# Patient Record
Sex: Male | Born: 1945 | Race: White | Hispanic: No | Smoking: Former smoker
Health system: Southern US, Community
[De-identification: ages and names within clinical notes are randomized; demographics above are authoritative.]

## PROBLEM LIST (undated history)

## (undated) DIAGNOSIS — I4891 Unspecified atrial fibrillation: Secondary | ICD-10-CM

## (undated) DIAGNOSIS — E785 Hyperlipidemia, unspecified: Secondary | ICD-10-CM

## (undated) DIAGNOSIS — H539 Unspecified visual disturbance: Secondary | ICD-10-CM

## (undated) DIAGNOSIS — Z951 Presence of aortocoronary bypass graft: Secondary | ICD-10-CM

## (undated) DIAGNOSIS — H349 Unspecified retinal vascular occlusion: Secondary | ICD-10-CM

## (undated) DIAGNOSIS — I69398 Other sequelae of cerebral infarction: Secondary | ICD-10-CM

## (undated) DIAGNOSIS — I5032 Chronic diastolic (congestive) heart failure: Secondary | ICD-10-CM

## (undated) DIAGNOSIS — I480 Paroxysmal atrial fibrillation: Secondary | ICD-10-CM

## (undated) DIAGNOSIS — I10 Essential (primary) hypertension: Secondary | ICD-10-CM

## (undated) DIAGNOSIS — E119 Type 2 diabetes mellitus without complications: Secondary | ICD-10-CM

## (undated) DIAGNOSIS — G4730 Sleep apnea, unspecified: Secondary | ICD-10-CM

## (undated) DIAGNOSIS — R42 Dizziness and giddiness: Secondary | ICD-10-CM

## (undated) DIAGNOSIS — Z87442 Personal history of urinary calculi: Secondary | ICD-10-CM

## (undated) DIAGNOSIS — Z972 Presence of dental prosthetic device (complete) (partial): Secondary | ICD-10-CM

## (undated) DIAGNOSIS — Z952 Presence of prosthetic heart valve: Secondary | ICD-10-CM

## (undated) DIAGNOSIS — I2510 Atherosclerotic heart disease of native coronary artery without angina pectoris: Secondary | ICD-10-CM

## (undated) HISTORY — PX: CARDIAC VALVE REPLACEMENT: SHX585

## (undated) HISTORY — DX: Paroxysmal atrial fibrillation: I48.0

## (undated) HISTORY — PX: APPENDECTOMY: SHX54

## (undated) HISTORY — PX: TONSILLECTOMY: SUR1361

## (undated) HISTORY — DX: Hyperlipidemia, unspecified: E78.5

## (undated) HISTORY — DX: Unspecified atrial fibrillation: I48.91

## (undated) HISTORY — DX: Chronic diastolic (congestive) heart failure: I50.32

## (undated) HISTORY — PX: BONE MARROW BIOPSY: SHX1253

---

## 2009-05-16 DIAGNOSIS — I10 Essential (primary) hypertension: Secondary | ICD-10-CM | POA: Insufficient documentation

## 2014-01-17 ENCOUNTER — Ambulatory Visit: Payer: Self-pay | Admitting: Family Medicine

## 2014-04-07 DIAGNOSIS — G4733 Obstructive sleep apnea (adult) (pediatric): Secondary | ICD-10-CM | POA: Insufficient documentation

## 2014-04-07 DIAGNOSIS — N400 Enlarged prostate without lower urinary tract symptoms: Secondary | ICD-10-CM | POA: Insufficient documentation

## 2015-03-28 DIAGNOSIS — E119 Type 2 diabetes mellitus without complications: Secondary | ICD-10-CM | POA: Insufficient documentation

## 2015-07-15 HISTORY — PX: CORONARY ARTERY BYPASS GRAFT: SHX141

## 2015-07-15 HISTORY — PX: AORTIC VALVE REPLACEMENT: SHX41

## 2015-07-31 DIAGNOSIS — I471 Supraventricular tachycardia: Secondary | ICD-10-CM | POA: Insufficient documentation

## 2016-02-11 DIAGNOSIS — I25118 Atherosclerotic heart disease of native coronary artery with other forms of angina pectoris: Secondary | ICD-10-CM | POA: Insufficient documentation

## 2016-08-07 DIAGNOSIS — Z952 Presence of prosthetic heart valve: Secondary | ICD-10-CM | POA: Insufficient documentation

## 2016-08-07 DIAGNOSIS — Z953 Presence of xenogenic heart valve: Secondary | ICD-10-CM | POA: Insufficient documentation

## 2016-08-07 DIAGNOSIS — Z951 Presence of aortocoronary bypass graft: Secondary | ICD-10-CM | POA: Insufficient documentation

## 2018-03-24 DIAGNOSIS — Z6838 Body mass index (BMI) 38.0-38.9, adult: Secondary | ICD-10-CM | POA: Insufficient documentation

## 2019-04-17 ENCOUNTER — Other Ambulatory Visit: Payer: Self-pay

## 2019-04-17 ENCOUNTER — Encounter: Payer: Self-pay | Admitting: Emergency Medicine

## 2019-04-17 ENCOUNTER — Emergency Department: Payer: Medicare HMO

## 2019-04-17 ENCOUNTER — Emergency Department
Admission: EM | Admit: 2019-04-17 | Discharge: 2019-04-17 | Disposition: A | Discharge status: Home / Self Care | Payer: Medicare HMO | Source: Home / Self Care | Attending: Emergency Medicine | Admitting: Emergency Medicine

## 2019-04-17 DIAGNOSIS — I2510 Atherosclerotic heart disease of native coronary artery without angina pectoris: Secondary | ICD-10-CM | POA: Insufficient documentation

## 2019-04-17 DIAGNOSIS — A4181 Sepsis due to Enterococcus: Secondary | ICD-10-CM | POA: Diagnosis not present

## 2019-04-17 DIAGNOSIS — Z79899 Other long term (current) drug therapy: Secondary | ICD-10-CM | POA: Insufficient documentation

## 2019-04-17 DIAGNOSIS — Z794 Long term (current) use of insulin: Secondary | ICD-10-CM | POA: Insufficient documentation

## 2019-04-17 DIAGNOSIS — R7881 Bacteremia: Secondary | ICD-10-CM | POA: Diagnosis not present

## 2019-04-17 DIAGNOSIS — Z7982 Long term (current) use of aspirin: Secondary | ICD-10-CM | POA: Insufficient documentation

## 2019-04-17 DIAGNOSIS — J111 Influenza due to unidentified influenza virus with other respiratory manifestations: Secondary | ICD-10-CM | POA: Insufficient documentation

## 2019-04-17 DIAGNOSIS — I10 Essential (primary) hypertension: Secondary | ICD-10-CM | POA: Insufficient documentation

## 2019-04-17 DIAGNOSIS — E119 Type 2 diabetes mellitus without complications: Secondary | ICD-10-CM | POA: Insufficient documentation

## 2019-04-17 DIAGNOSIS — Z20828 Contact with and (suspected) exposure to other viral communicable diseases: Secondary | ICD-10-CM | POA: Insufficient documentation

## 2019-04-17 HISTORY — DX: Essential (primary) hypertension: I10

## 2019-04-17 HISTORY — DX: Atherosclerotic heart disease of native coronary artery without angina pectoris: I25.10

## 2019-04-17 LAB — URINALYSIS, ROUTINE W REFLEX MICROSCOPIC
Bilirubin Urine: NEGATIVE
Glucose, UA: NEGATIVE mg/dL
Ketones, ur: NEGATIVE mg/dL
Nitrite: NEGATIVE
Protein, ur: 30 mg/dL — AB
Specific Gravity, Urine: 1.025 (ref 1.005–1.030)
WBC, UA: >50 WBC/hpf — ABNORMAL HIGH (ref 0–5)
pH: 5 (ref 5.0–8.0)

## 2019-04-17 LAB — CBC WITH DIFFERENTIAL/PLATELET
Abs Immature Granulocytes: 0.05 10*3/uL (ref 0.00–0.07)
Basophils Absolute: 0 10*3/uL (ref 0.0–0.1)
Basophils Relative: 1 %
Eosinophils Absolute: 0 10*3/uL (ref 0.0–0.5)
Eosinophils Relative: 0 %
HCT: 36 % — ABNORMAL LOW (ref 39.0–52.0)
Hemoglobin: 12 g/dL — ABNORMAL LOW (ref 13.0–17.0)
Immature Granulocytes: 1 %
Lymphocytes Relative: 26 %
Lymphs Abs: 1.7 10*3/uL (ref 0.7–4.0)
MCH: 34.2 pg — ABNORMAL HIGH (ref 26.0–34.0)
MCHC: 33.3 g/dL (ref 30.0–36.0)
MCV: 102.6 fL — ABNORMAL HIGH (ref 80.0–100.0)
Monocytes Absolute: 0.9 10*3/uL (ref 0.1–1.0)
Monocytes Relative: 15 %
Neutro Abs: 3.7 10*3/uL (ref 1.7–7.7)
Neutrophils Relative %: 57 %
Platelets: 152 10*3/uL (ref 150–400)
RBC: 3.51 MIL/uL — ABNORMAL LOW (ref 4.22–5.81)
RDW: 14.3 % (ref 11.5–15.5)
WBC: 6.4 10*3/uL (ref 4.0–10.5)
nRBC: 0 % (ref 0.0–0.2)

## 2019-04-17 LAB — PROTIME-INR
INR: 1.2 (ref 0.8–1.2)
Prothrombin Time: 14.6 seconds (ref 11.4–15.2)

## 2019-04-17 LAB — APTT: aPTT: 25 seconds (ref 24–36)

## 2019-04-17 LAB — COMPREHENSIVE METABOLIC PANEL
ALT: 33 U/L (ref 0–44)
AST: 40 U/L (ref 15–41)
Albumin: 3.6 g/dL (ref 3.5–5.0)
Alkaline Phosphatase: 49 U/L (ref 38–126)
Anion gap: 12 (ref 5–15)
BUN: 14 mg/dL (ref 8–23)
CO2: 22 mmol/L (ref 22–32)
Calcium: 8.6 mg/dL — ABNORMAL LOW (ref 8.9–10.3)
Chloride: 100 mmol/L (ref 98–111)
Creatinine, Ser: 1.02 mg/dL (ref 0.61–1.24)
GFR calc Af Amer: >60 mL/min (ref >60)
GFR calc non Af Amer: >60 mL/min (ref >60)
Glucose, Bld: 157 mg/dL — ABNORMAL HIGH (ref 70–99)
Potassium: 3.6 mmol/L (ref 3.5–5.1)
Sodium: 134 mmol/L — ABNORMAL LOW (ref 135–145)
Total Bilirubin: 0.9 mg/dL (ref 0.3–1.2)
Total Protein: 7.5 g/dL (ref 6.5–8.1)

## 2019-04-17 LAB — SARS CORONAVIRUS 2 BY RT PCR (HOSPITAL ORDER, PERFORMED IN ~~LOC~~ HOSPITAL LAB): SARS Coronavirus 2: NEGATIVE

## 2019-04-17 LAB — LACTIC ACID, PLASMA: Lactic Acid, Venous: 1.7 mmol/L (ref 0.5–1.9)

## 2019-04-17 MED ORDER — SODIUM CHLORIDE 0.9 % IV SOLN
2.0000 g | INTRAVENOUS | Status: DC
  Administered 2019-04-17: 2 g via INTRAVENOUS
  Filled 2019-04-17: qty 20

## 2019-04-17 MED ORDER — SODIUM CHLORIDE 0.9 % IV SOLN
500.0000 mg | INTRAVENOUS | Status: DC
  Administered 2019-04-17: 17:00:00 500 mg via INTRAVENOUS
  Filled 2019-04-17: qty 500

## 2019-04-17 MED ORDER — IOHEXOL 300 MG/ML  SOLN
125.0000 mL | Freq: Once | INTRAMUSCULAR | Status: AC | PRN
  Administered 2019-04-17: 20:00:00 125 mL via INTRAVENOUS

## 2019-04-17 MED ORDER — AZITHROMYCIN 250 MG PO TABS: ORAL_TABLET | ORAL | 0 refills | Status: DC

## 2019-04-17 NOTE — ED Triage Notes (Signed)
Pt presents from home via [facility] with c/o generalized weakness and fever for 3 days. Pt has non-productive cough. Pt sinus tach on monitor for ems at 125. temp 101.5 for ems. Pt alert and oriented x4 at this time.

## 2019-04-17 NOTE — Progress Notes (Signed)
CODE SEPSIS - PHARMACY COMMUNICATION  **Broad Spectrum Antibiotics should be administered within 1 hour of Sepsis diagnosis**  Time Code Sepsis Called/Page Received: 15:28  Antibiotics Ordered: Ceftriaxone and Azithromycin  Time of 1st antibiotic administration: Ceftriaxone given at 15:45  Additional action taken by pharmacy: n/a  If necessary, Name of Provider/Nurse Contacted: n/a    Vira Blanco ,PharmD Clinical Pharmacist  04/17/2019  4:20 PM

## 2019-04-17 NOTE — Progress Notes (Signed)
Notified bedside nurse of need to draw lactic acid and blood cultures.  

## 2019-04-17 NOTE — ED Provider Notes (Addendum)
[facility] Emergency Department Provider Note  ____________________________________________  Time seen: Approximately 4:48 PM  I have reviewed the triage vital signs and the nursing notes.   HISTORY  Chief Complaint Fever and Weakness    HPI Jonathan Hailey. is a 73 y.o. male with a history of hypertension diabetes and CAD who comes to the ED today complaining of generalized weakness, fever, shortness of breath, nonproductive cough that is gradual onset constant and worsening for the past 4 days.  Associated body aches and malaise and decreased appetite.  No known sick contacts, denies possible exposure to [place] but does go to church twice a week without wearing a mask.   EMS report a temperature of 101.5.   aspirin (ADULT LOW DOSE ASPIRIN) 81 MG tablet  Take 1 tablet (81 mg total) by mouth daily. 60 tablet  6 05/30/2014  Active  insulin syringe-needle U-100 (BD INSULIN SYRINGE ULTRA-FINE) 1/2 mL 30 gauge x 1/2" Syrg  Use to inject insulin twice daily. Please dispense 50 unit syringes. ICD-10 E11.9 100 each  7 04/02/2016  Active  ACCU-CHEK AVIVA PLUS METER Misc  CHECK BLOOD SUGAR THREE TIMES DAILY  1 each  0 01/08/2017  Active  ACCU-CHEK SOFTCLIX LANCETS lancets  Indications: Type 2 diabetes mellitus without complication, without long-term current use of insulin (CMS-HCC) TEST BLOOD SUGAR THREE TIMES DAILY 300 each  11 08/11/2017  Active  omeprazole (PRILOSEC) 20 MG capsule  Take 1 capsule (20 mg total) by mouth daily. 90 capsule  3 04/22/2018 04/22/2019 Active  econazole nitrate (SPECTAZOLE) 1 % cream  Indications: Tinea corporis Apply topically daily. Apply a thin layer twice a day to affected area in groin. Rub in totally. 85 g  6 05/03/2018  Active  nitroglycerin (NITROSTAT) 0.4 MG SL tablet  Place 1 tablet (0.4 mg total) under the tongue every five (5) minutes as needed for chest pain. 30 tablet  0 06/17/2018 06/17/2019 Active    atorvastatin (LIPITOR) 80 MG tablet  Take 1 tablet (80 mg total) by mouth daily. 90 tablet  1 08/13/2018  Active  loratadine (CLARITIN) 10 mg tablet  Indications: Tinea corporis Take 1 tablet (10 mg total) by mouth daily. 90 tablet  3 09/13/2018 09/13/2019 Active  isosorbide mononitrate (IMDUR) 30 MG 24 hr tablet  Take 1 tablet (30 mg total) by mouth daily. 90 tablet  3 09/13/2018 09/13/2019 Active  nystatin (MYCOSTATIN) 100,000 unit/gram powder  Apply 1 application topically.  0   Active  blood sugar diagnostic (ACCU-CHEK AVIVA PLUS TEST STRP) Strp  Test 3 times a day 300 strip  3 10/25/2018  Active  amLODIPine (NORVASC) 5 MG tablet  Take 1 tablet (5 mg total) by mouth daily. 30 tablet  8 12/08/2018  Active  metoprolol succinate (TOPROL-XL) 25 MG 24 hr tablet  Take 1 tablet (25 mg total) by mouth daily. 90 tablet  2 12/08/2018 09/04/2019 Active  lisinopriL (PRINIVIL,ZESTRIL) 40 MG tablet  Take 1 tablet (40 mg total) by mouth daily. 90 tablet  2 12/08/2018 09/04/2019 Active  clotrimazole-betamethasone (LOTRISONE) 1-0.05 % cream  Indications: Skin lesion of foot Apply to affected area 2 times daily for 14 days. 15 g  1 12/13/2018 12/13/2019 Active  insulin NPH-insulin regular, 70/30, (HUMULIN/NOVOLIN) 100 unit/mL (70-30) injection  Inject 85 units qam, 80u qpm 70 mL   12/20/2018  Active  empagliflozin (JARDIANCE) 10 mg tablet  Take 1 tablet (10 mg total) by mouth daily. 30 tablet  2 04/14/2019  Active  Hospital, Clinic, or Other Facility Administered Medication Ordered Dose Route Frequency Start Date End Date Status  nitroglycerin (NITROSTAT) SL tablet 0.4 mg  Indications: Chest pain, unspecified type 0.4 mg SL Once 03/24/2018  Active   Active Problems Reconcile with Patient's Chart Problem Noted Date  Class 2 severe obesity due to excess calories with serious comorbidity and body mass index (BMI) of 38.0 to 38.9 in adult 03/24/2018  Other chest pain 09/23/2017   Status post coronary artery bypass graft 08/07/2016  Overview:   From op note, 02/11/2016:  OPERATION PERFORMED:  1. Aortic valve replacement with a 23 mm bovine pericardial valve. 2. Coronary Artery Bypass Grafting x 2 (LIMA to LAD, RSVG to OM). 3. Endoscopic vein harvest of the left leg.  SURGEON: Shon Baton, MD.  ASSISSTANT SURGEON: Concepcion Living, MD (No qualified resident available).  PA ASSISTANT (Endovein) : Tatjana Misunina, PA-C (No qualified resident available).    Status post aortic valve replacement 08/07/2016  Overview:   Summer, 2017 (from op note)  OPERATION PERFORMED:  1. Aortic valve replacement with a 23 mm bovine pericardial valve. 2. Coronary Artery Bypass Grafting x 2 (LIMA to LAD, RSVG to OM). 3. Endoscopic vein harvest of the left leg.  SURGEON: Shon Baton, MD.  ASSISSTANT SURGEON: Concepcion Living, MD (No qualified resident available).  PA ASSISTANT (Endovein) : Tatjana Misunina, PA-C (No qualified resident available).    CAD (coronary artery disease) 02/11/2016  Overview:   Left heart catheterization 09/23/17 1. Significant 3-vesselcoronary artery disease. There is 80% stenosis of the ostial LAD and 70% stenosis of OM1. There is 50% stenosis of the proximal RPDA and 90% stenosis of a small caliber RPL branch.  2. Patent LIMA to LAD and SVG to OM   Tinea cruris 05/08/2015  Overview:   Continue as needed terbinafine    Type 2 diabetes mellitus without complication, with long-term current use of insulin 03/28/2015  Overview:   NPH-insulin 50 units BID   Parapelvic renal cyst 04/09/2014  Overview:   Continue regular medical appointments   BPH (benign prostatic hypertrophy) 04/07/2014  OSA (obstructive sleep apnea) 04/07/2014  Overview:   Continue CPAP and Continue regular medical appointments   Hypertension associated with diabetes 05/16/2009  Overview:   Lisinopril 10 mg daily Metoprolol 25 mg BID Amlodipine 5  mg daily     Past Medical History:  Diagnosis Date   Coronary artery disease    Hypertension   Diabetes   There are no active problems to display for this patient.    History reviewed. No pertinent surgical history.   Prior to Admission medications   Medication Sig Start Date End Date Taking? Authorizing Provider  amLODipine (NORVASC) 5 MG tablet Take 5 mg by mouth daily. 03/08/19  Yes [provider]  aspirin EC 81 MG tablet Take 81 mg by mouth daily. 05/30/14  Yes [provider]  atorvastatin (LIPITOR) 80 MG tablet Take 80 mg by mouth daily. 08/13/18  Yes [provider]  isosorbide mononitrate (IMDUR) 30 MG 24 hr tablet Take 30 mg by mouth daily. 09/13/18 09/13/19 Yes [provider]  lisinopril (ZESTRIL) 40 MG tablet Take 40 mg by mouth daily. 12/08/18 09/04/19 Yes [provider]  loratadine (CLARITIN) 10 MG tablet Take 10 mg by mouth daily. 09/13/18 09/13/19 Yes [provider]  metoprolol succinate (TOPROL-XL) 25 MG 24 hr tablet Take 25 mg by mouth daily. 12/08/18 09/04/19 Yes [provider]  nitroGLYCERIN (NITROSTAT) 0.4 MG SL tablet Place 0.4 mg under  the tongue as needed. 03/24/18 06/17/19 Yes [provider]  NOVOLIN 70/30 RELION (70-30) 100 UNIT/ML injection Inject 80-85 Units into the skin 2 (two) times daily. Inject 85 units subcutaneously every morning and 80 units every evening 03/26/19  Yes [provider]  omeprazole (PRILOSEC) 20 MG capsule Take 20 mg by mouth daily. 04/22/18 04/22/19 Yes [provider]  azithromycin (ZITHROMAX Z-PAK) 250 MG tablet Take 2 tablets (500 mg) on  Day 1,  followed by 1 tablet (250 mg) once daily on Days 2 through 5. 04/17/19   Carrie Mew, MD  empagliflozin (JARDIANCE) 10 MG TABS tablet Take 10 mg by mouth daily. 04/14/19   [provider]     Allergies Metformin   History reviewed. No pertinent family history.  Social History Social  History   Tobacco Use   Smoking status: Not on file  Substance Use Topics   Alcohol use: Not on file   Drug use: Not on file  No tobacco alcohol or drug use  Review of Systems  Constitutional: Positive fever and chills.  ENT:   No sore throat. No rhinorrhea. Cardiovascular:   No chest pain or syncope. Respiratory:   Positive shortness of breath and cough. Gastrointestinal:   Negative for abdominal pain, vomiting and diarrhea.  Musculoskeletal:   Negative for focal pain or swelling All other systems reviewed and are negative except as documented above in ROS and HPI.  ____________________________________________   PHYSICAL EXAM:  VITAL SIGNS: ED Triage Vitals  Enc Vitals Group     BP 04/17/19 1528 117/73     Pulse Rate 04/17/19 1528 (!) 117     Resp 04/17/19 1528 (!) 23     Temp 04/17/19 1531 98.6 F (37 C)     Temp Source 04/17/19 1531 Oral     SpO2 04/17/19 1528 94 %     Weight 04/17/19 1525 247 lb (112 kg)     Height 04/17/19 1525 5\' 9"  (1.753 m)     Head Circumference --      Peak Flow --      Pain Score 04/17/19 1525 5     Pain Loc --      Pain Edu? --      Excl. in [place]? --     Vital signs reviewed, nursing assessments reviewed.   Constitutional:   Alert and oriented. Non-toxic appearance. Eyes:   Conjunctivae are normal. EOMI. PERRL. ENT      Head:   Normocephalic and atraumatic.      Nose:   Wearing a mask.      Mouth/Throat:   Dry mucous membranes.      Neck:   No meningismus. Full ROM. Hematological/Lymphatic/Immunilogical:   No cervical lymphadenopathy. Cardiovascular:   Tachycardia heart rate 120. Symmetric bilateral radial and DP pulses.  No murmurs. Cap refill less than 2 seconds. Respiratory:   Tachypnea, normal work of breathing.  No crackles or wheezes, but there is inducible wheezing with FEV1 maneuver/cough. Gastrointestinal:   Soft and nontender. Non distended. There is no CVA tenderness.  No rebound, rigidity, or  guarding.  Musculoskeletal:   Normal range of motion in all extremities. No joint effusions.  No lower extremity tenderness.  No edema. Neurologic:   Normal speech and language.  Motor grossly intact. No acute focal neurologic deficits are appreciated.  Skin:    Skin is warm, dry and intact. No rash noted.  No petechiae, purpura, or bullae.  ____________________________________________    LABS (pertinent positives/negatives) (all  labs ordered are listed, but only abnormal results are displayed) Labs Reviewed  COMPREHENSIVE METABOLIC PANEL - Abnormal; Notable for the following components:      Result Value   Sodium 134 (*)    Glucose, Bld 157 (*)    Calcium 8.6 (*)    All other components within normal limits  CBC WITH DIFFERENTIAL/PLATELET - Abnormal; Notable for the following components:   RBC 3.51 (*)    Hemoglobin 12.0 (*)    HCT 36.0 (*)    MCV 102.6 (*)    MCH 34.2 (*)    All other components within normal limits  SARS CORONAVIRUS 2 (HOSPITAL ORDER, [place] [facility])  CULTURE, BLOOD (ROUTINE X 2)  CULTURE, BLOOD (ROUTINE X 2)  URINE CULTURE  LACTIC ACID, PLASMA  APTT  PROTIME-INR  URINALYSIS, ROUTINE W REFLEX MICROSCOPIC   ____________________________________________   EKG    ____________________________________________    RADIOLOGY  Ct Chest W Contrast  Result Date: 04/17/2019 CLINICAL DATA:  73 year old male with history of acute respiratory illness. Shortness of breath. Generalized weakness and fever. EXAM: CT CHEST, ABDOMEN, AND PELVIS WITH CONTRAST TECHNIQUE: Multidetector CT imaging of the chest, abdomen and pelvis was performed following the standard protocol during bolus administration of intravenous contrast. CONTRAST:  172mL OMNIPAQUE IOHEXOL 300 MG/ML  SOLN COMPARISON:  None. FINDINGS: CT CHEST FINDINGS Cardiovascular: Heart size is normal. There is no significant pericardial fluid, thickening or pericardial calcification. There  is aortic atherosclerosis, as well as atherosclerosis of the great vessels of the mediastinum and the coronary arteries, including calcified atherosclerotic plaque in the left main, left anterior descending, left circumflex and right coronary arteries. Status post median sternotomy for CABG including LIMA to the LAD, as well as aortic root replacement with a stented bioprosthesis. Mediastinum/Nodes: No pathologically enlarged mediastinal or hilar lymph nodes. Esophagus is unremarkable in appearance. No axillary lymphadenopathy. Lungs/Pleura: A few scattered tiny pulmonary nodules are noted in the lungs bilaterally, largest of which measures only 4 mm in the right lower lobe (axial image 97 of series 4), nonspecific. No larger more suspicious appearing pulmonary nodules or masses are noted. No acute consolidative airspace disease. No pleural effusions. Musculoskeletal: Median sternotomy wires. There are no aggressive appearing lytic or blastic lesions noted in the visualized portions of the skeleton. CT ABDOMEN PELVIS FINDINGS Hepatobiliary: 1.2 cm low-attenuation lesion in segment 4A of the liver, compatible with a simple cyst. Subcentimeter low-attenuation lesion in segment 2 of the liver, incompletely characterized on today's non-contrast CT examination, but statistically likely to represent a cyst. No other larger more suspicious appearing hepatic lesions. No intra or extrahepatic biliary ductal dilatation. Gallbladder is normal in appearance. Pancreas: No pancreatic mass. No pancreatic ductal dilatation. No pancreatic or peripancreatic fluid collections or inflammatory changes. Spleen: Unremarkable. Adrenals/Urinary Tract: Multiple right-sided renal calculi, largest of which is a staghorn calculus which extends into the renal pelvis measuring 4.0 x 2.1 x 3.9 cm (axial image 72 of series 2 and sagittal image 66 of series 6). This is associated with mild right hydronephrosis. No additional calculi are noted in the  collecting system of left kidney or along the course of either ureter. No hydroureter. 5.9 cm low-attenuation lesion in the interpolar region of the left kidney, compatible with a large peripelvic cyst. Urinary bladder is normal in appearance. Bilateral adrenal glands are normal in appearance. Stomach/Bowel: Normal appearance of the stomach. No pathologic dilatation of small bowel or colon. The appendix is not confidently identified and may be  surgically absent. Regardless, there are no inflammatory changes noted adjacent to the cecum to suggest the presence of an acute appendicitis at this time. Vascular/Lymphatic: Aortic atherosclerosis, without evidence of aneurysm or dissection in the abdominal or pelvic vasculature. No lymphadenopathy noted in the abdomen or pelvis. Reproductive: Prostate gland and seminal vesicles are unremarkable in appearance. Other: No significant volume of ascites.  No pneumoperitoneum. Musculoskeletal: There are no aggressive appearing lytic or blastic lesions noted in the visualized portions of the skeleton. IMPRESSION: 1. No acute findings are noted in the thorax to account for the patient's symptoms. 2. Large calculi in the right renal collecting system, largest of which is in the staghorn calculus which extends into the renal pelvis measuring 4.0 x 2.1 x 3.9 cm which is associated with mild right proximal hydronephrosis. 3. Small pulmonary nodules in the lungs bilaterally measuring 4 mm or less in size, nonspecific, but statistically likely benign. No follow-up needed if patient is low-risk (and has no known or suspected primary neoplasm). Non-contrast chest CT can be considered in 12 months if patient is high-risk. This recommendation follows the consensus statement: Guidelines for Management of Incidental Pulmonary Nodules Detected on CT Images: From the Fleischner Society 2017; Radiology 2017; 284:228-243. 4. Aortic atherosclerosis, in addition to left main and 3 vessel coronary  artery disease. Status post median sternotomy for CABG including LIMA to the LAD. 5. Additional incidental findings, as above. Electronically Signed   By: Vinnie Langton M.D.   On: 04/17/2019 20:21   Ct Abdomen Pelvis W Contrast  Result Date: 04/17/2019 CLINICAL DATA:  73 year old male with history of acute respiratory illness. Shortness of breath. Generalized weakness and fever. EXAM: CT CHEST, ABDOMEN, AND PELVIS WITH CONTRAST TECHNIQUE: Multidetector CT imaging of the chest, abdomen and pelvis was performed following the standard protocol during bolus administration of intravenous contrast. CONTRAST:  187mL OMNIPAQUE IOHEXOL 300 MG/ML  SOLN COMPARISON:  None. FINDINGS: CT CHEST FINDINGS Cardiovascular: Heart size is normal. There is no significant pericardial fluid, thickening or pericardial calcification. There is aortic atherosclerosis, as well as atherosclerosis of the great vessels of the mediastinum and the coronary arteries, including calcified atherosclerotic plaque in the left main, left anterior descending, left circumflex and right coronary arteries. Status post median sternotomy for CABG including LIMA to the LAD, as well as aortic root replacement with a stented bioprosthesis. Mediastinum/Nodes: No pathologically enlarged mediastinal or hilar lymph nodes. Esophagus is unremarkable in appearance. No axillary lymphadenopathy. Lungs/Pleura: A few scattered tiny pulmonary nodules are noted in the lungs bilaterally, largest of which measures only 4 mm in the right lower lobe (axial image 97 of series 4), nonspecific. No larger more suspicious appearing pulmonary nodules or masses are noted. No acute consolidative airspace disease. No pleural effusions. Musculoskeletal: Median sternotomy wires. There are no aggressive appearing lytic or blastic lesions noted in the visualized portions of the skeleton. CT ABDOMEN PELVIS FINDINGS Hepatobiliary: 1.2 cm low-attenuation lesion in segment 4A of the liver,  compatible with a simple cyst. Subcentimeter low-attenuation lesion in segment 2 of the liver, incompletely characterized on today's non-contrast CT examination, but statistically likely to represent a cyst. No other larger more suspicious appearing hepatic lesions. No intra or extrahepatic biliary ductal dilatation. Gallbladder is normal in appearance. Pancreas: No pancreatic mass. No pancreatic ductal dilatation. No pancreatic or peripancreatic fluid collections or inflammatory changes. Spleen: Unremarkable. Adrenals/Urinary Tract: Multiple right-sided renal calculi, largest of which is a staghorn calculus which extends into the renal pelvis measuring 4.0 x 2.1 x  3.9 cm (axial image 72 of series 2 and sagittal image 66 of series 6). This is associated with mild right hydronephrosis. No additional calculi are noted in the collecting system of left kidney or along the course of either ureter. No hydroureter. 5.9 cm low-attenuation lesion in the interpolar region of the left kidney, compatible with a large peripelvic cyst. Urinary bladder is normal in appearance. Bilateral adrenal glands are normal in appearance. Stomach/Bowel: Normal appearance of the stomach. No pathologic dilatation of small bowel or colon. The appendix is not confidently identified and may be surgically absent. Regardless, there are no inflammatory changes noted adjacent to the cecum to suggest the presence of an acute appendicitis at this time. Vascular/Lymphatic: Aortic atherosclerosis, without evidence of aneurysm or dissection in the abdominal or pelvic vasculature. No lymphadenopathy noted in the abdomen or pelvis. Reproductive: Prostate gland and seminal vesicles are unremarkable in appearance. Other: No significant volume of ascites.  No pneumoperitoneum. Musculoskeletal: There are no aggressive appearing lytic or blastic lesions noted in the visualized portions of the skeleton. IMPRESSION: 1. No acute findings are noted in the thorax to  account for the patient's symptoms. 2. Large calculi in the right renal collecting system, largest of which is in the staghorn calculus which extends into the renal pelvis measuring 4.0 x 2.1 x 3.9 cm which is associated with mild right proximal hydronephrosis. 3. Small pulmonary nodules in the lungs bilaterally measuring 4 mm or less in size, nonspecific, but statistically likely benign. No follow-up needed if patient is low-risk (and has no known or suspected primary neoplasm). Non-contrast chest CT can be considered in 12 months if patient is high-risk. This recommendation follows the consensus statement: Guidelines for Management of Incidental Pulmonary Nodules Detected on CT Images: From the Fleischner Society 2017; Radiology 2017; 284:228-243. 4. Aortic atherosclerosis, in addition to left main and 3 vessel coronary artery disease. Status post median sternotomy for CABG including LIMA to the LAD. 5. Additional incidental findings, as above. Electronically Signed   By: Vinnie Langton M.D.   On: 04/17/2019 20:21   Dg Chest Port 1 View  Result Date: 04/17/2019 CLINICAL DATA:  Fever, cough EXAM: PORTABLE CHEST 1 VIEW COMPARISON:  None. FINDINGS: Cardiomegaly status post median sternotomy and CABG. Both lungs are clear. The visualized skeletal structures are unremarkable. IMPRESSION: Cardiomegaly without acute abnormality of the lungs in AP portable projection. Electronically Signed   By: Eddie Candle M.D.   On: 04/17/2019 16:20    ____________________________________________   PROCEDURES Procedures  ____________________________________________  DIFFERENTIAL DIAGNOSIS   Pneumonia, COVID-19, other viral syndrome, UTI.  Doubt occult intra-abdominal pathology.  No evidence of skin or soft tissue infection  CLINICAL IMPRESSION / ASSESSMENT AND PLAN / ED COURSE  Medications ordered in the ED: Medications  cefTRIAXone (ROCEPHIN) 2 g in sodium chloride 0.9 % 100 mL IVPB (0 g Intravenous Stopped  04/17/19 1633)  azithromycin (ZITHROMAX) 500 mg in sodium chloride 0.9 % 250 mL IVPB (0 mg Intravenous Stopped 04/17/19 1758)  iohexol (OMNIPAQUE) 300 MG/ML solution 125 mL (125 mLs Intravenous Contrast Given 04/17/19 1947)    Pertinent labs & imaging results that were available during my care of the patient were reviewed by me and considered in my medical decision making (see chart for details).  Jonathan Biagini. was evaluated in Emergency Department on 04/17/2019 for the symptoms described in the history of present illness. He was evaluated in the context of the global COVID-19 pandemic, which necessitated consideration that the patient might  be at risk for infection with the SARS-CoV-2 virus that causes COVID-19. Institutional protocols and algorithms that pertain to the evaluation of patients at risk for COVID-19 are in a state of rapid change based on information released by regulatory bodies including the CDC and federal and state organizations. These policies and algorithms were followed during the patient's care in the ED.     Clinical Course as of Apr 17 2203  Nancy Fetter Apr 17, 2019  1616 Patient presents with fever tachycardia tachypnea.  Blood pressure is normal.  Presentation concerning for pneumonia versus COVID-19, possible sepsis.  Code sepsis initiated on this initial assessment, empiric antibiotics with ceftriaxone and azithromycin.   [PS]  [id number] COVID negative. Will obtain CT chest, abdomen, pelvis to eval for occult disease given SIRS and negative CXR with diabetes, obesity, diabetic neuropathy.   [PS]  2159 Work-up negative and reassuring.  Patient is feeling better.  Vital signs are normalized after IV fluids for hydration.  No fever in the ED.  I just checked and is 98.5 despite the lack of antipyretics over the last 6 hours in the ED.  Patient is eager to go home.  I will continue him on azithromycin and have him follow-up with his doctor tomorrow to continue evaluating.  Return  precautions discussed for any worsening symptoms.   [PS]    Clinical Course User Index [PS] Carrie Mew, MD     ----------------------------------------- 10:04 PM on 04/17/2019 -----------------------------------------  Patient urinated approximately 300 cc.  Will send to lab for UA culture  ____________________________________________   FINAL CLINICAL IMPRESSION(S) / ED DIAGNOSES    Final diagnoses:  Influenza-like illness     ED Discharge Orders         Ordered    azithromycin (ZITHROMAX Z-PAK) 250 MG tablet     04/17/19 2202          Portions of this note were generated with dragon dictation software. Dictation errors may occur despite best attempts at proofreading.   Carrie Mew, MD 04/17/19 2204    Carrie Mew, MD 04/17/19 2204

## 2019-04-17 NOTE — Discharge Instructions (Signed)
Your lab tests and CT scans were all okay today.  Your COVID test was negative.  Is possible that you are in the early stages of developing a pneumonia, so continue taking azithromycin to protect against this.  Call your doctor tomorrow for a follow-up appointment to continue monitoring your symptoms.  If your condition worsens please return to the emergency room for repeat evaluation.

## 2019-04-18 ENCOUNTER — Emergency Department: Payer: Medicare HMO

## 2019-04-18 ENCOUNTER — Telehealth: Payer: Self-pay | Admitting: Emergency Medicine

## 2019-04-18 ENCOUNTER — Inpatient Hospital Stay
Admission: EM | Admit: 2019-04-18 | Discharge: 2019-04-23 | DRG: 872 | Disposition: A | Discharge status: Home Health | Payer: Medicare HMO | Attending: Internal Medicine | Admitting: Internal Medicine

## 2019-04-18 DIAGNOSIS — Z8249 Family history of ischemic heart disease and other diseases of the circulatory system: Secondary | ICD-10-CM

## 2019-04-18 DIAGNOSIS — I471 Supraventricular tachycardia: Secondary | ICD-10-CM | POA: Diagnosis present

## 2019-04-18 DIAGNOSIS — Z7982 Long term (current) use of aspirin: Secondary | ICD-10-CM

## 2019-04-18 DIAGNOSIS — Z888 Allergy status to other drugs, medicaments and biological substances status: Secondary | ICD-10-CM | POA: Diagnosis not present

## 2019-04-18 DIAGNOSIS — I2510 Atherosclerotic heart disease of native coronary artery without angina pectoris: Secondary | ICD-10-CM

## 2019-04-18 DIAGNOSIS — Z6836 Body mass index (BMI) 36.0-36.9, adult: Secondary | ICD-10-CM | POA: Diagnosis not present

## 2019-04-18 DIAGNOSIS — Z87442 Personal history of urinary calculi: Secondary | ICD-10-CM

## 2019-04-18 DIAGNOSIS — B952 Enterococcus as the cause of diseases classified elsewhere: Secondary | ICD-10-CM

## 2019-04-18 DIAGNOSIS — E669 Obesity, unspecified: Secondary | ICD-10-CM | POA: Diagnosis present

## 2019-04-18 DIAGNOSIS — R197 Diarrhea, unspecified: Secondary | ICD-10-CM | POA: Diagnosis present

## 2019-04-18 DIAGNOSIS — R0902 Hypoxemia: Secondary | ICD-10-CM | POA: Diagnosis not present

## 2019-04-18 DIAGNOSIS — Z823 Family history of stroke: Secondary | ICD-10-CM

## 2019-04-18 DIAGNOSIS — Z794 Long term (current) use of insulin: Secondary | ICD-10-CM | POA: Diagnosis not present

## 2019-04-18 DIAGNOSIS — E119 Type 2 diabetes mellitus without complications: Secondary | ICD-10-CM

## 2019-04-18 DIAGNOSIS — N132 Hydronephrosis with renal and ureteral calculous obstruction: Secondary | ICD-10-CM | POA: Diagnosis not present

## 2019-04-18 DIAGNOSIS — N200 Calculus of kidney: Secondary | ICD-10-CM

## 2019-04-18 DIAGNOSIS — B354 Tinea corporis: Secondary | ICD-10-CM | POA: Diagnosis present

## 2019-04-18 DIAGNOSIS — Z79899 Other long term (current) drug therapy: Secondary | ICD-10-CM | POA: Diagnosis not present

## 2019-04-18 DIAGNOSIS — Z20828 Contact with and (suspected) exposure to other viral communicable diseases: Secondary | ICD-10-CM | POA: Diagnosis present

## 2019-04-18 DIAGNOSIS — Z951 Presence of aortocoronary bypass graft: Secondary | ICD-10-CM | POA: Diagnosis not present

## 2019-04-18 DIAGNOSIS — Z952 Presence of prosthetic heart valve: Secondary | ICD-10-CM | POA: Diagnosis not present

## 2019-04-18 DIAGNOSIS — N390 Urinary tract infection, site not specified: Secondary | ICD-10-CM | POA: Diagnosis not present

## 2019-04-18 DIAGNOSIS — N136 Pyonephrosis: Secondary | ICD-10-CM | POA: Diagnosis present

## 2019-04-18 DIAGNOSIS — E782 Mixed hyperlipidemia: Secondary | ICD-10-CM | POA: Diagnosis present

## 2019-04-18 DIAGNOSIS — A4181 Sepsis due to Enterococcus: Principal | ICD-10-CM | POA: Diagnosis present

## 2019-04-18 DIAGNOSIS — I152 Hypertension secondary to endocrine disorders: Secondary | ICD-10-CM | POA: Diagnosis present

## 2019-04-18 DIAGNOSIS — I340 Nonrheumatic mitral (valve) insufficiency: Secondary | ICD-10-CM | POA: Diagnosis not present

## 2019-04-18 DIAGNOSIS — I350 Nonrheumatic aortic (valve) stenosis: Secondary | ICD-10-CM | POA: Diagnosis present

## 2019-04-18 DIAGNOSIS — R7881 Bacteremia: Secondary | ICD-10-CM | POA: Diagnosis present

## 2019-04-18 DIAGNOSIS — G4733 Obstructive sleep apnea (adult) (pediatric): Secondary | ICD-10-CM | POA: Diagnosis present

## 2019-04-18 DIAGNOSIS — Z833 Family history of diabetes mellitus: Secondary | ICD-10-CM

## 2019-04-18 DIAGNOSIS — E1169 Type 2 diabetes mellitus with other specified complication: Secondary | ICD-10-CM | POA: Diagnosis present

## 2019-04-18 DIAGNOSIS — N3000 Acute cystitis without hematuria: Secondary | ICD-10-CM

## 2019-04-18 DIAGNOSIS — K219 Gastro-esophageal reflux disease without esophagitis: Secondary | ICD-10-CM | POA: Diagnosis present

## 2019-04-18 DIAGNOSIS — Z953 Presence of xenogenic heart valve: Secondary | ICD-10-CM

## 2019-04-18 HISTORY — DX: Presence of prosthetic heart valve: Z95.2

## 2019-04-18 HISTORY — DX: Type 2 diabetes mellitus without complications: E11.9

## 2019-04-18 HISTORY — DX: Presence of aortocoronary bypass graft: Z95.1

## 2019-04-18 LAB — CBC WITH DIFFERENTIAL/PLATELET
Abs Immature Granulocytes: 0.02 10*3/uL (ref 0.00–0.07)
Basophils Absolute: 0 10*3/uL (ref 0.0–0.1)
Basophils Relative: 0 %
Eosinophils Absolute: 0 10*3/uL (ref 0.0–0.5)
Eosinophils Relative: 1 %
HCT: 32.8 % — ABNORMAL LOW (ref 39.0–52.0)
Hemoglobin: 10.8 g/dL — ABNORMAL LOW (ref 13.0–17.0)
Immature Granulocytes: 0 %
Lymphocytes Relative: 29 %
Lymphs Abs: 1.5 10*3/uL (ref 0.7–4.0)
MCH: 33.8 pg (ref 26.0–34.0)
MCHC: 32.9 g/dL (ref 30.0–36.0)
MCV: 102.5 fL — ABNORMAL HIGH (ref 80.0–100.0)
Monocytes Absolute: 0.7 10*3/uL (ref 0.1–1.0)
Monocytes Relative: 14 %
Neutro Abs: 2.8 10*3/uL (ref 1.7–7.7)
Neutrophils Relative %: 56 %
Platelets: 127 10*3/uL — ABNORMAL LOW (ref 150–400)
RBC: 3.2 MIL/uL — ABNORMAL LOW (ref 4.22–5.81)
RDW: 14.3 % (ref 11.5–15.5)
WBC: 5.1 10*3/uL (ref 4.0–10.5)
nRBC: 0 % (ref 0.0–0.2)

## 2019-04-18 LAB — BLOOD CULTURE ID PANEL (REFLEXED)

## 2019-04-18 LAB — URINALYSIS, COMPLETE (UACMP) WITH MICROSCOPIC
Bacteria, UA: NONE SEEN
Bilirubin Urine: NEGATIVE
Glucose, UA: NEGATIVE mg/dL
Ketones, ur: NEGATIVE mg/dL
Nitrite: NEGATIVE
Protein, ur: 30 mg/dL — AB
Specific Gravity, Urine: 1.017 (ref 1.005–1.030)
pH: 5 (ref 5.0–8.0)

## 2019-04-18 LAB — COMPREHENSIVE METABOLIC PANEL
ALT: 39 U/L (ref 0–44)
AST: 50 U/L — ABNORMAL HIGH (ref 15–41)
Albumin: 3.1 g/dL — ABNORMAL LOW (ref 3.5–5.0)
Alkaline Phosphatase: 45 U/L (ref 38–126)
Anion gap: 11 (ref 5–15)
BUN: 14 mg/dL (ref 8–23)
CO2: 22 mmol/L (ref 22–32)
Calcium: 8.2 mg/dL — ABNORMAL LOW (ref 8.9–10.3)
Chloride: 97 mmol/L — ABNORMAL LOW (ref 98–111)
Creatinine, Ser: 0.83 mg/dL (ref 0.61–1.24)
GFR calc Af Amer: >60 mL/min (ref >60)
GFR calc non Af Amer: >60 mL/min (ref >60)
Glucose, Bld: 183 mg/dL — ABNORMAL HIGH (ref 70–99)
Potassium: 3.4 mmol/L — ABNORMAL LOW (ref 3.5–5.1)
Sodium: 130 mmol/L — ABNORMAL LOW (ref 135–145)
Total Bilirubin: 0.9 mg/dL (ref 0.3–1.2)
Total Protein: 6.9 g/dL (ref 6.5–8.1)

## 2019-04-18 LAB — GLUCOSE, CAPILLARY
Glucose-Capillary: 94 mg/dL (ref 70–99)
Glucose-Capillary: 147 mg/dL — ABNORMAL HIGH (ref 70–99)

## 2019-04-18 LAB — HEMOGLOBIN A1C
Hgb A1c MFr Bld: 9.6 % — ABNORMAL HIGH (ref 4.8–5.6)
Mean Plasma Glucose: 228.82 mg/dL

## 2019-04-18 LAB — INFLUENZA PANEL BY PCR (TYPE A & B)
Influenza A By PCR: NEGATIVE
Influenza B By PCR: NEGATIVE

## 2019-04-18 LAB — LACTIC ACID, PLASMA: Lactic Acid, Venous: 1.5 mmol/L (ref 0.5–1.9)

## 2019-04-18 LAB — SARS CORONAVIRUS 2 BY RT PCR (HOSPITAL ORDER, PERFORMED IN ~~LOC~~ HOSPITAL LAB): SARS Coronavirus 2: NEGATIVE

## 2019-04-18 MED ORDER — ALBUTEROL SULFATE (2.5 MG/3ML) 0.083% IN NEBU: 2.5000 mg | INHALATION_SOLUTION | RESPIRATORY_TRACT | Status: DC | PRN

## 2019-04-18 MED ORDER — NITROGLYCERIN 0.4 MG SL SUBL
0.4000 mg | SUBLINGUAL_TABLET | SUBLINGUAL | Status: DC | PRN
  Administered 2019-04-18 – 2019-04-19 (×2): 0.4 mg via SUBLINGUAL
  Filled 2019-04-18 (×2): qty 1

## 2019-04-18 MED ORDER — ONDANSETRON HCL 4 MG/2ML IJ SOLN: 4.0000 mg | Freq: Four times a day (QID) | INTRAMUSCULAR | Status: DC | PRN

## 2019-04-18 MED ORDER — LORATADINE 10 MG PO TABS
10.0000 mg | ORAL_TABLET | Freq: Every day | ORAL | Status: DC
  Administered 2019-04-19 – 2019-04-23 (×5): 10 mg via ORAL
  Filled 2019-04-18 (×5): qty 1

## 2019-04-18 MED ORDER — ONDANSETRON HCL 4 MG PO TABS: 4.0000 mg | ORAL_TABLET | Freq: Four times a day (QID) | ORAL | Status: DC | PRN

## 2019-04-18 MED ORDER — ATORVASTATIN CALCIUM 20 MG PO TABS
80.0000 mg | ORAL_TABLET | Freq: Every day | ORAL | Status: DC
  Administered 2019-04-18 – 2019-04-22 (×5): 80 mg via ORAL
  Filled 2019-04-18 (×5): qty 4

## 2019-04-18 MED ORDER — METOPROLOL SUCCINATE ER 25 MG PO TB24
25.0000 mg | ORAL_TABLET | Freq: Every day | ORAL | Status: DC
  Administered 2019-04-18 – 2019-04-23 (×6): 25 mg via ORAL
  Filled 2019-04-18 (×7): qty 1

## 2019-04-18 MED ORDER — INSULIN ASPART 100 UNIT/ML ~~LOC~~ SOLN
0.0000 [IU] | Freq: Every day | SUBCUTANEOUS | Status: DC
  Administered 2019-04-19 – 2019-04-20 (×2): 2 [IU] via SUBCUTANEOUS
  Filled 2019-04-18 (×2): qty 1

## 2019-04-18 MED ORDER — ISOSORBIDE MONONITRATE ER 30 MG PO TB24
30.0000 mg | ORAL_TABLET | Freq: Every day | ORAL | Status: DC
  Administered 2019-04-19 – 2019-04-23 (×5): 30 mg via ORAL
  Filled 2019-04-18 (×6): qty 1

## 2019-04-18 MED ORDER — SODIUM CHLORIDE 0.9 % IV SOLN
2.0000 g | INTRAVENOUS | Status: DC
  Administered 2019-04-18 – 2019-04-23 (×28): 2 g via INTRAVENOUS
  Filled 2019-04-18: qty 2000
  Filled 2019-04-18 (×2): qty 2
  Filled 2019-04-18 (×2): qty 2000
  Filled 2019-04-18: qty 2
  Filled 2019-04-18: qty 2000
  Filled 2019-04-18 (×4): qty 2
  Filled 2019-04-18: qty 2000
  Filled 2019-04-18 (×2): qty 2
  Filled 2019-04-18 (×2): qty 2000
  Filled 2019-04-18: qty 2
  Filled 2019-04-18: qty 2000
  Filled 2019-04-18 (×2): qty 2
  Filled 2019-04-18: qty 2000
  Filled 2019-04-18: qty 2
  Filled 2019-04-18 (×2): qty 2000
  Filled 2019-04-18 (×8): qty 2
  Filled 2019-04-18 (×3): qty 2000

## 2019-04-18 MED ORDER — POTASSIUM CHLORIDE IN NACL 20-0.9 MEQ/L-% IV SOLN
INTRAVENOUS | Status: AC
  Administered 2019-04-18: 15:00:00 via INTRAVENOUS
  Filled 2019-04-18: qty 1000

## 2019-04-18 MED ORDER — VANCOMYCIN HCL IN DEXTROSE 1-5 GM/200ML-% IV SOLN: 1000.0000 mg | Freq: Two times a day (BID) | INTRAVENOUS | Status: DC

## 2019-04-18 MED ORDER — INSULIN ASPART 100 UNIT/ML ~~LOC~~ SOLN
0.0000 [IU] | Freq: Three times a day (TID) | SUBCUTANEOUS | Status: DC
  Administered 2019-04-19: 2 [IU] via SUBCUTANEOUS
  Administered 2019-04-19 (×2): 3 [IU] via SUBCUTANEOUS
  Administered 2019-04-20: 5 [IU] via SUBCUTANEOUS
  Administered 2019-04-20 (×2): 3 [IU] via SUBCUTANEOUS
  Administered 2019-04-21: 12:00:00 5 [IU] via SUBCUTANEOUS
  Administered 2019-04-21: 3 [IU] via SUBCUTANEOUS
  Administered 2019-04-21: 17:00:00 2 [IU] via SUBCUTANEOUS
  Administered 2019-04-22: 12:00:00 5 [IU] via SUBCUTANEOUS
  Administered 2019-04-22: 2 [IU] via SUBCUTANEOUS
  Administered 2019-04-22: 3 [IU] via SUBCUTANEOUS
  Administered 2019-04-23: 2 [IU] via SUBCUTANEOUS
  Filled 2019-04-18 (×13): qty 1

## 2019-04-18 MED ORDER — SODIUM CHLORIDE 0.9 % IV SOLN: 2.0000 g | INTRAVENOUS | Status: DC

## 2019-04-18 MED ORDER — VANCOMYCIN HCL 10 G IV SOLR: 1000.0000 mg | Freq: Once | INTRAVENOUS | Status: DC

## 2019-04-18 MED ORDER — SODIUM CHLORIDE 0.9 % IV SOLN
2.0000 g | Freq: Once | INTRAVENOUS | Status: AC
  Administered 2019-04-18: 2 g via INTRAVENOUS
  Filled 2019-04-18: qty 2000

## 2019-04-18 MED ORDER — INSULIN ASPART PROT & ASPART (70-30 MIX) 100 UNIT/ML ~~LOC~~ SUSP
60.0000 [IU] | Freq: Two times a day (BID) | SUBCUTANEOUS | Status: DC
  Filled 2019-04-18: qty 10

## 2019-04-18 MED ORDER — VANCOMYCIN HCL 1.5 G IV SOLR
1500.0000 mg | Freq: Once | INTRAVENOUS | Status: DC
  Filled 2019-04-18: qty 1500

## 2019-04-18 MED ORDER — ACETAMINOPHEN 650 MG RE SUPP: 650.0000 mg | Freq: Four times a day (QID) | RECTAL | Status: DC | PRN

## 2019-04-18 MED ORDER — LACTATED RINGERS IV BOLUS
1000.0000 mL | Freq: Once | INTRAVENOUS | Status: AC
  Administered 2019-04-18: 1000 mL via INTRAVENOUS

## 2019-04-18 MED ORDER — PANTOPRAZOLE SODIUM 40 MG PO TBEC
40.0000 mg | DELAYED_RELEASE_TABLET | Freq: Every day | ORAL | Status: DC
  Administered 2019-04-19 – 2019-04-23 (×5): 40 mg via ORAL
  Filled 2019-04-18 (×5): qty 1

## 2019-04-18 MED ORDER — ENOXAPARIN SODIUM 40 MG/0.4ML ~~LOC~~ SOLN
40.0000 mg | SUBCUTANEOUS | Status: DC
  Administered 2019-04-18 – 2019-04-22 (×5): 40 mg via SUBCUTANEOUS
  Filled 2019-04-18 (×5): qty 0.4

## 2019-04-18 MED ORDER — ASPIRIN EC 81 MG PO TBEC
81.0000 mg | DELAYED_RELEASE_TABLET | Freq: Every day | ORAL | Status: DC
  Administered 2019-04-19 – 2019-04-23 (×5): 81 mg via ORAL
  Filled 2019-04-18 (×5): qty 1

## 2019-04-18 MED ORDER — ACETAMINOPHEN 325 MG PO TABS: 650.0000 mg | ORAL_TABLET | Freq: Four times a day (QID) | ORAL | Status: DC | PRN

## 2019-04-18 MED ORDER — VANCOMYCIN HCL IN DEXTROSE 1-5 GM/200ML-% IV SOLN
1000.0000 mg | Freq: Once | INTRAVENOUS | Status: AC
  Administered 2019-04-18: 1000 mg via INTRAVENOUS
  Filled 2019-04-18: qty 200

## 2019-04-18 MED ORDER — VANCOMYCIN HCL 10 G IV SOLR
1500.0000 mg | Freq: Once | INTRAVENOUS | Status: DC
  Filled 2019-04-18: qty 1500

## 2019-04-18 NOTE — Progress Notes (Signed)
Advance care planning  Purpose of Encounter Bacteremia, UTI  Parties in Attendance Patient, son at bedside  Patients Decisional capacity Alert and oriented.  Able to make medical decisions.  Patient has no designated medical power of attorney.  But he tells me wife would make decisions if he is unable to.  No living will in place  Discussed in detail regarding bacteremia, UTI.  Treatment plan , prognosis discussed.  All questions answered.  CODE STATUS discussed.  Patient requests aggressive medical care with CPR/defibrillation/intubation if needed.  Orders entered and CODE STATUS changed.  FULL CODE  Time spent - 17 minutes

## 2019-04-18 NOTE — ED Notes (Signed)
Report called to cierra rn floor nurse 

## 2019-04-18 NOTE — ED Provider Notes (Signed)
[facility] Emergency Department Provider Note   ____________________________________________   First MD Initiated Contact with Patient 04/18/19 [phone]     (approximate)  I have reviewed the triage vital signs and the nursing notes.   HISTORY  Chief Complaint Abnormal Labs and Diarrhea    HPI Jonathan Cross. is a 73 y.o. male with past medical history of CAD and hypertension who presents to the ED complaining of malaise.  Patient reports he has had approximately 5 days of generalized weakness, intermittent fever, cough, and shortness of breath.  He was initially evaluated for this problem in the ED yesterday, COVID testing was negative, however there was concern for pneumonia and he was started on azithromycin after receiving a dose of Rocephin.  He states he has continued to feel badly since then, with nonbloody diarrhea and subjective fevers.  He denies any chest pain and shortness of breath is about the same as yesterday.  He does report some dysuria, but denies any hematuria or flank pain.        Past Medical History:  Diagnosis Date  . Coronary artery disease   . Diabetes ([place])   . H/O aortic valve replacement   . Hx of CABG   . Hypertension     Patient Active Problem List   Diagnosis Date Noted  . Bacteremia 04/18/2019    History reviewed. No pertinent surgical history.  Prior to Admission medications   Medication Sig Start Date End Date Taking? Authorizing Provider  amLODipine (NORVASC) 5 MG tablet Take 5 mg by mouth daily. 03/08/19  Yes [provider]  aspirin EC 81 MG tablet Take 81 mg by mouth daily. 05/30/14  Yes [provider]  atorvastatin (LIPITOR) 80 MG tablet Take 80 mg by mouth daily. 08/13/18  Yes [provider]  azithromycin (ZITHROMAX Z-PAK) 250 MG tablet Take 2 tablets (500 mg) on  Day 1,  followed by 1 tablet (250 mg) once daily on Days 2 through 5. 04/17/19  Yes Carrie Mew, MD   isosorbide mononitrate (IMDUR) 30 MG 24 hr tablet Take 30 mg by mouth daily. 09/13/18 09/13/19 Yes [provider]  lisinopril (ZESTRIL) 40 MG tablet Take 40 mg by mouth daily. 12/08/18 09/04/19 Yes [provider]  loratadine (CLARITIN) 10 MG tablet Take 10 mg by mouth daily. 09/13/18 09/13/19 Yes [provider]  metoprolol succinate (TOPROL-XL) 25 MG 24 hr tablet Take 25 mg by mouth daily. 12/08/18 09/04/19 Yes [provider]  nitroGLYCERIN (NITROSTAT) 0.4 MG SL tablet Place 0.4 mg under the tongue as needed. 03/24/18 06/17/19 Yes [provider]  NOVOLIN 70/30 RELION (70-30) 100 UNIT/ML injection Inject 80-85 Units into the skin 2 (two) times daily. Inject 85 units subcutaneously every morning and 80 units every evening 03/26/19  Yes [provider]  omeprazole (PRILOSEC) 20 MG capsule Take 20 mg by mouth daily. 04/22/18 04/22/19 Yes [provider]    Allergies Metformin  Family History  Problem Relation Age of Onset  . CAD Father     Social History Social History   Tobacco Use  . Smoking status: Not on file  Substance Use Topics  . Alcohol use: Not on file  . Drug use: Not on file    Review of Systems  Constitutional: Positive for subjective fevers, chills, malaise Eyes: No visual changes. ENT: No sore throat. Cardiovascular: Denies chest pain. Respiratory: Positive for cough and shortness of breath. Gastrointestinal: No abdominal pain.  No nausea, no vomiting.  No diarrhea.  No constipation. Genitourinary: Positive for dysuria. Musculoskeletal: Negative for back pain. Skin: Negative for rash. Neurological: Negative for headaches, focal weakness or numbness.  ____________________________________________   PHYSICAL EXAM:  VITAL SIGNS: ED Triage Vitals [04/18/19 0940]  Enc Vitals Group     BP      Pulse      Resp      Temp      Temp src      SpO2      Weight 247 lb (112 kg)     Height 5\' 9"  (1.753 m)     Head  Circumference      Peak Flow      Pain Score      Pain Loc      Pain Edu?      Excl. in [place]?     Constitutional: Alert and oriented. Eyes: Conjunctivae are normal. Head: Atraumatic. Nose: No congestion/rhinnorhea. Mouth/Throat: Mucous membranes are moist. Neck: Normal ROM Cardiovascular: Normal rate, regular rhythm. Grossly normal heart sounds. Respiratory: Normal respiratory effort.  No retractions. Lungs CTAB. Gastrointestinal: Soft and nontender. No distention.  No CVA tenderness bilaterally. Genitourinary: deferred Musculoskeletal: No lower extremity tenderness nor edema. Neurologic:  Normal speech and language. No gross focal neurologic deficits are appreciated. Skin:  Skin is warm, dry and intact. No rash noted. Psychiatric: Mood and affect are normal. Speech and behavior are normal.  ____________________________________________   LABS (all labs ordered are listed, but only abnormal results are displayed)  Labs Reviewed  URINALYSIS, COMPLETE (UACMP) WITH MICROSCOPIC - Abnormal; Notable for the following components:      Result Value   Color, Urine YELLOW (*)    APPearance HAZY (*)    Hgb urine dipstick SMALL (*)    Protein, ur 30 (*)    Leukocytes,Ua SMALL (*)    All other components within normal limits  COMPREHENSIVE METABOLIC PANEL - Abnormal; Notable for the following components:   Sodium 130 (*)    Potassium 3.4 (*)    Chloride 97 (*)    Glucose, Bld 183 (*)    Calcium 8.2 (*)    Albumin 3.1 (*)    AST 50 (*)    All other components within normal limits  CBC WITH DIFFERENTIAL/PLATELET - Abnormal; Notable for the following components:   RBC 3.20 (*)    Hemoglobin 10.8 (*)    HCT 32.8 (*)    MCV 102.5 (*)    Platelets 127 (*)    All other components within normal limits  SARS CORONAVIRUS 2 (HOSPITAL ORDER, [facility])  CULTURE, BLOOD (ROUTINE X 2)  CULTURE, BLOOD (ROUTINE X 2)  URINE CULTURE  C DIFFICILE QUICK SCREEN W PCR  REFLEX  LACTIC ACID, PLASMA  INFLUENZA PANEL BY PCR (TYPE A & B)  HEMOGLOBIN A1C     PROCEDURES  Procedure(s) performed (including Critical Care):  Procedures   ____________________________________________   INITIAL IMPRESSION / ASSESSMENT AND PLAN / ED COURSE       73 year old male presenting with ongoing malaise and diarrhea after having blood cultures drawn yesterday that have subsequently grown enterococcus.  Presentation concerning for sepsis, will draw repeat blood cultures, lactate, start IV fluid hydration.  Given cultures growing enterococcus, will treat with vancomycin.  This appears likely related to UTI with staghorn calculi seen on yesterday's CT contributing.  Case discussed with hospitalist, who accepts patient for admission and will consult urology.      ____________________________________________   FINAL CLINICAL IMPRESSION(S) /  ED DIAGNOSES  Final diagnoses:  Acute cystitis without hematuria  Bacteremia  Staghorn calculus     ED Discharge Orders    None       Note:  This document was prepared using Dragon voice recognition software and may include unintentional dictation errors.   Blake Divine, MD 04/18/19 [phone]

## 2019-04-18 NOTE — ED Triage Notes (Signed)
Pt was called back to the ED with positive blood cultures, pt has continued diarrhea with generalized weakness and fever since seen here 10/4.Jonathan Cross

## 2019-04-18 NOTE — Consult Note (Signed)
Pharmacy Antibiotic Note  Jonathan Cross. is a 73 y.o. male admitted on 04/18/2019 with bacteremia.  Pharmacy has been consulted for ampicillin dosing. Per MD note: enterococcus bacteremia secondary to UTI.    Plan: Vancomycin d/c'ed. Dr. Darvin Neighbours spoke with ID and recommended to start ampicillin. F/u with cultures and sensitivities.   Will start ampicillin 2 g q4H.   Height: 5\' 9"  (175.3 cm) Weight: 247 lb (112 kg) IBW/kg (Calculated) : 70.7  Temp (24hrs), Avg:98.7 F (37.1 C), Min:98.6 F (37 C), Max:98.7 F (37.1 C)  Recent Labs  Lab 04/17/19 1527 04/17/19 1528 04/18/19 1008  WBC 6.4  --  5.1  CREATININE 1.02  --  0.83  LATICACIDVEN  --  1.7 1.5    Estimated Creatinine Clearance: 94.8 mL/min (by C-G formula based on SCr of 0.83 mg/dL).    Allergies  Allergen Reactions  . Metformin     Loose stools even with XR    Antimicrobials this admission: 10/5 vancomhycin >> 10/5 10/5 ampicillin >>   Dose adjustments this admission: None   Microbiology results: 10/4 BCx: GPC w/ BCID positive for enterococcus  10/4 UCx: Pending  10/5 BCx pending  10/5 UCx" pending  Thank you for allowing pharmacy to be a part of this patient's care.  Oswald Hillock, PharmD, BCPS.  04/18/2019 2:10 PM

## 2019-04-18 NOTE — Telephone Encounter (Signed)
Blood culture results received from Lab, Kris Mouton Nurse Navigator contacted patient and will be returning for further evaluation

## 2019-04-18 NOTE — H&P (Signed)
[facility] NAME: Jonathan Cross    MR#:  [id number]  DATE OF BIRTH:  1945-10-24  DATE OF ADMISSION:  04/18/2019  PRIMARY CARE PHYSICIAN: Patient, No Pcp Per   REQUESTING/REFERRING PHYSICIAN: Dr. Charna Archer  CHIEF COMPLAINT:   Chief Complaint  Patient presents with  . Abnormal Labs  . Diarrhea    HISTORY OF PRESENT ILLNESS:  Jonathan Cross  is a 73 y.o. male with a known history of diabetes, hypertension, aortic valve replacement, CABG presents to the hospital after being called to return with positive blood cultures from yesterday. Patient presented to the emergency room yesterday evening with fever, cough and not feeling well with decreased appetite and nausea.  He was diagnosed with UTI, pneumonia and discharged home on oral antibiotics.  Patient did not feel well after returning.  Today morning blood cultures returned with enterococcus and he was called to return.  He continues to have fevers.  COVID-19 negative x2.  Chest x-ray and CT chest clear.  CT scan of the abdomen showed right-sided staghorn calculus with mild hydronephrosis.  Urinalysis shows UTI. He is also had watery diarrhea with multiple episodes every day for 4 days now.  No abdominal pain. He complains of dysuria but no hematuria.  Has frequency.  PAST MEDICAL HISTORY:   Past Medical History:  Diagnosis Date  . Coronary artery disease   . Diabetes ([place])   . H/O aortic valve replacement   . Hx of CABG   . Hypertension     PAST SURGICAL HISTORY:  History reviewed. No pertinent surgical history. CABG SOCIAL HISTORY:   Social History   Tobacco Use  . Smoking status: Not on file  Substance Use Topics  . Alcohol use: Not on file    FAMILY HISTORY:   Family History  Problem Relation Age of Onset  . CAD Father     DRUG ALLERGIES:   Allergies  Allergen Reactions  . Metformin     Loose stools even with XR    REVIEW OF SYSTEMS:   Review of Systems   Constitutional: Positive for malaise/fatigue. Negative for chills and fever.  HENT: Negative for sore throat.   Eyes: Negative for blurred vision, double vision and pain.  Respiratory: Negative for cough, hemoptysis, shortness of breath and wheezing.   Cardiovascular: Negative for chest pain, palpitations, orthopnea and leg swelling.  Gastrointestinal: Positive for diarrhea and nausea. Negative for abdominal pain, constipation, heartburn and vomiting.  Genitourinary: Positive for dysuria. Negative for hematuria.  Musculoskeletal: Negative for back pain and joint pain.  Skin: Negative for rash.  Neurological: Negative for sensory change, speech change, focal weakness and headaches.  Endo/Heme/Allergies: Does not bruise/bleed easily.  Psychiatric/Behavioral: Negative for depression. The patient is not nervous/anxious.     MEDICATIONS AT HOME:   Prior to Admission medications   Medication Sig Start Date End Date Taking? Authorizing Provider  amLODipine (NORVASC) 5 MG tablet Take 5 mg by mouth daily. 03/08/19  Yes [provider]  aspirin EC 81 MG tablet Take 81 mg by mouth daily. 05/30/14  Yes [provider]  atorvastatin (LIPITOR) 80 MG tablet Take 80 mg by mouth daily. 08/13/18  Yes [provider]  azithromycin (ZITHROMAX Z-PAK) 250 MG tablet Take 2 tablets (500 mg) on  Day 1,  followed by 1 tablet (250 mg) once daily on Days 2 through 5. 04/17/19  Yes Carrie Mew, MD  isosorbide mononitrate (IMDUR) 30 MG 24 hr tablet Take  30 mg by mouth daily. 09/13/18 09/13/19 Yes [provider]  lisinopril (ZESTRIL) 40 MG tablet Take 40 mg by mouth daily. 12/08/18 09/04/19 Yes [provider]  loratadine (CLARITIN) 10 MG tablet Take 10 mg by mouth daily. 09/13/18 09/13/19 Yes [provider]  metoprolol succinate (TOPROL-XL) 25 MG 24 hr tablet Take 25 mg by mouth daily. 12/08/18 09/04/19 Yes [provider]  nitroGLYCERIN (NITROSTAT) 0.4 MG SL  tablet Place 0.4 mg under the tongue as needed. 03/24/18 06/17/19 Yes [provider]  NOVOLIN 70/30 RELION (70-30) 100 UNIT/ML injection Inject 80-85 Units into the skin 2 (two) times daily. Inject 85 units subcutaneously every morning and 80 units every evening 03/26/19  Yes [provider]  omeprazole (PRILOSEC) 20 MG capsule Take 20 mg by mouth daily. 04/22/18 04/22/19 Yes [provider]     VITAL SIGNS:  Blood pressure 126/71, pulse 87, resp. rate 18, height 5\' 9"  (1.753 m), weight 112 kg, SpO2 96 %.  PHYSICAL EXAMINATION:  Physical Exam  GENERAL:  73 y.o.-year-old patient lying in the bed with no acute distress.  Obese EYES: Pupils equal, round, reactive to light and accommodation. No scleral icterus. Extraocular muscles intact.  HEENT: Head atraumatic, normocephalic. Oropharynx and nasopharynx clear. No oropharyngeal erythema, moist oral mucosa  NECK:  Supple, no jugular venous distention. No thyroid enlargement, no tenderness.  LUNGS: Normal breath sounds bilaterally, no wheezing, rales, rhonchi. No use of accessory muscles of respiration.  CARDIOVASCULAR: S1, S2 normal. No murmurs, rubs, or gallops.  ABDOMEN: Soft, nontender, nondistended. Bowel sounds present. No organomegaly or mass.  EXTREMITIES: No pedal edema, cyanosis, or clubbing. + 2 pedal & radial pulses b/l.   NEUROLOGIC: Cranial nerves II through XII are intact. No focal Motor or sensory deficits appreciated b/l PSYCHIATRIC: The patient is alert and oriented x 3. Good affect.  SKIN: No obvious rash, lesion, or ulcer.   LABORATORY PANEL:   CBC Recent Labs  Lab 04/18/19 1008  WBC 5.1  HGB 10.8*  HCT 32.8*  PLT 127*   ------------------------------------------------------------------------------------------------------------------  Chemistries  Recent Labs  Lab 04/18/19 1008  NA 130*  K 3.4*  CL 97*  CO2 22  GLUCOSE 183*  BUN 14  CREATININE 0.83  CALCIUM 8.2*  AST 50*  ALT 39   ALKPHOS 45  BILITOT 0.9   ------------------------------------------------------------------------------------------------------------------  Cardiac Enzymes No results for input(s): TROPONINI in the last 168 hours. ------------------------------------------------------------------------------------------------------------------  RADIOLOGY:  Ct Chest W Contrast  Result Date: 04/17/2019 CLINICAL DATA:  73 year old male with history of acute respiratory illness. Shortness of breath. Generalized weakness and fever. EXAM: CT CHEST, ABDOMEN, AND PELVIS WITH CONTRAST TECHNIQUE: Multidetector CT imaging of the chest, abdomen and pelvis was performed following the standard protocol during bolus administration of intravenous contrast. CONTRAST:  13mL OMNIPAQUE IOHEXOL 300 MG/ML  SOLN COMPARISON:  None. FINDINGS: CT CHEST FINDINGS Cardiovascular: Heart size is normal. There is no significant pericardial fluid, thickening or pericardial calcification. There is aortic atherosclerosis, as well as atherosclerosis of the great vessels of the mediastinum and the coronary arteries, including calcified atherosclerotic plaque in the left main, left anterior descending, left circumflex and right coronary arteries. Status post median sternotomy for CABG including LIMA to the LAD, as well as aortic root replacement with a stented bioprosthesis. Mediastinum/Nodes: No pathologically enlarged mediastinal or hilar lymph nodes. Esophagus is unremarkable in appearance. No axillary lymphadenopathy. Lungs/Pleura: A few scattered tiny pulmonary nodules are noted in the lungs bilaterally, largest of which measures only 4 mm  in the right lower lobe (axial image 97 of series 4), nonspecific. No larger more suspicious appearing pulmonary nodules or masses are noted. No acute consolidative airspace disease. No pleural effusions. Musculoskeletal: Median sternotomy wires. There are no aggressive appearing lytic or blastic lesions noted in  the visualized portions of the skeleton. CT ABDOMEN PELVIS FINDINGS Hepatobiliary: 1.2 cm low-attenuation lesion in segment 4A of the liver, compatible with a simple cyst. Subcentimeter low-attenuation lesion in segment 2 of the liver, incompletely characterized on today's non-contrast CT examination, but statistically likely to represent a cyst. No other larger more suspicious appearing hepatic lesions. No intra or extrahepatic biliary ductal dilatation. Gallbladder is normal in appearance. Pancreas: No pancreatic mass. No pancreatic ductal dilatation. No pancreatic or peripancreatic fluid collections or inflammatory changes. Spleen: Unremarkable. Adrenals/Urinary Tract: Multiple right-sided renal calculi, largest of which is a staghorn calculus which extends into the renal pelvis measuring 4.0 x 2.1 x 3.9 cm (axial image 72 of series 2 and sagittal image 66 of series 6). This is associated with mild right hydronephrosis. No additional calculi are noted in the collecting system of left kidney or along the course of either ureter. No hydroureter. 5.9 cm low-attenuation lesion in the interpolar region of the left kidney, compatible with a large peripelvic cyst. Urinary bladder is normal in appearance. Bilateral adrenal glands are normal in appearance. Stomach/Bowel: Normal appearance of the stomach. No pathologic dilatation of small bowel or colon. The appendix is not confidently identified and may be surgically absent. Regardless, there are no inflammatory changes noted adjacent to the cecum to suggest the presence of an acute appendicitis at this time. Vascular/Lymphatic: Aortic atherosclerosis, without evidence of aneurysm or dissection in the abdominal or pelvic vasculature. No lymphadenopathy noted in the abdomen or pelvis. Reproductive: Prostate gland and seminal vesicles are unremarkable in appearance. Other: No significant volume of ascites.  No pneumoperitoneum. Musculoskeletal: There are no aggressive  appearing lytic or blastic lesions noted in the visualized portions of the skeleton. IMPRESSION: 1. No acute findings are noted in the thorax to account for the patient's symptoms. 2. Large calculi in the right renal collecting system, largest of which is in the staghorn calculus which extends into the renal pelvis measuring 4.0 x 2.1 x 3.9 cm which is associated with mild right proximal hydronephrosis. 3. Small pulmonary nodules in the lungs bilaterally measuring 4 mm or less in size, nonspecific, but statistically likely benign. No follow-up needed if patient is low-risk (and has no known or suspected primary neoplasm). Non-contrast chest CT can be considered in 12 months if patient is high-risk. This recommendation follows the consensus statement: Guidelines for Management of Incidental Pulmonary Nodules Detected on CT Images: From the Fleischner Society 2017; Radiology 2017; 284:228-243. 4. Aortic atherosclerosis, in addition to left main and 3 vessel coronary artery disease. Status post median sternotomy for CABG including LIMA to the LAD. 5. Additional incidental findings, as above. Electronically Signed   By: Vinnie Langton M.D.   On: 04/17/2019 20:21   Ct Abdomen Pelvis W Contrast  Result Date: 04/17/2019 CLINICAL DATA:  73 year old male with history of acute respiratory illness. Shortness of breath. Generalized weakness and fever. EXAM: CT CHEST, ABDOMEN, AND PELVIS WITH CONTRAST TECHNIQUE: Multidetector CT imaging of the chest, abdomen and pelvis was performed following the standard protocol during bolus administration of intravenous contrast. CONTRAST:  116mL OMNIPAQUE IOHEXOL 300 MG/ML  SOLN COMPARISON:  None. FINDINGS: CT CHEST FINDINGS Cardiovascular: Heart size is normal. There is no significant pericardial fluid, thickening  or pericardial calcification. There is aortic atherosclerosis, as well as atherosclerosis of the great vessels of the mediastinum and the coronary arteries, including  calcified atherosclerotic plaque in the left main, left anterior descending, left circumflex and right coronary arteries. Status post median sternotomy for CABG including LIMA to the LAD, as well as aortic root replacement with a stented bioprosthesis. Mediastinum/Nodes: No pathologically enlarged mediastinal or hilar lymph nodes. Esophagus is unremarkable in appearance. No axillary lymphadenopathy. Lungs/Pleura: A few scattered tiny pulmonary nodules are noted in the lungs bilaterally, largest of which measures only 4 mm in the right lower lobe (axial image 97 of series 4), nonspecific. No larger more suspicious appearing pulmonary nodules or masses are noted. No acute consolidative airspace disease. No pleural effusions. Musculoskeletal: Median sternotomy wires. There are no aggressive appearing lytic or blastic lesions noted in the visualized portions of the skeleton. CT ABDOMEN PELVIS FINDINGS Hepatobiliary: 1.2 cm low-attenuation lesion in segment 4A of the liver, compatible with a simple cyst. Subcentimeter low-attenuation lesion in segment 2 of the liver, incompletely characterized on today's non-contrast CT examination, but statistically likely to represent a cyst. No other larger more suspicious appearing hepatic lesions. No intra or extrahepatic biliary ductal dilatation. Gallbladder is normal in appearance. Pancreas: No pancreatic mass. No pancreatic ductal dilatation. No pancreatic or peripancreatic fluid collections or inflammatory changes. Spleen: Unremarkable. Adrenals/Urinary Tract: Multiple right-sided renal calculi, largest of which is a staghorn calculus which extends into the renal pelvis measuring 4.0 x 2.1 x 3.9 cm (axial image 72 of series 2 and sagittal image 66 of series 6). This is associated with mild right hydronephrosis. No additional calculi are noted in the collecting system of left kidney or along the course of either ureter. No hydroureter. 5.9 cm low-attenuation lesion in the  interpolar region of the left kidney, compatible with a large peripelvic cyst. Urinary bladder is normal in appearance. Bilateral adrenal glands are normal in appearance. Stomach/Bowel: Normal appearance of the stomach. No pathologic dilatation of small bowel or colon. The appendix is not confidently identified and may be surgically absent. Regardless, there are no inflammatory changes noted adjacent to the cecum to suggest the presence of an acute appendicitis at this time. Vascular/Lymphatic: Aortic atherosclerosis, without evidence of aneurysm or dissection in the abdominal or pelvic vasculature. No lymphadenopathy noted in the abdomen or pelvis. Reproductive: Prostate gland and seminal vesicles are unremarkable in appearance. Other: No significant volume of ascites.  No pneumoperitoneum. Musculoskeletal: There are no aggressive appearing lytic or blastic lesions noted in the visualized portions of the skeleton. IMPRESSION: 1. No acute findings are noted in the thorax to account for the patient's symptoms. 2. Large calculi in the right renal collecting system, largest of which is in the staghorn calculus which extends into the renal pelvis measuring 4.0 x 2.1 x 3.9 cm which is associated with mild right proximal hydronephrosis. 3. Small pulmonary nodules in the lungs bilaterally measuring 4 mm or less in size, nonspecific, but statistically likely benign. No follow-up needed if patient is low-risk (and has no known or suspected primary neoplasm). Non-contrast chest CT can be considered in 12 months if patient is high-risk. This recommendation follows the consensus statement: Guidelines for Management of Incidental Pulmonary Nodules Detected on CT Images: From the Fleischner Society 2017; Radiology 2017; 284:228-243. 4. Aortic atherosclerosis, in addition to left main and 3 vessel coronary artery disease. Status post median sternotomy for CABG including LIMA to the LAD. 5. Additional incidental findings, as above.  Electronically Signed  By: Vinnie Langton M.D.   On: 04/17/2019 20:21   Dg Chest Portable 1 View  Result Date: 04/18/2019 CLINICAL DATA:  Pt was called back to the ED with positive blood cultures, pt has continued diarrhea with generalized weakness and fever since seen here 10/4.hx of hypertension, CAD. Non smoker EXAM: PORTABLE CHEST 1 VIEW COMPARISON:  Chest radiograph 04/17/2019 FINDINGS: Stable cardiomediastinal contours status post median sternotomy. The lungs are clear. No pneumothorax or pleural effusion. No acute finding in the visualized skeleton. IMPRESSION: No evidence of active disease. Electronically Signed   By: Audie Pinto M.D.   On: 04/18/2019 11:58   Dg Chest Port 1 View  Result Date: 04/17/2019 CLINICAL DATA:  Fever, cough EXAM: PORTABLE CHEST 1 VIEW COMPARISON:  None. FINDINGS: Cardiomegaly status post median sternotomy and CABG. Both lungs are clear. The visualized skeletal structures are unremarkable. IMPRESSION: Cardiomegaly without acute abnormality of the lungs in AP portable projection. Electronically Signed   By: Eddie Candle M.D.   On: 04/17/2019 16:20     IMPRESSION AND PLAN:   *Enterococcus bacteremia likely secondary to UTI.  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  We will start him on IV vancomycin.  Consulted urology and infectious disease.  Blood cultures repeated.  Echocardiogram ordered.  Sensitivities pending.  *Sepsis secondary to bacteremia.  IV fluids ordered.  Lactic acid normal.  Monitor.  *Diabetes mellitus.  Reduced his 70/30 insulin to 50 units twice daily from 90 units twice daily. Sliding scale insulin diabetic diet  *Hypertension.  We will continue his Imdur and metoprolol at this time.  Held lisinopril and amlodipine.  Need to reorder blood pressure trends up.  *Diarrhea.  Significant and watery.  Will order C. difficile.  Isolation  *DVT prophylaxis with Lovenox  All the records are reviewed and  case discussed with ED provider. Management plans discussed with the patient, family and they are in agreement.  CODE STATUS: FULL CODE  TOTAL TIME TAKING CARE OF THIS PATIENT: 40 minutes.   Leia Alf Mairlyn Tegtmeyer M.D on 04/18/2019 at 1:06 PM  Between 7am to 6pm - Pager - [phone]  After 6pm go to [email or web address] - password [email or web address] [facility]  Office  [phone]  CC: Primary care physician; Patient, No Pcp Per  Note: This dictation was prepared with Dragon dictation along with smaller phrase technology. Any transcriptional errors that result from this process are unintentional.

## 2019-04-18 NOTE — ED Notes (Signed)
Resumed care from [facility].  Pt alert.  Iv meds infusing.

## 2019-04-18 NOTE — Consult Note (Signed)
Pharmacy Antibiotic Note  Jonathan Elting Brondon Braz. is a 73 y.o. male admitted on 04/18/2019 with bacteremia.  Pharmacy has been consulted for vancomycin dosing. Per MD note: enterococcus bacteremia secondary to UTI.    Plan: Pt received vancomycin 1000 mg x 1 on ED. Will give another vancomycin 1500 mg x 1 for a total loading dose of 2500 mg x 1. Will start a maintenance dose of 1000 mg q12H. Predicted AUC 523.5. Css min 15.1. Goal AUC 400-550. Will obtain vancomycin levels once steady state is reached. F/u with cultures and sensitivities.   Height: 5\' 9"  (175.3 cm) Weight: 247 lb (112 kg) IBW/kg (Calculated) : 70.7  Temp (24hrs), Avg:98.7 F (37.1 C), Min:98.6 F (37 C), Max:98.7 F (37.1 C)  Recent Labs  Lab 04/17/19 1527 04/17/19 1528 04/18/19 1008  WBC 6.4  --  5.1  CREATININE 1.02  --  0.83  LATICACIDVEN  --  1.7 1.5    Estimated Creatinine Clearance: 94.8 mL/min (by C-G formula based on SCr of 0.83 mg/dL).    Allergies  Allergen Reactions  . Metformin     Loose stools even with XR    Antimicrobials this admission: 10/5 vancomhycin >>   Dose adjustments this admission: None   Microbiology results: 10/4 BCx: GPC w/ BCID positive for enterococcus  10/4 UCx: Pending  10/5 BCx pending  10/5 UCx" pending  Thank you for allowing pharmacy to be a part of this patient's care.  Oswald Hillock, PharmD, BCPS.  04/18/2019 1:36 PM

## 2019-04-18 NOTE — Telephone Encounter (Signed)
Spoke with patient at [phone] asking him to return due to positive blood culture.  Son who states he is poa has called me since and I explained positive blood culture and need for return.  He will have him return.

## 2019-04-18 NOTE — ED Notes (Signed)
Admitting provider at bedside.

## 2019-04-18 NOTE — Consult Note (Signed)
Urology Consult  I have been asked to see the patient by Dr. Darvin Neighbours, for evaluation and management of right staghorn calculus with mild hydronephrosis.  Chief Complaint: Dysuria  History of Present Illness: Jonathan Kraai. is a 73 y.o. year old male who presented to the emergency department yesterday with complaints of weakness and fever x3 days.  He was discharged home with symptom improvement following IV fluids but requested to return this morning when his blood cultures returned with enterococcus.  CT AP with contrast revealed multiple right-sided renal stones, including a staghorn calculus measuring 4.0 x 2.1 x 3.9 cm with associated mild right hydronephrosis.  Additionally, there is a 5.9 cm lesion of the left kidney, likely representing a peripelvic cyst.  UA on initial presentation with >50 WBCs/HPF and 6-10 RBCs/HPF. Antibiotics as below.  Creatinine 0.83, down from 1.02 yesterday.  WBC count 5.1, down from 6.4 yesterday.  He is afebrile and normotensive.  He reports a 3-week history of dysuria and denies flank pain, gross hematuria, urgency, and frequency. He denies other UTIs.  Of note, he does have a PMH nephrolithiasis, s/p URS for a 67mm proximal right ureteral stone in 2015 with Dr. Bernardo Heater.  Postop imaging with no evidence of nephrolithiasis; he was lost to follow-up shortly thereafter. He does report additional stones prior to this that he was able to pass on his own.  Anti-infectives (From admission, onward)   Start     Dose/Rate Route Frequency Ordered Stop   04/19/19 0000  vancomycin (VANCOCIN) IVPB 1000 mg/200 mL premix  Status:  Discontinued     1,000 mg 200 mL/hr over 60 Minutes Intravenous Every 12 hours 04/18/19 1350 04/18/19 1405   04/18/19 2000  ampicillin (OMNIPEN) 2 g in sodium chloride 0.9 % 100 mL IVPB     2 g 300 mL/hr over 20 Minutes Intravenous Every 4 hours 04/18/19 1413     04/18/19 1600  ampicillin (OMNIPEN) 2 g in sodium chloride 0.9 % 100  mL IVPB  Status:  Discontinued     2 g 300 mL/hr over 20 Minutes Intravenous Every 4 hours 04/18/19 1408 04/18/19 1413   04/18/19 1500  ampicillin (OMNIPEN) 2 g in sodium chloride 0.9 % 100 mL IVPB     2 g 300 mL/hr over 20 Minutes Intravenous  Once 04/18/19 1409 04/18/19 1439   04/18/19 1430  vancomycin (VANCOCIN) 1,500 mg in sodium chloride 0.9 % 500 mL IVPB  Status:  Discontinued     1,500 mg 250 mL/hr over 120 Minutes Intravenous  Once 04/18/19 1350 04/18/19 1352   04/18/19 1430  vancomycin (VANCOCIN) 1,500 mg in sodium chloride 0.9 % 500 mL IVPB  Status:  Discontinued     1,500 mg 250 mL/hr over 120 Minutes Intravenous  Once 04/18/19 1351 04/18/19 1412   04/18/19 1230  vancomycin (VANCOCIN) injection 1,000 mg  Status:  Discontinued     1,000 mg Intravenous  Once 04/18/19 1219 04/18/19 1221   04/18/19 1230  vancomycin (VANCOCIN) IVPB 1000 mg/200 mL premix     1,000 mg 200 mL/hr over 60 Minutes Intravenous  Once 04/18/19 1222 04/18/19 1414      Past Medical History:  Diagnosis Date  . Coronary artery disease   . Diabetes ([place])   . H/O aortic valve replacement   . Hx of CABG   . Hypertension     History reviewed. No pertinent surgical history.  Home Medications:  Current Meds  Medication Sig  . amLODipine (  NORVASC) 5 MG tablet Take 5 mg by mouth daily.  Marland Kitchen aspirin EC 81 MG tablet Take 81 mg by mouth daily.  Marland Kitchen atorvastatin (LIPITOR) 80 MG tablet Take 80 mg by mouth daily.  Marland Kitchen azithromycin (ZITHROMAX Z-PAK) 250 MG tablet Take 2 tablets (500 mg) on  Day 1,  followed by 1 tablet (250 mg) once daily on Days 2 through 5.  . isosorbide mononitrate (IMDUR) 30 MG 24 hr tablet Take 30 mg by mouth daily.  Marland Kitchen lisinopril (ZESTRIL) 40 MG tablet Take 40 mg by mouth daily.  Marland Kitchen loratadine (CLARITIN) 10 MG tablet Take 10 mg by mouth daily.  . metoprolol succinate (TOPROL-XL) 25 MG 24 hr tablet Take 25 mg by mouth daily.  . nitroGLYCERIN (NITROSTAT) 0.4 MG SL tablet Place 0.4 mg under the tongue  as needed.  Marland Kitchen NOVOLIN 70/30 RELION (70-30) 100 UNIT/ML injection Inject 80-85 Units into the skin 2 (two) times daily. Inject 85 units subcutaneously every morning and 80 units every evening  . omeprazole (PRILOSEC) 20 MG capsule Take 20 mg by mouth daily.    Allergies:  Allergies  Allergen Reactions  . Metformin     Loose stools even with XR    Family History  Problem Relation Age of Onset  . CAD Father     Social History:  has no history on file for tobacco, alcohol, and drug.  ROS: A complete review of systems was performed.  All systems are negative except for pertinent findings as noted.  Physical Exam:  Vital signs in last 24 hours: Temp:  [98.7 F (37.1 C)] 98.7 F (37.1 C) (10/04 2145) Pulse Rate:  [81-103] 86 (10/05 1500) Resp:  [15-26] 15 (10/05 1330) BP: (101-138)/(56-86) 138/86 (10/05 1500) SpO2:  [91 %-98 %] 96 % (10/05 1500) Weight:  AL:538233 kg] 112 kg (10/05 0940) Constitutional:  Awake and alert, ill appearing HEENT: [place] AT, moist mucus membranes Cardiovascular: No clubbing, cyanosis, or edema Respiratory: Normal respiratory effort, nagging cough with occasional clear phlegm Skin: No rashes, bruises or suspicious lesions; hyperpigmented BUEs. Neurologic: Grossly intact, no focal deficits, moving all 4 extremities Psychiatric: Normal mood and affect, delayed speech  Laboratory Data:  Recent Labs    04/17/19 1527 04/18/19 1008  WBC 6.4 5.1  HGB 12.0* 10.8*  HCT 36.0* 32.8*   Recent Labs    04/17/19 1527 04/18/19 1008  NA 134* 130*  K 3.6 3.4*  CL 100 97*  CO2 22 22  GLUCOSE 157* 183*  BUN 14 14  CREATININE 1.02 0.83  CALCIUM 8.6* 8.2*   Recent Labs    04/17/19 1527  INR 1.2   Urinalysis    Component Value Date/Time   COLORURINE YELLOW (A) 04/18/2019 1020   APPEARANCEUR HAZY (A) 04/18/2019 1020   LABSPEC 1.017 04/18/2019 1020   PHURINE 5.0 04/18/2019 1020   GLUCOSEU NEGATIVE 04/18/2019 1020   HGBUR SMALL (A) 04/18/2019 1020    BILIRUBINUR NEGATIVE 04/18/2019 1020   KETONESUR NEGATIVE 04/18/2019 1020   PROTEINUR 30 (A) 04/18/2019 1020   NITRITE NEGATIVE 04/18/2019 1020   LEUKOCYTESUR SMALL (A) 04/18/2019 1020   Results for orders placed or performed during the hospital encounter of 04/18/19  SARS Coronavirus 2 [facility] order, Performed in [facility] hospital lab) Nasopharyngeal Nasopharyngeal Swab     Status: None   Collection Time: 04/18/19 10:08 AM   Specimen: Nasopharyngeal Swab  Result Value Ref Range Status   SARS Coronavirus 2 NEGATIVE NEGATIVE Final    Comment: (NOTE) If result is  NEGATIVE SARS-CoV-2 target nucleic acids are NOT DETECTED. The SARS-CoV-2 RNA is generally detectable in upper and lower  respiratory specimens during the acute phase of infection. The lowest  concentration of SARS-CoV-2 viral copies this assay can detect is 250  copies / mL. A negative result does not preclude SARS-CoV-2 infection  and should not be used as the sole basis for treatment or other  patient management decisions.  A negative result may occur with  improper specimen collection / handling, submission of specimen other  than nasopharyngeal swab, presence of viral mutation(s) within the  areas targeted by this assay, and inadequate number of viral copies  (<250 copies / mL). A negative result must be combined with clinical  observations, patient history, and epidemiological information. If result is POSITIVE SARS-CoV-2 target nucleic acids are DETECTED. The SARS-CoV-2 RNA is generally detectable in upper and lower  respiratory specimens dur ing the acute phase of infection.  Positive  results are indicative of active infection with SARS-CoV-2.  Clinical  correlation with patient history and other diagnostic information is  necessary to determine patient infection status.  Positive results do  not rule out bacterial infection or co-infection with other viruses. If result is PRESUMPTIVE POSTIVE SARS-CoV-2  nucleic acids MAY BE PRESENT.   A presumptive positive result was obtained on the submitted specimen  and confirmed on repeat testing.  While 2019 novel coronavirus  (SARS-CoV-2) nucleic acids may be present in the submitted sample  additional confirmatory testing may be necessary for epidemiological  and / or clinical management purposes  to differentiate between  SARS-CoV-2 and other Sarbecovirus currently known to infect humans.  If clinically indicated additional testing with an alternate test  methodology [phone]) is advised. The SARS-CoV-2 RNA is generally  detectable in upper and lower respiratory sp ecimens during the acute  phase of infection. The expected result is Negative. Fact Sheet for Patients:  StrictlyIdeas.no Fact Sheet for Healthcare Providers: BankingDealers.co.za This test is not yet approved or cleared by the [place] FDA and has been authorized for detection and/or diagnosis of SARS-CoV-2 by FDA under an Emergency Use Authorization (EUA).  This EUA will remain in effect (meaning this test can be used) for the duration of the COVID-19 declaration under Section 564(b)(1) of the Act, 21 U.S.C. section 360bbb-3(b)(1), unless the authorization is terminated or revoked sooner. Performed at [facility], [address] [place], [place] [address]     Radiologic Imaging: Ct Abdomen Pelvis W Contrast  Result Date: 04/17/2019 CLINICAL DATA:  73 year old male with history of acute respiratory illness. Shortness of breath. Generalized weakness and fever. EXAM: CT CHEST, ABDOMEN, AND PELVIS WITH CONTRAST TECHNIQUE: Multidetector CT imaging of the chest, abdomen and pelvis was performed following the standard protocol during bolus administration of intravenous contrast. CONTRAST:  149mL OMNIPAQUE IOHEXOL 300 MG/ML  SOLN COMPARISON:  None. FINDINGS: CT CHEST FINDINGS Cardiovascular: Heart size is normal. There is no  significant pericardial fluid, thickening or pericardial calcification. There is aortic atherosclerosis, as well as atherosclerosis of the great vessels of the mediastinum and the coronary arteries, including calcified atherosclerotic plaque in the left main, left anterior descending, left circumflex and right coronary arteries. Status post median sternotomy for CABG including LIMA to the LAD, as well as aortic root replacement with a stented bioprosthesis. Mediastinum/Nodes: No pathologically enlarged mediastinal or hilar lymph nodes. Esophagus is unremarkable in appearance. No axillary lymphadenopathy. Lungs/Pleura: A few scattered tiny pulmonary nodules are noted in the lungs bilaterally, largest of which  measures only 4 mm in the right lower lobe (axial image 97 of series 4), nonspecific. No larger more suspicious appearing pulmonary nodules or masses are noted. No acute consolidative airspace disease. No pleural effusions. Musculoskeletal: Median sternotomy wires. There are no aggressive appearing lytic or blastic lesions noted in the visualized portions of the skeleton. CT ABDOMEN PELVIS FINDINGS Hepatobiliary: 1.2 cm low-attenuation lesion in segment 4A of the liver, compatible with a simple cyst. Subcentimeter low-attenuation lesion in segment 2 of the liver, incompletely characterized on today's non-contrast CT examination, but statistically likely to represent a cyst. No other larger more suspicious appearing hepatic lesions. No intra or extrahepatic biliary ductal dilatation. Gallbladder is normal in appearance. Pancreas: No pancreatic mass. No pancreatic ductal dilatation. No pancreatic or peripancreatic fluid collections or inflammatory changes. Spleen: Unremarkable. Adrenals/Urinary Tract: Multiple right-sided renal calculi, largest of which is a staghorn calculus which extends into the renal pelvis measuring 4.0 x 2.1 x 3.9 cm (axial image 72 of series 2 and sagittal image 66 of series 6). This is  associated with mild right hydronephrosis. No additional calculi are noted in the collecting system of left kidney or along the course of either ureter. No hydroureter. 5.9 cm low-attenuation lesion in the interpolar region of the left kidney, compatible with a large peripelvic cyst. Urinary bladder is normal in appearance. Bilateral adrenal glands are normal in appearance. Stomach/Bowel: Normal appearance of the stomach. No pathologic dilatation of small bowel or colon. The appendix is not confidently identified and may be surgically absent. Regardless, there are no inflammatory changes noted adjacent to the cecum to suggest the presence of an acute appendicitis at this time. Vascular/Lymphatic: Aortic atherosclerosis, without evidence of aneurysm or dissection in the abdominal or pelvic vasculature. No lymphadenopathy noted in the abdomen or pelvis. Reproductive: Prostate gland and seminal vesicles are unremarkable in appearance. Other: No significant volume of ascites.  No pneumoperitoneum. Musculoskeletal: There are no aggressive appearing lytic or blastic lesions noted in the visualized portions of the skeleton. IMPRESSION: 1. No acute findings are noted in the thorax to account for the patient's symptoms. 2. Large calculi in the right renal collecting system, largest of which is in the staghorn calculus which extends into the renal pelvis measuring 4.0 x 2.1 x 3.9 cm which is associated with mild right proximal hydronephrosis. 3. Small pulmonary nodules in the lungs bilaterally measuring 4 mm or less in size, nonspecific, but statistically likely benign. No follow-up needed if patient is low-risk (and has no known or suspected primary neoplasm). Non-contrast chest CT can be considered in 12 months if patient is high-risk. This recommendation follows the consensus statement: Guidelines for Management of Incidental Pulmonary Nodules Detected on CT Images: From the Fleischner Society 2017; Radiology 2017;  [id number]. 4. Aortic atherosclerosis, in addition to left main and 3 vessel coronary artery disease. Status post median sternotomy for CABG including LIMA to the LAD. 5. Additional incidental findings, as above. Electronically Signed   By: Vinnie Langton M.D.   On: 04/17/2019 20:21   I personally reviewed the imaging above and note the presence of multiple right renal stones including one staghorn stone as well as the presence of a large lesion within the left kidney most consistent with a cyst.  Assessment & Plan:  Patient with enterococcus bacteremia in the setting of urinary tract infection with an incidental finding of a right staghorn calculus.  On antibiotics per blood cultures, urine cultures pending.  No flank pain or gross hematuria.  Creatinine normal.  Recommend continued antibiotic management of his known urinary tract infection.  Following treatment, recommend outpatient follow-up in clinic to discuss definitive stone management.  I counseled patient that it is critically important for [place] to manage his infection before addressing his stone, as treating his stone in the setting of a urinary infection could lead to worsened bacteremia and sepsis.  He expressed understanding.  Thank you for involving me in this patient's care, I will continue to follow along.  Debroah Loop, PA-C 04/18/2019 3:45 PM

## 2019-04-18 NOTE — Consult Note (Signed)
NAME: Treshon Groesbeck.  DOB: Nov 13, 1945  MRN: [id number]  Date/Time: 04/18/2019 2:46 PM  REQUESTING PROVIDER: Dr. Darvin Neighbours Subjective:  REASON FOR CONSULT: Enterococcus bacteremia ? Lakin Ramsour. is a 73 y.o. male with a history of diabetes mellitus, renal stones, coronary artery disease status post CABG and aortic valve replacement presents to the hospital with fever cough and not feeling well with decreased appetite for the past few days. As per patient he has been feeling unwell for the last 4 weeks.  He has noticed recently increasing urination especially at nighttime with minimal dysuria.  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  He last went to work  Friday and Saturday and then Saturday and then felt very weak and tired and slept the whole Sunday.  This happened the previous weekends as well.  He has been having chills.  He came to the ED yesterday when his temperature was 101.5.  He was ruled out for COVID, Labs were sent and he was given a dose of ceftriaxone and azithromycin.  UA had shown more than 50 WBC and  UTI was questioned.  He was discharged home but had to be called back as the blood culture came back positive for enterococcus. Patient had an aortic valve replacement in 2017.  He has been doing fine since then and has not had any infection since then.  Prior to that many years ago he says he had an infection in his mouth and jaw and had to undergo surgery..   Past medical history Renal stones Obstructive sleep apnea GERD Coronary artery disease Diabetes mellitus Hypertension Aortic valve stenosis SVT Mixed hyperlipidemia  Past surgical history CABG Aortic valve replacement with bovine valve Appendectomy Tonsillectomy Throat surgery for infection Lithotripsy cystoscopy  Family history Coronary artery disease father Diabetes mother Stroke mother Diabetes Sister Coronary artery disease sister  Social history Lives with his  wife Non-smoker   Allergies  Allergen Reactions  . Metformin     Loose stools even with XR    ? Current Facility-Administered Medications  Medication Dose Route Frequency Provider Last Rate Last Dose  . 0.9 % NaCl with KCl 20 mEq/ L  infusion   Intravenous Continuous Hillary Bow, MD 100 mL/hr at 04/18/19 1434    . acetaminophen (TYLENOL) tablet 650 mg  650 mg Oral Q6H PRN Hillary Bow, MD       Or  . acetaminophen (TYLENOL) suppository 650 mg  650 mg Rectal Q6H PRN Sudini, Srikar, MD      . albuterol (PROVENTIL) (2.5 MG/3ML) 0.083% nebulizer solution 2.5 mg  2.5 mg Nebulization Q2H PRN Sudini, Srikar, MD      . ampicillin (OMNIPEN) 2 g in sodium chloride 0.9 % 100 mL IVPB  2 g Intravenous Once Hillary Bow, MD 300 mL/hr at 04/18/19 1436 2 g at 04/18/19 1436  . ampicillin (OMNIPEN) 2 g in sodium chloride 0.9 % 100 mL IVPB  2 g Intravenous Q4H Sudini, Srikar, MD      . enoxaparin (LOVENOX) injection 40 mg  40 mg Subcutaneous Q24H Sudini, Srikar, MD      . insulin aspart (novoLOG) injection 0-15 Units  0-15 Units Subcutaneous TID WC Sudini, Srikar, MD      . insulin aspart (novoLOG) injection 0-5 Units  0-5 Units Subcutaneous QHS Sudini, Srikar, MD      . ondansetron (ZOFRAN) tablet 4 mg  4 mg Oral Q6H PRN Hillary Bow, MD  Or  . ondansetron (ZOFRAN) injection 4 mg  4 mg Intravenous Q6H PRN Hillary Bow, MD       Current Outpatient Medications  Medication Sig Dispense Refill  . amLODipine (NORVASC) 5 MG tablet Take 5 mg by mouth daily.    Marland Kitchen aspirin EC 81 MG tablet Take 81 mg by mouth daily.    Marland Kitchen atorvastatin (LIPITOR) 80 MG tablet Take 80 mg by mouth daily.    Marland Kitchen azithromycin (ZITHROMAX Z-PAK) 250 MG tablet Take 2 tablets (500 mg) on  Day 1,  followed by 1 tablet (250 mg) once daily on Days 2 through 5. 6 each 0  . isosorbide mononitrate (IMDUR) 30 MG 24 hr tablet Take 30 mg by mouth daily.    Marland Kitchen lisinopril (ZESTRIL) 40 MG tablet Take 40 mg by mouth daily.    Marland Kitchen  loratadine (CLARITIN) 10 MG tablet Take 10 mg by mouth daily.    . metoprolol succinate (TOPROL-XL) 25 MG 24 hr tablet Take 25 mg by mouth daily.    . nitroGLYCERIN (NITROSTAT) 0.4 MG SL tablet Place 0.4 mg under the tongue as needed.    Marland Kitchen NOVOLIN 70/30 RELION (70-30) 100 UNIT/ML injection Inject 80-85 Units into the skin 2 (two) times daily. Inject 85 units subcutaneously every morning and 80 units every evening    . omeprazole (PRILOSEC) 20 MG capsule Take 20 mg by mouth daily.       Abtx:  Anti-infectives (From admission, onward)   Start     Dose/Rate Route Frequency Ordered Stop   04/19/19 0000  vancomycin (VANCOCIN) IVPB 1000 mg/200 mL premix  Status:  Discontinued     1,000 mg 200 mL/hr over 60 Minutes Intravenous Every 12 hours 04/18/19 1350 04/18/19 1405   04/18/19 2000  ampicillin (OMNIPEN) 2 g in sodium chloride 0.9 % 100 mL IVPB     2 g 300 mL/hr over 20 Minutes Intravenous Every 4 hours 04/18/19 1413     04/18/19 1600  ampicillin (OMNIPEN) 2 g in sodium chloride 0.9 % 100 mL IVPB  Status:  Discontinued     2 g 300 mL/hr over 20 Minutes Intravenous Every 4 hours 04/18/19 1408 04/18/19 1413   04/18/19 1500  ampicillin (OMNIPEN) 2 g in sodium chloride 0.9 % 100 mL IVPB     2 g 300 mL/hr over 20 Minutes Intravenous  Once 04/18/19 1409     04/18/19 1430  vancomycin (VANCOCIN) 1,500 mg in sodium chloride 0.9 % 500 mL IVPB  Status:  Discontinued     1,500 mg 250 mL/hr over 120 Minutes Intravenous  Once 04/18/19 1350 04/18/19 1352   04/18/19 1430  vancomycin (VANCOCIN) 1,500 mg in sodium chloride 0.9 % 500 mL IVPB  Status:  Discontinued     1,500 mg 250 mL/hr over 120 Minutes Intravenous  Once 04/18/19 1351 04/18/19 1412   04/18/19 1230  vancomycin (VANCOCIN) injection 1,000 mg  Status:  Discontinued     1,000 mg Intravenous  Once 04/18/19 1219 04/18/19 1221   04/18/19 1230  vancomycin (VANCOCIN) IVPB 1000 mg/200 mL premix     1,000 mg 200 mL/hr over 60 Minutes Intravenous  Once  04/18/19 1222 04/18/19 1414      REVIEW OF SYSTEMS:  Const:  fever,  chills, negative weight loss Eyes: negative diplopia or visual changes, negative eye pain ENT: negative coryza, negative sore throat Resp:  cough, no hemoptysis, dyspnea Cards: Has chest pain, no palpitations, lower extremity edema GU: Positive frequency, dysuria, no hematuria GI:  Negative for abdominal pain, has diarrhea, and nausea and poor appetite skin: negative for rash and pruritus Heme: negative for easy bruising and gum/nose bleeding MS: Generalized weakness and fatigue Neurolo: Positive headaches, no dizziness, vertigo, memory problems  Psych: negative for feelings of anxiety, depression  Endocrine: Has diabetes Allergy/Immunology-Metformin Objective:  VITALS:  BP (!) 114/59   Pulse 81   Resp 15   Ht 5\' 9"  (1.753 m)   Wt 112 kg   SpO2 93%   BMI 36.48 kg/m  PHYSICAL EXAM:  General: Alert, cooperative, no distress, appears stated age.  Head: Normocephalic, without obvious abnormality, atraumatic. Eyes: Conjunctivae clear, anicteric sclerae. Pupils are equal ENT Nares normal. No drainage or sinus tenderness. Lips, mucosa, and tongue normal. No Thrush Top dentures Lower no teeth Neck: Supple, symmetrical, no adenopathy, thyroid: non tender no carotid bruit and no JVD. Back: No CVA tenderness. Lungs: Clear to auscultation bilaterally. No Wheezing or Rhonchi. No rales. Heart: Regular rate and rhythm, no murmur, rub or gallop. Sternal scar Abdomen: Soft, non-tender,not distended. Bowel sounds normal. No masses Extremities: atraumatic, no cyanosis. No edema. No clubbing Skin: No rashes or lesions. Or bruising Lymph: Cervical, supraclavicular normal. Neurologic: Grossly non-focal Pertinent Labs Lab Results CBC    Component Value Date/Time   WBC 5.1 04/18/2019 1008   RBC 3.20 (L) 04/18/2019 1008   HGB 10.8 (L) 04/18/2019 1008   HCT 32.8 (L) 04/18/2019 1008   PLT 127 (L) 04/18/2019 1008   MCV  102.5 (H) 04/18/2019 1008   MCH 33.8 04/18/2019 1008   MCHC 32.9 04/18/2019 1008   RDW 14.3 04/18/2019 1008   LYMPHSABS 1.5 04/18/2019 1008   MONOABS 0.7 04/18/2019 1008   EOSABS 0.0 04/18/2019 1008   BASOSABS 0.0 04/18/2019 1008    CMP Latest Ref Rng & Units 04/18/2019 04/17/2019  Glucose 70 - 99 mg/dL 183(H) 157(H)  BUN 8 - 23 mg/dL 14 14  Creatinine 0.61 - 1.24 mg/dL 0.83 1.02  Sodium 135 - 145 mmol/L 130(L) 134(L)  Potassium 3.5 - 5.1 mmol/L 3.4(L) 3.6  Chloride 98 - 111 mmol/L 97(L) 100  CO2 22 - 32 mmol/L 22 22  Calcium 8.9 - 10.3 mg/dL 8.2(L) 8.6(L)  Total Protein 6.5 - 8.1 g/dL 6.9 7.5  Total Bilirubin 0.3 - 1.2 mg/dL 0.9 0.9  Alkaline Phos 38 - 126 U/L 45 49  AST 15 - 41 U/L 50(H) 40  ALT 0 - 44 U/L 39 33      Microbiology: Recent Results (from the past 240 hour(s))  Blood Culture (routine x 2)     Status: None (Preliminary result)   Collection Time: 04/17/19  3:27 PM   Specimen: BLOOD  Result Value Ref Range Status   Specimen Description   Final    BLOOD RIGHT ANTECUBITAL Performed at [facility], [address], [place], [place] [address]    Special Requests   Final    BOTTLES DRAWN AEROBIC AND ANAEROBIC Blood Culture adequate volume Performed at [facility], [address] [place], [place] [address]    Culture  Setup Time   Final    GRAM POSITIVE COCCI IN BOTH AEROBIC AND ANAEROBIC BOTTLES CRITICAL RESULT CALLED TO, READ BACK BY AND VERIFIED WITH: GREG MOYER AT [place] ON 04/18/2019 [place]. Performed at [facility], [place] [address], [place], [place] [address]    Culture [facility] POSITIVE COCCI  Final   Report Status PENDING  Incomplete  Blood Culture ID Panel (Reflexed)     Status:  Abnormal   Collection Time: 04/17/19  3:27 PM  Result Value Ref Range Status   Enterococcus species DETECTED (A) NOT DETECTED Final    Comment: CRITICAL RESULT CALLED TO, READ BACK BY AND VERIFIED WITH: GREG MOYER AT 0723 ON 04/18/2019 [place].    Vancomycin  resistance NOT DETECTED NOT DETECTED Final   Listeria monocytogenes NOT DETECTED NOT DETECTED Final   Staphylococcus species NOT DETECTED NOT DETECTED Final   Staphylococcus aureus (BCID) NOT DETECTED NOT DETECTED Final   Streptococcus species NOT DETECTED NOT DETECTED Final   Streptococcus agalactiae NOT DETECTED NOT DETECTED Final   Streptococcus pneumoniae NOT DETECTED NOT DETECTED Final   Streptococcus pyogenes NOT DETECTED NOT DETECTED Final   Acinetobacter baumannii NOT DETECTED NOT DETECTED Final   Enterobacteriaceae species NOT DETECTED NOT DETECTED Final   Enterobacter cloacae complex NOT DETECTED NOT DETECTED Final   Escherichia coli NOT DETECTED NOT DETECTED Final   Klebsiella oxytoca NOT DETECTED NOT DETECTED Final   Klebsiella pneumoniae NOT DETECTED NOT DETECTED Final   Proteus species NOT DETECTED NOT DETECTED Final   Serratia marcescens NOT DETECTED NOT DETECTED Final   Haemophilus influenzae NOT DETECTED NOT DETECTED Final   Neisseria meningitidis NOT DETECTED NOT DETECTED Final   Pseudomonas aeruginosa NOT DETECTED NOT DETECTED Final   Candida albicans NOT DETECTED NOT DETECTED Final   Candida glabrata NOT DETECTED NOT DETECTED Final   Candida krusei NOT DETECTED NOT DETECTED Final   Candida parapsilosis NOT DETECTED NOT DETECTED Final   Candida tropicalis NOT DETECTED NOT DETECTED Final    Comment: Performed at [facility], [address] [place], [place] [address]  Blood Culture (routine x 2)     Status: None (Preliminary result)   Collection Time: 04/17/19  3:32 PM   Specimen: BLOOD  Result Value Ref Range Status   Specimen Description BLOOD LEFT ANTECUBITAL  Final   Special Requests   Final    BOTTLES DRAWN AEROBIC AND ANAEROBIC Blood Culture results may not be optimal due to an excessive volume of blood received in culture bottles   Culture  Setup Time   Final    GRAM POSITIVE COCCI IN BOTH AEROBIC AND ANAEROBIC BOTTLES CRITICAL RESULT CALLED TO,  READ BACK BY AND VERIFIED WITH: GREG MOYER AT 0723 ON 04/18/2019 [place]. Performed at [facility], [address] [place], [place] [address]    Culture [facility] POSITIVE COCCI  Final   Report Status PENDING  Incomplete  SARS Coronavirus 2 [facility] order, Performed in [facility] hospital lab) Nasopharyngeal Nasopharyngeal Swab     Status: None   Collection Time: 04/17/19  3:46 PM   Specimen: Nasopharyngeal Swab  Result Value Ref Range Status   SARS Coronavirus 2 NEGATIVE NEGATIVE Final    Comment: (NOTE) If result is NEGATIVE SARS-CoV-2 target nucleic acids are NOT DETECTED. The SARS-CoV-2 RNA is generally detectable in upper and lower  respiratory specimens during the acute phase of infection. The lowest  concentration of SARS-CoV-2 viral copies this assay can detect is 250  copies / mL. A negative result does not preclude SARS-CoV-2 infection  and should not be used as the sole basis for treatment or other  patient management decisions.  A negative result may occur with  improper specimen collection / handling, submission of specimen other  than nasopharyngeal swab, presence of viral mutation(s) within the  areas targeted by this assay, and inadequate number of viral copies  (<250 copies / mL). A negative result must be combined with  clinical  observations, patient history, and epidemiological information. If result is POSITIVE SARS-CoV-2 target nucleic acids are DETECTED. The SARS-CoV-2 RNA is generally detectable in upper and lower  respiratory specimens dur ing the acute phase of infection.  Positive  results are indicative of active infection with SARS-CoV-2.  Clinical  correlation with patient history and other diagnostic information is  necessary to determine patient infection status.  Positive results do  not rule out bacterial infection or co-infection with other viruses. If result is PRESUMPTIVE POSTIVE SARS-CoV-2 nucleic acids MAY BE PRESENT.   A presumptive  positive result was obtained on the submitted specimen  and confirmed on repeat testing.  While 2019 novel coronavirus  (SARS-CoV-2) nucleic acids may be present in the submitted sample  additional confirmatory testing may be necessary for epidemiological  and / or clinical management purposes  to differentiate between  SARS-CoV-2 and other Sarbecovirus currently known to infect humans.  If clinically indicated additional testing with an alternate test  methodology [phone]) is advised. The SARS-CoV-2 RNA is generally  detectable in upper and lower respiratory sp ecimens during the acute  phase of infection. The expected result is Negative. Fact Sheet for Patients:  StrictlyIdeas.no Fact Sheet for Healthcare Providers: BankingDealers.co.za This test is not yet approved or cleared by the [place] FDA and has been authorized for detection and/or diagnosis of SARS-CoV-2 by FDA under an Emergency Use Authorization (EUA).  This EUA will remain in effect (meaning this test can be used) for the duration of the COVID-19 declaration under Section 564(b)(1) of the Act, 21 U.S.C. section 360bbb-3(b)(1), unless the authorization is terminated or revoked sooner. Performed at [facility], [address] [place], [place] [address]   SARS Coronavirus 2 [facility] order, Performed in [facility] hospital lab) Nasopharyngeal Nasopharyngeal Swab     Status: None   Collection Time: 04/18/19 10:08 AM   Specimen: Nasopharyngeal Swab  Result Value Ref Range Status   SARS Coronavirus 2 NEGATIVE NEGATIVE Final    Comment: (NOTE) If result is NEGATIVE SARS-CoV-2 target nucleic acids are NOT DETECTED. The SARS-CoV-2 RNA is generally detectable in upper and lower  respiratory specimens during the acute phase of infection. The lowest  concentration of SARS-CoV-2 viral copies this assay can detect is 250  copies / mL. A negative result does not  preclude SARS-CoV-2 infection  and should not be used as the sole basis for treatment or other  patient management decisions.  A negative result may occur with  improper specimen collection / handling, submission of specimen other  than nasopharyngeal swab, presence of viral mutation(s) within the  areas targeted by this assay, and inadequate number of viral copies  (<250 copies / mL). A negative result must be combined with clinical  observations, patient history, and epidemiological information. If result is POSITIVE SARS-CoV-2 target nucleic acids are DETECTED. The SARS-CoV-2 RNA is generally detectable in upper and lower  respiratory specimens dur ing the acute phase of infection.  Positive  results are indicative of active infection with SARS-CoV-2.  Clinical  correlation with patient history and other diagnostic information is  necessary to determine patient infection status.  Positive results do  not rule out bacterial infection or co-infection with other viruses. If result is PRESUMPTIVE POSTIVE SARS-CoV-2 nucleic acids MAY BE PRESENT.   A presumptive positive result was obtained on the submitted specimen  and confirmed on repeat testing.  While 2019 novel coronavirus  (SARS-CoV-2) nucleic acids may be present in the submitted sample  additional confirmatory testing may be necessary for epidemiological  and / or clinical management purposes  to differentiate between  SARS-CoV-2 and other Sarbecovirus currently known to infect humans.  If clinically indicated additional testing with an alternate test  methodology [phone]) is advised. The SARS-CoV-2 RNA is generally  detectable in upper and lower respiratory sp ecimens during the acute  phase of infection. The expected result is Negative. Fact Sheet for Patients:  StrictlyIdeas.no Fact Sheet for Healthcare Providers: BankingDealers.co.za This test is not yet approved or cleared by  the [place] FDA and has been authorized for detection and/or diagnosis of SARS-CoV-2 by FDA under an Emergency Use Authorization (EUA).  This EUA will remain in effect (meaning this test can be used) for the duration of the COVID-19 declaration under Section 564(b)(1) of the Act, 21 U.S.C. section 360bbb-3(b)(1), unless the authorization is terminated or revoked sooner. Performed at [facility], [address] [place], [place] [address]     IMAGING RESULTS: CT abdomen and pelvis Multiple right-sided renal calculi, largest of which is a staghorn calculus which extends into the renal pelvis measuring 4 into 3.9 cm into 2.1 cm.  This is associated with mild right hydronephrosis.  No hydroureter.  Large peripelvic cyst on the left side. I have personally reviewed the films ? Impression/Recommendation ?73 y.o. male with a history of coronary artery disease status post CABG and aortic valve replacement presents to the hospital with fever cough and not feeling well with decreased appetite for the past few days.  He also has a history of renal stones. ? ?Enterococcus bacteremia.  Likely source urinary tract.  He has right renal calculi with mild hydronephrosis.  Await urine culture. As he has prosthetic aortic valve need to rule out endocarditis of the aorta. Start ampicillin.  May need ceftriaxone as well.  We will get a 2D echo.  Will need TEE if the 2D echo was normal  Right renal calculus with mild hydronephrosis.  Await urine culture.  Urology consult  Aortic valve stenosis status post valve replacement in 2017 Coronary artery disease status post CABG On metoprolol, aspirin,Isosorbide mononitrate and Lipitor.  Diabetes mellitus on insulin  ___________________________________________________ Discussed with patient and requesting provider Note:  This document was prepared using Dragon voice recognition software and may include unintentional dictation errors.

## 2019-04-19 ENCOUNTER — Inpatient Hospital Stay (HOSPITAL_COMMUNITY)
Admit: 2019-04-19 | Discharge: 2019-04-19 | Disposition: A | Discharge status: Home / Self Care | Payer: Medicare HMO | Attending: Internal Medicine | Admitting: Internal Medicine

## 2019-04-19 DIAGNOSIS — N401 Enlarged prostate with lower urinary tract symptoms: Secondary | ICD-10-CM

## 2019-04-19 DIAGNOSIS — R7881 Bacteremia: Secondary | ICD-10-CM

## 2019-04-19 DIAGNOSIS — R3914 Feeling of incomplete bladder emptying: Secondary | ICD-10-CM

## 2019-04-19 LAB — BLOOD CULTURE ID PANEL (REFLEXED)

## 2019-04-19 LAB — CBC
HCT: 33.1 % — ABNORMAL LOW (ref 39.0–52.0)
Hemoglobin: 10.9 g/dL — ABNORMAL LOW (ref 13.0–17.0)
MCH: 33.9 pg (ref 26.0–34.0)
MCHC: 32.9 g/dL (ref 30.0–36.0)
MCV: 102.8 fL — ABNORMAL HIGH (ref 80.0–100.0)
Platelets: 133 10*3/uL — ABNORMAL LOW (ref 150–400)
RBC: 3.22 MIL/uL — ABNORMAL LOW (ref 4.22–5.81)
RDW: 14.3 % (ref 11.5–15.5)
WBC: 5.4 10*3/uL (ref 4.0–10.5)
nRBC: 0 % (ref 0.0–0.2)

## 2019-04-19 LAB — BASIC METABOLIC PANEL
Anion gap: 8 (ref 5–15)
BUN: 11 mg/dL (ref 8–23)
CO2: 23 mmol/L (ref 22–32)
Calcium: 8.2 mg/dL — ABNORMAL LOW (ref 8.9–10.3)
Chloride: 102 mmol/L (ref 98–111)
Creatinine, Ser: 0.79 mg/dL (ref 0.61–1.24)
GFR calc Af Amer: >60 mL/min (ref >60)
GFR calc non Af Amer: >60 mL/min (ref >60)
Glucose, Bld: 150 mg/dL — ABNORMAL HIGH (ref 70–99)
Potassium: 3.6 mmol/L (ref 3.5–5.1)
Sodium: 133 mmol/L — ABNORMAL LOW (ref 135–145)

## 2019-04-19 LAB — URINE CULTURE: Culture: >=100000 — AB

## 2019-04-19 LAB — GLUCOSE, CAPILLARY
Glucose-Capillary: 147 mg/dL — ABNORMAL HIGH (ref 70–99)
Glucose-Capillary: 194 mg/dL — ABNORMAL HIGH (ref 70–99)
Glucose-Capillary: 192 mg/dL — ABNORMAL HIGH (ref 70–99)
Glucose-Capillary: 216 mg/dL — ABNORMAL HIGH (ref 70–99)

## 2019-04-19 LAB — ECHOCARDIOGRAM COMPLETE
Height: 69 in
Weight: 3867.2 oz

## 2019-04-19 MED ORDER — SODIUM CHLORIDE 0.9 % IV SOLN
INTRAVENOUS | Status: DC | PRN
  Administered 2019-04-19 – 2019-04-20 (×5): 30 mL via INTRAVENOUS
  Administered 2019-04-21 – 2019-04-22 (×4): 250 mL via INTRAVENOUS

## 2019-04-19 NOTE — Progress Notes (Signed)
[facility] [place] NAME: Jonathan Cross    MR#:  [id number]  DATE OF BIRTH:  Aug 04, 1945  SUBJECTIVE:  CHIEF COMPLAINT:   Chief Complaint  Patient presents with  . Abnormal Labs  . Diarrhea  Patient seen and evaluated today No fever Decreased abdominal pain and flank pain No vomiting  REVIEW OF SYSTEMS:    ROS  CONSTITUTIONAL: No documented fever. Has fatigue, weakness. No weight gain, no weight loss.  EYES: No blurry or double vision.  ENT: No tinnitus. No postnasal drip. No redness of the oropharynx.  RESPIRATORY: No cough, no wheeze, no hemoptysis. No dyspnea.  CARDIOVASCULAR: No chest pain. No orthopnea. No palpitations. No syncope.  GASTROINTESTINAL: No nausea, no vomiting or diarrhea. No abdominal pain. No melena or hematochezia.  GENITOURINARY: No dysuria or hematuria.  ENDOCRINE: No polyuria or nocturia. No heat or cold intolerance.  HEMATOLOGY: No anemia. No bruising. No bleeding.  INTEGUMENTARY: No rashes. No lesions.  MUSCULOSKELETAL: No arthritis. No swelling. No gout.  NEUROLOGIC: No numbness, tingling, or ataxia. No seizure-type activity.  PSYCHIATRIC: No anxiety. No insomnia. No ADD.   DRUG ALLERGIES:   Allergies  Allergen Reactions  . Metformin     Loose stools even with XR    VITALS:  Blood pressure 127/60, pulse 82, temperature 99 F (37.2 C), temperature source Oral, resp. rate 20, height 5\' 9"  (1.753 m), weight 109.6 kg, SpO2 97 %.  PHYSICAL EXAMINATION:   Physical Exam  GENERAL:  73 y.o.-year-old patient lying in the bed with no acute distress.  EYES: Pupils equal, round, reactive to light and accommodation. No scleral icterus. Extraocular muscles intact.  HEENT: Head atraumatic, normocephalic. Oropharynx and nasopharynx clear.  NECK:  Supple, no jugular venous distention. No thyroid enlargement, no tenderness.  LUNGS: Normal breath sounds bilaterally, no wheezing, rales, rhonchi. No use of  accessory muscles of respiration.  CARDIOVASCULAR: S1, S2 normal. No murmurs, rubs, or gallops.  ABDOMEN: Soft, nontender, nondistended. Bowel sounds present. No organomegaly or mass.  EXTREMITIES: No cyanosis, clubbing or edema b/l.    NEUROLOGIC: Cranial nerves II through XII are intact. No focal Motor or sensory deficits b/l.   PSYCHIATRIC: The patient is alert and oriented x 3.  SKIN: No obvious rash, lesion, or ulcer.   LABORATORY PANEL:   CBC Recent Labs  Lab 04/19/19 0337  WBC 5.4  HGB 10.9*  HCT 33.1*  PLT 133*   ------------------------------------------------------------------------------------------------------------------ Chemistries  Recent Labs  Lab 04/18/19 1008 04/19/19 0337  NA 130* 133*  K 3.4* 3.6  CL 97* 102  CO2 22 23  GLUCOSE 183* 150*  BUN 14 11  CREATININE 0.83 0.79  CALCIUM 8.2* 8.2*  AST 50*  --   ALT 39  --   ALKPHOS 45  --   BILITOT 0.9  --    ------------------------------------------------------------------------------------------------------------------  Cardiac Enzymes No results for input(s): TROPONINI in the last 168 hours. ------------------------------------------------------------------------------------------------------------------  RADIOLOGY:  Ct Chest W Contrast  Result Date: 04/17/2019 CLINICAL DATA:  73 year old male with history of acute respiratory illness. Shortness of breath. Generalized weakness and fever. EXAM: CT CHEST, ABDOMEN, AND PELVIS WITH CONTRAST TECHNIQUE: Multidetector CT imaging of the chest, abdomen and pelvis was performed following the standard protocol during bolus administration of intravenous contrast. CONTRAST:  138mL OMNIPAQUE IOHEXOL 300 MG/ML  SOLN COMPARISON:  None. FINDINGS: CT CHEST FINDINGS Cardiovascular: Heart size is normal. There is no significant pericardial fluid, thickening or pericardial calcification. There is aortic atherosclerosis, as  well as atherosclerosis of the great vessels of the  mediastinum and the coronary arteries, including calcified atherosclerotic plaque in the left main, left anterior descending, left circumflex and right coronary arteries. Status post median sternotomy for CABG including LIMA to the LAD, as well as aortic root replacement with a stented bioprosthesis. Mediastinum/Nodes: No pathologically enlarged mediastinal or hilar lymph nodes. Esophagus is unremarkable in appearance. No axillary lymphadenopathy. Lungs/Pleura: A few scattered tiny pulmonary nodules are noted in the lungs bilaterally, largest of which measures only 4 mm in the right lower lobe (axial image 97 of series 4), nonspecific. No larger more suspicious appearing pulmonary nodules or masses are noted. No acute consolidative airspace disease. No pleural effusions. Musculoskeletal: Median sternotomy wires. There are no aggressive appearing lytic or blastic lesions noted in the visualized portions of the skeleton. CT ABDOMEN PELVIS FINDINGS Hepatobiliary: 1.2 cm low-attenuation lesion in segment 4A of the liver, compatible with a simple cyst. Subcentimeter low-attenuation lesion in segment 2 of the liver, incompletely characterized on today's non-contrast CT examination, but statistically likely to represent a cyst. No other larger more suspicious appearing hepatic lesions. No intra or extrahepatic biliary ductal dilatation. Gallbladder is normal in appearance. Pancreas: No pancreatic mass. No pancreatic ductal dilatation. No pancreatic or peripancreatic fluid collections or inflammatory changes. Spleen: Unremarkable. Adrenals/Urinary Tract: Multiple right-sided renal calculi, largest of which is a staghorn calculus which extends into the renal pelvis measuring 4.0 x 2.1 x 3.9 cm (axial image 72 of series 2 and sagittal image 66 of series 6). This is associated with mild right hydronephrosis. No additional calculi are noted in the collecting system of left kidney or along the course of either ureter. No  hydroureter. 5.9 cm low-attenuation lesion in the interpolar region of the left kidney, compatible with a large peripelvic cyst. Urinary bladder is normal in appearance. Bilateral adrenal glands are normal in appearance. Stomach/Bowel: Normal appearance of the stomach. No pathologic dilatation of small bowel or colon. The appendix is not confidently identified and may be surgically absent. Regardless, there are no inflammatory changes noted adjacent to the cecum to suggest the presence of an acute appendicitis at this time. Vascular/Lymphatic: Aortic atherosclerosis, without evidence of aneurysm or dissection in the abdominal or pelvic vasculature. No lymphadenopathy noted in the abdomen or pelvis. Reproductive: Prostate gland and seminal vesicles are unremarkable in appearance. Other: No significant volume of ascites.  No pneumoperitoneum. Musculoskeletal: There are no aggressive appearing lytic or blastic lesions noted in the visualized portions of the skeleton. IMPRESSION: 1. No acute findings are noted in the thorax to account for the patient's symptoms. 2. Large calculi in the right renal collecting system, largest of which is in the staghorn calculus which extends into the renal pelvis measuring 4.0 x 2.1 x 3.9 cm which is associated with mild right proximal hydronephrosis. 3. Small pulmonary nodules in the lungs bilaterally measuring 4 mm or less in size, nonspecific, but statistically likely benign. No follow-up needed if patient is low-risk (and has no known or suspected primary neoplasm). Non-contrast chest CT can be considered in 12 months if patient is high-risk. This recommendation follows the consensus statement: Guidelines for Management of Incidental Pulmonary Nodules Detected on CT Images: From the Fleischner Society 2017; Radiology 2017; 284:228-243. 4. Aortic atherosclerosis, in addition to left main and 3 vessel coronary artery disease. Status post median sternotomy for CABG including LIMA to the  LAD. 5. Additional incidental findings, as above. Electronically Signed   By: Mauri Brooklyn.D.  On: 04/17/2019 20:21   Ct Abdomen Pelvis W Contrast  Result Date: 04/17/2019 CLINICAL DATA:  73 year old male with history of acute respiratory illness. Shortness of breath. Generalized weakness and fever. EXAM: CT CHEST, ABDOMEN, AND PELVIS WITH CONTRAST TECHNIQUE: Multidetector CT imaging of the chest, abdomen and pelvis was performed following the standard protocol during bolus administration of intravenous contrast. CONTRAST:  187mL OMNIPAQUE IOHEXOL 300 MG/ML  SOLN COMPARISON:  None. FINDINGS: CT CHEST FINDINGS Cardiovascular: Heart size is normal. There is no significant pericardial fluid, thickening or pericardial calcification. There is aortic atherosclerosis, as well as atherosclerosis of the great vessels of the mediastinum and the coronary arteries, including calcified atherosclerotic plaque in the left main, left anterior descending, left circumflex and right coronary arteries. Status post median sternotomy for CABG including LIMA to the LAD, as well as aortic root replacement with a stented bioprosthesis. Mediastinum/Nodes: No pathologically enlarged mediastinal or hilar lymph nodes. Esophagus is unremarkable in appearance. No axillary lymphadenopathy. Lungs/Pleura: A few scattered tiny pulmonary nodules are noted in the lungs bilaterally, largest of which measures only 4 mm in the right lower lobe (axial image 97 of series 4), nonspecific. No larger more suspicious appearing pulmonary nodules or masses are noted. No acute consolidative airspace disease. No pleural effusions. Musculoskeletal: Median sternotomy wires. There are no aggressive appearing lytic or blastic lesions noted in the visualized portions of the skeleton. CT ABDOMEN PELVIS FINDINGS Hepatobiliary: 1.2 cm low-attenuation lesion in segment 4A of the liver, compatible with a simple cyst. Subcentimeter low-attenuation lesion in segment  2 of the liver, incompletely characterized on today's non-contrast CT examination, but statistically likely to represent a cyst. No other larger more suspicious appearing hepatic lesions. No intra or extrahepatic biliary ductal dilatation. Gallbladder is normal in appearance. Pancreas: No pancreatic mass. No pancreatic ductal dilatation. No pancreatic or peripancreatic fluid collections or inflammatory changes. Spleen: Unremarkable. Adrenals/Urinary Tract: Multiple right-sided renal calculi, largest of which is a staghorn calculus which extends into the renal pelvis measuring 4.0 x 2.1 x 3.9 cm (axial image 72 of series 2 and sagittal image 66 of series 6). This is associated with mild right hydronephrosis. No additional calculi are noted in the collecting system of left kidney or along the course of either ureter. No hydroureter. 5.9 cm low-attenuation lesion in the interpolar region of the left kidney, compatible with a large peripelvic cyst. Urinary bladder is normal in appearance. Bilateral adrenal glands are normal in appearance. Stomach/Bowel: Normal appearance of the stomach. No pathologic dilatation of small bowel or colon. The appendix is not confidently identified and may be surgically absent. Regardless, there are no inflammatory changes noted adjacent to the cecum to suggest the presence of an acute appendicitis at this time. Vascular/Lymphatic: Aortic atherosclerosis, without evidence of aneurysm or dissection in the abdominal or pelvic vasculature. No lymphadenopathy noted in the abdomen or pelvis. Reproductive: Prostate gland and seminal vesicles are unremarkable in appearance. Other: No significant volume of ascites.  No pneumoperitoneum. Musculoskeletal: There are no aggressive appearing lytic or blastic lesions noted in the visualized portions of the skeleton. IMPRESSION: 1. No acute findings are noted in the thorax to account for the patient's symptoms. 2. Large calculi in the right renal  collecting system, largest of which is in the staghorn calculus which extends into the renal pelvis measuring 4.0 x 2.1 x 3.9 cm which is associated with mild right proximal hydronephrosis. 3. Small pulmonary nodules in the lungs bilaterally measuring 4 mm or less in size, nonspecific, but statistically  likely benign. No follow-up needed if patient is low-risk (and has no known or suspected primary neoplasm). Non-contrast chest CT can be considered in 12 months if patient is high-risk. This recommendation follows the consensus statement: Guidelines for Management of Incidental Pulmonary Nodules Detected on CT Images: From the Fleischner Society 2017; Radiology 2017; 284:228-243. 4. Aortic atherosclerosis, in addition to left main and 3 vessel coronary artery disease. Status post median sternotomy for CABG including LIMA to the LAD. 5. Additional incidental findings, as above. Electronically Signed   By: Vinnie Langton M.D.   On: 04/17/2019 20:21   Dg Chest Portable 1 View  Result Date: 04/18/2019 CLINICAL DATA:  Pt was called back to the ED with positive blood cultures, pt has continued diarrhea with generalized weakness and fever since seen here 10/4.hx of hypertension, CAD. Non smoker EXAM: PORTABLE CHEST 1 VIEW COMPARISON:  Chest radiograph 04/17/2019 FINDINGS: Stable cardiomediastinal contours status post median sternotomy. The lungs are clear. No pneumothorax or pleural effusion. No acute finding in the visualized skeleton. IMPRESSION: No evidence of active disease. Electronically Signed   By: Audie Pinto M.D.   On: 04/18/2019 11:58   Dg Chest Port 1 View  Result Date: 04/17/2019 CLINICAL DATA:  Fever, cough EXAM: PORTABLE CHEST 1 VIEW COMPARISON:  None. FINDINGS: Cardiomegaly status post median sternotomy and CABG. Both lungs are clear. The visualized skeletal structures are unremarkable. IMPRESSION: Cardiomegaly without acute abnormality of the lungs in AP portable projection. Electronically  Signed   By: Eddie Candle M.D.   On: 04/17/2019 16:20     ASSESSMENT AND PLAN:  73 year old male patient with history of type 2 diabetes mellitus, hypertension, aortic valve replacement, CABG currently under hospitalist service.  He was recently discharged home on oral antibiotics for UTI and pneumonia but blood cultures came back positive with enterococcus.  -Enterococcal bacteremia Follow-up echocardiogram to assess for any vegetations Continue ampicillin antibiotic Infectious disease follow-up COVID-19 test negative  -Sepsis secondary to bacteremia IV fluids and antibiotics  -Staghorn calculus with mild hydronephrosis Urology consult  -Diarrhea  C. difficile test pending  -DVT prophylaxis subcu Lovenox daily   All the records are reviewed and case discussed with Care Management/Social Worker. Management plans discussed with the patient, family and they are in agreement.  CODE STATUS: Full code  DVT Prophylaxis: SCDs  TOTAL TIME TAKING CARE OF THIS PATIENT: 37 minutes.   POSSIBLE D/C IN 2 to 3 DAYS, DEPENDING ON CLINICAL CONDITION.  Saundra Shelling M.D on 04/19/2019 at 9:46 AM  Between 7am to 6pm - Pager - [phone]  After 6pm go to [email or web address] - password [email or web address]  Office  [phone]  CC: Primary care physician; Patient, No Pcp Per  Note: This dictation was prepared with Dragon dictation along with smaller phrase technology. Any transcriptional errors that result from this process are unintentional.

## 2019-04-19 NOTE — Progress Notes (Signed)
   Date of Admission:  04/18/2019     Antibiotic : Ampicillin -Started 04/18/19   Subjective: Having trouble passing urine No fever  Medications:  . aspirin EC  81 mg Oral Daily  . atorvastatin  80 mg Oral Daily  . enoxaparin (LOVENOX) injection  40 mg Subcutaneous Q24H  . insulin aspart  0-15 Units Subcutaneous TID WC  . insulin aspart  0-5 Units Subcutaneous QHS  . insulin aspart protamine- aspart  60 Units Subcutaneous BID WC  . isosorbide mononitrate  30 mg Oral Daily  . loratadine  10 mg Oral Daily  . metoprolol succinate  25 mg Oral Daily  . pantoprazole  40 mg Oral Daily    Objective: Vital signs in last 24 hours: Temp:  [98 F (36.7 C)-99 F (37.2 C)] 98 F (36.7 C) (10/06 1320) Pulse Rate:  [82-94] 94 (10/06 1320) Resp:  [20] 20 (10/06 1320) BP: (127-150)/(60-83) 150/83 (10/06 1320) SpO2:  [93 %-97 %] 93 % (10/06 1320) Weight:  [109.6 kg-111.3 kg] 109.6 kg (10/06 0401)  PHYSICAL EXAM:  General: Alert, cooperative, no distress, appears stated age.   Lab Results Recent Labs    04/18/19 1008 04/19/19 0337  WBC 5.1 5.4  HGB 10.8* 10.9*  HCT 32.8* 33.1*  NA 130* 133*  K 3.4* 3.6  CL 97* 102  CO2 22 23  BUN 14 11  CREATININE 0.83 0.79   Liver Panel Recent Labs    04/17/19 1527 04/18/19 1008  PROT 7.5 6.9  ALBUMIN 3.6 3.1*  AST 40 50*  ALT 33 39  ALKPHOS 49 45  BILITOT 0.9 0.9   Sedimentation Rate No results for input(s): ESRSEDRATE in the last 72 hours. C-Reactive Protein No results for input(s): CRP in the last 72 hours.  Microbiology:  Studies/Results: Dg Chest Portable 1 View  Result Date: 04/18/2019 CLINICAL DATA:  Pt was called back to the ED with positive blood cultures, pt has continued diarrhea with generalized weakness and fever since seen here 10/4.hx of hypertension, CAD. Non smoker EXAM: PORTABLE CHEST 1 VIEW COMPARISON:  Chest radiograph 04/17/2019 FINDINGS: Stable cardiomediastinal contours status post median sternotomy. The  lungs are clear. No pneumothorax or pleural effusion. No acute finding in the visualized skeleton. IMPRESSION: No evidence of active disease. Electronically Signed   By: Audie Pinto M.D.   On: 04/18/2019 11:58     Assessment/Plan:  Enterococcus bacteremia- continue ampicillin, TEE tomorrow- Will repeat BC Enterococcus in urine R/o urinary incomplete emptying due to BPH  AVR

## 2019-04-19 NOTE — Progress Notes (Signed)
PHARMACY - PHYSICIAN COMMUNICATION CRITICAL VALUE ALERT - BLOOD CULTURE IDENTIFICATION (BCID)  Jonathan Cross. is an 73 y.o. male who presented to [facility] on 04/18/2019 with a chief complaint of diarrhea/abnormal labs  Assessment:  Patient is growing GPC in 5th anaerobic bottle (out of 8), BCID Enterococcus vanc resistance NOT detected.  Name of physician (or Provider) Contacted: ID is following patient and is currently on appropriate abx per culture results.  Current antibiotics: ampicillin 2g IV q4h  Changes to prescribed antibiotics recommended:  Patient is on recommended antibiotics - No changes needed  Results for orders placed or performed during the hospital encounter of 04/18/19  Blood Culture ID Panel (Reflexed) (Collected: 04/18/2019 10:09 AM)  Result Value Ref Range   Enterococcus species DETECTED (A) NOT DETECTED   Vancomycin resistance NOT DETECTED NOT DETECTED   Listeria monocytogenes NOT DETECTED NOT DETECTED   Staphylococcus species NOT DETECTED NOT DETECTED   Staphylococcus aureus (BCID) NOT DETECTED NOT DETECTED   Streptococcus species NOT DETECTED NOT DETECTED   Streptococcus agalactiae NOT DETECTED NOT DETECTED   Streptococcus pneumoniae NOT DETECTED NOT DETECTED   Streptococcus pyogenes NOT DETECTED NOT DETECTED   Acinetobacter baumannii NOT DETECTED NOT DETECTED   Enterobacteriaceae species NOT DETECTED NOT DETECTED   Enterobacter cloacae complex NOT DETECTED NOT DETECTED   Escherichia coli NOT DETECTED NOT DETECTED   Klebsiella oxytoca NOT DETECTED NOT DETECTED   Klebsiella pneumoniae NOT DETECTED NOT DETECTED   Proteus species NOT DETECTED NOT DETECTED   Serratia marcescens NOT DETECTED NOT DETECTED   Haemophilus influenzae NOT DETECTED NOT DETECTED   Neisseria meningitidis NOT DETECTED NOT DETECTED   Pseudomonas aeruginosa NOT DETECTED NOT DETECTED   Candida albicans NOT DETECTED NOT DETECTED   Candida glabrata NOT DETECTED NOT DETECTED   Candida krusei NOT DETECTED NOT DETECTED   Candida parapsilosis NOT DETECTED NOT DETECTED   Candida tropicalis NOT DETECTED NOT DETECTED   Tobie Lords, PharmD, BCPS Clinical Pharmacist 04/19/2019  6:44 AM

## 2019-04-19 NOTE — Progress Notes (Signed)
*  PRELIMINARY RESULTS* Echocardiogram 2D Echocardiogram has been performed.  Jonathan Cross Jonathan Cross 04/19/2019, 9:30 AM

## 2019-04-20 ENCOUNTER — Telehealth: Payer: Self-pay | Admitting: Physician Assistant

## 2019-04-20 DIAGNOSIS — N200 Calculus of kidney: Secondary | ICD-10-CM

## 2019-04-20 DIAGNOSIS — N390 Urinary tract infection, site not specified: Secondary | ICD-10-CM

## 2019-04-20 LAB — BASIC METABOLIC PANEL
Anion gap: 7 (ref 5–15)
BUN: 12 mg/dL (ref 8–23)
CO2: 26 mmol/L (ref 22–32)
Calcium: 9 mg/dL (ref 8.9–10.3)
Chloride: 106 mmol/L (ref 98–111)
Creatinine, Ser: 0.79 mg/dL (ref 0.61–1.24)
GFR calc Af Amer: >60 mL/min (ref >60)
GFR calc non Af Amer: >60 mL/min (ref >60)
Glucose, Bld: 163 mg/dL — ABNORMAL HIGH (ref 70–99)
Potassium: 4.1 mmol/L (ref 3.5–5.1)
Sodium: 139 mmol/L (ref 135–145)

## 2019-04-20 LAB — GLUCOSE, CAPILLARY
Glucose-Capillary: 156 mg/dL — ABNORMAL HIGH (ref 70–99)
Glucose-Capillary: 227 mg/dL — ABNORMAL HIGH (ref 70–99)
Glucose-Capillary: 185 mg/dL — ABNORMAL HIGH (ref 70–99)
Glucose-Capillary: 204 mg/dL — ABNORMAL HIGH (ref 70–99)

## 2019-04-20 LAB — CULTURE, BLOOD (ROUTINE X 2)
Special Requests: ADEQUATE
Special Requests: ADEQUATE
Special Requests: ADEQUATE

## 2019-04-20 LAB — CBC
HCT: 34.5 % — ABNORMAL LOW (ref 39.0–52.0)
Hemoglobin: 11.4 g/dL — ABNORMAL LOW (ref 13.0–17.0)
MCH: 34 pg (ref 26.0–34.0)
MCHC: 33 g/dL (ref 30.0–36.0)
MCV: 103 fL — ABNORMAL HIGH (ref 80.0–100.0)
Platelets: 142 10*3/uL — ABNORMAL LOW (ref 150–400)
RBC: 3.35 MIL/uL — ABNORMAL LOW (ref 4.22–5.81)
RDW: 14.2 % (ref 11.5–15.5)
WBC: 4.6 10*3/uL (ref 4.0–10.5)
nRBC: 0 % (ref 0.0–0.2)

## 2019-04-20 LAB — URINE CULTURE: Culture: >=100000 — AB

## 2019-04-20 MED ORDER — INSULIN ASPART PROT & ASPART (70-30 MIX) 100 UNIT/ML ~~LOC~~ SUSP
30.0000 [IU] | Freq: Two times a day (BID) | SUBCUTANEOUS | Status: DC
  Administered 2019-04-20 – 2019-04-23 (×6): 30 [IU] via SUBCUTANEOUS
  Filled 2019-04-20 (×6): qty 10

## 2019-04-20 NOTE — Telephone Encounter (Signed)
Please schedule this patient for outpatient f/u with an MD to discuss management of his staghorn stone. We should see him in 1-2 weeks.

## 2019-04-20 NOTE — Progress Notes (Signed)
[facility] [place] NAME: Jonathan Cross    MR#:  [id number]  DATE OF BIRTH:  02/21/1946  SUBJECTIVE:  CHIEF COMPLAINT:   Chief Complaint  Patient presents with  . Abnormal Labs  . Diarrhea  Patient seen and evaluated today No fever Decreased abdominal pain and flank pain No vomiting Tolerating diet ok  REVIEW OF SYSTEMS:    ROS  CONSTITUTIONAL: No documented fever. Has fatigue, weakness. No weight gain, no weight loss.  EYES: No blurry or double vision.  ENT: No tinnitus. No postnasal drip. No redness of the oropharynx.  RESPIRATORY: No cough, no wheeze, no hemoptysis. No dyspnea.  CARDIOVASCULAR: No chest pain. No orthopnea. No palpitations. No syncope.  GASTROINTESTINAL: No nausea, no vomiting or diarrhea. No abdominal pain. No melena or hematochezia.  GENITOURINARY: No dysuria or hematuria.  ENDOCRINE: No polyuria or nocturia. No heat or cold intolerance.  HEMATOLOGY: No anemia. No bruising. No bleeding.  INTEGUMENTARY: No rashes. No lesions.  MUSCULOSKELETAL: No arthritis. No swelling. No gout.  NEUROLOGIC: No numbness, tingling, or ataxia. No seizure-type activity.  PSYCHIATRIC: No anxiety. No insomnia. No ADD.   DRUG ALLERGIES:   Allergies  Allergen Reactions  . Metformin     Loose stools even with XR    VITALS:  Blood pressure (!) 145/76, pulse 79, temperature 97.7 F (36.5 C), temperature source Oral, resp. rate 20, height 5\' 9"  (1.753 m), weight 107.4 kg, SpO2 96 %.  PHYSICAL EXAMINATION:   Physical Exam  GENERAL:  73 y.o.-year-old patient lying in the bed with no acute distress.  EYES: Pupils equal, round, reactive to light and accommodation. No scleral icterus. Extraocular muscles intact.  HEENT: Head atraumatic, normocephalic. Oropharynx and nasopharynx clear.  NECK:  Supple, no jugular venous distention. No thyroid enlargement, no tenderness.  LUNGS: Normal breath sounds bilaterally, no wheezing, rales,  rhonchi. No use of accessory muscles of respiration.  CARDIOVASCULAR: S1, S2 normal. No murmurs, rubs, or gallops.  ABDOMEN: Soft, nontender, nondistended. Bowel sounds present. No organomegaly or mass.  EXTREMITIES: No cyanosis, clubbing or edema b/l.    NEUROLOGIC: Cranial nerves II through XII are intact. No focal Motor or sensory deficits b/l.   PSYCHIATRIC: The patient is alert and oriented x 3.  SKIN: No obvious rash, lesion, or ulcer.   LABORATORY PANEL:   CBC Recent Labs  Lab 04/20/19 0339  WBC 4.6  HGB 11.4*  HCT 34.5*  PLT 142*   ------------------------------------------------------------------------------------------------------------------ Chemistries  Recent Labs  Lab 04/18/19 1008  04/20/19 0339  NA 130*   < > 139  K 3.4*   < > 4.1  CL 97*   < > 106  CO2 22   < > 26  GLUCOSE 183*   < > 163*  BUN 14   < > 12  CREATININE 0.83   < > 0.79  CALCIUM 8.2*   < > 9.0  AST 50*  --   --   ALT 39  --   --   ALKPHOS 45  --   --   BILITOT 0.9  --   --    < > = values in this interval not displayed.   ------------------------------------------------------------------------------------------------------------------  Cardiac Enzymes No results for input(s): TROPONINI in the last 168 hours. ------------------------------------------------------------------------------------------------------------------  RADIOLOGY:  No results found.   ASSESSMENT AND PLAN:  73 year old male patient with history of type 2 diabetes mellitus, hypertension, aortic valve replacement, CABG currently under hospitalist service.  He was recently  discharged home on oral antibiotics for UTI and pneumonia but blood cultures came back positive with enterococcus.  -Enterococcal bacteremia Continue ampicillin antibiotic Infectious disease follow-up appreciated COVID-19 test negative Trans thoracic echo shows no vegetations, EF 60 to 65% -Sepsis secondary to bacteremia IV fluids and  antibiotics  -Staghorn calculus with mild hydronephrosis Urology consult appreciated Out patient f/u  -Diarrhea resolved  -DVT prophylaxis subcu Lovenox daily   All the records are reviewed and case discussed with Care Management/Social Worker. Management plans discussed with the patient, family and they are in agreement.  CODE STATUS: Full code  DVT Prophylaxis: SCDs  TOTAL TIME TAKING CARE OF THIS PATIENT: 36 minutes.   POSSIBLE D/C IN 2 to 3 DAYS, DEPENDING ON CLINICAL CONDITION.  Saundra Shelling M.D on 04/20/2019 at 10:44 AM  Between 7am to 6pm - Pager - [phone]  After 6pm go to [email or web address] - password [email or web address] [facility]  Office  [phone]  CC: Primary care physician; Patient, No Pcp Per  Note: This dictation was prepared with Dragon dictation along with smaller phrase technology. Any transcriptional errors that result from this process are unintentional.

## 2019-04-20 NOTE — Progress Notes (Addendum)
Inpatient Diabetes Program Recommendations  AACE/ADA: New Consensus Statement on Inpatient Glycemic Control (2015)  Target Ranges:  Prepandial:   less than 140 mg/dL      Peak postprandial:   less than 180 mg/dL (1-2 hours)      Critically ill patients:  140 - 180 mg/dL   Results for Jonathan Cross, Jonathan Cross (MRN [id number]) as of 04/20/2019 09:20  Ref. Range 04/19/2019 07:39 04/19/2019 11:49 04/19/2019 16:39 04/19/2019 21:35  Glucose-Capillary Latest Ref Range: 70 - 99 mg/dL 147 (H) 194 (H) 192 (H) 216 (H)   Results for Jonathan Cross, Jonathan Cross (MRN [id number]) as of 04/20/2019 09:20  Ref. Range 04/20/2019 07:44  Glucose-Capillary Latest Ref Range: 70 - 99 mg/dL 156 (H)    Home DM Meds: 70/30 Insulin 85 AM/ 80PM    Current Orders: 70/30 Insulin 60 units BID      Novolog Moderate Correction Scale/ SSI (0-15 units) TID AC + HS     PCP: Dr. Coralee North with [facility]--Seen 12/20/2018 and 70/30 Insulin doses increased--Seen 04/14/2019 and Jardiance 10 mg QD added to regimen for home since A1c was elevated     MD- Please note that 70/30 Insulin NOT given yesterday (Pt refused AM dose and then PM dose held due to patient not eating)  AM dose today Held by RN due to poor PO intake.  CBG only 156 mg/dl this AM.  May consider reducing 70/30 Insulin to 30 units BID with meals until PO intake improves     --Will follow patient during hospitalization--  Wyn Quaker RN, MSN, CDE Diabetes Coordinator Inpatient Glycemic Control Team Team Pager: [phone] (8a-5p)

## 2019-04-20 NOTE — Progress Notes (Signed)
   Date of Admission:  04/18/2019   Total days of antibiotics 3     Subjective: Says he is okay Had some sob this morning Some difficulty in passing urine  Medications:  . aspirin EC  81 mg Oral Daily  . atorvastatin  80 mg Oral Daily  . enoxaparin (LOVENOX) injection  40 mg Subcutaneous Q24H  . insulin aspart  0-15 Units Subcutaneous TID WC  . insulin aspart  0-5 Units Subcutaneous QHS  . insulin aspart protamine- aspart  30 Units Subcutaneous BID WC  . isosorbide mononitrate  30 mg Oral Daily  . loratadine  10 mg Oral Daily  . metoprolol succinate  25 mg Oral Daily  . pantoprazole  40 mg Oral Daily    Objective: Vital signs in last 24 hours: Temp:  [97.7 F (36.5 C)-98.1 F (36.7 C)] 98.1 F (36.7 C) (10/07 1241) Pulse Rate:  [79-89] 86 (10/07 1241) Resp:  [20] 20 (10/07 1241) BP: (141-152)/(76-81) 141/81 (10/07 1241) SpO2:  [94 %-96 %] 94 % (10/07 1241) Weight:  [107.4 kg] 107.4 kg (10/07 0455)  PHYSICAL EXAM:  General: Alert, cooperative, no distress, appears stated age.  Head: Normocephalic, without obvious abnormality, atraumatic. Eyes: Conjunctivae clear, anicteric sclerae. Pupils are equal ENT Nares normal. No drainage or sinus tenderness. Lips, mucosa, and tongue normal. No Thrush Neck: Supple, symmetrical, no adenopathy, thyroid: non tender no carotid bruit and no JVD. Back: No CVA tenderness. Lungs: b/la ir entry- crepts bases Sternal scar. Heart: Regular rate and rhythm, no murmur, rub or gallop. Abdomen: Soft, non-tender,not distended. Bowel sounds normal. No masses Extremities: atraumatic, no cyanosis. No edema. No clubbing Skin: No rashes or lesions. Or bruising Lymph: Cervical, supraclavicular normal. Neurologic: Grossly non-focal  Lab Results Recent Labs    04/19/19 0337 04/20/19 0339  WBC 5.4 4.6  HGB 10.9* 11.4*  HCT 33.1* 34.5*  NA 133* 139  K 3.6 4.1  CL 102 106  CO2 23 26  BUN 11 12  CREATININE 0.79 0.79   Liver Panel Recent  Labs    04/18/19 1008  PROT 6.9  ALBUMIN 3.1*  AST 50*  ALT 39  ALKPHOS 45  BILITOT 0.9    Microbiology: Enterococcus fecalis BC positive 10/4 and 10/5 UC positive for the same Studies/Results: No results found.   Assessment/Plan: Enterococcus Faecalis  bacteremia due to urinary tract infection. On iV ampicillin As he has prosthetic arotic valve needs TEE  Enterococcus UTI with stag horn calculus R/o BPH Bladder scan was reported as zero- will repeat another one  Discussed the management with the patient and hospitalist

## 2019-04-20 NOTE — Telephone Encounter (Signed)
Appt made and mailed to pt. I tried to call him but,no answer.

## 2019-04-21 ENCOUNTER — Encounter
Admission: EM | Disposition: A | Discharge status: Home / Self Care | Payer: Self-pay | Source: Home / Self Care | Attending: Internal Medicine

## 2019-04-21 ENCOUNTER — Inpatient Hospital Stay (HOSPITAL_COMMUNITY)
Admit: 2019-04-21 | Discharge: 2019-04-21 | Disposition: A | Discharge status: Home / Self Care | Payer: Medicare HMO | Attending: Cardiovascular Disease | Admitting: Cardiovascular Disease

## 2019-04-21 DIAGNOSIS — I340 Nonrheumatic mitral (valve) insufficiency: Secondary | ICD-10-CM

## 2019-04-21 DIAGNOSIS — R7881 Bacteremia: Secondary | ICD-10-CM

## 2019-04-21 HISTORY — PX: TEE WITHOUT CARDIOVERSION: SHX5443

## 2019-04-21 LAB — GLUCOSE, CAPILLARY
Glucose-Capillary: 195 mg/dL — ABNORMAL HIGH (ref 70–99)
Glucose-Capillary: 217 mg/dL — ABNORMAL HIGH (ref 70–99)
Glucose-Capillary: 174 mg/dL — ABNORMAL HIGH (ref 70–99)
Glucose-Capillary: 128 mg/dL — ABNORMAL HIGH (ref 70–99)
Glucose-Capillary: 177 mg/dL — ABNORMAL HIGH (ref 70–99)

## 2019-04-21 LAB — C-REACTIVE PROTEIN: CRP: 2.5 mg/dL — ABNORMAL HIGH (ref <1.0)

## 2019-04-21 LAB — SEDIMENTATION RATE: Sed Rate: 85 mm/hr — ABNORMAL HIGH (ref 0–20)

## 2019-04-21 SURGERY — ECHOCARDIOGRAM, TRANSESOPHAGEAL
Anesthesia: Moderate Sedation

## 2019-04-21 MED ORDER — BUTAMBEN-TETRACAINE-BENZOCAINE 2-2-14 % EX AERO
INHALATION_SPRAY | CUTANEOUS | Status: AC
  Filled 2019-04-21: qty 5

## 2019-04-21 MED ORDER — SODIUM CHLORIDE 0.9 % IV SOLN: INTRAVENOUS | Status: DC

## 2019-04-21 MED ORDER — MIDAZOLAM HCL 5 MG/5ML IJ SOLN
INTRAMUSCULAR | Status: AC | PRN
  Administered 2019-04-21 (×4): 1 mg via INTRAVENOUS

## 2019-04-21 MED ORDER — LIDOCAINE VISCOUS HCL 2 % MT SOLN
OROMUCOSAL | Status: AC
  Filled 2019-04-21: qty 15

## 2019-04-21 MED ORDER — LIDOCAINE VISCOUS HCL 2 % MT SOLN
OROMUCOSAL | Status: AC | PRN
  Administered 2019-04-21: 30 mL via OROMUCOSAL

## 2019-04-21 MED ORDER — FENTANYL CITRATE (PF) 100 MCG/2ML IJ SOLN
INTRAMUSCULAR | Status: AC
  Filled 2019-04-21: qty 4

## 2019-04-21 MED ORDER — MIDAZOLAM HCL 2 MG/2ML IJ SOLN
INTRAMUSCULAR | Status: AC | PRN
  Administered 2019-04-21: 1 mg via INTRAVENOUS

## 2019-04-21 MED ORDER — BUTAMBEN-TETRACAINE-BENZOCAINE 2-2-14 % EX AERO
INHALATION_SPRAY | CUTANEOUS | Status: AC | PRN
  Administered 2019-04-21: 4 via TOPICAL

## 2019-04-21 MED ORDER — MIDAZOLAM HCL 5 MG/5ML IJ SOLN
INTRAMUSCULAR | Status: AC
  Filled 2019-04-21: qty 10

## 2019-04-21 MED ORDER — FENTANYL CITRATE (PF) 100 MCG/2ML IJ SOLN
INTRAMUSCULAR | Status: AC | PRN
  Administered 2019-04-21 (×5): 25 ug via INTRAVENOUS

## 2019-04-21 NOTE — Progress Notes (Signed)
[place] [facility] [place] NAME: Jonathan Cross    MR#:  [id number]  DATE OF BIRTH:  11/08/1945  SUBJECTIVE:  CHIEF COMPLAINT:   Chief Complaint  Patient presents with  . Abnormal Labs  . Diarrhea  Patient seen and evaluated today No fever Decreased abdominal pain and flank pain No vomiting Awake and responds to all verbal commands Not lethargic this morning  REVIEW OF SYSTEMS:    ROS  CONSTITUTIONAL: No documented fever. Has fatigue, weakness. No weight gain, no weight loss.  EYES: No blurry or double vision.  ENT: No tinnitus. No postnasal drip. No redness of the oropharynx.  RESPIRATORY: No cough, no wheeze, no hemoptysis. No dyspnea.  CARDIOVASCULAR: No chest pain. No orthopnea. No palpitations. No syncope.  GASTROINTESTINAL: No nausea, no vomiting or diarrhea. No abdominal pain. No melena or hematochezia.  GENITOURINARY: No dysuria or hematuria.  ENDOCRINE: No polyuria or nocturia. No heat or cold intolerance.  HEMATOLOGY: No anemia. No bruising. No bleeding.  INTEGUMENTARY: No rashes. No lesions.  MUSCULOSKELETAL: No arthritis. No swelling. No gout.  NEUROLOGIC: No numbness, tingling, or ataxia. No seizure-type activity.  PSYCHIATRIC: No anxiety. No insomnia. No ADD.   DRUG ALLERGIES:   Allergies  Allergen Reactions  . Metformin     Loose stools even with XR    VITALS:  Blood pressure 139/80, pulse 80, temperature (!) 97.5 F (36.4 C), temperature source Oral, resp. rate 20, height 5\' 9"  (1.753 m), weight 108.1 kg, SpO2 94 %.  PHYSICAL EXAMINATION:   Physical Exam  GENERAL:  73 y.o.-year-old patient lying in the bed with no acute distress.  EYES: Pupils equal, round, reactive to light and accommodation. No scleral icterus. Extraocular muscles intact.  HEENT: Head atraumatic, normocephalic. Oropharynx and nasopharynx clear.  NECK:  Supple, no jugular venous distention. No thyroid enlargement, no tenderness.  LUNGS:  Normal breath sounds bilaterally, no wheezing, rales, rhonchi. No use of accessory muscles of respiration.  CARDIOVASCULAR: S1, S2 normal. No murmurs, rubs, or gallops.  ABDOMEN: Soft, nontender, nondistended. Bowel sounds present. No organomegaly or mass.  EXTREMITIES: No cyanosis, clubbing or edema b/l.    NEUROLOGIC: Cranial nerves II through XII are intact. No focal Motor or sensory deficits b/l.   PSYCHIATRIC: The patient is alert and oriented x 3.  SKIN: No obvious rash, lesion, or ulcer.   LABORATORY PANEL:   CBC Recent Labs  Lab 04/20/19 0339  WBC 4.6  HGB 11.4*  HCT 34.5*  PLT 142*   ------------------------------------------------------------------------------------------------------------------ Chemistries  Recent Labs  Lab 04/18/19 1008  04/20/19 0339  NA 130*   < > 139  K 3.4*   < > 4.1  CL 97*   < > 106  CO2 22   < > 26  GLUCOSE 183*   < > 163*  BUN 14   < > 12  CREATININE 0.83   < > 0.79  CALCIUM 8.2*   < > 9.0  AST 50*  --   --   ALT 39  --   --   ALKPHOS 45  --   --   BILITOT 0.9  --   --    < > = values in this interval not displayed.   ------------------------------------------------------------------------------------------------------------------  Cardiac Enzymes No results for input(s): TROPONINI in the last 168 hours. ------------------------------------------------------------------------------------------------------------------  RADIOLOGY:  No results found.   ASSESSMENT AND PLAN:  73 year old male patient with history of type 2 diabetes mellitus, hypertension, aortic valve replacement, CABG  currently under hospitalist service.  He was recently discharged home on oral antibiotics for UTI and pneumonia but blood cultures came back positive with enterococcus.  -Enterococcal bacteremia Continue ampicillin antibiotic Infectious disease follow-up appreciated COVID-19 test negative Trans thoracic echo shows no vegetations, EF 60 to  65%  -Sepsis secondary to bacteremia improving IV fluids and antibiotics continue Cardiology consult for transesophageal echo as recommended by infectious disease attending  -Staghorn calculus with mild hydronephrosis Urology consult appreciated Out patient f/u, no intervention recommended currently  -Diarrhea resolved  -DVT prophylaxis subcu Lovenox daily   All the records are reviewed and case discussed with Care Management/Social Worker. Management plans discussed with the patient, family and they are in agreement.  CODE STATUS: Full code  DVT Prophylaxis: SCDs  TOTAL TIME TAKING CARE OF THIS PATIENT: 37 minutes.   POSSIBLE D/C IN 2 to 3 DAYS, DEPENDING ON CLINICAL CONDITION.  Saundra Shelling M.D on 04/21/2019 at 9:52 AM  Between 7am to 6pm - Pager - [phone]  After 6pm go to [email or web address] - password [email or web address] [facility]  Office  [phone]  CC: Primary care physician; Patient, No Pcp Per  Note: This dictation was prepared with Dragon dictation along with smaller phrase technology. Any transcriptional errors that result from this process are unintentional.

## 2019-04-21 NOTE — Progress Notes (Signed)
Patient returned back to his room post TEE

## 2019-04-21 NOTE — Care Management Important Message (Signed)
Important Message  Patient Details  Name: Jonathan Cross. MRN: [id number] Date of Birth: Dec 08, 1945   Medicare Important Message Given:  Yes     Dannette Barbara 04/21/2019, 1:34 PM

## 2019-04-21 NOTE — Progress Notes (Addendum)
Pt with progressive dry cough. Lethargic but responsive to commands and denies pain.  02 sat 94-96% on 4 LNC weaning down. Pt no SOB, lung sounds clear. Dr. Rockey Situ paged to bedside to assess. No orders at this time. Per MD floor RN to give PRN Albuterol NEB if coughing persist. Ok to have Ice chips at 1630 and progress to diet at [employer] per MD.Jodie RN updated at bedside report 2 C. Care assumed per floor RN.

## 2019-04-21 NOTE — Progress Notes (Signed)
Patient stated he was feeling like his heart was racing and his face felt numb. When I got in the room he said it was subsiding. Vital signs were stable.Talked to Dr. Estanislado Pandy who said to continue to monitor him.

## 2019-04-21 NOTE — Progress Notes (Signed)
   Date of Admission:  04/18/2019        Subjective: Pt says he is feeling better Less sob Better urination Had TEE and it was negative for vegetation  Medications:  . aspirin EC  81 mg Oral Daily  . atorvastatin  80 mg Oral Daily  . butamben-tetracaine-benzocaine      . enoxaparin (LOVENOX) injection  40 mg Subcutaneous Q24H  . fentaNYL      . insulin aspart  0-15 Units Subcutaneous TID WC  . insulin aspart  0-5 Units Subcutaneous QHS  . insulin aspart protamine- aspart  30 Units Subcutaneous BID WC  . isosorbide mononitrate  30 mg Oral Daily  . lidocaine      . loratadine  10 mg Oral Daily  . metoprolol succinate  25 mg Oral Daily  . midazolam      . pantoprazole  40 mg Oral Daily    Objective: Vital signs in last 24 hours: Temp:  [97.5 F (36.4 C)-98.4 F (36.9 C)] 97.9 F (36.6 C) (10/08 1621) Pulse Rate:  [77-98] 77 (10/08 1621) Resp:  [12-22] 17 (10/08 1621) BP: (91-197)/(53-111) 104/57 (10/08 1621) SpO2:  [93 %-100 %] 96 % (10/08 1621) Weight:  [108.1 kg] 108.1 kg (10/08 1409)  PHYSICAL EXAM:  General: Alert, cooperative, no distress, appears stated age. Nasal oxygen Head: Normocephalic, without obvious abnormality, atraumatic. Eyes: Conjunctivae clear, anicteric sclerae. Pupils are equal ENT Nares normal. No drainage or sinus tenderness. Lips, mucosa, and tongue normal. No Thrush Neck: Supple, symmetrical, no adenopathy, thyroid: non tender no carotid bruit and no JVD. Back: No CVA tenderness. Lungs: Cb/l air entry - crepts bases Heart: Regular rate and rhythm, no murmur, rub or gallop. Abdomen: Soft, non-tender,not distended. Bowel sounds normal. No masses Extremities: atraumatic, no cyanosis. No edema. No clubbing Skin: No rashes or lesions. Or bruising Lymph: Cervical, supraclavicular normal. Neurologic: Grossly non-focal  Lab Results Recent Labs    04/19/19 0337 04/20/19 0339  WBC 5.4 4.6  HGB 10.9* 11.4*  HCT 33.1* 34.5*  NA 133* 139  K  3.6 4.1  CL 102 106  CO2 23 26  BUN 11 12  CREATININE 0.79 0.79   Liver Panel No results for input(s): PROT, ALBUMIN, AST, ALT, ALKPHOS, BILITOT, BILIDIR, IBILI in the last 72 hours. Sedimentation Rate Recent Labs    04/21/19 0343  ESRSEDRATE 85*   C-Reactive Protein Recent Labs    04/21/19 0343  CRP 2.5*    Microbiology:  Studies/Results: No results found.   Assessment/Plan: Enterococcus Faecalis  bacteremia due to urinary tract infection. On iV ampicillin day 4- will need IV for another week TEE negative for vegetation!  Enterococcus UTI with stag horn calculus  Bladder scan was reported as zero- will repeat another one  Discussed the management with the patient and hospitalist

## 2019-04-21 NOTE — Progress Notes (Signed)
Transesophageal Echocardiogram :  Indication: bacteremia, r/o endocarditis Requesting/ordering  physician:   Procedure: Benzocaine spray x2 and 2 mls x 2 of viscous lidocaine were given orally to provide local anesthesia to the oropharynx. The patient was positioned supine on the left side, bite block provided. The patient was moderately sedated with the doses of versed and fentanyl as detailed below.  Using digital technique an omniplane probe was advanced into the distal esophagus without incident.   Moderate sedation: 1. Sedation used:  Versed: 5 mg, Fentanyl: 125 ug 2. Time administered: 2 pm    Time when patient started recovery:2:45, total sedation time 45 min 3. I was face to face during this time [phone]  See report in EPIC  for complete details: In brief, No valve vegetation noted. Bioprosthetic aortic valve (TAVR) functioning normally,, no vegetation noted.  transgastric imaging revealed normal LV function with no RWMAs and no mural apical thrombus.  .  Estimated ejection fraction was 55%.  Right sided cardiac chambers were normal with no evidence of pulmonary hypertension.  Imaging of the septum showed no ASD or VSD Bubble study was negative for shunt 2D and color flow confirmed no PFO  The LA was well visualized in orthogonal views.  There was no spontaneous contrast and no thrombus in the LA and LA appendage   The descending thoracic aorta had mild diffuse aortic atherosclerosis no evidence of aneurysmal dilation or disection  Concern for OSA during the case.   Ida Rogue 04/21/2019 3:08 PM   jk

## 2019-04-21 NOTE — Plan of Care (Signed)
Patient has been very lethargic. He says that he gets short of breath sometimes when he sleeps. I put patient on 2L nasal cannula for comfort.  Patient is usually 94% on room air.  He also states that he did a sleep study and did not qualify for cpap.  Will continue to monitor.  2:53 AM  Christene Slates

## 2019-04-21 NOTE — Progress Notes (Signed)
Diagnosis: Enterococcus bacteremia with stag horn calculus and complicated UTI Baseline Creatinine < 1   Allergies  Allergen Reactions  . Metformin     Loose stools even with XR    OPAT Orders Discharge antibiotics: Ampicillin 2 grams IV every 6 hours  For 1 week End Date: 04/29/19  PIC/Mid line Care Per Protocol:  Labs weekly while on IV antibiotics: _X_ CBC with differential  _X_ CMP _X_ Please pull PIC at completion of IV antibiotics _XFax weekly labs to [phone]  Call [phone] with any questions

## 2019-04-21 NOTE — Progress Notes (Signed)
*  PRELIMINARY RESULTS* Echocardiogram Echocardiogram Transesophageal has been performed.  Sherrie Sport 04/21/2019, 3:04 PM

## 2019-04-22 ENCOUNTER — Encounter: Payer: Self-pay | Admitting: Cardiovascular Disease

## 2019-04-22 LAB — CBC
HCT: 33 % — ABNORMAL LOW (ref 39.0–52.0)
Hemoglobin: 10.9 g/dL — ABNORMAL LOW (ref 13.0–17.0)
MCH: 34 pg (ref 26.0–34.0)
MCHC: 33 g/dL (ref 30.0–36.0)
MCV: 102.8 fL — ABNORMAL HIGH (ref 80.0–100.0)
Platelets: 190 10*3/uL (ref 150–400)
RBC: 3.21 MIL/uL — ABNORMAL LOW (ref 4.22–5.81)
RDW: 14.1 % (ref 11.5–15.5)
WBC: 10.8 10*3/uL — ABNORMAL HIGH (ref 4.0–10.5)
nRBC: 0 % (ref 0.0–0.2)

## 2019-04-22 LAB — BASIC METABOLIC PANEL
Anion gap: 5 (ref 5–15)
BUN: 12 mg/dL (ref 8–23)
CO2: 26 mmol/L (ref 22–32)
Calcium: 9 mg/dL (ref 8.9–10.3)
Chloride: 105 mmol/L (ref 98–111)
Creatinine, Ser: 0.92 mg/dL (ref 0.61–1.24)
GFR calc Af Amer: >60 mL/min (ref >60)
GFR calc non Af Amer: >60 mL/min (ref >60)
Glucose, Bld: 162 mg/dL — ABNORMAL HIGH (ref 70–99)
Potassium: 3.6 mmol/L (ref 3.5–5.1)
Sodium: 136 mmol/L (ref 135–145)

## 2019-04-22 LAB — GLUCOSE, CAPILLARY
Glucose-Capillary: 150 mg/dL — ABNORMAL HIGH (ref 70–99)
Glucose-Capillary: 210 mg/dL — ABNORMAL HIGH (ref 70–99)
Glucose-Capillary: 179 mg/dL — ABNORMAL HIGH (ref 70–99)
Glucose-Capillary: 183 mg/dL — ABNORMAL HIGH (ref 70–99)

## 2019-04-22 MED ORDER — SODIUM CHLORIDE 0.9% FLUSH
10.0000 mL | Freq: Two times a day (BID) | INTRAVENOUS | Status: DC
  Administered 2019-04-22 – 2019-04-23 (×3): 10 mL

## 2019-04-22 MED ORDER — SODIUM CHLORIDE 0.9% FLUSH: 10.0000 mL | INTRAVENOUS | Status: DC | PRN

## 2019-04-22 NOTE — Progress Notes (Signed)
Went to place midline, at this time the patient is eating and request return in about 30-40 mins. RN Jodie made aware

## 2019-04-22 NOTE — Progress Notes (Signed)
[place] [facility] [place] NAME: Thelton Moffitt    MR#:  [id number]  DATE OF BIRTH:  07-01-1946  SUBJECTIVE:  CHIEF COMPLAINT:   Chief Complaint  Patient presents with  . Abnormal Labs  . Diarrhea  Patient seen and evaluated today No fever Decreased abdominal pain and flank pain No vomiting Awake and responds to all verbal commands On oxygen via nasal canula at 2liter which is new requirement  REVIEW OF SYSTEMS:    ROS  CONSTITUTIONAL: No documented fever. Has fatigue, weakness. No weight gain, no weight loss.  EYES: No blurry or double vision.  ENT: No tinnitus. No postnasal drip. No redness of the oropharynx.  RESPIRATORY: No cough, no wheeze, no hemoptysis. No dyspnea.  CARDIOVASCULAR: No chest pain. No orthopnea. No palpitations. No syncope.  GASTROINTESTINAL: No nausea, no vomiting or diarrhea. No abdominal pain. No melena or hematochezia.  GENITOURINARY: No dysuria or hematuria.  ENDOCRINE: No polyuria or nocturia. No heat or cold intolerance.  HEMATOLOGY: No anemia. No bruising. No bleeding.  INTEGUMENTARY: No rashes. No lesions.  MUSCULOSKELETAL: No arthritis. No swelling. No gout.  NEUROLOGIC: No numbness, tingling, or ataxia. No seizure-type activity.  PSYCHIATRIC: No anxiety. No insomnia. No ADD.   DRUG ALLERGIES:   Allergies  Allergen Reactions  . Metformin     Loose stools even with XR    VITALS:  Blood pressure 137/74, pulse 88, temperature 98.6 F (37 C), temperature source Axillary, resp. rate (!) 22, height 5\' 9"  (1.753 m), weight 108.3 kg, SpO2 93 %.  PHYSICAL EXAMINATION:   Physical Exam  GENERAL:  73 y.o.-year-old patient lying in the bed with no acute distress.  EYES: Pupils equal, round, reactive to light and accommodation. No scleral icterus. Extraocular muscles intact.  HEENT: Head atraumatic, normocephalic. Oropharynx and nasopharynx clear.  NECK:  Supple, no jugular venous distention. No thyroid  enlargement, no tenderness.  LUNGS: Normal breath sounds bilaterally, no wheezing, rales, rhonchi. No use of accessory muscles of respiration.  CARDIOVASCULAR: S1, S2 normal. No murmurs, rubs, or gallops.  ABDOMEN: Soft, nontender, nondistended. Bowel sounds present. No organomegaly or mass.  EXTREMITIES: No cyanosis, clubbing or edema b/l.    NEUROLOGIC: Cranial nerves II through XII are intact. No focal Motor or sensory deficits b/l.   PSYCHIATRIC: The patient is alert and oriented x 3.  SKIN: No obvious rash, lesion, or ulcer.   LABORATORY PANEL:   CBC Recent Labs  Lab 04/22/19 0347  WBC 10.8*  HGB 10.9*  HCT 33.0*  PLT 190   ------------------------------------------------------------------------------------------------------------------ Chemistries  Recent Labs  Lab 04/18/19 1008  04/22/19 0347  NA 130*   < > 136  K 3.4*   < > 3.6  CL 97*   < > 105  CO2 22   < > 26  GLUCOSE 183*   < > 162*  BUN 14   < > 12  CREATININE 0.83   < > 0.92  CALCIUM 8.2*   < > 9.0  AST 50*  --   --   ALT 39  --   --   ALKPHOS 45  --   --   BILITOT 0.9  --   --    < > = values in this interval not displayed.   ------------------------------------------------------------------------------------------------------------------  Cardiac Enzymes No results for input(s): TROPONINI in the last 168 hours. ------------------------------------------------------------------------------------------------------------------  RADIOLOGY:  No results found.   ASSESSMENT AND PLAN:  73 year old male patient with history of type 2  diabetes mellitus, hypertension, aortic valve replacement, CABG currently under hospitalist service.  He was recently discharged home on oral antibiotics for UTI and pneumonia but blood cultures came back positive with enterococcus.  -Enterococcal bacteremia Continue ampicillin antibiotic ( total duration 14 days) Midline placement today Infectious disease follow-up  appreciated COVID-19 test negative Trans thoracic echo shows no vegetations, EF 60 to 65% TEE no vegetations  -Sepsis secondary to bacteremia improved IV ABX on board  -Hypoxia On oxygen via [facility] oxygen  -Staghorn calculus with mild hydronephrosis Urology consult appreciated Out patient f/u, no intervention recommended currently  -Diarrhea resolved  -DVT prophylaxis subcu Lovenox daily   All the records are reviewed and case discussed with Care Management/Social Worker. Management plans discussed with the patient, family and they are in agreement.  CODE STATUS: Full code  DVT Prophylaxis: SCDs  TOTAL TIME TAKING CARE OF THIS PATIENT: 37 minutes.   POSSIBLE D/C IN 2 to 3 DAYS, DEPENDING ON CLINICAL CONDITION.  Saundra Shelling M.D on 04/22/2019 at 11:40 AM  Between 7am to 6pm - Pager - [phone]  After 6pm go to [email or web address] - password [email or web address] [facility]  Office  [phone]  CC: Primary care physician; Patient, No Pcp Per  Note: This dictation was prepared with Dragon dictation along with smaller phrase technology. Any transcriptional errors that result from this process are unintentional.

## 2019-04-22 NOTE — Progress Notes (Signed)
Patient got up with nurse tech and walked. He done well but got very fatigued with the walk. O2 sats were 94-98% on room air right after the walk.

## 2019-04-22 NOTE — TOC Initial Note (Signed)
Transition of Care (TOC) - Initial/Assessment Note    Patient Details  Name: Jonathan Cross. MRN: [id number] Date of Birth: [birth year]  Transition of Care [facility]) CM/SW Contact:    Annamaria Boots, Latanya Presser Phone Number: 04/22/2019, 3:33 PM  Clinical Narrative:    Patient lives alone. He will need home IV antibiotics and home health at discharge. CSW set up home health through [place] [facility] for RN, PT. CSW also set up home IV antibiotics with [facility]. TOC team will follow for discharge planning.                Expected Discharge Plan: [place] Barriers to Discharge: Continued Medical Work up   Patient Goals and CMS Choice   CMS Medicare.gov Compare Post Acute Care list provided to:: Patient Choice offered to / list presented to : Patient  Expected Discharge Plan and Services Expected Discharge Plan: [place] Choice: [place] arrangements for the past 2 months: Single Family Home                           HH Arranged: RN, PT, IV Antibiotics   Date HH Agency Contacted: 04/22/19 Time [place]: 1532 Representative spoke with at [place]: [place] Arrangements/Services Living arrangements for the past 2 months: [place] with:: Self Patient language and need for interpreter reviewed:: Yes Do you feel safe going back to the place where you live?: Yes      Need for Family Participation in Patient Care: Yes (Comment) Care giver support system in place?: Yes (comment)   Criminal Activity/Legal Involvement Pertinent to Current Situation/Hospitalization: No - Comment as needed  Activities of Daily Living Home Assistive Devices/Equipment: None ADL Screening (condition at time of admission) Patient's cognitive ability adequate to safely complete daily activities?: Yes Is the patient deaf or have difficulty hearing?: Yes Does the patient have difficulty  seeing, even when wearing glasses/contacts?: No Does the patient have difficulty concentrating, remembering, or making decisions?: No Patient able to express need for assistance with ADLs?: No Does the patient have difficulty dressing or bathing?: No Independently performs ADLs?: Yes (appropriate for developmental age) Does the patient have difficulty walking or climbing stairs?: Yes Weakness of Legs: Both Weakness of Arms/Hands: None  Permission Sought/Granted Permission sought to share information with : Case Manager, Customer service manager, Family Supports Permission granted to share information with : Yes, Verbal Permission Granted              Emotional Assessment Appearance:: Appears stated age   Affect (typically observed): Accepting, Pleasant Orientation: : Oriented to Self, Oriented to Place, Oriented to  Time Alcohol / Substance Use: Not Applicable Psych Involvement: No (comment)  Admission diagnosis:  Bacteremia [R78.81] Staghorn calculus [N20.0] Acute cystitis without hematuria [N30.00] Patient Active Problem List   Diagnosis Date Noted  . Bacteremia 04/18/2019   PCP:  Patient, No Pcp Per Pharmacy:   [facility] [address]), [place] [facility] [place]) [place] [address] Phone: [phone] Fax: [phone]     Social Determinants of Health (SDOH) Interventions    Readmission Risk Interventions No flowsheet data found.

## 2019-04-23 LAB — GLUCOSE, CAPILLARY
Glucose-Capillary: 149 mg/dL — ABNORMAL HIGH (ref 70–99)
Glucose-Capillary: 208 mg/dL — ABNORMAL HIGH (ref 70–99)

## 2019-04-23 MED ORDER — INSULIN ASPART 100 UNIT/ML ~~LOC~~ SOLN: 0.0000 [IU] | Freq: Three times a day (TID) | SUBCUTANEOUS | 11 refills | Status: DC

## 2019-04-23 MED ORDER — OXYCODONE-ACETAMINOPHEN 7.5-325 MG PO TABS: 1.0000 | ORAL_TABLET | ORAL | 0 refills | Status: DC | PRN

## 2019-04-23 MED ORDER — INSULIN ASPART 100 UNIT/ML ~~LOC~~ SOLN: 0.0000 [IU] | Freq: Every day | SUBCUTANEOUS | 11 refills | Status: DC

## 2019-04-23 MED ORDER — INSULIN ASPART PROT & ASPART (70-30 MIX) 100 UNIT/ML ~~LOC~~ SUSP: 30.0000 [IU] | Freq: Two times a day (BID) | SUBCUTANEOUS | 11 refills | Status: DC

## 2019-04-23 MED ORDER — AMPICILLIN IV (FOR PTA / DISCHARGE USE ONLY): 2.0000 g | Freq: Four times a day (QID) | INTRAVENOUS | 0 refills | Status: AC

## 2019-04-23 NOTE — TOC Transition Note (Signed)
Transition of Care [facility]) - CM/SW Discharge Note   Patient Details  Name: Jonathan Cross. MRN: [id number] Date of Birth: [birth year]  Transition [facility]) CM/SW Contact:  Marshell Garfinkel, RN Phone Number: 04/23/2019, 10:20 AM   Clinical Narrative:    Spoke with patient and he is ready for discharge. He feels he and his daughter can manage PICC and IV infusion in the home. Hospital to administer dose today and [facility] RN will start care tomorrow. Pam with [facility] aware of patient discharge and ready. Patient states his daughter will provide transportation to home today. He denies ambulatory assistance needs. Patient states his PCP is with [facility] and last seen on Thursday. RNCM spoke with hospital RN updated of infusion need prior to discharge.    Final next level of care: Home w Home Health Services Barriers to Discharge: No Barriers Identified(IV dose to admin prior to discharge)   Patient Goals and CMS Choice   CMS Medicare.gov Compare Post Acute Care list provided to:: Patient Choice offered to / list presented to : Patient  Discharge Placement                       Discharge Plan and Services     Post Acute Care Choice: Home Health          DME Arranged: IV pump/equipment([facility]) DME Agency: Other - Comment([facility]) Date DME Agency Contacted: 04/23/19 Time DME Agency Contacted: [phone] Representative spoke with at DME Agency: [place]: RN [place] Agency: [place] 04/24/19) Date [place]: 04/23/19 Time [place]: 81 Representative spoke with at [place]: [place] ([place]) Interventions     Readmission Risk Interventions No flowsheet data found.

## 2019-04-23 NOTE — Discharge Summary (Signed)
Jonathan Cross., is a 73 y.o. male  DOB 08-10-1945  MRN [id number].  Admission date:  04/18/2019  Admitting Physician  Hillary Bow, MD  Discharge Date:  04/23/2019   Primary MD  Patient, No Pcp Per  Recommendations for primary care physician for things to follow:    Follow-up with Dr. Bernardo Heater in 1 to 2 weeks Follow-up with Dr. Evalee Mutton in 1 week.  Admission Diagnosis  Bacteremia [R78.81] Staghorn calculus [N20.0] Acute cystitis without hematuria [N30.00]   Discharge Diagnosis  Bacteremia [R78.81] Staghorn calculus [N20.0] Acute cystitis without hematuria [N30.00]   Active Problems:   Bacteremia      Past Medical History:  Diagnosis Date  . Coronary artery disease   . Diabetes ([place])   . H/O aortic valve replacement   . Hx of CABG   . Hypertension     Past Surgical History:  Procedure Laterality Date  . TEE WITHOUT CARDIOVERSION N/A 04/21/2019   Procedure: TRANSESOPHAGEAL ECHOCARDIOGRAM (TEE);  Surgeon: Minna Merritts, MD;  Location: [facility] ORS;  Service: Cardiovascular;  Laterality: N/A;       History of present illness and  Hospital Course:     Kindly see H&P for history of present illness and admission details, please review complete Labs, Consult reports and Test reports for all details in brief  HPI  from the history and physical done on the day of admission 73 year old male patient with history of diabetes mellitus type 2, hypertension, aortic valve replacement, CABG came to hospital because of positive blood cultures.  Patient was seen the day before in the ER for UTI discharged home with antibiotics but was called back for positive blood cultures.   Hospital Course  Enterococcal bacteremia: Source is UTI.  Patient has staghorn calculus with mild hydronephrosis on the CT of abdomen.   Initially admitted to hospitalist service and started on IV vancomycin, seen by Dr. Bernardo Heater from urology regarding staghorn calculus.  For enterococcal bacteremia patient is seen by Dr. Evalee Mutton from ID, recommended IV antibiotics with ampicillin 2 g every 6 hours till October 16.  Patient does have a midline.  Had a TEE which she did not show any vegetations.  Transthoracic echo showed EF more than 60 to 65%.  COVID-19 test is negative.  Patient lives alone, needs home health, for IV antibiotics through [facility], home health nurse and physical therapy through [facility]/ 2.  Sepsis present on admission due to bacteremia, resolved now. 3.  Staghorn calculus with mild hydronephrosis, seen by urology, patient can follow with urology as an outpatient regarding stone management.  Can see Dr. Bernardo Heater as an outpatient.  Told the patient about that. 4.  Diabetes mellitus type 2: Patient's home dose insulin has been adjusted, added mealtime coverage, nighttime coverage as well, patient will have a home health nurse regarding diabetes management as well.  Staghorn calculus with mild hydronephrosis Seen by urology Out patient f/u, no intervention recommended currently    Discharge Condition: Stable   Follow UP  Follow-up Information    Tsosie Billing, MD. Schedule an appointment as soon as possible for a visit in 1 week(s).   Specialty: Infectious Diseases Why: Please call the office on Monday to schedule a followup visit in one week.  [phone] Contact information: [place] [address] [phone]        Abbie Sons, MD. Schedule an appointment as soon as possible for a visit in 1 week(s).   Specialty: Urology  Why: Please call the office on Monday to schedule a followup visit in one week.  [phone] Contact information: [place] [address] [phone]             Discharge Instructions  and   Discharge Medications  Please finish IV antibiotics for today, patient to have home health from tomorrow. Discharge antibiotics: Ampicillin 2 grams IV every 6 hours  For 1 week End Date: 04/29/19  PIC/Mid line Care Per Protocol:  Labs weekly while on IV antibiotics: _X_ CBC with differential  _X_ CMP _X_ Please pull PIC at completion of IV antibiotics _XFax weekly labs to [phone]  Call [phone] with any questions            Discharge Instructions    Home infusion instructions [facility] May follow [place] Dosing Protocol; May administer Cathflo as needed to maintain patency of vascular access device.; Flushing of vascular access device: per [facility] Protocol: 0.9% NaCl pre/post medica...   Complete by: As directed    Instructions: May follow [place] Dosing Protocol   Instructions: May administer Cathflo as needed to maintain patency of vascular access device.   Instructions: Flushing of vascular access device: per [facility] Protocol: 0.9% NaCl pre/post medication administration and prn patency; Heparin 100 u/ml, 44ml for implanted ports and Heparin 10u/ml, 70ml for all other central venous catheters.   Instructions: May follow [facility] Anaphylaxis Protocol for First Dose Administration in the home: 0.9% NaCl at 25-50 ml/hr to maintain IV access for protocol meds. Epinephrine 0.3 ml IV/IM PRN and Benadryl 25-50 IV/IM PRN s/s of anaphylaxis.   Instructions: [facility] Infusion Coordinator (RN) to assist per patient IV care needs in the home PRN.     Allergies as of 04/23/2019      Reactions   Metformin    Loose stools even with XR      Medication List    STOP taking these medications   azithromycin 250 MG tablet Commonly known as: Zithromax Z-Pak   NovoLIN 70/30 ReliOn (70-30) 100 UNIT/ML injection Generic drug: insulin NPH-regular Human Replaced by: insulin aspart protamine- aspart (70-30) 100 UNIT/ML injection     TAKE these medications    amLODipine 5 MG tablet Commonly known as: NORVASC Take 5 mg by mouth daily.   ampicillin  IVPB Inject 2 g into the vein every 6 (six) hours for 7 days. Indication: Enterococcal bacteremia with stag horn calculus and uncomplicated UTI Last Day of Therapy:  04/29/19 Labs - Once weekly:  CBC/D and CMP, please fax to [phone] Please pull PIC at completion of IV antibiotics Call with any questions: [phone]   aspirin EC 81 MG tablet Take 81 mg by mouth daily.   atorvastatin 80 MG tablet Commonly known as: LIPITOR Take 80 mg by mouth daily.   insulin aspart 100 UNIT/ML injection Commonly known as: novoLOG Inject 0-15 Units into the skin 3 (three) times daily with meals.   insulin aspart 100 UNIT/ML injection Commonly known as: novoLOG Inject 0-5 Units into the skin at bedtime.   insulin aspart protamine- aspart (70-30) 100 UNIT/ML injection Commonly known as: NOVOLOG MIX 70/30 Inject 0.3 mLs (30 Units total) into the skin 2 (two) times daily with a meal. Replaces: NovoLIN 70/30 ReliOn (70-30) 100 UNIT/ML injection   isosorbide mononitrate 30 MG 24 hr tablet Commonly known as: IMDUR Take 30 mg by mouth daily.   lisinopril 40 MG tablet Commonly known as: ZESTRIL Take 40 mg  by mouth daily.   loratadine 10 MG tablet Commonly known as: CLARITIN Take 10 mg by mouth daily.   metoprolol succinate 25 MG 24 hr tablet Commonly known as: TOPROL-XL Take 25 mg by mouth daily.   nitroGLYCERIN 0.4 MG SL tablet Commonly known as: NITROSTAT Place 0.4 mg under the tongue as needed.   omeprazole 20 MG capsule Commonly known as: PRILOSEC Take 20 mg by mouth daily.            Home Infusion Instuctions  (From admission, onward)         Start     Ordered   04/23/19 0000  Home infusion instructions [facility] May follow [place] Dosing Protocol; May administer Cathflo as needed to maintain patency of vascular access device.; Flushing of vascular access device:  per [facility] Protocol: 0.9% NaCl pre/post medica...    Question Answer Comment  Instructions May follow [place] Dosing Protocol   Instructions May administer Cathflo as needed to maintain patency of vascular access device.   Instructions Flushing of vascular access device: per [facility] Protocol: 0.9% NaCl pre/post medication administration and prn patency; Heparin 100 u/ml, 30ml for implanted ports and Heparin 10u/ml, 82ml for all other central venous catheters.   Instructions May follow [facility] Anaphylaxis Protocol for First Dose Administration in the home: 0.9% NaCl at 25-50 ml/hr to maintain IV access for protocol meds. Epinephrine 0.3 ml IV/IM PRN and Benadryl 25-50 IV/IM PRN s/s of anaphylaxis.   Instructions [facility] Infusion Coordinator (RN) to assist per patient IV care needs in the home PRN.      04/23/19 0946            Diet and Activity recommendation: See Discharge Instructions above   Consults obtained -ID consult, urology   Major procedures and Radiology Reports - PLEASE review detailed and final reports for all details, in brief -     Ct Chest W Contrast  Result Date: 04/17/2019 CLINICAL DATA:  73 year old male with history of acute respiratory illness. Shortness of breath. Generalized weakness and fever. EXAM: CT CHEST, ABDOMEN, AND PELVIS WITH CONTRAST TECHNIQUE: Multidetector CT imaging of the chest, abdomen and pelvis was performed following the standard protocol during bolus administration of intravenous contrast. CONTRAST:  135mL OMNIPAQUE IOHEXOL 300 MG/ML  SOLN COMPARISON:  None. FINDINGS: CT CHEST FINDINGS Cardiovascular: Heart size is normal. There is no significant pericardial fluid, thickening or pericardial calcification. There is aortic atherosclerosis, as well as atherosclerosis of the great vessels of the mediastinum and the coronary arteries, including calcified atherosclerotic plaque in the left main, left anterior descending, left circumflex and right  coronary arteries. Status post median sternotomy for CABG including LIMA to the LAD, as well as aortic root replacement with a stented bioprosthesis. Mediastinum/Nodes: No pathologically enlarged mediastinal or hilar lymph nodes. Esophagus is unremarkable in appearance. No axillary lymphadenopathy. Lungs/Pleura: A few scattered tiny pulmonary nodules are noted in the lungs bilaterally, largest of which measures only 4 mm in the right lower lobe (axial image 97 of series 4), nonspecific. No larger more suspicious appearing pulmonary nodules or masses are noted. No acute consolidative airspace disease. No pleural effusions. Musculoskeletal: Median sternotomy wires. There are no aggressive appearing lytic or blastic lesions noted in the visualized portions of the skeleton. CT ABDOMEN PELVIS FINDINGS Hepatobiliary: 1.2 cm low-attenuation lesion in segment 4A of the liver, compatible with a simple cyst. Subcentimeter low-attenuation lesion in segment 2 of the liver, incompletely characterized on today's non-contrast CT examination, but  statistically likely to represent a cyst. No other larger more suspicious appearing hepatic lesions. No intra or extrahepatic biliary ductal dilatation. Gallbladder is normal in appearance. Pancreas: No pancreatic mass. No pancreatic ductal dilatation. No pancreatic or peripancreatic fluid collections or inflammatory changes. Spleen: Unremarkable. Adrenals/Urinary Tract: Multiple right-sided renal calculi, largest of which is a staghorn calculus which extends into the renal pelvis measuring 4.0 x 2.1 x 3.9 cm (axial image 72 of series 2 and sagittal image 66 of series 6). This is associated with mild right hydronephrosis. No additional calculi are noted in the collecting system of left kidney or along the course of either ureter. No hydroureter. 5.9 cm low-attenuation lesion in the interpolar region of the left kidney, compatible with a large peripelvic cyst. Urinary bladder is normal in  appearance. Bilateral adrenal glands are normal in appearance. Stomach/Bowel: Normal appearance of the stomach. No pathologic dilatation of small bowel or colon. The appendix is not confidently identified and may be surgically absent. Regardless, there are no inflammatory changes noted adjacent to the cecum to suggest the presence of an acute appendicitis at this time. Vascular/Lymphatic: Aortic atherosclerosis, without evidence of aneurysm or dissection in the abdominal or pelvic vasculature. No lymphadenopathy noted in the abdomen or pelvis. Reproductive: Prostate gland and seminal vesicles are unremarkable in appearance. Other: No significant volume of ascites.  No pneumoperitoneum. Musculoskeletal: There are no aggressive appearing lytic or blastic lesions noted in the visualized portions of the skeleton. IMPRESSION: 1. No acute findings are noted in the thorax to account for the patient's symptoms. 2. Large calculi in the right renal collecting system, largest of which is in the staghorn calculus which extends into the renal pelvis measuring 4.0 x 2.1 x 3.9 cm which is associated with mild right proximal hydronephrosis. 3. Small pulmonary nodules in the lungs bilaterally measuring 4 mm or less in size, nonspecific, but statistically likely benign. No follow-up needed if patient is low-risk (and has no known or suspected primary neoplasm). Non-contrast chest CT can be considered in 12 months if patient is high-risk. This recommendation follows the consensus statement: Guidelines for Management of Incidental Pulmonary Nodules Detected on CT Images: From the Fleischner Society 2017; Radiology 2017; 284:228-243. 4. Aortic atherosclerosis, in addition to left main and 3 vessel coronary artery disease. Status post median sternotomy for CABG including LIMA to the LAD. 5. Additional incidental findings, as above. Electronically Signed   By: Vinnie Langton M.D.   On: 04/17/2019 20:21   Ct Abdomen Pelvis W  Contrast  Result Date: 04/17/2019 CLINICAL DATA:  73 year old male with history of acute respiratory illness. Shortness of breath. Generalized weakness and fever. EXAM: CT CHEST, ABDOMEN, AND PELVIS WITH CONTRAST TECHNIQUE: Multidetector CT imaging of the chest, abdomen and pelvis was performed following the standard protocol during bolus administration of intravenous contrast. CONTRAST:  141mL OMNIPAQUE IOHEXOL 300 MG/ML  SOLN COMPARISON:  None. FINDINGS: CT CHEST FINDINGS Cardiovascular: Heart size is normal. There is no significant pericardial fluid, thickening or pericardial calcification. There is aortic atherosclerosis, as well as atherosclerosis of the great vessels of the mediastinum and the coronary arteries, including calcified atherosclerotic plaque in the left main, left anterior descending, left circumflex and right coronary arteries. Status post median sternotomy for CABG including LIMA to the LAD, as well as aortic root replacement with a stented bioprosthesis. Mediastinum/Nodes: No pathologically enlarged mediastinal or hilar lymph nodes. Esophagus is unremarkable in appearance. No axillary lymphadenopathy. Lungs/Pleura: A few scattered tiny pulmonary nodules are noted in the lungs  bilaterally, largest of which measures only 4 mm in the right lower lobe (axial image 97 of series 4), nonspecific. No larger more suspicious appearing pulmonary nodules or masses are noted. No acute consolidative airspace disease. No pleural effusions. Musculoskeletal: Median sternotomy wires. There are no aggressive appearing lytic or blastic lesions noted in the visualized portions of the skeleton. CT ABDOMEN PELVIS FINDINGS Hepatobiliary: 1.2 cm low-attenuation lesion in segment 4A of the liver, compatible with a simple cyst. Subcentimeter low-attenuation lesion in segment 2 of the liver, incompletely characterized on today's non-contrast CT examination, but statistically likely to represent a cyst. No other larger  more suspicious appearing hepatic lesions. No intra or extrahepatic biliary ductal dilatation. Gallbladder is normal in appearance. Pancreas: No pancreatic mass. No pancreatic ductal dilatation. No pancreatic or peripancreatic fluid collections or inflammatory changes. Spleen: Unremarkable. Adrenals/Urinary Tract: Multiple right-sided renal calculi, largest of which is a staghorn calculus which extends into the renal pelvis measuring 4.0 x 2.1 x 3.9 cm (axial image 72 of series 2 and sagittal image 66 of series 6). This is associated with mild right hydronephrosis. No additional calculi are noted in the collecting system of left kidney or along the course of either ureter. No hydroureter. 5.9 cm low-attenuation lesion in the interpolar region of the left kidney, compatible with a large peripelvic cyst. Urinary bladder is normal in appearance. Bilateral adrenal glands are normal in appearance. Stomach/Bowel: Normal appearance of the stomach. No pathologic dilatation of small bowel or colon. The appendix is not confidently identified and may be surgically absent. Regardless, there are no inflammatory changes noted adjacent to the cecum to suggest the presence of an acute appendicitis at this time. Vascular/Lymphatic: Aortic atherosclerosis, without evidence of aneurysm or dissection in the abdominal or pelvic vasculature. No lymphadenopathy noted in the abdomen or pelvis. Reproductive: Prostate gland and seminal vesicles are unremarkable in appearance. Other: No significant volume of ascites.  No pneumoperitoneum. Musculoskeletal: There are no aggressive appearing lytic or blastic lesions noted in the visualized portions of the skeleton. IMPRESSION: 1. No acute findings are noted in the thorax to account for the patient's symptoms. 2. Large calculi in the right renal collecting system, largest of which is in the staghorn calculus which extends into the renal pelvis measuring 4.0 x 2.1 x 3.9 cm which is associated with  mild right proximal hydronephrosis. 3. Small pulmonary nodules in the lungs bilaterally measuring 4 mm or less in size, nonspecific, but statistically likely benign. No follow-up needed if patient is low-risk (and has no known or suspected primary neoplasm). Non-contrast chest CT can be considered in 12 months if patient is high-risk. This recommendation follows the consensus statement: Guidelines for Management of Incidental Pulmonary Nodules Detected on CT Images: From the Fleischner Society 2017; Radiology 2017; 284:228-243. 4. Aortic atherosclerosis, in addition to left main and 3 vessel coronary artery disease. Status post median sternotomy for CABG including LIMA to the LAD. 5. Additional incidental findings, as above. Electronically Signed   By: Vinnie Langton M.D.   On: 04/17/2019 20:21   Dg Chest Portable 1 View  Result Date: 04/18/2019 CLINICAL DATA:  Pt was called back to the ED with positive blood cultures, pt has continued diarrhea with generalized weakness and fever since seen here 10/4.hx of hypertension, CAD. Non smoker EXAM: PORTABLE CHEST 1 VIEW COMPARISON:  Chest radiograph 04/17/2019 FINDINGS: Stable cardiomediastinal contours status post median sternotomy. The lungs are clear. No pneumothorax or pleural effusion. No acute finding in the visualized skeleton. IMPRESSION: No  evidence of active disease. Electronically Signed   By: Audie Pinto M.D.   On: 04/18/2019 11:58   Dg Chest Port 1 View  Result Date: 04/17/2019 CLINICAL DATA:  Fever, cough EXAM: PORTABLE CHEST 1 VIEW COMPARISON:  None. FINDINGS: Cardiomegaly status post median sternotomy and CABG. Both lungs are clear. The visualized skeletal structures are unremarkable. IMPRESSION: Cardiomegaly without acute abnormality of the lungs in AP portable projection. Electronically Signed   By: Eddie Candle M.D.   On: 04/17/2019 16:20    Micro Results    Recent Results (from the past 240 hour(s))  Blood Culture (routine x 2)      Status: Abnormal   Collection Time: 04/17/19  3:27 PM   Specimen: BLOOD  Result Value Ref Range Status   Specimen Description   Final    BLOOD RIGHT ANTECUBITAL Performed at [facility], [address] [place], [place] [address]    Special Requests   Final    BOTTLES DRAWN AEROBIC AND ANAEROBIC Blood Culture adequate volume Performed at [facility], [address] [place], [place] [address]    Culture  Setup Time   Final    GRAM POSITIVE COCCI IN BOTH AEROBIC AND ANAEROBIC BOTTLES CRITICAL RESULT CALLED TO, READ BACK BY AND VERIFIED WITH: GREG MOYER AT [facility] [place] ON 04/18/2019 [place]. Performed at [facility], [place] [address] [place], [place] [address]    Culture ENTEROCOCCUS FAECALIS (A)  Final   Report Status 04/20/2019 FINAL  Final   Organism ID, Bacteria ENTEROCOCCUS FAECALIS  Final      Susceptibility   Enterococcus faecalis - MIC*    AMPICILLIN <=2 SENSITIVE Sensitive     VANCOMYCIN 1 SENSITIVE Sensitive     GENTAMICIN SYNERGY SENSITIVE Sensitive     * ENTEROCOCCUS FAECALIS  Blood Culture ID Panel (Reflexed)     Status: Abnormal   Collection Time: 04/17/19  3:27 PM  Result Value Ref Range Status   Enterococcus species DETECTED (A) NOT DETECTED Final    Comment: CRITICAL RESULT CALLED TO, READ BACK BY AND VERIFIED WITH: GREG MOYER AT [id number] ON 04/18/2019 [place].    Vancomycin resistance NOT DETECTED NOT DETECTED Final   Listeria monocytogenes NOT DETECTED NOT DETECTED Final   Staphylococcus species NOT DETECTED NOT DETECTED Final   Staphylococcus aureus (BCID) NOT DETECTED NOT DETECTED Final   Streptococcus species NOT DETECTED NOT DETECTED Final   Streptococcus agalactiae NOT DETECTED NOT DETECTED Final   Streptococcus pneumoniae NOT DETECTED NOT DETECTED Final   Streptococcus pyogenes NOT DETECTED NOT DETECTED Final   Acinetobacter baumannii NOT DETECTED NOT DETECTED Final   Enterobacteriaceae species NOT DETECTED NOT DETECTED Final   Enterobacter  cloacae complex NOT DETECTED NOT DETECTED Final   Escherichia coli NOT DETECTED NOT DETECTED Final   Klebsiella oxytoca NOT DETECTED NOT DETECTED Final   Klebsiella pneumoniae NOT DETECTED NOT DETECTED Final   Proteus species NOT DETECTED NOT DETECTED Final   Serratia marcescens NOT DETECTED NOT DETECTED Final   Haemophilus influenzae NOT DETECTED NOT DETECTED Final   Neisseria meningitidis NOT DETECTED NOT DETECTED Final   Pseudomonas aeruginosa NOT DETECTED NOT DETECTED Final   Candida albicans NOT DETECTED NOT DETECTED Final   Candida glabrata NOT DETECTED NOT DETECTED Final   Candida krusei NOT DETECTED NOT DETECTED Final   Candida parapsilosis NOT DETECTED NOT DETECTED Final   Candida tropicalis NOT DETECTED NOT DETECTED Final    Comment: Performed at [facility], [place], [place]  [address]  Blood Culture (routine x 2)     Status: Abnormal   Collection Time: 04/17/19  3:32 PM   Specimen: BLOOD  Result Value Ref Range Status   Specimen Description   Final    BLOOD LEFT ANTECUBITAL Performed at [facility], [address] [place], [place] [address]    Special Requests   Final    BOTTLES DRAWN AEROBIC AND ANAEROBIC Blood Culture results may not be optimal due to an excessive volume of blood received in culture bottles Performed at [facility], [address] [place], [place] [address]    Culture  Setup Time   Final    GRAM POSITIVE COCCI IN BOTH AEROBIC AND ANAEROBIC BOTTLES CRITICAL RESULT CALLED TO, READ BACK BY AND VERIFIED WITH: GREG MOYER AT [id number] ON 04/18/2019 [place]. Performed at [facility], [address], [place], [place] [address]    Culture (A)  Final    ENTEROCOCCUS FAECALIS SUSCEPTIBILITIES PERFORMED ON PREVIOUS CULTURE WITHIN THE LAST 5 DAYS. Performed at [facility], [place] [address] [place], [place] [address]    Report Status 04/20/2019 FINAL  Final  SARS Coronavirus 2 [facility] order, Performed  in [facility] hospital lab) Nasopharyngeal Nasopharyngeal Swab     Status: None   Collection Time: 04/17/19  3:46 PM   Specimen: Nasopharyngeal Swab  Result Value Ref Range Status   SARS Coronavirus 2 NEGATIVE NEGATIVE Final    Comment: (NOTE) If result is NEGATIVE SARS-CoV-2 target nucleic acids are NOT DETECTED. The SARS-CoV-2 RNA is generally detectable in upper and lower  respiratory specimens during the acute phase of infection. The lowest  concentration of SARS-CoV-2 viral copies this assay can detect is 250  copies / mL. A negative result does not preclude SARS-CoV-2 infection  and should not be used as the sole basis for treatment or other  patient management decisions.  A negative result may occur with  improper specimen collection / handling, submission of specimen other  than nasopharyngeal swab, presence of viral mutation(s) within the  areas targeted by this assay, and inadequate number of viral copies  (<250 copies / mL). A negative result must be combined with clinical  observations, patient history, and epidemiological information. If result is POSITIVE SARS-CoV-2 target nucleic acids are DETECTED. The SARS-CoV-2 RNA is generally detectable in upper and lower  respiratory specimens dur ing the acute phase of infection.  Positive  results are indicative of active infection with SARS-CoV-2.  Clinical  correlation with patient history and other diagnostic information is  necessary to determine patient infection status.  Positive results do  not rule out bacterial infection or co-infection with other viruses. If result is PRESUMPTIVE POSTIVE SARS-CoV-2 nucleic acids MAY BE PRESENT.   A presumptive positive result was obtained on the submitted specimen  and confirmed on repeat testing.  While 2019 novel coronavirus  (SARS-CoV-2) nucleic acids may be present in the submitted sample  additional confirmatory testing may be necessary for epidemiological  and / or clinical  management purposes  to differentiate between  SARS-CoV-2 and other Sarbecovirus currently known to infect humans.  If clinically indicated additional testing with an alternate test  methodology [phone]) is advised. The SARS-CoV-2 RNA is generally  detectable in upper and lower respiratory sp ecimens during the acute  phase of infection. The expected result is Negative. Fact Sheet for Patients:  StrictlyIdeas.no Fact Sheet for Healthcare Providers: BankingDealers.co.za This test is not yet approved or cleared by the [place]  FDA and has been authorized for detection and/or diagnosis of SARS-CoV-2 by FDA under an Emergency Use Authorization (EUA).  This EUA will remain in effect (meaning this test can be used) for the duration of the COVID-19 declaration under Section 564(b)(1) of the Act, 21 U.S.C. section 360bbb-3(b)(1), unless the authorization is terminated or revoked sooner. Performed at [facility], [address], [place], [place] [address]   Urine culture     Status: Abnormal   Collection Time: 04/17/19 10:08 PM   Specimen: Urine, Random  Result Value Ref Range Status   Specimen Description   Final    URINE, RANDOM Performed at [facility], [address] [place], [place] [address]    Special Requests   Final    NONE Performed at [facility], [address] [place], [place] [address]    Culture >=100,000 COLONIES/mL ENTEROCOCCUS FAECALIS (A)  Final   Report Status 04/19/2019 FINAL  Final   Organism ID, Bacteria ENTEROCOCCUS FAECALIS (A)  Final      Susceptibility   Enterococcus faecalis - MIC*    AMPICILLIN <=2 SENSITIVE Sensitive     LEVOFLOXACIN 1 SENSITIVE Sensitive     NITROFURANTOIN <=16 SENSITIVE Sensitive     VANCOMYCIN 1 SENSITIVE Sensitive     * >=100,000 COLONIES/mL ENTEROCOCCUS FAECALIS  Culture, blood (routine x 2)     Status: Abnormal   Collection Time: 04/18/19 10:08  AM   Specimen: BLOOD RIGHT HAND  Result Value Ref Range Status   Specimen Description   Final    BLOOD RIGHT HAND Performed at [facility], [place] [address], [place], [place] [address]    Special Requests   Final    BOTTLES DRAWN AEROBIC AND ANAEROBIC Blood Culture adequate volume Performed at [facility], [place] [address] [place], [place] [address]    Culture  Setup Time   Final    GRAM POSITIVE COCCI IN BOTH AEROBIC AND ANAEROBIC BOTTLES CRITICAL VALUE NOTED.  VALUE IS CONSISTENT WITH PREVIOUSLY REPORTED AND CALLED VALUE. Performed at [facility], [address], [place], [place] [address]    Culture (A)  Final    ENTEROCOCCUS FAECALIS SUSCEPTIBILITIES PERFORMED ON PREVIOUS CULTURE WITHIN THE LAST 5 DAYS. Performed at [facility], [place] [address], [place], [place] [address]    Report Status 04/20/2019 FINAL  Final  SARS Coronavirus 2 [facility] order, Performed in [facility] hospital lab) Nasopharyngeal Nasopharyngeal Swab     Status: None   Collection Time: 04/18/19 10:08 AM   Specimen: Nasopharyngeal Swab  Result Value Ref Range Status   SARS Coronavirus 2 NEGATIVE NEGATIVE Final    Comment: (NOTE) If result is NEGATIVE SARS-CoV-2 target nucleic acids are NOT DETECTED. The SARS-CoV-2 RNA is generally detectable in upper and lower  respiratory specimens during the acute phase of infection. The lowest  concentration of SARS-CoV-2 viral copies this assay can detect is 250  copies / mL. A negative result does not preclude SARS-CoV-2 infection  and should not be used as the sole basis for treatment or other  patient management decisions.  A negative result may occur with  improper specimen collection / handling, submission of specimen other  than nasopharyngeal swab, presence of viral mutation(s) within the  areas targeted by this assay, and inadequate number of viral copies  (<250 copies / mL). A negative result must be combined with clinical   observations, patient history, and epidemiological information. If result is POSITIVE SARS-CoV-2 target  nucleic acids are DETECTED. The SARS-CoV-2 RNA is generally detectable in upper and lower  respiratory specimens dur ing the acute phase of infection.  Positive  results are indicative of active infection with SARS-CoV-2.  Clinical  correlation with patient history and other diagnostic information is  necessary to determine patient infection status.  Positive results do  not rule out bacterial infection or co-infection with other viruses. If result is PRESUMPTIVE POSTIVE SARS-CoV-2 nucleic acids MAY BE PRESENT.   A presumptive positive result was obtained on the submitted specimen  and confirmed on repeat testing.  While 2019 novel coronavirus  (SARS-CoV-2) nucleic acids may be present in the submitted sample  additional confirmatory testing may be necessary for epidemiological  and / or clinical management purposes  to differentiate between  SARS-CoV-2 and other Sarbecovirus currently known to infect humans.  If clinically indicated additional testing with an alternate test  methodology [phone]) is advised. The SARS-CoV-2 RNA is generally  detectable in upper and lower respiratory sp ecimens during the acute  phase of infection. The expected result is Negative. Fact Sheet for Patients:  StrictlyIdeas.no Fact Sheet for Healthcare Providers: BankingDealers.co.za This test is not yet approved or cleared by the [place] FDA and has been authorized for detection and/or diagnosis of SARS-CoV-2 by FDA under an Emergency Use Authorization (EUA).  This EUA will remain in effect (meaning this test can be used) for the duration of the COVID-19 declaration under Section 564(b)(1) of the Act, 21 U.S.C. section 360bbb-3(b)(1), unless the authorization is terminated or revoked sooner. Performed at [facility], [address] [place], [place] [address]   Culture, blood (routine x 2)     Status: Abnormal   Collection Time: 04/18/19 10:09 AM   Specimen: BLOOD LEFT HAND  Result Value Ref Range Status   Specimen Description   Final    BLOOD LEFT HAND Performed at [facility], [place] [address] [place], [place] [address]    Special Requests   Final    BOTTLES DRAWN AEROBIC AND ANAEROBIC Blood Culture adequate volume Performed at [facility], [address] [place], [place] [address]    Culture  Setup Time   Final    GRAM POSITIVE COCCI ANAEROBIC BOTTLE ONLY CRITICAL RESULT CALLED TO, READ BACK BY AND VERIFIED WITH: C/DAVID Melvern Sample 04/19/2019 0620 SJL    Culture (A)  Final    ENTEROCOCCUS FAECALIS SUSCEPTIBILITIES PERFORMED ON PREVIOUS CULTURE WITHIN THE LAST 5 DAYS. Performed at [facility], [place] [address], [place],  [address]    Report Status 04/20/2019 FINAL  Final  Blood Culture ID Panel (Reflexed)     Status: Abnormal   Collection Time: 04/18/19 10:09 AM  Result Value Ref Range Status   Enterococcus species DETECTED (A) NOT DETECTED Final    Comment: CRITICAL RESULT CALLED TO, READ BACK BY AND VERIFIED WITH: DAVID BASANTI 04/19/2019 0620 SJL    Vancomycin resistance NOT DETECTED NOT DETECTED Final   Listeria monocytogenes NOT DETECTED NOT DETECTED Final   Staphylococcus species NOT DETECTED NOT DETECTED Final   Staphylococcus aureus (BCID) NOT DETECTED NOT DETECTED Final   Streptococcus species NOT DETECTED NOT DETECTED Final   Streptococcus agalactiae NOT DETECTED NOT DETECTED Final   Streptococcus pneumoniae NOT DETECTED NOT DETECTED Final   Streptococcus pyogenes NOT DETECTED NOT DETECTED Final   Acinetobacter baumannii NOT DETECTED NOT DETECTED Final   Enterobacteriaceae species NOT DETECTED NOT DETECTED Final   Enterobacter cloacae complex NOT DETECTED NOT  DETECTED Final   Escherichia coli NOT DETECTED NOT DETECTED Final   Klebsiella oxytoca NOT DETECTED NOT  DETECTED Final   Klebsiella pneumoniae NOT DETECTED NOT DETECTED Final   Proteus species NOT DETECTED NOT DETECTED Final   Serratia marcescens NOT DETECTED NOT DETECTED Final   Haemophilus influenzae NOT DETECTED NOT DETECTED Final   Neisseria meningitidis NOT DETECTED NOT DETECTED Final   Pseudomonas aeruginosa NOT DETECTED NOT DETECTED Final   Candida albicans NOT DETECTED NOT DETECTED Final   Candida glabrata NOT DETECTED NOT DETECTED Final   Candida krusei NOT DETECTED NOT DETECTED Final   Candida parapsilosis NOT DETECTED NOT DETECTED Final   Candida tropicalis NOT DETECTED NOT DETECTED Final    Comment: Performed at [facility], [address] [place], [place] [address]  Urine culture     Status: Abnormal   Collection Time: 04/18/19 10:20 AM   Specimen: Urine, Clean Catch  Result Value Ref Range Status   Specimen Description   Final    URINE, CLEAN CATCH Performed at [facility], [address], [place], [place] [address]    Special Requests   Final    NONE Performed at [facility], [address] [place], [place] [address]    Culture >=100,000 COLONIES/mL ENTEROCOCCUS FAECALIS (A)  Final   Report Status 04/20/2019 FINAL  Final   Organism ID, Bacteria ENTEROCOCCUS FAECALIS (A)  Final      Susceptibility   Enterococcus faecalis - MIC*    AMPICILLIN <=2 SENSITIVE Sensitive     LEVOFLOXACIN 1 SENSITIVE Sensitive     NITROFURANTOIN <=16 SENSITIVE Sensitive     VANCOMYCIN 1 SENSITIVE Sensitive     * >=100,000 COLONIES/mL ENTEROCOCCUS FAECALIS  CULTURE, BLOOD (ROUTINE X 2) w Reflex to ID Panel     Status: None (Preliminary result)   Collection Time: 04/20/19  2:07 PM   Specimen: BLOOD  Result Value Ref Range Status   Specimen Description BLOOD RIGHT ANTECUBITAL  Final   Special Requests   Final    BOTTLES DRAWN AEROBIC AND ANAEROBIC Blood Culture adequate volume   Culture   Final    NO GROWTH 3 DAYS Performed at [facility],  [address] [place], [place] [address]    Report Status PENDING  Incomplete  CULTURE, BLOOD (ROUTINE X 2) w Reflex to ID Panel     Status: None (Preliminary result)   Collection Time: 04/20/19  2:16 PM   Specimen: BLOOD  Result Value Ref Range Status   Specimen Description BLOOD LEFT ANTECUBITAL  Final   Special Requests   Final    BOTTLES DRAWN AEROBIC AND ANAEROBIC Blood Culture adequate volume   Culture   Final    NO GROWTH 3 DAYS Performed at [facility], [address] [place],  [address]    Report Status PENDING  Incomplete       Today   Subjective:   Beaux Rahlf today has no abdominal pain, nausea, vomiting, eager to go home.  We will discharge the patient after today dose of IV antibiotics.  He has a home health nurse starting from tomorrow.  Objective:   Blood pressure (!) 161/79, pulse 89, temperature 97.6 F (36.4 C), temperature source Oral, resp. rate 20, height 5\' 9"  (1.753 m), weight 108.1 kg, SpO2 97 %.   Intake/Output Summary (Last 24 hours) at 04/23/2019 1117 Last data filed at 04/23/2019 0931 Gross per 24 hour  Intake 1120.76 ml  Output 1925 ml  Net -  804.24 ml    Exam Awake Alert, Oriented x 3, No new F.N deficits, Normal affect [place].AT,PERRAL Supple Neck,No JVD, No cervical lymphadenopathy appriciated.  Symmetrical Chest wall movement, Good air movement bilaterally, CTAB RRR,No Gallops,Rubs or new Murmurs, No Parasternal Heave +ve B.Sounds, Abd Soft, Non tender, No organomegaly appriciated, No rebound -guarding or rigidity. No Cyanosis, Clubbing or edema, No new Rash or bruise  Data Review   CBC w Diff:  Lab Results  Component Value Date   WBC 10.8 (H) 04/22/2019   HGB 10.9 (L) 04/22/2019   HCT 33.0 (L) 04/22/2019   PLT 190 04/22/2019   LYMPHOPCT 29 04/18/2019   MONOPCT 14 04/18/2019   EOSPCT 1 04/18/2019   BASOPCT 0 04/18/2019    CMP:  Lab Results  Component Value Date   NA 136 04/22/2019   K 3.6  04/22/2019   CL 105 04/22/2019   CO2 26 04/22/2019   BUN 12 04/22/2019   CREATININE 0.92 04/22/2019   PROT 6.9 04/18/2019   ALBUMIN 3.1 (L) 04/18/2019   BILITOT 0.9 04/18/2019   ALKPHOS 45 04/18/2019   AST 50 (H) 04/18/2019   ALT 39 04/18/2019  .   Total Time in preparing paper work, data evaluation and todays exam - 35 minutes  Epifanio Lesches M.D on 04/23/2019 at 11:17 AM    Note: This dictation was prepared with Dragon dictation along with smaller phrase technology. Any transcriptional errors that result from this process are unintentional.

## 2019-04-23 NOTE — Consult Note (Signed)
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 To discharging provider:  please sign these orders via discharge navigator,  Select New Orders & click on the button choice - Manage This Unsigned Work.     Thank you for allowing pharmacy to be a part of this patient's care.  Jonathan Cross 04/23/2019, 9:59 AM

## 2019-04-23 NOTE — Progress Notes (Signed)
Jonathan Cross. to be D/C'd home per MD order.  Discussed prescriptions and follow up appointments with the patient. Prescriptions given to patient, medication list explained in detail. Pt verbalized understanding.  Allergies as of 04/23/2019      Reactions   Metformin    Loose stools even with XR      Medication List    STOP taking these medications   azithromycin 250 MG tablet Commonly known as: Zithromax Z-Pak   NovoLIN 70/30 ReliOn (70-30) 100 UNIT/ML injection Generic drug: insulin NPH-regular Human Replaced by: insulin aspart protamine- aspart (70-30) 100 UNIT/ML injection     TAKE these medications   amLODipine 5 MG tablet Commonly known as: NORVASC Take 5 mg by mouth daily.   ampicillin  IVPB Inject 2 g into the vein every 6 (six) hours for 7 days. Indication: Enterococcal bacteremia with stag horn calculus and uncomplicated UTI Last Day of Therapy:  04/29/19 Labs - Once weekly:  CBC/D and CMP, please fax to [phone] Please pull PIC at completion of IV antibiotics Call with any questions: [phone]   aspirin EC 81 MG tablet Take 81 mg by mouth daily.   atorvastatin 80 MG tablet Commonly known as: LIPITOR Take 80 mg by mouth daily.   insulin aspart 100 UNIT/ML injection Commonly known as: novoLOG Inject 0-15 Units into the skin 3 (three) times daily with meals.   insulin aspart 100 UNIT/ML injection Commonly known as: novoLOG Inject 0-5 Units into the skin at bedtime.   insulin aspart protamine- aspart (70-30) 100 UNIT/ML injection Commonly known as: NOVOLOG MIX 70/30 Inject 0.3 mLs (30 Units total) into the skin 2 (two) times daily with a meal. Replaces: NovoLIN 70/30 ReliOn (70-30) 100 UNIT/ML injection   isosorbide mononitrate 30 MG 24 hr tablet Commonly known as: IMDUR Take 30 mg by mouth daily.   lisinopril 40 MG tablet Commonly known as: ZESTRIL Take 40 mg by mouth daily.   loratadine 10 MG tablet Commonly known as: CLARITIN Take  10 mg by mouth daily.   metoprolol succinate 25 MG 24 hr tablet Commonly known as: TOPROL-XL Take 25 mg by mouth daily.   nitroGLYCERIN 0.4 MG SL tablet Commonly known as: NITROSTAT Place 0.4 mg under the tongue as needed.   omeprazole 20 MG capsule Commonly known as: PRILOSEC Take 20 mg by mouth daily.            Home Infusion Instuctions  (From admission, onward)         Start     Ordered   04/23/19 0000  Home infusion instructions [facility] May follow [facility] Dosing Protocol; May administer Cathflo as needed to maintain patency of vascular access device.; Flushing of vascular access device: per [facility] Protocol: 0.9% NaCl pre/post medica...    Question Answer Comment  Instructions May follow [facility] Dosing Protocol   Instructions May administer Cathflo as needed to maintain patency of vascular access device.   Instructions Flushing of vascular access device: per [facility] Protocol: 0.9% NaCl pre/post medication administration and prn patency; Heparin 100 u/ml, 84ml for implanted ports and Heparin 10u/ml, 57ml for all other central venous catheters.   Instructions May follow [facility] Anaphylaxis Protocol for First Dose Administration in the home: 0.9% NaCl at 25-50 ml/hr to maintain IV access for protocol meds. Epinephrine 0.3 ml IV/IM PRN and Benadryl 25-50 IV/IM PRN s/s of anaphylaxis.   Instructions [facility] Infusion Coordinator (RN) to assist per patient IV care needs in the home  PRN.      04/23/19 [id number]          Vitals:   04/23/19 0546 04/23/19 0933  BP: (!) 160/81 (!) 161/79  Pulse: 82 89  Resp: 20   Temp: 98.6 F (37 C) 97.6 F (36.4 C)  SpO2: 97% 97%    Skin clean, dry and intact without evidence of skin break down, no evidence of skin tears noted. IV catheter discontinued intact. Site without signs and symptoms of complications. Dressing and pressure applied. Pt denies pain at this time. No complaints noted.  An After Visit Summary was printed  and given to the patient. Patient escorted via [place], and D/C home via private auto.  Fuller Mandril, RN

## 2019-04-23 NOTE — Progress Notes (Signed)
Dr. Vianne Bulls printed prescription for pain medicine for patient. Son has been called. Will meet Son at main entrance to hand off prescription.   Fuller Mandril, RN

## 2019-04-23 NOTE — Progress Notes (Signed)
Patient's Garnette Gunner [phone] called in regards to pain medication PRN for calculus. During discharge patient did not request pain medication. Stated to patient's Son I would need to contact the physician that discharged the patient and get back to him.   Fuller Mandril, RN

## 2019-04-23 NOTE — Discharge Instructions (Signed)
Dose: 0-15 Units Freq: 3 times daily with meals Route: [place] Start: 04/18/19 1700   Admin Instructions:  Do NOT hold if patient is NPO. Moderate Scale.   Order specific questions:  Correction coverage: Moderate (average weight, post-op) CBG < 70: Implement Hypoglycemia Standing Orders and refer to Hypoglycemia Standing Orders sidebar report CBG 70 - 120: 0 units CBG 121 - 150: 2 units CBG 151 - 200: 3 units CBG 201 - 250: 5 units CBG 251 - 300: 8 units CBG 301 - 350: 11 units CBG 351 - 400: 15 units CBG > 400 call MD and obtain STAT lab verification

## 2019-04-25 ENCOUNTER — Emergency Department
Admission: EM | Admit: 2019-04-25 | Discharge: 2019-04-25 | Disposition: A | Discharge status: Home / Self Care | Payer: Medicare HMO | Attending: Student in an Organized Health Care Education/Training Program | Admitting: Student in an Organized Health Care Education/Training Program

## 2019-04-25 ENCOUNTER — Other Ambulatory Visit: Payer: Self-pay

## 2019-04-25 DIAGNOSIS — Z7982 Long term (current) use of aspirin: Secondary | ICD-10-CM | POA: Insufficient documentation

## 2019-04-25 DIAGNOSIS — E119 Type 2 diabetes mellitus without complications: Secondary | ICD-10-CM | POA: Diagnosis not present

## 2019-04-25 DIAGNOSIS — T829XXA Unspecified complication of cardiac and vascular prosthetic device, implant and graft, initial encounter: Secondary | ICD-10-CM

## 2019-04-25 DIAGNOSIS — Z79899 Other long term (current) drug therapy: Secondary | ICD-10-CM | POA: Diagnosis not present

## 2019-04-25 DIAGNOSIS — I259 Chronic ischemic heart disease, unspecified: Secondary | ICD-10-CM | POA: Insufficient documentation

## 2019-04-25 DIAGNOSIS — Y812 Prosthetic and other implants, materials and accessory general- and plastic-surgery devices associated with adverse incidents: Secondary | ICD-10-CM | POA: Insufficient documentation

## 2019-04-25 DIAGNOSIS — T82898A Other specified complication of vascular prosthetic devices, implants and grafts, initial encounter: Secondary | ICD-10-CM | POA: Insufficient documentation

## 2019-04-25 DIAGNOSIS — Z794 Long term (current) use of insulin: Secondary | ICD-10-CM | POA: Insufficient documentation

## 2019-04-25 DIAGNOSIS — I10 Essential (primary) hypertension: Secondary | ICD-10-CM | POA: Diagnosis not present

## 2019-04-25 LAB — CULTURE, BLOOD (ROUTINE X 2)
Culture: NO GROWTH
Culture: NO GROWTH
Special Requests: ADEQUATE
Special Requests: ADEQUATE

## 2019-04-25 NOTE — ED Triage Notes (Signed)
First Nurse Note:  Arrives with son.  Patient has left upper arm PICC / Mid line in place.  Per son, line started coming out today.  Occlusive dressing in place, approximately 1 inch of iv catheter seen out of skin and kinked.  Referred to ED by home health to have IV team replace line.  Patient is AAOx3.  Skin warm and dry. NAD

## 2019-04-25 NOTE — Progress Notes (Signed)
L cephalic midline assessed, found to be out 2cm and kinked. Pt reports he rolled over on it and felt it pull. Midline removed and new midline placed L basilic vein. Teaching reviewed with pt and son. Questions answered. Written information given as well.

## 2019-04-25 NOTE — ED Notes (Signed)
IV team in with pt 

## 2019-04-25 NOTE — ED Triage Notes (Signed)
Pt comes via POV from home with c/o PICC line issue. Pt has left upper arm PICC/mid line in place. Pt states he noticed this am when he work up that it looked messed up. Pt states at noon they were not able to give him his medication.  PICC line looks to have some tubing out of place. No discharge os bleeding noted.  Pt denies any CP, SOB, fever, chills or headache.  Pt just needs to be evaluated by IV team and PICC line fixed.

## 2019-04-25 NOTE — ED Provider Notes (Signed)
[facility] Emergency Department Provider Note  ____________________________________________  Time seen: Approximately 7:09 PM  I have reviewed the triage vital signs and the nursing notes.   HISTORY  Chief Complaint PICC Line issue   HPI Jonathan Cross. is a 73 y.o. male presents to the emergency department due to to PICC line issue.  When he woke this morning he thought it looked "messed up."  At noon home health came to give medication and it did not function.  Catheter appears to be dislodged. No active bleeding from insertion site.  Past Medical History:  Diagnosis Date  . Coronary artery disease   . Diabetes ([place])   . H/O aortic valve replacement   . Hx of CABG   . Hypertension     Patient Active Problem List   Diagnosis Date Noted  . Bacteremia 04/18/2019    Past Surgical History:  Procedure Laterality Date  . TEE WITHOUT CARDIOVERSION N/A 04/21/2019   Procedure: TRANSESOPHAGEAL ECHOCARDIOGRAM (TEE);  Surgeon: Minna Merritts, MD;  Location: [facility] ORS;  Service: Cardiovascular;  Laterality: N/A;    Prior to Admission medications   Medication Sig Start Date End Date Taking? Authorizing Provider  amLODipine (NORVASC) 5 MG tablet Take 5 mg by mouth daily. 03/08/19   [provider]  ampicillin IVPB Inject 2 g into the vein every 6 (six) hours for 7 days. Indication: Enterococcal bacteremia with stag horn calculus and uncomplicated UTI Last Day of Therapy:  04/29/19 Labs - Once weekly:  CBC/D and CMP, please fax to [phone] Please pull PIC at completion of IV antibiotics Call with any questions: [phone] 04/23/19 04/30/19  Epifanio Lesches, MD  aspirin EC 81 MG tablet Take 81 mg by mouth daily. 05/30/14   [provider]  atorvastatin (LIPITOR) 80 MG tablet Take 80 mg by mouth daily. 08/13/18   [provider]  insulin aspart (NOVOLOG) 100 UNIT/ML injection Inject 0-15 Units into the skin 3 (three)  times daily with meals. 04/23/19   Epifanio Lesches, MD  insulin aspart (NOVOLOG) 100 UNIT/ML injection Inject 0-5 Units into the skin at bedtime. 04/23/19   Epifanio Lesches, MD  insulin aspart protamine- aspart (NOVOLOG MIX 70/30) (70-30) 100 UNIT/ML injection Inject 0.3 mLs (30 Units total) into the skin 2 (two) times daily with a meal. 04/23/19   Epifanio Lesches, MD  isosorbide mononitrate (IMDUR) 30 MG 24 hr tablet Take 30 mg by mouth daily. 09/13/18 09/13/19  [provider]  lisinopril (ZESTRIL) 40 MG tablet Take 40 mg by mouth daily. 12/08/18 09/04/19  [provider]  loratadine (CLARITIN) 10 MG tablet Take 10 mg by mouth daily. 09/13/18 09/13/19  [provider]  metoprolol succinate (TOPROL-XL) 25 MG 24 hr tablet Take 25 mg by mouth daily. 12/08/18 09/04/19  [provider]  nitroGLYCERIN (NITROSTAT) 0.4 MG SL tablet Place 0.4 mg under the tongue as needed. 03/24/18 06/17/19  [provider]  omeprazole (PRILOSEC) 20 MG capsule Take 20 mg by mouth daily. 04/22/18 04/22/19  [provider]  oxyCODONE-acetaminophen (PERCOCET) 7.5-325 MG tablet Take 1 tablet by mouth every 4 (four) hours as needed for severe pain. 04/23/19   Epifanio Lesches, MD    Allergies Metformin  Family History  Problem Relation Age of Onset  . CAD Father     Social History Social History   Tobacco Use  . Smoking status: Never Smoker  . Smokeless tobacco: Never Used  Substance Use Topics  . Alcohol use: Never  Frequency: Never  . Drug use: Never    Review of Systems Constitutional: Negative for fever. ENT: Negative for sore throat. Respiratory: Negative for cough or shortness of breath Gastrointestinal: No abdominal pain.  No nausea, no vomiting.  No diarrhea.  Musculoskeletal: Negative for generalized body aches. Skin: Negative for rash/lesion/wound.  Positive for PICC line in left upper arm Neurological: Negative for headaches, focal  weakness or numbness.  ____________________________________________   PHYSICAL EXAM:  VITAL SIGNS: ED Triage Vitals  Enc Vitals Group     BP 04/25/19 1718 134/73     Pulse Rate 04/25/19 1718 90     Resp 04/25/19 1718 18     Temp 04/25/19 1718 99.2 F (37.3 C)     Temp src --      SpO2 04/25/19 1718 93 %     Weight 04/25/19 1719 247 lb (112 kg)     Height 04/25/19 1719 5\' 9"  (1.753 m)     Head Circumference --      Peak Flow --      Pain Score 04/25/19 1719 0     Pain Loc --      Pain Edu? --      Excl. in [place]? --     Constitutional: Alert and oriented. Well appearing and in no acute distress. Eyes: Conjunctivae are normal. PERRL. EOMI. Head: Atraumatic. Nose: No congestion/rhinnorhea. Mouth/Throat: Mucous membranes are moist. Neck: No stridor.  Cardiovascular: Normal rate, regular rhythm. Good peripheral circulation. Respiratory: Normal respiratory effort. Musculoskeletal: Full ROM throughout.  Neurologic:  Normal speech and language. No gross focal neurologic deficits are appreciated. Speech is normal. No gait instability. Skin:  Skin is warm, dry and intact. No rash noted. Psychiatric: Mood and affect are normal. Speech and behavior are normal.  ____________________________________________   LABS (all labs ordered are listed, but only abnormal results are displayed)  Labs Reviewed - No data to display ____________________________________________  EKG  Not indicated ____________________________________________  RADIOLOGY  Not indicated ____________________________________________   PROCEDURES  PICC line insertion by IV team ____________________________________________   INITIAL IMPRESSION / ASSESSMENT AND PLAN / ED COURSE     Pertinent labs & imaging results that were available during my care of the patient were reviewed by me and considered in my medical decision making (see chart for details).  Patient was advised to follow-up with his primary  care provider or return to the emergency department for symptoms of concern. ____________________________________________   FINAL CLINICAL IMPRESSION(S) / ED DIAGNOSES  Final diagnoses:  Complication of intravascular catheter, initial encounter       Victorino Dike, FNP 04/25/19 1913    Merlyn Lot, MD 04/25/19 2044

## 2019-04-27 ENCOUNTER — Ambulatory Visit: Payer: Self-pay | Admitting: Urology

## 2019-05-02 ENCOUNTER — Ambulatory Visit: Payer: Self-pay | Admitting: Urology

## 2019-05-02 ENCOUNTER — Other Ambulatory Visit: Payer: Self-pay | Admitting: Internal Medicine

## 2019-05-03 ENCOUNTER — Encounter: Payer: Self-pay | Admitting: Infectious Diseases

## 2019-05-03 ENCOUNTER — Ambulatory Visit: Payer: Medicare HMO | Attending: Infectious Diseases | Admitting: Infectious Diseases

## 2019-05-03 ENCOUNTER — Other Ambulatory Visit: Payer: Self-pay

## 2019-05-03 VITALS — BP 125/75 | HR 92 | Temp 98.3°F | Ht 69.0 in

## 2019-05-03 DIAGNOSIS — B952 Enterococcus as the cause of diseases classified elsewhere: Secondary | ICD-10-CM | POA: Diagnosis not present

## 2019-05-03 DIAGNOSIS — R7881 Bacteremia: Secondary | ICD-10-CM | POA: Diagnosis not present

## 2019-05-03 DIAGNOSIS — Z888 Allergy status to other drugs, medicaments and biological substances status: Secondary | ICD-10-CM

## 2019-05-03 DIAGNOSIS — I10 Essential (primary) hypertension: Secondary | ICD-10-CM

## 2019-05-03 DIAGNOSIS — N390 Urinary tract infection, site not specified: Secondary | ICD-10-CM | POA: Diagnosis not present

## 2019-05-03 DIAGNOSIS — E118 Type 2 diabetes mellitus with unspecified complications: Secondary | ICD-10-CM | POA: Diagnosis not present

## 2019-05-03 DIAGNOSIS — Z952 Presence of prosthetic heart valve: Secondary | ICD-10-CM

## 2019-05-03 DIAGNOSIS — Z87442 Personal history of urinary calculi: Secondary | ICD-10-CM

## 2019-05-03 DIAGNOSIS — I2510 Atherosclerotic heart disease of native coronary artery without angina pectoris: Secondary | ICD-10-CM

## 2019-05-03 DIAGNOSIS — Z794 Long term (current) use of insulin: Secondary | ICD-10-CM

## 2019-05-03 DIAGNOSIS — Z951 Presence of aortocoronary bypass graft: Secondary | ICD-10-CM

## 2019-05-03 NOTE — Progress Notes (Signed)
NAME: Jonathan Cross.  DOB: [birth year]  MRN: [id number]  Date/Time: 05/03/2019 11:37 AM   Subjective:  Follow-up after recent hospital discharge ? Jonathan Cross. is a 73 y.o. male with a history of coronary artery disease status post bypass, aortic valve replacement, hypertension, diabetes mellitus was admitted to [facility] between 04/18/2019 until 04/23/2019 for fever cough and decreased appetite.  Patient has not been feeling well for the past 4 weeks.  He had noted recently increased urination especially at nighttime with some dysuria.  He was found to have a temperature of 101.5.  Cultures grew Enterococcus in the blood in the urine.  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  There was mild right hydronephrosis.  There was no hydroureter.  TEE was done to rule out endocarditis.  Repeat blood culture from 04/20/2019 was negative for Enterococcus. Patient was seen by urology and was asked to follow-up as outpatient.  Patient was sent home to complete 10 days of IV ampicillin which he finished on 04/29/2019 and the PICC line has been removed.  He had a urology appointment on 04/28/2019 but it has been postponed to 05/12/2019 as per patient's wish. Patient is here today with his son.  He wants to know whether he can go to work. He does not have any fever He is very tired. No cough Minimal shortness of breath No edema legs No diarrhea  Past Medical History:  Diagnosis Date  . Coronary artery disease   . Diabetes ([place])   . H/O aortic valve replacement   . Hx of CABG   . Hypertension     Past Surgical History:  Procedure Laterality Date  . TEE WITHOUT CARDIOVERSION N/A 04/21/2019   Procedure: TRANSESOPHAGEAL ECHOCARDIOGRAM (TEE);  Surgeon: Minna Merritts, MD;  Location: [facility] ORS;  Service: Cardiovascular;  Laterality: N/A;    Social History   Socioeconomic History  . Marital status: Married    Spouse name: Not  on file  . Number of children: Not on file  . Years of education: Not on file  . Highest education level: Not on file  Occupational History  . Not on file  Social Needs  . Financial resource strain: Not on file  . Food insecurity    Worry: Not on file    Inability: Not on file  . Transportation needs    Medical: Not on file    Non-medical: Not on file  Tobacco Use  . Smoking status: Never Smoker  . Smokeless tobacco: Never Used  Substance and Sexual Activity  . Alcohol use: Never    Frequency: Never  . Drug use: Never  . Sexual activity: Not on file  Lifestyle  . Physical activity    Days per week: Not on file    Minutes per session: Not on file  . Stress: Not on file  Relationships  . Social Herbalist on phone: Not on file    Gets together: Not on file    Attends religious service: Not on file    Active member of club or organization: Not on file    Attends meetings of clubs or organizations: Not on file    Relationship status: Not on file  . Intimate partner violence    Fear of current or ex partner: Not on file    Emotionally abused: Not on file    Physically abused: Not  on file    Forced sexual activity: Not on file  Other Topics Concern  . Not on file  Social History Narrative  . Not on file    Family History  Problem Relation Age of Onset  . CAD Father    Allergies  Allergen Reactions  . Metformin     Loose stools even with XR    ? Current Outpatient Medications  Medication Sig Dispense Refill  . amLODipine (NORVASC) 5 MG tablet Take 5 mg by mouth daily.    Marland Kitchen aspirin EC 81 MG tablet Take 81 mg by mouth daily.    Marland Kitchen atorvastatin (LIPITOR) 80 MG tablet Take 80 mg by mouth daily.    . insulin aspart protamine- aspart (NOVOLOG MIX 70/30) (70-30) 100 UNIT/ML injection Inject 0.3 mLs (30 Units total) into the skin 2 (two) times daily with a meal. 10 mL 11  . isosorbide mononitrate (IMDUR) 30 MG 24 hr tablet Take 30 mg by mouth daily.    Marland Kitchen  lisinopril (ZESTRIL) 40 MG tablet Take 40 mg by mouth daily.    Marland Kitchen loratadine (CLARITIN) 10 MG tablet Take 10 mg by mouth daily.    . metoprolol succinate (TOPROL-XL) 25 MG 24 hr tablet Take 25 mg by mouth daily.    . nitroGLYCERIN (NITROSTAT) 0.4 MG SL tablet Place 0.4 mg under the tongue as needed.    Marland Kitchen oxyCODONE-acetaminophen (PERCOCET) 7.5-325 MG tablet Take 1 tablet by mouth every 4 (four) hours as needed for severe pain. 20 tablet 0  . insulin aspart (NOVOLOG) 100 UNIT/ML injection Inject 0-15 Units into the skin 3 (three) times daily with meals. 10 mL 11  . insulin aspart (NOVOLOG) 100 UNIT/ML injection Inject 0-5 Units into the skin at bedtime. 10 mL 11  . omeprazole (PRILOSEC) 20 MG capsule Take 20 mg by mouth daily.     No current facility-administered medications for this visit.      Abtx:  Anti-infectives (From admission, onward)   None      REVIEW OF SYSTEMS:  Const: negative fever, negative chills, negative weight loss, generalized fatigue and weakness Eyes: negative diplopia or visual changes, negative eye pain ENT: negative coryza, negative sore throat Resp: negative cough, hemoptysis, dyspnea Cards: negative for chest pain, palpitations, lower extremity edema GU: negative for frequency, dysuria and hematuria GI: Negative for abdominal pain, diarrhea, bleeding, constipation Skin: negative for rash and pruritus Heme: negative for easy bruising and gum/nose bleeding MS:  muscle weakness, chronic back pain and leg pain Neurolo:negative for headaches, dizziness, vertigo, memory problems  Psych: negative for feelings of anxiety, depression  Endocrine has diabetes which is not well controlled Allergy/Immunology-Metformin Objective:  VITALS:  BP 125/75 (BP Location: Right Arm, Patient Position: Sitting, Cuff Size: Large)   Pulse 92   Temp 98.3 F (36.8 C) (Oral)   Ht 5\' 9"  (1.753 m)   BMI 36.48 kg/m  PHYSICAL EXAM:  General: Alert, cooperative, no distress, appears  stated age.  Head: Normocephalic, without obvious abnormality, atraumatic. Eyes: Conjunctivae clear, anicteric sclerae. Pupils are equal ENT Nares normal. No drainage or sinus tenderness. Lips, mucosa, and tongue normal. No Thrush Neck: Supple, symmetrical, no adenopathy, thyroid: non tender no carotid bruit and no JVD. Back: No CVA tenderness. Lungs: Clear to auscultation bilaterally. No Wheezing or Rhonchi. No rales. Heart: s1s2 Abdomen: Soft, obese Extremities: atraumatic, no cyanosis. No edema. No clubbing Skin: No rashes or lesions. Or bruising Lymph: Cervical, supraclavicular normal. Neurologic: Patient in wheelchair.  Did not examine the  CNS in detail.  But nonfocal Pertinent Labs Lab Results CBC 04/26/2019 Hemoglobin 10.2 WBC 5 Platelet 219 Creatinine 1.04  IMAGING RESULTS: None  ? Impression/Recommendation ? ?Enterococcus faecalis bacteremia treated with IV ampicillin. Secondary to urinary tract infection with the same organism. ?The urinary tract infection was secondary to right staghorn calculus with mild hydronephrosis without acute hydroureter.  He has completed 10 days of IV antibiotics. The initial plan was to follow-up with urology while on IV antibiotics to see whether he could have the stone taken care of.  But patient and his son decided to postpone the urology visit .  The next appointment is on 29 October.  Patient had many questions regarding the treatment of the staghorn calculus and whether he would have a stent placed.  Asked him to check with the urologist.  Patient son wanted to know whether he could go back to work.  Patient is a TEFL teacher. I told the patient that from an infectious perspective he is cleared to go to work but if he is tired and has no energy then he should postpone going back to work. Also asked him to follow-up with his primary care physician.  Diabetes mellitus on insulin not well controlled.  Coronary artery disease status  post CABG  Aortic valve replacement.  Recent TEE did not show any valve vegetation. Discussed the management with the patient and his son. We will see him as needed   note:  This document was prepared using Dragon voice recognition software and may include unintentional dictation errors.

## 2019-05-03 NOTE — Patient Instructions (Addendum)
You are here for follow up after recent hospitalization during which you were treated fro enterococcus bacteremia and UTI- you have a stag horn calculus and you have an appt with urologist. You took 10 days of IV ampicillin.You currently do not have an infection but before any procedue like cystoscopy the urologist will have to check your urine to make sure there is no bacteria and if so they will treat it.

## 2019-05-12 ENCOUNTER — Encounter: Payer: Self-pay | Admitting: Urology

## 2019-05-12 ENCOUNTER — Telehealth: Payer: Self-pay | Admitting: Internal Medicine

## 2019-05-12 ENCOUNTER — Other Ambulatory Visit: Payer: Self-pay | Admitting: Radiology

## 2019-05-12 ENCOUNTER — Ambulatory Visit (INDEPENDENT_AMBULATORY_CARE_PROVIDER_SITE_OTHER): Payer: Medicare HMO | Admitting: Urology

## 2019-05-12 ENCOUNTER — Ambulatory Visit (INDEPENDENT_AMBULATORY_CARE_PROVIDER_SITE_OTHER): Payer: Medicare HMO | Admitting: Internal Medicine

## 2019-05-12 ENCOUNTER — Other Ambulatory Visit: Payer: Self-pay

## 2019-05-12 ENCOUNTER — Encounter: Payer: Self-pay | Admitting: Internal Medicine

## 2019-05-12 VITALS — BP 112/62 | HR 102 | Ht 69.0 in | Wt 243.8 lb

## 2019-05-12 VITALS — BP 121/69 | HR 108 | Ht 69.0 in | Wt 246.0 lb

## 2019-05-12 DIAGNOSIS — E785 Hyperlipidemia, unspecified: Secondary | ICD-10-CM

## 2019-05-12 DIAGNOSIS — I350 Nonrheumatic aortic (valve) stenosis: Secondary | ICD-10-CM | POA: Diagnosis not present

## 2019-05-12 DIAGNOSIS — Z953 Presence of xenogenic heart valve: Secondary | ICD-10-CM | POA: Diagnosis not present

## 2019-05-12 DIAGNOSIS — N200 Calculus of kidney: Secondary | ICD-10-CM

## 2019-05-12 DIAGNOSIS — I2510 Atherosclerotic heart disease of native coronary artery without angina pectoris: Secondary | ICD-10-CM

## 2019-05-12 DIAGNOSIS — I25118 Atherosclerotic heart disease of native coronary artery with other forms of angina pectoris: Secondary | ICD-10-CM | POA: Diagnosis not present

## 2019-05-12 DIAGNOSIS — Z951 Presence of aortocoronary bypass graft: Secondary | ICD-10-CM

## 2019-05-12 DIAGNOSIS — I10 Essential (primary) hypertension: Secondary | ICD-10-CM | POA: Diagnosis not present

## 2019-05-12 DIAGNOSIS — Z01810 Encounter for preprocedural cardiovascular examination: Secondary | ICD-10-CM

## 2019-05-12 DIAGNOSIS — E1159 Type 2 diabetes mellitus with other circulatory complications: Secondary | ICD-10-CM

## 2019-05-12 DIAGNOSIS — Z952 Presence of prosthetic heart valve: Secondary | ICD-10-CM

## 2019-05-12 DIAGNOSIS — I471 Supraventricular tachycardia: Secondary | ICD-10-CM

## 2019-05-12 MED ORDER — ISOSORBIDE MONONITRATE ER 60 MG PO TB24: 60.0000 mg | ORAL_TABLET | Freq: Every day | ORAL | 3 refills | Status: DC

## 2019-05-12 MED ORDER — LISINOPRIL 20 MG PO TABS: 20.0000 mg | ORAL_TABLET | Freq: Every day | ORAL | 4 refills | Status: DC

## 2019-05-12 NOTE — Progress Notes (Signed)
New Outpatient Visit Date: 05/12/2019  Referring Provider: Billey Co, [place],  [place] [address]  Chief Complaint: Chest pain and preoperative cardiovascular evaluation  HPI:  Mr. Furrh is a 73 y.o. male who is being seen today for the evaluation of chest pain in the setting of ureteral stone requiring lithotripsy in 2 weeks at the request of Billey Co, MD. He has a history of coronary artery disease and aortic stenosis status post CABG and AVR at [facility] (2017), hypertension, hyperlipidemia, and type 2 diabetes mellitus.  Mr. Olm was admitted at [facility] earlier this month for sepsis with Enterococcus faecalis bacteremia.  TEE was negative for endocarditis.  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  Mr. Buchholtz has completed 2 weeks of IV antibiotics and is now scheduled for lithotripsy on 05/27/2019.  Mr. Arritt reports chronic chest pain ever since his CABG/AVR in 2017.  He was told that small vessel disease was contributing to his discomfort.  He describes the discomfort as a left-sided pressure that often happens when he sits around.  It usually lasts a minute or less.  It is different than what he experienced prior to his bypass surgery.  However, it has become more frequent over the last several months.  He has used sublingual nitroglycerin twice since his hospitalization earlier this month with prompt resolution of the pain.  Mr. Hartl denies shortness of breath, palpitations, and edema.  He reports intermittent orthostatic lightheadedness without syncope.  The lightheadedness was particularly severe yesterday when he bent over to pick up an object.  --------------------------------------------------------------------------------------------------  Cardiovascular History & Procedures: Cardiovascular Problems:  Coronary artery disease status post CABG  Aortic stenosis status post bioprosthetic aortic valve replacement  Risk  Factors:  No coronary artery disease, hypertension, diabetes mellitus, male gender, age greater than 42  Cath/PCI:  LHC (10/05/2017, [facility]): Significant three-vessel coronary disease with 80% ostial LAD and 70% OM1 stenoses.  50% stenosis of the proximal RPDA 90% lesion of small RPL branch.  Patent LIMA to LAD and SVG to OM.  LHC (02/08/2016): Significant two-vessel CAD with 80% ostial LAD and 90% RPL stenoses (small-caliber).  FFR of ostial LAD 0.80.  LVEDP 12 mmHg.  CV Surgery:  CABG/AVR (02/11/2016, [facility]): LIMA-LAD, SVG-OM, 23 mm bioprosthetic AVR.  EP Procedures and Devices:  None  Non-Invasive Evaluation(s):  TTE (10/12/2017): Technically difficult study.  LVEF 65-70%.  Mitral annular calcification.  Normal aortic prosthetic valve function.  Normal RV function.  TTE (01/14/2017): Normal LV size with LVEF 55-60%.  Grade 1 diastolic dysfunction.  Mitral annular calcification.  Aortic valve replacement with mean gradient of 13 mmHg.  Mildly dilated ascending aorta.  Low normal RV systolic function.  Pharmacologic myocardial perfusion stress test (08/01/2015): Normal study without ischemia or scar.  LVEF greater than 65%.  TTE (07/31/2015): Normal LV size with basal septal hypertrophy.  LVEF 55-60% with grade 1 diastolic dysfunction.  Normal RV size and function.  Mild to moderate aortic stenosis (mean gradient 20 mmHg).  TTE (07/31/2015): Normal LV size with mild LVH.  LVEF 60-65% with grade 1 diastolic dysfunction.  Moderate aortic stenosis (mean gradient 21 mmHg).  Normal RV size and function.  Recent CV Pertinent Labs: Lab Results  Component Value Date   INR 1.2 04/17/2019   K 3.6 04/22/2019   BUN 12 04/22/2019   CREATININE 0.92 04/22/2019    --------------------------------------------------------------------------------------------------  Past Medical History:  Diagnosis Date  . Coronary artery disease   .  Diabetes ([place])   . H/O aortic valve replacement   . Hx of CABG   .  Hyperlipidemia   . Hypertension     Past Surgical History:  Procedure Laterality Date  . AORTIC VALVE REPLACEMENT  2017   [facility], bioprosthetic  . CORONARY ARTERY BYPASS GRAFT  2017   [facility] - LIMA-LAD and SVG-OM  . TEE WITHOUT CARDIOVERSION N/A 04/21/2019   Procedure: TRANSESOPHAGEAL ECHOCARDIOGRAM (TEE);  Surgeon: Minna Merritts, MD;  Location: [facility] ORS;  Service: Cardiovascular;  Laterality: N/A;    Current Meds  Medication Sig  . amLODipine (NORVASC) 5 MG tablet Take 5 mg by mouth daily.  Marland Kitchen aspirin EC 81 MG tablet Take 81 mg by mouth daily.  Marland Kitchen atorvastatin (LIPITOR) 80 MG tablet Take 80 mg by mouth daily.  . empagliflozin (JARDIANCE) 10 MG TABS tablet Take by mouth.  Marland Kitchen glipiZIDE (GLUCOTROL XL) 2.5 MG 24 hr tablet Take 2.5 mg by mouth daily with breakfast.   . insulin aspart (NOVOLOG) 100 UNIT/ML injection Inject 0-15 Units into the skin 3 (three) times daily with meals.  . insulin aspart (NOVOLOG) 100 UNIT/ML injection Inject 0-5 Units into the skin at bedtime.  . insulin aspart protamine- aspart (NOVOLOG MIX 70/30) (70-30) 100 UNIT/ML injection Inject 0.3 mLs (30 Units total) into the skin 2 (two) times daily with a meal.  . lisinopril (ZESTRIL) 20 MG tablet Take 1 tablet (20 mg total) by mouth daily.  Marland Kitchen loratadine (CLARITIN) 10 MG tablet Take 10 mg by mouth daily.  . metoprolol succinate (TOPROL-XL) 25 MG 24 hr tablet Take 25 mg by mouth daily.  . nitroGLYCERIN (NITROSTAT) 0.4 MG SL tablet Place 0.4 mg under the tongue as needed.  Marland Kitchen omeprazole (PRILOSEC) 20 MG capsule Take 20 mg by mouth daily.  . [DISCONTINUED] isosorbide mononitrate (IMDUR) 30 MG 24 hr tablet Take 30 mg by mouth daily.  . [DISCONTINUED] lisinopril (ZESTRIL) 40 MG tablet Take 40 mg by mouth daily.    Allergies: Metformin  Social History   Tobacco Use  . Smoking status: Former Research scientist (life sciences)  . Smokeless tobacco: Never Used  Substance Use Topics  . Alcohol use: Never    Frequency: Never  . Drug use: Never     Family History  Problem Relation Age of Onset  . Heart attack Father 45    Review of Systems: A 12-system review of systems was performed and was negative except as noted in the HPI.  --------------------------------------------------------------------------------------------------  Physical Exam: BP 112/62 (BP Location: Right Arm, Patient Position: Sitting, Cuff Size: Large)   Pulse (!) 102   Ht 5\' 9"  (1.753 m)   Wt 243 lb 12 oz (110.6 kg)   SpO2 96%   BMI 36.00 kg/m   General:  NAD HEENT: No conjunctival pallor or scleral icterus.  Neck: Supple without lymphadenopathy, thyromegaly, JVD, or HJR. No carotid bruit. Lungs: Normal work of breathing. Clear to auscultation bilaterally without wheezes or crackles. Heart: Regular rate and rhythm with 1/6 systolic murmur.  No rubs or gallops. Non-displaced PMI. Abd: Bowel sounds present. Soft, NT/ND without hepatosplenomegaly Ext: No lower extremity edema. Radial, PT, and DP pulses are 2+ bilaterally Skin: Warm and dry without rash. Neuro: CNIII-XII intact. Strength and fine-touch sensation intact in upper and lower extremities bilaterally. Psych: Normal mood and affect.  EKG:  NSR with nonspecific T wave abnormality.  Lab Results  Component Value Date   WBC 10.8 (H) 04/22/2019   HGB 10.9 (L) 04/22/2019   HCT 33.0 (L) 04/22/2019  MCV 102.8 (H) 04/22/2019   PLT 190 04/22/2019    Lab Results  Component Value Date   NA 136 04/22/2019   K 3.6 04/22/2019   CL 105 04/22/2019   CO2 26 04/22/2019   BUN 12 04/22/2019   CREATININE 0.92 04/22/2019   GLUCOSE 162 (H) 04/22/2019   ALT 39 04/18/2019    No results found for: CHOL, HDL, LDLCALC, LDLDIRECT, TRIG, CHOLHDL   --------------------------------------------------------------------------------------------------  ASSESSMENT AND PLAN: Coronary artery disease with stable angina: Mr. Luana Shu reports a long history of chest pain, dating back at least 3 to 4 years.  Most recent  catheterization in 2019 at [facility] was notable for three-vessel coronary artery disease with patent LIMA to LAD and SVG to OM.  His pain is somewhat atypical and that it is not exertional and lasts less than a minute.  However, it seems to be responsive to nitroglycerin.  EKG today shows nonspecific T wave changes.  In light of subtle progression of his chest pain, I think it would be prudent to proceed with a pharmacologic myocardial perfusion stress test to evaluate for high risk ischemia, particularly with upcoming surgical intervention on the staghorn calculus.  In the meantime, I have recommended increasing isosorbide mononitrate to 60 mg daily and decreasing lisinopril to 20 mg daily in order to minimize the risk for hypotension.  Aspirin should be continued.  Aortic stenosis status post bioprosthetic aVR: Recent echocardiogram showed normal LVEF and bioprosthetic valve function.  There was no evidence of endocarditis in the setting of enterococcal bacteremia.  Continue aspirin and SBE prophylaxis.  Hypertension: Blood pressure normal today.  Given escalation of isosorbide mononitrate, we will decrease lisinopril to 20 mg daily.  Current doses of metoprolol and amlodipine should be continued.  Hyperlipidemia: Continue high intensity statin therapy for secondary prevention.  Preoperative cardiovascular risk assessment: Given intermittent chest pain, I recommend stress test, as outlined above.  If there is no high risk ischemia, I think it is reasonable for Mr. Odaniel to proceed with urologic surgery without further intervention.  If possible, aspirin 81 mg daily should be continued in the perioperative period.  Given recent enterococcal bacteremia and plan for intervention on staghorn calculus, prophylaxis for bacterial endocarditis immediately before urologic intervention is recommended.  Antimicrobial choice should include activity against Enterococcus.  Follow-up: Return to clinic in 6 weeks.   Nelva Bush, MD 05/13/2019 4:03 PM

## 2019-05-12 NOTE — Telephone Encounter (Signed)
° °  [facility] Pre-operative Risk Assessment    Request for surgical clearance:  1. What type of surgery is being performed? Right ureteroscopy, laser lithotripsy, ureteral stent placement    2. When is this surgery scheduled? 05/27/19   3. What type of clearance is required (medical clearance vs. Pharmacy clearance to hold med vs. Both)? Both, for CAD s/p CABG, h/o SVT, HTN, S/p aortic valve replacement   4. Are there any medications that need to be held prior to surgery and how long? May continue ASA 81 MG per Dr Diamantina Providence    5. Practice name and name of physician performing surgery? [place], Dr Nickolas Madrid   6. What is your office phone number [phone]   7.   What is your office fax number [phone]  8.   Anesthesia type (None, local, MAC, general) ? None listed   Ace Gins 05/12/2019, 4:39 PM  _________________________________________________________________   (provider comments below)

## 2019-05-12 NOTE — Telephone Encounter (Signed)
Patient was seen by Dr. Saunders Revel today, pending myoview prior to cardiac clearance

## 2019-05-12 NOTE — Patient Instructions (Signed)
Medication Instructions:  Your physician has recommended you make the following change in your medication:  1- INCREASE Imdur to 60 mg by mouth once a day. 2- DECREASE Lisinopril to 20 mg by mouth once a day.   *If you need a refill on your cardiac medications before your next appointment, please call your pharmacy*  Lab Work: NONE If you have labs (blood work) drawn today and your tests are completely normal, you will receive your results only by: Marland Kitchen MyChart Message (if you have MyChart) OR . A paper copy in the mail If you have any lab test that is abnormal or we need to change your treatment, we will call you to review the results.  Testing/Procedures: Your physician has requested that you have a lexiscan myoview. For further information please visit [email or web address]. Please follow instruction sheet, as given.  [place]  Your caregiver has ordered a Stress Test with nuclear imaging. The purpose of this test is to evaluate the blood supply to your heart muscle. This procedure is referred to as a "Non-Invasive Stress Test." This is because other than having an IV started in your vein, nothing is inserted or "invades" your body. Cardiac stress tests are done to find areas of poor blood flow to the heart by determining the extent of coronary artery disease (CAD). Some patients exercise on a treadmill, which naturally increases the blood flow to your heart, while others who are  unable to walk on a treadmill due to physical limitations have a pharmacologic/chemical stress agent called Lexiscan . This medicine will mimic walking on a treadmill by temporarily increasing your coronary blood flow.   Please note: these test may take anywhere between 2-4 hours to complete  PLEASE REPORT TO [facility] AT THE FIRST DESK WILL DIRECT YOU WHERE TO GO  Date of Procedure:_____________________________________  Arrival Time for Procedure:______________________________   Instructions regarding medication:   _yes_ : Hold diabetes medication morning of procedure (Jardiance, Glipizide and Insulin)   PLEASE NOTIFY THE OFFICE AT LEAST 24 HOURS IN ADVANCE IF YOU ARE UNABLE TO KEEP YOUR APPOINTMENT.  [phone] AND  PLEASE NOTIFY NUCLEAR MEDICINE AT [facility] AT LEAST 24 HOURS IN ADVANCE IF YOU ARE UNABLE TO KEEP YOUR APPOINTMENT. [phone]  How to prepare for your Myoview test:  1. Do not eat or drink after midnight 2. No caffeine for 24 hours prior to test 3. No smoking 24 hours prior to test. 4. Your medication may be taken with water.  If your doctor stopped a medication because of this test, do not take that medication. 5. Ladies, please do not wear dresses.  Skirts or pants are appropriate. Please wear a short sleeve shirt. 6. No perfume, cologne or lotion. 7. Wear comfortable walking shoes.  Follow-Up: At [facility], you and your health needs are our priority.  As part of our continuing mission to provide you with exceptional heart care, we have created designated Provider Care Teams.  These Care Teams include your primary Cardiologist (physician) and Advanced Practice Providers (APPs -  Physician Assistants and Nurse Practitioners) who all work together to provide you with the care you need, when you need it.  Your next appointment:   6 weeks with Gerald Stabs or Thurmond Butts.  The format for your next appointment:   In Person  Provider:    You may see one of the following Advanced Practice Providers on your designated Care Team:    Murray Hodgkins, NP  Christell Faith, PA-C  Marrianne Mood, PA-C    Cardiac Nuclear Scan A cardiac nuclear scan is a test that measures blood flow to the heart when a person is resting and when he or she is exercising. The test looks for problems such as:  Not enough blood reaching a portion of the heart.  The heart muscle not working normally. You may need this test if:  You have heart disease.  You have had  abnormal lab results.  You have had heart surgery or a balloon procedure to open up blocked arteries (angioplasty).  You have chest pain.  You have shortness of breath. In this test, a radioactive dye (tracer) is injected into your bloodstream. After the tracer has traveled to your heart, an imaging device is used to measure how much of the tracer is absorbed by or distributed to various areas of your heart. This procedure is usually done at a hospital and takes 2-4 hours. Tell a health care provider about:  Any allergies you have.  All medicines you are taking, including vitamins, herbs, eye drops, creams, and over-the-counter medicines.  Any problems you or family members have had with anesthetic medicines.  Any blood disorders you have.  Any surgeries you have had.  Any medical conditions you have.  Whether you are pregnant or may be pregnant. What are the risks? Generally, this is a safe procedure. However, problems may occur, including:  Serious chest pain and heart attack. This is only a risk if the stress portion of the test is done.  Rapid heartbeat.  Sensation of warmth in your chest. This usually passes quickly.  Allergic reaction to the tracer. What happens before the procedure?  Ask your health care provider about changing or stopping your regular medicines. This is especially important if you are taking diabetes medicines or blood thinners.  Follow instructions from your health care provider about eating or drinking restrictions.  Remove your jewelry on the day of the procedure. What happens during the procedure?  An IV will be inserted into one of your veins.  Your health care provider will inject a small amount of radioactive tracer through the IV.  You will wait for 20-40 minutes while the tracer travels through your bloodstream.  Your heart activity will be monitored with an electrocardiogram (ECG).  You will lie down on an exam table.  Images of  your heart will be taken for about 15-20 minutes.  You may also have a stress test. For this test, one of the following may be done: ? You will exercise on a treadmill or stationary bike. While you exercise, your heart's activity will be monitored with an ECG, and your blood pressure will be checked. ? You will be given medicines that will increase blood flow to parts of your heart. This is done if you are unable to exercise.  When blood flow to your heart has peaked, a tracer will again be injected through the IV.  After 20-40 minutes, you will get back on the exam table and have more images taken of your heart.  Depending on the type of tracer used, scans may need to be repeated 3-4 hours later.  Your IV line will be removed when the procedure is over. The procedure may vary among health care providers and hospitals. What happens after the procedure?  Unless your health care provider tells you otherwise, you may return to your normal schedule, including diet, activities, and medicines.  Unless your health care provider tells you otherwise, you may  increase your fluid intake. This will help to flush the contrast dye from your body. Drink enough fluid to keep your urine pale yellow.  Ask your health care provider, or the department that is doing the test: ? When will my results be ready? ? How will I get my results? Summary  A cardiac nuclear scan measures the blood flow to the heart when a person is resting and when he or she is exercising.  Tell your health care provider if you are pregnant.  Before the procedure, ask your health care provider about changing or stopping your regular medicines. This is especially important if you are taking diabetes medicines or blood thinners.  After the procedure, unless your health care provider tells you otherwise, increase your fluid intake. This will help flush the contrast dye from your body.  After the procedure, unless your health care  provider tells you otherwise, you may return to your normal schedule, including diet, activities, and medicines. This information is not intended to replace advice given to you by your health care provider. Make sure you discuss any questions you have with your health care provider. Document Released: 07/25/2004 Document Revised: 12/14/2017 Document Reviewed: 12/14/2017 Elsevier Patient Education  2020 [employer].

## 2019-05-12 NOTE — Progress Notes (Signed)
   05/12/2019 4:04 PM   Jonathan Cross 1946/02/15 [id number]  Reason for visit: Right staghorn stone  HPI: I saw [name] in urology clinic today to discuss treatment options for his right staghorn stone.  He was previously a patient of Dr. Bernardo Heater.  He is a very comorbid 73 year old male with past medical history notable for diabetes, CAD status post CABG, and history of aortic valve replacement.  He has some baseline shortness of breath and takes nitrates for chest pain.  He was admitted in early October to [facility] with Enterococcus bacteremia and was found to have a 3 cm right staghorn stone with some mild hydronephrosis.  He was seen by Dr. Bernardo Heater at that time and was treated with antibiotics alone.  He has improved with a 2-week course of antibiotics.  He denies any fevers or dysuria.  He has some dull intermittent right-sided flank pain.  He has previously undergone ureteroscopy by Dr. Bernardo Heater at [facility] for a right-sided 6 mm stone a few years ago.  I personally reviewed his CT scan, which is notable for a large 3 to 4 cm right staghorn stone with some mild hydronephrosis and stranding.  There is a left parapelvic cyst, but no left hydronephrosis or left nephrolithiasis. 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  We discussed treatment options including percutaneous nephrolithotomy versus staged ureteroscopy, laser lithotripsy, and stent placement.  We discussed the risks and benefits at length of both of these procedures.  We discussed the advantage of not having to stop any anticoagulation or aspirin for ureteroscopy compared to PCNL.  His stone is not particularly hard on CT, and is appears consistent with uric acid.  We discussed that although a  PCNL would be ideal with his stone size, his comorbidities put him at high risk for complications, and with a soft stone I think staged ureteroscopy is feasible.  They are not willing to accept the risks of bleeding, infection, or pleural injury with PCNL, and elected for staged ureteroscopy.  -Schedule right ureteroscopy, laser lithotripsy, stent placement, likely will need follow-up staged procedure secondary to stone burden -Will need cardiology clearance prior -Okay to continue aspirin  A total of 25 minutes were spent face-to-face with the patient, greater than 50% was spent in patient education, counseling, and coordination of care regarding right staghorn calculus in the setting of numerous comorbidities.  Billey Co, [facility] [address], [place], [place] [address] [phone]

## 2019-05-13 ENCOUNTER — Other Ambulatory Visit: Payer: Self-pay | Admitting: Radiology

## 2019-05-13 ENCOUNTER — Telehealth: Payer: Self-pay | Admitting: Internal Medicine

## 2019-05-13 ENCOUNTER — Encounter: Payer: Self-pay | Admitting: Internal Medicine

## 2019-05-13 DIAGNOSIS — Z01810 Encounter for preprocedural cardiovascular examination: Secondary | ICD-10-CM | POA: Insufficient documentation

## 2019-05-13 DIAGNOSIS — E785 Hyperlipidemia, unspecified: Secondary | ICD-10-CM | POA: Insufficient documentation

## 2019-05-13 DIAGNOSIS — I350 Nonrheumatic aortic (valve) stenosis: Secondary | ICD-10-CM | POA: Insufficient documentation

## 2019-05-13 NOTE — Telephone Encounter (Signed)
lmov to schedule lexi and 6 week fu per checkout 05/12/19 [facility]

## 2019-05-16 ENCOUNTER — Other Ambulatory Visit: Payer: Self-pay | Admitting: Urology

## 2019-05-16 ENCOUNTER — Telehealth: Payer: Self-pay | Admitting: Radiology

## 2019-05-16 MED ORDER — OXYCODONE-ACETAMINOPHEN 7.5-325 MG PO TABS: 1.0000 | ORAL_TABLET | ORAL | 0 refills | Status: DC | PRN

## 2019-05-16 MED ORDER — OXYCODONE-ACETAMINOPHEN 7.5-325 MG PO TABS: 1.0000 | ORAL_TABLET | ORAL | 0 refills | Status: AC | PRN

## 2019-05-16 NOTE — Addendum Note (Signed)
Addended by: Billey Co on: 05/16/2019 12:52 PM   Modules accepted: Orders

## 2019-05-16 NOTE — Telephone Encounter (Signed)
Patient's son called to request a refill of percocet to be sent to [facility] on [address]. Patient is out of medication.

## 2019-05-16 NOTE — Telephone Encounter (Signed)
Thanks, percocet refill sent to [facility]  Nickolas Madrid, MD 05/16/2019

## 2019-05-17 ENCOUNTER — Other Ambulatory Visit: Payer: Medicare HMO

## 2019-05-18 ENCOUNTER — Other Ambulatory Visit: Payer: Self-pay

## 2019-05-18 ENCOUNTER — Encounter
Admission: RE | Admit: 2019-05-18 | Discharge: 2019-05-18 | Disposition: A | Discharge status: Home / Self Care | Payer: Medicare HMO | Source: Ambulatory Visit | Attending: Internal Medicine | Admitting: Internal Medicine

## 2019-05-18 DIAGNOSIS — I25118 Atherosclerotic heart disease of native coronary artery with other forms of angina pectoris: Secondary | ICD-10-CM | POA: Diagnosis not present

## 2019-05-18 DIAGNOSIS — I350 Nonrheumatic aortic (valve) stenosis: Secondary | ICD-10-CM | POA: Diagnosis not present

## 2019-05-18 LAB — NM MYOCAR MULTI W/SPECT W/WALL MOTION / EF
Estimated workload: 1 METS
Exercise duration (min): 0 min
Exercise duration (sec): 0 s
LV dias vol: 74 mL (ref 62–150)
LV sys vol: 26 mL
MPHR: 145 {beats}/min
Peak HR: 113 {beats}/min
Percent HR: 77 %
Rest HR: 83 {beats}/min
SDS: 1
SRS: 0
SSS: 2
TID: 1.33

## 2019-05-18 MED ORDER — TECHNETIUM TC 99M TETROFOSMIN IV KIT
32.1800 | PACK | Freq: Once | INTRAVENOUS | Status: AC | PRN
  Administered 2019-05-18: 32.18 via INTRAVENOUS

## 2019-05-18 MED ORDER — REGADENOSON 0.4 MG/5ML IV SOLN
0.4000 mg | Freq: Once | INTRAVENOUS | Status: AC
  Administered 2019-05-18: 0.4 mg via INTRAVENOUS

## 2019-05-18 MED ORDER — TECHNETIUM TC 99M TETROFOSMIN IV KIT
10.2960 | PACK | Freq: Once | INTRAVENOUS | Status: AC | PRN
  Administered 2019-05-18: 10.296 via INTRAVENOUS

## 2019-05-19 ENCOUNTER — Other Ambulatory Visit: Payer: Medicare HMO

## 2019-05-19 ENCOUNTER — Encounter
Admission: RE | Admit: 2019-05-19 | Discharge: 2019-05-19 | Disposition: A | Discharge status: Home / Self Care | Payer: Medicare HMO | Source: Ambulatory Visit | Attending: Urology | Admitting: Urology

## 2019-05-19 DIAGNOSIS — Z01812 Encounter for preprocedural laboratory examination: Secondary | ICD-10-CM | POA: Insufficient documentation

## 2019-05-19 HISTORY — DX: Personal history of urinary calculi: Z87.442

## 2019-05-19 HISTORY — DX: Sleep apnea, unspecified: G47.30

## 2019-05-19 LAB — URINALYSIS, ROUTINE W REFLEX MICROSCOPIC
Bilirubin Urine: NEGATIVE
Glucose, UA: 50 mg/dL — AB
Ketones, ur: NEGATIVE mg/dL
Nitrite: NEGATIVE
Protein, ur: 100 mg/dL — AB
RBC / HPF: >50 RBC/hpf — ABNORMAL HIGH (ref 0–5)
Specific Gravity, Urine: 1.014 (ref 1.005–1.030)
Tyrosine Crys, UA: POSITIVE
WBC, UA: >50 WBC/hpf — ABNORMAL HIGH (ref 0–5)
pH: 5 (ref 5.0–8.0)

## 2019-05-19 LAB — CBC
HCT: 36.8 % — ABNORMAL LOW (ref 39.0–52.0)
Hemoglobin: 12.1 g/dL — ABNORMAL LOW (ref 13.0–17.0)
MCH: 34.8 pg — ABNORMAL HIGH (ref 26.0–34.0)
MCHC: 32.9 g/dL (ref 30.0–36.0)
MCV: 105.7 fL — ABNORMAL HIGH (ref 80.0–100.0)
Platelets: 165 10*3/uL (ref 150–400)
RBC: 3.48 MIL/uL — ABNORMAL LOW (ref 4.22–5.81)
RDW: 15.5 % (ref 11.5–15.5)
WBC: 4.3 10*3/uL (ref 4.0–10.5)
nRBC: 0 % (ref 0.0–0.2)

## 2019-05-19 NOTE — Patient Instructions (Signed)
Your procedure is scheduled on: Friday 05/27/19.  Report to DAY SURGERY DEPARTMENT LOCATED ON 2ND FLOOR MEDICAL MALL ENTRANCE. To find out your arrival time please call [phone] between 1PM - 3PM on Thursday 05/26/19.    Remember: Instructions that are not followed completely may result in serious medical risk, up to and including death, or upon the discretion of your surgeon and anesthesiologist your surgery may need to be rescheduled.      _X__ 1. Do not eat food after midnight the night before your procedure.                 No gum chewing or hard candies. You may drink SUGAR FREE clear liquids up to 2 hours                 before you are scheduled to arrive for your surgery- DO NOT drink clear                 liquids within 2 hours of the start of your surgery.                    __X__2.  On the morning of surgery brush your teeth with toothpaste and water, you may rinse your mouth with mouthwash if you wish.  Do not swallow any toothpaste or mouthwash.      __X__3.  Notify your doctor if there is any change in your medical condition      (cold, fever, infections).       Do not wear jewelry, make-up, hairpins, clips or nail polish. Do not wear lotions, powders, or perfumes.  Do not shave 48 hours prior to surgery. Men may shave face and neck. Do not bring valuables to the hospital.      [facility] is not responsible for any belongings or valuables.    Contacts, dentures/partials or body piercings may not be worn into surgery. Bring a case for your contacts, glasses or hearing aids, a denture cup will be supplied.    Patients discharged the day of surgery will not be allowed to drive home.     __X__ Take these medicines the morning of surgery with A SIP OF WATER:     1. amLODipine (NORVASC) 5 MG tablet  2. isosorbide mononitrate (IMDUR) 60 MG 24 hr tablet  3. omeprazole (PRILOSEC) 20 MG capsule  4. oxyCODONE-acetaminophen (PERCOCET) 7.5-325 MG tablet if  needed  5. loratadine (CLARITIN) 10 MG tablet if needed     __X__ Take 1/2 of usual insulin dose the night before surgery. No insulin the morning          of surgery.    __X__ Continue taking Aspirin daily according to Dr. Darnelle Bos instructions.    __X__ Stop Anti-inflammatories 7 days before surgery such as Advil, Ibuprofen, Motrin, BC or Goodies Powder, Naprosyn, Naproxen, Aleve, Aspirin, Meloxicam. May take Tylenol or Percocet if needed for pain or discomfort.      __X__ Don't start taking any new herbal supplements before your surgery.

## 2019-05-20 ENCOUNTER — Other Ambulatory Visit: Payer: Medicare HMO

## 2019-05-21 ENCOUNTER — Other Ambulatory Visit: Payer: Self-pay | Admitting: Urology

## 2019-05-21 LAB — URINE CULTURE: Culture: >=100000 — AB

## 2019-05-21 MED ORDER — LEVOFLOXACIN 500 MG PO TABS: 500.0000 mg | ORAL_TABLET | Freq: Every day | ORAL | 0 refills | Status: DC

## 2019-05-21 NOTE — Progress Notes (Signed)
Urology Telephone Note  Mr. Jonathan Cross is a co-morbid 73 year old male with a 3 cm right staghorn stone.  After long discussion of the risks and benefits, he elected for staged ureteroscopy instead of PCNL with his co-morbidities.  He denies any fevers, chills, dysuria, or malaise.  Urine culture shows > 100K Enterococcus.  I sent in culture appropriate Levaquin 500 mg daily to start today to sterilize the urine in anticipation of surgery next week.  We discussed urgent return precautions that would potentially require temporary drainage with stent or nephrostomy tube including fevers, chills, severe flank pain.  Nickolas Madrid, MD 05/21/2019

## 2019-05-23 NOTE — Telephone Encounter (Signed)
   Primary Cardiologist: Nelva Bush, MD  Chart reviewed as part of pre-operative protocol coverage. Given past medical history and time since last visit, based on ACC/AHA guidelines, Jonathan Cross. would be at acceptable risk for the planned procedure without further cardiovascular testing.   Pt seen by Dr. Saunders Revel 05/12/2019. "Given intermittent chest pain, I recommend stress test, as outlined above.  If there is no high risk ischemia, I think it is reasonable for [name] to proceed with urologic surgery without further intervention.  If possible, aspirin 81 mg daily should be continued in the perioperative period.  Given recent enterococcal bacteremia and plan for intervention on staghorn calculus, prophylaxis for bacterial endocarditis immediately before urologic intervention is recommended.  Antimicrobial choice should include activity against Enterococcus."  Pt underwent stress testing 05/18/19 which was normal with no evidence of ischemia or infarct. LVEF was >65%.   I will route this recommendation to the requesting party via Epic fax function and remove from pre-op pool.  Please call with questions.  Kathyrn Drown, NP 05/23/2019, 10:50 AM

## 2019-05-24 ENCOUNTER — Other Ambulatory Visit
Admission: RE | Admit: 2019-05-24 | Discharge: 2019-05-24 | Disposition: A | Discharge status: Home / Self Care | Payer: Medicare HMO | Source: Ambulatory Visit | Attending: Urology | Admitting: Urology

## 2019-05-24 DIAGNOSIS — Z20828 Contact with and (suspected) exposure to other viral communicable diseases: Secondary | ICD-10-CM | POA: Insufficient documentation

## 2019-05-24 DIAGNOSIS — Z01812 Encounter for preprocedural laboratory examination: Secondary | ICD-10-CM | POA: Insufficient documentation

## 2019-05-24 LAB — SARS CORONAVIRUS 2 (TAT 6-24 HRS): SARS Coronavirus 2: NEGATIVE

## 2019-05-26 MED ORDER — LEVOFLOXACIN IN D5W 500 MG/100ML IV SOLN
500.0000 mg | INTRAVENOUS | Status: AC
  Administered 2019-05-27: 500 mg via INTRAVENOUS

## 2019-05-27 ENCOUNTER — Ambulatory Visit: Payer: Medicare HMO | Admitting: Certified Registered Nurse Anesthetist

## 2019-05-27 ENCOUNTER — Encounter
Admission: RE | Disposition: A | Discharge status: Home / Self Care | Payer: Self-pay | Source: Ambulatory Visit | Attending: Urology

## 2019-05-27 ENCOUNTER — Ambulatory Visit: Payer: Medicare HMO

## 2019-05-27 ENCOUNTER — Other Ambulatory Visit: Payer: Self-pay

## 2019-05-27 ENCOUNTER — Encounter: Payer: Self-pay | Admitting: Certified Registered Nurse Anesthetist

## 2019-05-27 ENCOUNTER — Ambulatory Visit
Admission: RE | Admit: 2019-05-27 | Discharge: 2019-05-27 | Disposition: A | Discharge status: Home / Self Care | Payer: Medicare HMO | Source: Ambulatory Visit | Attending: Urology | Admitting: Urology

## 2019-05-27 DIAGNOSIS — Z87442 Personal history of urinary calculi: Secondary | ICD-10-CM | POA: Diagnosis not present

## 2019-05-27 DIAGNOSIS — N200 Calculus of kidney: Secondary | ICD-10-CM

## 2019-05-27 DIAGNOSIS — E119 Type 2 diabetes mellitus without complications: Secondary | ICD-10-CM | POA: Insufficient documentation

## 2019-05-27 DIAGNOSIS — G4730 Sleep apnea, unspecified: Secondary | ICD-10-CM | POA: Diagnosis not present

## 2019-05-27 DIAGNOSIS — I10 Essential (primary) hypertension: Secondary | ICD-10-CM | POA: Insufficient documentation

## 2019-05-27 DIAGNOSIS — I2510 Atherosclerotic heart disease of native coronary artery without angina pectoris: Secondary | ICD-10-CM | POA: Insufficient documentation

## 2019-05-27 DIAGNOSIS — Z951 Presence of aortocoronary bypass graft: Secondary | ICD-10-CM | POA: Insufficient documentation

## 2019-05-27 HISTORY — PX: CYSTOSCOPY/URETEROSCOPY/HOLMIUM LASER/STENT PLACEMENT: SHX6546

## 2019-05-27 HISTORY — PX: CYSTOSCOPY W/ RETROGRADES: SHX1426

## 2019-05-27 LAB — GLUCOSE, CAPILLARY
Glucose-Capillary: 163 mg/dL — ABNORMAL HIGH (ref 70–99)
Glucose-Capillary: 163 mg/dL — ABNORMAL HIGH (ref 70–99)

## 2019-05-27 SURGERY — CYSTOSCOPY/URETEROSCOPY/HOLMIUM LASER/STENT PLACEMENT
Anesthesia: General | Site: Ureter | Laterality: Right

## 2019-05-27 MED ORDER — IOHEXOL 180 MG/ML  SOLN
INTRAMUSCULAR | Status: DC | PRN
  Administered 2019-05-27: 20 mL

## 2019-05-27 MED ORDER — NITROFURANTOIN MACROCRYSTAL 100 MG PO CAPS: 100.0000 mg | ORAL_CAPSULE | Freq: Every day | ORAL | 0 refills | Status: DC

## 2019-05-27 MED ORDER — ROCURONIUM BROMIDE 50 MG/5ML IV SOLN
INTRAVENOUS | Status: AC
  Filled 2019-05-27: qty 1

## 2019-05-27 MED ORDER — TAMSULOSIN HCL 0.4 MG PO CAPS: 0.4000 mg | ORAL_CAPSULE | Freq: Every day | ORAL | 0 refills | Status: DC

## 2019-05-27 MED ORDER — FENTANYL CITRATE (PF) 250 MCG/5ML IJ SOLN
INTRAMUSCULAR | Status: DC | PRN
  Administered 2019-05-27 (×4): 50 ug via INTRAVENOUS

## 2019-05-27 MED ORDER — EPHEDRINE SULFATE 50 MG/ML IJ SOLN
INTRAMUSCULAR | Status: DC | PRN
  Administered 2019-05-27: 10 mg via INTRAVENOUS
  Administered 2019-05-27: 5 mg via INTRAVENOUS
  Administered 2019-05-27 (×2): 10 mg via INTRAVENOUS

## 2019-05-27 MED ORDER — PHENYLEPHRINE HCL (PRESSORS) 10 MG/ML IV SOLN
INTRAVENOUS | Status: DC | PRN
  Administered 2019-05-27: 100 ug via INTRAVENOUS
  Administered 2019-05-27: 50 ug via INTRAVENOUS
  Administered 2019-05-27: 150 ug via INTRAVENOUS
  Administered 2019-05-27 (×2): 100 ug via INTRAVENOUS

## 2019-05-27 MED ORDER — OXYCODONE-ACETAMINOPHEN 5-325 MG PO TABS: 1.0000 | ORAL_TABLET | Freq: Four times a day (QID) | ORAL | 0 refills | Status: AC | PRN

## 2019-05-27 MED ORDER — POTASSIUM CITRATE ER 15 MEQ (1620 MG) PO TBCR: 2.0000 | EXTENDED_RELEASE_TABLET | Freq: Two times a day (BID) | ORAL | 6 refills | Status: DC

## 2019-05-27 MED ORDER — LIDOCAINE HCL (CARDIAC) PF 100 MG/5ML IV SOSY
PREFILLED_SYRINGE | INTRAVENOUS | Status: DC | PRN
  Administered 2019-05-27: 100 mg via INTRATRACHEAL

## 2019-05-27 MED ORDER — SUCCINYLCHOLINE CHLORIDE 20 MG/ML IJ SOLN
INTRAMUSCULAR | Status: AC
  Filled 2019-05-27: qty 1

## 2019-05-27 MED ORDER — SODIUM CHLORIDE 0.9 % IV SOLN
INTRAVENOUS | Status: DC
  Administered 2019-05-27 (×2): via INTRAVENOUS

## 2019-05-27 MED ORDER — LIDOCAINE HCL 4 % MT SOLN
OROMUCOSAL | Status: DC | PRN
  Administered 2019-05-27: 4 mL via TOPICAL

## 2019-05-27 MED ORDER — ONDANSETRON HCL 4 MG/2ML IJ SOLN
INTRAMUSCULAR | Status: AC
  Filled 2019-05-27: qty 2

## 2019-05-27 MED ORDER — PROPOFOL 10 MG/ML IV BOLUS
INTRAVENOUS | Status: AC
  Filled 2019-05-27: qty 20

## 2019-05-27 MED ORDER — SUGAMMADEX SODIUM 200 MG/2ML IV SOLN
INTRAVENOUS | Status: AC
  Filled 2019-05-27: qty 2

## 2019-05-27 MED ORDER — FAMOTIDINE 20 MG PO TABS
ORAL_TABLET | ORAL | Status: AC
  Filled 2019-05-27: qty 1

## 2019-05-27 MED ORDER — SUGAMMADEX SODIUM 500 MG/5ML IV SOLN
INTRAVENOUS | Status: AC
  Filled 2019-05-27: qty 5

## 2019-05-27 MED ORDER — FENTANYL CITRATE (PF) 100 MCG/2ML IJ SOLN: 25.0000 ug | INTRAMUSCULAR | Status: DC | PRN

## 2019-05-27 MED ORDER — PROMETHAZINE HCL 25 MG/ML IJ SOLN: 6.2500 mg | INTRAMUSCULAR | Status: DC | PRN

## 2019-05-27 MED ORDER — SUGAMMADEX SODIUM 500 MG/5ML IV SOLN
INTRAVENOUS | Status: DC | PRN
  Administered 2019-05-27: 450 mg via INTRAVENOUS

## 2019-05-27 MED ORDER — LEVOFLOXACIN IN D5W 500 MG/100ML IV SOLN
INTRAVENOUS | Status: AC
  Filled 2019-05-27: qty 100

## 2019-05-27 MED ORDER — FENTANYL CITRATE (PF) 250 MCG/5ML IJ SOLN
INTRAMUSCULAR | Status: AC
  Filled 2019-05-27: qty 5

## 2019-05-27 MED ORDER — PROPOFOL 10 MG/ML IV BOLUS
INTRAVENOUS | Status: DC | PRN
  Administered 2019-05-27: 150 mg via INTRAVENOUS

## 2019-05-27 MED ORDER — ROCURONIUM BROMIDE 100 MG/10ML IV SOLN
INTRAVENOUS | Status: DC | PRN
  Administered 2019-05-27: 50 mg via INTRAVENOUS
  Administered 2019-05-27 (×2): 10 mg via INTRAVENOUS

## 2019-05-27 MED ORDER — LIDOCAINE HCL (PF) 2 % IJ SOLN
INTRAMUSCULAR | Status: AC
  Filled 2019-05-27: qty 10

## 2019-05-27 MED ORDER — ONDANSETRON HCL 4 MG/2ML IJ SOLN
INTRAMUSCULAR | Status: DC | PRN
  Administered 2019-05-27: 4 mg via INTRAVENOUS

## 2019-05-27 MED ORDER — DEXAMETHASONE SODIUM PHOSPHATE 10 MG/ML IJ SOLN
INTRAMUSCULAR | Status: AC
  Filled 2019-05-27: qty 1

## 2019-05-27 MED ORDER — FAMOTIDINE 20 MG PO TABS
20.0000 mg | ORAL_TABLET | Freq: Once | ORAL | Status: AC
  Administered 2019-05-27: 09:00:00 20 mg via ORAL

## 2019-05-27 MED ORDER — DEXAMETHASONE SODIUM PHOSPHATE 10 MG/ML IJ SOLN
INTRAMUSCULAR | Status: DC | PRN
  Administered 2019-05-27: 10 mg via INTRAVENOUS

## 2019-05-27 SURGICAL SUPPLY — 31 items
BAG DRAIN CYSTO-URO LG1000N (MISCELLANEOUS) ×3 IMPLANT
BRUSH SCRUB EZ 1% IODOPHOR (MISCELLANEOUS) ×3 IMPLANT
CATH FOL 2WAY LX 18X30 (CATHETERS) ×3 IMPLANT
CATH URETL 5X70 OPEN END (CATHETERS) ×3 IMPLANT
CNTNR SPEC 2.5X3XGRAD LEK (MISCELLANEOUS)
CONT SPEC 4OZ STER OR WHT (MISCELLANEOUS)
CONTAINER SPEC 2.5X3XGRAD LEK (MISCELLANEOUS) IMPLANT
DRAPE UTILITY 15X26 TOWEL STRL (DRAPES) ×3 IMPLANT
FIBER LASER TRAC TIP (UROLOGICAL SUPPLIES) ×3 IMPLANT
GLOVE BIOGEL PI IND STRL 7.5 (GLOVE) ×1 IMPLANT
GLOVE BIOGEL PI INDICATOR 7.5 (GLOVE) ×2
GOWN STRL REUS W/ TWL LRG LVL3 (GOWN DISPOSABLE) ×1 IMPLANT
GOWN STRL REUS W/ TWL XL LVL3 (GOWN DISPOSABLE) ×1 IMPLANT
GOWN STRL REUS W/TWL LRG LVL3 (GOWN DISPOSABLE) ×2
GOWN STRL REUS W/TWL XL LVL3 (GOWN DISPOSABLE) ×2
GUIDEWIRE STR DUAL SENSOR (WIRE) ×6 IMPLANT
HOLDER FOLEY CATH W/STRAP (MISCELLANEOUS) ×3 IMPLANT
INFUSOR MANOMETER BAG 3000ML (MISCELLANEOUS) ×3 IMPLANT
INTRODUCER DILATOR DOUBLE (INTRODUCER) ×3 IMPLANT
KIT TURNOVER CYSTO (KITS) ×3 IMPLANT
PACK CYSTO AR (MISCELLANEOUS) ×3 IMPLANT
SET CYSTO W/LG BORE CLAMP LF (SET/KITS/TRAYS/PACK) ×3 IMPLANT
SHEATH URETERAL 12FRX35CM (MISCELLANEOUS) ×3 IMPLANT
SOL .9 NS 3000ML IRR  AL (IV SOLUTION) ×2
SOL .9 NS 3000ML IRR UROMATIC (IV SOLUTION) ×1 IMPLANT
STENT URET 6FRX24 CONTOUR (STENTS) IMPLANT
STENT URET 6FRX26 CONTOUR (STENTS) ×3 IMPLANT
SURGILUBE 2OZ TUBE FLIPTOP (MISCELLANEOUS) ×3 IMPLANT
SYR 10ML LL (SYRINGE) ×3 IMPLANT
VALVE UROSEAL ADJ ENDO (VALVE) ×3 IMPLANT
WATER STERILE IRR 1000ML POUR (IV SOLUTION) ×3 IMPLANT

## 2019-05-27 NOTE — Anesthesia Post-op Follow-up Note (Signed)
Anesthesia QCDR form completed.        

## 2019-05-27 NOTE — H&P (Addendum)
UROLOGY H&P UPDATE  Agree with prior H&P dated 10/29.  Right partial staghorn stone here for staged ureteroscopy.  We discussed the risks and benefits of PCNL, and he refused.  He denies fevers or chills, dysuria.  Cardiac: RRR Lungs: CTA bilaterally  Laterality: Right Procedure: Ureteroscopy, laser lithotripsy, stent placement  Urine: Culture 11/5 with Enterococcus.  Has been on culture appropriate Levaquin.  We specifically discussed the risks ureteroscopy including bleeding, infection/sepsis, stent related symptoms including flank pain/urgency/frequency/incontinence/dysuria, ureteral injury, inability to access stone, and need for staged or additional procedures.  We discussed at length the need for staged procedure, and possible temporary stent placement today if there is any signs of purulent urine or infection.  We discussed the high risk of UTI and sepsis with his prior Enterococcus infections despite adequate antibiotic treatment.   Billey Co, MD 05/27/2019

## 2019-05-27 NOTE — Op Note (Signed)
Date of procedure: 05/27/19  Preoperative diagnosis:  1. Right partial staghorn stone  Postoperative diagnosis:  1. Same  Procedure: 1. Cystoscopy, right ureteroscopy, right retrograde pyelogram with intraoperative interpretation, laser lithotripsy, right ureteral stent placement  Surgeon: Nickolas Madrid, MD  Anesthesia: General  Complications: None  Intraoperative findings:  1.  Normal cystoscopy 2.  Approximately 50 to 60% of staghorn stone dusted 3.  Uncomplicated ureteral stent placement  EBL: Minimal  Specimens: None  Drains: Right 6 French by 26 cm ureteral stent, 18 French Foley  Indication: Jonathan Cross. is a 73 y.o. patient with a partial right staghorn stone who elected for staged ureteroscopy for management secondary to his comorbidities.  After reviewing the management options for treatment, they elected to proceed with the above surgical procedure(s). We have discussed the potential benefits and risks of the procedure, side effects of the proposed treatment, the likelihood of the patient achieving the goals of the procedure, and any potential problems that might occur during the procedure or recuperation. Informed consent has been obtained.  Description of procedure:  The patient was taken to the operating room and general anesthesia was induced. SCDs were placed for DVT prophylaxis. The patient was placed in the dorsal lithotomy position, prepped and draped in the usual sterile fashion, and preoperative antibiotics(Levaquin) were administered. A preoperative time-out was performed.   A 21 French rigid cystoscope was used to intubate the urethra and a normal-appearing urethra was followed proximally to the bladder.  The prostate was moderate in size.  Thorough cystoscopy demonstrated no abnormal findings, and the ureteral orifices were orthotopic bilaterally.  Both ureters were effluxing clear yellow urine.  The stone was faintly visible on fluoroscopy.  A  retrograde pyelogram was performed and showed no hydronephrosis but the filling defect consistent with the known large renal stone.  A sensor wire was advanced up into the kidney under fluoroscopic vision, and a dual-lumen access catheter was used to gently dilate the distal and mid ureter and add a second safety wire.  12/14 French access sheath was then advanced over the wire under fluoroscopic vision up to the proximal ureter.  A single-channel flexible ureteroscope was advanced up into the kidney and thorough pyeloscopy revealed a very large yellow renal pelvis stone that appeared to be uric acid.  Using a 200 m laser fiber on settings of 0.5 J and 20 Hz the stone was methodically dusted.  After 60 minutes of fragmentation, vision was impaired, and with his history of infections I felt it was safest to perform a staged procedure to treat the remainder of his stone.  Based on direct examination and fluoroscopy, I felt over 50% of the stone had been treated.  A 6 French by 26 cm stent was uneventfully placed under direct vision with an excellent curl in the upper pole, as well as under direct vision the bladder.   A Foley was placed to maximize drainage in the postop period.  Disposition: Stable to PACU  Plan: Nitrofurantoin prophylaxis while stent in place Remove Foley prior to discharge Start potassium citrate Follow-up for second stage ureteroscopy in 2 to 3 weeks  Nickolas Madrid, MD

## 2019-05-27 NOTE — Anesthesia Procedure Notes (Signed)
Procedure Name: Intubation Date/Time: 05/27/2019 11:47 AM Performed by: Eben Burow, CRNA Pre-anesthesia Checklist: Patient identified, Emergency Drugs available, Suction available and Patient being monitored Patient Re-evaluated:Patient Re-evaluated prior to induction Oxygen Delivery Method: Circle system utilized Preoxygenation: Pre-oxygenation with 100% oxygen Induction Type: IV induction Ventilation: Mask ventilation without difficulty and Oral airway inserted - appropriate to patient size Laryngoscope Size: McGraph and 4 Grade View: Grade I Tube type: Oral Tube size: 8.0 mm Number of attempts: 1 Airway Equipment and Method: Stylet,  Oral airway,  Video-laryngoscopy and LTA kit utilized Placement Confirmation: positive ETCO2 and breath sounds checked- equal and bilateral Secured at: 23 cm Tube secured with: Tape Dental Injury: Teeth and Oropharynx as per pre-operative assessment

## 2019-05-27 NOTE — Anesthesia Postprocedure Evaluation (Signed)
Anesthesia Post Note  Patient: Waqas Largent.  Procedure(s) Performed: CYSTOSCOPY/URETEROSCOPY/HOLMIUM LASER/STENT PLACEMENT (Right Ureter) CYSTOSCOPY WITH RETROGRADE PYELOGRAM (Right Ureter)  Patient location during evaluation: PACU Anesthesia Type: General Level of consciousness: awake and alert Pain management: pain level controlled Vital Signs Assessment: post-procedure vital signs reviewed and stable Respiratory status: spontaneous breathing, nonlabored ventilation, respiratory function stable and patient connected to nasal cannula oxygen Cardiovascular status: blood pressure returned to baseline and stable Postop Assessment: no apparent nausea or vomiting Anesthetic complications: no     Last Vitals:  Vitals:   05/27/19 1438 05/27/19 1453  BP: (!) 141/75 132/70  Pulse: 94 91  Resp: 18 15  Temp:  (!) 36.3 C  SpO2: 94% 94%    Last Pain:  Vitals:   05/27/19 1453  TempSrc:   PainSc: 0-No pain                 Martha Clan

## 2019-05-27 NOTE — Anesthesia Preprocedure Evaluation (Signed)
Anesthesia Evaluation  Patient identified by MRN, date of birth, ID band Patient awake    Reviewed: Allergy & Precautions, H&P , NPO status , Patient's Chart, lab work & pertinent test results, reviewed documented beta blocker date and time   History of Anesthesia Complications Negative for: history of anesthetic complications  Airway Mallampati: II  TM Distance: >3 FB Neck ROM: full    Dental  (+) Dental Advidsory Given, Upper Dentures, Lower Dentures, Edentulous Upper, Edentulous Lower   Pulmonary neg shortness of breath, sleep apnea , neg COPD, neg recent URI, former smoker,    Pulmonary exam normal        Cardiovascular Exercise Tolerance: Good hypertension, (-) angina+ CAD and + CABG  (-) Past MI and (-) Cardiac Stents Normal cardiovascular exam(-) dysrhythmias + Valvular Problems/Murmurs (sp AVR) AS      Neuro/Psych negative neurological ROS  negative psych ROS   GI/Hepatic negative GI ROS, Neg liver ROS,   Endo/Other  diabetes  Renal/GU negative Renal ROS  negative genitourinary   Musculoskeletal   Abdominal   Peds  Hematology negative hematology ROS (+)   Anesthesia Other Findings Past Medical History: No date: Coronary artery disease No date: Diabetes ([place]) No date: H/O aortic valve replacement No date: History of kidney stones 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: Hx of CABG No date: Hyperlipidemia No date: Hypertension No date: Sleep apnea   Reproductive/Obstetrics negative OB ROS                             Anesthesia Physical Anesthesia Plan  ASA: III  Anesthesia Plan: General   Post-op Pain Management:    Induction: Intravenous  PONV Risk Score and Plan: 2 and Ondansetron, Dexamethasone and Treatment may vary due to age or medical condition  Airway Management Planned: Oral ETT  Additional Equipment:   Intra-op Plan:   Post-operative Plan: Extubation in OR  Informed Consent: I have  reviewed the patients History and Physical, chart, labs and discussed the procedure including the risks, benefits and alternatives for the proposed anesthesia with the patient or authorized representative who has indicated his/her understanding and acceptance.     Dental Advisory Given  Plan Discussed with: Anesthesiologist, CRNA and Surgeon  Anesthesia Plan Comments:         Anesthesia Quick Evaluation

## 2019-05-27 NOTE — Transfer of Care (Signed)
Immediate Anesthesia Transfer of Care Note  Patient: Jonathan Cross.  Procedure(s) Performed: CYSTOSCOPY/URETEROSCOPY/HOLMIUM LASER/STENT PLACEMENT (Right Ureter) CYSTOSCOPY WITH RETROGRADE PYELOGRAM (Right Ureter)  Patient Location: PACU  Anesthesia Type:General  Level of Consciousness: drowsy  Airway & Oxygen Therapy: Patient Spontanous Breathing and Patient connected to face mask oxygen  Post-op Assessment: Report given to RN and Post -op Vital signs reviewed and stable  Post vital signs: Reviewed and stable  Last Vitals:  Vitals Value Taken Time  BP 143/75 05/27/19 1324  Temp 36.4 C 05/27/19 1323  Pulse 95 05/27/19 1326  Resp 23 05/27/19 1326  SpO2 99 % 05/27/19 1326  Vitals shown include unvalidated device data.  Last Pain:  Vitals:   05/27/19 1323  TempSrc:   PainSc: Asleep         Complications: No apparent anesthesia complications

## 2019-05-27 NOTE — Discharge Instructions (Signed)

## 2019-05-28 ENCOUNTER — Encounter: Payer: Self-pay | Admitting: Urology

## 2019-05-30 ENCOUNTER — Other Ambulatory Visit: Payer: Self-pay | Admitting: Radiology

## 2019-05-30 DIAGNOSIS — N200 Calculus of kidney: Secondary | ICD-10-CM

## 2019-06-02 ENCOUNTER — Encounter
Admission: RE | Admit: 2019-06-02 | Discharge: 2019-06-02 | Disposition: A | Discharge status: Home / Self Care | Payer: Medicare HMO | Source: Ambulatory Visit | Attending: Urology | Admitting: Urology

## 2019-06-02 ENCOUNTER — Other Ambulatory Visit: Payer: Self-pay | Admitting: Radiology

## 2019-06-02 NOTE — Patient Instructions (Addendum)
Your procedure is scheduled on: Friday 12/4 Report to Day Surgery. To find out your arrival time please call [phone] between 1PM - 3PM on Thurs 12/3.  Remember: Instructions that are not followed completely may result in serious medical risk,  up to and including death, or upon the discretion of your surgeon and anesthesiologist your  surgery may need to be rescheduled.     _X__ 1. Do not eat food after midnight the night before your procedure.                 No gum chewing or hard candies. You may drink clear liquids up to 2 hours                 before you are scheduled to arrive for your surgery- DO not drink clear                 liquids within 2 hours of the start of your surgery.                 Clear Liquids include:  water,Black Coffee or Tea (Do not add                 anything to coffee or tea).  __X__2.  On the morning of surgery brush your teeth with toothpaste and water, you                may rinse your mouth with mouthwash if you wish.  Do not swallow any toothpaste of mouthwash.     _X__ 3.  No Alcohol for 24 hours before or after surgery.   _X__ 4.  Do Not Smoke or use e-cigarettes For 24 Hours Prior to Your Surgery.                 Do not use any chewable tobacco products for at least 6 hours prior to                 surgery.  ____  5.  Bring all medications with you on the day of surgery if instructed.   _x  6.  Notify your doctor if there is any change in your medical condition      (cold, fever, infections).     Do not wear jewelry, make-up, hairpins, clips or nail polish. Do not wear lotions, powders, or perfumes. You may wear deodorant. Do not shave 48 hours prior to surgery. Men may shave face and neck. Do not bring valuables to the hospital.    [facility] is not responsible for any belongings or valuables.  Contacts, dentures or bridgework may not be worn into surgery. Leave your suitcase in the car. After surgery it may be  brought to your room. For patients admitted to the hospital, discharge time is determined by your treatment team.   Patients discharged the day of surgery will not be allowed to drive home.   Please read over the following fact sheets that you were given:     __x__ Take these medicines the morning of surgery with A SIP OF WATER:    1.amLODipine (NORVASC) 5 MG tablet  2. isosorbide mononitrate (IMDUR) 60 MG 24 hr tablet  3. loratadine (CLARITIN) 10 MG tablet  4.metoprolol succinate (TOPROL-XL) 25 MG 24 hr tablet  5.omeprazole (PRILOSEC) 20 MG capsule  Take a dose the night before and one the moring of surgery  6. Pain medication if you needed it  ____ Fleet Enema (as directed)  ____ Use CHG Soap as directed  ____ Use inhalers on the day of surgery  ____ Stop metformin 2 days prior to surgery    _x___ Take 1/2 of usual insulin dose the night before surgery. No insulin the morning          of surgery.   _x___ Stop aspirin as instructed per MD  _x___ Stop Anti-inflammatories    No more ibuprofen aleve until after surgery.    May take tylenol or pain medication as needed.   ____ Stop supplements until after surgery.    ____ Bring C-Pap to the hospital.

## 2019-06-07 ENCOUNTER — Other Ambulatory Visit: Payer: Medicare HMO

## 2019-06-07 ENCOUNTER — Other Ambulatory Visit: Payer: Self-pay

## 2019-06-07 DIAGNOSIS — Z01812 Encounter for preprocedural laboratory examination: Secondary | ICD-10-CM

## 2019-06-08 LAB — MICROSCOPIC EXAMINATION: Bacteria, UA: NONE SEEN

## 2019-06-08 LAB — URINALYSIS, COMPLETE
Bilirubin, UA: NEGATIVE
Ketones, UA: NEGATIVE
Nitrite, UA: NEGATIVE
Specific Gravity, UA: 1.02 (ref 1.005–1.030)
Urobilinogen, Ur: 0.2 mg/dL (ref 0.2–1.0)
pH, UA: 5.5 (ref 5.0–7.5)

## 2019-06-11 LAB — CULTURE, URINE COMPREHENSIVE

## 2019-06-14 ENCOUNTER — Other Ambulatory Visit
Admission: RE | Admit: 2019-06-14 | Discharge: 2019-06-14 | Disposition: A | Discharge status: Home / Self Care | Payer: Medicare HMO | Source: Ambulatory Visit | Attending: Urology | Admitting: Urology

## 2019-06-14 DIAGNOSIS — Z01812 Encounter for preprocedural laboratory examination: Secondary | ICD-10-CM | POA: Insufficient documentation

## 2019-06-14 DIAGNOSIS — Z20828 Contact with and (suspected) exposure to other viral communicable diseases: Secondary | ICD-10-CM | POA: Diagnosis not present

## 2019-06-14 LAB — SARS CORONAVIRUS 2 (TAT 6-24 HRS): SARS Coronavirus 2: NEGATIVE

## 2019-06-17 ENCOUNTER — Other Ambulatory Visit: Payer: Self-pay

## 2019-06-17 ENCOUNTER — Other Ambulatory Visit: Payer: Self-pay | Admitting: Urology

## 2019-06-17 ENCOUNTER — Ambulatory Visit: Payer: Medicare HMO | Admitting: Anesthesiology

## 2019-06-17 ENCOUNTER — Encounter
Admission: RE | Disposition: A | Discharge status: Home / Self Care | Payer: Self-pay | Source: Home / Self Care | Attending: Urology

## 2019-06-17 ENCOUNTER — Ambulatory Visit
Admission: RE | Admit: 2019-06-17 | Discharge: 2019-06-17 | Disposition: A | Discharge status: Home / Self Care | Payer: Medicare HMO | Attending: Urology | Admitting: Urology

## 2019-06-17 ENCOUNTER — Ambulatory Visit: Payer: Medicare HMO

## 2019-06-17 DIAGNOSIS — E785 Hyperlipidemia, unspecified: Secondary | ICD-10-CM | POA: Insufficient documentation

## 2019-06-17 DIAGNOSIS — I10 Essential (primary) hypertension: Secondary | ICD-10-CM | POA: Diagnosis not present

## 2019-06-17 DIAGNOSIS — G4733 Obstructive sleep apnea (adult) (pediatric): Secondary | ICD-10-CM | POA: Insufficient documentation

## 2019-06-17 DIAGNOSIS — I2510 Atherosclerotic heart disease of native coronary artery without angina pectoris: Secondary | ICD-10-CM | POA: Insufficient documentation

## 2019-06-17 DIAGNOSIS — Z953 Presence of xenogenic heart valve: Secondary | ICD-10-CM | POA: Insufficient documentation

## 2019-06-17 DIAGNOSIS — Z87891 Personal history of nicotine dependence: Secondary | ICD-10-CM | POA: Insufficient documentation

## 2019-06-17 DIAGNOSIS — Z951 Presence of aortocoronary bypass graft: Secondary | ICD-10-CM | POA: Insufficient documentation

## 2019-06-17 DIAGNOSIS — Z6835 Body mass index (BMI) 35.0-35.9, adult: Secondary | ICD-10-CM | POA: Diagnosis not present

## 2019-06-17 DIAGNOSIS — Z794 Long term (current) use of insulin: Secondary | ICD-10-CM | POA: Insufficient documentation

## 2019-06-17 DIAGNOSIS — E119 Type 2 diabetes mellitus without complications: Secondary | ICD-10-CM | POA: Insufficient documentation

## 2019-06-17 DIAGNOSIS — N400 Enlarged prostate without lower urinary tract symptoms: Secondary | ICD-10-CM | POA: Diagnosis not present

## 2019-06-17 DIAGNOSIS — N200 Calculus of kidney: Secondary | ICD-10-CM | POA: Diagnosis present

## 2019-06-17 DIAGNOSIS — Z888 Allergy status to other drugs, medicaments and biological substances status: Secondary | ICD-10-CM | POA: Diagnosis not present

## 2019-06-17 DIAGNOSIS — I4891 Unspecified atrial fibrillation: Secondary | ICD-10-CM | POA: Insufficient documentation

## 2019-06-17 DIAGNOSIS — R109 Unspecified abdominal pain: Secondary | ICD-10-CM

## 2019-06-17 HISTORY — PX: CYSTOSCOPY/URETEROSCOPY/HOLMIUM LASER/STENT PLACEMENT: SHX6546

## 2019-06-17 LAB — GLUCOSE, CAPILLARY
Glucose-Capillary: 202 mg/dL — ABNORMAL HIGH (ref 70–99)
Glucose-Capillary: 180 mg/dL — ABNORMAL HIGH (ref 70–99)

## 2019-06-17 SURGERY — CYSTOSCOPY/URETEROSCOPY/HOLMIUM LASER/STENT PLACEMENT
Anesthesia: General | Site: Ureter | Laterality: Right

## 2019-06-17 MED ORDER — LIDOCAINE HCL 4 % MT SOLN
OROMUCOSAL | Status: DC | PRN
  Administered 2019-06-17: 4 mL via TOPICAL

## 2019-06-17 MED ORDER — LEVOFLOXACIN IN D5W 500 MG/100ML IV SOLN
INTRAVENOUS | Status: AC
  Filled 2019-06-17: qty 100

## 2019-06-17 MED ORDER — LIDOCAINE HCL (PF) 2 % IJ SOLN
INTRAMUSCULAR | Status: AC
  Filled 2019-06-17: qty 10

## 2019-06-17 MED ORDER — FENTANYL CITRATE (PF) 100 MCG/2ML IJ SOLN
INTRAMUSCULAR | Status: AC
  Administered 2019-06-17: 25 ug via INTRAVENOUS
  Filled 2019-06-17: qty 2

## 2019-06-17 MED ORDER — LIDOCAINE HCL (CARDIAC) PF 100 MG/5ML IV SOSY
PREFILLED_SYRINGE | INTRAVENOUS | Status: DC | PRN
  Administered 2019-06-17: 100 mg via INTRATRACHEAL

## 2019-06-17 MED ORDER — PROPOFOL 10 MG/ML IV BOLUS
INTRAVENOUS | Status: AC
  Filled 2019-06-17: qty 20

## 2019-06-17 MED ORDER — FENTANYL CITRATE (PF) 100 MCG/2ML IJ SOLN
INTRAMUSCULAR | Status: AC
  Filled 2019-06-17: qty 2

## 2019-06-17 MED ORDER — OXYCODONE-ACETAMINOPHEN 7.5-325 MG PO TABS: 1.0000 | ORAL_TABLET | ORAL | 0 refills | Status: AC | PRN

## 2019-06-17 MED ORDER — ONDANSETRON HCL 4 MG/2ML IJ SOLN
INTRAMUSCULAR | Status: DC | PRN
  Administered 2019-06-17: 4 mg via INTRAVENOUS

## 2019-06-17 MED ORDER — IOHEXOL 180 MG/ML  SOLN
INTRAMUSCULAR | Status: DC | PRN
  Administered 2019-06-17: 20 mL

## 2019-06-17 MED ORDER — PROPOFOL 10 MG/ML IV BOLUS
INTRAVENOUS | Status: DC | PRN
  Administered 2019-06-17: 120 mg via INTRAVENOUS

## 2019-06-17 MED ORDER — NITROFURANTOIN MACROCRYSTAL 100 MG PO CAPS: 100.0000 mg | ORAL_CAPSULE | Freq: Every day | ORAL | 0 refills | Status: DC

## 2019-06-17 MED ORDER — SUCCINYLCHOLINE CHLORIDE 20 MG/ML IJ SOLN
INTRAMUSCULAR | Status: DC | PRN
  Administered 2019-06-17: 120 mg via INTRAVENOUS

## 2019-06-17 MED ORDER — ONDANSETRON HCL 4 MG/2ML IJ SOLN: 4.0000 mg | Freq: Once | INTRAMUSCULAR | Status: DC | PRN

## 2019-06-17 MED ORDER — ROCURONIUM BROMIDE 50 MG/5ML IV SOLN
INTRAVENOUS | Status: AC
  Filled 2019-06-17: qty 1

## 2019-06-17 MED ORDER — SUGAMMADEX SODIUM 500 MG/5ML IV SOLN
INTRAVENOUS | Status: AC
  Filled 2019-06-17: qty 5

## 2019-06-17 MED ORDER — BELLADONNA ALKALOIDS-OPIUM 16.2-60 MG RE SUPP
RECTAL | Status: DC | PRN
  Administered 2019-06-17: 1 via RECTAL

## 2019-06-17 MED ORDER — SUGAMMADEX SODIUM 500 MG/5ML IV SOLN
INTRAVENOUS | Status: DC | PRN
  Administered 2019-06-17: 300 mg via INTRAVENOUS

## 2019-06-17 MED ORDER — FENTANYL CITRATE (PF) 250 MCG/5ML IJ SOLN
INTRAMUSCULAR | Status: DC | PRN
  Administered 2019-06-17 (×2): 25 ug via INTRAVENOUS
  Administered 2019-06-17: 50 ug via INTRAVENOUS
  Administered 2019-06-17 (×2): 25 ug via INTRAVENOUS
  Administered 2019-06-17: 50 ug via INTRAVENOUS

## 2019-06-17 MED ORDER — PHENYLEPHRINE HCL (PRESSORS) 10 MG/ML IV SOLN
INTRAVENOUS | Status: DC | PRN
  Administered 2019-06-17 (×3): 100 ug via INTRAVENOUS

## 2019-06-17 MED ORDER — FENTANYL CITRATE (PF) 100 MCG/2ML IJ SOLN
25.0000 ug | INTRAMUSCULAR | Status: DC | PRN
  Administered 2019-06-17 (×2): 25 ug via INTRAVENOUS

## 2019-06-17 MED ORDER — SUCCINYLCHOLINE CHLORIDE 20 MG/ML IJ SOLN
INTRAMUSCULAR | Status: AC
  Filled 2019-06-17: qty 1

## 2019-06-17 MED ORDER — SODIUM CHLORIDE 0.9 % IV SOLN
INTRAVENOUS | Status: DC
  Administered 2019-06-17 (×2): via INTRAVENOUS

## 2019-06-17 MED ORDER — LEVOFLOXACIN IN D5W 500 MG/100ML IV SOLN
500.0000 mg | INTRAVENOUS | Status: AC
  Administered 2019-06-17: 500 mg via INTRAVENOUS

## 2019-06-17 MED ORDER — ONDANSETRON HCL 4 MG/2ML IJ SOLN
INTRAMUSCULAR | Status: AC
  Filled 2019-06-17: qty 2

## 2019-06-17 MED ORDER — BELLADONNA ALKALOIDS-OPIUM 16.2-60 MG RE SUPP
RECTAL | Status: AC
  Filled 2019-06-17: qty 1

## 2019-06-17 MED ORDER — ROCURONIUM BROMIDE 100 MG/10ML IV SOLN
INTRAVENOUS | Status: DC | PRN
  Administered 2019-06-17: 5 mg via INTRAVENOUS
  Administered 2019-06-17: 20 mg via INTRAVENOUS
  Administered 2019-06-17: 45 mg via INTRAVENOUS

## 2019-06-17 MED ORDER — DEXAMETHASONE SODIUM PHOSPHATE 10 MG/ML IJ SOLN
INTRAMUSCULAR | Status: DC | PRN
  Administered 2019-06-17: 10 mg via INTRAVENOUS

## 2019-06-17 MED ORDER — DEXAMETHASONE SODIUM PHOSPHATE 4 MG/ML IJ SOLN
INTRAMUSCULAR | Status: AC
  Filled 2019-06-17: qty 1

## 2019-06-17 SURGICAL SUPPLY — 30 items
BAG DRAIN CYSTO-URO LG1000N (MISCELLANEOUS) ×3 IMPLANT
BAG URINE DRAIN 2000ML AR STRL (UROLOGICAL SUPPLIES) ×3 IMPLANT
BRUSH SCRUB EZ 1% IODOPHOR (MISCELLANEOUS) ×3 IMPLANT
CATH FOL 2WAY LX 18X30 (CATHETERS) ×3 IMPLANT
CATH URETL 5X70 OPEN END (CATHETERS) IMPLANT
CNTNR SPEC 2.5X3XGRAD LEK (MISCELLANEOUS) ×1
CONT SPEC 4OZ STER OR WHT (MISCELLANEOUS) ×2
CONTAINER SPEC 2.5X3XGRAD LEK (MISCELLANEOUS) ×1 IMPLANT
DRAPE UTILITY 15X26 TOWEL STRL (DRAPES) ×3 IMPLANT
FIBER LASER TRACTIP 200 (UROLOGICAL SUPPLIES) ×6 IMPLANT
GLOVE BIOGEL PI IND STRL 7.5 (GLOVE) ×1 IMPLANT
GLOVE BIOGEL PI INDICATOR 7.5 (GLOVE) ×2
GOWN STRL REUS W/ TWL LRG LVL3 (GOWN DISPOSABLE) ×1 IMPLANT
GOWN STRL REUS W/ TWL XL LVL3 (GOWN DISPOSABLE) ×1 IMPLANT
GOWN STRL REUS W/TWL LRG LVL3 (GOWN DISPOSABLE) ×2
GOWN STRL REUS W/TWL XL LVL3 (GOWN DISPOSABLE) ×2
GUIDEWIRE STR DUAL SENSOR (WIRE) ×6 IMPLANT
INFUSOR MANOMETER BAG 3000ML (MISCELLANEOUS) ×3 IMPLANT
INTRODUCER DILATOR DOUBLE (INTRODUCER) IMPLANT
KIT TURNOVER CYSTO (KITS) ×3 IMPLANT
PACK CYSTO AR (MISCELLANEOUS) ×3 IMPLANT
SET CYSTO W/LG BORE CLAMP LF (SET/KITS/TRAYS/PACK) ×3 IMPLANT
SHEATH URETERAL 12FRX35CM (MISCELLANEOUS) IMPLANT
SOL .9 NS 3000ML IRR  AL (IV SOLUTION) ×2
SOL .9 NS 3000ML IRR UROMATIC (IV SOLUTION) ×1 IMPLANT
STENT URET 6FRX26 CONTOUR (STENTS) ×3 IMPLANT
SURGILUBE 2OZ TUBE FLIPTOP (MISCELLANEOUS) ×3 IMPLANT
SYR 10ML LL (SYRINGE) ×3 IMPLANT
VALVE UROSEAL ADJ ENDO (VALVE) IMPLANT
WATER STERILE IRR 1000ML POUR (IV SOLUTION) ×3 IMPLANT

## 2019-06-17 NOTE — H&P (Signed)
UROLOGY H&P UPDATE  Agree with prior H&P dated 05/27/2019.  73 year old male with partial right staghorn stone who elected for staged ureteroscopy.  He underwent for staged on 05/27/2019, and is here today for second stage ureteroscopy, laser lithotripsy, and stent exchange.  Cardiac: RRR Lungs: CTA bilaterally  Laterality: Right Procedure: Ureteroscopy, laser lithotripsy, stent exchange  Urine: 11/24 urine culture 3K mixed flora  We specifically discussed the risks ureteroscopy including bleeding, infection/sepsis, stent related symptoms including flank pain/urgency/frequency/incontinence/dysuria, ureteral injury, inability to access stone, or need for staged or additional procedures.   Billey Co, MD 06/17/2019

## 2019-06-17 NOTE — Anesthesia Procedure Notes (Signed)
Procedure Name: Intubation Date/Time: 06/17/2019 10:24 AM Performed by: Eben Burow, CRNA Pre-anesthesia Checklist: Patient identified, Emergency Drugs available, Suction available and Patient being monitored Patient Re-evaluated:Patient Re-evaluated prior to induction Oxygen Delivery Method: Circle system utilized Preoxygenation: Pre-oxygenation with 100% oxygen Induction Type: IV induction Ventilation: Mask ventilation without difficulty and Oral airway inserted - appropriate to patient size Laryngoscope Size: McGraph and 4 Grade View: Grade I Tube type: Oral Tube size: 8.0 mm Number of attempts: 1 Airway Equipment and Method: Stylet,  Oral airway,  Video-laryngoscopy and LTA kit utilized Placement Confirmation: positive ETCO2 and breath sounds checked- equal and bilateral Secured at: 23 cm Tube secured with: Tape Dental Injury: Teeth and Oropharynx as per pre-operative assessment

## 2019-06-17 NOTE — Anesthesia Post-op Follow-up Note (Signed)
Anesthesia QCDR form completed.        

## 2019-06-17 NOTE — Anesthesia Preprocedure Evaluation (Addendum)
Anesthesia Evaluation  Patient identified by MRN, date of birth, ID band Patient awake    Reviewed: Allergy & Precautions, NPO status , Patient's Chart, lab work & pertinent test results  History of Anesthesia Complications Negative for: history of anesthetic complications  Airway Mallampati: III       Dental  (+) Upper Dentures   Pulmonary sleep apnea (not using CPAP) , neg COPD, former smoker,           Cardiovascular hypertension, Pt. on medications + angina + CAD and + CABG  (-) CHF + dysrhythmias Atrial Fibrillation + Valvular Problems/Murmurs (aortic valve replacement)      Neuro/Psych neg Seizures    GI/Hepatic Neg liver ROS, neg GERD  ,  Endo/Other  diabetes, Type 2, Insulin Dependent, Oral Hypoglycemic Agents  Renal/GU      Musculoskeletal   Abdominal   Peds  Hematology   Anesthesia Other Findings   Reproductive/Obstetrics                            Anesthesia Physical Anesthesia Plan  ASA: III  Anesthesia Plan: General   Post-op Pain Management:    Induction: Intravenous  PONV Risk Score and Plan: 2 and Ondansetron and Midazolam  Airway Management Planned: Oral ETT  Additional Equipment:   Intra-op Plan:   Post-operative Plan:   Informed Consent: I have reviewed the patients History and Physical, chart, labs and discussed the procedure including the risks, benefits and alternatives for the proposed anesthesia with the patient or authorized representative who has indicated his/her understanding and acceptance.       Plan Discussed with:   Anesthesia Plan Comments:         Anesthesia Quick Evaluation

## 2019-06-17 NOTE — Transfer of Care (Signed)
Immediate Anesthesia Transfer of Care Note  Patient: Jonathan Cross.  Procedure(s) Performed: CYSTOSCOPY/URETEROSCOPY/HOLMIUM LASER/STENT Exchange (Right Ureter)  Patient Location: PACU  Anesthesia Type:General  Level of Consciousness: drowsy  Airway & Oxygen Therapy: Patient Spontanous Breathing and Patient connected to face mask oxygen  Post-op Assessment: Report given to RN and Post -op Vital signs reviewed and stable  Post vital signs: Reviewed and stable  Last Vitals:  Vitals Value Taken Time  BP 136/72 06/17/19 1239  Temp 36.3 C 06/17/19 1239  Pulse 90 06/17/19 1240  Resp 21 06/17/19 1240  SpO2 99 % 06/17/19 1240  Vitals shown include unvalidated device data.  Last Pain:  Vitals:   06/17/19 0907  TempSrc: Temporal  PainSc: 0-No pain         Complications: No apparent anesthesia complications

## 2019-06-17 NOTE — Anesthesia Postprocedure Evaluation (Signed)
Anesthesia Post Note  Patient: Jonathan Cross.  Procedure(s) Performed: CYSTOSCOPY/URETEROSCOPY/HOLMIUM LASER/STENT Exchange (Right Ureter)  Patient location during evaluation: PACU Anesthesia Type: General Level of consciousness: awake and alert Pain management: pain level controlled Vital Signs Assessment: post-procedure vital signs reviewed and stable Respiratory status: spontaneous breathing and respiratory function stable Cardiovascular status: stable Anesthetic complications: no     Last Vitals:  Vitals:   06/17/19 1247 06/17/19 1255  BP:  130/70  Pulse: 89 86  Resp: 19 13  Temp:    SpO2: 98% 93%    Last Pain:  Vitals:   06/17/19 1255  TempSrc:   PainSc: 3                  Saundra Gin K

## 2019-06-17 NOTE — Op Note (Signed)
Date of procedure: 06/17/19  Preoperative diagnosis:  1. Right staghorn kidney stone  Postoperative diagnosis:  1. Same  Procedure: 1. Cystoscopy, right ureteroscopy, laser lithotripsy, right retrograde pyelogram with intraoperative interpretation, right ureteral stent placement  Surgeon: Nickolas Madrid, MD  Anesthesia: General  Complications: None  Intraoperative findings:  1.  Uncomplicated dusting of residual right staghorn stone.  Stone lasered for 2 hours total.  Significant dust at the conclusion of the procedure limited vision, but no large residual stones identified 2.  No extravasation or abnormalities on retrograde at conclusion of the case 3.  Uncomplicated stent placement  EBL: Minimal  Specimens: None  Drains: Right 6 French by 26cm ureteral stent  Indication: Jonathan Cross. is a 73 y.o. patient with right staghorn kidney stone who elected for staged ureteroscopy for treatment.  He presents today for second stage ureteroscopy of his residual stone burden.  After reviewing the management options for treatment, they elected to proceed with the above surgical procedure(s). We have discussed the potential benefits and risks of the procedure, side effects of the proposed treatment, the likelihood of the patient achieving the goals of the procedure, and any potential problems that might occur during the procedure or recuperation. Informed consent has been obtained.  Description of procedure:  The patient was taken to the operating room and general anesthesia was induced. SCDs were placed for DVT prophylaxis. The patient was placed in the dorsal lithotomy position, prepped and draped in the usual sterile fashion, and preoperative antibiotics(Levaquin) were administered. A preoperative time-out was performed.   A 21 French rigid cystoscope was used to intubate the urethra.  Cystoscopy was normal.  A sensor wire was advanced alongside the right indwelling stent and passed  up to the renal pelvis under fluoroscopic vision.  The stent was then grasped and pulled to the meatus, and a second safety sensor wire was added.  A 12/14 French ureteral access sheath was advanced over the wire under fluoroscopic vision up to the renal pelvis.  The single-channel flexible ureteroscope with the 200 m laser fiber was then used on settings of 0.5 J and 50 hz to methodically dust the remaining staghorn stone.  Lithotripsy was performed for nearly 2 hours.  At the conclusion, no significant residual fragments were seen, however vision was very poor secondary to significant stone dust.  Careful pullback ureteroscopy demonstrated no ureteral fragments or ureteral injury.  Contrast was injected from the proximal ureter and showed no large filling defects or extravasation.    The rigid cystoscope was backloaded over the wire and a 6 [place] by 26 cm stent was uneventfully placed with an excellent curl in the upper pole, as well as in the bladder.  A 20 French Foley was placed to temporarily maximize drainage in the postop period.  Belladonna suppository was placed.  Disposition: Stable to PACU  Plan: Remove Foley prior to discharge CT scan in 2 weeks to evaluate any residual stone burden Alkalinize urine with potassium citrate/with a light If significant residual stone on CT plan for final stage ureteroscopy on 12/28  Nickolas Madrid, MD

## 2019-06-17 NOTE — Discharge Instructions (Signed)

## 2019-06-18 ENCOUNTER — Encounter: Payer: Self-pay | Admitting: Urology

## 2019-06-20 ENCOUNTER — Telehealth: Payer: Self-pay | Admitting: Radiology

## 2019-06-20 ENCOUNTER — Other Ambulatory Visit: Payer: Self-pay | Admitting: Urology

## 2019-06-20 MED ORDER — OXYCODONE-ACETAMINOPHEN 7.5-325 MG PO TABS: 1.0000 | ORAL_TABLET | Freq: Four times a day (QID) | ORAL | 0 refills | Status: AC | PRN

## 2019-06-20 NOTE — Telephone Encounter (Signed)
Patient's son called requesting refill of percocet. Patient reports continued pain despite alternating with ibuprofen.

## 2019-06-20 NOTE — Telephone Encounter (Signed)
Percocet refilled, sent to [facility]  Thanks Nickolas Madrid, MD 06/20/2019

## 2019-06-20 NOTE — Telephone Encounter (Signed)
Notified son, Raef Merkel, of script sent to pharmacy.

## 2019-06-24 ENCOUNTER — Other Ambulatory Visit: Payer: Medicare HMO | Admitting: Urology

## 2019-06-26 ENCOUNTER — Emergency Department
Admission: EM | Admit: 2019-06-26 | Discharge: 2019-06-27 | Disposition: A | Discharge status: Home / Self Care | Payer: Medicare HMO | Source: Home / Self Care | Attending: Emergency Medicine | Admitting: Emergency Medicine

## 2019-06-26 ENCOUNTER — Other Ambulatory Visit: Payer: Self-pay

## 2019-06-26 ENCOUNTER — Encounter: Payer: Self-pay | Admitting: Emergency Medicine

## 2019-06-26 ENCOUNTER — Emergency Department: Payer: Medicare HMO

## 2019-06-26 DIAGNOSIS — A4181 Sepsis due to Enterococcus: Secondary | ICD-10-CM | POA: Diagnosis not present

## 2019-06-26 DIAGNOSIS — Z79899 Other long term (current) drug therapy: Secondary | ICD-10-CM | POA: Insufficient documentation

## 2019-06-26 DIAGNOSIS — I2510 Atherosclerotic heart disease of native coronary artery without angina pectoris: Secondary | ICD-10-CM | POA: Insufficient documentation

## 2019-06-26 DIAGNOSIS — I10 Essential (primary) hypertension: Secondary | ICD-10-CM | POA: Insufficient documentation

## 2019-06-26 DIAGNOSIS — Z794 Long term (current) use of insulin: Secondary | ICD-10-CM | POA: Insufficient documentation

## 2019-06-26 DIAGNOSIS — Z7982 Long term (current) use of aspirin: Secondary | ICD-10-CM | POA: Insufficient documentation

## 2019-06-26 DIAGNOSIS — R509 Fever, unspecified: Secondary | ICD-10-CM | POA: Insufficient documentation

## 2019-06-26 DIAGNOSIS — Z951 Presence of aortocoronary bypass graft: Secondary | ICD-10-CM | POA: Insufficient documentation

## 2019-06-26 DIAGNOSIS — Z954 Presence of other heart-valve replacement: Secondary | ICD-10-CM | POA: Insufficient documentation

## 2019-06-26 DIAGNOSIS — E119 Type 2 diabetes mellitus without complications: Secondary | ICD-10-CM | POA: Insufficient documentation

## 2019-06-26 LAB — CBC WITH DIFFERENTIAL/PLATELET
Abs Immature Granulocytes: 0.07 10*3/uL (ref 0.00–0.07)
Basophils Absolute: 0 10*3/uL (ref 0.0–0.1)
Basophils Relative: 0 %
Eosinophils Absolute: 0 10*3/uL (ref 0.0–0.5)
Eosinophils Relative: 0 %
HCT: 31.3 % — ABNORMAL LOW (ref 39.0–52.0)
Hemoglobin: 10.7 g/dL — ABNORMAL LOW (ref 13.0–17.0)
Immature Granulocytes: 1 %
Lymphocytes Relative: 19 %
Lymphs Abs: 1.5 10*3/uL (ref 0.7–4.0)
MCH: 33.5 pg (ref 26.0–34.0)
MCHC: 34.2 g/dL (ref 30.0–36.0)
MCV: 98.1 fL (ref 80.0–100.0)
Monocytes Absolute: 0.7 10*3/uL (ref 0.1–1.0)
Monocytes Relative: 9 %
Neutro Abs: 5.8 10*3/uL (ref 1.7–7.7)
Neutrophils Relative %: 71 %
Platelets: 105 10*3/uL — ABNORMAL LOW (ref 150–400)
RBC: 3.19 MIL/uL — ABNORMAL LOW (ref 4.22–5.81)
RDW: 15.4 % (ref 11.5–15.5)
WBC: 8.1 10*3/uL (ref 4.0–10.5)
nRBC: 0 % (ref 0.0–0.2)

## 2019-06-26 LAB — COMPREHENSIVE METABOLIC PANEL
ALT: 45 U/L — ABNORMAL HIGH (ref 0–44)
AST: 43 U/L — ABNORMAL HIGH (ref 15–41)
Albumin: 3.6 g/dL (ref 3.5–5.0)
Alkaline Phosphatase: 50 U/L (ref 38–126)
Anion gap: 12 (ref 5–15)
BUN: 17 mg/dL (ref 8–23)
CO2: 19 mmol/L — ABNORMAL LOW (ref 22–32)
Calcium: 8.6 mg/dL — ABNORMAL LOW (ref 8.9–10.3)
Chloride: 99 mmol/L (ref 98–111)
Creatinine, Ser: 1.22 mg/dL (ref 0.61–1.24)
GFR calc Af Amer: >60 mL/min (ref >60)
GFR calc non Af Amer: 58 mL/min — ABNORMAL LOW (ref >60)
Glucose, Bld: 167 mg/dL — ABNORMAL HIGH (ref 70–99)
Potassium: 4.2 mmol/L (ref 3.5–5.1)
Sodium: 130 mmol/L — ABNORMAL LOW (ref 135–145)
Total Bilirubin: 1.1 mg/dL (ref 0.3–1.2)
Total Protein: 7.6 g/dL (ref 6.5–8.1)

## 2019-06-26 LAB — URINALYSIS, COMPLETE (UACMP) WITH MICROSCOPIC
Bilirubin Urine: NEGATIVE
Glucose, UA: NEGATIVE mg/dL
Ketones, ur: NEGATIVE mg/dL
Nitrite: NEGATIVE
Protein, ur: 30 mg/dL — AB
Specific Gravity, Urine: 1.012 (ref 1.005–1.030)
pH: 5 (ref 5.0–8.0)

## 2019-06-26 LAB — LACTIC ACID, PLASMA: Lactic Acid, Venous: 2.1 mmol/L (ref 0.5–1.9)

## 2019-06-26 MED ORDER — SODIUM CHLORIDE 0.9 % IV BOLUS
1000.0000 mL | Freq: Once | INTRAVENOUS | Status: AC
  Administered 2019-06-26: 21:00:00 1000 mL via INTRAVENOUS

## 2019-06-26 MED ORDER — AMOXICILLIN-POT CLAVULANATE 875-125 MG PO TABS
1.0000 | ORAL_TABLET | Freq: Once | ORAL | Status: AC
  Administered 2019-06-26: 1 via ORAL
  Filled 2019-06-26: qty 1

## 2019-06-26 MED ORDER — IBUPROFEN 400 MG PO TABS
400.0000 mg | ORAL_TABLET | Freq: Once | ORAL | Status: AC
  Administered 2019-06-26: 400 mg via ORAL
  Filled 2019-06-26: qty 1

## 2019-06-26 MED ORDER — SODIUM CHLORIDE 0.9% FLUSH: 3.0000 mL | Freq: Once | INTRAVENOUS | Status: DC

## 2019-06-26 MED ORDER — AMOXICILLIN-POT CLAVULANATE 875-125 MG PO TABS: 1.0000 | ORAL_TABLET | Freq: Two times a day (BID) | ORAL | 0 refills | Status: DC

## 2019-06-26 NOTE — ED Provider Notes (Signed)
[facility] Emergency Department Provider Note  ____________________________________________   I have reviewed the triage vital signs and the nursing notes.   HISTORY  Chief Complaint Fever   History limited by: Not Limited   HPI Jonathan Cross. is a 73 y.o. male who presents to the emergency department today because of concern for fever and feeling unwell. He has had low grade fevers off and on over the past week. This has been accompanied by weakness and decreased exertional effort. Today however the patient had higher fevers and felt worse. The patient did undergo lithotripsy with stent placement recently. States that he is still taking the antibiotics that were prescribed at that time. Has had a small amount of cough with phlegm. Denies any abdominal pain. Has not had any nausea or vomiting.     Records reviewed. Per medical record review patient has a history of recent lithotripsy and stent placement 9 days ago.   Past Medical History:  Diagnosis Date  . Coronary artery disease   . Diabetes ([place])   . H/O aortic valve replacement   . History of kidney stones   . Hx of CABG   . Hyperlipidemia   . Hypertension   . Sleep apnea     Patient Active Problem List   Diagnosis Date Noted  . Nonrheumatic aortic valve stenosis 05/13/2019  . Hyperlipidemia LDL goal <70 05/13/2019  . Preop cardiovascular exam 05/13/2019  . Bacteremia 04/18/2019  . Class 2 severe obesity due to excess calories with serious comorbidity and body mass index (BMI) of 38.0 to 38.9 in adult ([place]) 03/24/2018  . History of aortic valve replacement with bioprosthetic valve 08/07/2016  . Status post coronary artery bypass graft 08/07/2016  . Coronary artery disease of native artery of native heart with stable angina pectoris ([place]) 02/11/2016  . SVT (supraventricular tachycardia) ([place]) 07/31/2015  . Type 2 diabetes mellitus without complication, with long-term current use of  insulin ([place]) 03/28/2015  . Benign prostatic hyperplasia 04/07/2014  . OSA (obstructive sleep apnea) 04/07/2014  . Essential hypertension 05/16/2009    Past Surgical History:  Procedure Laterality Date  . AORTIC VALVE REPLACEMENT  2017   [facility], bioprosthetic  . APPENDECTOMY    . CARDIAC VALVE REPLACEMENT    . CORONARY ARTERY BYPASS GRAFT  2017   [facility] - [facility] and SVG-OM  . CYSTOSCOPY W/ RETROGRADES Right 05/27/2019   Procedure: CYSTOSCOPY WITH RETROGRADE PYELOGRAM;  Surgeon: Billey Co, MD;  Location: [facility] ORS;  Service: Urology;  Laterality: Right;  . CYSTOSCOPY/URETEROSCOPY/HOLMIUM LASER/STENT PLACEMENT Right 05/27/2019   Procedure: CYSTOSCOPY/URETEROSCOPY/HOLMIUM LASER/STENT PLACEMENT;  Surgeon: Billey Co, MD;  Location: [facility] ORS;  Service: Urology;  Laterality: Right;  . CYSTOSCOPY/URETEROSCOPY/HOLMIUM LASER/STENT PLACEMENT Right 06/17/2019   Procedure: CYSTOSCOPY/URETEROSCOPY/HOLMIUM LASER/STENT Exchange;  Surgeon: Billey Co, MD;  Location: [facility] ORS;  Service: Urology;  Laterality: Right;  . TEE WITHOUT CARDIOVERSION N/A 04/21/2019   Procedure: TRANSESOPHAGEAL ECHOCARDIOGRAM (TEE);  Surgeon: Minna Merritts, MD;  Location: [facility] ORS;  Service: Cardiovascular;  Laterality: N/A;  . TONSILLECTOMY      Prior to Admission medications   Medication Sig Start Date End Date Taking? Authorizing Provider  amLODipine (NORVASC) 5 MG tablet Take 5 mg by mouth daily. 03/08/19   [provider]  aspirin EC 81 MG tablet Take 81 mg by mouth 3 (three) times a week.  05/30/14   [provider]  atorvastatin (LIPITOR) 80 MG tablet Take 80 mg by mouth daily. 08/13/18  [provider]  diclofenac sodium (VOLTAREN) 1 % GEL Apply 2 g topically 4 (four) times daily as needed (pain).    [provider]  glipiZIDE (GLUCOTROL XL) 2.5 MG 24 hr tablet Take 2.5 mg by mouth daily with breakfast.  05/05/19   [provider]  insulin aspart (NOVOLOG)  100 UNIT/ML injection Inject 0-15 Units into the skin 3 (three) times daily with meals. Patient not taking: Reported on 05/16/2019 04/23/19   Epifanio Lesches, MD  insulin aspart (NOVOLOG) 100 UNIT/ML injection Inject 0-5 Units into the skin at bedtime. 04/23/19   Epifanio Lesches, MD  insulin aspart protamine- aspart (NOVOLOG MIX 70/30) (70-30) 100 UNIT/ML injection Inject 0.3 mLs (30 Units total) into the skin 2 (two) times daily with a meal. Patient not taking: Reported on 06/06/2019 04/23/19   Epifanio Lesches, MD  isosorbide mononitrate (IMDUR) 60 MG 24 hr tablet Take 1 tablet (60 mg total) by mouth daily. 05/12/19 08/10/19  End, Harrell Gave, MD  lisinopril (ZESTRIL) 20 MG tablet Take 1 tablet (20 mg total) by mouth daily. 05/12/19 02/06/20  End, Harrell Gave, MD  loratadine (CLARITIN) 10 MG tablet Take 10 mg by mouth daily. 09/13/18 09/13/19  [provider]  metoprolol succinate (TOPROL-XL) 25 MG 24 hr tablet Take 25 mg by mouth daily. 12/08/18 09/04/19  [provider]  nitrofurantoin (MACRODANTIN) 100 MG capsule Take 1 capsule (100 mg total) by mouth daily. Take daily to prevent infection while stent is in 06/17/19 07/17/19  Billey Co, MD  nitroGLYCERIN (NITROSTAT) 0.4 MG SL tablet Place 0.4 mg under the tongue every 5 (five) minutes as needed for chest pain.  03/24/18 06/17/19  [provider]  NOVOLIN 70/30 RELION (70-30) 100 UNIT/ML injection Inject 80-85 Units into the skin See admin instructions. Inject 85 units into the skin in the morning and 80 units in the evening 05/13/19   [provider]  omeprazole (PRILOSEC) 20 MG capsule Take 20 mg by mouth daily as needed (acid reflux).  04/22/18 06/06/19  [provider]  oxyCODONE-acetaminophen (PERCOCET) 7.5-325 MG tablet Take 1 tablet by mouth every 4 (four) hours as needed for severe pain. 04/23/19   Epifanio Lesches, MD  oxyCODONE-acetaminophen (PERCOCET) 7.5-325 MG tablet Take 1 tablet  by mouth every 6 (six) hours as needed for up to 7 days for severe pain. 06/20/19 06/27/19  Billey Co, MD  Potassium Citrate 15 MEQ (1620 MG) TBCR Take 2 tablets by mouth 2 (two) times daily. Patient taking differently: Take 1 tablet by mouth 2 (two) times daily.  05/27/19   Billey Co, MD  tamsulosin (FLOMAX) 0.4 MG CAPS capsule Take 1 capsule (0.4 mg total) by mouth daily. 05/27/19   Billey Co, MD    Allergies Metformin  Family History  Problem Relation Age of Onset  . Heart attack Father 38    Social History Social History   Tobacco Use  . Smoking status: Former Research scientist (life sciences)  . Smokeless tobacco: Never Used  Substance Use Topics  . Alcohol use: Never  . Drug use: Never    Review of Systems Constitutional: Positive for fever.  Eyes: No visual changes. ENT: No sore throat. Cardiovascular: Denies chest pain. Respiratory: Denies shortness of breath. Gastrointestinal: Positive for diarrhea.  Genitourinary: Negative for dysuria. Musculoskeletal: Negative for back pain. Skin: Negative for rash. Neurological: Negative for headaches, focal weakness or numbness.  ____________________________________________   PHYSICAL EXAM:  VITAL SIGNS: ED Triage Vitals  Enc Vitals Group     BP  06/26/19 1803 (!) 94/53     Pulse Rate 06/26/19 1803 (!) 113     Resp 06/26/19 1803 18     Temp 06/26/19 1803 99.2 F (37.3 C)     Temp Source 06/26/19 1803 Oral     SpO2 06/26/19 1803 96 %     Weight 06/26/19 1800 244 lb 14.9 oz (111.1 kg)     Height 06/26/19 1800 5\' 9"  (1.753 m)     Head Circumference --      Peak Flow --      Pain Score 06/26/19 1800 0   Constitutional: Alert and oriented.  Eyes: Conjunctivae are normal.  ENT      Head: Normocephalic and atraumatic.      Nose: No congestion/rhinnorhea.      Mouth/Throat: Mucous membranes are moist.      Neck: No stridor. Hematological/Lymphatic/Immunilogical: No cervical lymphadenopathy. Cardiovascular: Tachycardic,  regular rhythm.  No murmurs, rubs, or gallops. Respiratory: Normal respiratory effort without tachypnea nor retractions. Breath sounds are clear and equal bilaterally. No wheezes/rales/rhonchi. Gastrointestinal: Soft and non tender. No rebound. No guarding.  Genitourinary: Deferred Musculoskeletal: Normal range of motion in all extremities. No lower extremity edema. Neurologic:  Normal speech and language. No gross focal neurologic deficits are appreciated.  Skin:  Skin is warm, dry and intact. No rash noted. Psychiatric: Mood and affect are normal. Speech and behavior are normal. Patient exhibits appropriate insight and judgment.  ____________________________________________    LABS (pertinent positives/negatives)  Lactic 2.1 CBC wbc 8.1, hgb 10.7, plt 105 CMP na 130, k 4.2, glu 167, cr 1.22 UA cloudy, moderate hgb dipstick, trace leukocytes, 21-50 rbc, 6-10 wbc, few bacteria ____________________________________________   EKG  None  ____________________________________________    RADIOLOGY  CXR No acute abnormality  ____________________________________________   PROCEDURES  Procedures  ____________________________________________   INITIAL IMPRESSION / ASSESSMENT AND PLAN / ED COURSE  Pertinent labs & imaging results that were available during my care of the patient were reviewed by me and considered in my medical decision making (see chart for details).   Patient presented to the emergency department today because of concerns for fever.  Patient had a recent lithotripsy and stent placement.  He denies any significant abdominal pain.  On exam here patient was slightly tachycardic.  Blood work showed a very mild lactic acidosis.  Urine did show few bacteria some white blood cells and some blood in it.  I discussed with Dr. Glori Luis with urology who performed the lithotripsy and stent placement.  Apparently the patient had been placed on prophylactic dose of antibiotics  after the procedure.  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  Did recommend Augmentin for treatment dose.  Did not feel patient needed admission at this time.  I discussed findings and plan with patient family.  They were comfortable going home.  I think this is reasonable.  Will have close follow-up with urology.   ____________________________________________   FINAL CLINICAL IMPRESSION(S) / ED DIAGNOSES  Final diagnoses:  Fever, unspecified fever cause     Note: This dictation was prepared with Dragon dictation. Any transcriptional errors that result from this process are unintentional     Nance Pear, MD 06/27/19 1600

## 2019-06-26 NOTE — Discharge Instructions (Addendum)
Please seek medical attention for any high fevers, chest pain, shortness of breath, change in behavior, persistent vomiting, bloody stool or any other new or concerning symptoms.  

## 2019-06-26 NOTE — ED Triage Notes (Signed)
Lithotripsy done one week ago.  Has been running a fever all night, but son reports fevers intermittently all week.  Also c/o diarrhea all week.  Tylenol given about one hour PTA.  Patient is AAOx3.  Skin warm and dry.

## 2019-06-27 ENCOUNTER — Telehealth: Payer: Self-pay | Admitting: Emergency Medicine

## 2019-06-27 NOTE — Telephone Encounter (Signed)
Lab called with positive blood cultures , Aerobic and Anaerobic bottle. Spoke with with Dr.Goodman and reviewed chart , pt was placed on ABX on discharge.  Was instructed to call pt and see how they are feeling. Pt's wife responded with he is feeling better today.  Reviewed concerns with blood culture , and if the patient becomes worse to return to the ED.  Verbalized understanding

## 2019-06-28 ENCOUNTER — Emergency Department: Payer: Medicare HMO

## 2019-06-28 ENCOUNTER — Other Ambulatory Visit: Payer: Self-pay

## 2019-06-28 ENCOUNTER — Encounter: Payer: Self-pay | Admitting: Emergency Medicine

## 2019-06-28 ENCOUNTER — Inpatient Hospital Stay
Admission: EM | Admit: 2019-06-28 | Discharge: 2019-07-05 | DRG: 872 | Disposition: A | Discharge status: Home Health | Payer: Medicare HMO | Attending: Internal Medicine | Admitting: Internal Medicine

## 2019-06-28 DIAGNOSIS — E876 Hypokalemia: Secondary | ICD-10-CM | POA: Diagnosis present

## 2019-06-28 DIAGNOSIS — Z833 Family history of diabetes mellitus: Secondary | ICD-10-CM

## 2019-06-28 DIAGNOSIS — Z79891 Long term (current) use of opiate analgesic: Secondary | ICD-10-CM

## 2019-06-28 DIAGNOSIS — Z9049 Acquired absence of other specified parts of digestive tract: Secondary | ICD-10-CM | POA: Diagnosis not present

## 2019-06-28 DIAGNOSIS — N179 Acute kidney failure, unspecified: Secondary | ICD-10-CM | POA: Diagnosis present

## 2019-06-28 DIAGNOSIS — R7881 Bacteremia: Secondary | ICD-10-CM | POA: Diagnosis not present

## 2019-06-28 DIAGNOSIS — E1165 Type 2 diabetes mellitus with hyperglycemia: Secondary | ICD-10-CM | POA: Diagnosis present

## 2019-06-28 DIAGNOSIS — Z9089 Acquired absence of other organs: Secondary | ICD-10-CM | POA: Diagnosis not present

## 2019-06-28 DIAGNOSIS — Z952 Presence of prosthetic heart valve: Secondary | ICD-10-CM | POA: Diagnosis not present

## 2019-06-28 DIAGNOSIS — Z87891 Personal history of nicotine dependence: Secondary | ICD-10-CM | POA: Diagnosis not present

## 2019-06-28 DIAGNOSIS — Z960 Presence of urogenital implants: Secondary | ICD-10-CM | POA: Diagnosis present

## 2019-06-28 DIAGNOSIS — I2510 Atherosclerotic heart disease of native coronary artery without angina pectoris: Secondary | ICD-10-CM | POA: Diagnosis present

## 2019-06-28 DIAGNOSIS — I340 Nonrheumatic mitral (valve) insufficiency: Secondary | ICD-10-CM | POA: Diagnosis not present

## 2019-06-28 DIAGNOSIS — I361 Nonrheumatic tricuspid (valve) insufficiency: Secondary | ICD-10-CM | POA: Diagnosis not present

## 2019-06-28 DIAGNOSIS — N200 Calculus of kidney: Secondary | ICD-10-CM | POA: Diagnosis present

## 2019-06-28 DIAGNOSIS — Z823 Family history of stroke: Secondary | ICD-10-CM

## 2019-06-28 DIAGNOSIS — K219 Gastro-esophageal reflux disease without esophagitis: Secondary | ICD-10-CM | POA: Diagnosis present

## 2019-06-28 DIAGNOSIS — R739 Hyperglycemia, unspecified: Secondary | ICD-10-CM

## 2019-06-28 DIAGNOSIS — E119 Type 2 diabetes mellitus without complications: Secondary | ICD-10-CM

## 2019-06-28 DIAGNOSIS — Z87442 Personal history of urinary calculi: Secondary | ICD-10-CM

## 2019-06-28 DIAGNOSIS — E8809 Other disorders of plasma-protein metabolism, not elsewhere classified: Secondary | ICD-10-CM | POA: Diagnosis not present

## 2019-06-28 DIAGNOSIS — E440 Moderate protein-calorie malnutrition: Secondary | ICD-10-CM | POA: Diagnosis present

## 2019-06-28 DIAGNOSIS — E782 Mixed hyperlipidemia: Secondary | ICD-10-CM | POA: Diagnosis present

## 2019-06-28 DIAGNOSIS — Z7982 Long term (current) use of aspirin: Secondary | ICD-10-CM

## 2019-06-28 DIAGNOSIS — Z8679 Personal history of other diseases of the circulatory system: Secondary | ICD-10-CM

## 2019-06-28 DIAGNOSIS — Z79899 Other long term (current) drug therapy: Secondary | ICD-10-CM

## 2019-06-28 DIAGNOSIS — E785 Hyperlipidemia, unspecified: Secondary | ICD-10-CM | POA: Diagnosis not present

## 2019-06-28 DIAGNOSIS — Z8619 Personal history of other infectious and parasitic diseases: Secondary | ICD-10-CM | POA: Diagnosis not present

## 2019-06-28 DIAGNOSIS — E872 Acidosis, unspecified: Secondary | ICD-10-CM

## 2019-06-28 DIAGNOSIS — R509 Fever, unspecified: Secondary | ICD-10-CM | POA: Diagnosis present

## 2019-06-28 DIAGNOSIS — G4733 Obstructive sleep apnea (adult) (pediatric): Secondary | ICD-10-CM | POA: Diagnosis present

## 2019-06-28 DIAGNOSIS — Z8673 Personal history of transient ischemic attack (TIA), and cerebral infarction without residual deficits: Secondary | ICD-10-CM | POA: Diagnosis not present

## 2019-06-28 DIAGNOSIS — A4181 Sepsis due to Enterococcus: Secondary | ICD-10-CM | POA: Diagnosis present

## 2019-06-28 DIAGNOSIS — N400 Enlarged prostate without lower urinary tract symptoms: Secondary | ICD-10-CM | POA: Diagnosis present

## 2019-06-28 DIAGNOSIS — K2100 Gastro-esophageal reflux disease with esophagitis, without bleeding: Secondary | ICD-10-CM | POA: Diagnosis not present

## 2019-06-28 DIAGNOSIS — Z794 Long term (current) use of insulin: Secondary | ICD-10-CM

## 2019-06-28 DIAGNOSIS — Z6836 Body mass index (BMI) 36.0-36.9, adult: Secondary | ICD-10-CM | POA: Diagnosis not present

## 2019-06-28 DIAGNOSIS — I10 Essential (primary) hypertension: Secondary | ICD-10-CM | POA: Diagnosis present

## 2019-06-28 DIAGNOSIS — Z951 Presence of aortocoronary bypass graft: Secondary | ICD-10-CM

## 2019-06-28 DIAGNOSIS — Z888 Allergy status to other drugs, medicaments and biological substances status: Secondary | ICD-10-CM

## 2019-06-28 DIAGNOSIS — R0989 Other specified symptoms and signs involving the circulatory and respiratory systems: Secondary | ICD-10-CM

## 2019-06-28 DIAGNOSIS — Z20828 Contact with and (suspected) exposure to other viral communicable diseases: Secondary | ICD-10-CM | POA: Diagnosis present

## 2019-06-28 DIAGNOSIS — Z8249 Family history of ischemic heart disease and other diseases of the circulatory system: Secondary | ICD-10-CM

## 2019-06-28 DIAGNOSIS — Z87440 Personal history of urinary (tract) infections: Secondary | ICD-10-CM | POA: Diagnosis not present

## 2019-06-28 DIAGNOSIS — Z791 Long term (current) use of non-steroidal anti-inflammatories (NSAID): Secondary | ICD-10-CM

## 2019-06-28 DIAGNOSIS — Z936 Other artificial openings of urinary tract status: Secondary | ICD-10-CM | POA: Diagnosis not present

## 2019-06-28 DIAGNOSIS — B952 Enterococcus as the cause of diseases classified elsewhere: Secondary | ICD-10-CM | POA: Diagnosis not present

## 2019-06-28 LAB — COMPREHENSIVE METABOLIC PANEL
ALT: 38 U/L (ref 0–44)
AST: 40 U/L (ref 15–41)
Albumin: 3.2 g/dL — ABNORMAL LOW (ref 3.5–5.0)
Alkaline Phosphatase: 65 U/L (ref 38–126)
Anion gap: 8 (ref 5–15)
BUN: 19 mg/dL (ref 8–23)
CO2: 22 mmol/L (ref 22–32)
Calcium: 8.4 mg/dL — ABNORMAL LOW (ref 8.9–10.3)
Chloride: 102 mmol/L (ref 98–111)
Creatinine, Ser: 0.99 mg/dL (ref 0.61–1.24)
GFR calc Af Amer: >60 mL/min (ref >60)
GFR calc non Af Amer: >60 mL/min (ref >60)
Glucose, Bld: 222 mg/dL — ABNORMAL HIGH (ref 70–99)
Potassium: 4 mmol/L (ref 3.5–5.1)
Sodium: 132 mmol/L — ABNORMAL LOW (ref 135–145)
Total Bilirubin: 0.8 mg/dL (ref 0.3–1.2)
Total Protein: 6.8 g/dL (ref 6.5–8.1)

## 2019-06-28 LAB — CBC WITH DIFFERENTIAL/PLATELET
Abs Immature Granulocytes: 0.09 10*3/uL — ABNORMAL HIGH (ref 0.00–0.07)
Basophils Absolute: 0 10*3/uL (ref 0.0–0.1)
Basophils Relative: 0 %
Eosinophils Absolute: 0.1 10*3/uL (ref 0.0–0.5)
Eosinophils Relative: 2 %
HCT: 29.7 % — ABNORMAL LOW (ref 39.0–52.0)
Hemoglobin: 10.2 g/dL — ABNORMAL LOW (ref 13.0–17.0)
Immature Granulocytes: 2 %
Lymphocytes Relative: 38 %
Lymphs Abs: 1.8 10*3/uL (ref 0.7–4.0)
MCH: 33.9 pg (ref 26.0–34.0)
MCHC: 34.3 g/dL (ref 30.0–36.0)
MCV: 98.7 fL (ref 80.0–100.0)
Monocytes Absolute: 0.6 10*3/uL (ref 0.1–1.0)
Monocytes Relative: 12 %
Neutro Abs: 2.2 10*3/uL (ref 1.7–7.7)
Neutrophils Relative %: 46 %
Platelets: 106 10*3/uL — ABNORMAL LOW (ref 150–400)
RBC: 3.01 MIL/uL — ABNORMAL LOW (ref 4.22–5.81)
RDW: 15.6 % — ABNORMAL HIGH (ref 11.5–15.5)
WBC: 4.7 10*3/uL (ref 4.0–10.5)
nRBC: 0 % (ref 0.0–0.2)

## 2019-06-28 LAB — LACTIC ACID, PLASMA
Lactic Acid, Venous: 2.3 mmol/L (ref 0.5–1.9)
Lactic Acid, Venous: 1.6 mmol/L (ref 0.5–1.9)

## 2019-06-28 LAB — URINE CULTURE: Culture: NO GROWTH

## 2019-06-28 MED ORDER — GENTAMICIN SULFATE 40 MG/ML IJ SOLN
1.5000 mg/kg | Freq: Two times a day (BID) | INTRAVENOUS | Status: DC
  Filled 2019-06-28 (×2): qty 3.25

## 2019-06-28 MED ORDER — VANCOMYCIN HCL 10 G IV SOLR
1500.0000 mg | Freq: Once | INTRAVENOUS | Status: AC
  Administered 2019-06-28: 1500 mg via INTRAVENOUS
  Filled 2019-06-28: qty 1500

## 2019-06-28 MED ORDER — IOHEXOL 350 MG/ML SOLN
100.0000 mL | Freq: Once | INTRAVENOUS | Status: AC | PRN
  Administered 2019-06-28: 100 mL via INTRAVENOUS

## 2019-06-28 MED ORDER — VANCOMYCIN HCL 10 G IV SOLR
1500.0000 mg | Freq: Once | INTRAVENOUS | Status: DC
  Filled 2019-06-28: qty 1500

## 2019-06-28 MED ORDER — VANCOMYCIN HCL IN DEXTROSE 1-5 GM/200ML-% IV SOLN
1000.0000 mg | Freq: Once | INTRAVENOUS | Status: AC
  Administered 2019-06-28: 1000 mg via INTRAVENOUS
  Filled 2019-06-28: qty 200

## 2019-06-28 MED ORDER — ENOXAPARIN SODIUM 40 MG/0.4ML ~~LOC~~ SOLN
40.0000 mg | SUBCUTANEOUS | Status: DC
  Administered 2019-06-29 – 2019-07-04 (×7): 40 mg via SUBCUTANEOUS
  Filled 2019-06-28 (×7): qty 0.4

## 2019-06-28 MED ORDER — SODIUM CHLORIDE 0.9 % IV SOLN
2.0000 g | Freq: Once | INTRAVENOUS | Status: AC
  Administered 2019-06-28: 2 g via INTRAVENOUS
  Filled 2019-06-28: qty 2

## 2019-06-28 MED ORDER — SODIUM CHLORIDE 0.9 % IV BOLUS
1000.0000 mL | Freq: Once | INTRAVENOUS | Status: AC
  Administered 2019-06-28: 1000 mL via INTRAVENOUS

## 2019-06-28 MED ORDER — SODIUM CHLORIDE 0.9 % IV BOLUS: 500.0000 mL | Freq: Once | INTRAVENOUS | Status: DC

## 2019-06-28 NOTE — ED Triage Notes (Signed)
Pt presents to ED via POV with c/o + blood culture. Per results blood culture + for Enterocaucus Faecalis. Pt c/o feeling run down at this time. VSS upon arrival.

## 2019-06-28 NOTE — ED Notes (Signed)
Pt provided blanket per request

## 2019-06-28 NOTE — ED Notes (Signed)
Vancomycin 1.5 grams scanned but delayed for 37 minutes to let first vancomycin infusion complete per orders.

## 2019-06-28 NOTE — ED Provider Notes (Signed)
[facility] Emergency Department Provider Note  ____________________________________________  Time seen: Approximately 9:54 PM  I have reviewed the triage vital signs and the nursing notes.   HISTORY  Chief Complaint Abnormal Lab    HPI Jonathan Cross. is a 73 y.o. male with a history of CAD diabetes hyperlipidemia hypertension status post CABG with a history of aortic valve replacement, recent treatment for staghorn calculus and Enterococcus bacteremia back and October 2020.  Approximately 10 days ago he had stage ureteroscopy by urology for removal of the staghorn calculus and stent placement.  This was completed uneventfully.  However patient returned to the emergency department  yesterday due to fevers chills and malaise at home ongoing for the past week.  He also has a loss of energy.  He was started on Augmentin and a blood culture was sent yesterday, but is continued to feel bad.  Today the blood culture resulted positive for Enterococcus so patient return to the emergency department..  Review of electronic medical record shows the prior cultures revealing Enterococcus infection have been vancomycin sensitive.     Past Medical History:  Diagnosis Date  . Coronary artery disease   . Diabetes ([place])   . H/O aortic valve replacement   . History of kidney stones   . Hx of CABG   . Hyperlipidemia   . Hypertension   . Sleep apnea      Patient Active Problem List   Diagnosis Date Noted  . Nonrheumatic aortic valve stenosis 05/13/2019  . Hyperlipidemia LDL goal <70 05/13/2019  . Preop cardiovascular exam 05/13/2019  . Bacteremia 04/18/2019  . Class 2 severe obesity due to excess calories with serious comorbidity and body mass index (BMI) of 38.0 to 38.9 in adult ([place]) 03/24/2018  . History of aortic valve replacement with bioprosthetic valve 08/07/2016  . Status post coronary artery bypass graft 08/07/2016  . Coronary artery disease of native  artery of native heart with stable angina pectoris ([place]) 02/11/2016  . SVT (supraventricular tachycardia) ([place]) 07/31/2015  . Type 2 diabetes mellitus without complication, with long-term current use of insulin ([place]) 03/28/2015  . Benign prostatic hyperplasia 04/07/2014  . OSA (obstructive sleep apnea) 04/07/2014  . Essential hypertension 05/16/2009     Past Surgical History:  Procedure Laterality Date  . AORTIC VALVE REPLACEMENT  2017   [facility], bioprosthetic  . APPENDECTOMY    . CARDIAC VALVE REPLACEMENT    . CORONARY ARTERY BYPASS GRAFT  2017   [facility] - [facility] and SVG-OM  . CYSTOSCOPY W/ RETROGRADES Right 05/27/2019   Procedure: CYSTOSCOPY WITH RETROGRADE PYELOGRAM;  Surgeon: Billey Co, MD;  Location: [facility] ORS;  Service: Urology;  Laterality: Right;  . CYSTOSCOPY/URETEROSCOPY/HOLMIUM LASER/STENT PLACEMENT Right 05/27/2019   Procedure: CYSTOSCOPY/URETEROSCOPY/HOLMIUM LASER/STENT PLACEMENT;  Surgeon: Billey Co, MD;  Location: [facility] ORS;  Service: Urology;  Laterality: Right;  . CYSTOSCOPY/URETEROSCOPY/HOLMIUM LASER/STENT PLACEMENT Right 06/17/2019   Procedure: CYSTOSCOPY/URETEROSCOPY/HOLMIUM LASER/STENT Exchange;  Surgeon: Billey Co, MD;  Location: [facility] ORS;  Service: Urology;  Laterality: Right;  . TEE WITHOUT CARDIOVERSION N/A 04/21/2019   Procedure: TRANSESOPHAGEAL ECHOCARDIOGRAM (TEE);  Surgeon: Minna Merritts, MD;  Location: [facility] ORS;  Service: Cardiovascular;  Laterality: N/A;  . TONSILLECTOMY       Prior to Admission medications   Medication Sig Start Date End Date Taking? Authorizing Provider  amLODipine (NORVASC) 5 MG tablet Take 5 mg by mouth daily. 03/08/19   [provider]  amoxicillin-clavulanate (AUGMENTIN) 875-125 MG tablet Take 1 tablet  by mouth 2 (two) times daily for 10 days. 06/26/19 07/06/19  Nance Pear, MD  aspirin EC 81 MG tablet Take 81 mg by mouth 3 (three) times a week.  05/30/14   [provider]  atorvastatin  (LIPITOR) 80 MG tablet Take 80 mg by mouth daily. 08/13/18   [provider]  diclofenac sodium (VOLTAREN) 1 % GEL Apply 2 g topically 4 (four) times daily as needed (pain).    [provider]  glipiZIDE (GLUCOTROL XL) 2.5 MG 24 hr tablet Take 2.5 mg by mouth daily with breakfast.  05/05/19   [provider]  insulin aspart (NOVOLOG) 100 UNIT/ML injection Inject 0-15 Units into the skin 3 (three) times daily with meals. Patient not taking: Reported on 05/16/2019 04/23/19   Epifanio Lesches, MD  insulin aspart (NOVOLOG) 100 UNIT/ML injection Inject 0-5 Units into the skin at bedtime. 04/23/19   Epifanio Lesches, MD  insulin aspart protamine- aspart (NOVOLOG MIX 70/30) (70-30) 100 UNIT/ML injection Inject 0.3 mLs (30 Units total) into the skin 2 (two) times daily with a meal. Patient not taking: Reported on 06/06/2019 04/23/19   Epifanio Lesches, MD  isosorbide mononitrate (IMDUR) 60 MG 24 hr tablet Take 1 tablet (60 mg total) by mouth daily. 05/12/19 08/10/19  End, Harrell Gave, MD  lisinopril (ZESTRIL) 20 MG tablet Take 1 tablet (20 mg total) by mouth daily. 05/12/19 02/06/20  End, Harrell Gave, MD  loratadine (CLARITIN) 10 MG tablet Take 10 mg by mouth daily. 09/13/18 09/13/19  [provider]  metoprolol succinate (TOPROL-XL) 25 MG 24 hr tablet Take 25 mg by mouth daily. 12/08/18 09/04/19  [provider]  nitrofurantoin (MACRODANTIN) 100 MG capsule Take 1 capsule (100 mg total) by mouth daily. Take daily to prevent infection while stent is in 06/17/19 07/17/19  Billey Co, MD  nitroGLYCERIN (NITROSTAT) 0.4 MG SL tablet Place 0.4 mg under the tongue every 5 (five) minutes as needed for chest pain.  03/24/18 06/17/19  [provider]  NOVOLIN 70/30 RELION (70-30) 100 UNIT/ML injection Inject 80-85 Units into the skin See admin instructions. Inject 85 units into the skin in the morning and 80 units in the evening 05/13/19   [provider]   omeprazole (PRILOSEC) 20 MG capsule Take 20 mg by mouth daily as needed (acid reflux).  04/22/18 06/06/19  [provider]  oxyCODONE-acetaminophen (PERCOCET) 7.5-325 MG tablet Take 1 tablet by mouth every 4 (four) hours as needed for severe pain. 04/23/19   Epifanio Lesches, MD  Potassium Citrate 15 MEQ (1620 MG) TBCR Take 2 tablets by mouth 2 (two) times daily. Patient taking differently: Take 1 tablet by mouth 2 (two) times daily.  05/27/19   Billey Co, MD  tamsulosin (FLOMAX) 0.4 MG CAPS capsule Take 1 capsule (0.4 mg total) by mouth daily. 05/27/19   Billey Co, MD     Allergies Metformin   Family History  Problem Relation Age of Onset  . Heart attack Father 105    Social History Social History   Tobacco Use  . Smoking status: Former Research scientist (life sciences)  . Smokeless tobacco: Never Used  Substance Use Topics  . Alcohol use: Never  . Drug use: Never    Review of Systems  Constitutional: Positive fever and chills.  ENT:   No sore throat. No rhinorrhea. Cardiovascular:   No chest pain or syncope. Respiratory:   No dyspnea or cough. Gastrointestinal:   Negative for abdominal pain, vomiting and diarrhea.  Musculoskeletal:   Negative for  focal pain or swelling All other systems reviewed and are negative except as documented above in ROS and HPI.  ____________________________________________   PHYSICAL EXAM:  VITAL SIGNS: ED Triage Vitals  Enc Vitals Group     BP 06/28/19 1824 (!) 108/56     Pulse Rate 06/28/19 1824 96     Resp 06/28/19 1824 16     Temp 06/28/19 1824 98.5 F (36.9 C)     Temp Source 06/28/19 1824 Oral     SpO2 06/28/19 1824 97 %     Weight --      Height --      Head Circumference --      Peak Flow --      Pain Score 06/28/19 2042 0     Pain Loc --      Pain Edu? --      Excl. in [place]? --     Vital signs reviewed, nursing assessments reviewed.   Constitutional:   Alert and oriented.  Ill-appearing. Eyes:   Conjunctivae are  normal. EOMI. PERRL. ENT      Head:   Normocephalic and atraumatic.      Nose:   Wearing a mask.      Mouth/Throat:   Wearing a mask.      Neck:   No meningismus. Full ROM. Hematological/Lymphatic/Immunilogical:   No cervical lymphadenopathy. Cardiovascular:   RRR. Symmetric bilateral radial and DP pulses.  Systolic murmur at aortic and tricuspid areas.  Cap refill less than 2 seconds. Respiratory:   Normal respiratory effort without tachypnea/retractions. Breath sounds are clear and equal bilaterally. No wheezes/rales/rhonchi. Gastrointestinal:   Soft and nontender. Non distended. There is no CVA tenderness.  No rebound, rigidity, or guarding.  Musculoskeletal:   Normal range of motion in all extremities. No joint effusions.  No lower extremity tenderness.  No edema. Neurologic:   Normal speech and language.  Motor grossly intact. No acute focal neurologic deficits are appreciated.  Skin:    Skin is warm, dry and intact. No rash noted.  No petechiae, purpura, or bullae.  ____________________________________________    LABS (pertinent positives/negatives) (all labs ordered are listed, but only abnormal results are displayed) Labs Reviewed  CBC WITH DIFFERENTIAL/PLATELET - Abnormal; Notable for the following components:      Result Value   RBC 3.01 (*)    Hemoglobin 10.2 (*)    HCT 29.7 (*)    RDW 15.6 (*)    Platelets 106 (*)    Abs Immature Granulocytes 0.09 (*)    All other components within normal limits  COMPREHENSIVE METABOLIC PANEL - Abnormal; Notable for the following components:   Sodium 132 (*)    Glucose, Bld 222 (*)    Calcium 8.4 (*)    Albumin 3.2 (*)    All other components within normal limits  LACTIC ACID, PLASMA - Abnormal; Notable for the following components:   Lactic Acid, Venous 2.3 (*)    All other components within normal limits  CULTURE, BLOOD (ROUTINE X 2)  CULTURE, BLOOD (ROUTINE X 2)  SARS CORONAVIRUS 2 (TAT 6-24 HRS)  URINE CULTURE  LACTIC  ACID, PLASMA  URINALYSIS, COMPLETE (UACMP) WITH MICROSCOPIC   ____________________________________________   EKG    ____________________________________________    RADIOLOGY  CT abd  Result Date: 06/28/2019 CLINICAL DATA:  Sepsis, ureteral stent displacement, lower abdominal pain +positive blood cultures EXAM: CT ABDOMEN AND PELVIS WITH CONTRAST TECHNIQUE: Multidetector CT imaging of the abdomen and pelvis was performed using the standard protocol  following bolus administration of intravenous contrast. CONTRAST:  147mL OMNIPAQUE IOHEXOL 350 MG/ML SOLN COMPARISON:  CT abdomen pelvis 04/17/2011 FINDINGS: Lower chest: Normal heart size. No pericardial effusion. Aortic valve replacement is seen. Extensive calcifications of the coronary arteries. Lung bases are clear aside from some atelectatic change. Hepatobiliary: Stable 1.2 cm low-attenuation lesion in segment 4A is unchanged from priors and probably reflects a benign hepatic cysts. Additional subcentimeter hypoattenuating focus in segment 2 is too small to fully characterize but remains statistically likely benign. No worrisome up attic lesions. Smooth hepatic surface contour. There is some mild gallbladder wall thickening without visible calcified gallstones or biliary ductal dilatation. No significant pericholecystic fluid is seen. Pancreas: Unremarkable. No pancreatic ductal dilatation or surrounding inflammatory changes. Spleen: Normal in size without focal abnormality. Adrenals/Urinary Tract: Normal adrenal glands. Kidneys enhance and excrete symmetrically. For multiple residual right-sided calculi, largest in the lower pole measuring up to 1.6 cm in size. A larger staghorn calculus previously seen in the right renal pelvis is no longer evident with a double-J nephroureteral stent in place position proximally in the renal pelvis and distally within the bladder. Some residual right parapelvic and proximal periureteral hazy stranding is present  similar to comparison study. Stable 5.9 cm fluid attenuation cyst in the interpolar left kidney. No bladder calculi or debris. No significant bladder wall abnormality. Stomach/Bowel: Small hiatal hernia. Distal stomach and duodenum are unremarkable. No small bowel dilatation or wall thickening. The appendix is surgically absent. No colonic dilatation or wall thickening. Vascular/Lymphatic: Atherosclerotic plaque within the normal caliber aorta. Additional calcifications in the branch vessels. Some reactive adenopathy in the right retroperitoneum likely related to recent stenting procedure and presumed lithotripsy. Reproductive: The prostate and seminal vesicles are unremarkable. Other: No abdominopelvic free fluid or free gas. No bowel containing hernias. Musculoskeletal: Multilevel degenerative changes are present in the imaged portions of the spine. No acute osseous abnormality or suspicious osseous lesion. Post sternotomy changes in the chest partially visualized. IMPRESSION: 1. Interval placement of a right nephroureteral stent terminating in appropriate positioning within the right renal pelvis and urinary bladder. Resolution of the previously seen large staghorn calculus in the right renal pelvis compatible with lithotripsy. Residual right parapelvic and proximal periureteral hazy stranding is nonspecific but similar to comparison study. 2. Few residual nonobstructing calculi present in the right kidney. 3. Mild gallbladder wall thickening without visible calcified gallstones or biliary ductal dilatation. If there is concern for acute cholecystitis, recommend further evaluation with right upper quadrant ultrasound. 4.  Aortic Atherosclerosis (ICD10-I70.0). 5. Prior sternotomy and aortic valve replacement. Electronically Signed   By: Lovena Le M.D.   On: 06/28/2019 21:57     ____________________________________________   PROCEDURES Procedures  ____________________________________________  DIFFERENTIAL DIAGNOSIS   Bacteremia due to retained kidney stone, endocarditis, pyelonephritis  CLINICAL IMPRESSION / ASSESSMENT AND PLAN / ED COURSE  Medications ordered in the ED: Medications  vancomycin (VANCOCIN) IVPB 1000 mg/200 mL premix (1,000 mg Intravenous New Bag/Given 06/28/19 2208)  gentamicin (GARAMYCIN) 170 mg in dextrose 5 % 50 mL IVPB (has no administration in time range)  vancomycin (VANCOCIN) 1,500 mg in sodium chloride 0.9 % 500 mL IVPB (1,500 mg Intravenous New Bag/Given 06/28/19 2231)  sodium chloride 0.9 % bolus 1,000 mL (0 mLs Intravenous Stopped 06/28/19 2232)  iohexol (OMNIPAQUE) 350 MG/ML injection 100 mL (100 mLs Intravenous Contrast Given 06/28/19 2130)  ceFEPIme (MAXIPIME) 2 g in sodium chloride 0.9 % 100 mL IVPB (0 g Intravenous Stopped 06/28/19 2229)    Pertinent  labs & imaging results that were available during my care of the patient were reviewed by me and considered in my medical decision making (see chart for details).  Jonathan Cross. was evaluated in Emergency Department on 06/28/2019 for the symptoms described in the history of present illness. He was evaluated in the context of the global COVID-19 pandemic, which necessitated consideration that the patient might be at risk for infection with the SARS-CoV-2 virus that causes COVID-19. Institutional protocols and algorithms that pertain to the evaluation of patients at risk for COVID-19 are in a state of rapid change based on information released by regulatory bodies including the CDC and federal and state organizations. These policies and algorithms were followed during the patient's care in the ED.   Patient presents with malaise, subjective fevers and chills for the past week with a blood culture from yesterday that is positive for Enterococcus.  Vital signs are normal, he  is not septic on initial assessment.  No embolic phenomena on exam, but he does have murmur and a known prosthetic heart valve. with the positive blood culture again he will need vancomycin, cefepime, gentamicin, repeat cultures and labs, CT scan abdomen pelvis, admit for further urology and infectious disease consultation.  He may require repeat endocarditis rule out.      ____________________________________________   FINAL CLINICAL IMPRESSION(S) / ED DIAGNOSES    Final diagnoses:  Bacteremia  Kidney stone  Suspected endocarditis     ED Discharge Orders    None      Portions of this note were generated with dragon dictation software. Dictation errors may occur despite best attempts at proofreading.   Carrie Mew, MD 06/28/19 [phone]

## 2019-06-28 NOTE — ED Notes (Signed)
Admitting MD at the bedside for pt evaluation. Pt resting on stretcher with lights out to enhance rest. Iv antibiotics infusing without difficulty.

## 2019-06-28 NOTE — H&P (Signed)
History and Physical  Jonathan Cross. [id number] DOB: 14-Dec-1945 DOA: 06/28/2019  Referring physician: Carrie Mew MD PCP: Georges Mouse, MD  Patient coming from: Home  Chief Complaint: Abnormal labs  HPI: Jonathan Cross. is a 73 y.o. male with medical history significant for type 2 diabetes mellitus, CAD, hyperlipidemia, hypertension, CABG and history of aortic valve replacement who was recently treated for staghorn calculus and Enterococcus bacteremia.  Patient recently had staged ureteroscopy on 11/13 and had a second stage ureteroscopy, laser lithotripsy and stent exchange on 12/14 which was completed uneventfully.  Patient returned yesterday (12/14) due to fever and malaise at home that has been ongoing for about a week, he also complained of decreased energy and was started on antibiotics (Augmentin) with blood culture which resulted today to be positive for Enterococcus, patient returned to the ED today because he continues to feel weak with no improvement.  ED Course:  In the emergency department, vital signs were stable.  Work-up in the ED showed mild hypokalemia, hyperglycemia, hypoalbuminemia analysis was negative.  Blood culture was positive for Enterococcus faecalis and CT abdomen and pelvis showed interval placement of right nephroureteral stent with resolution of the previously seen large staghorn calculus in the right renal pelvis compatible with lithotripsy.  He was treated with IV vancomycin and IV cefepime.  Hospitalist was asked to admit patient for further evaluation and management.  Review of Systems: Constitutional: Positive for generalized weakness and fever.  HENT: Negative for ear pain and sore throat.   Eyes: Negative for pain and visual disturbance.  Respiratory: Negative for cough, chest tightness and shortness of breath.   Cardiovascular: Negative for chest pain and palpitations.  Gastrointestinal: Negative for abdominal pain and  vomiting.  Endocrine: Negative for polyphagia and polyuria.  Genitourinary: Negative for decreased urine volume, dysuria, enuresis Musculoskeletal: Negative for arthralgias and back pain.  Skin: Negative for color change and rash.  Allergic/Immunologic: Negative for immunocompromised state.  Neurological: Negative for tremors, syncope, speech difficulty, weakness, light-headedness and headaches.  Hematological: Does not bruise/bleed easily.  All other systems reviewed and are negative   Past Medical History:  Diagnosis Date  . Coronary artery disease   . Diabetes ([place])   . H/O aortic valve replacement   . History of kidney stones   . Hx of CABG   . Hyperlipidemia   . Hypertension   . Sleep apnea    Past Surgical History:  Procedure Laterality Date  . AORTIC VALVE REPLACEMENT  2017   [facility], bioprosthetic  . APPENDECTOMY    . CARDIAC VALVE REPLACEMENT    . CORONARY ARTERY BYPASS GRAFT  2017   [facility] and SVG-OM  . CYSTOSCOPY W/ RETROGRADES Right 05/27/2019   Procedure: CYSTOSCOPY WITH RETROGRADE PYELOGRAM;  Surgeon: Billey Co, MD;  Location: [facility];  Service: Urology;  Laterality: Right;  . CYSTOSCOPY/URETEROSCOPY/HOLMIUM LASER/STENT PLACEMENT Right 05/27/2019   Procedure: CYSTOSCOPY/URETEROSCOPY/HOLMIUM LASER/STENT PLACEMENT;  Surgeon: Billey Co, MD;  Location: [facility];  Service: Urology;  Laterality: Right;  . CYSTOSCOPY/URETEROSCOPY/HOLMIUM LASER/STENT PLACEMENT Right 06/17/2019   Procedure: CYSTOSCOPY/URETEROSCOPY/HOLMIUM LASER/STENT Exchange;  Surgeon: Billey Co, MD;  Location: [facility];  Service: Urology;  Laterality: Right;  . TEE WITHOUT CARDIOVERSION N/A 04/21/2019   Procedure: TRANSESOPHAGEAL ECHOCARDIOGRAM (TEE);  Surgeon: Minna Merritts, MD;  Location: [facility];  Service: Cardiovascular;  Laterality: N/A;  . TONSILLECTOMY      Social History:  reports that he has quit smoking. He has never used smokeless  tobacco. He reports that he does  not drink alcohol or use drugs.   Allergies  Allergen Reactions  . Metformin Diarrhea    Loose stools even with XR    Family History  Problem Relation Age of Onset  . Heart attack Father 26      Prior to Admission medications   Medication Sig Start Date End Date Taking? Authorizing Provider  amLODipine (NORVASC) 5 MG tablet Take 5 mg by mouth daily. 03/08/19   [provider]  amoxicillin-clavulanate (AUGMENTIN) 875-125 MG tablet Take 1 tablet by mouth 2 (two) times daily for 10 days. 06/26/19 07/06/19  Nance Pear, MD  aspirin EC 81 MG tablet Take 81 mg by mouth 3 (three) times a week.  05/30/14   [provider]  atorvastatin (LIPITOR) 80 MG tablet Take 80 mg by mouth daily. 08/13/18   [provider]  diclofenac sodium (VOLTAREN) 1 % GEL Apply 2 g topically 4 (four) times daily as needed (pain).    [provider]  glipiZIDE (GLUCOTROL XL) 2.5 MG 24 hr tablet Take 2.5 mg by mouth daily with breakfast.  05/05/19   [provider]  insulin aspart (NOVOLOG) 100 UNIT/ML injection Inject 0-15 Units into the skin 3 (three) times daily with meals. Patient not taking: Reported on 05/16/2019 04/23/19   Epifanio Lesches, MD  insulin aspart (NOVOLOG) 100 UNIT/ML injection Inject 0-5 Units into the skin at bedtime. 04/23/19   Epifanio Lesches, MD  insulin aspart protamine- aspart (NOVOLOG MIX 70/30) (70-30) 100 UNIT/ML injection Inject 0.3 mLs (30 Units total) into the skin 2 (two) times daily with a meal. Patient not taking: Reported on 06/06/2019 04/23/19   Epifanio Lesches, MD  isosorbide mononitrate (IMDUR) 60 MG 24 hr tablet Take 1 tablet (60 mg total) by mouth daily. 05/12/19 08/10/19  End, Harrell Gave, MD  lisinopril (ZESTRIL) 20 MG tablet Take 1 tablet (20 mg total) by mouth daily. 05/12/19 02/06/20  End, Harrell Gave, MD  loratadine (CLARITIN) 10 MG tablet Take 10 mg by mouth daily. 09/13/18 09/13/19  [provider]    metoprolol succinate (TOPROL-XL) 25 MG 24 hr tablet Take 25 mg by mouth daily. 12/08/18 09/04/19  [provider]  nitrofurantoin (MACRODANTIN) 100 MG capsule Take 1 capsule (100 mg total) by mouth daily. Take daily to prevent infection while stent is in 06/17/19 07/17/19  Billey Co, MD  nitroGLYCERIN (NITROSTAT) 0.4 MG SL tablet Place 0.4 mg under the tongue every 5 (five) minutes as needed for chest pain.  03/24/18 06/17/19  [provider]  NOVOLIN 70/30 RELION (70-30) 100 UNIT/ML injection Inject 80-85 Units into the skin See admin instructions. Inject 85 units into the skin in the morning and 80 units in the evening 05/13/19   [provider]  omeprazole (PRILOSEC) 20 MG capsule Take 20 mg by mouth daily as needed (acid reflux).  04/22/18 06/06/19  [provider]  oxyCODONE-acetaminophen (PERCOCET) 7.5-325 MG tablet Take 1 tablet by mouth every 4 (four) hours as needed for severe pain. 04/23/19   Epifanio Lesches, MD  Potassium Citrate 15 MEQ (1620 MG) TBCR Take 2 tablets by mouth 2 (two) times daily. Patient taking differently: Take 1 tablet by mouth 2 (two) times daily.  05/27/19   Billey Co, MD  tamsulosin (FLOMAX) 0.4 MG CAPS capsule Take 1 capsule (0.4 mg total) by mouth daily. 05/27/19   Billey Co, MD    Physical Exam: BP 123/60 (BP Location: Left Arm)   Pulse (!) 105  Temp 98.4 F (36.9 C)   Resp 20   SpO2 95%   . General: 73 y.o. year-old male well developed well nourished in no acute distress.  Alert and oriented x3. Marland Kitchen HEENT: Normocephalic and atraumatic . NECK: Supple, trachea medial . Cardiovascular: Regular rate and rhythm with no rubs or gallops.  No thyromegaly or JVD noted.  No lower extremity edema. 2/4 pulses in all 4 extremities. Marland Kitchen Respiratory: Clear to auscultation with no wheezes or rales. Good inspiratory effort. . Abdomen: Soft nontender nondistended with normal bowel sounds x4 quadrants. . Muskuloskeletal:  No cyanosis, clubbing or edema noted bilaterally . Neuro: CN II-XII intact, strength, sensation, reflexes . Skin: No ulcerative lesions noted or rashes . Psychiatry: Judgement and insight appear normal. Mood is appropriate for condition and setting          Labs on Admission:  Basic Metabolic Panel: Recent Labs  Lab 06/26/19 1814 06/28/19 1829 06/29/19 0402  NA 130* 132* 135  K 4.2 4.0 4.0  CL 99 102 107  CO2 19* 22 20*  GLUCOSE 167* 222* 147*  BUN 17 19 15   CREATININE 1.22 0.99 0.88  CALCIUM 8.6* 8.4* 8.0*  MG  --   --  1.9  PHOS  --   --  3.0   Liver Function Tests: Recent Labs  Lab 06/26/19 1814 06/28/19 1829 06/29/19 0402  AST 43* 40 33  ALT 45* 38 36  ALKPHOS 50 65 58  BILITOT 1.1 0.8 0.8  PROT 7.6 6.8 6.5  ALBUMIN 3.6 3.2* 2.9*   No results for input(s): LIPASE, AMYLASE in the last 168 hours. No results for input(s): AMMONIA in the last 168 hours. CBC: Recent Labs  Lab 06/26/19 1814 06/28/19 1829 06/29/19 0402  WBC 8.1 4.7 6.2  NEUTROABS 5.8 2.2  --   HGB 10.7* 10.2* 9.3*  HCT 31.3* 29.7* 28.2*  MCV 98.1 98.7 102.5*  PLT 105* 106* 118*   Cardiac Enzymes: No results for input(s): CKTOTAL, CKMB, CKMBINDEX, TROPONINI in the last 168 hours.  BNP (last 3 results) No results for input(s): BNP in the last 8760 hours.  ProBNP (last 3 results) No results for input(s): PROBNP in the last 8760 hours.  CBG: No results for input(s): GLUCAP in the last 168 hours.  Radiological Exams on Admission: CT abd  Result Date: 06/28/2019 CLINICAL DATA:  Sepsis, ureteral stent displacement, lower abdominal pain +positive blood cultures EXAM: CT ABDOMEN AND PELVIS WITH CONTRAST TECHNIQUE: Multidetector CT imaging of the abdomen and pelvis was performed using the standard protocol following bolus administration of intravenous contrast. CONTRAST:  159mL OMNIPAQUE IOHEXOL 350 MG/ML SOLN COMPARISON:  CT abdomen pelvis 04/17/2011 FINDINGS: Lower chest: Normal heart size.  No pericardial effusion. Aortic valve replacement is seen. Extensive calcifications of the coronary arteries. Lung bases are clear aside from some atelectatic change. Hepatobiliary: Stable 1.2 cm low-attenuation lesion in segment 4A is unchanged from priors and probably reflects a benign hepatic cysts. Additional subcentimeter hypoattenuating focus in segment 2 is too small to fully characterize but remains statistically likely benign. No worrisome up attic lesions. Smooth hepatic surface contour. There is some mild gallbladder wall thickening without visible calcified gallstones or biliary ductal dilatation. No significant pericholecystic fluid is seen. Pancreas: Unremarkable. No pancreatic ductal dilatation or surrounding inflammatory changes. Spleen: Normal in size without focal abnormality. Adrenals/Urinary Tract: Normal adrenal glands. Kidneys enhance and excrete symmetrically. For multiple residual right-sided calculi, largest in the lower pole measuring up to 1.6 cm in size. A larger  staghorn calculus previously seen in the right renal pelvis is no longer evident with a double-J nephroureteral stent in place position proximally in the renal pelvis and distally within the bladder. Some residual right parapelvic and proximal periureteral hazy stranding is present similar to comparison study. Stable 5.9 cm fluid attenuation cyst in the interpolar left kidney. No bladder calculi or debris. No significant bladder wall abnormality. Stomach/Bowel: Small hiatal hernia. Distal stomach and duodenum are unremarkable. No small bowel dilatation or wall thickening. The appendix is surgically absent. No colonic dilatation or wall thickening. Vascular/Lymphatic: Atherosclerotic plaque within the normal caliber aorta. Additional calcifications in the branch vessels. Some reactive adenopathy in the right retroperitoneum likely related to recent stenting procedure and presumed lithotripsy. Reproductive: The prostate and seminal  vesicles are unremarkable. Other: No abdominopelvic free fluid or free gas. No bowel containing hernias. Musculoskeletal: Multilevel degenerative changes are present in the imaged portions of the spine. No acute osseous abnormality or suspicious osseous lesion. Post sternotomy changes in the chest partially visualized. IMPRESSION: 1. Interval placement of a right nephroureteral stent terminating in appropriate positioning within the right renal pelvis and urinary bladder. Resolution of the previously seen large staghorn calculus in the right renal pelvis compatible with lithotripsy. Residual right parapelvic and proximal periureteral hazy stranding is nonspecific but similar to comparison study. 2. Few residual nonobstructing calculi present in the right kidney. 3. Mild gallbladder wall thickening without visible calcified gallstones or biliary ductal dilatation. If there is concern for acute cholecystitis, recommend further evaluation with right upper quadrant ultrasound. 4.  Aortic Atherosclerosis (ICD10-I70.0). 5. Prior sternotomy and aortic valve replacement. Electronically Signed   By: Lovena Le M.D.   On: 06/28/2019 21:57    EKG: I independently viewed the EKG done and my findings are as followed: EKG not done in the ED  Assessment/Plan Present on Admission: . Bacteremia . Essential hypertension . OSA (obstructive sleep apnea) . Hyperlipidemia LDL goal <70  Active Problems:   Bacteremia   Essential hypertension   OSA (obstructive sleep apnea)   Type 2 diabetes mellitus without complication, with long-term current use of insulin (HCC)   Hyperlipidemia LDL goal <70   Hypoalbuminemia   Hyperglycemia   Lactic acidosis   GERD (gastroesophageal reflux disease)   Acute bacteremia Patient presented to the emergency department yesterday due to fever and malaise, he was discharged with antibiotics, blood culture done at that time resulted with positive Enterococcus.  Patient was treated with  IV vancomycin and cefepime  Infectious disease specialist (Dr. Ramon Dredge) consulted and recommended ampicillin and ceftriaxone.  Recommendation appreciated Continue Tylenol as needed for fever  Recent ureteroscopy due to staghorn calculi CT abdomen and pelvis showed interval placement of right nephroureteral stent with resolution of the previously seen large staghorn calculus in the right renal pelvis compatible with lithotripsy. Dr. Bernardo Heater consulted for recommendation regarding acute bacteremia s/p ureteroscopy and lithotripsy.  We shall await ihis recommendation  Hyperglycemia secondary to type 2 diabetes mellitus Continue insulin sliding scale and hypoglycemia protocol Glipizide will be held at this time  Hypoalbuminemia possible secondary to moderate protein calorie malnutrition Albumin 2.9; protein supplements will be provided  Lactic acidosis resolved 2.3> 1.6  Essential hypertension/hyperlipidemia Continue home meds when med rec is updated  GERD Continue PPI  Obstructive sleep apnea Continue CPAP   DVT prophylaxis: Lovenox  Code Status: Full code  Family Communication: None at bedside  Disposition Plan: Home once clinically stable   Consults called: Dr. Delaine Lame (infectious disease), Dr Bernardo Heater  Admission status: Inpatient due to need for antibiotic treatment for enterococcal bacteremia   Bernadette Hoit MD [facility]  If 7PM-7AM, please contact night-coverage [email or web address]  06/29/2019, 5:30 AM

## 2019-06-28 NOTE — Progress Notes (Signed)
Brief Pharmacy Note  Patient is a 73 y/o M with a medical hx of T2DM, CAD s/p CABG, obesity, aortic valve stenosis s/p AVR with bioprosthetic valve, HTN, HLD who presented to [facility] ED 12/13 c/o fever and weakness. Patient also endorsing diarrhea. Patient was discharged on amoxicillin-clavulanate x 10 days. Ucx from ED visit with no growth. Bcx from ED visit with 2/2 bottles Enterococcus faecalis (only 1 set drawn).  He was admitted 10/5-10/10 for Enterococcus bacteremia from urinary source and received ampicillin while in-patient. 10/4 CT significant for Staghorn calculus and mild hydronephrosis. Repeat 10/7 blood cultures with no growth. 10/8 TEE did not show any vegetations. He was sent out on IV ampicillin which was completed on 10/16.  Patient followed up with urology outpatient for Staghorn calculus. 11/5 Ucx with >100k Enterococcus faecalis and he was started on levofloxacin 500 mg daily in anticipation of urological procedure. He elected for staged ureteroscopy, laser lithotripsy, and stenting. Underwent first stage ureteroscopy, laser lithotripsy, and stent placement on 11/13. He was started on nitrofurantoin after the initial procedure as prophylaxis. He underwent the second stage on 12/4 with stent exchange. Nitrofurantoin prophylaxis continued after procedure. Per chart, there was plan for repeat CT in 2 weeks and final stage ureteroscopy on 12/28 if residual stone.  Telephoned # on file and reached patient's son, who was present at recent ED visit, who informed caller patient was not available and was at work. Patient still with intermittent fevers and malaise. Informed son that patient would need to return to the ED for evaluation due to positive blood cultures and would likely require admission. Son acknowledged and said he will plan to bring patient back later today or tomorrow morning.  [place] Resident 28 June 2019

## 2019-06-28 NOTE — ED Notes (Signed)
Pt back from CT at this time. Son in room.

## 2019-06-28 NOTE — ED Notes (Signed)
Urinal provided to pt- sample at bedside if ordered.

## 2019-06-28 NOTE — ED Notes (Signed)
Report to [place], Therapist, sports. Pt flipped to c-pod in ED.

## 2019-06-28 NOTE — Progress Notes (Signed)
PHARMACY -  BRIEF ANTIBIOTIC NOTE   Pharmacy has received consult(s) for Vancomycin ,Cefepime  from an ED provider.  The patient's profile has been reviewed for ht/wt/allergies/indication/available labs.    One time order(s) placed for Vancomycin 2500 mg IV X 1 and Cefepime 2 gm IV X 1.   Further antibiotics/pharmacy consults should be ordered by admitting physician if indicated.                       Thank you, Brynleigh Sequeira D 06/28/2019  9:37 PM

## 2019-06-29 DIAGNOSIS — Z7982 Long term (current) use of aspirin: Secondary | ICD-10-CM

## 2019-06-29 DIAGNOSIS — Z79899 Other long term (current) drug therapy: Secondary | ICD-10-CM

## 2019-06-29 DIAGNOSIS — N200 Calculus of kidney: Secondary | ICD-10-CM

## 2019-06-29 DIAGNOSIS — Z87440 Personal history of urinary (tract) infections: Secondary | ICD-10-CM

## 2019-06-29 DIAGNOSIS — R7881 Bacteremia: Secondary | ICD-10-CM

## 2019-06-29 DIAGNOSIS — Z888 Allergy status to other drugs, medicaments and biological substances status: Secondary | ICD-10-CM

## 2019-06-29 DIAGNOSIS — I2510 Atherosclerotic heart disease of native coronary artery without angina pectoris: Secondary | ICD-10-CM

## 2019-06-29 DIAGNOSIS — R739 Hyperglycemia, unspecified: Secondary | ICD-10-CM

## 2019-06-29 DIAGNOSIS — Z8619 Personal history of other infectious and parasitic diseases: Secondary | ICD-10-CM

## 2019-06-29 DIAGNOSIS — Z87442 Personal history of urinary calculi: Secondary | ICD-10-CM

## 2019-06-29 DIAGNOSIS — N179 Acute kidney failure, unspecified: Secondary | ICD-10-CM

## 2019-06-29 DIAGNOSIS — B952 Enterococcus as the cause of diseases classified elsewhere: Secondary | ICD-10-CM

## 2019-06-29 DIAGNOSIS — Z8673 Personal history of transient ischemic attack (TIA), and cerebral infarction without residual deficits: Secondary | ICD-10-CM

## 2019-06-29 DIAGNOSIS — Z951 Presence of aortocoronary bypass graft: Secondary | ICD-10-CM

## 2019-06-29 DIAGNOSIS — E872 Acidosis, unspecified: Secondary | ICD-10-CM

## 2019-06-29 DIAGNOSIS — K219 Gastro-esophageal reflux disease without esophagitis: Secondary | ICD-10-CM

## 2019-06-29 DIAGNOSIS — E119 Type 2 diabetes mellitus without complications: Secondary | ICD-10-CM

## 2019-06-29 DIAGNOSIS — Z952 Presence of prosthetic heart valve: Secondary | ICD-10-CM

## 2019-06-29 DIAGNOSIS — I10 Essential (primary) hypertension: Secondary | ICD-10-CM

## 2019-06-29 DIAGNOSIS — Z794 Long term (current) use of insulin: Secondary | ICD-10-CM

## 2019-06-29 DIAGNOSIS — E8809 Other disorders of plasma-protein metabolism, not elsewhere classified: Secondary | ICD-10-CM

## 2019-06-29 LAB — PROTIME-INR
INR: 1.1 (ref 0.8–1.2)
Prothrombin Time: 13.9 seconds (ref 11.4–15.2)

## 2019-06-29 LAB — COMPREHENSIVE METABOLIC PANEL
ALT: 36 U/L (ref 0–44)
AST: 33 U/L (ref 15–41)
Albumin: 2.9 g/dL — ABNORMAL LOW (ref 3.5–5.0)
Alkaline Phosphatase: 58 U/L (ref 38–126)
Anion gap: 8 (ref 5–15)
BUN: 15 mg/dL (ref 8–23)
CO2: 20 mmol/L — ABNORMAL LOW (ref 22–32)
Calcium: 8 mg/dL — ABNORMAL LOW (ref 8.9–10.3)
Chloride: 107 mmol/L (ref 98–111)
Creatinine, Ser: 0.88 mg/dL (ref 0.61–1.24)
GFR calc Af Amer: >60 mL/min (ref >60)
GFR calc non Af Amer: >60 mL/min (ref >60)
Glucose, Bld: 147 mg/dL — ABNORMAL HIGH (ref 70–99)
Potassium: 4 mmol/L (ref 3.5–5.1)
Sodium: 135 mmol/L (ref 135–145)
Total Bilirubin: 0.8 mg/dL (ref 0.3–1.2)
Total Protein: 6.5 g/dL (ref 6.5–8.1)

## 2019-06-29 LAB — URINALYSIS, COMPLETE (UACMP) WITH MICROSCOPIC
Bacteria, UA: NONE SEEN
Bilirubin Urine: NEGATIVE
Glucose, UA: NEGATIVE mg/dL
Ketones, ur: NEGATIVE mg/dL
Nitrite: NEGATIVE
Protein, ur: NEGATIVE mg/dL
Specific Gravity, Urine: 1.033 — ABNORMAL HIGH (ref 1.005–1.030)
pH: 6 (ref 5.0–8.0)

## 2019-06-29 LAB — GLUCOSE, CAPILLARY
Glucose-Capillary: 170 mg/dL — ABNORMAL HIGH (ref 70–99)
Glucose-Capillary: 252 mg/dL — ABNORMAL HIGH (ref 70–99)
Glucose-Capillary: 190 mg/dL — ABNORMAL HIGH (ref 70–99)
Glucose-Capillary: 225 mg/dL — ABNORMAL HIGH (ref 70–99)

## 2019-06-29 LAB — URINE CULTURE: Culture: NO GROWTH

## 2019-06-29 LAB — CBC
HCT: 28.2 % — ABNORMAL LOW (ref 39.0–52.0)
Hemoglobin: 9.3 g/dL — ABNORMAL LOW (ref 13.0–17.0)
MCH: 33.8 pg (ref 26.0–34.0)
MCHC: 33 g/dL (ref 30.0–36.0)
MCV: 102.5 fL — ABNORMAL HIGH (ref 80.0–100.0)
Platelets: 118 10*3/uL — ABNORMAL LOW (ref 150–400)
RBC: 2.75 MIL/uL — ABNORMAL LOW (ref 4.22–5.81)
RDW: 15.1 % (ref 11.5–15.5)
WBC: 6.2 10*3/uL (ref 4.0–10.5)
nRBC: 0 % (ref 0.0–0.2)

## 2019-06-29 LAB — SARS CORONAVIRUS 2 (TAT 6-24 HRS): SARS Coronavirus 2: NEGATIVE

## 2019-06-29 LAB — MAGNESIUM: Magnesium: 1.9 mg/dL (ref 1.7–2.4)

## 2019-06-29 LAB — APTT: aPTT: 44 seconds — ABNORMAL HIGH (ref 24–36)

## 2019-06-29 LAB — HEMOGLOBIN A1C
Hgb A1c MFr Bld: 8 % — ABNORMAL HIGH (ref 4.8–5.6)
Mean Plasma Glucose: 182.9 mg/dL

## 2019-06-29 LAB — PHOSPHORUS: Phosphorus: 3 mg/dL (ref 2.5–4.6)

## 2019-06-29 MED ORDER — SODIUM CHLORIDE 0.9 % IV SOLN
INTRAVENOUS | Status: DC | PRN
  Administered 2019-06-29: 500 mL via INTRAVENOUS
  Administered 2019-07-04: 1000 mL via INTRAVENOUS

## 2019-06-29 MED ORDER — SODIUM CHLORIDE 0.9 % IV SOLN: INTRAVENOUS | Status: DC

## 2019-06-29 MED ORDER — GLUCERNA SHAKE PO LIQD
237.0000 mL | Freq: Three times a day (TID) | ORAL | Status: DC
  Administered 2019-06-29 – 2019-07-05 (×16): 237 mL via ORAL

## 2019-06-29 MED ORDER — INSULIN ASPART 100 UNIT/ML ~~LOC~~ SOLN
0.0000 [IU] | Freq: Three times a day (TID) | SUBCUTANEOUS | Status: DC
  Administered 2019-06-29 (×2): 2 [IU] via SUBCUTANEOUS
  Administered 2019-06-29: 5 [IU] via SUBCUTANEOUS
  Administered 2019-06-30: 3 [IU] via SUBCUTANEOUS
  Administered 2019-06-30: 1 [IU] via SUBCUTANEOUS
  Administered 2019-06-30 – 2019-07-01 (×3): 3 [IU] via SUBCUTANEOUS
  Administered 2019-07-01: 2 [IU] via SUBCUTANEOUS
  Administered 2019-07-02 (×2): 3 [IU] via SUBCUTANEOUS
  Administered 2019-07-02: 2 [IU] via SUBCUTANEOUS
  Administered 2019-07-03: 5 [IU] via SUBCUTANEOUS
  Administered 2019-07-03: 2 [IU] via SUBCUTANEOUS
  Administered 2019-07-03: 3 [IU] via SUBCUTANEOUS
  Administered 2019-07-04: 5 [IU] via SUBCUTANEOUS
  Administered 2019-07-04: 2 [IU] via SUBCUTANEOUS
  Administered 2019-07-04: 3 [IU] via SUBCUTANEOUS
  Administered 2019-07-05: 5 [IU] via SUBCUTANEOUS
  Filled 2019-06-29 (×19): qty 1

## 2019-06-29 MED ORDER — PANTOPRAZOLE SODIUM 40 MG PO TBEC
40.0000 mg | DELAYED_RELEASE_TABLET | Freq: Every day | ORAL | Status: DC
  Administered 2019-06-29 – 2019-07-05 (×7): 40 mg via ORAL
  Filled 2019-06-29 (×7): qty 1

## 2019-06-29 MED ORDER — INSULIN ASPART 100 UNIT/ML ~~LOC~~ SOLN
0.0000 [IU] | Freq: Every day | SUBCUTANEOUS | Status: DC
  Administered 2019-06-29 – 2019-07-01 (×3): 2 [IU] via SUBCUTANEOUS
  Administered 2019-07-02: 4 [IU] via SUBCUTANEOUS
  Administered 2019-07-03 – 2019-07-04 (×2): 3 [IU] via SUBCUTANEOUS
  Filled 2019-06-29 (×6): qty 1

## 2019-06-29 MED ORDER — SODIUM CHLORIDE 0.9 % IV SOLN
2.0000 g | Freq: Two times a day (BID) | INTRAVENOUS | Status: DC
  Administered 2019-06-29 – 2019-07-03 (×10): 2 g via INTRAVENOUS
  Filled 2019-06-29 (×4): qty 20
  Filled 2019-06-29 (×5): qty 2
  Filled 2019-06-29 (×2): qty 20
  Filled 2019-06-29 (×4): qty 2

## 2019-06-29 MED ORDER — SODIUM CHLORIDE 0.9 % IV SOLN
2.0000 g | INTRAVENOUS | Status: DC
  Administered 2019-06-29 – 2019-07-05 (×38): 2 g via INTRAVENOUS
  Filled 2019-06-29 (×5): qty 2
  Filled 2019-06-29: qty 2000
  Filled 2019-06-29: qty 2
  Filled 2019-06-29 (×2): qty 2000
  Filled 2019-06-29: qty 2
  Filled 2019-06-29: qty 2000
  Filled 2019-06-29 (×3): qty 2
  Filled 2019-06-29: qty 2000
  Filled 2019-06-29 (×4): qty 2
  Filled 2019-06-29: qty 2000
  Filled 2019-06-29: qty 2
  Filled 2019-06-29 (×2): qty 2000
  Filled 2019-06-29 (×3): qty 2
  Filled 2019-06-29: qty 2000
  Filled 2019-06-29: qty 2
  Filled 2019-06-29 (×2): qty 2000
  Filled 2019-06-29: qty 2
  Filled 2019-06-29 (×2): qty 2000
  Filled 2019-06-29 (×2): qty 2
  Filled 2019-06-29 (×2): qty 2000
  Filled 2019-06-29 (×2): qty 2
  Filled 2019-06-29: qty 2000
  Filled 2019-06-29: qty 2
  Filled 2019-06-29 (×3): qty 2000
  Filled 2019-06-29 (×2): qty 2
  Filled 2019-06-29: qty 2000

## 2019-06-29 MED ORDER — ACETAMINOPHEN 325 MG PO TABS: 650.0000 mg | ORAL_TABLET | Freq: Four times a day (QID) | ORAL | Status: DC | PRN

## 2019-06-29 NOTE — Consult Note (Signed)
NAME: Jonathan Cross.  DOB: 1946/04/07  MRN: [id number]  Date/Time: 06/29/2019 5:31 PM  REQUESTING PROVIDER:Ouma Subjective:  REASON FOR CONSULT: Enterococcus bacteremia ? Julien Skarda. is a 73 y.o. male known to me from prior admission with a history of diabetes mellitus, renal stones, coronary artery disease status post CABG and aortic valve replacement, recent Enterococcus bacteremia with urosepsis, status post lithotripsy and stent is admitted to the hospital with fever.  Patient presented to the ED on 06/26/2019 with fever of 1 week duration. Patient has a complicated urological history.  He was recently in the hospital between 04/18/2019 until 04/23/2019 for Enterococcus bacteremia secondary to urinary tract infection with the same bacteria.  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  There was mild right hydronephrosis.  There was no hydroureter..  During that hospitalization he was on IV antibiotics and a repeat blood culture on 04/20/2019 was negative for Enterococcus.  Had a TEE to rule out prosthetic valve endocarditis which was negative.  He was seen by urologist and was asked to see them as outpatient.  He was sent home on IV ampicillin to complete 14 days of treatment and also to see the urologist prior to the completion of IV antibiotic.  Patient had a urology appointment on 04/28/2019 but he postponed it to 05/12/2019.Marland Kitchen  He saw Dr. Jeb Levering on 05/12/2019 and was scheduled to have right ureteroscopy, laser lithotripsy, stent placement as a staged procedure . Repeat urine culture was sent on 05/19/2019 and it showed Enterococcus faecalis and he was prescribed Levaquin by the urologist.  On 05/27/2019 he underwent cystoscopy and laser lithotripsy and about 50 to 60% staghorn stone was dusted.  He had an uncomplicated ureteral stent placement. He was prescribed nitrofurantoin prophylaxis after the procedure as long as the stent  was in place. On 06/17/2019 he went for the second stage of the procedure and underwent further lasering of the stone for 2 hours total.  Significant test at the conclusion of the procedure limited vision but no large residual stone identified as per urologist note.  The stent was exchanged. He was on nitrofurantoin after that. He presented on 12/13 with high fever of 1 day duration and intermittent low-grade fever for the past few days. HE was sent home home on PO augmentin and was called back yesterday because of enterococcus in blood. No dysuria or hematuria- had some flank pain early on.   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 Metformin Diarrhea    Loose stools even with XR   ? Current Facility-Administered Medications  Medication Dose Route Frequency Provider Last Rate Last Admin  . 0.9 %  sodium chloride infusion   Intravenous PRN Adefeso, Oladapo, DO 10 mL/hr at 06/29/19 0138 500 mL at 06/29/19 0138  . 0.9 %  sodium chloride infusion   Intravenous Continuous Ralene Muskrat B, MD 75 mL/hr at 06/29/19 1055 New Bag at 06/29/19 1055  . acetaminophen (TYLENOL) tablet 650 mg  650 mg Oral Q6H PRN  Bernadette Hoit, DO      . ampicillin (OMNIPEN) 2 g in sodium chloride 0.9 % 100 mL IVPB  2 g Intravenous Q4H Kaiulani Sitton, Joellyn Quails, MD 300 mL/hr at 06/29/19 1719 2 g at 06/29/19 1719  . cefTRIAXone (ROCEPHIN) 2 g in sodium chloride 0.9 % 100 mL IVPB  2 g  Intravenous Q12H Krysia Zahradnik, Joellyn Quails, MD 200 mL/hr at 06/29/19 1019 2 g at 06/29/19 1019  . enoxaparin (LOVENOX) injection 40 mg  40 mg Subcutaneous Q24H Adefeso, Oladapo, DO   40 mg at 06/29/19 0130  . feeding supplement (GLUCERNA SHAKE) (GLUCERNA SHAKE) liquid 237 mL  237 mL Oral TID BM Adefeso, Oladapo, DO   237 mL at 06/29/19 1500  . insulin aspart (novoLOG) injection 0-5 Units  0-5 Units Subcutaneous QHS Adefeso, Oladapo, DO      . insulin aspart (novoLOG) injection 0-9 Units  0-9 Units Subcutaneous TID WC Adefeso, Oladapo, DO   2 Units at 06/29/19 1716  . pantoprazole (PROTONIX) EC tablet 40 mg  40 mg Oral Daily Adefeso, Oladapo, DO   40 mg at 06/29/19 0859     Abtx:  Anti-infectives (From admission, onward)   Start     Dose/Rate Route Frequency Ordered Stop   06/29/19 2330  gentamicin (GARAMYCIN) 130 mg in dextrose 5 % 50 mL IVPB  Status:  Discontinued     1.5 mg/kg  86.9 kg (Adjusted) 106.5 mL/hr over 30 Minutes Intravenous Every 12 hours 06/28/19 2316 06/29/19 0050   06/29/19 1000  cefTRIAXone (ROCEPHIN) 2 g in sodium chloride 0.9 % 100 mL IVPB     2 g 200 mL/hr over 30 Minutes Intravenous Every 12 hours 06/29/19 0050     06/29/19 0100  ampicillin (OMNIPEN) 2 g in sodium chloride 0.9 % 100 mL IVPB     2 g 300 mL/hr over 20 Minutes Intravenous Every 4 hours 06/29/19 0050     06/28/19 2315  gentamicin (GARAMYCIN) 130 mg in dextrose 5 % 50 mL IVPB  Status:  Discontinued     1.5 mg/kg  86.9 kg (Adjusted) 106.5 mL/hr over 30 Minutes Intravenous Every 12 hours 06/28/19 2133 06/28/19 2316   06/28/19 2145  ceFEPIme (MAXIPIME) 2 g in sodium chloride 0.9 % 100 mL IVPB     2 g 200 mL/hr over 30 Minutes Intravenous  Once 06/28/19 2133 06/28/19 2229   06/28/19 2145  vancomycin (VANCOCIN) IVPB 1000 mg/200 mL premix     1,000 mg 200 mL/hr over 60 Minutes Intravenous  Once 06/28/19 2133 06/28/19 2326   06/28/19 2145  vancomycin (VANCOCIN) 1,500 mg in sodium chloride 0.9 % 500 mL IVPB      1,500 mg 250 mL/hr over 120 Minutes Intravenous  Once 06/28/19 2135 06/29/19 0031   06/28/19 2130  vancomycin (VANCOCIN) 1,500 mg in sodium chloride 0.9 % 500 mL IVPB  Status:  Discontinued     1,500 mg 250 mL/hr over 120 Minutes Intravenous  Once 06/28/19 2118 06/28/19 2124      REVIEW OF SYSTEMS:  Const: fever,  chills, negative weight loss Eyes: negative diplopia or visual changes, negative eye pain ENT: negative coryza, negative sore throat Resp: negative cough, hemoptysis, dyspnea Cards: negative for chest pain, palpitations, lower extremity edema GU: negative for frequency, dysuria and hematuria, had flank pain rt before  GI: Negative for abdominal pain, diarrhea, bleeding, constipation Skin: negative for rash and pruritus Heme: negative for easy bruising and gum/nose bleeding MS: fatigue and weakness Neurolo:negative for headaches, dizziness, vertigo, memory problems  Psych: negative for feelings of anxiety, depression  Endocrine: has diabetes Allergy/Immunology- negative for any medication or food allergies ? Pertinent Positives include : Objective:  VITALS:  BP (!) 141/74 (BP Location: Left Arm)   Pulse (!) 102   Temp 97.7 F (36.5 C) (Oral)   Resp 20   SpO2 97%  PHYSICAL EXAM:  General: Alert, cooperative, no distress, appears stated age.  Head: Normocephalic, without obvious abnormality, atraumatic. Eyes: Conjunctivae clear, anicteric sclerae. Pupils are equal ENT Nares normal. No drainage or sinus tenderness. Lips, mucosa, and tongue normal. No Thrush Neck: Supple, symmetrical, no adenopathy, thyroid: non tender no carotid bruit and no JVD. Back: No CVA tenderness. Lungs: b/la ir entry. Heart: s1s2 sternal Surgical car Abdomen: Soft, non-tender,not distended. Bowel sounds normal. No masses Extremities: atraumatic, no cyanosis. No edema. No clubbing Skin: No rashes or lesions. Or bruising Lymph: Cervical, supraclavicular normal. Neurologic: Grossly  non-focal Pertinent Labs Lab Results CBC    Component Value Date/Time   WBC 6.2 06/29/2019 0402   RBC 2.75 (L) 06/29/2019 0402   HGB 9.3 (L) 06/29/2019 0402   HCT 28.2 (L) 06/29/2019 0402   PLT 118 (L) 06/29/2019 0402   MCV 102.5 (H) 06/29/2019 0402   MCH 33.8 06/29/2019 0402   MCHC 33.0 06/29/2019 0402   RDW 15.1 06/29/2019 0402   LYMPHSABS 1.8 06/28/2019 1829   MONOABS 0.6 06/28/2019 1829   EOSABS 0.1 06/28/2019 1829   BASOSABS 0.0 06/28/2019 1829    CMP Latest Ref Rng & Units 06/29/2019 06/28/2019 06/26/2019  Glucose 70 - 99 mg/dL 147(H) 222(H) 167(H)  BUN 8 - 23 mg/dL 15 19 17   Creatinine 0.61 - 1.24 mg/dL 0.88 0.99 1.22  Sodium 135 - 145 mmol/L 135 132(L) 130(L)  Potassium 3.5 - 5.1 mmol/L 4.0 4.0 4.2  Chloride 98 - 111 mmol/L 107 102 99  CO2 22 - 32 mmol/L 20(L) 22 19(L)  Calcium 8.9 - 10.3 mg/dL 8.0(L) 8.4(L) 8.6(L)  Total Protein 6.5 - 8.1 g/dL 6.5 6.8 7.6  Total Bilirubin 0.3 - 1.2 mg/dL 0.8 0.8 1.1  Alkaline Phos 38 - 126 U/L 58 65 50  AST 15 - 41 U/L 33 40 43(H)  ALT 0 - 44 U/L 36 38 45(H)      Microbiology: Recent Results (from the past 240 hour(s))  Blood culture (single)     Status: Abnormal (Preliminary result)   Collection Time: 06/26/19  6:14 PM   Specimen: BLOOD  Result Value Ref Range Status   Specimen Description   Final    BLOOD RIGHT ANTECUBITAL Performed at [facility], [place] [address], [place], [place] [address]    Special Requests   Final    BOTTLES DRAWN AEROBIC AND ANAEROBIC Blood Culture results may not be optimal due to an excessive volume of blood received in culture bottles Performed at [facility], [address] [place], [place] [address]    Culture  Setup Time   Final    GRAM POSITIVE COCCI IN BOTH AEROBIC AND ANAEROBIC BOTTLES CRITICAL RESULT CALLED TO, READ BACK BY AND VERIFIED WITH: Romie Minus AT 1805 06/27/2019  TFK Performed at [facility], [address] [place], [place] [address]     Culture (A)  Final    ENTEROCOCCUS FAECALIS SUSCEPTIBILITIES TO FOLLOW Performed at [facility], [place] [address], [place], [place] [address]    Report Status PENDING  Incomplete  Urine culture     Status: None   Collection Time: 06/26/19  6:14 PM   Specimen: Urine, Random  Result Value Ref Range Status   Specimen Description   Final    URINE, RANDOM Performed at [facility] [address] [place], [place] [address]    Special Requests   Final    NONE Performed at [facility], [address], [place], [place] [address]    Culture   Final    NO GROWTH Performed at [facility], [place] [address], [place], [place] [address]    Report Status 06/28/2019 FINAL  Final  Culture, blood (routine x 2)     Status: None (Preliminary result)   Collection Time: 06/28/19  9:50 PM   Specimen: BLOOD  Result Value Ref Range Status   Specimen Description BLOOD RIGHT ASSIST CONTROL  Final   Special Requests   Final    BOTTLES DRAWN AEROBIC AND ANAEROBIC Blood Culture adequate volume   Culture   Final    NO GROWTH < 12 HOURS Performed at [facility], [address] [place], [place] [address]    Report Status PENDING  Incomplete  Culture, blood (routine x 2)     Status: None (Preliminary result)   Collection Time: 06/28/19  9:50 PM   Specimen: BLOOD  Result Value Ref Range Status   Specimen Description BLOOD LEFT ASSIST CONTROL  Final   Special Requests   Final    BOTTLES DRAWN AEROBIC AND ANAEROBIC Blood Culture adequate volume   Culture   Final    NO GROWTH < 12 HOURS Performed at [facility], [address] [place], [place] [address]    Report Status PENDING  Incomplete  SARS CORONAVIRUS 2 (TAT 6-24 HRS) Nasopharyngeal Nasopharyngeal Swab     Status: None   Collection Time: 06/28/19  9:50 PM   Specimen: Nasopharyngeal Swab  Result Value Ref Range Status   SARS Coronavirus 2 NEGATIVE NEGATIVE Final    Comment: (NOTE) SARS-CoV-2  target nucleic acids are NOT DETECTED. The SARS-CoV-2 RNA is generally detectable in upper and lower respiratory specimens during the acute phase of infection. Negative results do not preclude SARS-CoV-2 infection, do not rule out co-infections with other pathogens, and should not be used as the sole basis for treatment or other patient management decisions. Negative results must be combined with clinical observations, patient history, and epidemiological information. The expected result is Negative. Fact Sheet for Patients: SugarRoll.be Fact Sheet for Healthcare Providers: https://www.woods-mathews.com/ This test is not yet approved or cleared by the [place] FDA and  has been authorized for detection and/or diagnosis of SARS-CoV-2 by FDA under an Emergency Use Authorization (EUA). This EUA will remain  in effect (meaning this test can be used) for the duration of the COVID-19 declaration under Section 56 4(b)(1) of the Act, 21 U.S.C. section 360bbb-3(b)(1), unless the authorization is terminated or revoked sooner. Performed at [facility], [place] [address], [place], [place] [address]     IMAGING RESULTS: I have personally reviewed the films multiple residual right-sided calculi, largest in the lower pole measuring up to 1.6 cm in size. A larger staghorn calculus previously seen in the right renal pelvis is no longer evident with a double-J nephroureteral stent in place position proximally in the renal pelvis and distally within the bladder. Some residual right parapelvic and proximal periureteral hazy stranding is present similar to comparison study. Stable 5.9 cm fluid attenuation cyst in the interpolar left kidney. No bladder calculi or debris. No significant bladder wall abnormality. ?  Impression/Recommendation ? ?73 year old male with history of coronary artery disease, status post CABG and aortic valve replacement, history of  right renal calculus with stent in place is admitted with fever.  Enterococcus bacteremia. Secondary to the right renal stone and stent. Patient was treated for Enterococcus bacteremia in October as well for 2 weeks. He then had lithotripsy and stent placement December 4 and December 13. There is no hydronephrosis. Urine culture from 06/26/2019 is negative As he has a prosthetic aortic valve we need to exclude endocarditis again as well. Patient is currently on ampicillin and ceftriaxone. Recommend urology see the patient and while on IV antibiotics this time the stent to be removed and IV  antibiotics continued beyond that so as to ensure  complete eradication of the bacteria.  Diabetes mellitus on insulin  AKI has resolved __ Stroke coronary artery disease status post CABG Was on metoprolol, aspirin, isosorbide mono nitrate and atorvastatin at home.Marland Kitchen History of aortic valve replacement _________________________________________________ Discussed with patient, requesting provider Note:  This document was prepared using Dragon voice recognition software and may include unintentional dictation errors.

## 2019-06-29 NOTE — Progress Notes (Signed)
PROGRESS NOTE    Jonathan Cross.  MC:[id number] DOB: 1946/06/25 DOA: 06/28/2019 PCP: Georges Mouse, MD    Brief Narrative:  73 y.o. male with medical history significant for type 2 diabetes mellitus, CAD, hyperlipidemia, hypertension, CABG and history of aortic valve replacement who was recently treated for staghorn calculus and Enterococcus bacteremia.  Patient recently had staged ureteroscopy on 11/13 and had a second stage ureteroscopy, laser lithotripsy and stent exchange on 12/14 which was completed uneventfully.  Patient returned yesterday (12/14) due to fever and malaise at home that has been ongoing for about a week, he also complained of decreased energy and was started on antibiotics (Augmentin) with blood culture which resulted today to be positive for Enterococcus, patient returned to the ED today because he continues to feel weak with no improvement.  12/16: Patient continues to endorse severe fatigue and weakness   Assessment & Plan:   Active Problems:   Bacteremia   Essential hypertension   OSA (obstructive sleep apnea)   Type 2 diabetes mellitus without complication, with long-term current use of insulin (HCC)   Hyperlipidemia LDL goal <70   Hypoalbuminemia   Hyperglycemia   Lactic acidosis   GERD (gastroesophageal reflux disease)   Staghorn renal calculus  Enterococcus faecalis bacteremia Sepsis secondary to above Patient presented to the emergency department due to fever and malaise, he was discharged with antibiotics, blood culture done at that time resulted with positive Enterococcus.  Patient was treated with IV vancomycin and cefepime  Infectious disease specialist (Dr. Ramon Dredge) consulted and recommended ampicillin and ceftriaxone.   Plan: Continue empiric ampicillin and ceftriaxone repeat blood cultures tomorrow 06/30/2019 to document clearance of bacteremia Follow up ID recs  Recent ureteroscopy due to staghorn calculi CT abdomen and  pelvis showed interval placement of right nephroureteral stent with resolution of the previously seen large staghorn calculus in the right renal pelvis compatible with lithotripsy. Dr. Bernardo Heater consulted for recommendation regarding acute bacteremia s/p ureteroscopy and lithotripsy.  -No surgical intervention recommended at this time per urology  Hyperglycemia secondary to type 2 diabetes mellitus Continue insulin sliding scale and hypoglycemia protocol Glipizide will be held at this time  Hypoalbuminemia possible secondary to moderate protein calorie malnutrition Albumin 2.9; protein supplements will be provided  Lactic acidosis resolved 2.3> 1.6  Essential hypertension/hyperlipidemia Continue home meds when med rec is updated  GERD Continue PPI  Obstructive sleep apnea Continue CPAP   DVT prophylaxis: Lovenox Code Status: Full Family Communication: Friend at bedside  disposition Plan: Anticipate home pending clinical improvement   Consultants:   Infectious disease  Urology  Procedures: None Antimicrobials:   Ampicillin (12/15 - )  Rocephin (12/15- )   Subjective: Seen and examined No acute overnight events No new complaints Continues to endorse fatigue  Objective: Vitals:   06/28/19 2353 06/29/19 0053 06/29/19 0508 06/29/19 1410  BP: 125/64 (!) 141/70 123/60 (!) 141/74  Pulse: 85 85 (!) 105 (!) 102  Resp: 16 20 20 20   Temp: 98 F (36.7 C) 98.3 F (36.8 C) 98.4 F (36.9 C) 97.7 F (36.5 C)  TempSrc: Oral   Oral  SpO2: 98% 98% 95% 97%    Intake/Output Summary (Last 24 hours) at 06/29/2019 1540 Last data filed at 06/29/2019 1312 Gross per 24 hour  Intake 1730.79 ml  Output 2100 ml  Net -369.21 ml   There were no vitals filed for this visit.  Examination:  General exam: Appears calm and comfortable  Respiratory system: Clear to auscultation. Respiratory  effort normal. Cardiovascular system: S1 & S2 heard, RRR. No JVD, murmurs, rubs,  gallops or clicks. No pedal edema. Gastrointestinal system: Abdomen is nondistended, soft and nontender. No organomegaly or masses felt. Normal bowel sounds heard. Central nervous system: Alert and oriented. No focal neurological deficits. Extremities: Symmetric 5 x 5 power. Skin: No rashes, lesions or ulcers Psychiatry: Judgement and insight appear normal. Mood & affect appropriate.     Data Reviewed: I have personally reviewed following labs and imaging studies  CBC: Recent Labs  Lab 06/26/19 1814 06/28/19 1829 06/29/19 0402  WBC 8.1 4.7 6.2  NEUTROABS 5.8 2.2  --   HGB 10.7* 10.2* 9.3*  HCT 31.3* 29.7* 28.2*  MCV 98.1 98.7 102.5*  PLT 105* 106* [id number]*   Basic Metabolic Panel: Recent Labs  Lab 06/26/19 1814 06/28/19 1829 06/29/19 0402  NA 130* 132* 135  K 4.2 4.0 4.0  CL 99 102 107  CO2 19* 22 20*  GLUCOSE 167* 222* 147*  BUN 17 19 15   CREATININE 1.22 0.99 0.88  CALCIUM 8.6* 8.4* 8.0*  MG  --   --  1.9  PHOS  --   --  3.0   GFR: Estimated Creatinine Clearance: 89.1 mL/min (by C-G formula based on SCr of 0.88 mg/dL). Liver Function Tests: Recent Labs  Lab 06/26/19 1814 06/28/19 1829 06/29/19 0402  AST 43* 40 33  ALT 45* 38 36  ALKPHOS 50 65 58  BILITOT 1.1 0.8 0.8  PROT 7.6 6.8 6.5  ALBUMIN 3.6 3.2* 2.9*   No results for input(s): LIPASE, AMYLASE in the last 168 hours. No results for input(s): AMMONIA in the last 168 hours. Coagulation Profile: Recent Labs  Lab 06/29/19 0402  INR 1.1   Cardiac Enzymes: No results for input(s): CKTOTAL, CKMB, CKMBINDEX, TROPONINI in the last 168 hours. BNP (last 3 results) No results for input(s): PROBNP in the last 8760 hours. HbA1C: Recent Labs    06/29/19 0402  HGBA1C 8.0*   CBG: Recent Labs  Lab 06/29/19 0752 06/29/19 1141  GLUCAP 170* 252*   Lipid Profile: No results for input(s): CHOL, HDL, LDLCALC, TRIG, CHOLHDL, LDLDIRECT in the last 72 hours. Thyroid Function Tests: No results for input(s):  TSH, T4TOTAL, FREET4, T3FREE, THYROIDAB in the last 72 hours. Anemia Panel: No results for input(s): VITAMINB12, FOLATE, FERRITIN, TIBC, IRON, RETICCTPCT in the last 72 hours. Sepsis Labs: Recent Labs  Lab 06/26/19 1814 06/28/19 1831 06/28/19 2029  LATICACIDVEN 2.1* 2.3* 1.6    Recent Results (from the past 240 hour(s))  Blood culture (single)     Status: Abnormal (Preliminary result)   Collection Time: 06/26/19  6:14 PM   Specimen: BLOOD  Result Value Ref Range Status   Specimen Description   Final    BLOOD RIGHT ANTECUBITAL Performed at [facility], [place] [address], [place], [place] [address]    Special Requests   Final    BOTTLES DRAWN AEROBIC AND ANAEROBIC Blood Culture results may not be optimal due to an excessive volume of blood received in culture bottles Performed at [facility], [address] [place], [place] [address]    Culture  Setup Time   Final    GRAM POSITIVE COCCI IN BOTH AEROBIC AND ANAEROBIC BOTTLES CRITICAL RESULT CALLED TO, READ BACK BY AND VERIFIED WITH: Romie Minus AT 1805 06/27/2019  TFK Performed at [facility], [address] [place], [place] [address]    Culture (A)  Final    ENTEROCOCCUS FAECALIS SUSCEPTIBILITIES TO  FOLLOW Performed at [facility], [place] [address], [place], [place] [address]    Report Status PENDING  Incomplete  Urine culture     Status: None   Collection Time: 06/26/19  6:14 PM   Specimen: Urine, Random  Result Value Ref Range Status   Specimen Description   Final    URINE, RANDOM Performed at [facility], [address] [place], [place] [address]    Special Requests   Final    NONE Performed at [facility], [address], [place], [place] [address]    Culture   Final    NO GROWTH Performed at [facility], [place] [address], [place], [place] [address]    Report Status 06/28/2019 FINAL  Final  Culture, blood (routine x 2)     Status: None (Preliminary  result)   Collection Time: 06/28/19  9:50 PM   Specimen: BLOOD  Result Value Ref Range Status   Specimen Description BLOOD RIGHT ASSIST CONTROL  Final   Special Requests   Final    BOTTLES DRAWN AEROBIC AND ANAEROBIC Blood Culture adequate volume   Culture   Final    NO GROWTH < 12 HOURS Performed at [facility], [address] [place], [place] [address]    Report Status PENDING  Incomplete  Culture, blood (routine x 2)     Status: None (Preliminary result)   Collection Time: 06/28/19  9:50 PM   Specimen: BLOOD  Result Value Ref Range Status   Specimen Description BLOOD LEFT ASSIST CONTROL  Final   Special Requests   Final    BOTTLES DRAWN AEROBIC AND ANAEROBIC Blood Culture adequate volume   Culture   Final    NO GROWTH < 12 HOURS Performed at [facility], [address], [place], [place] [address]    Report Status PENDING  Incomplete  SARS CORONAVIRUS 2 (TAT 6-24 HRS) Nasopharyngeal Nasopharyngeal Swab     Status: None   Collection Time: 06/28/19  9:50 PM   Specimen: Nasopharyngeal Swab  Result Value Ref Range Status   SARS Coronavirus 2 NEGATIVE NEGATIVE Final    Comment: (NOTE) SARS-CoV-2 target nucleic acids are NOT DETECTED. The SARS-CoV-2 RNA is generally detectable in upper and lower respiratory specimens during the acute phase of infection. Negative results do not preclude SARS-CoV-2 infection, do not rule out co-infections with other pathogens, and should not be used as the sole basis for treatment or other patient management decisions. Negative results must be combined with clinical observations, patient history, and epidemiological information. The expected result is Negative. Fact Sheet for Patients: SugarRoll.be Fact Sheet for Healthcare Providers: https://www.woods-mathews.com/ This test is not yet approved or cleared by the [place] FDA and  has been authorized for detection and/or  diagnosis of SARS-CoV-2 by FDA under an Emergency Use Authorization (EUA). This EUA will remain  in effect (meaning this test can be used) for the duration of the COVID-19 declaration under Section 56 4(b)(1) of the Act, 21 U.S.C. section 360bbb-3(b)(1), unless the authorization is terminated or revoked sooner. Performed at [facility], [place] [address], [place],  [address]          Radiology Studies: CT abd  Result Date: 06/28/2019 CLINICAL DATA:  Sepsis, ureteral stent displacement, lower abdominal pain +positive blood cultures EXAM: CT ABDOMEN AND PELVIS WITH CONTRAST TECHNIQUE: Multidetector CT imaging of the abdomen and pelvis was performed using the standard protocol following bolus administration of intravenous contrast. CONTRAST:  136mL OMNIPAQUE IOHEXOL 350 MG/ML SOLN COMPARISON:  CT abdomen pelvis 04/17/2011 FINDINGS: Lower chest: Normal heart size. No pericardial effusion. Aortic valve replacement is seen. Extensive calcifications of the coronary arteries. Lung bases are clear aside from some atelectatic change. Hepatobiliary: Stable 1.2 cm low-attenuation lesion in segment 4A is unchanged from priors and probably reflects a benign hepatic cysts. Additional subcentimeter hypoattenuating focus in segment 2 is too small to fully characterize but remains statistically likely benign. No worrisome up attic lesions. Smooth hepatic surface contour. There is some mild gallbladder wall thickening without visible calcified gallstones or biliary ductal dilatation. No significant pericholecystic fluid is seen. Pancreas: Unremarkable. No pancreatic ductal dilatation or surrounding inflammatory changes. Spleen: Normal in size without focal abnormality. Adrenals/Urinary Tract: Normal adrenal glands. Kidneys enhance and excrete symmetrically. For multiple residual right-sided calculi, largest in the lower pole measuring up to 1.6 cm in size. A larger staghorn calculus previously seen in the  right renal pelvis is no longer evident with a double-J nephroureteral stent in place position proximally in the renal pelvis and distally within the bladder. Some residual right parapelvic and proximal periureteral hazy stranding is present similar to comparison study. Stable 5.9 cm fluid attenuation cyst in the interpolar left kidney. No bladder calculi or debris. No significant bladder wall abnormality. Stomach/Bowel: Small hiatal hernia. Distal stomach and duodenum are unremarkable. No small bowel dilatation or wall thickening. The appendix is surgically absent. No colonic dilatation or wall thickening. Vascular/Lymphatic: Atherosclerotic plaque within the normal caliber aorta. Additional calcifications in the branch vessels. Some reactive adenopathy in the right retroperitoneum likely related to recent stenting procedure and presumed lithotripsy. Reproductive: The prostate and seminal vesicles are unremarkable. Other: No abdominopelvic free fluid or free gas. No bowel containing hernias. Musculoskeletal: Multilevel degenerative changes are present in the imaged portions of the spine. No acute osseous abnormality or suspicious osseous lesion. Post sternotomy changes in the chest partially visualized. IMPRESSION: 1. Interval placement of a right nephroureteral stent terminating in appropriate positioning within the right renal pelvis and urinary bladder. Resolution of the previously seen large staghorn calculus in the right renal pelvis compatible with lithotripsy. Residual right parapelvic and proximal periureteral hazy stranding is nonspecific but similar to comparison study. 2. Few residual nonobstructing calculi present in the right kidney. 3. Mild gallbladder wall thickening without visible calcified gallstones or biliary ductal dilatation. If there is concern for acute cholecystitis, recommend further evaluation with right upper quadrant ultrasound. 4.  Aortic Atherosclerosis (ICD10-I70.0). 5. Prior  sternotomy and aortic valve replacement. Electronically Signed   By: Lovena Le M.D.   On: 06/28/2019 21:57        Scheduled Meds: . enoxaparin (LOVENOX) injection  40 mg Subcutaneous Q24H  . feeding supplement (GLUCERNA SHAKE)  237 mL Oral TID BM  . insulin aspart  0-5 Units Subcutaneous QHS  . insulin aspart  0-9 Units Subcutaneous TID WC  . pantoprazole  40 mg Oral Daily   Continuous Infusions: . sodium chloride 500 mL (06/29/19 0138)  . sodium chloride 75 mL/hr at 06/29/19 1055  . ampicillin (OMNIPEN) IV 2 g (06/29/19 1242)  . cefTRIAXone (ROCEPHIN)  IV 2 g (06/29/19 1019)     LOS: 1 day    Time spent: 35 minutes    Sidney Ace, MD [facility] Pager [phone]  If 7PM-7AM, please contact night-coverage [email or web address] Password [email or web address] 06/29/2019, 3:40 PM

## 2019-06-29 NOTE — Consult Note (Signed)
Urology Consult   Requesting provider: Rufina Falco, NP  Reason for consultation: Sepsis status post ureteroscopy  History of Present Illness: Jonathan Burkhard. is a 73 y.o. male with a history of a right staghorn calculus status post staged ureteroscopy by Dr. Diamantina [place].  Second stage was performed on 06/17/2019 which was uneventful.  Significant dust due to stone burden was noted at the conclusion procedure however no large residual stones were identified.  He was scheduled for a follow-up CT this week to evaluate for residual stone burden.  He presented to the ED on 12/13 complaining of intermittent low-grade fever and malaise.  He was on a low-dose of nitrofurantoin prophylactically.  After discussing with urology antibiotics were changed to Augmentin and urine culture was ordered.  The urine culture was subsequently negative.  However blood cultures were drawn were positive for Enterococcus.  He was contacted on 12/14 and was feeling better with the antibiotics and was given precautions to return if he felt worse.  He returned to the ED last night for persistent symptoms.  He denies flank or abdominal pain.  He has been afebrile since admission with stable vital signs.  He has a history of aortic valve replacement and infectious disease has been consulted.   Past Medical History:  Diagnosis Date  . Coronary artery disease   . Diabetes ([place])   . H/O aortic valve replacement   . History of kidney stones   . Hx of CABG   . Hyperlipidemia   . Hypertension   . Sleep apnea     Past Surgical History:  Procedure Laterality Date  . AORTIC VALVE REPLACEMENT  2017   [facility], bioprosthetic  . APPENDECTOMY    . CARDIAC VALVE REPLACEMENT    . CORONARY ARTERY BYPASS GRAFT  2017   [facility] and SVG-OM  . CYSTOSCOPY W/ RETROGRADES Right 05/27/2019   Procedure: CYSTOSCOPY WITH RETROGRADE PYELOGRAM;  Surgeon: Billey Co, MD;  Location: [facility] ORS;  Service: Urology;   Laterality: Right;  . CYSTOSCOPY/URETEROSCOPY/HOLMIUM LASER/STENT PLACEMENT Right 05/27/2019   Procedure: CYSTOSCOPY/URETEROSCOPY/HOLMIUM LASER/STENT PLACEMENT;  Surgeon: Billey Co, MD;  Location: [facility] OR[facility];  Service: Urology;  Laterality: Right;  . CYSTOSCOPY/URETEROSCOPY/HOLMIUM LASER/STENT PLACEMENT Right 06/17/2019   Procedure: CYSTOSCOPY/URETEROSCOPY/HOLMIUM LASER/STENT Exchange;  Surgeon: Billey Co, MD;  Location: [facility] ORS;  Service: Urology;  Laterality: Right;  . TEE WITHOUT CARDIOVERSION N/A 04/21/2019   Procedure: TRANSESOPHAGEAL ECHOCARDIOGRAM (TEE);  Surgeon: Minna Merritts, MD;  Location: [facility] ORS;  Service: Cardiovascular;  Laterality: N/A;  . TONSILLECTOMY      Home Medications:  No outpatient medications have been marked as taking for the 06/28/19 encounter [facility] Encounter).    Allergies:  Allergies  Allergen Reactions  . Metformin Diarrhea    Loose stools even with XR    Family History  Problem Relation Age of Onset  . Heart attack Father 19    Social History:  reports that he has quit smoking. He has never used smokeless tobacco. He reports that he does not drink alcohol or use drugs.  ROS: A complete review of systems was performed.  All systems are negative except for pertinent findings as noted.  Physical Exam:  Vital signs in last 24 hours: Temp:  [98 F (36.7 C)-98.5 F (36.9 C)] 98.4 F (36.9 C) (12/16 0508) Pulse Rate:  [85-105] 105 (12/16 0508) Resp:  [16-20] 20 (12/16 0508) BP: (108-141)/(56-70) 123/60 (12/16 0508) SpO2:  [95 %-98 %] 95 % (12/16 0508)  Constitutional:  Alert, No acute distress HEENT: [place] AT, moist mucus membranes.  Trachea midline, no masses Respiratory: Normal respiratory effort GI: Abdomen is soft, nontender, nondistended, no abdominal masses   Laboratory Data:  Recent Labs    06/26/19 1814 06/28/19 1829 06/29/19 0402  WBC 8.1 4.7 6.2  HGB 10.7* 10.2* 9.3*  HCT 31.3* 29.7* 28.2*   Recent Labs     06/26/19 1814 06/28/19 1829 06/29/19 0402  NA 130* 132* 135  K 4.2 4.0 4.0  CL 99 102 107  CO2 19* 22 20*  GLUCOSE 167* 222* 147*  BUN 17 19 15   CREATININE 1.22 0.99 0.88  CALCIUM 8.6* 8.4* 8.0*   Recent Labs    06/29/19 0402  INR 1.1   No results for input(s): LABURIN in the last 72 hours. Results for orders placed or performed during the hospital encounter of 06/28/19  Culture, blood (routine x 2)     Status: None (Preliminary result)   Collection Time: 06/28/19  9:50 PM   Specimen: BLOOD  Result Value Ref Range Status   Specimen Description BLOOD RIGHT ASSIST CONTROL  Final   Special Requests   Final    BOTTLES DRAWN AEROBIC AND ANAEROBIC Blood Culture adequate volume   Culture   Final    NO GROWTH < 12 HOURS Performed at [facility], [address] [place], [place] [address]    Report Status PENDING  Incomplete  Culture, blood (routine x 2)     Status: None (Preliminary result)   Collection Time: 06/28/19  9:50 PM   Specimen: BLOOD  Result Value Ref Range Status   Specimen Description BLOOD LEFT ASSIST CONTROL  Final   Special Requests   Final    BOTTLES DRAWN AEROBIC AND ANAEROBIC Blood Culture adequate volume   Culture   Final    NO GROWTH < 12 HOURS Performed at [facility], [address], [place], [place] [address]    Report Status PENDING  Incomplete     Radiologic Imaging: CT personally reviewed CT abd  Result Date: 06/28/2019 CLINICAL DATA:  Sepsis, ureteral stent displacement, lower abdominal pain +positive blood cultures EXAM: CT ABDOMEN AND PELVIS WITH CONTRAST TECHNIQUE: Multidetector CT imaging of the abdomen and pelvis was performed using the standard protocol following bolus administration of intravenous contrast. CONTRAST:  150mL OMNIPAQUE IOHEXOL 350 MG/ML SOLN COMPARISON:  CT abdomen pelvis 04/17/2011 FINDINGS: Lower chest: Normal heart size. No pericardial effusion. Aortic valve replacement is seen. Extensive  calcifications of the coronary arteries. Lung bases are clear aside from some atelectatic change. Hepatobiliary: Stable 1.2 cm low-attenuation lesion in segment 4A is unchanged from priors and probably reflects a benign hepatic cysts. Additional subcentimeter hypoattenuating focus in segment 2 is too small to fully characterize but remains statistically likely benign. No worrisome up attic lesions. Smooth hepatic surface contour. There is some mild gallbladder wall thickening without visible calcified gallstones or biliary ductal dilatation. No significant pericholecystic fluid is seen. Pancreas: Unremarkable. No pancreatic ductal dilatation or surrounding inflammatory changes. Spleen: Normal in size without focal abnormality. Adrenals/Urinary Tract: Normal adrenal glands. Kidneys enhance and excrete symmetrically. For multiple residual right-sided calculi, largest in the lower pole measuring up to 1.6 cm in size. A larger staghorn calculus previously seen in the right renal pelvis is no longer evident with a double-J nephroureteral stent in place position proximally in the renal pelvis and distally within the bladder. Some residual right parapelvic and proximal periureteral hazy stranding is present similar to comparison  study. Stable 5.9 cm fluid attenuation cyst in the interpolar left kidney. No bladder calculi or debris. No significant bladder wall abnormality. Stomach/Bowel: Small hiatal hernia. Distal stomach and duodenum are unremarkable. No small bowel dilatation or wall thickening. The appendix is surgically absent. No colonic dilatation or wall thickening. Vascular/Lymphatic: Atherosclerotic plaque within the normal caliber aorta. Additional calcifications in the branch vessels. Some reactive adenopathy in the right retroperitoneum likely related to recent stenting procedure and presumed lithotripsy. Reproductive: The prostate and seminal vesicles are unremarkable. Other: No abdominopelvic free fluid or  free gas. No bowel containing hernias. Musculoskeletal: Multilevel degenerative changes are present in the imaged portions of the spine. No acute osseous abnormality or suspicious osseous lesion. Post sternotomy changes in the chest partially visualized. IMPRESSION: 1. Interval placement of a right nephroureteral stent terminating in appropriate positioning within the right renal pelvis and urinary bladder. Resolution of the previously seen large staghorn calculus in the right renal pelvis compatible with lithotripsy. Residual right parapelvic and proximal periureteral hazy stranding is nonspecific but similar to comparison study. 2. Few residual nonobstructing calculi present in the right kidney. 3. Mild gallbladder wall thickening without visible calcified gallstones or biliary ductal dilatation. If there is concern for acute cholecystitis, recommend further evaluation with right upper quadrant ultrasound. 4.  Aortic Atherosclerosis (ICD10-I70.0). 5. Prior sternotomy and aortic valve replacement. Electronically Signed   By: Lovena Le M.D.   On: 06/28/2019 21:57    Impression/Assessment:  73 y.o. male status post second stage right ureteroscopy 12/4 which was uneventful.  Admitted with malaise and Enterococcus bacteremia.  CT shows the stent in good position.  There are residual calcifications in the lower pole however unable to tell if this is settled dust from the prior procedure.  Recommendation:  No urgent or additional intervention required at this time.  Continue IV antibiotics per ID.  Will have him follow-up with Dr. Diamantina [place] to discuss stent removal versus additional ureteroscopy.  06/29/2019, 8:13 AM  John Giovanni,  MD

## 2019-06-29 NOTE — Plan of Care (Signed)
Patient on IV antibiotics and IVF, no fever or issues throughout shift. Good appetite.  A&O x4. Independent with mobility. Son at bedside today and was updated on POC.Will continue to monitor. [place], [id number]

## 2019-06-29 NOTE — Progress Notes (Signed)
Pt stated he hasn't worn a CPAP in years. He stated he was told he didn't need it anymore and does not have one at home . He declined using one during this visit. CPAP no longer in the room. RN aware.

## 2019-06-29 NOTE — Progress Notes (Signed)
Initial Nutrition Assessment  DOCUMENTATION CODES:   Obesity unspecified  INTERVENTION:   Glucerna Shake po TID, each supplement provides 220 kcal and 10 grams of protein  NUTRITION DIAGNOSIS:   Inadequate oral intake related to acute illness as evidenced by per patient/family report.  GOAL:   Patient will meet greater than or equal to 90% of their needs  MONITOR:   PO intake, Supplement acceptance, Labs, Weight trends, Skin, I & O's  REASON FOR ASSESSMENT:   Malnutrition Screening Tool    ASSESSMENT:   73 y.o. male with medical history significant for type 2 diabetes mellitus, CAD, hyperlipidemia, hypertension, CABG and history of aortic valve replacement who was recently treated for staghorn calculus and Enterococcus bacteremia.  Patient recently had staged ureteroscopy on 11/13 and had a second stage ureteroscopy, laser lithotripsy and stent exchange on 12/14 which was completed uneventfully.  Patient returned (12/14) due to fever and malaise and was found to have bacteremia  RD working remotely.  Pt with decreased appetite and oral intake for several days pta. Pt continues to have decreased appetite and oral intake in hospital. Pt is drinking Glucerna supplements. Would consider liberalizing pt's diet if oral intake does not improve. Per chart, pt is weight stable pta.    Medications reviewed and include: lovenox, insulin, protonix, NaCl @75ml /hr, ampicillin, ceftriaxone   Labs reviewed: K 4.0 wnl, P 3.0 wnl, Mg 1.9 wnl Hgb 9.3(L), Hct 28.2(L) cbgs- 170, 252 x 24 hrs AIC 8.0(H)- 12/16  Unable to complete Nutrition-Focused physical exam at this time.   Diet Order:   Diet Order            Diet Heart Room service appropriate? Yes; Fluid consistency: Thin  Diet effective now             EDUCATION NEEDS:   No education needs have been identified at this time  Skin:  Skin Assessment: Reviewed RN Assessment  Last BM:  12/15  Height:   Ht Readings from Last  1 Encounters:  06/26/19 5\' 9"  (1.753 m)    Weight:   Wt Readings from Last 1 Encounters:  06/26/19 111.1 kg    Ideal Body Weight:  72.7 kg  BMI:  There is no height or weight on file to calculate BMI.  Estimated Nutritional Needs:   Kcal:  2100-2400kcal/day  Protein:  105-120g/day  Fluid:  >2.2L/day  Koleen Distance MS, RD, LDN Pager #- [phone] Office#- [phone] After Hours Pager: [phone]

## 2019-06-30 ENCOUNTER — Ambulatory Visit: Payer: Medicare HMO

## 2019-06-30 ENCOUNTER — Inpatient Hospital Stay (HOSPITAL_COMMUNITY)
Admit: 2019-06-30 | Discharge: 2019-06-30 | Disposition: A | Discharge status: Home / Self Care | Payer: Medicare HMO | Attending: Infectious Diseases | Admitting: Infectious Diseases

## 2019-06-30 ENCOUNTER — Other Ambulatory Visit: Payer: Self-pay | Admitting: Radiology

## 2019-06-30 DIAGNOSIS — I340 Nonrheumatic mitral (valve) insufficiency: Secondary | ICD-10-CM

## 2019-06-30 DIAGNOSIS — Z936 Other artificial openings of urinary tract status: Secondary | ICD-10-CM

## 2019-06-30 DIAGNOSIS — N200 Calculus of kidney: Secondary | ICD-10-CM

## 2019-06-30 DIAGNOSIS — I361 Nonrheumatic tricuspid (valve) insufficiency: Secondary | ICD-10-CM

## 2019-06-30 LAB — GLUCOSE, CAPILLARY
Glucose-Capillary: 149 mg/dL — ABNORMAL HIGH (ref 70–99)
Glucose-Capillary: 210 mg/dL — ABNORMAL HIGH (ref 70–99)
Glucose-Capillary: 220 mg/dL — ABNORMAL HIGH (ref 70–99)
Glucose-Capillary: 236 mg/dL — ABNORMAL HIGH (ref 70–99)

## 2019-06-30 LAB — CULTURE, BLOOD (SINGLE)

## 2019-06-30 LAB — ECHOCARDIOGRAM COMPLETE
Height: 69 in
Weight: 3915.37 oz

## 2019-06-30 NOTE — Progress Notes (Signed)
Ch reached out to pt son who is requesting to be assigned as the HPOA for the pt. Ch spoke with the son via telephone at [phone] to discuss the possible options and that a daytime ch will f/u with him.    06/30/19 1900  Clinical Encounter Type  Visited With Family  Visit Type Other (Comment) (AD education )  Referral From Nurse  Consult/Referral To Chaplain  Spiritual Encounters  Spiritual Needs Brochure  Stress Factors  Patient Stress Factors Health changes  Family Stress Factors None identified  Advance Directives (For Healthcare)  Does Patient Have a Medical Advance Directive? No  Would patient like information on creating a medical advance directive? Yes (Inpatient - patient requests chaplain consult to create a medical advance directive)

## 2019-06-30 NOTE — Progress Notes (Signed)
Patient reported "feeling like he was forgetting to breathe" when he was sleeping and waking up trying to catch his breathe. Patient feels generalized fatigue and weakness.  Oxygenation 97% on RA. Patient states has worn a CPAP in the past but was told recently he did not need it.  Active order on chart for CPAP.  Respiratory called and will place.  Support given and patient verbalized understanding to call with other concerns or issues.

## 2019-06-30 NOTE — Progress Notes (Signed)
ID Pt says he is feeling a little better Son at bed side BP 123/62 (BP Location: Left Arm)   Pulse 88   Temp 98.1 F (36.7 C) (Oral)   Resp 19   Ht 5\' 9"  (1.753 m)   Wt 111 kg   SpO2 97%   BMI 36.14 kg/m    Chest b/l air entry [id number] Abd soft  CBC Latest Ref Rng & Units 06/29/2019 06/28/2019 06/26/2019  WBC 4.0 - 10.5 K/uL 6.2 4.7 8.1  Hemoglobin 13.0 - 17.0 g/dL 9.3(L) 10.2(L) 10.7(L)  Hematocrit 39.0 - 52.0 % 28.2(L) 29.7(L) 31.3(L)  Platelets 150 - 400 K/uL 118(L) 106(L) 105(L)    CMP Latest Ref Rng & Units 06/29/2019 06/28/2019 06/26/2019  Glucose 70 - 99 mg/dL 147(H) 222(H) 167(H)  BUN 8 - 23 mg/dL 15 19 17   Creatinine 0.61 - 1.24 mg/dL 0.88 0.99 1.22  Sodium 135 - 145 mmol/L 135 132(L) 130(L)  Potassium 3.5 - 5.1 mmol/L 4.0 4.0 4.2  Chloride 98 - 111 mmol/L 107 102 99  CO2 22 - 32 mmol/L 20(L) 22 19(L)  Calcium 8.9 - 10.3 mg/dL 8.0(L) 8.4(L) 8.6(L)  Total Protein 6.5 - 8.1 g/dL 6.5 6.8 7.6  Total Bilirubin 0.3 - 1.2 mg/dL 0.8 0.8 1.1  Alkaline Phos 38 - 126 U/L 58 65 50  AST 15 - 41 U/L 33 40 43(H)  ALT 0 - 44 U/L 36 38 45(H)    Micro 12/13 Enterococcus 12/15 NG   Impression /recommendation  Enterococcus bacteremia likely from urinary system with a rt renal stone and stent in place He is on amp+ ceftriaxone because of prosthetic valve and concern for endocarditis 2 d echo Ok Will discuss with Dr.Gollan regarding TEE Duration of antibiotic will depend on that. Also will discuss with urology regarding stent removal as OP while on IV antibiotics.   Discussed the management with patient and son

## 2019-06-30 NOTE — Progress Notes (Signed)
*  PRELIMINARY RESULTS* Echocardiogram 2D Echocardiogram has been performed.  Sherrie Sport 06/30/2019, 11:58 AM

## 2019-06-30 NOTE — TOC Initial Note (Signed)
Transition of Care (TOC) - Initial/Assessment Note    Patient Details  Name: Jonathan Cross. MRN: [id number] Date of Birth: May 30, 1946  Transition of [facility]) CM/SW Contact:    Shelbie Ammons, RN Phone Number: 06/30/2019, 11:07 AM  Clinical Narrative:     RN CM assessment completed. Patient lying in bed in no acute distress, appears stated age and reports to feeling "ok" this morning. Patient reports that he has been dealing with this stone for the last couple of months and in fact did not realize he had a stone until he developed an infection in his blood stream. Reports being sent to hospital by urologist. Patient reports at this time he is unsure what plan is as he has not seen urology since he got here.   Lives in single family home with wife and adult daughter. Normally drives and works for himself every day. Denies any needs in the home and reports that if he does have needs his family is there to assist him.   Encouraged patient that should anything come up he can ask nurse for [place] to come see him.             Expected Discharge Plan: Home/Self Care Barriers to Discharge: Continued Medical Work up   Patient Goals and CMS Choice Patient states their goals for this hospitalization and ongoing recovery are:: to get back home and get back to work      Expected Discharge Plan and Services Expected Discharge Plan: Home/Self Care   Discharge Planning Services: CM Consult   Living arrangements for the past 2 months: Single Family Home                                      Prior Living Arrangements/Services Living arrangements for the past 2 months: Single Family Home Lives with:: Spouse, Adult Children Patient language and need for interpreter reviewed:: Yes Do you feel safe going back to the place where you live?: Yes      Need for Family Participation in Patient Care: Yes (Comment) Care giver support system in place?: Yes (comment)   Criminal  Activity/Legal Involvement Pertinent to Current Situation/Hospitalization: No - Comment as needed  Activities of Daily Living Home Assistive Devices/Equipment: Walker (specify type), Shower chair without back, Grab bars in shower, Grab bars around toilet ADL Screening (condition at time of admission) Patient's cognitive ability adequate to safely complete daily activities?: Yes Is the patient deaf or have difficulty hearing?: No Does the patient have difficulty seeing, even when wearing glasses/contacts?: No Does the patient have difficulty concentrating, remembering, or making decisions?: No Patient able to express need for assistance with ADLs?: Yes Does the patient have difficulty dressing or bathing?: No Independently performs ADLs?: Yes (appropriate for developmental age) Does the patient have difficulty walking or climbing stairs?: Yes Weakness of Legs: Both Weakness of Arms/Hands: None  Permission Sought/Granted                  Emotional Assessment Appearance:: Appears stated age Attitude/Demeanor/Rapport: Engaged Affect (typically observed): Appropriate Orientation: : Oriented to Self, Oriented to Place, Oriented to  Time, Oriented to Situation   Psych Involvement: No (comment)  Admission diagnosis:  Bacteremia [R78.81] Kidney stone [N20.0] Suspected endocarditis [R09.89] Patient Active Problem List   Diagnosis Date Noted  . Hypoalbuminemia 06/29/2019  . Hyperglycemia 06/29/2019  . Lactic acidosis 06/29/2019  . GERD (gastroesophageal reflux  disease) 06/29/2019  . 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 Nonrheumatic aortic valve stenosis 05/13/2019  . Hyperlipidemia LDL goal <70 05/13/2019  . Preop cardiovascular exam 05/13/2019  . Bacteremia 04/18/2019  . Class 2 severe obesity due to excess calories with serious comorbidity and body mass index (BMI) of 38.0 to 38.9 in adult ([place]) 03/24/2018  . History of aortic valve replacement with bioprosthetic valve  08/07/2016  . Status post coronary artery bypass graft 08/07/2016  . Coronary artery disease of native artery of native heart with stable angina pectoris ([place]) 02/11/2016  . SVT (supraventricular tachycardia) ([place]) 07/31/2015  . Type 2 diabetes mellitus without complication, with long-term current use of insulin ([place]) 03/28/2015  . Benign prostatic hyperplasia 04/07/2014  . OSA (obstructive sleep apnea) 04/07/2014  . Essential hypertension 05/16/2009   PCP:  Georges Mouse, MD Pharmacy:   [facility] [address] ([address]), [place]) [place] [address] Phone: [phone] Fax: [place], [place] - [place] [facility] [address] [place] [address] Phone: [phone] Fax: [phone]     Social Determinants of Health ([place]) Interventions    Readmission Risk Interventions Readmission Risk Prevention Plan 06/30/2019  Transportation Screening Complete  HRI or Home Care Consult Patient refused  Social Work Consult for [place] Planning/Counseling Complete  Palliative Care Screening Not Applicable  Medication Review Press photographer) Complete  Some recent data might be hidden

## 2019-06-30 NOTE — Progress Notes (Signed)
Urology Inpatient Progress Note  Subjective: Jonathan Cross. is a 73 y.o. male admitted on 06/28/2019 with Enterococcus bacteremia with recent history of staged ureteroscopy for management of a right staghorn calculus, ureteral stent in place.   Urine culture negative, blood culture pending with no growth at 2 days.  On antibiotics as below.  Today, patient reports feeling well with some intermittent right flank pain.  Anti-infectives: Anti-infectives (From admission, onward)   Start     Dose/Rate Route Frequency Ordered Stop   06/29/19 2330  gentamicin (GARAMYCIN) 130 mg in dextrose 5 % 50 mL IVPB  Status:  Discontinued     1.5 mg/kg  86.9 kg (Adjusted) 106.5 mL/hr over 30 Minutes Intravenous Every 12 hours 06/28/19 2316 06/29/19 0050   06/29/19 1000  cefTRIAXone (ROCEPHIN) 2 g in sodium chloride 0.9 % 100 mL IVPB     2 g 200 mL/hr over 30 Minutes Intravenous Every 12 hours 06/29/19 0050     06/29/19 0100  ampicillin (OMNIPEN) 2 g in sodium chloride 0.9 % 100 mL IVPB     2 g 300 mL/hr over 20 Minutes Intravenous Every 4 hours 06/29/19 0050     06/28/19 2315  gentamicin (GARAMYCIN) 130 mg in dextrose 5 % 50 mL IVPB  Status:  Discontinued     1.5 mg/kg  86.9 kg (Adjusted) 106.5 mL/hr over 30 Minutes Intravenous Every 12 hours 06/28/19 2133 06/28/19 2316   06/28/19 2145  ceFEPIme (MAXIPIME) 2 g in sodium chloride 0.9 % 100 mL IVPB     2 g 200 mL/hr over 30 Minutes Intravenous  Once 06/28/19 2133 06/28/19 2229   06/28/19 2145  vancomycin (VANCOCIN) IVPB 1000 mg/200 mL premix     1,000 mg 200 mL/hr over 60 Minutes Intravenous  Once 06/28/19 2133 06/28/19 2326   06/28/19 2145  vancomycin (VANCOCIN) 1,500 mg in sodium chloride 0.9 % 500 mL IVPB     1,500 mg 250 mL/hr over 120 Minutes Intravenous  Once 06/28/19 2135 06/29/19 0031   06/28/19 2130  vancomycin (VANCOCIN) 1,500 mg in sodium chloride 0.9 % 500 mL IVPB  Status:  Discontinued     1,500 mg 250 mL/hr over 120 Minutes  Intravenous  Once 06/28/19 2118 06/28/19 2124      Current Facility-Administered Medications  Medication Dose Route Frequency Provider Last Rate Last Admin  . 0.9 %  sodium chloride infusion   Intravenous PRN Adefeso, Oladapo, DO 10 mL/hr at 06/29/19 1049 Restarted at 06/29/19 1049  . 0.9 %  sodium chloride infusion   Intravenous Continuous Ralene Muskrat B, MD 75 mL/hr at 06/30/19 1508 Rate Verify at 06/30/19 1508  . acetaminophen (TYLENOL) tablet 650 mg  650 mg Oral Q6H PRN Adefeso, Oladapo, DO      . ampicillin (OMNIPEN) 2 g in sodium chloride 0.9 % 100 mL IVPB  2 g Intravenous Q4H Ravishankar, Joellyn Quails, MD 300 mL/hr at 06/30/19 1743 2 g at 06/30/19 1743  . cefTRIAXone (ROCEPHIN) 2 g in sodium chloride 0.9 % 100 mL IVPB  2 g Intravenous Q12H Tsosie Billing, MD   Stopped at 06/30/19 1108  . enoxaparin (LOVENOX) injection 40 mg  40 mg Subcutaneous Q24H Adefeso, Oladapo, DO   40 mg at 06/30/19 0049  . feeding supplement (GLUCERNA SHAKE) (GLUCERNA SHAKE) liquid 237 mL  237 mL Oral TID BM Adefeso, Oladapo, DO   237 mL at 06/30/19 1438  . insulin aspart (novoLOG) injection 0-5 Units  0-5 Units Subcutaneous QHS Adefeso, Oladapo, DO  2 Units at 06/29/19 2146  . insulin aspart (novoLOG) injection 0-9 Units  0-9 Units Subcutaneous TID WC Adefeso, Oladapo, DO   3 Units at 06/30/19 1740  . pantoprazole (PROTONIX) EC tablet 40 mg  40 mg Oral Daily Adefeso, Oladapo, DO   40 mg at 06/30/19 1010     Objective: Vital signs in last 24 hours: Temp:  [97.6 F (36.4 C)-98.2 F (36.8 C)] 98.2 F (36.8 C) (12/17 1511) Pulse Rate:  [91-94] 94 (12/17 1511) Resp:  [18-20] 18 (12/17 1511) BP: (117-180)/(58-79) 180/77 (12/17 1511) SpO2:  [96 %-99 %] 99 % (12/17 1511)  Intake/Output from previous day: 12/16 0701 - 12/17 0700 In: 3374.9 [P.O.:1520; I.V.:854.9; IV Piggyback:1000] Out: 3250 [Urine:3250] Intake/Output this shift: Total I/O In: 2395.4 [P.O.:880; I.V.:1015.4; IV Piggyback:500] Out:  -   Physical Exam Vitals and nursing note reviewed.  Constitutional:      General: He is not in acute distress.    Appearance: He is not ill-appearing, toxic-appearing or diaphoretic.  HENT:     Head: Normocephalic and atraumatic.  Pulmonary:     Effort: Pulmonary effort is normal. No respiratory distress.  Skin:    General: Skin is warm and dry.  Neurological:     Mental Status: He is alert and oriented to person, place, and time.  Psychiatric:        Mood and Affect: Mood normal.        Behavior: Behavior normal.    Lab Results:  Recent Labs    06/28/19 1829 06/29/19 0402  WBC 4.7 6.2  HGB 10.2* 9.3*  HCT 29.7* 28.2*  PLT 106* 118*   BMET Recent Labs    06/28/19 1829 06/29/19 0402  NA 132* 135  K 4.0 4.0  CL 102 107  CO2 22 20*  GLUCOSE 222* 147*  BUN 19 15  CREATININE 0.99 0.88  CALCIUM 8.4* 8.0*   PT/INR Recent Labs    06/29/19 0402  LABPROT 13.9  INR 1.1   Assessment & Plan: 73 year old male admitted with Enterococcus bacteremia with recent history of stage ureteroscopy for management of a right staghorn calculus.  On broad-spectrum antibiotics with repeat cultures preliminary negative.  Patient complaining of right flank pain, likely stent discomfort.  Agree with antibiotics.  Planning for ureteroscopy with Dr. Diamantina Providence on 1228.  Debroah Loop, PA-C 06/30/2019

## 2019-06-30 NOTE — Progress Notes (Signed)
PROGRESS NOTE    Jonathan Cross.  [id number] DOB: Aug 04, 1945 DOA: 06/28/2019 PCP: Georges Mouse, MD    Brief Narrative:  73 y.o. male with medical history significant for type 2 diabetes mellitus, CAD, hyperlipidemia, hypertension, CABG and history of aortic valve replacement who was recently treated for staghorn calculus and Enterococcus bacteremia.  Patient recently had staged ureteroscopy on 11/13 and had a second stage ureteroscopy, laser lithotripsy and stent exchange on 12/14 which was completed uneventfully.  Patient returned yesterday (12/14) due to fever and malaise at home that has been ongoing for about a week, he also complained of decreased energy and was started on antibiotics (Augmentin) with blood culture which resulted today to be positive for Enterococcus, patient returned to the ED today because he continues to feel weak with no improvement.  12/16: Patient continues to endorse severe fatigue and weakness  12/17: Patient endorses some improvement in his fatigue.   Assessment & Plan:   Active Problems:   Bacteremia   Essential hypertension   OSA (obstructive sleep apnea)   Type 2 diabetes mellitus without complication, with long-term current use of insulin (HCC)   Hyperlipidemia LDL goal <70   Hypoalbuminemia   Hyperglycemia   Lactic acidosis   GERD (gastroesophageal reflux disease)   Staghorn renal calculus  Enterococcus faecalis bacteremia Sepsis secondary to above Patient presented to the emergency department due to fever and malaise, he was discharged with antibiotics, blood culture done at that time resulted with positive Enterococcus.  Patient was treated with IV vancomycin and cefepime  Infectious disease specialist (Dr. Ramon Dredge) consulted and recommended ampicillin and ceftriaxone.   Repeat cultures from 12/15 no growth to date Plan: Continue empiric ampicillin and ceftriaxone Follow ID recommendations Per ID patient may need stent  removal prior to discontinuation of IV antibiotics.  He will follow up with urology  Recent ureteroscopy due to staghorn calculi CT abdomen and pelvis showed interval placement of right nephroureteral stent with resolution of the previously seen large staghorn calculus in the right renal pelvis compatible with lithotripsy. Dr. Bernardo Heater consulted for recommendation regarding acute bacteremia s/p ureteroscopy and lithotripsy.  -No surgical intervention recommended at this time per urology -Outpatient stone removal ideally prior to IV antibiotic discontinuation  Hyperglycemia secondary to type 2 diabetes mellitus Continue insulin sliding scale and hypoglycemia protocol Glipizide will be held at this time  Hypoalbuminemia possible secondary to moderate protein calorie malnutrition Albumin 2.9; protein supplements will be provided  Lactic acidosis resolved 2.3> 1.6  Essential hypertension/hyperlipidemia Continue home meds when med rec is updated  GERD Continue PPI  Obstructive sleep apnea Continue CPAP   DVT prophylaxis: Lovenox Code Status: Full Family Communication: Friend at bedside  disposition Plan: Anticipate home pending clinical improvement   Consultants:   Infectious disease  Urology  Procedures: None Antimicrobials:   Ampicillin (12/15 - )  Rocephin (12/15- )   Subjective: Seen and examined No acute overnight events No new complaints Continues to endorse fatigue  Objective: Vitals:   06/29/19 2034 06/30/19 0526 06/30/19 0950 06/30/19 1017  BP: (!) 117/58 (!) 145/79    Pulse: 91 94    Resp: 20 20  18   Temp: 97.9 F (36.6 C) 97.6 F (36.4 C)    TempSrc:      SpO2: 96% 99% 97% 98%  Weight:      Height:        Intake/Output Summary (Last 24 hours) at 06/30/2019 1413 Last data filed at 06/30/2019 1042 Gross per 24  hour  Intake 3223.08 ml  Output 2650 ml  Net 573.08 ml   Filed Weights   06/29/19 1410  Weight: 111 kg     Examination:  General exam: Appears calm and comfortable  Respiratory system: Clear to auscultation. Respiratory effort normal. Cardiovascular system: S1 & S2 heard, RRR. No JVD, murmurs, rubs, gallops or clicks. No pedal edema. Gastrointestinal system: Abdomen is nondistended, soft and nontender. No organomegaly or masses felt. Normal bowel sounds heard. Central nervous system: Alert and oriented. No focal neurological deficits. Extremities: Symmetric 5 x 5 power. Skin: No rashes, lesions or ulcers Psychiatry: Judgement and insight appear normal. Mood & affect appropriate.     Data Reviewed: I have personally reviewed following labs and imaging studies  CBC: Recent Labs  Lab 06/26/19 1814 06/28/19 1829 06/29/19 0402  WBC 8.1 4.7 6.2  NEUTROABS 5.8 2.2  --   HGB 10.7* 10.2* 9.3*  HCT 31.3* 29.7* 28.2*  MCV 98.1 98.7 102.5*  PLT 105* 106* 123456*   Basic Metabolic Panel: Recent Labs  Lab 06/26/19 1814 06/28/19 1829 06/29/19 0402  NA 130* 132* 135  K 4.2 4.0 4.0  CL 99 102 107  CO2 19* 22 20*  GLUCOSE 167* 222* 147*  BUN 17 19 15   CREATININE 1.22 0.99 0.88  CALCIUM 8.6* 8.4* 8.0*  MG  --   --  1.9  PHOS  --   --  3.0   GFR: Estimated Creatinine Clearance: 89 mL/min (by C-G formula based on SCr of 0.88 mg/dL). Liver Function Tests: Recent Labs  Lab 06/26/19 1814 06/28/19 1829 06/29/19 0402  AST 43* 40 33  ALT 45* 38 36  ALKPHOS 50 65 58  BILITOT 1.1 0.8 0.8  PROT 7.6 6.8 6.5  ALBUMIN 3.6 3.2* 2.9*   No results for input(s): LIPASE, AMYLASE in the last 168 hours. No results for input(s): AMMONIA in the last 168 hours. Coagulation Profile: Recent Labs  Lab 06/29/19 0402  INR 1.1   Cardiac Enzymes: No results for input(s): CKTOTAL, CKMB, CKMBINDEX, TROPONINI in the last 168 hours. BNP (last 3 results) No results for input(s): PROBNP in the last 8760 hours. HbA1C: Recent Labs    06/29/19 0402  HGBA1C 8.0*   CBG: Recent Labs  Lab  06/29/19 1141 06/29/19 1658 06/29/19 2141 06/30/19 0736 06/30/19 1130  GLUCAP 252* 190* 225* 149* 210*   Lipid Profile: No results for input(s): CHOL, HDL, LDLCALC, TRIG, CHOLHDL, LDLDIRECT in the last 72 hours. Thyroid Function Tests: No results for input(s): TSH, T4TOTAL, FREET4, T3FREE, THYROIDAB in the last 72 hours. Anemia Panel: No results for input(s): VITAMINB12, FOLATE, FERRITIN, TIBC, IRON, RETICCTPCT in the last 72 hours. Sepsis Labs: Recent Labs  Lab 06/26/19 1814 06/28/19 1831 06/28/19 2029  LATICACIDVEN 2.1* 2.3* 1.6    Recent Results (from the past 240 hour(s))  Blood culture (single)     Status: Abnormal   Collection Time: 06/26/19  6:14 PM   Specimen: BLOOD  Result Value Ref Range Status   Specimen Description   Final    BLOOD RIGHT ANTECUBITAL Performed at [facility], [place] [address] [place], [place] [address]    Special Requests   Final    BOTTLES DRAWN AEROBIC AND ANAEROBIC Blood Culture results may not be optimal due to an excessive volume of blood received in culture bottles Performed at [facility], [address], [place], [place] [address]    Culture  Setup Time   Final    GRAM POSITIVE  COCCI IN BOTH AEROBIC AND ANAEROBIC BOTTLES CRITICAL RESULT CALLED TO, READ BACK BY AND VERIFIED WITH: Romie Minus AT 1805 06/27/2019  TFK Performed at [facility], [address] [place] [place], [place] [address]    Culture ENTEROCOCCUS FAECALIS (A)  Final   Report Status 06/30/2019 FINAL  Final   Organism ID, Bacteria ENTEROCOCCUS FAECALIS  Final      Susceptibility   Enterococcus faecalis - MIC*    AMPICILLIN <=2 SENSITIVE Sensitive     VANCOMYCIN 1 SENSITIVE Sensitive     GENTAMICIN SYNERGY SENSITIVE Sensitive     * ENTEROCOCCUS FAECALIS  Urine culture     Status: None   Collection Time: 06/26/19  6:14 PM   Specimen: Urine, Random  Result Value Ref Range Status   Specimen Description   Final    URINE, RANDOM Performed at  [facility], [address] [place], [place] [address]    Special Requests   Final    NONE Performed at [facility], [address] [place], [place] [address]    Culture   Final    NO GROWTH Performed at [facility], [place] [address] [place], [place] [address]    Report Status 06/28/2019 FINAL  Final  Culture, blood (routine x 2)     Status: None (Preliminary result)   Collection Time: 06/28/19  9:50 PM   Specimen: BLOOD  Result Value Ref Range Status   Specimen Description BLOOD RIGHT ASSIST CONTROL  Final   Special Requests   Final    BOTTLES DRAWN AEROBIC AND ANAEROBIC Blood Culture adequate volume   Culture   Final    NO GROWTH 2 DAYS Performed at [facility], [address] [place], [place] [address]    Report Status PENDING  Incomplete  Culture, blood (routine x 2)     Status: None (Preliminary result)   Collection Time: 06/28/19  9:50 PM   Specimen: BLOOD  Result Value Ref Range Status   Specimen Description BLOOD LEFT ASSIST CONTROL  Final   Special Requests   Final    BOTTLES DRAWN AEROBIC AND ANAEROBIC Blood Culture adequate volume   Culture   Final    NO GROWTH 2 DAYS Performed at [facility], [address] [place], [place] [address]    Report Status PENDING  Incomplete  SARS CORONAVIRUS 2 (TAT 6-24 HRS) Nasopharyngeal Nasopharyngeal Swab     Status: None   Collection Time: 06/28/19  9:50 PM   Specimen: Nasopharyngeal Swab  Result Value Ref Range Status   SARS Coronavirus 2 NEGATIVE NEGATIVE Final    Comment: (NOTE) SARS-CoV-2 target nucleic acids are NOT DETECTED. The SARS-CoV-2 RNA is generally detectable in upper and lower respiratory specimens during the acute phase of infection. Negative results do not preclude SARS-CoV-2 infection, do not rule out co-infections with other pathogens, and should not be used as the sole basis for treatment or other patient management decisions. Negative results must be  combined with clinical observations, patient history, and epidemiological information. The expected result is Negative. Fact Sheet for Patients: SugarRoll.be Fact Sheet for Healthcare Providers: https://www.woods-mathews.com/ This test is not yet approved or cleared by the [place] FDA and  has been authorized for detection and/or diagnosis of SARS-CoV-2 by FDA under an Emergency Use Authorization (EUA). This EUA will remain  in effect (meaning this test can be used) for the duration of the COVID-19 declaration under Section 56 4(b)(1) of the Act, 21 U.S.C. section 360bbb-3(b)(1),  unless the authorization is terminated or revoked sooner. Performed at [facility], [place] [address], [place], [place] [address]   Urine culture     Status: None   Collection Time: 06/28/19 11:27 PM   Specimen: Urine, Random  Result Value Ref Range Status   Specimen Description   Final    URINE, RANDOM Performed at [facility], [address] [place], [place] [address]    Special Requests   Final    NONE Performed at [facility], [address] [place] [address] [place], [place] [address]    Culture   Final    NO GROWTH Performed at [facility], [place] [address] [place], [place] [address]    Report Status 06/29/2019 FINAL  Final         Radiology Studies: CT abd  Result Date: 06/28/2019 CLINICAL DATA:  Sepsis, ureteral stent displacement, lower abdominal pain +positive blood cultures EXAM: CT ABDOMEN AND PELVIS WITH CONTRAST TECHNIQUE: Multidetector CT imaging of the abdomen and pelvis was performed using the standard protocol following bolus administration of intravenous contrast. CONTRAST:  112mL OMNIPAQUE IOHEXOL 350 MG/ML SOLN COMPARISON:  CT abdomen pelvis 04/17/2011 FINDINGS: Lower chest: Normal heart size. No pericardial effusion. Aortic valve replacement is seen. Extensive calcifications of the coronary arteries. Lung bases are  clear aside from some atelectatic change. Hepatobiliary: Stable 1.2 cm low-attenuation lesion in segment 4A is unchanged from priors and probably reflects a benign hepatic cysts. Additional subcentimeter hypoattenuating focus in segment 2 is too small to fully characterize but remains statistically likely benign. No worrisome up attic lesions. Smooth hepatic surface contour. There is some mild gallbladder wall thickening without visible calcified gallstones or biliary ductal dilatation. No significant pericholecystic fluid is seen. Pancreas: Unremarkable. No pancreatic ductal dilatation or surrounding inflammatory changes. Spleen: Normal in size without focal abnormality. Adrenals/Urinary Tract: Normal adrenal glands. Kidneys enhance and excrete symmetrically. For multiple residual right-sided calculi, largest in the lower pole measuring up to 1.6 cm in size. A larger staghorn calculus previously seen in the right renal pelvis is no longer evident with a double-J nephroureteral stent in place position proximally in the renal pelvis and distally within the bladder. Some residual right parapelvic and proximal periureteral hazy stranding is present similar to comparison study. Stable 5.9 cm fluid attenuation cyst in the interpolar left kidney. No bladder calculi or debris. No significant bladder wall abnormality. Stomach/Bowel: Small hiatal hernia. Distal stomach and duodenum are unremarkable. No small bowel dilatation or wall thickening. The appendix is surgically absent. No colonic dilatation or wall thickening. Vascular/Lymphatic: Atherosclerotic plaque within the normal caliber aorta. Additional calcifications in the branch vessels. Some reactive adenopathy in the right retroperitoneum likely related to recent stenting procedure and presumed lithotripsy. Reproductive: The prostate and seminal vesicles are unremarkable. Other: No abdominopelvic free fluid or free gas. No bowel containing hernias. Musculoskeletal:  Multilevel degenerative changes are present in the imaged portions of the spine. No acute osseous abnormality or suspicious osseous lesion. Post sternotomy changes in the chest partially visualized. IMPRESSION: 1. Interval placement of a right nephroureteral stent terminating in appropriate positioning within the right renal pelvis and urinary bladder. Resolution of the previously seen large staghorn calculus in the right renal pelvis compatible with lithotripsy. Residual right parapelvic and proximal periureteral hazy stranding is nonspecific but similar to comparison study. 2. Few residual nonobstructing calculi present in the right kidney. 3. Mild gallbladder wall thickening without visible calcified gallstones or biliary ductal dilatation. If there is concern  for acute cholecystitis, recommend further evaluation with right upper quadrant ultrasound. 4.  Aortic Atherosclerosis (ICD10-I70.0). 5. Prior sternotomy and aortic valve replacement. Electronically Signed   By: Lovena Le M.D.   On: 06/28/2019 21:57        Scheduled Meds: . enoxaparin (LOVENOX) injection  40 mg Subcutaneous Q24H  . feeding supplement (GLUCERNA SHAKE)  237 mL Oral TID BM  . insulin aspart  0-5 Units Subcutaneous QHS  . insulin aspart  0-9 Units Subcutaneous TID WC  . pantoprazole  40 mg Oral Daily   Continuous Infusions: . sodium chloride 10 mL/hr at 06/29/19 1049  . sodium chloride 75 mL/hr at 06/30/19 0048  . ampicillin (OMNIPEN) IV 2 g (06/30/19 1214)  . cefTRIAXone (ROCEPHIN)  IV 2 g (06/30/19 1038)     LOS: 2 days    Time spent: 35 minutes    Sidney Ace, MD [facility] Pager [phone]  If 7PM-7AM, please contact night-coverage [email or web address] Password [email or web address] 06/30/2019, 2:13 PM

## 2019-06-30 NOTE — Progress Notes (Signed)
Patient decline CPAP QHS last night, however, patient is now requesting CPAP for daytime nap. Patient states he feels tired and would like to try wearing CPAP. RN and MD at bedside as well. Patient placed on Auto CPAP and instructed on use.

## 2019-07-01 LAB — GLUCOSE, CAPILLARY
Glucose-Capillary: 166 mg/dL — ABNORMAL HIGH (ref 70–99)
Glucose-Capillary: 239 mg/dL — ABNORMAL HIGH (ref 70–99)
Glucose-Capillary: 201 mg/dL — ABNORMAL HIGH (ref 70–99)
Glucose-Capillary: 223 mg/dL — ABNORMAL HIGH (ref 70–99)

## 2019-07-01 NOTE — Care Management Important Message (Signed)
Important Message  Patient Details  Name: Jonathan Cross. MRN: [id number] Date of Birth: 06-29-1946   Medicare Important Message Given:  Yes     Dannette Barbara 07/01/2019, 3:13 PM

## 2019-07-01 NOTE — Progress Notes (Signed)
   07/01/19 1600  Clinical Encounter Type  Visited With Family;Health care provider  Visit Type Follow-up  Referral From Chaplain   This chaplain followed up with the patient's son re:AD completion. Upon arrival, the patient was receiving treatment, but was awake and alert. The patient's son was at the bedside and he confirmed interest in helping his father to complete AD documents. This chaplain answered questions about next steps for completion and shared that we are unable to complete here at [facility] due to visitor restrictions that will impact out ability to secure witnesses. The patient's son verbalized understanding and expressed appreciation for the information and support. Their plan is to complete following discharge.

## 2019-07-01 NOTE — Progress Notes (Signed)
PROGRESS NOTE    Jonathan Cross.  [id number] DOB: 12-04-1945 DOA: 06/28/2019 PCP: Georges Mouse, MD    Brief Narrative:  73 y.o. male with medical history significant for type 2 diabetes mellitus, CAD, hyperlipidemia, hypertension, CABG and history of aortic valve replacement who was recently treated for staghorn calculus and Enterococcus bacteremia.  Patient recently had staged ureteroscopy on 11/13 and had a second stage ureteroscopy, laser lithotripsy and stent exchange on 12/14 which was completed uneventfully.  Patient returned yesterday (12/14) due to fever and malaise at home that has been ongoing for about a week, he also complained of decreased energy and was started on antibiotics (Augmentin) with blood culture which resulted today to be positive for Enterococcus, patient returned to the ED today because he continues to feel weak with no improvement.  12/16: Patient continues to endorse severe fatigue and weakness  12/17: Patient endorses some improvement in his fatigue.  12/18: Patient reports continued fatigue.  TTE no vegetation noted   Assessment & Plan:   Active Problems:   Bacteremia   Essential hypertension   OSA (obstructive sleep apnea)   Type 2 diabetes mellitus without complication, with long-term current use of insulin (HCC)   Hyperlipidemia LDL goal <70   Hypoalbuminemia   Hyperglycemia   Lactic acidosis   GERD (gastroesophageal reflux disease)   Staghorn renal calculus  Enterococcus faecalis bacteremia Sepsis secondary to above Patient presented to the emergency department due to fever and malaise, he was discharged with antibiotics, blood culture done at that time resulted with positive Enterococcus.  Patient was treated with IV vancomycin and cefepime  Infectious disease specialist (Dr. Ramon Dredge) consulted and recommended ampicillin and ceftriaxone.   Repeat cultures from 12/15 no growth to date TTE unrevealing, cardiology input  requested for need for TEE in setting of prosthetic aortic valve Urology input requested regarding timing of stent removal.  Per ID stent removal should ideally occur while patient is on IV antibiotics Plan: Continue empiric ampicillin and ceftriaxone Follow ID recommendations Cardiology input requested Urology input requested  Recent ureteroscopy due to staghorn calculi CT abdomen and pelvis showed interval placement of right nephroureteral stent with resolution of the previously seen large staghorn calculus in the right renal pelvis compatible with lithotripsy. Dr. Bernardo Heater consulted for recommendation regarding acute bacteremia s/p ureteroscopy and lithotripsy.  -No surgical intervention recommended at this time per urology -Outpatient stone removal ideally prior to IV antibiotic discontinuation  Hyperglycemia secondary to type 2 diabetes mellitus Continue insulin sliding scale and hypoglycemia protocol Glipizide will be held at this time  Hypoalbuminemia possible secondary to moderate protein calorie malnutrition Albumin 2.9; protein supplements will be provided  Lactic acidosis resolved 2.3> 1.6  Essential hypertension/hyperlipidemia Continue home meds when med rec is updated  GERD Continue PPI  Obstructive sleep apnea Continue CPAP   DVT prophylaxis: Lovenox Code Status: Full Family Communication: Son at bedside 12/17 Disposition Plan: Anticipate home pending clinical improvement   Consultants:   Infectious disease  Urology  Procedures: None Antimicrobials:   Ampicillin (12/15 - )  Rocephin (12/15- )   Subjective: Seen and examined No acute overnight events No new complaints Continues to endorse fatigue  Objective: Vitals:   06/30/19 1511 06/30/19 2041 07/01/19 0616 07/01/19 1341  BP: (!) 180/77 123/62 139/63 140/67  Pulse: 94 88 84 84  Resp: 18 19 20 12   Temp: 98.2 F (36.8 C) 98.1 F (36.7 C) (!) 97.4 F (36.3 C) 97.7 F (36.5 C)    TempSrc:  Oral Oral Oral Oral  SpO2: 99% 97% 97% 95%  Weight:      Height:        Intake/Output Summary (Last 24 hours) at 07/01/2019 1439 Last data filed at 07/01/2019 1319 Gross per 24 hour  Intake 2381.75 ml  Output 2625 ml  Net -243.25 ml   Filed Weights   06/29/19 1410  Weight: 111 kg    Examination:  General exam: Appears calm and comfortable  Respiratory system: Clear to auscultation. Respiratory effort normal. Cardiovascular system: S1 & S2 heard, RRR. No JVD, murmurs, rubs, gallops or clicks. No pedal edema. Gastrointestinal system: Abdomen is nondistended, soft and nontender. No organomegaly or masses felt. Normal bowel sounds heard. Central nervous system: Alert and oriented. No focal neurological deficits. Extremities: Symmetric 5 x 5 power. Skin: No rashes, lesions or ulcers Psychiatry: Judgement and insight appear normal. Mood & affect appropriate.     Data Reviewed: I have personally reviewed following labs and imaging studies  CBC: Recent Labs  Lab 06/26/19 1814 06/28/19 1829 06/29/19 0402  WBC 8.1 4.7 6.2  NEUTROABS 5.8 2.2  --   HGB 10.7* 10.2* 9.3*  HCT 31.3* 29.7* 28.2*  MCV 98.1 98.7 102.5*  PLT 105* 106* 123456*   Basic Metabolic Panel: Recent Labs  Lab 06/26/19 1814 06/28/19 1829 06/29/19 0402  NA 130* 132* 135  K 4.2 4.0 4.0  CL 99 102 107  CO2 19* 22 20*  GLUCOSE 167* 222* 147*  BUN 17 19 15   CREATININE 1.22 0.99 0.88  CALCIUM 8.6* 8.4* 8.0*  MG  --   --  1.9  PHOS  --   --  3.0   GFR: Estimated Creatinine Clearance: 89 mL/min (by C-G formula based on SCr of 0.88 mg/dL). Liver Function Tests: Recent Labs  Lab 06/26/19 1814 06/28/19 1829 06/29/19 0402  AST 43* 40 33  ALT 45* 38 36  ALKPHOS 50 65 58  BILITOT 1.1 0.8 0.8  PROT 7.6 6.8 6.5  ALBUMIN 3.6 3.2* 2.9*   No results for input(s): LIPASE, AMYLASE in the last 168 hours. No results for input(s): AMMONIA in the last 168 hours. Coagulation Profile: Recent Labs   Lab 06/29/19 0402  INR 1.1   Cardiac Enzymes: No results for input(s): CKTOTAL, CKMB, CKMBINDEX, TROPONINI in the last 168 hours. BNP (last 3 results) No results for input(s): PROBNP in the last 8760 hours. HbA1C: Recent Labs    06/29/19 0402  HGBA1C 8.0*   CBG: Recent Labs  Lab 06/30/19 1130 06/30/19 1647 06/30/19 2031 07/01/19 0736 07/01/19 1159  GLUCAP 210* 220* 236* 166* 239*   Lipid Profile: No results for input(s): CHOL, HDL, LDLCALC, TRIG, CHOLHDL, LDLDIRECT in the last 72 hours. Thyroid Function Tests: No results for input(s): TSH, T4TOTAL, FREET4, T3FREE, THYROIDAB in the last 72 hours. Anemia Panel: No results for input(s): VITAMINB12, FOLATE, FERRITIN, TIBC, IRON, RETICCTPCT in the last 72 hours. Sepsis Labs: Recent Labs  Lab 06/26/19 1814 06/28/19 1831 06/28/19 2029  LATICACIDVEN 2.1* 2.3* 1.6    Recent Results (from the past 240 hour(s))  Blood culture (single)     Status: Abnormal   Collection Time: 06/26/19  6:14 PM   Specimen: BLOOD  Result Value Ref Range Status   Specimen Description   Final    BLOOD RIGHT ANTECUBITAL Performed at [facility], [place] [address], [place], [place] [address]    Special Requests   Final    BOTTLES DRAWN AEROBIC AND ANAEROBIC Blood Culture results may  not be optimal due to an excessive volume of blood received in culture bottles Performed at [facility], [address] [place], [place] [address]    Culture  Setup Time   Final    GRAM POSITIVE COCCI IN BOTH AEROBIC AND ANAEROBIC BOTTLES CRITICAL RESULT CALLED TO, READ BACK BY AND VERIFIED WITH: Romie Minus AT [place] 06/27/2019  TFK Performed at [facility], [address] [place], [place] [address]    Culture ENTEROCOCCUS FAECALIS (A)  Final   Report Status 06/30/2019 FINAL  Final   Organism ID, Bacteria ENTEROCOCCUS FAECALIS  Final      Susceptibility   Enterococcus faecalis - MIC*    AMPICILLIN <=2 SENSITIVE Sensitive      VANCOMYCIN 1 SENSITIVE Sensitive     GENTAMICIN SYNERGY SENSITIVE Sensitive     * ENTEROCOCCUS FAECALIS  Urine culture     Status: None   Collection Time: 06/26/19  6:14 PM   Specimen: Urine, Random  Result Value Ref Range Status   Specimen Description   Final    URINE, RANDOM Performed at [facility], [address], [place], [place] [address]    Special Requests   Final    NONE Performed at [facility], [address] [place], [place] [address]    Culture   Final    NO GROWTH Performed at [facility], [place] [address], [place], [place] [address]    Report Status 06/28/2019 FINAL  Final  Culture, blood (routine x 2)     Status: None (Preliminary result)   Collection Time: 06/28/19  9:50 PM   Specimen: BLOOD  Result Value Ref Range Status   Specimen Description BLOOD RIGHT ASSIST CONTROL  Final   Special Requests   Final    BOTTLES DRAWN AEROBIC AND ANAEROBIC Blood Culture adequate volume   Culture   Final    NO GROWTH 3 DAYS Performed at [facility], [address], [place], [place] [address]    Report Status PENDING  Incomplete  Culture, blood (routine x 2)     Status: None (Preliminary result)   Collection Time: 06/28/19  9:50 PM   Specimen: BLOOD  Result Value Ref Range Status   Specimen Description BLOOD LEFT ASSIST CONTROL  Final   Special Requests   Final    BOTTLES DRAWN AEROBIC AND ANAEROBIC Blood Culture adequate volume   Culture   Final    NO GROWTH 3 DAYS Performed at [facility], [address] [place], [place] [address]    Report Status PENDING  Incomplete  SARS CORONAVIRUS 2 (TAT 6-24 HRS) Nasopharyngeal Nasopharyngeal Swab     Status: None   Collection Time: 06/28/19  9:50 PM   Specimen: Nasopharyngeal Swab  Result Value Ref Range Status   SARS Coronavirus 2 NEGATIVE NEGATIVE Final    Comment: (NOTE) SARS-CoV-2 target nucleic acids are NOT DETECTED. The SARS-CoV-2 RNA is generally detectable in upper  and lower respiratory specimens during the acute phase of infection. Negative results do not preclude SARS-CoV-2 infection, do not rule out co-infections with other pathogens, and should not be used as the sole basis for treatment or other patient management decisions. Negative results must be combined with clinical observations, patient history, and epidemiological information. The expected result is Negative. Fact Sheet for Patients: SugarRoll.be Fact Sheet for Healthcare Providers: https://www.woods-mathews.com/ This test is not yet approved or cleared by the [place] FDA and  has been authorized for detection and/or diagnosis  of SARS-CoV-2 by FDA under an Emergency Use Authorization (EUA). This EUA will remain  in effect (meaning this test can be used) for the duration of the COVID-19 declaration under Section 56 4(b)(1) of the Act, 21 U.S.C. section 360bbb-3(b)(1), unless the authorization is terminated or revoked sooner. Performed at [facility], [place] [address], [place], [place] [address]   Urine culture     Status: None   Collection Time: 06/28/19 11:27 PM   Specimen: Urine, Random  Result Value Ref Range Status   Specimen Description   Final    URINE, RANDOM Performed at [facility], [address], [place], [place] [address]    Special Requests   Final    NONE Performed at [facility], [address], [place], [place] [address]    Culture   Final    NO GROWTH Performed at [facility], [place] [address], [place], [place] [address]    Report Status 06/29/2019 FINAL  Final         Radiology Studies: ECHOCARDIOGRAM COMPLETE  Result Date: 06/30/2019   ECHOCARDIOGRAM REPORT   Patient Name:   Hondo Vandenheuvel [facility]. Date of Exam: 06/30/2019 Medical Rec #:  [id number]              Height:       69.0 in Accession #:    [id number]             Weight:       244.7 lb Date of Birth:  [birth year]               BSA:          2.25 m Patient Age:    63 years               BP:           N/A/N/A mmHg Patient Gender: M                      HR:           N/A bpm. Exam Location:  [facility] Procedure: Cardiac Doppler and Color Doppler Indications:     Bacteremia 790.7  History:         Patient has prior history of Echocardiogram examinations, most                  recent 04/21/2019. Prior CABG; Risk Factors:Hypertension and                  Diabetes. History of bioprosthetic Aortic valve replacement.  Sonographer:     Sherrie Sport RDCS (AE) Referring Phys:  [id number] Tsosie Billing Diagnosing Phys: Ida Rogue MD IMPRESSIONS  1. Left ventricular ejection fraction, by visual estimation, is 60 to 65%. The left ventricle has normal function. There is no left ventricular hypertrophy.  2. Left ventricular diastolic parameters are consistent with Grade I diastolic dysfunction (impaired relaxation).  3. The left ventricle has no regional wall motion abnormalities.  4. Global right ventricle has normal systolic function.The right ventricular size is normal. No increase in right ventricular wall thickness.  5. Left atrial size was normal.  6. Mild mitral valve regurgitation.  7. Tricuspid valve regurgitation is mild.  8. Mild to moderate aortic valve sclerosis/calcification without any evidence of aortic stenosis.  9. Aortic valve area, by VTI measures 1.86 cm. 10. Normal pulmonary artery systolic pressure. 11. No valve vegetation noted. FINDINGS  Left Ventricle: Left ventricular  ejection fraction, by visual estimation, is 60 to 65%. The left ventricle has normal function. The left ventricle has no regional wall motion abnormalities. There is no left ventricular hypertrophy. Left ventricular diastolic parameters are consistent with Grade I diastolic dysfunction (impaired relaxation). Normal left atrial pressure. Right Ventricle: The right ventricular size is normal. No increase in right ventricular wall thickness. Global RV  systolic function is has normal systolic function. The tricuspid regurgitant velocity is 1.97 m/s, and with an assumed right atrial pressure  of 10 mmHg, the estimated right ventricular systolic pressure is normal at 25.5 mmHg. Left Atrium: Left atrial size was normal in size. Right Atrium: Right atrial size was normal in size Pericardium: There is no evidence of pericardial effusion. Mitral Valve: The mitral valve is normal in structure. Mild mitral valve regurgitation. No evidence of mitral valve stenosis by observation. Tricuspid Valve: The tricuspid valve is normal in structure. Tricuspid valve regurgitation is mild. Aortic Valve: The aortic valve is normal in structure. Aortic valve regurgitation is not visualized. Mild to moderate aortic valve sclerosis/calcification is present, without any evidence of aortic stenosis. Aortic valve mean gradient measures 11.0 mmHg.  Aortic valve peak gradient measures 21.5 mmHg. Aortic valve area, by VTI measures 1.86 cm. Pulmonic Valve: The pulmonic valve was normal in structure. Pulmonic valve regurgitation is not visualized. Pulmonic regurgitation is not visualized. Aorta: The aortic root, ascending aorta and aortic arch are all structurally normal, with no evidence of dilitation or obstruction. Venous: The inferior vena cava is normal in size with greater than 50% respiratory variability, suggesting right atrial pressure of 3 mmHg. IAS/Shunts: No atrial level shunt detected by color flow Doppler. There is no evidence of a patent foramen ovale. No ventricular septal defect is seen or detected. There is no evidence of an atrial septal defect.  LEFT VENTRICLE PLAX 2D LVIDd:         4.06 cm  Diastology LVIDs:         2.56 cm  LV e' lateral:   8.27 cm/s LV PW:         1.29 cm  LV E/e' lateral: 9.8 LV IVS:        1.24 cm  LV e' medial:    5.00 cm/s LVOT diam:     2.00 cm  LV E/e' medial:  16.2 LV SV:         49 ml LV SV Index:   20.62 LVOT Area:     3.14 cm  RIGHT VENTRICLE RV  Basal diam:  3.65 cm RV S prime:     12.70 cm/s TAPSE (M-mode): 3.8 cm LEFT ATRIUM             Index       RIGHT ATRIUM           Index LA diam:        4.20 cm 1.87 cm/m  RA Area:     20.30 cm LA Vol (A2C):   41.5 ml 18.44 ml/m RA Volume:   54.70 ml  24.30 ml/m LA Vol (A4C):   52.4 ml 23.28 ml/m LA Biplane Vol: 48.6 ml 21.59 ml/m  AORTIC VALVE                    PULMONIC VALVE AV Area (Vmax):    1.56 cm     PV Vmax:        1.05 m/s AV Area (Vmean):   1.78 cm     PV Peak grad:  4.4 mmHg AV Area (VTI):     1.86 cm     RVOT Peak grad: 4 mmHg AV Vmax:           232.00 cm/s AV Vmean:          147.333 cm/s AV VTI:            0.434 m AV Peak Grad:      21.5 mmHg AV Mean Grad:      11.0 mmHg LVOT Vmax:         115.00 cm/s LVOT Vmean:        83.300 cm/s LVOT VTI:          0.257 m LVOT/AV VTI ratio: 0.59  AORTA Ao Root diam: 2.70 cm MITRAL VALVE                         TRICUSPID VALVE MV Area (PHT): 3.08 cm              TR Peak grad:   15.5 mmHg MV PHT:        71.34 msec            TR Vmax:        197.00 cm/s MV Decel Time: 246 msec MV E velocity: 81.00 cm/s  103 cm/s  SHUNTS MV A velocity: 105.00 cm/s 70.3 cm/s Systemic VTI:  0.26 m MV E/A ratio:  0.77        1.5       Systemic Diam: 2.00 cm  Ida Rogue MD Electronically signed by Ida Rogue MD Signature Date/Time: 06/30/2019/2:58:20 PM    Final         Scheduled Meds: . enoxaparin (LOVENOX) injection  40 mg Subcutaneous Q24H  . feeding supplement (GLUCERNA SHAKE)  237 mL Oral TID BM  . insulin aspart  0-5 Units Subcutaneous QHS  . insulin aspart  0-9 Units Subcutaneous TID WC  . pantoprazole  40 mg Oral Daily   Continuous Infusions: . sodium chloride 10 mL/hr at 06/29/19 1049  . sodium chloride 75 mL/hr at 07/01/19 0928  . ampicillin (OMNIPEN) IV 2 g (07/01/19 1420)  . cefTRIAXone (ROCEPHIN)  IV 2 g (07/01/19 1217)     LOS: 3 days    Time spent: 35 minutes    Sidney Ace, MD [facility] Pager [phone]  If  7PM-7AM, please contact night-coverage [email or web address] Password [facility] 07/01/2019, 2:39 PM

## 2019-07-01 NOTE — Progress Notes (Signed)
   07/01/19 1100  Clinical Encounter Type  Visited With Patient;Health care provider  Visit Type Follow-up  Referral From Chaplain  Stress Factors  Patient Stress Factors Health changes   Chaplain received a referral to assist the patient with creating an AD. Upon arrival, the patient was sitting up in bed watching television. He reported that he was feeling better than when he arrived, but not yet back to normal. The patient expressed that he is a person of [place] and he went through great care to share many of the instances when he had experienced encounters with the Divine and answers to his prayers for the needs of others. The patient expressed that he knows that God loves him and that he is held and deeply cared for, particularly during this time of uncertainty in his health. The patient reported that in some ways he "wants to complete the document, but in others, he does not." The patient had some uncertainty around the function of the HPOA document vs. General Power of [employer]. This chaplain explained that the HPOA document only addresses his wishes as far as his biological property, which seemed to give the patient some relief. Chaplain will follow up with the patient's son via phone or in person today.

## 2019-07-01 NOTE — Progress Notes (Signed)
ID Pt feeling tired Still passing fragments of stone No flank pain No fever appetite improving  Patient Vitals for the past 24 hrs:  BP Temp Temp src Pulse Resp SpO2  07/01/19 1341 140/67 97.7 F (36.5 C) Oral 84 12 95 %  07/01/19 0616 139/63 (!) 97.4 F (36.3 C) Oral 84 20 97 %  06/30/19 2041 123/62 98.1 F (36.7 C) Oral 88 19 97 %    Awake and alert Chest b/lair entry Hss1s2  ABd soft   Labs CBC Latest Ref Rng & Units 06/29/2019 06/28/2019 06/26/2019  WBC 4.0 - 10.5 K/uL 6.2 4.7 8.1  Hemoglobin 13.0 - 17.0 g/dL 9.3(L) 10.2(L) 10.7(L)  Hematocrit 39.0 - 52.0 % 28.2(L) 29.7(L) 31.3(L)  Platelets 150 - 400 K/uL 118(L) 106(L) 105(L)    CMP Latest Ref Rng & Units 06/29/2019 06/28/2019 06/26/2019  Glucose 70 - 99 mg/dL 147(H) 222(H) 167(H)  BUN 8 - 23 mg/dL 15 19 17   Creatinine 0.61 - 1.24 mg/dL 0.88 0.99 1.22  Sodium 135 - 145 mmol/L 135 132(L) 130(L)  Potassium 3.5 - 5.1 mmol/L 4.0 4.0 4.2  Chloride 98 - 111 mmol/L 107 102 99  CO2 22 - 32 mmol/L 20(L) 22 19(L)  Calcium 8.9 - 10.3 mg/dL 8.0(L) 8.4(L) 8.6(L)  Total Protein 6.5 - 8.1 g/dL 6.5 6.8 7.6  Total Bilirubin 0.3 - 1.2 mg/dL 0.8 0.8 1.1  Alkaline Phos 38 - 126 U/L 58 65 50  AST 15 - 41 U/L 33 40 43(H)  ALT 0 - 44 U/L 36 38 45(H)    Micro 12/13 BC- I set both bottles enterococcus faecalis 12/15 culture neg   Impression/recommendation  Enterococcus bacteremia - recurrent due to the renal stone and stent Because of prosthetic valve and unclear duration of bacteremia will need TEE to r/o endocarditis. Currently on ampicillin and ceftriaxone. IF no endocarditis then ceftriaxone can be discontinued.  Discussed with urologist regarding further intervention while on antibiotic- Pt will have stone removed on 07/11/19 and stent will be removed a week after that.  Discussed the management with him, his son and Dr.Gollan TEE on Monday- NPO after midnight Sunday.

## 2019-07-02 LAB — GLUCOSE, CAPILLARY
Glucose-Capillary: 167 mg/dL — ABNORMAL HIGH (ref 70–99)
Glucose-Capillary: 218 mg/dL — ABNORMAL HIGH (ref 70–99)
Glucose-Capillary: 246 mg/dL — ABNORMAL HIGH (ref 70–99)
Glucose-Capillary: 309 mg/dL — ABNORMAL HIGH (ref 70–99)

## 2019-07-02 NOTE — Progress Notes (Signed)
PROGRESS NOTE    Jonathan Cross.  MC:[id number] DOB: 17-Aug-1945 DOA: 06/28/2019 PCP: Georges Mouse, MD    Brief Narrative:  73 y.o. male with medical history significant for type 2 diabetes mellitus, CAD, hyperlipidemia, hypertension, CABG and history of aortic valve replacement who was recently treated for staghorn calculus and Enterococcus bacteremia.  Patient recently had staged ureteroscopy on 11/13 and had a second stage ureteroscopy, laser lithotripsy and stent exchange on 12/14 which was completed uneventfully.  Patient returned yesterday (12/14) due to fever and malaise at home that has been ongoing for about a week, he also complained of decreased energy and was started on antibiotics (Augmentin) with blood culture which resulted today to be positive for Enterococcus, patient returned to the ED today because he continues to feel weak with no improvement.  12/16: Patient continues to endorse severe fatigue and weakness  12/17: Patient endorses some improvement in his fatigue.  12/18: Patient reports continued fatigue.  TTE no vegetation noted  12/19: Patient continues to endorse fatigue.  Infectious disease communicated with cardiology as well as urology.  Current plan of care includes maintenance of IV antibiotics.  Plan for transesophageal echo on Monday 1221.  Plan for urologic stone removal on 12/28 and stent retrieval a week after that   Assessment & Plan:   Active Problems:   Bacteremia   Essential hypertension   OSA (obstructive sleep apnea)   Type 2 diabetes mellitus without complication, with long-term current use of insulin (HCC)   Hyperlipidemia LDL goal <70   Hypoalbuminemia   Hyperglycemia   Lactic acidosis   GERD (gastroesophageal reflux disease)   Staghorn renal calculus  Enterococcus faecalis bacteremia Sepsis secondary to above Patient presented to the emergency department due to fever and malaise, he was discharged with antibiotics, blood  culture done at that time resulted with positive Enterococcus.  Patient was treated with IV vancomycin and cefepime  Infectious disease specialist (Dr. Ramon Dredge) consulted and recommended ampicillin and ceftriaxone.   Repeat cultures from 12/15 no growth to date TTE unrevealing, cardiology input requested for need for TEE in setting of prosthetic aortic valve Urology input requested regarding timing of stent removal.  Per ID stent removal should ideally occur while patient is on IV antibiotics Plan: Continue empiric ampicillin and ceftriaxone Follow ID recommendations Cardiology input requested Urology input requested Plan for TEE Monday, n.p.o. after midnight Sunday, if TEE negative for vegetation can discontinue ceftriaxone Plan to maintain IV antibiotics until removal of stent  Recent ureteroscopy due to staghorn calculi CT abdomen and pelvis showed interval placement of right nephroureteral stent with resolution of the previously seen large staghorn calculus in the right renal pelvis compatible with lithotripsy. Dr. Bernardo Heater consulted for recommendation regarding acute bacteremia s/p ureteroscopy and lithotripsy.  - See above  Hyperglycemia secondary to type 2 diabetes mellitus Continue insulin sliding scale and hypoglycemia protocol Glipizide will be held at this time  Hypoalbuminemia possible secondary to moderate protein calorie malnutrition Albumin 2.9; protein supplements will be provided  Lactic acidosis resolved 2.3> 1.6  Essential hypertension/hyperlipidemia Continue home meds when med rec is updated  GERD Continue PPI  Obstructive sleep apnea Continue CPAP   DVT prophylaxis: Lovenox Code Status: Full Family Communication: Son at bedside 12/17 Disposition Plan: Anticipate home pending clinical improvement   Consultants:   Infectious disease  Urology  Procedures: None Antimicrobials:   Ampicillin (12/15 - )  Rocephin (12/15-  )   Subjective: Seen and examined No acute overnight events No  new complaints Continues to endorse fatigue  Objective: Vitals:   07/01/19 0616 07/01/19 1341 07/01/19 2025 07/02/19 0608  BP: 139/63 140/67 135/70 116/78  Pulse: 84 84 83 88  Resp: 20 12 19 18   Temp: (!) 97.4 F (36.3 C) 97.7 F (36.5 C) 97.7 F (36.5 C) 98.1 F (36.7 C)  TempSrc: Oral Oral Oral   SpO2: 97% 95% 96% 96%  Weight:      Height:        Intake/Output Summary (Last 24 hours) at 07/02/2019 1430 Last data filed at 07/02/2019 1300 Gross per 24 hour  Intake 4404.5 ml  Output 1400 ml  Net 3004.5 ml   Filed Weights   06/29/19 1410  Weight: 111 kg    Examination:  General exam: Appears calm and comfortable  Respiratory system: Clear to auscultation. Respiratory effort normal. Cardiovascular system: S1 & S2 heard, RRR. No JVD, murmurs, rubs, gallops or clicks. No pedal edema. Gastrointestinal system: Abdomen is nondistended, soft and nontender. No organomegaly or masses felt. Normal bowel sounds heard. Central nervous system: Alert and oriented. No focal neurological deficits. Extremities: Symmetric 5 x 5 power. Skin: No rashes, lesions or ulcers Psychiatry: Judgement and insight appear normal. Mood & affect appropriate.     Data Reviewed: I have personally reviewed following labs and imaging studies  CBC: Recent Labs  Lab 06/26/19 1814 06/28/19 1829 06/29/19 0402  WBC 8.1 4.7 6.2  NEUTROABS 5.8 2.2  --   HGB 10.7* 10.2* 9.3*  HCT 31.3* 29.7* 28.2*  MCV 98.1 98.7 102.5*  PLT 105* 106* [id number]*   Basic Metabolic Panel: Recent Labs  Lab 06/26/19 1814 06/28/19 1829 06/29/19 0402  NA 130* 132* 135  K 4.2 4.0 4.0  CL 99 102 107  CO2 19* 22 20*  GLUCOSE 167* 222* 147*  BUN 17 19 15   CREATININE 1.22 0.99 0.88  CALCIUM 8.6* 8.4* 8.0*  MG  --   --  1.9  PHOS  --   --  3.0   GFR: Estimated Creatinine Clearance: 89 mL/min (by C-G formula based on SCr of 0.88 mg/dL). Liver Function  Tests: Recent Labs  Lab 06/26/19 1814 06/28/19 1829 06/29/19 0402  AST 43* 40 33  ALT 45* 38 36  ALKPHOS 50 65 58  BILITOT 1.1 0.8 0.8  PROT 7.6 6.8 6.5  ALBUMIN 3.6 3.2* 2.9*   No results for input(s): LIPASE, AMYLASE in the last 168 hours. No results for input(s): AMMONIA in the last 168 hours. Coagulation Profile: Recent Labs  Lab 06/29/19 0402  INR 1.1   Cardiac Enzymes: No results for input(s): CKTOTAL, CKMB, CKMBINDEX, TROPONINI in the last 168 hours. BNP (last 3 results) No results for input(s): PROBNP in the last 8760 hours. HbA1C: No results for input(s): HGBA1C in the last 72 hours. CBG: Recent Labs  Lab 07/01/19 1159 07/01/19 1647 07/01/19 2144 07/02/19 0737 07/02/19 1200  GLUCAP 239* 201* 223* 167* 218*   Lipid Profile: No results for input(s): CHOL, HDL, LDLCALC, TRIG, CHOLHDL, LDLDIRECT in the last 72 hours. Thyroid Function Tests: No results for input(s): TSH, T4TOTAL, FREET4, T3FREE, THYROIDAB in the last 72 hours. Anemia Panel: No results for input(s): VITAMINB12, FOLATE, FERRITIN, TIBC, IRON, RETICCTPCT in the last 72 hours. Sepsis Labs: Recent Labs  Lab 06/26/19 1814 06/28/19 1831 06/28/19 2029  LATICACIDVEN 2.1* 2.3* 1.6    Recent Results (from the past 240 hour(s))  Blood culture (single)     Status: Abnormal   Collection Time: 06/26/19  6:14 PM  Specimen: BLOOD  Result Value Ref Range Status   Specimen Description   Final    BLOOD RIGHT ANTECUBITAL Performed at [facility], [place] [address], [place], [place] [address]    Special Requests   Final    BOTTLES DRAWN AEROBIC AND ANAEROBIC Blood Culture results may not be optimal due to an excessive volume of blood received in culture bottles Performed at [facility], [address], [place], [place] [address]    Culture  Setup Time   Final    GRAM POSITIVE COCCI IN BOTH AEROBIC AND ANAEROBIC BOTTLES CRITICAL RESULT CALLED TO, READ BACK BY AND VERIFIED WITH: Romie Minus AT 1805 06/27/2019  TFK Performed at [facility], [place] [place], [place] [address]    Culture ENTEROCOCCUS FAECALIS (A)  Final   Report Status 06/30/2019 FINAL  Final   Organism ID, Bacteria ENTEROCOCCUS FAECALIS  Final      Susceptibility   Enterococcus faecalis - MIC*    AMPICILLIN <=2 SENSITIVE Sensitive     VANCOMYCIN 1 SENSITIVE Sensitive     GENTAMICIN SYNERGY SENSITIVE Sensitive     * ENTEROCOCCUS FAECALIS  Urine culture     Status: None   Collection Time: 06/26/19  6:14 PM   Specimen: Urine, Random  Result Value Ref Range Status   Specimen Description   Final    URINE, RANDOM Performed at [facility], [address] [place], [place] [address]    Special Requests   Final    NONE Performed at [facility], [address], [place], [place] [address]    Culture   Final    NO GROWTH Performed at [facility], [place] [address] [place], [place] [address]    Report Status 06/28/2019 FINAL  Final  Culture, blood (routine x 2)     Status: None (Preliminary result)   Collection Time: 06/28/19  9:50 PM   Specimen: BLOOD  Result Value Ref Range Status   Specimen Description BLOOD RIGHT ASSIST CONTROL  Final   Special Requests   Final    BOTTLES DRAWN AEROBIC AND ANAEROBIC Blood Culture adequate volume   Culture   Final    NO GROWTH 4 DAYS Performed at [facility], [address], [place], [place] [address]    Report Status PENDING  Incomplete  Culture, blood (routine x 2)     Status: None (Preliminary result)   Collection Time: 06/28/19  9:50 PM   Specimen: BLOOD  Result Value Ref Range Status   Specimen Description BLOOD LEFT ASSIST CONTROL  Final   Special Requests   Final    BOTTLES DRAWN AEROBIC AND ANAEROBIC Blood Culture adequate volume   Culture   Final    NO GROWTH 4 DAYS Performed at [facility], [address] [place], [place] [address]    Report Status PENDING  Incomplete  SARS  CORONAVIRUS 2 (TAT 6-24 HRS) Nasopharyngeal Nasopharyngeal Swab     Status: None   Collection Time: 06/28/19  9:50 PM   Specimen: Nasopharyngeal Swab  Result Value Ref Range Status   SARS Coronavirus 2 NEGATIVE NEGATIVE Final    Comment: (NOTE) SARS-CoV-2 target nucleic acids are NOT DETECTED. The SARS-CoV-2 RNA is generally detectable in upper and lower respiratory specimens during the acute phase of infection. Negative results do not preclude SARS-CoV-2 infection, do not rule out co-infections with other pathogens, and should not be used as the sole basis for treatment or other  patient management decisions. Negative results must be combined with clinical observations, patient history, and epidemiological information. The expected result is Negative. Fact Sheet for Patients: SugarRoll.be Fact Sheet for Healthcare Providers: https://www.woods-mathews.com/ This test is not yet approved or cleared by the [place] FDA and  has been authorized for detection and/or diagnosis of SARS-CoV-2 by FDA under an Emergency Use Authorization (EUA). This EUA will remain  in effect (meaning this test can be used) for the duration of the COVID-19 declaration under Section 56 4(b)(1) of the Act, 21 U.S.C. section 360bbb-3(b)(1), unless the authorization is terminated or revoked sooner. Performed at [facility], [place] [address] [place], [place] [address]   Urine culture     Status: None   Collection Time: 06/28/19 11:27 PM   Specimen: Urine, Random  Result Value Ref Range Status   Specimen Description   Final    URINE, RANDOM Performed at [facility], [address], [place], [place] [address]    Special Requests   Final    NONE Performed at [facility], [address], [place], [place] [address]    Culture   Final    NO GROWTH Performed at [facility], [place] [address], [place], [place] [address]    Report Status  06/29/2019 FINAL  Final         Radiology Studies: No results found.      Scheduled Meds: . enoxaparin (LOVENOX) injection  40 mg Subcutaneous Q24H  . feeding supplement (GLUCERNA SHAKE)  237 mL Oral TID BM  . insulin aspart  0-5 Units Subcutaneous QHS  . insulin aspart  0-9 Units Subcutaneous TID WC  . pantoprazole  40 mg Oral Daily   Continuous Infusions: . sodium chloride 10 mL/hr at 06/29/19 1049  . sodium chloride Stopped (07/02/19 1205)  . ampicillin (OMNIPEN) IV Stopped (07/02/19 1015)  . cefTRIAXone (ROCEPHIN)  IV 200 mL/hr at 07/02/19 1207     LOS: 4 days    Time spent: 35 minutes    Sidney Ace, MD [facility] Pager [phone]  If 7PM-7AM, please contact night-coverage [email or web address] Password [email or web address] 07/02/2019, 2:30 PM

## 2019-07-03 LAB — CULTURE, BLOOD (ROUTINE X 2)
Culture: NO GROWTH
Culture: NO GROWTH
Special Requests: ADEQUATE
Special Requests: ADEQUATE

## 2019-07-03 LAB — GLUCOSE, CAPILLARY
Glucose-Capillary: 190 mg/dL — ABNORMAL HIGH (ref 70–99)
Glucose-Capillary: 229 mg/dL — ABNORMAL HIGH (ref 70–99)
Glucose-Capillary: 277 mg/dL — ABNORMAL HIGH (ref 70–99)
Glucose-Capillary: 266 mg/dL — ABNORMAL HIGH (ref 70–99)

## 2019-07-03 NOTE — Progress Notes (Signed)
PROGRESS NOTE    Jonathan Cross.  [id number] DOB: Oct 26, 1945 DOA: 06/28/2019 PCP: Georges Mouse, MD    Brief Narrative:  73 y.o. male with medical history significant for type 2 diabetes mellitus, CAD, hyperlipidemia, hypertension, CABG and history of aortic valve replacement who was recently treated for staghorn calculus and Enterococcus bacteremia.  Patient recently had staged ureteroscopy on 11/13 and had a second stage ureteroscopy, laser lithotripsy and stent exchange on 12/14 which was completed uneventfully.  Patient returned yesterday (12/14) due to fever and malaise at home that has been ongoing for about a week, he also complained of decreased energy and was started on antibiotics (Augmentin) with blood culture which resulted today to be positive for Enterococcus, patient returned to the ED today because he continues to feel weak with no improvement.  12/16: Patient continues to endorse severe fatigue and weakness  12/17: Patient endorses some improvement in his fatigue.  12/18: Patient reports continued fatigue.  TTE no vegetation noted  12/19: Patient continues to endorse fatigue.  Infectious disease communicated with cardiology as well as urology.  Current plan of care includes maintenance of IV antibiotics.  Plan for transesophageal echo on Monday 1221.  Plan for urologic stone removal on 12/28 and stent retrieval a week after that  12/20: Patient seen and examined.  Edema improved.  Plan for TEE in-house tomorrow   Assessment & Plan:   Active Problems:   Bacteremia   Essential hypertension   OSA (obstructive sleep apnea)   Type 2 diabetes mellitus without complication, with long-term current use of insulin (HCC)   Hyperlipidemia LDL goal <70   Hypoalbuminemia   Hyperglycemia   Lactic acidosis   GERD (gastroesophageal reflux disease)   Staghorn renal calculus  Enterococcus faecalis bacteremia Sepsis secondary to above Patient presented to the  emergency department due to fever and malaise, he was discharged with antibiotics, blood culture done at that time resulted with positive Enterococcus.  Patient was treated with IV vancomycin and cefepime  Infectious disease specialist (Dr. Ramon Dredge) consulted and recommended ampicillin and ceftriaxone.   Repeat cultures from 12/15 no growth to date TTE unrevealing, cardiology input requested for need for TEE in setting of prosthetic aortic valve Urology input requested regarding timing of stent removal.  Per ID stent removal should ideally occur while patient is on IV antibiotics Plan: Continue empiric ampicillin and ceftriaxone Follow ID recommendations Cardiology input requested Urology input requested Plan for TEE Monday, n.p.o. after midnight tonight if TEE negative for vegetation can discontinue ceftriaxone Plan to maintain IV antibiotics until removal of stent  Recent ureteroscopy due to staghorn calculi CT abdomen and pelvis showed interval placement of right nephroureteral stent with resolution of the previously seen large staghorn calculus in the right renal pelvis compatible with lithotripsy. Dr. Bernardo Heater consulted for recommendation regarding acute bacteremia s/p ureteroscopy and lithotripsy.  - See above  Hyperglycemia secondary to type 2 diabetes mellitus Continue insulin sliding scale and hypoglycemia protocol Glipizide will be held at this time  Hypoalbuminemia possible secondary to moderate protein calorie malnutrition Albumin 2.9; protein supplements will be provided  Lactic acidosis resolved 2.3> 1.6  Essential hypertension/hyperlipidemia Continue home meds when med rec is updated  GERD Continue PPI  Obstructive sleep apnea Continue CPAP   DVT prophylaxis: Lovenox Code Status: Full Family Communication: Son at bedside 12/17 Disposition Plan: Anticipate home pending clinical improvement   Consultants:   Infectious  disease  Urology  Procedures: None Antimicrobials:   Ampicillin (12/15 - )  Rocephin (12/15- )   Subjective: Seen and examined No acute overnight events No new complaints Fatigue improved  Objective: Vitals:   07/02/19 2008 07/02/19 2024 07/03/19 0455 07/03/19 1159  BP:  (!) 145/71 (!) 156/68 (!) 117/52  Pulse: 98 76 73 86  Resp: 18 18 20    Temp:  (!) 97.5 F (36.4 C) 97.6 F (36.4 C) 98.6 F (37 C)  TempSrc:  Axillary Oral Oral  SpO2: 99% 99% 99% 97%  Weight:      Height:        Intake/Output Summary (Last 24 hours) at 07/03/2019 1329 Last data filed at 07/03/2019 0840 Gross per 24 hour  Intake 2176.6 ml  Output 1300 ml  Net 876.6 ml   Filed Weights   06/29/19 1410  Weight: 111 kg    Examination:  General exam: Appears calm and comfortable  Respiratory system: Clear to auscultation. Respiratory effort normal. Cardiovascular system: S1 & S2 heard, RRR. No JVD, murmurs, rubs, gallops or clicks. No pedal edema. Gastrointestinal system: Abdomen is nondistended, soft and nontender. No organomegaly or masses felt. Normal bowel sounds heard. Central nervous system: Alert and oriented. No focal neurological deficits. Extremities: Symmetric 5 x 5 power. Skin: No rashes, lesions or ulcers Psychiatry: Judgement and insight appear normal. Mood & affect appropriate.     Data Reviewed: I have personally reviewed following labs and imaging studies  CBC: Recent Labs  Lab 06/26/19 1814 06/28/19 1829 06/29/19 0402  WBC 8.1 4.7 6.2  NEUTROABS 5.8 2.2  --   HGB 10.7* 10.2* 9.3*  HCT 31.3* 29.7* 28.2*  MCV 98.1 98.7 102.5*  PLT 105* 106* 123456*   Basic Metabolic Panel: Recent Labs  Lab 06/26/19 1814 06/28/19 1829 06/29/19 0402  NA 130* 132* 135  K 4.2 4.0 4.0  CL 99 102 107  CO2 19* 22 20*  GLUCOSE 167* 222* 147*  BUN 17 19 15   CREATININE 1.22 0.99 0.88  CALCIUM 8.6* 8.4* 8.0*  MG  --   --  1.9  PHOS  --   --  3.0   GFR: Estimated Creatinine  Clearance: 89 mL/min (by C-G formula based on SCr of 0.88 mg/dL). Liver Function Tests: Recent Labs  Lab 06/26/19 1814 06/28/19 1829 06/29/19 0402  AST 43* 40 33  ALT 45* 38 36  ALKPHOS 50 65 58  BILITOT 1.1 0.8 0.8  PROT 7.6 6.8 6.5  ALBUMIN 3.6 3.2* 2.9*   No results for input(s): LIPASE, AMYLASE in the last 168 hours. No results for input(s): AMMONIA in the last 168 hours. Coagulation Profile: Recent Labs  Lab 06/29/19 0402  INR 1.1   Cardiac Enzymes: No results for input(s): CKTOTAL, CKMB, CKMBINDEX, TROPONINI in the last 168 hours. BNP (last 3 results) No results for input(s): PROBNP in the last 8760 hours. HbA1C: No results for input(s): HGBA1C in the last 72 hours. CBG: Recent Labs  Lab 07/02/19 1200 07/02/19 1645 07/02/19 2040 07/03/19 0812 07/03/19 1157  GLUCAP 218* 246* 309* 190* 229*   Lipid Profile: No results for input(s): CHOL, HDL, LDLCALC, TRIG, CHOLHDL, LDLDIRECT in the last 72 hours. Thyroid Function Tests: No results for input(s): TSH, T4TOTAL, FREET4, T3FREE, THYROIDAB in the last 72 hours. Anemia Panel: No results for input(s): VITAMINB12, FOLATE, FERRITIN, TIBC, IRON, RETICCTPCT in the last 72 hours. Sepsis Labs: Recent Labs  Lab 06/26/19 1814 06/28/19 1831 06/28/19 2029  LATICACIDVEN 2.1* 2.3* 1.6    Recent Results (from the past 240 hour(s))  Blood culture (single)  Status: Abnormal   Collection Time: 06/26/19  6:14 PM   Specimen: BLOOD  Result Value Ref Range Status   Specimen Description   Final    BLOOD RIGHT ANTECUBITAL Performed at [facility], [place] [address] [place], [place] [address]    Special Requests   Final    BOTTLES DRAWN AEROBIC AND ANAEROBIC Blood Culture results may not be optimal due to an excessive volume of blood received in culture bottles Performed at [facility], [address], [place], [place] [address]    Culture  Setup Time   Final    GRAM POSITIVE COCCI IN BOTH AEROBIC AND  ANAEROBIC BOTTLES CRITICAL RESULT CALLED TO, READ BACK BY AND VERIFIED WITH: Romie Minus AT 1805 06/27/2019  TFK Performed at [facility], [address] [place], [place] [address]    Culture ENTEROCOCCUS FAECALIS (A)  Final   Report Status 06/30/2019 FINAL  Final   Organism ID, Bacteria ENTEROCOCCUS FAECALIS  Final      Susceptibility   Enterococcus faecalis - MIC*    AMPICILLIN <=2 SENSITIVE Sensitive     VANCOMYCIN 1 SENSITIVE Sensitive     GENTAMICIN SYNERGY SENSITIVE Sensitive     * ENTEROCOCCUS FAECALIS  Urine culture     Status: None   Collection Time: 06/26/19  6:14 PM   Specimen: Urine, Random  Result Value Ref Range Status   Specimen Description   Final    URINE, RANDOM Performed at [facility], [address] [place], [place] [address]    Special Requests   Final    NONE Performed at [facility], [address] [place], [place] [address]    Culture   Final    NO GROWTH Performed at [facility], [place] [address], [place], [place] [address]    Report Status 06/28/2019 FINAL  Final  Culture, blood (routine x 2)     Status: None   Collection Time: 06/28/19  9:50 PM   Specimen: BLOOD  Result Value Ref Range Status   Specimen Description BLOOD RIGHT ASSIST CONTROL  Final   Special Requests   Final    BOTTLES DRAWN AEROBIC AND ANAEROBIC Blood Culture adequate volume   Culture   Final    NO GROWTH 5 DAYS Performed at [facility], [address], [place], [place] [address]    Report Status 07/03/2019 FINAL  Final  Culture, blood (routine x 2)     Status: None   Collection Time: 06/28/19  9:50 PM   Specimen: BLOOD  Result Value Ref Range Status   Specimen Description BLOOD LEFT ASSIST CONTROL  Final   Special Requests   Final    BOTTLES DRAWN AEROBIC AND ANAEROBIC Blood Culture adequate volume   Culture   Final    NO GROWTH 5 DAYS Performed at [facility], [address] [place], [place] [address]     Report Status 07/03/2019 FINAL  Final  SARS CORONAVIRUS 2 (TAT 6-24 HRS) Nasopharyngeal Nasopharyngeal Swab     Status: None   Collection Time: 06/28/19  9:50 PM   Specimen: Nasopharyngeal Swab  Result Value Ref Range Status   SARS Coronavirus 2 NEGATIVE NEGATIVE Final    Comment: (NOTE) SARS-CoV-2 target nucleic acids are NOT DETECTED. The SARS-CoV-2 RNA is generally detectable in upper and lower respiratory specimens during the acute phase of infection. Negative results do not preclude SARS-CoV-2 infection, do not rule out co-infections with other pathogens, and should not  be used as the sole basis for treatment or other patient management decisions. Negative results must be combined with clinical observations, patient history, and epidemiological information. The expected result is Negative. Fact Sheet for Patients: SugarRoll.be Fact Sheet for Healthcare Providers: https://www.woods-mathews.com/ This test is not yet approved or cleared by the [place] FDA and  has been authorized for detection and/or diagnosis of SARS-CoV-2 by FDA under an Emergency Use Authorization (EUA). This EUA will remain  in effect (meaning this test can be used) for the duration of the COVID-19 declaration under Section 56 4(b)(1) of the Act, 21 U.S.C. section 360bbb-3(b)(1), unless the authorization is terminated or revoked sooner. Performed at [facility], [place] [address] [place], [place] [address]   Urine culture     Status: None   Collection Time: 06/28/19 11:27 PM   Specimen: Urine, Random  Result Value Ref Range Status   Specimen Description   Final    URINE, RANDOM Performed at [facility], [address] [place], [place] [address]    Special Requests   Final    NONE Performed at [facility], [address], [place], [place] [address]    Culture   Final    NO GROWTH Performed at [facility], [place] [address], [place], [place] [address]    Report Status 06/29/2019 FINAL  Final         Radiology Studies: No results found.      Scheduled Meds: . enoxaparin (LOVENOX) injection  40 mg Subcutaneous Q24H  . feeding supplement (GLUCERNA SHAKE)  237 mL Oral TID BM  . insulin aspart  0-5 Units Subcutaneous QHS  . insulin aspart  0-9 Units Subcutaneous TID WC  . pantoprazole  40 mg Oral Daily   Continuous Infusions: . sodium chloride 10 mL/hr at 06/29/19 1049  . ampicillin (OMNIPEN) IV 2 g (07/03/19 1004)  . cefTRIAXone (ROCEPHIN)  IV 2 g (07/03/19 0847)     LOS: 5 days    Time spent: 35 minutes    Sidney Ace, MD [facility] Pager [phone]  If 7PM-7AM, please contact night-coverage [email or web address] Password [email or web address] 07/03/2019, 1:29 PM

## 2019-07-04 ENCOUNTER — Inpatient Hospital Stay (HOSPITAL_COMMUNITY)
Admit: 2019-07-04 | Discharge: 2019-07-04 | Disposition: A | Discharge status: Home / Self Care | Payer: Medicare HMO | Attending: Physician Assistant | Admitting: Physician Assistant

## 2019-07-04 ENCOUNTER — Other Ambulatory Visit: Payer: Self-pay | Admitting: Urology

## 2019-07-04 ENCOUNTER — Inpatient Hospital Stay
Admit: 2019-07-04 | Discharge: 2019-07-04 | Disposition: A | Discharge status: Home / Self Care | Payer: Medicare HMO | Attending: Physician Assistant | Admitting: Physician Assistant

## 2019-07-04 ENCOUNTER — Inpatient Hospital Stay: Payer: Self-pay

## 2019-07-04 ENCOUNTER — Encounter
Admission: EM | Disposition: A | Discharge status: Home / Self Care | Payer: Self-pay | Source: Home / Self Care | Attending: Internal Medicine

## 2019-07-04 DIAGNOSIS — N200 Calculus of kidney: Secondary | ICD-10-CM

## 2019-07-04 DIAGNOSIS — R7881 Bacteremia: Secondary | ICD-10-CM

## 2019-07-04 HISTORY — PX: TEE WITHOUT CARDIOVERSION: SHX5443

## 2019-07-04 LAB — GLUCOSE, CAPILLARY
Glucose-Capillary: 174 mg/dL — ABNORMAL HIGH (ref 70–99)
Glucose-Capillary: 178 mg/dL — ABNORMAL HIGH (ref 70–99)
Glucose-Capillary: 295 mg/dL — ABNORMAL HIGH (ref 70–99)
Glucose-Capillary: 294 mg/dL — ABNORMAL HIGH (ref 70–99)
Glucose-Capillary: 297 mg/dL — ABNORMAL HIGH (ref 70–99)

## 2019-07-04 SURGERY — ECHOCARDIOGRAM, TRANSESOPHAGEAL
Anesthesia: Moderate Sedation

## 2019-07-04 MED ORDER — LIDOCAINE VISCOUS HCL 2 % MT SOLN
OROMUCOSAL | Status: AC
  Filled 2019-07-04: qty 15

## 2019-07-04 MED ORDER — SODIUM CHLORIDE 0.9 % IV SOLN: INTRAVENOUS | Status: DC

## 2019-07-04 MED ORDER — MIDAZOLAM HCL 5 MG/5ML IJ SOLN
INTRAMUSCULAR | Status: AC
  Filled 2019-07-04: qty 5

## 2019-07-04 MED ORDER — MIDAZOLAM HCL 5 MG/5ML IJ SOLN
INTRAMUSCULAR | Status: AC | PRN
  Administered 2019-07-04: 2 mg via INTRAVENOUS
  Administered 2019-07-04: 1 mg via INTRAVENOUS

## 2019-07-04 MED ORDER — FENTANYL CITRATE (PF) 100 MCG/2ML IJ SOLN
INTRAMUSCULAR | Status: AC | PRN
  Administered 2019-07-04: 50 ug via INTRAVENOUS

## 2019-07-04 MED ORDER — SODIUM CHLORIDE FLUSH 0.9 % IV SOLN
INTRAVENOUS | Status: AC
  Filled 2019-07-04: qty 10

## 2019-07-04 MED ORDER — BUTAMBEN-TETRACAINE-BENZOCAINE 2-2-14 % EX AERO
INHALATION_SPRAY | CUTANEOUS | Status: AC | PRN
  Administered 2019-07-04: 1 via TOPICAL

## 2019-07-04 MED ORDER — BUTAMBEN-TETRACAINE-BENZOCAINE 2-2-14 % EX AERO
INHALATION_SPRAY | CUTANEOUS | Status: AC
  Filled 2019-07-04: qty 5

## 2019-07-04 MED ORDER — FENTANYL CITRATE (PF) 100 MCG/2ML IJ SOLN
INTRAMUSCULAR | Status: AC
  Filled 2019-07-04: qty 2

## 2019-07-04 MED ORDER — LIDOCAINE VISCOUS HCL 2 % MT SOLN
OROMUCOSAL | Status: AC | PRN
  Administered 2019-07-04: 15 mL via OROMUCOSAL

## 2019-07-04 NOTE — Care Management Important Message (Signed)
Important Message  Patient Details  Name: Jonathan Cross. MRN: [id number] Date of Birth: 04-17-1946   Medicare Important Message Given:  Yes     Dannette Barbara 07/04/2019, 11:41 AM

## 2019-07-04 NOTE — CV Procedure (Signed)
TEE was performed without complications.  It showed normal LV systolic function and normal functioning aortic valve prosthesis.  No evidence of endocarditis.  Full report to follow.  During this procedure the patient is administered a total of Versed 3 mg and Fentanyl 50 mcg to achieve and maintain moderate conscious sedation.  The patient's heart rate, blood pressure, and oxygen saturation are monitored continuously during the procedure. The period of conscious sedation is 22 minutes, of which I was present face-to-face 100% of this time.

## 2019-07-04 NOTE — Plan of Care (Signed)
Patient completed TEE. Awaiting PICC placement. No needs of issues verbalized.

## 2019-07-04 NOTE — Treatment Plan (Signed)
Diagnosis: Enterococcus bacteremia Baseline Creatinine < 1    Allergies  Allergen Reactions  . Metformin Diarrhea    Loose stools even with XR    OPAT Orders Discharge antibiotics: Ampicillin Iv 2 grams every 6 hours but can also be given as a 8grams/24 hours infusion Until 07/13/19 ( may need more depending on the urological procedure he will have on 07/11/19  [facility] Care Per Protocol:  Labs weekly while on IV antibiotics: _X_ CBC with differential  _X_ CMP  X__ Please leave PIC in place until doctor has seen patient or been notified  Fax weekly labs to [phone]  Clinic Follow Up Appt:on 07/14/19   Call [phone] to make appt

## 2019-07-04 NOTE — Progress Notes (Signed)
*  PRELIMINARY RESULTS* Echocardiogram Echocardiogram Transesophageal has been performed.  Jonathan Cross 07/04/2019, 9:06 AM

## 2019-07-04 NOTE — Progress Notes (Signed)
PROGRESS NOTE    [name]  MC:[id number] DOB: [birth year] DOA: 06/28/2019 PCP: Georges Mouse, MD    Brief Narrative:  73 y.o. male with medical history significant for type 2 diabetes mellitus, CAD, hyperlipidemia, hypertension, CABG and history of aortic valve replacement who was recently treated for staghorn calculus and Enterococcus bacteremia.  Patient recently had staged ureteroscopy on 11/13 and had a second stage ureteroscopy, laser lithotripsy and stent exchange on 12/14 which was completed uneventfully.  Patient returned yesterday (12/14) due to fever and malaise at home that has been ongoing for about a week, he also complained of decreased energy and was started on antibiotics (Augmentin) with blood culture which resulted today to be positive for Enterococcus, patient returned to the ED today because he continues to feel weak with no improvement.  12/16: Patient continues to endorse severe fatigue and weakness  12/17: Patient endorses some improvement in his fatigue.  12/18: Patient reports continued fatigue.  TTE no vegetation noted  12/19: Patient continues to endorse fatigue.  Infectious disease communicated with cardiology as well as urology.  Current plan of care includes maintenance of IV antibiotics.  Plan for transesophageal echo on Monday 1221.  Plan for urologic stone removal on 12/28 and stent retrieval a week after that  12/20: Patient seen and examined.  Edema improved.  Plan for TEE in-house tomorrow  12/21: TEE completed.  No evidence of endocarditis.  Patient sitting in bed eating.  Appears comfortable.   Assessment & Plan:   Active Problems:   Bacteremia   Essential hypertension   OSA (obstructive sleep apnea)   Type 2 diabetes mellitus without complication, with long-term current use of insulin (HCC)   Hyperlipidemia LDL goal <70   Hypoalbuminemia   Hyperglycemia   Lactic acidosis   GERD (gastroesophageal reflux disease)   Staghorn  renal calculus  Enterococcus faecalis bacteremia Sepsis secondary to above Patient presented to the emergency department due to fever and malaise, he was discharged with antibiotics, blood culture done at that time resulted with positive Enterococcus.  Patient was treated with IV vancomycin and cefepime  Infectious disease specialist (Dr. Ramon Dredge) consulted and recommended ampicillin and ceftriaxone.   Repeat cultures from 12/15 no growth to date TTE unrevealing, cardiology input requested for need for TEE in setting of prosthetic aortic valve Urology input requested regarding timing of stent removal.  Per ID stent removal should ideally occur while patient is on IV antibiotics TEE completed, no evidence of endocarditis Plan: Continue ampicillin DC Rocephin as TEE negative for vegetation Follow ID recommendations Plan to maintain IV antibiotics until removal of stent Anticipate PICC vs Midline.  Will await ID followup  Recent ureteroscopy due to staghorn calculi CT abdomen and pelvis showed interval placement of right nephroureteral stent with resolution of the previously seen large staghorn calculus in the right renal pelvis compatible with lithotripsy. Dr. Bernardo Heater consulted for recommendation regarding acute bacteremia s/p ureteroscopy and lithotripsy.  - See above  Hyperglycemia secondary to type 2 diabetes mellitus Continue insulin sliding scale and hypoglycemia protocol Glipizide will be held at this time  Hypoalbuminemia possible secondary to moderate protein calorie malnutrition Albumin 2.9; protein supplements will be provided  Lactic acidosis resolved 2.3> 1.6  Essential hypertension/hyperlipidemia Continue home meds when med rec is updated  GERD Continue PPI  Obstructive sleep apnea Continue CPAP   DVT prophylaxis: Lovenox Code Status: Full Family Communication: Son at bedside 12/17 Disposition Plan: Anticipate home pending clinical  improvement   Consultants:  Infectious disease  Urology  Procedures: None Antimicrobials:   Ampicillin (12/15 - )  Rocephin (12/15- )   Subjective: Seen and examined TEE completed, no endocarditis No new complaints Fatigue improved  Objective: Vitals:   07/04/19 0904 07/04/19 0915 07/04/19 0930 07/04/19 1240  BP: 116/63 108/63 118/75 (!) 141/66  Pulse: 97 99 91 91  Resp: 13 12 16    Temp:      TempSrc:      SpO2: 98% 98% 96% 98%  Weight:      Height:        Intake/Output Summary (Last 24 hours) at 07/04/2019 1310 Last data filed at 07/04/2019 1100 Gross per 24 hour  Intake 2610.31 ml  Output 1450 ml  Net 1160.31 ml   Filed Weights   06/29/19 1410  Weight: 111 kg    Examination:  General exam: Appears calm and comfortable  Respiratory system: Clear to auscultation. Respiratory effort normal. Cardiovascular system: S1 & S2 heard, RRR. No JVD, murmurs, rubs, gallops or clicks. No pedal edema. Gastrointestinal system: Abdomen is nondistended, soft and nontender. No organomegaly or masses felt. Normal bowel sounds heard. Central nervous system: Alert and oriented. No focal neurological deficits. Extremities: Symmetric 5 x 5 power. Skin: No rashes, lesions or ulcers Psychiatry: Judgement and insight appear normal. Mood & affect appropriate.     Data Reviewed: I have personally reviewed following labs and imaging studies  CBC: Recent Labs  Lab 06/28/19 1829 06/29/19 0402  WBC 4.7 6.2  NEUTROABS 2.2  --   HGB 10.2* 9.3*  HCT 29.7* 28.2*  MCV 98.7 102.5*  PLT 106* 123456*   Basic Metabolic Panel: Recent Labs  Lab 06/28/19 1829 06/29/19 0402  NA 132* 135  K 4.0 4.0  CL 102 107  CO2 22 20*  GLUCOSE 222* 147*  BUN 19 15  CREATININE 0.99 0.88  CALCIUM 8.4* 8.0*  MG  --  1.9  PHOS  --  3.0   GFR: Estimated Creatinine Clearance: 89 mL/min (by C-G formula based on SCr of 0.88 mg/dL). Liver Function Tests: Recent Labs  Lab 06/28/19 1829  06/29/19 0402  AST 40 33  ALT 38 36  ALKPHOS 65 58  BILITOT 0.8 0.8  PROT 6.8 6.5  ALBUMIN 3.2* 2.9*   No results for input(s): LIPASE, AMYLASE in the last 168 hours. No results for input(s): AMMONIA in the last 168 hours. Coagulation Profile: Recent Labs  Lab 06/29/19 0402  INR 1.1   Cardiac Enzymes: No results for input(s): CKTOTAL, CKMB, CKMBINDEX, TROPONINI in the last 168 hours. BNP (last 3 results) No results for input(s): PROBNP in the last 8760 hours. HbA1C: No results for input(s): HGBA1C in the last 72 hours. CBG: Recent Labs  Lab 07/03/19 1657 07/03/19 2132 07/04/19 0729 07/04/19 0955 07/04/19 1239  GLUCAP 277* 266* 174* 178* 295*   Lipid Profile: No results for input(s): CHOL, HDL, LDLCALC, TRIG, CHOLHDL, LDLDIRECT in the last 72 hours. Thyroid Function Tests: No results for input(s): TSH, T4TOTAL, FREET4, T3FREE, THYROIDAB in the last 72 hours. Anemia Panel: No results for input(s): VITAMINB12, FOLATE, FERRITIN, TIBC, IRON, RETICCTPCT in the last 72 hours. Sepsis Labs: Recent Labs  Lab 06/28/19 1831 06/28/19 2029  LATICACIDVEN 2.3* 1.6    Recent Results (from the past 240 hour(s))  Blood culture (single)     Status: Abnormal   Collection Time: 06/26/19  6:14 PM   Specimen: BLOOD  Result Value Ref Range Status   Specimen Description   Final  BLOOD RIGHT ANTECUBITAL Performed at [facility], [place] [address] [place], [place] [address]    Special Requests   Final    BOTTLES DRAWN AEROBIC AND ANAEROBIC Blood Culture results may not be optimal due to an excessive volume of blood received in culture bottles Performed at [facility], [address], [place], [place] [address]    Culture  Setup Time   Final    GRAM POSITIVE COCCI IN BOTH AEROBIC AND ANAEROBIC BOTTLES CRITICAL RESULT CALLED TO, READ BACK BY AND VERIFIED WITH: Romie Minus AT 1805 06/27/2019  TFK Performed at [facility], [address] [place],  [place] [address]    Culture ENTEROCOCCUS FAECALIS (A)  Final   Report Status 06/30/2019 FINAL  Final   Organism ID, Bacteria ENTEROCOCCUS FAECALIS  Final      Susceptibility   Enterococcus faecalis - MIC*    AMPICILLIN <=2 SENSITIVE Sensitive     VANCOMYCIN 1 SENSITIVE Sensitive     GENTAMICIN SYNERGY SENSITIVE Sensitive     * ENTEROCOCCUS FAECALIS  Urine culture     Status: None   Collection Time: 06/26/19  6:14 PM   Specimen: Urine, Random  Result Value Ref Range Status   Specimen Description   Final    URINE, RANDOM Performed at [facility], [address] [place], [place] [address]    Special Requests   Final    NONE Performed at [facility], [address] [place], [place] [address]    Culture   Final    NO GROWTH Performed at [facility], [place] [address] [place], [place] [address]    Report Status 06/28/2019 FINAL  Final  Culture, blood (routine x 2)     Status: None   Collection Time: 06/28/19  9:50 PM   Specimen: BLOOD  Result Value Ref Range Status   Specimen Description BLOOD RIGHT ASSIST CONTROL  Final   Special Requests   Final    BOTTLES DRAWN AEROBIC AND ANAEROBIC Blood Culture adequate volume   Culture   Final    NO GROWTH 5 DAYS Performed at [facility], [address], [place], [place] [address]    Report Status 07/03/2019 FINAL  Final  Culture, blood (routine x 2)     Status: None   Collection Time: 06/28/19  9:50 PM   Specimen: BLOOD  Result Value Ref Range Status   Specimen Description BLOOD LEFT ASSIST CONTROL  Final   Special Requests   Final    BOTTLES DRAWN AEROBIC AND ANAEROBIC Blood Culture adequate volume   Culture   Final    NO GROWTH 5 DAYS Performed at [facility], [address] [place], [place] [address]    Report Status 07/03/2019 FINAL  Final  SARS CORONAVIRUS 2 (TAT 6-24 HRS) Nasopharyngeal Nasopharyngeal Swab     Status: None   Collection Time: 06/28/19  9:50 PM   Specimen:  Nasopharyngeal Swab  Result Value Ref Range Status   SARS Coronavirus 2 NEGATIVE NEGATIVE Final    Comment: (NOTE) SARS-CoV-2 target nucleic acids are NOT DETECTED. The SARS-CoV-2 RNA is generally detectable in upper and lower respiratory specimens during the acute phase of infection. Negative results do not preclude SARS-CoV-2 infection, do not rule out co-infections with other pathogens, and should not be used as the sole basis for treatment or other patient management decisions. Negative results must be combined with clinical observations, patient history, and epidemiological information. The expected result is  Negative. Fact Sheet for Patients: SugarRoll.be Fact Sheet for Healthcare Providers: https://www.woods-mathews.com/ This test is not yet approved or cleared by the [place] FDA and  has been authorized for detection and/or diagnosis of SARS-CoV-2 by FDA under an Emergency Use Authorization (EUA). This EUA will remain  in effect (meaning this test can be used) for the duration of the COVID-19 declaration under Section 56 4(b)(1) of the Act, 21 U.S.C. section 360bbb-3(b)(1), unless the authorization is terminated or revoked sooner. Performed at [facility], [place] [address], [place], [place] [address]   Urine culture     Status: None   Collection Time: 06/28/19 11:27 PM   Specimen: Urine, Random  Result Value Ref Range Status   Specimen Description   Final    URINE, RANDOM Performed at [facility], [address], [place], [place] [address]    Special Requests   Final    NONE Performed at [facility], [address], [place], [place] [address]    Culture   Final    NO GROWTH Performed at [facility], [place] [address] [place], [place] [address]    Report Status 06/29/2019 FINAL  Final         Radiology Studies: No results found.      Scheduled Meds: .  butamben-tetracaine-benzocaine      . enoxaparin (LOVENOX) injection  40 mg Subcutaneous Q24H  . feeding supplement (GLUCERNA SHAKE)  237 mL Oral TID BM  . fentaNYL      . insulin aspart  0-5 Units Subcutaneous QHS  . insulin aspart  0-9 Units Subcutaneous TID WC  . lidocaine      . midazolam      . pantoprazole  40 mg Oral Daily  . sodium chloride flush       Continuous Infusions: . sodium chloride 1,000 mL (07/04/19 1021)  . ampicillin (OMNIPEN) IV Stopped (07/04/19 1048)     LOS: 6 days    Time spent: 35 minutes    Sidney Ace, MD [facility] Pager [phone]  If 7PM-7AM, please contact night-coverage [email or web address] Password [email or web address] 07/04/2019, 1:10 PM

## 2019-07-05 ENCOUNTER — Inpatient Hospital Stay: Admit: 2019-07-05 | Payer: Medicare HMO

## 2019-07-05 DIAGNOSIS — K2100 Gastro-esophageal reflux disease with esophagitis, without bleeding: Secondary | ICD-10-CM

## 2019-07-05 DIAGNOSIS — G4733 Obstructive sleep apnea (adult) (pediatric): Secondary | ICD-10-CM

## 2019-07-05 LAB — CREATININE, SERUM
Creatinine, Ser: 0.73 mg/dL (ref 0.61–1.24)
GFR calc Af Amer: >60 mL/min (ref >60)
GFR calc non Af Amer: >60 mL/min (ref >60)

## 2019-07-05 LAB — GLUCOSE, CAPILLARY: Glucose-Capillary: 265 mg/dL — ABNORMAL HIGH (ref 70–99)

## 2019-07-05 MED ORDER — CHLORHEXIDINE GLUCONATE CLOTH 2 % EX PADS
6.0000 | MEDICATED_PAD | Freq: Every day | CUTANEOUS | Status: DC
  Administered 2019-07-05: 6 via TOPICAL

## 2019-07-05 MED ORDER — AMPICILLIN IV (FOR PTA / DISCHARGE USE ONLY): 2.0000 g | Freq: Four times a day (QID) | INTRAVENOUS | 0 refills | Status: AC

## 2019-07-05 MED ORDER — SODIUM CHLORIDE 0.9% FLUSH: 10.0000 mL | INTRAVENOUS | Status: DC | PRN

## 2019-07-05 NOTE — Progress Notes (Signed)
Clifton James. to be D/C'd Home per MD order.  Discussed prescriptions and follow up appointments with the patient. Prescriptions given to patient, medication list explained in detail. Pt verbalized understanding.  Allergies as of 07/05/2019      Reactions   Metformin Diarrhea   Loose stools even with XR      Medication List    STOP taking these medications   amoxicillin-clavulanate 875-125 MG tablet Commonly known as: Augmentin   insulin aspart protamine- aspart (70-30) 100 UNIT/ML injection Commonly known as: NOVOLOG MIX 70/30   nitrofurantoin 100 MG capsule Commonly known as: MACRODANTIN     TAKE these medications   amLODipine 5 MG tablet Commonly known as: NORVASC Take 5 mg by mouth daily.   ampicillin  IVPB Inject 2 g into the vein every 6 (six) hours for 9 days. May also be administered as 8 grams/24 hour infusion Indication:  Enterococcus bacteremia Last Day of Therapy:  07/13/19 (may require more depending on the urological procedure he will have on 07/11/19 Labs - Once weekly:  CBC/D and CMP Please leave PIC in place until doctor has seen patient or been notified Fax weekly labs to [phone] Clinic follow-up appointment on 07/14/19   aspirin EC 81 MG tablet Take 81 mg by mouth 3 (three) times a week.   atorvastatin 80 MG tablet Commonly known as: LIPITOR Take 80 mg by mouth daily.   diclofenac sodium 1 % Gel Commonly known as: VOLTAREN Apply 2 g topically 4 (four) times daily as needed (pain).   glipiZIDE 2.5 MG 24 hr tablet Commonly known as: GLUCOTROL XL Take 2.5 mg by mouth daily with breakfast.   insulin aspart 100 UNIT/ML injection Commonly known as: novoLOG Inject 0-5 Units into the skin at bedtime. What changed: Another medication with the same name was removed. Continue taking this medication, and follow the directions you see here.   isosorbide mononitrate 60 MG 24 hr tablet Commonly known as: IMDUR Take 1 tablet (60 mg total) by  mouth daily.   lisinopril 20 MG tablet Commonly known as: ZESTRIL Take 1 tablet (20 mg total) by mouth daily.   loratadine 10 MG tablet Commonly known as: CLARITIN Take 10 mg by mouth daily.   metoprolol succinate 25 MG 24 hr tablet Commonly known as: TOPROL-XL Take 25 mg by mouth daily.   nitroGLYCERIN 0.4 MG SL tablet Commonly known as: NITROSTAT Place 0.4 mg under the tongue every 5 (five) minutes as needed for chest pain.   NovoLIN 70/30 ReliOn (70-30) 100 UNIT/ML injection Generic drug: insulin NPH-regular Human Inject 80-85 Units into the skin See admin instructions. Inject 85 units into the skin in the morning and 80 units in the evening   omeprazole 20 MG capsule Commonly known as: PRILOSEC Take 20 mg by mouth daily as needed (acid reflux).   oxyCODONE-acetaminophen 7.5-325 MG tablet Commonly known as: Percocet Take 1 tablet by mouth every 4 (four) hours as needed for severe pain.   Potassium Citrate 15 MEQ (1620 MG) Tbcr Take 2 tablets by mouth 2 (two) times daily. What changed: how much to take   tamsulosin 0.4 MG Caps capsule Commonly known as: Flomax Take 1 capsule (0.4 mg total) by mouth daily.            Home Infusion Instuctions  (From admission, onward)         Start     Ordered   07/05/19 0000  Home infusion instructions    Question:  Instructions  Answer:  Flushing of vascular access device: 0.9% NaCl pre/post medication administration and prn patency; Heparin 100 u/ml, 34ml for implanted ports and Heparin 10u/ml, 68ml for all other central venous catheters.   07/05/19 1019          Vitals:   07/05/19 0421 07/05/19 1155  BP: 126/70 (!) 129/116  Pulse: 87 77  Resp: 20 18  Temp: 97.7 F (36.5 C) (!) 97.3 F (36.3 C)  SpO2: 98% 97%    Skin clean, dry and intact without evidence of skin break down, no evidence of skin tears noted. IV catheter discontinued intact. Site without signs and symptoms of complications. Dressing and pressure  applied. Pt denies pain at this time. No complaints noted.  An After Visit Summary was printed and given to the patient. Patient escorted via [place], and D/C home via private auto.  Fuller Mandril, RN

## 2019-07-05 NOTE — Progress Notes (Signed)
PHARMACY CONSULT NOTE FOR:  OUTPATIENT  PARENTERAL ANTIBIOTIC THERAPY (OPAT)  Indication: Enterococcus bactermia Regimen: ampicillin 2gm IV q6h End date: Until at least 12/30   IV antibiotic discharge orders are pended. To discharging provider:  please sign these orders via discharge navigator,  Select New Orders & click on the button choice - Manage This Unsigned Work.     Thank you for allowing pharmacy to be a part of this patient's care.  Doreene Eland, PharmD, BCPS.   Work Cell: [phone] 07/05/2019 10:23 AM

## 2019-07-05 NOTE — Discharge Summary (Signed)
Physician Discharge Summary  [name] OE:[id number] DOB: Jul 30, 1945 DOA: 06/28/2019  PCP: Georges Mouse, MD  Admit date: 06/28/2019 Discharge date: 07/05/2019  Admitted From: Home Disposition: Home  Recommendations for Outpatient Follow-up:  1. Follow up with PCP in 1-2 weeks 2. Follow up with urology as directed 3. Follow up with infectious disease as directed  Home Health: Yes Equipment/Devices: PICC line (placed 07/04/2019)  Discharge Condition: Stable CODE STATUS: Full Diet recommendation: Heart healthy/carb modified  Brief/Interim Summary: 73 y.o.malewith medical history significant fortype 2 diabetes mellitus, CAD, hyperlipidemia, hypertension,CABGand history of aortic valve replacement who was recently treated for staghorncalculus and Enterococcus bacteremia. Patient recently had staged ureteroscopy on 11/13 and had a second stage ureteroscopy, laser lithotripsy and stent exchange on 12/14 which was completed uneventfully. Patient returned yesterday (12/14) due to fever and malaise at home that has been ongoing for about a week, he also complained of decreased energy and was started on antibiotics (Augmentin) with blood culture which resulted today to be positive for Enterococcus, patient returned to the ED today because hecontinues to feel weak with no improvement.  12/16: Patient continues to endorse severe fatigue and weakness  12/17: Patient endorses some improvement in his fatigue.  12/18: Patient reports continued fatigue.  TTE no vegetation noted  12/19: Patient continues to endorse fatigue.  Infectious disease communicated with cardiology as well as urology.  Current plan of care includes maintenance of IV antibiotics.  Plan for transesophageal echo on Monday 1221.  Plan for urologic stone removal on 12/28 and stent retrieval a week after that  12/20: Patient seen and examined.  Edema improved.  Plan for TEE in-house tomorrow  12/21:  TEE completed.  No evidence of endocarditis.  Patient sitting in bed eating.  Appears comfortable.  Discharge Diagnoses:  Active Problems:   Bacteremia   Essential hypertension   OSA (obstructive sleep apnea)   Type 2 diabetes mellitus without complication, with long-term current use of insulin (HCC)   Hyperlipidemia LDL goal <70   Hypoalbuminemia   Hyperglycemia   Lactic acidosis   GERD (gastroesophageal reflux disease)   Staghorn renal calculus  Enterococcus faecalis bacteremia Sepsis secondary to above Patient presented to the emergency department due to fever and malaise, he was discharged with antibiotics, blood culture done at that time resulted with positive Enterococcus.  Patient was treated with IV vancomycin and cefepime Infectious disease specialist (Dr. Ramon Dredge) consulted and recommended ampicillin and ceftriaxone. Repeat cultures from 12/15 no growth to date TTE unrevealing, cardiology input requested for need for TEE in setting of prosthetic aortic valve Urology input requested regarding timing of stent removal.  Per ID stent removal should ideally occur while patient is on IV antibiotics TEE completed, no evidence of endocarditis Continue ampicillin DC Rocephin as TEE negative for vegetation Follow ID recommendations Plan to maintain IV antibiotics until removal of stent PICC line in place PICC to remain in place until removal of stent, potentially longer at the discretion of ID  Recent ureteroscopy due to staghorn calculi CT abdomen and pelvis showed interval placement of right nephroureteral stent with resolution of the previously seen large staghorn calculus in the right renal pelvis compatible with lithotripsy. Dr.Stoioffconsulted for recommendation regarding acute bacteremias/pureteroscopy and lithotripsy.  - See above  Hyperglycemia secondary to type 2 diabetes mellitus Continue insulin sliding scale and hypoglycemia protocol Glipizide will be  held at this time Can resume home regimen on dc  Hypoalbuminemia possible secondary to moderate protein calorie malnutrition Albumin 2.9;protein supplements  will be provided Outpatient PCP/dietitian followup   Discharge Instructions  Discharge Instructions    Diet - low sodium heart healthy   Complete by: As directed    Home infusion instructions   Complete by: As directed    Instructions: Flushing of vascular access device: 0.9% NaCl pre/post medication administration and prn patency; Heparin 100 u/ml, 80ml for implanted ports and Heparin 10u/ml, 31ml for all other central venous catheters.   Increase activity slowly   Complete by: As directed      Allergies as of 07/05/2019      Reactions   Metformin Diarrhea   Loose stools even with XR      Medication List    STOP taking these medications   amoxicillin-clavulanate 875-125 MG tablet Commonly known as: Augmentin   insulin aspart protamine- aspart (70-30) 100 UNIT/ML injection Commonly known as: NOVOLOG MIX 70/30   nitrofurantoin 100 MG capsule Commonly known as: MACRODANTIN     TAKE these medications   amLODipine 5 MG tablet Commonly known as: NORVASC Take 5 mg by mouth daily.   ampicillin  IVPB Inject 2 g into the vein every 6 (six) hours for 9 days. May also be administered as 8 grams/24 hour infusion Indication:  Enterococcus bacteremia Last Day of Therapy:  07/13/19 (may require more depending on the urological procedure he will have on 07/11/19 Labs - Once weekly:  CBC/D and CMP Please leave PIC in place until doctor has seen patient or been notified Fax weekly labs to [phone] Clinic follow-up appointment on 07/14/19   aspirin EC 81 MG tablet Take 81 mg by mouth 3 (three) times a week.   atorvastatin 80 MG tablet Commonly known as: LIPITOR Take 80 mg by mouth daily.   diclofenac sodium 1 % Gel Commonly known as: VOLTAREN Apply 2 g topically 4 (four) times daily as needed (pain).   glipiZIDE  2.5 MG 24 hr tablet Commonly known as: GLUCOTROL XL Take 2.5 mg by mouth daily with breakfast.   insulin aspart 100 UNIT/ML injection Commonly known as: novoLOG Inject 0-5 Units into the skin at bedtime. What changed: Another medication with the same name was removed. Continue taking this medication, and follow the directions you see here.   isosorbide mononitrate 60 MG 24 hr tablet Commonly known as: IMDUR Take 1 tablet (60 mg total) by mouth daily.   lisinopril 20 MG tablet Commonly known as: ZESTRIL Take 1 tablet (20 mg total) by mouth daily.   loratadine 10 MG tablet Commonly known as: CLARITIN Take 10 mg by mouth daily.   metoprolol succinate 25 MG 24 hr tablet Commonly known as: TOPROL-XL Take 25 mg by mouth daily.   nitroGLYCERIN 0.4 MG SL tablet Commonly known as: NITROSTAT Place 0.4 mg under the tongue every 5 (five) minutes as needed for chest pain.   NovoLIN 70/30 ReliOn (70-30) 100 UNIT/ML injection Generic drug: insulin NPH-regular Human Inject 80-85 Units into the skin See admin instructions. Inject 85 units into the skin in the morning and 80 units in the evening   omeprazole 20 MG capsule Commonly known as: PRILOSEC Take 20 mg by mouth daily as needed (acid reflux).   oxyCODONE-acetaminophen 7.5-325 MG tablet Commonly known as: Percocet Take 1 tablet by mouth every 4 (four) hours as needed for severe pain.   Potassium Citrate 15 MEQ (1620 MG) Tbcr Take 2 tablets by mouth 2 (two) times daily. What changed: how much to take   tamsulosin 0.4 MG Caps capsule Commonly known as:  Flomax Take 1 capsule (0.4 mg total) by mouth daily.            Home Infusion Instuctions  (From admission, onward)         Start     Ordered   07/05/19 0000  Home infusion instructions    Question:  Instructions  Answer:  Flushing of vascular access device: 0.9% NaCl pre/post medication administration and prn patency; Heparin 100 u/ml, 61ml for implanted ports and Heparin  10u/ml, 13ml for all other central venous catheters.   07/05/19 1019         Follow-up Information    Croglio, Guadalupe Dawn, MD. Go on 07/19/2019.   Specialty: Internal Medicine Why: 2:30pm appointment Contact information: [address] [place] [address] [phone]        Nelva Bush, [place] on 06/18/2019.   Specialty: Cardiology Why: 11am appointment Contact information: [address] [place] [address] [phone]          Allergies  Allergen Reactions  . Metformin Diarrhea    Loose stools even with XR    Consultations:  Urology- Bernardo Heater  ID- Delaine Lame   Procedures/Studies: DG Chest 1 View  Result Date: 06/26/2019 CLINICAL DATA:  Fever EXAM: CHEST  1 VIEW COMPARISON:  04/18/2019 FINDINGS: The heart size is enlarged. The patient is status post prior median sternotomy. Aortic calcifications are noted. Surgical clips project over the left lower lung zone. There is no focal infiltrate. No pneumothorax. No significant pleural effusion. No acute osseous abnormality. IMPRESSION: No acute cardiopulmonary process. Electronically Signed   By: Constance Holster M.D.   On: 06/26/2019 19:50   CT abd  Result Date: 06/28/2019 CLINICAL DATA:  Sepsis, ureteral stent displacement, lower abdominal pain +positive blood cultures EXAM: CT ABDOMEN AND PELVIS WITH CONTRAST TECHNIQUE: Multidetector CT imaging of the abdomen and pelvis was performed using the standard protocol following bolus administration of intravenous contrast. CONTRAST:  111mL OMNIPAQUE IOHEXOL 350 MG/ML SOLN COMPARISON:  CT abdomen pelvis 04/17/2011 FINDINGS: Lower chest: Normal heart size. No pericardial effusion. Aortic valve replacement is seen. Extensive calcifications of the coronary arteries. Lung bases are clear aside from some atelectatic change. Hepatobiliary: Stable 1.2 cm low-attenuation lesion in segment 4A is unchanged from priors and probably reflects a benign hepatic  cysts. Additional subcentimeter hypoattenuating focus in segment 2 is too small to fully characterize but remains statistically likely benign. No worrisome up attic lesions. Smooth hepatic surface contour. There is some mild gallbladder wall thickening without visible calcified gallstones or biliary ductal dilatation. No significant pericholecystic fluid is seen. Pancreas: Unremarkable. No pancreatic ductal dilatation or surrounding inflammatory changes. Spleen: Normal in size without focal abnormality. Adrenals/Urinary Tract: Normal adrenal glands. Kidneys enhance and excrete symmetrically. For multiple residual right-sided calculi, largest in the lower pole measuring up to 1.6 cm in size. A larger staghorn calculus previously seen in the right renal pelvis is no longer evident with a double-J nephroureteral stent in place position proximally in the renal pelvis and distally within the bladder. Some residual right parapelvic and proximal periureteral hazy stranding is present similar to comparison study. Stable 5.9 cm fluid attenuation cyst in the interpolar left kidney. No bladder calculi or debris. No significant bladder wall abnormality. Stomach/Bowel: Small hiatal hernia. Distal stomach and duodenum are unremarkable. No small bowel dilatation or wall thickening. The appendix is surgically absent. No colonic dilatation or wall thickening. Vascular/Lymphatic: Atherosclerotic plaque within the normal caliber aorta. Additional calcifications in the branch vessels. Some  reactive adenopathy in the right retroperitoneum likely related to recent stenting procedure and presumed lithotripsy. Reproductive: The prostate and seminal vesicles are unremarkable. Other: No abdominopelvic free fluid or free gas. No bowel containing hernias. Musculoskeletal: Multilevel degenerative changes are present in the imaged portions of the spine. No acute osseous abnormality or suspicious osseous lesion. Post sternotomy changes in the  chest partially visualized. IMPRESSION: 1. Interval placement of a right nephroureteral stent terminating in appropriate positioning within the right renal pelvis and urinary bladder. Resolution of the previously seen large staghorn calculus in the right renal pelvis compatible with lithotripsy. Residual right parapelvic and proximal periureteral hazy stranding is nonspecific but similar to comparison study. 2. Few residual nonobstructing calculi present in the right kidney. 3. Mild gallbladder wall thickening without visible calcified gallstones or biliary ductal dilatation. If there is concern for acute cholecystitis, recommend further evaluation with right upper quadrant ultrasound. 4.  Aortic Atherosclerosis (ICD10-I70.0). 5. Prior sternotomy and aortic valve replacement. Electronically Signed   By: Lovena Le M.D.   On: 06/28/2019 21:57   DG OR UROLOGY CYSTO IMAGE ([place])  Result Date: 06/17/2019 There is no interpretation for this exam.  This order is for images obtained during a surgical procedure.  Please See "Surgeries" Tab for more information regarding the procedure.   ECHOCARDIOGRAM COMPLETE  Result Date: 06/30/2019   ECHOCARDIOGRAM REPORT   Patient Name:   Milliard Bores [facility]. Date of Exam: 06/30/2019 Medical Rec #:  [id number]              Height:       69.0 in Accession #:    [id number]             Weight:       244.7 lb Date of Birth:  18-Aug-1945              BSA:          2.25 m Patient Age:    73 years               BP:           N/A/N/A mmHg Patient Gender: M                      HR:           N/A bpm. Exam Location:  [facility] Procedure: Cardiac Doppler and Color Doppler Indications:     Bacteremia 790.7  History:         Patient has prior history of Echocardiogram examinations, most                  recent 04/21/2019. Prior CABG; Risk Factors:Hypertension and                  Diabetes. History of bioprosthetic Aortic valve replacement.  Sonographer:     Sherrie Sport RDCS (AE) Referring  Phys:  [id number] Tsosie Billing Diagnosing Phys: Ida Rogue MD IMPRESSIONS  1. Left ventricular ejection fraction, by visual estimation, is 60 to 65%. The left ventricle has normal function. There is no left ventricular hypertrophy.  2. Left ventricular diastolic parameters are consistent with Grade I diastolic dysfunction (impaired relaxation).  3. The left ventricle has no regional wall motion abnormalities.  4. Global right ventricle has normal systolic function.The right ventricular size is normal. No increase in right ventricular wall thickness.  5. Left atrial size was normal.  6. Mild mitral valve regurgitation.  7. Tricuspid valve regurgitation  is mild.  8. Mild to moderate aortic valve sclerosis/calcification without any evidence of aortic stenosis.  9. Aortic valve area, by VTI measures 1.86 cm. 10. Normal pulmonary artery systolic pressure. 11. No valve vegetation noted. FINDINGS  Left Ventricle: Left ventricular ejection fraction, by visual estimation, is 60 to 65%. The left ventricle has normal function. The left ventricle has no regional wall motion abnormalities. There is no left ventricular hypertrophy. Left ventricular diastolic parameters are consistent with Grade I diastolic dysfunction (impaired relaxation). Normal left atrial pressure. Right Ventricle: The right ventricular size is normal. No increase in right ventricular wall thickness. Global RV systolic function is has normal systolic function. The tricuspid regurgitant velocity is 1.97 m/s, and with an assumed right atrial pressure  of 10 mmHg, the estimated right ventricular systolic pressure is normal at 25.5 mmHg. Left Atrium: Left atrial size was normal in size. Right Atrium: Right atrial size was normal in size Pericardium: There is no evidence of pericardial effusion. Mitral Valve: The mitral valve is normal in structure. Mild mitral valve regurgitation. No evidence of mitral valve stenosis by observation. Tricuspid Valve:  The tricuspid valve is normal in structure. Tricuspid valve regurgitation is mild. Aortic Valve: The aortic valve is normal in structure. Aortic valve regurgitation is not visualized. Mild to moderate aortic valve sclerosis/calcification is present, without any evidence of aortic stenosis. Aortic valve mean gradient measures 11.0 mmHg.  Aortic valve peak gradient measures 21.5 mmHg. Aortic valve area, by VTI measures 1.86 cm. Pulmonic Valve: The pulmonic valve was normal in structure. Pulmonic valve regurgitation is not visualized. Pulmonic regurgitation is not visualized. Aorta: The aortic root, ascending aorta and aortic arch are all structurally normal, with no evidence of dilitation or obstruction. Venous: The inferior vena cava is normal in size with greater than 50% respiratory variability, suggesting right atrial pressure of 3 mmHg. IAS/Shunts: No atrial level shunt detected by color flow Doppler. There is no evidence of a patent foramen ovale. No ventricular septal defect is seen or detected. There is no evidence of an atrial septal defect.  LEFT VENTRICLE PLAX 2D LVIDd:         4.06 cm  Diastology LVIDs:         2.56 cm  LV e' lateral:   8.27 cm/s LV PW:         1.29 cm  LV E/e' lateral: 9.8 LV IVS:        1.24 cm  LV e' medial:    5.00 cm/s LVOT diam:     2.00 cm  LV E/e' medial:  16.2 LV SV:         49 ml LV SV Index:   20.62 LVOT Area:     3.14 cm  RIGHT VENTRICLE RV Basal diam:  3.65 cm RV S prime:     12.70 cm/s TAPSE (M-mode): 3.8 cm LEFT ATRIUM             Index       RIGHT ATRIUM           Index LA diam:        4.20 cm 1.87 cm/m  RA Area:     20.30 cm LA Vol (A2C):   41.5 ml 18.44 ml/m RA Volume:   54.70 ml  24.30 ml/m LA Vol (A4C):   52.4 ml 23.28 ml/m LA Biplane Vol: 48.6 ml 21.59 ml/m  AORTIC VALVE  PULMONIC VALVE AV Area (Vmax):    1.56 cm     PV Vmax:        1.05 m/s AV Area (Vmean):   1.78 cm     PV Peak grad:   4.4 mmHg AV Area (VTI):     1.86 cm     RVOT Peak  grad: 4 mmHg AV Vmax:           232.00 cm/s AV Vmean:          147.333 cm/s AV VTI:            0.434 m AV Peak Grad:      21.5 mmHg AV Mean Grad:      11.0 mmHg LVOT Vmax:         115.00 cm/s LVOT Vmean:        83.300 cm/s LVOT VTI:          0.257 m LVOT/AV VTI ratio: 0.59  AORTA Ao Root diam: 2.70 cm MITRAL VALVE                         TRICUSPID VALVE MV Area (PHT): 3.08 cm              TR Peak grad:   15.5 mmHg MV PHT:        71.34 msec            TR Vmax:        197.00 cm/s MV Decel Time: 246 msec MV E velocity: 81.00 cm/s  103 cm/s  SHUNTS MV A velocity: 105.00 cm/s 70.3 cm/s Systemic VTI:  0.26 m MV E/A ratio:  0.77        1.5       Systemic Diam: 2.00 cm  Ida Rogue MD Electronically signed by Ida Rogue MD Signature Date/Time: 06/30/2019/2:58:20 PM    Final    ECHO TEE  Result Date: 07/04/2019   TRANSESOPHOGEAL ECHO REPORT   Patient Name:   Jacaleb Puhr [facility]. Date of Exam: 07/04/2019 Medical Rec #:  [id number]              Height:       69.0 in Accession #:    [id number]             Weight:       244.7 lb Date of Birth:  1945/12/26              BSA:          2.25 m Patient Age:    1 years               BP:           180/87 mmHg Patient Gender: M                      HR:           110 bpm. Exam Location:  [facility]  Procedure: Transesophageal Echo and Color Doppler Indications:     Bacteremia 790.7  History:         Patient has prior history of Echocardiogram examinations, most                  recent 06/30/2019. History of AVR                  History of CABG.  Sonographer:     Sherrie Sport RDCS (AE) Referring Phys:  [phone]  Rise Mu Diagnosing Phys: Kathlyn Sacramento MD  PROCEDURE: No evidence of vegetation. After discussion of the risks and benefits of a TEE, an informed consent was obtained from the patient. Patients was under conscious sedation during this procedure. Anesthetic was administered intravenously by performing Physician: 74mcg of Fentanyl, 3mg  of Versed. The transesophogeal probe  was passed through the esophogus of the patient. Imaged were obtained with the patient in a left lateral decubitus position. Image quality was good. The patient's vital signs; including heart rate, blood pressure, and oxygen saturation; remained stable throughout the procedure. The patient developed no complications during the procedure. IMPRESSIONS  1. Left ventricular ejection fraction, by visual estimation, is 55 to 60%. The left ventricle has normal function. There is no left ventricular hypertrophy.  2. Left ventricular diastolic function could not be evaluated.  3. Global right ventricle has normal systolic function.The right ventricular size is normal. No increase in right ventricular wall thickness.  4. The mitral valve is normal in structure. Trivial mitral valve regurgitation. No evidence of mitral stenosis.  5. The tricuspid valve is normal in structure. Tricuspid valve regurgitation is not demonstrated.  6. Aortic valve regurgitation is not visualized. No evidence of aortic valve sclerosis or stenosis.  7. The pulmonic valve was normal in structure. Pulmonic valve regurgitation is not visualized.  8. TR signal is inadequate for assessing pulmonary artery systolic pressure.  9. Normal functioning bioprosthetic aortic valve with no vegetations. 10. Difficult study due to difficulty sedating the patient and severe cough. 11. The left ventricle has no regional wall motion abnormalities. FINDINGS  Left Ventricle: Left ventricular ejection fraction, by visual estimation, is 55 to 60%. The left ventricle has normal function. The left ventricle has no regional wall motion abnormalities. There is no left ventricular hypertrophy. Left ventricular diastolic function could not be evaluated. Normal left atrial pressure. Right Ventricle: The right ventricular size is normal. No increase in right ventricular wall thickness. Global RV systolic function is has normal systolic function. Left Atrium: Left atrial size was  normal in size. Right Atrium: Right atrial size was normal in size Pericardium: There is no evidence of pericardial effusion. Mitral Valve: The mitral valve is normal in structure. No evidence of mitral valve stenosis by observation. Trivial mitral valve regurgitation. Tricuspid Valve: The tricuspid valve is normal in structure. Tricuspid valve regurgitation is not demonstrated. Aortic Valve: The aortic valve has been repaired/replaced. Aortic valve regurgitation is not visualized. The aortic valve is structurally normal, with no evidence of sclerosis or stenosis. Bioprosthetic aortic valve valve is present in the aortic position. Pulmonic Valve: The pulmonic valve was normal in structure. Pulmonic valve regurgitation is not visualized. Aorta: The aortic root, ascending aorta and aortic arch are all structurally normal, with no evidence of dilitation or obstruction. Venous: The inferior vena cava was not well visualized. Shunts: There is no evidence of a patent foramen ovale. No ventricular septal defect is seen or detected. There is no evidence of an atrial septal defect. No atrial level shunt detected by color flow Doppler.  Kathlyn Sacramento MD Electronically signed by Kathlyn Sacramento MD Signature Date/Time: 07/04/2019/1:40:22 PM    Final    [place] EKG SITE RITE  Result Date: 07/04/2019 If Site Rite image not attached, placement could not be confirmed due to current cardiac rhythm.    Subjective: Seen and examined on day of discharge Doing well, lethargy improving PICC line in place Medically stable for discharge home  Discharge Exam: Vitals:   07/05/19 0421  07/05/19 1155  BP: 126/70 (!) 129/116  Pulse: 87 77  Resp: 20 18  Temp: 97.7 F (36.5 C) (!) 97.3 F (36.3 C)  SpO2: 98% 97%   Vitals:   07/04/19 2008 07/04/19 2114 07/05/19 0421 07/05/19 1155  BP: 95/72  126/70 (!) 129/116  Pulse: 88 85 87 77  Resp: 20 20 20 18   Temp: 98.6 F (37 C)  97.7 F (36.5 C) (!) 97.3 F (36.3 C)  TempSrc:  Oral  Oral   SpO2: 98% 95% 98% 97%  Weight:      Height:        General: Pt is alert, awake, not in acute distress Cardiovascular: RRR, S1/S2 +, no rubs, no gallops Respiratory: CTA bilaterally, no wheezing, no rhonchi Abdominal: Soft, NT, ND, bowel sounds + Extremities: no edema, no cyanosis    The results of significant diagnostics from this hospitalization (including imaging, microbiology, ancillary and laboratory) are listed below for reference.     Microbiology: Recent Results (from the past 240 hour(s))  Blood culture (single)     Status: Abnormal   Collection Time: 06/26/19  6:14 PM   Specimen: BLOOD  Result Value Ref Range Status   Specimen Description   Final    BLOOD RIGHT ANTECUBITAL Performed at [facility], [place] [address] [place], [place] [address]    Special Requests   Final    BOTTLES DRAWN AEROBIC AND ANAEROBIC Blood Culture results may not be optimal due to an excessive volume of blood received in culture bottles Performed at [facility], [address], [place], [place] [address]    Culture  Setup Time   Final    GRAM POSITIVE COCCI IN BOTH AEROBIC AND ANAEROBIC BOTTLES CRITICAL RESULT CALLED TO, READ BACK BY AND VERIFIED WITH: Romie Minus AT 1805 06/27/2019  TFK Performed at [facility], [address] [place], [place] [address]    Culture ENTEROCOCCUS FAECALIS (A)  Final   Report Status 06/30/2019 FINAL  Final   Organism ID, Bacteria ENTEROCOCCUS FAECALIS  Final      Susceptibility   Enterococcus faecalis - MIC*    AMPICILLIN <=2 SENSITIVE Sensitive     VANCOMYCIN 1 SENSITIVE Sensitive     GENTAMICIN SYNERGY SENSITIVE Sensitive     * ENTEROCOCCUS FAECALIS  Urine culture     Status: None   Collection Time: 06/26/19  6:14 PM   Specimen: Urine, Random  Result Value Ref Range Status   Specimen Description   Final    URINE, RANDOM Performed at [facility], [address] [place], [place] [address]     Special Requests   Final    NONE Performed at [facility], [address], [place], [place] [address]    Culture   Final    NO GROWTH Performed at [facility], [place] [address], [place], [place] [address]    Report Status 06/28/2019 FINAL  Final  Culture, blood (routine x 2)     Status: None   Collection Time: 06/28/19  9:50 PM   Specimen: BLOOD  Result Value Ref Range Status   Specimen Description BLOOD RIGHT ASSIST CONTROL  Final   Special Requests   Final    BOTTLES DRAWN AEROBIC AND ANAEROBIC Blood Culture adequate volume   Culture   Final    NO GROWTH 5 DAYS Performed at [facility], [address] [place], [place] [address]    Report Status 07/03/2019 FINAL  Final  Culture, blood (routine x 2)     Status: None   Collection Time: 06/28/19  9:50 PM   Specimen: BLOOD  Result Value Ref Range Status   Specimen Description BLOOD LEFT ASSIST CONTROL  Final   Special Requests   Final    BOTTLES DRAWN AEROBIC AND ANAEROBIC Blood Culture adequate volume   Culture   Final    NO GROWTH 5 DAYS Performed at [facility], [address] [place], [place] [address]    Report Status 07/03/2019 FINAL  Final  SARS CORONAVIRUS 2 (TAT 6-24 HRS) Nasopharyngeal Nasopharyngeal Swab     Status: None   Collection Time: 06/28/19  9:50 PM   Specimen: Nasopharyngeal Swab  Result Value Ref Range Status   SARS Coronavirus 2 NEGATIVE NEGATIVE Final    Comment: (NOTE) SARS-CoV-2 target nucleic acids are NOT DETECTED. The SARS-CoV-2 RNA is generally detectable in upper and lower respiratory specimens during the acute phase of infection. Negative results do not preclude SARS-CoV-2 infection, do not rule out co-infections with other pathogens, and should not be used as the sole basis for treatment or other patient management decisions. Negative results must be combined with clinical observations, patient history, and epidemiological information. The  expected result is Negative. Fact Sheet for Patients: SugarRoll.be Fact Sheet for Healthcare Providers: https://www.woods-mathews.com/ This test is not yet approved or cleared by the [place] FDA and  has been authorized for detection and/or diagnosis of SARS-CoV-2 by FDA under an Emergency Use Authorization (EUA). This EUA will remain  in effect (meaning this test can be used) for the duration of the COVID-19 declaration under Section 56 4(b)(1) of the Act, 21 U.S.C. section 360bbb-3(b)(1), unless the authorization is terminated or revoked sooner. Performed at [facility], [place] [address], [place], [place] [address]   Urine culture     Status: None   Collection Time: 06/28/19 11:27 PM   Specimen: Urine, Random  Result Value Ref Range Status   Specimen Description   Final    URINE, RANDOM Performed at [facility], [address] [place], [place] [address]    Special Requests   Final    NONE Performed at [facility], [address] [place], [place] [address]    Culture   Final    NO GROWTH Performed at [facility], [place] [address] [place], [place] [address]    Report Status 06/29/2019 FINAL  Final     Labs: BNP (last 3 results) No results for input(s): BNP in the last 8760 hours. Basic Metabolic Panel: Recent Labs  Lab 06/28/19 1829 06/29/19 0402 07/05/19 0518  NA 132* 135  --   K 4.0 4.0  --   CL 102 107  --   CO2 22 20*  --   GLUCOSE 222* 147*  --   BUN 19 15  --   CREATININE 0.99 0.88 0.73  CALCIUM 8.4* 8.0*  --   MG  --  1.9  --   PHOS  --  3.0  --    Liver Function Tests: Recent Labs  Lab 06/28/19 1829 06/29/19 0402  AST 40 33  ALT 38 36  ALKPHOS 65 58  BILITOT 0.8 0.8  PROT 6.8 6.5  ALBUMIN 3.2* 2.9*   No results for input(s): LIPASE, AMYLASE in the last 168 hours. No results for input(s): AMMONIA in the last 168 hours. CBC: Recent Labs  Lab 06/28/19 1829  06/29/19 0402  WBC 4.7 6.2  NEUTROABS  2.2  --   HGB 10.2* 9.3*  HCT 29.7* 28.2*  MCV 98.7 102.5*  PLT 106* 118*   Cardiac Enzymes: No results for input(s): CKTOTAL, CKMB, CKMBINDEX, TROPONINI in the last 168 hours. BNP: Invalid input(s): POCBNP CBG: Recent Labs  Lab 07/04/19 0955 07/04/19 1239 07/04/19 1701 07/04/19 2153 07/05/19 1158  GLUCAP 178* 295* 294* 297* 265*   D-Dimer No results for input(s): DDIMER in the last 72 hours. Hgb A1c No results for input(s): HGBA1C in the last 72 hours. Lipid Profile No results for input(s): CHOL, HDL, LDLCALC, TRIG, CHOLHDL, LDLDIRECT in the last 72 hours. Thyroid function studies No results for input(s): TSH, T4TOTAL, T3FREE, THYROIDAB in the last 72 hours.  Invalid input(s): FREET3 Anemia work up No results for input(s): VITAMINB12, FOLATE, FERRITIN, TIBC, IRON, RETICCTPCT in the last 72 hours. Urinalysis    Component Value Date/Time   COLORURINE STRAW (A) 06/28/2019 2327   APPEARANCEUR CLEAR (A) 06/28/2019 2327   APPEARANCEUR Cloudy (A) 06/07/2019 1409   LABSPEC 1.033 (H) 06/28/2019 2327   PHURINE 6.0 06/28/2019 2327   GLUCOSEU NEGATIVE 06/28/2019 2327   HGBUR MODERATE (A) 06/28/2019 2327   BILIRUBINUR NEGATIVE 06/28/2019 2327   BILIRUBINUR Negative 06/07/2019 1409   KETONESUR NEGATIVE 06/28/2019 2327   PROTEINUR NEGATIVE 06/28/2019 2327   NITRITE NEGATIVE 06/28/2019 2327   LEUKOCYTESUR TRACE (A) 06/28/2019 2327   Sepsis Labs Invalid input(s): PROCALCITONIN,  WBC,  LACTICIDVEN Microbiology Recent Results (from the past 240 hour(s))  Blood culture (single)     Status: Abnormal   Collection Time: 06/26/19  6:14 PM   Specimen: BLOOD  Result Value Ref Range Status   Specimen Description   Final    BLOOD RIGHT ANTECUBITAL Performed at [facility], [place] [address] [place], [place] [address]    Special Requests   Final    BOTTLES DRAWN AEROBIC AND ANAEROBIC Blood Culture results may not be optimal due to an  excessive volume of blood received in culture bottles Performed at [facility], [address] [place], [place] [address]    Culture  Setup Time   Final    GRAM POSITIVE COCCI IN BOTH AEROBIC AND ANAEROBIC BOTTLES CRITICAL RESULT CALLED TO, READ BACK BY AND VERIFIED WITH: Romie Minus AT 1805 06/27/2019  TFK Performed at [facility], [address] [place], [place] [address]    Culture ENTEROCOCCUS FAECALIS (A)  Final   Report Status 06/30/2019 FINAL  Final   Organism ID, Bacteria ENTEROCOCCUS FAECALIS  Final      Susceptibility   Enterococcus faecalis - MIC*    AMPICILLIN <=2 SENSITIVE Sensitive     VANCOMYCIN 1 SENSITIVE Sensitive     GENTAMICIN SYNERGY SENSITIVE Sensitive     * ENTEROCOCCUS FAECALIS  Urine culture     Status: None   Collection Time: 06/26/19  6:14 PM   Specimen: Urine, Random  Result Value Ref Range Status   Specimen Description   Final    URINE, RANDOM Performed at [facility], [address] [place], [place] [address]    Special Requests   Final    NONE Performed at [facility], [address], [place], [place] [address]    Culture   Final    NO GROWTH Performed at [facility], [place] [address] [place],  [address]    Report Status 06/28/2019 FINAL  Final  Culture, blood (routine x 2)     Status: None   Collection Time: 06/28/19  9:50 PM   Specimen: BLOOD  Result Value Ref Range Status   Specimen Description BLOOD RIGHT ASSIST CONTROL  Final   Special Requests   Final    BOTTLES DRAWN AEROBIC AND ANAEROBIC Blood Culture adequate volume   Culture   Final    NO GROWTH 5 DAYS Performed at [facility], [address], [place], [place] [address]    Report Status 07/03/2019 FINAL  Final  Culture, blood (routine x 2)     Status: None   Collection Time: 06/28/19  9:50 PM   Specimen: BLOOD  Result Value Ref Range Status   Specimen Description BLOOD LEFT ASSIST CONTROL  Final   Special  Requests   Final    BOTTLES DRAWN AEROBIC AND ANAEROBIC Blood Culture adequate volume   Culture   Final    NO GROWTH 5 DAYS Performed at [facility], [address], [place], [place] [address]    Report Status 07/03/2019 FINAL  Final  SARS CORONAVIRUS 2 (TAT 6-24 HRS) Nasopharyngeal Nasopharyngeal Swab     Status: None   Collection Time: 06/28/19  9:50 PM   Specimen: Nasopharyngeal Swab  Result Value Ref Range Status   SARS Coronavirus 2 NEGATIVE NEGATIVE Final    Comment: (NOTE) SARS-CoV-2 target nucleic acids are NOT DETECTED. The SARS-CoV-2 RNA is generally detectable in upper and lower respiratory specimens during the acute phase of infection. Negative results do not preclude SARS-CoV-2 infection, do not rule out co-infections with other pathogens, and should not be used as the sole basis for treatment or other patient management decisions. Negative results must be combined with clinical observations, patient history, and epidemiological information. The expected result is Negative. Fact Sheet for Patients: SugarRoll.be Fact Sheet for Healthcare Providers: https://www.woods-mathews.com/ This test is not yet approved or cleared by the [place] FDA and  has been authorized for detection and/or diagnosis of SARS-CoV-2 by FDA under an Emergency Use Authorization (EUA). This EUA will remain  in effect (meaning this test can be used) for the duration of the COVID-19 declaration under Section 56 4(b)(1) of the Act, 21 U.S.C. section 360bbb-3(b)(1), unless the authorization is terminated or revoked sooner. Performed at [facility], [place] [address] [place], [place] [address]   Urine culture     Status: None   Collection Time: 06/28/19 11:27 PM   Specimen: Urine, Random  Result Value Ref Range Status   Specimen Description   Final    URINE, RANDOM Performed at [facility], [address] [place],  [place] [address]    Special Requests   Final    NONE Performed at [facility], [address] [place], [place] [address]    Culture   Final    NO GROWTH Performed at [facility], [place] [address], [place],  [address]    Report Status 06/29/2019 FINAL  Final     Time coordinating discharge: Over 30 minutes  SIGNED:   Sidney Ace, MD  [facility] 07/05/2019, 3:09 PM Pager [phone]  If 7PM-7AM, please contact night-coverage [email or web address] Password [email or web address]

## 2019-07-05 NOTE — TOC Transition Note (Signed)
Transition of Care [facility]) - CM/SW Discharge Note   Patient Details  Name: Jonathan Cross. MRN: [id number] Date of Birth: 15-Aug-1945  Transition of Care [facility] CM/SW Contact:  Beverly Sessions, RN Phone Number: 07/05/2019, 1:29 PM   Clinical Narrative:    Patient to discharge home today with IV antibiotics.   RNCM confirmed with [facility], and Cory with [facility] that this has already been arranged and start of care will be tomorrow      Barriers to Discharge: Continued Medical Work up   Patient Goals and CMS Choice Patient states their goals for this hospitalization and ongoing recovery are:: to get back home and get back to work      Discharge Placement                       Discharge Plan and Services   Discharge Planning Services: CM Consult                                 Social Determinants of Health (SDOH) Interventions     Readmission Risk Interventions Readmission Risk Prevention Plan 06/30/2019  Transportation Screening Complete  HRI or Home Care Consult Patient refused  Social Work Consult for [place] Planning/Counseling Complete  Palliative Care Screening Not Applicable  Medication Review Press photographer) Complete  Some recent data might be hidden

## 2019-07-05 NOTE — Progress Notes (Signed)
Peripherally Inserted Central Catheter/Midline Placement  The IV Nurse has discussed with the patient and/or persons authorized to consent for the patient, the purpose of this procedure and the potential benefits and risks involved with this procedure.  The benefits include less needle sticks, lab draws from the catheter, and the patient may be discharged home with the catheter. Risks include, but not limited to, infection, bleeding, blood clot (thrombus formation), and puncture of an artery; nerve damage and irregular heartbeat and possibility to perform a PICC exchange if needed/ordered by physician.  Alternatives to this procedure were also discussed.  Bard Power PICC patient education guide, fact sheet on infection prevention and patient information card has been provided to patient /or left at bedside.    PICC/Midline Placement Documentation  PICC Single Lumen [id number] PICC Right Basilic 41 cm 0 cm (Active)  Indication for Insertion or Continuance of Line Home intravenous therapies (PICC only) 07/05/19 0830  Exposed Catheter (cm) 0 cm 07/05/19 0830  Site Assessment Clean;Dry;Intact 07/05/19 0830  Line Status Flushed;Blood return noted;Saline locked 07/05/19 0830  Dressing Type Transparent 07/05/19 0830  Dressing Status Dry;Clean;Antimicrobial disc in place;Intact 07/05/19 0830  Dressing Change Due 07/12/19 07/05/19 0830       Scotty Court 07/05/2019, 8:40 AM

## 2019-07-06 ENCOUNTER — Other Ambulatory Visit: Admission: RE | Admit: 2019-07-06 | Payer: Medicare HMO | Source: Ambulatory Visit

## 2019-07-07 ENCOUNTER — Other Ambulatory Visit
Admit: 2019-07-07 | Discharge: 2019-07-07 | Disposition: A | Discharge status: Home / Self Care | Payer: Medicare HMO | Source: Ambulatory Visit | Attending: Urology | Admitting: Urology

## 2019-07-07 DIAGNOSIS — Z20828 Contact with and (suspected) exposure to other viral communicable diseases: Secondary | ICD-10-CM | POA: Diagnosis not present

## 2019-07-07 DIAGNOSIS — Z01812 Encounter for preprocedural laboratory examination: Secondary | ICD-10-CM | POA: Diagnosis present

## 2019-07-07 LAB — SARS CORONAVIRUS 2 (TAT 6-24 HRS): SARS Coronavirus 2: NEGATIVE

## 2019-07-10 MED ORDER — LEVOFLOXACIN IN D5W 500 MG/100ML IV SOLN
500.0000 mg | INTRAVENOUS | Status: AC
  Administered 2019-07-11: 500 mg via INTRAVENOUS

## 2019-07-11 ENCOUNTER — Ambulatory Visit: Payer: Medicare HMO | Admitting: Anesthesiology

## 2019-07-11 ENCOUNTER — Ambulatory Visit
Admission: RE | Admit: 2019-07-11 | Discharge: 2019-07-11 | Disposition: A | Discharge status: Home / Self Care | Payer: Medicare HMO | Attending: Urology | Admitting: Urology

## 2019-07-11 ENCOUNTER — Other Ambulatory Visit: Payer: Self-pay

## 2019-07-11 ENCOUNTER — Ambulatory Visit: Payer: Medicare HMO

## 2019-07-11 ENCOUNTER — Telehealth: Payer: Self-pay

## 2019-07-11 ENCOUNTER — Encounter: Payer: Self-pay | Admitting: Urology

## 2019-07-11 ENCOUNTER — Encounter
Admission: RE | Disposition: A | Discharge status: Home / Self Care | Payer: Self-pay | Source: Home / Self Care | Attending: Urology

## 2019-07-11 DIAGNOSIS — Z952 Presence of prosthetic heart valve: Secondary | ICD-10-CM | POA: Diagnosis not present

## 2019-07-11 DIAGNOSIS — Z87891 Personal history of nicotine dependence: Secondary | ICD-10-CM | POA: Insufficient documentation

## 2019-07-11 DIAGNOSIS — G4730 Sleep apnea, unspecified: Secondary | ICD-10-CM | POA: Insufficient documentation

## 2019-07-11 DIAGNOSIS — E785 Hyperlipidemia, unspecified: Secondary | ICD-10-CM | POA: Diagnosis not present

## 2019-07-11 DIAGNOSIS — I10 Essential (primary) hypertension: Secondary | ICD-10-CM | POA: Diagnosis not present

## 2019-07-11 DIAGNOSIS — N200 Calculus of kidney: Secondary | ICD-10-CM | POA: Insufficient documentation

## 2019-07-11 DIAGNOSIS — E119 Type 2 diabetes mellitus without complications: Secondary | ICD-10-CM | POA: Diagnosis not present

## 2019-07-11 DIAGNOSIS — I25118 Atherosclerotic heart disease of native coronary artery with other forms of angina pectoris: Secondary | ICD-10-CM | POA: Insufficient documentation

## 2019-07-11 HISTORY — PX: STONE EXTRACTION WITH BASKET: SHX5318

## 2019-07-11 HISTORY — PX: CYSTOSCOPY W/ RETROGRADES: SHX1426

## 2019-07-11 HISTORY — PX: CYSTOSCOPY/URETEROSCOPY/HOLMIUM LASER/STENT PLACEMENT: SHX6546

## 2019-07-11 LAB — GLUCOSE, CAPILLARY
Glucose-Capillary: 159 mg/dL — ABNORMAL HIGH (ref 70–99)
Glucose-Capillary: 131 mg/dL — ABNORMAL HIGH (ref 70–99)

## 2019-07-11 SURGERY — CYSTOSCOPY/URETEROSCOPY/HOLMIUM LASER/STENT PLACEMENT
Anesthesia: General | Site: Ureter | Laterality: Right

## 2019-07-11 MED ORDER — SUCCINYLCHOLINE CHLORIDE 20 MG/ML IJ SOLN
INTRAMUSCULAR | Status: DC | PRN
  Administered 2019-07-11: 120 mg via INTRAVENOUS

## 2019-07-11 MED ORDER — ONDANSETRON HCL 4 MG/2ML IJ SOLN
INTRAMUSCULAR | Status: DC | PRN
  Administered 2019-07-11: 4 mg via INTRAVENOUS

## 2019-07-11 MED ORDER — MIDAZOLAM HCL 2 MG/2ML IJ SOLN
INTRAMUSCULAR | Status: DC | PRN
  Administered 2019-07-11: 2 mg via INTRAVENOUS

## 2019-07-11 MED ORDER — LEVOFLOXACIN IN D5W 500 MG/100ML IV SOLN
INTRAVENOUS | Status: AC
  Filled 2019-07-11: qty 100

## 2019-07-11 MED ORDER — ROCURONIUM BROMIDE 100 MG/10ML IV SOLN
INTRAVENOUS | Status: DC | PRN
  Administered 2019-07-11 (×2): 20 mg via INTRAVENOUS
  Administered 2019-07-11: 10 mg via INTRAVENOUS

## 2019-07-11 MED ORDER — FENTANYL CITRATE (PF) 100 MCG/2ML IJ SOLN
INTRAMUSCULAR | Status: AC
  Filled 2019-07-11: qty 2

## 2019-07-11 MED ORDER — PROPOFOL 10 MG/ML IV BOLUS
INTRAVENOUS | Status: DC | PRN
  Administered 2019-07-11: 150 mg via INTRAVENOUS

## 2019-07-11 MED ORDER — OXYCODONE-ACETAMINOPHEN 7.5-325 MG PO TABS: 1.0000 | ORAL_TABLET | ORAL | 0 refills | Status: AC | PRN

## 2019-07-11 MED ORDER — SUGAMMADEX SODIUM 200 MG/2ML IV SOLN
INTRAVENOUS | Status: DC | PRN
  Administered 2019-07-11: 222 mg via INTRAVENOUS

## 2019-07-11 MED ORDER — LIDOCAINE HCL (CARDIAC) PF 100 MG/5ML IV SOSY
PREFILLED_SYRINGE | INTRAVENOUS | Status: DC | PRN
  Administered 2019-07-11: 100 mg via INTRAVENOUS

## 2019-07-11 MED ORDER — IOHEXOL 180 MG/ML  SOLN
INTRAMUSCULAR | Status: DC | PRN
  Administered 2019-07-11: 20 mL

## 2019-07-11 MED ORDER — SODIUM CHLORIDE 0.9 % IV SOLN: INTRAVENOUS | Status: DC

## 2019-07-11 MED ORDER — TAMSULOSIN HCL 0.4 MG PO CAPS: 0.4000 mg | ORAL_CAPSULE | Freq: Every day | ORAL | 0 refills | Status: DC

## 2019-07-11 MED ORDER — SEVOFLURANE IN SOLN
RESPIRATORY_TRACT | Status: AC
  Filled 2019-07-11: qty 250

## 2019-07-11 MED ORDER — DEXAMETHASONE SODIUM PHOSPHATE 10 MG/ML IJ SOLN
INTRAMUSCULAR | Status: DC | PRN
  Administered 2019-07-11: 10 mg via INTRAVENOUS

## 2019-07-11 MED ORDER — BELLADONNA ALKALOIDS-OPIUM 16.2-60 MG RE SUPP
RECTAL | Status: AC
  Filled 2019-07-11: qty 1

## 2019-07-11 MED ORDER — ONDANSETRON HCL 4 MG/2ML IJ SOLN: 4.0000 mg | Freq: Once | INTRAMUSCULAR | Status: DC | PRN

## 2019-07-11 MED ORDER — FENTANYL CITRATE (PF) 100 MCG/2ML IJ SOLN: 25.0000 ug | INTRAMUSCULAR | Status: DC | PRN

## 2019-07-11 MED ORDER — FENTANYL CITRATE (PF) 100 MCG/2ML IJ SOLN
INTRAMUSCULAR | Status: DC | PRN
  Administered 2019-07-11 (×2): 50 ug via INTRAVENOUS

## 2019-07-11 MED ORDER — PHENYLEPHRINE HCL (PRESSORS) 10 MG/ML IV SOLN
INTRAVENOUS | Status: DC | PRN
  Administered 2019-07-11 (×3): 100 ug via INTRAVENOUS

## 2019-07-11 MED ORDER — MIDAZOLAM HCL 2 MG/2ML IJ SOLN
INTRAMUSCULAR | Status: AC
  Filled 2019-07-11: qty 2

## 2019-07-11 SURGICAL SUPPLY — 30 items
BAG DRAIN CYSTO-URO LG1000N (MISCELLANEOUS) ×4 IMPLANT
BASKET ZERO TIP 1.9FR (BASKET) ×4 IMPLANT
BRUSH SCRUB EZ 1% IODOPHOR (MISCELLANEOUS) ×4 IMPLANT
CATH URETL 5X70 OPEN END (CATHETERS) IMPLANT
CNTNR SPEC 2.5X3XGRAD LEK (MISCELLANEOUS)
CONT SPEC 4OZ STER OR WHT (MISCELLANEOUS)
CONTAINER SPEC 2.5X3XGRAD LEK (MISCELLANEOUS) IMPLANT
DRAPE UTILITY 15X26 TOWEL STRL (DRAPES) ×4 IMPLANT
FIBER LASER TRAC TIP (UROLOGICAL SUPPLIES) ×4 IMPLANT
GLOVE BIOGEL PI IND STRL 7.5 (GLOVE) ×2 IMPLANT
GLOVE BIOGEL PI INDICATOR 7.5 (GLOVE) ×2
GOWN STRL REUS W/ TWL LRG LVL3 (GOWN DISPOSABLE) ×2 IMPLANT
GOWN STRL REUS W/ TWL XL LVL3 (GOWN DISPOSABLE) ×2 IMPLANT
GOWN STRL REUS W/TWL LRG LVL3 (GOWN DISPOSABLE) ×2
GOWN STRL REUS W/TWL XL LVL3 (GOWN DISPOSABLE) ×2
GUIDEWIRE STR DUAL SENSOR (WIRE) ×4 IMPLANT
INFUSOR MANOMETER BAG 3000ML (MISCELLANEOUS) ×4 IMPLANT
INTRODUCER DILATOR DOUBLE (INTRODUCER) IMPLANT
KIT TURNOVER CYSTO (KITS) ×4 IMPLANT
PACK CYSTO AR (MISCELLANEOUS) ×4 IMPLANT
SET CYSTO W/LG BORE CLAMP LF (SET/KITS/TRAYS/PACK) ×4 IMPLANT
SHEATH URETERAL 12FRX35CM (MISCELLANEOUS) ×4 IMPLANT
SOL .9 NS 3000ML IRR  AL (IV SOLUTION) ×2
SOL .9 NS 3000ML IRR UROMATIC (IV SOLUTION) ×2 IMPLANT
STENT URET 6FRX24 CONTOUR (STENTS) IMPLANT
STENT URET 6FRX26 CONTOUR (STENTS) ×4 IMPLANT
SURGILUBE 2OZ TUBE FLIPTOP (MISCELLANEOUS) ×4 IMPLANT
SYR 10ML LL (SYRINGE) ×4 IMPLANT
VALVE UROSEAL ADJ ENDO (VALVE) ×4 IMPLANT
WATER STERILE IRR 1000ML POUR (IV SOLUTION) ×4 IMPLANT

## 2019-07-11 NOTE — Discharge Instructions (Signed)

## 2019-07-11 NOTE — Anesthesia Preprocedure Evaluation (Signed)
Anesthesia Evaluation  Patient identified by MRN, date of birth, ID band Patient awake    Reviewed: Allergy & Precautions, H&P , NPO status , Patient's Chart, lab work & pertinent test results, reviewed documented beta blocker date and time   History of Anesthesia Complications Negative for: history of anesthetic complications  Airway Mallampati: II  TM Distance: >3 FB Neck ROM: full    Dental  (+) Dental Advidsory Given, Upper Dentures, Lower Dentures, Edentulous Upper, Edentulous Lower   Pulmonary neg shortness of breath, sleep apnea , neg COPD, neg recent URI, former smoker,    Pulmonary exam normal        Cardiovascular Exercise Tolerance: Good hypertension, + angina (stable angina, no EKG changes) + CAD and + CABG  (-) Past MI and (-) Cardiac Stents Normal cardiovascular exam(-) dysrhythmias + Valvular Problems/Murmurs (sp AVR) AS      Neuro/Psych negative neurological ROS  negative psych ROS   GI/Hepatic Neg liver ROS, GERD  ,  Endo/Other  diabetes  Renal/GU Renal disease (kidney stones)  negative genitourinary   Musculoskeletal   Abdominal   Peds  Hematology negative hematology ROS (+)   Anesthesia Other Findings Past Medical History: No date: Coronary artery disease No date: Diabetes (HCC) No date: H/O aortic valve replacement No date: History of kidney stones No date: Hx of CABG No date: Hyperlipidemia No date: Hypertension No date: Sleep apnea   Reproductive/Obstetrics negative OB ROS                             Anesthesia Physical  Anesthesia Plan  ASA: III  Anesthesia Plan: General   Post-op Pain Management:    Induction: Intravenous  PONV Risk Score and Plan: 2 and Ondansetron, Dexamethasone and Treatment may vary due to age or medical condition  Airway Management Planned: Oral ETT  Additional Equipment:   Intra-op Plan:   Post-operative Plan: Extubation  in OR  Informed Consent: I have reviewed the patients History and Physical, chart, labs and discussed the procedure including the risks, benefits and alternatives for the proposed anesthesia with the patient or authorized representative who has indicated his/her understanding and acceptance.     Dental Advisory Given  Plan Discussed with: Anesthesiologist, CRNA and Surgeon  Anesthesia Plan Comments:         Anesthesia Quick Evaluation

## 2019-07-11 NOTE — Transfer of Care (Signed)
Immediate Anesthesia Transfer of Care Note  Patient: Ramiel Perniciaro.  Procedure(s) Performed: CYSTOSCOPY/URETEROSCOPY/HOLMIUM LASER/STENT Exchange (Right ) STONE EXTRACTION WITH BASKET (Right Ureter) CYSTOSCOPY WITH RETROGRADE PYELOGRAM (Right Ureter)  Patient Location: PACU  Anesthesia Type:General  Level of Consciousness: awake and sedated  Airway & Oxygen Therapy: Patient Spontanous Breathing and Patient connected to face mask oxygen  Post-op Assessment: Report given to RN and Post -op Vital signs reviewed and stable  Post vital signs: Reviewed and stable  Last Vitals:  Vitals Value Taken Time  BP 151/80 07/11/19 1342  Temp 36.1 C 07/11/19 1257  Pulse 75 07/11/19 1348  Resp 16 07/11/19 1348  SpO2 94 % 07/11/19 1348  Vitals shown include unvalidated device data.  Last Pain:  Vitals:   07/11/19 1330  TempSrc:   PainSc: 0-No pain         Complications: No apparent anesthesia complications

## 2019-07-11 NOTE — Telephone Encounter (Signed)
HH called for verbal orders to do labs and dressings. Will fax orders to sign for [facility] but I did give verbal.

## 2019-07-11 NOTE — Progress Notes (Signed)
Pt states having some chest pressure. Dr. Rosey Bath notified. Acknowledged. Orders received.

## 2019-07-11 NOTE — Anesthesia Procedure Notes (Signed)
Procedure Name: Intubation Date/Time: 07/11/2019 11:56 AM Performed by: Hedda Slade, CRNA Pre-anesthesia Checklist: Patient identified, Patient being monitored, Timeout performed, Emergency Drugs available and Suction available Patient Re-evaluated:Patient Re-evaluated prior to induction Oxygen Delivery Method: Circle system utilized Preoxygenation: Pre-oxygenation with 100% oxygen Induction Type: IV induction Ventilation: Mask ventilation without difficulty Laryngoscope Size: 3 and McGraph Grade View: Grade I Tube type: Oral Tube size: 7.5 mm Number of attempts: 1 Airway Equipment and Method: Stylet Placement Confirmation: ETT inserted through vocal cords under direct vision,  positive ETCO2 and breath sounds checked- equal and bilateral Secured at: 23 cm Tube secured with: Tape Dental Injury: Teeth and Oropharynx as per pre-operative assessment

## 2019-07-11 NOTE — Op Note (Signed)
Date of procedure: 07/11/19  Preoperative diagnosis:  1. Residual right renal stones  Postoperative diagnosis:  1. Same  Procedure: 1. Cystoscopy, right ureteroscopy, right retrograde pyelogram with intraoperative interpretation, laser lithotripsy, basket extraction of stone, right ureteral stent placement  Surgeon: Nickolas Madrid, MD  Anesthesia: General  Complications: None  Intraoperative findings:  1.  Minimal residual lower pole stone dust and small stones.  Larger fragments basket extracted through sheath, and residual dust fragmented to <79mm pieces 2.  Uncomplicated stent placement  EBL: Minimal  Specimens: None  Drains: Right 6 French by 26 cm stent  Indication: Jonathan Cross. is a 73 y.o. patient with right staghorn stone who elected stage ureteroscopy for treatment.  He has a recent history of Enterococcus bacteremia and is on IV ampicillin.  He presents today for management of his small residual lower pole stone burden.  After reviewing the management options for treatment, they elected to proceed with the above surgical procedure(s). We have discussed the potential benefits and risks of the procedure, side effects of the proposed treatment, the likelihood of the patient achieving the goals of the procedure, and any potential problems that might occur during the procedure or recuperation. Informed consent has been obtained.  Description of procedure:  The patient was taken to the operating room and general anesthesia was induced. SCDs were placed for DVT prophylaxis.The patient was placed in the dorsal lithotomy position, prepped and draped in the usual sterile fashion, and preoperative antibiotics(Levaquin) were administered. A preoperative time-out was performed.   A 21 French rigid cystoscope was used to intubate the urethra and a normal appearing urethra was followed proximally into the bladder.  A sensor wire was advanced alongside the right ureteral stent up  to the kidney under fluoroscopic vision.  The stent was then grasped and pulled to the meatus, and a second safety sensor wire was added.  A 12/14 French sheath was advanced carefully over the wire under fluoroscopic vision up to the proximal ureter.  Thorough pyeloscopy with a digital flexible ureteroscope revealed a small amount of stone dust in the lower pole, and two 4 mm stone fragments.  The stone fragments were grasped with a basket and removed through the sheath.  Thorough pyeloscopy revealed only some small residual fragments and dust, the 200 m laser fiber on settings of 0.5 J and 40 Hz was used to dust the remaining small fragments to <44mm pieces.  Thorough pyeloscopy revealed no residual stones.  Contrast was injected through the scope from the proximal ureter and showed no filling defects or hydronephrosis or extravasation.  Careful pullback ureteroscopy revealed no ureteral injury or ureteral fragments.  The rigid cystoscope was backloaded over the wire, and a 6 [place] by 26 cm stent was uneventfully placed with a curl in the renal pelvis and under direct vision in the bladder.  The bladder was drained and a belladonna suppository was placed.  Disposition: Stable to PACU  Plan: Continue IV ampicillin per infectious disease until stent removal on 07/19/2018  Nickolas Madrid, MD

## 2019-07-11 NOTE — Anesthesia Postprocedure Evaluation (Signed)
Anesthesia Post Note  Patient: Jonathan Cross.  Procedure(s) Performed: CYSTOSCOPY/URETEROSCOPY/HOLMIUM LASER/STENT Exchange (Right ) STONE EXTRACTION WITH BASKET (Right Ureter) CYSTOSCOPY WITH RETROGRADE PYELOGRAM (Right Ureter)  Patient location during evaluation: PACU Anesthesia Type: General Level of consciousness: awake and alert Pain management: pain level controlled Vital Signs Assessment: post-procedure vital signs reviewed and stable Respiratory status: spontaneous breathing, nonlabored ventilation, respiratory function stable and patient connected to nasal cannula oxygen Cardiovascular status: blood pressure returned to baseline and stable Postop Assessment: no apparent nausea or vomiting Anesthetic complications: no     Last Vitals:  Vitals:   07/11/19 1357 07/11/19 1405  BP: (!) 142/73 (!) 147/73  Pulse: 71 70  Resp: 16 16  Temp:  (!) 36.1 C  SpO2: 96% 95%    Last Pain:  Vitals:   07/11/19 1405  TempSrc: Temporal  PainSc: 0-No pain                 Martha Clan

## 2019-07-11 NOTE — Anesthesia Post-op Follow-up Note (Signed)
Anesthesia QCDR form completed.        

## 2019-07-11 NOTE — H&P (Signed)
UROLOGY H&P UPDATE  Agree with prior H&P dated 12/16. Co-morbid male with right staghorn kidney stone, x URS/LL/stent x2 as not candidate for PCNL.  Developed enterococcus bacteremia mid December, has been on culture appropriate ampicillin.  Cardiac: RRR Lungs: CTA bilaterally  Laterality: Right Procedure: RIGHT URETEROSCOPY, LASER LITHOTRIPSY, STENT CHANGE  Urine: culture 12/15 no growth  We specifically discussed the risks ureteroscopy including bleeding, infection/sepsis, stent related symptoms including flank pain/urgency/frequency/incontinence/dysuria, ureteral injury, inability to access stone, or need for staged or additional procedures.   Billey Co, MD 07/11/2019

## 2019-07-14 ENCOUNTER — Other Ambulatory Visit: Payer: Self-pay | Admitting: Infectious Diseases

## 2019-07-14 ENCOUNTER — Telehealth: Payer: Self-pay | Admitting: Internal Medicine

## 2019-07-14 MED ORDER — AMPICILLIN 500 MG PO CAPS: 500.0000 mg | ORAL_CAPSULE | Freq: Four times a day (QID) | ORAL | 0 refills | Status: DC

## 2019-07-14 MED ORDER — AMOXICILLIN 500 MG PO CAPS: 500.0000 mg | ORAL_CAPSULE | Freq: Three times a day (TID) | ORAL | 1 refills | Status: DC

## 2019-07-14 NOTE — Telephone Encounter (Signed)
Spoke to patient's son. He was appreciative and will resume f/u with cardiology in future at [facility] or call us back. Appt cancelled.

## 2019-07-14 NOTE — Telephone Encounter (Signed)
It does not sound like urgent cardiology follow-up is needed.  However, she should continue to follow with a cardiologist given her history of stable angina with prior CABG.  She is welcome to follow with [place] or [facility] (where she was previously seen).  Nelva Bush, MD [facility]

## 2019-07-14 NOTE — Telephone Encounter (Signed)
Patient son calling States patient was prescribed ampicillin IVPB in hospital and was told to continue until stent was placed Patient is out of medication - it has been 9 days Please advise if patient should continue and call to discuss

## 2019-07-14 NOTE — Progress Notes (Signed)
Spoke to aptient and son- He is doing well with no fever, rt flank apin, hematuria or dysuria Had lithotripsy and stone removal on 07/11/19. He is currently on IV ampicillin- Day 17. Will discontinue IV ( remove PICC) and start him on oral ampicillin 500mg  Q 6 for 10 days . He will have the stent removed on 07/20/19 by Dr.Sninsky.

## 2019-07-14 NOTE — Telephone Encounter (Signed)
Spoke with patient's son, ok per DPR. Patient said since calling us he received call from infectious disease regarding the PICC line and antibiotics which will now be discontinued.  Son is also wondering if the f/u with cardiology is still needed because the initial visit was only for clearance for surgery and "everything checked out." Advised it is good to have follow up to reevaluate and determine plan of care from this point. Son would still like to know from Dr End if f/u is needed at this time. Routing to Dr End.

## 2019-07-19 ENCOUNTER — Ambulatory Visit: Payer: Medicare HMO | Admitting: Physician Assistant

## 2019-07-19 ENCOUNTER — Telehealth: Payer: Self-pay | Admitting: Infectious Diseases

## 2019-07-19 NOTE — Telephone Encounter (Signed)
Per Dr. Alfonso Patten patient needs a televisit schedule for Thursday. Called and left patient a message to call back to schedule appt   Thanks

## 2019-07-19 NOTE — Telephone Encounter (Signed)
Lm x

## 2019-07-20 ENCOUNTER — Encounter: Payer: Self-pay | Admitting: Urology

## 2019-07-20 ENCOUNTER — Other Ambulatory Visit: Payer: Self-pay

## 2019-07-20 ENCOUNTER — Ambulatory Visit (INDEPENDENT_AMBULATORY_CARE_PROVIDER_SITE_OTHER): Payer: Medicare HMO | Admitting: Urology

## 2019-07-20 ENCOUNTER — Ambulatory Visit: Payer: Medicare HMO | Admitting: Physician Assistant

## 2019-07-20 VITALS — BP 144/95 | HR 96 | Ht 69.0 in | Wt 244.0 lb

## 2019-07-20 DIAGNOSIS — N200 Calculus of kidney: Secondary | ICD-10-CM

## 2019-07-20 MED ORDER — POTASSIUM CITRATE ER 15 MEQ (1620 MG) PO TBCR: 2.0000 | EXTENDED_RELEASE_TABLET | Freq: Two times a day (BID) | ORAL | 6 refills | Status: DC

## 2019-07-20 NOTE — Addendum Note (Signed)
Addended by: Chrystie Nose on: 07/20/2019 04:25 PM   Modules accepted: Orders

## 2019-07-20 NOTE — Progress Notes (Signed)
Cystoscopy Procedure Note:  Indication: Stent removal s/p staged URS/LL for right staghorn uric acid stone  After informed consent and discussion of the procedure and its risks, Jonathan Cross. was positioned and prepped in the standard fashion. Cystoscopy was performed with a flexible cystoscope. The stent was grasped with flexible graspers and removed in its entirety. The patient tolerated the procedure well.  Findings: Uncomplicated stent removal  Assessment and Plan: Follow up in 4 weeks with renal ultrasound to evaluate for silent hydronephrosis Continue K Citrate alkalinization to prevent recurrent uric acid stones  Billey Co, MD 07/20/2019

## 2019-07-21 ENCOUNTER — Ambulatory Visit: Payer: Medicare HMO | Attending: Infectious Diseases | Admitting: Infectious Diseases

## 2019-07-21 ENCOUNTER — Encounter: Payer: Self-pay | Admitting: Infectious Diseases

## 2019-07-21 DIAGNOSIS — B952 Enterococcus as the cause of diseases classified elsewhere: Secondary | ICD-10-CM | POA: Diagnosis not present

## 2019-07-21 DIAGNOSIS — N390 Urinary tract infection, site not specified: Secondary | ICD-10-CM | POA: Diagnosis not present

## 2019-07-21 DIAGNOSIS — R7881 Bacteremia: Secondary | ICD-10-CM

## 2019-07-21 DIAGNOSIS — Z792 Long term (current) use of antibiotics: Secondary | ICD-10-CM

## 2019-07-21 DIAGNOSIS — R319 Hematuria, unspecified: Secondary | ICD-10-CM

## 2019-07-21 DIAGNOSIS — Z87442 Personal history of urinary calculi: Secondary | ICD-10-CM

## 2019-07-21 DIAGNOSIS — N201 Calculus of ureter: Secondary | ICD-10-CM

## 2019-07-21 NOTE — Progress Notes (Signed)
The purpose of this virtual visit is to provide medical care while limiting exposure to the novel coronavirus (COVID19) for both patient and office staff.   Consent was obtained for phone visit:  Yes.   Answered questions that patient had about telehealth interaction:  Yes.   I discussed the limitations, risks, security and privacy concerns of performing an evaluation and management service by telephone. I also discussed with the patient that there may be a patient responsible charge related to this service. The patient expressed understanding and agreed to proceed.   Patient Location: Home Provider Location:office  Called patient to find out about his UTI and enterococcus bacteremia and how he was doing- he had completed 2 weeks of Iv antibiotic and has ben taking ampicillin 500mg  every 6 hrs- he had stone and stent and the stent has been removed yesterday and the he had lithotripsy on 07/11/19. Pt has been doing well and has gone back to work-  He says he takes the ampicillin three times a day as he misses the night dose. He will complete the antibiotic in 3 days has some soft stool  but not frequent or loose, No fever, hematuria or flank pain-  Pt will call me if needed

## 2019-08-09 ENCOUNTER — Other Ambulatory Visit: Payer: Self-pay

## 2019-08-09 MED ORDER — LISINOPRIL 20 MG PO TABS: 20.0000 mg | ORAL_TABLET | Freq: Every day | ORAL | 0 refills | Status: DC

## 2019-08-11 ENCOUNTER — Telehealth: Payer: Self-pay

## 2019-08-11 ENCOUNTER — Other Ambulatory Visit: Payer: Self-pay | Admitting: Infectious Diseases

## 2019-08-11 ENCOUNTER — Other Ambulatory Visit
Admission: RE | Admit: 2019-08-11 | Discharge: 2019-08-11 | Disposition: A | Discharge status: Home / Self Care | Payer: Medicare HMO | Source: Home / Self Care | Attending: Infectious Diseases | Admitting: Infectious Diseases

## 2019-08-11 DIAGNOSIS — N390 Urinary tract infection, site not specified: Secondary | ICD-10-CM

## 2019-08-11 DIAGNOSIS — R7881 Bacteremia: Secondary | ICD-10-CM | POA: Diagnosis not present

## 2019-08-11 DIAGNOSIS — T826XXA Infection and inflammatory reaction due to cardiac valve prosthesis, initial encounter: Secondary | ICD-10-CM | POA: Diagnosis not present

## 2019-08-11 LAB — COMPREHENSIVE METABOLIC PANEL
ALT: 40 U/L (ref 0–44)
AST: 33 U/L (ref 15–41)
Albumin: 3.9 g/dL (ref 3.5–5.0)
Alkaline Phosphatase: 48 U/L (ref 38–126)
Anion gap: 8 (ref 5–15)
BUN: 17 mg/dL (ref 8–23)
CO2: 24 mmol/L (ref 22–32)
Calcium: 9.5 mg/dL (ref 8.9–10.3)
Chloride: 101 mmol/L (ref 98–111)
Creatinine, Ser: 1.08 mg/dL (ref 0.61–1.24)
GFR calc Af Amer: >60 mL/min (ref >60)
GFR calc non Af Amer: >60 mL/min (ref >60)
Glucose, Bld: 159 mg/dL — ABNORMAL HIGH (ref 70–99)
Potassium: 4.7 mmol/L (ref 3.5–5.1)
Sodium: 133 mmol/L — ABNORMAL LOW (ref 135–145)
Total Bilirubin: 0.8 mg/dL (ref 0.3–1.2)
Total Protein: 7.5 g/dL (ref 6.5–8.1)

## 2019-08-11 LAB — CBC WITH DIFFERENTIAL/PLATELET
Abs Immature Granulocytes: 0.04 10*3/uL (ref 0.00–0.07)
Basophils Absolute: 0 10*3/uL (ref 0.0–0.1)
Basophils Relative: 0 %
Eosinophils Absolute: 0.1 10*3/uL (ref 0.0–0.5)
Eosinophils Relative: 2 %
HCT: 34.9 % — ABNORMAL LOW (ref 39.0–52.0)
Hemoglobin: 11.6 g/dL — ABNORMAL LOW (ref 13.0–17.0)
Immature Granulocytes: 1 %
Lymphocytes Relative: 38 %
Lymphs Abs: 1.7 10*3/uL (ref 0.7–4.0)
MCH: 34.8 pg — ABNORMAL HIGH (ref 26.0–34.0)
MCHC: 33.2 g/dL (ref 30.0–36.0)
MCV: 104.8 fL — ABNORMAL HIGH (ref 80.0–100.0)
Monocytes Absolute: 0.6 10*3/uL (ref 0.1–1.0)
Monocytes Relative: 13 %
Neutro Abs: 2.1 10*3/uL (ref 1.7–7.7)
Neutrophils Relative %: 46 %
Platelets: 129 10*3/uL — ABNORMAL LOW (ref 150–400)
RBC: 3.33 MIL/uL — ABNORMAL LOW (ref 4.22–5.81)
RDW: 15.5 % (ref 11.5–15.5)
WBC: 4.6 10*3/uL (ref 4.0–10.5)
nRBC: 0 % (ref 0.0–0.2)

## 2019-08-11 MED ORDER — AMOXICILLIN 500 MG PO CAPS: 500.0000 mg | ORAL_CAPSULE | Freq: Three times a day (TID) | ORAL | 0 refills | Status: DC

## 2019-08-11 NOTE — Progress Notes (Signed)
Pt called c/o fever He has h/o enterococcus bacteremia and ureteral stones  Followed by Dr.sninsky On 1/7 he had stent removed He completed antibiotics on 07/24/19 Will get Uc, BC, CBC, CMP and after that will start on antibioitcs- ? Po ampicillin

## 2019-08-11 NOTE — Progress Notes (Signed)
Called patient and spoke to him and his son-- he was c/o fever- as per his son he had temp of 101 2 days ago and 101.5 yesterday. He has bene taking Ibuprofen and today it is 99.5 He has h/o enterococcus bacteremia and renal stone He did labs this morning- CBC/CMP okay Blood culture and urine culture pending- will send amoxicillin 500mg  TID for 7 days Will follow culture Asked patient to report if temp > 101 or other symptoms

## 2019-08-11 NOTE — Telephone Encounter (Signed)
Son calling to report fever controlled with Tylenol. 100-102 with recent hx of Bacteremia/UTI. Alerted Dr.Ravishankar and she has ordered labs. Instructed to come to [facility] and check in at Lab and we will call with results. Also went over sepsis signs and what to do if worsens given he was hospitalized twice recently.

## 2019-08-12 LAB — URINE CULTURE: Culture: NO GROWTH

## 2019-08-13 ENCOUNTER — Inpatient Hospital Stay
Admission: EM | Admit: 2019-08-13 | Discharge: 2019-08-18 | DRG: 314 | Disposition: A | Discharge status: Home Health | Payer: Medicare HMO | Attending: Student | Admitting: Student

## 2019-08-13 ENCOUNTER — Emergency Department: Payer: Medicare HMO

## 2019-08-13 ENCOUNTER — Other Ambulatory Visit: Payer: Self-pay

## 2019-08-13 ENCOUNTER — Encounter: Payer: Self-pay | Admitting: Emergency Medicine

## 2019-08-13 ENCOUNTER — Encounter: Payer: Self-pay | Admitting: Infectious Diseases

## 2019-08-13 DIAGNOSIS — I358 Other nonrheumatic aortic valve disorders: Secondary | ICD-10-CM

## 2019-08-13 DIAGNOSIS — Z888 Allergy status to other drugs, medicaments and biological substances status: Secondary | ICD-10-CM | POA: Diagnosis not present

## 2019-08-13 DIAGNOSIS — Z9049 Acquired absence of other specified parts of digestive tract: Secondary | ICD-10-CM

## 2019-08-13 DIAGNOSIS — Z7982 Long term (current) use of aspirin: Secondary | ICD-10-CM

## 2019-08-13 DIAGNOSIS — K2100 Gastro-esophageal reflux disease with esophagitis, without bleeding: Secondary | ICD-10-CM | POA: Diagnosis not present

## 2019-08-13 DIAGNOSIS — G8929 Other chronic pain: Secondary | ICD-10-CM | POA: Diagnosis present

## 2019-08-13 DIAGNOSIS — Z8619 Personal history of other infectious and parasitic diseases: Secondary | ICD-10-CM | POA: Diagnosis not present

## 2019-08-13 DIAGNOSIS — D649 Anemia, unspecified: Secondary | ICD-10-CM | POA: Diagnosis present

## 2019-08-13 DIAGNOSIS — R7881 Bacteremia: Secondary | ICD-10-CM | POA: Diagnosis present

## 2019-08-13 DIAGNOSIS — Y831 Surgical operation with implant of artificial internal device as the cause of abnormal reaction of the patient, or of later complication, without mention of misadventure at the time of the procedure: Secondary | ICD-10-CM | POA: Diagnosis present

## 2019-08-13 DIAGNOSIS — Z792 Long term (current) use of antibiotics: Secondary | ICD-10-CM

## 2019-08-13 DIAGNOSIS — N281 Cyst of kidney, acquired: Secondary | ICD-10-CM | POA: Diagnosis present

## 2019-08-13 DIAGNOSIS — I2510 Atherosclerotic heart disease of native coronary artery without angina pectoris: Secondary | ICD-10-CM | POA: Diagnosis present

## 2019-08-13 DIAGNOSIS — Z87442 Personal history of urinary calculi: Secondary | ICD-10-CM

## 2019-08-13 DIAGNOSIS — N202 Calculus of kidney with calculus of ureter: Secondary | ICD-10-CM | POA: Diagnosis not present

## 2019-08-13 DIAGNOSIS — Z794 Long term (current) use of insulin: Secondary | ICD-10-CM | POA: Diagnosis not present

## 2019-08-13 DIAGNOSIS — Z20822 Contact with and (suspected) exposure to covid-19: Secondary | ICD-10-CM | POA: Diagnosis present

## 2019-08-13 DIAGNOSIS — T826XXA Infection and inflammatory reaction due to cardiac valve prosthesis, initial encounter: Secondary | ICD-10-CM | POA: Diagnosis present

## 2019-08-13 DIAGNOSIS — A419 Sepsis, unspecified organism: Secondary | ICD-10-CM

## 2019-08-13 DIAGNOSIS — E785 Hyperlipidemia, unspecified: Secondary | ICD-10-CM | POA: Diagnosis present

## 2019-08-13 DIAGNOSIS — Z952 Presence of prosthetic heart valve: Secondary | ICD-10-CM | POA: Diagnosis not present

## 2019-08-13 DIAGNOSIS — I330 Acute and subacute infective endocarditis: Secondary | ICD-10-CM | POA: Diagnosis present

## 2019-08-13 DIAGNOSIS — B952 Enterococcus as the cause of diseases classified elsewhere: Secondary | ICD-10-CM | POA: Diagnosis present

## 2019-08-13 DIAGNOSIS — Y712 Prosthetic and other implants, materials and accessory cardiovascular devices associated with adverse incidents: Secondary | ICD-10-CM | POA: Diagnosis present

## 2019-08-13 DIAGNOSIS — K219 Gastro-esophageal reflux disease without esophagitis: Secondary | ICD-10-CM | POA: Diagnosis present

## 2019-08-13 DIAGNOSIS — D696 Thrombocytopenia, unspecified: Secondary | ICD-10-CM | POA: Diagnosis present

## 2019-08-13 DIAGNOSIS — Z8249 Family history of ischemic heart disease and other diseases of the circulatory system: Secondary | ICD-10-CM | POA: Diagnosis not present

## 2019-08-13 DIAGNOSIS — E871 Hypo-osmolality and hyponatremia: Secondary | ICD-10-CM | POA: Diagnosis present

## 2019-08-13 DIAGNOSIS — Z87891 Personal history of nicotine dependence: Secondary | ICD-10-CM

## 2019-08-13 DIAGNOSIS — I350 Nonrheumatic aortic (valve) stenosis: Secondary | ICD-10-CM | POA: Diagnosis not present

## 2019-08-13 DIAGNOSIS — E119 Type 2 diabetes mellitus without complications: Secondary | ICD-10-CM | POA: Diagnosis present

## 2019-08-13 DIAGNOSIS — Z9089 Acquired absence of other organs: Secondary | ICD-10-CM

## 2019-08-13 DIAGNOSIS — Z951 Presence of aortocoronary bypass graft: Secondary | ICD-10-CM

## 2019-08-13 DIAGNOSIS — I25118 Atherosclerotic heart disease of native coronary artery with other forms of angina pectoris: Secondary | ICD-10-CM | POA: Diagnosis not present

## 2019-08-13 DIAGNOSIS — I10 Essential (primary) hypertension: Secondary | ICD-10-CM | POA: Diagnosis present

## 2019-08-13 DIAGNOSIS — Z79899 Other long term (current) drug therapy: Secondary | ICD-10-CM | POA: Diagnosis not present

## 2019-08-13 DIAGNOSIS — N400 Enlarged prostate without lower urinary tract symptoms: Secondary | ICD-10-CM | POA: Diagnosis present

## 2019-08-13 DIAGNOSIS — G4733 Obstructive sleep apnea (adult) (pediatric): Secondary | ICD-10-CM | POA: Diagnosis present

## 2019-08-13 DIAGNOSIS — Z87440 Personal history of urinary (tract) infections: Secondary | ICD-10-CM | POA: Diagnosis not present

## 2019-08-13 DIAGNOSIS — Z95820 Peripheral vascular angioplasty status with implants and grafts: Secondary | ICD-10-CM | POA: Diagnosis not present

## 2019-08-13 DIAGNOSIS — Z79891 Long term (current) use of opiate analgesic: Secondary | ICD-10-CM

## 2019-08-13 LAB — URINALYSIS, COMPLETE (UACMP) WITH MICROSCOPIC
Bacteria, UA: NONE SEEN
Bilirubin Urine: NEGATIVE
Glucose, UA: 50 mg/dL — AB
Hgb urine dipstick: NEGATIVE
Ketones, ur: NEGATIVE mg/dL
Leukocytes,Ua: NEGATIVE
Nitrite: NEGATIVE
Protein, ur: NEGATIVE mg/dL
Specific Gravity, Urine: 1.011 (ref 1.005–1.030)
pH: 5 (ref 5.0–8.0)

## 2019-08-13 LAB — BLOOD CULTURE ID PANEL (REFLEXED)

## 2019-08-13 LAB — CBC WITH DIFFERENTIAL/PLATELET
Abs Immature Granulocytes: 0.05 10*3/uL (ref 0.00–0.07)
Basophils Absolute: 0 10*3/uL (ref 0.0–0.1)
Basophils Relative: 1 %
Eosinophils Absolute: 0.1 10*3/uL (ref 0.0–0.5)
Eosinophils Relative: 2 %
HCT: 33 % — ABNORMAL LOW (ref 39.0–52.0)
Hemoglobin: 11 g/dL — ABNORMAL LOW (ref 13.0–17.0)
Immature Granulocytes: 1 %
Lymphocytes Relative: 33 %
Lymphs Abs: 1.4 10*3/uL (ref 0.7–4.0)
MCH: 34.5 pg — ABNORMAL HIGH (ref 26.0–34.0)
MCHC: 33.3 g/dL (ref 30.0–36.0)
MCV: 103.4 fL — ABNORMAL HIGH (ref 80.0–100.0)
Monocytes Absolute: 0.6 10*3/uL (ref 0.1–1.0)
Monocytes Relative: 14 %
Neutro Abs: 2.1 10*3/uL (ref 1.7–7.7)
Neutrophils Relative %: 49 %
Platelets: 128 10*3/uL — ABNORMAL LOW (ref 150–400)
RBC: 3.19 MIL/uL — ABNORMAL LOW (ref 4.22–5.81)
RDW: 15.5 % (ref 11.5–15.5)
WBC: 4.3 10*3/uL (ref 4.0–10.5)
nRBC: 0 % (ref 0.0–0.2)

## 2019-08-13 LAB — COMPREHENSIVE METABOLIC PANEL
ALT: 33 U/L (ref 0–44)
AST: 28 U/L (ref 15–41)
Albumin: 3.6 g/dL (ref 3.5–5.0)
Alkaline Phosphatase: 58 U/L (ref 38–126)
Anion gap: 9 (ref 5–15)
BUN: 20 mg/dL (ref 8–23)
CO2: 22 mmol/L (ref 22–32)
Calcium: 9 mg/dL (ref 8.9–10.3)
Chloride: 101 mmol/L (ref 98–111)
Creatinine, Ser: 1.12 mg/dL (ref 0.61–1.24)
GFR calc Af Amer: >60 mL/min (ref >60)
GFR calc non Af Amer: >60 mL/min (ref >60)
Glucose, Bld: 274 mg/dL — ABNORMAL HIGH (ref 70–99)
Potassium: 4.4 mmol/L (ref 3.5–5.1)
Sodium: 132 mmol/L — ABNORMAL LOW (ref 135–145)
Total Bilirubin: 1 mg/dL (ref 0.3–1.2)
Total Protein: 7.4 g/dL (ref 6.5–8.1)

## 2019-08-13 LAB — PROTIME-INR
INR: 1 (ref 0.8–1.2)
Prothrombin Time: 13.4 seconds (ref 11.4–15.2)

## 2019-08-13 LAB — GLUCOSE, CAPILLARY
Glucose-Capillary: 155 mg/dL — ABNORMAL HIGH (ref 70–99)
Glucose-Capillary: 166 mg/dL — ABNORMAL HIGH (ref 70–99)
Glucose-Capillary: 173 mg/dL — ABNORMAL HIGH (ref 70–99)

## 2019-08-13 LAB — LACTIC ACID, PLASMA
Lactic Acid, Venous: 2.1 mmol/L (ref 0.5–1.9)
Lactic Acid, Venous: 1.7 mmol/L (ref 0.5–1.9)

## 2019-08-13 LAB — RESPIRATORY PANEL BY RT PCR (FLU A&B, COVID)
Influenza A by PCR: NEGATIVE
Influenza B by PCR: NEGATIVE
SARS Coronavirus 2 by RT PCR: NEGATIVE

## 2019-08-13 LAB — APTT: aPTT: 34 seconds (ref 24–36)

## 2019-08-13 MED ORDER — HEPARIN SODIUM (PORCINE) 5000 UNIT/ML IJ SOLN
5000.0000 [IU] | Freq: Three times a day (TID) | INTRAMUSCULAR | Status: DC
  Administered 2019-08-13 – 2019-08-18 (×16): 5000 [IU] via SUBCUTANEOUS
  Filled 2019-08-13 (×16): qty 1

## 2019-08-13 MED ORDER — HYDRALAZINE HCL 25 MG PO TABS: 25.0000 mg | ORAL_TABLET | Freq: Three times a day (TID) | ORAL | Status: DC | PRN

## 2019-08-13 MED ORDER — ONDANSETRON HCL 4 MG/2ML IJ SOLN: 4.0000 mg | Freq: Three times a day (TID) | INTRAMUSCULAR | Status: DC | PRN

## 2019-08-13 MED ORDER — SODIUM CHLORIDE 0.9 % IV SOLN: INTRAVENOUS | Status: DC

## 2019-08-13 MED ORDER — ISOSORBIDE MONONITRATE ER 60 MG PO TB24
60.0000 mg | ORAL_TABLET | Freq: Every day | ORAL | Status: DC
  Administered 2019-08-14 – 2019-08-18 (×5): 60 mg via ORAL
  Filled 2019-08-13 (×5): qty 1

## 2019-08-13 MED ORDER — METOPROLOL SUCCINATE ER 25 MG PO TB24
25.0000 mg | ORAL_TABLET | Freq: Every day | ORAL | Status: DC
  Administered 2019-08-14 – 2019-08-18 (×5): 25 mg via ORAL
  Filled 2019-08-13 (×5): qty 1

## 2019-08-13 MED ORDER — GENTAMICIN SULFATE 40 MG/ML IJ SOLN: 2.0000 mg/kg | Freq: Once | INTRAVENOUS | Status: DC

## 2019-08-13 MED ORDER — SENNOSIDES-DOCUSATE SODIUM 8.6-50 MG PO TABS: 1.0000 | ORAL_TABLET | Freq: Every evening | ORAL | Status: DC | PRN

## 2019-08-13 MED ORDER — SODIUM CHLORIDE 0.9 % IV SOLN: 1.0000 g | Freq: Once | INTRAVENOUS | Status: DC

## 2019-08-13 MED ORDER — ASPIRIN EC 81 MG PO TBEC
81.0000 mg | DELAYED_RELEASE_TABLET | ORAL | Status: DC
  Administered 2019-08-15 – 2019-08-17 (×2): 81 mg via ORAL
  Filled 2019-08-13 (×2): qty 1

## 2019-08-13 MED ORDER — AMLODIPINE BESYLATE 5 MG PO TABS
5.0000 mg | ORAL_TABLET | Freq: Every day | ORAL | Status: DC
  Administered 2019-08-14 – 2019-08-18 (×5): 5 mg via ORAL
  Filled 2019-08-13 (×5): qty 1

## 2019-08-13 MED ORDER — GENTAMICIN SULFATE 40 MG/ML IJ SOLN
1.0000 mg/kg | Freq: Three times a day (TID) | INTRAVENOUS | Status: DC
  Administered 2019-08-13 – 2019-08-14 (×3): 110 mg via INTRAVENOUS
  Filled 2019-08-13 (×4): qty 2.75

## 2019-08-13 MED ORDER — SODIUM CHLORIDE 0.9 % IV SOLN
2.0000 g | INTRAVENOUS | Status: DC
  Administered 2019-08-13 – 2019-08-18 (×30): 2 g via INTRAVENOUS
  Filled 2019-08-13 (×6): qty 2
  Filled 2019-08-13: qty 2000
  Filled 2019-08-13 (×5): qty 2
  Filled 2019-08-13: qty 2000
  Filled 2019-08-13: qty 2
  Filled 2019-08-13: qty 2000
  Filled 2019-08-13 (×3): qty 2
  Filled 2019-08-13 (×4): qty 2000
  Filled 2019-08-13 (×3): qty 2
  Filled 2019-08-13: qty 2000
  Filled 2019-08-13: qty 2
  Filled 2019-08-13: qty 2000
  Filled 2019-08-13: qty 2
  Filled 2019-08-13 (×2): qty 2000
  Filled 2019-08-13 (×6): qty 2

## 2019-08-13 MED ORDER — GENTAMICIN SULFATE 40 MG/ML IJ SOLN
1.0000 mg/kg | Freq: Once | INTRAVENOUS | Status: AC
  Administered 2019-08-13: 110 mg via INTRAVENOUS
  Filled 2019-08-13: qty 2.75

## 2019-08-13 MED ORDER — SODIUM CHLORIDE 0.9 % IV BOLUS
1000.0000 mL | Freq: Once | INTRAVENOUS | Status: AC
  Administered 2019-08-13: 1000 mL via INTRAVENOUS

## 2019-08-13 MED ORDER — ACETAMINOPHEN 325 MG PO TABS: 650.0000 mg | ORAL_TABLET | Freq: Four times a day (QID) | ORAL | Status: DC | PRN

## 2019-08-13 MED ORDER — PANTOPRAZOLE SODIUM 40 MG PO TBEC
40.0000 mg | DELAYED_RELEASE_TABLET | Freq: Every day | ORAL | Status: DC
  Administered 2019-08-13 – 2019-08-18 (×6): 40 mg via ORAL
  Filled 2019-08-13 (×6): qty 1

## 2019-08-13 MED ORDER — NITROGLYCERIN 0.4 MG SL SUBL: 0.4000 mg | SUBLINGUAL_TABLET | SUBLINGUAL | Status: DC | PRN

## 2019-08-13 MED ORDER — SODIUM CHLORIDE 0.9 % IV SOLN
2.0000 g | Freq: Once | INTRAVENOUS | Status: AC
  Administered 2019-08-13: 2 g via INTRAVENOUS
  Filled 2019-08-13: qty 2000

## 2019-08-13 MED ORDER — INSULIN ASPART PROT & ASPART (70-30 MIX) 100 UNIT/ML ~~LOC~~ SUSP
60.0000 [IU] | Freq: Two times a day (BID) | SUBCUTANEOUS | Status: DC
  Administered 2019-08-13 – 2019-08-18 (×10): 60 [IU] via SUBCUTANEOUS
  Filled 2019-08-13 (×11): qty 10

## 2019-08-13 MED ORDER — ATORVASTATIN CALCIUM 80 MG PO TABS
80.0000 mg | ORAL_TABLET | Freq: Every day | ORAL | Status: DC
  Administered 2019-08-13 – 2019-08-18 (×6): 80 mg via ORAL
  Filled 2019-08-13 (×2): qty 1
  Filled 2019-08-13: qty 4
  Filled 2019-08-13 (×3): qty 1
  Filled 2019-08-13: qty 4
  Filled 2019-08-13: qty 1
  Filled 2019-08-13 (×3): qty 4

## 2019-08-13 MED ORDER — GUAIFENESIN-DM 100-10 MG/5ML PO SYRP
5.0000 mL | ORAL_SOLUTION | ORAL | Status: DC | PRN
  Administered 2019-08-13 – 2019-08-15 (×3): 5 mL via ORAL
  Filled 2019-08-13 (×3): qty 5

## 2019-08-13 MED ORDER — LORATADINE 10 MG PO TABS
10.0000 mg | ORAL_TABLET | Freq: Every day | ORAL | Status: DC
  Administered 2019-08-14 – 2019-08-18 (×5): 10 mg via ORAL
  Filled 2019-08-13 (×5): qty 1

## 2019-08-13 MED ORDER — INSULIN ASPART 100 UNIT/ML ~~LOC~~ SOLN
0.0000 [IU] | SUBCUTANEOUS | Status: DC
  Administered 2019-08-13 – 2019-08-14 (×4): 2 [IU] via SUBCUTANEOUS
  Administered 2019-08-14: 1 [IU] via SUBCUTANEOUS
  Administered 2019-08-14 (×2): 2 [IU] via SUBCUTANEOUS
  Filled 2019-08-13 (×7): qty 1

## 2019-08-13 NOTE — Progress Notes (Addendum)
Lab called this morning regarding blood cultures sent on 08/11/19 as OP was positive for enterococcus (4/4) Pt has h/o enterococcus bacteremia X3  secondary to infected renal stone and has undergone multiple procedures and stents . Currently he has no stent. He has a prosthetic Aortic valve and TEE done twice before was negative On Thursday he called that he was having fever and , no hematuria, mild flank pain which resolved with ibuprofen- Blood culture, urine culture, cbc, cmp were sent. He was given amoxicillin 500mg  po Q 8 UC -Neg, CBC, okay, cr 1.08.  Blood culture repeated as enterococcus BCID today Called son and he told me that patient started amoxicillin only last night as he was still having fever. I asked him to bring his dad to the ED for admission.  Spoke to [place] in the ED Spoke to pharmacist Barbaraann Rondo Pt to be started on IV Amp+ gent combo ( rather than AMP+ ceftriaxone as previously discussed if creatinine okay) . Creatinine and levels to be monitored closely.He will need 6 weeks of Iv antibiotics (and gent will be switched to ceftriaxone later) Ultrasound kidney to look for any hydro  PICC line cannot be placed until he clears the bacteremia May need urology consult if hydronephrosis seen

## 2019-08-13 NOTE — ED Triage Notes (Addendum)
First RN Note: Pt presents to ED via POV with family member. Per family member pt was sent back to ED for admission due to positive blood cultures. Upon review of chart pt's blood cultures positive for enterococcus bacteria.

## 2019-08-13 NOTE — Consult Note (Signed)
Pharmacy Antibiotic Note  Jonathan Cross. is a 74 y.o. male admitted on 08/13/2019 with bacteremia.  Pharmacy has been consulted for ampicillin and gentamicin dosing. Blood cultures from 1/28 have been reported 4/4 Enterococcus with a h/o enterococcal bacteremia. Based on ID recommendations we are starting ampicillin + gentamicin for synergy  Plan: 1) start ampicillin 2 grams IV every 4 hours  2) start gentamicin 1 mg/kg every 8 hours   Follow renal function closely: SCr in am  Gentamicin peak and trough following the 3rd dose  Goal gentamicin levels:   Peak: 3 - 5 mg/L  Trough: <1 mg/L  Height: 5\' 9"  (175.3 cm) Weight: 245 lb (111.1 kg) IBW/kg (Calculated) : 70.7  Temp (24hrs), Avg:98.6 F (37 C), Min:98.6 F (37 C), Max:98.6 F (37 C)  Recent Labs  Lab 08/11/19 1414  WBC 4.6  CREATININE 1.08    Estimated Creatinine Clearance: 72.6 mL/min (by C-G formula based on SCr of 1.08 mg/dL).    Antimicrobials this admission: 1/30 ampicillin >>  1/30 gentamicin >>   Microbiology results: 1/28 BCx: Enterococcus sp 4/4 1/28 UCx: pending   Thank you for allowing pharmacy to be a part of this patient's care.  Dallie Piles 08/13/2019 10:48 AM

## 2019-08-13 NOTE — ED Notes (Signed)
Ultrasound and hospitalist at bedside at this time for assessment.

## 2019-08-13 NOTE — ED Provider Notes (Signed)
[facility] Emergency Department Provider Note   ____________________________________________    I have reviewed the triage vital signs and the nursing notes.   HISTORY  Chief Complaint Abnormal Lab     HPI Jonathan Cross. is a 74 y.o. male with a history of diabetes, CAD, hypertension who presents today because of positive outpatient blood cultures.  His infectious disease physician Dr. Steva Ready referred him to the emergency department because of reported positive Enterococcus on blood cultures.  Patient reports he had fever chills and fatigue developed several days ago.  Review of records demonstrate the patient has had Enterococcus sepsis in the past, related to kidney stones however he has no flank pain or typical kidney stone symptoms that he has had in the past.  Does report a cough which she has had for about a week.  Past Medical History:  Diagnosis Date  . Coronary artery disease   . Diabetes ([place])   . H/O aortic valve replacement   . History of kidney stones   . Hx of CABG   . Hyperlipidemia   . Hypertension   . Sleep apnea     Patient Active Problem List   Diagnosis Date Noted  . Hypoalbuminemia 06/29/2019  . Hyperglycemia 06/29/2019  . Lactic acidosis 06/29/2019  . GERD (gastroesophageal reflux disease) 06/29/2019  . 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 Nonrheumatic aortic valve stenosis 05/13/2019  . Hyperlipidemia LDL goal <70 05/13/2019  . Preop cardiovascular exam 05/13/2019  . Bacteremia 04/18/2019  . Class 2 severe obesity due to excess calories with serious comorbidity and body mass index (BMI) of 38.0 to 38.9 in adult ([place]) 03/24/2018  . History of aortic valve replacement with bioprosthetic valve 08/07/2016  . Status post coronary artery bypass graft 08/07/2016  . Coronary artery disease of native artery of native heart with stable angina pectoris ([place]) 02/11/2016  . SVT (supraventricular tachycardia) ([place])  07/31/2015  . Type 2 diabetes mellitus without complication, with long-term current use of insulin ([place]) 03/28/2015  . Benign prostatic hyperplasia 04/07/2014  . OSA (obstructive sleep apnea) 04/07/2014  . Essential hypertension 05/16/2009    Past Surgical History:  Procedure Laterality Date  . AORTIC VALVE REPLACEMENT  2017   [facility], bioprosthetic  . APPENDECTOMY    . CARDIAC VALVE REPLACEMENT    . CORONARY ARTERY BYPASS GRAFT  2017   [facility] - [facility] and SVG-OM  . CYSTOSCOPY W/ RETROGRADES Right 05/27/2019   Procedure: CYSTOSCOPY WITH RETROGRADE PYELOGRAM;  Surgeon: Billey Co, MD;  Location: [facility] ORS;  Service: Urology;  Laterality: Right;  . CYSTOSCOPY W/ RETROGRADES Right 07/11/2019   Procedure: CYSTOSCOPY WITH RETROGRADE PYELOGRAM;  Surgeon: Billey Co, MD;  Location: [facility] OR[facility];  Service: Urology;  Laterality: Right;  . CYSTOSCOPY/URETEROSCOPY/HOLMIUM LASER/STENT PLACEMENT Right 05/27/2019   Procedure: CYSTOSCOPY/URETEROSCOPY/HOLMIUM LASER/STENT PLACEMENT;  Surgeon: Billey Co, MD;  Location: [facility] ORS;  Service: Urology;  Laterality: Right;  . CYSTOSCOPY/URETEROSCOPY/HOLMIUM LASER/STENT PLACEMENT Right 06/17/2019   Procedure: CYSTOSCOPY/URETEROSCOPY/HOLMIUM LASER/STENT Exchange;  Surgeon: Billey Co, MD;  Location: [facility] OR[facility];  Service: Urology;  Laterality: Right;  . CYSTOSCOPY/URETEROSCOPY/HOLMIUM LASER/STENT PLACEMENT Right 07/11/2019   Procedure: CYSTOSCOPY/URETEROSCOPY/HOLMIUM LASER/STENT Exchange;  Surgeon: Billey Co, MD;  Location: [facility] OR[facility];  Service: Urology;  Laterality: Right;  . STONE EXTRACTION WITH BASKET Right 07/11/2019   Procedure: STONE EXTRACTION WITH BASKET;  Surgeon: Billey Co, MD;  Location: [facility] OR[facility];  Service: Urology;  Laterality: Right;  . TEE WITHOUT CARDIOVERSION N/A 04/21/2019  Procedure: TRANSESOPHAGEAL ECHOCARDIOGRAM (TEE);  Surgeon: Minna Merritts, MD;  Location: [facility] OR[facility];  Service: Cardiovascular;  Laterality: N/A;  .  TEE WITHOUT CARDIOVERSION N/A 07/04/2019   Procedure: TRANSESOPHAGEAL ECHOCARDIOGRAM (TEE);  Surgeon: Wellington Hampshire, MD;  Location: [facility] OR[facility];  Service: Cardiovascular;  Laterality: N/A;  . TONSILLECTOMY      Prior to Admission medications   Medication Sig Start Date End Date Taking? Authorizing Provider  amLODipine (NORVASC) 5 MG tablet Take 5 mg by mouth daily. 03/08/19   [provider]  amoxicillin (AMOXIL) 500 MG capsule Take 1 capsule (500 mg total) by mouth 3 (three) times daily. 08/11/19   Tsosie Billing, MD  ampicillin (PRINCIPEN) 500 MG capsule Take 1 capsule (500 mg total) by mouth 4 (four) times daily. 07/14/19   Tsosie Billing, MD  aspirin EC 81 MG tablet Take 81 mg by mouth 3 (three) times a week.  05/30/14   [provider]  atorvastatin (LIPITOR) 80 MG tablet Take 80 mg by mouth daily. 08/13/18   [provider]  glipiZIDE (GLUCOTROL XL) 2.5 MG 24 hr tablet Take 2.5 mg by mouth daily with breakfast.  05/05/19   [provider]  insulin aspart (NOVOLOG) 100 UNIT/ML injection Inject 0-5 Units into the skin at bedtime. 04/23/19   Epifanio Lesches, MD  isosorbide mononitrate (IMDUR) 60 MG 24 hr tablet Take 1 tablet (60 mg total) by mouth daily. 05/12/19 08/10/19  End, Harrell Gave, MD  lisinopril (ZESTRIL) 20 MG tablet Take 1 tablet (20 mg total) by mouth daily. Please call to schedule appointment for further refills. Thank you! 08/09/19 05/05/20  End, Harrell Gave, MD  loratadine (CLARITIN) 10 MG tablet Take 10 mg by mouth daily. 09/13/18 09/13/19  [provider]  metoprolol succinate (TOPROL-XL) 25 MG 24 hr tablet Take 25 mg by mouth daily. 12/08/18 09/04/19  [provider]  nitroGLYCERIN (NITROSTAT) 0.4 MG SL tablet Place 0.4 mg under the tongue every 5 (five) minutes as needed for chest pain.  03/24/18 06/17/19  [provider]  NOVOLIN 70/30 RELION (70-30) 100 UNIT/ML injection Inject 80-85 Units into the  skin See admin instructions. Inject 85 units into the skin in the morning and 80 units in the evening 05/13/19   [provider]  omeprazole (PRILOSEC) 20 MG capsule Take 20 mg by mouth daily as needed (acid reflux).  04/22/18 07/21/19  [provider]  oxyCODONE-acetaminophen (PERCOCET) 7.5-325 MG tablet Take 1 tablet by mouth every 4 (four) hours as needed for severe pain. 04/23/19   Epifanio Lesches, MD  Potassium Citrate 15 MEQ (1620 MG) TBCR Take 2 tablets by mouth 2 (two) times daily. 07/20/19   Billey Co, MD  tamsulosin (FLOMAX) 0.4 MG CAPS capsule Take 1 capsule (0.4 mg total) by mouth daily after supper. 07/11/19   Billey Co, MD     Allergies Metformin  Family History  Problem Relation Age of Onset  . Heart attack Father 67    Social History Social History   Tobacco Use  . Smoking status: Former Research scientist (life sciences)  . Smokeless tobacco: Never Used  Substance Use Topics  . Alcohol use: Never  . Drug use: Never    Review of Systems  Constitutional: As above Eyes: No visual changes.  ENT: No sore throat. Cardiovascular: Denies chest pain. Respiratory: Denies shortness of breath. Gastrointestinal: No abdominal pain.  No flank pain Genitourinary: Negative for dysuria.  No hematuria Musculoskeletal: Negative for back pain. Skin: Negative for rash. Neurological: Negative for headaches  ____________________________________________   PHYSICAL EXAM:  VITAL SIGNS: ED Triage Vitals  Enc Vitals Group     BP 08/13/19 1047 117/62     Pulse Rate 08/13/19 1047 (!) 103     Resp 08/13/19 1047 17     Temp 08/13/19 1047 98.6 F (37 C)     Temp Source 08/13/19 1047 Oral     SpO2 08/13/19 1047 94 %     Weight 08/13/19 1040 111.1 kg (245 lb)     Height 08/13/19 1040 1.753 m (5\' 9" )     Head Circumference --      Peak Flow --      Pain Score 08/13/19 1040 0     Pain Loc --      Pain Edu? --      Excl. in [place]? --     Constitutional: Alert and  oriented.   Nose: No congestion/rhinnorhea. Mouth/Throat: Mucous membranes are moist.    Cardiovascular: Normal rate, regular rhythm. Grossly normal heart sounds.  Good peripheral circulation. Respiratory: Normal respiratory effort.  No retractions. Lungs CTAB.  No wheezing Gastrointestinal: Soft and nontender. No distention.  No CVA tenderness  Musculoskeletal:  Warm and well perfused Neurologic:  Normal speech and language. No gross focal neurologic deficits are appreciated.  Skin:  Skin is warm, dry and intact. No rash noted. Psychiatric: Mood and affect are normal. Speech and behavior are normal.  ____________________________________________   LABS (all labs ordered are listed, but only abnormal results are displayed)  Labs Reviewed  LACTIC ACID, PLASMA - Abnormal; Notable for the following components:      Result Value   Lactic Acid, Venous 2.1 (*)    All other components within normal limits  COMPREHENSIVE METABOLIC PANEL - Abnormal; Notable for the following components:   Sodium 132 (*)    Glucose, Bld 274 (*)    All other components within normal limits  CBC WITH DIFFERENTIAL/PLATELET - Abnormal; Notable for the following components:   RBC 3.19 (*)    Hemoglobin 11.0 (*)    HCT 33.0 (*)    MCV 103.4 (*)    MCH 34.5 (*)    Platelets 128 (*)    All other components within normal limits  CULTURE, BLOOD (ROUTINE X 2)  CULTURE, BLOOD (ROUTINE X 2)  URINE CULTURE  RESPIRATORY PANEL BY RT PCR (FLU A&B, COVID)  APTT  PROTIME-INR  LACTIC ACID, PLASMA  URINALYSIS, COMPLETE (UACMP) WITH MICROSCOPIC   ____________________________________________  EKG   ____________________________________________  RADIOLOGY  Chest x-ray unremarkable ____________________________________________   PROCEDURES  Procedure(s) performed: No  Procedures   Critical Care performed: No ____________________________________________   INITIAL IMPRESSION / ASSESSMENT AND PLAN / ED  COURSE  Pertinent labs & imaging results that were available during my care of the patient were reviewed by me and considered in my medical decision making (see chart for details).  Patient sent in for positive blood cultures consistent with sepsis secondary to Enterococcus.  Infectious disease recommends ampicillin and gentamicin after labs/cultures as well as ultrasound kidneys.  Lab work significant for elevated lactic acid of 2.1, no hypotension, mild hyponatremia and thrombocytopenia  I discussed with the hospitalist for admission    ____________________________________________   FINAL CLINICAL IMPRESSION(S) / ED DIAGNOSES  Final diagnoses:  Sepsis without acute organ dysfunction, due to unspecified organism [facility])        Note:  This document was prepared using Dragon voice recognition software and may include unintentional dictation errors.   Lavonia Drafts, MD 08/13/19  1207  

## 2019-08-13 NOTE — Progress Notes (Signed)
CODE SEPSIS - PHARMACY COMMUNICATION  **Broad Spectrum Antibiotics should be administered within 1 hour of Sepsis diagnosis**  Time Code Sepsis Called/Page Received: 1046am  Antibiotics Ordered: ampicillin + gentamicin  Time of 1st antibiotic administration: 1138  Additional action taken by pharmacy: none required  If necessary, Name of Provider/Nurse Contacted: N/A    Dallie Piles ,PharmD Clinical Pharmacist  08/13/2019  11:40 AM

## 2019-08-13 NOTE — ED Notes (Addendum)
Pt's son states that he doesn't want "a whole work up because he already has a Engineer, technical sales". Pt's son states that he was told that that the patient would be started on IV antibiotics and admitted and maybe a CT to check for a kidney stone.

## 2019-08-13 NOTE — H&P (Signed)
History and Physical    Jonathan Cross. OE:[id number] DOB: 09-16-1945 DOA: 08/13/2019  Referring MD/NP/PA:   PCP: Georges Mouse, MD   Patient coming from:  The patient is coming from home.  At baseline, pt is independent for most of ADL.        Chief Complaint: Bacteremia  HPI: Jonathan Obara. is a 74 y.o. male with medical history significant of hypertension, hyperlipidemia, diabetes mellitus, GERD, OSA, CAD, CABG, kidney stone, aortic valve replacement, hx of Enterococcus sepsis, who presents with positive blood culture.  Pt has hx of Enterococcus sepsis in the past, which is related to kidney stones. He is being followed by ID, Dr. Delaine Lame. His outpatient blood culture 08/11/19 was positive for enterococcus (4/4), therefor he was sent to ED by Dr. Ramon Dredge for further evaluation and treatment.  Patient states that he had fever and chills in Wednesday, Thursday and Friday, but not today.  He had mild right flank pain which has resolved.  He had nausea and vomited twice yesterday, currently no nausea, vomiting, diarrhea or abdominal pain.  Patient has mild dry cough, no have chest pain. Denies symptoms of UTI or hematuria.  No unilateral weakness.  Patient has generalized weakness.  ED Course: pt was found to have WBC 4.3, pending urinalysis, pending RVP for Covid test, creatinine 1.012, BUN 20 (GFR> 60), temperature normal, blood pressure 117/62, tachycardia, oxygen saturation 94% on room air, RR 17.  Chest x-ray negative. Pending renal US. Patient is admitted to [facility] as inpatient.  Review of Systems:   General: had fevers, chills, no body weight gain, has poor appetite, has fatigue HEENT: no blurry vision, hearing changes or sore throat Respiratory: no dyspnea, has mild coughing, no wheezing CV: no chest pain, no palpitations GI: had nausea, vomiting, no abdominal pain, diarrhea, constipation GU: no dysuria, burning on urination, increased urinary  frequency, hematuria  Ext: no leg edema Neuro: no unilateral weakness, numbness, or tingling, no vision change or hearing loss Skin: no rash, no skin tear. MSK: No muscle spasm, no deformity, no limitation of range of movement in spin Heme: No easy bruising.  Travel history: No recent long distant travel.  Allergy:  Allergies  Allergen Reactions  . Metformin Diarrhea    Loose stools even with XR    Past Medical History:  Diagnosis Date  . Coronary artery disease   . Diabetes ([place])   . H/O aortic valve replacement   . History of kidney stones   . Hx of CABG   . Hyperlipidemia   . Hypertension   . Sleep apnea     Past Surgical History:  Procedure Laterality Date  . AORTIC VALVE REPLACEMENT  2017   [facility], bioprosthetic  . APPENDECTOMY    . CARDIAC VALVE REPLACEMENT    . CORONARY ARTERY BYPASS GRAFT  2017   [facility] and SVG-OM  . CYSTOSCOPY W/ RETROGRADES Right 05/27/2019   Procedure: CYSTOSCOPY WITH RETROGRADE PYELOGRAM;  Surgeon: Billey Co, MD;  Location: [facility];  Service: Urology;  Laterality: Right;  . CYSTOSCOPY W/ RETROGRADES Right 07/11/2019   Procedure: CYSTOSCOPY WITH RETROGRADE PYELOGRAM;  Surgeon: Billey Co, MD;  Location: [facility];  Service: Urology;  Laterality: Right;  . CYSTOSCOPY/URETEROSCOPY/HOLMIUM LASER/STENT PLACEMENT Right 05/27/2019   Procedure: CYSTOSCOPY/URETEROSCOPY/HOLMIUM LASER/STENT PLACEMENT;  Surgeon: Billey Co, MD;  Location: [facility];  Service: Urology;  Laterality: Right;  . CYSTOSCOPY/URETEROSCOPY/HOLMIUM LASER/STENT PLACEMENT Right 06/17/2019   Procedure: CYSTOSCOPY/URETEROSCOPY/HOLMIUM LASER/STENT Exchange;  Surgeon:  Billey Co, MD;  Location: [facility];  Service: Urology;  Laterality: Right;  . CYSTOSCOPY/URETEROSCOPY/HOLMIUM LASER/STENT PLACEMENT Right 07/11/2019   Procedure: CYSTOSCOPY/URETEROSCOPY/HOLMIUM LASER/STENT Exchange;  Surgeon: Billey Co, MD;  Location: [facility];  Service: Urology;   Laterality: Right;  . STONE EXTRACTION WITH BASKET Right 07/11/2019   Procedure: STONE EXTRACTION WITH BASKET;  Surgeon: Billey Co, MD;  Location: [facility];  Service: Urology;  Laterality: Right;  . TEE WITHOUT CARDIOVERSION N/A 04/21/2019   Procedure: TRANSESOPHAGEAL ECHOCARDIOGRAM (TEE);  Surgeon: Minna Merritts, MD;  Location: [facility];  Service: Cardiovascular;  Laterality: N/A;  . TEE WITHOUT CARDIOVERSION N/A 07/04/2019   Procedure: TRANSESOPHAGEAL ECHOCARDIOGRAM (TEE);  Surgeon: Wellington Hampshire, MD;  Location: [facility];  Service: Cardiovascular;  Laterality: N/A;  . TONSILLECTOMY      Social History:  reports that he has quit smoking. He has never used smokeless tobacco. He reports that he does not drink alcohol or use drugs.  Family History:  Family History  Problem Relation Age of Onset  . Heart attack Father 60     Prior to Admission medications   Medication Sig Start Date End Date Taking? Authorizing Provider  amLODipine (NORVASC) 5 MG tablet Take 5 mg by mouth daily. 03/08/19   [provider]  amoxicillin (AMOXIL) 500 MG capsule Take 1 capsule (500 mg total) by mouth 3 (three) times daily. 08/11/19   Tsosie Billing, MD  ampicillin (PRINCIPEN) 500 MG capsule Take 1 capsule (500 mg total) by mouth 4 (four) times daily. 07/14/19   Tsosie Billing, MD  aspirin EC 81 MG tablet Take 81 mg by mouth 3 (three) times a week.  05/30/14   [provider]  atorvastatin (LIPITOR) 80 MG tablet Take 80 mg by mouth daily. 08/13/18   [provider]  glipiZIDE (GLUCOTROL XL) 2.5 MG 24 hr tablet Take 2.5 mg by mouth daily with breakfast.  05/05/19   [provider]  insulin aspart (NOVOLOG) 100 UNIT/ML injection Inject 0-5 Units into the skin at bedtime. 04/23/19   Epifanio Lesches, MD  isosorbide mononitrate (IMDUR) 60 MG 24 hr tablet Take 1 tablet (60 mg total) by mouth daily. 05/12/19 08/10/19  End, Harrell Gave, MD  lisinopril  (ZESTRIL) 20 MG tablet Take 1 tablet (20 mg total) by mouth daily. Please call to schedule appointment for further refills. Thank you! 08/09/19 05/05/20  End, Harrell Gave, MD  loratadine (CLARITIN) 10 MG tablet Take 10 mg by mouth daily. 09/13/18 09/13/19  [provider]  metoprolol succinate (TOPROL-XL) 25 MG 24 hr tablet Take 25 mg by mouth daily. 12/08/18 09/04/19  [provider]  nitroGLYCERIN (NITROSTAT) 0.4 MG SL tablet Place 0.4 mg under the tongue every 5 (five) minutes as needed for chest pain.  03/24/18 06/17/19  [provider]  NOVOLIN 70/30 RELION (70-30) 100 UNIT/ML injection Inject 80-85 Units into the skin See admin instructions. Inject 85 units into the skin in the morning and 80 units in the evening 05/13/19   [provider]  omeprazole (PRILOSEC) 20 MG capsule Take 20 mg by mouth daily as needed (acid reflux).  04/22/18 07/21/19  [provider]  oxyCODONE-acetaminophen (PERCOCET) 7.5-325 MG tablet Take 1 tablet by mouth every 4 (four) hours as needed for severe pain. 04/23/19   Epifanio Lesches, MD  Potassium Citrate 15 MEQ (1620 MG) TBCR Take 2 tablets by mouth 2 (two) times daily. 07/20/19   Billey Co, MD  tamsulosin (FLOMAX) 0.4 MG CAPS capsule Take 1  capsule (0.4 mg total) by mouth daily after supper. 07/11/19   Billey Co, MD    Physical Exam: Vitals:   08/13/19 1040 08/13/19 1047 08/13/19 1135 08/13/19 1208  BP:  117/62 (!) 115/59   Pulse:  (!) 103 97 94  Resp:  17 (!) 25 (!) 21  Temp:  98.6 F (37 C)    TempSrc:  Oral    SpO2:  94% 94% 92%  Weight: 111.1 kg     Height: 5\' 9"  (1.753 m)      General: Not in acute distress HEENT:       Eyes: PERRL, EOMI, no scleral icterus.       ENT: No discharge from the ears and nose, no pharynx injection, no tonsillar enlargement.        Neck: No JVD, no bruit, no mass felt. Heme: No neck lymph node enlargement. Cardiac: S1/S2, RRR, No murmurs, No gallops or  rubs. Respiratory:  No rales, wheezing, rhonchi or rubs. GI: Soft, nondistended, nontender, no rebound pain, no organomegaly, BS present. GU: No hematuria Ext: No pitting leg edema bilaterally. 2+DP/PT pulse bilaterally. Musculoskeletal: No joint deformities, No joint redness or warmth, no limitation of ROM in spin. Skin: No rashes.  Neuro: Alert, oriented X3, cranial nerves II-XII grossly intact, moves all extremities normally. Psych: Patient is not psychotic, no suicidal or hemocidal ideation.  Labs on Admission: I have personally reviewed following labs and imaging studies  CBC: Recent Labs  Lab 08/11/19 1414 08/13/19 1100  WBC 4.6 4.3  NEUTROABS 2.1 2.1  HGB 11.6* 11.0*  HCT 34.9* 33.0*  MCV 104.8* 103.4*  PLT 129* 0000000*   Basic Metabolic Panel: Recent Labs  Lab 08/11/19 1414 08/13/19 1100  NA 133* 132*  K 4.7 4.4  CL 101 101  CO2 24 22  GLUCOSE 159* 274*  BUN 17 20  CREATININE 1.08 1.12  CALCIUM 9.5 9.0   GFR: Estimated Creatinine Clearance: 70 mL/min (by C-G formula based on SCr of 1.12 mg/dL). Liver Function Tests: Recent Labs  Lab 08/11/19 1414 08/13/19 1100  AST 33 28  ALT 40 33  ALKPHOS 48 58  BILITOT 0.8 1.0  PROT 7.5 7.4  ALBUMIN 3.9 3.6   No results for input(s): LIPASE, AMYLASE in the last 168 hours. No results for input(s): AMMONIA in the last 168 hours. Coagulation Profile: Recent Labs  Lab 08/13/19 1100  INR 1.0   Cardiac Enzymes: No results for input(s): CKTOTAL, CKMB, CKMBINDEX, TROPONINI in the last 168 hours. BNP (last 3 results) No results for input(s): PROBNP in the last 8760 hours. HbA1C: No results for input(s): HGBA1C in the last 72 hours. CBG: No results for input(s): GLUCAP in the last 168 hours. Lipid Profile: No results for input(s): CHOL, HDL, LDLCALC, TRIG, CHOLHDL, LDLDIRECT in the last 72 hours. Thyroid Function Tests: No results for input(s): TSH, T4TOTAL, FREET4, T3FREE, THYROIDAB in the last 72 hours. Anemia  Panel: No results for input(s): VITAMINB12, FOLATE, FERRITIN, TIBC, IRON, RETICCTPCT in the last 72 hours. Urine analysis:    Component Value Date/Time   COLORURINE STRAW (A) 06/28/2019 2327   APPEARANCEUR CLEAR (A) 06/28/2019 2327   APPEARANCEUR Cloudy (A) 06/07/2019 1409   LABSPEC 1.033 (H) 06/28/2019 2327   PHURINE 6.0 06/28/2019 2327   GLUCOSEU NEGATIVE 06/28/2019 2327   HGBUR MODERATE (A) 06/28/2019 2327   BILIRUBINUR NEGATIVE 06/28/2019 2327   BILIRUBINUR Negative 06/07/2019 1409   KETONESUR NEGATIVE 06/28/2019 2327   PROTEINUR NEGATIVE 06/28/2019 2327   NITRITE  NEGATIVE 06/28/2019 2327   LEUKOCYTESUR TRACE (A) 06/28/2019 2327   Sepsis Labs: @LABRCNTIP (procalcitonin:4,lacticidven:4) ) Recent Results (from the past 240 hour(s))  Urine Culture     Status: None   Collection Time: 08/11/19  2:14 PM   Specimen: Urine, Random  Result Value Ref Range Status   Specimen Description   Final    URINE, RANDOM Performed at [facility], [address], [place], [place] [address]    Special Requests   Final    NONE Performed at [facility], [address], [place], [place] [address]    Culture   Final    NO GROWTH Performed at [facility], [place] [address], [place], [place] [address]    Report Status 08/12/2019 FINAL  Final  Blood culture (routine single)     Status: None (Preliminary result)   Collection Time: 08/11/19  2:14 PM   Specimen: BLOOD  Result Value Ref Range Status   Specimen Description BLOOD LEFT HAND  Final   Special Requests   Final    BOTTLES DRAWN AEROBIC AND ANAEROBIC Blood Culture adequate volume   Culture  Setup Time   Final    GRAM POSITIVE COCCI IN BOTH AEROBIC AND ANAEROBIC BOTTLES CRITICAL RESULT CALLED TO, READ BACK BY AND VERIFIED WITH: DR. Tsosie Billing AT 0820 08/13/19.PMF Performed at [facility], [address] [place], [place] [address]    Culture [facility] POSITIVE COCCI  Final   Report Status  PENDING  Incomplete  Blood culture (routine single)     Status: None (Preliminary result)   Collection Time: 08/11/19  2:14 PM   Specimen: BLOOD  Result Value Ref Range Status   Specimen Description BLOOD RIGHT HAND  Final   Special Requests   Final    BOTTLES DRAWN AEROBIC AND ANAEROBIC Blood Culture adequate volume   Culture  Setup Time   Final    Organism ID to follow IN BOTH AEROBIC AND ANAEROBIC BOTTLES GRAM POSITIVE COCCI CRITICAL RESULT CALLED TO, READ BACK BY AND VERIFIED WITH: DR. Tsosie Billing AT 0820 08/13/19.PMF Performed at [facility], [address] [place],  [address]    Culture [facility] POSITIVE COCCI  Final   Report Status PENDING  Incomplete  Blood Culture ID Panel (Reflexed)     Status: Abnormal   Collection Time: 08/11/19  2:14 PM  Result Value Ref Range Status   Enterococcus species DETECTED (A) NOT DETECTED Final    Comment: CRITICAL RESULT CALLED TO, READ BACK BY AND VERIFIED WITH: DR. Tsosie Billing AT 0820 08/13/19.PMF    Vancomycin resistance NOT DETECTED NOT DETECTED Final   Listeria monocytogenes NOT DETECTED NOT DETECTED Final   Staphylococcus species NOT DETECTED NOT DETECTED Final   Staphylococcus aureus (BCID) NOT DETECTED NOT DETECTED Final   Streptococcus species NOT DETECTED NOT DETECTED Final   Streptococcus agalactiae NOT DETECTED NOT DETECTED Final   Streptococcus pneumoniae NOT DETECTED NOT DETECTED Final   Streptococcus pyogenes NOT DETECTED NOT DETECTED Final   Acinetobacter baumannii NOT DETECTED NOT DETECTED Final   Enterobacteriaceae species NOT DETECTED NOT DETECTED Final   Enterobacter cloacae complex NOT DETECTED NOT DETECTED Final   Escherichia coli NOT DETECTED NOT DETECTED Final   Klebsiella oxytoca NOT DETECTED NOT DETECTED Final   Klebsiella pneumoniae NOT DETECTED NOT DETECTED Final   Proteus species NOT DETECTED NOT DETECTED Final   Serratia marcescens NOT DETECTED NOT DETECTED Final    Haemophilus influenzae NOT DETECTED NOT DETECTED Final  Neisseria meningitidis NOT DETECTED NOT DETECTED Final   Pseudomonas aeruginosa NOT DETECTED NOT DETECTED Final   Candida albicans NOT DETECTED NOT DETECTED Final   Candida glabrata NOT DETECTED NOT DETECTED Final   Candida krusei NOT DETECTED NOT DETECTED Final   Candida parapsilosis NOT DETECTED NOT DETECTED Final   Candida tropicalis NOT DETECTED NOT DETECTED Final    Comment: Performed at [facility], [address] [place], [place] [address]  Respiratory Panel by RT PCR (Flu A&B, Covid) - Nasopharyngeal Swab     Status: None   Collection Time: 08/13/19 11:00 AM   Specimen: Nasopharyngeal Swab  Result Value Ref Range Status   SARS Coronavirus 2 by RT PCR NEGATIVE NEGATIVE Final    Comment: (NOTE) SARS-CoV-2 target nucleic acids are NOT DETECTED. The SARS-CoV-2 RNA is generally detectable in upper respiratoy specimens during the acute phase of infection. The lowest concentration of SARS-CoV-2 viral copies this assay can detect is 131 copies/mL. A negative result does not preclude SARS-Cov-2 infection and should not be used as the sole basis for treatment or other patient management decisions. A negative result may occur with  improper specimen collection/handling, submission of specimen other than nasopharyngeal swab, presence of viral mutation(s) within the areas targeted by this assay, and inadequate number of viral copies (<131 copies/mL). A negative result must be combined with clinical observations, patient history, and epidemiological information. The expected result is Negative. Fact Sheet for Patients:  PinkCheek.be Fact Sheet for Healthcare Providers:  GravelBags.it This test is not yet ap proved or cleared by the [place] FDA and  has been authorized for detection and/or diagnosis of SARS-CoV-2 by FDA under an Emergency Use Authorization  (EUA). This EUA will remain  in effect (meaning this test can be used) for the duration of the COVID-19 declaration under Section 564(b)(1) of the Act, 21 U.S.C. section 360bbb-3(b)(1), unless the authorization is terminated or revoked sooner.    Influenza A by PCR NEGATIVE NEGATIVE Final   Influenza B by PCR NEGATIVE NEGATIVE Final    Comment: (NOTE) The Xpert Xpress SARS-CoV-2/FLU/RSV assay is intended as an aid in  the diagnosis of influenza from Nasopharyngeal swab specimens and  should not be used as a sole basis for treatment. Nasal washings and  aspirates are unacceptable for Xpert Xpress SARS-CoV-2/FLU/RSV  testing. Fact Sheet for Patients: PinkCheek.be Fact Sheet for Healthcare Providers: GravelBags.it This test is not yet approved or cleared by the [place] FDA and  has been authorized for detection and/or diagnosis of SARS-CoV-2 by  FDA under an Emergency Use Authorization (EUA). This EUA will remain  in effect (meaning this test can be used) for the duration of the  Covid-19 declaration under Section 564(b)(1) of the Act, 21  U.S.C. section 360bbb-3(b)(1), unless the authorization is  terminated or revoked. Performed at [facility], [address], [place], [place] [address]      Radiological Exams on Admission: US Renal  Result Date: 08/13/2019 CLINICAL DATA:  Evaluate for hydronephrosis. History of right kidney stones. EXAM: RENAL / URINARY TRACT ULTRASOUND COMPLETE COMPARISON:  CT scan June 28, 2019 FINDINGS: Right Kidney: Renal measurements: 12.8 x 5.9 x 5.2 cm = volume: 206 mL. Contains a 1.8 cm cyst. Mild prominence of the right renal pelvis without caliectasis. Left Kidney: Renal measurements: 14.2 x 6.2 x 5.8 cm = volume: 269 mL. Contains a 5 cm cyst. No hydronephrosis. Bladder: Appears normal for degree of bladder distention. Other: None. IMPRESSION: 1. Mild prominence of  the right renal  pelvis without caliectasis. No gross hydronephrosis. 2. Bilateral renal cysts. Electronically Signed   By: Dorise Bullion III M.D   On: 08/13/2019 12:51   DG Chest Port 1 View  Result Date: 08/13/2019 CLINICAL DATA:  Sepsis.  Fever.  Cough. EXAM: PORTABLE CHEST 1 VIEW COMPARISON:  06/26/2019 FINDINGS: Prior median sternotomy. Midline trachea. Borderline cardiomegaly. Atherosclerosis in the transverse aorta. No pleural effusion or pneumothorax. Clear lungs. IMPRESSION: No acute cardiopulmonary disease. Aortic Atherosclerosis (ICD10-I70.0). Electronically Signed   By: Abigail Miyamoto M.D.   On: 08/13/2019 11:33     EKG on 08/12/2019: Independently reviewed.  Sinus rhythm, QTC 453, LAE, LAD.  Assessment/Plan Principal Problem:   Bacteremia due to Enterococcus Active Problems:   Essential hypertension   Type 2 diabetes mellitus without complication, with long-term current use of insulin (HCC)   Hyperlipidemia LDL goal <70   GERD (gastroesophageal reflux disease)   Coronary artery disease   Bacteremia due to Enterococcus: Per Dr. Gwenevere Ghazi note, "Pt has h/o enterococcus bacteremia X3  secondary to infected renal stone and has undergone multiple procedures and stents . Currently he has no stent. He has a prosthetic Aortic valve and TEE done twice before was negative". His blood cultures sent on 08/11/19 as OP was positive for enterococcus (4/4). Dr. Steva Ready recommended to put pt on " IV Amp+ gent combo ( rather than AMP+ ceftriaxone as previously discussed if creatinine okay) . Creatinine and levels to be monitored closely.He will need 6 weeks of Iv antibiotics (and gent will be switched to ceftriaxone later)". PICC line cannot be placed until he clears the bacteremia per Dr. Delaine Lame.  -will admit to med-surg bed as inpt -Highly appreciated Dr. Danielle Rankin recommendation -IV ampicillin and gentamicin started in ED -f/u Bx and Ux -Follow-up Ultrasound of kidney to look for any hydro. If  positive, will need to urology consult -IVF: 2L NS, then 75 cc/h  HTN:  -Continue home medications: Amlodipine, metoprolol -Hold lisinopril since patient is at risk of developing sepsis and hypotension -hydralazine prn  Type 2 diabetes mellitus without complication, with long-term current use of insulin ([place]): Most recent A1c 8.0, poorly controled. Patient is taking glipizide, NovoLog, 70/30 insulin at home -will decrease 70/30 insulin dose from 90 to 60 units bid -SSI  Hyperlipidemia LDL goal <70 -lipitor  GERD (gastroesophageal reflux disease): -protonix  Coronary artery disease: s/p of CABG. No CP -on ASA, lipitor, Imdur and prn NGT   Inpatient status:  # Patient requires inpatient status due to high intensity of service, high risk for further deterioration and high frequency of surveillance required.  I certify that at the point of admission it is my clinical judgment that the patient will require inpatient hospital care spanning beyond 2 midnights from the point of admission.  . This patient has multiple chronic comorbidities including hypertension, hyperlipidemia, diabetes mellitus, GERD, OSA, CAD, CABG, kidney stone, aortic valve replacement, hx of Enterococcus sepsis . Now patient has presenting with Enterococcus bacteremia . The initial radiographic and laboratory data are worrisome because of positive blood culture for Enterococcus . Current medical needs: please see my assessment and plan . Predictability of an adverse outcome (risk): Patient has multiple comorbidities as listed above. Now presents with Enterococcus bacteremia. Pt is at high risk for developing sepsis and endocarditis. Patient's presentation is highly complicated.  Patient is at high risk of deteriorating.  Will need to be treated in hospital for at least 2 days.  DVT ppx: SQ Heparin   Code Status: Full code Family Communication: None at bed side.    Disposition Plan:  Anticipate  discharge back to previous home environment Consults called:  none Admission status: Med-surg bed as inpt      Date of Service 08/13/2019    [facility]   If 7PM-7AM, please contact night-coverage [email or web address] Password [facility] 08/13/2019, 12:57 PM

## 2019-08-13 NOTE — Plan of Care (Signed)
Continue with plan of care.  

## 2019-08-14 LAB — BASIC METABOLIC PANEL
Anion gap: 9 (ref 5–15)
BUN: 13 mg/dL (ref 8–23)
CO2: 23 mmol/L (ref 22–32)
Calcium: 8.7 mg/dL — ABNORMAL LOW (ref 8.9–10.3)
Chloride: 106 mmol/L (ref 98–111)
Creatinine, Ser: 0.74 mg/dL (ref 0.61–1.24)
GFR calc Af Amer: >60 mL/min (ref >60)
GFR calc non Af Amer: >60 mL/min (ref >60)
Glucose, Bld: 162 mg/dL — ABNORMAL HIGH (ref 70–99)
Potassium: 4.6 mmol/L (ref 3.5–5.1)
Sodium: 138 mmol/L (ref 135–145)

## 2019-08-14 LAB — CBC
HCT: 30.4 % — ABNORMAL LOW (ref 39.0–52.0)
Hemoglobin: 10.3 g/dL — ABNORMAL LOW (ref 13.0–17.0)
MCH: 35.2 pg — ABNORMAL HIGH (ref 26.0–34.0)
MCHC: 33.9 g/dL (ref 30.0–36.0)
MCV: 103.8 fL — ABNORMAL HIGH (ref 80.0–100.0)
Platelets: 123 10*3/uL — ABNORMAL LOW (ref 150–400)
RBC: 2.93 MIL/uL — ABNORMAL LOW (ref 4.22–5.81)
RDW: 15.3 % (ref 11.5–15.5)
WBC: 4.3 10*3/uL (ref 4.0–10.5)
nRBC: 0 % (ref 0.0–0.2)

## 2019-08-14 LAB — GLUCOSE, CAPILLARY
Glucose-Capillary: 136 mg/dL — ABNORMAL HIGH (ref 70–99)
Glucose-Capillary: 172 mg/dL — ABNORMAL HIGH (ref 70–99)
Glucose-Capillary: 165 mg/dL — ABNORMAL HIGH (ref 70–99)
Glucose-Capillary: 168 mg/dL — ABNORMAL HIGH (ref 70–99)
Glucose-Capillary: 129 mg/dL — ABNORMAL HIGH (ref 70–99)

## 2019-08-14 LAB — GENTAMICIN LEVEL, PEAK: Gentamicin Pk: 8.5 ug/mL (ref 5.0–10.0)

## 2019-08-14 LAB — GENTAMICIN LEVEL, TROUGH: Gentamicin Trough: 1.4 ug/mL (ref 0.5–2.0)

## 2019-08-14 MED ORDER — GENTAMICIN SULFATE 40 MG/ML IJ SOLN
50.0000 mg | Freq: Three times a day (TID) | INTRAVENOUS | Status: DC
  Administered 2019-08-15 (×2): 50 mg via INTRAVENOUS
  Filled 2019-08-14 (×4): qty 1.25

## 2019-08-14 NOTE — Consult Note (Signed)
Pharmacy Antibiotic Note  Jonathan Cross. is a 74 y.o. male admitted on 08/13/2019 with bacteremia.  Pharmacy has been consulted for ampicillin and gentamicin dosing. Blood cultures from 1/28 have been reported 4/4 Enterococcus with a h/o enterococcal bacteremia. Based on ID recommendations we started ampicillin + gentamicin for synergy  gentamicin steady-state levels: 1/31 0536 8 mcg/mL 1/31 1128 1.4 mcg/mL  Calculated Pk Parameters  T1/2: 2.33h Ke: 0.297 h-1  Plan: 1) continue ampicillin 2 grams IV every 4 hours  2) change gentamicin dose to 50 mg IV every 8 hours  Next dose 12 hours after previous dose   Follow renal function closely: SCr in am  Gentamicin peak and trough following the 3rd dose  Predicted New Css: 3.8/0.4 mcg/mL  Goal gentamicin levels:   Peak: 3 - 5 mg/L  Trough: <1 mg/L  Height: 5\' 9"  (175.3 cm) Weight: 245 lb (111.1 kg) IBW/kg (Calculated) : 70.7  Temp (24hrs), Avg:98.2 F (36.8 C), Min:97.6 F (36.4 C), Max:98.6 F (37 C)  Recent Labs  Lab 08/11/19 1414 08/13/19 1100 08/14/19 0536  WBC 4.6 4.3 4.3  CREATININE 1.08 1.12 0.74  LATICACIDVEN  --  1.7  2.1*  --   GENTPEAK  --   --  8.5    Estimated Creatinine Clearance: 98.1 mL/min (by C-G formula based on SCr of 0.74 mg/dL).    Antimicrobials this admission: 1/30 ampicillin >>  1/30 gentamicin >>   Microbiology results: 1/28 BCx: Enterococcus sp 4/4 (sensitivities pending) 1/28 UCx: pending   Thank you for allowing pharmacy to be a part of this patient's care.  Dallie Piles 08/14/2019 10:16 AM

## 2019-08-14 NOTE — Progress Notes (Signed)
Subjective: No fever since admission.  Doing okay so far.  Objective: Vital signs in last 24 hours: Temp:  [97.6 F (36.4 C)-98.4 F (36.9 C)] 97.6 F (36.4 C) (01/31 0440) Pulse Rate:  [88-97] 97 (01/31 0440) Resp:  [18-20] 18 (01/31 0440) BP: (121-128)/(69-77) 128/69 (01/31 0834) SpO2:  [95 %-98 %] 97 % (01/31 0440)  Intake/Output from previous day: 01/30 0701 - 01/31 0700 In: 2523.9 [I.V.:872.4] Out: 1400 [Urine:1400] Intake/Output this shift: Total I/O In: 399.3 [I.V.:299.3; IV Piggyback:100] Out: 425 [Urine:425]  General: Not in acute distress HEENT:       Eyes: PERRL, EOMI, no scleral icterus.       ENT: No discharge from the ears and nose, no pharynx injection, no tonsillar enlargement.        Neck: No JVD, no bruit, no mass felt. Heme: No neck lymph node enlargement. Cardiac: S1/S2, RRR, No murmurs, No gallops or rubs. Respiratory:  No rales, wheezing, rhonchi or rubs. GI: Soft, nondistended, nontender, no rebound pain, no organomegaly, BS present. GU: No hematuria Ext: No pitting leg edema bilaterally. 2+DP/PT pulse bilaterally. Musculoskeletal: No joint deformities, No joint redness or warmth, no limitation of ROM in spin. Skin: No rashes.  Neuro: Alert, oriented X3, cranial nerves II-XII grossly intact, moves all extremities normally. Psych: Patient is not psychotic, no suicidal or hemocidal ideation.  Results for orders placed or performed during the hospital encounter of 08/13/19 (from the past 24 hour(s))  Glucose, capillary     Status: Abnormal   Collection Time: 08/13/19  3:45 PM  Result Value Ref Range   Glucose-Capillary 173 (H) 70 - 99 mg/dL  Glucose, capillary     Status: Abnormal   Collection Time: 08/13/19  9:36 PM  Result Value Ref Range   Glucose-Capillary 166 (H) 70 - 99 mg/dL  Glucose, capillary     Status: Abnormal   Collection Time: 08/13/19 11:55 PM  Result Value Ref Range   Glucose-Capillary 155 (H) 70 - 99 mg/dL  Glucose, capillary      Status: Abnormal   Collection Time: 08/14/19  4:41 AM  Result Value Ref Range   Glucose-Capillary 136 (H) 70 - 99 mg/dL  Basic metabolic panel     Status: Abnormal   Collection Time: 08/14/19  5:36 AM  Result Value Ref Range   Sodium 138 135 - 145 mmol/L   Potassium 4.6 3.5 - 5.1 mmol/L   Chloride 106 98 - 111 mmol/L   CO2 23 22 - 32 mmol/L   Glucose, Bld 162 (H) 70 - 99 mg/dL   BUN 13 8 - 23 mg/dL   Creatinine, Ser 0.74 0.61 - 1.24 mg/dL   Calcium 8.7 (L) 8.9 - 10.3 mg/dL   GFR calc non Af Amer >60 >60 mL/min   GFR calc Af Amer >60 >60 mL/min   Anion gap 9 5 - 15  CBC     Status: Abnormal   Collection Time: 08/14/19  5:36 AM  Result Value Ref Range   WBC 4.3 4.0 - 10.5 K/uL   RBC 2.93 (L) 4.22 - 5.81 MIL/uL   Hemoglobin 10.3 (L) 13.0 - 17.0 g/dL   HCT 30.4 (L) 39.0 - 52.0 %   MCV 103.8 (H) 80.0 - 100.0 fL   MCH 35.2 (H) 26.0 - 34.0 pg   MCHC 33.9 30.0 - 36.0 g/dL   RDW 15.3 11.5 - 15.5 %   Platelets 123 (L) 150 - 400 K/uL   nRBC 0.0 0.0 - 0.2 %  Gentamicin  level, peak     Status: None   Collection Time: 08/14/19  5:36 AM  Result Value Ref Range   Gentamicin Pk 8.5 5.0 - 10.0 ug/mL  Glucose, capillary     Status: Abnormal   Collection Time: 08/14/19  8:02 AM  Result Value Ref Range   Glucose-Capillary 172 (H) 70 - 99 mg/dL   Comment 1 Notify RN   Gentamicin level, trough     Status: None   Collection Time: 08/14/19 11:28 AM  Result Value Ref Range   Gentamicin Trough 1.4 0.5 - 2.0 ug/mL  Glucose, capillary     Status: Abnormal   Collection Time: 08/14/19 12:16 PM  Result Value Ref Range   Glucose-Capillary 165 (H) 70 - 99 mg/dL   Comment 1 Notify RN     Studies/Results: US Renal  Result Date: 08/13/2019 CLINICAL DATA:  Evaluate for hydronephrosis. History of right kidney stones. EXAM: RENAL / URINARY TRACT ULTRASOUND COMPLETE COMPARISON:  CT scan June 28, 2019 FINDINGS: Right Kidney: Renal measurements: 12.8 x 5.9 x 5.2 cm = volume: 206 mL. Contains a 1.8 cm  cyst. Mild prominence of the right renal pelvis without caliectasis. Left Kidney: Renal measurements: 14.2 x 6.2 x 5.8 cm = volume: 269 mL. Contains a 5 cm cyst. No hydronephrosis. Bladder: Appears normal for degree of bladder distention. Other: None. IMPRESSION: 1. Mild prominence of the right renal pelvis without caliectasis. No gross hydronephrosis. 2. Bilateral renal cysts. Electronically Signed   By: Dorise Bullion III M.D   On: 08/13/2019 12:51   DG Chest Port 1 View  Result Date: 08/13/2019 CLINICAL DATA:  Sepsis.  Fever.  Cough. EXAM: PORTABLE CHEST 1 VIEW COMPARISON:  06/26/2019 FINDINGS: Prior median sternotomy. Midline trachea. Borderline cardiomegaly. Atherosclerosis in the transverse aorta. No pleural effusion or pneumothorax. Clear lungs. IMPRESSION: No acute cardiopulmonary disease. Aortic Atherosclerosis (ICD10-I70.0). Electronically Signed   By: Abigail Miyamoto M.D.   On: 08/13/2019 11:33    Scheduled Meds: . amLODipine  5 mg Oral Daily  . [START ON 08/15/2019] aspirin EC  81 mg Oral Once per day on Mon Wed Fri  . atorvastatin  80 mg Oral q1800  . heparin  5,000 Units Subcutaneous Q8H  . insulin aspart  0-9 Units Subcutaneous Q4H  . insulin aspart protamine- aspart  60 Units Subcutaneous BID WC  . isosorbide mononitrate  60 mg Oral Daily  . loratadine  10 mg Oral Daily  . metoprolol succinate  25 mg Oral Daily  . pantoprazole  40 mg Oral Daily   Continuous Infusions: . sodium chloride 75 mL/hr at 08/14/19 1000  . ampicillin (OMNIPEN) IV 2 g (08/14/19 1236)  . [START ON 08/15/2019] gentamicin     PRN Meds:acetaminophen, guaiFENesin-dextromethorphan, hydrALAZINE, nitroGLYCERIN, ondansetron (ZOFRAN) IV, senna-docusate  Assessment/Plan:  Assessment/Plan Principal Problem:   Bacteremia due to Enterococcus Active Problems:   Essential hypertension   Type 2 diabetes mellitus without complication, with long-term current use of insulin (HCC)   Hyperlipidemia LDL goal <70   GERD  (gastroesophageal reflux disease)   Coronary artery disease   Bacteremia due to Enterococcus: Continue antibiotic treatment as per ID recommendation  - Per Dr. 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 Currently he has no stent. 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 His blood cultures sent on 08/11/19 as OP was positive for enterococcus (4/4). Dr. Steva Ready recommended to put pt on "  IV Amp+ gent combo ( rather than AMP+ ceftriaxone as previously discussedif creatinine okay) .  -Continue to monitor creatinine   - He will need 6 weeks of Iv antibiotics (and gent will be switched to ceftriaxone later)" - PICC line cannot be placed until he clears the bacteremia per Dr. Delaine Lame. -Blood culture ordered on admission on 08/13/2019 prelim shows no growth so far -IV ampicillin and gentamicin   -f/u Bx and Ux negative so far -Follow-up Ultrasound -- Negative for any hydro or acute issues   -IVF continued  HTN: Stable -Continue home medications: Amlodipine, metoprolol -Continue to hold lisinopril -hydralazine prn  Type 2 diabetes mellitus without complication, with long-term current use of insulin ([place]):  Stable  - most recent A1c 8.0, poorly controled. Patient is taking glipizide, NovoLog, 70/30 insulin at home -will decrease 70/30 insulin dose from 90 to 60 units bid -SSI  Hyperlipidemia LDL goal <70 -lipitor  GERD (gastroesophageal reflux disease): -protonix  Coronary artery disease: s/p of CABG. No CP -on ASA, lipitor, Imdur and prn NGT  DVT prophylaxis    LOS: 1 day   Jonathan Cross Izetta Dakin

## 2019-08-14 NOTE — Plan of Care (Signed)
Continuing with plan of care. 

## 2019-08-15 ENCOUNTER — Inpatient Hospital Stay
Admit: 2019-08-15 | Discharge: 2019-08-15 | Disposition: A | Discharge status: Home / Self Care | Payer: Medicare HMO | Attending: Infectious Diseases | Admitting: Infectious Diseases

## 2019-08-15 DIAGNOSIS — R7881 Bacteremia: Secondary | ICD-10-CM

## 2019-08-15 DIAGNOSIS — B952 Enterococcus as the cause of diseases classified elsewhere: Secondary | ICD-10-CM

## 2019-08-15 DIAGNOSIS — Z95820 Peripheral vascular angioplasty status with implants and grafts: Secondary | ICD-10-CM

## 2019-08-15 DIAGNOSIS — Z952 Presence of prosthetic heart valve: Secondary | ICD-10-CM

## 2019-08-15 DIAGNOSIS — Z888 Allergy status to other drugs, medicaments and biological substances status: Secondary | ICD-10-CM

## 2019-08-15 DIAGNOSIS — Z87442 Personal history of urinary calculi: Secondary | ICD-10-CM

## 2019-08-15 DIAGNOSIS — Z87440 Personal history of urinary (tract) infections: Secondary | ICD-10-CM

## 2019-08-15 DIAGNOSIS — Z951 Presence of aortocoronary bypass graft: Secondary | ICD-10-CM

## 2019-08-15 DIAGNOSIS — Z79899 Other long term (current) drug therapy: Secondary | ICD-10-CM

## 2019-08-15 DIAGNOSIS — I10 Essential (primary) hypertension: Secondary | ICD-10-CM

## 2019-08-15 LAB — GLUCOSE, CAPILLARY
Glucose-Capillary: 133 mg/dL — ABNORMAL HIGH (ref 70–99)
Glucose-Capillary: 187 mg/dL — ABNORMAL HIGH (ref 70–99)
Glucose-Capillary: 213 mg/dL — ABNORMAL HIGH (ref 70–99)
Glucose-Capillary: 238 mg/dL — ABNORMAL HIGH (ref 70–99)
Glucose-Capillary: 79 mg/dL (ref 70–99)

## 2019-08-15 LAB — CULTURE, BLOOD (SINGLE)
Special Requests: ADEQUATE
Special Requests: ADEQUATE

## 2019-08-15 LAB — URINE CULTURE: Culture: NO GROWTH

## 2019-08-15 LAB — CREATININE, SERUM
Creatinine, Ser: 0.78 mg/dL (ref 0.61–1.24)
GFR calc Af Amer: >60 mL/min (ref >60)
GFR calc non Af Amer: >60 mL/min (ref >60)

## 2019-08-15 MED ORDER — PSYLLIUM 95 % PO PACK
1.0000 | PACK | Freq: Every day | ORAL | Status: DC
  Administered 2019-08-16: 1 via ORAL
  Filled 2019-08-15 (×4): qty 1

## 2019-08-15 MED ORDER — INSULIN ASPART 100 UNIT/ML ~~LOC~~ SOLN
0.0000 [IU] | Freq: Three times a day (TID) | SUBCUTANEOUS | Status: DC
  Administered 2019-08-15: 1 [IU] via SUBCUTANEOUS
  Administered 2019-08-15: 3 [IU] via SUBCUTANEOUS
  Administered 2019-08-15: 2 [IU] via SUBCUTANEOUS
  Administered 2019-08-16 (×2): 3 [IU] via SUBCUTANEOUS
  Administered 2019-08-16: 2 [IU] via SUBCUTANEOUS
  Administered 2019-08-17: 1 [IU] via SUBCUTANEOUS
  Administered 2019-08-17 – 2019-08-18 (×3): 2 [IU] via SUBCUTANEOUS
  Administered 2019-08-18: 3 [IU] via SUBCUTANEOUS
  Administered 2019-08-18: 1 [IU] via SUBCUTANEOUS
  Filled 2019-08-15 (×12): qty 1

## 2019-08-15 MED ORDER — GENTAMICIN IN SALINE 1.6-0.9 MG/ML-% IV SOLN
80.0000 mg | Freq: Two times a day (BID) | INTRAVENOUS | Status: DC
  Administered 2019-08-15 – 2019-08-18 (×6): 80 mg via INTRAVENOUS
  Filled 2019-08-15 (×8): qty 50

## 2019-08-15 MED ORDER — MAGNESIUM HYDROXIDE 400 MG/5ML PO SUSP: 30.0000 mL | Freq: Every day | ORAL | Status: DC | PRN

## 2019-08-15 MED ORDER — SODIUM CHLORIDE 0.9 % IV SOLN
INTRAVENOUS | Status: DC | PRN
  Administered 2019-08-15: 250 mL via INTRAVENOUS

## 2019-08-15 MED ORDER — INSULIN ASPART 100 UNIT/ML ~~LOC~~ SOLN
0.0000 [IU] | Freq: Every day | SUBCUTANEOUS | Status: DC
  Administered 2019-08-15: 2 [IU] via SUBCUTANEOUS
  Filled 2019-08-15: qty 1

## 2019-08-15 NOTE — Consult Note (Signed)
NAME: Jonathan Cross.  DOB: July 22, 1945  MRN: [id number]  Date/Time: 08/15/2019 11:26 AM  REQUESTING PROVIDER: alam Subjective:  REASON FOR CONSULT: enterococcus bacteremia ? Jonathan Cross. is a 74 y.o. male with a history of Enterococcus bacteremia, renal stome, s/p mulitple procedures , stents, CABG, Aortic valve replacement admitted to the hospital for fever and enterococcus bacteremia   Patient has a complicated urological history.  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  There was mild right hydronephrosis.  There was no hydroureter..  During that hospitalization he was on IV ampicillin and a repeat blood culture on 04/20/2019 was negative for Enterococcus.  Had a TEE to rule out prosthetic valve endocarditis which was negative.  He was seen by urologist and was asked to see them as outpatient.  He was sent home on IV ampicillin to complete 14 days of treatment and also to see the urologist prior to the completion of IV antibiotic.  Patient had a urology appointment on 04/28/2019 but he postponed it to 05/12/2019.Marland Kitchen  He saw Dr. Gillermo Murdoch on 05/12/2019 and was scheduled to have right ureteroscopy, laser lithotripsy, stent placement as a staged procedure . Repeat urine culture was sent on 05/19/2019 and it showed Enterococcus faecalis and he was prescribed Levaquin by the urologist.  On 05/27/2019 he underwent cystoscopy and laser lithotripsy and about 50 to 60% staghorn stone was dusted.  He had an uncomplicated ureteral stent placement. He was prescribed nitrofurantoin prophylaxis after the procedure as long as the stent was in place. On 06/17/2019 he went for the second stage of the procedure and underwent further lasering of the stone for 2 hours total.  Significant dust at the conclusion of the procedure limited  vision but no large residual stone identified as per urologist note.  The stent was exchanged. He was on nitrofurantoin after that. He presented on 12/13 with high fever of 1 day.  HE was sent home home on PO augmentin and was called back on 12/15 as  enterococcus in blood.Blood culture from 12/15 was neg for bacteria. ( urine culture on 06/27/20 was neg) . CT scan on 12/15 showed rt ureteral stent, Few residual nonobstructing calculi present in the right kidney and no stag horn calculus. He was given IV ampicillin. TEE on 07/04/19 neg for endocarditis He was sent home on IV ampicillin. On 07/11/19 had cystoscopy and laser lithotripsy , basket extraction of stones and rt ureteral stent exchange as OP. He got 17 days of IV ampicillin and completed IV on 07/14/19. Started Po ampicillin 500mg  Q6 on 07/14/19. After that, underwent stent removal on 07/20/19. 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 Asked him to get labs ( BC, UC, CBC, CMP) and sent a prescription for amoxicillin. Pt did not start amoxicillin until 08/12/19 night.On 08/13/19 Blood culture reported as positive for Enterococcus and patient was asked to go to the ED and he was admitted- he has been on IV ampicillin + Iv gentamicin for possible prosthetic valve endocarditis- Urine culture is negative  Pt is doing betetr today No fveer Cough for a few weeks now No  hematuria or flank pain  Past Medical History:  Diagnosis Date  . Coronary artery disease   . Diabetes ([place])   . H/O aortic valve replacement   . History of kidney stones   . Hx of CABG   . Hyperlipidemia   . Hypertension   . Sleep apnea     Past Surgical History:  Procedure Laterality Date  . AORTIC VALVE REPLACEMENT  2017   [facility], bioprosthetic  . APPENDECTOMY    . CARDIAC VALVE REPLACEMENT    . CORONARY  ARTERY BYPASS GRAFT  2017   [facility] - [facility] and SVG-OM  . CYSTOSCOPY W/ RETROGRADES Right 05/27/2019   Procedure: CYSTOSCOPY WITH RETROGRADE PYELOGRAM;  Surgeon: Billey Co, MD;  Location: [facility];  Service: Urology;  Laterality: Right;  . CYSTOSCOPY W/ RETROGRADES Right 07/11/2019   Procedure: CYSTOSCOPY WITH RETROGRADE PYELOGRAM;  Surgeon: Billey Co, MD;  Location: [facility];  Service: Urology;  Laterality: Right;  . CYSTOSCOPY/URETEROSCOPY/HOLMIUM LASER/STENT PLACEMENT Right 05/27/2019   Procedure: CYSTOSCOPY/URETEROSCOPY/HOLMIUM LASER/STENT PLACEMENT;  Surgeon: Billey Co, MD;  Location: [facility];  Service: Urology;  Laterality: Right;  . CYSTOSCOPY/URETEROSCOPY/HOLMIUM LASER/STENT PLACEMENT Right 06/17/2019   Procedure: CYSTOSCOPY/URETEROSCOPY/HOLMIUM LASER/STENT Exchange;  Surgeon: Billey Co, MD;  Location: [facility];  Service: Urology;  Laterality: Right;  . CYSTOSCOPY/URETEROSCOPY/HOLMIUM LASER/STENT PLACEMENT Right 07/11/2019   Procedure: CYSTOSCOPY/URETEROSCOPY/HOLMIUM LASER/STENT Exchange;  Surgeon: Billey Co, MD;  Location: [facility];  Service: Urology;  Laterality: Right;  . STONE EXTRACTION WITH BASKET Right 07/11/2019   Procedure: STONE EXTRACTION WITH BASKET;  Surgeon: Billey Co, MD;  Location: [facility];  Service: Urology;  Laterality: Right;  . TEE WITHOUT CARDIOVERSION N/A 04/21/2019   Procedure: TRANSESOPHAGEAL ECHOCARDIOGRAM (TEE);  Surgeon: Minna Merritts, MD;  Location: [facility];  Service: Cardiovascular;  Laterality: N/A;  . TEE WITHOUT CARDIOVERSION N/A 07/04/2019   Procedure: TRANSESOPHAGEAL ECHOCARDIOGRAM (TEE);  Surgeon: Wellington Hampshire, MD;  Location: [facility];  Service: Cardiovascular;  Laterality: N/A;  . TONSILLECTOMY      Social History   Socioeconomic History  . Marital status: Married    Spouse name: Not on file  . Number of children: Not on file  . Years of education: Not on file  . Highest education level: Not on  file  Occupational History  . Not on file  Tobacco Use  . Smoking status: Former Research scientist (life sciences)  . Smokeless tobacco: Never Used  Substance and Sexual Activity  . Alcohol use: Never  . Drug use: Never  . Sexual activity: Not on file  Other Topics Concern  . Not on file  Social History Narrative  . Not on file   Social Determinants of Health   Financial Resource Strain:   . Difficulty of Paying Living Expenses: Not on file  Food Insecurity:   . Worried About Charity fundraiser in the Last Year: Not on file  . Ran Out of Food in the Last Year: Not on file  Transportation Needs:   . Lack of Transportation (Medical): Not on file  . Lack of Transportation (Non-Medical): Not on file  Physical Activity:   . Days of Exercise per Week: Not on file  . Minutes of Exercise per Session: Not on file  Stress:   . Feeling of Stress : Not on file  Social Connections:   . Frequency of Communication with Friends and Family: Not on file  . Frequency of Social Gatherings with Friends and Family: Not on file  . Attends Religious  Services: Not on file  . Active Member of Clubs or Organizations: Not on file  . Attends Archivist Meetings: Not on file  . Marital Status: Not on file  Intimate Partner Violence:   . Fear of Current or Ex-Partner: Not on file  . Emotionally Abused: Not on file  . Physically Abused: Not on file  . Sexually Abused: Not on file    Family History  Problem Relation Age of Onset  . Heart attack Father 3   Allergies  Allergen Reactions  . Metformin Diarrhea    Loose stools even with XR   ID  Recent  Procedure Surgery Injections Trauma Sick contacts Travel Antibiotic use Food- raw/exotic Steroid/immune suppressants/splenectomy/Hardware Animal bites Tick exposure Water sports Fishing/hunting/animal bird exposure ? Current Facility-Administered Medications  Medication Dose Route Frequency Provider Last Rate Last Admin  . 0.9 %  sodium chloride  infusion   Intravenous Continuous Ivor Costa, MD   Stopped at 08/15/19 [phone]  . 0.9 %  sodium chloride infusion   Intravenous PRN Thornell Mule, MD   Stopped at 08/15/19 0252  . acetaminophen (TYLENOL) tablet 650 mg  650 mg Oral Q6H PRN Ivor Costa, MD      . amLODipine (NORVASC) tablet 5 mg  5 mg Oral Daily Ivor Costa, MD   5 mg at 08/15/19 0936  . ampicillin (OMNIPEN) 2 g in sodium chloride 0.9 % 100 mL IVPB  2 g Intravenous Q4H Ivor Costa, MD 300 mL/hr at 08/15/19 0752 2 g at 08/15/19 0752  . aspirin EC tablet 81 mg  81 mg Oral Once per day on Mon Wed Fri Niu, Xilin, MD   81 mg at 08/15/19 1010  . atorvastatin (LIPITOR) tablet 80 mg  80 mg Oral q1800 Ivor Costa, MD   80 mg at 08/14/19 1658  . gentamicin (GARAMYCIN) IVPB 80 mg  80 mg Intravenous Q12H Berton Mount, RPH      . guaiFENesin-dextromethorphan (ROBITUSSIN DM) 100-10 MG/5ML syrup 5 mL  5 mL Oral Q4H PRN Ivor Costa, MD   5 mL at 08/14/19 2222  . heparin injection 5,000 Units  5,000 Units Subcutaneous Q8H Ivor Costa, MD   5,000 Units at 08/15/19 0506  . hydrALAZINE (APRESOLINE) tablet 25 mg  25 mg Oral TID PRN Ivor Costa, MD      . insulin aspart (novoLOG) injection 0-5 Units  0-5 Units Subcutaneous QHS Ouma, Bing Neighbors, NP      . insulin aspart (novoLOG) injection 0-9 Units  0-9 Units Subcutaneous TID WC Lang Snow, NP   1 Units at 08/15/19 0750  . insulin aspart protamine- aspart (NOVOLOG MIX 70/30) injection 60 Units  60 Units Subcutaneous BID WC Ivor Costa, MD   60 Units at 08/15/19 0749  . isosorbide mononitrate (IMDUR) 24 hr tablet 60 mg  60 mg Oral Daily Ivor Costa, MD   60 mg at 08/15/19 0935  . loratadine (CLARITIN) tablet 10 mg  10 mg Oral Daily Ivor Costa, MD   10 mg at 08/15/19 0936  . metoprolol succinate (TOPROL-XL) 24 hr tablet 25 mg  25 mg Oral Daily Ivor Costa, MD   25 mg at 08/15/19 0935  . nitroGLYCERIN (NITROSTAT) SL tablet 0.4 mg  0.4 mg Sublingual Q5 min PRN Ivor Costa, MD      . ondansetron  [facility]) injection 4 mg  4 mg Intravenous Q8H PRN Ivor Costa, MD      . pantoprazole (PROTONIX) EC tablet 40 mg  40 mg Oral  Daily Ivor Costa, MD   40 mg at 08/15/19 0935  . senna-docusate (Senokot-S) tablet 1 tablet  1 tablet Oral QHS PRN Ivor Costa, MD         Abtx:  Anti-infectives (From admission, onward)   Start     Dose/Rate Route Frequency Ordered Stop   08/15/19 2200  gentamicin (GARAMYCIN) IVPB 80 mg     80 mg 100 mL/hr over 30 Minutes Intravenous Every 12 hours 08/15/19 1123     08/15/19 0000  gentamicin (GARAMYCIN) 50 mg in dextrose 5 % 50 mL IVPB  Status:  Discontinued     50 mg 102.5 mL/hr over 30 Minutes Intravenous Every 8 hours 08/14/19 1314 08/15/19 1123   08/13/19 2000  gentamicin (GARAMYCIN) 110 mg in dextrose 5 % 50 mL IVPB  Status:  Discontinued     1 mg/kg  111.1 kg 105.5 mL/hr over 30 Minutes Intravenous Every 8 hours 08/13/19 1100 08/14/19 1314   08/13/19 1600  ampicillin (OMNIPEN) 2 g in sodium chloride 0.9 % 100 mL IVPB     2 g 300 mL/hr over 20 Minutes Intravenous Every 4 hours 08/13/19 1100     08/13/19 1200  ampicillin (OMNIPEN) 2 g in sodium chloride 0.9 % 100 mL IVPB     2 g 300 mL/hr over 20 Minutes Intravenous  Once 08/13/19 1100 08/13/19 1158   08/13/19 1200  gentamicin (GARAMYCIN) 110 mg in dextrose 5 % 50 mL IVPB     1 mg/kg  111.1 kg 105.5 mL/hr over 30 Minutes Intravenous  Once 08/13/19 1100 08/13/19 1304   08/13/19 1100  ampicillin (OMNIPEN) 1 g in sodium chloride 0.9 % 100 mL IVPB  Status:  Discontinued     1 g 300 mL/hr over 20 Minutes Intravenous  Once 08/13/19 1056 08/13/19 1100   08/13/19 1100  gentamicin (GARAMYCIN) 220 mg in dextrose 5 % 50 mL IVPB  Status:  Discontinued     2 mg/kg  111.1 kg 111 mL/hr over 30 Minutes Intravenous  Once 08/13/19 1056 08/13/19 1100      REVIEW OF SYSTEMS:  Const: negative fever, negative chills, negative weight loss Eyes: negative diplopia or visual changes, negative eye pain ENT: negative coryza,  negative sore throat Resp: negative cough, hemoptysis, dyspnea Cards: negative for chest pain, palpitations, lower extremity edema GU: negative for frequency, dysuria and hematuria GI: Negative for abdominal pain, diarrhea, bleeding, constipation Skin: negative for rash and pruritus Heme: negative for easy bruising and gum/nose bleeding MS: negative for myalgias, arthralgias, back pain and muscle weakness Neurolo:negative for headaches, dizziness, vertigo, memory problems  Psych: negative for feelings of anxiety, depression  Endocrine: negative for thyroid, diabetes Allergy/Immunology- negative for any medication or food allergies ? Pertinent Positives include : Objective:  VITALS:  BP 125/68 (BP Location: Right Arm)   Pulse 90   Temp 98.2 F (36.8 C) (Oral)   Resp 16   Ht 5\' 9"  (1.753 m)   Wt 111.1 kg   SpO2 97%   BMI 36.18 kg/m  PHYSICAL EXAM:  General: Alert, cooperative, no distress, appears stated age.  Head: Normocephalic, without obvious abnormality, atraumatic. Eyes: Conjunctivae clear, anicteric sclerae. Pupils are equal ENT Nares normal. No drainage or sinus tenderness. Lips, mucosa, and tongue normal. No Thrush Neck: Supple, symmetrical, no adenopathy, thyroid: non tender no carotid bruit and no JVD. Back: No CVA tenderness. Lungs: Clear to auscultation bilaterally. No Wheezing or Rhonchi. No rales. Heart: Regular rate and rhythm, no murmur, rub or gallop. Abdomen:  Soft, non-tender,not distended. Bowel sounds normal. No masses Extremities: atraumatic, no cyanosis. No edema. No clubbing Skin: No rashes or lesions. Or bruising Lymph: Cervical, supraclavicular normal. Neurologic: Grossly non-focal Pertinent Labs Lab Results CBC    Component Value Date/Time   WBC 4.3 08/14/2019 0536   RBC 2.93 (L) 08/14/2019 0536   HGB 10.3 (L) 08/14/2019 0536   HCT 30.4 (L) 08/14/2019 0536   PLT 123 (L) 08/14/2019 0536   MCV 103.8 (H) 08/14/2019 0536   MCH 35.2 (H)  08/14/2019 0536   MCHC 33.9 08/14/2019 0536   RDW 15.3 08/14/2019 0536   LYMPHSABS 1.4 08/13/2019 1100   MONOABS 0.6 08/13/2019 1100   EOSABS 0.1 08/13/2019 1100   BASOSABS 0.0 08/13/2019 1100    CMP Latest Ref Rng & Units 08/15/2019 08/14/2019 08/13/2019  Glucose 70 - 99 mg/dL - 162(H) 274(H)  BUN 8 - 23 mg/dL - 13 20  Creatinine 0.61 - 1.24 mg/dL 0.78 0.74 1.12  Sodium 135 - 145 mmol/L - 138 132(L)  Potassium 3.5 - 5.1 mmol/L - 4.6 4.4  Chloride 98 - 111 mmol/L - 106 101  CO2 22 - 32 mmol/L - 23 22  Calcium 8.9 - 10.3 mg/dL - 8.7(L) 9.0  Total Protein 6.5 - 8.1 g/dL - - 7.4  Total Bilirubin 0.3 - 1.2 mg/dL - - 1.0  Alkaline Phos 38 - 126 U/L - - 58  AST 15 - 41 U/L - - 28  ALT 0 - 44 U/L - - 33      Microbiology: Recent Results (from the past 240 hour(s))  Urine Culture     Status: None   Collection Time: 08/11/19  2:14 PM   Specimen: Urine, Random  Result Value Ref Range Status   Specimen Description   Final    URINE, RANDOM Performed at [facility], [address] [place], [place] [address]    Special Requests   Final    NONE Performed at [facility], [address] [place], [place] [address]    Culture   Final    NO GROWTH Performed at [facility], [place] [address], [place], [place] [address]    Report Status 08/12/2019 FINAL  Final  Blood culture (routine single)     Status: Abnormal   Collection Time: 08/11/19  2:14 PM   Specimen: BLOOD  Result Value Ref Range Status   Specimen Description   Final    BLOOD LEFT HAND Performed at [facility], [address] [place], [place] [address]    Special Requests   Final    BOTTLES DRAWN AEROBIC AND ANAEROBIC Blood Culture adequate volume Performed at [facility], [address] [place], [place] [address]    Culture  Setup Time   Final    GRAM POSITIVE COCCI IN BOTH AEROBIC AND ANAEROBIC BOTTLES CRITICAL RESULT CALLED TO, READ BACK BY AND VERIFIED WITH: DR.  Tsosie Billing AT 0820 08/13/19.PMF Performed at [facility], [address] [place], [place] [address]    Culture ENTEROCOCCUS FAECALIS (A)  Final   Report Status 08/15/2019 FINAL  Final   Organism ID, Bacteria ENTEROCOCCUS FAECALIS  Final      Susceptibility   Enterococcus faecalis - MIC*    AMPICILLIN <=2 SENSITIVE Sensitive     VANCOMYCIN <=0.5 SENSITIVE Sensitive     GENTAMICIN SYNERGY SENSITIVE Sensitive     * ENTEROCOCCUS FAECALIS  Blood culture (routine single)     Status: Abnormal   Collection  Time: 08/11/19  2:14 PM   Specimen: BLOOD  Result Value Ref Range Status   Specimen Description   Final    BLOOD RIGHT HAND Performed at [facility], [address], [place], [place] [address]    Special Requests   Final    BOTTLES DRAWN AEROBIC AND ANAEROBIC Blood Culture adequate volume Performed at [facility], [address] [place], [place] [address]    Culture  Setup Time   Final    IN BOTH AEROBIC AND ANAEROBIC BOTTLES GRAM POSITIVE COCCI CRITICAL RESULT CALLED TO, READ BACK BY AND VERIFIED WITH: DR. Tsosie Billing AT 0820 08/13/19.PMF    Culture (A)  Final    ENTEROCOCCUS FAECALIS SUSCEPTIBILITIES PERFORMED ON PREVIOUS CULTURE WITHIN THE LAST 5 DAYS. Performed at [facility], [place] [address], [place], [place] [address]    Report Status 08/15/2019 FINAL  Final  Blood Culture ID Panel (Reflexed)     Status: Abnormal   Collection Time: 08/11/19  2:14 PM  Result Value Ref Range Status   Enterococcus species DETECTED (A) NOT DETECTED Final    Comment: CRITICAL RESULT CALLED TO, READ BACK BY AND VERIFIED WITH: DR. Tsosie Billing AT 0820 08/13/19.PMF    Vancomycin resistance NOT DETECTED NOT DETECTED Final   Listeria monocytogenes NOT DETECTED NOT DETECTED Final   Staphylococcus species NOT DETECTED NOT DETECTED Final   Staphylococcus aureus (BCID) NOT DETECTED NOT DETECTED Final   Streptococcus species NOT  DETECTED NOT DETECTED Final   Streptococcus agalactiae NOT DETECTED NOT DETECTED Final   Streptococcus pneumoniae NOT DETECTED NOT DETECTED Final   Streptococcus pyogenes NOT DETECTED NOT DETECTED Final   Acinetobacter baumannii NOT DETECTED NOT DETECTED Final   Enterobacteriaceae species NOT DETECTED NOT DETECTED Final   Enterobacter cloacae complex NOT DETECTED NOT DETECTED Final   Escherichia coli NOT DETECTED NOT DETECTED Final   Klebsiella oxytoca NOT DETECTED NOT DETECTED Final   Klebsiella pneumoniae NOT DETECTED NOT DETECTED Final   Proteus species NOT DETECTED NOT DETECTED Final   Serratia marcescens NOT DETECTED NOT DETECTED Final   Haemophilus influenzae NOT DETECTED NOT DETECTED Final   Neisseria meningitidis NOT DETECTED NOT DETECTED Final   Pseudomonas aeruginosa NOT DETECTED NOT DETECTED Final   Candida albicans NOT DETECTED NOT DETECTED Final   Candida glabrata NOT DETECTED NOT DETECTED Final   Candida krusei NOT DETECTED NOT DETECTED Final   Candida parapsilosis NOT DETECTED NOT DETECTED Final   Candida tropicalis NOT DETECTED NOT DETECTED Final    Comment: Performed at [facility], [address] [place], [place] [address]  Blood Culture (routine x 2)     Status: None (Preliminary result)   Collection Time: 08/13/19 11:00 AM   Specimen: BLOOD  Result Value Ref Range Status   Specimen Description BLOOD R HAND  Final   Special Requests   Final    BOTTLES DRAWN AEROBIC AND ANAEROBIC Blood Culture adequate volume   Culture  Setup Time ANAEROBIC BOTTLE ONLY  Final   Culture   Final    NO GROWTH 2 DAYS Performed at [facility], [address] [place],  [address]    Report Status PENDING  Incomplete  Blood Culture (routine x 2)     Status: None (Preliminary result)   Collection Time: 08/13/19 11:00 AM   Specimen: BLOOD  Result Value Ref Range Status   Specimen Description BLOOD L AC  Final   Special Requests   Final    BOTTLES DRAWN  AEROBIC AND ANAEROBIC Blood Culture adequate volume   Culture   Final    NO GROWTH 2 DAYS Performed at [facility], [address] [place], [place] [address]    Report Status PENDING  Incomplete  Urine culture     Status: None   Collection Time: 08/13/19 11:00 AM   Specimen: In/Out Cath Urine  Result Value Ref Range Status   Specimen Description   Final    IN/OUT CATH URINE Performed at [facility], [address] [place], [place] [address]    Special Requests   Final    NONE Performed at [facility], [address] [place], [place] [address]    Culture   Final    NO GROWTH Performed at [facility], [place] [address] [place], [place] [address]    Report Status 08/15/2019 FINAL  Final  Respiratory Panel by RT PCR (Flu A&B, Covid) - Nasopharyngeal Swab     Status: None   Collection Time: 08/13/19 11:00 AM   Specimen: Nasopharyngeal Swab  Result Value Ref Range Status   SARS Coronavirus 2 by RT PCR NEGATIVE NEGATIVE Final    Comment: (NOTE) SARS-CoV-2 target nucleic acids are NOT DETECTED. The SARS-CoV-2 RNA is generally detectable in upper respiratoy specimens during the acute phase of infection. The lowest concentration of SARS-CoV-2 viral copies this assay can detect is 131 copies/mL. A negative result does not preclude SARS-Cov-2 infection and should not be used as the sole basis for treatment or other patient management decisions. A negative result may occur with  improper specimen collection/handling, submission of specimen other than nasopharyngeal swab, presence of viral mutation(s) within the areas targeted by this assay, and inadequate number of viral copies (<131 copies/mL). A negative result must be combined with clinical observations, patient history, and epidemiological information. The expected result is Negative. Fact Sheet for Patients:  PinkCheek.be Fact Sheet for Healthcare Providers:   GravelBags.it This test is not yet ap proved or cleared by the [place] FDA and  has been authorized for detection and/or diagnosis of SARS-CoV-2 by FDA under an Emergency Use Authorization (EUA). This EUA will remain  in effect (meaning this test can be used) for the duration of the COVID-19 declaration under Section 564(b)(1) of the Act, 21 U.S.C. section 360bbb-3(b)(1), unless the authorization is terminated or revoked sooner.    Influenza A by PCR NEGATIVE NEGATIVE Final   Influenza B by PCR NEGATIVE NEGATIVE Final    Comment: (NOTE) The Xpert Xpress SARS-CoV-2/FLU/RSV assay is intended as an aid in  the diagnosis of influenza from Nasopharyngeal swab specimens and  should not be used as a sole basis for treatment. Nasal washings and  aspirates are unacceptable for Xpert Xpress SARS-CoV-2/FLU/RSV  testing. Fact Sheet for Patients: PinkCheek.be Fact Sheet for Healthcare Providers: GravelBags.it This test is not yet approved or cleared by the [place] FDA and  has been authorized for detection and/or diagnosis of SARS-CoV-2 by  FDA under an Emergency Use Authorization (EUA). This EUA will remain  in effect (meaning this test can be used) for the duration of the  Covid-19 declaration under Section 564(b)(1) of the Act, 21  U.S.C. section 360bbb-3(b)(1), unless the authorization is  terminated or revoked. Performed at [facility], [address] [place],  [address]     IMAGING RESULTS:  I have personally reviewed the films ?No infiltrate Impression/Recommendation Enterococcus bacteremia: Recurrent , previously in October and Dec was due to renal stone/stent/UTI. 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 TEE done on 12/21 and  In Oct 2020 wasnegative. Will  have to treat like PVE. He is currently on Ampicillin + Gentamicin. HE will need either this combo or Amp+ ceftriaxone combo Discussed with his son. His concern is that  The patient continues to work and a Q4 dose nor  a 24 hr infusion of ampicillin is going to work. They prefer Once or twice a day regimen Vanco + gent combination is another option but that is not a renal friendly regimen especially at his age. Will do literature review to see whether Dapto+ another agent can be used  Son not keen on TEE. TEE will not change the management I,e duration will not change and he will be treated like endocarditis . It is only for baseline assessment to see any ring abscess ( remote) or Aortic valve leak. Will get cardiology input.    H/o rt stag horn calculus with lithotripsy, stents in October- Dec /Jan- followed by Dr.Sninsky. Ultrasound kidney shows mild fullness rt renal pelvis.Son is requesting CT and urology input Recommend urology consult  H/O CABG/AVR- on metoprolol and Isosorbide  HTN on on amlodipine ? ? ? ___________________________________________________ Discussed with patient, son and care team

## 2019-08-15 NOTE — Progress Notes (Signed)
*  PRELIMINARY RESULTS* Echocardiogram 2D Echocardiogram has been performed.  Jonette Mate Ahmiya Abee 08/15/2019, 7:19 PM

## 2019-08-15 NOTE — Progress Notes (Addendum)
Brief Narrative:  74 y.o. male with medical history significant of hypertension, hyperlipidemia, diabetes mellitus, GERD, OSA, CAD, CABG, kidney stone, aortic valve replacement, hx of Enterococcus sepsis, who presents with positive blood culture.  Pt has hx of Enterococcus sepsis in the past, which is related to kidney stones. He is being followed by ID, Dr. Delaine Lame. His outpatient blood culture 08/11/19 was positive for enterococcus (4/4), therefor he was sent to ED by Dr. Ramon Dredge for further evaluation and treatment.  Patient states that he had fever and chills in Wednesday, Thursday and Friday, but not today.  He had mild right flank pain which has resolved.  He had nausea and vomited twice yesterday, currently no nausea, vomiting, diarrhea or abdominal pain.  Patient has mild dry cough, no have chest pain. Denies symptoms of UTI or hematuria.  No unilateral weakness.  Patient has generalized weakness  Subjective: No fever since admission.  Doing okay so far.  Objective: Vital signs in last 24 hours: Temp:  [97.4 F (36.3 C)-98.5 F (36.9 C)] 97.6 F (36.4 C) (02/01 1152) Pulse Rate:  [85-90] 89 (02/01 1152) Resp:  [16-20] 20 (02/01 1152) BP: (117-138)/(67-78) 138/78 (02/01 1152) SpO2:  [94 %-98 %] 98 % (02/01 1152)  Intake/Output from previous day: 01/31 0701 - 02/01 0700 In: 2213.5 [I.V.:1604] Out: 3025 [Urine:3025] Intake/Output this shift: Total I/O In: -  Out: 450 [Urine:450]  General: Not in acute distress HEENT:       Eyes: PERRL, EOMI, no scleral icterus.       ENT: No discharge from the ears and nose, no pharynx injection, no tonsillar enlargement.        Neck: No JVD, no bruit, no mass felt. Heme: No neck lymph node enlargement. Cardiac: S1/S2, RRR, No murmurs, No gallops or rubs. Respiratory:  No rales, wheezing, rhonchi or rubs. GI: Soft, nondistended, nontender, no rebound pain, no organomegaly, BS present. GU: No hematuria Ext: No pitting leg edema  bilaterally. 2+DP/PT pulse bilaterally. Musculoskeletal: No joint deformities, No joint redness or warmth, no limitation of ROM in spin. Skin: No rashes.  Neuro: Alert, oriented X3, cranial nerves II-XII grossly intact, moves all extremities normally. Psych: Patient is not psychotic, no suicidal or hemocidal ideation.  Results for orders placed or performed during the hospital encounter of 08/13/19 (from the past 24 hour(s))  Glucose, capillary     Status: Abnormal   Collection Time: 08/14/19  4:40 PM  Result Value Ref Range   Glucose-Capillary 168 (H) 70 - 99 mg/dL   Comment 1 Notify RN   Glucose, capillary     Status: Abnormal   Collection Time: 08/14/19  9:43 PM  Result Value Ref Range   Glucose-Capillary 129 (H) 70 - 99 mg/dL  Glucose, capillary     Status: None   Collection Time: 08/15/19 12:18 AM  Result Value Ref Range   Glucose-Capillary 79 70 - 99 mg/dL  Creatinine, serum     Status: None   Collection Time: 08/15/19  5:36 AM  Result Value Ref Range   Creatinine, Ser 0.78 0.61 - 1.24 mg/dL   GFR calc non Af Amer >60 >60 mL/min   GFR calc Af Amer >60 >60 mL/min  Glucose, capillary     Status: Abnormal   Collection Time: 08/15/19  7:31 AM  Result Value Ref Range   Glucose-Capillary 133 (H) 70 - 99 mg/dL  Glucose, capillary     Status: Abnormal   Collection Time: 08/15/19 11:34 AM  Result Value Ref Range  Glucose-Capillary 187 (H) 70 - 99 mg/dL    Studies/Results: US Renal  Result Date: 08/13/2019 CLINICAL DATA:  Evaluate for hydronephrosis. History of right kidney stones. EXAM: RENAL / URINARY TRACT ULTRASOUND COMPLETE COMPARISON:  CT scan June 28, 2019 FINDINGS: Right Kidney: Renal measurements: 12.8 x 5.9 x 5.2 cm = volume: 206 mL. Contains a 1.8 cm cyst. Mild prominence of the right renal pelvis without caliectasis. Left Kidney: Renal measurements: 14.2 x 6.2 x 5.8 cm = volume: 269 mL. Contains a 5 cm cyst. No hydronephrosis. Bladder: Appears normal for degree of  bladder distention. Other: None. IMPRESSION: 1. Mild prominence of the right renal pelvis without caliectasis. No gross hydronephrosis. 2. Bilateral renal cysts. Electronically Signed   By: Dorise Bullion III M.D   On: 08/13/2019 12:51   DG Chest Port 1 View  Result Date: 08/13/2019 CLINICAL DATA:  Sepsis.  Fever.  Cough. EXAM: PORTABLE CHEST 1 VIEW COMPARISON:  06/26/2019 FINDINGS: Prior median sternotomy. Midline trachea. Borderline cardiomegaly. Atherosclerosis in the transverse aorta. No pleural effusion or pneumothorax. Clear lungs. IMPRESSION: No acute cardiopulmonary disease. Aortic Atherosclerosis (ICD10-I70.0). Electronically Signed   By: Abigail Miyamoto M.D.   On: 08/13/2019 11:33    Scheduled Meds: . amLODipine  5 mg Oral Daily  . aspirin EC  81 mg Oral Once per day on Mon Wed Fri  . atorvastatin  80 mg Oral q1800  . heparin  5,000 Units Subcutaneous Q8H  . insulin aspart  0-5 Units Subcutaneous QHS  . insulin aspart  0-9 Units Subcutaneous TID WC  . insulin aspart protamine- aspart  60 Units Subcutaneous BID WC  . isosorbide mononitrate  60 mg Oral Daily  . loratadine  10 mg Oral Daily  . metoprolol succinate  25 mg Oral Daily  . pantoprazole  40 mg Oral Daily   Continuous Infusions: . sodium chloride Stopped (08/15/19 0506)  . sodium chloride Stopped (08/15/19 0252)  . ampicillin (OMNIPEN) IV 2 g (08/15/19 0752)  . gentamicin     PRN Meds:sodium chloride, acetaminophen, guaiFENesin-dextromethorphan, hydrALAZINE, nitroGLYCERIN, ondansetron (ZOFRAN) IV, senna-docusate  Assessment/Plan:  Assessment/Plan Principal Problem:   Bacteremia due to Enterococcus Active Problems:   Essential hypertension   Type 2 diabetes mellitus without complication, with long-term current use of insulin (HCC)   Hyperlipidemia LDL goal <70   GERD (gastroesophageal reflux disease)   Coronary artery disease   Bacteremia due to Enterococcus: Continue antibiotic treatment as per ID  recommendation  - Per Dr. 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 Currently he has no stent. 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 His blood cultures sent on 08/11/19 as OP was positive for enterococcus (4/4). Dr. Steva Ready recommended to put pt on " IV Amp+ gent combo ( rather than AMP+ ceftriaxone as previously discussedif creatinine okay) .  -Creatinine remains within normal limit; continue to monitor creatinine   - He will need 6 weeks of Iv antibiotics (and gent will be switched to ceftriaxone later)" - PICC line cannot be placed until he clears the bacteremia per Dr. Delaine Lame -Blood culture ordered on admission on 08/13/2019 prelim shows 1/2 bottle is growing gram-positive cocci in aerobic bottle.   -Follow further ID recs -IV ampicillin and gentamicin   -Urine culture from 08/13/2019 is so far negative -f/u Bx and Ux negative so far -Follow-up Ultrasound -- Negative for any hydro or acute issues   -IVF continued  HTN: Stable -Continue home medications: Amlodipine, metoprolol -Continue to hold lisinopril -hydralazine prn  Type 2 diabetes mellitus without complication, with long-term current use of insulin ([place]):  Stable  - most recent A1c 8.0, poorly controled. Patient is taking glipizide, NovoLog, 70/30 insulin at home -will decrease 70/30 insulin dose from 90 to 60 units bid -SSI  Hyperlipidemia LDL goal <70 -lipitor  GERD (gastroesophageal reflux disease): -protonix  Coronary artery disease: s/p of CABG. No CP -on ASA, lipitor, Imdur and prn NGT  DVT prophylaxis    LOS: 2 days   Shantel Wesely Izetta Dakin

## 2019-08-15 NOTE — Consult Note (Signed)
Pharmacy Antibiotic Note  Jonathan Cross Jonathan Cross. is a 74 y.o. male admitted on 08/13/2019 with bacteremia.  Pharmacy has been consulted for ampicillin and gentamicin dosing. Blood cultures from 1/28 have been reported 4/4 Enterococcus with a h/o enterococcal bacteremia. Based on ID recommendations we started ampicillin + gentamicin for synergy  gentamicin steady-state levels: 1/31 0536 8 mcg/mL (dose - 110mg  at 04:57) 1/31 1128 1.4 mcg/mL  Calculated Pk Parameters  T1/2: 2.33h Ke: 0.297 h-1  Plan: 1) continue ampicillin 2 grams IV every 4 hours  2) change gentamicin dose to 80mg  IV q12h based on age and levels (note peak is difficult to interpret as it was drawn at end of infusion thus did not allow for distribution of drug)   Follow renal function closely: SCr in am  Gentamicin peak and trough following the 3rd dose of new regimen  Goal gentamicin levels:   Peak: 3 - 4 mg/L  Trough: <1 mg/L  Height: 5\' 9"  (175.3 cm) Weight: 245 lb (111.1 kg) IBW/kg (Calculated) : 70.7  Temp (24hrs), Avg:98 F (36.7 C), Min:97.4 F (36.3 C), Max:98.5 F (36.9 C)  Recent Labs  Lab 08/11/19 1414 08/13/19 1100 08/14/19 0536 08/14/19 1128 08/15/19 0536  WBC 4.6 4.3 4.3  --   --   CREATININE 1.08 1.12 0.74  --  0.78  LATICACIDVEN  --  1.7  2.1*  --   --   --   GENTTROUGH  --   --   --  1.4  --   GENTPEAK  --   --  8.5  --   --     Estimated Creatinine Clearance: 98.1 mL/min (by C-G formula based on SCr of 0.78 mg/dL).    Antimicrobials this admission: 1/30 ampicillin >>  1/30 gentamicin >>   Microbiology results: 1/28 BCx: Enterococcus sp 4/4 (amp susc) 1/28 UCx: pending   Thank you for allowing pharmacy to be a part of this patient's care.  Doreene Eland, PharmD, BCPS.   Work Cell: [phone] 08/15/2019 11:27 AM

## 2019-08-15 NOTE — Progress Notes (Signed)
PHARMACY - PHYSICIAN COMMUNICATION CRITICAL VALUE ALERT - BLOOD CULTURE IDENTIFICATION (BCID)  Jonathan Cross. is an 74 y.o. male who presented to [facility] on 08/13/2019 with a chief complaint of fever/chills  Assessment:  Pt here with Enterococcus bacteremia (1/28 BCx 4/4 bottles)   Name of physician (or Provider) Contacted: Dr Izetta Dakin and Dr Delaine Lame  Current antibiotics: ampicillin and gentamicin   Changes to prescribed antibiotics recommended:  No changes - continue amp and gent (confirmed per ID MD)   Results for orders placed or performed during the hospital encounter of 08/11/19  Blood Culture ID Panel (Reflexed) (Collected: 08/11/2019  2:14 PM)  Result Value Ref Range   Enterococcus species DETECTED (A) NOT DETECTED   Vancomycin resistance NOT DETECTED NOT DETECTED   Listeria monocytogenes NOT DETECTED NOT DETECTED   Staphylococcus species NOT DETECTED NOT DETECTED   Staphylococcus aureus (BCID) NOT DETECTED NOT DETECTED   Streptococcus species NOT DETECTED NOT DETECTED   Streptococcus agalactiae NOT DETECTED NOT DETECTED   Streptococcus pneumoniae NOT DETECTED NOT DETECTED   Streptococcus pyogenes NOT DETECTED NOT DETECTED   Acinetobacter baumannii NOT DETECTED NOT DETECTED   Enterobacteriaceae species NOT DETECTED NOT DETECTED   Enterobacter cloacae complex NOT DETECTED NOT DETECTED   Escherichia coli NOT DETECTED NOT DETECTED   Klebsiella oxytoca NOT DETECTED NOT DETECTED   Klebsiella pneumoniae NOT DETECTED NOT DETECTED   Proteus species NOT DETECTED NOT DETECTED   Serratia marcescens NOT DETECTED NOT DETECTED   Haemophilus influenzae NOT DETECTED NOT DETECTED   Neisseria meningitidis NOT DETECTED NOT DETECTED   Pseudomonas aeruginosa NOT DETECTED NOT DETECTED   Candida albicans NOT DETECTED NOT DETECTED   Candida glabrata NOT DETECTED NOT DETECTED   Candida krusei NOT DETECTED NOT DETECTED   Candida parapsilosis NOT DETECTED NOT DETECTED   Candida  tropicalis NOT DETECTED NOT DETECTED    [name] 08/15/2019  12:39 PM

## 2019-08-16 DIAGNOSIS — G4733 Obstructive sleep apnea (adult) (pediatric): Secondary | ICD-10-CM

## 2019-08-16 DIAGNOSIS — E119 Type 2 diabetes mellitus without complications: Secondary | ICD-10-CM

## 2019-08-16 DIAGNOSIS — I25118 Atherosclerotic heart disease of native coronary artery with other forms of angina pectoris: Secondary | ICD-10-CM

## 2019-08-16 DIAGNOSIS — I350 Nonrheumatic aortic (valve) stenosis: Secondary | ICD-10-CM

## 2019-08-16 DIAGNOSIS — E785 Hyperlipidemia, unspecified: Secondary | ICD-10-CM

## 2019-08-16 DIAGNOSIS — N202 Calculus of kidney with calculus of ureter: Secondary | ICD-10-CM

## 2019-08-16 LAB — CREATININE, SERUM
Creatinine, Ser: 0.69 mg/dL (ref 0.61–1.24)
GFR calc Af Amer: >60 mL/min (ref >60)
GFR calc non Af Amer: >60 mL/min (ref >60)

## 2019-08-16 LAB — BLOOD CULTURE ID PANEL (REFLEXED)

## 2019-08-16 LAB — GENTAMICIN LEVEL, PEAK: Gentamicin Pk: 4.2 ug/mL — ABNORMAL LOW (ref 5.0–10.0)

## 2019-08-16 LAB — GLUCOSE, CAPILLARY
Glucose-Capillary: 155 mg/dL — ABNORMAL HIGH (ref 70–99)
Glucose-Capillary: 174 mg/dL — ABNORMAL HIGH (ref 70–99)
Glucose-Capillary: 212 mg/dL — ABNORMAL HIGH (ref 70–99)
Glucose-Capillary: 185 mg/dL — ABNORMAL HIGH (ref 70–99)

## 2019-08-16 LAB — ECHOCARDIOGRAM COMPLETE
Height: 69 in
Weight: 3920 oz

## 2019-08-16 NOTE — Care Management Important Message (Signed)
Important Message  Patient Details  Name: Jonathan Cross. MRN: [id number] Date of Birth: [birth year]   Medicare Important Message Given:  Yes     Dannette Barbara 08/16/2019, 10:59 AM

## 2019-08-16 NOTE — Progress Notes (Signed)
ID Says he gets SOB while lying flat on the bed with some left sided tightness No fever Patient Vitals for the past 24 hrs:  BP Temp Temp src Pulse Resp SpO2  08/16/19 1203 119/67 98.1 F (36.7 C) -- 92 16 98 %  08/16/19 0543 138/83 98.6 F (37 C) Oral 88 20 97 %  08/15/19 2021 107/64 (!) 97.5 F (36.4 C) Oral 91 20 97 %   O/e awake and alert Chest few basal crepts HSs1s2 Abd soft  CBC Latest Ref Rng & Units 08/14/2019 08/13/2019 08/11/2019  WBC 4.0 - 10.5 K/uL 4.3 4.3 4.6  Hemoglobin 13.0 - 17.0 g/dL 10.3(L) 11.0(L) 11.6(L)  Hematocrit 39.0 - 52.0 % 30.4(L) 33.0(L) 34.9(L)  Platelets 150 - 400 K/uL 123(L) 128(L) 129(L)     CMP Latest Ref Rng & Units 08/16/2019 08/15/2019 08/14/2019  Glucose 70 - 99 mg/dL - - 162(H)  BUN 8 - 23 mg/dL - - 13  Creatinine 0.61 - 1.24 mg/dL 0.69 0.78 0.74  Sodium 135 - 145 mmol/L - - 138  Potassium 3.5 - 5.1 mmol/L - - 4.6  Chloride 98 - 111 mmol/L - - 106  CO2 22 - 32 mmol/L - - 23  Calcium 8.9 - 10.3 mg/dL - - 8.7(L)  Total Protein 6.5 - 8.1 g/dL - - -  Total Bilirubin 0.3 - 1.2 mg/dL - - -  Alkaline Phos 38 - 126 U/L - - -  AST 15 - 41 U/L - - -  ALT 0 - 44 U/L - - -   Impression Enterococcus bacteremia: Recurrent , previously in October and Dec was due to renal stone/stent/UTI and then urine culture was positive for enterococcus This admission  urine culture neg and 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 Will have to treat like PVE. He is currently on Ampicillin + Gentamicin. HE will need either this combo or Amp+ ceftriaxone combo Discussed with his son. His concern is that  The patient continues to work and a Q4 dose nor  a 24 hr infusion of ampicillin is going to work. They prefer Once or twice a day regimen Vanco + gent combination is another option but that is not a renal friendly regimen especially at his age.  Daptmycin+ ceftaroline is not an ideal combo and is  more expensive with a higher copay and needs PA  Appreciate cardiology input- pt is interested in getting TEE   H/o rt stag horn calculus with lithotripsy, stents in October- Dec /Jan- followed by Dr.Sninsky. Ultrasound kidney shows mild fullness rt renal pelvis.appreciate urology input  H/O CABG/AVR- on metoprolol and Isosorbide  HTN on on amlodipine ? Discussed with patient- ?

## 2019-08-16 NOTE — Consult Note (Signed)
Cardiology Consultation:   Patient ID: Jonathan Cross. MRN: [id number]; DOB: 1946/02/26  Admit date: 08/13/2019 Date of Consult: 08/16/2019  Primary Care Provider: Georges Mouse, MD Primary Cardiologist: Nelva Bush, MD  Primary Electrophysiologist:  None    Patient Profile:   Jonathan Zeldin. is a 74 y.o. male with a hx of CAD with significant three-vessel CAD as outlined below, CABG, AS s/p bioprosthetic aortic valve replacement [facility], 2017), hypertension, hyperlipidemia, previous history of smoking (quit 40 years ago), DM2, GERD, OSA not on CPAP, kidney stone, history of previous bacteremia / sepsis, and who is being seen today for the evaluation of bacteremia with need for TEE to rule out endocarditis at the request of Dr. Izetta Dakin.  History of Present Illness:   Jonathan Cross is a 74 year old male with PMH as above.  He reports today that he does not have a primary cardiologist; however, he has been seen by [facility] several times when in the hospital and outpatient setting.  He has a history of significant three-vessel CAD as outlined under CV studies below with most recent cath 10/05/2017 at [facility].  He underwent CABG and bioprosthetic aortic valve replacement in 2017.  He reports a previous history of smoking; however, he reportedly quit approximately 40 years ago.  He does not drink alcohol or use illegal drugs. He recalls a history of bacteremia / enterococcus faecalis and has needed a transesophageal echo in the past to rule out endocarditis.  TEE was negative for endocarditis.  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  He then completed 2 weeks of IV antibiotics and underwent lithotripsy.  He was seen in our office 05/12/2019 for chest pain and preoperative cardiovascular evaluation before his lithotripsy on 05/27/2019.  At that time, he reported chronic chest pain ever since CABG/AVR in 2017 and is outlined below.  He was told that small vessel disease was  contributing to his discomfort.  He felt relief with sublingual nitroglycerin though noted CP was occurring more frequent recently.  He reported intermittent orthostatic lightheadedness without syncope, which worsened when he leaned over to pick up an object.  He underwent Lexiscan Myoview 05/2019 that was ruled a low risk study with hyperdynamic EF and no evidence for ischemia.  Last week, the patient reports feeling fever, chills, and nausea.  He reports several episodes of emesis.  He reports that the emesis and nausea eventually dissipated; however, his fever persisted even after his other symptoms resolved.  Therefore, he decided to seek medical attention.  He had an outpatient blood culture 08/11/2019 that was positive for Enterococcus; therefore, he was sent to the [facility] ED by Dr. Delaine Lame for further evaluation and treatment.   Today, he reports a history of left-sided chest pain that has been ongoing for several years, consistent with his previous outpatient visit.  He states this chest pain is usually brief and lasts approximately 4 minutes.  It is further described as nonradiating and nonpleuritic.  No clear aggravating factors.  It occurs both at rest and with exertion.  He experiences complete relief with sublingual nitro.  He denies any current or previous racing heart rate or palpitations.  He does note intermittent dizziness, even at rest.  This is consistent with his previous orthostatic hypotension as noted above.  He does note occasional shortness of breath and dyspnea on exertion.  He states he works lifting heavy metal each day and sometimes notes SOB/DOE that quickly improves with only a few minutes rest.  No CP with those episodes. No syncope or loss of consciousness.  No reported lower extremity edema, abdominal distention, or orthopnea. He reports a history of sleep apnea; however, he has not been using his CPAP.  He states today that this is 2/2 a problem with the CPAP.  He frequently  eats out at restaurants.    On consultation today, he reports current mild chest pain as previously described (dating back to office visit in October) and that is not different from his previous episodes.  He also notes fatigue.   Heart Pathway Score:     Past Medical History:  Diagnosis Date  . Coronary artery disease   . Diabetes ([place])   . H/O aortic valve replacement   . History of kidney stones   . Hx of CABG   . Hyperlipidemia   . Hypertension   . Sleep apnea     Past Surgical History:  Procedure Laterality Date  . AORTIC VALVE REPLACEMENT  2017   [facility], bioprosthetic  . APPENDECTOMY    . CARDIAC VALVE REPLACEMENT    . CORONARY ARTERY BYPASS GRAFT  2017   [facility] - LIMA-LAD and SVG-OM  . CYSTOSCOPY W/ RETROGRADES Right 05/27/2019   Procedure: CYSTOSCOPY WITH RETROGRADE PYELOGRAM;  Surgeon: Billey Co, MD;  Location: [facility] ORS;  Service: Urology;  Laterality: Right;  . CYSTOSCOPY W/ RETROGRADES Right 07/11/2019   Procedure: CYSTOSCOPY WITH RETROGRADE PYELOGRAM;  Surgeon: Billey Co, MD;  Location: [facility] ORS;  Service: Urology;  Laterality: Right;  . CYSTOSCOPY/URETEROSCOPY/HOLMIUM LASER/STENT PLACEMENT Right 05/27/2019   Procedure: CYSTOSCOPY/URETEROSCOPY/HOLMIUM LASER/STENT PLACEMENT;  Surgeon: Billey Co, MD;  Location: [facility] ORS;  Service: Urology;  Laterality: Right;  . CYSTOSCOPY/URETEROSCOPY/HOLMIUM LASER/STENT PLACEMENT Right 06/17/2019   Procedure: CYSTOSCOPY/URETEROSCOPY/HOLMIUM LASER/STENT Exchange;  Surgeon: Billey Co, MD;  Location: [facility] ORS;  Service: Urology;  Laterality: Right;  . CYSTOSCOPY/URETEROSCOPY/HOLMIUM LASER/STENT PLACEMENT Right 07/11/2019   Procedure: CYSTOSCOPY/URETEROSCOPY/HOLMIUM LASER/STENT Exchange;  Surgeon: Billey Co, MD;  Location: [facility] ORS;  Service: Urology;  Laterality: Right;  . STONE EXTRACTION WITH BASKET Right 07/11/2019   Procedure: STONE EXTRACTION WITH BASKET;  Surgeon: Billey Co, MD;  Location: [facility]  ORS;  Service: Urology;  Laterality: Right;  . TEE WITHOUT CARDIOVERSION N/A 04/21/2019   Procedure: TRANSESOPHAGEAL ECHOCARDIOGRAM (TEE);  Surgeon: Minna Merritts, MD;  Location: [facility] ORS;  Service: Cardiovascular;  Laterality: N/A;  . TEE WITHOUT CARDIOVERSION N/A 07/04/2019   Procedure: TRANSESOPHAGEAL ECHOCARDIOGRAM (TEE);  Surgeon: Wellington Hampshire, MD;  Location: [facility] ORS;  Service: Cardiovascular;  Laterality: N/A;  . TONSILLECTOMY       Home Medications:  Prior to Admission medications   Medication Sig Start Date End Date Taking? Authorizing Provider  amLODipine (NORVASC) 5 MG tablet Take 5 mg by mouth daily. 03/08/19  Yes [provider]  amoxicillin (AMOXIL) 500 MG capsule Take 1 capsule (500 mg total) by mouth 3 (three) times daily. 08/11/19  Yes Tsosie Billing, MD  aspirin EC 81 MG tablet Take 81 mg by mouth 3 (three) times a week.  05/30/14  Yes [provider]  atorvastatin (LIPITOR) 80 MG tablet Take 80 mg by mouth daily. 08/13/18  Yes [provider]  glipiZIDE (GLUCOTROL XL) 2.5 MG 24 hr tablet Take 2.5 mg by mouth daily with breakfast.  05/05/19  Yes [provider]  insulin aspart (NOVOLOG) 100 UNIT/ML injection Inject 0-5 Units into the skin at bedtime. 04/23/19  Yes Epifanio Lesches, MD  isosorbide mononitrate (IMDUR) 60 MG 24 hr  tablet Take 1 tablet (60 mg total) by mouth daily. 05/12/19 08/13/19 Yes End, Harrell Gave, MD  lisinopril (ZESTRIL) 20 MG tablet Take 1 tablet (20 mg total) by mouth daily. Please call to schedule appointment for further refills. Thank you! 08/09/19 05/05/20 Yes End, Harrell Gave, MD  loratadine (CLARITIN) 10 MG tablet Take 10 mg by mouth daily. 09/13/18 09/13/19 Yes [provider]  metoprolol succinate (TOPROL-XL) 25 MG 24 hr tablet Take 25 mg by mouth daily. 12/08/18 09/04/19 Yes [provider]  nitroGLYCERIN (NITROSTAT) 0.4 MG SL tablet Place 0.4 mg under the tongue every 5 (five) minutes  as needed for chest pain.  03/24/18 08/13/19 Yes [provider]  NOVOLIN 70/30 RELION (70-30) 100 UNIT/ML injection Inject 90 Units into the skin 2 (two) times daily.  05/13/19  Yes [provider]  omeprazole (PRILOSEC) 20 MG capsule Take 20 mg by mouth daily as needed (acid reflux).  04/22/18 08/13/19 Yes [provider]  Potassium Citrate 15 MEQ (1620 MG) TBCR Take 2 tablets by mouth 2 (two) times daily. 07/20/19  Yes Billey Co, MD    Inpatient Medications: Scheduled Meds: . amLODipine  5 mg Oral Daily  . aspirin EC  81 mg Oral Once per day on Mon Wed Fri  . atorvastatin  80 mg Oral q1800  . heparin  5,000 Units Subcutaneous Q8H  . insulin aspart  0-5 Units Subcutaneous QHS  . insulin aspart  0-9 Units Subcutaneous TID WC  . insulin aspart protamine- aspart  60 Units Subcutaneous BID WC  . isosorbide mononitrate  60 mg Oral Daily  . loratadine  10 mg Oral Daily  . metoprolol succinate  25 mg Oral Daily  . pantoprazole  40 mg Oral Daily  . psyllium  1 packet Oral Daily   Continuous Infusions: . sodium chloride Stopped (08/16/19 0850)  . sodium chloride Stopped (08/15/19 0252)  . ampicillin (OMNIPEN) IV 2 g (08/16/19 1527)  . gentamicin 80 mg (08/16/19 1041)   PRN Meds: sodium chloride, acetaminophen, guaiFENesin-dextromethorphan, hydrALAZINE, magnesium hydroxide, nitroGLYCERIN, ondansetron (ZOFRAN) IV, senna-docusate  Allergies:    Allergies  Allergen Reactions  . Metformin Diarrhea    Loose stools even with XR    Social History:   Social History   Socioeconomic History  . Marital status: Married    Spouse name: Not on file  . Number of children: Not on file  . Years of education: Not on file  . Highest education level: Not on file  Occupational History  . Not on file  Tobacco Use  . Smoking status: Former Research scientist (life sciences)  . Smokeless tobacco: Never Used  Substance and Sexual Activity  . Alcohol use: Never  . Drug use: Never  . Sexual  activity: Not on file  Other Topics Concern  . Not on file  Social History Narrative  . Not on file   Social Determinants of Health   Financial Resource Strain:   . Difficulty of Paying Living Expenses: Not on file  Food Insecurity:   . Worried About Charity fundraiser in the Last Year: Not on file  . Ran Out of Food in the Last Year: Not on file  Transportation Needs:   . Lack of Transportation (Medical): Not on file  . Lack of Transportation (Non-Medical): Not on file  Physical Activity:   . Days of Exercise per Week: Not on file  . Minutes of Exercise per Session: Not on file  Stress:   . Feeling of Stress : Not  on file  Social Connections:   . Frequency of Communication with Friends and Family: Not on file  . Frequency of Social Gatherings with Friends and Family: Not on file  . Attends Religious Services: Not on file  . Active Member of Clubs or Organizations: Not on file  . Attends Archivist Meetings: Not on file  . Marital Status: Not on file  Intimate Partner Violence:   . Fear of Current or Ex-Partner: Not on file  . Emotionally Abused: Not on file  . Physically Abused: Not on file  . Sexually Abused: Not on file    Family History:    Family History  Problem Relation Age of Onset  . Heart attack Father 52     ROS:  Please see the history of present illness.  Review of Systems  Constitutional: Positive for chills, fever and malaise/fatigue.  Respiratory: Positive for cough and shortness of breath. Negative for hemoptysis.   Cardiovascular: Positive for chest pain. Negative for palpitations, orthopnea, leg swelling and PND.       Reports a history of chronic chest pain that is left-sided, nonradiating, nonpleuritic, and without aggravating or alleviating factors.  Usually lasts 3 to 4 minutes.  Gastrointestinal: Positive for abdominal pain, nausea and vomiting. Negative for blood in stool and melena.       No current nausea or emesis   Genitourinary: Negative for hematuria.  Musculoskeletal: Negative for falls.  Neurological: Positive for dizziness. Negative for loss of consciousness.       Dizziness is not current but reported as intermittent  Psychiatric/Behavioral: Negative for substance abuse.    All other ROS reviewed and negative.     Physical Exam/Data:   Vitals:   08/15/19 1152 08/15/19 2021 08/16/19 0543 08/16/19 1203  BP: 138/78 107/64 138/83 119/67  Pulse: 89 91 88 92  Resp: 20 20 20 16   Temp: 97.6 F (36.4 C) (!) 97.5 F (36.4 C) 98.6 F (37 C) 98.1 F (36.7 C)  TempSrc: Oral Oral Oral   SpO2: 98% 97% 97% 98%  Weight:      Height:        Intake/Output Summary (Last 24 hours) at 08/16/2019 1711 Last data filed at 08/16/2019 0854 Gross per 24 hour  Intake 2295.46 ml  Output 1080 ml  Net 1215.46 ml   Last 3 Weights 08/13/2019 07/21/2019 07/20/2019  Weight (lbs) 245 lb 244 lb 244 lb  Weight (kg) 111.131 kg 110.678 kg 110.678 kg     Body mass index is 36.18 kg/m.  General:  Well nourished, well developed, in no acute distress HEENT: normal Neck: JVD difficult to assess due to body habitus Vascular: No carotid bruits; radial pulses 2+ bilaterally Cardiac:  normal S1, S2; RRR; 3/6 systolic murmur Lungs: Bilateral rhonchi, wheezing, crackles Abd: soft, nontender, no hepatomegaly  Ext: no edema Musculoskeletal:  No deformities, BUE and BLE strength normal and equal Skin: warm and dry  Neuro:  No focal abnormalities noted Psych:  Normal affect   EKG:  The EKG was personally reviewed and demonstrates: EKG not available per review of EMR.  Not yet scanned in. Telemetry:  Telemetry was personally reviewed and demonstrates: NSR, 80s  Relevant CV Studies: Echo 08/16/19 1. Left ventricular ejection fraction, by visual estimation, is 60 to  65%. The left ventricle has normal function. Left ventricular septal wall  thickness was mildly increased. Mildly increased left ventricular  posterior wall  thickness. There is mildly  increased left ventricular hypertrophy.  2. The  left ventricle has no regional wall motion abnormalities.  3. Global right ventricle has normal systolic function.The right  ventricular size is mildly enlarged. No increase in right ventricular wall  thickness.  4. Left atrial size was normal.  5. Right atrial size was normal.  6. The mitral valve is grossly normal. Trivial mitral valve  regurgitation.  7. The tricuspid valve is grossly normal.  8. The tricuspid valve is grossly normal. Tricuspid valve regurgitation  is mild.  9. The aortic valve was not well visualized. Aortic valve regurgitation  is not visualized. Mild to moderate aortic valve sclerosis/calcification  without any evidence of aortic stenosis.  10. The pulmonic valve was not well visualized. Pulmonic valve  regurgitation is trivial.  11. The aortic root was not well visualized.  12. Mildly elevated pulmonary artery systolic pressure.  13. The interatrial septum was not assessed.   [place] 05/18/2019  The study is normal.  This is a low risk study.  The left ventricular ejection fraction is hyperdynamic (>65%). EF 71%  No evidence for ischemia   Cardiovascular History & Procedures: Cardiovascular Problems:  Coronary artery disease status post CABG  Aortic stenosis status post bioprosthetic aortic valve replacement  Risk Factors:  No coronary artery disease, hypertension, diabetes mellitus, male gender, age greater than 62  Cath/PCI:  LHC (10/05/2017, [facility]): Significant three-vessel coronary disease with 80% ostial LAD and 70% OM1 stenoses.  50% stenosis of the proximal RPDA 90% lesion of small RPL branch.  Patent LIMA to LAD and SVG to OM.  LHC (02/08/2016): Significant two-vessel CAD with 80% ostial LAD and 90% RPL stenoses (small-caliber).  FFR of ostial LAD 0.80.  LVEDP 12 mmHg.  CV Surgery:  CABG/AVR (02/11/2016, [facility]): LIMA-LAD, SVG-OM, 23 mm  bioprosthetic AVR.  EP Procedures and Devices:  None  Non-Invasive Evaluation(s):  TTE (10/12/2017): Technically difficult study.  LVEF 65-70%.  Mitral annular calcification.  Normal aortic prosthetic valve function.  Normal RV function.  TTE (01/14/2017): Normal LV size with LVEF 55-60%.  Grade 1 diastolic dysfunction.  Mitral annular calcification.  Aortic valve replacement with mean gradient of 13 mmHg.  Mildly dilated ascending aorta.  Low normal RV systolic function.  Pharmacologic myocardial perfusion stress test (08/01/2015): Normal study without ischemia or scar.  LVEF greater than 65%.  TTE (07/31/2015): Normal LV size with basal septal hypertrophy.  LVEF 55-60% with grade 1 diastolic dysfunction.  Normal RV size and function.  Mild to moderate aortic stenosis (mean gradient 20 mmHg).  TTE (07/31/2015): Normal LV size with mild LVH.  LVEF 60-65% with grade 1 diastolic dysfunction.  Moderate aortic stenosis (mean gradient 21 mmHg).  Normal RV size and function.  Laboratory Data:  High Sensitivity Troponin:  No results for input(s): TROPONINIHS in the last 720 hours.   Cardiac EnzymesNo results for input(s): TROPONINI in the last 168 hours. No results for input(s): TROPIPOC in the last 168 hours.  Chemistry Recent Labs  Lab 08/11/19 1414 08/11/19 1414 08/13/19 1100 08/13/19 1100 08/14/19 0536 08/15/19 0536 08/16/19 0533  NA 133*  --  132*  --  138  --   --   K 4.7  --  4.4  --  4.6  --   --   CL 101  --  101  --  106  --   --   CO2 24  --  22  --  23  --   --   GLUCOSE 159*  --  274*  --  162*  --   --  BUN 17  --  20  --  13  --   --   CREATININE 1.08   < > 1.12   < > 0.74 0.78 0.69  CALCIUM 9.5  --  9.0  --  8.7*  --   --   GFRNONAA >60   < > >60   < > >60 >60 >60  GFRAA >60   < > >60   < > >60 >60 >60  ANIONGAP 8  --  9  --  9  --   --    < > = values in this interval not displayed.    Recent Labs  Lab 08/11/19 1414 08/19/19 1100  PROT 7.5 7.4  ALBUMIN 3.9 3.6   AST 33 28  ALT 40 33  ALKPHOS 48 58  BILITOT 0.8 1.0   Hematology Recent Labs  Lab 08/11/19 1414 2019-08-19 1100 08/14/19 0536  WBC 4.6 4.3 4.3  RBC 3.33* 3.19* 2.93*  HGB 11.6* 11.0* 10.3*  HCT 34.9* 33.0* 30.4*  MCV 104.8* 103.4* 103.8*  MCH 34.8* 34.5* 35.2*  MCHC 33.2 33.3 33.9  RDW 15.5 15.5 15.3  PLT 129* 128* 123*   BNPNo results for input(s): BNP, PROBNP in the last 168 hours.  DDimer No results for input(s): DDIMER in the last 168 hours.   Radiology/Studies:  US Renal  Result Date: 2019-08-19 CLINICAL DATA:  Evaluate for hydronephrosis. History of right kidney stones. EXAM: RENAL / URINARY TRACT ULTRASOUND COMPLETE COMPARISON:  CT scan June 28, 2019 FINDINGS: Right Kidney: Renal measurements: 12.8 x 5.9 x 5.2 cm = volume: 206 mL. Contains a 1.8 cm cyst. Mild prominence of the right renal pelvis without caliectasis. Left Kidney: Renal measurements: 14.2 x 6.2 x 5.8 cm = volume: 269 mL. Contains a 5 cm cyst. No hydronephrosis. Bladder: Appears normal for degree of bladder distention. Other: None. IMPRESSION: 1. Mild prominence of the right renal pelvis without caliectasis. No gross hydronephrosis. 2. Bilateral renal cysts. Electronically Signed   By: Dorise Bullion III M.D   On: 08-19-2019 12:51   DG Chest Port 1 View  Result Date: 08-19-2019 CLINICAL DATA:  Sepsis.  Fever.  Cough. EXAM: PORTABLE CHEST 1 VIEW COMPARISON:  06/26/2019 FINDINGS: Prior median sternotomy. Midline trachea. Borderline cardiomegaly. Atherosclerosis in the transverse aorta. No pleural effusion or pneumothorax. Clear lungs. IMPRESSION: No acute cardiopulmonary disease. Aortic Atherosclerosis (ICD10-I70.0). Electronically Signed   By: Abigail Miyamoto M.D.   On: August 19, 2019 11:33   ECHOCARDIOGRAM COMPLETE  Result Date: 08/16/2019   ECHOCARDIOGRAM REPORT   Patient Name:   Jonathan Cross. Date of Exam: 08/15/2019 Medical Rec #:  [id number]              Height:       69.0 in Accession #:    [id number]              Weight:       245.0 lb Date of Birth:  [birth year]              BSA:          2.25 m Patient Age:    62 years               BP:           138/78 mmHg Patient Gender: M                      HR:  89 bpm. Exam Location:  [facility] Procedure: 2D Echo, Cardiac Doppler and Color Doppler Indications:     Aortic Valve Disorder 424.1  History:         Patient has no prior history of Echocardiogram examinations.                  CAD; Risk Factors:Hypertension.  Sonographer:     Alyse Low Roar Referring Phys:  [place] Diagnosing Phys: Bartholome Bill MD IMPRESSIONS  1. Left ventricular ejection fraction, by visual estimation, is 60 to 65%. The left ventricle has normal function. Left ventricular septal wall thickness was mildly increased. Mildly increased left ventricular posterior wall thickness. There is mildly increased left ventricular hypertrophy.  2. The left ventricle has no regional wall motion abnormalities.  3. Global right ventricle has normal systolic function.The right ventricular size is mildly enlarged. No increase in right ventricular wall thickness.  4. Left atrial size was normal.  5. Right atrial size was normal.  6. The mitral valve is grossly normal. Trivial mitral valve regurgitation.  7. The tricuspid valve is grossly normal.  8. The tricuspid valve is grossly normal. Tricuspid valve regurgitation is mild.  9. The aortic valve was not well visualized. Aortic valve regurgitation is not visualized. Mild to moderate aortic valve sclerosis/calcification without any evidence of aortic stenosis. 10. The pulmonic valve was not well visualized. Pulmonic valve regurgitation is trivial. 11. The aortic root was not well visualized. 12. Mildly elevated pulmonary artery systolic pressure. 13. The interatrial septum was not assessed. FINDINGS  Left Ventricle: Left ventricular ejection fraction, by visual estimation, is 60 to 65%. The left ventricle has normal function. The left  ventricle has no regional wall motion abnormalities. Mildly increased left ventricular posterior wall thickness. There is mildly increased left ventricular hypertrophy. Right Ventricle: The right ventricular size is mildly enlarged. No increase in right ventricular wall thickness. Global RV systolic function is has normal systolic function. The tricuspid regurgitant velocity is 2.39 m/s, and with an assumed right atrial  pressure of 10 mmHg, the estimated right ventricular systolic pressure is mildly elevated at 32.8 mmHg. Left Atrium: Left atrial size was normal in size. Right Atrium: Right atrial size was normal in size Pericardium: There is no evidence of pericardial effusion. Mitral Valve: The mitral valve is grossly normal. Trivial mitral valve regurgitation. Tricuspid Valve: The tricuspid valve is grossly normal. Tricuspid valve regurgitation is mild. Aortic Valve: The aortic valve was not well visualized. Aortic valve regurgitation is not visualized. Mild to moderate aortic valve sclerosis/calcification is present, without any evidence of aortic stenosis. Aortic valve mean gradient measures 15.3 mmHg. Aortic valve peak gradient measures 28.1 mmHg. Aortic valve area, by VTI measures 1.06 cm. Pulmonic Valve: The pulmonic valve was not well visualized. Pulmonic valve regurgitation is trivial. Pulmonic regurgitation is trivial. Aorta: The aortic root was not well visualized. IAS/Shunts: The interatrial septum was not assessed.  LEFT VENTRICLE PLAX 2D LVIDd:         3.99 cm  Diastology LVIDs:         2.71 cm  LV e' lateral:   9.57 cm/s LV PW:         1.25 cm  LV E/e' lateral: 7.9 LV IVS:        1.41 cm  LV e' medial:    5.77 cm/s LVOT diam:     1.70 cm  LV E/e' medial:  13.1 LV SV:         42 ml LV  SV Index:   17.86 LVOT Area:     2.27 cm  RIGHT VENTRICLE RV S prime:     12.90 cm/s LEFT ATRIUM             Index       RIGHT ATRIUM           Index LA diam:        4.00 cm 1.78 cm/m  RA Area:     10.30 cm LA Vol  (A2C):   42.7 ml 18.96 ml/m RA Volume:   18.40 ml  8.17 ml/m LA Vol (A4C):   52.9 ml 23.49 ml/m LA Biplane Vol: 47.6 ml 21.14 ml/m  AORTIC VALVE                    PULMONIC VALVE AV Area (Vmax):    0.99 cm     PV Vmax:        1.07 m/s AV Area (Vmean):   1.12 cm     PV Peak grad:   4.6 mmHg AV Area (VTI):     1.06 cm     RVOT Peak grad: 3 mmHg AV Vmax:           265.00 cm/s AV Vmean:          180.333 cm/s AV VTI:            0.462 m AV Peak Grad:      28.1 mmHg AV Mean Grad:      15.3 mmHg LVOT Vmax:         116.00 cm/s LVOT Vmean:        89.100 cm/s LVOT VTI:          0.216 m LVOT/AV VTI ratio: 0.47  AORTA Ao Root diam: 3.30 cm MITRAL VALVE                         TRICUSPID VALVE MV Area (PHT): 2.84 cm              TR Peak grad:   22.8 mmHg MV PHT:        77.43 msec            TR Vmax:        243.00 cm/s MV Decel Time: 267 msec MV E velocity: 75.70 cm/s  103 cm/s  SHUNTS MV A velocity: 119.00 cm/s 70.3 cm/s Systemic VTI:  0.22 m MV E/A ratio:  0.64        1.5       Systemic Diam: 1.70 cm  Bartholome Bill MD Electronically signed by Bartholome Bill MD Signature Date/Time: 08/16/2019/4:58:43 PM    Final     Assessment and Plan:   Bacteremia with bioprosthetic aortic valve --Given his bacteremia, scheduled for transesophageal echocardiogram tomorrow 08/17/2019 to r/o endocarditis. The risks and benefits of transesophageal echocardiogram have been explained including risks of esophageal damage, perforation (1:10,000 risk), bleeding, pharyngeal hematoma as well as other potential complications associated with conscious sedation including aspiration, arrhythmia, respiratory failure and death. Alternatives to treatment were discussed, questions were answered. Patient is willing to proceed.  Most recent labs reviewed and renal function and electrolytes stable. Repeat BMET tomorrow AM.  N.p.o. after midnight.  Aortic stenosis s/p bioprosthetic aortic valve --As above.  Recent echocardiogram copied and pasted above with  need for transesophageal echo to rule out endocarditis in the setting of bacteremia.  Continue ASA and antibiotics per IM.  CAD with stable  angina s/p CABG --Reports current mild left-sided CP as outlined above.  He has a long history of chest pain, dating back to at least 3 to 4 years before he was seen in the office 04/2019.  2019 [facility] catheterization showed three-vessel CAD as copied and pasted above.  His pain is somewhat atypical and occurs at exertion, lasting only minutes.  It is responsive to nitroglycerin.  He underwent Lexiscan Myoview 05/18/2019 that was ruled a low risk study.  Given his pain is identical to that previously described in office, no further work-up indicated at this time.  Continue current cardiac medications with ASA, Imdur, BB, Lipitor, as needed sublingual nitro.  Recommend close outpatient follow-up.  HTN -BP well controlled today.  Recommend daily BMET.  Continue current medical management.  Hyperlipidemia --Continue high intensity statin therapy for secondary prevention.  DM2 --Poorly controlled.  A1c 8.0.  Recommend strict glycemic control for risk factor modification.  Sliding scale insulin.  Per IM.  OSA not on a CPAP --Recommend CPAP compliance, discussed with patient today.  Consider care management consult to discuss obtaining CPAP mask replacement.  Anemia --Daily CBC. Per IM.    Arvil Chaco, PA-C 08/16/2019 5:40 PM    For questions or updates, please contact [place] Please consult [email or web address] for contact info under     Signed, Arvil Chaco, PA-C  08/16/2019 5:11 PM

## 2019-08-16 NOTE — TOC Initial Note (Signed)
Transition of Care (TOC) - Initial/Assessment Note    Patient Details  Name: Jonathan Cross. MRN: [id number] Date of Birth: [birth year]  Transition of Care [facility]) CM/SW Contact:    Candie Chroman, LCSW Phone Number: 08/16/2019, 1:39 PM  Clinical Narrative:   Readmission prevention screen complete. CSW met with patient. No supports at bedside. CSW introduced role and explained that discharge planning would be discussed. Patient did not have home health services prior to admission. He was active with [facility] until 12/31. Patient uses no equipment at home and states he still works as much as he can. Patient unsure who his PCP is but CSW explained per chart, it is Dr. Mikki Santee. Pharmacy is [facility]. No issues getting medications. Patient still drives. No further concerns. CSW encouraged patient to contact CSW as needed. CSW will continue to follow patient for support and facilitate return home when stable.               Expected Discharge Plan: Home/Self Care Barriers to Discharge: Continued Medical Work up   Patient Goals and CMS Choice        Expected Discharge Plan and Services Expected Discharge Plan: Home/Self Care       Living arrangements for the past 2 months: Single Family Home                                      Prior Living Arrangements/Services Living arrangements for the past 2 months: Single Family Home Lives with:: Spouse, Adult Children Patient language and need for interpreter reviewed:: Yes Do you feel safe going back to the place where you live?: Yes      Need for Family Participation in Patient Care: Yes (Comment) Care giver support system in place?: Yes (comment)   Criminal Activity/Legal Involvement Pertinent to Current Situation/Hospitalization: No - Comment as needed  Activities of Daily Living Home Assistive Devices/Equipment: None ADL Screening (condition at time of admission) Patient's cognitive ability  adequate to safely complete daily activities?: Yes Is the patient deaf or have difficulty hearing?: No Does the patient have difficulty seeing, even when wearing glasses/contacts?: No Does the patient have difficulty concentrating, remembering, or making decisions?: No Patient able to express need for assistance with ADLs?: Yes Does the patient have difficulty dressing or bathing?: No Independently performs ADLs?: Yes (appropriate for developmental age) Does the patient have difficulty walking or climbing stairs?: No Weakness of Legs: None Weakness of Arms/Hands: None  Permission Sought/Granted                  Emotional Assessment Appearance:: Appears stated age Attitude/Demeanor/Rapport: Engaged, Gracious Affect (typically observed): Accepting, Appropriate, Calm, Pleasant Orientation: : Oriented to Self, Oriented to Place, Oriented to  Time, Oriented to Situation Alcohol / Substance Use: Not Applicable Psych Involvement: No (comment)  Admission diagnosis:  Bacteremia [R78.81] Sepsis without acute organ dysfunction, due to unspecified organism [facility]) [A41.9] Patient Active Problem List   Diagnosis Date Noted  . Coronary artery disease   . Hypoalbuminemia 06/29/2019  . Hyperglycemia 06/29/2019  . Lactic acidosis 06/29/2019  . GERD (gastroesophageal reflux disease) 06/29/2019  . 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 Nonrheumatic aortic valve stenosis 05/13/2019  . Hyperlipidemia LDL goal <70 05/13/2019  . Preop cardiovascular exam 05/13/2019  . Bacteremia due to Enterococcus 04/18/2019  . Class 2 severe obesity due to excess calories with serious comorbidity and body mass  index (BMI) of 38.0 to 38.9 in adult [facility]) 03/24/2018  . History of aortic valve replacement with bioprosthetic valve 08/07/2016  . Status post coronary artery bypass graft 08/07/2016  . Coronary artery disease of native artery of native heart with stable angina pectoris ([place]) 02/11/2016  . SVT  (supraventricular tachycardia) ([place]) 07/31/2015  . Type 2 diabetes mellitus without complication, with long-term current use of insulin ([place]) 03/28/2015  . Benign prostatic hyperplasia 04/07/2014  . OSA (obstructive sleep apnea) 04/07/2014  . Essential hypertension 05/16/2009   PCP:  Georges Mouse, MD Pharmacy:   [facility] [address] ([address]), [place] [address] [place])  [address] Phone: [phone] Fax: [place], [place] - [address] [address] [facility] [place] [address] Phone: [phone] Fax: [phone]     Social Determinants of Health ([place]) Interventions    Readmission Risk Interventions Readmission Risk Prevention Plan 08/16/2019 06/30/2019  Transportation Screening Complete Complete  PCP or Specialist Appt within 3-5 Days Complete -  [place]RI or [place] - Patient refused  Social Work Consult for [place] Planning/Counseling - Complete  Palliative Care Screening Not Applicable Not Applicable  Medication Review (RN Care Manager) Complete Complete  Some recent data might be hidden

## 2019-08-16 NOTE — Consult Note (Addendum)
Urology Consult  I have been asked to see the patient by Dr. Izetta Dakin, for evaluation and management of Enterococcus bacteremia with recent history of right staghorn uric acid stone s/p staged URS with laser lithotripsy with recurrent Enterococcus bacteremia.  Chief Complaint: Fever, malaise  History of Present Illness: Jonathan Cross. is a 74 y.o. year old male admitted on 08/13/2019 with Enterococcus bacteremia with recent history of fever and malaise.   Recent medical history significant for right staghorn uric acid stone; he completed staged ureteroscopy with laser lithotripsy with Dr. Diamantina Providence for management of this on 07/11/2019 with no residual right-sided stones on direct visualization. Treatment course complicated by recurrent Enterococcus bacteremia, managed by Dr. Delaine Lame.  Renal US on admission with no bilateral hydronephrosis. Creatinine at baseline, 0.69. Urine cultures from 08/11/2019 and 08/13/2019 both negative.  Patient denies flank pain and denies difficulty urinating.  Past Medical History:  Diagnosis Date  . Coronary artery disease   . Diabetes ([place])   . H/O aortic valve replacement   . History of kidney stones   . Hx of CABG   . Hyperlipidemia   . Hypertension   . Sleep apnea     Past Surgical History:  Procedure Laterality Date  . AORTIC VALVE REPLACEMENT  2017   [facility], bioprosthetic  . APPENDECTOMY    . CARDIAC VALVE REPLACEMENT    . CORONARY ARTERY BYPASS GRAFT  2017   [facility] - [facility] and SVG-OM  . CYSTOSCOPY W/ RETROGRADES Right 05/27/2019   Procedure: CYSTOSCOPY WITH RETROGRADE PYELOGRAM;  Surgeon: Billey Co, MD;  Location: [facility] ORS;  Service: Urology;  Laterality: Right;  . CYSTOSCOPY W/ RETROGRADES Right 07/11/2019   Procedure: CYSTOSCOPY WITH RETROGRADE PYELOGRAM;  Surgeon: Billey Co, MD;  Location: [facility] ORS;  Service: Urology;  Laterality: Right;  . CYSTOSCOPY/URETEROSCOPY/HOLMIUM LASER/STENT PLACEMENT Right 05/27/2019   Procedure: CYSTOSCOPY/URETEROSCOPY/HOLMIUM LASER/STENT PLACEMENT;  Surgeon: Billey Co, MD;  Location: [facility] ORS;  Service: Urology;  Laterality: Right;  . CYSTOSCOPY/URETEROSCOPY/HOLMIUM LASER/STENT PLACEMENT Right 06/17/2019   Procedure: CYSTOSCOPY/URETEROSCOPY/HOLMIUM LASER/STENT Exchange;  Surgeon: Billey Co, MD;  Location: [facility] ORS;  Service: Urology;  Laterality: Right;  . CYSTOSCOPY/URETEROSCOPY/HOLMIUM LASER/STENT PLACEMENT Right 07/11/2019   Procedure: CYSTOSCOPY/URETEROSCOPY/HOLMIUM LASER/STENT Exchange;  Surgeon: Billey Co, MD;  Location: [facility] ORS;  Service: Urology;  Laterality: Right;  . STONE EXTRACTION WITH BASKET Right 07/11/2019   Procedure: STONE EXTRACTION WITH BASKET;  Surgeon: Billey Co, MD;  Location: [facility] ORS;  Service: Urology;  Laterality: Right;  . TEE WITHOUT CARDIOVERSION N/A 04/21/2019   Procedure: TRANSESOPHAGEAL ECHOCARDIOGRAM (TEE);  Surgeon: Minna Merritts, MD;  Location: [facility] ORS;  Service: Cardiovascular;  Laterality: N/A;  . TEE WITHOUT CARDIOVERSION N/A 07/04/2019   Procedure: TRANSESOPHAGEAL ECHOCARDIOGRAM (TEE);  Surgeon: Wellington Hampshire, MD;  Location: [facility] ORS;  Service: Cardiovascular;  Laterality: N/A;  . TONSILLECTOMY      Home Medications:  Current Meds  Medication Sig  . amLODipine (NORVASC) 5 MG tablet Take 5 mg by mouth daily.  Marland Kitchen amoxicillin (AMOXIL) 500 MG capsule Take 1 capsule (500 mg total) by mouth 3 (three) times daily.  Marland Kitchen aspirin EC 81 MG tablet Take 81 mg by mouth 3 (three) times a week.   Marland Kitchen atorvastatin (LIPITOR) 80 MG tablet Take 80 mg by mouth daily.  Marland Kitchen glipiZIDE (GLUCOTROL XL) 2.5 MG 24 hr tablet Take 2.5 mg by mouth daily with breakfast.   . insulin aspart (NOVOLOG) 100 UNIT/ML injection Inject 0-5 Units into the  skin at bedtime.  . isosorbide mononitrate (IMDUR) 60 MG 24 hr tablet Take 1 tablet (60 mg total) by mouth daily.  Marland Kitchen lisinopril (ZESTRIL) 20 MG tablet Take 1 tablet (20 mg total) by mouth daily.  Please call to schedule appointment for further refills. Thank you!  Marland Kitchen loratadine (CLARITIN) 10 MG tablet Take 10 mg by mouth daily.  . metoprolol succinate (TOPROL-XL) 25 MG 24 hr tablet Take 25 mg by mouth daily.  . nitroGLYCERIN (NITROSTAT) 0.4 MG SL tablet Place 0.4 mg under the tongue every 5 (five) minutes as needed for chest pain.   Marland Kitchen NOVOLIN 70/30 RELION (70-30) 100 UNIT/ML injection Inject 90 Units into the skin 2 (two) times daily.   Marland Kitchen omeprazole (PRILOSEC) 20 MG capsule Take 20 mg by mouth daily as needed (acid reflux).   . Potassium Citrate 15 MEQ (1620 MG) TBCR Take 2 tablets by mouth 2 (two) times daily.    Allergies:  Allergies  Allergen Reactions  . Metformin Diarrhea    Loose stools even with XR    Family History  Problem Relation Age of Onset  . Heart attack Father 31    Social History:  reports that he has quit smoking. He has never used smokeless tobacco. He reports that he does not drink alcohol or use drugs.  ROS: A complete review of systems was performed.  All systems are negative except for pertinent findings as noted.  Physical Exam:  Vital signs in last 24 hours: Temp:  [97.5 F (36.4 C)-98.6 F (37 C)] 98.1 F (36.7 C) (02/02 1203) Pulse Rate:  [88-92] 92 (02/02 1203) Resp:  [16-20] 16 (02/02 1203) BP: (107-138)/(64-83) 119/67 (02/02 1203) SpO2:  [97 %-98 %] 98 % (02/02 1203) Constitutional:  Alert and oriented, no acute distress HEENT: [place] AT, moist mucus membranes Cardiovascular: No clubbing, cyanosis, or edema Respiratory: Normal respiratory effort, lungs clear bilaterally Skin: No rashes, bruises or suspicious lesions Neurologic: Grossly intact, no focal deficits, moving all 4 extremities Psychiatric: Normal mood and affect  Laboratory Data:  Recent Labs    08/14/19 0536  WBC 4.3  HGB 10.3*  HCT 30.4*   Recent Labs    08/14/19 0536 08/15/19 0536 08/16/19 0533  NA 138  --   --   K 4.6  --   --   CL 106  --   --   CO2 23  --    --   GLUCOSE 162*  --   --   BUN 13  --   --   CREATININE 0.74 0.78 0.69  CALCIUM 8.7*  --   --    Urinalysis    Component Value Date/Time   COLORURINE YELLOW (A) 08/13/2019 1100   APPEARANCEUR CLEAR (A) 08/13/2019 1100   APPEARANCEUR Cloudy (A) 06/07/2019 1409   LABSPEC 1.011 08/13/2019 1100   PHURINE 5.0 08/13/2019 1100   GLUCOSEU 50 (A) 08/13/2019 1100   HGBUR NEGATIVE 08/13/2019 1100   BILIRUBINUR NEGATIVE 08/13/2019 1100   BILIRUBINUR Negative 06/07/2019 1409   KETONESUR NEGATIVE 08/13/2019 1100   PROTEINUR NEGATIVE 08/13/2019 1100   NITRITE NEGATIVE 08/13/2019 [place] 08/13/2019 1100   Results for orders placed or performed during the hospital encounter of 08/13/19  Blood Culture (routine x 2)     Status: None (Preliminary result)   Collection Time: 08/13/19 11:00 AM   Specimen: BLOOD RIGHT HAND  Result Value Ref Range Status   Specimen Description   Final    BLOOD RIGHT HAND Performed  at [facility], [place] [address] [place], [place] [address]    Special Requests   Final    BOTTLES DRAWN AEROBIC AND ANAEROBIC Blood Culture adequate volume   Culture  Setup Time   Final    ANAEROBIC BOTTLE ONLY GRAM POSITIVE COCCI AEROBIC BOTTLE ONLY CRITICAL RESULT CALLED TO, READ BACK BY AND VERIFIED WITH: [facility] HALLAJI AT [id number] 08/15/2019 [place] Performed at [facility], [address] [place], [place] [address]    Culture GRAM POSITIVE COCCI  Final   Report Status PENDING  Incomplete  Blood Culture (routine x 2)     Status: None (Preliminary result)   Collection Time: 08/13/19 11:00 AM   Specimen: BLOOD  Result Value Ref Range Status   Specimen Description   Final    BLOOD L AC Performed at [facility], [address], [place], [place] [address]    Special Requests   Final    BOTTLES DRAWN AEROBIC AND ANAEROBIC Blood Culture adequate volume Performed at [facility], [address], [place], [place] [address]     Culture  Setup Time   Final    GRAM POSITIVE COCCI IN BOTH AEROBIC AND ANAEROBIC BOTTLES CRITICAL RESULT CALLED TO, READ BACK BY AND VERIFIED WITH: SCOTT HALL 08/16/19 AT 0012 HS Performed at [facility], [place] [address], [place], [place] [address]    Culture GRAM POSITIVE COCCI  Final   Report Status PENDING  Incomplete  Urine culture     Status: None   Collection Time: 08/13/19 11:00 AM   Specimen: In/Out Cath Urine  Result Value Ref Range Status   Specimen Description   Final    IN/OUT CATH URINE Performed at [facility], [address] [place], [place] [address]    Special Requests   Final    NONE Performed at [facility], [address] [place], [place] [address]    Culture   Final    NO GROWTH Performed at [facility], [place] [address] [place],  [address]    Report Status 08/15/2019 FINAL  Final  Respiratory Panel by RT PCR (Flu A&B, Covid) - Nasopharyngeal Swab     Status: None   Collection Time: 08/13/19 11:00 AM   Specimen: Nasopharyngeal Swab  Result Value Ref Range Status   SARS Coronavirus 2 by RT PCR NEGATIVE NEGATIVE Final    Comment: (NOTE) SARS-CoV-2 target nucleic acids are NOT DETECTED. The SARS-CoV-2 RNA is generally detectable in upper respiratoy specimens during the acute phase of infection. The lowest concentration of SARS-CoV-2 viral copies this assay can detect is 131 copies/mL. A negative result does not preclude SARS-Cov-2 infection and should not be used as the sole basis for treatment or other patient management decisions. A negative result may occur with  improper specimen collection/handling, submission of specimen other than nasopharyngeal swab, presence of viral mutation(s) within the areas targeted by this assay, and inadequate number of viral copies (<131 copies/mL). A negative result must be combined with clinical observations, patient history, and epidemiological information. The expected result is  Negative. Fact Sheet for Patients:  PinkCheek.be Fact Sheet for Healthcare Providers:  GravelBags.it This test is not yet ap proved or cleared by the [place] FDA and  has been authorized for detection and/or diagnosis of SARS-CoV-2 by FDA under an Emergency Use Authorization (EUA). This EUA will remain  in effect (meaning this test can be used) for the duration of the COVID-19 declaration under  Section 564(b)(1) of the Act, 21 U.S.C. section 360bbb-3(b)(1), unless the authorization is terminated or revoked sooner.    Influenza A by PCR NEGATIVE NEGATIVE Final   Influenza B by PCR NEGATIVE NEGATIVE Final    Comment: (NOTE) The Xpert Xpress SARS-CoV-2/FLU/RSV assay is intended as an aid in  the diagnosis of influenza from Nasopharyngeal swab specimens and  should not be used as a sole basis for treatment. Nasal washings and  aspirates are unacceptable for Xpert Xpress SARS-CoV-2/FLU/RSV  testing. Fact Sheet for Patients: PinkCheek.be Fact Sheet for Healthcare Providers: GravelBags.it This test is not yet approved or cleared by the [place] FDA and  has been authorized for detection and/or diagnosis of SARS-CoV-2 by  FDA under an Emergency Use Authorization (EUA). This EUA will remain  in effect (meaning this test can be used) for the duration of the  Covid-19 declaration under Section 564(b)(1) of the Act, 21  U.S.C. section 360bbb-3(b)(1), unless the authorization is  terminated or revoked. Performed at [facility], [address] [place], [place] [address]   Blood Culture ID Panel (Reflexed)     Status: Abnormal   Collection Time: 08/13/19 11:00 AM  Result Value Ref Range Status   Enterococcus species DETECTED (A) NOT DETECTED Final    Comment: CRITICAL RESULT CALLED TO, READ BACK BY AND VERIFIED WITH: SCOTT HALL 08/16/19 AT 0012 HS     Vancomycin resistance NOT DETECTED NOT DETECTED Final   Listeria monocytogenes NOT DETECTED NOT DETECTED Final   Staphylococcus species NOT DETECTED NOT DETECTED Final   Staphylococcus aureus (BCID) NOT DETECTED NOT DETECTED Final   Streptococcus species NOT DETECTED NOT DETECTED Final   Streptococcus agalactiae NOT DETECTED NOT DETECTED Final   Streptococcus pneumoniae NOT DETECTED NOT DETECTED Final   Streptococcus pyogenes NOT DETECTED NOT DETECTED Final   Acinetobacter baumannii NOT DETECTED NOT DETECTED Final   Enterobacteriaceae species NOT DETECTED NOT DETECTED Final   Enterobacter cloacae complex NOT DETECTED NOT DETECTED Final   Escherichia coli NOT DETECTED NOT DETECTED Final   Klebsiella oxytoca NOT DETECTED NOT DETECTED Final   Klebsiella pneumoniae NOT DETECTED NOT DETECTED Final   Proteus species NOT DETECTED NOT DETECTED Final   Serratia marcescens NOT DETECTED NOT DETECTED Final   Haemophilus influenzae NOT DETECTED NOT DETECTED Final   Neisseria meningitidis NOT DETECTED NOT DETECTED Final   Pseudomonas aeruginosa NOT DETECTED NOT DETECTED Final   Candida albicans NOT DETECTED NOT DETECTED Final   Candida glabrata NOT DETECTED NOT DETECTED Final   Candida krusei NOT DETECTED NOT DETECTED Final   Candida parapsilosis NOT DETECTED NOT DETECTED Final   Candida tropicalis NOT DETECTED NOT DETECTED Final    Comment: Performed at [facility], [address] [place], [place] [address]   Radiologic Imaging: Renal US, 08/13/2019: CLINICAL DATA:  Evaluate for hydronephrosis. History of right kidney stones.  EXAM: RENAL / URINARY TRACT ULTRASOUND COMPLETE  COMPARISON:  CT scan June 28, 2019  FINDINGS: Right Kidney:  Renal measurements: 12.8 x 5.9 x 5.2 cm = volume: 206 mL. Contains a 1.8 cm cyst. Mild prominence of the right renal pelvis without caliectasis.  Left Kidney:  Renal measurements: 14.2 x 6.2 x 5.8 cm = volume: 269 mL. Contains a 5  cm cyst. No hydronephrosis.  Bladder:  Appears normal for degree of bladder distention.  Other:  None.  IMPRESSION: 1. Mild prominence of the right renal pelvis without caliectasis. No gross hydronephrosis. 2. Bilateral renal cysts.   Electronically Signed  By: Dorise Bullion III M.D   On: 08/13/2019 12:51  I personally reviewed the imaging above and note the absence of bilateral hydronephrosis.  Assessment & Plan:  74 year old male with recurrent Enterococcus bacteremia with a recent history of right uric acid staghorn stone, s/p staged URS with laser lithotripsy. Given two negative urine cultures, suspect non-urinary source of infection. Agree with ID and cardiology recommendations for TEE.  In the absence of pain, difficulty urinating, sonographic evidence of hydronephrosis, or elevated creatinine, no further urologic imaging is recommended at this time.  If he fails to improve, recommend bladder scan to confirm adequate emptying.  -Start daily cranberry supplements BID for UTI prevention -Start potassium citrate 54mEQ BID for prevention of recurrent uric acid stones  Thank you for involving me in this patient's care, please page with any further questions or concerns.  Debroah Loop, PA-C 08/16/2019 4:25 PM

## 2019-08-16 NOTE — Progress Notes (Signed)
Brief Narrative:  74 y.o. male with medical history significant of hypertension, hyperlipidemia, diabetes mellitus, GERD, OSA, CAD, CABG, kidney stone, aortic valve replacement, hx of Enterococcus sepsis, who presents with positive blood culture.  Pt has hx of Enterococcus sepsis in the past, which is related to kidney stones. He is being followed by ID, Dr. Delaine Lame. His outpatient blood culture 08/11/19 was positive for enterococcus (4/4), therefor he was sent to ED by Dr. Ramon Dredge for further evaluation and treatment.  Patient states that he had fever and chills in Wednesday, Thursday and Friday, but not today.  He had mild right flank pain which has resolved.  He had nausea and vomited twice yesterday, currently no nausea, vomiting, diarrhea or abdominal pain.  Patient has mild dry cough, no have chest pain. Denies symptoms of UTI or hematuria.  No unilateral weakness.  Patient has generalized weakness  Subjective: Patient states he feels some tiredness and fatigue this morning.  Lying on bed.  Objective: Vital signs in last 24 hours: Temp:  [97.5 F (36.4 C)-98.6 F (37 C)] 98.6 F (37 C) (02/02 0543) Pulse Rate:  [88-91] 88 (02/02 0543) Resp:  [20] 20 (02/02 0543) BP: (107-138)/(64-83) 138/83 (02/02 0543) SpO2:  [97 %] 97 % (02/02 0543)  Intake/Output from previous day: 02/01 0701 - 02/02 0700 In: -  Out: 1905 [Urine:1905] Intake/Output this shift: Total I/O In: 2295.5 [I.V.:1534.6; IV Piggyback:760.9] Out: -   General:  Appears tired HEENT:       Eyes: PERRL, EOMI, no scleral icterus.       ENT: No discharge from the ears and nose, no pharynx injection, no tonsillar enlargement.        Neck: No JVD, no bruit, no mass felt. Heme: No neck lymph node enlargement. Cardiac: S1/S2, RRR, No murmurs, No gallops or rubs. Respiratory:  No rales, wheezing, rhonchi or rubs. GI: Soft, nondistended, nontender, no rebound pain, no organomegaly, BS present. GU: No hematuria Ext: No  pitting leg edema bilaterally. 2+DP/PT pulse bilaterally. Musculoskeletal: No joint deformities, No joint redness or warmth, no limitation of ROM in spin. Skin: No rashes.  Neuro: Alert, oriented X3, cranial nerves II-XII grossly intact, moves all extremities normally. Psych: Patient is not psychotic, no suicidal or hemocidal ideation.  Results for orders placed or performed during the hospital encounter of 08/13/19 (from the past 24 hour(s))  Glucose, capillary     Status: Abnormal   Collection Time: 08/15/19  4:56 PM  Result Value Ref Range   Glucose-Capillary 238 (H) 70 - 99 mg/dL   Comment 1 Notify RN   Glucose, capillary     Status: Abnormal   Collection Time: 08/15/19  9:03 PM  Result Value Ref Range   Glucose-Capillary 213 (H) 70 - 99 mg/dL   Comment 1 Notify RN   Creatinine, serum     Status: None   Collection Time: 08/16/19  5:33 AM  Result Value Ref Range   Creatinine, Ser 0.69 0.61 - 1.24 mg/dL   GFR calc non Af Amer >60 >60 mL/min   GFR calc Af Amer >60 >60 mL/min  Glucose, capillary     Status: Abnormal   Collection Time: 08/16/19  7:37 AM  Result Value Ref Range   Glucose-Capillary 155 (H) 70 - 99 mg/dL    Studies/Results: US Renal  Result Date: 08/13/2019 CLINICAL DATA:  Evaluate for hydronephrosis. History of right kidney stones. EXAM: RENAL / URINARY TRACT ULTRASOUND COMPLETE COMPARISON:  CT scan June 28, 2019 FINDINGS: Right Kidney: Renal  measurements: 12.8 x 5.9 x 5.2 cm = volume: 206 mL. Contains a 1.8 cm cyst. Mild prominence of the right renal pelvis without caliectasis. Left Kidney: Renal measurements: 14.2 x 6.2 x 5.8 cm = volume: 269 mL. Contains a 5 cm cyst. No hydronephrosis. Bladder: Appears normal for degree of bladder distention. Other: None. IMPRESSION: 1. Mild prominence of the right renal pelvis without caliectasis. No gross hydronephrosis. 2. Bilateral renal cysts. Electronically Signed   By: Dorise Bullion III M.D   On: 08/13/2019 12:51   DG  Chest Port 1 View  Result Date: 08/13/2019 CLINICAL DATA:  Sepsis.  Fever.  Cough. EXAM: PORTABLE CHEST 1 VIEW COMPARISON:  06/26/2019 FINDINGS: Prior median sternotomy. Midline trachea. Borderline cardiomegaly. Atherosclerosis in the transverse aorta. No pleural effusion or pneumothorax. Clear lungs. IMPRESSION: No acute cardiopulmonary disease. Aortic Atherosclerosis (ICD10-I70.0). Electronically Signed   By: Abigail Miyamoto M.D.   On: 08/13/2019 11:33    Scheduled Meds: . amLODipine  5 mg Oral Daily  . aspirin EC  81 mg Oral Once per day on Mon Wed Fri  . atorvastatin  80 mg Oral q1800  . heparin  5,000 Units Subcutaneous Q8H  . insulin aspart  0-5 Units Subcutaneous QHS  . insulin aspart  0-9 Units Subcutaneous TID WC  . insulin aspart protamine- aspart  60 Units Subcutaneous BID WC  . isosorbide mononitrate  60 mg Oral Daily  . loratadine  10 mg Oral Daily  . metoprolol succinate  25 mg Oral Daily  . pantoprazole  40 mg Oral Daily  . psyllium  1 packet Oral Daily   Continuous Infusions: . sodium chloride Stopped (08/16/19 0850)  . sodium chloride Stopped (08/15/19 0252)  . ampicillin (OMNIPEN) IV 2 g (08/16/19 0850)  . gentamicin 80 mg (08/16/19 1041)   PRN Meds:sodium chloride, acetaminophen, guaiFENesin-dextromethorphan, hydrALAZINE, magnesium hydroxide, nitroGLYCERIN, ondansetron (ZOFRAN) IV, senna-docusate  Assessment/Plan:  Assessment/Plan Principal Problem:   Bacteremia due to Enterococcus Active Problems:   Essential hypertension   Type 2 diabetes mellitus without complication, with long-term current use of insulin (HCC)   Hyperlipidemia LDL goal <70   GERD (gastroesophageal reflux disease)   Coronary artery disease   Bacteremia due to Enterococcus: Continue antibiotic treatment as per ID recommendation  - Per Dr. 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  Currently he has no stent. 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 His blood cultures sent on 08/11/19 as OP was positive for enterococcus (4/4). Dr. Steva Ready recommended to put pt on " IV Amp+ gent combo ( rather than AMP+ ceftriaxone as previously discussedif creatinine okay) .  -Creatinine remains within normal limit; continue to monitor - He will need 6 weeks of Iv antibiotics (and gent will be switched to ceftriaxone later)" - PICC line cannot be placed until he clears the bacteremia per Dr. Delaine Lame -Blood culture ordered on admission on 08/13/2019 again shows enterococci -IV ampicillin and gentamicin   -Urine culture from 08/13/2019 is so far negative -Patient has a history of status post right staghorn calculus lithotripsy and stent in recent past.  -Urology consulted; follow further recs -f/u Bx and Ux negative so far -Follow-up Ultrasound -- Negative for any hydro or acute issues   -IVF continued  HTN: Stable -Continue home medications: Amlodipine, metoprolol -Continue to hold lisinopril -hydralazine prn  Type 2 diabetes mellitus without complication, with long-term current use of insulin ([place]):  Stable  -  most recent A1c 8.0, poorly controled. Patient is taking glipizide, NovoLog, 70/30 insulin at home -Continue 70/30 insulin dose from 90 to 60 units bid -SSI  Hyperlipidemia LDL goal <70 -lipitor  GERD (gastroesophageal reflux disease): -protonix  Coronary artery disease: s/p of CABG. No CP -on ASA, lipitor, Imdur and prn NGT  DVT prophylaxis    LOS: 3 days   Chevon Fomby Izetta Dakin

## 2019-08-17 ENCOUNTER — Encounter
Admission: EM | Disposition: A | Discharge status: Home Health | Payer: Self-pay | Source: Home / Self Care | Attending: Family Medicine

## 2019-08-17 ENCOUNTER — Inpatient Hospital Stay (HOSPITAL_COMMUNITY)
Admit: 2019-08-17 | Discharge: 2019-08-17 | Disposition: A | Discharge status: Home / Self Care | Payer: Medicare HMO | Attending: Physician Assistant | Admitting: Physician Assistant

## 2019-08-17 DIAGNOSIS — I330 Acute and subacute infective endocarditis: Secondary | ICD-10-CM

## 2019-08-17 DIAGNOSIS — I358 Other nonrheumatic aortic valve disorders: Secondary | ICD-10-CM

## 2019-08-17 DIAGNOSIS — R7881 Bacteremia: Secondary | ICD-10-CM

## 2019-08-17 HISTORY — PX: TEE WITHOUT CARDIOVERSION: SHX5443

## 2019-08-17 LAB — GENTAMICIN LEVEL, TROUGH: Gentamicin Trough: <0.5 ug/mL — ABNORMAL LOW (ref 0.5–2.0)

## 2019-08-17 LAB — GLUCOSE, CAPILLARY
Glucose-Capillary: 128 mg/dL — ABNORMAL HIGH (ref 70–99)
Glucose-Capillary: 159 mg/dL — ABNORMAL HIGH (ref 70–99)
Glucose-Capillary: 195 mg/dL — ABNORMAL HIGH (ref 70–99)
Glucose-Capillary: 235 mg/dL — ABNORMAL HIGH (ref 70–99)
Glucose-Capillary: 221 mg/dL — ABNORMAL HIGH (ref 70–99)

## 2019-08-17 SURGERY — ECHOCARDIOGRAM, TRANSESOPHAGEAL
Anesthesia: Moderate Sedation

## 2019-08-17 MED ORDER — FENTANYL CITRATE (PF) 100 MCG/2ML IJ SOLN
INTRAMUSCULAR | Status: AC | PRN
  Administered 2019-08-17: 25 ug via INTRAVENOUS

## 2019-08-17 MED ORDER — MIDAZOLAM HCL 2 MG/2ML IJ SOLN
INTRAMUSCULAR | Status: AC | PRN
  Administered 2019-08-17: 1 mg via INTRAVENOUS

## 2019-08-17 MED ORDER — LIDOCAINE VISCOUS HCL 2 % MT SOLN
OROMUCOSAL | Status: AC
  Filled 2019-08-17: qty 15

## 2019-08-17 MED ORDER — SODIUM CHLORIDE 0.9 % IV SOLN: INTRAVENOUS | Status: DC

## 2019-08-17 MED ORDER — MIDAZOLAM HCL 5 MG/5ML IJ SOLN
INTRAMUSCULAR | Status: AC
  Administered 2019-08-17: 1 mg
  Filled 2019-08-17: qty 5

## 2019-08-17 MED ORDER — SODIUM CHLORIDE 0.9 % IV BOLUS: 250.0000 mL | Freq: Once | INTRAVENOUS | Status: DC

## 2019-08-17 MED ORDER — FENTANYL CITRATE (PF) 100 MCG/2ML IJ SOLN
INTRAMUSCULAR | Status: AC
  Administered 2019-08-17: 25 ug
  Filled 2019-08-17: qty 2

## 2019-08-17 NOTE — Procedures (Signed)
Transesophageal Echocardiogram :  Indication: Bacteremia Requesting/ordering  physician: Marrianne Mood PA-C  Procedure: 44mls of viscous lidocaine were given orally to provide local anesthesia to the oropharynx. The patient was positioned supine on the left side, bite block provided. The patient was moderately sedated with the doses of versed and fentanyl as detailed below.  Using digital technique an omniplane probe was advanced into the esophagus without incident.   Moderate sedation: 1. Sedation used:  Versed: 2, Fentanyl: 75 2. Time administered:  9:27   Time when patient started recovery: 9:41 3. 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 Small mobile mass visualized.  normal LV function with no RWMAs and no mural apical thrombus.  .  Estimated ejection fraction was 60 %.  Right sided cardiac chambers were normal   2D and color flow confirmed no PFO  The LA was well visualized in orthogonal views.  There was no spontaneous contrast and no thrombus in the LA and LA appendage   The descending thoracic aorta had no  mural aortic debris with no evidence of aneurysmal dilation or disection   Aaron Edelman Agbor-Etang 08/17/2019 9:54 AM

## 2019-08-17 NOTE — Progress Notes (Signed)
Brief Narrative:  74 y.o. male with medical history significant of hypertension, hyperlipidemia, diabetes mellitus, GERD, OSA, CAD, CABG, kidney stone, aortic valve replacement, hx of Enterococcus sepsis, who presents with positive blood culture.  Pt has hx of Enterococcus sepsis in the past, which is related to kidney stones. He is being followed by ID, Dr. Delaine Lame. His outpatient blood culture 08/11/19 was positive for enterococcus (4/4), therefor he was sent to ED by Dr. Ramon Dredge for further evaluation and treatment.  Patient states that he had fever and chills in Wednesday, Thursday and Friday, but not today.  He had mild right flank pain which has resolved.  He had nausea and vomited twice yesterday, currently no nausea, vomiting, diarrhea or abdominal pain.  Patient has mild dry cough, no have chest pain. Denies symptoms of UTI or hematuria.  No unilateral weakness.  Patient has generalized weakness  Subjective: No overnight issues, patient was seen after TEE, tolerated procedure well. Pt denied any chest pain or shortness of breath, no abdominal pain, no nausea vomiting or diarrhea.   Objective: Vital signs in last 24 hours: Temp:  [97.7 F (36.5 C)-98.5 F (36.9 C)] 97.7 F (36.5 C) (02/03 1108) Pulse Rate:  [83-102] 83 (02/03 1108) Resp:  [13-21] 18 (02/03 1108) BP: (75-173)/(37-93) 138/71 (02/03 1108) SpO2:  [93 %-100 %] 96 % (02/03 1108)  Intake/Output from previous day: 02/02 0701 - 02/03 0700 In: 3059 [I.V.:2048.1] Out: 1700 [Urine:1700] Intake/Output this shift: Total I/O In: 1513.1 [I.V.:938.1; IV Piggyback:575] Out: -   General:  Appears tired HEENT:       Eyes: PERRL, EOMI, no scleral icterus.       ENT: No discharge from the ears and nose, no pharynx injection, no tonsillar enlargement.        Neck: No JVD, no bruit, no mass felt. Heme: No neck lymph node enlargement. Cardiac: S1/S2, RRR, No murmurs, No gallops or rubs. Respiratory:  No rales, wheezing,  rhonchi or rubs. GI: Soft, nondistended, nontender, no rebound pain, no organomegaly, BS present. GU: No hematuria Ext: No pitting leg edema bilaterally. 2+DP/PT pulse bilaterally. Musculoskeletal: No joint deformities, No joint redness or warmth, no limitation of ROM in spin. Skin: No rashes.  Neuro: Alert, oriented X3, cranial nerves II-XII grossly intact, moves all extremities normally. Psych: Patient is not psychotic, no suicidal or hemocidal ideation.  Results for orders placed or performed during the hospital encounter of 08/13/19 (from the past 24 hour(s))  Glucose, capillary     Status: Abnormal   Collection Time: 08/16/19  8:48 PM  Result Value Ref Range   Glucose-Capillary 185 (H) 70 - 99 mg/dL   Comment 1 Notify RN   Gentamicin level, peak     Status: Abnormal   Collection Time: 08/16/19 10:57 PM  Result Value Ref Range   Gentamicin Pk 4.2 (L) 5.0 - 10.0 ug/mL  Glucose, capillary     Status: Abnormal   Collection Time: 08/17/19  7:58 AM  Result Value Ref Range   Glucose-Capillary 128 (H) 70 - 99 mg/dL  Gentamicin level, trough     Status: Abnormal   Collection Time: 08/17/19 11:25 AM  Result Value Ref Range   Gentamicin Trough <0.5 (L) 0.5 - 2.0 ug/mL  Glucose, capillary     Status: Abnormal   Collection Time: 08/17/19 11:55 AM  Result Value Ref Range   Glucose-Capillary 159 (H) 70 - 99 mg/dL  Glucose, capillary     Status: Abnormal   Collection Time: 08/17/19  4:42 PM  Result Value Ref Range   Glucose-Capillary 195 (H) 70 - 99 mg/dL    Studies/Results: US Renal  Result Date: 08/13/2019 CLINICAL DATA:  Evaluate for hydronephrosis. History of right kidney stones. EXAM: RENAL / URINARY TRACT ULTRASOUND COMPLETE COMPARISON:  CT scan June 28, 2019 FINDINGS: Right Kidney: Renal measurements: 12.8 x 5.9 x 5.2 cm = volume: 206 mL. Contains a 1.8 cm cyst. Mild prominence of the right renal pelvis without caliectasis. Left Kidney: Renal measurements: 14.2 x 6.2 x 5.8 cm  = volume: 269 mL. Contains a 5 cm cyst. No hydronephrosis. Bladder: Appears normal for degree of bladder distention. Other: None. IMPRESSION: 1. Mild prominence of the right renal pelvis without caliectasis. No gross hydronephrosis. 2. Bilateral renal cysts. Electronically Signed   By: Dorise Bullion III M.D   On: 08/13/2019 12:51   DG Chest Port 1 View  Result Date: 08/13/2019 CLINICAL DATA:  Sepsis.  Fever.  Cough. EXAM: PORTABLE CHEST 1 VIEW COMPARISON:  06/26/2019 FINDINGS: Prior median sternotomy. Midline trachea. Borderline cardiomegaly. Atherosclerosis in the transverse aorta. No pleural effusion or pneumothorax. Clear lungs. IMPRESSION: No acute cardiopulmonary disease. Aortic Atherosclerosis (ICD10-I70.0). Electronically Signed   By: Abigail Miyamoto M.D.   On: 08/13/2019 11:33   ECHOCARDIOGRAM COMPLETE  Result Date: 08/16/2019   ECHOCARDIOGRAM REPORT   Patient Name:   Jonathan Cross [facility]. Date of Exam: 08/15/2019 Medical Rec #:  [id number]              Height:       69.0 in Accession #:    [id number]             Weight:       245.0 lb Date of Birth:  [birth year]              BSA:          2.25 m Patient Age:    87 years               BP:           138/78 mmHg Patient Gender: M                      HR:           89 bpm. Exam Location:  [facility] Procedure: 2D Echo, Cardiac Doppler and Color Doppler Indications:     Aortic Valve Disorder 424.1  History:         Patient has no prior history of Echocardiogram examinations.                  CAD; Risk Factors:Hypertension.  Sonographer:     Alyse Low Roar Referring Phys:  [place] Diagnosing Phys: Bartholome Bill MD IMPRESSIONS  1. Left ventricular ejection fraction, by visual estimation, is 60 to 65%. The left ventricle has normal function. Left ventricular septal wall thickness was mildly increased. Mildly increased left ventricular posterior wall thickness. There is mildly increased left ventricular hypertrophy.  2. The left ventricle has no  regional wall motion abnormalities.  3. Global right ventricle has normal systolic function.The right ventricular size is mildly enlarged. No increase in right ventricular wall thickness.  4. Left atrial size was normal.  5. Right atrial size was normal.  6. The mitral valve is grossly normal. Trivial mitral valve regurgitation.  7. The tricuspid valve is grossly normal.  8. The tricuspid valve is grossly normal. Tricuspid valve regurgitation is mild.  9. The aortic valve was not  well visualized. Aortic valve regurgitation is not visualized. Mild to moderate aortic valve sclerosis/calcification without any evidence of aortic stenosis. 10. The pulmonic valve was not well visualized. Pulmonic valve regurgitation is trivial. 11. The aortic root was not well visualized. 12. Mildly elevated pulmonary artery systolic pressure. 13. The interatrial septum was not assessed. FINDINGS  Left Ventricle: Left ventricular ejection fraction, by visual estimation, is 60 to 65%. The left ventricle has normal function. The left ventricle has no regional wall motion abnormalities. Mildly increased left ventricular posterior wall thickness. There is mildly increased left ventricular hypertrophy. Right Ventricle: The right ventricular size is mildly enlarged. No increase in right ventricular wall thickness. Global RV systolic function is has normal systolic function. The tricuspid regurgitant velocity is 2.39 m/s, and with an assumed right atrial  pressure of 10 mmHg, the estimated right ventricular systolic pressure is mildly elevated at 32.8 mmHg. Left Atrium: Left atrial size was normal in size. Right Atrium: Right atrial size was normal in size Pericardium: There is no evidence of pericardial effusion. Mitral Valve: The mitral valve is grossly normal. Trivial mitral valve regurgitation. Tricuspid Valve: The tricuspid valve is grossly normal. Tricuspid valve regurgitation is mild. Aortic Valve: The aortic valve was not well  visualized. Aortic valve regurgitation is not visualized. Mild to moderate aortic valve sclerosis/calcification is present, without any evidence of aortic stenosis. Aortic valve mean gradient measures 15.3 mmHg. Aortic valve peak gradient measures 28.1 mmHg. Aortic valve area, by VTI measures 1.06 cm. Pulmonic Valve: The pulmonic valve was not well visualized. Pulmonic valve regurgitation is trivial. Pulmonic regurgitation is trivial. Aorta: The aortic root was not well visualized. IAS/Shunts: The interatrial septum was not assessed.  LEFT VENTRICLE PLAX 2D LVIDd:         3.99 cm  Diastology LVIDs:         2.71 cm  LV e' lateral:   9.57 cm/s LV PW:         1.25 cm  LV E/e' lateral: 7.9 LV IVS:        1.41 cm  LV e' medial:    5.77 cm/s LVOT diam:     1.70 cm  LV E/e' medial:  13.1 LV SV:         42 ml LV SV Index:   17.86 LVOT Area:     2.27 cm  RIGHT VENTRICLE RV S prime:     12.90 cm/s LEFT ATRIUM             Index       RIGHT ATRIUM           Index LA diam:        4.00 cm 1.78 cm/m  RA Area:     10.30 cm LA Vol (A2C):   42.7 ml 18.96 ml/m RA Volume:   18.40 ml  8.17 ml/m LA Vol (A4C):   52.9 ml 23.49 ml/m LA Biplane Vol: 47.6 ml 21.14 ml/m  AORTIC VALVE                    PULMONIC VALVE AV Area (Vmax):    0.99 cm     PV Vmax:        1.07 m/s AV Area (Vmean):   1.12 cm     PV Peak grad:   4.6 mmHg AV Area (VTI):     1.06 cm     RVOT Peak grad: 3 mmHg AV Vmax:  265.00 cm/s AV Vmean:          180.333 cm/s AV VTI:            0.462 m AV Peak Grad:      28.1 mmHg AV Mean Grad:      15.3 mmHg LVOT Vmax:         116.00 cm/s LVOT Vmean:        89.100 cm/s LVOT VTI:          0.216 m LVOT/AV VTI ratio: 0.47  AORTA Ao Root diam: 3.30 cm MITRAL VALVE                         TRICUSPID VALVE MV Area (PHT): 2.84 cm              TR Peak grad:   22.8 mmHg MV PHT:        77.43 msec            TR Vmax:        243.00 cm/s MV Decel Time: 267 msec MV E velocity: 75.70 cm/s  103 cm/s  SHUNTS MV A velocity: 119.00  cm/s 70.3 cm/s Systemic VTI:  0.22 m MV E/A ratio:  0.64        1.5       Systemic Diam: 1.70 cm  Bartholome Bill MD Electronically signed by Bartholome Bill MD Signature Date/Time: 08/16/2019/4:58:43 PM    Final    ECHO TEE  Result Date: 08/17/2019   TRANSESOPHOGEAL ECHO REPORT   Patient Name:   Braedon Champion [facility]. Date of Exam: 08/17/2019 Medical Rec #:  [id number]              Height:       69.0 in Accession #:    [id number]             Weight:       245.0 lb Date of Birth:  [birth year]              BSA:          2.25 m Patient Age:    35 years               BP:           83/37 mmHg Patient Gender: M                      HR:           93 bpm. Exam Location:  [facility]  Procedure: Transesophageal Echo, Color Doppler and Cardiac Doppler Indications:     Bacteremia 790.7  History:         Patient has prior history of Echocardiogram examinations, most                  recent 08/15/2019. Aortic Valve: A H/O AVR.  Sonographer:     Sherrie Sport RDCS (AE) Referring Phys:  [id number] Arvil Chaco Diagnosing Phys: Kate Sable MD  PROCEDURE: The transesophogeal probe was passed through the esophogus of the patient. The patient developed no complications during the procedure. IMPRESSIONS  1. Left ventricular ejection fraction, by visual estimation, is 60 to 65%. The left ventricle has normal function. There is no left ventricular hypertrophy.  2. The left ventricle has no regional wall motion abnormalities.  3. Global right ventricle has normal systolic function.The right ventricular size is normal. No increase in right ventricular wall thickness.  4.  Left atrial size was not well visualized.  5. Right atrial size was not well visualized.  6. The mitral valve is normal in structure. Trivial mitral valve regurgitation.  7. The tricuspid valve is normal in structure.  8. The tricuspid valve is normal in structure. Tricuspid valve regurgitation is trivial.  9. Aortic valve regurgitation is not visualized. 10. A bioprothetic valve is  noted in the aortic position. There is a 10 x 5 mm mobile mass noted on the aortic valve prosthesis (clips 21-24)consistent with a vegetation in the current clinical context. 11. The pulmonic valve was grossly normal. Pulmonic valve regurgitation is not visualized. FINDINGS  Left Ventricle: Left ventricular ejection fraction, by visual estimation, is 60 to 65%. The left ventricle has normal function. The left ventricle has no regional wall motion abnormalities. The left ventricular internal cavity size was the left ventricle is normal in size. There is no left ventricular hypertrophy. Right Ventricle: The right ventricular size is normal. No increase in right ventricular wall thickness. Global RV systolic function is has normal systolic function. Left Atrium: Left atrial size was not well visualized. Right Atrium: Right atrial size was not well visualized Pericardium: There is no evidence of pericardial effusion. Mitral Valve: The mitral valve is normal in structure. Trivial mitral valve regurgitation. Tricuspid Valve: The tricuspid valve is normal in structure. Tricuspid valve regurgitation is trivial. Aortic Valve: The aortic valve has been repaired/replaced. Aortic valve regurgitation is not visualized. A bioprothetic valve is noted in the aortic position. There is a 10 x 5 mm mobile mass noted on the aortic valve prosthesis (clips 21-24)consistent with a vegetation in the current clinical context. Pulmonic Valve: The pulmonic valve was grossly normal. Pulmonic valve regurgitation is not visualized. Aorta: The aortic root is normal in size and structure. Venous: The inferior vena cava was not well visualized. Shunts: No atrial level shunt detected by color flow Doppler.  Kate Sable MD Electronically signed by Kate Sable MD Signature Date/Time: 08/17/2019/4:51:49 PM    Final     Scheduled Meds: . amLODipine  5 mg Oral Daily  . aspirin EC  81 mg Oral Once per day on Mon Wed Fri  . atorvastatin  80  mg Oral q1800  . heparin  5,000 Units Subcutaneous Q8H  . insulin aspart  0-5 Units Subcutaneous QHS  . insulin aspart  0-9 Units Subcutaneous TID WC  . insulin aspart protamine- aspart  60 Units Subcutaneous BID WC  . isosorbide mononitrate  60 mg Oral Daily  . loratadine  10 mg Oral Daily  . metoprolol succinate  25 mg Oral Daily  . pantoprazole  40 mg Oral Daily  . psyllium  1 packet Oral Daily   Continuous Infusions: . sodium chloride Stopped (08/17/19 0842)  . sodium chloride Stopped (08/15/19 0252)  . ampicillin (OMNIPEN) IV Stopped (08/17/19 1438)  . gentamicin 80 mg (08/17/19 1224)  . sodium chloride     PRN Meds:sodium chloride, acetaminophen, guaiFENesin-dextromethorphan, hydrALAZINE, magnesium hydroxide, nitroGLYCERIN, ondansetron (ZOFRAN) IV, senna-docusate  Assessment/Plan:  Assessment/Plan Principal Problem:   Bacteremia due to Enterococcus Active Problems:   Essential hypertension   Type 2 diabetes mellitus without complication, with long-term current use of insulin (HCC)   Hyperlipidemia LDL goal <70   GERD (gastroesophageal reflux disease)   Coronary artery disease  # Bacteremia, endocarditis, enterococcus bacteremia -TEE with mobile mass c/w vegetation, EF normal, no evidence for heart failure -cont abx as per ID team. - Per Dr. Gwenevere Ghazi note, "Pt has h/o enterococcus  bacteremiaX3secondary to infected renal stone and has undergone multiple procedures and stents . Currently he has no stent. 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 His blood cultures sent on 08/11/19 as OP was positive for enterococcus (4/4). Dr. Steva Ready recommended to put pt on " IV Amp+ gent combo ( rather than AMP+ ceftriaxone as previously discussedif creatinine okay) .  -Creatinine remains within normal limit; continue to monitor - He will need 6 weeks of Iv antibiotics (and gent will be switched to ceftriaxone later)" - PICC line cannot be placed  until he clears the bacteremia per Dr. Delaine Lame -Blood culture ordered on admission on 08/13/2019 again shows enterococci -IV ampicillin and gentamicin   -Urine culture from 08/13/2019 is so far negative -Patient has a history of status post right staghorn calculus lithotripsy and stent in recent past.  -Urology consulted; follow further recs -f/u Bx and Ux negative so far -Follow-up Ultrasound -- Negative for any hydro or acute issues   -IVF continued  HTN: Stable -Continue home medications: Amlodipine, metoprolol -Continue to hold lisinopril -hydralazine prn  Type 2 diabetes mellitus without complication, with long-term current use of insulin ([place]):  Stable  - most recent A1c 8.0, poorly controled. Patient is taking glipizide, NovoLog, 70/30 insulin at home -Continue 70/30 insulin dose from 90 to 60 units bid -SSI  Hyperlipidemia LDL goal <70 -lipitor  GERD (gastroesophageal reflux disease): -protonix  Coronary artery disease: s/p of CABG. No CP -on ASA, lipitor, Imdur and prn NGT  DVT prophylaxis    LOS: 4 days   Val Riles MD

## 2019-08-17 NOTE — Progress Notes (Signed)
ID TEE aortic valve endocarditis He needs Iv ampicillin + gent as long as creatinine holding good and then change to amp+ceftriaxone Blood culture from yesterday if it remains neg tomorrow then can get PICC Need to discuss with his son regardng antibiotics and no alternate regimen to the above is as good as above combo

## 2019-08-17 NOTE — Progress Notes (Signed)
Progress Note  Patient Name: Jonathan Cross. Date of Encounter: 08/17/2019  Primary Cardiologist: Nelva Bush, MD   Subjective   Doing ok, feels tired  Inpatient Medications    Scheduled Meds: . [MAR Hold] amLODipine  5 mg Oral Daily  . [MAR Hold] aspirin EC  81 mg Oral Once per day on Mon Wed Fri  . [MAR Hold] atorvastatin  80 mg Oral q1800  . [MAR Hold] heparin  5,000 Units Subcutaneous Q8H  . [MAR Hold] insulin aspart  0-5 Units Subcutaneous QHS  . [MAR Hold] insulin aspart  0-9 Units Subcutaneous TID WC  . [MAR Hold] insulin aspart protamine- aspart  60 Units Subcutaneous BID WC  . [MAR Hold] isosorbide mononitrate  60 mg Oral Daily  . [MAR Hold] loratadine  10 mg Oral Daily  . [MAR Hold] metoprolol succinate  25 mg Oral Daily  . [MAR Hold] pantoprazole  40 mg Oral Daily  . [MAR Hold] psyllium  1 packet Oral Daily   Continuous Infusions: . sodium chloride 75 mL/hr at 08/17/19 0044  . [MAR Hold] sodium chloride Stopped (08/15/19 0252)  . sodium chloride    . [MAR Hold] ampicillin (OMNIPEN) IV 2 g (08/17/19 0843)  . [MAR Hold] gentamicin 80 mg (08/16/19 2231)  . sodium chloride     PRN Meds: [MAR Hold] sodium chloride, [MAR Hold] acetaminophen, [MAR Hold] guaiFENesin-dextromethorphan, [MAR Hold] hydrALAZINE, [MAR Hold] magnesium hydroxide, [MAR Hold] nitroGLYCERIN, [MAR Hold] ondansetron (ZOFRAN) IV, [MAR Hold] senna-docusate   Vital Signs    Vitals:   08/17/19 0940 08/17/19 0940 08/17/19 0944 08/17/19 1000  BP: (!) 75/39 (!) 75/39 (!) 83/37 127/68  Pulse:  92 93 91  Resp:  18 15 18   Temp:      TempSrc:      SpO2:  93% 96% 98%  Weight:      Height:        Intake/Output Summary (Last 24 hours) at 08/17/2019 1000 Last data filed at 08/17/2019 0501 Gross per 24 hour  Intake 763.49 ml  Output 1700 ml  Net -936.51 ml   Last 3 Weights 08/13/2019 07/21/2019 07/20/2019  Weight (lbs) 245 lb 244 lb 244 lb  Weight (kg) 111.131 kg 110.678 kg 110.678 kg       Telemetry    Sinus rhythm  ECG    Not obtained today - Personally Reviewed  Physical Exam   GEN: No acute distress.   Neck: No JVD Cardiac: RRR, no murmurs, rubs, or gallops.  Respiratory: Clear to auscultation bilaterally. GI: Soft, nontender, non-distended  MS: No edema; No deformity. Neuro:  Nonfocal  Psych: Normal affect   Labs    High Sensitivity Troponin:  No results for input(s): TROPONINIHS in the last 720 hours.    Chemistry Recent Labs  Lab 08/11/19 1414 08/11/19 1414 08/13/19 1100 08/13/19 1100 08/14/19 0536 08/15/19 0536 08/16/19 0533  NA 133*  --  132*  --  138  --   --   K 4.7  --  4.4  --  4.6  --   --   CL 101  --  101  --  106  --   --   CO2 24  --  22  --  23  --   --   GLUCOSE 159*  --  274*  --  162*  --   --   BUN 17  --  20  --  13  --   --   CREATININE 1.08   < >  1.12   < > 0.74 0.78 0.69  CALCIUM 9.5  --  9.0  --  8.7*  --   --   PROT 7.5  --  7.4  --   --   --   --   ALBUMIN 3.9  --  3.6  --   --   --   --   AST 33  --  28  --   --   --   --   ALT 40  --  33  --   --   --   --   ALKPHOS 48  --  58  --   --   --   --   BILITOT 0.8  --  1.0  --   --   --   --   GFRNONAA >60   < > >60   < > >60 >60 >60  GFRAA >60   < > >60   < > >60 >60 >60  ANIONGAP 8  --  9  --  9  --   --    < > = values in this interval not displayed.     Hematology Recent Labs  Lab 08/11/19 1414 08/13/19 1100 08/14/19 0536  WBC 4.6 4.3 4.3  RBC 3.33* 3.19* 2.93*  HGB 11.6* 11.0* 10.3*  HCT 34.9* 33.0* 30.4*  MCV 104.8* 103.4* 103.8*  MCH 34.8* 34.5* 35.2*  MCHC 33.2 33.3 33.9  RDW 15.5 15.5 15.3  PLT 129* 128* 123*    BNPNo results for input(s): BNP, PROBNP in the last 168 hours.   DDimer No results for input(s): DDIMER in the last 168 hours.   Radiology    ECHOCARDIOGRAM COMPLETE  Result Date: 08/16/2019   ECHOCARDIOGRAM REPORT   Patient Name:   Jonathan Cross. Date of Exam: 08/15/2019 Medical Rec #:  [id number]              Height:        69.0 in Accession #:    [id number]             Weight:       245.0 lb Date of Birth:  1946-04-11              BSA:          2.25 m Patient Age:    74 years               BP:           138/78 mmHg Patient Gender: M                      HR:           89 bpm. Exam Location:  [facility] Procedure: 2D Echo, Cardiac Doppler and Color Doppler Indications:     Aortic Valve Disorder 424.1  History:         Patient has no prior history of Echocardiogram examinations.                  CAD; Risk Factors:Hypertension.  Sonographer:     Jonathan Cross Referring Phys:  [place] Diagnosing Phys: Jonathan Bill MD IMPRESSIONS  1. Left ventricular ejection fraction, by visual estimation, is 60 to 65%. The left ventricle has normal function. Left ventricular septal wall thickness was mildly increased. Mildly increased left ventricular posterior wall thickness. There is mildly increased left ventricular hypertrophy.  2. The left ventricle has no regional wall motion  abnormalities.  3. Global right ventricle has normal systolic function.The right ventricular size is mildly enlarged. No increase in right ventricular wall thickness.  4. Left atrial size was normal.  5. Right atrial size was normal.  6. The mitral valve is grossly normal. Trivial mitral valve regurgitation.  7. The tricuspid valve is grossly normal.  8. The tricuspid valve is grossly normal. Tricuspid valve regurgitation is mild.  9. The aortic valve was not well visualized. Aortic valve regurgitation is not visualized. Mild to moderate aortic valve sclerosis/calcification without any evidence of aortic stenosis. 10. The pulmonic valve was not well visualized. Pulmonic valve regurgitation is trivial. 11. The aortic root was not well visualized. 12. Mildly elevated pulmonary artery systolic pressure. 13. The interatrial septum was not assessed. FINDINGS  Left Ventricle: Left ventricular ejection fraction, by visual estimation, is 60 to 65%. The left ventricle has  normal function. The left ventricle has no regional wall motion abnormalities. Mildly increased left ventricular posterior wall thickness. There is mildly increased left ventricular hypertrophy. Right Ventricle: The right ventricular size is mildly enlarged. No increase in right ventricular wall thickness. Global RV systolic function is has normal systolic function. The tricuspid regurgitant velocity is 2.39 m/s, and with an assumed right atrial  pressure of 10 mmHg, the estimated right ventricular systolic pressure is mildly elevated at 32.8 mmHg. Left Atrium: Left atrial size was normal in size. Right Atrium: Right atrial size was normal in size Pericardium: There is no evidence of pericardial effusion. Mitral Valve: The mitral valve is grossly normal. Trivial mitral valve regurgitation. Tricuspid Valve: The tricuspid valve is grossly normal. Tricuspid valve regurgitation is mild. Aortic Valve: The aortic valve was not well visualized. Aortic valve regurgitation is not visualized. Mild to moderate aortic valve sclerosis/calcification is present, without any evidence of aortic stenosis. Aortic valve mean gradient measures 15.3 mmHg. Aortic valve peak gradient measures 28.1 mmHg. Aortic valve area, by VTI measures 1.06 cm. Pulmonic Valve: The pulmonic valve was not well visualized. Pulmonic valve regurgitation is trivial. Pulmonic regurgitation is trivial. Aorta: The aortic root was not well visualized. IAS/Shunts: The interatrial septum was not assessed.  LEFT VENTRICLE PLAX 2D LVIDd:         3.99 cm  Diastology LVIDs:         2.71 cm  LV e' lateral:   9.57 cm/s LV PW:         1.25 cm  LV E/e' lateral: 7.9 LV IVS:        1.41 cm  LV e' medial:    5.77 cm/s LVOT diam:     1.70 cm  LV E/e' medial:  13.1 LV SV:         42 ml LV SV Index:   17.86 LVOT Area:     2.27 cm  RIGHT VENTRICLE RV S prime:     12.90 cm/s LEFT ATRIUM             Index       RIGHT ATRIUM           Index LA diam:        4.00 cm 1.78 cm/m  RA  Area:     10.30 cm LA Vol (A2C):   42.7 ml 18.96 ml/m RA Volume:   18.40 ml  8.17 ml/m LA Vol (A4C):   52.9 ml 23.49 ml/m LA Biplane Vol: 47.6 ml 21.14 ml/m  AORTIC VALVE  PULMONIC VALVE AV Area (Vmax):    0.99 cm     PV Vmax:        1.07 m/s AV Area (Vmean):   1.12 cm     PV Peak grad:   4.6 mmHg AV Area (VTI):     1.06 cm     RVOT Peak grad: 3 mmHg AV Vmax:           265.00 cm/s AV Vmean:          180.333 cm/s AV VTI:            0.462 m AV Peak Grad:      28.1 mmHg AV Mean Grad:      15.3 mmHg LVOT Vmax:         116.00 cm/s LVOT Vmean:        89.100 cm/s LVOT VTI:          0.216 m LVOT/AV VTI ratio: 0.47  AORTA Ao Root diam: 3.30 cm MITRAL VALVE                         TRICUSPID VALVE MV Area (PHT): 2.84 cm              TR Peak grad:   22.8 mmHg MV PHT:        77.43 msec            TR Vmax:        243.00 cm/s MV Decel Time: 267 msec MV E velocity: 75.70 cm/s  103 cm/s  SHUNTS MV A velocity: 119.00 cm/s 70.3 cm/s Systemic VTI:  0.22 m MV E/A ratio:  0.64        1.5       Systemic Diam: 1.70 cm  Jonathan Bill MD Electronically signed by Jonathan Bill MD Signature Date/Time: 08/16/2019/4:58:43 PM    Final    ECHO TEE  Result Date: 08/17/2019 Kate Sable, MD     08/17/2019  9:58 AM Transesophageal Echocardiogram : Indication: Bacteremia Requesting/ordering  physician: Marrianne Mood PA-C Procedure: 38mls of viscous lidocaine were given orally to provide local anesthesia to the oropharynx. The patient was positioned supine on the left side, bite block provided. The patient was moderately sedated with the doses of versed and fentanyl as detailed below.  Using digital technique an omniplane probe was advanced into the esophagus without incident. Moderate sedation: 1. Sedation used:  Versed: 2, Fentanyl: 75 2. Time administered:  9:27   Time when patient started recovery: 9:41 3. 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 Small mobile mass visualized. normal LV function with no RWMAs and no mural apical thrombus.  .  Estimated ejection fraction was 60 %.  Right sided cardiac chambers were normal 2D and color flow confirmed no PFO The LA was well visualized in orthogonal views.  There was no spontaneous contrast and no thrombus in the LA and LA appendage The descending thoracic aorta had no  mural aortic debris with no evidence of aneurysmal dilation or disection Aaron Edelman Agbor-Etang 08/17/2019 9:54 AM    Cardiac Studies   TTE 08/2019 1. Left ventricular ejection fraction, by visual estimation, is 60 to  65%. The left ventricle has normal function. Left ventricular septal wall  thickness was mildly increased. Mildly increased left ventricular  posterior wall thickness. There is mildly  increased  left ventricular hypertrophy.  2. The left ventricle has no regional wall motion abnormalities.  3. Global right ventricle has normal systolic function.The right  ventricular size is mildly enlarged. No increase in right ventricular wall  thickness.  4. Left atrial size was normal.  5. Right atrial size was normal.  6. The mitral valve is grossly normal. Trivial mitral valve  regurgitation.  7. The tricuspid valve is grossly normal.  8. The tricuspid valve is grossly normal. Tricuspid valve regurgitation  is mild.  9. The aortic valve was not well visualized. Aortic valve regurgitation  is not visualized. Mild to moderate aortic valve sclerosis/calcification  without any evidence of aortic stenosis.  10. The pulmonic valve was not well visualized. Pulmonic valve  regurgitation is trivial.  11. The aortic root was not well visualized.  12. Mildly elevated pulmonary artery systolic pressure.  13. The interatrial septum was not assessed.   Patient Profile     74 y.o. male with hx of CABG, AS s/p bioprosthetic valve 2017 at [facility], htn,  dm, being seen due to bacteremia,  Assessment & Plan    1. Bacteremia, endocarditis -enterococcus bacteremia -TEE with mobile mass c/w vegetation in current clinical context -cont abx as per ID team. Will need a prolonged course to see if infection clears out. -EF normal, no evidence for heart failure  2. Hx of CAD/CABG -cont asa, imdur, bb. lipitor -no ischemic work up planned  3. AS s/p bioprosthetic valve -mobile mass noted on bioprosthesis c/w endocarditis -no rocking or regurgitation noted -cont abx  4. HLD -cont statin      Signed, Kate Sable, MD  08/17/2019, 10:00 AM

## 2019-08-17 NOTE — Consult Note (Signed)
Pharmacy Antibiotic Note  Jonathan Cross Jonathan Cross. is a 74 y.o. male admitted on 08/13/2019 with bacteremia.  Pharmacy has been consulted for ampicillin and gentamicin dosing. Blood cultures from 1/28 have been reported 4/4 Enterococcus with a h/o enterococcal bacteremia. Based on ID recommendations we started ampicillin + gentamicin for synergy  gentamicin steady-state levels: 2/2 2257 4.2 mcg/mL (dose - 80mg  at 22:31) 2/3 1125 <0.5 mcg/mL  Calculated Pk Parameters  T1/2: 3.87h Ke: 0.1792 h-1  Plan: 1) continue ampicillin 2 grams IV every 4 hours  2) Will continue current gentamicin dose of 80mg  IV q12h based on age and levels (note peak is difficult to interpret as it was drawn while the medication was still infusing thus did not allow for distribution of drug)   Follow renal function closely: SCr in am  Gentamicin peak and trough will be ordered in 2-3 days of current regimen  Goal gentamicin levels:   Peak: 3 - 4 mg/L  Trough: <1 mg/L  Height: 5\' 9"  (175.3 cm) Weight: 245 lb (111.1 kg) IBW/kg (Calculated) : 70.7  Temp (24hrs), Avg:98.2 F (36.8 C), Min:97.7 F (36.5 C), Max:98.5 F (36.9 C)  Recent Labs  Lab 08/11/19 1414 08/13/19 1100 08/14/19 0536 08/14/19 1128 08/15/19 0536 08/16/19 0533 08/16/19 2257 08/17/19 1125  WBC 4.6 4.3 4.3  --   --   --   --   --   CREATININE 1.08 1.12 0.74  --  0.78 0.69  --   --   LATICACIDVEN  --  1.7  2.1*  --   --   --   --   --   --   GENTTROUGH  --   --   --  1.4  --   --   --  <0.5*  GENTPEAK  --   --  8.5  --   --   --  4.2*  --     Estimated Creatinine Clearance: 98.1 mL/min (by C-G formula based on SCr of 0.69 mg/dL).    Antimicrobials this admission: 1/30 ampicillin >>  1/30 gentamicin >>   Microbiology results: 1/28 BCx: Enterococcus sp 4/4 (amp susc) 1/28 UCx: pending   Thank you for allowing pharmacy to be a part of this patient's care.  Pearla Dubonnet, PharmD Clinical Pharmacist 08/17/2019 2:32 PM

## 2019-08-17 NOTE — Progress Notes (Signed)
*  PRELIMINARY RESULTS* Echocardiogram Echocardiogram Transesophageal has been performed.  Sherrie Sport 08/17/2019, 10:00 AM

## 2019-08-18 ENCOUNTER — Inpatient Hospital Stay: Payer: Self-pay

## 2019-08-18 DIAGNOSIS — A419 Sepsis, unspecified organism: Secondary | ICD-10-CM

## 2019-08-18 LAB — BASIC METABOLIC PANEL
Anion gap: 9 (ref 5–15)
BUN: 10 mg/dL (ref 8–23)
CO2: 23 mmol/L (ref 22–32)
Calcium: 8.6 mg/dL — ABNORMAL LOW (ref 8.9–10.3)
Chloride: 107 mmol/L (ref 98–111)
Creatinine, Ser: 0.77 mg/dL (ref 0.61–1.24)
GFR calc Af Amer: >60 mL/min (ref >60)
GFR calc non Af Amer: >60 mL/min (ref >60)
Glucose, Bld: 170 mg/dL — ABNORMAL HIGH (ref 70–99)
Potassium: 3.7 mmol/L (ref 3.5–5.1)
Sodium: 139 mmol/L (ref 135–145)

## 2019-08-18 LAB — CBC
HCT: 31.2 % — ABNORMAL LOW (ref 39.0–52.0)
Hemoglobin: 10.3 g/dL — ABNORMAL LOW (ref 13.0–17.0)
MCH: 34.6 pg — ABNORMAL HIGH (ref 26.0–34.0)
MCHC: 33 g/dL (ref 30.0–36.0)
MCV: 104.7 fL — ABNORMAL HIGH (ref 80.0–100.0)
Platelets: 175 10*3/uL (ref 150–400)
RBC: 2.98 MIL/uL — ABNORMAL LOW (ref 4.22–5.81)
RDW: 15 % (ref 11.5–15.5)
WBC: 6.5 10*3/uL (ref 4.0–10.5)
nRBC: 0 % (ref 0.0–0.2)

## 2019-08-18 LAB — PHOSPHORUS: Phosphorus: 2.6 mg/dL (ref 2.5–4.6)

## 2019-08-18 LAB — GLUCOSE, CAPILLARY
Glucose-Capillary: 144 mg/dL — ABNORMAL HIGH (ref 70–99)
Glucose-Capillary: 233 mg/dL — ABNORMAL HIGH (ref 70–99)
Glucose-Capillary: 160 mg/dL — ABNORMAL HIGH (ref 70–99)

## 2019-08-18 LAB — MAGNESIUM: Magnesium: 1.7 mg/dL (ref 1.7–2.4)

## 2019-08-18 MED ORDER — CHLORHEXIDINE GLUCONATE CLOTH 2 % EX PADS: 6.0000 | MEDICATED_PAD | Freq: Every day | CUTANEOUS | Status: DC

## 2019-08-18 MED ORDER — SODIUM CHLORIDE 0.9% FLUSH: 10.0000 mL | Freq: Two times a day (BID) | INTRAVENOUS | Status: DC

## 2019-08-18 MED ORDER — CEFTRIAXONE IV (FOR PTA / DISCHARGE USE ONLY): 2.0000 g | Freq: Two times a day (BID) | INTRAVENOUS | 0 refills | Status: AC

## 2019-08-18 MED ORDER — SODIUM CHLORIDE 0.9% FLUSH: 10.0000 mL | INTRAVENOUS | Status: DC | PRN

## 2019-08-18 MED ORDER — SODIUM CHLORIDE 0.9 % IV SOLN
2.0000 g | Freq: Two times a day (BID) | INTRAVENOUS | Status: DC
  Administered 2019-08-18: 2 g via INTRAVENOUS
  Filled 2019-08-18: qty 2
  Filled 2019-08-18: qty 20

## 2019-08-18 MED ORDER — SODIUM CHLORIDE 0.9 % IV SOLN
2.0000 g | Freq: Two times a day (BID) | INTRAVENOUS | Status: DC
  Filled 2019-08-18 (×2): qty 20

## 2019-08-18 MED ORDER — AMPICILLIN IV (FOR PTA / DISCHARGE USE ONLY): 12.0000 g | INTRAVENOUS | 0 refills | Status: AC

## 2019-08-18 NOTE — Progress Notes (Signed)
Progress Note  Patient Name: Jonathan Cross. Date of Encounter: 08/18/2019  Primary Cardiologist: Nelva Bush, MD   Subjective   Reports feeling relatively well, plan for central line for long-term antibiotics Denies any fever, shortness of breath Results from TEE yesterday discussed with him showing mobile vegetation on prosthetic valve  Inpatient Medications    Scheduled Meds: . amLODipine  5 mg Oral Daily  . aspirin EC  81 mg Oral Once per day on Mon Wed Fri  . atorvastatin  80 mg Oral q1800  . heparin  5,000 Units Subcutaneous Q8H  . insulin aspart  0-5 Units Subcutaneous QHS  . insulin aspart  0-9 Units Subcutaneous TID WC  . insulin aspart protamine- aspart  60 Units Subcutaneous BID WC  . isosorbide mononitrate  60 mg Oral Daily  . loratadine  10 mg Oral Daily  . metoprolol succinate  25 mg Oral Daily  . pantoprazole  40 mg Oral Daily  . psyllium  1 packet Oral Daily   Continuous Infusions: . sodium chloride Stopped (08/17/19 0842)  . sodium chloride Stopped (08/15/19 0252)  . ampicillin (OMNIPEN) IV 2 g (08/18/19 1517)  . cefTRIAXone (ROCEPHIN)  IV    . sodium chloride     PRN Meds: sodium chloride, acetaminophen, guaiFENesin-dextromethorphan, hydrALAZINE, magnesium hydroxide, nitroGLYCERIN, ondansetron (ZOFRAN) IV, senna-docusate   Vital Signs    Vitals:   08/17/19 1108 08/17/19 2035 08/18/19 0435 08/18/19 1227  BP: 138/71 (!) 150/73 130/71 135/81  Pulse: 83 99 93 91  Resp: 18 20 20 20   Temp: 97.7 F (36.5 C) 97.9 F (36.6 C) 97.9 F (36.6 C) 97.9 F (36.6 C)  TempSrc: Oral Oral Oral Oral  SpO2: 96% 98% 96% 99%  Weight:      Height:        Intake/Output Summary (Last 24 hours) at 08/18/2019 1609 Last data filed at 08/18/2019 1230 Gross per 24 hour  Intake 1460.67 ml  Output 1825 ml  Net -364.33 ml   Last 3 Weights 08/13/2019 07/21/2019 07/20/2019  Weight (lbs) 245 lb 244 lb 244 lb  Weight (kg) 111.131 kg 110.678 kg 110.678 kg       Telemetry    Sinus rhythm  ECG    Not obtained today - Personally Reviewed  Physical Exam   Constitutional:  oriented to person, place, and time. No distress.  HENT:  Head: Grossly normal Eyes:  no discharge. No scleral icterus.  Neck: No JVD, no carotid bruits  Cardiovascular: Regular rate and rhythm, no murmurs appreciated Pulmonary/Chest: Clear to auscultation bilaterally, no wheezes or rails Abdominal: Soft.  no distension.  no tenderness.  Musculoskeletal: Normal range of motion Neurological:  normal muscle tone. Coordination normal. No atrophy Skin: Skin warm and dry Psychiatric: normal affect, pleasant   Labs    High Sensitivity Troponin:  No results for input(s): TROPONINIHS in the last 720 hours.    Chemistry Recent Labs  Lab 08/13/19 1100 08/13/19 1100 08/14/19 0536 08/14/19 0536 08/15/19 0536 08/16/19 0533 08/18/19 0448  NA 132*  --  138  --   --   --  139  K 4.4  --  4.6  --   --   --  3.7  CL 101  --  106  --   --   --  107  CO2 22  --  23  --   --   --  23  GLUCOSE 274*  --  162*  --   --   --  170*  BUN 20  --  13  --   --   --  10  CREATININE 1.12   < > 0.74   < > 0.78 0.69 0.77  CALCIUM 9.0  --  8.7*  --   --   --  8.6*  PROT 7.4  --   --   --   --   --   --   ALBUMIN 3.6  --   --   --   --   --   --   AST 28  --   --   --   --   --   --   ALT 33  --   --   --   --   --   --   ALKPHOS 58  --   --   --   --   --   --   BILITOT 1.0  --   --   --   --   --   --   GFRNONAA >60   < > >60   < > >60 >60 >60  GFRAA >60   < > >60   < > >60 >60 >60  ANIONGAP 9  --  9  --   --   --  9   < > = values in this interval not displayed.     Hematology Recent Labs  Lab 08/13/19 1100 08/14/19 0536 08/18/19 0448  WBC 4.3 4.3 6.5  RBC 3.19* 2.93* 2.98*  HGB 11.0* 10.3* 10.3*  HCT 33.0* 30.4* 31.2*  MCV 103.4* 103.8* 104.7*  MCH 34.5* 35.2* 34.6*  MCHC 33.3 33.9 33.0  RDW 15.5 15.3 15.0  PLT 128* 123* 175    BNPNo results for input(s): BNP,  PROBNP in the last 168 hours.   DDimer No results for input(s): DDIMER in the last 168 hours.   Radiology    ECHO TEE  Result Date: 08/17/2019   TRANSESOPHOGEAL ECHO REPORT   Patient Name:   Mykol Goytia [facility]. Date of Exam: 08/17/2019 Medical Rec #:  [id number]              Height:       69.0 in Accession #:    [id number]             Weight:       245.0 lb Date of Birth:  1946-04-26              BSA:          2.25 m Patient Age:    5 years               BP:           83/37 mmHg Patient Gender: M                      HR:           93 bpm. Exam Location:  [facility]  Procedure: Transesophageal Echo, Color Doppler and Cardiac Doppler Indications:     Bacteremia 790.7  History:         Patient has prior history of Echocardiogram examinations, most                  recent 08/15/2019. Aortic Valve: A H/O AVR.  Sonographer:     Sherrie Sport RDCS (AE) Referring Phys:  [id number] Arvil [place] Diagnosing Phys: Kate Sable MD  PROCEDURE: The transesophogeal probe was passed through  the esophogus of the patient. The patient developed no complications during the procedure. IMPRESSIONS  1. Left ventricular ejection fraction, by visual estimation, is 60 to 65%. The left ventricle has normal function. There is no left ventricular hypertrophy.  2. The left ventricle has no regional wall motion abnormalities.  3. Global right ventricle has normal systolic function.The right ventricular size is normal. No increase in right ventricular wall thickness.  4. Left atrial size was not well visualized.  5. Right atrial size was not well visualized.  6. The mitral valve is normal in structure. Trivial mitral valve regurgitation.  7. The tricuspid valve is normal in structure.  8. The tricuspid valve is normal in structure. Tricuspid valve regurgitation is trivial.  9. Aortic valve regurgitation is not visualized. 10. A bioprothetic valve is noted in the aortic position. There is a 10 x 5 mm mobile mass noted on the aortic valve  prosthesis (clips 21-24)consistent with a vegetation in the current clinical context. 11. The pulmonic valve was grossly normal. Pulmonic valve regurgitation is not visualized. FINDINGS  Left Ventricle: Left ventricular ejection fraction, by visual estimation, is 60 to 65%. The left ventricle has normal function. The left ventricle has no regional wall motion abnormalities. The left ventricular internal cavity size was the left ventricle is normal in size. There is no left ventricular hypertrophy. Right Ventricle: The right ventricular size is normal. No increase in right ventricular wall thickness. Global RV systolic function is has normal systolic function. Left Atrium: Left atrial size was not well visualized. Right Atrium: Right atrial size was not well visualized Pericardium: There is no evidence of pericardial effusion. Mitral Valve: The mitral valve is normal in structure. Trivial mitral valve regurgitation. Tricuspid Valve: The tricuspid valve is normal in structure. Tricuspid valve regurgitation is trivial. Aortic Valve: The aortic valve has been repaired/replaced. Aortic valve regurgitation is not visualized. A bioprothetic valve is noted in the aortic position. There is a 10 x 5 mm mobile mass noted on the aortic valve prosthesis (clips 21-24)consistent with a vegetation in the current clinical context. Pulmonic Valve: The pulmonic valve was grossly normal. Pulmonic valve regurgitation is not visualized. Aorta: The aortic root is normal in size and structure. Venous: The inferior vena cava was not well visualized. Shunts: No atrial level shunt detected by color flow Doppler.  Kate Sable MD Electronically signed by Kate Sable MD Signature Date/Time: 08/17/2019/4:51:49 PM    Final    [place] EKG SITE RITE  Result Date: 08/18/2019 If Site Rite image not attached, placement could not be confirmed due to current cardiac rhythm.   Cardiac Studies   TTE 08/2019 1. Left ventricular ejection  fraction, by visual estimation, is 60 to  65%. The left ventricle has normal function. Left ventricular septal wall  thickness was mildly increased. Mildly increased left ventricular  posterior wall thickness. There is mildly  increased left ventricular hypertrophy.  2. The left ventricle has no regional wall motion abnormalities.  3. Global right ventricle has normal systolic function.The right  ventricular size is mildly enlarged. No increase in right ventricular wall  thickness.  4. Left atrial size was normal.  5. Right atrial size was normal.  6. The mitral valve is grossly normal. Trivial mitral valve  regurgitation.  7. The tricuspid valve is grossly normal.  8. The tricuspid valve is grossly normal. Tricuspid valve regurgitation  is mild.  9. The aortic valve was not well visualized. Aortic valve regurgitation  is not visualized. Mild to  moderate aortic valve sclerosis/calcification  without any evidence of aortic stenosis.  10. The pulmonic valve was not well visualized. Pulmonic valve  regurgitation is trivial.  11. The aortic root was not well visualized.  12. Mildly elevated pulmonary artery systolic pressure.  13. The interatrial septum was not assessed.   Patient Profile     74 y.o. male with hx of CABG, AS s/p bioprosthetic valve 2017 at [facility], htn, dm, being seen due to bacteremia,  Assessment & Plan    1. Bacteremia, endocarditis -enterococcus bacteremia on cultures -TEE with mobile mass c/w vegetation, mobile mass on prosthetic aortic valve -EF normal, no evidence for heart failure -Reports feeling improved on antibiotics Plan for central line, long-term antibiotics per ID  2. Hx of CAD/CABG -no ischemic work up planned Would continue current medications On beta-blocker, statin, Imdur  3. AS s/p bioprosthetic valve Vegetation as above  4. HLD -cont statin Goal LDL less than 70   Total encounter time more than 25 minutes  Greater than 50% was  spent in counseling and coordination of care with the patient       Signed, Ida Rogue, MD  08/18/2019, 4:09 PM

## 2019-08-18 NOTE — Progress Notes (Signed)
Peripherally Inserted Central Catheter/Midline Placement  The IV Nurse has discussed with the patient and/or persons authorized to consent for the patient, the purpose of this procedure and the potential benefits and risks involved with this procedure.  The benefits include less needle sticks, lab draws from the catheter, and the patient may be discharged home with the catheter. Risks include, but not limited to, infection, bleeding, blood clot (thrombus formation), and puncture of an artery; nerve damage and irregular heartbeat and possibility to perform a PICC exchange if needed/ordered by physician.  Alternatives to this procedure were also discussed.  Bard Power PICC patient education guide, fact sheet on infection prevention and patient information card has been provided to patient /or left at bedside.    PICC/Midline Placement Documentation  PICC Single Lumen [id number] PICC Right Basilic 42 cm 0 cm (Active)  Indication for Insertion or Continuance of Line Home intravenous therapies (PICC only) 08/18/19 1625  Exposed Catheter (cm) 0 cm 08/18/19 1625  Site Assessment Clean;Dry;Intact 08/18/19 1625  Line Status Flushed;Saline locked;Blood return noted 08/18/19 1625  Dressing Type Transparent 08/18/19 1625  Dressing Status Clean;Dry;Intact 08/18/19 1625  Dressing Intervention New dressing 08/18/19 1625  Dressing Change Due 08/25/19 08/18/19 1625       Gisele Pack, Nicolette Bang 08/18/2019, 4:26 PM

## 2019-08-18 NOTE — Treatment Plan (Signed)
Diagnosis: Enterococcus bacteremia and prosthetic  valve endocarditis Baseline Creatinine  < 1   Allergies  Allergen Reactions  . Metformin Diarrhea    Loose stools even with XR    OPAT Orders Discharge antibiotics: Ampicillin 2 grams every 4 hours will be given thru an infusion pump as a continuous infusion 12 grams/24 hours  And ceftriaxone 2 grams IV every 12 hours  End Date: 09/27/19 Will need Po ampicillin after that   [facility] Per Protocol:  Labs weekly on Monday  while on IV antibiotics: __X CBC with differential _ __X CMP  ESR once in 2 weeks on  A monday  _X_ Please pull PIC at completion of IV antibiotics _ Fax weekly labs to [facility] promptly at [phone]  Clinic Follow Up Appt:in 3 weeks    Call [phone] with any questions.Marland Kitchen

## 2019-08-18 NOTE — TOC Transition Note (Signed)
Transition of Care [facility]) - CM/SW Discharge Note   Patient Details  Name: Jonathan Cross. MRN: [id number] Date of Birth: [birth year]  [facility]) CM/SW Contact:  Candie Chroman, LCSW Phone Number: 08/18/2019, 3:40 PM   Clinical Narrative: Patient has orders to discharge home today after PICC placed and abx teaching complete. Alvis Lemmings is aware. No further concerns. CSW signing off.    Final next level of care: [place] Barriers to Discharge: Barriers Resolved   Patient Goals and CMS Choice     Choice offered to / list presented to : Patient  Discharge Placement                    Patient and family notified of of transfer: 08/18/19  Discharge Plan and Services                          HH Arranged: RN, IV Antibiotics [place]: [place] [facility]) Date [place]: 08/18/19   Representative spoke with at [place]: Alvis Lemmings: Adela Lank, [facility]: Carolynn Sayers  Social Determinants of Health (SDOH) Interventions     Readmission Risk Interventions Readmission Risk Prevention Plan 08/16/2019 06/30/2019  Transportation Screening Complete Complete  PCP or Specialist Appt within 3-5 Days Complete -  HRI or [place] - Patient refused  Social Work Consult for [place] Planning/Counseling - Complete  Palliative Care Screening Not Applicable Not Applicable  Medication Review Press photographer) Complete Complete  Some recent data might be hidden

## 2019-08-18 NOTE — Discharge Summary (Signed)
[facility] Discharge Summary   Patient: Jonathan Cross. PPJ:[id number]  PCP: Georges Mouse, MD  Date of admission: 08/13/2019   Date of discharge:  08/18/2019     Discharge Diagnoses:  Principal Problem:   Bacteremia due to Enterococcus Active Problems:   Essential hypertension   Type 2 diabetes mellitus without complication, with long-term current use of insulin (HCC)   Hyperlipidemia LDL goal <70   GERD (gastroesophageal reflux disease)   Coronary artery disease   Acute bacterial endocarditis   Admitted From: Home Disposition:  Home with RN for help with IV infusion at home  Recommendations for Outpatient Follow-up:  1. PCP in 1 week, continue to monitor BP at home and titrate medications accordingly.  If blood pressure remains low then call PCP immediately 2. Follow up ID as an outpatient to complete antibiotics and blood work 3. Follow-up with cardiologist as per schedule to repeat echocardiogram after finishing antibiotics.  Follow-up Information    Croglio, Guadalupe Dawn, MD Follow up.   Specialty: Internal Medicine Why: Please schedule hospital follow up appt within 5-7 days o discharge. Contact information: [address] [place] [address] [phone]        Nelva Bush, MD .   Specialty: Cardiology Contact information: [place] [address] [phone]          Diet recommendation: Carb modified diet  Activity: The patient is advised to gradually reintroduce usual activities, as tolerated  Discharge Condition: stable  Code Status: Full code   History of present illness: As per the H and P dictated on admission,  74 y.o.malewith medical history significant ofhypertension, hyperlipidemia, diabetes mellitus, GERD, OSA, CAD, CABG, kidney stone, aortic valve replacement,hx ofEnterococcus sepsis,who presents withpositive blood culture.  Pt has hx ofEnterococcus sepsis in the past,which  isrelated to kidney stones. He is being followed by ID, Dr.Ravishankar. His outpatient blood culture 1/28/21waspositive for enterococcus (4/4), therefor he was sent toED by Dr. Ramon Dredge for further evaluation and treatment. Patient states that he had fever and chills in Wednesday, Thursday and Friday, but not today. He had mild right flank pain which has resolved. He had nausea and vomited twice yesterday, currently no nausea,vomiting, diarrhea or abdominal pain. Patient has mild dry cough, nohave chest pain.Denies symptoms of UTI or hematuria. No unilateral weakness. Patient has generalized weakness  Hospital Course:  Principal Problem: Bacteremia due to Enterococcus Active Problems: Essential hypertension Type 2 diabetes mellitus without complication, with long-term current use of insulin (HCC) Hyperlipidemia LDL goal <70 GERD (gastroesophageal reflux disease) Coronary artery disease  # Bacteremia, endocarditis, enterococcus bacteremia. -TEE with mobile mass c/w vegetation, EF normal, no evidence for heart failure - Per Dr. 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 Currently he has no stent. 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 His blood cultures sent on 08/11/19 as OP was positive for enterococcus (4/4). Dr.Ravisankar recommendedto put pt on "IV Amp+ gent combo, creatinine remained stable, ID recommended to continue ampicillin and ceftriaxone for total 6 weeks, repeat blood cultures negative at 48 hours.  PICC line will be inserted today before discharge.  Case manager help appreciated, infusion company will arrange antibiotics for home and will instruct patient daughter is well before discharge. -Blood culture ordered on admission on 08/13/2019 again shows enterococci, repeat blood culture from 08/16/2019 negative so far. -Urine  culture from 08/13/2019 is so far  negative as well. Patient has a history of status post right staghorn calculus lithotripsy and stent in recent past. -Urology consulted, recommended to start cranberry supplements twice daily for UTI prevention and potassium citrate 30 mg p.o. twice daily for prevention of recurrent uric acid stones.  Follow-up with urologist as an outpatient. Ultrasound -- Negative for any hydro or acute issues.    HTN: Stable, -Continue home medications, blood pressure was soft but is stable now.  Patient was advised to to BP at home and follow with PCP  Type 2 diabetes mellitus without complication, with long-term current use of insulin ([place]): Stable, - most recent A1c8.0, poorly controled. Patient is takingglipizide, NovoLog, 70/30 insulinat home, -Continue 70/30 insulindose from 90 to 60 units bid.  Monitor fingerstick blood glucose at home and follow-up with PCP  Hyperlipidemia LDL goal <70, on lipitor GERD (gastroesophageal reflux disease): on -protonix Coronary artery disease: s/p of CABG. No CP, -on ASA, lipitor, Imdur and prn NGT Body mass index is 36.18 kg/m.  Nutrition Interventions: none   No Pain control/controlled substance prescribed - [facility] Controlled Substance Reporting System database was not reviewed. - Patient was instructed, not to drive, operate heavy machinery, perform activities at heights, swimming or participation in water activities or provide baby sitting services while on Pain, Sleep and Anxiety Medications; until his outpatient Physician has advised to do so again.  - Also recommended to not to take more than prescribed Pain, Sleep and Anxiety Medications.  Patient was ambulatory without any assistance. Patient was not seen by physical therapy, patient was ambulatory.  On the day of the discharge the patient's vitals were stable, and no other acute medical condition were reported by patient. the patient was felt safe to be  discharge at Home with RN arrangement, face-to-face completed as patient will need assistance for antibiotic use at home.  Consultants: Cardiologist Geni Bers, urologist Va[place] ID Lyn Henri   Procedures: TEE positive for aortic valve endocarditis PICC line insertion before discharge on 08/18/2019  Discharge Exam: General: Appear in mild distress, no rash; Oral Mucosa Clear, moist. Cardiovascular: S1 and S2 Present, no murmur, Respiratory: normal respiratory effort, Bilateral Air entry present and no Crackles, no wheezes Abdomen: Bowel Sound present, Soft and no tenderness, no hernia Extremities: no Pedal edema, no calf tenderness Neurology: alert and oriented to time, place, and person affect appropriate.  Filed Weights   08/13/19 1040  Weight: 111.1 kg   Vitals:   08/18/19 0435 08/18/19 1227  BP: 130/71 135/81  Pulse: 93 91  Resp: 20 20  Temp: 97.9 F (36.6 C) 97.9 F (36.6 C)  SpO2: 96% 99%    DISCHARGE MEDICATION: Allergies as of 08/18/2019      Reactions   Metformin Diarrhea   Loose stools even with XR      Medication List    STOP taking these medications   amoxicillin 500 MG capsule Commonly known as: AMOXIL     TAKE these medications   amLODipine 5 MG tablet Commonly known as: NORVASC Take 5 mg by mouth daily.   ampicillin  IVPB Inject 12 g into the vein continuous. Indication:  Enterococcus bacteremia and prosthetic valve endocarditis Last Day of Therapy:  09/27/19 PICC Care Per Protocol: Labs weekly on Monday  while on IV antibiotics: __X CBC with differential __X CMP ESR once in 2 weeks on  A monday _X_ Please pull PICC at completion of IV antibiotics Fax weekly labs to Dr.Ravishankar promptly at [phone]   aspirin EC  81 MG tablet Take 81 mg by mouth 3 (three) times a week.   atorvastatin 80 MG tablet Commonly known as: LIPITOR Take 80 mg by mouth daily.   cefTRIAXone  IVPB Commonly known as: ROCEPHIN Inject 2 g into  the vein every 12 (twelve) hours. Indication:  Enterococcus bacteremia and prosthetic valve endocarditis Last Day of Therapy:  09/27/19 PICC Care Per Protocol: Labs weekly on Monday  while on IV antibiotics: __X CBC with differential __X CMP ESR once in 2 weeks on  A monday _X_ Please pull PICC at completion of IV antibiotics Fax weekly labs to Dr.Ravishankar promptly at [phone]   glipiZIDE 2.5 MG 24 hr tablet Commonly known as: GLUCOTROL XL Take 2.5 mg by mouth daily with breakfast.   insulin aspart 100 UNIT/ML injection Commonly known as: novoLOG Inject 0-5 Units into the skin at bedtime.   isosorbide mononitrate 60 MG 24 hr tablet Commonly known as: IMDUR Take 1 tablet (60 mg total) by mouth daily.   lisinopril 20 MG tablet Commonly known as: ZESTRIL Take 1 tablet (20 mg total) by mouth daily. Please call to schedule appointment for further refills. Thank you!   loratadine 10 MG tablet Commonly known as: CLARITIN Take 10 mg by mouth daily.   metoprolol succinate 25 MG 24 hr tablet Commonly known as: TOPROL-XL Take 25 mg by mouth daily.   nitroGLYCERIN 0.4 MG SL tablet Commonly known as: NITROSTAT Place 0.4 mg under the tongue every 5 (five) minutes as needed for chest pain.   NovoLIN 70/30 ReliOn (70-30) 100 UNIT/ML injection Generic drug: insulin NPH-regular Human Inject 90 Units into the skin 2 (two) times daily.   omeprazole 20 MG capsule Commonly known as: PRILOSEC Take 20 mg by mouth daily as needed (acid reflux).   Potassium Citrate 15 MEQ (1620 MG) Tbcr Take 2 tablets by mouth 2 (two) times daily.            Home Infusion Instuctions  (From admission, onward)         Start     Ordered   08/18/19 0000  Home infusion instructions    Question:  Instructions  Answer:  Flushing of vascular access device: 0.9% NaCl pre/post medication administration and prn patency; Heparin 100 u/ml, 527m for implanted ports and Heparin 10u/ml, 542mfor all other  central venous catheters.   08/18/19 1533         Allergies  Allergen Reactions  . Metformin Diarrhea    Loose stools even with XR   Discharge Instructions    Diet - low sodium heart healthy   Complete by: As directed    Discharge instructions   Complete by: As directed    Follow with PCP in 1 week, monitor BP and take antihypertensive medications accordingly if blood pressure remains low then contact PCP immediately. Follow-up with ID and continue to biotics and blood work has instructed. Follow-up with cardiologist as per schedule to repeat echocardiogram   Home infusion instructions   Complete by: As directed    Instructions: Flushing of vascular access device: 0.9% NaCl pre/post medication administration and prn patency; Heparin 100 u/ml, 27m47mor implanted ports and Heparin 10u/ml, 27ml67mr all other central venous catheters.   Increase activity slowly   Complete by: As directed       The results of significant diagnostics from this hospitalization (including imaging, microbiology, ancillary and laboratory) are listed below for reference.    Significant Diagnostic Studies: [place]  Result Date: 08/13/2019  CLINICAL DATA:  Evaluate for hydronephrosis. History of right kidney stones. EXAM: RENAL / URINARY TRACT ULTRASOUND COMPLETE COMPARISON:  CT scan June 28, 2019 FINDINGS: Right Kidney: Renal measurements: 12.8 x 5.9 x 5.2 cm = volume: 206 mL. Contains a 1.8 cm cyst. Mild prominence of the right renal pelvis without caliectasis. Left Kidney: Renal measurements: 14.2 x 6.2 x 5.8 cm = volume: 269 mL. Contains a 5 cm cyst. No hydronephrosis. Bladder: Appears normal for degree of bladder distention. Other: None. IMPRESSION: 1. Mild prominence of the right renal pelvis without caliectasis. No gross hydronephrosis. 2. Bilateral renal cysts. Electronically Signed   By: Dorise Bullion III M.D   On: 08/13/2019 12:51   DG Chest Port 1 View  Result Date: 08/13/2019 CLINICAL DATA:   Sepsis.  Fever.  Cough. EXAM: PORTABLE CHEST 1 VIEW COMPARISON:  06/26/2019 FINDINGS: Prior median sternotomy. Midline trachea. Borderline cardiomegaly. Atherosclerosis in the transverse aorta. No pleural effusion or pneumothorax. Clear lungs. IMPRESSION: No acute cardiopulmonary disease. Aortic Atherosclerosis (ICD10-I70.0). Electronically Signed   By: Abigail Miyamoto M.D.   On: 08/13/2019 11:33   ECHOCARDIOGRAM COMPLETE  Result Date: 08/16/2019   ECHOCARDIOGRAM REPORT   Patient Name:   Bastien Strawser [facility]. Date of Exam: 08/15/2019 Medical Rec #:  [id number]              Height:       69.0 in Accession #:    [id number]             Weight:       245.0 lb Date of Birth:  1946-05-29              BSA:          2.25 m Patient Age:    74 years               BP:           138/78 mmHg Patient Gender: M                      HR:           89 bpm. Exam Location:  [facility] Procedure: 2D Echo, Cardiac Doppler and Color Doppler Indications:     Aortic Valve Disorder 424.1  History:         Patient has no prior history of Echocardiogram examinations.                  CAD; Risk Factors:Hypertension.  Sonographer:     Alyse Low Roar Referring Phys:  [place] Diagnosing Phys: Bartholome Bill MD IMPRESSIONS  1. Left ventricular ejection fraction, by visual estimation, is 60 to 65%. The left ventricle has normal function. Left ventricular septal wall thickness was mildly increased. Mildly increased left ventricular posterior wall thickness. There is mildly increased left ventricular hypertrophy.  2. The left ventricle has no regional wall motion abnormalities.  3. Global right ventricle has normal systolic function.The right ventricular size is mildly enlarged. No increase in right ventricular wall thickness.  4. Left atrial size was normal.  5. Right atrial size was normal.  6. The mitral valve is grossly normal. Trivial mitral valve regurgitation.  7. The tricuspid valve is grossly normal.  8. The tricuspid valve is  grossly normal. Tricuspid valve regurgitation is mild.  9. The aortic valve was not well visualized. Aortic valve regurgitation is not visualized. Mild to moderate aortic valve sclerosis/calcification without any evidence of aortic stenosis. 10. The pulmonic  valve was not well visualized. Pulmonic valve regurgitation is trivial. 11. The aortic root was not well visualized. 12. Mildly elevated pulmonary artery systolic pressure. 13. The interatrial septum was not assessed. FINDINGS  Left Ventricle: Left ventricular ejection fraction, by visual estimation, is 60 to 65%. The left ventricle has normal function. The left ventricle has no regional wall motion abnormalities. Mildly increased left ventricular posterior wall thickness. There is mildly increased left ventricular hypertrophy. Right Ventricle: The right ventricular size is mildly enlarged. No increase in right ventricular wall thickness. Global RV systolic function is has normal systolic function. The tricuspid regurgitant velocity is 2.39 m/s, and with an assumed right atrial  pressure of 10 mmHg, the estimated right ventricular systolic pressure is mildly elevated at 32.8 mmHg. Left Atrium: Left atrial size was normal in size. Right Atrium: Right atrial size was normal in size Pericardium: There is no evidence of pericardial effusion. Mitral Valve: The mitral valve is grossly normal. Trivial mitral valve regurgitation. Tricuspid Valve: The tricuspid valve is grossly normal. Tricuspid valve regurgitation is mild. Aortic Valve: The aortic valve was not well visualized. Aortic valve regurgitation is not visualized. Mild to moderate aortic valve sclerosis/calcification is present, without any evidence of aortic stenosis. Aortic valve mean gradient measures 15.3 mmHg. Aortic valve peak gradient measures 28.1 mmHg. Aortic valve area, by VTI measures 1.06 cm. Pulmonic Valve: The pulmonic valve was not well visualized. Pulmonic valve regurgitation is trivial.  Pulmonic regurgitation is trivial. Aorta: The aortic root was not well visualized. IAS/Shunts: The interatrial septum was not assessed.  LEFT VENTRICLE PLAX 2D LVIDd:         3.99 cm  Diastology LVIDs:         2.71 cm  LV e' lateral:   9.57 cm/s LV PW:         1.25 cm  LV E/e' lateral: 7.9 LV IVS:        1.41 cm  LV e' medial:    5.77 cm/s LVOT diam:     1.70 cm  LV E/e' medial:  13.1 LV SV:         42 ml LV SV Index:   17.86 LVOT Area:     2.27 cm  RIGHT VENTRICLE RV S prime:     12.90 cm/s LEFT ATRIUM             Index       RIGHT ATRIUM           Index LA diam:        4.00 cm 1.78 cm/m  RA Area:     10.30 cm LA Vol (A2C):   42.7 ml 18.96 ml/m RA Volume:   18.40 ml  8.17 ml/m LA Vol (A4C):   52.9 ml 23.49 ml/m LA Biplane Vol: 47.6 ml 21.14 ml/m  AORTIC VALVE                    PULMONIC VALVE AV Area (Vmax):    0.99 cm     PV Vmax:        1.07 m/s AV Area (Vmean):   1.12 cm     PV Peak grad:   4.6 mmHg AV Area (VTI):     1.06 cm     RVOT Peak grad: 3 mmHg AV Vmax:           265.00 cm/s AV Vmean:          180.333 cm/s AV VTI:  0.462 m AV Peak Grad:      28.1 mmHg AV Mean Grad:      15.3 mmHg LVOT Vmax:         116.00 cm/s LVOT Vmean:        89.100 cm/s LVOT VTI:          0.216 m LVOT/AV VTI ratio: 0.47  AORTA Ao Root diam: 3.30 cm MITRAL VALVE                         TRICUSPID VALVE MV Area (PHT): 2.84 cm              TR Peak grad:   22.8 mmHg MV PHT:        77.43 msec            TR Vmax:        243.00 cm/s MV Decel Time: 267 msec MV E velocity: 75.70 cm/s  103 cm/s  SHUNTS MV A velocity: 119.00 cm/s 70.3 cm/s Systemic VTI:  0.22 m MV E/A ratio:  0.64        1.5       Systemic Diam: 1.70 cm  Bartholome Bill MD Electronically signed by Bartholome Bill MD Signature Date/Time: 08/16/2019/4:58:43 PM    Final    ECHO TEE  Result Date: 08/17/2019   TRANSESOPHOGEAL ECHO REPORT   Patient Name:   Fotios Amos [facility]. Date of Exam: 08/17/2019 Medical Rec #:  [id number]              Height:       69.0 in  Accession #:    [id number]             Weight:       245.0 lb Date of Birth:  1945/07/18              BSA:          2.25 m Patient Age:    30 years               BP:           83/37 mmHg Patient Gender: M                      HR:           93 bpm. Exam Location:  [facility]  Procedure: Transesophageal Echo, Color Doppler and Cardiac Doppler Indications:     Bacteremia 790.7  History:         Patient has prior history of Echocardiogram examinations, most                  recent 08/15/2019. Aortic Valve: A H/O AVR.  Sonographer:     Sherrie Sport RDCS (AE) Referring Phys:  [id number] Arvil Chaco Diagnosing Phys: Kate Sable MD  PROCEDURE: The transesophogeal probe was passed through the esophogus of the patient. The patient developed no complications during the procedure. IMPRESSIONS  1. Left ventricular ejection fraction, by visual estimation, is 60 to 65%. The left ventricle has normal function. There is no left ventricular hypertrophy.  2. The left ventricle has no regional wall motion abnormalities.  3. Global right ventricle has normal systolic function.The right ventricular size is normal. No increase in right ventricular wall thickness.  4. Left atrial size was not well visualized.  5. Right atrial size was not well visualized.  6. The mitral valve is normal in structure. Trivial mitral valve  regurgitation.  7. The tricuspid valve is normal in structure.  8. The tricuspid valve is normal in structure. Tricuspid valve regurgitation is trivial.  9. Aortic valve regurgitation is not visualized. 10. A bioprothetic valve is noted in the aortic position. There is a 10 x 5 mm mobile mass noted on the aortic valve prosthesis (clips 21-24)consistent with a vegetation in the current clinical context. 11. The pulmonic valve was grossly normal. Pulmonic valve regurgitation is not visualized. FINDINGS  Left Ventricle: Left ventricular ejection fraction, by visual estimation, is 60 to 65%. The left ventricle has normal  function. The left ventricle has no regional wall motion abnormalities. The left ventricular internal cavity size was the left ventricle is normal in size. There is no left ventricular hypertrophy. Right Ventricle: The right ventricular size is normal. No increase in right ventricular wall thickness. Global RV systolic function is has normal systolic function. Left Atrium: Left atrial size was not well visualized. Right Atrium: Right atrial size was not well visualized Pericardium: There is no evidence of pericardial effusion. Mitral Valve: The mitral valve is normal in structure. Trivial mitral valve regurgitation. Tricuspid Valve: The tricuspid valve is normal in structure. Tricuspid valve regurgitation is trivial. Aortic Valve: The aortic valve has been repaired/replaced. Aortic valve regurgitation is not visualized. A bioprothetic valve is noted in the aortic position. There is a 10 x 5 mm mobile mass noted on the aortic valve prosthesis (clips 21-24)consistent with a vegetation in the current clinical context. Pulmonic Valve: The pulmonic valve was grossly normal. Pulmonic valve regurgitation is not visualized. Aorta: The aortic root is normal in size and structure. Venous: The inferior vena cava was not well visualized. Shunts: No atrial level shunt detected by color flow Doppler.  Kate Sable MD Electronically signed by Kate Sable MD Signature Date/Time: 08/17/2019/4:51:49 PM    Final    [place] EKG SITE RITE  Result Date: 08/18/2019 If Site Rite image not attached, placement could not be confirmed due to current cardiac rhythm.   Microbiology: Recent Results (from the past 240 hour(s))  Urine Culture     Status: None   Collection Time: 08/11/19  2:14 PM   Specimen: Urine, Random  Result Value Ref Range Status   Specimen Description   Final    URINE, RANDOM Performed at [facility], [address] [place], [place] [address]    Special Requests   Final    NONE Performed at  [facility], [address] [place], [place] [address]    Culture   Final    NO GROWTH Performed at [facility], [place] [address], [place], [place] [address]    Report Status 08/12/2019 FINAL  Final  Blood culture (routine single)     Status: Abnormal   Collection Time: 08/11/19  2:14 PM   Specimen: BLOOD  Result Value Ref Range Status   Specimen Description   Final    BLOOD LEFT HAND Performed at [facility], [address] [place], [place] [address]    Special Requests   Final    BOTTLES DRAWN AEROBIC AND ANAEROBIC Blood Culture adequate volume Performed at [facility], [address] [place], [place] [address]    Culture  Setup Time   Final    GRAM POSITIVE COCCI IN BOTH AEROBIC AND ANAEROBIC BOTTLES CRITICAL RESULT CALLED TO, READ BACK BY AND VERIFIED WITH: DR. Tsosie Billing AT 0820 08/13/19.PMF Performed at [facility], [address], [place], [place]  [address]    Culture ENTEROCOCCUS FAECALIS (A)  Final   Report Status 08/15/2019 FINAL  Final   Organism ID, Bacteria ENTEROCOCCUS FAECALIS  Final      Susceptibility   Enterococcus faecalis - MIC*    AMPICILLIN <=2 SENSITIVE Sensitive     VANCOMYCIN <=0.5 SENSITIVE Sensitive     GENTAMICIN SYNERGY SENSITIVE Sensitive     * ENTEROCOCCUS FAECALIS  Blood culture (routine single)     Status: Abnormal   Collection Time: 08/11/19  2:14 PM   Specimen: BLOOD  Result Value Ref Range Status   Specimen Description   Final    BLOOD RIGHT HAND Performed at [facility], [address] [place], [place] [address]    Special Requests   Final    BOTTLES DRAWN AEROBIC AND ANAEROBIC Blood Culture adequate volume Performed at [facility], [address] [place], [place] [address]    Culture  Setup Time   Final    IN BOTH AEROBIC AND ANAEROBIC BOTTLES GRAM POSITIVE COCCI CRITICAL RESULT CALLED TO, READ BACK BY AND VERIFIED WITH: DR. Tsosie Billing AT 0820 08/13/19.PMF    Culture (A)  Final    ENTEROCOCCUS FAECALIS SUSCEPTIBILITIES PERFORMED ON PREVIOUS CULTURE WITHIN THE LAST 5 DAYS. Performed at [facility], [place] [address] [place], [place] [address]    Report Status 08/15/2019 FINAL  Final  Blood Culture ID Panel (Reflexed)     Status: Abnormal   Collection Time: 08/11/19  2:14 PM  Result Value Ref Range Status   Enterococcus species DETECTED (A) NOT DETECTED Final    Comment: CRITICAL RESULT CALLED TO, READ BACK BY AND VERIFIED WITH: DR. Tsosie Billing AT 0820 08/13/19.PMF    Vancomycin resistance NOT DETECTED NOT DETECTED Final   Listeria monocytogenes NOT DETECTED NOT DETECTED Final   Staphylococcus species NOT DETECTED NOT DETECTED Final   Staphylococcus aureus (BCID) NOT DETECTED NOT DETECTED Final   Streptococcus species NOT DETECTED NOT DETECTED Final   Streptococcus agalactiae NOT DETECTED NOT DETECTED Final   Streptococcus pneumoniae NOT DETECTED NOT DETECTED Final   Streptococcus pyogenes NOT DETECTED NOT DETECTED Final   Acinetobacter baumannii NOT DETECTED NOT DETECTED Final   Enterobacteriaceae species NOT DETECTED NOT DETECTED Final   Enterobacter cloacae complex NOT DETECTED NOT DETECTED Final   Escherichia coli NOT DETECTED NOT DETECTED Final   Klebsiella oxytoca NOT DETECTED NOT DETECTED Final   Klebsiella pneumoniae NOT DETECTED NOT DETECTED Final   Proteus species NOT DETECTED NOT DETECTED Final   Serratia marcescens NOT DETECTED NOT DETECTED Final   Haemophilus influenzae NOT DETECTED NOT DETECTED Final   Neisseria meningitidis NOT DETECTED NOT DETECTED Final   Pseudomonas aeruginosa NOT DETECTED NOT DETECTED Final   Candida albicans NOT DETECTED NOT DETECTED Final   Candida glabrata NOT DETECTED NOT DETECTED Final   Candida krusei NOT DETECTED NOT DETECTED Final   Candida parapsilosis NOT DETECTED NOT DETECTED Final   Candida tropicalis NOT DETECTED NOT DETECTED Final     Comment: Performed at [facility], [address] [place], [place] [address]  Blood Culture (routine x 2)     Status: Abnormal (Preliminary result)   Collection Time: 08/13/19 11:00 AM   Specimen: BLOOD RIGHT HAND  Result Value Ref Range Status   Specimen Description   Final    BLOOD RIGHT HAND Performed at [facility], [address], [place], [place] [address]    Special Requests   Final    BOTTLES  DRAWN AEROBIC AND ANAEROBIC Blood Culture adequate volume Performed at [facility], [address] [place], [place] [address]    Culture  Setup Time   Final    GRAM POSITIVE COCCI AEROBIC BOTTLE ONLY CRITICAL RESULT CALLED TO, READ BACK BY AND VERIFIED WITH: [facility] HALLAJI AT 5852 08/15/2019 SDR    Culture (A)  Final    ENTEROCOCCUS FAECALIS REPEATING SUSCEPTIBILITIES Performed at [facility], [place] [address], [place], [place] [address]    Report Status PENDING  Incomplete  Blood Culture (routine x 2)     Status: None (Preliminary result)   Collection Time: 08/13/19 11:00 AM   Specimen: BLOOD  Result Value Ref Range Status   Specimen Description   Final    BLOOD L AC Performed at [facility], [address], [place], [place] [address]    Special Requests   Final    BOTTLES DRAWN AEROBIC AND ANAEROBIC Blood Culture adequate volume Performed at [facility], [address], [place], [place] [address]    Culture  Setup Time   Final    GRAM POSITIVE COCCI IN BOTH AEROBIC AND ANAEROBIC BOTTLES CRITICAL RESULT CALLED TO, READ BACK BY AND VERIFIED WITH: SCOTT HALL 08/16/19 AT 0012 HS Performed at [facility], [place] [address] [place], [place] [address]    Culture GRAM POSITIVE COCCI  Final   Report Status PENDING  Incomplete  Urine culture     Status: None   Collection Time: 08/13/19 11:00 AM   Specimen: In/Out Cath Urine  Result Value Ref Range Status   Specimen Description   Final    IN/OUT CATH URINE Performed at  [facility], [address], [place], [place] [address]    Special Requests   Final    NONE Performed at [facility], [address] [place], [place] [address]    Culture   Final    NO GROWTH Performed at [facility], [place] [address] [place], [place] [address]    Report Status 08/15/2019 FINAL  Final  Respiratory Panel by RT PCR (Flu A&B, Covid) - Nasopharyngeal Swab     Status: None   Collection Time: 08/13/19 11:00 AM   Specimen: Nasopharyngeal Swab  Result Value Ref Range Status   SARS Coronavirus 2 by RT PCR NEGATIVE NEGATIVE Final    Comment: (NOTE) SARS-CoV-2 target nucleic acids are NOT DETECTED. The SARS-CoV-2 RNA is generally detectable in upper respiratoy specimens during the acute phase of infection. The lowest concentration of SARS-CoV-2 viral copies this assay can detect is 131 copies/mL. A negative result does not preclude SARS-Cov-2 infection and should not be used as the sole basis for treatment or other patient management decisions. A negative result may occur with  improper specimen collection/handling, submission of specimen other than nasopharyngeal swab, presence of viral mutation(s) within the areas targeted by this assay, and inadequate number of viral copies (<131 copies/mL). A negative result must be combined with clinical observations, patient history, and epidemiological information. The expected result is Negative. Fact Sheet for Patients:  PinkCheek.be Fact Sheet for Healthcare Providers:  GravelBags.it This test is not yet ap proved or cleared by the [place] FDA and  has been authorized for detection and/or diagnosis of SARS-CoV-2 by FDA under an Emergency Use Authorization (EUA). This EUA will remain  in effect (meaning this test can be used) for the duration of the COVID-19 declaration under Section 564(b)(1) of the Act, 21 U.S.C. section  360bbb-3(b)(1),  unless the authorization is terminated or revoked sooner.    Influenza A by PCR NEGATIVE NEGATIVE Final   Influenza B by PCR NEGATIVE NEGATIVE Final    Comment: (NOTE) The Xpert Xpress SARS-CoV-2/FLU/RSV assay is intended as an aid in  the diagnosis of influenza from Nasopharyngeal swab specimens and  should not be used as a sole basis for treatment. Nasal washings and  aspirates are unacceptable for Xpert Xpress SARS-CoV-2/FLU/RSV  testing. Fact Sheet for Patients: PinkCheek.be Fact Sheet for Healthcare Providers: GravelBags.it This test is not yet approved or cleared by the [place] FDA and  has been authorized for detection and/or diagnosis of SARS-CoV-2 by  FDA under an Emergency Use Authorization (EUA). This EUA will remain  in effect (meaning this test can be used) for the duration of the  Covid-19 declaration under Section 564(b)(1) of the Act, 21  U.S.C. section 360bbb-3(b)(1), unless the authorization is  terminated or revoked. Performed at [facility], [address] [place], [place] [address]   Blood Culture ID Panel (Reflexed)     Status: Abnormal   Collection Time: 08/13/19 11:00 AM  Result Value Ref Range Status   Enterococcus species DETECTED (A) NOT DETECTED Final    Comment: CRITICAL RESULT CALLED TO, READ BACK BY AND VERIFIED WITH: SCOTT HALL 08/16/19 AT 0012 HS    Vancomycin resistance NOT DETECTED NOT DETECTED Final   Listeria monocytogenes NOT DETECTED NOT DETECTED Final   Staphylococcus species NOT DETECTED NOT DETECTED Final   Staphylococcus aureus (BCID) NOT DETECTED NOT DETECTED Final   Streptococcus species NOT DETECTED NOT DETECTED Final   Streptococcus agalactiae NOT DETECTED NOT DETECTED Final   Streptococcus pneumoniae NOT DETECTED NOT DETECTED Final   Streptococcus pyogenes NOT DETECTED NOT DETECTED Final   Acinetobacter baumannii NOT DETECTED NOT DETECTED Final    Enterobacteriaceae species NOT DETECTED NOT DETECTED Final   Enterobacter cloacae complex NOT DETECTED NOT DETECTED Final   Escherichia coli NOT DETECTED NOT DETECTED Final   Klebsiella oxytoca NOT DETECTED NOT DETECTED Final   Klebsiella pneumoniae NOT DETECTED NOT DETECTED Final   Proteus species NOT DETECTED NOT DETECTED Final   Serratia marcescens NOT DETECTED NOT DETECTED Final   Haemophilus influenzae NOT DETECTED NOT DETECTED Final   Neisseria meningitidis NOT DETECTED NOT DETECTED Final   Pseudomonas aeruginosa NOT DETECTED NOT DETECTED Final   Candida albicans NOT DETECTED NOT DETECTED Final   Candida glabrata NOT DETECTED NOT DETECTED Final   Candida krusei NOT DETECTED NOT DETECTED Final   Candida parapsilosis NOT DETECTED NOT DETECTED Final   Candida tropicalis NOT DETECTED NOT DETECTED Final    Comment: Performed at [facility], [address] [place], [place] [address]  CULTURE, BLOOD (ROUTINE X 2) w Reflex to ID Panel     Status: None (Preliminary result)   Collection Time: 08/16/19  3:06 PM   Specimen: BLOOD  Result Value Ref Range Status   Specimen Description BLOOD RIGHT ANTECUBITAL  Final   Special Requests   Final    BOTTLES DRAWN AEROBIC AND ANAEROBIC Blood Culture adequate volume   Culture   Final    NO GROWTH 2 DAYS Performed at [facility], [address] [place], [place] [address]    Report Status PENDING  Incomplete  CULTURE, BLOOD (ROUTINE X 2) w Reflex to ID Panel     Status: None (Preliminary result)   Collection Time: 08/16/19  3:39 PM   Specimen: BLOOD  Result Value Ref Range  Status   Specimen Description BLOOD BLOOD RIGHT HAND  Final   Special Requests   Final    BOTTLES DRAWN AEROBIC AND ANAEROBIC Blood Culture adequate volume   Culture   Final    NO GROWTH 2 DAYS Performed at [facility], [address] [place], [place] [address]    Report Status PENDING  Incomplete     Labs: CBC: Recent Labs  Lab  08/13/19 1100 08/14/19 0536 08/18/19 0448  WBC 4.3 4.3 6.5  NEUTROABS 2.1  --   --   HGB 11.0* 10.3* 10.3*  HCT 33.0* 30.4* 31.2*  MCV 103.4* 103.8* 104.7*  PLT 128* 123* 169   Basic Metabolic Panel: Recent Labs  Lab 08/13/19 1100 08/14/19 0536 08/15/19 0536 08/16/19 0533 08/18/19 0448  NA 132* 138  --   --  139  K 4.4 4.6  --   --  3.7  CL 101 106  --   --  107  CO2 22 23  --   --  23  GLUCOSE 274* 162*  --   --  170*  BUN 20 13  --   --  10  CREATININE 1.12 0.74 0.78 0.69 0.77  CALCIUM 9.0 8.7*  --   --  8.6*  MG  --   --   --   --  1.7  PHOS  --   --   --   --  2.6   Liver Function Tests: Recent Labs  Lab 08/13/19 1100  AST 28  ALT 33  ALKPHOS 58  BILITOT 1.0  PROT 7.4  ALBUMIN 3.6   No results for input(s): LIPASE, AMYLASE in the last 168 hours. No results for input(s): AMMONIA in the last 168 hours. Cardiac Enzymes: No results for input(s): CKTOTAL, CKMB, CKMBINDEX, TROPONINI in the last 168 hours. BNP (last 3 results) No results for input(s): BNP in the last 8760 hours. CBG: Recent Labs  Lab 08/17/19 1642 08/17/19 2042 08/17/19 2207 08/18/19 0749 08/18/19 1225  GLUCAP 195* 235* 221* 144* 233*    Time spent: 35 minutes  Signed:  Val Riles  [facility]  08/18/2019 3:34 PM

## 2019-08-18 NOTE — Progress Notes (Signed)
PHARMACY CONSULT NOTE FOR:  OUTPATIENT  PARENTERAL ANTIBIOTIC THERAPY (OPAT)  Indication: Enterococcus bacteremia and prosthetic valve endocarditis Regimen: ampicillin 12g/24h continuous infusion + ceftriaxone 2 g q12h End date: 09/27/19  IV antibiotic discharge orders are pended. To discharging provider:  please sign these orders via discharge navigator,  Select New Orders & click on the button choice - Manage This Unsigned Work.     Thank you for allowing pharmacy to be a part of this patient's care.  Tawnya Crook, PharmD 08/18/2019, 1:22 PM

## 2019-08-18 NOTE — Progress Notes (Signed)
Patient alert and oriented vitals stable. Son at bedside all questions answered. Education completed. Discharge papers given to patient. Patient wheel chaired to [employer].

## 2019-08-18 NOTE — TOC Progression Note (Addendum)
Transition of Care (TOC) - Progression Note    Patient Details  Name: Jonathan Lee Vig Jr. MRN: [id number] Date of Birth: 12/05/1945  Transition of Care (TOC) CM/SW Contact  Sarah C Boswell, LCSW Phone Number: 08/18/2019, 9:24 AM  Clinical Narrative: Notified Pam Chandler with [facility] on plan for long-term IV antibiotics. Will meet with patient today to see which home health agency he prefers for nursing.  10:56 am: Met with patient to notify him that he is set up with [facility] for his home IV abx. Asked if he had a home health agency preference for nursing. He requested the agency he worked with most recently which is [facility]. Referral made to their representative.  Expected Discharge Plan: Home/Self Care Barriers to Discharge: Continued Medical Work up  Expected Discharge Plan and Services Expected Discharge Plan: Home/Self Care       Living arrangements for the past 2 months: Single Family Home                                       Social Determinants of Health (SDOH) Interventions    Readmission Risk Interventions Readmission Risk Prevention Plan 08/16/2019 06/30/2019  Transportation Screening Complete Complete  PCP or Specialist Appt within 3-5 Days Complete -  HRI or Home Care Consult - Patient refused  Social Work Consult for Recovery Care Planning/Counseling - Complete  Palliative Care Screening Not Applicable Not Applicable  Medication Review (RN Care Manager) Complete Complete  Some recent data might be hidden   

## 2019-08-19 LAB — CULTURE, BLOOD (ROUTINE X 2)
Special Requests: ADEQUATE
Special Requests: ADEQUATE

## 2019-08-21 LAB — CULTURE, BLOOD (ROUTINE X 2)
Culture: NO GROWTH
Culture: NO GROWTH
Special Requests: ADEQUATE
Special Requests: ADEQUATE

## 2019-08-22 ENCOUNTER — Ambulatory Visit: Admission: RE | Admit: 2019-08-22 | Payer: Medicare HMO | Source: Ambulatory Visit

## 2019-08-22 ENCOUNTER — Telehealth: Payer: Self-pay | Admitting: Urology

## 2019-08-22 NOTE — Telephone Encounter (Signed)
Called pt's son informed him of the information below. He gave verbal understanding.

## 2019-08-22 NOTE — Telephone Encounter (Signed)
Ok to cancel [place], he had one recently as an inpatient   Thanks [employer]

## 2019-08-22 NOTE — Telephone Encounter (Signed)
Pt's son called and states that his Father had a RUS in the hospital on 08/13/2019, he wants to know if he still needs to do the one scheduled for today.Please advise.

## 2019-08-22 NOTE — Telephone Encounter (Signed)
Incoming call from pt's son questing if today's u/s was truly neccessary as the pt had one on 08/13/2019. The son is concerned about cost but states he will do whatever you recommend. The u/s was scheduled for today 08/22/2019 at 9am, his f/u with you is on 08/31/2019. Please advise

## 2019-08-24 ENCOUNTER — Telehealth: Payer: Self-pay | Admitting: Internal Medicine

## 2019-08-24 NOTE — Telephone Encounter (Signed)
No answer. Left message to call back.   

## 2019-08-24 NOTE — Telephone Encounter (Signed)
Patient son states he is still on abx for infection and if appt is only routine s/p hospital they would rather wait.    Please advise on need to keep or delay

## 2019-08-25 NOTE — Telephone Encounter (Signed)
Patient son returning call

## 2019-08-25 NOTE — Telephone Encounter (Signed)
In review of chart, let son know it would be of benefit for the patient to keep and come to f/u appointment tomorrow. He verbalized understanding.

## 2019-08-26 ENCOUNTER — Encounter: Payer: Self-pay | Admitting: Family

## 2019-08-26 ENCOUNTER — Other Ambulatory Visit: Payer: Self-pay

## 2019-08-26 ENCOUNTER — Ambulatory Visit (INDEPENDENT_AMBULATORY_CARE_PROVIDER_SITE_OTHER): Payer: Medicare HMO | Admitting: Family

## 2019-08-26 VITALS — BP 120/54 | HR 103 | Ht 69.0 in | Wt 248.0 lb

## 2019-08-26 DIAGNOSIS — I350 Nonrheumatic aortic (valve) stenosis: Secondary | ICD-10-CM

## 2019-08-26 DIAGNOSIS — I10 Essential (primary) hypertension: Secondary | ICD-10-CM | POA: Diagnosis not present

## 2019-08-26 DIAGNOSIS — E785 Hyperlipidemia, unspecified: Secondary | ICD-10-CM | POA: Diagnosis not present

## 2019-08-26 DIAGNOSIS — I25118 Atherosclerotic heart disease of native coronary artery with other forms of angina pectoris: Secondary | ICD-10-CM

## 2019-08-26 MED ORDER — METOPROLOL SUCCINATE ER 25 MG PO TB24: 37.5000 mg | ORAL_TABLET | Freq: Every day | ORAL | 8 refills | Status: DC

## 2019-08-26 NOTE — Progress Notes (Signed)
Office Visit    Patient Name: Jonathan Cross. Date of Encounter: 08/26/2019  Primary Care Provider:  Georges Mouse, MD Primary Cardiologist:  Nelva Bush, MD Electrophysiologist:  None   Chief Complaint    Rexanne Mano Jonathan Cross. is a 74 y.o. male with a hx of CAD with significant three-vessel disease s/p CABG, AS s/p bioprosthetic aortic valve replacement [facility] 2017), HTN, HLD, remote history of tobacco abuse, DM2, GERD, OSA not on CPAP presents today for hospital follow up.   Past Medical History    Past Medical History:  Diagnosis Date  . Coronary artery disease   . Diabetes ([place])   . H/O aortic valve replacement   . History of kidney stones   . Hx of CABG   . Hyperlipidemia   . Hypertension   . Sleep apnea    Past Surgical History:  Procedure Laterality Date  . AORTIC VALVE REPLACEMENT  2017   [facility], bioprosthetic  . APPENDECTOMY    . CARDIAC VALVE REPLACEMENT    . CORONARY ARTERY BYPASS GRAFT  2017   [facility] - LIMA-LAD and SVG-OM  . CYSTOSCOPY W/ RETROGRADES Right 05/27/2019   Procedure: CYSTOSCOPY WITH RETROGRADE PYELOGRAM;  Surgeon: Billey Co, MD;  Location: [facility];  Service: Urology;  Laterality: Right;  . CYSTOSCOPY W/ RETROGRADES Right 07/11/2019   Procedure: CYSTOSCOPY WITH RETROGRADE PYELOGRAM;  Surgeon: Billey Co, MD;  Location: [facility];  Service: Urology;  Laterality: Right;  . CYSTOSCOPY/URETEROSCOPY/HOLMIUM LASER/STENT PLACEMENT Right 05/27/2019   Procedure: CYSTOSCOPY/URETEROSCOPY/HOLMIUM LASER/STENT PLACEMENT;  Surgeon: Billey Co, MD;  Location: [facility];  Service: Urology;  Laterality: Right;  . CYSTOSCOPY/URETEROSCOPY/HOLMIUM LASER/STENT PLACEMENT Right 06/17/2019   Procedure: CYSTOSCOPY/URETEROSCOPY/HOLMIUM LASER/STENT Exchange;  Surgeon: Billey Co, MD;  Location: [facility];  Service: Urology;  Laterality: Right;  . CYSTOSCOPY/URETEROSCOPY/HOLMIUM LASER/STENT PLACEMENT Right 07/11/2019   Procedure:  CYSTOSCOPY/URETEROSCOPY/HOLMIUM LASER/STENT Exchange;  Surgeon: Billey Co, MD;  Location: [facility];  Service: Urology;  Laterality: Right;  . STONE EXTRACTION WITH BASKET Right 07/11/2019   Procedure: STONE EXTRACTION WITH BASKET;  Surgeon: Billey Co, MD;  Location: [facility];  Service: Urology;  Laterality: Right;  . TEE WITHOUT CARDIOVERSION N/A 04/21/2019   Procedure: TRANSESOPHAGEAL ECHOCARDIOGRAM (TEE);  Surgeon: Minna Merritts, MD;  Location: [facility];  Service: Cardiovascular;  Laterality: N/A;  . TEE WITHOUT CARDIOVERSION N/A 07/04/2019   Procedure: TRANSESOPHAGEAL ECHOCARDIOGRAM (TEE);  Surgeon: Wellington Hampshire, MD;  Location: [facility];  Service: Cardiovascular;  Laterality: N/A;  . TEE WITHOUT CARDIOVERSION N/A 08/17/2019   Procedure: TRANSESOPHAGEAL ECHOCARDIOGRAM (TEE);  Surgeon: Kate Sable, MD;  Location: [facility];  Service: Cardiovascular;  Laterality: N/A;  . TONSILLECTOMY      Allergies  Allergies  Allergen Reactions  . Metformin Diarrhea    Loose stools even with XR    History of Present Illness    Jonathan Cross. is a 74 y.o. male with a hx of CAD with significant three-vessel disease s/p CABG, AS s/p bioprosthetic aortic valve replacement [facility] 2017), HTN, HLD, remote history of tobacco abuse, DM2, GERD, OSA not on CPAP, bacteremia last seen while hospitalized.  His heart disease is s/p CABG and bioprosthetic aortic valve replacement 2017 at [facility].  Underwent Lexiscan Myoview 05/2019 for anginal symptoms which ruled low risk study with hyperdynamic EF and no evidence of ischemia.  He was admitted to the hospital 08/13/2019 for bacteremia and endocarditis.  TEE showed mobile mass consistent with vegetation, normal EF, no  evidence of heart failure.Marland Kitchen  Discharged on IV antibiotics.  Presents today with his daughter for follow-up.  Denies chest pain, pressure, tightness.  Reports no shortness of breath at rest.  His primary complaint today is  fatigue.  We discussed that due to his body fighting active infection the fatigue was to be expected.  He reports he is dyspneic on exertion but it is stable at his baseline.  Does not check blood pressure routinely at home but when checked intermittently tells me he has been fine.  We discussed his tachycardia at length today.  Could be contributory to fatigue, DOE.  We discussed that it could be worse in the setting of his current infection however he was tachycardic during office visit on October when he was not presently fighting infection.  EKGs/Labs/Other Studies Reviewed:   The following studies were reviewed today:  Echo 08/16/19  1. Left ventricular ejection fraction, by visual estimation, is 60 to  65%. The left ventricle has normal function. Left ventricular septal wall  thickness was mildly increased. Mildly increased left ventricular  posterior wall thickness. There is mildly  increased left ventricular hypertrophy.   2. The left ventricle has no regional wall motion abnormalities.   3. Global right ventricle has normal systolic function.The right  ventricular size is mildly enlarged. No increase in right ventricular wall  thickness.   4. Left atrial size was normal.   5. Right atrial size was normal.   6. The mitral valve is grossly normal. Trivial mitral valve  regurgitation.   7. The tricuspid valve is grossly normal.   8. The tricuspid valve is grossly normal. Tricuspid valve regurgitation  is mild.   9. The aortic valve was not well visualized. Aortic valve regurgitation  is not visualized. Mild to moderate aortic valve sclerosis/calcification  without any evidence of aortic stenosis.  10. The pulmonic valve was not well visualized. Pulmonic valve  regurgitation is trivial.  11. The aortic root was not well visualized.  12. Mildly elevated pulmonary artery systolic pressure.  13. The interatrial septum was not assessed.    [place] 05/18/2019  The study is  normal.  This is a low risk study.  The left ventricular ejection fraction is hyperdynamic (>65%). EF 71%  No evidence for ischemia  LHC (10/05/2017, [facility]): Significant three-vessel coronary disease with 80% ostial LAD and 70% OM1 stenoses.  50% stenosis of the proximal RPDA 90% lesion of small RPL branch.  Patent LIMA to LAD and SVG to OM. LHC (02/08/2016): Significant two-vessel CAD with 80% ostial LAD and 90% RPL stenoses (small-caliber).  FFR of ostial LAD 0.80.  LVEDP 12 mmHg.   EKG:  EKG is  ordered today.  The ekg ordered today demonstrates ST 103 pm with voltage criteria for LVH - no acute ST/T wave changes.  Recent Labs: 08/13/2019: ALT 33 08/18/2019: BUN 10; Creatinine, Ser 0.77; Hemoglobin 10.3; Magnesium 1.7; Platelets 175; Potassium 3.7; Sodium 139  Recent Lipid Panel No results found for: CHOL, TRIG, HDL, CHOLHDL, VLDL, LDLCALC, LDLDIRECT  Home Medications   Current Meds  Medication Sig  . amLODipine (NORVASC) 5 MG tablet Take 5 mg by mouth daily.  Marland Kitchen ampicillin IVPB Inject 12 g into the vein continuous. Indication:  Enterococcus bacteremia and prosthetic valve endocarditis Last Day of Therapy:  09/27/19 PICC Care Per Protocol: Labs weekly on Monday  while on IV antibiotics: __X CBC with differential __X CMP ESR once in 2 weeks on  A monday _X_ Please pull PICC  at completion of IV antibiotics Fax weekly labs to [facility] promptly at [phone]  . aspirin EC 81 MG tablet Take 81 mg by mouth 3 (three) times a week.   Marland Kitchen atorvastatin (LIPITOR) 80 MG tablet Take 80 mg by mouth daily.  . cefTRIAXone (ROCEPHIN) IVPB Inject 2 g into the vein every 12 (twelve) hours. Indication:  Enterococcus bacteremia and prosthetic valve endocarditis Last Day of Therapy:  09/27/19 PICC Care Per Protocol: Labs weekly on Monday  while on IV antibiotics: __X CBC with differential __X CMP ESR once in 2 weeks on  A monday _X_ Please pull PICC at completion of IV antibiotics Fax weekly  labs to [place] promptly at [phone]  . glipiZIDE (GLUCOTROL XL) 2.5 MG 24 hr tablet Take 2.5 mg by mouth daily with breakfast.   . insulin aspart (NOVOLOG) 100 UNIT/ML injection Inject 0-5 Units into the skin at bedtime.  . isosorbide mononitrate (IMDUR) 60 MG 24 hr tablet Take 1 tablet (60 mg total) by mouth daily.  Marland Kitchen lisinopril (ZESTRIL) 20 MG tablet Take 1 tablet (20 mg total) by mouth daily. Please call to schedule appointment for further refills. Thank you!  Marland Kitchen loratadine (CLARITIN) 10 MG tablet Take 10 mg by mouth daily.  . metoprolol succinate (TOPROL-XL) 25 MG 24 hr tablet Take 1.5 tablets (37.5 mg total) by mouth daily.  . nitroGLYCERIN (NITROSTAT) 0.4 MG SL tablet Place 0.4 mg under the tongue every 5 (five) minutes as needed for chest pain.   Marland Kitchen NOVOLIN 70/30 RELION (70-30) 100 UNIT/ML injection Inject 90 Units into the skin 2 (two) times daily.   Marland Kitchen omeprazole (PRILOSEC) 20 MG capsule Take 20 mg by mouth daily as needed (acid reflux).   . Potassium Citrate 15 MEQ (1620 MG) TBCR Take 2 tablets by mouth 2 (two) times daily.  . [DISCONTINUED] metoprolol succinate (TOPROL-XL) 25 MG 24 hr tablet Take 25 mg by mouth daily.      Review of Systems   Review of Systems  Constitution: Positive for malaise/fatigue. Negative for chills and fever.  Cardiovascular: Positive for dyspnea on exertion. Negative for chest pain, leg swelling, near-syncope, orthopnea, palpitations and syncope.  Respiratory: Negative for cough, shortness of breath and wheezing.   Gastrointestinal: Negative for nausea and vomiting.  Neurological: Positive for weakness. Negative for dizziness and light-headedness.   All other systems reviewed and are otherwise negative except as noted above.  Physical Exam    VS:  BP (!) 120/54 (BP Location: Left Arm, Patient Position: Sitting, Cuff Size: Normal)   Pulse (!) 103   Ht 5' 9"  (1.753 m)   Wt 248 lb (112.5 kg)   SpO2 97%   BMI 36.62 kg/m  , BMI Body mass  index is 36.62 kg/m. GEN: Well nourished, overweight, well developed, in no acute distress. HEENT: normal. Neck: Supple, no JVD, carotid bruits, or masses. Cardiac: RRR, no rubs, or gallops. Gr 1/6 systolic murmur. No clubbing, cyanosis, edema.  Radials/PT 2+ and equal bilaterally.  Respiratory:  Respirations regular and unlabored, clear to auscultation bilaterally. GI: Soft, nontender, nondistended. MS: No deformity or atrophy. Skin: Warm and dry, no rash. R upper arm PICC line presently infusing - covering is clean, dry, intact. No signs of infection at site.  Neuro:  Strength and sensation are intact. Psych: Normal affect.   Assessment & Plan    1. Bacteremia/endocarditis -recent admission with TEE noting mobile mass consistent with vegetation on bioprosthetic aortic valve. Normal LVEF 60-65%.  Presently undergoing treatment with  IV antibiotics.  Anticipate recheck of echocardiogram and/or TEE in 3 to 6 months for re-evaluation.  2. HTN - Blood pressure well controlled continue present antihypertensive regimen.  Encouraged to check BP at home. Goal BP <130/80.   3. Tachycardia - Likely exacerbated by current infection.  However, elevated heart rate in office visit back in October.  We will trial metoprolol 37.5 mg.  Tells me he previously was intolerant of metoprolol 50 mg but has done well with 25 mg dose.  If he has side effects on the 37.5 mg dose he will return to 25 mg daily.  4. HLD, LDL goal <70 - Continue high intensity Atorvastatin. 09/2017 LDL 45. Update lipid panel with next lab work in our office.  5. CAD s/p CABG - Lexiscan Myoview 05/18/2019 ruled low risk. No chest pain, pressure, tightness since discharge. No indication for ischemic evaluation at this time.  Continue aspirin, beta-blocker, Imdur, statin, PRN nitroglycerin.  6. DM2 - Recent A1c 8 during recent hospitalization. Goal A1c <7. Follows with PCP. Encouraged to monitor dietary intake. Encouraged to make follow up  appt with PCP. May benefit from SGLT2i such as Jardiance for cardioprotective benefit.  Disposition: Follow up in 2 month(s) with Dr. Saunders Revel or APP.   Loel Dubonnet, NP 08/26/2019, 5:16 PM

## 2019-08-26 NOTE — Patient Instructions (Addendum)
Medication Instructions:  Your physician has recommended you make the following change in your medication:   CHANGE Metoprolol to 1.5 tablets daily.   This will hopefully help get your heart rate under a bit better control. A refill has sent to [facility].  *If you need a refill on your cardiac medications before your next appointment, please call your pharmacy*  Lab Work: No lab work today.   Testing/Procedures: You had an EKG today. It showed sinus tachycardia.   Your TEE in the hospital showed a normal pumping function and "A bioprothetic valve is noted in the aortic position. There is a 10 x 5 mm mobile mass noted on the aortic valve prosthesis (clips 21-24)consistent with a vegetation in the current clinical context." We will plan to continue antibiotics and consider a repeat ultrasound of your heart when you follow up in a few months.   Follow-Up: At [facility], you and your health needs are our priority.  As part of our continuing mission to provide you with exceptional heart care, we have created designated Provider Care Teams.  These Care Teams include your primary Cardiologist (physician) and Advanced Practice Providers (APPs -  Physician Assistants and Nurse Practitioners) who all work together to provide you with the care you need, when you need it.  Your next appointment:  2 months in person with Dr. Saunders Revel

## 2019-08-29 ENCOUNTER — Telehealth: Payer: Self-pay

## 2019-08-29 NOTE — Telephone Encounter (Signed)
[name] called to report patient 100.7 temp. Wants to know if they need to be seen by [place] or what to do. Advised appt Tue for VV and we can discuss fever. [name] stated fever gone since weekend. Will keep V V/ [facility]

## 2019-08-30 ENCOUNTER — Ambulatory Visit: Payer: Medicare HMO | Attending: Infectious Diseases | Admitting: Infectious Diseases

## 2019-08-30 ENCOUNTER — Other Ambulatory Visit: Payer: Self-pay

## 2019-08-30 ENCOUNTER — Encounter: Payer: Self-pay | Admitting: Infectious Diseases

## 2019-08-30 DIAGNOSIS — I330 Acute and subacute infective endocarditis: Secondary | ICD-10-CM | POA: Diagnosis not present

## 2019-08-30 DIAGNOSIS — R7881 Bacteremia: Secondary | ICD-10-CM | POA: Diagnosis not present

## 2019-08-30 DIAGNOSIS — T826XXD Infection and inflammatory reaction due to cardiac valve prosthesis, subsequent encounter: Secondary | ICD-10-CM

## 2019-08-30 DIAGNOSIS — B952 Enterococcus as the cause of diseases classified elsewhere: Secondary | ICD-10-CM | POA: Diagnosis not present

## 2019-08-30 NOTE — Progress Notes (Signed)
The purpose of this virtual visit is to provide medical care while limiting exposure to the novel coronavirus (COVID19) for both patient and office staff.   Consent was obtained for phone visit:  Yes.   Answered questions that patient had about telehealth interaction:  Yes.   I discussed the limitations, risks, security and privacy concerns of performing an evaluation and management service by telephone. I also discussed with the patient that there may be a patient responsible charge related to this service. The patient expressed understanding and agreed to proceed.   Patient Location: Home Provider Location:office  Pt was recently discharged from the hospital 2/421 He was being treated for enterococcus bacteremia and prosthetic valve endocarditis- he is on ampicillin 2 grams IV every 4 hours which is being given in a 24 hour infusion pump and also twice a day ceftriaxone 2 grams- End date 09/27/19 . His daughter is doing the IV. I spoke to her as well- She had trouble the first night with working the pump and he may have been without IV ampicillin for nearly 12-16 hrs after discharge. Since 2.6 he has been getting the medication regularly. He says over the weekend he had a temp of 101. He has chronic baseline cough He has no nausea, vomiting, diarrhea, loss of appetite, loss of smell or taste, abdominal pain, dysuria, hematuria , flank pain.  Today his temp is 99. He takes tylenol PRN but has not taken any in 24 hours. He has not come in contact with anyone with COVID. His sister tested positive last week but he has not seen her since Dec 2020. I also spoke to his nurse Lorenda Hatchet ( [phone]) who had seen him yesterday- she said his vitals were normal yesterday. PICC line site looked good I asked her to check on him tomorrow and if fever > 100.6 will need Blood culture. Told patient to monitor his temperature Continue IV antibiotics Will follow labs from yesterday Told him that I will be away  in march and Dr.Fitzgerald will cover me . Total time spent on the call with patient and his daughter and his nurse is 18  min

## 2019-08-31 ENCOUNTER — Ambulatory Visit: Payer: Self-pay | Admitting: Urology

## 2019-09-07 ENCOUNTER — Emergency Department: Payer: Self-pay

## 2019-09-07 ENCOUNTER — Other Ambulatory Visit: Payer: Self-pay

## 2019-09-07 ENCOUNTER — Emergency Department
Admission: EM | Admit: 2019-09-07 | Discharge: 2019-09-08 | Disposition: A | Discharge status: Home / Self Care | Payer: Medicare HMO | Attending: Emergency Medicine | Admitting: Emergency Medicine

## 2019-09-07 DIAGNOSIS — Z79899 Other long term (current) drug therapy: Secondary | ICD-10-CM | POA: Insufficient documentation

## 2019-09-07 DIAGNOSIS — Z951 Presence of aortocoronary bypass graft: Secondary | ICD-10-CM | POA: Insufficient documentation

## 2019-09-07 DIAGNOSIS — E119 Type 2 diabetes mellitus without complications: Secondary | ICD-10-CM | POA: Diagnosis not present

## 2019-09-07 DIAGNOSIS — Z7982 Long term (current) use of aspirin: Secondary | ICD-10-CM | POA: Insufficient documentation

## 2019-09-07 DIAGNOSIS — T82524A Displacement of infusion catheter, initial encounter: Secondary | ICD-10-CM | POA: Insufficient documentation

## 2019-09-07 DIAGNOSIS — I38 Endocarditis, valve unspecified: Secondary | ICD-10-CM | POA: Insufficient documentation

## 2019-09-07 DIAGNOSIS — I10 Essential (primary) hypertension: Secondary | ICD-10-CM | POA: Insufficient documentation

## 2019-09-07 DIAGNOSIS — Z87891 Personal history of nicotine dependence: Secondary | ICD-10-CM | POA: Insufficient documentation

## 2019-09-07 DIAGNOSIS — Z794 Long term (current) use of insulin: Secondary | ICD-10-CM | POA: Diagnosis not present

## 2019-09-07 DIAGNOSIS — I2510 Atherosclerotic heart disease of native coronary artery without angina pectoris: Secondary | ICD-10-CM | POA: Diagnosis not present

## 2019-09-07 LAB — CBC WITH DIFFERENTIAL/PLATELET
Abs Immature Granulocytes: 0.01 10*3/uL (ref 0.00–0.07)
Basophils Absolute: 0 10*3/uL (ref 0.0–0.1)
Basophils Relative: 1 %
Eosinophils Absolute: 0.1 10*3/uL (ref 0.0–0.5)
Eosinophils Relative: 4 %
HCT: 32.6 % — ABNORMAL LOW (ref 39.0–52.0)
Hemoglobin: 10.7 g/dL — ABNORMAL LOW (ref 13.0–17.0)
Immature Granulocytes: 0 %
Lymphocytes Relative: 48 %
Lymphs Abs: 1.5 10*3/uL (ref 0.7–4.0)
MCH: 35.4 pg — ABNORMAL HIGH (ref 26.0–34.0)
MCHC: 32.8 g/dL (ref 30.0–36.0)
MCV: 107.9 fL — ABNORMAL HIGH (ref 80.0–100.0)
Monocytes Absolute: 0.4 10*3/uL (ref 0.1–1.0)
Monocytes Relative: 13 %
Neutro Abs: 1.1 10*3/uL — ABNORMAL LOW (ref 1.7–7.7)
Neutrophils Relative %: 34 %
Platelets: 141 10*3/uL — ABNORMAL LOW (ref 150–400)
RBC: 3.02 MIL/uL — ABNORMAL LOW (ref 4.22–5.81)
RDW: 16.1 % — ABNORMAL HIGH (ref 11.5–15.5)
WBC: 3.2 10*3/uL — ABNORMAL LOW (ref 4.0–10.5)
nRBC: 0 % (ref 0.0–0.2)

## 2019-09-07 LAB — BASIC METABOLIC PANEL
Anion gap: 10 (ref 5–15)
BUN: 21 mg/dL (ref 8–23)
CO2: 23 mmol/L (ref 22–32)
Calcium: 8.8 mg/dL — ABNORMAL LOW (ref 8.9–10.3)
Chloride: 103 mmol/L (ref 98–111)
Creatinine, Ser: 0.96 mg/dL (ref 0.61–1.24)
GFR calc Af Amer: >60 mL/min (ref >60)
GFR calc non Af Amer: >60 mL/min (ref >60)
Glucose, Bld: 214 mg/dL — ABNORMAL HIGH (ref 70–99)
Potassium: 4.5 mmol/L (ref 3.5–5.1)
Sodium: 136 mmol/L (ref 135–145)

## 2019-09-07 MED ORDER — SODIUM CHLORIDE 0.9 % IV SOLN
2.0000 g | INTRAVENOUS | Status: DC
  Administered 2019-09-07 – 2019-09-08 (×2): 2 g via INTRAVENOUS
  Filled 2019-09-07 (×7): qty 2000

## 2019-09-07 MED ORDER — SODIUM CHLORIDE 0.9 % IV SOLN
2.0000 g | Freq: Two times a day (BID) | INTRAVENOUS | Status: DC
  Administered 2019-09-07: 23:00:00 2 g via INTRAVENOUS
  Filled 2019-09-07: qty 20

## 2019-09-07 MED ORDER — SODIUM CHLORIDE 0.9 % IV SOLN: Freq: Once | INTRAVENOUS | Status: DC

## 2019-09-07 MED ORDER — SODIUM CHLORIDE 0.9 % IV SOLN: Freq: Once | INTRAVENOUS | Status: AC

## 2019-09-07 NOTE — ED Notes (Signed)
Dr Isaacs at bedside 

## 2019-09-07 NOTE — ED Provider Notes (Signed)
[facility] Emergency Department Provider Note  ____________________________________________   First MD Initiated Contact with Patient 09/07/19 2136     (approximate)  I have reviewed the triage vital signs and the nursing notes.   HISTORY  Chief Complaint pic line dislodged    HPI Jonathan Cross. is a 74 y.o. 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 Pt reports that at around 7:30 today, he looked down and noticed that his PICC line was out. He believes it accidentally pulled out. He had small amoutn of bleeding that is now controlled. He is on Ampicillin continuous infusion and Rocephin twice a day. He did not take his Rocephin dose. He called his ID physician and was advised to come in. No fever, chills. He has otherwise been well. He is adamant he has not had any fever recently.        Past Medical History:  Diagnosis Date  . Coronary artery disease   . Diabetes ([place])   . H/O aortic valve replacement   . History of kidney stones   . Hx of CABG   . Hyperlipidemia   . Hypertension   . Sleep apnea     Patient Active Problem List   Diagnosis Date Noted  . Acute bacterial endocarditis   . Coronary artery disease   . Hypoalbuminemia 06/29/2019  . Hyperglycemia 06/29/2019  . Lactic acidosis 06/29/2019  . GERD (gastroesophageal reflux disease) 06/29/2019  . 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 Nonrheumatic aortic valve stenosis 05/13/2019  . Hyperlipidemia LDL goal <70 05/13/2019  . Preop cardiovascular exam 05/13/2019  . Bacteremia due to Enterococcus 04/18/2019  . Class 2 severe obesity due to excess calories with serious comorbidity and body mass index (BMI) of 38.0 to 38.9 in adult ([place]) 03/24/2018  . History of aortic valve replacement with bioprosthetic valve 08/07/2016  . Status post coronary artery bypass graft 08/07/2016  . Coronary artery disease of native artery of  native heart with stable angina pectoris ([place]) 02/11/2016  . SVT (supraventricular tachycardia) ([place]) 07/31/2015  . Type 2 diabetes mellitus without complication, with long-term current use of insulin ([place]) 03/28/2015  . Benign prostatic hyperplasia 04/07/2014  . OSA (obstructive sleep apnea) 04/07/2014  . Essential hypertension 05/16/2009    Past Surgical History:  Procedure Laterality Date  . AORTIC VALVE REPLACEMENT  2017   [facility], bioprosthetic  . APPENDECTOMY    . CARDIAC VALVE REPLACEMENT    . CORONARY ARTERY BYPASS GRAFT  2017   [facility] - [facility] and SVG-OM  . CYSTOSCOPY W/ RETROGRADES Right 05/27/2019   Procedure: CYSTOSCOPY WITH RETROGRADE PYELOGRAM;  Surgeon: Billey Co, MD;  Location: [facility] ORS;  Service: Urology;  Laterality: Right;  . CYSTOSCOPY W/ RETROGRADES Right 07/11/2019   Procedure: CYSTOSCOPY WITH RETROGRADE PYELOGRAM;  Surgeon: Billey Co, MD;  Location: [facility] ORS;  Service: Urology;  Laterality: Right;  . CYSTOSCOPY/URETEROSCOPY/HOLMIUM LASER/STENT PLACEMENT Right 05/27/2019   Procedure: CYSTOSCOPY/URETEROSCOPY/HOLMIUM LASER/STENT PLACEMENT;  Surgeon: Billey Co, MD;  Location: [facility] ORS;  Service: Urology;  Laterality: Right;  . CYSTOSCOPY/URETEROSCOPY/HOLMIUM LASER/STENT PLACEMENT Right 06/17/2019   Procedure: CYSTOSCOPY/URETEROSCOPY/HOLMIUM LASER/STENT Exchange;  Surgeon: Billey Co, MD;  Location: [facility] OR[facility];  Service: Urology;  Laterality: Right;  . CYSTOSCOPY/URETEROSCOPY/HOLMIUM LASER/STENT PLACEMENT Right 07/11/2019   Procedure: CYSTOSCOPY/URETEROSCOPY/HOLMIUM LASER/STENT Exchange;  Surgeon: Billey Co, MD;  Location: [facility] OR[facility];  Service: Urology;  Laterality: Right;  . STONE EXTRACTION WITH BASKET Right 07/11/2019   Procedure:  STONE EXTRACTION WITH BASKET;  Surgeon: Billey Co, MD;  Location: [facility] OR[facility];  Service: Urology;  Laterality: Right;  . TEE WITHOUT CARDIOVERSION N/A 04/21/2019   Procedure: TRANSESOPHAGEAL ECHOCARDIOGRAM (TEE);   Surgeon: Minna Merritts, MD;  Location: [facility] ORS;  Service: Cardiovascular;  Laterality: N/A;  . TEE WITHOUT CARDIOVERSION N/A 07/04/2019   Procedure: TRANSESOPHAGEAL ECHOCARDIOGRAM (TEE);  Surgeon: Wellington Hampshire, MD;  Location: [facility] OR[facility];  Service: Cardiovascular;  Laterality: N/A;  . TEE WITHOUT CARDIOVERSION N/A 08/17/2019   Procedure: TRANSESOPHAGEAL ECHOCARDIOGRAM (TEE);  Surgeon: Kate Sable, MD;  Location: [facility] ORS;  Service: Cardiovascular;  Laterality: N/A;  . TONSILLECTOMY      Prior to Admission medications   Medication Sig Start Date End Date Taking? Authorizing Provider  amLODipine (NORVASC) 5 MG tablet Take 5 mg by mouth daily. 03/08/19   [provider]  ampicillin IVPB Inject 12 g into the vein continuous. Indication:  Enterococcus bacteremia and prosthetic valve endocarditis Last Day of Therapy:  09/27/19 PICC Care Per Protocol: Labs weekly on Monday  while on IV antibiotics: __X CBC with differential __X CMP ESR once in 2 weeks on  A monday _X_ Please pull PICC at completion of IV antibiotics Fax weekly labs to [place] promptly at [phone] 08/18/19 09/27/19  Val Riles, MD  aspirin EC 81 MG tablet Take 81 mg by mouth 3 (three) times a week.  05/30/14   [provider]  atorvastatin (LIPITOR) 80 MG tablet Take 80 mg by mouth daily. 08/13/18   [provider]  cefTRIAXone (ROCEPHIN) IVPB Inject 2 g into the vein every 12 (twelve) hours. Indication:  Enterococcus bacteremia and prosthetic valve endocarditis Last Day of Therapy:  09/27/19 PICC Care Per Protocol: Labs weekly on Monday  while on IV antibiotics: __X CBC with differential __X CMP ESR once in 2 weeks on  A monday _X_ Please pull PICC at completion of IV antibiotics Fax weekly labs to [place] promptly at [phone] 08/18/19 09/27/19  Val Riles, MD  glipiZIDE (GLUCOTROL XL) 2.5 MG 24 hr tablet Take 2.5 mg by mouth daily with breakfast.  05/05/19    [provider]  insulin aspart (NOVOLOG) 100 UNIT/ML injection Inject 0-5 Units into the skin at bedtime. 04/23/19   Epifanio Lesches, MD  isosorbide mononitrate (IMDUR) 60 MG 24 hr tablet Take 1 tablet (60 mg total) by mouth daily. 05/12/19 08/26/19  End, Harrell Gave, MD  lisinopril (ZESTRIL) 20 MG tablet Take 1 tablet (20 mg total) by mouth daily. Please call to schedule appointment for further refills. Thank you! 08/09/19 05/05/20  End, Harrell Gave, MD  loratadine (CLARITIN) 10 MG tablet Take 10 mg by mouth daily. 09/13/18 09/13/19  [provider]  metoprolol succinate (TOPROL-XL) 25 MG 24 hr tablet Take 1.5 tablets (37.5 mg total) by mouth daily. 08/26/19 05/22/20  Loel Dubonnet, NP  nitroGLYCERIN (NITROSTAT) 0.4 MG SL tablet Place 0.4 mg under the tongue every 5 (five) minutes as needed for chest pain.  03/24/18 08/30/19  [provider]  NOVOLIN 70/30 RELION (70-30) 100 UNIT/ML injection Inject 90 Units into the skin 2 (two) times daily.  05/13/19   [provider]  omeprazole (PRILOSEC) 20 MG capsule Take 20 mg by mouth daily as needed (acid reflux).  04/22/18 08/30/19  [provider]  Potassium Citrate 15 MEQ (1620 MG) TBCR Take 2 tablets by mouth 2 (two) times daily. 07/20/19   Billey Co, MD    Allergies Metformin  Family History  Problem  Relation Age of Onset  . Heart attack Father 20    Social History Social History   Tobacco Use  . Smoking status: Former Research scientist (life sciences)  . Smokeless tobacco: Never Used  Substance Use Topics  . Alcohol use: Never  . Drug use: Never    Review of Systems  Review of Systems  Constitutional: Negative for chills and fever.  HENT: Negative for sore throat.   Respiratory: Negative for shortness of breath.   Cardiovascular: Negative for chest pain.  Gastrointestinal: Negative for abdominal pain.  Genitourinary: Negative for flank pain.  Musculoskeletal: Negative for neck pain.  Skin: Negative for  rash and wound.  Allergic/Immunologic: Negative for immunocompromised state.  Neurological: Negative for weakness and numbness.  Hematological: Does not bruise/bleed easily.     ____________________________________________  PHYSICAL EXAM:      VITAL SIGNS: ED Triage Vitals  Enc Vitals Group     BP 09/07/19 1946 (!) 119/50     Pulse Rate 09/07/19 1946 (!) 104     Resp 09/07/19 1946 20     Temp 09/07/19 1946 98.3 F (36.8 C)     Temp Source 09/07/19 1946 Oral     SpO2 09/07/19 1946 97 %     Weight 09/07/19 1946 248 lb (112.5 kg)     Height 09/07/19 1946 5' 9"  (1.753 m)     Head Circumference --      Peak Flow --      Pain Score 09/07/19 2000 0     Pain Loc --      Pain Edu? --      Excl. in [place]? --      Physical Exam Vitals and nursing note reviewed.  Constitutional:      General: He is not in acute distress.    Appearance: He is well-developed.  HENT:     Head: Normocephalic and atraumatic.  Eyes:     Conjunctiva/sclera: Conjunctivae normal.  Cardiovascular:     Rate and Rhythm: Normal rate and regular rhythm.     Heart sounds: Normal heart sounds.  Pulmonary:     Effort: Pulmonary effort is normal. No respiratory distress.     Breath sounds: No wheezing.  Abdominal:     General: There is no distension.  Musculoskeletal:     Cervical back: Neck supple.  Skin:    General: Skin is warm.     Capillary Refill: Capillary refill takes less than 2 seconds.     Findings: No rash.  Neurological:     Mental Status: He is alert and oriented to person, place, and time.     Motor: No abnormal muscle tone.       ____________________________________________   LABS (all labs ordered are listed, but only abnormal results are displayed)  Labs Reviewed  CBC WITH DIFFERENTIAL/PLATELET - Abnormal; Notable for the following components:      Result Value   WBC 3.2 (*)    RBC 3.02 (*)    Hemoglobin 10.7 (*)    HCT 32.6 (*)    MCV 107.9 (*)    MCH 35.4 (*)    RDW 16.1  (*)    Platelets 141 (*)    Neutro Abs 1.1 (*)    All other components within normal limits  BASIC METABOLIC PANEL - Abnormal; Notable for the following components:   Glucose, Bld 214 (*)    Calcium 8.8 (*)    All other components within normal limits    ____________________________________________  EKG: None ________________________________________  RADIOLOGY All imaging, including plain films, CT scans, and ultrasounds, independently reviewed by me, and interpretations confirmed via formal radiology reads.  ED MD interpretation:   None  Official radiology report(s): [place] EKG SITE RITE  Result Date: 09/07/2019 If Site Rite image not attached, placement could not be confirmed due to current cardiac rhythm.   ____________________________________________  PROCEDURES   Procedure(s) performed (including Critical Care):  Procedures  ____________________________________________  INITIAL IMPRESSION / MDM / [place] / ED COURSE  As part of my medical decision making, I reviewed the following data within the [place] notes reviewed and incorporated, Old chart reviewed, Notes from prior ED visits, and [place] Controlled Substance Database       *Jonathan Cross. was evaluated in Emergency Department on 09/07/2019 for the symptoms described in the history of present illness. He was evaluated in the context of the global COVID-19 pandemic, which necessitated consideration that the patient might be at risk for infection with the SARS-CoV-2 virus that causes COVID-19. Institutional protocols and algorithms that pertain to the evaluation of patients at risk for COVID-19 are in a state of rapid change based on information released by regulatory bodies including the CDC and federal and state organizations. These policies and algorithms were followed during the patient's care in the ED.  Some ED evaluations and interventions may be delayed as a result of  limited staffing during the pandemic.*     Medical Decision Making:  74 yo M here with accidental PICC dislodgment. Pt is on Ampicillin continuous infusion with Rocephin 2g IV q12 hr. Will place IV, start on Amp 2g q4hr (per his recent admission), Rocephin and order for PICC placement in AM. He is otherwise afebrile, VSS. Denies any fevers or signs of recurrent systemic infection.  ____________________________________________  FINAL CLINICAL IMPRESSION(S) / ED DIAGNOSES  Final diagnoses:  Displacement of peripherally inserted central catheter (PICC) ([place])     MEDICATIONS GIVEN DURING THIS VISIT:  Medications  cefTRIAXone (ROCEPHIN) 2 g in sodium chloride 0.9 % 100 mL IVPB (2 g Intravenous New Bag/Given 09/07/19 2231)  ampicillin (OMNIPEN) 2 g in sodium chloride 0.9 % 100 mL IVPB (has no administration in time range)  0.9 %  sodium chloride infusion (has no administration in time range)     ED Discharge Orders    None       Note:  This document was prepared using Dragon voice recognition software and may include unintentional dictation errors.   Duffy Bruce, MD 09/07/19 2350

## 2019-09-07 NOTE — ED Triage Notes (Signed)
Pt to the er to have picc line replaced. Pt states he accidentally pulled out his piccline.

## 2019-09-07 NOTE — ED Notes (Signed)
Pt to ED for c/o pulling out PICC line. States he takes antibiotics 24/7 for infection in bloodstream. Pt in NAD at this time

## 2019-09-08 ENCOUNTER — Telehealth: Payer: Self-pay

## 2019-09-08 MED ORDER — SODIUM CHLORIDE 0.9% FLUSH: 10.0000 mL | Freq: Two times a day (BID) | INTRAVENOUS | Status: DC

## 2019-09-08 MED ORDER — SODIUM CHLORIDE 0.9% FLUSH: 10.0000 mL | INTRAVENOUS | Status: DC | PRN

## 2019-09-08 MED ORDER — CHLORHEXIDINE GLUCONATE CLOTH 2 % EX PADS
6.0000 | MEDICATED_PAD | Freq: Every day | CUTANEOUS | Status: DC
  Filled 2019-09-08: qty 6

## 2019-09-08 NOTE — ED Notes (Signed)
Discharge instructions reviewed with patient. Pt verbalized understanding.

## 2019-09-08 NOTE — ED Notes (Signed)
Pt up to bathroom with minimal assistance 

## 2019-09-08 NOTE — Telephone Encounter (Signed)
HE will have to see Dr.Fitzgerald at [facility] for prosthetic valve endocarditis and after he completes IV antibiotic he will need to be on long term ampicillin orally!

## 2019-09-08 NOTE — ED Notes (Signed)
Pt drinking coffee

## 2019-09-08 NOTE — ED Notes (Signed)
Pt waiting for PICC placement in am. Pt aware of plan of care, no complaints at this time.

## 2019-09-08 NOTE — Progress Notes (Signed)
Peripherally Inserted Central Catheter/Midline Placement  The IV Nurse has discussed with the patient and/or persons authorized to consent for the patient, the purpose of this procedure and the potential benefits and risks involved with this procedure.  The benefits include less needle sticks, lab draws from the catheter, and the patient may be discharged home with the catheter. Risks include, but not limited to, infection, bleeding, blood clot (thrombus formation), and puncture of an artery; nerve damage and irregular heartbeat and possibility to perform a PICC exchange if needed/ordered by physician.  Alternatives to this procedure were also discussed.  Bard Power PICC patient education guide, fact sheet on infection prevention and patient information card has been provided to patient /or left at bedside.    PICC/Midline Placement Documentation  PICC Double Lumen [id number] PICC Right Basilic 42 cm 0 cm (Active)  Indication for Insertion or Continuance of Line Home intravenous therapies (PICC only) 08/18/19 1629  Exposed Catheter (cm) 0 cm 08/18/19 1629  Site Assessment Clean;Dry;Intact 08/18/19 1629  Lumen #1 Status Flushed;Saline locked;Blood return noted 08/18/19 1629  Lumen #2 Status Flushed;Saline locked;Blood return noted 08/18/19 1629  Dressing Type Transparent 08/18/19 1629  Dressing Status Clean;Dry;Intact 08/18/19 1629  Dressing Intervention New dressing 08/18/19 [id number]  Dressing Change Due 08/25/19 08/18/19 1629     PICC Double Lumen 09/08/19 PICC Right Cephalic 42 cm 0 cm (Active)  Indication for Insertion or Continuance of Line Home intravenous therapies (PICC only) 09/08/19 0840  Exposed Catheter (cm) 0 cm 09/08/19 0840  Site Assessment Clean;Dry;Intact 09/08/19 0840  Lumen #1 Status Flushed;Saline locked;Blood return noted 09/08/19 0840  Lumen #2 Status Saline locked;Flushed;Blood return noted 09/08/19 0840  Dressing Type Transparent 09/08/19 0840  Dressing Status  Clean;Dry;Intact;Antimicrobial disc in place 09/08/19 [place] checked and tightened 09/08/19 0840  Line Adjustment (NICU/IV Team Only) No 09/08/19 0840  Dressing Intervention New dressing 09/08/19 0840  Dressing Change Due 09/15/19 09/08/19 0840       Rolena Infante 09/08/2019, 8:40 AM

## 2019-09-08 NOTE — Telephone Encounter (Signed)
Poke to patient about Picc Line. Went to ED last night after pulling it out by accident. He is fine seeing [place] while Dr. Delaine Lame is away and will await call from that office to make appt before 3/10 so we can order removal by 3/16. Denies fever and denies pain at this time.

## 2019-09-08 NOTE — Discharge Instructions (Signed)
You have been seen in the emergency department for a displaced PICC line.  The PICC line has been replaced.  Return to the emergency department for any symptoms personally concerning to yourself.

## 2019-09-09 NOTE — Telephone Encounter (Signed)
Advised Lillia Mountain at [facility] who is calling to set this up.

## 2019-09-21 ENCOUNTER — Telehealth: Payer: Self-pay | Admitting: Internal Medicine

## 2019-09-21 NOTE — Telephone Encounter (Signed)
Patient's son calling back.  He wanted to make sure that patient did not need any further testing at this time. Patient's 6 weeks of IV antibiotics via PICC line will end on 3/16 then he will switch to by mouth pills.  We reviewed last OV note stating: 1.  Presently undergoing treatment with IV antibiotics.  Anticipate recheck of echocardiogram and/or TEE in 3 to 6 months for re-evaluation.  He verbalized understanding and was appreciative.

## 2019-09-21 NOTE — Telephone Encounter (Signed)
No answer. Left message to call back.   

## 2019-09-21 NOTE — Telephone Encounter (Signed)
Patient son calling States that patient will be having a procedure soon and wants to clarify he does not need any more tests, like a TEE, done  Please call to discuss

## 2019-09-29 ENCOUNTER — Telehealth: Payer: Self-pay

## 2019-09-29 NOTE — Telephone Encounter (Signed)
Advised son that we are waiting on WBC count on last CBC before we can order PICC PULL. He said HH was on way at 10 am today to [place] that they rcvd orders. I checked with Dr, Ola Spurr and he did not order the pull buit does agree. Advised son. Son is still concerned since last WBC were not normal he may get sick again and end up in hospital. I advised him to monitor symptoms and daily temps. He is also taking oral Abx. Son feels confident that he will act on things fast if notices any alarming signs of infection again. He was given my contact information and instructed on how to reach [place] in urgent matter as well. He also said he has follow up in next few weeks and will come sooner if he feels he is concerned.

## 2019-09-29 NOTE — Telephone Encounter (Signed)
Thank you. Please ensure he has fu with Dr Ulice Dash in 3-4 weeks

## 2019-10-11 ENCOUNTER — Other Ambulatory Visit: Payer: Self-pay | Admitting: Infectious Diseases

## 2019-10-19 ENCOUNTER — Emergency Department: Payer: Medicare HMO

## 2019-10-19 ENCOUNTER — Other Ambulatory Visit: Payer: Self-pay

## 2019-10-19 ENCOUNTER — Emergency Department
Admission: EM | Admit: 2019-10-19 | Discharge: 2019-10-19 | Disposition: A | Discharge status: Home / Self Care | Payer: Medicare HMO | Attending: Emergency Medicine | Admitting: Emergency Medicine

## 2019-10-19 DIAGNOSIS — E119 Type 2 diabetes mellitus without complications: Secondary | ICD-10-CM | POA: Insufficient documentation

## 2019-10-19 DIAGNOSIS — I10 Essential (primary) hypertension: Secondary | ICD-10-CM | POA: Diagnosis not present

## 2019-10-19 DIAGNOSIS — Z951 Presence of aortocoronary bypass graft: Secondary | ICD-10-CM | POA: Insufficient documentation

## 2019-10-19 DIAGNOSIS — Z794 Long term (current) use of insulin: Secondary | ICD-10-CM | POA: Insufficient documentation

## 2019-10-19 DIAGNOSIS — Z87891 Personal history of nicotine dependence: Secondary | ICD-10-CM | POA: Diagnosis not present

## 2019-10-19 DIAGNOSIS — K8020 Calculus of gallbladder without cholecystitis without obstruction: Secondary | ICD-10-CM | POA: Diagnosis not present

## 2019-10-19 DIAGNOSIS — Z952 Presence of prosthetic heart valve: Secondary | ICD-10-CM | POA: Insufficient documentation

## 2019-10-19 DIAGNOSIS — K8050 Calculus of bile duct without cholangitis or cholecystitis without obstruction: Secondary | ICD-10-CM

## 2019-10-19 DIAGNOSIS — R1031 Right lower quadrant pain: Secondary | ICD-10-CM | POA: Diagnosis present

## 2019-10-19 LAB — COMPREHENSIVE METABOLIC PANEL
ALT: 47 U/L — ABNORMAL HIGH (ref 0–44)
AST: 34 U/L (ref 15–41)
Albumin: 4 g/dL (ref 3.5–5.0)
Alkaline Phosphatase: 56 U/L (ref 38–126)
Anion gap: 9 (ref 5–15)
BUN: 17 mg/dL (ref 8–23)
CO2: 22 mmol/L (ref 22–32)
Calcium: 8.8 mg/dL — ABNORMAL LOW (ref 8.9–10.3)
Chloride: 98 mmol/L (ref 98–111)
Creatinine, Ser: 0.69 mg/dL (ref 0.61–1.24)
GFR calc Af Amer: >60 mL/min (ref >60)
GFR calc non Af Amer: >60 mL/min (ref >60)
Glucose, Bld: 262 mg/dL — ABNORMAL HIGH (ref 70–99)
Potassium: 4 mmol/L (ref 3.5–5.1)
Sodium: 129 mmol/L — ABNORMAL LOW (ref 135–145)
Total Bilirubin: 0.6 mg/dL (ref 0.3–1.2)
Total Protein: 7.4 g/dL (ref 6.5–8.1)

## 2019-10-19 LAB — CBC WITH DIFFERENTIAL/PLATELET
Abs Immature Granulocytes: 0.04 10*3/uL (ref 0.00–0.07)
Basophils Absolute: 0 10*3/uL (ref 0.0–0.1)
Basophils Relative: 1 %
Eosinophils Absolute: 0.2 10*3/uL (ref 0.0–0.5)
Eosinophils Relative: 4 %
HCT: 37 % — ABNORMAL LOW (ref 39.0–52.0)
Hemoglobin: 12.6 g/dL — ABNORMAL LOW (ref 13.0–17.0)
Immature Granulocytes: 1 %
Lymphocytes Relative: 31 %
Lymphs Abs: 1.4 10*3/uL (ref 0.7–4.0)
MCH: 35.2 pg — ABNORMAL HIGH (ref 26.0–34.0)
MCHC: 34.1 g/dL (ref 30.0–36.0)
MCV: 103.4 fL — ABNORMAL HIGH (ref 80.0–100.0)
Monocytes Absolute: 0.5 10*3/uL (ref 0.1–1.0)
Monocytes Relative: 10 %
Neutro Abs: 2.4 10*3/uL (ref 1.7–7.7)
Neutrophils Relative %: 53 %
Platelets: 120 10*3/uL — ABNORMAL LOW (ref 150–400)
RBC: 3.58 MIL/uL — ABNORMAL LOW (ref 4.22–5.81)
RDW: 13.3 % (ref 11.5–15.5)
WBC: 4.5 10*3/uL (ref 4.0–10.5)
nRBC: 0 % (ref 0.0–0.2)

## 2019-10-19 LAB — FIBRIN DERIVATIVES D-DIMER (ARMC ONLY): Fibrin derivatives D-dimer (ARMC): 457.13 ng/mL (FEU) (ref 0.00–499.00)

## 2019-10-19 LAB — LIPASE, BLOOD: Lipase: 52 U/L — ABNORMAL HIGH (ref 11–51)

## 2019-10-19 MED ORDER — OXYCODONE-ACETAMINOPHEN 5-325 MG PO TABS: 1.0000 | ORAL_TABLET | Freq: Three times a day (TID) | ORAL | 0 refills | Status: DC | PRN

## 2019-10-19 MED ORDER — OXYCODONE-ACETAMINOPHEN 5-325 MG PO TABS
1.0000 | ORAL_TABLET | Freq: Once | ORAL | Status: AC
  Administered 2019-10-19: 11:00:00 1 via ORAL
  Filled 2019-10-19: qty 1

## 2019-10-19 MED ORDER — KETOROLAC TROMETHAMINE 30 MG/ML IJ SOLN
30.0000 mg | Freq: Once | INTRAMUSCULAR | Status: AC
  Administered 2019-10-19: 09:00:00 30 mg via INTRAVENOUS
  Filled 2019-10-19: qty 1

## 2019-10-19 MED ORDER — ONDANSETRON 4 MG PO TBDP: 4.0000 mg | ORAL_TABLET | Freq: Three times a day (TID) | ORAL | 0 refills | Status: DC | PRN

## 2019-10-19 MED ORDER — ONDANSETRON HCL 4 MG/2ML IJ SOLN
4.0000 mg | Freq: Once | INTRAMUSCULAR | Status: AC
  Administered 2019-10-19: 4 mg via INTRAVENOUS
  Filled 2019-10-19: qty 2

## 2019-10-19 MED ORDER — MORPHINE SULFATE (PF) 4 MG/ML IV SOLN
4.0000 mg | Freq: Once | INTRAVENOUS | Status: AC
  Administered 2019-10-19: 4 mg via INTRAVENOUS
  Filled 2019-10-19: qty 1

## 2019-10-19 NOTE — ED Notes (Signed)
Pt transported to CT ?

## 2019-10-19 NOTE — ED Notes (Signed)
Pt made aware of UA sample. Unable to provide urine sample at this time.

## 2019-10-19 NOTE — ED Provider Notes (Signed)
[facility] Emergency Department Provider Note       Time seen: ----------------------------------------- 7:46 AM on 10/19/2019 -----------------------------------------   I have reviewed the triage vital signs and the nursing notes.  HISTORY   Chief Complaint Flank Pain    HPI Jonathan Cross. is a 74 y.o. male with a history of coronary artery disease, diabetes, kidney stones, hyperlipidemia, hypertension who presents to the ED for right flank pain with nausea since 9 PM last night.  Patient reports history of kidney stones and feels similarly.  Pain is 10 out of 10.  Past Medical History:  Diagnosis Date  . Coronary artery disease   . Diabetes ([place])   . H/O aortic valve replacement   . History of kidney stones   . Hx of CABG   . Hyperlipidemia   . Hypertension   . Sleep apnea     Patient Active Problem List   Diagnosis Date Noted  . Acute bacterial endocarditis   . Coronary artery disease   . Hypoalbuminemia 06/29/2019  . Hyperglycemia 06/29/2019  . Lactic acidosis 06/29/2019  . GERD (gastroesophageal reflux disease) 06/29/2019  . 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 Nonrheumatic aortic valve stenosis 05/13/2019  . Hyperlipidemia LDL goal <70 05/13/2019  . Preop cardiovascular exam 05/13/2019  . Bacteremia due to Enterococcus 04/18/2019  . Class 2 severe obesity due to excess calories with serious comorbidity and body mass index (BMI) of 38.0 to 38.9 in adult ([place]) 03/24/2018  . History of aortic valve replacement with bioprosthetic valve 08/07/2016  . Status post coronary artery bypass graft 08/07/2016  . Coronary artery disease of native artery of native heart with stable angina pectoris ([place]) 02/11/2016  . SVT (supraventricular tachycardia) ([place]) 07/31/2015  . Type 2 diabetes mellitus without complication, with long-term current use of insulin ([place]) 03/28/2015  . Benign prostatic hyperplasia 04/07/2014  . OSA (obstructive sleep  apnea) 04/07/2014  . Essential hypertension 05/16/2009    Past Surgical History:  Procedure Laterality Date  . AORTIC VALVE REPLACEMENT  2017   [facility], bioprosthetic  . APPENDECTOMY    . CARDIAC VALVE REPLACEMENT    . CORONARY ARTERY BYPASS GRAFT  2017   [facility] - [facility] and SVG-OM  . CYSTOSCOPY W/ RETROGRADES Right 05/27/2019   Procedure: CYSTOSCOPY WITH RETROGRADE PYELOGRAM;  Surgeon: Billey Co, MD;  Location: [facility] ORS;  Service: Urology;  Laterality: Right;  . CYSTOSCOPY W/ RETROGRADES Right 07/11/2019   Procedure: CYSTOSCOPY WITH RETROGRADE PYELOGRAM;  Surgeon: Billey Co, MD;  Location: [facility] OR[facility];  Service: Urology;  Laterality: Right;  . CYSTOSCOPY/URETEROSCOPY/HOLMIUM LASER/STENT PLACEMENT Right 05/27/2019   Procedure: CYSTOSCOPY/URETEROSCOPY/HOLMIUM LASER/STENT PLACEMENT;  Surgeon: Billey Co, MD;  Location: [facility] OR[facility];  Service: Urology;  Laterality: Right;  . CYSTOSCOPY/URETEROSCOPY/HOLMIUM LASER/STENT PLACEMENT Right 06/17/2019   Procedure: CYSTOSCOPY/URETEROSCOPY/HOLMIUM LASER/STENT Exchange;  Surgeon: Billey Co, MD;  Location: [facility];  Service: Urology;  Laterality: Right;  . CYSTOSCOPY/URETEROSCOPY/HOLMIUM LASER/STENT PLACEMENT Right 07/11/2019   Procedure: CYSTOSCOPY/URETEROSCOPY/HOLMIUM LASER/STENT Exchange;  Surgeon: Billey Co, MD;  Location: [facility];  Service: Urology;  Laterality: Right;  . STONE EXTRACTION WITH BASKET Right 07/11/2019   Procedure: STONE EXTRACTION WITH BASKET;  Surgeon: Billey Co, MD;  Location: [facility];  Service: Urology;  Laterality: Right;  . TEE WITHOUT CARDIOVERSION N/A 04/21/2019   Procedure: TRANSESOPHAGEAL ECHOCARDIOGRAM (TEE);  Surgeon: Minna Merritts, MD;  Location: [facility];  Service: Cardiovascular;  Laterality: N/A;  . TEE WITHOUT CARDIOVERSION N/A 07/04/2019   Procedure: TRANSESOPHAGEAL  ECHOCARDIOGRAM (TEE);  Surgeon: Wellington Hampshire, MD;  Location: [facility];  Service: Cardiovascular;  Laterality:  N/A;  . TEE WITHOUT CARDIOVERSION N/A 08/17/2019   Procedure: TRANSESOPHAGEAL ECHOCARDIOGRAM (TEE);  Surgeon: Kate Sable, MD;  Location: [facility];  Service: Cardiovascular;  Laterality: N/A;  . TONSILLECTOMY      Allergies Metformin  Social History Social History   Tobacco Use  . Smoking status: Former Research scientist (life sciences)  . Smokeless tobacco: Never Used  Substance Use Topics  . Alcohol use: Never  . Drug use: Never    Review of Systems Constitutional: Negative for fever. Cardiovascular: Negative for chest pain. Respiratory: Negative for shortness of breath. Gastrointestinal: Positive for abdominal pain, nausea Musculoskeletal: Negative for back pain. Skin: Negative for rash. Neurological: Negative for headaches, focal weakness or numbness.  All systems negative/normal/unremarkable except as stated in the HPI  ____________________________________________   PHYSICAL EXAM:  VITAL SIGNS: ED Triage Vitals  Enc Vitals Group     BP 10/19/19 0740 (!) 167/80     Pulse Rate 10/19/19 0739 81     Resp 10/19/19 0739 18     Temp 10/19/19 0739 (!) 97.5 F (36.4 C)     Temp Source 10/19/19 0739 Oral     SpO2 10/19/19 0739 97 %     Weight 10/19/19 0740 250 lb (113.4 kg)     Height 10/19/19 0740 5\' 9"  (1.753 m)     Head Circumference --      Peak Flow --      Pain Score 10/19/19 0740 10     Pain Loc --      Pain Edu? --      Excl. in [place]? --     Constitutional: Alert and oriented. Well appearing and in no distress. Eyes: Conjunctivae are normal. Normal extraocular movements. Cardiovascular: Normal rate, regular rhythm. No murmurs, rubs, or gallops. Respiratory: Normal respiratory effort without tachypnea nor retractions. Breath sounds are clear and equal bilaterally. No wheezes/rales/rhonchi. Gastrointestinal: Right flank tenderness, no rebound or guarding.  Normal bowel sounds. Musculoskeletal: Nontender with normal range of motion in extremities. No lower extremity tenderness  nor edema. Neurologic:  Normal speech and language. No gross focal neurologic deficits are appreciated.  Skin:  Skin is warm, dry and intact. No rash noted. Psychiatric: Mood and affect are normal. Speech and behavior are normal.  ___________________________________________  ED COURSE:  As part of my medical decision making, I reviewed the following data within the [place] History obtained from family if available, nursing notes, old chart and ekg, as well as notes from prior ED visits. Patient presented for right flank pain, we will assess with labs and imaging as indicated at this time.   Procedures  Rexanne Mano Miliano Sardo. was evaluated in Emergency Department on 10/19/2019 for the symptoms described in the history of present illness. He was evaluated in the context of the global COVID-19 pandemic, which necessitated consideration that the patient might be at risk for infection with the SARS-CoV-2 virus that causes COVID-19. Institutional protocols and algorithms that pertain to the evaluation of patients at risk for COVID-19 are in a state of rapid change based on information released by regulatory bodies including the CDC and federal and state organizations. These policies and algorithms were followed during the patient's care in the ED.  ____________________________________________   LABS (pertinent positives/negatives)  Labs Reviewed  CBC WITH DIFFERENTIAL/PLATELET - Abnormal; Notable for the following components:      Result Value   RBC 3.58 (*)  Hemoglobin 12.6 (*)    HCT 37.0 (*)    MCV 103.4 (*)    MCH 35.2 (*)    Platelets 120 (*)    All other components within normal limits  COMPREHENSIVE METABOLIC PANEL - Abnormal; Notable for the following components:   Sodium 129 (*)    Glucose, Bld 262 (*)    Calcium 8.8 (*)    ALT 47 (*)    All other components within normal limits  LIPASE, BLOOD - Abnormal; Notable for the following components:   Lipase 52 (*)     All other components within normal limits  FIBRIN DERIVATIVES D-DIMER ([facility] ONLY)  URINALYSIS, COMPLETE (U[facility]MP) WITH MICROSCOPIC    RADIOLOGY Images were viewed by me  CT renal protocol IMPRESSION:  1. No acute findings or explanation for the patient's symptoms.  2. Interval removal of right ureteral stent. No evidence of urinary  tract calculus or hydronephrosis.  3. Cholelithiasis without evidence of cholecystitis or biliary  dilatation.  4. Stable bilateral renal cysts.  5. Aortic Atherosclerosis (ICD10-I70.0).  ____________________________________________   DIFFERENTIAL DIAGNOSIS   Renal colic, UTI, pyelonephritis, muscle strain, radicular back pain, biliary colic  FINAL ASSESSMENT AND PLAN  Biliary colic   Plan: The patient had presented for right flank pain. Patient's labs revealed some nonspecific findings. Patient's imaging revealed gallstones but no other acute evidence for cholecystitis or intra-abdominal pathology.  D-dimer was negative.  I think he is having biliary colic, I suppose is possible that he passed a small gallstone.  He will be referred to general surgery for outpatient follow-up.   Laurence Aly, MD    Note: This note was generated in part or whole with voice recognition software. Voice recognition is usually quite accurate but there are transcription errors that can and very often do occur. I apologize for any typographical errors that were not detected and corrected.     Earleen Newport, MD 10/19/19 1106

## 2019-10-19 NOTE — ED Notes (Signed)
EDP Williams at bedside.  

## 2019-10-19 NOTE — ED Triage Notes (Signed)
Pt c/o right flank pain with nausea since 9pm last night. States he has a hx of kidney stones and this feels similar.

## 2019-10-27 ENCOUNTER — Ambulatory Visit: Payer: Self-pay | Admitting: Surgery

## 2019-11-01 ENCOUNTER — Encounter: Payer: Self-pay | Admitting: Surgery

## 2019-11-01 ENCOUNTER — Ambulatory Visit: Payer: Medicare HMO | Admitting: Surgery

## 2019-11-01 ENCOUNTER — Other Ambulatory Visit: Payer: Self-pay

## 2019-11-01 VITALS — BP 109/66 | HR 100 | Temp 98.4°F | Resp 16 | Ht 69.0 in | Wt 250.0 lb

## 2019-11-01 DIAGNOSIS — K8010 Calculus of gallbladder with chronic cholecystitis without obstruction: Secondary | ICD-10-CM | POA: Diagnosis not present

## 2019-11-01 NOTE — Progress Notes (Signed)
Patient ID: Jonathan James., male   DOB: [birth year], 74 y.o.   MRN: [id number]  Chief Complaint:  RUQ pain.   History of Present Illness Jonathan Dirksen. is a 74 y.o. male with a history of intermittent right upper quadrant pain for the last 2 to 3 weeks.  1 severe attack brought him to the emergency department, which had awakened him from sleep.  He is battling with a diabetic diet, and admits to losing on occasion.  Unfortunately cannot associate any particular food or food type that seems to provoke the pain.  It does radiate to his right flank and scapula.  He denies any loss of appetite.  Denies any nausea or vomiting, fevers or chills. Notable is vegetation on his bioprosthetic aortic valve on his last TEE.  He is currently taking oral antibiotics for this and may likely be taking him for the rest of his life.  It was presumed this may be from a kidney stone, because there was no known biliary involvement when this vegetation was evaluated.  Past Medical History Past Medical History:  Diagnosis Date  . Coronary artery disease   . Diabetes ([place])   . H/O aortic valve replacement   . History of kidney stones   . Hx of CABG   . Hyperlipidemia   . Hypertension   . Sleep apnea       Past Surgical History:  Procedure Laterality Date  . AORTIC VALVE REPLACEMENT  2017   [facility], bioprosthetic  . APPENDECTOMY    . CARDIAC VALVE REPLACEMENT    . CORONARY ARTERY BYPASS GRAFT  2017   [facility] - [facility] and SVG-OM  . CYSTOSCOPY W/ RETROGRADES Right 05/27/2019   Procedure: CYSTOSCOPY WITH RETROGRADE PYELOGRAM;  Surgeon: Billey Co, MD;  Location: [facility];  Service: Urology;  Laterality: Right;  . CYSTOSCOPY W/ RETROGRADES Right 07/11/2019   Procedure: CYSTOSCOPY WITH RETROGRADE PYELOGRAM;  Surgeon: Billey Co, MD;  Location: [facility];  Service: Urology;  Laterality: Right;  . CYSTOSCOPY/URETEROSCOPY/HOLMIUM LASER/STENT PLACEMENT Right 05/27/2019   Procedure:  CYSTOSCOPY/URETEROSCOPY/HOLMIUM LASER/STENT PLACEMENT;  Surgeon: Billey Co, MD;  Location: [facility];  Service: Urology;  Laterality: Right;  . CYSTOSCOPY/URETEROSCOPY/HOLMIUM LASER/STENT PLACEMENT Right 06/17/2019   Procedure: CYSTOSCOPY/URETEROSCOPY/HOLMIUM LASER/STENT Exchange;  Surgeon: Billey Co, MD;  Location: [facility];  Service: Urology;  Laterality: Right;  . CYSTOSCOPY/URETEROSCOPY/HOLMIUM LASER/STENT PLACEMENT Right 07/11/2019   Procedure: CYSTOSCOPY/URETEROSCOPY/HOLMIUM LASER/STENT Exchange;  Surgeon: Billey Co, MD;  Location: [facility];  Service: Urology;  Laterality: Right;  . STONE EXTRACTION WITH BASKET Right 07/11/2019   Procedure: STONE EXTRACTION WITH BASKET;  Surgeon: Billey Co, MD;  Location: [facility];  Service: Urology;  Laterality: Right;  . TEE WITHOUT CARDIOVERSION N/A 04/21/2019   Procedure: TRANSESOPHAGEAL ECHOCARDIOGRAM (TEE);  Surgeon: Minna Merritts, MD;  Location: [facility];  Service: Cardiovascular;  Laterality: N/A;  . TEE WITHOUT CARDIOVERSION N/A 07/04/2019   Procedure: TRANSESOPHAGEAL ECHOCARDIOGRAM (TEE);  Surgeon: Wellington Hampshire, MD;  Location: [facility];  Service: Cardiovascular;  Laterality: N/A;  . TEE WITHOUT CARDIOVERSION N/A 08/17/2019   Procedure: TRANSESOPHAGEAL ECHOCARDIOGRAM (TEE);  Surgeon: Kate Sable, MD;  Location: [facility];  Service: Cardiovascular;  Laterality: N/A;  . TONSILLECTOMY      Allergies  Allergen Reactions  . Metformin Diarrhea    Loose stools even with XR    Current Outpatient Medications  Medication Sig Dispense Refill  . amLODipine (NORVASC) 5 MG tablet Take 5 mg by mouth daily.    Marland Kitchen  amoxicillin (AMOXIL) 500 MG capsule     . Ascorbic Acid (VITAMIN C WITH ROSE HIPS) 500 MG tablet Take 500 mg by mouth daily.    Marland Kitchen aspirin EC 81 MG tablet Take 81 mg by mouth 3 (three) times a week.     Marland Kitchen atorvastatin (LIPITOR) 80 MG tablet Take 80 mg by mouth daily.    . AZO-CRANBERRY PO Take by mouth.    Marland Kitchen  glipiZIDE (GLUCOTROL XL) 2.5 MG 24 hr tablet Take 2.5 mg by mouth daily with breakfast.     . insulin aspart (NOVOLOG) 100 UNIT/ML injection Inject 0-5 Units into the skin at bedtime. 10 mL 11  . lisinopril (ZESTRIL) 20 MG tablet Take 1 tablet (20 mg total) by mouth daily. Please call to schedule appointment for further refills. Thank you! 30 tablet 0  . metoprolol succinate (TOPROL-XL) 25 MG 24 hr tablet Take 1.5 tablets (37.5 mg total) by mouth daily. 45 tablet 8  . NOVOLIN 70/30 RELION (70-30) 100 UNIT/ML injection Inject 90 Units into the skin 2 (two) times daily.     Marland Kitchen oxyCODONE-acetaminophen (PERCOCET) 5-325 MG tablet Take 1 tablet by mouth every 8 (eight) hours as needed. 20 tablet 0  . Potassium Citrate 15 MEQ (1620 MG) TBCR Take 2 tablets by mouth 2 (two) times daily. 120 tablet 6  . isosorbide mononitrate (IMDUR) 60 MG 24 hr tablet Take 1 tablet (60 mg total) by mouth daily. 90 tablet 3  . loratadine (CLARITIN) 10 MG tablet Take 10 mg by mouth daily.    . nitroGLYCERIN (NITROSTAT) 0.4 MG SL tablet Place 0.4 mg under the tongue every 5 (five) minutes as needed for chest pain.     Marland Kitchen omeprazole (PRILOSEC) 20 MG capsule Take 20 mg by mouth daily as needed (acid reflux).      No current facility-administered medications for this visit.    Family History Family History  Problem Relation Age of Onset  . Heart attack Father 76      Social History Social History   Tobacco Use  . Smoking status: Former Research scientist (life sciences)  . Smokeless tobacco: Never Used  Substance Use Topics  . Alcohol use: Never  . Drug use: Never        Review of Systems  Constitutional: Negative for chills and fever.  HENT: Negative for hearing loss.   Eyes: Negative for blurred vision.  Respiratory: Positive for wheezing. Negative for cough.   Cardiovascular: Positive for leg swelling. Negative for chest pain.  Gastrointestinal: Negative for nausea and vomiting.  Genitourinary: Negative.   Musculoskeletal:  Negative.   Skin: Negative for itching and rash.  Neurological: Negative.   Endo/Heme/Allergies: Negative.       Physical Exam Blood pressure 109/66, pulse 100, temperature 98.4 F (36.9 C), resp. rate 16, height 5\' 9"  (1.753 m), weight 250 lb (113.4 kg), SpO2 94 %. Last Weight  Most recent update: 11/01/2019 10:21 AM   Weight  113.4 kg (250 lb)            CONSTITUTIONAL: Well developed, and nourished, appropriately responsive and aware without distress.   EYES: Sclera non-icteric.   EARS, NOSE, MOUTH AND THROAT: Mask worn.    Hearing is intact to voice.  NECK: Trachea is midline, and there is no jugular venous distension.  LYMPH NODES:  Lymph nodes in the neck are not enlarged. RESPIRATORY:  Lungs are clear, and breath sounds are equal bilaterally. Normal respiratory effort without pathologic use of accessory muscles. CARDIOVASCULAR: Heart is regular  in rate and rhythm.  SEM, sternal scar.  GI: The abdomen is well rounded, soft, nontender, and nondistended. There were no palpable masses. I conot appreciate hepatosplenomegaly. There were normal bowel sounds. MUSCULOSKELETAL:  Symmetrical muscle tone appreciated in all four extremities.  Some LE edema, left worse than right.   SKIN: Skin turgor is normal. No pathologic skin lesions appreciated.  NEUROLOGIC:  Motor and sensation appear grossly normal.  Cranial nerves are grossly without defect. PSYCH:  Alert and oriented to person, place and time. Affect is appropriate for situation.  Data Reviewed I have personally reviewed what is currently available of the patient's imaging, recent labs and medical records.   Labs:  CBC Latest Ref Rng & Units 10/19/2019 09/07/2019 08/18/2019  WBC 4.0 - 10.5 K/uL 4.5 3.2(L) 6.5  Hemoglobin 13.0 - 17.0 g/dL 12.6(L) 10.7(L) 10.3(L)  Hematocrit 39.0 - 52.0 % 37.0(L) 32.6(L) 31.2(L)  Platelets 150 - 400 K/uL 120(L) 141(L) 175   CMP Latest Ref Rng & Units 10/19/2019 09/07/2019 08/18/2019  Glucose 70 - 99  mg/dL 262(H) 214(H) 170(H)  BUN 8 - 23 mg/dL 17 21 10   Creatinine 0.61 - 1.24 mg/dL 0.69 0.96 0.77  Sodium 135 - 145 mmol/L 129(L) 136 139  Potassium 3.5 - 5.1 mmol/L 4.0 4.5 3.7  Chloride 98 - 111 mmol/L 98 103 107  CO2 22 - 32 mmol/L 22 23 23   Calcium 8.9 - 10.3 mg/dL 8.8(L) 8.8(L) 8.6(L)  Total Protein 6.5 - 8.1 g/dL 7.4 - -  Total Bilirubin 0.3 - 1.2 mg/dL 0.6 - -  Alkaline Phos 38 - 126 U/L 56 - -  AST 15 - 41 U/L 34 - -  ALT 0 - 44 U/L 47(H) - -      Imaging: Radiology review:  CLINICAL DATA:  Right flank pain with nausea since last night. History of kidney stones.  EXAM: CT ABDOMEN AND PELVIS WITHOUT CONTRAST  TECHNIQUE: Multidetector CT imaging of the abdomen and pelvis was performed following the standard protocol without IV contrast.  COMPARISON:  Abdominopelvic CT 06/28/2019. Chest CT 04/17/2019.  FINDINGS: Lower chest: Stable 4 mm right lower lobe pulmonary nodule on image 8/4 from 04/17/2019, consistent with a benign finding. The lung bases are otherwise clear. No significant pleural or pericardial effusion. Atherosclerosis the aorta and coronary arteries.  Hepatobiliary: Stable small cyst in the left hepatic lobe, best seen on image 20/2. No new or enlarging hepatic lesions. Multiple small gallstones are present. No gallbladder wall thickening or biliary dilatation.  Pancreas: Unremarkable. No pancreatic ductal dilatation or surrounding inflammatory changes.  Spleen: Normal in size without focal abnormality.  Adrenals/Urinary Tract: Both adrenal glands appear normal. Right ureteral stent has been removed in the interval. There is no evidence of urinary tract calculus, hydronephrosis or asymmetric perinephric soft tissue stranding. Previously noted wall thickening of the right renal pelvis has improved. Stable bilateral renal cysts, largest in the left parapelvic region. The bladder appears unremarkable.  Stomach/Bowel: No evidence of bowel  wall thickening, distention or surrounding inflammatory change. Mild distal colonic diverticulosis.  Vascular/Lymphatic: There are no enlarged abdominal or pelvic lymph nodes. Stable small lymph nodes in the porta hepatis. Aortic and branch vessel atherosclerosis. No acute vascular findings on noncontrast imaging.  Reproductive: The prostate gland and seminal vesicles appear unremarkable.  Other: No evidence of abdominal wall mass or hernia. No ascites.  Musculoskeletal: No acute or significant osseous findings. Stable mild lumbar spondylosis.  IMPRESSION: 1. No acute findings or explanation for the patient's symptoms. 2.  Interval removal of right ureteral stent. No evidence of urinary tract calculus or hydronephrosis. 3. Cholelithiasis without evidence of cholecystitis or biliary dilatation. 4. Stable bilateral renal cysts. 5. Aortic Atherosclerosis (ICD10-I70.0).   Electronically Signed   By: Richardean Sale M.D.   On: 10/19/2019 08:38  Within last 24 hrs: No results found.  Assessment    Chronic calculus cholecystitis with biliary colic. Patient Active Problem List   Diagnosis Date Noted  . Acute bacterial endocarditis   . Coronary artery disease   . Hypoalbuminemia 06/29/2019  . Hyperglycemia 06/29/2019  . Lactic acidosis 06/29/2019  . GERD (gastroesophageal reflux disease) 06/29/2019  . 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 Nonrheumatic aortic valve stenosis 05/13/2019  . Hyperlipidemia LDL goal <70 05/13/2019  . Preop cardiovascular exam 05/13/2019  . Bacteremia due to Enterococcus 04/18/2019  . Class 2 severe obesity due to excess calories with serious comorbidity and body mass index (BMI) of 38.0 to 38.9 in adult ([place]) 03/24/2018  . History of aortic valve replacement with bioprosthetic valve 08/07/2016  . Status post coronary artery bypass graft 08/07/2016  . Coronary artery disease of native artery of native heart with stable angina pectoris ([place])  02/11/2016  . SVT (supraventricular tachycardia) ([place]) 07/31/2015  . Type 2 diabetes mellitus without complication, with long-term current use of insulin ([place]) 03/28/2015  . Benign prostatic hyperplasia 04/07/2014  . OSA (obstructive sleep apnea) 04/07/2014  . Essential hypertension 05/16/2009    Plan    After a thorough review of this gentlemen's biliary disease, robotic cholecystectomy and its risks and benefits.  He seems to be well aware of his general health, and the risks of surgery.  He reports he has a few pain pills left, and trusts that they will be sufficient in the interim and would like to take some time to consider any operative intervention. He will be seeing his cardiologist tomorrow, and have asked him to review his risk for elective surgery and general anesthetic in light of this.  Face-to-face time spent with the patient and accompanying care providers(if present) was 35 minutes, with more than 50% of the time spent counseling, educating, and coordinating care of the patient.      Ronny Bacon M.D., FACS 11/01/2019, 10:46 AM

## 2019-11-01 NOTE — Patient Instructions (Addendum)
We will speak with your Cardiologist to see if we can schedule surgery for you.   Call us if you start to have further symptoms and we will schedule you to be seen again.   Cholelithiasis  Cholelithiasis is also called "gallstones." It is a kind of gallbladder disease. The gallbladder is an organ that stores a liquid (bile) that helps you digest fat. Gallstones may not cause symptoms (may be silent gallstones) until they cause a blockage, and then they can cause pain (gallbladder attack). Follow these instructions at home:  Take over-the-counter and prescription medicines only as told by your doctor.  Stay at a healthy weight.  Eat healthy foods. This includes: ? Eating fewer fatty foods, like fried foods. ? Eating fewer refined carbs (refined carbohydrates). Refined carbs are breads and grains that are highly processed, like white bread and white rice. Instead, choose whole grains like whole-wheat bread and brown rice. ? Eating more fiber. Almonds, fresh fruit, and beans are healthy sources of fiber.  Keep all follow-up visits as told by your doctor. This is important. Contact a doctor if:  You have sudden pain in the upper right side of your belly (abdomen). Pain might spread to your right shoulder or your chest. This may be a sign of a gallbladder attack.  You feel sick to your stomach (are nauseous).  You throw up (vomit).  You have been diagnosed with gallstones that have no symptoms and you get: ? Belly pain. ? Discomfort, burning, or fullness in the upper part of your belly (indigestion). Get help right away if:  You have sudden pain in the upper right side of your belly, and it lasts for more than 2 hours.  You have belly pain that lasts for more than 5 hours.  You have a fever or chills.  You keep feeling sick to your stomach or you keep throwing up.  Your skin or the whites of your eyes turn yellow (jaundice).  You have dark-colored pee (urine).  You have  light-colored poop (stool). Summary  Cholelithiasis is also called "gallstones."  The gallbladder is an organ that stores a liquid (bile) that helps you digest fat.  Silent gallstones are gallstones that do not cause symptoms.  A gallbladder attack may cause sudden pain in the upper right side of your belly. Pain might spread to your right shoulder or your chest. If this happens, contact your doctor.  If you have sudden pain in the upper right side of your belly that lasts for more than 2 hours, get help right away. This information is not intended to replace advice given to you by your health care provider. Make sure you discuss any questions you have with your health care provider. Document Revised: 06/12/2017 Document Reviewed: 03/16/2016 Elsevier Patient Education  2020 [employer].

## 2019-11-02 ENCOUNTER — Ambulatory Visit: Payer: Medicare HMO | Admitting: Internal Medicine

## 2019-11-02 ENCOUNTER — Encounter: Payer: Self-pay | Admitting: Internal Medicine

## 2019-11-02 ENCOUNTER — Other Ambulatory Visit
Admission: RE | Admit: 2019-11-02 | Discharge: 2019-11-02 | Disposition: A | Discharge status: Home / Self Care | Payer: Medicare HMO | Attending: Internal Medicine | Admitting: Internal Medicine

## 2019-11-02 ENCOUNTER — Other Ambulatory Visit: Payer: Self-pay

## 2019-11-02 VITALS — BP 100/50 | HR 87 | Ht 69.0 in | Wt 249.4 lb

## 2019-11-02 DIAGNOSIS — I38 Endocarditis, valve unspecified: Secondary | ICD-10-CM | POA: Diagnosis present

## 2019-11-02 DIAGNOSIS — E785 Hyperlipidemia, unspecified: Secondary | ICD-10-CM

## 2019-11-02 DIAGNOSIS — I10 Essential (primary) hypertension: Secondary | ICD-10-CM

## 2019-11-02 DIAGNOSIS — I330 Acute and subacute infective endocarditis: Secondary | ICD-10-CM | POA: Diagnosis not present

## 2019-11-02 DIAGNOSIS — Z01810 Encounter for preprocedural cardiovascular examination: Secondary | ICD-10-CM

## 2019-11-02 DIAGNOSIS — M7989 Other specified soft tissue disorders: Secondary | ICD-10-CM | POA: Insufficient documentation

## 2019-11-02 DIAGNOSIS — I25118 Atherosclerotic heart disease of native coronary artery with other forms of angina pectoris: Secondary | ICD-10-CM

## 2019-11-02 LAB — BASIC METABOLIC PANEL
Anion gap: 8 (ref 5–15)
BUN: 25 mg/dL — ABNORMAL HIGH (ref 8–23)
CO2: 22 mmol/L (ref 22–32)
Calcium: 9.4 mg/dL (ref 8.9–10.3)
Chloride: 105 mmol/L (ref 98–111)
Creatinine, Ser: 0.93 mg/dL (ref 0.61–1.24)
GFR calc Af Amer: >60 mL/min (ref >60)
GFR calc non Af Amer: >60 mL/min (ref >60)
Glucose, Bld: 241 mg/dL — ABNORMAL HIGH (ref 70–99)
Potassium: 4.6 mmol/L (ref 3.5–5.1)
Sodium: 135 mmol/L (ref 135–145)

## 2019-11-02 LAB — CBC WITH DIFFERENTIAL/PLATELET
Abs Immature Granulocytes: 0.05 10*3/uL (ref 0.00–0.07)
Basophils Absolute: 0 10*3/uL (ref 0.0–0.1)
Basophils Relative: 1 %
Eosinophils Absolute: 0.2 10*3/uL (ref 0.0–0.5)
Eosinophils Relative: 4 %
HCT: 36.1 % — ABNORMAL LOW (ref 39.0–52.0)
Hemoglobin: 12.2 g/dL — ABNORMAL LOW (ref 13.0–17.0)
Immature Granulocytes: 1 %
Lymphocytes Relative: 39 %
Lymphs Abs: 1.8 10*3/uL (ref 0.7–4.0)
MCH: 35 pg — ABNORMAL HIGH (ref 26.0–34.0)
MCHC: 33.8 g/dL (ref 30.0–36.0)
MCV: 103.4 fL — ABNORMAL HIGH (ref 80.0–100.0)
Monocytes Absolute: 0.5 10*3/uL (ref 0.1–1.0)
Monocytes Relative: 10 %
Neutro Abs: 2.2 10*3/uL (ref 1.7–7.7)
Neutrophils Relative %: 45 %
Platelets: 164 10*3/uL (ref 150–400)
RBC: 3.49 MIL/uL — ABNORMAL LOW (ref 4.22–5.81)
RDW: 13.6 % (ref 11.5–15.5)
WBC: 4.8 10*3/uL (ref 4.0–10.5)
nRBC: 0 % (ref 0.0–0.2)

## 2019-11-02 NOTE — H&P (View-Only) (Signed)
Follow-up Outpatient Visit Date: 11/02/2019  Primary Care Provider: Georges Mouse, MD [address] [place] [address]  Chief Complaint: Chest pain and left toe swellinig  HPI:  Jonathan Cross is a 74 y.o. 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  He was last seen in our office in February following hospitalization for Enterococcus bacteremia.  TEE at that time showed a 5 x 10 mm mobile echodensity adjacent to the bioprosthetic aortic valve consistent with vegetation.  Per ID recommendations, Jonathan Cross completed 6 weeks of IV ampicillin and ceftriaxone and has remained on suppressive oral therapy with amoxicillin.  Repeat TEE was recommended prior to removal of PICC line, though I do not see that this has been done.  He presented to the ER 2 weeks ago with right flank pain and nausea.  Work-up was done for gallstones.  Jonathan Cross was seen yesterday by Jonathan Cross, who recommended robotic cholecystectomy.  Today, Jonathan Cross is most concerned about swelling of the left lateral 3 toes that has been present for at least a week.  There is no pain but he is concerned that the swelling may be due to uncontrolled blood sugars.  He has not seen his PCP for several months for ongoing management of his DM.  Jonathan Cross continues to have migratory chest pains, that have been present ever since his heart surgery in 2017.  At times the pain is left-sided, though it can also be substernal or right-sided.  It is not exertional and usually only lasts a few seconds.  He is using sublingual NTG about once every 2 weeks with some improvement.  He denies fevers, chills, shortness of breath, palpitations, lightheadedness, and focal neurologic  deficits.Marland Kitchen  He thinks blood cultures may have been drawn within the last month, though he is unsure.  --------------------------------------------------------------------------------------------------  Cardiovascular History & Procedures: Cardiovascular Problems:  Coronary artery disease status post CABG  Aortic stenosis status post bioprosthetic aortic valve replacement  Risk Factors:  No coronary artery disease, hypertension, diabetes mellitus, male gender, age greater than 68  Cath/PCI:  LHC (10/05/2017, [facility]): Significant three-vessel coronary disease with 80% ostial LAD and 70% OM1 stenoses.  50% stenosis of the proximal RPDA 90% lesion of small RPL branch.  Patent LIMA to LAD and SVG to OM.  LHC (02/08/2016): Significant two-vessel CAD with 80% ostial LAD and 90% RPL stenoses (small-caliber).  FFR of ostial LAD 0.80.  LVEDP 12 mmHg.  CV Surgery:  CABG/AVR (02/11/2016, [facility]): LIMA-LAD, SVG-OM, 23 mm bioprosthetic AVR.  EP Procedures and Devices:  None  Non-Invasive Evaluation(s):  TEE (08/17/2019): LVEF 60-65%.  Normal RV size and function.  Normal mitral valve with trivial MR.  Normal tricuspid valve with trivial TR.  Bioprosthetic aortic valve with a 10 x 5 mm mass consistent with vegetation.  No regurgitation.  TTE (08/15/2019): Normal LV size with mild LVH.  LVEF 60-65%.  Normal RV size and function.  TTE (10/12/2017): Technically difficult study.  LVEF 65-70%.  Mitral annular calcification.  Normal aortic prosthetic valve function.  Normal RV function.  Bioprosthetic aortic valve with mean gradient of 15 mmHg.  TTE (01/14/2017): Normal LV size with LVEF 55-60%.  Grade 1 diastolic dysfunction.  Mitral annular calcification.  Aortic valve replacement with mean gradient of  13 mmHg.  Mildly dilated ascending aorta.  Low normal RV systolic function.  Pharmacologic myocardial perfusion stress test (08/01/2015): Normal study without ischemia or scar.  LVEF greater than 65%.  TTE  (07/31/2015): Normal LV size with basal septal hypertrophy.  LVEF 55-60% with grade 1 diastolic dysfunction.  Normal RV size and function.  Mild to moderate aortic stenosis (mean gradient 20 mmHg).  TTE (07/31/2015): Normal LV size with mild LVH.  LVEF 60-65% with grade 1 diastolic dysfunction.  Moderate aortic stenosis (mean gradient 21 mmHg).  Normal RV size and function.  Recent CV Pertinent Labs: Lab Results  Component Value Date   INR 1.0 08/13/2019   K 4.0 10/19/2019   MG 1.7 08/18/2019   BUN 17 10/19/2019   CREATININE 0.69 10/19/2019    Past medical and surgical history were reviewed and updated in EPIC.  Current Meds  Medication Sig  . amLODipine (NORVASC) 5 MG tablet Take 5 mg by mouth daily.  Marland Kitchen amoxicillin (AMOXIL) 500 MG capsule   . Ascorbic Acid (VITAMIN C WITH ROSE HIPS) 500 MG tablet Take 500 mg by mouth daily.  Marland Kitchen aspirin EC 81 MG tablet Take 81 mg by mouth 3 (three) times a week.   Marland Kitchen atorvastatin (LIPITOR) 80 MG tablet Take 80 mg by mouth daily.  . AZO-CRANBERRY PO Take by mouth.  Marland Kitchen glipiZIDE (GLUCOTROL XL) 2.5 MG 24 hr tablet Take 2.5 mg by mouth daily with breakfast.   . insulin aspart (NOVOLOG) 100 UNIT/ML injection Inject 0-5 Units into the skin at bedtime.  . isosorbide mononitrate (IMDUR) 60 MG 24 hr tablet Take 1 tablet (60 mg total) by mouth daily.  Marland Kitchen lisinopril (ZESTRIL) 20 MG tablet Take 1 tablet (20 mg total) by mouth daily. Please call to schedule appointment for further refills. Thank you!  Marland Kitchen loratadine (CLARITIN) 10 MG tablet Take 10 mg by mouth daily.  . metoprolol succinate (TOPROL-XL) 25 MG 24 hr tablet Take 1.5 tablets (37.5 mg total) by mouth daily.  . nitroGLYCERIN (NITROSTAT) 0.4 MG SL tablet Place 0.4 mg under the tongue every 5 (five) minutes as needed for chest pain.   Marland Kitchen omeprazole (PRILOSEC) 20 MG capsule Take 20 mg by mouth daily as needed (acid reflux).   Marland Kitchen oxyCODONE-acetaminophen (PERCOCET) 5-325 MG tablet Take 1 tablet by mouth every 8 (eight)  hours as needed.  . Potassium Citrate 15 MEQ (1620 MG) TBCR Take 2 tablets by mouth 2 (two) times daily.    Allergies: Metformin  Social History   Tobacco Use  . Smoking status: Former Research scientist (life sciences)  . Smokeless tobacco: Never Used  Substance Use Topics  . Alcohol use: Never  . Drug use: Never    Family History  Problem Relation Age of Onset  . Heart attack Father 22    Review of Systems: A 12-system review of systems was performed and was negative except as noted in the HPI.  --------------------------------------------------------------------------------------------------  Physical Exam: BP (!) 100/50 (BP Location: Left Arm, Patient Position: Sitting, Cuff Size: Large)   Pulse 87   Ht 5\' 9"  (1.753 m)   Wt 249 lb 6 oz (113.1 kg)   SpO2 96%   BMI 36.83 kg/m   General:  NAD. Neck: No JVD or HJR. Lungs: CTA bilaterally. CV: RRR with 1/6 systolic murmur.  No rubs or gallops. Abd: Obese and round.  Non-tender. Extremities.  No pretibial edema.  Left 3-5 toes are mildly swollen.  There are chronic skin/nail changes but no ulceration or cellulits.  EKG:  NSR with septal Q waves and lateral T-wave inversions.  No significant change since prior tracing on 08/26/19.  Lab Results  Component Value Date   WBC 4.5 10/19/2019   HGB 12.6 (L) 10/19/2019   HCT 37.0 (L) 10/19/2019   MCV 103.4 (H) 10/19/2019   PLT 120 (L) 10/19/2019    Lab Results  Component Value Date   NA 129 (L) 10/19/2019   K 4.0 10/19/2019   CL 98 10/19/2019   CO2 22 10/19/2019   BUN 17 10/19/2019   CREATININE 0.69 10/19/2019   GLUCOSE 262 (H) 10/19/2019   ALT 47 (H) 10/19/2019   --------------------------------------------------------------------------------------------------  ASSESSMENT AND PLAN: Infective endocarditis: Jonathan Cross reports feeling stable and remains on suppressive oral antibiotics after having completed 6 weeks of IV antibiotics.  Repeat TEE mentioned by ID was never performed.  Given  that he has atypical chest pain that could be due to a number of factors, I think it would be prudent to repeat a TEE to ensure that the previously identified vegetation has not enlarged and that there is not evidence of valve incompetence or developing abscess.  I have discussed the TEE procedure with Jonathan Cross, including its risks and benefits.  We will schedule this at his convenience in the next few weeks.  I will reach out to Jonathan Cross ID providers regarding their thoughts as well.  Coronary artery disease with stable angina: Jonathan Cross has a long history of atypical chest pain present ever since his CABG and AVR in 2017.  Catheterization 2019 showed patent grafts in the setting of severe native CAD.  Myocardial perfusion stress test 5 months ago was normal.  Given that his chest pain has been relatively stable, we will defer additional testing at this time and continue his current antianginal regimen.  EKG is unchanged from prior with evidence of septal MI and lateral T wave inversions.  Hyperlipidemia: Continue atorvastatin 80 mg daily.  Hypertension: Blood pressure borderline low today, albeit without symptoms.  We have agreed to defer medication changes at this time.  Left toe swelling: Mild swelling without ulceration or cellulitis noted today.  Jonathan Cross notes that his blood sugars have not been well controlled.  I recommend that he reach out to his PCP today for further evaluation.  Preoperative cardiovascular risk assessment: Jonathan Cross has chronic atypical chest pain that is unchanged from prior visits.  Given his low risk stress test in November, I doubt that he has had significant progression of CAD.  I would advocate for performing TEE, as outlined above, before proceeding with elective gallbladder surgery.  Assuming this is stable or demonstrates resolution of previously noted vegetation, I think it would be reasonable for Jonathan Cross to proceed with cholecystectomy without  additional cardiac testing or intervention.  I would advocate for continuation of low-dose aspirin in the perioperative period, if possible.  Follow-up: Return to clinic in 1 month.  Nelva Bush, MD 11/02/2019 9:16 AM

## 2019-11-02 NOTE — Progress Notes (Signed)
Follow-up Outpatient Visit Date: 11/02/2019  Primary Care Provider: Georges Mouse, MD [address] [place] [address]  Chief Complaint: Chest pain and left toe swellinig  HPI:  Jonathan Cross is a 74 y.o. 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  He was last seen in our office in February following hospitalization for Enterococcus bacteremia.  TEE at that time showed a 5 x 10 mm mobile echodensity adjacent to the bioprosthetic aortic valve consistent with vegetation.  Per ID recommendations, Jonathan Cross completed 6 weeks of IV ampicillin and ceftriaxone and has remained on suppressive oral therapy with amoxicillin.  Repeat TEE was recommended prior to removal of PICC line, though I do not see that this has been done.  He presented to the ER 2 weeks ago with right flank pain and nausea.  Work-up was done for gallstones.  Jonathan Cross was seen yesterday by Jonathan Cross, who recommended robotic cholecystectomy.  Today, Jonathan Cross is most concerned about swelling of the left lateral 3 toes that has been present for at least a week.  There is no pain but he is concerned that the swelling may be due to uncontrolled blood sugars.  He has not seen his PCP for several months for ongoing management of his DM.  Jonathan Cross continues to have migratory chest pains, that have been present ever since his heart surgery in 2017.  At times the pain is left-sided, though it can also be substernal or right-sided.  It is not exertional and usually only lasts a few seconds.  He is using sublingual NTG about once every 2 weeks with some improvement.  He denies fevers, chills, shortness of breath, palpitations, lightheadedness, and focal neurologic  deficits.Marland Kitchen  He thinks blood cultures may have been drawn within the last month, though he is unsure.  --------------------------------------------------------------------------------------------------  Cardiovascular History & Procedures: Cardiovascular Problems:  Coronary artery disease status post CABG  Aortic stenosis status post bioprosthetic aortic valve replacement  Risk Factors:  No coronary artery disease, hypertension, diabetes mellitus, male gender, age greater than 59  Cath/PCI:  LHC (10/05/2017, [facility]): Significant three-vessel coronary disease with 80% ostial LAD and 70% OM1 stenoses.  50% stenosis of the proximal RPDA 90% lesion of small RPL branch.  Patent LIMA to LAD and SVG to OM.  LHC (02/08/2016): Significant two-vessel CAD with 80% ostial LAD and 90% RPL stenoses (small-caliber).  FFR of ostial LAD 0.80.  LVEDP 12 mmHg.  CV Surgery:  CABG/AVR (02/11/2016, [facility]): LIMA-LAD, SVG-OM, 23 mm bioprosthetic AVR.  EP Procedures and Devices:  None  Non-Invasive Evaluation(s):  TEE (08/17/2019): LVEF 60-65%.  Normal RV size and function.  Normal mitral valve with trivial MR.  Normal tricuspid valve with trivial TR.  Bioprosthetic aortic valve with a 10 x 5 mm mass consistent with vegetation.  No regurgitation.  TTE (08/15/2019): Normal LV size with mild LVH.  LVEF 60-65%.  Normal RV size and function.  TTE (10/12/2017): Technically difficult study.  LVEF 65-70%.  Mitral annular calcification.  Normal aortic prosthetic valve function.  Normal RV function.  Bioprosthetic aortic valve with mean gradient of 15 mmHg.  TTE (01/14/2017): Normal LV size with LVEF 55-60%.  Grade 1 diastolic dysfunction.  Mitral annular calcification.  Aortic valve replacement with mean gradient of  13 mmHg.  Mildly dilated ascending aorta.  Low normal RV systolic function.  Pharmacologic myocardial perfusion stress test (08/01/2015): Normal study without ischemia or scar.  LVEF greater than 65%.  TTE  (07/31/2015): Normal LV size with basal septal hypertrophy.  LVEF 55-60% with grade 1 diastolic dysfunction.  Normal RV size and function.  Mild to moderate aortic stenosis (mean gradient 20 mmHg).  TTE (07/31/2015): Normal LV size with mild LVH.  LVEF 60-65% with grade 1 diastolic dysfunction.  Moderate aortic stenosis (mean gradient 21 mmHg).  Normal RV size and function.  Recent CV Pertinent Labs: Lab Results  Component Value Date   INR 1.0 08/13/2019   K 4.0 10/19/2019   MG 1.7 08/18/2019   BUN 17 10/19/2019   CREATININE 0.69 10/19/2019    Past medical and surgical history were reviewed and updated in EPIC.  Current Meds  Medication Sig  . amLODipine (NORVASC) 5 MG tablet Take 5 mg by mouth daily.  Marland Kitchen amoxicillin (AMOXIL) 500 MG capsule   . Ascorbic Acid (VITAMIN C WITH ROSE HIPS) 500 MG tablet Take 500 mg by mouth daily.  Marland Kitchen aspirin EC 81 MG tablet Take 81 mg by mouth 3 (three) times a week.   Marland Kitchen atorvastatin (LIPITOR) 80 MG tablet Take 80 mg by mouth daily.  . AZO-CRANBERRY PO Take by mouth.  Marland Kitchen glipiZIDE (GLUCOTROL XL) 2.5 MG 24 hr tablet Take 2.5 mg by mouth daily with breakfast.   . insulin aspart (NOVOLOG) 100 UNIT/ML injection Inject 0-5 Units into the skin at bedtime.  . isosorbide mononitrate (IMDUR) 60 MG 24 hr tablet Take 1 tablet (60 mg total) by mouth daily.  Marland Kitchen lisinopril (ZESTRIL) 20 MG tablet Take 1 tablet (20 mg total) by mouth daily. Please call to schedule appointment for further refills. Thank you!  Marland Kitchen loratadine (CLARITIN) 10 MG tablet Take 10 mg by mouth daily.  . metoprolol succinate (TOPROL-XL) 25 MG 24 hr tablet Take 1.5 tablets (37.5 mg total) by mouth daily.  . nitroGLYCERIN (NITROSTAT) 0.4 MG SL tablet Place 0.4 mg under the tongue every 5 (five) minutes as needed for chest pain.   Marland Kitchen omeprazole (PRILOSEC) 20 MG capsule Take 20 mg by mouth daily as needed (acid reflux).   Marland Kitchen oxyCODONE-acetaminophen (PERCOCET) 5-325 MG tablet Take 1 tablet by mouth every 8 (eight)  hours as needed.  . Potassium Citrate 15 MEQ (1620 MG) TBCR Take 2 tablets by mouth 2 (two) times daily.    Allergies: Metformin  Social History   Tobacco Use  . Smoking status: Former Research scientist (life sciences)  . Smokeless tobacco: Never Used  Substance Use Topics  . Alcohol use: Never  . Drug use: Never    Family History  Problem Relation Age of Onset  . Heart attack Father 12    Review of Systems: A 12-system review of systems was performed and was negative except as noted in the HPI.  --------------------------------------------------------------------------------------------------  Physical Exam: BP (!) 100/50 (BP Location: Left Arm, Patient Position: Sitting, Cuff Size: Large)   Pulse 87   Ht 5\' 9"  (1.753 m)   Wt 249 lb 6 oz (113.1 kg)   SpO2 96%   BMI 36.83 kg/m   General:  NAD. Neck: No JVD or HJR. Lungs: CTA bilaterally. CV: RRR with 1/6 systolic murmur.  No rubs or gallops. Abd: Obese and round.  Non-tender. Extremities.  No pretibial edema.  Left 3-5 toes are mildly swollen.  There are chronic skin/nail changes but no ulceration or cellulits.  EKG:  NSR with septal Q waves and lateral T-wave inversions.  No significant change since prior tracing on 08/26/19.  Lab Results  Component Value Date   WBC 4.5 10/19/2019   HGB 12.6 (L) 10/19/2019   HCT 37.0 (L) 10/19/2019   MCV 103.4 (H) 10/19/2019   PLT 120 (L) 10/19/2019    Lab Results  Component Value Date   NA 129 (L) 10/19/2019   K 4.0 10/19/2019   CL 98 10/19/2019   CO2 22 10/19/2019   BUN 17 10/19/2019   CREATININE 0.69 10/19/2019   GLUCOSE 262 (H) 10/19/2019   ALT 47 (H) 10/19/2019   --------------------------------------------------------------------------------------------------  ASSESSMENT AND PLAN: Infective endocarditis: Jonathan Cross reports feeling stable and remains on suppressive oral antibiotics after having completed 6 weeks of IV antibiotics.  Repeat TEE mentioned by ID was never performed.  Given  that he has atypical chest pain that could be due to a number of factors, I think it would be prudent to repeat a TEE to ensure that the previously identified vegetation has not enlarged and that there is not evidence of valve incompetence or developing abscess.  I have discussed the TEE procedure with Jonathan Cross, including its risks and benefits.  We will schedule this at his convenience in the next few weeks.  I will reach out to Jonathan Cross ID providers regarding their thoughts as well.  Coronary artery disease with stable angina: Jonathan Cross has a long history of atypical chest pain present ever since his CABG and AVR in 2017.  Catheterization 2019 showed patent grafts in the setting of severe native CAD.  Myocardial perfusion stress test 5 months ago was normal.  Given that his chest pain has been relatively stable, we will defer additional testing at this time and continue his current antianginal regimen.  EKG is unchanged from prior with evidence of septal MI and lateral T wave inversions.  Hyperlipidemia: Continue atorvastatin 80 mg daily.  Hypertension: Blood pressure borderline low today, albeit without symptoms.  We have agreed to defer medication changes at this time.  Left toe swelling: Mild swelling without ulceration or cellulitis noted today.  Jonathan Cross notes that his blood sugars have not been well controlled.  I recommend that he reach out to his PCP today for further evaluation.  Preoperative cardiovascular risk assessment: Jonathan Cross has chronic atypical chest pain that is unchanged from prior visits.  Given his low risk stress test in November, I doubt that he has had significant progression of CAD.  I would advocate for performing TEE, as outlined above, before proceeding with elective gallbladder surgery.  Assuming this is stable or demonstrates resolution of previously noted vegetation, I think it would be reasonable for Jonathan Cross to proceed with cholecystectomy without  additional cardiac testing or intervention.  I would advocate for continuation of low-dose aspirin in the perioperative period, if possible.  Follow-up: Return to clinic in 1 month.  Nelva Bush, MD 11/02/2019 9:16 AM

## 2019-11-02 NOTE — Patient Instructions (Addendum)
Medication Instructions:  Your physician recommends that you continue on your current medications as directed. Please refer to the Current Medication list given to you today.  *If you need a refill on your cardiac medications before your next appointment, please call your pharmacy*   Lab Work: Your physician recommends that you return for lab work in: [place] at [employer] as you leave. - BMET, CBC - Please go to the [facility]. You will check in at the front desk to the right as you walk into the atrium. Valet Parking is offered if needed. - No appointment needed.  2- COVID PRE- TEST: You will need a COVID TEST prior to the procedure:  LOCATION: [address] [facility] Testing site.  DATE/TIME:  _____Friday, November 11, 2019 sometime between 8 am and 1 pm._____  If you have labs (blood work) drawn today and your tests are completely normal, you will receive your results only by: Marland Kitchen MyChart Message (if you have MyChart) OR . A paper copy in the mail If you have any lab test that is abnormal or we need to change your treatment, we will call you to review the results.   Testing/Procedures: Your physician has requested that you have a TEE. During a TEE, sound waves are used to create images of your heart. It provides your doctor with information about the size and shape of your heart and how well your heart's chambers and valves are working. In this test, a transducer is attached to the end of a flexible tube that's guided down your throat and into your esophagus (the tube leading from you mouth to your stomach) to get a more detailed image of your heart. You are not awake for the procedure. Please see the instruction sheet given to you today. For further information please visit [email or web address].   You are scheduled for a TEE on __05/04/2021_____ with Dr.__END___ Please arrive at the [place] [facility] at __7:30__ a.m. on the day of your procedure.  DIET  INSTRUCTIONS:  Nothing to eat or drink after midnight except your medications listed below with a small  sip of water.         1) Labs: ____TODAY at [facility]________  2) Medications:  YOU MAY TAKE ALL of your remaining medications EXCEPT your GLIPIZIDE AND NOVOLOG AND NOVOLIN 70/30 with a small amount of water. - The night before the procedure take 1/2 your normal dosage of Novolon 70/30  3) Must have a responsible person to drive you home.  4) Bring a current list of your medications and current insurance cards.    If you have any questions after you get home, please call the office at [phone]   Follow-Up: At [facility], you and your health needs are our priority.  As part of our continuing mission to provide you with exceptional heart care, we have created designated Provider Care Teams.  These Care Teams include your primary Cardiologist (physician) and Advanced Practice Providers (APPs -  Physician Assistants and Nurse Practitioners) who all work together to provide you with the care you need, when you need it.  We recommend signing up for the patient portal called "MyChart".  Sign up information is provided on this After Visit Summary.  MyChart is used to connect with patients for Virtual Visits (Telemedicine).  Patients are able to view lab/test results, encounter notes, upcoming appointments, etc.  Non-urgent messages can be sent to your provider as well.   To learn more  about what you can do with MyChart, go to [email or web address].    Your next appointment:   1 month(s) with APP  The format for your next appointment:   In Person  Provider:    You may see one of the following Advanced Practice Providers on your designated Care Team:    Murray Hodgkins, NP  Christell Faith, PA-C  Marrianne Mood, PA-C    Transesophageal Echocardiogram Transesophageal echocardiogram (TEE) is a test that uses sound waves to take pictures of your heart. TEE is done by passing  a flexible tube down the esophagus. The esophagus is the tube that carries food from the throat to the stomach. The pictures give detailed images of your heart. This can help your doctor see if there are problems with your heart. What happens before the procedure? Staying hydrated Follow instructions from your doctor about hydration, which may include:  Up to 3 hours before the procedure - you may continue to drink clear liquids, such as: ? Water. ? Clear fruit juice. ? Black coffee. ? Plain tea.  Eating and drinking Follow instructions from your doctor about eating and drinking, which may include:  8 hours before the procedure - stop eating heavy meals or foods such as meat, fried foods, or fatty foods.  6 hours before the procedure - stop eating light meals or foods, such as toast or cereal.  6 hours before the procedure - stop drinking milk or drinks that contain milk.  3 hours before the procedure - stop drinking clear liquids. General instructions  You will need to take out any dentures or retainers.  Plan to have someone take you home from the hospital or clinic.  If you will be going home right after the procedure, plan to have someone with you for 24 hours.  Ask your doctor about: ? Changing or stopping your normal medicines. This is important if you take diabetes medicines or blood thinners. ? Taking over-the-counter medicines, vitamins, herbs, and supplements. ? Taking medicines such as aspirin and ibuprofen. These medicines can thin your blood. Do not take these medicines unless your doctor tells you to take them. What happens during the procedure?  To lower your risk of infection, your doctors will wash or clean their hands.  An IV will be put into one of your veins.  You will be given a medicine to help you relax (sedative).  A medicine may be sprayed or gargled. This numbs the back of your throat.  Your blood pressure, heart rate, and breathing will be  watched.  You may be asked to lay on your left side.  A bite block will be placed in your mouth. This keeps you from biting the tube.  The tip of the TEE probe will be placed into the back of your mouth.  You will be asked to swallow.  Your doctor will take pictures of your heart.  The probe and bite block will be taken out. The procedure may vary among doctors and hospitals. What happens after the procedure?   Your blood pressure, heart rate, breathing rate, and blood oxygen level will be watched until the medicines you were given have worn off.  When you first wake up, your throat may feel sore and numb. This will get better over time. You will not be allowed to eat or drink until the numbness has gone away.  Do not drive for 24 hours if you were given a medicine to help you relax. Summary  TEE is  a test that uses sound waves to take pictures of your heart.  You will be given a medicine to help you relax.  Do not drive for 24 hours if you were given a medicine to help you relax. This information is not intended to replace advice given to you by your health care provider. Make sure you discuss any questions you have with your health care provider. Document Revised: 03/19/2018 Document Reviewed: 10/01/2016 Elsevier Patient Education  [place].

## 2019-11-08 ENCOUNTER — Encounter: Payer: Self-pay | Admitting: Infectious Diseases

## 2019-11-08 ENCOUNTER — Other Ambulatory Visit
Admission: RE | Admit: 2019-11-08 | Discharge: 2019-11-08 | Disposition: A | Discharge status: Home / Self Care | Payer: Medicare HMO | Source: Ambulatory Visit | Attending: Infectious Diseases | Admitting: Infectious Diseases

## 2019-11-08 ENCOUNTER — Ambulatory Visit: Payer: Medicare HMO | Attending: Infectious Diseases | Admitting: Infectious Diseases

## 2019-11-08 ENCOUNTER — Other Ambulatory Visit: Payer: Self-pay

## 2019-11-08 VITALS — BP 152/79 | HR 141 | Temp 97.8°F | Resp 16 | Ht 69.0 in | Wt 246.0 lb

## 2019-11-08 DIAGNOSIS — Z951 Presence of aortocoronary bypass graft: Secondary | ICD-10-CM | POA: Insufficient documentation

## 2019-11-08 DIAGNOSIS — I25810 Atherosclerosis of coronary artery bypass graft(s) without angina pectoris: Secondary | ICD-10-CM | POA: Insufficient documentation

## 2019-11-08 DIAGNOSIS — I330 Acute and subacute infective endocarditis: Secondary | ICD-10-CM | POA: Insufficient documentation

## 2019-11-08 DIAGNOSIS — E785 Hyperlipidemia, unspecified: Secondary | ICD-10-CM | POA: Diagnosis not present

## 2019-11-08 DIAGNOSIS — Z952 Presence of prosthetic heart valve: Secondary | ICD-10-CM | POA: Diagnosis not present

## 2019-11-08 DIAGNOSIS — Z7902 Long term (current) use of antithrombotics/antiplatelets: Secondary | ICD-10-CM | POA: Insufficient documentation

## 2019-11-08 DIAGNOSIS — R238 Other skin changes: Secondary | ICD-10-CM | POA: Insufficient documentation

## 2019-11-08 DIAGNOSIS — Z7982 Long term (current) use of aspirin: Secondary | ICD-10-CM | POA: Insufficient documentation

## 2019-11-08 DIAGNOSIS — B353 Tinea pedis: Secondary | ICD-10-CM | POA: Insufficient documentation

## 2019-11-08 DIAGNOSIS — B354 Tinea corporis: Secondary | ICD-10-CM | POA: Insufficient documentation

## 2019-11-08 DIAGNOSIS — E119 Type 2 diabetes mellitus without complications: Secondary | ICD-10-CM | POA: Insufficient documentation

## 2019-11-08 DIAGNOSIS — Z794 Long term (current) use of insulin: Secondary | ICD-10-CM | POA: Insufficient documentation

## 2019-11-08 DIAGNOSIS — Z792 Long term (current) use of antibiotics: Secondary | ICD-10-CM | POA: Insufficient documentation

## 2019-11-08 DIAGNOSIS — I10 Essential (primary) hypertension: Secondary | ICD-10-CM | POA: Diagnosis not present

## 2019-11-08 DIAGNOSIS — B952 Enterococcus as the cause of diseases classified elsewhere: Secondary | ICD-10-CM | POA: Insufficient documentation

## 2019-11-08 DIAGNOSIS — Z79899 Other long term (current) drug therapy: Secondary | ICD-10-CM | POA: Diagnosis not present

## 2019-11-08 LAB — COMPREHENSIVE METABOLIC PANEL
ALT: 44 U/L (ref 0–44)
AST: 34 U/L (ref 15–41)
Albumin: 4.1 g/dL (ref 3.5–5.0)
Alkaline Phosphatase: 53 U/L (ref 38–126)
Anion gap: 7 (ref 5–15)
BUN: 18 mg/dL (ref 8–23)
CO2: 24 mmol/L (ref 22–32)
Calcium: 9.2 mg/dL (ref 8.9–10.3)
Chloride: 104 mmol/L (ref 98–111)
Creatinine, Ser: 0.72 mg/dL (ref 0.61–1.24)
GFR calc Af Amer: >60 mL/min (ref >60)
GFR calc non Af Amer: >60 mL/min (ref >60)
Glucose, Bld: 237 mg/dL — ABNORMAL HIGH (ref 70–99)
Potassium: 4.4 mmol/L (ref 3.5–5.1)
Sodium: 135 mmol/L (ref 135–145)
Total Bilirubin: 0.8 mg/dL (ref 0.3–1.2)
Total Protein: 7.8 g/dL (ref 6.5–8.1)

## 2019-11-08 LAB — CBC WITH DIFFERENTIAL/PLATELET
Abs Immature Granulocytes: 0.03 10*3/uL (ref 0.00–0.07)
Basophils Absolute: 0 10*3/uL (ref 0.0–0.1)
Basophils Relative: 1 %
Eosinophils Absolute: 0.2 10*3/uL (ref 0.0–0.5)
Eosinophils Relative: 4 %
HCT: 36.5 % — ABNORMAL LOW (ref 39.0–52.0)
Hemoglobin: 12.2 g/dL — ABNORMAL LOW (ref 13.0–17.0)
Immature Granulocytes: 1 %
Lymphocytes Relative: 38 %
Lymphs Abs: 1.6 10*3/uL (ref 0.7–4.0)
MCH: 34.4 pg — ABNORMAL HIGH (ref 26.0–34.0)
MCHC: 33.4 g/dL (ref 30.0–36.0)
MCV: 102.8 fL — ABNORMAL HIGH (ref 80.0–100.0)
Monocytes Absolute: 0.4 10*3/uL (ref 0.1–1.0)
Monocytes Relative: 10 %
Neutro Abs: 2 10*3/uL (ref 1.7–7.7)
Neutrophils Relative %: 46 %
Platelets: 138 10*3/uL — ABNORMAL LOW (ref 150–400)
RBC: 3.55 MIL/uL — ABNORMAL LOW (ref 4.22–5.81)
RDW: 13.7 % (ref 11.5–15.5)
WBC: 4.3 10*3/uL (ref 4.0–10.5)
nRBC: 0 % (ref 0.0–0.2)

## 2019-11-08 LAB — SEDIMENTATION RATE: Sed Rate: 50 mm/hr — ABNORMAL HIGH (ref 0–20)

## 2019-11-08 MED ORDER — AMOXICILLIN 500 MG PO CAPS: 500.0000 mg | ORAL_CAPSULE | Freq: Two times a day (BID) | ORAL | 5 refills | Status: DC

## 2019-11-08 MED ORDER — TERBINAFINE HCL 1 % EX CREA: 1.0000 "application " | TOPICAL_CREAM | Freq: Two times a day (BID) | CUTANEOUS | 3 refills | Status: DC

## 2019-11-08 NOTE — Progress Notes (Signed)
NAME: Jonathan Cross.  DOB: [birth year]  MRN: [id number]  Date/Time: 11/08/2019 9:13 AM  Subjective:  Follow-up visit  ? Jonathan Cross. is a 74 y.o. male with a history of Enterococcus bacteremia aortic valve replacement, recent aortic valve endocarditis, renal stones, status post multiple procedures for the stone CABG is here for follow-up after recently completing 6 weeks of IV antibiotics for aortic valve endocarditis and currently on oral amoxicillin indefinitely. Patient is to undergo repeat TEE. Patient has been doing well. No fever Has some shortness of breath on exertion. Has gone back to work Has his own business of clearing metal.  Patient has a complicated infectious and urological history.  In October 2020 he was first diagnosed with Enterococcus bacteremia secondary to urinary tract infection.  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  During that hospitalization had a TEE which ruled out prosthetic valve endocarditis.  At that time he completed 2 weeks of IV antibiotic ampicillin.  On May 12, 2019 he had right ureteroscopy, laser lithotripsy and stent placement as a staged procedure.  He was treated with Levaquin for a week after that.  Then in May 27, 2019 he underwent cystoscopy and laser lithotripsy and about 50 to 60% staghorn stone was dusted.  He also had an uncomplicated ureteral stent placement.  Then in December he went for a second stage procedure underwent further lasering of stone.  On June 26, 2019 he presented to the hospital with fever and was diagnosed with Enterococcus in the blood.  Urine culture was negative that time.  A CT scan had showed a right ureteral stent and few residual nonobstructing calculi in the right kidney.  During that hospitalization another TEE was done which was negative for endocarditis.  He completed about 3 weeks of IV antibiotics.  And started on p.o. ampicillin. Then  in August 11, 2019 he had another fever and and blood culture was sent and was positive for Enterococcus and patient was admitted and started on IV ampicillin and  IV gentamicin and a TEE done on 08/17/2019 revealed a bioprosthetic valve with a 10 x 5 mm mobile mass noted on the aortic valve prosthesis.  He was discharged home on IV ampicillin 2 g every 4 hours given as a continuous infusion along with ceftriaxone 2 g every 12 hours for 6 weeks and he completed it on 09/27/2019.  After that he was put on p.o. amoxicillin 500 mg twice a day.  He had gone to the emergency department on April 7 with right flank pain and a CAT scan was done that did not reveal any renal stones.  Multiple stones were seen in the gallbladder.  He was asked to get cardiac clearance for robotic cholecystectomy and saw Dr. END on 11/02/2019 and there is a plan to repeat his TEE.  Today he is seeing me for follow-up of endocarditis and also to get a blood culture.  Complains of a rash on his abdomen which has been present for couple of years and also a rash on the top of both his feet.  Used to apply topical Lamisil for this before and it cleared it He is 100% adherent to amoxicillin.   Past Medical History:  Diagnosis Date  . Coronary artery disease   . Diabetes ([place])   . H/O aortic valve replacement   . History of kidney stones   . Hx of CABG   . Hyperlipidemia   .  Hypertension   . Sleep apnea   Gallstones Past Surgical History:  Procedure Laterality Date  . AORTIC VALVE REPLACEMENT  2017   [facility], bioprosthetic  . APPENDECTOMY    . CARDIAC VALVE REPLACEMENT    . CORONARY ARTERY BYPASS GRAFT  2017   [facility] - [facility] and SVG-OM  . CYSTOSCOPY W/ RETROGRADES Right 05/27/2019   Procedure: CYSTOSCOPY WITH RETROGRADE PYELOGRAM;  Surgeon: Billey Co, MD;  Location: [facility] OR[facility];  Service: Urology;  Laterality: Right;  . CYSTOSCOPY W/ RETROGRADES Right 07/11/2019   Procedure: CYSTOSCOPY WITH RETROGRADE PYELOGRAM;  Surgeon:  Billey Co, MD;  Location: [facility];  Service: Urology;  Laterality: Right;  . CYSTOSCOPY/URETEROSCOPY/HOLMIUM LASER/STENT PLACEMENT Right 05/27/2019   Procedure: CYSTOSCOPY/URETEROSCOPY/HOLMIUM LASER/STENT PLACEMENT;  Surgeon: Billey Co, MD;  Location: [facility];  Service: Urology;  Laterality: Right;  . CYSTOSCOPY/URETEROSCOPY/HOLMIUM LASER/STENT PLACEMENT Right 06/17/2019   Procedure: CYSTOSCOPY/URETEROSCOPY/HOLMIUM LASER/STENT Exchange;  Surgeon: Billey Co, MD;  Location: [facility];  Service: Urology;  Laterality: Right;  . CYSTOSCOPY/URETEROSCOPY/HOLMIUM LASER/STENT PLACEMENT Right 07/11/2019   Procedure: CYSTOSCOPY/URETEROSCOPY/HOLMIUM LASER/STENT Exchange;  Surgeon: Billey Co, MD;  Location: [facility];  Service: Urology;  Laterality: Right;  . STONE EXTRACTION WITH BASKET Right 07/11/2019   Procedure: STONE EXTRACTION WITH BASKET;  Surgeon: Billey Co, MD;  Location: [facility];  Service: Urology;  Laterality: Right;  . TEE WITHOUT CARDIOVERSION N/A 04/21/2019   Procedure: TRANSESOPHAGEAL ECHOCARDIOGRAM (TEE);  Surgeon: Minna Merritts, MD;  Location: [facility];  Service: Cardiovascular;  Laterality: N/A;  . TEE WITHOUT CARDIOVERSION N/A 07/04/2019   Procedure: TRANSESOPHAGEAL ECHOCARDIOGRAM (TEE);  Surgeon: Wellington Hampshire, MD;  Location: [facility];  Service: Cardiovascular;  Laterality: N/A;  . TEE WITHOUT CARDIOVERSION N/A 08/17/2019   Procedure: TRANSESOPHAGEAL ECHOCARDIOGRAM (TEE);  Surgeon: Kate Sable, MD;  Location: [facility];  Service: Cardiovascular;  Laterality: N/A;  . TONSILLECTOMY      Social History   Socioeconomic History  . Marital status: Married    Spouse name: Not on file  . Number of children: Not on file  . Years of education: Not on file  . Highest education level: Not on file  Occupational History  . Not on file  Tobacco Use  . Smoking status: Former Research scientist (life sciences)  . Smokeless tobacco: Never Used  Substance and Sexual Activity  .  Alcohol use: Never  . Drug use: Never  . Sexual activity: Not on file  Other Topics Concern  . Not on file  Social History Narrative  . Not on file   Social Determinants of Health   Financial Resource Strain:   . Difficulty of Paying Living Expenses:   Food Insecurity:   . Worried About Charity fundraiser in the Last Year:   . Arboriculturist in the Last Year:   Transportation Needs:   . Film/video editor (Medical):   Marland Kitchen Lack of Transportation (Non-Medical):   Physical Activity:   . Days of Exercise per Week:   . Minutes of Exercise per Session:   Stress:   . Feeling of Stress :   Social Connections:   . Frequency of Communication with Friends and Family:   . Frequency of Social Gatherings with Friends and Family:   . Attends Religious Services:   . Active Member of Clubs or Organizations:   . Attends Archivist Meetings:   Marland Kitchen Marital Status:   Intimate Partner Violence:   . Fear of Current or Ex-Partner:   . Emotionally Abused:   .  Physically Abused:   . Sexually Abused:     Family History  Problem Relation Age of Onset  . Heart attack Father 40   Allergies  Allergen Reactions  . Metformin Diarrhea    Loose stools even with XR   ? Current Outpatient Medications  Medication Sig Dispense Refill  . amLODipine (NORVASC) 5 MG tablet Take 5 mg by mouth daily.    Marland Kitchen amoxicillin (AMOXIL) 500 MG capsule Take 500 mg by mouth in the morning and at bedtime.     . Ascorbic Acid (VITAMIN C WITH ROSE HIPS) 500 MG tablet Take 1,000 mg by mouth daily.     Marland Kitchen aspirin EC 81 MG tablet Take 81 mg by mouth daily.     Marland Kitchen atorvastatin (LIPITOR) 80 MG tablet Take 80 mg by mouth every evening.     . AZO-CRANBERRY PO Take 1 tablet by mouth daily.     Marland Kitchen glipiZIDE (GLUCOTROL XL) 2.5 MG 24 hr tablet Take 2.5 mg by mouth daily with breakfast.     . insulin aspart (NOVOLOG) 100 UNIT/ML injection Inject 0-5 Units into the skin at bedtime. 10 mL 11  . lisinopril (ZESTRIL) 20 MG  tablet Take 1 tablet (20 mg total) by mouth daily. Please call to schedule appointment for further refills. Thank you! 30 tablet 0  . metoprolol succinate (TOPROL-XL) 25 MG 24 hr tablet Take 1.5 tablets (37.5 mg total) by mouth daily. (Patient taking differently: Take 25 mg by mouth every evening. ) 45 tablet 8  . NOVOLIN 70/30 RELION (70-30) 100 UNIT/ML injection Inject 100 Units into the skin 2 (two) times daily.     . ondansetron (ZOFRAN-ODT) 4 MG disintegrating tablet Take 4 mg by mouth every 8 (eight) hours as needed for nausea or vomiting.    Marland Kitchen oxyCODONE-acetaminophen (PERCOCET) 5-325 MG tablet Take 1 tablet by mouth every 8 (eight) hours as needed. (Patient taking differently: Take 1 tablet by mouth every 8 (eight) hours as needed (pain.). ) 20 tablet 0  . Potassium Citrate 15 MEQ (1620 MG) TBCR Take 2 tablets by mouth 2 (two) times daily. (Patient taking differently: Take 1 tablet by mouth 2 (two) times daily. ) 120 tablet 6  . terbinafine (LAMISIL) 1 % cream Apply 1 application topically 2 (two) times daily as needed (ATHLETE'S FOOT).    . isosorbide mononitrate (IMDUR) 60 MG 24 hr tablet Take 1 tablet (60 mg total) by mouth daily. 90 tablet 3  . loratadine (CLARITIN) 10 MG tablet Take 10 mg by mouth daily.    . nitroGLYCERIN (NITROSTAT) 0.4 MG SL tablet Place 0.4 mg under the tongue every 5 (five) minutes x 3 doses as needed for chest pain.     Marland Kitchen omeprazole (PRILOSEC) 20 MG capsule Take 20 mg by mouth every evening.      No current facility-administered medications for this visit.     Abtx:  Anti-infectives (From admission, onward)   None      REVIEW OF SYSTEMS:  Const: negative fever, negative chills, negative weight loss Eyes: negative diplopia or visual changes, negative eye pain ENT: negative coryza, negative sore throat Resp: negative cough, hemoptysis, dyspnea Cards: Atypical chest pain, palpitations, some lower extremity edema GU: negative for frequency, dysuria and  hematuria GI: Negative for abdominal pain, diarrhea, bleeding, constipation Skin: Has itching and rash Heme: negative for easy bruising and gum/nose bleeding MS: negative for myalgias, arthralgias, back pain and muscle weakness Neurolo:negative for headaches, dizziness, vertigo, memory problems  Psych: negative for  feelings of anxiety, depression  Endocrine: Has diabetes. Allergy/Immunology-Metformin causes diarrhea: Objective:  VITALS:  BP (!) 152/79   Pulse (!) 141   Temp 97.8 F (36.6 C)   Resp 16   Ht 5' 9"  (1.753 m)   Wt 246 lb (111.6 kg)   SpO2 98%   BMI 36.33 kg/m  PHYSICAL EXAM: On checking his pulse it was 92 General: Alert, cooperative, no distress, appears stated age.  Head: Normocephalic, without obvious abnormality, atraumatic. Eyes: Conjunctivae clear, anicteric sclerae. Pupils are equal ENT Nares normal. No drainage or sinus tenderness. Lips, mucosa, and tongue normal. No Thrush Neck: Supple, symmetrical, no adenopathy, thyroid: non tender no carotid bruit and no JVD. Back: No CVA tenderness. Lungs: Bilateral air entry Heart: S1-S2 heart rate 92, 2 x 6 systolic murmur. Abdomen: Soft, obese. Bowel sounds normal. No masses Extremities: Papular scaly rash on top of his feet          Skin: No rashes or lesions. Or bruising Lymph: Cervical, supraclavicular normal. Neurologic: Grossly non-focal Pertinent Labs Lab Results CBC    Component Value Date/Time   WBC 4.8 11/02/2019 1001   RBC 3.49 (L) 11/02/2019 1001   HGB 12.2 (L) 11/02/2019 1001   HCT 36.1 (L) 11/02/2019 1001   PLT 164 11/02/2019 1001   MCV 103.4 (H) 11/02/2019 1001   MCH 35.0 (H) 11/02/2019 1001   MCHC 33.8 11/02/2019 1001   RDW 13.6 11/02/2019 1001   LYMPHSABS 1.8 11/02/2019 1001   MONOABS 0.5 11/02/2019 1001   EOSABS 0.2 11/02/2019 1001   BASOSABS 0.0 11/02/2019 1001    CMP Latest Ref Rng & Units 11/02/2019 10/19/2019 09/07/2019  Glucose 70 - 99 mg/dL 241(H) 262(H) 214(H)  BUN 8 -  23 mg/dL 25(H) 17 21  Creatinine 0.61 - 1.24 mg/dL 0.93 0.69 0.96  Sodium 135 - 145 mmol/L 135 129(L) 136  Potassium 3.5 - 5.1 mmol/L 4.6 4.0 4.5  Chloride 98 - 111 mmol/L 105 98 103  CO2 22 - 32 mmol/L 22 22 23   Calcium 8.9 - 10.3 mg/dL 9.4 8.8(L) 8.8(L)  Total Protein 6.5 - 8.1 g/dL - 7.4 -  Total Bilirubin 0.3 - 1.2 mg/dL - 0.6 -  Alkaline Phos 38 - 126 U/L - 56 -  AST 15 - 41 U/L - 34 -  ALT 0 - 44 U/L - 47(H) -    ? Impression/Recommendation ?Recent Enterococcus bacteremia with aortic valve endocarditis in February 2021.  Was treated with 6 weeks of IV ampicillin and ceftriaxone.  Currently on suppressive amoxicillin 500 mg twice daily. Seen by cardiologist and planning for a repeat TEE. We will send blood cultures today to make sure that there is no bacteremia which I doubt he has now. We will also repeat CBC CMP and ESR. ? ?CAD status post CABG Hypertension Medications include amlodipine, metoprolol and an ACE inhibitor  Hyperlipidemia on atorvastatin  Aortic valve replacement  Diabetes mellitus on insulin and glipizide  History of renal stones with lithotripsy and stent placement in the past  Tinea corporis inguinal abdominal area Tinea pedis Topical Lamisil ointment Possible eczematous rash over the lower back Dry skin Asked him to use moisturizer frequently ___________________________________________________ Discussed with patient, will contact him once we have the culture results. We will continue to take amoxicillin indefinitely. Note:  This document was prepared using Dragon voice recognition software and may include unintentional dictation errors.

## 2019-11-08 NOTE — Patient Instructions (Addendum)
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 For which we will  get a blood culture You have a rash on top of both your feet, belly, groin area- this is fungal infection VS eczema- the former is more likely You also have dry skin with itchy rash on your legs- this is eczema, less likely to be due to antibiotics Use lamisil ( terbanifine) cream for the foot and stomach/groin area You have very dry skin Use moisturizing cream liker shea butter

## 2019-11-11 ENCOUNTER — Other Ambulatory Visit
Admission: RE | Admit: 2019-11-11 | Discharge: 2019-11-11 | Disposition: A | Discharge status: Home / Self Care | Payer: Medicare HMO | Source: Ambulatory Visit | Attending: Internal Medicine | Admitting: Internal Medicine

## 2019-11-11 DIAGNOSIS — Z01812 Encounter for preprocedural laboratory examination: Secondary | ICD-10-CM | POA: Diagnosis present

## 2019-11-11 DIAGNOSIS — Z20822 Contact with and (suspected) exposure to covid-19: Secondary | ICD-10-CM | POA: Diagnosis not present

## 2019-11-11 LAB — SARS CORONAVIRUS 2 (TAT 6-24 HRS): SARS Coronavirus 2: NEGATIVE

## 2019-11-12 DIAGNOSIS — I330 Acute and subacute infective endocarditis: Secondary | ICD-10-CM

## 2019-11-12 HISTORY — DX: Acute and subacute infective endocarditis: I33.0

## 2019-11-13 LAB — CULTURE, BLOOD (SINGLE)
Culture: NO GROWTH
Culture: NO GROWTH
Special Requests: ADEQUATE
Special Requests: ADEQUATE

## 2019-11-15 ENCOUNTER — Encounter: Payer: Self-pay | Admitting: Internal Medicine

## 2019-11-15 ENCOUNTER — Encounter
Admission: RE | Disposition: A | Discharge status: Other Acute Inpatient Hospital | Payer: Self-pay | Source: Home / Self Care | Attending: Internal Medicine

## 2019-11-15 ENCOUNTER — Observation Stay
Admission: RE | Admit: 2019-11-15 | Discharge: 2019-11-15 | Disposition: A | Discharge status: Other Acute Inpatient Hospital | Payer: Medicare HMO | Attending: Internal Medicine | Admitting: Internal Medicine

## 2019-11-15 ENCOUNTER — Inpatient Hospital Stay (HOSPITAL_COMMUNITY)
Admission: AD | Admit: 2019-11-15 | Discharge: 2019-11-18 | DRG: 314 | Disposition: A | Discharge status: Home / Self Care | Payer: Medicare HMO | Source: Other Acute Inpatient Hospital | Attending: Internal Medicine | Admitting: Internal Medicine

## 2019-11-15 ENCOUNTER — Other Ambulatory Visit: Payer: Self-pay

## 2019-11-15 ENCOUNTER — Ambulatory Visit (HOSPITAL_BASED_OUTPATIENT_CLINIC_OR_DEPARTMENT_OTHER)
Admission: RE | Admit: 2019-11-15 | Discharge: 2019-11-15 | Disposition: A | Discharge status: Home / Self Care | Payer: Medicare HMO | Source: Home / Self Care | Attending: Internal Medicine | Admitting: Internal Medicine

## 2019-11-15 DIAGNOSIS — E119 Type 2 diabetes mellitus without complications: Secondary | ICD-10-CM

## 2019-11-15 DIAGNOSIS — G4733 Obstructive sleep apnea (adult) (pediatric): Secondary | ICD-10-CM | POA: Diagnosis present

## 2019-11-15 DIAGNOSIS — Z794 Long term (current) use of insulin: Secondary | ICD-10-CM

## 2019-11-15 DIAGNOSIS — T826XXA Infection and inflammatory reaction due to cardiac valve prosthesis, initial encounter: Principal | ICD-10-CM | POA: Diagnosis present

## 2019-11-15 DIAGNOSIS — Z7982 Long term (current) use of aspirin: Secondary | ICD-10-CM | POA: Insufficient documentation

## 2019-11-15 DIAGNOSIS — I25118 Atherosclerotic heart disease of native coronary artery with other forms of angina pectoris: Secondary | ICD-10-CM | POA: Diagnosis present

## 2019-11-15 DIAGNOSIS — I700 Atherosclerosis of aorta: Secondary | ICD-10-CM | POA: Insufficient documentation

## 2019-11-15 DIAGNOSIS — M7989 Other specified soft tissue disorders: Secondary | ICD-10-CM | POA: Diagnosis not present

## 2019-11-15 DIAGNOSIS — I330 Acute and subacute infective endocarditis: Secondary | ICD-10-CM

## 2019-11-15 DIAGNOSIS — Z79899 Other long term (current) drug therapy: Secondary | ICD-10-CM

## 2019-11-15 DIAGNOSIS — B952 Enterococcus as the cause of diseases classified elsewhere: Secondary | ICD-10-CM | POA: Diagnosis present

## 2019-11-15 DIAGNOSIS — I350 Nonrheumatic aortic (valve) stenosis: Secondary | ICD-10-CM | POA: Insufficient documentation

## 2019-11-15 DIAGNOSIS — I339 Acute and subacute endocarditis, unspecified: Secondary | ICD-10-CM | POA: Diagnosis not present

## 2019-11-15 DIAGNOSIS — I358 Other nonrheumatic aortic valve disorders: Secondary | ICD-10-CM | POA: Diagnosis present

## 2019-11-15 DIAGNOSIS — Y831 Surgical operation with implant of artificial internal device as the cause of abnormal reaction of the patient, or of later complication, without mention of misadventure at the time of the procedure: Secondary | ICD-10-CM | POA: Diagnosis present

## 2019-11-15 DIAGNOSIS — Z952 Presence of prosthetic heart valve: Secondary | ICD-10-CM

## 2019-11-15 DIAGNOSIS — Z953 Presence of xenogenic heart valve: Secondary | ICD-10-CM

## 2019-11-15 DIAGNOSIS — E785 Hyperlipidemia, unspecified: Secondary | ICD-10-CM | POA: Diagnosis present

## 2019-11-15 DIAGNOSIS — I351 Nonrheumatic aortic (valve) insufficiency: Secondary | ICD-10-CM | POA: Diagnosis not present

## 2019-11-15 DIAGNOSIS — K219 Gastro-esophageal reflux disease without esophagitis: Secondary | ICD-10-CM | POA: Diagnosis present

## 2019-11-15 DIAGNOSIS — Z87442 Personal history of urinary calculi: Secondary | ICD-10-CM

## 2019-11-15 DIAGNOSIS — I10 Essential (primary) hypertension: Secondary | ICD-10-CM | POA: Diagnosis present

## 2019-11-15 DIAGNOSIS — I2510 Atherosclerotic heart disease of native coronary artery without angina pectoris: Secondary | ICD-10-CM | POA: Diagnosis not present

## 2019-11-15 DIAGNOSIS — Z87891 Personal history of nicotine dependence: Secondary | ICD-10-CM

## 2019-11-15 DIAGNOSIS — Z8249 Family history of ischemic heart disease and other diseases of the circulatory system: Secondary | ICD-10-CM | POA: Diagnosis not present

## 2019-11-15 DIAGNOSIS — Y712 Prosthetic and other implants, materials and accessory cardiovascular devices associated with adverse incidents: Secondary | ICD-10-CM | POA: Diagnosis present

## 2019-11-15 DIAGNOSIS — R7881 Bacteremia: Secondary | ICD-10-CM | POA: Diagnosis present

## 2019-11-15 DIAGNOSIS — Z79891 Long term (current) use of opiate analgesic: Secondary | ICD-10-CM | POA: Diagnosis not present

## 2019-11-15 DIAGNOSIS — Z888 Allergy status to other drugs, medicaments and biological substances status: Secondary | ICD-10-CM

## 2019-11-15 DIAGNOSIS — Z951 Presence of aortocoronary bypass graft: Secondary | ICD-10-CM | POA: Insufficient documentation

## 2019-11-15 DIAGNOSIS — B354 Tinea corporis: Secondary | ICD-10-CM

## 2019-11-15 DIAGNOSIS — I38 Endocarditis, valve unspecified: Secondary | ICD-10-CM | POA: Diagnosis present

## 2019-11-15 HISTORY — PX: TEE WITHOUT CARDIOVERSION: SHX5443

## 2019-11-15 LAB — CBC
HCT: 36.3 % — ABNORMAL LOW (ref 39.0–52.0)
HCT: 39.1 % (ref 39.0–52.0)
Hemoglobin: 12.4 g/dL — ABNORMAL LOW (ref 13.0–17.0)
Hemoglobin: 13 g/dL (ref 13.0–17.0)
MCH: 35.2 pg — ABNORMAL HIGH (ref 26.0–34.0)
MCH: 35.2 pg — ABNORMAL HIGH (ref 26.0–34.0)
MCHC: 34.2 g/dL (ref 30.0–36.0)
MCHC: 33.2 g/dL (ref 30.0–36.0)
MCV: 103.1 fL — ABNORMAL HIGH (ref 80.0–100.0)
MCV: 106 fL — ABNORMAL HIGH (ref 80.0–100.0)
Platelets: 126 10*3/uL — ABNORMAL LOW (ref 150–400)
Platelets: 141 10*3/uL — ABNORMAL LOW (ref 150–400)
RBC: 3.52 MIL/uL — ABNORMAL LOW (ref 4.22–5.81)
RBC: 3.69 MIL/uL — ABNORMAL LOW (ref 4.22–5.81)
RDW: 14.7 % (ref 11.5–15.5)
RDW: 14.7 % (ref 11.5–15.5)
WBC: 4.8 10*3/uL (ref 4.0–10.5)
WBC: 7.6 10*3/uL (ref 4.0–10.5)
nRBC: 0 % (ref 0.0–0.2)
nRBC: 0 % (ref 0.0–0.2)

## 2019-11-15 LAB — CREATININE, SERUM
Creatinine, Ser: 0.79 mg/dL (ref 0.61–1.24)
Creatinine, Ser: 0.76 mg/dL (ref 0.61–1.24)
GFR calc Af Amer: >60 mL/min (ref >60)
GFR calc Af Amer: >60 mL/min (ref >60)
GFR calc non Af Amer: >60 mL/min (ref >60)
GFR calc non Af Amer: >60 mL/min (ref >60)

## 2019-11-15 LAB — TROPONIN I (HIGH SENSITIVITY): Troponin I (High Sensitivity): 6 ng/L (ref <18)

## 2019-11-15 LAB — GLUCOSE, CAPILLARY
Glucose-Capillary: 168 mg/dL — ABNORMAL HIGH (ref 70–99)
Glucose-Capillary: 233 mg/dL — ABNORMAL HIGH (ref 70–99)

## 2019-11-15 LAB — BRAIN NATRIURETIC PEPTIDE: B Natriuretic Peptide: 37 pg/mL (ref 0.0–100.0)

## 2019-11-15 LAB — SEDIMENTATION RATE: Sed Rate: 33 mm/hr — ABNORMAL HIGH (ref 0–20)

## 2019-11-15 LAB — C-REACTIVE PROTEIN: CRP: 0.5 mg/dL (ref <1.0)

## 2019-11-15 SURGERY — ECHOCARDIOGRAM, TRANSESOPHAGEAL
Anesthesia: Moderate Sedation

## 2019-11-15 MED ORDER — ONDANSETRON HCL 4 MG/2ML IJ SOLN: 4.0000 mg | Freq: Four times a day (QID) | INTRAMUSCULAR | Status: DC | PRN

## 2019-11-15 MED ORDER — GLIPIZIDE 5 MG PO TABS
2.5000 mg | ORAL_TABLET | Freq: Every day | ORAL | Status: DC
  Administered 2019-11-17 – 2019-11-18 (×2): 2.5 mg via ORAL
  Filled 2019-11-15 (×2): qty 0.5
  Filled 2019-11-15: qty 1
  Filled 2019-11-15: qty 0.5

## 2019-11-15 MED ORDER — MIDAZOLAM HCL 5 MG/5ML IJ SOLN
INTRAMUSCULAR | Status: AC
  Filled 2019-11-15: qty 5

## 2019-11-15 MED ORDER — NITROGLYCERIN 0.4 MG SL SUBL: 0.4000 mg | SUBLINGUAL_TABLET | SUBLINGUAL | Status: DC | PRN

## 2019-11-15 MED ORDER — INSULIN ASPART 100 UNIT/ML ~~LOC~~ SOLN: 0.0000 [IU] | Freq: Every day | SUBCUTANEOUS | Status: DC

## 2019-11-15 MED ORDER — OXYCODONE-ACETAMINOPHEN 5-325 MG PO TABS: 1.0000 | ORAL_TABLET | Freq: Three times a day (TID) | ORAL | Status: DC | PRN

## 2019-11-15 MED ORDER — ATORVASTATIN CALCIUM 80 MG PO TABS
80.0000 mg | ORAL_TABLET | Freq: Every day | ORAL | Status: DC
  Administered 2019-11-16 – 2019-11-18 (×3): 80 mg via ORAL
  Filled 2019-11-15 (×3): qty 1

## 2019-11-15 MED ORDER — ISOSORBIDE MONONITRATE ER 60 MG PO TB24: 60.0000 mg | ORAL_TABLET | Freq: Every day | ORAL | Status: DC

## 2019-11-15 MED ORDER — PANTOPRAZOLE SODIUM 40 MG PO TBEC
40.0000 mg | DELAYED_RELEASE_TABLET | Freq: Every day | ORAL | Status: DC
  Administered 2019-11-16 – 2019-11-18 (×3): 40 mg via ORAL
  Filled 2019-11-15 (×3): qty 1

## 2019-11-15 MED ORDER — LISINOPRIL 20 MG PO TABS
20.0000 mg | ORAL_TABLET | Freq: Every day | ORAL | Status: DC
  Administered 2019-11-16 – 2019-11-18 (×3): 20 mg via ORAL
  Filled 2019-11-15 (×3): qty 1

## 2019-11-15 MED ORDER — METOPROLOL SUCCINATE ER 25 MG PO TB24: 25.0000 mg | ORAL_TABLET | Freq: Every evening | ORAL | Status: DC

## 2019-11-15 MED ORDER — MIDAZOLAM HCL 2 MG/2ML IJ SOLN
INTRAMUSCULAR | Status: AC | PRN
  Administered 2019-11-15: 1 mg via INTRAVENOUS
  Administered 2019-11-15: 2 mg via INTRAVENOUS

## 2019-11-15 MED ORDER — FENTANYL CITRATE (PF) 100 MCG/2ML IJ SOLN
INTRAMUSCULAR | Status: AC | PRN
  Administered 2019-11-15 (×2): 25 ug via INTRAVENOUS

## 2019-11-15 MED ORDER — ASPIRIN EC 81 MG PO TBEC: 81.0000 mg | DELAYED_RELEASE_TABLET | Freq: Every day | ORAL | Status: DC

## 2019-11-15 MED ORDER — AMOXICILLIN 500 MG PO CAPS
500.0000 mg | ORAL_CAPSULE | Freq: Two times a day (BID) | ORAL | Status: DC
  Filled 2019-11-15: qty 1

## 2019-11-15 MED ORDER — HEPARIN SODIUM (PORCINE) 5000 UNIT/ML IJ SOLN: 5000.0000 [IU] | Freq: Three times a day (TID) | INTRAMUSCULAR | Status: DC

## 2019-11-15 MED ORDER — METOPROLOL SUCCINATE ER 25 MG PO TB24
25.0000 mg | ORAL_TABLET | Freq: Every evening | ORAL | Status: DC
  Administered 2019-11-15 – 2019-11-16 (×2): 25 mg via ORAL
  Filled 2019-11-15 (×2): qty 1

## 2019-11-15 MED ORDER — ACETAMINOPHEN 325 MG PO TABS: 650.0000 mg | ORAL_TABLET | ORAL | Status: DC | PRN

## 2019-11-15 MED ORDER — AMLODIPINE BESYLATE 5 MG PO TABS: 5.0000 mg | ORAL_TABLET | Freq: Every day | ORAL | Status: DC

## 2019-11-15 MED ORDER — PANTOPRAZOLE SODIUM 40 MG PO TBEC: 40.0000 mg | DELAYED_RELEASE_TABLET | Freq: Every day | ORAL | Status: DC

## 2019-11-15 MED ORDER — ASPIRIN EC 81 MG PO TBEC
81.0000 mg | DELAYED_RELEASE_TABLET | Freq: Every day | ORAL | Status: DC
  Administered 2019-11-16 – 2019-11-18 (×3): 81 mg via ORAL
  Filled 2019-11-15 (×3): qty 1

## 2019-11-15 MED ORDER — INSULIN ASPART PROT & ASPART (70-30 MIX) 100 UNIT/ML ~~LOC~~ SUSP: 100.0000 [IU] | Freq: Two times a day (BID) | SUBCUTANEOUS | Status: DC

## 2019-11-15 MED ORDER — AMLODIPINE BESYLATE 5 MG PO TABS
5.0000 mg | ORAL_TABLET | Freq: Every day | ORAL | Status: DC
  Administered 2019-11-16 – 2019-11-18 (×3): 5 mg via ORAL
  Filled 2019-11-15 (×3): qty 1

## 2019-11-15 MED ORDER — ASCORBIC ACID 500 MG PO TABS
1000.0000 mg | ORAL_TABLET | Freq: Every day | ORAL | Status: DC
  Administered 2019-11-16 – 2019-11-18 (×3): 1000 mg via ORAL
  Filled 2019-11-15 (×3): qty 2

## 2019-11-15 MED ORDER — ISOSORBIDE MONONITRATE ER 60 MG PO TB24
60.0000 mg | ORAL_TABLET | Freq: Every day | ORAL | Status: DC
  Administered 2019-11-16 – 2019-11-18 (×3): 60 mg via ORAL
  Filled 2019-11-15 (×3): qty 1

## 2019-11-15 MED ORDER — INSULIN ASPART 100 UNIT/ML ~~LOC~~ SOLN
0.0000 [IU] | Freq: Every day | SUBCUTANEOUS | Status: DC
  Administered 2019-11-15: 3 [IU] via SUBCUTANEOUS
  Administered 2019-11-17: 2 [IU] via SUBCUTANEOUS

## 2019-11-15 MED ORDER — LIDOCAINE VISCOUS HCL 2 % MT SOLN
OROMUCOSAL | Status: AC
  Filled 2019-11-15: qty 15

## 2019-11-15 MED ORDER — HEPARIN SODIUM (PORCINE) 5000 UNIT/ML IJ SOLN
5000.0000 [IU] | Freq: Three times a day (TID) | INTRAMUSCULAR | Status: DC
  Administered 2019-11-15 – 2019-11-17 (×6): 5000 [IU] via SUBCUTANEOUS
  Filled 2019-11-15 (×6): qty 1

## 2019-11-15 MED ORDER — GLIPIZIDE ER 2.5 MG PO TB24
2.5000 mg | ORAL_TABLET | Freq: Every day | ORAL | Status: DC
  Filled 2019-11-15: qty 1

## 2019-11-15 MED ORDER — LISINOPRIL 20 MG PO TABS: 20.0000 mg | ORAL_TABLET | Freq: Every day | ORAL | Status: DC

## 2019-11-15 MED ORDER — AMOXICILLIN 500 MG PO CAPS
500.0000 mg | ORAL_CAPSULE | Freq: Two times a day (BID) | ORAL | Status: DC
  Administered 2019-11-15 – 2019-11-17 (×4): 500 mg via ORAL
  Filled 2019-11-15 (×5): qty 1

## 2019-11-15 MED ORDER — FENTANYL CITRATE (PF) 100 MCG/2ML IJ SOLN
INTRAMUSCULAR | Status: AC
  Filled 2019-11-15: qty 2

## 2019-11-15 MED ORDER — ATORVASTATIN CALCIUM 80 MG PO TABS: 80.0000 mg | ORAL_TABLET | Freq: Every evening | ORAL | Status: DC

## 2019-11-15 MED ORDER — INSULIN ASPART PROT & ASPART (70-30 MIX) 100 UNIT/ML ~~LOC~~ SUSP
100.0000 [IU] | Freq: Two times a day (BID) | SUBCUTANEOUS | Status: DC
  Administered 2019-11-16 – 2019-11-18 (×4): 100 [IU] via SUBCUTANEOUS
  Filled 2019-11-15: qty 10

## 2019-11-15 MED ORDER — SODIUM CHLORIDE 0.9 % IV SOLN: INTRAVENOUS | Status: DC

## 2019-11-15 MED ORDER — ASCORBIC ACID 500 MG PO TABS: 1000.0000 mg | ORAL_TABLET | Freq: Every day | ORAL | Status: DC

## 2019-11-15 NOTE — Discharge Summary (Signed)
Discharge Summary    Patient ID: [facility].  MRN: [id number], DOB/AGE: 76/19/1947 76 y.o.  Admit Date: 11/15/2019 Discharge Date: 11/15/2019  Primary Care Provider: Georges Mouse, MD Primary Cardiologist: Dr. Saunders Revel, MD  Discharge Diagnoses    Principal Problem:   Bacterial endocarditis Active Problems:   Bacteremia due to Enterococcus   Coronary artery disease of native artery of native heart with stable angina pectoris [facility])   Essential hypertension   History of aortic valve replacement with bioprosthetic valve   Type 2 diabetes mellitus without complication, with long-term current use of insulin (HCC)   Hyperlipidemia LDL goal <70   Allergies Allergies  Allergen Reactions  . Metformin Diarrhea    Loose stools even with XR     History of Present Illness     CAD status post two-vessel CABG with LIMA to LAD and SVG to OM with bioprosthetic AVR at [facility] on [id number] complicated by endocarditis of the bioprosthetic aortic valve earlier in 2021, DM2, HTN, HLD, tobacco use, GERD, and OSA not on CPAP who presented to [facility] for outpatient TEE.  Mr. Fank previously underwent diagnostic LHC at [facility] in 01/2020 which revealed significant multivessel CAD and severe aortic stenosis.  In the setting, he underwent two-vessel CABG and bioprosthetic AVR on 02/11/2016 at [facility].  Most recent ischemic evaluation via [facility] in 05/2019 for anginal symptoms was a low risk with a normal EF and no evidence of ischemia.  He was admitted to the hospital in 04/2019 with enterococcal bacteremia felt to be UTI in etiology complicated by nephrolithiasis.  Surface echo and TEE at that time did not show any evidence of vegetations.  He was treated with IV antibiotics.  He was readmitted to the hospital in 06/2019 following stage ureteroscopy and laser lithotripsy with stent exchange with recurrent Enterococcus bacteremia.  Again 2D surface echo and TEE showed no evidence of vegetation.  He was  admitted in 07/2019 for a third time with enterococcal bacteremia with TEE on 08/17/2019 demonstrating a 10 x 5 mm mobile mass noted on the prosthetic aortic valve consistent with vegetation.  He was evaluated by ID and again treated with IV antibiotics with recommendation to continue ampicillin and ceftriaxone for a total of 6 weeks and remained on suppressive therapy with oral amoxicillin.  Repeat TEE was recommended prior to removal of PICC line, though it appears this was not completed.  He was seen in the ED on [id number] with biliary colic with recommendation for robotic cholecystectomy by general surgery.  He was evaluated by Dr. Saunders Revel on 11/02/2019 for routine follow-up with continued migratory chest pains that have been present dating back to his surgery in 2017.  He denied any fevers or chills.  He underwent repeat TEE as previously recommended to ensure the previously identified vegetation had not enlarged and that there was no evidence of valvular incompetence or developing abscess.    Hospital Course     Consultants: none   He presented for TEE at [facility] on 11/15/2019 which showed an echodensity on the bioprosthetic aortic valve that appeared less mobile but larger measuring 1.1 x 0.6 cm.  There was then echolucent rim with mild Doppler flow in the 2 o'clock position in the short axis images with perivalvular abscess unable to be excluded.  Initially, plans were to coordinate the patient's care with [facility] however the family requested he be transferred to [facility] for continuity of care.  He was admitted to [facility] while awaiting bed  placement at [facility].   _____________  Discharge Vitals Blood pressure (!) 171/64, pulse 85, temperature 97.9 F (36.6 C), resp. rate 19, height 5\' 9"  (1.753 m), weight 112.1 kg, SpO2 96 %.  Filed Weights   11/15/19 0932 11/15/19 1551  Weight: 113.4 kg 112.1 kg    Labs & Radiologic Studies    CBC Recent Labs    11/15/19 1559  WBC 4.8  HGB 12.4*  HCT 36.3*    MCV 103.1*  PLT 123XX123*   Basic Metabolic Panel Recent Labs    11/15/19 1559  CREATININE 0.79   Liver Function Tests No results for input(s): AST, ALT, ALKPHOS, BILITOT, PROT, ALBUMIN in the last 72 hours. No results for input(s): LIPASE, AMYLASE in the last 72 hours. Cardiac Enzymes No results for input(s): CKTOTAL, CKMB, CKMBINDEX, TROPONINI in the last 72 hours. BNP Invalid input(s): POCBNP D-Dimer No results for input(s): DDIMER in the last 72 hours. Hemoglobin A1C No results for input(s): HGBA1C in the last 72 hours. Fasting Lipid Panel No results for input(s): CHOL, HDL, LDLCALC, TRIG, CHOLHDL, LDLDIRECT in the last 72 hours. Thyroid Function Tests No results for input(s): TSH, T4TOTAL, T3FREE, THYROIDAB in the last 72 hours.  Invalid input(s): FREET3 _____________   Diagnostic Studies/Procedures   TEE 11/15/2019: Left Ventrical:Normal  Mitral Valve:Normal. Trivial MR.  Aortic Valve:Bioprosthetic valve present. Echodensity appears less mobile but larger, measuring 1.1 x 0.6 cm. There is an echolucent rim with mild Doppler flow in the 2 o'clock position in the short axis images; perivalvular abscess cannot be excluded.  Tricuspid Valve:Not well-visualized.  Pulmonic Valve:Not well-visualized.  Left Atrium/ Left atrial appendage:Grossly normal.  Atrial septum:No shunt by color Doppler.  Aorta:Mild plaquing.  Complications:No apparent complications Patientdidtolerate procedure well. _____________  Disposition   Pt is being discharged home today in good condition.  Follow-up Plans & Appointments    Pending hospital course at [facility]. Discharge Instructions    Diet - low sodium heart healthy   Complete by: As directed    Increase activity slowly   Complete by: As directed       Discharge Medications   Allergies as of 11/15/2019      Reactions   Metformin Diarrhea   Loose stools even with XR      Medication List    TAKE these  medications   amLODipine 5 MG tablet Commonly known as: NORVASC Take 5 mg by mouth daily.   amoxicillin 500 MG capsule Commonly known as: AMOXIL Take 1 capsule (500 mg total) by mouth in the morning and at bedtime.   aspirin EC 81 MG tablet Take 81 mg by mouth daily.   atorvastatin 80 MG tablet Commonly known as: LIPITOR Take 80 mg by mouth every evening.   AZO-CRANBERRY PO Take 1 tablet by mouth daily.   glipiZIDE 2.5 MG 24 hr tablet Commonly known as: GLUCOTROL XL Take 2.5 mg by mouth daily with breakfast.   insulin aspart 100 UNIT/ML injection Commonly known as: novoLOG Inject 0-5 Units into the skin at bedtime.   isosorbide mononitrate 60 MG 24 hr tablet Commonly known as: IMDUR Take 1 tablet (60 mg total) by mouth daily.   lisinopril 20 MG tablet Commonly known as: ZESTRIL Take 1 tablet (20 mg total) by mouth daily. Please call to schedule appointment for further refills. Thank you!   loratadine 10 MG tablet Commonly known as: CLARITIN Take 10 mg by mouth daily.   metoprolol succinate 25 MG 24 hr tablet Commonly known as: TOPROL-XL  Take 1.5 tablets (37.5 mg total) by mouth daily. What changed:   how much to take  when to take this   nitroGLYCERIN 0.4 MG SL tablet Commonly known as: NITROSTAT Place 0.4 mg under the tongue every 5 (five) minutes x 3 doses as needed for chest pain.   NovoLIN 70/30 ReliOn (70-30) 100 UNIT/ML injection Generic drug: insulin NPH-regular Human Inject 100 Units into the skin 2 (two) times daily.   omeprazole 20 MG capsule Commonly known as: PRILOSEC Take 20 mg by mouth every evening.   ondansetron 4 MG disintegrating tablet Commonly known as: ZOFRAN-ODT Take 4 mg by mouth every 8 (eight) hours as needed for nausea or vomiting.   oxyCODONE-acetaminophen 5-325 MG tablet Commonly known as: Percocet Take 1 tablet by mouth every 8 (eight) hours as needed. What changed: reasons to take this   Potassium Citrate 15 MEQ (1620  MG) Tbcr Take 2 tablets by mouth 2 (two) times daily. What changed: how much to take   terbinafine 1 % cream Commonly known as: LAMISIL Apply 1 application topically 2 (two) times daily as needed (ATHLETE'S FOOT).   terbinafine 1 % cream Commonly known as: LamISIL AT Apply 1 application topically 2 (two) times daily.   vitamin C with rose hips 500 MG tablet Take 1,000 mg by mouth daily.        Aspirin prescribed at discharge?  Yes High Intensity Statin Prescribed? (Lipitor 40-80mg  or Crestor 20-40mg ): Yes Beta Blocker Prescribed? Yes For EF <40%, was ACEI/ARB Prescribed? No: N/A ADP Receptor Inhibitor Prescribed? (i.e. Plavix etc.-Includes Medically Managed Patients): No: N/A For EF <40%, Aldosterone Inhibitor Prescribed? No: N/A Was EF assessed during THIS hospitalization? Yes Was Cardiac Rehab II ordered? (Included Medically managed Patients): No: N/A, transferred to outside hospital where this will be ordered at formal discharge.   Outstanding Labs/Studies   None.  Duration of Discharge Encounter   Greater than 30 minutes including physician time.  Signed, Rise Mu, PA-C [facility] Pager: [phone] 11/15/2019, 4:51 PM

## 2019-11-15 NOTE — H&P (Signed)
Cardiology Admission History and Physical:   Patient ID: Jonathan Cross. MRN: [id number]; DOB: [birth year]   Admission date: 11/15/2019  Primary Care Provider: Georges Mouse, MD Primary Cardiologist: Nelva Bush, MD  Primary Electrophysiologist:  None   Chief Complaint: Bacterial endocarditis  Patient Profile:   Jonathan Cross. is a 74 y.o. male with CAD status post two-vessel CABG with LIMA to LAD and SVG to OM with bioprosthetic AVR at [facility] on [id number] complicated by endocarditis of the bioprosthetic aortic valve earlier in 2021, DM2, HTN, HLD, tobacco use, GERD, and OSA not on CPAP who presented to [facility] for outpatient TEE.  History of Present Illness:   [name] previously underwent diagnostic LHC at [facility] in 01/2020 which revealed significant multivessel CAD and severe aortic stenosis.  In the setting, he underwent two-vessel CABG and bioprosthetic AVR on 02/11/2016 at [facility].  Most recent ischemic evaluation via [place] [facility] in 05/2019 for anginal symptoms was a low risk with a normal EF and no evidence of ischemia.  He was admitted to the hospital in 04/2019 with enterococcal bacteremia felt to be UTI in etiology complicated by nephrolithiasis.  Surface echo and TEE at that time did not show any evidence of vegetations.  He was treated with IV antibiotics.  He was readmitted to the hospital in 06/2019 following stage ureteroscopy and laser lithotripsy with stent exchange with recurrent Enterococcus bacteremia.  Again 2D surface echo and TEE showed no evidence of vegetation.  He was admitted in 07/2019 for a third time with enterococcal bacteremia with TEE on 08/17/2019 demonstrating a 10 x 5 mm mobile mass noted on the prosthetic aortic valve consistent with vegetation.  He was evaluated by ID and again treated with IV antibiotics with recommendation to continue ampicillin and ceftriaxone for a total of 6 weeks and remained on suppressive therapy with oral amoxicillin.  Repeat  TEE was recommended prior to removal of PICC line, though it appears this was not completed.  He was seen in the ED on [id number] with biliary colic with recommendation for robotic cholecystectomy by general surgery.  He was evaluated by Dr. Saunders Revel on 11/02/2019 for routine follow-up with continued migratory chest pains that have been present dating back to his surgery in 2017.  He denied any fevers or chills.  He underwent repeat TEE as previously recommended to ensure the previously identified vegetation had not enlarged and that there was no evidence of valvular incompetence or developing abscess.  He presented for TEE at [facility] on 11/15/2019 which showed an echodensity on the bioprosthetic aortic valve that appeared less mobile but larger measuring 1.1 x 0.6 cm.  There was then echolucent rim with mild Doppler flow in the 2 o'clock position in the short axis images with perivalvular abscess unable to be excluded.  Initially, plans were to coordinate the patient's care with [facility] however the family requested he be transferred to [facility] for continuity of care.  He will be admitted to [facility] while we await bed placement at [facility].   Past Medical History:  Diagnosis Date  . Coronary artery disease   . Diabetes ([place])   . H/O aortic valve replacement   . History of kidney stones   . Hx of CABG   . Hyperlipidemia   . Hypertension   . Sleep apnea     Past Surgical History:  Procedure Laterality Date  . AORTIC VALVE REPLACEMENT  2017   [facility], bioprosthetic  . APPENDECTOMY    .  CARDIAC VALVE REPLACEMENT    . CORONARY ARTERY BYPASS GRAFT  2017   [facility] - LIMA-LAD and SVG-OM  . CYSTOSCOPY W/ RETROGRADES Right 05/27/2019   Procedure: CYSTOSCOPY WITH RETROGRADE PYELOGRAM;  Surgeon: Billey Co, MD;  Location: [facility] ORS;  Service: Urology;  Laterality: Right;  . CYSTOSCOPY W/ RETROGRADES Right 07/11/2019   Procedure: CYSTOSCOPY WITH RETROGRADE PYELOGRAM;  Surgeon: Billey Co, MD;  Location: [facility] ORS;   Service: Urology;  Laterality: Right;  . CYSTOSCOPY/URETEROSCOPY/HOLMIUM LASER/STENT PLACEMENT Right 05/27/2019   Procedure: CYSTOSCOPY/URETEROSCOPY/HOLMIUM LASER/STENT PLACEMENT;  Surgeon: Billey Co, MD;  Location: [facility] ORS;  Service: Urology;  Laterality: Right;  . CYSTOSCOPY/URETEROSCOPY/HOLMIUM LASER/STENT PLACEMENT Right 06/17/2019   Procedure: CYSTOSCOPY/URETEROSCOPY/HOLMIUM LASER/STENT Exchange;  Surgeon: Billey Co, MD;  Location: [facility] ORS;  Service: Urology;  Laterality: Right;  . CYSTOSCOPY/URETEROSCOPY/HOLMIUM LASER/STENT PLACEMENT Right 07/11/2019   Procedure: CYSTOSCOPY/URETEROSCOPY/HOLMIUM LASER/STENT Exchange;  Surgeon: Billey Co, MD;  Location: [facility] ORS;  Service: Urology;  Laterality: Right;  . STONE EXTRACTION WITH BASKET Right 07/11/2019   Procedure: STONE EXTRACTION WITH BASKET;  Surgeon: Billey Co, MD;  Location: [facility] ORS;  Service: Urology;  Laterality: Right;  . TEE WITHOUT CARDIOVERSION N/A 04/21/2019   Procedure: TRANSESOPHAGEAL ECHOCARDIOGRAM (TEE);  Surgeon: Minna Merritts, MD;  Location: [facility] ORS;  Service: Cardiovascular;  Laterality: N/A;  . TEE WITHOUT CARDIOVERSION N/A 07/04/2019   Procedure: TRANSESOPHAGEAL ECHOCARDIOGRAM (TEE);  Surgeon: Wellington Hampshire, MD;  Location: [facility] ORS;  Service: Cardiovascular;  Laterality: N/A;  . TEE WITHOUT CARDIOVERSION N/A 08/17/2019   Procedure: TRANSESOPHAGEAL ECHOCARDIOGRAM (TEE);  Surgeon: Kate Sable, MD;  Location: [facility] ORS;  Service: Cardiovascular;  Laterality: N/A;  . TONSILLECTOMY       Medications Prior to Admission: Prior to Admission medications   Medication Sig Start Date End Date Taking? Authorizing Provider  amLODipine (NORVASC) 5 MG tablet Take 5 mg by mouth daily. 03/08/19  Yes [provider]  amoxicillin (AMOXIL) 500 MG capsule Take 1 capsule (500 mg total) by mouth in the morning and at bedtime. 11/08/19  Yes Tsosie Billing, MD  Ascorbic Acid (VITAMIN C WITH  ROSE HIPS) 500 MG tablet Take 1,000 mg by mouth daily.    Yes [provider]  aspirin EC 81 MG tablet Take 81 mg by mouth daily.  05/30/14  Yes [provider]  atorvastatin (LIPITOR) 80 MG tablet Take 80 mg by mouth every evening.  08/13/18  Yes [provider]  AZO-CRANBERRY PO Take 1 tablet by mouth daily.    Yes [provider]  glipiZIDE (GLUCOTROL XL) 2.5 MG 24 hr tablet Take 2.5 mg by mouth daily with breakfast.  05/05/19  Yes [provider]  isosorbide mononitrate (IMDUR) 60 MG 24 hr tablet Take 1 tablet (60 mg total) by mouth daily. 05/12/19 11/02/19 Yes End, Harrell Gave, MD  lisinopril (ZESTRIL) 20 MG tablet Take 1 tablet (20 mg total) by mouth daily. Please call to schedule appointment for further refills. Thank you! 08/09/19 05/05/20 Yes End, Harrell Gave, MD  loratadine (CLARITIN) 10 MG tablet Take 10 mg by mouth daily. 09/13/18 11/02/19 Yes [provider]  metoprolol succinate (TOPROL-XL) 25 MG 24 hr tablet Take 1.5 tablets (37.5 mg total) by mouth daily. Patient taking differently: Take 25 mg by mouth every evening.  08/26/19 05/22/20 Yes Loel Dubonnet, NP  nitroGLYCERIN (NITROSTAT) 0.4 MG SL tablet Place 0.4 mg under the tongue every 5 (five) minutes x 3 doses as needed for chest pain.  03/24/18 11/02/19 Yes [provider]  NOVOLIN 70/30 RELION (70-30) 100 UNIT/ML injection Inject 100 Units into the skin 2 (two) times daily.  05/13/19  Yes [provider]  omeprazole (PRILOSEC) 20 MG capsule Take 20 mg by mouth every evening.  04/22/18 11/02/19 Yes [provider]  ondansetron (ZOFRAN-ODT) 4 MG disintegrating tablet Take 4 mg by mouth every 8 (eight) hours as needed for nausea or vomiting.   Yes [provider]  oxyCODONE-acetaminophen (PERCOCET) 5-325 MG tablet Take 1 tablet by mouth every 8 (eight) hours as needed. Patient taking differently: Take 1 tablet by mouth every 8 (eight) hours as needed  (pain.).  10/19/19  Yes Earleen Newport, MD  Potassium Citrate 15 MEQ (1620 MG) TBCR Take 2 tablets by mouth 2 (two) times daily. Patient taking differently: Take 1 tablet by mouth 2 (two) times daily.  07/20/19  Yes Billey Co, MD  terbinafine (LAMISIL AT) 1 % cream Apply 1 application topically 2 (two) times daily. 11/08/19  Yes Tsosie Billing, MD  terbinafine (LAMISIL) 1 % cream Apply 1 application topically 2 (two) times daily as needed (ATHLETE'S FOOT).   Yes [provider]  insulin aspart (NOVOLOG) 100 UNIT/ML injection Inject 0-5 Units into the skin at bedtime. 04/23/19   Epifanio Lesches, MD     Allergies:    Allergies  Allergen Reactions  . Metformin Diarrhea    Loose stools even with XR    Social History:   Social History   Socioeconomic History  . Marital status: Married    Spouse name: Not on file  . Number of children: Not on file  . Years of education: Not on file  . Highest education level: Not on file  Occupational History  . Not on file  Tobacco Use  . Smoking status: Former Research scientist (life sciences)  . Smokeless tobacco: Never Used  Substance and Sexual Activity  . Alcohol use: Never  . Drug use: Never  . Sexual activity: Not on file  Other Topics Concern  . Not on file  Social History Narrative  . Not on file   Social Determinants of Health   Financial Resource Strain:   . Difficulty of Paying Living Expenses:   Food Insecurity:   . Worried About Charity fundraiser in the Last Year:   . Arboriculturist in the Last Year:   Transportation Needs:   . Film/video editor (Medical):   Marland Kitchen Lack of Transportation (Non-Medical):   Physical Activity:   . Days of Exercise per Week:   . Minutes of Exercise per Session:   Stress:   . Feeling of Stress :   Social Connections:   . Frequency of Communication with Friends and Family:   . Frequency of Social Gatherings with Friends and Family:   . Attends Religious Services:   . Active Member of  Clubs or Organizations:   . Attends Archivist Meetings:   Marland Kitchen Marital Status:   Intimate Partner Violence:   . Fear of Current or Ex-Partner:   . Emotionally Abused:   Marland Kitchen Physically Abused:   . Sexually Abused:     Family History:   The patient's family history includes Heart attack (age of onset: 64) in his father.    ROS:  Please see the history of present illness.  All other ROS reviewed and negative.     Physical Exam/Data:   Vitals:   11/15/19 1215 11/15/19 1245 11/15/19 1300 11/15/19 1315  BP: (!) 116/57  (!) 162/79 Marland Kitchen)  146/83  Pulse: 78 82 91 77  Resp: 15 17 (!) 29 19  Temp:      TempSrc:      SpO2: 96% 96% 96% 95%  Weight:      Height:       No intake or output data in the 24 hours ending 11/15/19 1447 Last 3 Weights 11/15/2019 11/08/2019 11/02/2019  Weight (lbs) 250 lb 246 lb 249 lb 6 oz  Weight (kg) 113.399 kg 111.585 kg 113.116 kg     Body mass index is 36.92 kg/m.  General:  Well nourished, well developed, in no acute distress HEENT: Normal Lymph: No adenopathy Neck: No JVD Endocrine:  No thryomegaly Vascular: No carotid bruits; FA pulses 2+ bilaterally without bruits  Cardiac:  Normal S1, S2; RRR; I/VI systolic murmur  Lungs:  Clear to auscultation bilaterally, no wheezing, rhonchi or rales  Abd: Soft, nontender, no hepatomegaly  Ext: No edema Musculoskeletal:  No deformities, BUE and BLE strength normal and equal Skin: Warm and dry  Neuro:  CNs 2-12 intact, no focal abnormalities noted Psych:  Normal affect    EKG:  The ECG that was done 11/02/19 was personally reviewed and demonstrates NSR with septal Q waves and lateral T-wave inversions.  No significant change since prior tracing on 08/26/19  Relevant CV Studies:  TEE 11/15/2019: Left Ventrical:  Normal  Mitral Valve: Normal.  Trivial MR.  Aortic Valve: Bioprosthetic valve present.  Echodensity appears less mobile but larger, measuring 1.1 x 0.6 cm.  There is an echolucent rim with mild  Doppler flow in the 2 o'clock position in the short axis images; perivalvular abscess cannot be excluded.  Tricuspid Valve: Not well-visualized.  Pulmonic Valve: Not well-visualized.  Left Atrium/ Left atrial appendage: Grossly normal.  Atrial septum: No shunt by color Doppler.  Aorta: Mild plaquing.  Complications: No apparent complications Patient did tolerate procedure well.   Laboratory Data:  High Sensitivity Troponin:  No results for input(s): TROPONINIHS in the last 720 hours.    ChemistryNo results for input(s): NA, K, CL, CO2, GLUCOSE, BUN, CREATININE, CALCIUM, GFRNONAA, GFRAA, ANIONGAP in the last 168 hours.  No results for input(s): PROT, ALBUMIN, AST, ALT, ALKPHOS, BILITOT in the last 168 hours. HematologyNo results for input(s): WBC, RBC, HGB, HCT, MCV, MCH, MCHC, RDW, PLT in the last 168 hours. BNPNo results for input(s): BNP, PROBNP in the last 168 hours.  DDimer No results for input(s): DDIMER in the last 168 hours.   Radiology/Studies:  No results found. {  Assessment and Plan:   1. Infective endocarditis: Patient has had 3 hospital admissions for Enterococcus bacteremia with TEE performed earlier today showing a less mobile though enlarging bioprosthetic aortic valve vegetation with the inability to exclude perivalvular abscess.  Recommend he continue suppressive amoxicillin as previously directed by ID.  He will be transferred to [facility] when a bed is available.  Consider coronary CTA to better evaluate for potential perivalvular abscess as well as to evaluate his coronary disease and grafts given his history of atypical migratory chest pain.  Consider ID consult at [facility].  2. CAD with stable angina status post CABG: He has a long history of atypical chest pain that dates back to his CABG and AVR in 01/2016.  Cardiac cath in 2019 showed patent grafts with underlying severe native vessel CAD.  Lexiscan MPI in 05/2019 showed no evidence of ischemia.   Continue secondary prevention with aspirin, Imdur, atorvastatin, metoprolol, and lisinopril.  3. HTN: Continue current  medical therapy including amlodipine, Imdur, lisinopril, and metoprolol.  4. HLD: Continue atorvastatin.  5. Insulin-dependent diabetes: SSI.  Glipizide.  6. GERD: Protonix.  Severity of Illness: The appropriate patient status for this patient is OBSERVATION. Observation status is judged to be reasonable and necessary in order to provide the required intensity of service to ensure the patient's safety. The patient's presenting symptoms, physical exam findings, and initial radiographic and laboratory data in the context of their medical condition is felt to place them at decreased risk for further clinical deterioration. Furthermore, it is anticipated that the patient will be medically stable for discharge from the hospital within 2 midnights of admission. The following factors support the patient status of observation.   " The patient's presenting symptoms include recurrent fever chills in the setting of enterococcal bacteremia. " The physical exam findings include enlarging less mobile bioprosthetic aortic valve vegetation. " The initial radiographic and laboratory data are as above.     For questions or updates, please contact [place] Please consult [email or web address] for contact info under        Signed, Christell Faith, PA-C  11/15/2019 2:47 PM

## 2019-11-15 NOTE — Interval H&P Note (Signed)
History and Physical Interval Note:  11/15/2019 10:49 AM  Jonathan Cross.  has presented today for surgery, with the diagnosis of endocarditis Per Heath Lark OK 2nd add on per Dr Kayleen Memos.  The various methods of treatment have been discussed with the patient and family. After consideration of risks, benefits and other options for treatment, the patient has consented to  Procedure(s): TRANSESOPHAGEAL ECHOCARDIOGRAM (TEE) (N/A) as a surgical intervention.  The patient's history has been reviewed, patient examined, no change in status, stable for surgery.  I have reviewed the patient's chart and labs.  Questions were answered to the patient's satisfaction.     Ronee Ranganathan

## 2019-11-15 NOTE — Progress Notes (Signed)
*  PRELIMINARY RESULTS* Echocardiogram Echocardiogram Transesophageal has been performed.  Sherrie Sport 11/15/2019, 11:34 AM

## 2019-11-15 NOTE — Brief Op Note (Signed)
    Transesophageal Echocardiogram Note  Jonathan Cross [id number] February 18, 1946  Procedure: Transesophageal Echocardiogram Indications: Endocarditis  Procedure Details Consent: Obtained Time Out: Verified patient identification, verified procedure, site/side was marked, verified correct patient position, special equipment/implants available, Radiology Safety Procedures followed,  medications/allergies/relevent history reviewed, required imaging and test results available.  Performed  Medications:  During this procedure the patient is administered a total of Versed 3 mg and Fentanyl 50 mcg  to achieve and maintain moderate conscious sedation.  The patient's heart rate, blood pressure, and oxygen saturation are monitored continuously during the procedure. The period of conscious sedation is 20 minutes, of which I was present face-to-face 100% of this time.  Left Ventrical:  Normal  Mitral Valve: Normal.  Trivial MR.  Aortic Valve: Bioprosthetic valve present.  Echodensity appears less mobile but larger, measuring 1.1 x 0.6 cm.  There is an echolucent rim with mild Doppler flow in the 2 o'clock position in the short axis images; perivalvular abscess cannot be excluded.  Tricuspid Valve: Not well-visualized.  Pulmonic Valve: Not well-visualized.  Left Atrium/ Left atrial appendage: Grossly normal.  Atrial septum: No shunt by color Doppler.  Aorta: Mild plaquing.  Complications: No apparent complications Patient did tolerate procedure well.  Nelva Bush, MD 11/15/2019, 11:55 AM

## 2019-11-15 NOTE — H&P (Addendum)
Cardiology Admission History and Physical:   Patient ID: Jonathan Cross. 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 is a 74 y.o. 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 for outpatient TEE and was found to have an enlarging vegetation on his bioprosthetic aortic valve with recommendation to transfer to [facility] for further evaluation.  History of Present Illness:   Mr. Skarda LHC at [facility] in 01/2020 which revealed significant multivessel CAD and severe aortic stenosis.  In the setting, he underwent two-vessel CABG and bioprosthetic AVR on 02/11/2016 at [facility].  Most recent ischemic evaluation via [facility] in 05/2019 for anginal symptoms was a low risk with a normal EF and no evidence of ischemia.  He was admitted to the hospital in 04/2019 with enterococcal bacteremia felt to be UTI in etiology complicated by nephrolithiasis.  Surface echo and TEE at that time did not show any evidence of vegetations.  He was treated with IV antibiotics.  He was readmitted to the hospital in 06/2019 following stage ureteroscopy and laser lithotripsy with stent exchange with recurrent Enterococcus bacteremia.  Again 2D surface echo and TEE showed no evidence of vegetation.  He was admitted in 07/2019 for a third time with enterococcal bacteremia with TEE on 08/17/2019 demonstrating a 10 x 5 mm mobile mass noted on the prosthetic aortic valve consistent with vegetation.  He was evaluated by ID and again treated with IV  antibiotics with recommendation to continue ampicillin and ceftriaxone for a total of 6 weeks and remained on suppressive therapy with oral amoxicillin.  Repeat TEE was recommended prior to removal of PICC line, though it appears this was not completed.  He was seen in the ED on [id number] with biliary colic with recommendation for robotic cholecystectomy by general surgery.  He was evaluated by Dr. Saunders Revel on 11/02/2019 for routine follow-up with continued migratory chest pains that have been present dating back to his surgery in 2017.  He denied any fevers or chills.  He underwent repeat TEE as previously recommended to ensure the previously identified vegetation had not enlarged and that there was no evidence of valvular incompetence or developing abscess on 11/15/2019, which showed an echodensity on the bioprosthetic aortic valve that appeared less mobile but larger measuring 1.1 x 0.6 cm.  There was then echolucent rim with mild Doppler flow in the 2 o'clock position in the short axis images with perivalvular abscess unable to be excluded.  Initially, plans were to coordinate the patient's care with [facility] however the family requested he be transferred to [facility] for continuity of care.  He was admitted to [facility] while awaiting bed placement at [facility].   Past Medical History:  Diagnosis Date  . Coronary artery disease   . Diabetes (Axis)   . H/O aortic valve replacement   . History of kidney stones   . Hx of CABG   . Hyperlipidemia   . Hypertension   . Sleep apnea     Past Surgical History:  Procedure Laterality Date  .  AORTIC VALVE REPLACEMENT  2017   [facility], bioprosthetic  . APPENDECTOMY    . CARDIAC VALVE REPLACEMENT    . CORONARY ARTERY BYPASS GRAFT  2017   [facility] - LIMA-LAD and SVG-OM  . CYSTOSCOPY W/ RETROGRADES Right 05/27/2019   Procedure: CYSTOSCOPY WITH RETROGRADE PYELOGRAM;  Surgeon: Billey Co, MD;  Location: [facility] ORS;  Service: Urology;  Laterality: Right;  . CYSTOSCOPY W/  RETROGRADES Right 07/11/2019   Procedure: CYSTOSCOPY WITH RETROGRADE PYELOGRAM;  Surgeon: Billey Co, MD;  Location: [facility] ORS;  Service: Urology;  Laterality: Right;  . CYSTOSCOPY/URETEROSCOPY/HOLMIUM LASER/STENT PLACEMENT Right 05/27/2019   Procedure: CYSTOSCOPY/URETEROSCOPY/HOLMIUM LASER/STENT PLACEMENT;  Surgeon: Billey Co, MD;  Location: [facility] ORS;  Service: Urology;  Laterality: Right;  . CYSTOSCOPY/URETEROSCOPY/HOLMIUM LASER/STENT PLACEMENT Right 06/17/2019   Procedure: CYSTOSCOPY/URETEROSCOPY/HOLMIUM LASER/STENT Exchange;  Surgeon: Billey Co, MD;  Location: [facility] ORS;  Service: Urology;  Laterality: Right;  . CYSTOSCOPY/URETEROSCOPY/HOLMIUM LASER/STENT PLACEMENT Right 07/11/2019   Procedure: CYSTOSCOPY/URETEROSCOPY/HOLMIUM LASER/STENT Exchange;  Surgeon: Billey Co, MD;  Location: [facility] ORS;  Service: Urology;  Laterality: Right;  . STONE EXTRACTION WITH BASKET Right 07/11/2019   Procedure: STONE EXTRACTION WITH BASKET;  Surgeon: Billey Co, MD;  Location: [facility] ORS;  Service: Urology;  Laterality: Right;  . TEE WITHOUT CARDIOVERSION N/A 04/21/2019   Procedure: TRANSESOPHAGEAL ECHOCARDIOGRAM (TEE);  Surgeon: Minna Merritts, MD;  Location: [facility] ORS;  Service: Cardiovascular;  Laterality: N/A;  . TEE WITHOUT CARDIOVERSION N/A 07/04/2019   Procedure: TRANSESOPHAGEAL ECHOCARDIOGRAM (TEE);  Surgeon: Wellington Hampshire, MD;  Location: [facility] ORS;  Service: Cardiovascular;  Laterality: N/A;  . TEE WITHOUT CARDIOVERSION N/A 08/17/2019   Procedure: TRANSESOPHAGEAL ECHOCARDIOGRAM (TEE);  Surgeon: Kate Sable, MD;  Location: [facility] ORS;  Service: Cardiovascular;  Laterality: N/A;  . TONSILLECTOMY       Medications Prior to Admission: Prior to Admission medications   Medication Sig Start Date End Date Taking? Authorizing Provider  amLODipine (NORVASC) 5 MG tablet Take 5 mg by mouth daily. 03/08/19   [provider]  amoxicillin (AMOXIL) 500 MG capsule Take 1  capsule (500 mg total) by mouth in the morning and at bedtime. 11/08/19   Tsosie Billing, MD  Ascorbic Acid (VITAMIN C WITH ROSE HIPS) 500 MG tablet Take 1,000 mg by mouth daily.     [provider]  aspirin EC 81 MG tablet Take 81 mg by mouth daily.  05/30/14   [provider]  atorvastatin (LIPITOR) 80 MG tablet Take 80 mg by mouth every evening.  08/13/18   [provider]  AZO-CRANBERRY PO Take 1 tablet by mouth daily.     [provider]  glipiZIDE (GLUCOTROL XL) 2.5 MG 24 hr tablet Take 2.5 mg by mouth daily with breakfast.  05/05/19   [provider]  insulin aspart (NOVOLOG) 100 UNIT/ML injection Inject 0-5 Units into the skin at bedtime. 04/23/19   Epifanio Lesches, MD  isosorbide mononitrate (IMDUR) 60 MG 24 hr tablet Take 1 tablet (60 mg total) by mouth daily. 05/12/19 11/02/19  End, Harrell Gave, MD  lisinopril (ZESTRIL) 20 MG tablet Take 1 tablet (20 mg total) by mouth daily. Please call to schedule appointment for further refills. Thank you! 08/09/19 05/05/20  End, Harrell Gave, MD  loratadine (CLARITIN) 10 MG tablet Take 10 mg by mouth daily. 09/13/18 11/02/19  [provider]  metoprolol succinate (TOPROL-XL) 25 MG 24 hr tablet Take 1.5 tablets (37.5 mg total) by mouth daily. Patient taking differently: Take 25 mg by mouth every evening.  08/26/19 05/22/20  Loel Dubonnet, NP  nitroGLYCERIN (NITROSTAT) 0.4 MG SL tablet Place 0.4 mg under the tongue every 5 (five) minutes x 3 doses as needed for chest pain.  03/24/18 11/02/19  [provider]  NOVOLIN 70/30 RELION (70-30) 100 UNIT/ML injection Inject 100 Units into the skin 2 (two) times daily.  05/13/19   [provider]  omeprazole (PRILOSEC) 20 MG capsule Take 20 mg by mouth every evening.  04/22/18 11/02/19  [provider]  ondansetron (ZOFRAN-ODT) 4 MG disintegrating tablet Take 4 mg by mouth every 8 (eight) hours as needed for nausea or vomiting.     [provider]  oxyCODONE-acetaminophen (PERCOCET) 5-325 MG tablet Take 1 tablet by mouth every 8 (eight) hours as needed. Patient taking differently: Take 1 tablet by mouth every 8 (eight) hours as needed (pain.).  10/19/19   Earleen Newport, MD  Potassium Citrate 15 MEQ (1620 MG) TBCR Take 2 tablets by mouth 2 (two) times daily. Patient taking differently: Take 1 tablet by mouth 2 (two) times daily.  07/20/19   Billey Co, MD  terbinafine (LAMISIL AT) 1 % cream Apply 1 application topically 2 (two) times daily. 11/08/19   Tsosie Billing, MD  terbinafine (LAMISIL) 1 % cream Apply 1 application topically 2 (two) times daily as needed (ATHLETE'S FOOT).    [provider]     Allergies:    Allergies  Allergen Reactions  . Metformin Diarrhea    Loose stools even with XR    Social History:   Social History   Socioeconomic History  . Marital status: Married    Spouse name: Not on file  . Number of children: Not on file  . Years of education: Not on file  . Highest education level: Not on file  Occupational History  . Not on file  Tobacco Use  . Smoking status: Former Research scientist (life sciences)  . Smokeless tobacco: Never Used  Substance and Sexual Activity  . Alcohol use: Never  . Drug use: Never  . Sexual activity: Not on file  Other Topics Concern  . Not on file  Social History Narrative  . Not on file   Social Determinants of Health   Financial Resource Strain:   . Difficulty of Paying Living Expenses:   Food Insecurity:   . Worried About Charity fundraiser in the Last Year:   . Arboriculturist in the Last Year:   Transportation Needs:   . Film/video editor (Medical):   Marland Kitchen Lack of Transportation (Non-Medical):   Physical Activity:   . Days of Exercise per Week:   . Minutes of Exercise per Session:   Stress:   . Feeling of Stress :   Social Connections:   . Frequency of Communication with Friends and Family:   . Frequency of Social Gatherings  with Friends and Family:   . Attends Religious Services:   . Active Member of Clubs or Organizations:   . Attends Archivist Meetings:   Marland Kitchen Marital Status:   Intimate Partner Violence:   . Fear of Current or Ex-Partner:   . Emotionally Abused:   Marland Kitchen Physically Abused:   . Sexually Abused:     Family History:   The patient's family history includes Heart attack (age of onset: 23) in his father.    ROS:  Please see the history of present illness.  All other ROS reviewed and negative.     Physical Exam/Data:   Vitals:  11/15/19 1215 11/15/19 1245 11/15/19 1300 11/15/19 1315  BP: (!) 116/57  (!) 162/79 (!) 146/83  Pulse: 78 82 91 77  Resp: 15 17 (!) 29 19  Temp:      TempSrc:      SpO2: 96% 96% 96% 95%  Weight:      Height:       No intake or output data in the 24 hours ending 11/15/19 1447 Last 3 Weights 11/15/2019 11/08/2019 11/02/2019  Weight (lbs) 250 lb 246 lb 249 lb 6 oz  Weight (kg) 113.399 kg 111.585 kg 113.116 kg     General:  Well nourished, well developed, in no acute distress HEENT: Normal Lymph: No adenopathy Neck: No JVD Endocrine:  No thryomegaly Vascular: No carotid bruits; FA pulses 2+ bilaterally without bruits  Cardiac:  Normal S1, S2; RRR; I/VI systolic murmur  Lungs:  Clear to auscultation bilaterally, no wheezing, rhonchi or rales  Abd: Soft, nontender, no hepatomegaly  Ext: No edema Musculoskeletal:  No deformities, BUE and BLE strength normal and equal Skin: Warm and dry  Neuro:  CNs 2-12 intact, no focal abnormalities noted Psych:  Normal affect    EKG:  The ECG that was done 11/02/2019 was personally reviewed and demonstrates NSR with septal Q waves and lateral T-wave inversions. No significant change since prior tracing on 08/26/19  Relevant CV Studies:  TEE 11/15/2019: Left Ventrical:Normal  Mitral Valve:Normal. Trivial MR.  Aortic Valve:Bioprosthetic valve present. Echodensity appears less mobile but  larger, measuring 1.1 x 0.6 cm. There is an echolucent rim with mild Doppler flow in the 2 o'clock position in the short axis images; perivalvular abscess cannot be excluded.  Tricuspid Valve:Not well-visualized.  Pulmonic Valve:Not well-visualized.  Left Atrium/ Left atrial appendage:Grossly normal.  Atrial septum:No shunt by color Doppler.  Aorta:Mild plaquing.  Complications:No apparent complications Patientdidtolerate procedure well. __________  [facility] 05/2019:  The study is normal.  This is a low risk study.  The left ventricular ejection fraction is hyperdynamic (>65%). EF 71%  No evidence for ischemia __________  [facility] 09/2017: Findings: 1. Significant 3-vessel coronary artery disease. There is 80% stenosis of  the ostial LAD and 70% stenosis of OM1. There is 50% stenosis of the  proximal RPDA and 90% stenosis of a small caliber RPL Datha Kissinger.  2. Patent LIMA to LAD and SVG to OM 3. Left heart cath deferred due to history of AVR.   Recommendations:  Medical Management  Aggressive secondary prevention  ASA 81 mg daily indefinitely   Laboratory Data:  High Sensitivity Troponin:  No results for input(s): TROPONINIHS in the last 720 hours.    ChemistryNo results for input(s): NA, K, CL, CO2, GLUCOSE, BUN, CREATININE, CALCIUM, GFRNONAA, GFRAA, ANIONGAP in the last 168 hours.  No results for input(s): PROT, ALBUMIN, AST, ALT, ALKPHOS, BILITOT in the last 168 hours. HematologyNo results for input(s): WBC, RBC, HGB, HCT, MCV, MCH, MCHC, RDW, PLT in the last 168 hours. BNPNo results for input(s): BNP, PROBNP in the last 168 hours.  DDimer No results for input(s): DDIMER in the last 168 hours.   Radiology/Studies:  No results found. {  Assessment and Plan:   1. 1. Infective endocarditis: Patient has had 3 hospital admissions for Enterococcus bacteremia with TEE performed earlier today showing a less mobile though enlarging bioprosthetic  aortic valve vegetation with the inability to exclude perivalvular abscess.  Recommend he continue suppressive amoxicillin as previously directed by ID.  Consider coronary CTA to better evaluate for potential perivalvular abscess  as well as to evaluate his coronary disease and grafts given his history of atypical migratory chest pain as well as ID and cardiothoracic surgery consults.  Trend sed rate and CRP.   2. CAD with stable angina status post CABG: He has a long history of atypical chest pain that dates back to his CABG and AVR in 01/2016.  Cardiac cath in 2019 showed patent grafts with underlying severe native vessel CAD.  Lexiscan MPI in 05/2019 showed no evidence of ischemia.  Continue secondary prevention with aspirin, Imdur, atorvastatin, metoprolol, and lisinopril.  3. HTN: Continue current medical therapy including amlodipine, Imdur, lisinopril, and metoprolol.  4. HLD: Continue atorvastatin.  5. Insulin-dependent diabetes: SSI.  Glipizide.  6. GERD: Protonix.  7. Biliary colic: Previously recommended for robotic cholecystectomy by general surgery.  Await above work-up with further recommendations pending.  Severity of Illness: The appropriate patient status for this patient is INPATIENT. Inpatient status is judged to be reasonable and necessary in order to provide the required intensity of service to ensure the patient's safety. The patient's presenting symptoms, physical exam findings, and initial radiographic and laboratory data in the context of their chronic comorbidities is felt to place them at high risk for further clinical deterioration. Furthermore, it is not anticipated that the patient will be medically stable for discharge from the hospital within 2 midnights of admission. The following factors support the patient status of inpatient.   " The patient's presenting symptoms include as above. " The worrisome physical exam findings include as above. " The initial  radiographic and laboratory data are worrisome because of as above. " The chronic co-morbidities include as above.   * I certify that at the point of admission it is my clinical judgment that the patient will require inpatient hospital care spanning beyond 2 midnights from the point of admission due to high intensity of service, high risk for further deterioration and high frequency of surveillance required.*    For questions or updates, please contact [place] Please consult [email or web address] for contact info under        Signed, Christell Faith, PA-C  11/15/2019 3:10 PM   Attending note Patient transferred from [facility], H&P staffed earlier today by Dr End at [facility], please see his note from earlier today. Mr. Mole is a 74 y/o man with history of CAD s/p CABG in 2017 (LIMA-LAD and SVG-OM), AS s/p bioprosthetic AVR in 2017, HTN, HLD, DM2, OSA not on CPAP, GERD, and tobacco use, admitted following TEE with concern for enlarging bioprosthetic AVR vegetation and possible perivalvular abscess. Transferred for further management. Will likelyneed cardiac CTA as suggested by Dr End, ID consultation and potentially CT surgery consultation pending CT findings. Continue current abx regimen for now. Given the need to evaluate bypass grafts and possible perivavluar abscess and patients body habitus/BMI would discuss with CT attending in AM to verify appropriate protocol and parameters for patient, I have not ordered imaging overnight.      Carlyle Dolly MD

## 2019-11-16 ENCOUNTER — Inpatient Hospital Stay (HOSPITAL_COMMUNITY): Payer: Medicare HMO

## 2019-11-16 ENCOUNTER — Encounter (HOSPITAL_COMMUNITY): Payer: Self-pay | Admitting: Cardiovascular Disease

## 2019-11-16 DIAGNOSIS — Z794 Long term (current) use of insulin: Secondary | ICD-10-CM

## 2019-11-16 DIAGNOSIS — Z953 Presence of xenogenic heart valve: Secondary | ICD-10-CM | POA: Diagnosis not present

## 2019-11-16 DIAGNOSIS — I330 Acute and subacute infective endocarditis: Secondary | ICD-10-CM | POA: Diagnosis not present

## 2019-11-16 DIAGNOSIS — I25118 Atherosclerotic heart disease of native coronary artery with other forms of angina pectoris: Secondary | ICD-10-CM | POA: Diagnosis not present

## 2019-11-16 DIAGNOSIS — E119 Type 2 diabetes mellitus without complications: Secondary | ICD-10-CM | POA: Diagnosis not present

## 2019-11-16 DIAGNOSIS — I2510 Atherosclerotic heart disease of native coronary artery without angina pectoris: Secondary | ICD-10-CM

## 2019-11-16 LAB — BASIC METABOLIC PANEL
Anion gap: 9 (ref 5–15)
BUN: 13 mg/dL (ref 8–23)
CO2: 23 mmol/L (ref 22–32)
Calcium: 9.1 mg/dL (ref 8.9–10.3)
Chloride: 104 mmol/L (ref 98–111)
Creatinine, Ser: 0.85 mg/dL (ref 0.61–1.24)
GFR calc Af Amer: >60 mL/min (ref >60)
GFR calc non Af Amer: >60 mL/min (ref >60)
Glucose, Bld: 253 mg/dL — ABNORMAL HIGH (ref 70–99)
Potassium: 4.2 mmol/L (ref 3.5–5.1)
Sodium: 136 mmol/L (ref 135–145)

## 2019-11-16 LAB — CBC
HCT: 36.9 % — ABNORMAL LOW (ref 39.0–52.0)
Hemoglobin: 12.1 g/dL — ABNORMAL LOW (ref 13.0–17.0)
MCH: 34.8 pg — ABNORMAL HIGH (ref 26.0–34.0)
MCHC: 32.8 g/dL (ref 30.0–36.0)
MCV: 106 fL — ABNORMAL HIGH (ref 80.0–100.0)
Platelets: 143 10*3/uL — ABNORMAL LOW (ref 150–400)
RBC: 3.48 MIL/uL — ABNORMAL LOW (ref 4.22–5.81)
RDW: 14.5 % (ref 11.5–15.5)
WBC: 5.1 10*3/uL (ref 4.0–10.5)
nRBC: 0 % (ref 0.0–0.2)

## 2019-11-16 LAB — BRAIN NATRIURETIC PEPTIDE: B Natriuretic Peptide: 42.8 pg/mL (ref 0.0–100.0)

## 2019-11-16 LAB — GLUCOSE, CAPILLARY
Glucose-Capillary: 223 mg/dL — ABNORMAL HIGH (ref 70–99)
Glucose-Capillary: 154 mg/dL — ABNORMAL HIGH (ref 70–99)
Glucose-Capillary: 113 mg/dL — ABNORMAL HIGH (ref 70–99)
Glucose-Capillary: 143 mg/dL — ABNORMAL HIGH (ref 70–99)

## 2019-11-16 MED ORDER — METOPROLOL TARTRATE 50 MG PO TABS
50.0000 mg | ORAL_TABLET | Freq: Once | ORAL | Status: AC
  Administered 2019-11-16: 10:00:00 50 mg via ORAL
  Filled 2019-11-16: qty 1

## 2019-11-16 MED ORDER — SODIUM CHLORIDE 0.9 % IV SOLN: INTRAVENOUS | Status: DC

## 2019-11-16 MED ORDER — METOPROLOL TARTRATE 50 MG PO TABS: 50.0000 mg | ORAL_TABLET | Freq: Two times a day (BID) | ORAL | Status: DC

## 2019-11-16 MED ORDER — SODIUM CHLORIDE 0.9% FLUSH: 10.0000 mL | INTRAVENOUS | Status: DC | PRN

## 2019-11-16 MED ORDER — SODIUM CHLORIDE 0.9% FLUSH
10.0000 mL | Freq: Two times a day (BID) | INTRAVENOUS | Status: DC
  Administered 2019-11-16 – 2019-11-18 (×3): 10 mL

## 2019-11-16 MED ORDER — NITROGLYCERIN 0.4 MG SL SUBL
SUBLINGUAL_TABLET | SUBLINGUAL | Status: AC
  Administered 2019-11-16: 0.8 mg via SUBLINGUAL
  Filled 2019-11-16: qty 2

## 2019-11-16 MED ORDER — IOHEXOL 350 MG/ML SOLN
100.0000 mL | Freq: Once | INTRAVENOUS | Status: AC | PRN
  Administered 2019-11-16: 100 mL via INTRAVENOUS

## 2019-11-16 MED ORDER — METOPROLOL TARTRATE 50 MG PO TABS
50.0000 mg | ORAL_TABLET | Freq: Once | ORAL | Status: AC
  Administered 2019-11-16: 50 mg via ORAL
  Filled 2019-11-16: qty 1

## 2019-11-16 NOTE — Plan of Care (Signed)

## 2019-11-16 NOTE — Progress Notes (Signed)
Progress Note  Patient Name: Jonathan Cross. Date of Encounter: 11/16/2019  Primary Cardiologist: Nelva Bush, MD   Subjective   Wants to know about the testing, had some mild R CP today, has occasionally, resolves spontaneously  Inpatient Medications    Scheduled Meds: . amLODipine  5 mg Oral Daily  . amoxicillin  500 mg Oral Q12H  . vitamin C  1,000 mg Oral Daily  . aspirin EC  81 mg Oral Daily  . atorvastatin  80 mg Oral Daily  . glipiZIDE  2.5 mg Oral QAC breakfast  . heparin  5,000 Units Subcutaneous Q8H  . insulin aspart  0-5 Units Subcutaneous QHS  . insulin aspart protamine- aspart  100 Units Subcutaneous BID WC  . isosorbide mononitrate  60 mg Oral Daily  . lisinopril  20 mg Oral Daily  . metoprolol succinate  25 mg Oral QPM  . pantoprazole  40 mg Oral Daily   Continuous Infusions:  PRN Meds: acetaminophen, nitroGLYCERIN, ondansetron (ZOFRAN) IV, oxyCODONE-acetaminophen   Vital Signs    Vitals:   11/15/19 2054 11/16/19 0020 11/16/19 0452 11/16/19 0750  BP: (!) 143/63 (!) 158/70 133/71 (!) 156/61  Pulse: 81 88 77   Resp: 18 18 18 16   Temp: 98.4 F (36.9 C) 98.7 F (37.1 C) 98.3 F (36.8 C) 98.1 F (36.7 C)  TempSrc: Oral Oral Oral Oral  SpO2: 97% 98% 97%   Weight:      Height:        Intake/Output Summary (Last 24 hours) at 11/16/2019 0840 Last data filed at 11/16/2019 0717 Gross per 24 hour  Intake --  Output 250 ml  Net -250 ml   Last 3 Weights 11/15/2019 11/15/2019 11/15/2019  Weight (lbs) 247 lb 11.2 oz 247 lb 3.2 oz 250 lb  Weight (kg) 112.356 kg 112.129 kg 113.399 kg      Telemetry    SR - Personally Reviewed  ECG    None today - Personally Reviewed  Physical Exam   GEN: No acute distress.   Neck: No JVD Cardiac: RRR, 2/6 murmurs, rubs, or gallops.  Respiratory: decreased BS bases but Clear to auscultation bilaterally. GI: Soft, nontender, non-distended  MS: No edema; No deformity. Neuro:  Nonfocal  Psych: Normal affect    Labs    High Sensitivity Troponin:   Recent Labs  Lab 11/15/19 1559  TROPONINIHS 6      Chemistry Recent Labs  Lab 11/15/19 1559 11/15/19 2043 11/16/19 0502  NA  --   --  136  K  --   --  4.2  CL  --   --  104  CO2  --   --  23  GLUCOSE  --   --  253*  BUN  --   --  13  CREATININE 0.79 0.76 0.85  CALCIUM  --   --  9.1  GFRNONAA >60 >60 >60  GFRAA >60 >60 >60  ANIONGAP  --   --  9     Hematology Recent Labs  Lab 11/15/19 1559 11/15/19 2043 11/16/19 0502  WBC 4.8 7.6 5.1  RBC 3.52* 3.69* 3.48*  HGB 12.4* 13.0 12.1*  HCT 36.3* 39.1 36.9*  MCV 103.1* 106.0* 106.0*  MCH 35.2* 35.2* 34.8*  MCHC 34.2 33.2 32.8  RDW 14.7 14.7 14.5  PLT 126* 141* 143*    BNP Recent Labs  Lab 11/15/19 1559 11/16/19 0502  BNP 37.0 42.8      Lab Results  Component Value Date  HGBA1C 8.0 (H) 06/29/2019     Radiology    ECHO TEE  Result Date: 11/15/2019    TRANSESOPHOGEAL ECHO REPORT   Patient Name:   Raine Alwardt [facility]. Date of Exam: 11/15/2019 Medical Rec #:  [id number]              Height:       69.0 in Accession #:    [id number]             Weight:       250.0 lb Date of Birth:  09/12/1945              BSA:          2.272 m Patient Age:    74 years               BP:           137/70 mmHg Patient Gender: M                      HR:           89 bpm. Exam Location:  [facility] Procedure: Transesophageal Echo, Color Doppler and Cardiac Doppler Indications:     Endocarditis  History:         Patient has prior history of Echocardiogram examinations, most                  recent 08/17/2019. Prior CABG; Risk Factors:Hypertension and                  Diabetes. H/o AVR.  Sonographer:     Sherrie Sport RDCS (AE) Referring Phys:  [id number] CHRISTOPHER END Diagnosing Phys: Harrell Gave End MD PROCEDURE: After discussion of the risks and benefits of a TEE, an informed consent was obtained from the patient. TEE procedure time was 20 minutes. The transesophogeal probe was passed without difficulty through the  esophogus of the patient. Imaged were obtained with the patient in a left lateral decubitus position. Local oropharyngeal anesthetic was provided with viscous lidocaine. Sedation performed by performing physician. Patients was under conscious sedation during this procedure. Anesthetic administered: 109mcg of Fentanyl, 3mg  of Versed. Image quality was good. The patient's vital signs; including heart rate, blood pressure, and oxygen saturation; remained stable throughout the procedure. The patient developed no complications during the procedure. IMPRESSIONS  1. Left ventricular ejection fraction, by estimation, is 55 to 60%. The left ventricle has normal function. The left ventricle has no regional wall motion abnormalities.  2. Right ventricular systolic function is normal. The right ventricular size is normal.  3. No left atrial/left atrial appendage thrombus was detected.  4. The mitral valve is normal in structure. Trivial mitral valve regurgitation.  5. Echodensity on the aortic valve prosthesis is slightly larger compared to 08/2019 and appears more organized (measures 1.1 x 0.7 cm). There is echolucency adjacent to the annulus of the AVR along the left coronary cusp. Developing perivalvular abscess  cannot be excluded.. The aortic valve has been repaired/replaced. Aortic valve regurgitation is trivial. Echo findings are consistent with vegetation of the aortic prosthesis.  6. There is mild (Grade II) plaque involving the descending aorta. FINDINGS  Left Ventricle: Left ventricular ejection fraction, by estimation, is 55 to 60%. The left ventricle has normal function. The left ventricle has no regional wall motion abnormalities. Right Ventricle: The right ventricular size is normal. Right ventricular systolic function is normal. Left Atrium: No left atrial/left atrial appendage thrombus was detected. Pericardium:  The pericardium was not well visualized. Mitral Valve: The mitral valve is normal in structure.  Trivial mitral valve regurgitation. Tricuspid Valve: The tricuspid valve is not well visualized. Aortic Valve: Echodensity on the aortic valve prosthesis is slightly larger compared to 08/2019 and appears more organized (measures 1.1 x 0.7 cm). There is echolucency adjacent to the annulus of the AVR along the left coronary cusp. Developing perivalvular abscess cannot be excluded. The aortic valve has been repaired/replaced. Aortic valve regurgitation is trivial. There is a 23 mm bioprosthetic valve present in the aortic position. Pulmonic Valve: The pulmonic valve was not well visualized. Aorta: The aortic root was not well visualized. There is mild (Grade II) plaque involving the descending aorta. IAS/Shunts: No atrial level shunt detected by color flow Doppler. Nelva Bush MD Electronically signed by Nelva Bush MD Signature Date/Time: 11/15/2019/5:33:08 PM    Final     Cardiac Studies   TEE 11/15/2019: Left Ventrical:Normal  Mitral Valve:Normal. Trivial MR.  Aortic Valve:Bioprosthetic valve present. Echodensity appears less mobile but larger, measuring 1.1 x 0.6 cm. There is an echolucent rim with mild Doppler flow in the 2 o'clock position in the short axis images; perivalvular abscess cannot be excluded.  Tricuspid Valve:Not well-visualized.  Pulmonic Valve:Not well-visualized.  Left Atrium/ Left atrial appendage:Grossly normal.  Atrial septum:No shunt by color Doppler.  Aorta:Mild plaquing.  Complications:No apparent complications Patientdidtolerate procedure well.  Patient Profile     74 y.o. 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  Tx to [facility] for TCTS consult.    Assessment & Plan    1. Bacterial endocarditis:  - admitted 04/2019 &06/2019 for enterococcus bacteremia, TEE ok both times - admitted 07/2019 w/ enterococcal bactermia, TEE 08/17/2019 w/ 10 x 5 mm vegetation, for 6 wks ABX - (ER visit [id number] for biliary colic, needs robot choley) - 05/04 TEE w/ vegetation enlarging and less mobile - TCTS consult, Trish calling, prev surgery done at [facility] 2017 - Cardiac CT to eval for ?abscess>>ordered - currently on amoxicillin only, will get Pharm consult to make sure that is all that is needed  2. CAD, s/p CABG 2017 w/ LIMA-LAD, SVG-OM - no ischemic sx - MV 05/2019 ok, no cath since CABG - continue ASA, BB, high-dose statin, ACE, Imdur 60 mg qd  3. HTN - amlodipine 5 mg qd and Toprol XL 25 mg qd are pta meds - follow HR, may be able to increase BB (getting extra dose today for CT) - SBP generally > 130, can increase amlodipine if cannot go up on BB  4. DM - A1c was 8.0 06/2019, recheck - on home dose insulin, SSI at bedtime  5. HLD, goal LDL < 70 - continue high-dose statin  Principal Problem:   Bacterial endocarditis Active Problems:   Bacteremia due to Enterococcus   Coronary artery disease of native artery of native heart with stable angina pectoris (HCC)   Essential hypertension   History of aortic valve replacement with bioprosthetic valve   Type 2 diabetes mellitus without complication, with long-term current use of insulin ([place])   Hyperlipidemia LDL goal <70    For questions or  updates, please contact [place] Please consult [email or web address] for contact info under        Signed, Rosaria Ferries, PA-C  11/16/2019, 8:40 AM

## 2019-11-17 DIAGNOSIS — I25118 Atherosclerotic heart disease of native coronary artery with other forms of angina pectoris: Secondary | ICD-10-CM | POA: Diagnosis not present

## 2019-11-17 DIAGNOSIS — I330 Acute and subacute infective endocarditis: Secondary | ICD-10-CM | POA: Diagnosis not present

## 2019-11-17 DIAGNOSIS — I339 Acute and subacute endocarditis, unspecified: Secondary | ICD-10-CM

## 2019-11-17 DIAGNOSIS — I350 Nonrheumatic aortic (valve) stenosis: Secondary | ICD-10-CM

## 2019-11-17 DIAGNOSIS — Z953 Presence of xenogenic heart valve: Secondary | ICD-10-CM | POA: Diagnosis not present

## 2019-11-17 LAB — GLUCOSE, CAPILLARY
Glucose-Capillary: 202 mg/dL — ABNORMAL HIGH (ref 70–99)
Glucose-Capillary: 139 mg/dL — ABNORMAL HIGH (ref 70–99)
Glucose-Capillary: 190 mg/dL — ABNORMAL HIGH (ref 70–99)
Glucose-Capillary: 203 mg/dL — ABNORMAL HIGH (ref 70–99)

## 2019-11-17 LAB — HEMOGLOBIN A1C
Hgb A1c MFr Bld: 9.5 % — ABNORMAL HIGH (ref 4.8–5.6)
Mean Plasma Glucose: 225.95 mg/dL

## 2019-11-17 MED ORDER — SODIUM CHLORIDE 0.9 % IV SOLN
2.0000 g | Freq: Two times a day (BID) | INTRAVENOUS | Status: DC
  Administered 2019-11-17 – 2019-11-18 (×2): 2 g via INTRAVENOUS
  Filled 2019-11-17 (×2): qty 2
  Filled 2019-11-17 (×2): qty 20

## 2019-11-17 MED ORDER — SODIUM CHLORIDE 0.9 % IV SOLN
2.0000 g | INTRAVENOUS | Status: DC
  Administered 2019-11-17 – 2019-11-18 (×6): 2 g via INTRAVENOUS
  Filled 2019-11-17: qty 2
  Filled 2019-11-17 (×2): qty 2000
  Filled 2019-11-17: qty 2
  Filled 2019-11-17: qty 2000
  Filled 2019-11-17: qty 2
  Filled 2019-11-17 (×2): qty 2000
  Filled 2019-11-17 (×3): qty 2

## 2019-11-17 MED ORDER — FLUCONAZOLE 200 MG PO TABS
200.0000 mg | ORAL_TABLET | ORAL | Status: DC
  Administered 2019-11-17: 18:00:00 200 mg via ORAL
  Filled 2019-11-17: qty 1

## 2019-11-17 MED ORDER — CHLORHEXIDINE GLUCONATE CLOTH 2 % EX PADS
6.0000 | MEDICATED_PAD | Freq: Every day | CUTANEOUS | Status: DC
  Administered 2019-11-17 – 2019-11-18 (×2): 6 via TOPICAL

## 2019-11-17 MED ORDER — METOPROLOL SUCCINATE ER 25 MG PO TB24
25.0000 mg | ORAL_TABLET | Freq: Two times a day (BID) | ORAL | Status: DC
  Administered 2019-11-17 – 2019-11-18 (×3): 25 mg via ORAL
  Filled 2019-11-17 (×3): qty 1

## 2019-11-17 NOTE — Consult Note (Signed)
[address]       [place] [address]             [place]. [place] Record [id number] Date of Birth: 06/04/1946  Referring: No ref. provider found Primary Care: Georges Mouse, MD Primary Cardiologist:Christopher End, MD  Chief Complaint:   No chief complaint on file. h/o treatment for PVE  History of Present Illness:     74 yo man underwent CABG/AVR at [facility] in 2017.  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  He had Enterococcus isolated from the blood in January 2021.  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  He is now readmitted with an enlarging mass but has generally been asymptomatic with no malaise or fevers.  He has no conduction abnormalities.  Current Activity/ Functional Status: Patient will be independent with mobility/ambulation, transfers, ADL's, IADL's.   Zubrod Score: At the time of surgery this patient's most appropriate activity status/level should be described as: []     0    Normal activity, no symptoms [x]     1    Restricted in physical strenuous activity but ambulatory, able to do out light work []     2    Ambulatory and capable of self care, unable to do work activities, up and about                 more than 50%  Of the time                            []     3    Only limited self care, in bed greater than 50% of waking hours []     4    Completely disabled, no self care, confined to bed or chair []     5    Moribund  Past Medical History:  Diagnosis Date  . 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 Coronary artery disease   . Diabetes ([place])   . H/O aortic valve replacement   . History of kidney stones   . Hx of CABG   . Hyperlipidemia   . Hypertension   . Sleep apnea     Past Surgical History:  Procedure Laterality Date  . AORTIC VALVE REPLACEMENT  2017   [facility], bioprosthetic  .  APPENDECTOMY    . CARDIAC VALVE REPLACEMENT    . CORONARY ARTERY BYPASS GRAFT  2017   [facility] - LIMA-LAD and SVG-OM  . CYSTOSCOPY W/ RETROGRADES Right 05/27/2019   Procedure: CYSTOSCOPY WITH RETROGRADE PYELOGRAM;  Surgeon: Billey Co, MD;  Location: [facility] ORS;  Service: Urology;  Laterality: Right;  . CYSTOSCOPY W/ RETROGRADES Right 07/11/2019   Procedure: CYSTOSCOPY WITH RETROGRADE PYELOGRAM;  Surgeon: Billey Co, MD;  Location: [facility];  Service: Urology;  Laterality: Right;  . CYSTOSCOPY/URETEROSCOPY/HOLMIUM LASER/STENT PLACEMENT Right 05/27/2019   Procedure: CYSTOSCOPY/URETEROSCOPY/HOLMIUM LASER/STENT PLACEMENT;  Surgeon: Billey Co, MD;  Location: [facility];  Service: Urology;  Laterality: Right;  . CYSTOSCOPY/URETEROSCOPY/HOLMIUM LASER/STENT PLACEMENT Right 06/17/2019   Procedure: CYSTOSCOPY/URETEROSCOPY/HOLMIUM LASER/STENT Exchange;  Surgeon: Billey Co, MD;  Location: [facility];  Service: Urology;  Laterality: Right;  . CYSTOSCOPY/URETEROSCOPY/HOLMIUM LASER/STENT PLACEMENT Right 07/11/2019   Procedure: CYSTOSCOPY/URETEROSCOPY/HOLMIUM LASER/STENT Exchange;  Surgeon: Billey Co, MD;  Location: [facility];  Service:  Urology;  Laterality: Right;  . STONE EXTRACTION WITH BASKET Right 07/11/2019   Procedure: STONE EXTRACTION WITH BASKET;  Surgeon: Billey Co, MD;  Location: [facility];  Service: Urology;  Laterality: Right;  . TEE WITHOUT CARDIOVERSION N/A 04/21/2019   Procedure: TRANSESOPHAGEAL ECHOCARDIOGRAM (TEE);  Surgeon: Minna Merritts, MD;  Location: [facility];  Service: Cardiovascular;  Laterality: N/A;  . TEE WITHOUT CARDIOVERSION N/A 07/04/2019   Procedure: TRANSESOPHAGEAL ECHOCARDIOGRAM (TEE);  Surgeon: Wellington Hampshire, MD;  Location: [facility];  Service: Cardiovascular;  Laterality: N/A;  . TEE WITHOUT CARDIOVERSION N/A 08/17/2019   Procedure: TRANSESOPHAGEAL ECHOCARDIOGRAM (TEE);  Surgeon: Kate Sable, MD;  Location: [facility];  Service:  Cardiovascular;  Laterality: N/A;  . TEE WITHOUT CARDIOVERSION N/A 11/15/2019   Procedure: TRANSESOPHAGEAL ECHOCARDIOGRAM (TEE);  Surgeon: Nelva Bush, MD;  Location: [facility];  Service: Cardiovascular;  Laterality: N/A;  . TONSILLECTOMY      Social History   Tobacco Use  Smoking Status Former Smoker  Smokeless Tobacco Never Used    Social History   Substance and Sexual Activity  Alcohol Use Never     Allergies  Allergen Reactions  . Metformin Diarrhea    Loose stools even with XR    Current Facility-Administered Medications  Medication Dose Route Frequency Provider Last Rate Last Admin  . 0.9 %  sodium chloride infusion   Intravenous Continuous Barrett, Rhonda G, PA-C 50 mL/hr at 11/17/19 0700 Rate Verify at 11/17/19 0700  . acetaminophen (TYLENOL) tablet 650 mg  650 mg Oral Q4H PRN Bhagat, Bhavinkumar, PA      . amLODipine (NORVASC) tablet 5 mg  5 mg Oral Daily Bhagat, Bhavinkumar, PA   5 mg at 11/17/19 0811  . amoxicillin (AMOXIL) capsule 500 mg  500 mg Oral Q12H Bhagat, Bhavinkumar, PA   500 mg at 11/17/19 0811  . ascorbic acid (VITAMIN C) tablet 1,000 mg  1,000 mg Oral Daily Bhagat, Bhavinkumar, PA   1,000 mg at 11/17/19 0810  . aspirin EC tablet 81 mg  81 mg Oral Daily Bhagat, Bhavinkumar, PA   81 mg at 11/17/19 0810  . atorvastatin (LIPITOR) tablet 80 mg  80 mg Oral Daily Bhagat, Bhavinkumar, PA   80 mg at 11/17/19 0811  . glipiZIDE (GLUCOTROL) tablet 2.5 mg  2.5 mg Oral QAC breakfast Bhagat, Bhavinkumar, PA   2.5 mg at 11/17/19 0810  . heparin injection 5,000 Units  5,000 Units Subcutaneous Q8H Bhagat, Bhavinkumar, PA   5,000 Units at 11/17/19 0534  . insulin aspart (novoLOG) injection 0-5 Units  0-5 Units Subcutaneous QHS Skeet Latch, MD   3 Units at 11/15/19 2232  . insulin aspart protamine- aspart (NOVOLOG MIX 70/30) injection 100 Units  100 Units Subcutaneous BID WC Bhagat, Bhavinkumar, PA   100 Units at 11/17/19 0811  . isosorbide mononitrate (IMDUR) 24 hr  tablet 60 mg  60 mg Oral Daily Bhagat, Bhavinkumar, PA   60 mg at 11/17/19 0812  . lisinopril (ZESTRIL) tablet 20 mg  20 mg Oral Daily Bhagat, Bhavinkumar, PA   20 mg at 11/17/19 0812  . metoprolol succinate (TOPROL-XL) 24 hr tablet 25 mg  25 mg Oral BID Barrett, Rhonda G, PA-C      . nitroGLYCERIN (NITROSTAT) SL tablet 0.4 mg  0.4 mg Sublingual Q5 Min x 3 PRN Bhagat, Bhavinkumar, PA   0.8 mg at 11/16/19 1407  . ondansetron (ZOFRAN) injection 4 mg  4 mg Intravenous Q6H PRN Bhagat, Bhavinkumar, PA      . oxyCODONE-acetaminophen (PERCOCET/ROXICET)  5-325 MG per tablet 1 tablet  1 tablet Oral Q8H PRN Bhagat, Bhavinkumar, PA      . pantoprazole (PROTONIX) EC tablet 40 mg  40 mg Oral Daily Bhagat, Bhavinkumar, PA   40 mg at 11/17/19 0812  . sodium chloride flush (NS) 0.9 % injection 10-40 mL  10-40 mL Intracatheter Q12H Skeet Latch, MD   10 mL at 11/16/19 2145  . sodium chloride flush (NS) 0.9 % injection 10-40 mL  10-40 mL Intracatheter PRN Skeet Latch, MD        Medications Prior to Admission  Medication Sig Dispense Refill Last Dose  . amLODipine (NORVASC) 5 MG tablet Take 5 mg by mouth daily.   11/14/2019 at Unknown time  . amoxicillin (AMOXIL) 500 MG capsule Take 1 capsule (500 mg total) by mouth in the morning and at bedtime. 60 capsule 5 11/14/2019 at Unknown time  . Ascorbic Acid (VITAMIN C WITH ROSE HIPS) 500 MG tablet Take 1,000 mg by mouth daily.    11/14/2019 at Unknown time  . aspirin EC 81 MG tablet Take 81 mg by mouth daily.    11/14/2019 at Unknown time  . atorvastatin (LIPITOR) 80 MG tablet Take 80 mg by mouth every evening.    11/14/2019 at Unknown time  . AZO-CRANBERRY PO Take 1 tablet by mouth daily.    unknown  . glipiZIDE (GLUCOTROL XL) 2.5 MG 24 hr tablet Take 2.5 mg by mouth daily with breakfast.    11/14/2019 at Unknown time  . isosorbide mononitrate (IMDUR) 60 MG 24 hr tablet Take 1 tablet (60 mg total) by mouth daily. 90 tablet 3 11/14/2019 at Unknown time  . lisinopril  (ZESTRIL) 20 MG tablet Take 1 tablet (20 mg total) by mouth daily. Please call to schedule appointment for further refills. Thank you! 30 tablet 0 11/14/2019 at Unknown time  . loratadine (CLARITIN) 10 MG tablet Take 10 mg by mouth daily.   11/14/2019 at Unknown time  . metoprolol succinate (TOPROL-XL) 25 MG 24 hr tablet Take 1.5 tablets (37.5 mg total) by mouth daily. (Patient taking differently: Take 25 mg by mouth every evening. ) 45 tablet 8 11/14/2019 at 1800  . nitroGLYCERIN (NITROSTAT) 0.4 MG SL tablet Place 0.4 mg under the tongue every 5 (five) minutes x 3 doses as needed for chest pain.    unknown  . NOVOLIN 70/30 RELION (70-30) 100 UNIT/ML injection Inject 100 Units into the skin 2 (two) times daily.    11/14/2019 at Unknown time  . omeprazole (PRILOSEC) 20 MG capsule Take 20 mg by mouth every evening.    11/14/2019 at Unknown time  . ondansetron (ZOFRAN-ODT) 4 MG disintegrating tablet Take 4 mg by mouth every 8 (eight) hours as needed for nausea or vomiting.   unknown  . oxyCODONE-acetaminophen (PERCOCET) 5-325 MG tablet Take 1 tablet by mouth every 8 (eight) hours as needed. (Patient taking differently: Take 1 tablet by mouth every 8 (eight) hours as needed (pain.). ) 20 tablet 0 Past Month at Unknown time  . Potassium Citrate 15 MEQ (1620 MG) TBCR Take 2 tablets by mouth 2 (two) times daily. (Patient taking differently: Take 1 tablet by mouth 2 (two) times daily. ) 120 tablet 6 11/14/2019 at Unknown time  . terbinafine (LAMISIL AT) 1 % cream Apply 1 application topically 2 (two) times daily. 30 g 3 unknown  . terbinafine (LAMISIL) 1 % cream Apply 1 application topically 2 (two) times daily as needed (ATHLETE'S FOOT).   unknown  . insulin  aspart (NOVOLOG) 100 UNIT/ML injection Inject 0-5 Units into the skin at bedtime. (Patient not taking: Reported on 11/15/2019) 10 mL 11 Not Taking at Unknown time    Family History  Problem Relation Age of Onset  . Heart attack Father 97     Review of Systems:    ROS A comprehensive review of systems was negative.     Cardiac Review of Systems: Y or  [    ]= no  Chest Pain [    ]  Resting SOB [   ] Exertional SOB  [  ]  Orthopnea [  ]   Pedal Edema [   ]    Palpitations [  ] Syncope  [  ]   Presyncope [   ]  General Review of Systems: [Y] = yes [  ]=no Constitional: recent weight change [  ]; anorexia [  ]; fatigue [  ]; nausea [  ]; night sweats [  ]; fever [  ]; or chills [  ]                                                               Dental: Last Dentist visit:   Eye : blurred vision [  ]; diplopia [   ]; vision changes [  ];  Amaurosis fugax[  ]; Resp: cough [  ];  wheezing[  ];  hemoptysis[  ]; shortness of breath[  ]; paroxysmal nocturnal dyspnea[  ]; dyspnea on exertion[  ]; or orthopnea[  ];  GI:  gallstones[  ], vomiting[  ];  dysphagia[  ]; melena[  ];  hematochezia [  ]; heartburn[  ];   Hx of  Colonoscopy[  ]; GU: kidney stones [  ]; hematuria[  ];   dysuria [  ];  nocturia[  ];  history of     obstruction [  ]; urinary frequency [  ]             Skin: rash, swelling[  ];, hair loss[  ];  peripheral edema[  ];  or itching[  ]; Musculosketetal: myalgias[  ];  joint swelling[  ];  joint erythema[  ];  joint pain[  ];  back pain[  ];  Heme/Lymph: bruising[  ];  bleeding[  ];  anemia[  ];  Neuro: TIA[  ];  headaches[  ];  stroke[  ];  vertigo[  ];  seizures[  ];   paresthesias[  ];  difficulty walking[  ];  Psych:depression[  ]; anxiety[  ];  Endocrine: diabetes[  ];  thyroid dysfunction[  ];        Physical Exam: BP (!) 142/68 (BP Location: Right Arm)   Pulse 90   Temp 98.4 F (36.9 C) (Oral)   Resp 18   Ht 5\' 9"  (1.753 m)   Wt 111 kg   SpO2 94%   BMI 36.14 kg/m    General appearance: alert and cooperative Neck: no adenopathy, no carotid bruit, no JVD, supple, symmetrical, trachea midline and thyroid not enlarged, symmetric, no tenderness/mass/nodules Resp: clear to auscultation bilaterally Cardio: regular rate and  rhythm, S1, S2 normal, no murmur, click, rub or gallop Extremities: extremities normal, atraumatic, no cyanosis or edema Neurologic: Alert and oriented X 3, normal strength and tone. Normal  symmetric reflexes. Normal coordination and gait  Diagnostic Studies & Laboratory data:     Recent Radiology Findings:   CT CORONARY MORPH W/CTA COR W/SCORE W/CA W/CM &/OR WO/CM  Addendum Date: 11/16/2019   ADDENDUM REPORT: 11/16/2019 19:37 CLINICAL DATA:  Prosthetic Valve Endocarditis. EXAM: Cardiac CT TECHNIQUE: The patient was scanned on a [employer]. A 120 kV retrospective scan was triggered in the descending thoracic aorta at 111 HU's. Gantry rotation speed was 250 msecs and collimation was .6 mm. No beta blockade was given. 0.8 mg of nitroglycerin was given. The 3D data set was reconstructed in 5% intervals of the R-R cycle. Systolic and diastolic phases were analyzed on a dedicated work station using MPR, MIP and VRT modes. The patient received 80 cc of contrast. FINDINGS: Image quality: Excellent. Noise artifact is: Limited. Aortic Valve: A 23 mm prosthetic valve is present in the aortic position. The valve appears to be a magna ease pericardial valve. There is a small (5 mm x 3 mm) mass-like structure attached to the [place] leaflet. The mass is not freely mobile and moves with the leaflet. The HU are >145 suggestive of a fibrous tissue, likely representing healed vegetation in this patient with prior endocarditis. There is no restriction of leaflet motion. There is a small extravasation (5.7 mm x 4.6 mm) of contrast behind the non-coronary cusp of the aortic root near the sewing ring of the prosthetic valve directed toward the IAS. This likely represents a pseudoaneurysm/abscess in this patient with prior endocarditis. There are no fistulous connections observed. Coronary Arteries: Coronary calcium score not performed due to history of prior bypass. Normal coronary origin. Right dominance. Bypass Grafts:  A patent LIMA to distal LAD is seen. A patent SVG to OM1 is present. Left main: The left main is a large caliber vessel with a normal take off from the left coronary cusp that trifurcates into a LAD, LCX, and ramus intermedius. There is minimal calcified plaque (<25%). Left anterior descending artery: The proximal LAD is severely calcified with a severe stenosis (70-99%). The mid LAD is diffusely diseased with mild calcified plaque (25-49%). A patent LIMA graft is seen to the distal LAD. Ramus intermedius: Moderate calcified plaque (50-69%). Left circumflex artery: The LCX is non-dominant. The proximal LCX contains mild calcified plaque (25-49%). OM1 contains a moderate stenosis (50-69%). A patent SVG is present to the OM1 branch. Right coronary artery: The RCA is dominant with normal take off from the right coronary cusp. The proximal RCA contains minimal calcified plaque (<25%). The mid RCA contains mild calcified plaque (<25%). The distal RCA contains mild calcified plaque (25-49%). The PDA is diffusely diseased with moderate (50-69%) mixed density plaque. The RPLV branch contains a severe stenosis (70-99%) with plaque of mixed density. Cardiac Morphology: Right Atrium: Right atrial size is within normal limits. Right Ventricle: The right ventricular cavity is within normal limits. Left Atrium: Left atrial size is normal in size with no left atrial appendage filling defect. Left Ventricle: The ventricular cavity size is within normal limits. There are no stigmata of prior infarction. The left ventricle is hyperdynamic with LVEF = 83%. Chordal SAM is present. Postoperative septal movement is noted. No regional wall motion abnormalities. Pulmonary arteries: Normal in size without proximal filling defect. Pulmonary veins: Normal pulmonary venous drainage. Pericardium: Normal thickness with no significant effusion or calcium present. Mitral Valve: The mitral valve is normal structure without significant  calcification. Extra-cardiac findings: See attached radiology report for non-cardiac structures. IMPRESSION: 1. 23  mm prosthetic aortic valve with evidence of a small mass on the leaflet of the [place] which represents a likely healed vegetation. The mass is not mobile and the leaflets are freely moveable without evidence of destruction. 2. There is evidence of a small pseudoaneurysm (5.7 mm x 4.6 mm) of the [place] consistent with aortic root abscess as described above. 3. Severe native 3-vessel CAD. 4. Patent LIMA-LAD and SVG-OM1. [place] T. Audie Box, MD Electronically Signed   By: Eleonore Chiquito   On: 11/16/2019 19:37   Result Date: 11/16/2019 EXAM: OVER-READ INTERPRETATION  CT CHEST The following report is an over-read performed by radiologist Dr. Vinnie Langton of [facility], [place] on 11/16/2019. This over-read does not include interpretation of cardiac or coronary anatomy or pathology. The coronary calcium score/coronary CTA interpretation by the cardiologist is attached. COMPARISON:  None. FINDINGS: Aortic atherosclerosis. Within the visualized portions of the thorax there are no suspicious appearing pulmonary nodules or masses, there is no acute consolidative airspace disease, no pleural effusions, no pneumothorax and no lymphadenopathy. Visualized portions of the upper abdomen are unremarkable. There are no aggressive appearing lytic or blastic lesions noted in the visualized portions of the skeleton. Status post median sternotomy for CABG and aortic valve replacement with a stented bioprosthesis. IMPRESSION: 1.  Aortic Atherosclerosis (ICD10-I70.0). Electronically Signed: By: Vinnie Langton M.D. On: 11/16/2019 14:40   ECHO TEE  Result Date: 11/15/2019    TRANSESOPHOGEAL ECHO REPORT   Patient Name:   Jonathan Cross [facility] Date of Exam: 11/15/2019 Medical Rec #:  [id number]              Height:       69.0 in Accession #:    [id number]             Weight:       250.0 lb Date of Birth:  [birth year]               BSA:          2.272 m Patient Age:    35 years               BP:           137/70 mmHg Patient Gender: M                      HR:           89 bpm. Exam Location:  [facility] Procedure: Transesophageal Echo, Color Doppler and Cardiac Doppler Indications:     Endocarditis  History:         Patient has prior history of Echocardiogram examinations, most                  recent 08/17/2019. Prior CABG; Risk Factors:Hypertension and                  Diabetes. H/o AVR.  Sonographer:     Sherrie Sport RDCS (AE) Referring Phys:  [id number] CHRISTOPHER END Diagnosing Phys: Harrell Gave End MD PROCEDURE: After discussion of the risks and benefits of a TEE, an informed consent was obtained from the patient. TEE procedure time was 20 minutes. The transesophogeal probe was passed without difficulty through the esophogus of the patient. Imaged were obtained with the patient in a left lateral decubitus position. Local oropharyngeal anesthetic was provided with viscous lidocaine. Sedation performed by performing physician. Patients was under conscious sedation during this procedure. Anesthetic administered: 40mcg of Fentanyl, 3mg  of Versed. Image quality was  good. The patient's vital signs; including heart rate, blood pressure, and oxygen saturation; remained stable throughout the procedure. The patient developed no complications during the procedure. IMPRESSIONS  1. Left ventricular ejection fraction, by estimation, is 55 to 60%. The left ventricle has normal function. The left ventricle has no regional wall motion abnormalities.  2. Right ventricular systolic function is normal. The right ventricular size is normal.  3. No left atrial/left atrial appendage thrombus was detected.  4. The mitral valve is normal in structure. Trivial mitral valve regurgitation.  5. Echodensity on the aortic valve prosthesis is slightly larger compared to 08/2019 and appears more organized (measures 1.1 x 0.7 cm). There is echolucency adjacent to the annulus of the AVR  along the left coronary cusp. Developing perivalvular abscess  cannot be excluded.. The aortic valve has been repaired/replaced. Aortic valve regurgitation is trivial. Echo findings are consistent with vegetation of the aortic prosthesis.  6. There is mild (Grade II) plaque involving the descending aorta. FINDINGS  Left Ventricle: Left ventricular ejection fraction, by estimation, is 55 to 60%. The left ventricle has normal function. The left ventricle has no regional wall motion abnormalities. Right Ventricle: The right ventricular size is normal. Right ventricular systolic function is normal. Left Atrium: No left atrial/left atrial appendage thrombus was detected. Pericardium: The pericardium was not well visualized. Mitral Valve: The mitral valve is normal in structure. Trivial mitral valve regurgitation. Tricuspid Valve: The tricuspid valve is not well visualized. Aortic Valve: Echodensity on the aortic valve prosthesis is slightly larger compared to 08/2019 and appears more organized (measures 1.1 x 0.7 cm). There is echolucency adjacent to the annulus of the AVR along the left coronary cusp. Developing perivalvular abscess cannot be excluded. The aortic valve has been repaired/replaced. Aortic valve regurgitation is trivial. There is a 23 mm bioprosthetic valve present in the aortic position. Pulmonic Valve: The pulmonic valve was not well visualized. Aorta: The aortic root was not well visualized. There is mild (Grade II) plaque involving the descending aorta. IAS/Shunts: No atrial level shunt detected by color flow Doppler. Nelva Bush MD Electronically signed by Nelva Bush MD Signature Date/Time: 11/15/2019/5:33:08 PM    Final      I have independently reviewed the above radiologic studies and discussed with the patient   Recent Lab Findings: Lab Results  Component Value Date   WBC 5.1 11/16/2019   HGB 12.1 (L) 11/16/2019   HCT 36.9 (L) 11/16/2019   PLT 143 (L) 11/16/2019   GLUCOSE 253 (H)  11/16/2019   ALT 44 11/08/2019   AST 34 11/08/2019   NA 136 11/16/2019   K 4.2 11/16/2019   CL 104 11/16/2019   CREATININE 0.85 11/16/2019   BUN 13 11/16/2019   CO2 23 11/16/2019   INR 1.0 08/13/2019   HGBA1C 9.5 (H) 11/16/2019      Assessment / Plan:      74 year old gentleman status post aortic valve replacement and coronary artery bypass grafting at [facility].  He has been followed for several months with suspicion of prosthetic valve endocarditis and follow-up echo suggests increased size of mobile mass.  However he has no symptoms of endocarditis at present.  I feel like it safe to follow him further.  I am happy to see him in the office in a couple of weeks to further evaluate, pending blood cultures drawn yesterday.     I  spent 20 minutes counseling the patient face to face.   Khaden Gater Z. Orvan Seen, [place] 11/17/2019 9:16  AM

## 2019-11-17 NOTE — Consult Note (Signed)
[facility]    Date of Admission:  11/15/2019     Total days of antibiotics                Reason for Consult: Enterococcal Endocarditis   Referring Provider: Oval Linsey Primary Care Provider: Georges Mouse, MD   ASSESSMENT:  Jonathan Cross is a 74 y/o male with recent history of prosthetic valve enterococcus endocarditis s/p 6 weeks with IV ampicillin and ceftriaxone and now suppressive amoxicillin presenting with TEE showing increased size of mobile density and concern for developing perivalvular abscess. Asymptomatic at present and blood cultures are without growth. CVTS recommends no surgical intervention at this time. Concern with enlarging density and potential perivalvular abscess and will recommend re-treatment of 6 weeks of antibiotics with ampicillin and ceftriaxone. Rash located in multiple areas including stomach, groin and feet that appear consistent with tinea corporis. Treat with fluconazole.   PLAN:  1. Start ceftriaxone and ampicillin for endocarditis. 2. Fluconazole 200 mg daily for tinea corporis/cruis. 3. Will need PICC line - await blood culture results.    Principal Problem:   Bacterial endocarditis Active Problems:   Bacteremia due to Enterococcus   Coronary artery disease of native artery of native heart with stable angina pectoris (HCC)   Essential hypertension   History of aortic valve replacement with bioprosthetic valve   Type 2 diabetes mellitus without complication, with long-term current use of insulin (HCC)   Hyperlipidemia LDL goal <70   . amLODipine  5 mg Oral Daily  . amoxicillin  500 mg Oral Q12H  . vitamin C  1,000 mg Oral Daily  . aspirin EC  81 mg Oral Daily  . atorvastatin  80 mg Oral Daily  . glipiZIDE  2.5 mg Oral QAC breakfast  . heparin  5,000 Units Subcutaneous Q8H  . insulin aspart  0-5 Units Subcutaneous QHS  . insulin aspart protamine- aspart  100 Units Subcutaneous BID WC  . isosorbide  mononitrate  60 mg Oral Daily  . lisinopril  20 mg Oral Daily  . metoprolol succinate  25 mg Oral BID  . pantoprazole  40 mg Oral Daily  . sodium chloride flush  10-40 mL Intracatheter Q12H     HPI: Jonathan Shelhamer. is a 74 y.o. male with previous medical history of diabetes, renal calculi, coronary artery disease s/p CABG and aortic valve replacement, enterococcus bacteremia complicated by prosthetic valve endocarditis admitted to with larger, less mobile density on follow up TEE.   Jonathan Cross was initially seen by Dr. Delaine Lame at [facility] in October 2020 with enterococcal bacteremia with TEE showing no evidence of endocarditis and believed to be renal source. Treated with 10 days of IV ampicillin which was finished on 04/29/2019. He was readmitted to the hospital on 06/26/19 with fever and found to have recurrent enterococcal bacteremia. TTE was normal and no vegetations noted on TEE. Treated once again with 10 days of ampicillin with concern for urinary source of infection. On 08/11/19 developed 2 day history of fever with blood cultures on 08/13/19 once again positive for enterococcus and was started on ampicillin and gentamycin with concern for prosthetic valve infective endocarditis. Treatment plan for was for 6 weeks with ampicillin and ceftriaxone with end date of 09/27/19. Last seen by Dr. Delaine Lame. Placed on amoxicillin following completion of IV therapy. Was seen by cardiology and TEE as noted showed increased size of mobile density and now with concern for developing perivalvular abscess.  Jonathan Cross has been afebrile since admission with no leukocytes. Blood cultures are pending with cultures from 4/27 are without growth. CVTS evaluated with no recommended surgical intervention at present. Has a rash located on his abdomen that he been going on for a couple of years and rash located on the top of his feet.    Review of Systems: Review of Systems  Constitutional: Negative for  chills, fever and weight loss.  Respiratory: Negative for cough, shortness of breath and wheezing.   Cardiovascular: Negative for chest pain and leg swelling.  Gastrointestinal: Negative for abdominal pain, constipation, diarrhea, nausea and vomiting.  Skin: Positive for rash.     Past Medical History:  Diagnosis Date  . 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 Coronary artery disease   . Diabetes ([place])   . H/O aortic valve replacement   . History of kidney stones   . Hx of CABG   . Hyperlipidemia   . Hypertension   . Sleep apnea     Social History   Tobacco Use  . Smoking status: Former Research scientist (life sciences)  . Smokeless tobacco: Never Used  Substance Use Topics  . Alcohol use: Never  . Drug use: Never    Family History  Problem Relation Age of Onset  . Heart attack Father 45    Allergies  Allergen Reactions  . Metformin Diarrhea    Loose stools even with XR    OBJECTIVE: Blood pressure (!) 142/68, pulse 90, temperature 98.4 F (36.9 C), temperature source Oral, resp. rate 18, height 5\' 9"  (1.753 m), weight 111 kg, SpO2 94 %.  Physical Exam Constitutional:      General: He is not in acute distress.    Appearance: He is well-developed.     Comments: Lying in bed with head of bed elevated; pleasant.   Cardiovascular:     Rate and Rhythm: Normal rate and regular rhythm.     Heart sounds: Normal heart sounds.  Pulmonary:     Effort: Pulmonary effort is normal.     Breath sounds: Normal breath sounds.  Skin:    General: Skin is warm and dry.     Findings: Rash present.     Comments: Rash appearing with darker borders and lighter centers consistent with fungal infection.   Neurological:     Mental Status: He is alert and oriented to person, place, and time.  Psychiatric:        Mood and Affect: Mood normal.     Lab Results Lab Results  Component Value Date   WBC 5.1 11/16/2019   HGB 12.1 (L) 11/16/2019   HCT 36.9 (L) 11/16/2019   MCV 106.0 (H) 11/16/2019   PLT 143 (L)  11/16/2019    Lab Results  Component Value Date   CREATININE 0.85 11/16/2019   BUN 13 11/16/2019   NA 136 11/16/2019   K 4.2 11/16/2019   CL 104 11/16/2019   CO2 23 11/16/2019    Lab Results  Component Value Date   ALT 44 11/08/2019   AST 34 11/08/2019   ALKPHOS 53 11/08/2019   BILITOT 0.8 11/08/2019     Microbiology: Recent Results (from the past 240 hour(s))  Blood culture (routine single)     Status: None   Collection Time: 11/08/19 10:04 AM   Specimen: BLOOD  Result Value Ref Range Status   Specimen Description BLOOD BLOOD RIGHT HAND  Final   Special Requests   Final    BOTTLES DRAWN AEROBIC AND ANAEROBIC Blood Culture adequate volume  Culture   Final    NO GROWTH 5 DAYS Performed at [facility], [address] [place], [place] [address]    Report Status 11/13/2019 FINAL  Final  Blood culture (routine single)     Status: None   Collection Time: 11/08/19 10:04 AM   Specimen: BLOOD  Result Value Ref Range Status   Specimen Description BLOOD BLOOD LEFT HAND  Final   Special Requests   Final    BOTTLES DRAWN AEROBIC AND ANAEROBIC Blood Culture adequate volume   Culture   Final    NO GROWTH 5 DAYS Performed at [facility], [address], [place], [place] [address]    Report Status 11/13/2019 FINAL  Final  SARS CORONAVIRUS 2 (TAT 6-24 HRS) Nasopharyngeal Nasopharyngeal Swab     Status: None   Collection Time: 11/11/19  1:57 PM   Specimen: Nasopharyngeal Swab  Result Value Ref Range Status   SARS Coronavirus 2 NEGATIVE NEGATIVE Final    Comment: (NOTE) SARS-CoV-2 target nucleic acids are NOT DETECTED. The SARS-CoV-2 RNA is generally detectable in upper and lower respiratory specimens during the acute phase of infection. Negative results do not preclude SARS-CoV-2 infection, do not rule out co-infections with other pathogens, and should not be used as the sole basis for treatment or other patient management decisions. Negative results must  be combined with clinical observations, patient history, and epidemiological information. The expected result is Negative. Fact Sheet for Patients: SugarRoll.be Fact Sheet for Healthcare Providers: https://www.woods-mathews.com/ This test is not yet approved or cleared by the [place] FDA and  has been authorized for detection and/or diagnosis of SARS-CoV-2 by FDA under an Emergency Use Authorization (EUA). This EUA will remain  in effect (meaning this test can be used) for the duration of the COVID-19 declaration under Section 56 4(b)(1) of the Act, 21 U.S.C. section 360bbb-3(b)(1), unless the authorization is terminated or revoked sooner. Performed at [facility], [place] [address] [place], [place] [address]   Culture, blood (Routine X 2) w Reflex to ID Panel     Status: None (Preliminary result)   Collection Time: 11/16/19 10:15 AM   Specimen: BLOOD LEFT HAND  Result Value Ref Range Status   Specimen Description BLOOD LEFT HAND  Final   Special Requests   Final    BOTTLES DRAWN AEROBIC ONLY Blood Culture adequate volume   Culture   Final    NO GROWTH < 24 HOURS Performed at [facility], [place] [address] [place], [place] [address]    Report Status PENDING  Incomplete  Culture, blood (Routine X 2) w Reflex to ID Panel     Status: None (Preliminary result)   Collection Time: 11/16/19 10:31 AM   Specimen: BLOOD RIGHT HAND  Result Value Ref Range Status   Specimen Description BLOOD RIGHT HAND  Final   Special Requests   Final    BOTTLES DRAWN AEROBIC AND ANAEROBIC Blood Culture adequate volume   Culture   Final    NO GROWTH < 24 HOURS Performed at [facility], [place] [address], [place], [place] [address]    Report Status PENDING  Incomplete     Terri Piedra, NP [facility]  11/17/2019  8:48 AM

## 2019-11-17 NOTE — TOC Initial Note (Signed)
Transition of Care (TOC) - Initial/Assessment Note    Patient Details  Name: Jonathan Cross. MRN: [id number] Date of Birth: [birth year]  Transition of Care [facility]) CM/SW Contact:    Bethena Roys, RN Phone Number: 11/17/2019, 2:25 PM  Clinical Narrative: Risk for readmission assessment competed. Prior to arrival patient was independent from home with spouse and daughter. Patient has a primary care provider and he uses [facility] for medications. Patient has support of spouse in the home. No home health needs identified at this time. Case Manager will continue to follow for additional transition of care needs.              Expected Discharge Plan: Home/Self Care Barriers to Discharge: Continued Medical Work up   Patient Goals and CMS Choice Patient states their goals for this hospitalization and ongoing recovery are:: to return home   Choice offered to / list presented to : NA  Expected Discharge Plan and Services Expected Discharge Plan: Home/Self Care In-house Referral: NA Discharge Planning Services: NA Post Acute Care Choice: NA Living arrangements for the past 2 months: Single Family Home                 DME Arranged: N/A    HH Arranged: NA     Prior Living Arrangements/Services Living arrangements for the past 2 months: Single Family Home Lives with:: Spouse Patient language and need for interpreter reviewed:: Yes Do you feel safe going back to the place where you live?: Yes      Need for Family Participation in Patient Care: Yes (Comment) Care giver support system in place?: Yes (comment)   Criminal Activity/Legal Involvement Pertinent to Current Situation/Hospitalization: No - Comment as needed  Activities of Daily Living Home Assistive Devices/Equipment: None ADL Screening (condition at time of admission) Patient's cognitive ability adequate to safely complete daily activities?: Yes Is the patient deaf or have difficulty  hearing?: Yes Does the patient have difficulty seeing, even when wearing glasses/contacts?: No Does the patient have difficulty concentrating, remembering, or making decisions?: No Patient able to express need for assistance with ADLs?: Yes Does the patient have difficulty dressing or bathing?: No Independently performs ADLs?: Yes (appropriate for developmental age) Does the patient have difficulty walking or climbing stairs?: Yes Weakness of Legs: Both Weakness of Arms/Hands: None  Permission Sought/Granted Permission sought to share information with : Family Supports    Emotional Assessment Appearance:: Appears stated age Attitude/Demeanor/Rapport: Engaged Affect (typically observed): Appropriate Orientation: : Oriented to Self, Oriented to Place, Oriented to  Time, Oriented to Situation Alcohol / Substance Use: Not Applicable Psych Involvement: No (comment)  Admission diagnosis:  Endocarditis [I38] Bacterial endocarditis [I33.0] Patient Active Problem List   Diagnosis Date Noted  . Swelling of toe of left foot 11/02/2019  . Bacterial endocarditis   . Coronary artery disease   . Hypoalbuminemia 06/29/2019  . Hyperglycemia 06/29/2019  . Lactic acidosis 06/29/2019  . GERD (gastroesophageal reflux disease) 06/29/2019  . 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 Nonrheumatic aortic valve stenosis 05/13/2019  . Hyperlipidemia LDL goal <70 05/13/2019  . Preop cardiovascular exam 05/13/2019  . Bacteremia due to Enterococcus 04/18/2019  . Class 2 severe obesity due to excess calories with serious comorbidity and body mass index (BMI) of 38.0 to 38.9 in adult ([place]) 03/24/2018  . History of aortic valve replacement with bioprosthetic valve 08/07/2016  . Status post coronary artery bypass graft 08/07/2016  . Coronary artery disease of native artery of native heart  with stable angina pectoris ([place]) 02/11/2016  . SVT (supraventricular tachycardia) ([place]) 07/31/2015  . Type 2 diabetes  mellitus without complication, with long-term current use of insulin ([place]) 03/28/2015  . Benign prostatic hyperplasia 04/07/2014  . OSA (obstructive sleep apnea) 04/07/2014  . Essential hypertension 05/16/2009   PCP:  Georges Mouse, MD Pharmacy:   [facility] [address]), [place] [address] [place] ([place]) [place] [address] Phone: [phone] Fax: [place], [place] - [address] [place] AT [facility] [address] [place] [address] Phone: [phone] Fax: [phone]   Readmission Risk Interventions Readmission Risk Prevention Plan 11/17/2019 08/16/2019 06/30/2019  Transportation Screening Complete Complete Complete  PCP or Specialist Appt within 3-5 Days - Complete -  HRI or Home Care Consult Complete - Patient refused  Social Work Consult for [place] Planning/Counseling Complete - Complete  Palliative Care Screening Not Applicable Not Applicable Not Applicable  Medication Review (RN Care Manager) Complete Complete Complete  Some recent data might be hidden

## 2019-11-17 NOTE — Progress Notes (Signed)
Progress Note  Patient Name: Jonathan Cross. Date of Encounter: 11/17/2019  Primary Cardiologist: Nelva Bush, MD   Subjective   Feels pretty well today. Wants to get things going. Has some hyperpigmented areas of skin, groin and lighter on legs, some have been there over a year, some in the last 3 months.   Inpatient Medications    Scheduled Meds: . amLODipine  5 mg Oral Daily  . amoxicillin  500 mg Oral Q12H  . vitamin C  1,000 mg Oral Daily  . aspirin EC  81 mg Oral Daily  . atorvastatin  80 mg Oral Daily  . glipiZIDE  2.5 mg Oral QAC breakfast  . heparin  5,000 Units Subcutaneous Q8H  . insulin aspart  0-5 Units Subcutaneous QHS  . insulin aspart protamine- aspart  100 Units Subcutaneous BID WC  . isosorbide mononitrate  60 mg Oral Daily  . lisinopril  20 mg Oral Daily  . metoprolol succinate  25 mg Oral QPM  . pantoprazole  40 mg Oral Daily  . sodium chloride flush  10-40 mL Intracatheter Q12H   Continuous Infusions: . sodium chloride 50 mL/hr at 11/17/19 0700   PRN Meds: acetaminophen, nitroGLYCERIN, ondansetron (ZOFRAN) IV, oxyCODONE-acetaminophen, sodium chloride flush   Vital Signs    Vitals:   11/16/19 1219 11/16/19 1710 11/16/19 2055 11/17/19 0645  BP: (!) 116/59 (!) 107/56 123/66 (!) 142/68  Pulse: 72 71 81 90  Resp:  16 16 18   Temp:  (!) 97.5 F (36.4 C) 98.2 F (36.8 C) 98.4 F (36.9 C)  TempSrc:  Oral Oral Oral  SpO2:  98% 96% 94%  Weight:    111 kg  Height:        Intake/Output Summary (Last 24 hours) at 11/17/2019 0754 Last data filed at 11/17/2019 0700 Gross per 24 hour  Intake 982.57 ml  Output 980 ml  Net 2.57 ml   Last 3 Weights 11/17/2019 11/15/2019 11/15/2019  Weight (lbs) 244 lb 11.2 oz 247 lb 11.2 oz 247 lb 3.2 oz  Weight (kg) 110.995 kg 112.356 kg 112.129 kg      Telemetry    SR - Personally Reviewed  ECG    None today - Personally Reviewed  Physical Exam   GEN: No acute distress.   Neck: No JVD Cardiac: RRR,  soft murmurs, rubs, or gallops.  Respiratory: clear bilaterally GI: Soft, nontender, non-distended  MS: No edema; No deformity. Neuro:  Nonfocal  Skin: lg area of darkened skin w/ some flaking near the edges in his groins, R foot and calf have some hyperpigmented areas as well. Psych: Normal affect   Labs    High Sensitivity Troponin:   Recent Labs  Lab 11/15/19 1559  TROPONINIHS 6      Chemistry Recent Labs  Lab 11/15/19 1559 11/15/19 2043 11/16/19 0502  NA  --   --  136  K  --   --  4.2  CL  --   --  104  CO2  --   --  23  GLUCOSE  --   --  253*  BUN  --   --  13  CREATININE 0.79 0.76 0.85  CALCIUM  --   --  9.1  GFRNONAA >60 >60 >60  GFRAA >60 >60 >60  ANIONGAP  --   --  9     Hematology Recent Labs  Lab 11/15/19 1559 11/15/19 2043 11/16/19 0502  WBC 4.8 7.6 5.1  RBC 3.52* 3.69* 3.48*  HGB 12.4* 13.0 12.1*  HCT 36.3* 39.1 36.9*  MCV 103.1* 106.0* 106.0*  MCH 35.2* 35.2* 34.8*  MCHC 34.2 33.2 32.8  RDW 14.7 14.7 14.5  PLT 126* 141* 143*    BNP Recent Labs  Lab 11/15/19 1559 11/16/19 0502  BNP 37.0 42.8      Lab Results  Component Value Date   HGBA1C 8.0 (H) 06/29/2019     Radiology    CT CORONARY MORPH W/CTA COR W/SCORE W/CA W/CM &/OR WO/CM  Addendum Date: 11/16/2019   ADDENDUM REPORT: 11/16/2019 19:37 CLINICAL DATA:  Prosthetic Valve Endocarditis. EXAM: Cardiac CT TECHNIQUE: The patient was scanned on a [employer]. A 120 kV retrospective scan was triggered in the descending thoracic aorta at 111 HU's. Gantry rotation speed was 250 msecs and collimation was .6 mm. No beta blockade was given. 0.8 mg of nitroglycerin was given. The 3D data set was reconstructed in 5% intervals of the R-R cycle. Systolic and diastolic phases were analyzed on a dedicated work station using MPR, MIP and VRT modes. The patient received 80 cc of contrast. FINDINGS: Image quality: Excellent. Noise artifact is: Limited. Aortic Valve: A 23 mm prosthetic valve  is present in the aortic position. The valve appears to be a magna ease pericardial valve. There is a small (5 mm x 3 mm) mass-like structure attached to the [place] leaflet. The mass is not freely mobile and moves with the leaflet. The HU are >145 suggestive of a fibrous tissue, likely representing healed vegetation in this patient with prior endocarditis. There is no restriction of leaflet motion. There is a small extravasation (5.7 mm x 4.6 mm) of contrast behind the non-coronary cusp of the aortic root near the sewing ring of the prosthetic valve directed toward the IAS. This likely represents a pseudoaneurysm/abscess in this patient with prior endocarditis. There are no fistulous connections observed. Coronary Arteries: Coronary calcium score not performed due to history of prior bypass. Normal coronary origin. Right dominance. Bypass Grafts: A patent LIMA to distal LAD is seen. A patent SVG to OM1 is present. Left main: The left main is a large caliber vessel with a normal take off from the left coronary cusp that trifurcates into a LAD, LCX, and ramus intermedius. There is minimal calcified plaque (<25%). Left anterior descending artery: The proximal LAD is severely calcified with a severe stenosis (70-99%). The mid LAD is diffusely diseased with mild calcified plaque (25-49%). A patent LIMA graft is seen to the distal LAD. Ramus intermedius: Moderate calcified plaque (50-69%). Left circumflex artery: The LCX is non-dominant. The proximal LCX contains mild calcified plaque (25-49%). OM1 contains a moderate stenosis (50-69%). A patent SVG is present to the OM1 branch. Right coronary artery: The RCA is dominant with normal take off from the right coronary cusp. The proximal RCA contains minimal calcified plaque (<25%). The mid RCA contains mild calcified plaque (<25%). The distal RCA contains mild calcified plaque (25-49%). The PDA is diffusely diseased with moderate (50-69%) mixed density plaque. The RPLV branch  contains a severe stenosis (70-99%) with plaque of mixed density. Cardiac Morphology: Right Atrium: Right atrial size is within normal limits. Right Ventricle: The right ventricular cavity is within normal limits. Left Atrium: Left atrial size is normal in size with no left atrial appendage filling defect. Left Ventricle: The ventricular cavity size is within normal limits. There are no stigmata of prior infarction. The left ventricle is hyperdynamic with LVEF = 83%. Chordal SAM is present. Postoperative septal movement is  noted. No regional wall motion abnormalities. Pulmonary arteries: Normal in size without proximal filling defect. Pulmonary veins: Normal pulmonary venous drainage. Pericardium: Normal thickness with no significant effusion or calcium present. Mitral Valve: The mitral valve is normal structure without significant calcification. Extra-cardiac findings: See attached radiology report for non-cardiac structures. IMPRESSION: 1. 23 mm prosthetic aortic valve with evidence of a small mass on the leaflet of the [place] which represents a likely healed vegetation. The mass is not mobile and the leaflets are freely moveable without evidence of destruction. 2. There is evidence of a small pseudoaneurysm (5.7 mm x 4.6 mm) of the [employer] consistent with aortic root abscess as described above. 3. Severe native 3-vessel CAD. 4. Patent LIMA-LAD and SVG-OM1. [place] T. Audie Box, MD Electronically Signed   By: Eleonore Chiquito   On: 11/16/2019 19:37   Result Date: 11/16/2019 EXAM: OVER-READ INTERPRETATION  CT CHEST The following report is an over-read performed by radiologist Dr. Vinnie Langton of [facility], [place] on 11/16/2019. This over-read does not include interpretation of cardiac or coronary anatomy or pathology. The coronary calcium score/coronary CTA interpretation by the cardiologist is attached. COMPARISON:  None. FINDINGS: Aortic atherosclerosis. Within the visualized portions of the thorax there are no  suspicious appearing pulmonary nodules or masses, there is no acute consolidative airspace disease, no pleural effusions, no pneumothorax and no lymphadenopathy. Visualized portions of the upper abdomen are unremarkable. There are no aggressive appearing lytic or blastic lesions noted in the visualized portions of the skeleton. Status post median sternotomy for CABG and aortic valve replacement with a stented bioprosthesis. IMPRESSION: 1.  Aortic Atherosclerosis (ICD10-I70.0). Electronically Signed: By: Vinnie Langton M.D. On: 11/16/2019 14:40   ECHO TEE  Result Date: 11/15/2019    TRANSESOPHOGEAL ECHO REPORT   Patient Name:   Etsel Shed [facility]. Date of Exam: 11/15/2019 Medical Rec #:  [id number]              Height:       69.0 in Accession #:    [id number]             Weight:       250.0 lb Date of Birth:  [birth year]              BSA:          2.272 m Patient Age:    74 years               BP:           137/70 mmHg Patient Gender: M                      HR:           89 bpm. Exam Location:  [facility] Procedure: Transesophageal Echo, Color Doppler and Cardiac Doppler Indications:     Endocarditis  History:         Patient has prior history of Echocardiogram examinations, most                  recent 08/17/2019. Prior CABG; Risk Factors:Hypertension and                  Diabetes. H/o AVR.  Sonographer:     Sherrie Sport RDCS (AE) Referring Phys:  [id number] CHRISTOPHER END Diagnosing Phys: Harrell Gave End MD PROCEDURE: After discussion of the risks and benefits of a TEE, an informed consent was obtained from the patient. TEE procedure time was 20 minutes. The transesophogeal probe  was passed without difficulty through the esophogus of the patient. Imaged were obtained with the patient in a left lateral decubitus position. Local oropharyngeal anesthetic was provided with viscous lidocaine. Sedation performed by performing physician. Patients was under conscious sedation during this procedure. Anesthetic administered: 67mcg of  Fentanyl, 3mg  of Versed. Image quality was good. The patient's vital signs; including heart rate, blood pressure, and oxygen saturation; remained stable throughout the procedure. The patient developed no complications during the procedure. IMPRESSIONS  1. Left ventricular ejection fraction, by estimation, is 55 to 60%. The left ventricle has normal function. The left ventricle has no regional wall motion abnormalities.  2. Right ventricular systolic function is normal. The right ventricular size is normal.  3. No left atrial/left atrial appendage thrombus was detected.  4. The mitral valve is normal in structure. Trivial mitral valve regurgitation.  5. Echodensity on the aortic valve prosthesis is slightly larger compared to 08/2019 and appears more organized (measures 1.1 x 0.7 cm). There is echolucency adjacent to the annulus of the AVR along the left coronary cusp. Developing perivalvular abscess  cannot be excluded.. The aortic valve has been repaired/replaced. Aortic valve regurgitation is trivial. Echo findings are consistent with vegetation of the aortic prosthesis.  6. There is mild (Grade II) plaque involving the descending aorta. FINDINGS  Left Ventricle: Left ventricular ejection fraction, by estimation, is 55 to 60%. The left ventricle has normal function. The left ventricle has no regional wall motion abnormalities. Right Ventricle: The right ventricular size is normal. Right ventricular systolic function is normal. Left Atrium: No left atrial/left atrial appendage thrombus was detected. Pericardium: The pericardium was not well visualized. Mitral Valve: The mitral valve is normal in structure. Trivial mitral valve regurgitation. Tricuspid Valve: The tricuspid valve is not well visualized. Aortic Valve: Echodensity on the aortic valve prosthesis is slightly larger compared to 08/2019 and appears more organized (measures 1.1 x 0.7 cm). There is echolucency adjacent to the annulus of the AVR along the left  coronary cusp. Developing perivalvular abscess cannot be excluded. The aortic valve has been repaired/replaced. Aortic valve regurgitation is trivial. There is a 23 mm bioprosthetic valve present in the aortic position. Pulmonic Valve: The pulmonic valve was not well visualized. Aorta: The aortic root was not well visualized. There is mild (Grade II) plaque involving the descending aorta. IAS/Shunts: No atrial level shunt detected by color flow Doppler. Nelva Bush MD Electronically signed by Nelva Bush MD Signature Date/Time: 11/15/2019/5:33:08 PM    Final     Cardiac Studies   CARDIAC CT: 11/16/2019 IMPRESSION: 1. 23 mm prosthetic aortic valve with evidence of a small mass on the leaflet of the [place] which represents a likely healed vegetation. The mass is not mobile and the leaflets are freely moveable without evidence of destruction.  2. There is evidence of a small pseudoaneurysm (5.7 mm x 4.6 mm) of the [place] consistent with aortic root abscess as described above.  3. Severe native 3-vessel CAD.  4. Patent LIMA-LAD and SVG-OM1.  TEE 11/15/2019: Left Ventrical:Normal  Mitral Valve:Normal. Trivial MR.  Aortic Valve:Bioprosthetic valve present. Echodensity appears less mobile but larger, measuring 1.1 x 0.6 cm. There is an echolucent rim with mild Doppler flow in the 2 o'clock position in the short axis images; perivalvular abscess cannot be excluded.  Tricuspid Valve:Not well-visualized.  Pulmonic Valve:Not well-visualized.  Left Atrium/ Left atrial appendage:Grossly normal.  Atrial septum:No shunt by color Doppler.  Aorta:Mild plaquing.  Complications:No apparent complications Patientdidtolerate procedure well.  Patient  Profile     74 y.o. 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 Tx to [facility] for TCTS consult.    Assessment & Plan    1. Bacterial endocarditis:  - See CT result above - TCTS to see, ID consulted as well. - he has also had biliary colic, needs robot choley, discuss if this could contribute to bacteremia - talked to ID  2. CAD, s/p CABG 2017 w/ LIMA-LAD, SVG-OM - no ischemic sx - MV 05/2019 ok - on ASA, BB, ACE, Imdur, statin  3. HTN - SBP 107-142 last 24 hr - will increase Toprol XL 25 mg to bid as HR 70s-90s and think BP will tolerate - continue amlodipine 5 mg as well  4. DM - continue home dose insulin w/ bedtime SSI - A1c pending  Principal Problem:   Bacterial endocarditis Active Problems:   Bacteremia due to Enterococcus   Coronary artery disease of native artery of native heart with stable angina pectoris (HCC)   Essential hypertension   History of aortic valve replacement with bioprosthetic valve   Type 2 diabetes mellitus without complication, with long-term current use of insulin ([place])   Hyperlipidemia LDL goal <70    For questions or updates, please contact [facility] Please consult [email or web address] for contact info under        Signed, Rosaria Ferries, PA-C  11/17/2019, 7:54 AM

## 2019-11-18 ENCOUNTER — Other Ambulatory Visit: Payer: Self-pay | Admitting: Physician Assistant

## 2019-11-18 DIAGNOSIS — R7881 Bacteremia: Secondary | ICD-10-CM

## 2019-11-18 DIAGNOSIS — B354 Tinea corporis: Secondary | ICD-10-CM

## 2019-11-18 DIAGNOSIS — I358 Other nonrheumatic aortic valve disorders: Secondary | ICD-10-CM

## 2019-11-18 LAB — GLUCOSE, CAPILLARY
Glucose-Capillary: 140 mg/dL — ABNORMAL HIGH (ref 70–99)
Glucose-Capillary: 161 mg/dL — ABNORMAL HIGH (ref 70–99)

## 2019-11-18 MED ORDER — METOPROLOL SUCCINATE ER 25 MG PO TB24: 25.0000 mg | ORAL_TABLET | Freq: Two times a day (BID) | ORAL | 5 refills | Status: DC

## 2019-11-18 NOTE — Plan of Care (Signed)
Pt d/c to home with self care, pt verbalizes understanding of scheduled appointments and the need to schedule appointments for outpatient follow up, verbal and printed d/c information given to pt and his son with verbal understanding returned, left facility via private vehicle accompanied by son.

## 2019-11-18 NOTE — Progress Notes (Signed)
Progress Note  Patient Name: Jonathan Cross. Date of Encounter: 11/18/2019  Primary Cardiologist: Nelva Bush, MD  Subjective   Denies any CP this morning, however previously did have some intermittent substernal chest pain. No SOB.   Inpatient Medications    Scheduled Meds: . amLODipine  5 mg Oral Daily  . vitamin C  1,000 mg Oral Daily  . aspirin EC  81 mg Oral Daily  . atorvastatin  80 mg Oral Daily  . Chlorhexidine Gluconate Cloth  6 each Topical Daily  . fluconazole  200 mg Oral Weekly  . glipiZIDE  2.5 mg Oral QAC breakfast  . heparin  5,000 Units Subcutaneous Q8H  . insulin aspart  0-5 Units Subcutaneous QHS  . insulin aspart protamine- aspart  100 Units Subcutaneous BID WC  . isosorbide mononitrate  60 mg Oral Daily  . lisinopril  20 mg Oral Daily  . metoprolol succinate  25 mg Oral BID  . pantoprazole  40 mg Oral Daily  . sodium chloride flush  10-40 mL Intracatheter Q12H   Continuous Infusions: . sodium chloride 50 mL/hr at 11/17/19 0700  . ampicillin (OMNIPEN) IV 2 g (11/18/19 0449)  . cefTRIAXone (ROCEPHIN)  IV 2 g (11/18/19 0523)   PRN Meds: acetaminophen, nitroGLYCERIN, ondansetron (ZOFRAN) IV, oxyCODONE-acetaminophen, sodium chloride flush   Vital Signs    Vitals:   11/17/19 1401 11/17/19 2002 11/17/19 2235 11/18/19 0536  BP: 120/60 133/75  (!) 143/73  Pulse: 92 91 86 81  Resp:      Temp: 98 F (36.7 C) 97.8 F (36.6 C)  98.1 F (36.7 C)  TempSrc: Oral Oral  Oral  SpO2: 96% 97%  97%  Weight:      Height:        Intake/Output Summary (Last 24 hours) at 11/18/2019 0756 Last data filed at 11/18/2019 0616 Gross per 24 hour  Intake 1696.08 ml  Output 1550 ml  Net 146.08 ml   Last 3 Weights 11/17/2019 11/15/2019 11/15/2019  Weight (lbs) 244 lb 11.2 oz 247 lb 11.2 oz 247 lb 3.2 oz  Weight (kg) 110.995 kg 112.356 kg 112.129 kg      Telemetry    NSR without significant ventricular ectopy - Personally Reviewed  ECG    NSR without  significant ST-T wave changes, poor R wave progression in the anterior leads - Personally Reviewed  Physical Exam   GEN: No acute distress.   Neck: No JVD Cardiac: RRR, no murmurs, rubs, or gallops.  Respiratory: Clear to auscultation bilaterally. GI: Soft, nontender, non-distended  MS: No edema; No deformity. Neuro:  Nonfocal  Psych: Normal affect   Labs    High Sensitivity Troponin:   Recent Labs  Lab 11/15/19 1559  TROPONINIHS 6      Chemistry Recent Labs  Lab 11/15/19 1559 11/15/19 2043 11/16/19 0502  NA  --   --  136  K  --   --  4.2  CL  --   --  104  CO2  --   --  23  GLUCOSE  --   --  253*  BUN  --   --  13  CREATININE 0.79 0.76 0.85  CALCIUM  --   --  9.1  GFRNONAA >60 >60 >60  GFRAA >60 >60 >60  ANIONGAP  --   --  9     Hematology Recent Labs  Lab 11/15/19 1559 11/15/19 2043 11/16/19 0502  WBC 4.8 7.6 5.1  RBC 3.52* 3.69* 3.48*  HGB 12.4* 13.0 12.1*  HCT 36.3* 39.1 36.9*  MCV 103.1* 106.0* 106.0*  MCH 35.2* 35.2* 34.8*  MCHC 34.2 33.2 32.8  RDW 14.7 14.7 14.5  PLT 126* 141* 143*    BNP Recent Labs  Lab 11/15/19 1559 11/16/19 0502  BNP 37.0 42.8     DDimer No results for input(s): DDIMER in the last 168 hours.   Radiology    CT CORONARY MORPH W/CTA COR W/SCORE W/CA W/CM &/OR WO/CM  Addendum Date: 11/16/2019   ADDENDUM REPORT: 11/16/2019 19:37 CLINICAL DATA:  Prosthetic Valve Endocarditis. EXAM: Cardiac CT TECHNIQUE: The patient was scanned on a [employer]. A 120 kV retrospective scan was triggered in the descending thoracic aorta at 111 HU's. Gantry rotation speed was 250 msecs and collimation was .6 mm. No beta blockade was given. 0.8 mg of nitroglycerin was given. The 3D data set was reconstructed in 5% intervals of the R-R cycle. Systolic and diastolic phases were analyzed on a dedicated work station using MPR, MIP and VRT modes. The patient received 80 cc of contrast. FINDINGS: Image quality: Excellent. Noise artifact is:  Limited. Aortic Valve: A 23 mm prosthetic valve is present in the aortic position. The valve appears to be a magna ease pericardial valve. There is a small (5 mm x 3 mm) mass-like structure attached to the [place] leaflet. The mass is not freely mobile and moves with the leaflet. The HU are >145 suggestive of a fibrous tissue, likely representing healed vegetation in this patient with prior endocarditis. There is no restriction of leaflet motion. There is a small extravasation (5.7 mm x 4.6 mm) of contrast behind the non-coronary cusp of the aortic root near the sewing ring of the prosthetic valve directed toward the IAS. This likely represents a pseudoaneurysm/abscess in this patient with prior endocarditis. There are no fistulous connections observed. Coronary Arteries: Coronary calcium score not performed due to history of prior bypass. Normal coronary origin. Right dominance. Bypass Grafts: A patent LIMA to distal LAD is seen. A patent SVG to OM1 is present. Left main: The left main is a large caliber vessel with a normal take off from the left coronary cusp that trifurcates into a LAD, LCX, and ramus intermedius. There is minimal calcified plaque (<25%). Left anterior descending artery: The proximal LAD is severely calcified with a severe stenosis (70-99%). The mid LAD is diffusely diseased with mild calcified plaque (25-49%). A patent LIMA graft is seen to the distal LAD. Ramus intermedius: Moderate calcified plaque (50-69%). Left circumflex artery: The LCX is non-dominant. The proximal LCX contains mild calcified plaque (25-49%). OM1 contains a moderate stenosis (50-69%). A patent SVG is present to the OM1 branch. Right coronary artery: The RCA is dominant with normal take off from the right coronary cusp. The proximal RCA contains minimal calcified plaque (<25%). The mid RCA contains mild calcified plaque (<25%). The distal RCA contains mild calcified plaque (25-49%). The PDA is diffusely diseased with moderate  (50-69%) mixed density plaque. The RPLV branch contains a severe stenosis (70-99%) with plaque of mixed density. Cardiac Morphology: Right Atrium: Right atrial size is within normal limits. Right Ventricle: The right ventricular cavity is within normal limits. Left Atrium: Left atrial size is normal in size with no left atrial appendage filling defect. Left Ventricle: The ventricular cavity size is within normal limits. There are no stigmata of prior infarction. The left ventricle is hyperdynamic with LVEF = 83%. Chordal SAM is present. Postoperative septal movement is noted. No regional wall  motion abnormalities. Pulmonary arteries: Normal in size without proximal filling defect. Pulmonary veins: Normal pulmonary venous drainage. Pericardium: Normal thickness with no significant effusion or calcium present. Mitral Valve: The mitral valve is normal structure without significant calcification. Extra-cardiac findings: See attached radiology report for non-cardiac structures. IMPRESSION: 1. 23 mm prosthetic aortic valve with evidence of a small mass on the leaflet of the [place] which represents a likely healed vegetation. The mass is not mobile and the leaflets are freely moveable without evidence of destruction. 2. There is evidence of a small pseudoaneurysm (5.7 mm x 4.6 mm) of the [place] consistent with aortic root abscess as described above. 3. Severe native 3-vessel CAD. 4. Patent LIMA-LAD and SVG-OM1. [place] T. Audie Box, MD Electronically Signed   By: Eleonore Chiquito   On: 11/16/2019 19:37   Result Date: 11/16/2019 EXAM: OVER-READ INTERPRETATION  CT CHEST The following report is an over-read performed by radiologist Dr. Vinnie Langton of [facility], [place] on 11/16/2019. This over-read does not include interpretation of cardiac or coronary anatomy or pathology. The coronary calcium score/coronary CTA interpretation by the cardiologist is attached. COMPARISON:  None. FINDINGS: Aortic atherosclerosis. Within the  visualized portions of the thorax there are no suspicious appearing pulmonary nodules or masses, there is no acute consolidative airspace disease, no pleural effusions, no pneumothorax and no lymphadenopathy. Visualized portions of the upper abdomen are unremarkable. There are no aggressive appearing lytic or blastic lesions noted in the visualized portions of the skeleton. Status post median sternotomy for CABG and aortic valve replacement with a stented bioprosthesis. IMPRESSION: 1.  Aortic Atherosclerosis (ICD10-I70.0). Electronically Signed: By: Vinnie Langton M.D. On: 11/16/2019 14:40    Cardiac Studies   TEE 11/15/2019 IMPRESSIONS    1. Left ventricular ejection fraction, by estimation, is 55 to 60%. The  left ventricle has normal function. The left ventricle has no regional  wall motion abnormalities.  2. Right ventricular systolic function is normal. The right ventricular  size is normal.  3. No left atrial/left atrial appendage thrombus was detected.  4. The mitral valve is normal in structure. Trivial mitral valve  regurgitation.  5. Echodensity on the aortic valve prosthesis is slightly larger compared  to 08/2019 and appears more organized (measures 1.1 x 0.7 cm). There is  echolucency adjacent to the annulus of the AVR along the left coronary  cusp. Developing perivalvular abscess  cannot be excluded.. The aortic valve has been repaired/replaced. Aortic  valve regurgitation is trivial. Echo findings are consistent with  vegetation of the aortic prosthesis.  6. There is mild (Grade II) plaque involving the descending aorta.    Cardiac CT 11/16/2019 IMPRESSION: 1. 23 mm prosthetic aortic valve with evidence of a small mass on the leaflet of the [place] which represents a likely healed vegetation. The mass is not mobile and the leaflets are freely moveable without evidence of destruction.  2. There is evidence of a small pseudoaneurysm (5.7 mm x 4.6 mm) of the [place]  consistent with aortic root abscess as described above.  3. Severe native 3-vessel CAD.  4. Patent LIMA-LAD and SVG-OM1.  Patient Profile     74 y.o. male with PMH of CAD s/p CABG 2017 (LIMA-LAD and SVG-OM), AS s/p bioprosthetic AVR 2017, HTN, HLD, DM II, OSA not on CPAP and tobacco use who was admitted following TEE concerning for enlarging bioprosthetic AVR vegetation and possible perivalvular abscess. He has been admitted several times in the past 6 month with enterococcal bacteremia. Previous TEE in 08/2019  showed small vegetation and he received 6 weeks of IV abx.   Assessment & Plan    1. SBE: previously finished 6 weeks of IV antibiotic, then transitioned to amoxicillin  - negative blood culture on 11/08/2019  - TEE obtained on 11/15/2019 showed EF 55-60%, echodensity on the aortic valve prosthesis is slightly larger compare to 08/2019 and appears to be more organized (1.1 x 0.7cm), echolucency adjacent to annulus of AVR along the left coronary cusp concerning for developing perivalvular abscess.   - cardiac CT 11/16/2019 showed 23 mm prosthetic aortic valve with evidence of small mass on the leaflet which likely represent healed vegetation. There is also a small pseudonaeurysm of [place] consistent with aortic root abscess.  - seen by ID and CVTS yesterday. Dr. Orvan Seen recommended continue observation and outpatient evaluation in a few weeks. ID recommended restart IV ampicillin and cetriaxone.   - likely discharge unless further workup planned. Will need to consider timing of repeat TEE vs cardiac CT  2. CAD s/p CABG 2017: last myoview in Nov 2020 was low risk. Coronary CT showed patent grafts. Recent chest pain atypical, continue to observe  3. AS s/p bioprosthetic AVR 2017: see echo above  4. HTN  5. HLD: on statin  6. DM II  7. R groin and abdomen itchiness: he has this has been going on for several weeks after transitioned from IV antibiotic to oral antibiotic amoxicillin, however  after examined by ID, felt the rash is more related to tinea corporis.     For questions or updates, please contact [place] Please consult [email or web address] for contact info under        Signed, Almyra Deforest, [place]  11/18/2019, 7:56 AM

## 2019-11-18 NOTE — Discharge Summary (Signed)
Discharge Summary    Patient ID: Jonathan Cross [facility]. MRN: [id number]; DOB: [birth year]  Admit date: 11/15/2019 Discharge date: 11/18/2019  Primary Care Provider: Georges Mouse, MD  Primary Cardiologist: Nelva Bush, MD  Primary Electrophysiologist:  None   Discharge Diagnoses    Principal Problem:   Aortic valve endocarditis Active Problems:   Bacteremia due to Enterococcus   Coronary artery disease of native artery of native heart with stable angina pectoris [name])   Essential hypertension   History of aortic valve replacement with bioprosthetic valve   Type 2 diabetes mellitus without complication, with long-term current use of insulin (HCC)   Hyperlipidemia LDL goal <70   Tinea corporis    Diagnostic Studies/Procedures    TEE 11/15/2019 IMPRESSIONS    1. Left ventricular ejection fraction, by estimation, is 55 to 60%. The  left ventricle has normal function. The left ventricle has no regional  wall motion abnormalities.  2. Right ventricular systolic function is normal. The right ventricular  size is normal.  3. No left atrial/left atrial appendage thrombus was detected.  4. The mitral valve is normal in structure. Trivial mitral valve  regurgitation.  5. Echodensity on the aortic valve prosthesis is slightly larger compared  to 08/2019 and appears more organized (measures 1.1 x 0.7 cm). There is  echolucency adjacent to the annulus of the AVR along the left coronary  cusp. Developing perivalvular abscess  cannot be excluded.. The aortic valve has been repaired/replaced. Aortic  valve regurgitation is trivial. Echo findings are consistent with  vegetation of the aortic prosthesis.  6. There is mild (Grade II) plaque involving the descending aorta.    Cardiac CT 11/16/2019 IMPRESSION: 1. 23 mm prosthetic aortic valve with evidence of a small mass on the leaflet of the [place] which represents a likely healed vegetation. The mass is not mobile and the  leaflets are freely moveable without evidence of destruction.  2. There is evidence of a small pseudoaneurysm (5.7 mm x 4.6 mm) of the [place] consistent with aortic root abscess as described above.  3. Severe native 3-vessel CAD.  4. Patent LIMA-LAD and SVG-OM1. _____________   History of Present Illness     Jonathan Cross. is a 74 y.o. male with two-vessel CABG with LIMA to LAD and SVG to OM with bioprosthetic AVR at [name] on [id number] complicated by endocarditis of the bioprosthetic aortic valve earlier in 2021, DM2, HTN, HLD, tobacco use, GERD, and OSA not on CPAP who presented to [name] for outpatient TEE.  [name] underwent diagnostic LHC at [name] in 7 which revealed significant multivessel CAD and severe aortic stenosis. In the setting, he underwent two-vessel CABG and bioprosthetic AVR on 02/11/2016 at [name]. Most recent ischemic evaluation via [name] in 05/2019 for anginal symptoms was a low risk with a normal EF and no evidence of ischemia. He was admitted to the hospital in 04/2019 with enterococcal bacteremia felt to be UTI in etiology complicated by nephrolithiasis. Surface echo and TEE at that time did not show any evidence of vegetations. He was treated with IV antibiotics. He was readmitted to the hospital in 06/2019 following stage ureteroscopy and laser lithotripsy with stent exchange with recurrent Enterococcus bacteremia. Again 2D surface echo and TEE showed no evidence of vegetation. He was admitted in 07/2019 for a third time with enterococcal bacteremia with TEE on 08/17/2019 demonstrating a 10 x 5 mm mobile mass noted on the prosthetic aortic valve consistent with vegetation. He was evaluated by ID  and again treated with IV antibiotics with recommendation to continue ampicillin and ceftriaxone for a total of 6 weeks and remained on suppressive therapy with oral amoxicillin. Repeat TEE was recommended prior to removal of PICC line, though it appears  this was not completed. He was seen in the ED on [id number] with biliary colic with recommendation for robotic cholecystectomy by general surgery.  Blood culture obtained on 11/08/2019 was negative.  More recently, he obtained a repeat TEE on 11/15/2019 which showed EF 55-60%, echodensity on the aortic valve prosthesis is slightly larger compare to 08/2019 and appears to be more organized (1.1 x 0.7cm), echolucency adjacent to annulus of AVR along the left coronary cusp concerning for developing perivalvular abscess.  This was reviewed by Dr. Saunders Revel was concerned that a TEE study represented progression of bioprosthetic AVR involvement.  Patient was subsequently admitted to [name] and transferred to [name]     Consultants: Infectious disease (Dr. Graylon Good and Dr. Megan Salon), CT surgery (Dr. Orvan Seen)  After the patient was transferred to [name], he has been evaluated by both infectious disease and CT surgery. Cardiac CT obtained on 11/16/2019 showed 23 mm prosthetic aortic valve with evidence of small mass on the leaflet which likely represent healed vegetation. There is also a small pseudonaeurysm of [place] consistent with aortic root abscess.  He was seen by Dr. Orvan Seen who recommended continue observation given her asymptomatic status.  It is planned for him to have outpatient reevaluation in a few weeks.  Infectious disease service restarted IV ampicillin and ceftriaxone.    Patient was seen this morning by Dr. Debara Pickett at which time he denies any significant chest discomfort or shortness of breath.  He is deemed stable for discharge from cardiology perspective.  His recent chest pain has been atypical, he has a negative Myoview in November 2020 and a coronary CT obtained during this admission showed patent grafts.  He does have some tinea corporis, infectious disease recommended conservative management.   Prior to discharge, I discussed the case with our infectious disease doctor  Dr. Megan Salon who recommended discharge the patient on amoxicillin PO.  We also recommend close outpatient follow-up.  He will need 2 weeks follow-up with his infectious disease doctor Dr. Ramon Dredge, 2 weeks follow-up with general cardiology and 2 weeks follow-up with cardiothoracic surgeon at [facility].  Instead of following up with cardiothoracic surgeon here, family prefers to follow-up with cardiothoracic surgeon who did his initial valve procedure at [facility].  We will defer to his cardiothoracic surgeon to decide on the timing of repeat image.   _____________  Discharge Vitals Blood pressure (!) 143/73, pulse 81, temperature 98.1 F (36.7 C), temperature source Oral, resp. rate 18, height 5\' 9"  (1.753 m), weight 111 kg, SpO2 97 %.  Filed Weights   11/15/19 1807 11/17/19 0645  Weight: 112.4 kg 111 kg    Labs & Radiologic Studies    CBC Recent Labs    11/15/19 2043 11/16/19 0502  WBC 7.6 5.1  HGB 13.0 12.1*  HCT 39.1 36.9*  MCV 106.0* 106.0*  PLT 141* [id number]*   Basic Metabolic Panel Recent Labs    11/15/19 2043 11/16/19 0502  NA  --  136  K  --  4.2  CL  --  104  CO2  --  23  GLUCOSE  --  253*  BUN  --  13  CREATININE 0.76 0.85  CALCIUM  --  9.1   Liver Function Tests No results  for input(s): AST, ALT, ALKPHOS, BILITOT, PROT, ALBUMIN in the last 72 hours. No results for input(s): LIPASE, AMYLASE in the last 72 hours. High Sensitivity Troponin:   Recent Labs  Lab 11/15/19 1559  TROPONINIHS 6    BNP Invalid input(s): POCBNP D-Dimer No results for input(s): DDIMER in the last 72 hours. Hemoglobin A1C Recent Labs    11/16/19 0502  HGBA1C 9.5*   Fasting Lipid Panel No results for input(s): CHOL, HDL, LDLCALC, TRIG, CHOLHDL, LDLDIRECT in the last 72 hours. Thyroid Function Tests No results for input(s): TSH, T4TOTAL, T3FREE, THYROIDAB in the last 72 hours.  Invalid input(s): FREET3 _____________  CT CORONARY MORPH W/CTA COR W/SCORE W/CA W/CM &/OR WO/CM  Addendum  Date: 11/16/2019   ADDENDUM REPORT: 11/16/2019 19:37 CLINICAL DATA:  Prosthetic Valve Endocarditis. EXAM: Cardiac CT TECHNIQUE: The patient was scanned on a [employer]. A 120 kV retrospective scan was triggered in the descending thoracic aorta at 111 HU's. Gantry rotation speed was 250 msecs and collimation was .6 mm. No beta blockade was given. 0.8 mg of nitroglycerin was given. The 3D data set was reconstructed in 5% intervals of the R-R cycle. Systolic and diastolic phases were analyzed on a dedicated work station using MPR, MIP and VRT modes. The patient received 80 cc of contrast. FINDINGS: Image quality: Excellent. Noise artifact is: Limited. Aortic Valve: A 23 mm prosthetic valve is present in the aortic position. The valve appears to be a magna ease pericardial valve. There is a small (5 mm x 3 mm) mass-like structure attached to the [place] leaflet. The mass is not freely mobile and moves with the leaflet. The HU are >145 suggestive of a fibrous tissue, likely representing healed vegetation in this patient with prior endocarditis. There is no restriction of leaflet motion. There is a small extravasation (5.7 mm x 4.6 mm) of contrast behind the non-coronary cusp of the aortic root near the sewing ring of the prosthetic valve directed toward the IAS. This likely represents a pseudoaneurysm/abscess in this patient with prior endocarditis. There are no fistulous connections observed. Coronary Arteries: Coronary calcium score not performed due to history of prior bypass. Normal coronary origin. Right dominance. Bypass Grafts: A patent LIMA to distal LAD is seen. A patent SVG to OM1 is present. Left main: The left main is a large caliber vessel with a normal take off from the left coronary cusp that trifurcates into a LAD, LCX, and ramus intermedius. There is minimal calcified plaque (<25%). Left anterior descending artery: The proximal LAD is severely calcified with a severe stenosis (70-99%). The mid LAD  is diffusely diseased with mild calcified plaque (25-49%). A patent LIMA graft is seen to the distal LAD. Ramus intermedius: Moderate calcified plaque (50-69%). Left circumflex artery: The LCX is non-dominant. The proximal LCX contains mild calcified plaque (25-49%). OM1 contains a moderate stenosis (50-69%). A patent SVG is present to the OM1 branch. Right coronary artery: The RCA is dominant with normal take off from the right coronary cusp. The proximal RCA contains minimal calcified plaque (<25%). The mid RCA contains mild calcified plaque (<25%). The distal RCA contains mild calcified plaque (25-49%). The PDA is diffusely diseased with moderate (50-69%) mixed density plaque. The RPLV branch contains a severe stenosis (70-99%) with plaque of mixed density. Cardiac Morphology: Right Atrium: Right atrial size is within normal limits. Right Ventricle: The right ventricular cavity is within normal limits. Left Atrium: Left atrial size is normal in size with no left atrial appendage filling  defect. Left Ventricle: The ventricular cavity size is within normal limits. There are no stigmata of prior infarction. The left ventricle is hyperdynamic with LVEF = 83%. Chordal SAM is present. Postoperative septal movement is noted. No regional wall motion abnormalities. Pulmonary arteries: Normal in size without proximal filling defect. Pulmonary veins: Normal pulmonary venous drainage. Pericardium: Normal thickness with no significant effusion or calcium present. Mitral Valve: The mitral valve is normal structure without significant calcification. Extra-cardiac findings: See attached radiology report for non-cardiac structures. IMPRESSION: 1. 23 mm prosthetic aortic valve with evidence of a small mass on the leaflet of the [place] which represents a likely healed vegetation. The mass is not mobile and the leaflets are freely moveable without evidence of destruction. 2. There is evidence of a small pseudoaneurysm (5.7 mm x 4.6 mm)  of the [place] consistent with aortic root abscess as described above. 3. Severe native 3-vessel CAD. 4. Patent LIMA-LAD and SVG-OM1. [place] T. Audie Box, MD Electronically Signed   By: Eleonore Chiquito   On: 11/16/2019 19:37   Result Date: 11/16/2019 EXAM: OVER-READ INTERPRETATION  CT CHEST The following report is an over-read performed by radiologist Dr. Vinnie Langton of [facility], [place] on 11/16/2019. This over-read does not include interpretation of cardiac or coronary anatomy or pathology. The coronary calcium score/coronary CTA interpretation by the cardiologist is attached. COMPARISON:  None. FINDINGS: Aortic atherosclerosis. Within the visualized portions of the thorax there are no suspicious appearing pulmonary nodules or masses, there is no acute consolidative airspace disease, no pleural effusions, no pneumothorax and no lymphadenopathy. Visualized portions of the upper abdomen are unremarkable. There are no aggressive appearing lytic or blastic lesions noted in the visualized portions of the skeleton. Status post median sternotomy for CABG and aortic valve replacement with a stented bioprosthesis. IMPRESSION: 1.  Aortic Atherosclerosis (ICD10-I70.0). Electronically Signed: By: Vinnie Langton M.D. On: 11/16/2019 14:40   ECHO TEE  Result Date: 11/15/2019    TRANSESOPHOGEAL ECHO REPORT   Patient Name:   Jonathan Cross [facility]. Date of Exam: 11/15/2019 Medical Rec #:  [id number]              Height:       69.0 in Accession #:    [id number]             Weight:       250.0 lb Date of Birth:  [birth year]              BSA:          2.272 m Patient Age:    25 years               BP:           137/70 mmHg Patient Gender: M                      HR:           89 bpm. Exam Location:  [facility] Procedure: Transesophageal Echo, Color Doppler and Cardiac Doppler Indications:     Endocarditis  History:         Patient has prior history of Echocardiogram examinations, most                  recent 08/17/2019. Prior CABG; Risk  Factors:Hypertension and                  Diabetes. H/o AVR.  Sonographer:     Sherrie Sport RDCS (AE) Referring Phys:  [place] Phys: Harrell Gave End MD PROCEDURE: After discussion of the risks and benefits of a TEE, an informed consent was obtained from the patient. TEE procedure time was 20 minutes. The transesophogeal probe was passed without difficulty through the esophogus of the patient. Imaged were obtained with the patient in a left lateral decubitus position. Local oropharyngeal anesthetic was provided with viscous lidocaine. Sedation performed by performing physician. Patients was under conscious sedation during this procedure. Anesthetic administered: 2mcg of Fentanyl, 3mg  of Versed. Image quality was good. The patient's vital signs; including heart rate, blood pressure, and oxygen saturation; remained stable throughout the procedure. The patient developed no complications during the procedure. IMPRESSIONS  1. Left ventricular ejection fraction, by estimation, is 55 to 60%. The left ventricle has normal function. The left ventricle has no regional wall motion abnormalities.  2. Right ventricular systolic function is normal. The right ventricular size is normal.  3. No left atrial/left atrial appendage thrombus was detected.  4. The mitral valve is normal in structure. Trivial mitral valve regurgitation.  5. Echodensity on the aortic valve prosthesis is slightly larger compared to 08/2019 and appears more organized (measures 1.1 x 0.7 cm). There is echolucency adjacent to the annulus of the AVR along the left coronary cusp. Developing perivalvular abscess  cannot be excluded.. The aortic valve has been repaired/replaced. Aortic valve regurgitation is trivial. Echo findings are consistent with vegetation of the aortic prosthesis.  6. There is mild (Grade II) plaque involving the descending aorta. FINDINGS  Left Ventricle: Left ventricular ejection fraction, by estimation, is 55 to 60%.  The left ventricle has normal function. The left ventricle has no regional wall motion abnormalities. Right Ventricle: The right ventricular size is normal. Right ventricular systolic function is normal. Left Atrium: No left atrial/left atrial appendage thrombus was detected. Pericardium: The pericardium was not well visualized. Mitral Valve: The mitral valve is normal in structure. Trivial mitral valve regurgitation. Tricuspid Valve: The tricuspid valve is not well visualized. Aortic Valve: Echodensity on the aortic valve prosthesis is slightly larger compared to 08/2019 and appears more organized (measures 1.1 x 0.7 cm). There is echolucency adjacent to the annulus of the AVR along the left coronary cusp. Developing perivalvular abscess cannot be excluded. The aortic valve has been repaired/replaced. Aortic valve regurgitation is trivial. There is a 23 mm bioprosthetic valve present in the aortic position. Pulmonic Valve: The pulmonic valve was not well visualized. Aorta: The aortic root was not well visualized. There is mild (Grade II) plaque involving the descending aorta. IAS/Shunts: No atrial level shunt detected by color flow Doppler. Nelva Bush MD Electronically signed by Nelva Bush MD Signature Date/Time: 11/15/2019/5:33:08 PM    Final    Disposition   Pt is being discharged home today in good condition.  Follow-up Plans & Appointments    Follow-up Information    Elige Radon, MD. Schedule an appointment as soon as possible for a visit.   Specialty: Thoracic Surgery Why: You need followup with [facility] Cardiothoracic surgeron in 2 weeks Contact information: [address] [place] [address] [phone]        Arvil Chaco, PA-C Follow up on 12/02/2019.   Specialties: Physician Assistant, Cardiology Why: 8:30AM Cardiology visit Contact information: [place] [address] [phone]        Tsosie Billing, MD. Schedule an  appointment as soon as possible for a visit.   Specialty: Infectious Diseases Why: please schedule a 2 week  appt with Infectiour disease doctor Contact information: [place] [address] [phone]          Discharge Instructions    Diet - low sodium heart healthy   Complete by: As directed    Discharge instructions   Complete by: As directed    No strenuous activity, no lifting > 5 lbs. Call your infectious disease doctor if you started having any fever or chill   Increase activity slowly   Complete by: As directed       Discharge Medications   Allergies as of 11/18/2019      Reactions   Metformin Diarrhea   Loose stools even with XR      Medication List    TAKE these medications   amLODipine 5 MG tablet Commonly known as: NORVASC Take 5 mg by mouth daily.   amoxicillin 500 MG capsule Commonly known as: AMOXIL Take 1 capsule (500 mg total) by mouth in the morning and at bedtime.   aspirin EC 81 MG tablet Take 81 mg by mouth daily.   atorvastatin 80 MG tablet Commonly known as: LIPITOR Take 80 mg by mouth every evening.   AZO-CRANBERRY PO Take 1 tablet by mouth daily.   glipiZIDE 2.5 MG 24 hr tablet Commonly known as: GLUCOTROL XL Take 2.5 mg by mouth daily with breakfast.   insulin aspart 100 UNIT/ML injection Commonly known as: novoLOG Inject 0-5 Units into the skin at bedtime.   isosorbide mononitrate 60 MG 24 hr tablet Commonly known as: IMDUR Take 1 tablet (60 mg total) by mouth daily.   lisinopril 20 MG tablet Commonly known as: ZESTRIL Take 1 tablet (20 mg total) by mouth daily. Please call to schedule appointment for further refills. Thank you!   loratadine 10 MG tablet Commonly known as: CLARITIN Take 10 mg by mouth daily.   metoprolol succinate 25 MG 24 hr tablet Commonly known as: TOPROL-XL Take 1 tablet (25 mg total) by mouth 2 (two) times daily. What changed:   how much to take  when to take this     nitroGLYCERIN 0.4 MG SL tablet Commonly known as: NITROSTAT Place 0.4 mg under the tongue every 5 (five) minutes x 3 doses as needed for chest pain.   NovoLIN 70/30 ReliOn (70-30) 100 UNIT/ML injection Generic drug: insulin NPH-regular Human Inject 100 Units into the skin 2 (two) times daily.   omeprazole 20 MG capsule Commonly known as: PRILOSEC Take 20 mg by mouth every evening.   ondansetron 4 MG disintegrating tablet Commonly known as: ZOFRAN-ODT Take 4 mg by mouth every 8 (eight) hours as needed for nausea or vomiting.   oxyCODONE-acetaminophen 5-325 MG tablet Commonly known as: Percocet Take 1 tablet by mouth every 8 (eight) hours as needed. What changed: reasons to take this   Potassium Citrate 15 MEQ (1620 MG) Tbcr Take 2 tablets by mouth 2 (two) times daily. What changed: how much to take   terbinafine 1 % cream Commonly known as: LAMISIL Apply 1 application topically 2 (two) times daily as needed (ATHLETE'S FOOT).   terbinafine 1 % cream Commonly known as: LamISIL AT Apply 1 application topically 2 (two) times daily.   vitamin C with rose hips 500 MG tablet Take 1,000 mg by mouth daily.          Outstanding Labs/Studies   Outpatient TEE or Cardiac CT per CT surgery on followup  Duration of Discharge Encounter   Greater than 30 minutes including physician time.  Hilbert Corrigan, [place] 11/18/2019, 2:35 PM

## 2019-11-18 NOTE — Progress Notes (Signed)
[facility]  Date of Admission:  11/15/2019     Total days of antibiotics 3         ASSESSMENT:  Mr. Jonathan Cross continues to remain afebrile and without significant evidence of endocarditis with negative blood cultures. Spoke with Dr. Debara Pickett and there is less concern for perivalvular abscess as opposed to aneurysm which filled with most recent TEE. Given this information I am in favor of treating with continued amoxicillin for suppression and continued close follow up with Dr. Delaine Lame and having a low threshold for restarting treatment.   PLAN:  1. Change ampicillin and ceftriaxone back to oral amoxicillin.  2. Continue fluconazole weekly for one additional dose for presumed tinea corporis/cruris. 3. Follow up with Dr. Delaine Lame  Principal Problem:   Aortic valve endocarditis Active Problems:   Bacteremia due to Enterococcus   Coronary artery disease of native artery of native heart with stable angina pectoris (HCC)   Essential hypertension   History of aortic valve replacement with bioprosthetic valve   Type 2 diabetes mellitus without complication, with long-term current use of insulin (HCC)   Hyperlipidemia LDL goal <70   Tinea corporis   . amLODipine  5 mg Oral Daily  . vitamin C  1,000 mg Oral Daily  . aspirin EC  81 mg Oral Daily  . atorvastatin  80 mg Oral Daily  . Chlorhexidine Gluconate Cloth  6 each Topical Daily  . fluconazole  200 mg Oral Weekly  . glipiZIDE  2.5 mg Oral QAC breakfast  . heparin  5,000 Units Subcutaneous Q8H  . insulin aspart  0-5 Units Subcutaneous QHS  . insulin aspart protamine- aspart  100 Units Subcutaneous BID WC  . isosorbide mononitrate  60 mg Oral Daily  . lisinopril  20 mg Oral Daily  . metoprolol succinate  25 mg Oral BID  . pantoprazole  40 mg Oral Daily  . sodium chloride flush  10-40 mL Intracatheter Q12H    SUBJECTIVE:  Afebrile overnight with no acute events.   Allergies  Allergen Reactions   . Metformin Diarrhea    Loose stools even with XR     Review of Systems: Review of Systems  Constitutional: Negative for chills, fever and weight loss.  Respiratory: Negative for cough, shortness of breath and wheezing.   Cardiovascular: Negative for chest pain and leg swelling.  Gastrointestinal: Negative for abdominal pain, constipation, diarrhea, nausea and vomiting.  Skin: Negative for rash.      OBJECTIVE: Vitals:   11/17/19 1401 11/17/19 2002 11/17/19 2235 11/18/19 0536  BP: 120/60 133/75  (!) 143/73  Pulse: 92 91 86 81  Resp:      Temp: 98 F (36.7 C) 97.8 F (36.6 C)  98.1 F (36.7 C)  TempSrc: Oral Oral  Oral  SpO2: 96% 97%  97%  Weight:      Height:       Body mass index is 36.14 kg/m.  Physical Exam Constitutional:      General: He is not in acute distress.    Appearance: He is well-developed. He is not ill-appearing.  Cardiovascular:     Rate and Rhythm: Normal rate and regular rhythm.     Heart sounds: Normal heart sounds.     Comments: PICC line in left upper extremity. Dressing clean and dry. No evidence of infection.  Pulmonary:     Effort: Pulmonary effort is normal.     Breath sounds: Normal breath sounds.  Skin:  General: Skin is warm and dry.  Neurological:     Mental Status: He is alert and oriented to person, place, and time.  Psychiatric:        Behavior: Behavior normal.        Thought Content: Thought content normal.        Judgment: Judgment normal.     Lab Results Lab Results  Component Value Date   WBC 5.1 11/16/2019   HGB 12.1 (L) 11/16/2019   HCT 36.9 (L) 11/16/2019   MCV 106.0 (H) 11/16/2019   PLT 143 (L) 11/16/2019    Lab Results  Component Value Date   CREATININE 0.85 11/16/2019   BUN 13 11/16/2019   NA 136 11/16/2019   K 4.2 11/16/2019   CL 104 11/16/2019   CO2 23 11/16/2019    Lab Results  Component Value Date   ALT 44 11/08/2019   AST 34 11/08/2019   ALKPHOS 53 11/08/2019   BILITOT 0.8 11/08/2019      Microbiology: Recent Results (from the past 240 hour(s))  Blood culture (routine single)     Status: None   Collection Time: 11/08/19 10:04 AM   Specimen: BLOOD  Result Value Ref Range Status   Specimen Description BLOOD BLOOD RIGHT HAND  Final   Special Requests   Final    BOTTLES DRAWN AEROBIC AND ANAEROBIC Blood Culture adequate volume   Culture   Final    NO GROWTH 5 DAYS Performed at [facility], [address] [place], [place] [address]    Report Status 11/13/2019 FINAL  Final  Blood culture (routine single)     Status: None   Collection Time: 11/08/19 10:04 AM   Specimen: BLOOD  Result Value Ref Range Status   Specimen Description BLOOD BLOOD LEFT HAND  Final   Special Requests   Final    BOTTLES DRAWN AEROBIC AND ANAEROBIC Blood Culture adequate volume   Culture   Final    NO GROWTH 5 DAYS Performed at [facility], [address] [place], [place] [address]    Report Status 11/13/2019 FINAL  Final  SARS CORONAVIRUS 2 (TAT 6-24 HRS) Nasopharyngeal Nasopharyngeal Swab     Status: None   Collection Time: 11/11/19  1:57 PM   Specimen: Nasopharyngeal Swab  Result Value Ref Range Status   SARS Coronavirus 2 NEGATIVE NEGATIVE Final    Comment: (NOTE) SARS-CoV-2 target nucleic acids are NOT DETECTED. The SARS-CoV-2 RNA is generally detectable in upper and lower respiratory specimens during the acute phase of infection. Negative results do not preclude SARS-CoV-2 infection, do not rule out co-infections with other pathogens, and should not be used as the sole basis for treatment or other patient management decisions. Negative results must be combined with clinical observations, patient history, and epidemiological information. The expected result is Negative. Fact Sheet for Patients: SugarRoll.be Fact Sheet for Healthcare Providers: https://www.woods-mathews.com/ This test is not yet approved or cleared by  the [place] FDA and  has been authorized for detection and/or diagnosis of SARS-CoV-2 by FDA under an Emergency Use Authorization (EUA). This EUA will remain  in effect (meaning this test can be used) for the duration of the COVID-19 declaration under Section 56 4(b)(1) of the Act, 21 U.S.C. section 360bbb-3(b)(1), unless the authorization is terminated or revoked sooner. Performed at [facility], [place] [address] [place], [place] [address]   Culture, blood (Routine X 2) w Reflex to ID Panel     Status: None (Preliminary result)  Collection Time: 11/16/19 10:15 AM   Specimen: BLOOD LEFT HAND  Result Value Ref Range Status   Specimen Description BLOOD LEFT HAND  Final   Special Requests   Final    BOTTLES DRAWN AEROBIC ONLY Blood Culture adequate volume   Culture   Final    NO GROWTH 2 DAYS Performed at [facility], [address], [place], [place] [address]    Report Status PENDING  Incomplete  Culture, blood (Routine X 2) w Reflex to ID Panel     Status: None (Preliminary result)   Collection Time: 11/16/19 10:31 AM   Specimen: BLOOD RIGHT HAND  Result Value Ref Range Status   Specimen Description BLOOD RIGHT HAND  Final   Special Requests   Final    BOTTLES DRAWN AEROBIC AND ANAEROBIC Blood Culture adequate volume   Culture   Final    NO GROWTH 2 DAYS Performed at [facility], [place] [address] [place], [place] [address]    Report Status PENDING  Incomplete     Terri Piedra, NP [facility]  11/18/2019  9:57 AM

## 2019-11-21 LAB — CULTURE, BLOOD (ROUTINE X 2)
Culture: NO GROWTH
Culture: NO GROWTH
Special Requests: ADEQUATE
Special Requests: ADEQUATE

## 2019-11-25 ENCOUNTER — Other Ambulatory Visit: Payer: Self-pay | Admitting: Internal Medicine

## 2019-11-28 NOTE — Progress Notes (Deleted)
Office Visit    Patient Name: Jonathan Cross. Date of Encounter: 11/28/2019  Primary Care Provider:  Georges Mouse, MD Primary Cardiologist:  Nelva Bush, MD  Chief Complaint    No chief complaint on file.   74 yo male with history of hx of CAD with significant three-vessel CAD as outlined below, CABG, AS s/p bioprosthetic aortic valve replacement [facility], 2017), hypertension, hyperlipidemia, previous history of smoking (quit 40 years ago), DM2, GERD, OSA not on CPAP, kidney stone, history of previous bacteremia / sepsis, bacteremia, and here today for follow-up.  Past Medical History    Past Medical History:  Diagnosis Date  . 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 Coronary artery disease   . Diabetes ([place])   . H/O aortic valve replacement   . History of kidney stones   . Hx of CABG   . Hyperlipidemia   . Hypertension   . Sleep apnea    Past Surgical History:  Procedure Laterality Date  . AORTIC VALVE REPLACEMENT  2017   [facility], bioprosthetic  . APPENDECTOMY    . CARDIAC VALVE REPLACEMENT    . CORONARY ARTERY BYPASS GRAFT  2017   [facility] and SVG-OM  . CYSTOSCOPY W/ RETROGRADES Right 05/27/2019   Procedure: CYSTOSCOPY WITH RETROGRADE PYELOGRAM;  Surgeon: Billey Co, MD;  Location: [facility];  Service: Urology;  Laterality: Right;  . CYSTOSCOPY W/ RETROGRADES Right 07/11/2019   Procedure: CYSTOSCOPY WITH RETROGRADE PYELOGRAM;  Surgeon: Billey Co, MD;  Location: [facility];  Service: Urology;  Laterality: Right;  . CYSTOSCOPY/URETEROSCOPY/HOLMIUM LASER/STENT PLACEMENT Right 05/27/2019   Procedure: CYSTOSCOPY/URETEROSCOPY/HOLMIUM LASER/STENT PLACEMENT;  Surgeon: Billey Co, MD;  Location: [facility];  Service: Urology;  Laterality: Right;  . CYSTOSCOPY/URETEROSCOPY/HOLMIUM LASER/STENT PLACEMENT Right 06/17/2019   Procedure: CYSTOSCOPY/URETEROSCOPY/HOLMIUM LASER/STENT Exchange;  Surgeon: Billey Co, MD;  Location: [facility];  Service: Urology;   Laterality: Right;  . CYSTOSCOPY/URETEROSCOPY/HOLMIUM LASER/STENT PLACEMENT Right 07/11/2019   Procedure: CYSTOSCOPY/URETEROSCOPY/HOLMIUM LASER/STENT Exchange;  Surgeon: Billey Co, MD;  Location: [facility];  Service: Urology;  Laterality: Right;  . STONE EXTRACTION WITH BASKET Right 07/11/2019   Procedure: STONE EXTRACTION WITH BASKET;  Surgeon: Billey Co, MD;  Location: [facility];  Service: Urology;  Laterality: Right;  . TEE WITHOUT CARDIOVERSION N/A 04/21/2019   Procedure: TRANSESOPHAGEAL ECHOCARDIOGRAM (TEE);  Surgeon: Minna Merritts, MD;  Location: [facility];  Service: Cardiovascular;  Laterality: N/A;  . TEE WITHOUT CARDIOVERSION N/A 07/04/2019   Procedure: TRANSESOPHAGEAL ECHOCARDIOGRAM (TEE);  Surgeon: Wellington Hampshire, MD;  Location: [facility];  Service: Cardiovascular;  Laterality: N/A;  . TEE WITHOUT CARDIOVERSION N/A 08/17/2019   Procedure: TRANSESOPHAGEAL ECHOCARDIOGRAM (TEE);  Surgeon: Kate Sable, MD;  Location: [facility];  Service: Cardiovascular;  Laterality: N/A;  . TEE WITHOUT CARDIOVERSION N/A 11/15/2019   Procedure: TRANSESOPHAGEAL ECHOCARDIOGRAM (TEE);  Surgeon: Nelva Bush, MD;  Location: [facility];  Service: Cardiovascular;  Laterality: N/A;  . TONSILLECTOMY      Allergies  Allergies  Allergen Reactions  . Metformin Diarrhea    Loose stools even with XR    History of Present Illness    Jonathan Cross. is a 74 y.o. male with PMH as above.  He has a history of significant three-vessel CAD with cath 10/05/2017 at [facility].  He underwent CABG and bioprosthetic aortic valve replacement in 2017.  He quit smoking approximately 40 years ago.  He does not drink alcohol or use illegal drugs. He works with heavy metal.  He was seen in our office 05/12/2019 for chest pain and preoperative cardiovascular evaluation before a lithotripsy on 05/27/2019.  At that time, he reported chronic chest pain ever since CABG/AVR in 2017 and is outlined below.  He was  told that small vessel disease was contributing to his discomfort. He felt relief with sublingual nitroglycerin though noted CP was occurring more frequent recently.  He reported intermittent orthostatic lightheadedness without syncope, which worsened when he leaned over to pick up an object.  He underwent Lexiscan Myoview 05/2019 that was ruled a low risk study with hyperdynamic EF and no evidence for ischemia.  In early 2021, he was admitted for bacteremia and endocarditis. TEE showed 5*10 mm mobile echodensity adjacent to his bioprosthetic aortic valve consistent with vegetation, Per ID recommendations, he completed 6 weeks IV ampicillin and ceftriaxone and remained on suppressive oral therapy with amoxicillin. Repeat TEE was recommended prior to removal of PICC line but not done. He then presented to the ED in early April 2021 and workup done for gallstones. Robotic cholecystectomy was recommended. He was seen in the office 11/02/2019 and reported swelling of the L lateral 3 toes for at least a week. He continued to report migratory CP, present ever since his surgery in 2017. The pain was both R and L sided and not exertional, lasting only seconds. He was using his SL nitro once every two weeks. Repeat TEE was recommended. He obtained a repeat TEE 11/2019 which showed EF 55-60%, echodensity on the aortic valve prosthesis slightly larger when compared with 08/2019 and along the L coronary cusp concerning for devloping perivalvular abscess. This was reviewed by Dr. Saunders Revel, who expressed concern that the TEE represented progression of the bioprosthetic AVR involvement and he was subsequently admitted to [facility] and transferred to [facility].   After the patient was transferred to [facility], he has been evaluated by both infectious disease and CT surgery. Cardiac CT obtained on 11/16/2019 showed 23 mm prosthetic aortic valve with evidence of small mass on the leaflet which likely represent healed vegetation.  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  He was seen by Dr. Orvan Seen who recommended continue observation given her asymptomatic status.  It is planned for him to have outpatient reevaluation in a few weeks.  Infectious disease service restarted IV ampicillin and ceftriaxone.    Patient was seen this morning by Dr. Debara Pickett at which time he denies any significant chest discomfort or shortness of breath.  He is deemed stable for discharge from cardiology perspective.  His recent chest pain has been atypical, he has a negative Myoview in November 2020 and a coronary CT obtained during this admission showed patent grafts.  He does have some tinea corporis, infectious disease recommended conservative management.   Prior to discharge, I discussed the case with our infectious disease doctor Dr. Megan Salon who recommended discharge the patient on amoxicillin PO.  We also recommend close outpatient follow-up.  He will need 2 weeks follow-up with his infectious disease doctor Dr. Ramon Dredge, 2 weeks follow-up with general cardiology and 2 weeks follow-up with cardiothoracic surgeon at [facility].  Instead of following up with cardiothoracic surgeon here, family prefers to follow-up with cardiothoracic surgeon who did his initial valve procedure at [facility].  We will defer to his cardiothoracic surgeon to decide on the timing of repeat image.    Home Medications    Prior to Admission medications   Medication Sig Start Date End Date Taking?  Authorizing Provider  amLODipine (NORVASC) 5 MG tablet Take 5 mg by mouth daily. 03/08/19   [provider]  amoxicillin (AMOXIL) 500 MG capsule Take 1 capsule (500 mg total) by mouth in the morning and at bedtime. 11/08/19   Tsosie Billing, MD  Ascorbic Acid (VITAMIN C WITH ROSE HIPS) 500 MG tablet Take 1,000 mg by mouth daily.     [provider]  aspirin EC 81 MG tablet Take 81 mg by mouth daily.  05/30/14   [provider]  atorvastatin (LIPITOR) 80 MG tablet Take 80 mg by mouth every evening.  08/13/18   [provider]  AZO-CRANBERRY PO Take 1 tablet by mouth daily.     [provider]  glipiZIDE (GLUCOTROL XL) 2.5 MG 24 hr tablet Take 2.5 mg by mouth daily with breakfast.  05/05/19   [provider]  insulin aspart (NOVOLOG) 100 UNIT/ML injection Inject 0-5 Units into the skin at bedtime. Patient not taking: Reported on 11/15/2019 04/23/19   Epifanio Lesches, MD  isosorbide mononitrate (IMDUR) 60 MG 24 hr tablet Take 1 tablet (60 mg total) by mouth daily. 05/12/19 11/15/19  End, Harrell Gave, MD  lisinopril (ZESTRIL) 20 MG tablet Take 1 tablet by mouth once daily 11/28/19   End, Harrell Gave, MD  loratadine (CLARITIN) 10 MG tablet Take 10 mg by mouth daily. 09/13/18 11/15/19  [provider]  metoprolol succinate (TOPROL-XL) 25 MG 24 hr tablet Take 1 tablet (25 mg total) by mouth 2 (two) times daily. 11/18/19   Almyra Deforest, PA  nitroGLYCERIN (NITROSTAT) 0.4 MG SL tablet Place 0.4 mg under the tongue every 5 (five) minutes x 3 doses as needed for chest pain.  03/24/18 11/15/19  [provider]  NOVOLIN 70/30 RELION (70-30) 100 UNIT/ML injection Inject 100 Units into the skin 2 (two) times daily.  05/13/19   [provider]  omeprazole (PRILOSEC) 20 MG capsule Take 20 mg by mouth every evening.  04/22/18 11/15/19  [provider]  ondansetron (ZOFRAN-ODT) 4 MG disintegrating tablet Take 4 mg by mouth every 8 (eight) hours as needed for nausea or vomiting.    [provider]  oxyCODONE-acetaminophen (PERCOCET) 5-325 MG tablet Take 1 tablet by mouth every 8 (eight) hours as needed. Patient taking differently: Take 1 tablet by mouth every 8 (eight) hours as needed (pain.).  10/19/19   Earleen Newport, MD  Potassium Citrate 15 MEQ (1620 MG) TBCR Take 2 tablets by mouth 2 (two) times daily. Patient taking differently: Take 1 tablet by mouth 2 (two)  times daily.  07/20/19   Billey Co, MD  terbinafine (LAMISIL AT) 1 % cream Apply 1 application topically 2 (two) times daily. 11/08/19   Tsosie Billing, MD  terbinafine (LAMISIL) 1 % cream Apply 1 application topically 2 (two) times daily as needed (ATHLETE'S FOOT).    [provider]    Review of Systems    ***.   All other systems reviewed and are otherwise negative except as noted above.  Physical Exam    VS:  There were no vitals taken for this visit. , BMI There is no height or weight on file to calculate BMI. GEN: Well nourished, well developed, in no acute distress. HEENT: normal. Neck: Supple, no JVD, carotid bruits, or masses. Cardiac: RRR, no murmurs, rubs, or gallops. No clubbing, cyanosis, edema.  Radials/DP/PT 2+ and equal bilaterally.  Respiratory:  Respirations regular and unlabored, clear to auscultation bilaterally. GI: Soft, nontender, nondistended, BS + x  4. MS: no deformity or atrophy. Skin: warm and dry, no rash. Neuro:  Strength and sensation are intact. Psych: Normal affect.  Accessory Clinical Findings    ECG personally reviewed by me today - *** - no acute changes.  VITALS Reviewed today   Temp Readings from Last 3 Encounters:  11/18/19 98.1 F (36.7 C) (Oral)  11/15/19 97.9 F (36.6 C)  11/08/19 97.8 F (36.6 C)   BP Readings from Last 3 Encounters:  11/18/19 (!) 143/73  11/15/19 (!) 171/64  11/08/19 (!) 152/79   Pulse Readings from Last 3 Encounters:  11/18/19 81  11/15/19 85  11/08/19 (!) 141    Wt Readings from Last 3 Encounters:  11/17/19 244 lb 11.2 oz (111 kg)  11/15/19 247 lb 3.2 oz (112.1 kg)  11/08/19 246 lb (111.6 kg)     LABS  reviewed today   CareEverwhere Labs present and most recent? {Yes/No:[id number]:::1}  Lab Results  Component Value Date   WBC 5.1 11/16/2019   HGB 12.1 (L) 11/16/2019   HCT 36.9 (L) 11/16/2019   MCV 106.0 (H) 11/16/2019   PLT 143 (L) 11/16/2019   Lab Results  Component  Value Date   CREATININE 0.85 11/16/2019   BUN 13 11/16/2019   NA 136 11/16/2019   K 4.2 11/16/2019   CL 104 11/16/2019   CO2 23 11/16/2019   Lab Results  Component Value Date   ALT 44 11/08/2019   AST 34 11/08/2019   ALKPHOS 53 11/08/2019   BILITOT 0.8 11/08/2019   No results found for: CHOL, HDL, LDLCALC, LDLDIRECT, TRIG, CHOLHDL  Lab Results  Component Value Date   HGBA1C 9.5 (H) 11/16/2019   No results found for: TSH   STUDIES/PROCEDURES reviewed today   ***  Assessment & Plan    ***  Medication changes: *** Labs ordered: *** Studies / Imaging ordered: *** Future considerations: *** Disposition: ***  Total time spent with patient today *** minutes. This includes reviewing records, evaluating the patient, and coordinating care. Face-to-face time >50%.    Arvil Chaco, PA-C 11/28/2019

## 2019-11-30 ENCOUNTER — Other Ambulatory Visit: Payer: Self-pay | Admitting: Surgery

## 2019-11-30 NOTE — Telephone Encounter (Signed)
Spoke with patient's son and advised him that his father would have to come back into our office for a follow up appointment from April 2021 or patient would have to contact his primary care provider's office and scheduled an appointment for the pain he is experiencing. Patient's son verbalized understanding.

## 2019-11-30 NOTE — Telephone Encounter (Signed)
Son, Jonathan Cross calls for his dad. States that his dad is out of the Oxycodone and his father is still having some additional pain.  Wanting to know if this can be refilled?  Please call son at [phone].  Thank you.

## 2019-12-02 ENCOUNTER — Ambulatory Visit: Payer: Medicare HMO | Admitting: Physician Assistant

## 2020-06-01 IMAGING — DX DG CHEST 1V PORT
1 series · 2 of 2 positions shown · non-contrast
Comparison: 06/26/2019

CLINICAL DATA: Sepsis.  Fever.  Cough.

EXAM:
PORTABLE CHEST 1 VIEW

[Series 1: chest ap · 0.14mm/px · 2 of 2 slices shown]
[im 1/2]
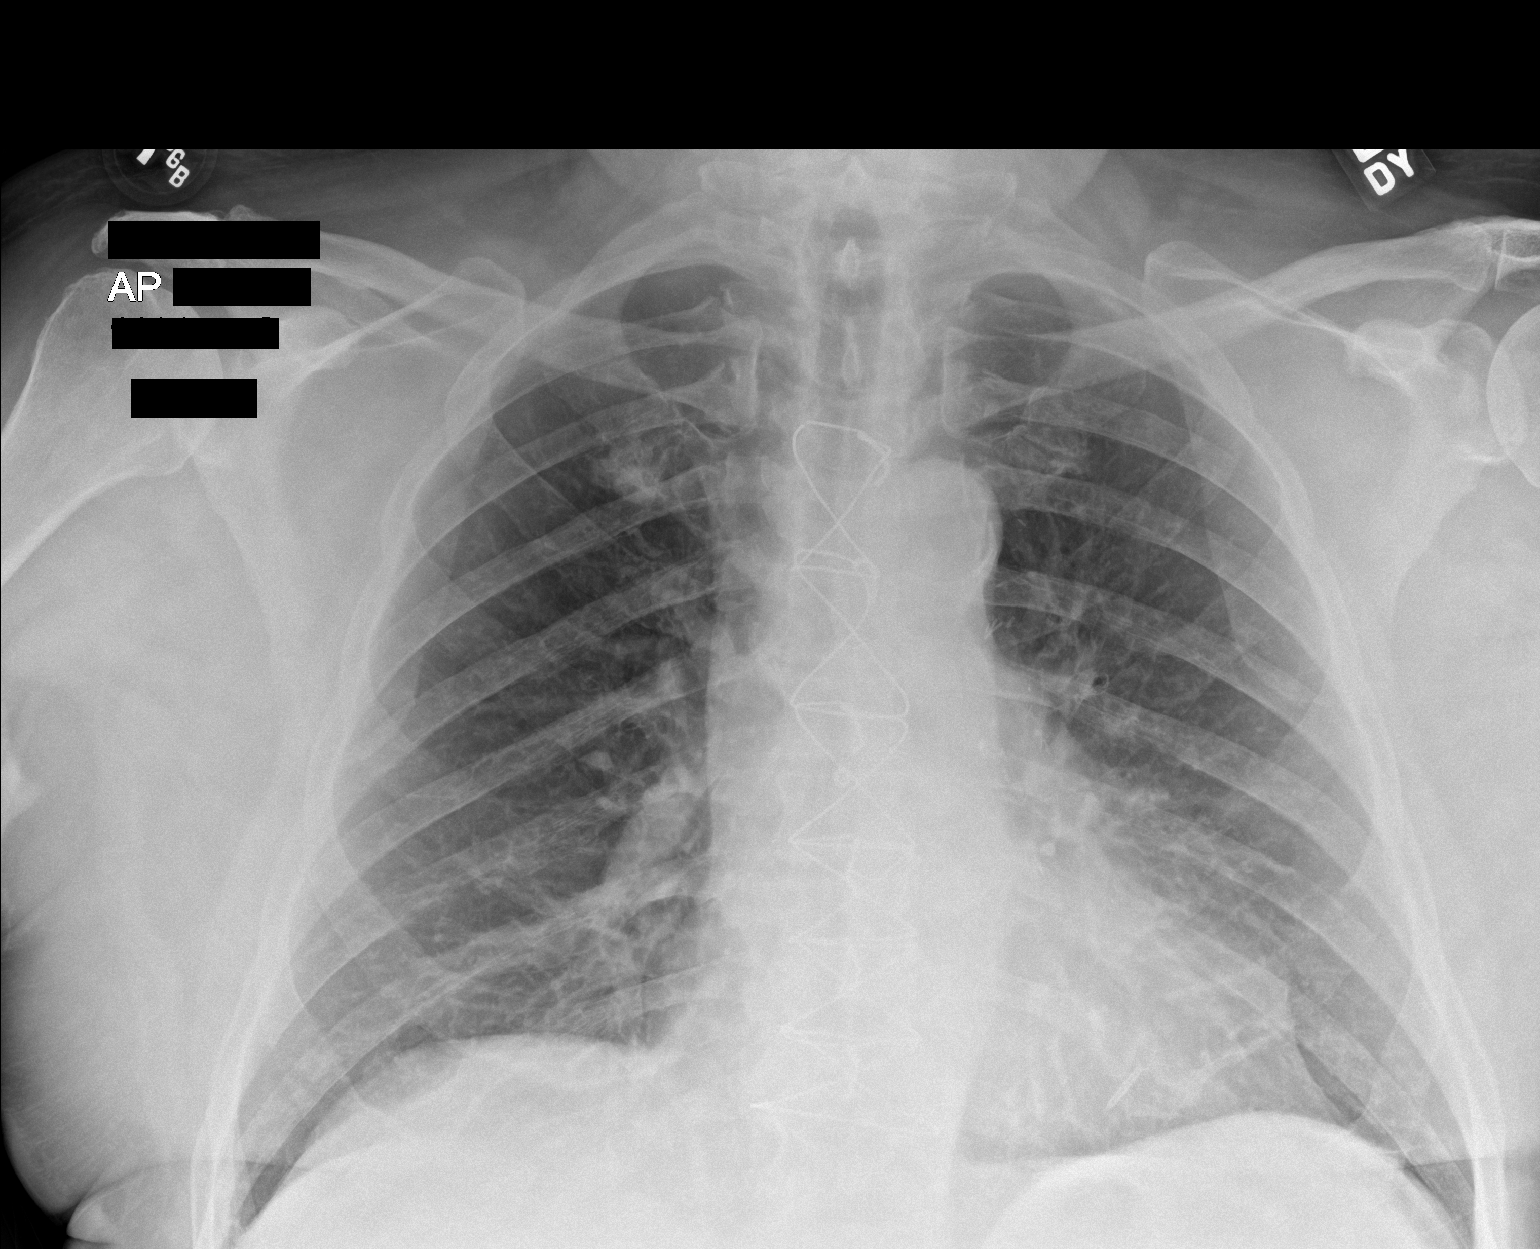
[im 2/2]
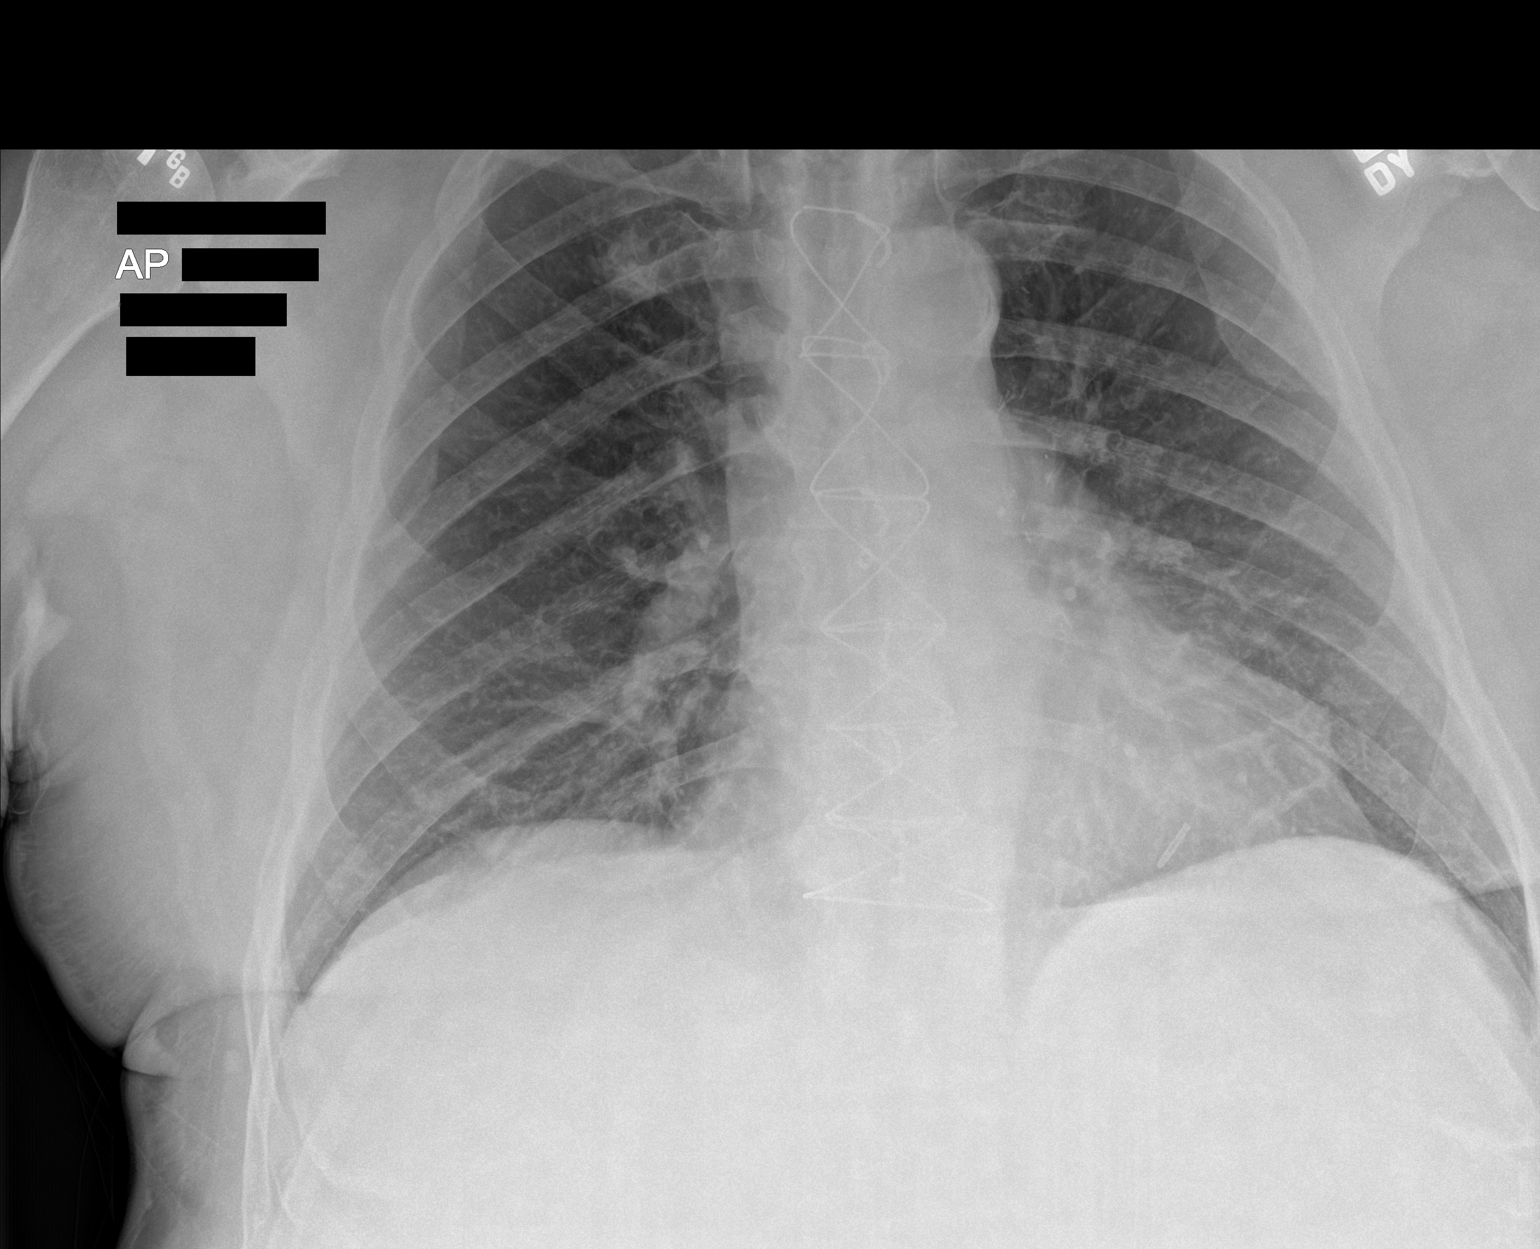

[2 of 2 positions shown; findings below may reference images not displayed]

FINDINGS: Prior median sternotomy. Midline trachea. Borderline cardiomegaly.
Atherosclerosis in the transverse aorta. No pleural effusion or
pneumothorax. Clear lungs.
IMPRESSION: No acute cardiopulmonary disease.

Aortic Atherosclerosis ([id number]-TDE.E).

## 2020-06-01 IMAGING — US US RENAL
1 series · 14 of 25 positions shown · non-contrast
Comparison: CT scan June 28, 2019

CLINICAL DATA: Evaluate for hydronephrosis. History of right kidney
stones.

EXAM:
RENAL / URINARY TRACT ULTRASOUND COMPLETE

[Series 1: us renal · 14 of 65 slices shown]
[im 1/65]
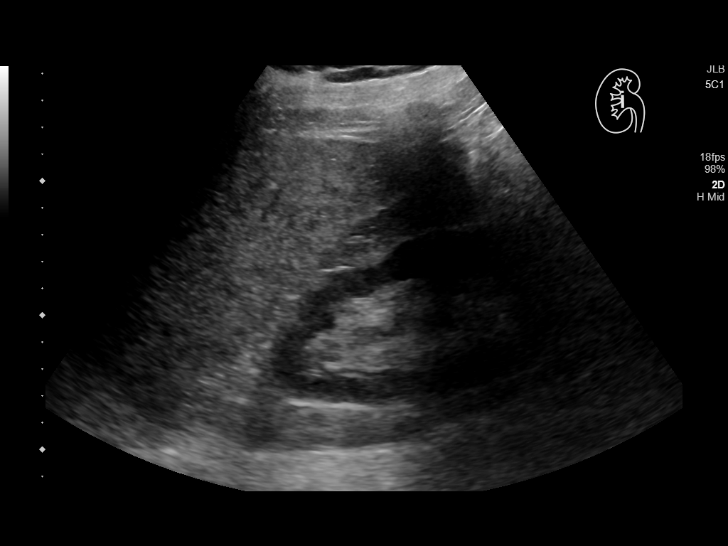
[im 6/65]
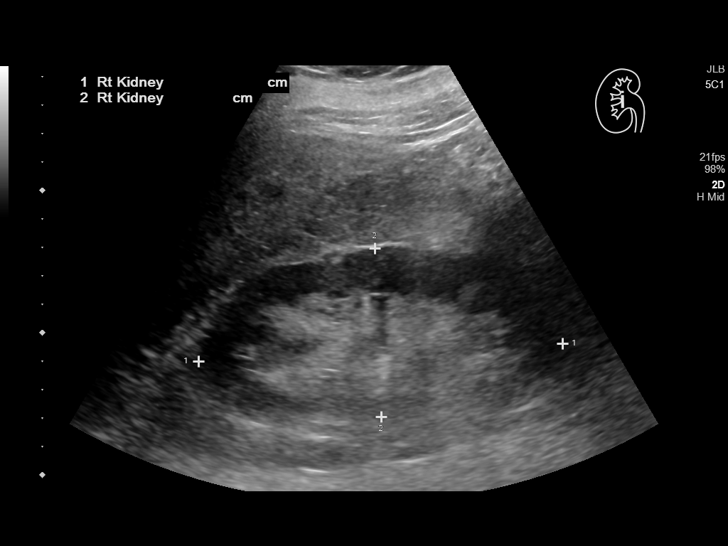
[im 11/65]
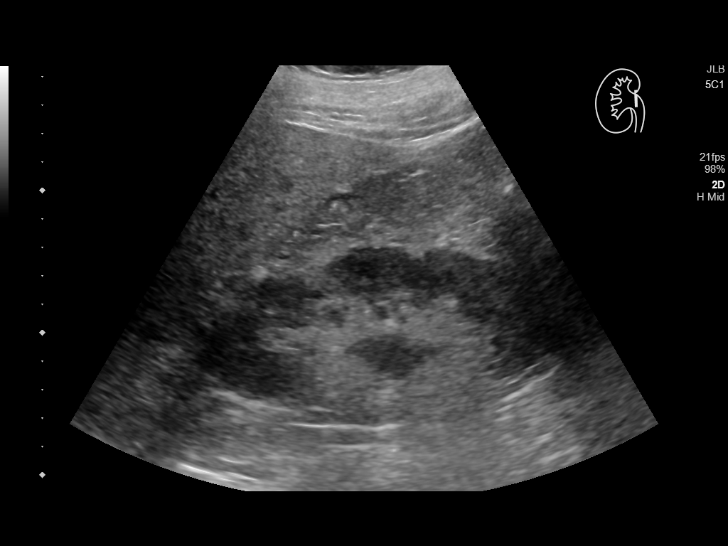
[im 17/65]
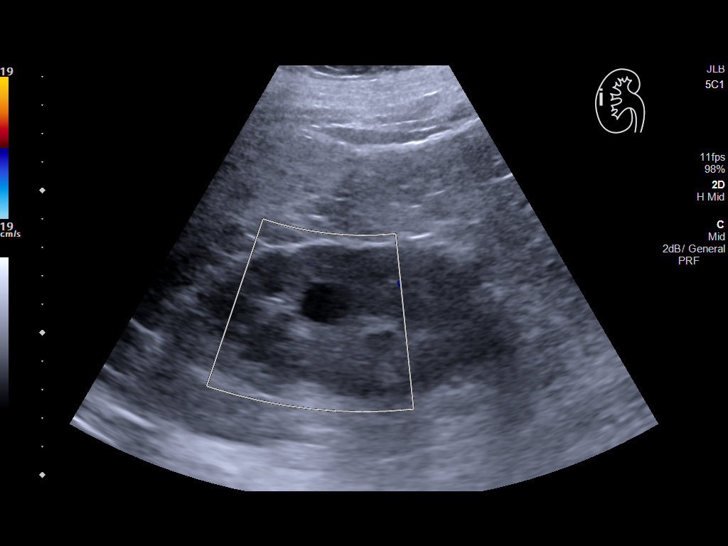
[im 22/65]
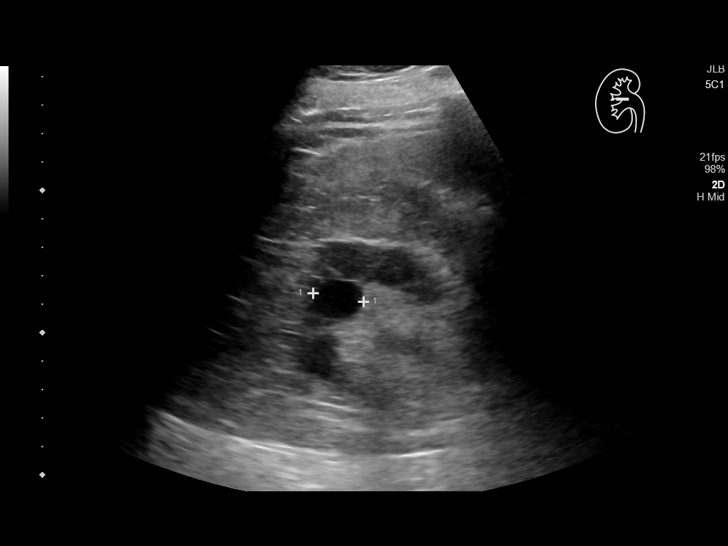
[im 25/65]
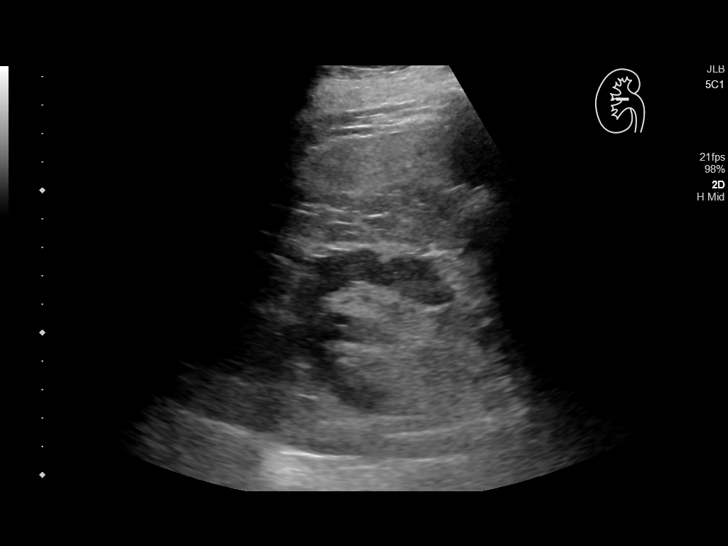
[im 30/65]
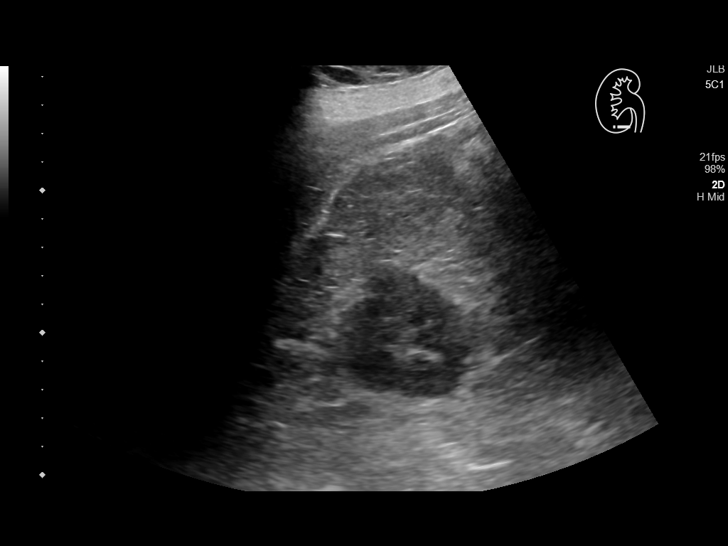
[im 35/65]
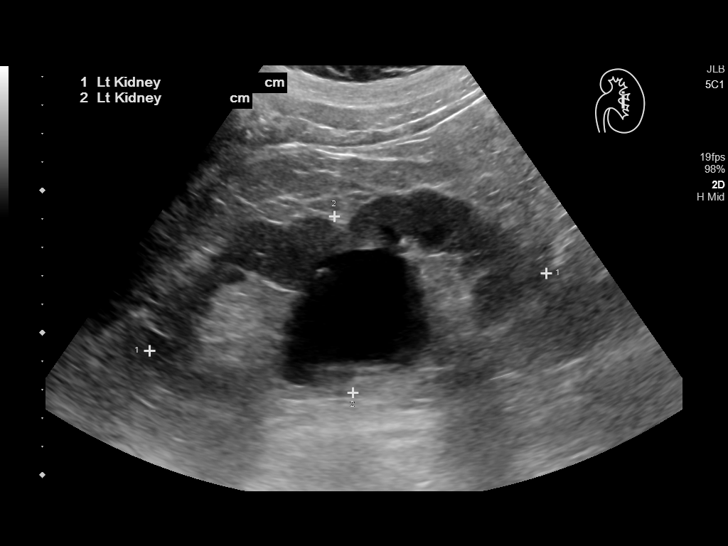
[im 41/65]
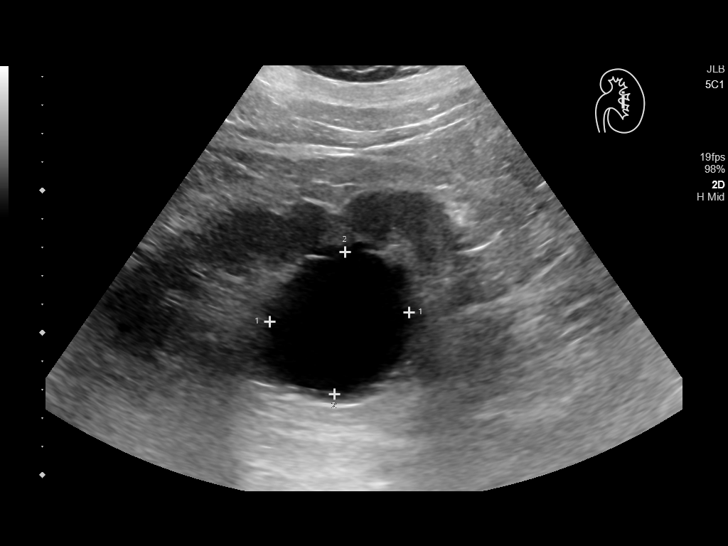
[im 43/65]
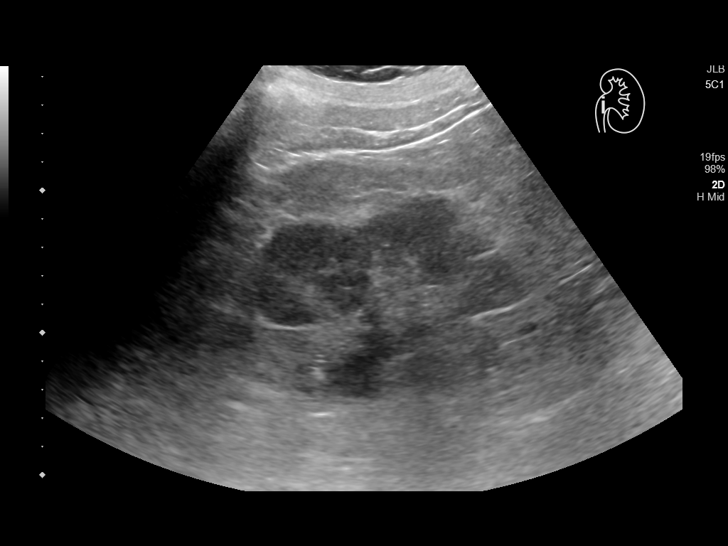
[im 49/65]
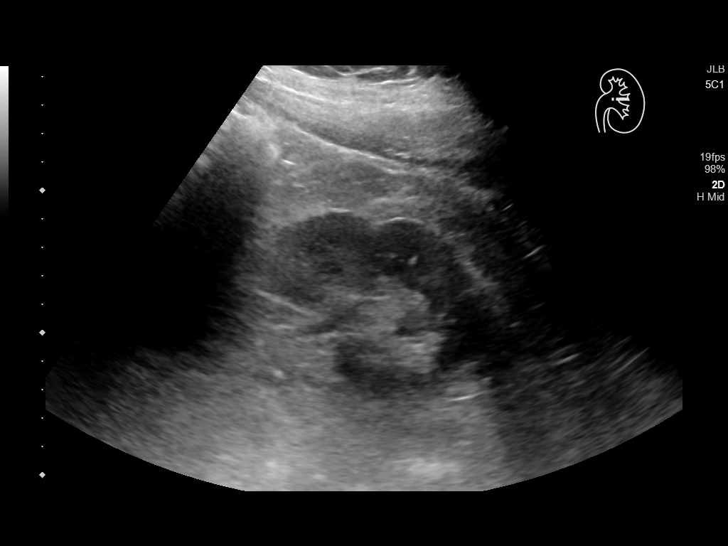
[im 54/65]
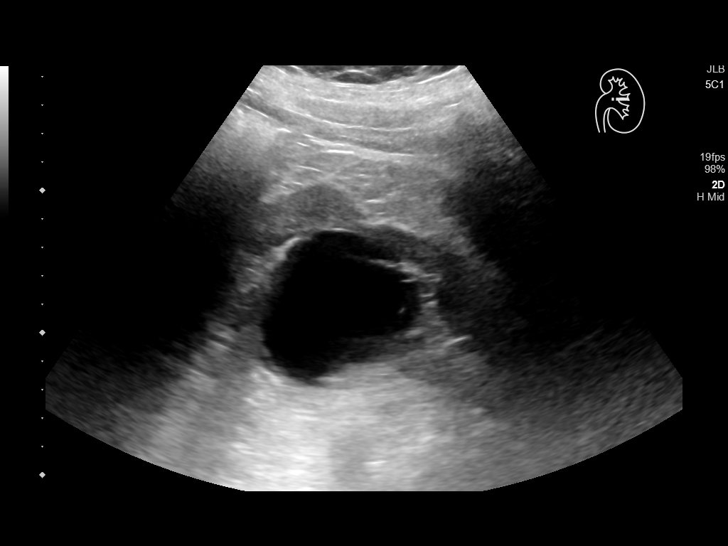
[im 59/65]
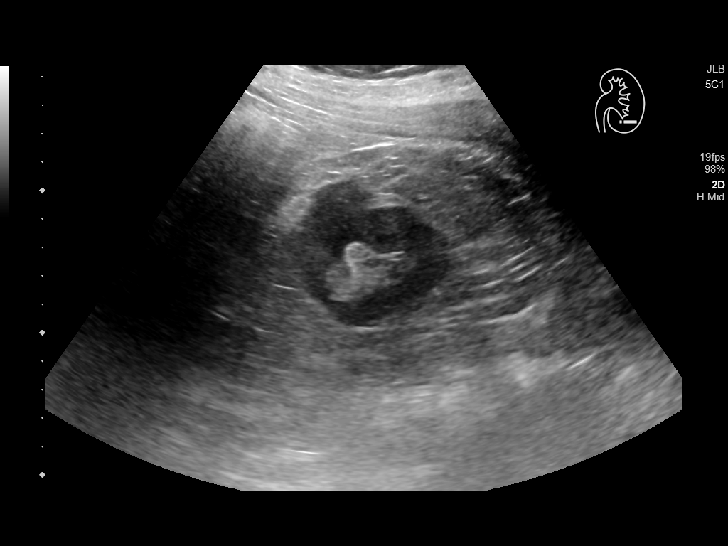
[im 65/65]
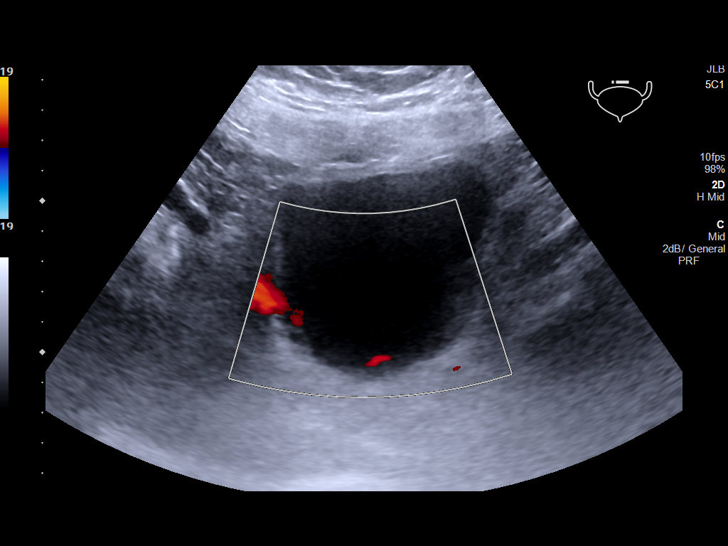

[14 of 25 positions shown; findings below may reference images not displayed]

FINDINGS: Right Kidney:

Renal measurements: 12.8 x 5.9 x 5.2 cm = volume: 206 mL. Contains a
1.8 cm cyst. Mild prominence of the right renal pelvis without
caliectasis.

Left Kidney:

Renal measurements: 14.2 x 6.2 x 5.8 cm = volume: 269 mL. Contains a
5 cm cyst. No hydronephrosis.

Bladder:

Appears normal for degree of bladder distention.

Other:

None.
IMPRESSION: 1. Mild prominence of the right renal pelvis without caliectasis. No
gross hydronephrosis.
2. Bilateral renal cysts.

## 2020-07-04 ENCOUNTER — Other Ambulatory Visit: Payer: Self-pay | Admitting: Internal Medicine

## 2020-07-08 ENCOUNTER — Other Ambulatory Visit: Payer: Self-pay | Admitting: Internal Medicine

## 2021-03-02 ENCOUNTER — Emergency Department
Admission: EM | Admit: 2021-03-02 | Discharge: 2021-03-02 | Disposition: A | Discharge status: Home / Self Care | Payer: Medicare HMO | Attending: Emergency Medicine | Admitting: Emergency Medicine

## 2021-03-02 ENCOUNTER — Other Ambulatory Visit: Payer: Self-pay

## 2021-03-02 ENCOUNTER — Emergency Department: Payer: Medicare HMO

## 2021-03-02 DIAGNOSIS — Z951 Presence of aortocoronary bypass graft: Secondary | ICD-10-CM | POA: Diagnosis not present

## 2021-03-02 DIAGNOSIS — I10 Essential (primary) hypertension: Secondary | ICD-10-CM | POA: Diagnosis not present

## 2021-03-02 DIAGNOSIS — Z7984 Long term (current) use of oral hypoglycemic drugs: Secondary | ICD-10-CM | POA: Diagnosis not present

## 2021-03-02 DIAGNOSIS — Z79899 Other long term (current) drug therapy: Secondary | ICD-10-CM | POA: Diagnosis not present

## 2021-03-02 DIAGNOSIS — Z794 Long term (current) use of insulin: Secondary | ICD-10-CM | POA: Diagnosis not present

## 2021-03-02 DIAGNOSIS — I25118 Atherosclerotic heart disease of native coronary artery with other forms of angina pectoris: Secondary | ICD-10-CM | POA: Insufficient documentation

## 2021-03-02 DIAGNOSIS — Z20822 Contact with and (suspected) exposure to covid-19: Secondary | ICD-10-CM | POA: Insufficient documentation

## 2021-03-02 DIAGNOSIS — Z7982 Long term (current) use of aspirin: Secondary | ICD-10-CM | POA: Diagnosis not present

## 2021-03-02 DIAGNOSIS — I471 Supraventricular tachycardia: Secondary | ICD-10-CM

## 2021-03-02 DIAGNOSIS — R0602 Shortness of breath: Secondary | ICD-10-CM | POA: Diagnosis present

## 2021-03-02 DIAGNOSIS — Z87891 Personal history of nicotine dependence: Secondary | ICD-10-CM | POA: Insufficient documentation

## 2021-03-02 DIAGNOSIS — E119 Type 2 diabetes mellitus without complications: Secondary | ICD-10-CM | POA: Insufficient documentation

## 2021-03-02 DIAGNOSIS — I480 Paroxysmal atrial fibrillation: Secondary | ICD-10-CM | POA: Diagnosis not present

## 2021-03-02 LAB — CBC WITH DIFFERENTIAL/PLATELET
Abs Immature Granulocytes: 0.05 10*3/uL (ref 0.00–0.07)
Basophils Absolute: 0.1 10*3/uL (ref 0.0–0.1)
Basophils Relative: 1 %
Eosinophils Absolute: 0.2 10*3/uL (ref 0.0–0.5)
Eosinophils Relative: 6 %
HCT: 35.2 % — ABNORMAL LOW (ref 39.0–52.0)
Hemoglobin: 11.9 g/dL — ABNORMAL LOW (ref 13.0–17.0)
Immature Granulocytes: 1 %
Lymphocytes Relative: 38 %
Lymphs Abs: 1.6 10*3/uL (ref 0.7–4.0)
MCH: 37.8 pg — ABNORMAL HIGH (ref 26.0–34.0)
MCHC: 33.8 g/dL (ref 30.0–36.0)
MCV: 111.7 fL — ABNORMAL HIGH (ref 80.0–100.0)
Monocytes Absolute: 0.4 10*3/uL (ref 0.1–1.0)
Monocytes Relative: 10 %
Neutro Abs: 1.9 10*3/uL (ref 1.7–7.7)
Neutrophils Relative %: 44 %
Platelets: 126 10*3/uL — ABNORMAL LOW (ref 150–400)
RBC: 3.15 MIL/uL — ABNORMAL LOW (ref 4.22–5.81)
RDW: 15.7 % — ABNORMAL HIGH (ref 11.5–15.5)
Smear Review: NORMAL
WBC: 4.2 10*3/uL (ref 4.0–10.5)
nRBC: 0 % (ref 0.0–0.2)

## 2021-03-02 LAB — MAGNESIUM: Magnesium: 1.8 mg/dL (ref 1.7–2.4)

## 2021-03-02 LAB — COMPREHENSIVE METABOLIC PANEL
ALT: 54 U/L — ABNORMAL HIGH (ref 0–44)
AST: 41 U/L (ref 15–41)
Albumin: 3.7 g/dL (ref 3.5–5.0)
Alkaline Phosphatase: 79 U/L (ref 38–126)
Anion gap: 9 (ref 5–15)
BUN: 16 mg/dL (ref 8–23)
CO2: 25 mmol/L (ref 22–32)
Calcium: 8.7 mg/dL — ABNORMAL LOW (ref 8.9–10.3)
Chloride: 101 mmol/L (ref 98–111)
Creatinine, Ser: 0.97 mg/dL (ref 0.61–1.24)
GFR, Estimated: >60 mL/min (ref >60)
Glucose, Bld: 346 mg/dL — ABNORMAL HIGH (ref 70–99)
Potassium: 4 mmol/L (ref 3.5–5.1)
Sodium: 135 mmol/L (ref 135–145)
Total Bilirubin: 0.8 mg/dL (ref 0.3–1.2)
Total Protein: 7 g/dL (ref 6.5–8.1)

## 2021-03-02 LAB — RESP PANEL BY RT-PCR (FLU A&B, COVID) ARPGX2
Influenza A by PCR: NEGATIVE
Influenza B by PCR: NEGATIVE
SARS Coronavirus 2 by RT PCR: NEGATIVE

## 2021-03-02 LAB — BRAIN NATRIURETIC PEPTIDE: B Natriuretic Peptide: 40.4 pg/mL (ref 0.0–100.0)

## 2021-03-02 LAB — TROPONIN I (HIGH SENSITIVITY)
Troponin I (High Sensitivity): 10 ng/L (ref <18)
Troponin I (High Sensitivity): 18 ng/L — ABNORMAL HIGH (ref <18)

## 2021-03-02 MED ORDER — METOPROLOL SUCCINATE ER 25 MG PO TB24: 50.0000 mg | ORAL_TABLET | Freq: Every day | ORAL | 5 refills | Status: DC

## 2021-03-02 MED ORDER — METOPROLOL SUCCINATE ER 50 MG PO TB24
25.0000 mg | ORAL_TABLET | Freq: Every day | ORAL | Status: DC
  Administered 2021-03-02: 25 mg via ORAL
  Filled 2021-03-02: qty 1

## 2021-03-02 NOTE — ED Provider Notes (Signed)
HPI: Pt is a 75 y.o. male who presents with complaints of SOB/CP.  Pt in SVT got 231m fluids and '6mg'$  adenosine and converted.  1 nitro/324 aspirin. History of afib went down on metoprolol 1 month ago.  Pain started 950PM.   According to pt he is not doing the below. We recommend doubling the dose of your diltiazem to see if it reduces some of your chest pains. You can take 2 tablets until your current prescription runs out (for the '120mg'$  dose). I sent a prescription for '240mg'$  tablets that you can start once the '120mg'$  runs out.    ROS: Denies fever, chest pain, vomiting  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   Coronary artery disease    Diabetes (HCC)    H/O aortic valve replacement    History of kidney stones    Hx of CABG    Hyperlipidemia    Hypertension    Sleep apnea    There were no vitals filed for this visit.  Focused Physical Exam: Gen: No acute distress Head: atraumatic, normocephalic Eyes: Extraocular movements grossly intact; conjunctiva clear CV: RRR Lung: No increased WOB, no stridor GI: ND, no obvious masses Neuro: Alert and awake  Medical Decision Making and Plan: Given the patient's initial medical screening exam, the following diagnostic evaluation has been ordered. The patient will be placed in the appropriate treatment space, once one is available, to complete the evaluation and treatment. I have discussed the plan of care with the patient and I have advised the patient that an ED physician or mid-level practitioner will reevaluate their condition after the test results have been received, as the results may give them additional insight into the type of treatment they may need.   Diagnostics: labs, xray, covid swab  Needs room ASAP for cardiac monitoring.   Treatments: none immediately   FVanessa Atlanta MD 03/02/21 1125

## 2021-03-02 NOTE — ED Triage Notes (Signed)
Pt with c/o chest pain this AM. EMS brought pt to ER, given adenosine en route. Pt A& O x 4

## 2021-03-02 NOTE — Discharge Instructions (Addendum)
Your heart rate was elevated most likely secondary to you stopping your diltiazem.  You should start taking 2 of your metoprolol daily until you get follow-up with your cardiologist.  Please call them on Monday to schedule appointment.  If you have recurrent symptoms you need to return to the ER immediately to be further evaluated.  You will see them in your MyChart that your cardiac marker was slightly elevated but this is most likely just demand from how fast your heart was going I do not think it represents a heart attack.  However it is very important that you follow closely with cardiology and return if you develop chest pain

## 2021-03-02 NOTE — ED Provider Notes (Signed)
[facility] Emergency Department Provider Note  ____________________________________________   Event Date/Time   First MD Initiated Contact with Patient 03/02/21 1134     (approximate)  I have reviewed the triage vital signs and the nursing notes.   HISTORY  Chief Complaint Chest Pain    HPI Jonathan Erhard. is a 75 y.o. male with diabetes, aortic valve replacement, afib on eliquis who comes in with chest pain and shortness of breath. Pt in SVT got 294m fluids and '6mg'$  adenosine and converted.  1 nitro/324 aspirin.  Patient reports that his pain came on around 9:50 AM, constant, nothing made it better or worse.  He states that after the medications his pain is now much better.  He does have a history of A. fib.  He states that he took his metoprolol this morning.  He takes 25 mg.  He states he used to take up to 100 mg but stopped due to erectile dysfunction.  He states that even with the 25 mg though he has some mild erectile dysfunction and he would be okay going back up on metoprolol.  He states that he is stopped taking diltiazem 3 to 4 weeks ago.  He had seen his cardiologist back in July who recommended going off of the diltiazem but he stopped it without telling them.  He states that he stopped taking it due to acid reflux symptoms.  He denies any abdominal pain at this time.  He states that he would not be willing to restart this.  I did call his daughter to confirm his dosing of these medications and he did take the metoprolol this morning.      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   Nonrheumatic aortic valve stenosis 05/13/2019   Hyperlipidemia LDL goal <70 05/13/2019   Preop cardiovascular exam 05/13/2019   Bacteremia due to Enterococcus 04/18/2019   Class 2 severe obesity due to excess calories with serious comorbidity and body mass index (BMI) of 38.0 to 38.9 in adult ([facility] 03/24/2018   History of aortic valve replacement with bioprosthetic valve 08/07/2016   Status post coronary artery bypass graft 08/07/2016   Coronary artery disease of native artery of native heart with stable angina pectoris ([place] 02/11/2016   SVT (supra[place]ricular tachycardia) ([place] 07/31/2015   Type 2 diabetes mellitus without complication, with long-term current use of insulin ([place] 03/28/2015   Benign prostatic hyperplasia 04/07/2014   OSA (obstructive sleep apnea) 04/07/2014   Essential hypertension 05/16/2009    Past Surgical History:  Procedure Laterality Date   AORTIC VALVE REPLACEMENT  2017   [facility], bioprosthetic   APPENDECTOMY     CARDIAC VALVE REPLACEMENT     CORONARY ARTERY BYPASS GRAFT  2017   [facility] - [facility] and SVG-OM   CYSTOSCOPY W/ RETROGRADES Right 05/27/2019   Procedure:  CYSTOSCOPY WITH RETROGRADE PYELOGRAM;  Surgeon: Billey Co, MD;  Location: [facility] ORS;  Service: Urology;  Laterality: Right;   CYSTOSCOPY W/ RETROGRADES Right 07/11/2019   Procedure: CYSTOSCOPY WITH RETROGRADE PYELOGRAM;  Surgeon: Billey Co, MD;  Location: [facility] OR[facility];  Service: Urology;  Laterality: Right;   CYSTOSCOPY/URETEROSCOPY/HOLMIUM LASER/STENT PLACEMENT Right 05/27/2019   Procedure: CYSTOSCOPY/URETEROSCOPY/HOLMIUM LASER/STENT PLACEMENT;  Surgeon: Billey Co, MD;  Location: [facility] OR[facility];  Service: Urology;  Laterality: Right;   CYSTOSCOPY/URETEROSCOPY/HOLMIUM LASER/STENT PLACEMENT Right 06/17/2019    Procedure: CYSTOSCOPY/URETEROSCOPY/HOLMIUM LASER/STENT Exchange;  Surgeon: Billey Co, MD;  Location: [facility];  Service: Urology;  Laterality: Right;   CYSTOSCOPY/URETEROSCOPY/HOLMIUM LASER/STENT PLACEMENT Right 07/11/2019   Procedure: CYSTOSCOPY/URETEROSCOPY/HOLMIUM LASER/STENT Exchange;  Surgeon: Billey Co, MD;  Location: [facility];  Service: Urology;  Laterality: Right;   STONE EXTRACTION WITH BASKET Right 07/11/2019   Procedure: STONE EXTRACTION WITH BASKET;  Surgeon: Billey Co, MD;  Location: [facility];  Service: Urology;  Laterality: Right;   TEE WITHOUT CARDIOVERSION N/A 04/21/2019   Procedure: TRANSESOPHAGEAL ECHOCARDIOGRAM (TEE);  Surgeon: Minna Merritts, MD;  Location: [facility];  Service: Cardiovascular;  Laterality: N/A;   TEE WITHOUT CARDIOVERSION N/A 07/04/2019   Procedure: TRANSESOPHAGEAL ECHOCARDIOGRAM (TEE);  Surgeon: Wellington Hampshire, MD;  Location: [facility];  Service: Cardiovascular;  Laterality: N/A;   TEE WITHOUT CARDIOVERSION N/A 08/17/2019   Procedure: TRANSESOPHAGEAL ECHOCARDIOGRAM (TEE);  Surgeon: Kate Sable, MD;  Location: [facility];  Service: Cardiovascular;  Laterality: N/A;   TEE WITHOUT CARDIOVERSION N/A 11/15/2019   Procedure: TRANSESOPHAGEAL ECHOCARDIOGRAM (TEE);  Surgeon: Nelva Bush, MD;  Location: [facility];  Service: Cardiovascular;  Laterality: N/A;   TONSILLECTOMY      Prior to Admission medications   Medication Sig Start Date End Date Taking? Authorizing Provider  amLODipine (NORVASC) 5 MG tablet Take 5 mg by mouth daily. 03/08/19   [provider]  amoxicillin (AMOXIL) 500 MG capsule Take 1 capsule (500 mg total) by mouth in the morning and at bedtime. 11/08/19   Tsosie Billing, MD  Ascorbic Acid (VITAMIN C WITH ROSE HIPS) 500 MG tablet Take 1,000 mg by mouth daily.     [provider]  aspirin EC 81 MG tablet Take 81 mg by mouth daily.  05/30/14   [provider]  atorvastatin (LIPITOR) 80  MG tablet Take 80 mg by mouth every evening.  08/13/18   [provider]  AZO-CRANBERRY PO Take 1 tablet by mouth daily.     [provider]  glipiZIDE (GLUCOTROL XL) 2.5 MG 24 hr tablet Take 2.5 mg by mouth daily with breakfast.  05/05/19   [provider]  insulin aspart (NOVOLOG) 100 UNIT/ML injection Inject 0-5 Units into the skin at bedtime. Patient not taking: Reported on 11/15/2019 04/23/19   Epifanio Lesches, MD  isosorbide mononitrate (IMDUR) 60 MG 24 hr tablet Take 1 tablet (60 mg total) by mouth daily. 05/12/19 11/15/19  End, Harrell Gave, MD  lisinopril (ZESTRIL) 20 MG tablet Take 1 tablet by mouth once daily 11/28/19   End, Harrell Gave, MD  loratadine (CLARITIN) 10 MG tablet Take 10 mg by mouth daily. 09/13/18 11/15/19  [provider]  metoprolol succinate (TOPROL-XL) 25 MG 24 hr tablet Take 1 tablet (25 mg total) by mouth 2 (two) times daily. 11/18/19   Almyra Deforest, PA  nitroGLYCERIN (NITROSTAT) 0.4 MG SL tablet Place 0.4 mg under the tongue every 5 (five) minutes x 3 doses as needed for chest pain.  03/24/18 11/15/19  [provider]  NOVOLIN 70/30 RELION (70-30) 100 UNIT/ML injection Inject 100 Units into the skin 2 (two) times daily.  05/13/19   [provider]  omeprazole (PRILOSEC) 20 MG capsule Take 20 mg by mouth every evening.  04/22/18 11/15/19  [provider]  ondansetron (ZOFRAN-ODT) 4 MG disintegrating tablet Take 4 mg by mouth every 8 (eight) hours as needed for nausea or vomiting.    [provider]  oxyCODONE-acetaminophen (PERCOCET) 5-325 MG tablet Take 1 tablet by mouth every 8 (eight) hours as needed. Patient taking differently: Take 1 tablet by mouth every 8 (eight) hours as needed (pain.).  10/19/19   Earleen Newport, MD  Potassium Citrate 15 MEQ (1620 MG) TBCR Take 2 tablets by mouth 2 (two) times daily. Patient taking differently: Take 1 tablet by mouth 2 (two) times daily.  07/20/19   Billey Co, MD   terbinafine (LAMISIL AT) 1 % cream Apply 1 application topically 2 (two) times daily. 11/08/19   Tsosie Billing, MD  terbinafine (LAMISIL) 1 % cream Apply 1 application topically 2 (two) times daily as needed (ATHLETE'S FOOT).    [provider]    Allergies Metformin  Family History  Problem Relation Age of Onset   Heart attack Father 41    Social History Social History   Tobacco Use   Smoking status: Former   Smokeless tobacco: Never  Scientific laboratory technician Use: Never used  Substance Use Topics   Alcohol use: Never   Drug use: Never      Review of Systems Constitutional: No fever/chills Eyes: No visual changes. ENT: No sore throat. Cardiovascular: Positive chest pain Respiratory: Positive shortness of breath Gastrointestinal: No abdominal pain.  No nausea, no vomiting.  No diarrhea.  No constipation. Genitourinary: Negative for dysuria. Musculoskeletal: Negative for back pain. Skin: Negative for rash. Neurological: Negative for headaches, focal weakness or numbness. All other ROS negative ____________________________________________   PHYSICAL EXAM:  VITAL SIGNS: ED Triage Vitals  Enc Vitals Group     BP 03/02/21 1131 119/69     Pulse Rate 03/02/21 1131 95     Resp 03/02/21 1131 20     Temp 03/02/21 1132 97.9 F (36.6 C)     Temp Source 03/02/21 1132 Oral     SpO2 03/02/21 1131 95 %     Weight 03/02/21 1126 250 lb (113.4 kg)     Height 03/02/21 1126 '5\' 9"'$  (1.753 m)     Head Circumference --      Peak Flow --      Pain Score 03/02/21 1125 0     Pain Loc --      Pain Edu? --      Excl. in [place]? --     Constitutional: Alert and oriented. Well appearing and in no acute distress. Eyes: Conjunctivae are normal. EOMI. Head: Atraumatic. Nose: No congestion/rhinnorhea. Mouth/Throat: Mucous membranes are moist.   Neck: No stridor. Trachea Midline. FROM Cardiovascular: Normal rate, regular rhythm. Grossly normal heart sounds.  Good peripheral  circulation. Respiratory: Normal respiratory effort.  No retractions. Lungs CTAB. Gastrointestinal: Soft and nontender. No distention. No abdominal bruits.  Musculoskeletal: No lower extremity tenderness nor edema.  No joint effusions. Neurologic:  Normal speech and language. No gross focal neurologic deficits are appreciated.  Skin:  Skin is warm, dry and intact. No rash noted. Psychiatric: Mood and affect are normal. Speech and behavior are normal. GU: Deferred   ____________________________________________   LABS (all labs ordered are listed,  but only abnormal results are displayed)  Labs Reviewed  CBC WITH DIFFERENTIAL/PLATELET - Abnormal; Notable for the following components:      Result Value   RBC 3.15 (*)    Hemoglobin 11.9 (*)    HCT 35.2 (*)    MCV 111.7 (*)    MCH 37.8 (*)    RDW 15.7 (*)    Platelets 126 (*)    All other components within normal limits  RESP PANEL BY RT-PCR (FLU A&B, COVID) ARPGX2  COMPREHENSIVE METABOLIC PANEL  BRAIN NATRIURETIC PEPTIDE  MAGNESIUM  TROPONIN I (HIGH SENSITIVITY)   ____________________________________________   ED ECG REPORT I, Vanessa Shellman, the attending physician, personally viewed and interpreted this ECG.  Normal sinus rate of 93, no st elevation, twi in avl, normal intervals ____________________________________________  RADIOLOGY Robert Bellow, personally viewed and evaluated these images (plain radiographs) as part of my medical decision making, as well as reviewing the written report by the radiologist.  ED MD interpretation: No pneumonia  Official radiology report(s): DG Chest Portable 1 View  Result Date: 03/02/2021 CLINICAL DATA:  75 y.o. male who presents with complaints of SOB/CP. Pt in SVT got 230m fluids and '6mg'$  adenosine and converted. 1 nitro/324 aspirin. History of afib went down on metoprolol 1 month ago. Pain started 950PM EXAM: PORTABLE CHEST 1 VIEW COMPARISON:  08/13/2019 FINDINGS: Stable changes from  prior cardiac surgery and valve replacement. Cardiac silhouette is normal in size and configuration. No mediastinal or hilar masses. Reticular opacities in the lateral lung bases are stable consistent with chronic scarring. Lungs are otherwise clear. No pleural effusion or pneumothorax. Skeletal structures are grossly intact. IMPRESSION: No active disease. Electronically Signed   By: DLajean ManesM.D.   On: 03/02/2021 11:52    ____________________________________________   PROCEDURES  Procedure(s) performed (including Critical Care):  .1-3 Lead EKG Interpretation  Date/Time: 03/02/2021 12:06 PM Performed by: FVanessa [place] MD Authorized by: FVanessa [place] MD     Interpretation: abnormal     ECG rate:  90s   ECG rate assessment: normal     Rhythm: sinus rhythm     Ectopy: none     Conduction: normal     ____________________________________________   INITIAL IMPRESSION / ASSESSMENT AND PLAN / ED COURSE   Jonathan Cross was evaluated in Emergency Department on 03/02/2021 for the symptoms described in the history of present illness. He was evaluated in the context of the global COVID-19 pandemic, which necessitated consideration that the patient might be at risk for infection with the SARS-CoV-2 virus that causes COVID-19. Institutional protocols and algorithms that pertain to the evaluation of patients at risk for COVID-19 are in a state of rapid change based on information released by regulatory bodies including the CDC and federal and state organizations. These policies and algorithms were followed during the patient's care in the ED.    Most Likely DDx:  -Symptoms are most likely secondary to uncontrolled A. fib with RVR versus SVT.  Patient is converted back into normal sinus with adenosine.  We will keep patient the cardiac monitor.   DDx that was also considered d/t potential to cause harm, but was found less likely based on history and physical (as detailed above): -PNA  (no fevers, cough but CXR to evaluate) -PNX (reassured with equal b/l breath sounds, CXR to evaluate) -Symptomatic anemia (will get H&H) -Pulmonary embolism as no sob at rest, not pleuritic in nature, no hypoxia on eliquis  -Aortic  Dissection as no tearing pain and no radiation to the mid back, pulses equal -Pericarditis no rub on exam, EKG changes or hx to suggest dx -Tamponade (no notable SOB, tachycardic, hypotensive) -Esophageal rupture (no h/o diffuse vomitting/no crepitus)  Discussed with patient's daughter and with patient and he is unwilling to restart the diltiazem but is willing to go up on the metoprolol.  We will give an additional dose of metoprolol and to go up to a total of 50 mg daily.  We will have patient follow-up with her cardiology outpatient.  2:50 PM repeat evaluation patient is continuing to feel much better.  Denies any symptoms.  Vital signs are stable remains in normal sinus.  Troponin went from 10-18 most likely demand in the setting of fast heart rates.  Do not think this represents ACS given symptoms have completely resolved however I did explain to patient that he is to follow-up with his cardiologist given this.  Patient's sugar was elevated at 346 but is not taking his insulin yet today and he has no evidence of DKA.  Started him to take this medication when he gets home.  At this time he feels comfortable going home and will go up on his metoprolol to 50 mg.  We will hold off on going up to 100 like he has been on previously due to some issues with hypotension when he was on that.  He will need to closely follow-up with his cardiologist to decide if they need to adjust this further.  He expressed understanding and felt comfortable with discharge home  I discussed the provisional nature of ED diagnosis, the treatment so far, the ongoing plan of care, follow up appointments and return precautions with the patient and any family or support people present. They expressed  understanding and agreed with the plan, discharged home.         ____________________________________________   FINAL CLINICAL IMPRESSION(S) / ED DIAGNOSES   Final diagnoses:  SVT (supraventricular tachycardia) (HCC)  Paroxysmal atrial fibrillation ([place])     MEDICATIONS GIVEN DURING THIS VISIT:  Medications  metoprolol succinate (TOPROL-XL) 24 hr tablet 25 mg (25 mg Oral Given 03/02/21 1208)     ED Discharge Orders     None        Note:  This document was prepared using Dragon voice recognition software and may include unintentional dictation errors.    Vanessa [place], MD 03/02/21 [phone]

## 2021-03-02 NOTE — ED Triage Notes (Signed)
First nurse note: pt comes from home with SOB/CP. Pt was in SVT and was given 210m fluids (still going) and was given '6mg'$  adenosine and converted to NSR. Pt was given 1 nitro and '324mg'$  aspirin prior to EMS arrival. 124/72. 20G R AC.

## 2021-04-19 ENCOUNTER — Emergency Department
Admission: EM | Admit: 2021-04-19 | Discharge: 2021-04-19 | Disposition: A | Discharge status: Home / Self Care | Payer: Medicare HMO | Attending: Emergency Medicine | Admitting: Emergency Medicine

## 2021-04-19 ENCOUNTER — Emergency Department: Payer: Medicare HMO

## 2021-04-19 DIAGNOSIS — Z951 Presence of aortocoronary bypass graft: Secondary | ICD-10-CM | POA: Diagnosis not present

## 2021-04-19 DIAGNOSIS — I25118 Atherosclerotic heart disease of native coronary artery with other forms of angina pectoris: Secondary | ICD-10-CM | POA: Insufficient documentation

## 2021-04-19 DIAGNOSIS — Z7982 Long term (current) use of aspirin: Secondary | ICD-10-CM | POA: Diagnosis not present

## 2021-04-19 DIAGNOSIS — Z7901 Long term (current) use of anticoagulants: Secondary | ICD-10-CM | POA: Insufficient documentation

## 2021-04-19 DIAGNOSIS — I10 Essential (primary) hypertension: Secondary | ICD-10-CM | POA: Diagnosis not present

## 2021-04-19 DIAGNOSIS — Z7984 Long term (current) use of oral hypoglycemic drugs: Secondary | ICD-10-CM | POA: Insufficient documentation

## 2021-04-19 DIAGNOSIS — E119 Type 2 diabetes mellitus without complications: Secondary | ICD-10-CM | POA: Insufficient documentation

## 2021-04-19 DIAGNOSIS — I471 Supraventricular tachycardia: Secondary | ICD-10-CM | POA: Diagnosis not present

## 2021-04-19 DIAGNOSIS — Z794 Long term (current) use of insulin: Secondary | ICD-10-CM | POA: Insufficient documentation

## 2021-04-19 DIAGNOSIS — Z87891 Personal history of nicotine dependence: Secondary | ICD-10-CM | POA: Diagnosis not present

## 2021-04-19 DIAGNOSIS — R002 Palpitations: Secondary | ICD-10-CM | POA: Diagnosis present

## 2021-04-19 DIAGNOSIS — Z79899 Other long term (current) drug therapy: Secondary | ICD-10-CM | POA: Insufficient documentation

## 2021-04-19 LAB — COMPREHENSIVE METABOLIC PANEL
ALT: 58 U/L — ABNORMAL HIGH (ref 0–44)
AST: 37 U/L (ref 15–41)
Albumin: 3.8 g/dL (ref 3.5–5.0)
Alkaline Phosphatase: 58 U/L (ref 38–126)
Anion gap: 9 (ref 5–15)
BUN: 18 mg/dL (ref 8–23)
CO2: 25 mmol/L (ref 22–32)
Calcium: 8.4 mg/dL — ABNORMAL LOW (ref 8.9–10.3)
Chloride: 100 mmol/L (ref 98–111)
Creatinine, Ser: 0.97 mg/dL (ref 0.61–1.24)
GFR, Estimated: >60 mL/min (ref >60)
Glucose, Bld: 303 mg/dL — ABNORMAL HIGH (ref 70–99)
Potassium: 4.1 mmol/L (ref 3.5–5.1)
Sodium: 134 mmol/L — ABNORMAL LOW (ref 135–145)
Total Bilirubin: 0.9 mg/dL (ref 0.3–1.2)
Total Protein: 7.1 g/dL (ref 6.5–8.1)

## 2021-04-19 LAB — CBC
HCT: 35.5 % — ABNORMAL LOW (ref 39.0–52.0)
Hemoglobin: 12.4 g/dL — ABNORMAL LOW (ref 13.0–17.0)
MCH: 38.2 pg — ABNORMAL HIGH (ref 26.0–34.0)
MCHC: 34.9 g/dL (ref 30.0–36.0)
MCV: 109.2 fL — ABNORMAL HIGH (ref 80.0–100.0)
Platelets: 122 10*3/uL — ABNORMAL LOW (ref 150–400)
RBC: 3.25 MIL/uL — ABNORMAL LOW (ref 4.22–5.81)
RDW: 14.4 % (ref 11.5–15.5)
WBC: 4.4 10*3/uL (ref 4.0–10.5)
nRBC: 0 % (ref 0.0–0.2)

## 2021-04-19 LAB — TROPONIN I (HIGH SENSITIVITY): Troponin I (High Sensitivity): 19 ng/L — ABNORMAL HIGH (ref <18)

## 2021-04-19 LAB — PROTIME-INR
INR: 1.2 (ref 0.8–1.2)
Prothrombin Time: 15.2 seconds (ref 11.4–15.2)

## 2021-04-19 LAB — APTT: aPTT: 31 seconds (ref 24–36)

## 2021-04-19 MED ORDER — DILTIAZEM HCL 25 MG/5ML IV SOLN
20.0000 mg | Freq: Once | INTRAVENOUS | Status: AC
  Administered 2021-04-19: 20 mg via INTRAVENOUS

## 2021-04-19 MED ORDER — DILTIAZEM HCL 25 MG/5ML IV SOLN
INTRAVENOUS | Status: AC
  Filled 2021-04-19: qty 5

## 2021-04-19 NOTE — ED Notes (Signed)
DC ppw provided./ pt denies questions at this time. PT assisted off unit via wheelchair released to care or family

## 2021-04-19 NOTE — ED Notes (Signed)
PT daughter called to see about fathers progress. Pt daughter was informed that pt is still up for dc and is awaiting a ride from family Daughter to update [place] on a ride home.

## 2021-04-19 NOTE — ED Provider Notes (Signed)
[facility] Department Provider Note   ____________________________________________    I have reviewed the triage vital signs and the nursing notes.   HISTORY  Chief Complaint Chest Pain and Tachycardia     HPI Jonathan Cross. is a 75 y.o. male with a history of CAD, diabetes, aortic valve replacement who presents with palpitations, chest discomfort.  Patient reports a history of SVT and atrial fibrillation, reports compliance with medications.  Reports this feels like SVT.  EMS gave 6 mg of adenosine followed by 12 mg of adenosine, they did have a brief recovery to normal sinus rhythm but patient then went back into SVT again.  No fevers chills cough or shortness of breath.  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   Coronary artery disease    Diabetes ([place])    H/O aortic valve replacement    History of kidney stones    Hx of CABG    Hyperlipidemia    Hypertension    Sleep apnea     Patient Active Problem List   Diagnosis Date Noted   Tinea corporis    Swelling of toe of left foot 11/02/2019   Aortic valve endocarditis    Coronary artery disease    Hypoalbuminemia 06/29/2019   Hyperglycemia 06/29/2019   Lactic acidosis 06/29/2019   GERD (gastroesophageal reflux disease) 06/29/2019   Staghorn renal calculus 06/29/2019   Nonrheumatic aortic valve stenosis 05/13/2019   Hyperlipidemia LDL goal <70 05/13/2019   Preop cardiovascular exam 05/13/2019   Bacteremia due to Enterococcus 04/18/2019   Class 2 severe obesity due to excess calories with serious comorbidity and body mass index (BMI) of 38.0 to 38.9 in adult [facility]) 03/24/2018   History of aortic valve replacement with bioprosthetic valve 08/07/2016   Status post coronary artery bypass graft 08/07/2016   Coronary artery disease of native artery of native heart with stable angina pectoris ([place]) 02/11/2016   SVT (supraventricular tachycardia)  ([place]) 07/31/2015   Type 2 diabetes mellitus without complication, with long-term current use of insulin ([place]) 03/28/2015   Benign prostatic hyperplasia 04/07/2014   OSA (obstructive sleep apnea) 04/07/2014   Essential hypertension 05/16/2009    Past Surgical History:  Procedure Laterality Date   AORTIC VALVE REPLACEMENT  2017   [facility], bioprosthetic   APPENDECTOMY     CARDIAC VALVE REPLACEMENT     CORONARY ARTERY BYPASS GRAFT  2017   [facility] - [facility] and SVG-OM   CYSTOSCOPY W/ RETROGRADES Right 05/27/2019   Procedure: CYSTOSCOPY WITH RETROGRADE PYELOGRAM;  Surgeon: Billey Co, MD;  Location: [facility] ORS;  Service: Urology;  Laterality: Right;   CYSTOSCOPY W/ RETROGRADES Right 07/11/2019   Procedure: CYSTOSCOPY WITH RETROGRADE PYELOGRAM;  Surgeon: Billey Co, MD;  Location: [facility] OR[facility];  Service: Urology;  Laterality: Right;   CYSTOSCOPY/URETEROSCOPY/HOLMIUM LASER/STENT PLACEMENT Right 05/27/2019   Procedure: CYSTOSCOPY/URETEROSCOPY/HOLMIUM LASER/STENT PLACEMENT;  Surgeon: Billey Co, MD;  Location: [facility] OR[facility];  Service: Urology;  Laterality: Right;   CYSTOSCOPY/URETEROSCOPY/HOLMIUM LASER/STENT PLACEMENT Right 06/17/2019   Procedure: CYSTOSCOPY/URETEROSCOPY/HOLMIUM LASER/STENT Exchange;  Surgeon: Billey Co, MD;  Location: [facility];  Service: Urology;  Laterality: Right;   CYSTOSCOPY/URETEROSCOPY/HOLMIUM LASER/STENT PLACEMENT Right 07/11/2019   Procedure: CYSTOSCOPY/URETEROSCOPY/HOLMIUM LASER/STENT Exchange;  Surgeon: Billey Co, MD;  Location: [facility];  Service: Urology;  Laterality: Right;   STONE EXTRACTION WITH BASKET Right 07/11/2019   Procedure: STONE EXTRACTION WITH BASKET;  Surgeon: Billey Co, MD;  Location: [facility];  Service:  Urology;  Laterality: Right;   TEE WITHOUT CARDIOVERSION N/A 04/21/2019   Procedure: TRANSESOPHAGEAL ECHOCARDIOGRAM (TEE);  Surgeon: Minna Merritts, MD;  Location: [facility];  Service: Cardiovascular;  Laterality: N/A;   TEE WITHOUT  CARDIOVERSION N/A 07/04/2019   Procedure: TRANSESOPHAGEAL ECHOCARDIOGRAM (TEE);  Surgeon: Wellington Hampshire, MD;  Location: [facility];  Service: Cardiovascular;  Laterality: N/A;   TEE WITHOUT CARDIOVERSION N/A 08/17/2019   Procedure: TRANSESOPHAGEAL ECHOCARDIOGRAM (TEE);  Surgeon: Kate Sable, MD;  Location: [facility];  Service: Cardiovascular;  Laterality: N/A;   TEE WITHOUT CARDIOVERSION N/A 11/15/2019   Procedure: TRANSESOPHAGEAL ECHOCARDIOGRAM (TEE);  Surgeon: Nelva Bush, MD;  Location: [facility];  Service: Cardiovascular;  Laterality: N/A;   TONSILLECTOMY      Prior to Admission medications   Medication Sig Start Date End Date Taking? Authorizing Provider  amLODipine (NORVASC) 5 MG tablet Take 5 mg by mouth daily. 03/08/19   [provider]  amoxicillin (AMOXIL) 500 MG capsule Take 1 capsule (500 mg total) by mouth in the morning and at bedtime. Patient not taking: Reported on 04/19/2021 11/08/19   Tsosie Billing, MD  Ascorbic Acid (VITAMIN C WITH ROSE HIPS) 500 MG tablet Take 1,000 mg by mouth daily.     [provider]  aspirin EC 81 MG tablet Take 81 mg by mouth daily.  05/30/14   [provider]  atorvastatin (LIPITOR) 80 MG tablet Take 80 mg by mouth every evening.  08/13/18   [provider]  AZO-CRANBERRY PO Take 1 tablet by mouth daily.     [provider]  calcium carbonate (OSCAL) 1500 (600 Ca) MG TABS tablet Take 1 tablet by mouth every morning.    [provider]  diltiazem (CARDIZEM CD) 240 MG 24 hr capsule Take 240 mg by mouth every morning. 02/01/21   [provider]  ELIQUIS 5 MG TABS tablet Take 5 mg by mouth 2 (two) times daily. 04/18/21   [provider]  glipiZIDE (GLUCOTROL XL) 2.5 MG 24 hr tablet Take 2.5 mg by mouth daily with breakfast.  05/05/19   [provider]  insulin aspart (NOVOLOG) 100 UNIT/ML injection Inject 0-5 Units into the skin at bedtime. Patient not taking:  Reported on 11/15/2019 04/23/19   Epifanio Lesches, MD  isosorbide mononitrate (IMDUR) 60 MG 24 hr tablet Take 1 tablet (60 mg total) by mouth daily. 05/12/19 11/15/19  End, Harrell Gave, MD  lisinopril (ZESTRIL) 20 MG tablet Take 1 tablet by mouth once daily 11/28/19   End, Harrell Gave, MD  loratadine (CLARITIN) 10 MG tablet Take 10 mg by mouth daily. 09/13/18 11/15/19  [provider]  metoprolol succinate (TOPROL-XL) 25 MG 24 hr tablet Take 2 tablets (50 mg total) by mouth daily. 03/02/21   Vanessa Sachse, MD  nitroGLYCERIN (NITROSTAT) 0.4 MG SL tablet Place 0.4 mg under the tongue every 5 (five) minutes x 3 doses as needed for chest pain.  03/24/18 11/15/19  [provider]  NOVOLIN 70/30 RELION (70-30) 100 UNIT/ML injection Inject 100 Units into the skin 2 (two) times daily.  05/13/19   [provider]  omeprazole (PRILOSEC) 20 MG capsule Take 20 mg by mouth every evening.  04/22/18 11/15/19  [provider]  ondansetron (ZOFRAN-ODT) 4 MG disintegrating tablet Take 4 mg by mouth every 8 (eight) hours as needed for nausea or vomiting.    [provider]  oxyCODONE-acetaminophen (PERCOCET) 5-325 MG tablet Take 1 tablet by mouth every 8 (eight) hours as needed. Patient taking differently:  Take 1 tablet by mouth every 8 (eight) hours as needed (pain.).  10/19/19   Earleen Newport, MD  Potassium Citrate 15 MEQ (1620 MG) TBCR Take 2 tablets by mouth 2 (two) times daily. Patient taking differently: Take 1 tablet by mouth 2 (two) times daily.  07/20/19   Billey Co, MD  tamsulosin (FLOMAX) 0.4 MG CAPS capsule Take 0.4 mg by mouth daily. 03/06/21   [provider]  terbinafine (LAMISIL AT) 1 % cream Apply 1 application topically 2 (two) times daily. 11/08/19   Tsosie Billing, MD     Allergies Metformin  Family History  Problem Relation Age of Onset   Heart attack Father 27    Social History Social History   Tobacco Use   Smoking status:  Former   Smokeless tobacco: Never  Scientific laboratory technician Use: Never used  Substance Use Topics   Alcohol use: Never   Drug use: Never    Review of Systems  Constitutional: No fever/chills Eyes: No visual changes.  ENT: No sore throat. Cardiovascular: As above Respiratory: Denies shortness of breath. Gastrointestinal: No abdominal pain.  No nausea, no vomiting.   Genitourinary: Negative for dysuria. Musculoskeletal: Negative for back pain. Skin: Negative for rash. Neurological: Negative for headaches or weakness   ____________________________________________   PHYSICAL EXAM:  VITAL SIGNS: ED Triage Vitals  Enc Vitals Group     BP 04/19/21 0905 (!) 133/106     Pulse Rate 04/19/21 0905 (!) 159     Resp 04/19/21 0901 (!) 24     Temp 04/19/21 0908 98.4 F (36.9 C)     Temp src --      SpO2 04/19/21 0905 100 %     Weight 04/19/21 0909 116.5 kg (256 lb 12.8 oz)     Height --      Head Circumference --      Peak Flow --      Pain Score 04/19/21 0907 3     Pain Loc --      Pain Edu? --      Excl. in [place]? --     Constitutional: Alert and oriented.  Eyes: Conjunctivae are normal.  Head: Atraumatic. Nose: No congestion/rhinnorhea. Mouth/Throat: Mucous membranes are moist.   Neck:  Painless ROM Cardiovascular: Tachycardia regular rhythm.  Patient with bioprosthetic aortic valve good peripheral circulation. Respiratory: Normal respiratory effort.  No retractions. Lungs CTAB. Gastrointestinal: Soft and nontender. No distention.    Musculoskeletal: No lower extremity tenderness nor edema.  Warm and well perfused Neurologic:  Normal speech and language. No gross focal neurologic deficits are appreciated.  Skin:  Skin is warm, dry and intact. No rash noted. Psychiatric: Mood and affect are normal. Speech and behavior are normal.  ____________________________________________   LABS (all labs ordered are listed, but only abnormal results are displayed)  Labs Reviewed   CBC - Abnormal; Notable for the following components:      Result Value   RBC 3.25 (*)    Hemoglobin 12.4 (*)    HCT 35.5 (*)    MCV 109.2 (*)    MCH 38.2 (*)    Platelets 122 (*)    All other components within normal limits  COMPREHENSIVE METABOLIC PANEL - Abnormal; Notable for the following components:   Sodium 134 (*)    Glucose, Bld 303 (*)    Calcium 8.4 (*)    ALT 58 (*)    All other components within normal limits  TROPONIN I (HIGH SENSITIVITY) -  Abnormal; Notable for the following components:   Troponin I (High Sensitivity) 19 (*)    All other components within normal limits  APTT  PROTIME-INR   ____________________________________________  EKG  ED ECG REPORT I, Lavonia Drafts, the attending physician, personally viewed and interpreted this ECG.  Date: 04/19/2021  Rate: 166 Rhythm: SVT QRS Axis: normal Intervals: Abnormal ST/T Wave abnormalities: normal Narrative Interpretation: Most consistent with SVT  ____________________________________________  RADIOLOGY  Chest x-ray viewed by me, no acute abnormality ____________________________________________   PROCEDURES  Procedure(s) performed: No  .1-3 Lead EKG Interpretation Performed by: Lavonia Drafts, MD Authorized by: Lavonia Drafts, MD     Interpretation: abnormal     ECG rate assessment: tachycardic     Rhythm: SVT     Ectopy: none     Critical Care performed: yes  CRITICAL CARE Performed by: Lavonia Drafts   Total critical care time: 30 minutes  Critical care time was exclusive of separately billable procedures and treating other patients.  Critical care was necessary to treat or prevent imminent or life-threatening deterioration.  Critical care was time spent personally by me on the following activities: development of treatment plan with patient and/or surrogate as well as nursing, discussions with consultants, evaluation of patient's response to treatment, examination of patient,  obtaining history from patient or surrogate, ordering and performing treatments and interventions, ordering and review of laboratory studies, ordering and review of radiographic studies, pulse oximetry and re-evaluation of patient's condition.  ____________________________________________   INITIAL IMPRESSION / ASSESSMENT AND PLAN / ED COURSE  Pertinent labs & imaging results that were available during my care of the patient were reviewed by me and considered in my medical decision making (see chart for details).   Patient presents with chest discomfort as detailed above, found to be in SVT by EMS.  They attempted 6 mg of adenosine with brief conversion but return of SVT, they then did 12 mg of adenosine.  On arrival patient's remains tachycardic EKG is most consistent with SVT, noted patient's history of atrial fibrillation  Will give IV Cardizem 20 mg slow push while we obtain labs   ----------------------------------------- 9:38 AM on 04/19/2021 ----------------------------------------- Patient has converted to normal sinus rhythm after Cardizem  ----------------------------------------- 10:38 AM on 04/19/2021 ----------------------------------------- Patient remains in sinus rhythm, lab work is overall unremarkable.  Patient's troponin is in line with level from last month  He is anxious to go home and I think that is appropriate given resolution of SVT, have asked him to follow-up closely with his cardiologist, return promptly to the ED if symptoms return    ____________________________________________   FINAL CLINICAL IMPRESSION(S) / ED DIAGNOSES  Final diagnoses:  SVT (supraventricular tachycardia) ([place])        Note:  This document was prepared using Dragon voice recognition software and may include unintentional dictation errors.    Lavonia Drafts, MD 04/19/21 1039

## 2021-04-19 NOTE — ED Notes (Signed)
Ok to dc pt now, no need to wait for 2nd Trop per Dr. Corky Downs

## 2021-04-19 NOTE — ED Triage Notes (Signed)
Pt arrives via ems from home. Pt reports he was drinking coffee this morning and felt his heart race and then began having some chest discomfort. Upon ems arrival pt was in SVT. EMS administered 6mg  and then 12mg  of adenosine.

## 2021-04-19 NOTE — ED Notes (Signed)
Pt ready for DC but does not have a ride until 5pm. Pt moved to hallways bed and is resting with no needs verbalized at this time

## 2021-04-19 NOTE — ED Notes (Signed)
PT endorsing "something doesn't feel right."Pt placed back on monitor and was in rapid afib 140's. Pt then converted back into afib in 80's. Md made aware, pt to be monitored for a while longer prior to DC. PT daughter concerned with patient going home

## 2021-04-19 NOTE — ED Notes (Signed)
PT assisted to BR via wheelchair. PT assisted back to bed

## 2021-06-02 ENCOUNTER — Emergency Department: Payer: Medicare HMO

## 2021-06-02 ENCOUNTER — Observation Stay
Admission: EM | Admit: 2021-06-02 | Discharge: 2021-06-04 | Disposition: A | Discharge status: Home / Self Care | Payer: Medicare HMO | Attending: Internal Medicine | Admitting: Internal Medicine

## 2021-06-02 ENCOUNTER — Other Ambulatory Visit: Payer: Self-pay

## 2021-06-02 ENCOUNTER — Encounter: Payer: Self-pay | Admitting: Emergency Medicine

## 2021-06-02 DIAGNOSIS — Z87891 Personal history of nicotine dependence: Secondary | ICD-10-CM | POA: Insufficient documentation

## 2021-06-02 DIAGNOSIS — Z951 Presence of aortocoronary bypass graft: Secondary | ICD-10-CM | POA: Insufficient documentation

## 2021-06-02 DIAGNOSIS — Z79899 Other long term (current) drug therapy: Secondary | ICD-10-CM | POA: Insufficient documentation

## 2021-06-02 DIAGNOSIS — E119 Type 2 diabetes mellitus without complications: Secondary | ICD-10-CM

## 2021-06-02 DIAGNOSIS — E785 Hyperlipidemia, unspecified: Secondary | ICD-10-CM | POA: Diagnosis present

## 2021-06-02 DIAGNOSIS — Z7901 Long term (current) use of anticoagulants: Secondary | ICD-10-CM | POA: Insufficient documentation

## 2021-06-02 DIAGNOSIS — I2510 Atherosclerotic heart disease of native coronary artery without angina pectoris: Secondary | ICD-10-CM | POA: Diagnosis present

## 2021-06-02 DIAGNOSIS — R0602 Shortness of breath: Secondary | ICD-10-CM | POA: Diagnosis not present

## 2021-06-02 DIAGNOSIS — I10 Essential (primary) hypertension: Secondary | ICD-10-CM | POA: Diagnosis present

## 2021-06-02 DIAGNOSIS — Z7982 Long term (current) use of aspirin: Secondary | ICD-10-CM | POA: Diagnosis not present

## 2021-06-02 DIAGNOSIS — L03811 Cellulitis of head [any part, except face]: Secondary | ICD-10-CM | POA: Diagnosis not present

## 2021-06-02 DIAGNOSIS — R778 Other specified abnormalities of plasma proteins: Secondary | ICD-10-CM | POA: Diagnosis not present

## 2021-06-02 DIAGNOSIS — A419 Sepsis, unspecified organism: Secondary | ICD-10-CM

## 2021-06-02 DIAGNOSIS — I4891 Unspecified atrial fibrillation: Secondary | ICD-10-CM | POA: Diagnosis present

## 2021-06-02 DIAGNOSIS — L089 Local infection of the skin and subcutaneous tissue, unspecified: Secondary | ICD-10-CM | POA: Insufficient documentation

## 2021-06-02 DIAGNOSIS — Z20822 Contact with and (suspected) exposure to covid-19: Secondary | ICD-10-CM | POA: Insufficient documentation

## 2021-06-02 DIAGNOSIS — R7881 Bacteremia: Secondary | ICD-10-CM | POA: Diagnosis not present

## 2021-06-02 DIAGNOSIS — Z794 Long term (current) use of insulin: Secondary | ICD-10-CM

## 2021-06-02 DIAGNOSIS — I25118 Atherosclerotic heart disease of native coronary artery with other forms of angina pectoris: Secondary | ICD-10-CM | POA: Diagnosis not present

## 2021-06-02 DIAGNOSIS — N400 Enlarged prostate without lower urinary tract symptoms: Secondary | ICD-10-CM | POA: Diagnosis present

## 2021-06-02 DIAGNOSIS — D61818 Other pancytopenia: Secondary | ICD-10-CM | POA: Diagnosis present

## 2021-06-02 DIAGNOSIS — R7989 Other specified abnormal findings of blood chemistry: Secondary | ICD-10-CM | POA: Diagnosis present

## 2021-06-02 DIAGNOSIS — R109 Unspecified abdominal pain: Secondary | ICD-10-CM

## 2021-06-02 DIAGNOSIS — R6520 Severe sepsis without septic shock: Secondary | ICD-10-CM | POA: Diagnosis present

## 2021-06-02 LAB — COMPREHENSIVE METABOLIC PANEL
ALT: 40 U/L (ref 0–44)
AST: 33 U/L (ref 15–41)
Albumin: 3.4 g/dL — ABNORMAL LOW (ref 3.5–5.0)
Alkaline Phosphatase: 56 U/L (ref 38–126)
Anion gap: 7 (ref 5–15)
BUN: 13 mg/dL (ref 8–23)
CO2: 25 mmol/L (ref 22–32)
Calcium: 8.7 mg/dL — ABNORMAL LOW (ref 8.9–10.3)
Chloride: 104 mmol/L (ref 98–111)
Creatinine, Ser: 0.91 mg/dL (ref 0.61–1.24)
GFR, Estimated: >60 mL/min (ref >60)
Glucose, Bld: 146 mg/dL — ABNORMAL HIGH (ref 70–99)
Potassium: 3.6 mmol/L (ref 3.5–5.1)
Sodium: 136 mmol/L (ref 135–145)
Total Bilirubin: 0.7 mg/dL (ref 0.3–1.2)
Total Protein: 7.4 g/dL (ref 6.5–8.1)

## 2021-06-02 LAB — CBC WITH DIFFERENTIAL/PLATELET
Abs Immature Granulocytes: 0.01 10*3/uL (ref 0.00–0.07)
Basophils Absolute: 0 10*3/uL (ref 0.0–0.1)
Basophils Relative: 1 %
Eosinophils Absolute: 0.1 10*3/uL (ref 0.0–0.5)
Eosinophils Relative: 3 %
HCT: 32 % — ABNORMAL LOW (ref 39.0–52.0)
Hemoglobin: 10.5 g/dL — ABNORMAL LOW (ref 13.0–17.0)
Immature Granulocytes: 0 %
Lymphocytes Relative: 53 %
Lymphs Abs: 1.3 10*3/uL (ref 0.7–4.0)
MCH: 36.8 pg — ABNORMAL HIGH (ref 26.0–34.0)
MCHC: 32.8 g/dL (ref 30.0–36.0)
MCV: 112.3 fL — ABNORMAL HIGH (ref 80.0–100.0)
Monocytes Absolute: 0.3 10*3/uL (ref 0.1–1.0)
Monocytes Relative: 13 %
Neutro Abs: 0.7 10*3/uL — ABNORMAL LOW (ref 1.7–7.7)
Neutrophils Relative %: 30 %
Platelets: 107 10*3/uL — ABNORMAL LOW (ref 150–400)
RBC: 2.85 MIL/uL — ABNORMAL LOW (ref 4.22–5.81)
RDW: 16.5 % — ABNORMAL HIGH (ref 11.5–15.5)
WBC: 2.3 10*3/uL — ABNORMAL LOW (ref 4.0–10.5)
nRBC: 0 % (ref 0.0–0.2)

## 2021-06-02 LAB — RESP PANEL BY RT-PCR (FLU A&B, COVID) ARPGX2
Influenza A by PCR: NEGATIVE
Influenza B by PCR: NEGATIVE
SARS Coronavirus 2 by RT PCR: NEGATIVE

## 2021-06-02 LAB — LACTIC ACID, PLASMA
Lactic Acid, Venous: 2.5 mmol/L (ref 0.5–1.9)
Lactic Acid, Venous: 1.9 mmol/L (ref 0.5–1.9)

## 2021-06-02 LAB — HEMOGLOBIN A1C
Hgb A1c MFr Bld: 8.4 % — ABNORMAL HIGH (ref 4.8–5.6)
Mean Plasma Glucose: 194.38 mg/dL

## 2021-06-02 LAB — TROPONIN I (HIGH SENSITIVITY)
Troponin I (High Sensitivity): 32 ng/L — ABNORMAL HIGH (ref <18)
Troponin I (High Sensitivity): 35 ng/L — ABNORMAL HIGH (ref <18)

## 2021-06-02 LAB — CBG MONITORING, ED
Glucose-Capillary: 177 mg/dL — ABNORMAL HIGH (ref 70–99)
Glucose-Capillary: 256 mg/dL — ABNORMAL HIGH (ref 70–99)

## 2021-06-02 LAB — PROCALCITONIN: Procalcitonin: <0.1 ng/mL

## 2021-06-02 LAB — BRAIN NATRIURETIC PEPTIDE: B Natriuretic Peptide: 88.9 pg/mL (ref 0.0–100.0)

## 2021-06-02 LAB — SEDIMENTATION RATE: Sed Rate: 56 mm/hr — ABNORMAL HIGH (ref 0–20)

## 2021-06-02 MED ORDER — VANCOMYCIN HCL 2000 MG/400ML IV SOLN
2000.0000 mg | Freq: Once | INTRAVENOUS | Status: AC
  Administered 2021-06-02: 2000 mg via INTRAVENOUS
  Filled 2021-06-02: qty 400

## 2021-06-02 MED ORDER — DILTIAZEM LOAD VIA INFUSION
10.0000 mg | Freq: Once | INTRAVENOUS | Status: AC
  Administered 2021-06-02: 10 mg via INTRAVENOUS
  Filled 2021-06-02: qty 10

## 2021-06-02 MED ORDER — APIXABAN 5 MG PO TABS
5.0000 mg | ORAL_TABLET | Freq: Two times a day (BID) | ORAL | Status: DC
  Administered 2021-06-02 – 2021-06-04 (×5): 5 mg via ORAL
  Filled 2021-06-02 (×5): qty 1

## 2021-06-02 MED ORDER — DILTIAZEM HCL 25 MG/5ML IV SOLN
INTRAVENOUS | Status: AC
  Filled 2021-06-02: qty 5

## 2021-06-02 MED ORDER — METOPROLOL SUCCINATE ER 25 MG PO TB24
25.0000 mg | ORAL_TABLET | Freq: Two times a day (BID) | ORAL | Status: DC
  Administered 2021-06-03 – 2021-06-04 (×3): 25 mg via ORAL
  Filled 2021-06-02 (×5): qty 1

## 2021-06-02 MED ORDER — PANTOPRAZOLE SODIUM 40 MG PO TBEC
40.0000 mg | DELAYED_RELEASE_TABLET | Freq: Every day | ORAL | Status: DC
  Administered 2021-06-02 – 2021-06-04 (×3): 40 mg via ORAL
  Filled 2021-06-02 (×3): qty 1

## 2021-06-02 MED ORDER — OXYCODONE-ACETAMINOPHEN 5-325 MG PO TABS: 1.0000 | ORAL_TABLET | Freq: Three times a day (TID) | ORAL | Status: DC | PRN

## 2021-06-02 MED ORDER — SODIUM CHLORIDE 0.9 % IV BOLUS
500.0000 mL | Freq: Once | INTRAVENOUS | Status: AC
  Administered 2021-06-02 (×2): 500 mL via INTRAVENOUS

## 2021-06-02 MED ORDER — INSULIN ASPART PROT & ASPART (70-30 MIX) 100 UNIT/ML ~~LOC~~ SUSP
70.0000 [IU] | Freq: Two times a day (BID) | SUBCUTANEOUS | Status: DC
  Administered 2021-06-02 – 2021-06-04 (×3): 70 [IU] via SUBCUTANEOUS
  Filled 2021-06-02 (×2): qty 10

## 2021-06-02 MED ORDER — LORATADINE 10 MG PO TABS
10.0000 mg | ORAL_TABLET | Freq: Every day | ORAL | Status: DC
  Administered 2021-06-02 – 2021-06-04 (×3): 10 mg via ORAL
  Filled 2021-06-02 (×3): qty 1

## 2021-06-02 MED ORDER — ALBUTEROL SULFATE HFA 108 (90 BASE) MCG/ACT IN AERS: 2.0000 | INHALATION_SPRAY | RESPIRATORY_TRACT | Status: DC | PRN

## 2021-06-02 MED ORDER — INSULIN ASPART 100 UNIT/ML IJ SOLN
0.0000 [IU] | Freq: Three times a day (TID) | INTRAMUSCULAR | Status: DC
  Administered 2021-06-02: 5 [IU] via SUBCUTANEOUS
  Administered 2021-06-03: 2 [IU] via SUBCUTANEOUS
  Administered 2021-06-03: 5 [IU] via SUBCUTANEOUS
  Administered 2021-06-03 – 2021-06-04 (×2): 2 [IU] via SUBCUTANEOUS
  Administered 2021-06-04: 3 [IU] via SUBCUTANEOUS
  Filled 2021-06-02 (×7): qty 1

## 2021-06-02 MED ORDER — DILTIAZEM HCL-DEXTROSE 125-5 MG/125ML-% IV SOLN (PREMIX)
5.0000 mg/h | INTRAVENOUS | Status: DC
Start: 2021-06-02 — End: 2021-06-02
  Administered 2021-06-02: 5 mg/h via INTRAVENOUS

## 2021-06-02 MED ORDER — ASPIRIN EC 81 MG PO TBEC
81.0000 mg | DELAYED_RELEASE_TABLET | Freq: Every day | ORAL | Status: DC
  Administered 2021-06-02 – 2021-06-04 (×3): 81 mg via ORAL
  Filled 2021-06-02 (×3): qty 1

## 2021-06-02 MED ORDER — ONDANSETRON HCL 4 MG/2ML IJ SOLN: 4.0000 mg | Freq: Three times a day (TID) | INTRAMUSCULAR | Status: DC | PRN

## 2021-06-02 MED ORDER — ATORVASTATIN CALCIUM 80 MG PO TABS
80.0000 mg | ORAL_TABLET | Freq: Every evening | ORAL | Status: DC
  Administered 2021-06-02 – 2021-06-04 (×3): 80 mg via ORAL
  Filled 2021-06-02: qty 4
  Filled 2021-06-02 (×2): qty 1

## 2021-06-02 MED ORDER — VANCOMYCIN HCL 1750 MG/350ML IV SOLN
1750.0000 mg | INTRAVENOUS | Status: DC
  Administered 2021-06-03: 1750 mg via INTRAVENOUS
  Filled 2021-06-02: qty 350

## 2021-06-02 MED ORDER — NITROGLYCERIN 0.4 MG SL SUBL: 0.4000 mg | SUBLINGUAL_TABLET | SUBLINGUAL | Status: DC | PRN

## 2021-06-02 MED ORDER — ACETAMINOPHEN 325 MG PO TABS: 650.0000 mg | ORAL_TABLET | Freq: Four times a day (QID) | ORAL | Status: DC | PRN

## 2021-06-02 MED ORDER — SODIUM CHLORIDE 0.9 % IV SOLN: INTRAVENOUS | Status: DC

## 2021-06-02 MED ORDER — CALCIUM CHLORIDE 10 % IV SOLN
1.0000 g | Freq: Once | INTRAVENOUS | Status: AC
  Administered 2021-06-02: 1 g via INTRAVENOUS
  Filled 2021-06-02: qty 10

## 2021-06-02 MED ORDER — AMIODARONE HCL 200 MG PO TABS
400.0000 mg | ORAL_TABLET | Freq: Two times a day (BID) | ORAL | Status: DC
  Administered 2021-06-02 – 2021-06-04 (×5): 400 mg via ORAL
  Filled 2021-06-02 (×7): qty 2

## 2021-06-02 MED ORDER — INSULIN ASPART 100 UNIT/ML IJ SOLN: 0.0000 [IU] | Freq: Every day | INTRAMUSCULAR | Status: DC

## 2021-06-02 MED ORDER — ALBUTEROL SULFATE (2.5 MG/3ML) 0.083% IN NEBU: 2.5000 mg | INHALATION_SOLUTION | RESPIRATORY_TRACT | Status: DC | PRN

## 2021-06-02 NOTE — Consult Note (Signed)
Pharmacy Antibiotic Note  Jonathan Cross Jonathan Cross. is a 75 y.o. 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  Pharmacy has been consulted for Vancomycin dosing. Patient received loading dose of 2g IV in ED.  Plan: Vancomycin 1750 mg IV Q 24 hrs. Goal AUC 400-550. Expected AUC: 528.4 Expected Css: 11.0 SCr used: 0.91   Height: 5\' 9"  (175.3 cm) Weight: 108.9 kg (240 lb) IBW/kg (Calculated) : 70.7  Temp (24hrs), Avg:97.5 F (36.4 C), Min:97.5 F (36.4 C), Max:97.5 F (36.4 C)  Recent Labs  Lab 06/02/21 0926 06/02/21 0927 06/02/21 1210  WBC 2.3*  --   --   CREATININE 0.91  --   --   LATICACIDVEN  --  2.5* 1.9    Estimated Creatinine Clearance: 82.7 mL/min (by C-G formula based on SCr of 0.91 mg/dL).    Allergies  Allergen Reactions   Metformin Diarrhea    Loose stools even with XR    Antimicrobials this admission: Vancomcyin 11/20 >>    Microbiology results: 11/20 BCx: pending   Thank you for allowing pharmacy to be a part of this patient's care.  Tomislav Micale A Mirjana Tarleton 06/02/2021 2:25 PM

## 2021-06-02 NOTE — ED Triage Notes (Addendum)
Pt arrived via [facility]. Per EMS, pt states that they have been having chest pain waking this morning. EMS gave pt 5mg  of metoprolol. Pt state the pain is in the center of their chest. Pt has a hx of Afib and SVT. Pt states they were hospitalized for an infection 2 weeks ago and is currently on antibiotics.

## 2021-06-02 NOTE — Consult Note (Addendum)
Cardiology Consultation:   Patient ID: Jonathan Cross. MRN: [id number]; DOB: [birth year]  Admit date: 06/02/2021 Date of Consult: 06/02/2021  PCP:  Georges Mouse, MD   [place] Providers Cardiologist: Followed by [facility] cardiology Dr. Ishmael Holter Physician requesting consult: Dr. Blaine Hamper Reason for consult: Atrial fibrillation with RVR   Patient Profile:   Jonathan Cross. is a 75 y.o. 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 This morning reports even woke up felt well then developed acute respiratory distress, chest fullness Reports having atrial fibrillation in the past managed with diltiazem but self discontinued the diltiazem secondary to GI upset [facility] cardiology raised the dose of his metoprolol Denies any recent triggers that could have precipitated atrial fibrillation this admission Presented going atrial fibrillation rate 160 bpm Started on diltiazem infusion in the emergency room, this was soon held secondary to hypotension ER doctor ordered dose of vancomycin for concern for scalp cellulitis, history of MRSA, PE  Late morning converting to normal sinus rhythm On my evaluation reported feeling better, was maintaining normal sinus rhythm Initially hypotensive on the diltiazem and for several hours of recovery, blood pressure improving 130 up to 148  Reports compliance with his Eliquis    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   Coronary artery disease    Diabetes ([place])    H/O aortic valve replacement    History of kidney stones    Hx of CABG    Hyperlipidemia    Hypertension    Sleep apnea     Past Surgical  History:  Procedure Laterality Date   AORTIC VALVE REPLACEMENT  2017   [facility], bioprosthetic   APPENDECTOMY     CARDIAC VALVE REPLACEMENT     CORONARY ARTERY BYPASS GRAFT  2017   [facility] - LIMA-LAD and SVG-OM   CYSTOSCOPY W/ RETROGRADES Right 05/27/2019   Procedure: CYSTOSCOPY WITH RETROGRADE PYELOGRAM;  Surgeon: Billey Co, MD;  Location: [facility];  Service: Urology;  Laterality: Right;   CYSTOSCOPY W/ RETROGRADES Right 07/11/2019   Procedure: CYSTOSCOPY WITH RETROGRADE PYELOGRAM;  Surgeon: Billey Co, MD;  Location: [facility];  Service: Urology;  Laterality: Right;   CYSTOSCOPY/URETEROSCOPY/HOLMIUM LASER/STENT PLACEMENT Right 05/27/2019   Procedure: CYSTOSCOPY/URETEROSCOPY/HOLMIUM LASER/STENT PLACEMENT;  Surgeon: Billey Co, MD;  Location: [facility];  Service: Urology;  Laterality: Right;   CYSTOSCOPY/URETEROSCOPY/HOLMIUM LASER/STENT PLACEMENT Right 06/17/2019   Procedure: CYSTOSCOPY/URETEROSCOPY/HOLMIUM LASER/STENT Exchange;  Surgeon: Billey Co, MD;  Location: [facility];  Service: Urology;  Laterality: Right;   CYSTOSCOPY/URETEROSCOPY/HOLMIUM LASER/STENT PLACEMENT Right 07/11/2019   Procedure: CYSTOSCOPY/URETEROSCOPY/HOLMIUM LASER/STENT Exchange;  Surgeon: Billey Co, MD;  Location: [facility];  Service: Urology;  Laterality: Right;   STONE EXTRACTION WITH BASKET Right 07/11/2019   Procedure: STONE EXTRACTION WITH BASKET;  Surgeon: Billey Co, MD;  Location: [facility];  Service: Urology;  Laterality: Right;   TEE WITHOUT CARDIOVERSION N/A 04/21/2019   Procedure: TRANSESOPHAGEAL ECHOCARDIOGRAM (TEE);  Surgeon: Minna Merritts, MD;  Location: [facility];  Service: Cardiovascular;  Laterality: N/A;   TEE WITHOUT CARDIOVERSION N/A 07/04/2019   Procedure: TRANSESOPHAGEAL ECHOCARDIOGRAM (TEE);  Surgeon: Wellington Hampshire, MD;  Location: [facility];  Service: Cardiovascular;  Laterality: N/A;   TEE WITHOUT CARDIOVERSION N/A 08/17/2019   Procedure: TRANSESOPHAGEAL ECHOCARDIOGRAM  (TEE);  Surgeon: Kate Sable, MD;  Location: [facility];  Service: Cardiovascular;  Laterality: N/A;   TEE WITHOUT CARDIOVERSION N/A 11/15/2019   Procedure: TRANSESOPHAGEAL ECHOCARDIOGRAM (TEE);  Surgeon: Nelva Bush, MD;  Location: [facility];  Service: Cardiovascular;  Laterality: N/A;   TONSILLECTOMY       Home Medications:  Prior to Admission medications   Medication Sig Start Date End Date Taking? Authorizing Provider  aspirin EC 81 MG tablet Take 81 mg by mouth daily.  05/30/14  Yes [provider]  atorvastatin (LIPITOR) 80 MG tablet Take 80 mg by mouth every evening.  08/13/18  Yes [provider]  AZO-CRANBERRY PO Take 1 tablet by mouth daily.    Yes [provider]  ELIQUIS 5 MG TABS tablet Take 5 mg by mouth 2 (two) times daily. 04/18/21  Yes [provider]  loratadine (CLARITIN) 10 MG tablet Take 10 mg by mouth daily. 09/13/18 06/02/21 Yes [provider]  NOVOLIN 70/30 RELION (70-30) 100 UNIT/ML injection Inject 100 Units into the skin 2 (two) times daily.  05/13/19  Yes [provider]  omeprazole (PRILOSEC) 20 MG capsule Take 20 mg by mouth every evening. 04/22/18 06/02/21 Yes [provider]  Potassium Citrate 15 MEQ (1620 MG) TBCR Take 2 tablets by mouth 2 (two) times daily. Patient taking differently: Take 1 tablet by mouth 2 (two) times daily. 07/20/19  Yes Billey Co, MD  tamsulosin (FLOMAX) 0.4 MG CAPS capsule Take 0.4 mg by mouth daily. 03/06/21  Yes [provider]  amLODipine (NORVASC) 5 MG tablet Take 5 mg by mouth daily. Patient not taking: Reported on 06/02/2021 03/08/19   [provider]  amoxicillin (AMOXIL) 500 MG capsule Take 1 capsule (500 mg total) by mouth in the morning and at bedtime. Patient not taking: Reported on 04/19/2021 11/08/19   Tsosie Billing, MD  Ascorbic Acid (VITAMIN C WITH ROSE HIPS) 500 MG tablet Take 1,000 mg by mouth daily.  Patient not taking:  Reported on 06/02/2021    [provider]  calcium carbonate (OSCAL) 1500 (600 Ca) MG TABS tablet Take 1 tablet by mouth every morning. Patient not taking: Reported on 06/02/2021    [provider]  glipiZIDE (GLUCOTROL XL) 2.5 MG 24 hr tablet Take 2.5 mg by mouth daily with breakfast.  Patient not taking: Reported on 06/02/2021 05/05/19   [provider]  insulin aspart (NOVOLOG) 100 UNIT/ML injection Inject 0-5 Units into the skin at bedtime. Patient not taking: Reported on 11/15/2019 04/23/19   Epifanio Lesches, MD  isosorbide mononitrate (IMDUR) 60 MG 24 hr tablet Take 1 tablet (60 mg total) by mouth daily. Patient not taking: Reported on 06/02/2021 05/12/19 11/15/19  End, Harrell Gave, MD  lisinopril (ZESTRIL) 20 MG tablet Take 1 tablet by mouth once daily Patient not taking: Reported on 06/02/2021 11/28/19   End, Harrell Gave, MD  metoprolol succinate (TOPROL-XL) 25 MG 24 hr tablet Take 2 tablets (50 mg total) by mouth daily. Patient taking differently: Take 75 mg by mouth daily. 03/02/21   Vanessa Wahiawa, MD  nitroGLYCERIN (NITROSTAT) 0.4 MG SL tablet Place 0.4 mg under the tongue every 5 (five) minutes x 3 doses as needed for chest pain.  03/24/18 11/15/19  [provider]  ondansetron (ZOFRAN-ODT) 4 MG disintegrating tablet Take 4 mg by mouth every 8 (eight) hours as needed for nausea or vomiting. Patient not taking: Reported on  06/02/2021    [provider]  oxyCODONE-acetaminophen (PERCOCET) 5-325 MG tablet Take 1 tablet by mouth every 8 (eight) hours as needed. Patient taking differently: Take 1 tablet by mouth every 8 (eight) hours as needed (pain.). 10/19/19   Earleen Newport, MD  terbinafine (LAMISIL AT) 1 % cream Apply 1 application topically 2 (two) times daily. 11/08/19   Tsosie Billing, MD    Inpatient Medications: Scheduled Meds:  amiodarone  400 mg Oral BID   apixaban  5 mg Oral BID   aspirin EC  81 mg Oral Daily    atorvastatin  80 mg Oral QPM   diltiazem       insulin aspart  0-5 Units Subcutaneous QHS   insulin aspart  0-9 Units Subcutaneous TID WC   insulin aspart protamine- aspart  70 Units Subcutaneous BID WC   loratadine  10 mg Oral Daily   pantoprazole  40 mg Oral Daily   Continuous Infusions:  sodium chloride 75 mL/hr at 06/02/21 1400   [START ON 06/03/2021] vancomycin     PRN Meds: acetaminophen, albuterol, nitroGLYCERIN, ondansetron (ZOFRAN) IV, oxyCODONE-acetaminophen  Allergies:    Allergies  Allergen Reactions   Metformin Diarrhea    Loose stools even with XR    Social History:   Social History   Socioeconomic History   Marital status: Married    Spouse name: Not on file   Number of children: Not on file   Years of education: Not on file   Highest education level: Not on file  Occupational History   Not on file  Tobacco Use   Smoking status: Former   Smokeless tobacco: Never  Vaping Use   Vaping Use: Never used  Substance and Sexual Activity   Alcohol use: Never   Drug use: Never   Sexual activity: Not on file  Other Topics Concern   Not on file  Social History Narrative   Not on file   Social Determinants of Health   Financial Resource Strain: Not on file  Food Insecurity: Not on file  Transportation Needs: Not on file  Physical Activity: Not on file  Stress: Not on file  Social Connections: Not on file  Intimate Partner Violence: Not on file    Family History:    Family History  Problem Relation Age of Onset   Heart attack Father 83     ROS:  Please see the history of present illness.  Review of Systems  Constitutional: Negative.   HENT: Negative.    Respiratory:  Positive for shortness of breath.   Cardiovascular:  Positive for palpitations.  Gastrointestinal: Negative.   Musculoskeletal: Negative.   Neurological: Negative.   Psychiatric/Behavioral: Negative.    All other systems reviewed and are negative.   Physical Exam/Data:    Vitals:   06/02/21 1300 06/02/21 1330 06/02/21 1400 06/02/21 1430  BP: (!) 127/54 104/61 92/79 133/61  Pulse: 77 77 94 93  Resp: 17 15 (!) 21 16  Temp:      TempSrc:      SpO2: 97% 96% 98% 98%  Weight:      Height:        Intake/Output Summary (Last 24 hours) at 06/02/2021 1521 Last data filed at 06/02/2021 1439 Gross per 24 hour  Intake 900 ml  Output 175 ml  Net 725 ml   Last 3 Weights 06/02/2021 04/19/2021 03/02/2021  Weight (lbs) 240 lb 256 lb 12.8 oz 250 lb  Weight (kg) 108.863 kg 116.484 kg 113.399 kg  Body mass index is 35.44 kg/m.  General:  Well nourished, well developed, in no acute distress HEENT: normal Neck: no JVD Vascular: No carotid bruits; Distal pulses 2+ bilaterally Cardiac:  normal S1, S2; RRR; no murmur  Lungs:  clear to auscultation bilaterally, no wheezing, rhonchi or rales  Abd: soft, nontender, no hepatomegaly  Ext: no edema Musculoskeletal:  No deformities, BUE and BLE strength normal and equal Skin: warm and dry  Neuro:  CNs 2-12 intact, no focal abnormalities noted Psych:  Normal affect   EKG:  The EKG was personally reviewed and demonstrates:   Atrial fibrillation ventricular rate 142 bpm T wave abnormality anterolateral leads V5, V6, I and aVL  Telemetry:  Telemetry was personally reviewed and demonstrates:   Normal sinus rhythm  Relevant CV Studies:  Echo May 13, 2021   1. The left ventricle is normal in size with mildly increased wall  thickness.    2. The left ventricular systolic function is normal, LVEF is visually  estimated at 65-70%.    3. The right ventricle is normal in size, with normal systolic function.    4. Aortic valve replacement (23 mm bioprosthetic, implantation date:  01/2016).    5. The prosthetic aortic valve leaflets are not well visualized.    6. Aortic valve Doppler indices are consistent with normal prosthetic valve  function in a high flow state (peak V >1m/s, DVI 0.4, accel time <126ms)..    7.  Rhythm: Tachycardia.   Laboratory Data:  High Sensitivity Troponin:   Recent Labs  Lab 06/02/21 0926 06/02/21 1210  TROPONINIHS 32* 35*     Chemistry Recent Labs  Lab 06/02/21 0926  NA 136  K 3.6  CL 104  CO2 25  GLUCOSE 146*  BUN 13  CREATININE 0.91  CALCIUM 8.7*  GFRNONAA >60  ANIONGAP 7    Recent Labs  Lab 06/02/21 0926  PROT 7.4  ALBUMIN 3.4*  AST 33  ALT 40  ALKPHOS 56  BILITOT 0.7   Lipids No results for input(s): CHOL, TRIG, HDL, LABVLDL, LDLCALC, CHOLHDL in the last 168 hours.  Hematology Recent Labs  Lab 06/02/21 0926  WBC 2.3*  RBC 2.85*  HGB 10.5*  HCT 32.0*  MCV 112.3*  MCH 36.8*  MCHC 32.8  RDW 16.5*  PLT 107*   Thyroid No results for input(s): TSH, FREET4 in the last 168 hours.  BNP Recent Labs  Lab 06/02/21 0927  BNP 88.9    DDimer No results for input(s): DDIMER in the last 168 hours.   Radiology/Studies:  DG Chest Portable 1 View  Result Date: 06/02/2021 CLINICAL DATA:  Chest pain, shortness of breath EXAM: PORTABLE CHEST 1 VIEW COMPARISON:  04/19/2021 FINDINGS: Cardiomegaly status post median sternotomy. Mild pulmonary vascular prominence and diffuse bilateral interstitial opacity. The visualized skeletal structures are unremarkable. IMPRESSION: Cardiomegaly with mild pulmonary vascular prominence and diffuse bilateral interstitial opacity, likely edema. No focal airspace opacity. Electronically Signed   By: Delanna Ahmadi M.D.   On: 06/02/2021 09:58     Assessment and Plan:   Atrial fibrillation with RVR History of paroxysmal atrial fibrillation, did not tolerate diltiazem secondary to GI upset -Maintained on Eliquis 5 twice daily, metoprolol succinate 50 daily -History of ischemic stroke with hospitalization May 2022 Since then seen in the ER at [facility] for atrial fibrillation with RVR rates up to 150 Given high risk of recurrent arrhythmia, and very symptomatic, frequent visits to the emergency room for arrhythmia -We will  recommend he  start amiodarone with load 400 twice daily for 7 days then down to 200 twice daily, continue Eliquis 5 twice daily -We have restarted his metoprolol succinate 50 daily  2.  Coronary artery disease with stable angina No plan for ischemic work-up at this time Continue outpatient medications  3.  Scalp cellulitis On Vanco  4.  Bacteremia due to Enterococcus, October 2020 History of aortic valve replacement with bioprosthetic valve Work-up at that time with TEE, cardiac CT, work-up at [facility] and transferred to [facility] for further work-up -Mild pancytopenia,  blood cultures pending, sed rate pending Low procalcitonin   Total encounter time more than 110 minutes  Greater than 50% was spent in counseling and coordination of care with the patient   For questions or updates, please contact [place] Please consult [email or web address] for contact info under    Signed, Ida Rogue, MD  06/02/2021 3:21 PM

## 2021-06-02 NOTE — ED Notes (Signed)
Sent note to pharmacy to send amiodarone.

## 2021-06-02 NOTE — H&P (Signed)
History and Physical    Jonathan Cross. Jonathan Cross DOB: Feb 07, 1946 DOA: 06/02/2021  Referring MD/NP/PA:   PCP: Jonathan Mouse, MD   Patient coming from:  The patient is coming from home.    Chief Complaint: palpitation  HPI: Jonathan Cross. is a 75 y.o. male with medical history significant of atrial fibrillation on Eliquis, hypertension, hyperlipidemia, diabetes mellitus, GERD, OSA not on CPAP, CAD, CABG, aortic valve replacement (bioprosthesis), aortic valve bacterial endocarditis, BPH, SVT, occipital scalp infection, who presents with palpitation.  Pt was recently hospitalized to [facility] due to sepsis secondary to occipital scalp cellulitis. The purulent drainage was cultured and revealed to grow MRSA. He was initiated treated with vancomycin and cefepime, and discharged on doxycycline (MRSA is susceptible) and cefdinir. Pt states that he felt better initially, but developed palpitation, heart racing and lightheadedness.  Associated with shortness of breath and mild chest pressure-like pain.  He also feels generalized weak.  Patient was found to have A. fib with RVR, with heart rate up to 160s. Patient was given lopressor 5 mg IV with improvement in HR but drop in BP to 84/45.  Then 1 g of calcium gluconate was given by EDP.  His rhythm converted to sinus rhythm.  Currently heart rate in the 80s.  Her blood pressure is still soft.  His chest pain has resolved. Patient has nausea, denies vomiting, diarrhea or abdominal pain.  No symptoms of UTI.  No unilateral numbness or tingling to extremities.  He states that he still has tenderness in the occipital area where patient has scalp cellulitis.  No fever   ED Course: pt was found to have pancytopenia with WBC 2.3, hemoglobin 10.5, platelet 107 (patient had WBC 4.4, hemoglobin 12.4, platelet 122 on 04/19/2021), lactic acid 2.5, troponin level 32, pending COVID PCR, electrolytes renal function okay, blood pressure 102/59, RR 24,  oxygen saturation 95% on room air.  Chest x-ray showed cardiomegaly, mild vascular congestion and interstitial obesity.  Patient is placed on progressive bed for observation, Dr. Rockey Situ of cardiology is consulted.   Review of Systems:   General: no fevers, chills, no body weight gain, has fatigue HEENT: no blurry vision, hearing changes or sore throat Respiratory: Has dyspnea, coughing, no wheezing CV: Has chest pain, palpitations GI: Has nausea, no vomiting, abdominal pain, diarrhea, constipation GU: no dysuria, burning on urination, increased urinary frequency, hematuria  Ext: Has mild leg edema Neuro: no unilateral weakness, numbness, or tingling, no vision change or hearing loss Skin: Has occipital scalp infection MSK: No muscle spasm, no deformity, no limitation of range of movement in spin Heme: No easy bruising.  Travel history: No recent long distant travel.  Allergy:  Allergies  Allergen Reactions   Metformin Diarrhea    Loose stools even with XR    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   Coronary artery disease    Diabetes ([place])    H/O aortic valve replacement    History of kidney stones    Hx of CABG    Hyperlipidemia    Hypertension    Sleep apnea     Past Surgical History:  Procedure Laterality Date   AORTIC VALVE REPLACEMENT  2017   [facility], bioprosthetic   APPENDECTOMY     CARDIAC VALVE REPLACEMENT     CORONARY ARTERY BYPASS GRAFT  2017   [facility] - LIMA-LAD and SVG-OM   CYSTOSCOPY W/ RETROGRADES Right 05/27/2019   Procedure: CYSTOSCOPY WITH RETROGRADE  PYELOGRAM;  Surgeon: Billey Co, MD;  Location: [facility];  Service: Urology;  Laterality: Right;   CYSTOSCOPY W/ RETROGRADES Right 07/11/2019   Procedure: CYSTOSCOPY WITH RETROGRADE PYELOGRAM;  Surgeon: Billey Co, MD;  Location: [facility];  Service: Urology;  Laterality: Right;   CYSTOSCOPY/URETEROSCOPY/HOLMIUM LASER/STENT PLACEMENT Right 05/27/2019   Procedure:  CYSTOSCOPY/URETEROSCOPY/HOLMIUM LASER/STENT PLACEMENT;  Surgeon: Billey Co, MD;  Location: [facility];  Service: Urology;  Laterality: Right;   CYSTOSCOPY/URETEROSCOPY/HOLMIUM LASER/STENT PLACEMENT Right 06/17/2019   Procedure: CYSTOSCOPY/URETEROSCOPY/HOLMIUM LASER/STENT Exchange;  Surgeon: Billey Co, MD;  Location: [facility];  Service: Urology;  Laterality: Right;   CYSTOSCOPY/URETEROSCOPY/HOLMIUM LASER/STENT PLACEMENT Right 07/11/2019   Procedure: CYSTOSCOPY/URETEROSCOPY/HOLMIUM LASER/STENT Exchange;  Surgeon: Billey Co, MD;  Location: [facility];  Service: Urology;  Laterality: Right;   STONE EXTRACTION WITH BASKET Right 07/11/2019   Procedure: STONE EXTRACTION WITH BASKET;  Surgeon: Billey Co, MD;  Location: [facility];  Service: Urology;  Laterality: Right;   TEE WITHOUT CARDIOVERSION N/A 04/21/2019   Procedure: TRANSESOPHAGEAL ECHOCARDIOGRAM (TEE);  Surgeon: Minna Merritts, MD;  Location: [facility];  Service: Cardiovascular;  Laterality: N/A;   TEE WITHOUT CARDIOVERSION N/A 07/04/2019   Procedure: TRANSESOPHAGEAL ECHOCARDIOGRAM (TEE);  Surgeon: Wellington Hampshire, MD;  Location: [facility];  Service: Cardiovascular;  Laterality: N/A;   TEE WITHOUT CARDIOVERSION N/A 08/17/2019   Procedure: TRANSESOPHAGEAL ECHOCARDIOGRAM (TEE);  Surgeon: Kate Sable, MD;  Location: [facility];  Service: Cardiovascular;  Laterality: N/A;   TEE WITHOUT CARDIOVERSION N/A 11/15/2019   Procedure: TRANSESOPHAGEAL ECHOCARDIOGRAM (TEE);  Surgeon: Nelva Bush, MD;  Location: [facility];  Service: Cardiovascular;  Laterality: N/A;   TONSILLECTOMY      Social History:  reports that he has quit smoking. He has never used smokeless tobacco. He reports that he does not drink alcohol and does not use drugs.  Family History:  Family History  Problem Relation Age of Onset   Heart attack Father 84     Prior to Admission medications   Medication Sig Start Date End Date Taking? Authorizing Provider   aspirin EC 81 MG tablet Take 81 mg by mouth daily.  05/30/14  Yes [provider]  atorvastatin (LIPITOR) 80 MG tablet Take 80 mg by mouth every evening.  08/13/18  Yes [provider]  AZO-CRANBERRY PO Take 1 tablet by mouth daily.    Yes [provider]  ELIQUIS 5 MG TABS tablet Take 5 mg by mouth 2 (two) times daily. 04/18/21  Yes [provider]  loratadine (CLARITIN) 10 MG tablet Take 10 mg by mouth daily. 09/13/18 06/02/21 Yes [provider]  NOVOLIN 70/30 RELION (70-30) 100 UNIT/ML injection Inject 100 Units into the skin 2 (two) times daily.  05/13/19  Yes [provider]  omeprazole (PRILOSEC) 20 MG capsule Take 20 mg by mouth every evening. 04/22/18 06/02/21 Yes [provider]  Potassium Citrate 15 MEQ (1620 MG) TBCR Take 2 tablets by mouth 2 (two) times daily. Patient taking differently: Take 1 tablet by mouth 2 (two) times daily. 07/20/19  Yes Billey Co, MD  tamsulosin (FLOMAX) 0.4 MG CAPS capsule Take 0.4 mg by mouth daily. 03/06/21  Yes [provider]  amLODipine (NORVASC) 5 MG tablet Take 5 mg by mouth daily. Patient not taking: Reported on 06/02/2021 03/08/19   [provider]  amoxicillin (AMOXIL) 500 MG capsule Take 1 capsule (500 mg total) by mouth in the morning and at bedtime. Patient not taking: Reported on 04/19/2021 11/08/19  Tsosie Billing, MD  Ascorbic Acid (VITAMIN C WITH ROSE HIPS) 500 MG tablet Take 1,000 mg by mouth daily.  Patient not taking: Reported on 06/02/2021    [provider]  calcium carbonate (OSCAL) 1500 (600 Ca) MG TABS tablet Take 1 tablet by mouth every morning. Patient not taking: Reported on 06/02/2021    [provider]  diltiazem (CARDIZEM CD) 240 MG 24 hr capsule Take 240 mg by mouth every morning. 02/01/21   [provider]  glipiZIDE (GLUCOTROL XL) 2.5 MG 24 hr tablet Take 2.5 mg by mouth daily with breakfast.  Patient not  taking: Reported on 06/02/2021 05/05/19   [provider]  insulin aspart (NOVOLOG) 100 UNIT/ML injection Inject 0-5 Units into the skin at bedtime. Patient not taking: Reported on 11/15/2019 04/23/19   Epifanio Lesches, MD  isosorbide mononitrate (IMDUR) 60 MG 24 hr tablet Take 1 tablet (60 mg total) by mouth daily. 05/12/19 11/15/19  End, Harrell Gave, MD  lisinopril (ZESTRIL) 20 MG tablet Take 1 tablet by mouth once daily Patient not taking: Reported on 06/02/2021 11/28/19   End, Harrell Gave, MD  metoprolol succinate (TOPROL-XL) 25 MG 24 hr tablet Take 2 tablets (50 mg total) by mouth daily. Patient taking differently: Take 75 mg by mouth daily. 03/02/21   Vanessa Neenah, MD  nitroGLYCERIN (NITROSTAT) 0.4 MG SL tablet Place 0.4 mg under the tongue every 5 (five) minutes x 3 doses as needed for chest pain.  03/24/18 11/15/19  [provider]  ondansetron (ZOFRAN-ODT) 4 MG disintegrating tablet Take 4 mg by mouth every 8 (eight) hours as needed for nausea or vomiting. Patient not taking: Reported on 06/02/2021    [provider]  oxyCODONE-acetaminophen (PERCOCET) 5-325 MG tablet Take 1 tablet by mouth every 8 (eight) hours as needed. Patient taking differently: Take 1 tablet by mouth every 8 (eight) hours as needed (pain.).  10/19/19   Earleen Newport, MD  terbinafine (LAMISIL AT) 1 % cream Apply 1 application topically 2 (two) times daily. 11/08/19   Tsosie Billing, MD    Physical Exam: Vitals:   06/02/21 1400 06/02/21 1430 06/02/21 1600 06/02/21 1732  BP: 92/79 133/61 (!) 119/53 122/65  Pulse: 94 93 77 81  Resp: (!) 21 16 15  (!) 23  Temp:      TempSrc:      SpO2: 98% 98% 97% 99%  Weight:      Height:       General: Not in acute distress HEENT:       Eyes: PERRL, EOMI, no scleral icterus.       ENT: No discharge from the ears and nose, no pharynx injection, no tonsillar enlargement.        Neck: No JVD, no bruit, no mass felt. Heme: No neck lymph node  enlargement. Cardiac: S1/S2, RRR, No murmurs, No gallops or rubs. Respiratory: No rales, wheezing, rhonchi or rubs. GI: Soft, nondistended, nontender, no rebound pain, no organomegaly, BS present. GU: No hematuria Ext: Has trace leg edema bilaterally. 1+DP/PT pulse bilaterally. Musculoskeletal: No joint deformities, No joint redness or warmth, no limitation of ROM in spin. Skin: Has tenderness, erythema in occipital scalp Neuro: Alert, oriented X3, cranial nerves II-XII grossly intact, moves all extremities normally.  Psych: Patient is not psychotic, no suicidal or hemocidal ideation.  Labs on Admission: I have personally reviewed following labs and imaging studies  CBC: Recent Labs  Lab 06/02/21 0926  WBC 2.3*  NEUTROABS 0.7*  HGB 10.5*  HCT 32.0*  MCV 112.3*  PLT 580*   Basic Metabolic Panel: Recent Labs  Lab 06/02/21 0926  NA 136  K 3.6  CL 104  CO2 25  GLUCOSE 146*  BUN 13  CREATININE 0.91  CALCIUM 8.7*   GFR: Estimated Creatinine Clearance: 82.7 mL/min (by C-G formula based on SCr of 0.91 mg/dL). Liver Function Tests: Recent Labs  Lab 06/02/21 0926  AST 33  ALT 40  ALKPHOS 56  BILITOT 0.7  PROT 7.4  ALBUMIN 3.4*   No results for input(s): LIPASE, AMYLASE in the last 168 hours. No results for input(s): AMMONIA in the last 168 hours. Coagulation Profile: No results for input(s): INR, PROTIME in the last 168 hours. Cardiac Enzymes: No results for input(s): CKTOTAL, CKMB, CKMBINDEX, TROPONINI in the last 168 hours. BNP (last 3 results) No results for input(s): PROBNP in the last 8760 hours. HbA1C: No results for input(s): HGBA1C in the last 72 hours. CBG: Recent Labs  Lab 06/02/21 1743  GLUCAP 177*   Lipid Profile: No results for input(s): CHOL, HDL, LDLCALC, TRIG, CHOLHDL, LDLDIRECT in the last 72 hours. Thyroid Function Tests: No results for input(s): TSH, T4TOTAL, FREET4, T3FREE, THYROIDAB in the last 72 hours. Anemia Panel: No results for  input(s): VITAMINB12, FOLATE, FERRITIN, TIBC, IRON, RETICCTPCT in the last 72 hours. Urine analysis:    Component Value Date/Time   COLORURINE YELLOW (A) 08/13/2019 1100   APPEARANCEUR CLEAR (A) 08/13/2019 1100   APPEARANCEUR Cloudy (A) 06/07/2019 1409   LABSPEC 1.011 08/13/2019 1100   PHURINE 5.0 08/13/2019 1100   GLUCOSEU 50 (A) 08/13/2019 1100   HGBUR NEGATIVE 08/13/2019 1100   BILIRUBINUR NEGATIVE 08/13/2019 1100   BILIRUBINUR Negative 06/07/2019 1409   KETONESUR NEGATIVE 08/13/2019 1100   PROTEINUR NEGATIVE 08/13/2019 1100   NITRITE NEGATIVE 08/13/2019 1100   LEUKOCYTESUR NEGATIVE 08/13/2019 1100   Sepsis Labs: @LABRCNTIP (procalcitonin:4,lacticidven:4) ) Recent Results (from the past 240 hour(s))  Resp Panel by RT-PCR (Flu A&B, Covid) Nasopharyngeal Swab     Status: None   Collection Time: 06/02/21 12:10 PM   Specimen: Nasopharyngeal Swab; Nasopharyngeal(NP) swabs in vial transport medium  Result Value Ref Range Status   SARS Coronavirus 2 by RT PCR NEGATIVE NEGATIVE Final    Comment: (NOTE) SARS-CoV-2 target nucleic acids are NOT DETECTED.  The SARS-CoV-2 RNA is generally detectable in upper respiratory specimens during the acute phase of infection. The lowest concentration of SARS-CoV-2 viral copies this assay can detect is 138 copies/mL. A negative result does not preclude SARS-Cov-2 infection and should not be used as the sole basis for treatment or other patient management decisions. A negative result may occur with  improper specimen collection/handling, submission of specimen other than nasopharyngeal swab, presence of viral mutation(s) within the areas targeted by this assay, and inadequate number of viral copies(<138 copies/mL). A negative result must be combined with clinical observations, patient history, and epidemiological information. The expected result is Negative.  Fact Sheet for Patients:  EntrepreneurPulse.com.au  Fact Sheet for  Healthcare Providers:  IncredibleEmployment.be  This test is no t yet approved or cleared by the [place] FDA and  has been authorized for detection and/or diagnosis of SARS-CoV-2 by FDA under an Emergency Use Authorization (EUA). This EUA will remain  in effect (meaning this test can be used) for the duration of the COVID-19 declaration under Section 564(b)(1) of the Act, 21 U.S.C.section 360bbb-3(b)(1), unless the authorization is terminated  or revoked sooner.       Influenza A by PCR NEGATIVE NEGATIVE Final  Influenza B by PCR NEGATIVE NEGATIVE Final    Comment: (NOTE) The Xpert Xpress SARS-CoV-2/FLU/RSV plus assay is intended as an aid in the diagnosis of influenza from Nasopharyngeal swab specimens and should not be used as a sole basis for treatment. Nasal washings and aspirates are unacceptable for Xpert Xpress SARS-CoV-2/FLU/RSV testing.  Fact Sheet for Patients: EntrepreneurPulse.com.au  Fact Sheet for Healthcare Providers: IncredibleEmployment.be  This test is not yet approved or cleared by the [place] FDA and has been authorized for detection and/or diagnosis of SARS-CoV-2 by FDA under an Emergency Use Authorization (EUA). This EUA will remain in effect (meaning this test can be used) for the duration of the COVID-19 declaration under Section 564(b)(1) of the Act, 21 U.S.C. section 360bbb-3(b)(1), unless the authorization is terminated or revoked.  Performed at [facility], [address] [place], [place] [address]      Radiological Exams on Admission: DG Chest Portable 1 View  Result Date: 06/02/2021 CLINICAL DATA:  Chest pain, shortness of breath EXAM: PORTABLE CHEST 1 VIEW COMPARISON:  04/19/2021 FINDINGS: Cardiomegaly status post median sternotomy. Mild pulmonary vascular prominence and diffuse bilateral interstitial opacity. The visualized skeletal structures are unremarkable.  IMPRESSION: Cardiomegaly with mild pulmonary vascular prominence and diffuse bilateral interstitial opacity, likely edema. No focal airspace opacity. Electronically Signed   By: Delanna Ahmadi M.D.   On: 06/02/2021 09:58     EKG: I have personally reviewed.  Atrial fibrillation, QTC 466, heart rate 142, LAD, T wave inversion in V4-V6.  Assessment/Plan Principal Problem:   Atrial fibrillation with RVR (HCC) Active Problems:   Bacteremia due to Enterococcus   Benign prostatic hyperplasia   Essential hypertension   Type 2 diabetes mellitus without complication, with long-term current use of insulin (HCC)   Hyperlipidemia LDL goal <70   Coronary artery disease   Severe sepsis (HCC)   Elevated troponin   Pancytopenia (HCC)   Cellulitis of scalp  Atrial fibrillation with RVR ([place]): Initial heart rate was up to 160s.  Patient is converted to, heart rate in the 80s, but still has softer blood pressure.  Dr. Rockey Situ of cardiology is consulted.  -Placed on progressive bed of observation -Amiodarone 400 mg twice daily -Hold Cardizem and metoprolol now due to softer blood pressure  Benign prostatic hyperplasia -Hold Flomax due to softer blood pressure  Essential hypertension -Hold all blood pressure medications, including Cardizem, metoprolol, amlodipine, lisinopril, imdur -also hold flomax  Type 2 diabetes mellitus without complication, with long-term current use of insulin ([place]): Recent A1c 9.5, poorly controlled.  Patient is taking Novolin and glipizide, 70/30 insulin at 100 unit twice daily -SSI -70/30 insulin 70 unit twice daily  Hyperlipidemia LDL goal <70 -Lipitor  Coronary artery disease -Aspirin, Lipitor -As needed nitroglycerin -Hold Imdur as above  Severe sepsis due to scalp cellulitis: Patient is on doxycycline currently.  He meets criteria for severe sepsis with tachycardia, tachypnea with RR 24, WBC 2.3.  Lactic acid 2.5. -Switch to oral doxycycline to vancomycin -F/u  Blood culture -will get Procalcitonin and trend lactic acid levels per sepsis protocol. -IVF: 500 of NS bolus in ED, followed by 75 cc/h (patient has vascular congestion on chest x-ray, limiting aggressive IV fluid treatment)  Elevated troponin and hx of CAD: s/p of CABG. troponin is minimally elevated, 32, 35.  Likely due to demand ischemia.  Currently no chest pain -Trend troponin -Aspirin, Lipitor -prn NTG -check A1c and FLP  Pancytopenia ([place]): No active bleeding. -Follow-up with CBC -Patient may need  to follow-up with hematologist after discharge     DVT ppx: on Eliquis Code Status: Full code Family Communication:  Yes, patient's daughter by phone Disposition Plan:  Anticipate discharge back to previous environment Consults called:  Dr. Rockey Situ of card Admission status and Level of care: Progressive:   for obs    Status is: Observation  The patient remains OBS appropriate and will d/c before 2 midnights.        Date of Service 06/02/2021    Ivor Costa [facility]   If 7PM-7AM, please contact night-coverage [email or web address] 06/02/2021, 5:59 PM

## 2021-06-02 NOTE — ED Notes (Signed)
Informed MD of BP and HR of pt. Stopped Diltiazem infusion.

## 2021-06-02 NOTE — Consult Note (Signed)
PHARMACY -  BRIEF ANTIBIOTIC NOTE   Pharmacy has received consult(s) for Vancomycin from an ED provider.  The patient's profile has been reviewed for ht/wt/allergies/indication/available labs.    One time order(s) placed for Vancomycin 2g IV x 1 dose.  Further antibiotics/pharmacy consults should be ordered by admitting physician if indicated.                       Thank you, Pearla Dubonnet 06/02/2021  11:20 AM

## 2021-06-02 NOTE — ED Provider Notes (Signed)
[facility] Emergency Department Provider Note  ____________________________________________   Event Date/Time   First MD Initiated Contact with Patient 06/02/21 [phone]     (approximate)  I have reviewed the triage vital signs and the nursing notes.   HISTORY  Chief Complaint Chest Pain    HPI Jonathan Roza. is a 75 y.o. male  with h/o CAD, h/o AV replacement, CABG, HTN, HLD, here with SOB. Pt reports that he just left the hospital following admission for MRSA scalp infection. He felt "better than he has in a while" yesterday and worked throughout the day. Felt OK initially upon waking up. However, shortly after while walking to the restroom, he developed acute, severe SOB along with palpitations. Reports he's had some associated nausea, fatigue. He feels weak, tired. Per EMS, pt had hR up to 160s with them, was given lopressor 5 mg IV with improvement in HR but drop in BP to 90s. Reports he feels somewhat better since receiving the meds. Mild lightheadedness noted. No focal numbness, weakness.        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   Coronary artery disease    Diabetes ([place])    H/O aortic valve replacement    History of kidney stones    Hx of CABG    Hyperlipidemia    Hypertension    Sleep apnea     Patient Active Problem List   Diagnosis Date Noted   Tinea corporis    Swelling of toe of left foot 11/02/2019   Aortic valve endocarditis    Coronary artery disease    Hypoalbuminemia 06/29/2019   Hyperglycemia 06/29/2019   Lactic acidosis 06/29/2019   GERD (gastroesophageal reflux disease) 06/29/2019   Staghorn renal calculus 06/29/2019   Nonrheumatic aortic valve stenosis 05/13/2019   Hyperlipidemia LDL goal <70 05/13/2019   Preop cardiovascular exam 05/13/2019   Bacteremia due to Enterococcus 04/18/2019   Class 2 severe obesity due to excess calories with serious comorbidity and body mass index  (BMI) of 38.0 to 38.9 in adult [facility]) 03/24/2018   History of aortic valve replacement with bioprosthetic valve 08/07/2016   Status post coronary artery bypass graft 08/07/2016   Coronary artery disease of native artery of native heart with stable angina pectoris ([place]) 02/11/2016   SVT (supraventricular tachycardia) ([place]) 07/31/2015   Type 2 diabetes mellitus without complication, with long-term current use of insulin ([place]) 03/28/2015   Benign prostatic hyperplasia 04/07/2014   OSA (obstructive sleep apnea) 04/07/2014   Essential hypertension 05/16/2009    Past Surgical History:  Procedure Laterality Date   AORTIC VALVE REPLACEMENT  2017   [facility], bioprosthetic   APPENDECTOMY     CARDIAC VALVE REPLACEMENT     CORONARY ARTERY BYPASS GRAFT  2017   [facility] - [facility]-LAD and SVG-OM   CYSTOSCOPY W/ RETROGRADES Right 05/27/2019   Procedure: CYSTOSCOPY WITH RETROGRADE PYELOGRAM;  Surgeon: Billey Co, MD;  Location: [facility] ORS;  Service: Urology;  Laterality: Right;   CYSTOSCOPY W/ RETROGRADES Right 07/11/2019   Procedure: CYSTOSCOPY WITH RETROGRADE PYELOGRAM;  Surgeon: Billey Co, MD;  Location: [facility] ORS;  Service: Urology;  Laterality: Right;   CYSTOSCOPY/URETEROSCOPY/HOLMIUM LASER/STENT PLACEMENT Right 05/27/2019   Procedure: CYSTOSCOPY/URETEROSCOPY/HOLMIUM LASER/STENT PLACEMENT;  Surgeon: Billey Co, MD;  Location: [facility] ORS;  Service: Urology;  Laterality: Right;   CYSTOSCOPY/URETEROSCOPY/HOLMIUM LASER/STENT PLACEMENT Right 06/17/2019   Procedure: CYSTOSCOPY/URETEROSCOPY/HOLMIUM LASER/STENT Exchange;  Surgeon: Billey Co, MD;  Location: [facility] ORS;  Service:  Urology;  Laterality: Right;   CYSTOSCOPY/URETEROSCOPY/HOLMIUM LASER/STENT PLACEMENT Right 07/11/2019   Procedure: CYSTOSCOPY/URETEROSCOPY/HOLMIUM LASER/STENT Exchange;  Surgeon: Billey Co, MD;  Location: [facility] ORS;  Service: Urology;  Laterality: Right;   STONE EXTRACTION WITH BASKET Right 07/11/2019   Procedure: STONE  EXTRACTION WITH BASKET;  Surgeon: Billey Co, MD;  Location: [facility] ORS;  Service: Urology;  Laterality: Right;   TEE WITHOUT CARDIOVERSION N/A 04/21/2019   Procedure: TRANSESOPHAGEAL ECHOCARDIOGRAM (TEE);  Surgeon: Minna Merritts, MD;  Location: [facility] ORS;  Service: Cardiovascular;  Laterality: N/A;   TEE WITHOUT CARDIOVERSION N/A 07/04/2019   Procedure: TRANSESOPHAGEAL ECHOCARDIOGRAM (TEE);  Surgeon: Wellington Hampshire, MD;  Location: [facility] OR[facility];  Service: Cardiovascular;  Laterality: N/A;   TEE WITHOUT CARDIOVERSION N/A 08/17/2019   Procedure: TRANSESOPHAGEAL ECHOCARDIOGRAM (TEE);  Surgeon: Kate Sable, MD;  Location: [facility] OR[facility];  Service: Cardiovascular;  Laterality: N/A;   TEE WITHOUT CARDIOVERSION N/A 11/15/2019   Procedure: TRANSESOPHAGEAL ECHOCARDIOGRAM (TEE);  Surgeon: Nelva Bush, MD;  Location: [facility] OR[facility];  Service: Cardiovascular;  Laterality: N/A;   TONSILLECTOMY      Prior to Admission medications   Medication Sig Start Date End Date Taking? Authorizing Provider  amLODipine (NORVASC) 5 MG tablet Take 5 mg by mouth daily. 03/08/19   [provider]  amoxicillin (AMOXIL) 500 MG capsule Take 1 capsule (500 mg total) by mouth in the morning and at bedtime. Patient not taking: Reported on 04/19/2021 11/08/19   Tsosie Billing, MD  Ascorbic Acid (VITAMIN C WITH ROSE HIPS) 500 MG tablet Take 1,000 mg by mouth daily.     [provider]  aspirin EC 81 MG tablet Take 81 mg by mouth daily.  05/30/14   [provider]  atorvastatin (LIPITOR) 80 MG tablet Take 80 mg by mouth every evening.  08/13/18   [provider]  AZO-CRANBERRY PO Take 1 tablet by mouth daily.     [provider]  calcium carbonate (OSCAL) 1500 (600 Ca) MG TABS tablet Take 1 tablet by mouth every morning.    [provider]  diltiazem (CARDIZEM CD) 240 MG 24 hr capsule Take 240 mg by mouth every morning. 02/01/21   [provider]  ELIQUIS 5 MG TABS  tablet Take 5 mg by mouth 2 (two) times daily. 04/18/21   [provider]  glipiZIDE (GLUCOTROL XL) 2.5 MG 24 hr tablet Take 2.5 mg by mouth daily with breakfast.  05/05/19   [provider]  insulin aspart (NOVOLOG) 100 UNIT/ML injection Inject 0-5 Units into the skin at bedtime. Patient not taking: Reported on 11/15/2019 04/23/19   Epifanio Lesches, MD  isosorbide mononitrate (IMDUR) 60 MG 24 hr tablet Take 1 tablet (60 mg total) by mouth daily. 05/12/19 11/15/19  End, Harrell Gave, MD  lisinopril (ZESTRIL) 20 MG tablet Take 1 tablet by mouth once daily 11/28/19   End, Harrell Gave, MD  loratadine (CLARITIN) 10 MG tablet Take 10 mg by mouth daily. 09/13/18 11/15/19  [provider]  metoprolol succinate (TOPROL-XL) 25 MG 24 hr tablet Take 2 tablets (50 mg total) by mouth daily. 03/02/21   Vanessa [place], MD  nitroGLYCERIN (NITROSTAT) 0.4 MG SL tablet Place 0.4 mg under the tongue every 5 (five) minutes x 3 doses as needed for chest pain.  03/24/18 11/15/19  [provider]  NOVOLIN 70/30 RELION (70-30) 100 UNIT/ML injection Inject 100 Units into the skin 2 (two) times daily.  05/13/19   [provider]  omeprazole (PRILOSEC) 20 MG capsule Take 20  mg by mouth every evening.  04/22/18 11/15/19  [provider]  ondansetron (ZOFRAN-ODT) 4 MG disintegrating tablet Take 4 mg by mouth every 8 (eight) hours as needed for nausea or vomiting.    [provider]  oxyCODONE-acetaminophen (PERCOCET) 5-325 MG tablet Take 1 tablet by mouth every 8 (eight) hours as needed. Patient taking differently: Take 1 tablet by mouth every 8 (eight) hours as needed (pain.).  10/19/19   Earleen [place], MD  Potassium Citrate 15 MEQ (1620 MG) TBCR Take 2 tablets by mouth 2 (two) times daily. Patient taking differently: Take 1 tablet by mouth 2 (two) times daily.  07/20/19   Billey Co, MD  tamsulosin (FLOMAX) 0.4 MG CAPS capsule Take 0.4 mg by mouth daily. 03/06/21    [provider]  terbinafine (LAMISIL AT) 1 % cream Apply 1 application topically 2 (two) times daily. 11/08/19   Tsosie Billing, MD    Allergies Metformin  Family History  Problem Relation Age of Onset   Heart attack Father 92    Social History Social History   Tobacco Use   Smoking status: Former   Smokeless tobacco: Never  Scientific laboratory technician Use: Never used  Substance Use Topics   Alcohol use: Never   Drug use: Never    Review of Systems  Review of Systems  Constitutional:  Positive for fatigue. Negative for chills and fever.  HENT:  Negative for sore throat.   Respiratory:  Positive for chest tightness and shortness of breath.   Cardiovascular:  Positive for chest pain and palpitations.  Gastrointestinal:  Negative for abdominal pain.  Genitourinary:  Negative for flank pain.  Musculoskeletal:  Negative for neck pain.  Skin:  Positive for rash. Negative for wound.  Allergic/Immunologic: Negative for immunocompromised state.  Neurological:  Positive for light-headedness. Negative for weakness and numbness.  Hematological:  Does not bruise/bleed easily.  All other systems reviewed and are negative.   ____________________________________________  PHYSICAL EXAM:      VITAL SIGNS: ED Triage Vitals  Enc Vitals Group     BP 06/02/21 0922 119/64     Pulse Rate 06/02/21 0922 (!) 127     Resp 06/02/21 0922 12     Temp 06/02/21 0922 (!) 97.5 F (36.4 C)     Temp Source 06/02/21 0922 Oral     SpO2 06/02/21 0922 98 %     Weight 06/02/21 0925 240 lb (108.9 kg)     Height 06/02/21 0925 5\' 9"  (1.753 m)     Head Circumference --      Peak Flow --      Pain Score 06/02/21 0923 2     Pain Loc --      Pain Edu? --      Excl. in [place]? --      Physical Exam Vitals and nursing note reviewed.  Constitutional:      General: He is not in acute distress.    Appearance: He is well-developed.  HENT:     Head: Normocephalic and atraumatic.     Comments:  Moderate erythema and induration noted to occipital scalp, worse on R>L, with no purulence or open wounds. Slight skin thickening noted. Eyes:     Conjunctiva/sclera: Conjunctivae normal.  Cardiovascular:     Rate and Rhythm: Tachycardia present. Rhythm irregularly irregular.     Heart sounds: Normal heart sounds. No murmur heard.   No friction rub.  Pulmonary:     Effort: Pulmonary effort is  normal. No respiratory distress.     Breath sounds: Normal breath sounds. No wheezing or rales.  Abdominal:     General: There is no distension.     Palpations: Abdomen is soft.     Tenderness: There is no abdominal tenderness.  Musculoskeletal:     Cervical back: Neck supple.  Skin:    General: Skin is warm.     Capillary Refill: Capillary refill takes less than 2 seconds.  Neurological:     Mental Status: He is alert and oriented to person, place, and time.     Motor: No abnormal muscle tone.      ____________________________________________   LABS (all labs ordered are listed, but only abnormal results are displayed)  Labs Reviewed  CBC WITH DIFFERENTIAL/PLATELET - Abnormal; Notable for the following components:      Result Value   WBC 2.3 (*)    RBC 2.85 (*)    Hemoglobin 10.5 (*)    HCT 32.0 (*)    MCV 112.3 (*)    MCH 36.8 (*)    RDW 16.5 (*)    Platelets 107 (*)    Neutro Abs 0.7 (*)    All other components within normal limits  COMPREHENSIVE METABOLIC PANEL - Abnormal; Notable for the following components:   Glucose, Bld 146 (*)    Calcium 8.7 (*)    Albumin 3.4 (*)    All other components within normal limits  LACTIC ACID, PLASMA - Abnormal; Notable for the following components:   Lactic Acid, Venous 2.5 (*)    All other components within normal limits  TROPONIN I (HIGH SENSITIVITY) - Abnormal; Notable for the following components:   Troponin I (High Sensitivity) 32 (*)    All other components within normal limits  CULTURE, BLOOD (SINGLE)  CULTURE, BLOOD (SINGLE)   BRAIN NATRIURETIC PEPTIDE  LACTIC ACID, PLASMA  TROPONIN I (HIGH SENSITIVITY)    ____________________________________________  EKG: Atrial fibrillation with rapid V rate, VR 142. QRS 89, QTc 466. Non-specific TW changes, likely rate related, worse in lateral precordial leads. ________________________________________  RADIOLOGY All imaging, including plain films, CT scans, and ultrasounds, independently reviewed by me, and interpretations confirmed via formal radiology reads.  ED MD interpretation:   CXR: Cardiomegalyw ith bilateral vascular prominence and edema  Official radiology report(s): DG Chest Portable 1 View  Result Date: 06/02/2021 CLINICAL DATA:  Chest pain, shortness of breath EXAM: PORTABLE CHEST 1 VIEW COMPARISON:  04/19/2021 FINDINGS: Cardiomegaly status post median sternotomy. Mild pulmonary vascular prominence and diffuse bilateral interstitial opacity. The visualized skeletal structures are unremarkable. IMPRESSION: Cardiomegaly with mild pulmonary vascular prominence and diffuse bilateral interstitial opacity, likely edema. No focal airspace opacity. Electronically Signed   By: Delanna Ahmadi M.D.   On: 06/02/2021 09:58    ____________________________________________  PROCEDURES   Procedure(s) performed (including Critical Care):  .Critical Care Performed by: Duffy Bruce, MD Authorized by: Duffy Bruce, MD   Critical care provider statement:    Critical care time (minutes):  30   Critical care time was exclusive of:  Separately billable procedures and treating other patients   Critical care was necessary to treat or prevent imminent or life-threatening deterioration of the following conditions:  Circulatory failure, cardiac failure, respiratory failure and sepsis   Critical care was time spent personally by me on the following activities:  Development of treatment plan with patient or surrogate, discussions with consultants, evaluation of patient's  response to treatment, examination of patient, ordering and review of laboratory studies, ordering  and review of radiographic studies, ordering and performing treatments and interventions, pulse oximetry, re-evaluation of patient's condition and review of old charts  ____________________________________________  INITIAL IMPRESSION / MDM / [place] / ED COURSE  As part of my medical decision making, I reviewed the following data within the [place] notes reviewed and incorporated, Old chart reviewed, Notes from prior ED visits, and [place] Controlled Substance Database       *Jonathan Cross. was evaluated in Emergency Department on 06/02/2021 for the symptoms described in the history of present illness. He was evaluated in the context of the global COVID-19 pandemic, which necessitated consideration that the patient might be at risk for infection with the SARS-CoV-2 virus that causes COVID-19. Institutional protocols and algorithms that pertain to the evaluation of patients at risk for COVID-19 are in a state of rapid change based on information released by regulatory bodies including the CDC and federal and state organizations. These policies and algorithms were followed during the patient's care in the ED.  Some ED evaluations and interventions may be delayed as a result of limited staffing during the pandemic.*     Medical Decision Making:  75 yo M here with weakness. On arrival, pt in AFib RVR with mild hypotension, mild resp distress. Satting well on RA, however. Exam shows what he reports is a resolving scalp cellulitis. Suspect acute AFib RVR in setting of infection, recent hospitalization. Pt given dilt bolus and gtt but had BP drop to 80s. He is anticoagulated. Will discuss with Cardiology re: rate vs rhythm management.  Pt spontaneously cardioverted after receiving calcium given for hypotension after briefly on dilt gtt and 10 mg IV bolus. Dr. Rockey Situ  had been consulted prior to and will see pt given his difficulty tolerating rate control. Otherwise, pt has mild lactic acidosis, leukopenia, and exam c/f ongoing scalp cellulitis with h/o MRSA and BE. Will admit for IV ABX, tele monitoring.   ____________________________________________  FINAL CLINICAL IMPRESSION(S) / ED DIAGNOSES  Final diagnoses:  Cellulitis of head or scalp  Atrial fibrillation with rapid ventricular response (HCC)     MEDICATIONS GIVEN DURING THIS VISIT:  Medications  diltiazem (CARDIZEM) 1 mg/mL load via infusion 10 mg (10 mg Intravenous Bolus from Bag 06/02/21 0944)    And  diltiazem (CARDIZEM) 125 mg in dextrose 5% 125 mL (1 mg/mL) infusion (0 mg/hr Intravenous Paused 06/02/21 1009)  diltiazem (CARDIZEM) 25 MG/5ML injection (  Not Given 06/02/21 0946)  vancomycin (VANCOREADY) IVPB 2000 mg/400 mL (has no administration in time range)  sodium chloride 0.9 % bolus 500 mL (0 mLs Intravenous Stopped 06/02/21 1031)  calcium chloride injection 1 g (1 g Intravenous Given 06/02/21 1024)     ED Discharge Orders     None        Note:  This document was prepared using Dragon voice recognition software and may include unintentional dictation errors.   Duffy Bruce, MD 06/02/21 1126

## 2021-06-03 ENCOUNTER — Observation Stay (HOSPITAL_BASED_OUTPATIENT_CLINIC_OR_DEPARTMENT_OTHER)
Admit: 2021-06-03 | Discharge: 2021-06-03 | Disposition: A | Discharge status: Home / Self Care | Payer: Medicare HMO | Attending: Medical | Admitting: Medical

## 2021-06-03 DIAGNOSIS — I4891 Unspecified atrial fibrillation: Secondary | ICD-10-CM | POA: Diagnosis not present

## 2021-06-03 LAB — GLUCOSE, CAPILLARY
Glucose-Capillary: 160 mg/dL — ABNORMAL HIGH (ref 70–99)
Glucose-Capillary: 182 mg/dL — ABNORMAL HIGH (ref 70–99)
Glucose-Capillary: 252 mg/dL — ABNORMAL HIGH (ref 70–99)
Glucose-Capillary: 170 mg/dL — ABNORMAL HIGH (ref 70–99)

## 2021-06-03 LAB — LIPID PANEL
Cholesterol: 92 mg/dL (ref 0–200)
HDL: 16 mg/dL — ABNORMAL LOW (ref >40)
LDL Cholesterol: 49 mg/dL (ref 0–99)
Total CHOL/HDL Ratio: 5.8 RATIO
Triglycerides: 134 mg/dL (ref <150)
VLDL: 27 mg/dL (ref 0–40)

## 2021-06-03 LAB — ECHOCARDIOGRAM LIMITED
Area-P 1/2: 4.6 cm2
Height: 69 in
S' Lateral: 2.84 cm
Weight: 3840 oz

## 2021-06-03 LAB — CBC
HCT: 28.2 % — ABNORMAL LOW (ref 39.0–52.0)
Hemoglobin: 9.3 g/dL — ABNORMAL LOW (ref 13.0–17.0)
MCH: 37.2 pg — ABNORMAL HIGH (ref 26.0–34.0)
MCHC: 33 g/dL (ref 30.0–36.0)
MCV: 112.8 fL — ABNORMAL HIGH (ref 80.0–100.0)
Platelets: 100 10*3/uL — ABNORMAL LOW (ref 150–400)
RBC: 2.5 MIL/uL — ABNORMAL LOW (ref 4.22–5.81)
RDW: 17 % — ABNORMAL HIGH (ref 11.5–15.5)
WBC: 2.8 10*3/uL — ABNORMAL LOW (ref 4.0–10.5)
nRBC: 0 % (ref 0.0–0.2)

## 2021-06-03 LAB — C-REACTIVE PROTEIN: CRP: 0.8 mg/dL (ref <1.0)

## 2021-06-03 MED ORDER — CALCIUM CARBONATE ANTACID 500 MG PO CHEW
1.0000 | CHEWABLE_TABLET | Freq: Two times a day (BID) | ORAL | Status: DC | PRN
  Filled 2021-06-03: qty 1

## 2021-06-03 MED ORDER — TAMSULOSIN HCL 0.4 MG PO CAPS
0.4000 mg | ORAL_CAPSULE | Freq: Every day | ORAL | Status: DC
  Administered 2021-06-03 – 2021-06-04 (×2): 0.4 mg via ORAL
  Filled 2021-06-03 (×2): qty 1

## 2021-06-03 MED ORDER — DOXYCYCLINE HYCLATE 100 MG PO TABS
100.0000 mg | ORAL_TABLET | Freq: Two times a day (BID) | ORAL | Status: DC
  Administered 2021-06-04: 100 mg via ORAL
  Filled 2021-06-03: qty 1

## 2021-06-03 NOTE — Care Management Obs Status (Signed)
[place] NOTIFICATION   Patient Details  Name: Jonathan Cross. MRN: [id number] Date of Birth: 10-Aug-1945   Medicare Observation Status Notification Given:  Yes    Alberteen Sam, LCSW 06/03/2021, 3:42 PM

## 2021-06-03 NOTE — TOC Initial Note (Signed)
Transition of Care (TOC) - Initial/Assessment Note    Patient Details  Name: Jonathan Cross. MRN: [id number] Date of Birth: Oct 01, 1945  Transition of Care [facility]) CM/SW Contact:    Alberteen Sam, LCSW Phone Number: 06/03/2021, 3:50 PM  Clinical Narrative:                  CSW spoke with patient's son [name] to provide medicare obs status. [name] also reports that patient was at [facility] then discharge home with home health recently. He reports he is unaware of the agency name for home health but would like to renew services.   Per [facility], patient is active with [facility] .   CSW spoke with [facility], they report patient is active with RN and PT home health services. Will resume at dc, updated orders requested from MD.    Expected Discharge Plan: [place] Barriers to Discharge: No Barriers Identified   Patient Goals and CMS Choice Patient states their goals for this hospitalization and ongoing recovery are:: to go home CMS Medicare.gov Compare Post Acute Care list provided to:: Patient Choice offered to / list presented to : Patient  Expected Discharge Plan and Services Expected Discharge Plan: [place] Acute Care Choice: [place] arrangements for the past 2 months: [place]                                      Prior Living Arrangements/Services Living arrangements for the past 2 months: Single Family Home Lives with:: Self                   Activities of Daily Living      Permission Sought/Granted                  Emotional Assessment         Alcohol / Substance Use: Not Applicable Psych Involvement: No (comment)  Admission diagnosis:  Atrial fibrillation with rapid ventricular response ([place]) [I48.91] Cellulitis of head or scalp [L03.811] Atrial fibrillation with RVR ([place]) [I48.91] Infection of scalp [L08.9] Patient Active Problem List   Diagnosis  Date Noted   Atrial fibrillation with RVR ([place]) 06/02/2021   Severe sepsis ([place]) 06/02/2021   Infection of scalp 06/02/2021   Elevated troponin 06/02/2021   Pancytopenia ([place]) 06/02/2021   Cellulitis of scalp 06/02/2021   Tinea corporis    Swelling of toe of left foot 11/02/2019   Aortic valve endocarditis    Coronary artery disease    Hypoalbuminemia 06/29/2019   Hyperglycemia 06/29/2019   Lactic acidosis 06/29/2019   GERD (gastroesophageal reflux disease) 06/29/2019   Staghorn renal calculus 06/29/2019   Nonrheumatic aortic valve stenosis 05/13/2019   Hyperlipidemia LDL goal <70 05/13/2019   Preop cardiovascular exam 05/13/2019   Bacteremia due to Enterococcus 04/18/2019   Class 2 severe obesity due to excess calories with serious comorbidity and body mass index (BMI) of 38.0 to 38.9 in adult [facility]) 03/24/2018   History of aortic valve replacement with bioprosthetic valve 08/07/2016   Status post coronary artery bypass graft 08/07/2016   Coronary artery disease of native artery of native heart with stable angina pectoris ([place]) 02/11/2016   SVT (supraventricular tachycardia) ([place]) 07/31/2015   Type 2 diabetes mellitus without complication, with long-term current use of insulin ([place]) 03/28/2015   Benign prostatic hyperplasia  04/07/2014   OSA (obstructive sleep apnea) 04/07/2014   Essential hypertension 05/16/2009   PCP:  Georges Mouse, MD Pharmacy:   [facility] [address]), [place] [address] [place] ([place])  [address] Phone: [phone] Fax: [place], [place] - [place] AT [facility] [place] [address] Phone: [phone] Fax: [phone]     Social Determinants of Health ([place]) Interventions    Readmission Risk Interventions Readmission Risk Prevention Plan 11/17/2019 08/16/2019 06/30/2019  Transportation Screening Complete Complete Complete   PCP or Specialist Appt within 3-5 Days - Complete -  HRI or Home Care Consult Complete - Patient refused  Social Work Consult for [place] Planning/Counseling Complete - Complete  Palliative Care Screening Not Applicable Not Applicable Not Applicable  Medication Review (RN Care Manager) Complete Complete Complete  Some recent data might be hidden

## 2021-06-03 NOTE — ED Notes (Signed)
ED TO INPATIENT HANDOFF REPORT  ED Nurse Name and Phone #: Darnelle Maffucci [phone]  S Name/Age/Gender Jonathan Cross. 75 y.o. male Room/Bed: ED08A/ED08A  Code Status   Code Status: Full Code  Home/SNF/Other Home Patient oriented to: self, place and time Is this baseline? Yes   Triage Complete: Triage complete  Chief Complaint Atrial fibrillation with RVR ([place]) [I48.91] Infection of scalp [L08.9]  Triage Note Pt arrived via [facility]. Per EMS, pt states that they have been having chest pain waking this morning. EMS gave pt 5mg  of metoprolol. Pt state the pain is in the center of their chest. Pt has a hx of Afib and SVT. Pt states they were hospitalized for an infection 2 weeks ago and is currently on antibiotics.    Allergies Allergies  Allergen Reactions   Metformin Diarrhea    Loose stools even with XR    Level of Care/Admitting Diagnosis ED Disposition     ED Disposition  Admit   Condition  --   [place]: [place] [[id number]]  Level of Care: Progressive [102]  Admit to Progressive based on following criteria: CARDIOVASCULAR & THORACIC of moderate stability with acute coronary syndrome symptoms/low risk myocardial infarction/hypertensive urgency/arrhythmias/heart failure potentially compromising stability and stable post cardiovascular intervention patients.  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   Coronary artery disease    Diabetes ([place])    H/O aortic valve replacement    History of kidney stones    Hx of CABG    Hyperlipidemia    Hypertension    Sleep apnea    Past Surgical History:  Procedure Laterality Date   AORTIC VALVE REPLACEMENT  2017   [facility],  bioprosthetic   APPENDECTOMY     CARDIAC VALVE REPLACEMENT     CORONARY ARTERY BYPASS GRAFT  2017   [facility] - [facility]A-LAD and SVG-OM   CYSTOSCOPY W/ RETROGRADES Right 05/27/2019   Procedure: CYSTOSCOPY WITH RETROGRADE PYELOGRAM;  Surgeon: Billey Co, MD;  Location: [facility];  Service: Urology;  Laterality: Right;   CYSTOSCOPY W/ RETROGRADES Right 07/11/2019   Procedure: CYSTOSCOPY WITH RETROGRADE PYELOGRAM;  Surgeon: Billey Co, MD;  Location: [facility];  Service: Urology;  Laterality: Right;   CYSTOSCOPY/URETEROSCOPY/HOLMIUM LASER/STENT PLACEMENT Right 05/27/2019   Procedure: CYSTOSCOPY/URETEROSCOPY/HOLMIUM LASER/STENT PLACEMENT;  Surgeon: Billey Co, MD;  Location: [facility];  Service: Urology;  Laterality: Right;   CYSTOSCOPY/URETEROSCOPY/HOLMIUM LASER/STENT PLACEMENT Right 06/17/2019   Procedure: CYSTOSCOPY/URETEROSCOPY/HOLMIUM LASER/STENT Exchange;  Surgeon: Billey Co, MD;  Location: [facility];  Service: Urology;  Laterality: Right;   CYSTOSCOPY/URETEROSCOPY/HOLMIUM LASER/STENT PLACEMENT Right 07/11/2019   Procedure: CYSTOSCOPY/URETEROSCOPY/HOLMIUM LASER/STENT Exchange;  Surgeon: Billey Co, MD;  Location: [facility];  Service: Urology;  Laterality: Right;   STONE EXTRACTION WITH BASKET Right 07/11/2019   Procedure: STONE EXTRACTION WITH BASKET;  Surgeon: Billey Co, MD;  Location: [facility];  Service: Urology;  Laterality: Right;   TEE WITHOUT CARDIOVERSION N/A 04/21/2019   Procedure: TRANSESOPHAGEAL ECHOCARDIOGRAM (TEE);  Surgeon: Minna Merritts, MD;  Location: [facility];  Service: Cardiovascular;  Laterality: N/A;   TEE WITHOUT CARDIOVERSION N/A 07/04/2019   Procedure: TRANSESOPHAGEAL ECHOCARDIOGRAM (TEE);  Surgeon: Wellington Hampshire, MD;  Location: [facility];  Service: Cardiovascular;  Laterality: N/A;  TEE WITHOUT CARDIOVERSION N/A 08/17/2019   Procedure: TRANSESOPHAGEAL ECHOCARDIOGRAM (TEE);  Surgeon: Kate Sable, MD;  Location: [facility];  Service:  Cardiovascular;  Laterality: N/A;   TEE WITHOUT CARDIOVERSION N/A 11/15/2019   Procedure: TRANSESOPHAGEAL ECHOCARDIOGRAM (TEE);  Surgeon: Nelva Bush, MD;  Location: [facility];  Service: Cardiovascular;  Laterality: N/A;   TONSILLECTOMY       A IV Location/Drains/Wounds Patient Lines/Drains/Airways Status     Active Line/Drains/Airways     Name Placement date Placement time Site Days   Peripheral IV 06/02/21 20 G Left Antecubital 06/02/21  0937  Antecubital  1   Peripheral IV 06/02/21 20 G Posterior;Right Hand 06/02/21  0938  Hand  1   PICC Double Lumen 11/94/17 PICC Right Basilic 42 cm 0 cm 40/81/44  1625  -- 655   PICC Double Lumen 09/08/19 PICC Right Cephalic 42 cm 0 cm 81/85/63  0838  -- 634   Ureteral Drain/Stent Right ureter 6 Fr. 07/11/19  1208  Right ureter  693            Intake/Output Last 24 hours  Intake/Output Summary (Last 24 hours) at 06/03/2021 0738 Last data filed at 06/02/2021 1439 Gross per 24 hour  Intake 900 ml  Output 175 ml  Net 725 ml    Labs/Imaging Results for orders placed or performed during the hospital encounter of 06/02/21 (from the past 48 hour(s))  CBC with Differential     Status: Abnormal   Collection Time: 06/02/21  9:26 AM  Result Value Ref Range   WBC 2.3 (L) 4.0 - 10.5 K/uL   RBC 2.85 (L) 4.22 - 5.81 MIL/uL   Hemoglobin 10.5 (L) 13.0 - 17.0 g/dL   HCT 32.0 (L) 39.0 - 52.0 %   MCV 112.3 (H) 80.0 - 100.0 fL   MCH 36.8 (H) 26.0 - 34.0 pg   MCHC 32.8 30.0 - 36.0 g/dL   RDW 16.5 (H) 11.5 - 15.5 %   Platelets 107 (L) 150 - 400 K/uL    Comment: Immature Platelet Fraction may be clinically indicated, consider ordering this additional test [id number]    nRBC 0.0 0.0 - 0.2 %   Neutrophils Relative % 30 %   Neutro Abs 0.7 (L) 1.7 - 7.7 K/uL   Lymphocytes Relative 53 %   Lymphs Abs 1.3 0.7 - 4.0 K/uL   Monocytes Relative 13 %   Monocytes Absolute 0.3 0.1 - 1.0 K/uL   Eosinophils Relative 3 %   Eosinophils Absolute 0.1 0.0 - 0.5  K/uL   Basophils Relative 1 %   Basophils Absolute 0.0 0.0 - 0.1 K/uL   WBC Morphology MORPHOLOGY UNREMARKABLE    RBC Morphology MORPHOLOGY UNREMARKABLE    Smear Review MORPHOLOGY UNREMARKABLE    Immature Granulocytes 0 %   Abs Immature Granulocytes 0.01 0.00 - 0.07 K/uL   Polychromasia PRESENT     Comment: Performed at [facility], [address] [place], [place] [address]  Comprehensive metabolic panel     Status: Abnormal   Collection Time: 06/02/21  9:26 AM  Result Value Ref Range   Sodium 136 135 - 145 mmol/L   Potassium 3.6 3.5 - 5.1 mmol/L   Chloride 104 98 - 111 mmol/L   CO2 25 22 - 32 mmol/L   Glucose, Bld 146 (H) 70 - 99 mg/dL    Comment: Glucose reference range applies only to samples taken after fasting for at least 8 hours.   BUN 13 8 - 23 mg/dL   Creatinine,  Ser 0.91 0.61 - 1.24 mg/dL   Calcium 8.7 (L) 8.9 - 10.3 mg/dL   Total Protein 7.4 6.5 - 8.1 g/dL   Albumin 3.4 (L) 3.5 - 5.0 g/dL   AST 33 15 - 41 U/L   ALT 40 0 - 44 U/L   Alkaline Phosphatase 56 38 - 126 U/L   Total Bilirubin 0.7 0.3 - 1.2 mg/dL   GFR, Estimated >60 >60 mL/min    Comment: (NOTE) Calculated using the CKD-EPI Creatinine Equation (2021)    Anion gap 7 5 - 15    Comment: Performed at [facility], [place], [place] [address]  Troponin I (High Sensitivity)     Status: Abnormal   Collection Time: 06/02/21  9:26 AM  Result Value Ref Range   Troponin I (High Sensitivity) 32 (H) <18 ng/L    Comment: (NOTE) Elevated high sensitivity troponin I (hsTnI) values and significant  changes across serial measurements may suggest ACS but many other  chronic and acute conditions are known to elevate hsTnI results.  Refer to the "Links" section for chest pain algorithms and additional  guidance. Performed at [facility], [address] [place], [place] [address]   Lactic acid, plasma     Status: Abnormal   Collection Time: 06/02/21  9:27 AM  Result Value  Ref Range   Lactic Acid, Venous 2.5 (HH) 0.5 - 1.9 mmol/L    Comment: CRITICAL RESULT CALLED TO, READ BACK BY AND VERIFIED WITH ANTACIA BELL AT 1007 06/02/21.PMF Performed at [facility], [address] [place], [place] [address]   Brain natriuretic peptide     Status: None   Collection Time: 06/02/21  9:27 AM  Result Value Ref Range   B Natriuretic Peptide 88.9 0.0 - 100.0 pg/mL    Comment: Performed at [facility], [address] [place], [place] [address]  Lactic acid, plasma     Status: None   Collection Time: 06/02/21 12:10 PM  Result Value Ref Range   Lactic Acid, Venous 1.9 0.5 - 1.9 mmol/L    Comment: Performed at [facility], [address], [place], [place] [address]  Resp Panel by RT-PCR (Flu A&B, Covid) Nasopharyngeal Swab     Status: None   Collection Time: 06/02/21 12:10 PM   Specimen: Nasopharyngeal Swab; Nasopharyngeal(NP) swabs in vial transport medium  Result Value Ref Range   SARS Coronavirus 2 by RT PCR NEGATIVE NEGATIVE    Comment: (NOTE) SARS-CoV-2 target nucleic acids are NOT DETECTED.  The SARS-CoV-2 RNA is generally detectable in upper respiratory specimens during the acute phase of infection. The lowest concentration of SARS-CoV-2 viral copies this assay can detect is 138 copies/mL. A negative result does not preclude SARS-Cov-2 infection and should not be used as the sole basis for treatment or other patient management decisions. A negative result may occur with  improper specimen collection/handling, submission of specimen other than nasopharyngeal swab, presence of viral mutation(s) within the areas targeted by this assay, and inadequate number of viral copies(<138 copies/mL). A negative result must be combined with clinical observations, patient history, and epidemiological information. The expected result is Negative.  Fact Sheet for Patients:  EntrepreneurPulse.com.au  Fact Sheet for Healthcare  Providers:  IncredibleEmployment.be  This test is no t yet approved or cleared by the [place] FDA and  has been authorized for detection and/or diagnosis of SARS-CoV-2 by FDA under an Emergency Use Authorization (EUA). This EUA will remain  in effect (meaning  this test can be used) for the duration of the COVID-19 declaration under Section 564(b)(1) of the Act, 21 U.S.C.section 360bbb-3(b)(1), unless the authorization is terminated  or revoked sooner.       Influenza A by PCR NEGATIVE NEGATIVE   Influenza B by PCR NEGATIVE NEGATIVE    Comment: (NOTE) The Xpert Xpress SARS-CoV-2/FLU/RSV plus assay is intended as an aid in the diagnosis of influenza from Nasopharyngeal swab specimens and should not be used as a sole basis for treatment. Nasal washings and aspirates are unacceptable for Xpert Xpress SARS-CoV-2/FLU/RSV testing.  Fact Sheet for Patients: EntrepreneurPulse.com.au  Fact Sheet for Healthcare Providers: IncredibleEmployment.be  This test is not yet approved or cleared by the [place] FDA and has been authorized for detection and/or diagnosis of SARS-CoV-2 by FDA under an Emergency Use Authorization (EUA). This EUA will remain in effect (meaning this test can be used) for the duration of the COVID-19 declaration under Section 564(b)(1) of the Act, 21 U.S.C. section 360bbb-3(b)(1), unless the authorization is terminated or revoked.  Performed at [facility], [place], [place] [address]   Troponin I (High Sensitivity)     Status: Abnormal   Collection Time: 06/02/21 12:10 PM  Result Value Ref Range   Troponin I (High Sensitivity) 35 (H) <18 ng/L    Comment: (NOTE) Elevated high sensitivity troponin I (hsTnI) values and significant  changes across serial measurements may suggest ACS but many other  chronic and acute conditions are known to elevate hsTnI results.  Refer to the  "Links" section for chest pain algorithms and additional  guidance. Performed at [facility], [address] [place], [place] [address]   Procalcitonin     Status: None   Collection Time: 06/02/21 12:10 PM  Result Value Ref Range   Procalcitonin <0.10 ng/mL    Comment:        Interpretation: PCT (Procalcitonin) <= 0.5 ng/mL: Systemic infection (sepsis) is not likely. Local bacterial infection is possible. (NOTE)       Sepsis PCT Algorithm           Lower Respiratory Tract                                      Infection PCT Algorithm    ----------------------------     ----------------------------         PCT < 0.25 ng/mL                PCT < 0.10 ng/mL          Strongly encourage             Strongly discourage   discontinuation of antibiotics    initiation of antibiotics    ----------------------------     -----------------------------       PCT 0.25 - 0.50 ng/mL            PCT 0.10 - 0.25 ng/mL               OR       >80% decrease in PCT            Discourage initiation of                                            antibiotics  Encourage discontinuation           of antibiotics    ----------------------------     -----------------------------         PCT >= 0.50 ng/mL              PCT 0.26 - 0.50 ng/mL               AND        <80% decrease in PCT             Encourage initiation of                                             antibiotics       Encourage continuation           of antibiotics    ----------------------------     -----------------------------        PCT >= 0.50 ng/mL                  PCT > 0.50 ng/mL               AND         increase in PCT                  Strongly encourage                                      initiation of antibiotics    Strongly encourage escalation           of antibiotics                                     -----------------------------                                           PCT <= 0.25 ng/mL                                                  OR                                        > 80% decrease in PCT                                      Discontinue / Do not initiate                                             antibiotics  Performed at [facility], [address] [place], [place] [address]   C-reactive protein     Status: None   Collection Time: 06/02/21  4:00 PM  Result  Value Ref Range   CRP 0.8 <1.0 mg/dL    Comment: Performed at [facility] [address], [place], [place] [address]  Hemoglobin A1c     Status: Abnormal   Collection Time: 06/02/21  4:00 PM  Result Value Ref Range   Hgb A1c MFr Bld 8.4 (H) 4.8 - 5.6 %    Comment: (NOTE) Pre diabetes:          5.7%-6.4%  Diabetes:              >6.4%  Glycemic control for   <7.0% adults with diabetes    Mean Plasma Glucose 194.38 mg/dL    Comment: Performed at [facility] [address] [place], [place] [address]  Sedimentation rate     Status: Abnormal   Collection Time: 06/02/21  4:00 PM  Result Value Ref Range   Sed Rate 56 (H) 0 - 20 mm/hr    Comment: Performed at [facility], [address] [place], [place] [address]  CBG monitoring, ED     Status: Abnormal   Collection Time: 06/02/21  5:43 PM  Result Value Ref Range   Glucose-Capillary 177 (H) 70 - 99 mg/dL    Comment: Glucose reference range applies only to samples taken after fasting for at least 8 hours.  CBG monitoring, ED     Status: Abnormal   Collection Time: 06/02/21  8:28 PM  Result Value Ref Range   Glucose-Capillary 256 (H) 70 - 99 mg/dL    Comment: Glucose reference range applies only to samples taken after fasting for at least 8 hours.  Lipid panel     Status: Abnormal   Collection Time: 06/03/21  4:29 AM  Result Value Ref Range   Cholesterol 92 0 - 200 mg/dL   Triglycerides 134 <150 mg/dL   HDL 16 (L) >40 mg/dL   Total CHOL/HDL Ratio 5.8 RATIO   VLDL 27 0 - 40 mg/dL   LDL Cholesterol 49 0 - 99 mg/dL    Comment:         Total Cholesterol/HDL:CHD Risk Coronary Heart Disease Risk Table                     Men   Women  1/2 Average Risk   3.4   3.3  Average Risk       5.0   4.4  2 X Average Risk   9.6   7.1  3 X Average Risk  23.4   11.0        Use the calculated Patient Ratio above and the CHD Risk Table to determine the patient's CHD Risk.        ATP III CLASSIFICATION (LDL):  <100     mg/dL   Optimal  100-129  mg/dL   Near or Above                    Optimal  130-159  mg/dL   Borderline  160-189  mg/dL   High  >190     mg/dL   Very High Performed at [facility], [address] [place], [place] [address]   CBC     Status: Abnormal   Collection Time: 06/03/21  4:29 AM  Result Value Ref Range   WBC 2.8 (L) 4.0 - 10.5 K/uL   RBC 2.50 (L) 4.22 - 5.81 MIL/uL   Hemoglobin 9.3 (L) 13.0 - 17.0 g/dL   HCT 28.2 (L) 39.0 -  52.0 %   MCV 112.8 (H) 80.0 - 100.0 fL   MCH 37.2 (H) 26.0 - 34.0 pg   MCHC 33.0 30.0 - 36.0 g/dL   RDW 17.0 (H) 11.5 - 15.5 %   Platelets 100 (L) 150 - 400 K/uL    Comment: Immature Platelet Fraction may be clinically indicated, consider ordering this additional test [id number]    nRBC 0.0 0.0 - 0.2 %    Comment: Performed at [facility], [address] [place], [place] [address]   DG Chest Portable 1 View  Result Date: 06/02/2021 CLINICAL DATA:  Chest pain, shortness of breath EXAM: PORTABLE CHEST 1 VIEW COMPARISON:  04/19/2021 FINDINGS: Cardiomegaly status post median sternotomy. Mild pulmonary vascular prominence and diffuse bilateral interstitial opacity. The visualized skeletal structures are unremarkable. IMPRESSION: Cardiomegaly with mild pulmonary vascular prominence and diffuse bilateral interstitial opacity, likely edema. No focal airspace opacity. Electronically Signed   By: Delanna Ahmadi M.D.   On: 06/02/2021 09:58    Pending Labs Unresulted Labs (From admission, onward)     Start     Ordered   06/02/21 1140  CULTURE, BLOOD (ROUTINE X 2) w  Reflex to ID Panel  BLOOD CULTURE X 2,   STAT      06/02/21 1139   06/02/21 0928  Blood culture (single)  ONCE - STAT,   STAT        06/02/21 0927            Vitals/Pain Today's Vitals   06/03/21 0425 06/03/21 0525 06/03/21 0600 06/03/21 0735  BP: (!) 115/56 (!) 114/48 (!) 109/48 129/62  Pulse: 89 85 97 99  Resp: 12 17 15  (!) 22  Temp:    97.9 F (36.6 C)  TempSrc:    Oral  SpO2: 95% 91% 95% 97%  Weight:      Height:      PainSc:    0-No pain    Isolation Precautions No active isolations  Medications Medications  diltiazem (CARDIZEM) 25 MG/5ML injection (  Not Given 06/02/21 0946)  amiodarone (PACERONE) tablet 400 mg (400 mg Oral Given 06/03/21 0005)  ondansetron (ZOFRAN) injection 4 mg (has no administration in time range)  acetaminophen (TYLENOL) tablet 650 mg (has no administration in time range)  insulin aspart (novoLOG) injection 0-9 Units (5 Units Subcutaneous Given 06/02/21 2039)  insulin aspart (novoLOG) injection 0-5 Units (0 Units Subcutaneous Hold 06/02/21 2125)  0.9 %  sodium chloride infusion ( Intravenous New Bag/Given 06/02/21 1400)  albuterol (PROVENTIL) (2.5 MG/3ML) 0.083% nebulizer solution 2.5 mg (has no administration in time range)  aspirin EC tablet 81 mg (81 mg Oral Given 06/02/21 1358)  oxyCODONE-acetaminophen (PERCOCET/ROXICET) 5-325 MG per tablet 1 tablet (has no administration in time range)  atorvastatin (LIPITOR) tablet 80 mg (80 mg Oral Given 06/02/21 2033)  nitroGLYCERIN (NITROSTAT) SL tablet 0.4 mg (has no administration in time range)  insulin aspart protamine- aspart (NOVOLOG MIX 70/30) injection 70 Units (70 Units Subcutaneous Given 06/02/21 2039)  pantoprazole (PROTONIX) EC tablet 40 mg (40 mg Oral Given 06/02/21 1358)  apixaban (ELIQUIS) tablet 5 mg (5 mg Oral Given 06/03/21 0005)  loratadine (CLARITIN) tablet 10 mg (10 mg Oral Given 06/02/21 1359)  vancomycin (VANCOREADY) IVPB 1750 mg/350 mL (has no administration in time range)   metoprolol succinate (TOPROL-XL) 24 hr tablet 25 mg (0 mg Oral Hold 06/02/21 2125)  diltiazem (CARDIZEM) 1 mg/mL load via infusion 10 mg (10 mg Intravenous Not Given 06/02/21 1843)  sodium chloride 0.9 %  bolus 500 mL (0 mLs Intravenous Stopped 06/02/21 1031)  calcium chloride injection 1 g (1 g Intravenous Given 06/02/21 1024)  vancomycin (VANCOREADY) IVPB 2000 mg/400 mL (0 mg Intravenous Stopped 06/02/21 1439)    Mobility walks Moderate fall risk      R Recommendations: See Admitting Provider Note  Report given to:   Additional Notes:  pt currently in sinus rythm

## 2021-06-03 NOTE — Plan of Care (Signed)
  Problem: Education: Goal: Knowledge of General Education information will improve Description: Including pain rating scale, medication(s)/side effects and non-pharmacologic comfort measures Outcome: Progressing   Problem: Health Behavior/Discharge Planning: Goal: Ability to manage health-related needs will improve Outcome: Progressing   Problem: Clinical Measurements: Goal: Respiratory complications will improve Outcome: Progressing   

## 2021-06-03 NOTE — Progress Notes (Addendum)
[place] [facility] [place] NAME: Kedarius Aloisi    MR#:  [id number]  DATE OF BIRTH:  [birth year]  SUBJECTIVE:   patient came in with palpitation and some chest discomfort. He was found to be in rapid heart rate with a fib. Patient was started on amiodarone by cardiology. Heart rate much stabilize back in sinus rhythm No fever REVIEW OF SYSTEMS:   Review of Systems  Constitutional:  Negative for chills, fever and weight loss.  HENT:  Negative for ear discharge, ear pain and nosebleeds.   Eyes:  Negative for blurred vision, pain and discharge.  Respiratory:  Negative for sputum production, shortness of breath, wheezing and stridor.   Cardiovascular:  Negative for chest pain, palpitations, orthopnea and PND.  Gastrointestinal:  Positive for heartburn. Negative for abdominal pain, diarrhea, nausea and vomiting.  Genitourinary:  Negative for frequency and urgency.  Musculoskeletal:  Negative for back pain and joint pain.  Neurological:  Positive for weakness. Negative for sensory change, speech change and focal weakness.  Psychiatric/Behavioral:  Negative for depression and hallucinations. The patient is not nervous/anxious.   Tolerating Diet:yes Tolerating PT:   DRUG ALLERGIES:   Allergies  Allergen Reactions   Metformin Diarrhea    Loose stools even with XR    VITALS:  Blood pressure 124/60, pulse 96, temperature 97.7 F (36.5 C), resp. rate 17, height 5\' 9"  (1.753 m), weight 108.9 kg, SpO2 96 %.  PHYSICAL EXAMINATION:   Physical Exam  GENERAL:  75 y.o.-year-old patient lying in the bed with no acute distress. obese HEENT: Head atraumatic, normocephalic. Oropharynx and nasopharynx clear.    LUNGS: Normal breath sounds bilaterally, no wheezing, rales, rhonchi. No use of accessory muscles of respiration.  CARDIOVASCULAR: S1, S2 normal. No murmurs, rubs, or gallops.  ABDOMEN: Soft, nontender, nondistended. Bowel sounds present. No  organomegaly or mass.  EXTREMITIES: No cyanosis, clubbing or edema b/l.    NEUROLOGIC: nonfocal PSYCHIATRIC:  patient is alert and oriented x 3.  SKIN: No obvious rash, lesion, or ulcer.   LABORATORY PANEL:  CBC Recent Labs  Lab 06/03/21 0429  WBC 2.8*  HGB 9.3*  HCT 28.2*  PLT 100*    Chemistries  Recent Labs  Lab 06/02/21 0926  NA 136  K 3.6  CL 104  CO2 25  GLUCOSE 146*  BUN 13  CREATININE 0.91  CALCIUM 8.7*  AST 33  ALT 40  ALKPHOS 56  BILITOT 0.7   Cardiac Enzymes No results for input(s): TROPONINI in the last 168 hours. RADIOLOGY:  DG Chest Portable 1 View  Result Date: 06/02/2021 CLINICAL DATA:  Chest pain, shortness of breath EXAM: PORTABLE CHEST 1 VIEW COMPARISON:  04/19/2021 FINDINGS: Cardiomegaly status post median sternotomy. Mild pulmonary vascular prominence and diffuse bilateral interstitial opacity. The visualized skeletal structures are unremarkable. IMPRESSION: Cardiomegaly with mild pulmonary vascular prominence and diffuse bilateral interstitial opacity, likely edema. No focal airspace opacity. Electronically Signed   By: Delanna Ahmadi M.D.   On: 06/02/2021 09:58   ECHOCARDIOGRAM LIMITED  Result Date: 06/03/2021    ECHOCARDIOGRAM LIMITED REPORT   Patient Name:   Yonael Tulloch [facility]. Date of Exam: 06/03/2021 Medical Rec #:  [id number]              Height:       69.0 in Accession #:    [id number]             Weight:       240.0 lb  Date of Birth:  1946/05/07              BSA:          2.232 m Patient Age:    57 years               BP:           124/60 mmHg Patient Gender: M                      HR:           91 bpm. Exam Location:  [facility] Procedure: Limited Echo, Limited Color Doppler and Cardiac Doppler Indications:     I48.91 Atrial fibrillation  History:         Patient has prior history of Echocardiogram examinations. CAD,                  Prior CABG, 2017 AVR, 23 mm bioprosthetic; Risk                  Factors:Diabetes, Hypertension, Dyslipidemia and  Sleep Apnea.  Sonographer:     Charmayne Sheer Referring Phys:  [id number] [place] Diagnosing Phys: Kathlyn Sacramento MD  Sonographer Comments: Technically difficult study due to poor echo windows. Image acquisition challenging due to patient body habitus. IMPRESSIONS  1. Left ventricular ejection fraction, by estimation, is 55 to 60%. The left ventricle has normal function. The left ventricle has no regional wall motion abnormalities. There is mild left ventricular hypertrophy. Left ventricular diastolic parameters are indeterminate.  2. Right ventricular systolic function is normal. The right ventricular size is normal. Tricuspid regurgitation signal is inadequate for assessing PA pressure.  3. The mitral valve is normal in structure. No evidence of mitral valve regurgitation. No evidence of mitral stenosis.  4. The aortic valve has been repaired/replaced. Aortic valve regurgitation is not visualized. No aortic stenosis is present. Echo findings are consistent with normal structure and function of the aortic valve prosthesis.  5. The inferior vena cava is normal in size with greater than 50% respiratory variability, suggesting right atrial pressure of 3 mmHg. FINDINGS  Left Ventricle: Left ventricular ejection fraction, by estimation, is 55 to 60%. The left ventricle has normal function. The left ventricle has no regional wall motion abnormalities. The left ventricular internal cavity size was normal in size. There is  mild left ventricular hypertrophy. Left ventricular diastolic parameters are indeterminate. Right Ventricle: The right ventricular size is normal. No increase in right ventricular wall thickness. Right ventricular systolic function is normal. Tricuspid regurgitation signal is inadequate for assessing PA pressure. Left Atrium: Left atrial size was normal in size. Right Atrium: Right atrial size was normal in size. Pericardium: There is no evidence of pericardial effusion. Mitral Valve: The mitral valve  is normal in structure. No evidence of mitral valve stenosis. Tricuspid Valve: The tricuspid valve is normal in structure. Tricuspid valve regurgitation is not demonstrated. No evidence of tricuspid stenosis. Aortic Valve: The aortic valve has been repaired/replaced. Aortic valve regurgitation is not visualized. No aortic stenosis is present. 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 Echo findings are consistent with normal structure and function of the aortic valve prosthesis. Pulmonic Valve: The pulmonic valve was normal in structure. Pulmonic valve regurgitation is not visualized. No evidence of pulmonic stenosis. Aorta: The aortic root is normal in size and structure. Venous: The inferior vena cava is normal in size with greater than 50% respiratory variability, suggesting  right atrial pressure of 3 mmHg. IAS/Shunts: No atrial level shunt detected by color flow Doppler. LEFT VENTRICLE PLAX 2D LVIDd:         3.97 cm LVIDs:         2.84 cm LV PW:         1.31 cm LV IVS:        0.98 cm LVOT diam:     2.30 cm LVOT Area:     4.15 cm  LEFT ATRIUM         Index LA diam:    3.60 cm 1.61 cm/m                        PULMONIC VALVE AORTA                 PV Vmax:       1.26 m/s Ao Root diam: 2.40 cm PV Vmean:      87.400 cm/s                       PV VTI:        0.229 m                       PV Peak grad:  6.4 mmHg                       PV Mean grad:  3.0 mmHg  MITRAL VALVE MV Area (PHT): 4.60 cm     SHUNTS MV Decel Time: 165 msec     Systemic Diam: 2.30 cm MV E velocity: 103.00 cm/s MV A velocity: 111.00 cm/s MV E/A ratio:  0.93 Kathlyn Sacramento MD Electronically signed by Kathlyn Sacramento MD Signature Date/Time: 06/03/2021/2:38:25 PM    Final    ASSESSMENT AND PLAN:  Holdan Stucke. is a 75 y.o. male with medical history significant of atrial fibrillation on Eliquis, hypertension, hyperlipidemia, diabetes mellitus, GERD, OSA not on CPAP, CAD, CABG, aortic valve replacement (bioprosthesis),  aortic valve bacterial endocarditis, BPH, SVT, occipital scalp infection, who presents with palpitation.  A fib with RVR -- patient came in with palpitation. His heart rate was anywhere around 150s. -- He was seen by [facility] cardiology -- started on amiodarone 400 mg BID -- continue metoprolol -- continue eliquis -- patient complains of some chest pressure. Dr. Fletcher Anon plans stress test tomorrow.  essential hypertension -- BP on the soft side -- cotn BB  BPH -- resume Flomax  type II diabetes , hyperglycemia with long-term use of insulin -- recent A1c 9.5 -- patient takes 70/30 insulin.  Hyperlipidemia continue Lipitor  CAD --  continue aspirin Lipitor   severe sepsis ongoing treatment for occipital scalp cellulitis -- blood culture negative -- lactic acid normalized -- sepsis resolved -- will change to PO doxycycline hundred milligrams for few more days. -- Patient follows with Dr. Ola Spurr infectious disease as outpatient. He was last seen on 11th November. -- Scalp area is indurated. No fever or erythema  Procedures: Family communication :none Consults :[facility] cardiology CODE STATUS: full DVT Prophylaxis : eliquis Level of care: Progressive Status is: Observation  The patient remains OBS appropriate and will d/c before 2 midnights. Management for palpitation, chest pressure      TOTAL TIME TAKING CARE OF THIS PATIENT: 25 minutes.  >50% time spent on counselling and coordination of care  Note: This dictation was prepared with Dragon dictation  along with smaller phrase technology. Any transcriptional errors that result from this process are unintentional.  Fritzi Mandes M.D    [facility]   CC: Primary care physician; Georges Mouse, MD Patient ID: Clifton James., male   DOB: 1946/07/14, 75 y.o.   MRN: [id number]

## 2021-06-03 NOTE — Progress Notes (Signed)
Progress Note  Patient Name: Jonathan Cross. Date of Encounter: 06/03/2021  [facility] Cardiologist: Nelva Bush, MD   Subjective   Patient remains in [place]. No chest pain or SOB.   Inpatient Medications    Scheduled Meds:  amiodarone  400 mg Oral BID   apixaban  5 mg Oral BID   aspirin EC  81 mg Oral Daily   atorvastatin  80 mg Oral QPM   insulin aspart  0-5 Units Subcutaneous QHS   insulin aspart  0-9 Units Subcutaneous TID WC   insulin aspart protamine- aspart  70 Units Subcutaneous BID WC   loratadine  10 mg Oral Daily   metoprolol succinate  25 mg Oral BID   pantoprazole  40 mg Oral Daily   Continuous Infusions:  sodium chloride 75 mL/hr at 06/02/21 1400   vancomycin     PRN Meds: acetaminophen, albuterol, nitroGLYCERIN, ondansetron (ZOFRAN) IV, oxyCODONE-acetaminophen   Vital Signs    Vitals:   06/03/21 0425 06/03/21 0525 06/03/21 0600 06/03/21 0735  BP: (!) 115/56 (!) 114/48 (!) 109/48 129/62  Pulse: 89 85 97 99  Resp: 12 17 15  (!) 22  Temp:    97.9 F (36.6 C)  TempSrc:    Oral  SpO2: 95% 91% 95% 97%  Weight:      Height:        Intake/Output Summary (Last 24 hours) at 06/03/2021 0801 Last data filed at 06/02/2021 1439 Gross per 24 hour  Intake 900 ml  Output 175 ml  Net 725 ml   Last 3 Weights 06/02/2021 04/19/2021 03/02/2021  Weight (lbs) 240 lb 256 lb 12.8 oz 250 lb  Weight (kg) 108.863 kg 116.484 kg 113.399 kg      Telemetry    NSR, HR 80s - Personally Reviewed  ECG    No new - Personally Reviewed  Physical Exam   GEN: No acute distress.   Neck: No JVD Cardiac: RRR, no murmurs, rubs, or gallops.  Respiratory: Clear to auscultation bilaterally. GI: Soft, nontender, non-distended  MS: No edema; No deformity. Neuro:  Nonfocal  Psych: Normal affect   Labs    High Sensitivity Troponin:   Recent Labs  Lab 06/02/21 0926 06/02/21 1210  TROPONINIHS 32* 35*     Chemistry Recent Labs  Lab 06/02/21 0926  NA 136   K 3.6  CL 104  CO2 25  GLUCOSE 146*  BUN 13  CREATININE 0.91  CALCIUM 8.7*  PROT 7.4  ALBUMIN 3.4*  AST 33  ALT 40  ALKPHOS 56  BILITOT 0.7  GFRNONAA >60  ANIONGAP 7    Lipids  Recent Labs  Lab 06/03/21 0429  CHOL 92  TRIG 134  HDL 16*  LDLCALC 49  CHOLHDL 5.8    Hematology Recent Labs  Lab 06/02/21 0926 06/03/21 0429  WBC 2.3* 2.8*  RBC 2.85* 2.50*  HGB 10.5* 9.3*  HCT 32.0* 28.2*  MCV 112.3* 112.8*  MCH 36.8* 37.2*  MCHC 32.8 33.0  RDW 16.5* 17.0*  PLT 107* 100*   Thyroid No results for input(s): TSH, FREET4 in the last 168 hours.  BNP Recent Labs  Lab 06/02/21 0927  BNP 88.9    DDimer No results for input(s): DDIMER in the last 168 hours.   Radiology    DG Chest Portable 1 View  Result Date: 06/02/2021 CLINICAL DATA:  Chest pain, shortness of breath EXAM: PORTABLE CHEST 1 VIEW COMPARISON:  04/19/2021 FINDINGS: Cardiomegaly status post median sternotomy. Mild pulmonary vascular prominence  and diffuse bilateral interstitial opacity. The visualized skeletal structures are unremarkable. IMPRESSION: Cardiomegaly with mild pulmonary vascular prominence and diffuse bilateral interstitial opacity, likely edema. No focal airspace opacity. Electronically Signed   By: Delanna Ahmadi M.D.   On: 06/02/2021 09:58    Cardiac Studies   Echo May 13, 2021   1. The left ventricle is normal in size with mildly increased wall  thickness.    2. The left ventricular systolic function is normal, LVEF is visually  estimated at 65-70%.    3. The right ventricle is normal in size, with normal systolic function.    4. Aortic valve replacement (23 mm bioprosthetic, implantation date:  01/2016).    5. The prosthetic aortic valve leaflets are not well visualized.    6. Aortic valve Doppler indices are consistent with normal prosthetic valve  function in a high flow state (peak V >72m/s, DVI 0.4, accel time <174ms)..    7. Rhythm: Tachycardia.     Patient Profile      75 y.o. male  with a hx of CAD s/p 2-V CABG (LIMA-LAD, SVG-OM) and severe AS s/p bioprosthetic AVR (01/2016) c/b prior endocarditis, AF (on eliquis) HTN, HLD, DM2, OSA, and BPH, presenting via EMS with chest pain, atrial fibrillation with RVR  Assessment & Plan   Aifb RVR - presented with palpitations found to be in afib RVR with rates up to 160s in the setting of severe sepsis. s/p lopressor with conversion to SR with hypotension. Cardizem/BB held for hypotension - did not tolerate diltiazem in the past 2/2 GI upset - continue Eliquis 5mg  BID for stroke ppx - metoprolol 50mg  daily restarted for rate control - Keep K>4 and Mag >2 - Echo TEE 11/2020 showed LVEF 50-60%. Will repeat limited echo - started on amiodarone 400mg  BID for 7 days then down to 200mg  daily  Sepsis 2/2 scalp cellulitis - recently hospitalized at [facility] for sepsis 2/2 occipital scalp cellulitis 2/2 MRSA - LA 2.5 - F/u BC - IV abx  CAD s/p CABG with LIMA to LAD and SVG to OM with AVR at [facility] 2017 - minimally elevated troponin peak 35, suspect demand ischemia in the setting of afib RVR - will repeat limited echo - No plan for ischemic work-up at this time - continue ASA, Lipitor and BB  HTN - PTA cardizem, metoprolol, amlodipine, lisinopril, Imdur held for hypotension - metoprolol restart for rate control - Bps wnl>>restart home meds as able  HLD - LDL 49 05/2021 - continue Lipitor 80mg  daily    For questions or updates, please contact [facility] Please consult [email or web address] for contact info under        Signed, Labib Cwynar Ninfa Meeker, PA-C  06/03/2021, 8:01 AM

## 2021-06-03 NOTE — ED Notes (Signed)
Pt in bed with eyes closed, pt emitting a snoring like noise, pt arouses easily to verbal stim, pt denies pain.  Resps even and unlabored.

## 2021-06-03 NOTE — Progress Notes (Signed)
*  PRELIMINARY RESULTS* Echocardiogram 2D Echocardiogram has been performed.  Jonathan Cross 06/03/2021, 11:55 AM

## 2021-06-04 ENCOUNTER — Observation Stay (HOSPITAL_BASED_OUTPATIENT_CLINIC_OR_DEPARTMENT_OTHER): Payer: Medicare HMO

## 2021-06-04 ENCOUNTER — Observation Stay: Payer: Medicare HMO

## 2021-06-04 DIAGNOSIS — R079 Chest pain, unspecified: Secondary | ICD-10-CM

## 2021-06-04 DIAGNOSIS — I4891 Unspecified atrial fibrillation: Secondary | ICD-10-CM | POA: Diagnosis not present

## 2021-06-04 LAB — NM MYOCAR MULTI W/SPECT W/WALL MOTION / EF
LV dias vol: 90 mL (ref 62–150)
LV sys vol: 32 mL
Nuc Stress EF: 64 %
Peak HR: 75 {beats}/min
Rest HR: 76 {beats}/min
Rest Nuclear Isotope Dose: 10.5 mCi
SDS: 0
SRS: 0
SSS: 0
ST Depression (mm): 0 mm
Stress Nuclear Isotope Dose: 31.2 mCi
TID: 1.16

## 2021-06-04 LAB — GLUCOSE, CAPILLARY
Glucose-Capillary: 177 mg/dL — ABNORMAL HIGH (ref 70–99)
Glucose-Capillary: 196 mg/dL — ABNORMAL HIGH (ref 70–99)
Glucose-Capillary: 245 mg/dL — ABNORMAL HIGH (ref 70–99)

## 2021-06-04 MED ORDER — AMIODARONE HCL 400 MG PO TABS: 400.0000 mg | ORAL_TABLET | Freq: Two times a day (BID) | ORAL | 0 refills | Status: DC

## 2021-06-04 MED ORDER — DOXYCYCLINE HYCLATE 100 MG PO TABS: 100.0000 mg | ORAL_TABLET | Freq: Two times a day (BID) | ORAL | 0 refills | Status: AC

## 2021-06-04 MED ORDER — ISOSORBIDE MONONITRATE ER 30 MG PO TB24
15.0000 mg | ORAL_TABLET | Freq: Every day | ORAL | Status: DC
  Administered 2021-06-04: 15 mg via ORAL
  Filled 2021-06-04: qty 1

## 2021-06-04 MED ORDER — TECHNETIUM TC 99M TETROFOSMIN IV KIT
31.1600 | PACK | Freq: Once | INTRAVENOUS | Status: AC | PRN
  Administered 2021-06-04: 31.16 via INTRAVENOUS

## 2021-06-04 MED ORDER — REGADENOSON 0.4 MG/5ML IV SOLN
0.4000 mg | Freq: Once | INTRAVENOUS | Status: AC
  Administered 2021-06-04: 0.4 mg via INTRAVENOUS

## 2021-06-04 MED ORDER — ISOSORBIDE MONONITRATE ER 30 MG PO TB24: 15.0000 mg | ORAL_TABLET | Freq: Every day | ORAL | 1 refills | Status: DC

## 2021-06-04 MED ORDER — TECHNETIUM TC 99M TETROFOSMIN IV KIT
10.0000 | PACK | Freq: Once | INTRAVENOUS | Status: AC | PRN
  Administered 2021-06-04: 10.51 via INTRAVENOUS

## 2021-06-04 NOTE — Progress Notes (Signed)
Progress Note  Patient Name: Jonathan Cross. Date of Encounter: 06/04/2021  [facility] Cardiologist: Nelva Bush, MD   Subjective   Patient reported chest pain and Myoview Carlton Adam was ordered.  Seen in stress test. Reported intermittent chest pain.   Inpatient Medications    Scheduled Meds:  amiodarone  400 mg Oral BID   apixaban  5 mg Oral BID   aspirin EC  81 mg Oral Daily   atorvastatin  80 mg Oral QPM   doxycycline  100 mg Oral Q12H   insulin aspart  0-5 Units Subcutaneous QHS   insulin aspart  0-9 Units Subcutaneous TID WC   insulin aspart protamine- aspart  70 Units Subcutaneous BID WC   loratadine  10 mg Oral Daily   metoprolol succinate  25 mg Oral BID   pantoprazole  40 mg Oral Daily   tamsulosin  0.4 mg Oral Daily   Continuous Infusions:  PRN Meds: acetaminophen, albuterol, calcium carbonate, nitroGLYCERIN, ondansetron (ZOFRAN) IV, oxyCODONE-acetaminophen   Vital Signs    Vitals:   06/03/21 1955 06/04/21 0107 06/04/21 0440 06/04/21 0700  BP: 114/63 109/63 117/64 117/70  Pulse: 81 83 77 76  Resp: 16 14 16 20   Temp: 97.7 F (36.5 C) 97.9 F (36.6 C) 97.9 F (36.6 C) 98.7 F (37.1 C)  TempSrc: Oral     SpO2: 97% 96% 98% 98%  Weight:      Height:        Intake/Output Summary (Last 24 hours) at 06/04/2021 0936 Last data filed at 06/04/2021 0456 Gross per 24 hour  Intake 971.65 ml  Output 1430 ml  Net -458.35 ml   Last 3 Weights 06/02/2021 04/19/2021 03/02/2021  Weight (lbs) 240 lb 256 lb 12.8 oz 250 lb  Weight (kg) 108.863 kg 116.484 kg 113.399 kg      Telemetry    NSR HR 70-80s - Personally Reviewed  ECG     - Personally Reviewed  Physical Exam   GEN: No acute distress.   Neck: No JVD Cardiac: RRR, no murmurs, rubs, or gallops.  Respiratory: Clear to auscultation bilaterally. GI: Soft, nontender, non-distended  MS: No edema; No deformity. Neuro:  Nonfocal  Psych: Normal affect   Labs    High Sensitivity  Troponin:   Recent Labs  Lab 06/02/21 0926 06/02/21 1210  TROPONINIHS 32* 35*     Chemistry Recent Labs  Lab 06/02/21 0926  NA 136  K 3.6  CL 104  CO2 25  GLUCOSE 146*  BUN 13  CREATININE 0.91  CALCIUM 8.7*  PROT 7.4  ALBUMIN 3.4*  AST 33  ALT 40  ALKPHOS 56  BILITOT 0.7  GFRNONAA >60  ANIONGAP 7    Lipids  Recent Labs  Lab 06/03/21 0429  CHOL 92  TRIG 134  HDL 16*  LDLCALC 49  CHOLHDL 5.8    Hematology Recent Labs  Lab 06/02/21 0926 06/03/21 0429  WBC 2.3* 2.8*  RBC 2.85* 2.50*  HGB 10.5* 9.3*  HCT 32.0* 28.2*  MCV 112.3* 112.8*  MCH 36.8* 37.2*  MCHC 32.8 33.0  RDW 16.5* 17.0*  PLT 107* 100*   Thyroid No results for input(s): TSH, FREET4 in the last 168 hours.  BNP Recent Labs  Lab 06/02/21 0927  BNP 88.9    DDimer No results for input(s): DDIMER in the last 168 hours.   Radiology    DG Chest Portable 1 View  Result Date: 06/02/2021 CLINICAL DATA:  Chest pain, shortness of breath EXAM:  PORTABLE CHEST 1 VIEW COMPARISON:  04/19/2021 FINDINGS: Cardiomegaly status post median sternotomy. Mild pulmonary vascular prominence and diffuse bilateral interstitial opacity. The visualized skeletal structures are unremarkable. IMPRESSION: Cardiomegaly with mild pulmonary vascular prominence and diffuse bilateral interstitial opacity, likely edema. No focal airspace opacity. Electronically Signed   By: Delanna Ahmadi M.D.   On: 06/02/2021 09:58   ECHOCARDIOGRAM LIMITED  Result Date: 06/03/2021    ECHOCARDIOGRAM LIMITED REPORT   Patient Name:   [name]. Date of Exam: 06/03/2021 Medical Rec #:  [id number]              Height:       69.0 in Accession #:    [id number]             Weight:       240.0 lb Date of Birth:  [birth year]              BSA:          2.232 m Patient Age:    1 years               BP:           124/60 mmHg Patient Gender: M                      HR:           91 bpm. Exam Location:  [facility] Procedure: Limited Echo, Limited Color  Doppler and Cardiac Doppler Indications:     I48.91 Atrial fibrillation  History:         Patient has prior history of Echocardiogram examinations. CAD,                  Prior CABG, 2017 AVR, 23 mm bioprosthetic; Risk                  Factors:Diabetes, Hypertension, Dyslipidemia and Sleep Apnea.  Sonographer:     Charmayne Sheer Referring Phys:  [id number] [place] Diagnosing Phys: Kathlyn Sacramento MD  Sonographer Comments: Technically difficult study due to poor echo windows. Image acquisition challenging due to patient body habitus. IMPRESSIONS  1. Left ventricular ejection fraction, by estimation, is 55 to 60%. The left ventricle has normal function. The left ventricle has no regional wall motion abnormalities. There is mild left ventricular hypertrophy. Left ventricular diastolic parameters are indeterminate.  2. Right ventricular systolic function is normal. The right ventricular size is normal. Tricuspid regurgitation signal is inadequate for assessing PA pressure.  3. The mitral valve is normal in structure. No evidence of mitral valve regurgitation. No evidence of mitral stenosis.  4. The aortic valve has been repaired/replaced. Aortic valve regurgitation is not visualized. No aortic stenosis is present. Echo findings are consistent with normal structure and function of the aortic valve prosthesis.  5. The inferior vena cava is normal in size with greater than 50% respiratory variability, suggesting right atrial pressure of 3 mmHg. FINDINGS  Left Ventricle: Left ventricular ejection fraction, by estimation, is 55 to 60%. The left ventricle has normal function. The left ventricle has no regional wall motion abnormalities. The left ventricular internal cavity size was normal in size. There is  mild left ventricular hypertrophy. Left ventricular diastolic parameters are indeterminate. Right Ventricle: The right ventricular size is normal. No increase in right ventricular wall thickness. Right ventricular systolic  function is normal. Tricuspid regurgitation signal is inadequate for assessing PA pressure. Left Atrium: Left atrial size was normal in  size. Right Atrium: Right atrial size was normal in size. Pericardium: There is no evidence of pericardial effusion. Mitral Valve: The mitral valve is normal in structure. No evidence of mitral valve stenosis. Tricuspid Valve: The tricuspid valve is normal in structure. Tricuspid valve regurgitation is not demonstrated. No evidence of tricuspid stenosis. Aortic Valve: The aortic valve has been repaired/replaced. Aortic valve regurgitation is not visualized. No aortic stenosis is present. 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 Echo findings are consistent with normal structure and function of the aortic valve prosthesis. Pulmonic Valve: The pulmonic valve was normal in structure. Pulmonic valve regurgitation is not visualized. No evidence of pulmonic stenosis. Aorta: The aortic root is normal in size and structure. Venous: The inferior vena cava is normal in size with greater than 50% respiratory variability, suggesting right atrial pressure of 3 mmHg. IAS/Shunts: No atrial level shunt detected by color flow Doppler. LEFT VENTRICLE PLAX 2D LVIDd:         3.97 cm LVIDs:         2.84 cm LV PW:         1.31 cm LV IVS:        0.98 cm LVOT diam:     2.30 cm LVOT Area:     4.15 cm  LEFT ATRIUM         Index LA diam:    3.60 cm 1.61 cm/m                        PULMONIC VALVE AORTA                 PV Vmax:       1.26 m/s Ao Root diam: 2.40 cm PV Vmean:      87.400 cm/s                       PV VTI:        0.229 m                       PV Peak grad:  6.4 mmHg                       PV Mean grad:  3.0 mmHg  MITRAL VALVE MV Area (PHT): 4.60 cm     SHUNTS MV Decel Time: 165 msec     Systemic Diam: 2.30 cm MV E velocity: 103.00 cm/s MV A velocity: 111.00 cm/s MV E/A ratio:  0.93 Kathlyn Sacramento MD Electronically signed by Kathlyn Sacramento MD Signature Date/Time:  06/03/2021/2:38:25 PM    Final     Cardiac Studies   Echo May 13, 2021   1. The left ventricle is normal in size with mildly increased wall  thickness.    2. The left ventricular systolic function is normal, LVEF is visually  estimated at 65-70%.    3. The right ventricle is normal in size, with normal systolic function.    4. Aortic valve replacement (23 mm bioprosthetic, implantation date:  01/2016).    5. The prosthetic aortic valve leaflets are not well visualized.    6. Aortic valve Doppler indices are consistent with normal prosthetic valve  function in a high flow state (peak V >95m/s, DVI 0.4, accel time <177ms)..    7. Rhythm: Tachycardia.     Patient Profile     75 y.o. 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 s/p lopressor with conversion to SR with hypotension. Cardizem/BB held for hypotension - did not tolerate diltiazem in the past 2/2 GI upset - continue Eliquis 5mg  BID for stroke ppx - metoprolol 50mg  daily restarted for rate control - Keep K>4 and Mag >2 - Echo TEE 11/2020 showed LVEF 50-60%. Repeat echo with LVEF 55-60% - started on amiodarone 400mg  BID for 7 days then down to 200mg  daily   Sepsis 2/2 scalp cellulitis - recently hospitalized at [facility] for sepsis 2/2 occipital scalp cellulitis 2/2 MRSA - LA 2.5 - F/u BC - IV abx   CAD s/p CABG with LIMA to LAD and SVG to OM with AVR at [facility] 2017 - minimally elevated troponin peak 35, suspect demand ischemia in the setting of afib RVR - will repeat limited echo - continue ASA, Lipitor and BB - patient had chest tightness and a Myoview lexiscan was ordered.   HTN - PTA cardizem,  metoprolol, amlodipine, lisinopril, Imdur held for hypotension - metoprolol restart for rate control - Bps wnl>>restart home meds as able   HLD - LDL 49 05/2021 - continue Lipitor 80mg  daily  For questions or updates, please contact [place] Please consult [email or web address] for contact info under        Signed, Rondo Spittler Ninfa Meeker, PA-C  06/04/2021, 9:36 AM

## 2021-06-04 NOTE — Discharge Summary (Signed)
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 is a 75 y.o. male with medical history significant of atrial fibrillation on Eliquis, hypertension, hyperlipidemia, diabetes mellitus, GERD, OSA not on CPAP, CAD, CABG, aortic valve replacement (bioprosthesis), aortic valve bacterial endocarditis, BPH, SVT, occipital scalp infection, who presents with palpitation.   A fib with RVR -- patient came in with palpitation. His heart rate was anywhere around 150s. -- He was seen by [facility] cardiology -- started on amiodarone 400 mg BID for 2 weeks--further dosing at out pt cardiology appt f/u -- continue metoprolol. D/c cardizem -- continue eliquis -- patient complains of some chest pressure. Dr. Fletcher Anon plans stress test tomorrow. --11/22- Myoview stress testLow risk, probably abnormal pharmacologic myocardial perfusion stress test.   There is a small in size, mild in severity, reversible defect involving the apical lateral and apical inferior segments most consistent with subtle ischemia but cannot rule out artifact.   There is no significant scar.   Left ventricular function is normal (LVEF 55-65%).   Attenuation  correction CT demonstrates coronary artery calcification, aortic atherosclerosis, and sequelae of prior CABG and aortic valve replacement --added imdur low dose  essential hypertension -- BP on the soft side -- cont BB   BPH -- resume Flomax   type II diabetes , hyperglycemia with long-term use of insulin -- recent A1c 9.5 -- patient takes 70/30 insulin.   Hyperlipidemia continue Lipitor   CAD --  continue aspirin Lipitor    severe sepsis ongoing treatment for occipital scalp cellulitis -- blood culture negative -- lactic acid normalized -- sepsis resolved -- will change to PO doxycycline hundred milligrams for few more days. -- Patient follows with Dr. Ola Spurr infectious disease as outpatient. He was last seen on 11th November. -- Scalp area is indurated. No fever or erythema  US GALLBLADDER NEGATIVE FOR GALLSTONES   Procedures: Family communication :none Consults :[facility] cardiology CODE STATUS: full DVT Prophylaxis : eliquis Level of care: Progressive Status is: Observation   overall hemodynamically stable. Will discharged to home with outpatient follow-up with cardiology. Okay from cardiology standpoint for discharge discussed with Dr. Saunders Revel CONSULTS OBTAINED:  Treatment Team:  Minna Merritts, MD  DRUG ALLERGIES:   Allergies  Allergen Reactions   Metformin Diarrhea    Loose stools even with XR    DISCHARGE MEDICATIONS:   Allergies as of 06/04/2021       Reactions   Metformin Diarrhea   Loose stools even with XR        Medication List     STOP taking these medications    diltiazem 240 MG 24 hr  capsule Commonly known as: CARDIZEM CD       TAKE these medications    amiodarone 400 MG tablet Commonly known as: PACERONE Take 1 tablet (400 mg total) by mouth 2 (two) times daily.   aspirin EC 81 MG tablet Take 81 mg by mouth daily.   atorvastatin 80 MG tablet Commonly known as: LIPITOR Take 80 mg by mouth every evening.   calcium  carbonate 1500 (600 Ca) MG Tabs tablet Commonly known as: OSCAL Take 1 tablet by mouth every morning.   doxycycline 100 MG tablet Commonly known as: VIBRA-TABS Take 1 tablet (100 mg total) by mouth every 12 (twelve) hours for 3 days.   Eliquis 5 MG Tabs tablet Generic drug: apixaban Take 5 mg by mouth 2 (two) times daily.   isosorbide mononitrate 30 MG 24 hr tablet Commonly known as: IMDUR Take 0.5 tablets (15 mg total) by mouth daily. What changed:  medication strength how much to take   loratadine 10 MG tablet Commonly known as: CLARITIN Take 10 mg by mouth daily.   metoprolol succinate 25 MG 24 hr tablet Commonly known as: TOPROL-XL Take 2 tablets (50 mg total) by mouth daily. What changed: how much to take   nitroGLYCERIN 0.4 MG SL tablet Commonly known as: NITROSTAT Place 0.4 mg under the tongue every 5 (five) minutes x 3 doses as needed for chest pain.   NovoLIN 70/30 ReliOn (70-30) 100 UNIT/ML injection Generic drug: insulin NPH-regular Human Inject 100 Units into the skin 2 (two) times daily.   omeprazole 20 MG capsule Commonly known as: PRILOSEC Take 20 mg by mouth every evening.   oxyCODONE-acetaminophen 5-325 MG tablet Commonly known as: Percocet Take 1 tablet by mouth every 8 (eight) hours as needed. What changed: reasons to take this   Potassium Citrate 15 MEQ (1620 MG) Tbcr Take 2 tablets by mouth 2 (two) times daily. What changed: how much to take   tamsulosin 0.4 MG Caps capsule Commonly known as: FLOMAX Take 0.4 mg by mouth daily.   terbinafine 1 % cream Commonly known as: LamISIL AT Apply 1 application topically 2 (two) times daily.        If you experience worsening of your admission symptoms, develop shortness of breath, life threatening emergency, suicidal or homicidal thoughts you must seek medical attention immediately by calling 911 or calling your MD immediately  if symptoms less severe.  You Must read complete  instructions/literature along with all the possible adverse reactions/side effects for all the Medicines you take and that have been prescribed to you. Take any new Medicines after you have completely understood and accept all the possible adverse reactions/side effects.   Please note  You were cared for by a hospitalist during your hospital stay. If you have any questions about your discharge medications or the care you received while you were in the hospital after you are discharged, you can call the unit and asked to speak with the hospitalist on call if the hospitalist that took care of you is not available. Once you are discharged, your primary care physician will handle any further medical issues. Please note that NO REFILLS for any discharge medications will be authorized once you are discharged, as it is imperative that you return to your primary care physician (or establish a relationship with a primary care physician if you do not have one) for your aftercare needs so that they can reassess your need for medications and monitor your lab values. Today   SUBJECTIVE  No new complaints   VITAL SIGNS:  Blood pressure 116/70, pulse 76, temperature 98.7 F (37.1 C), resp. rate 17, height 5\' 9"  (1.753 m), weight 108.9 kg, SpO2 90 %.  I/O:   Intake/Output Summary (Last 24 hours) at 06/04/2021 1527 Last data filed at 06/04/2021 1500 Gross per 24 hour  Intake 731.65 ml  Output 1380 ml  Net -648.35 ml    PHYSICAL EXAMINATION:  GENERAL:  75 y.o.-year-old patient lying in the bed with no acute distress. obese LUNGS: Normal breath sounds bilaterally, no wheezing, rales,rhonchi or crepitation. No use of accessory muscles of respiration.  CARDIOVASCULAR: S1, S2 normal. No murmurs, rubs, or gallops.  ABDOMEN: Soft, non-tender, non-distended. Bowel sounds present. No organomegaly or mass.  EXTREMITIES: No pedal edema, cyanosis, or clubbing.  NEUROLOGIC: non-focal PSYCHIATRIC:  patient is alert  and oriented x 3.   No erythema, no discharge  DATA REVIEW:   CBC  Recent Labs  Lab 06/03/21 0429  WBC 2.8*  HGB 9.3*  HCT 28.2*  PLT 100*    Chemistries  Recent Labs  Lab 06/02/21 0926  NA 136  K 3.6  CL 104  CO2 25  GLUCOSE 146*  BUN 13  CREATININE 0.91  CALCIUM 8.7*  AST 33  ALT 40  ALKPHOS 56  BILITOT 0.7    Microbiology Results   Recent Results (from the past 240 hour(s))  Blood culture (single)     Status: None (Preliminary result)   Collection Time: 06/02/21  9:28 AM   Specimen: BLOOD  Result Value Ref Range Status   Specimen Description BLOOD RARM  Final   Special Requests   Final    BOTTLES DRAWN AEROBIC AND ANAEROBIC Blood Culture adequate volume   Culture   Final    NO GROWTH 2 DAYS Performed at [facility], [address], [place], [place] [address]    Report Status PENDING  Incomplete  CULTURE, BLOOD (ROUTINE X 2) w Reflex to ID Panel     Status: None (Preliminary result)   Collection Time: 06/02/21 12:10 PM   Specimen: BLOOD  Result Value Ref Range Status   Specimen Description BLOOD BLOOD RIGHT FOREARM  Final   Special Requests   Final    BOTTLES DRAWN AEROBIC AND ANAEROBIC Blood Culture results may not be optimal due to an excessive volume of blood received in culture bottles   Culture   Final    NO GROWTH 2 DAYS Performed at [facility], [address], [place], [place] [address]    Report Status PENDING  Incomplete  Resp Panel by RT-PCR (Flu A&B, Covid) Nasopharyngeal Swab     Status: None   Collection Time: 06/02/21 12:10 PM   Specimen: Nasopharyngeal Swab; Nasopharyngeal(NP) swabs in vial transport medium  Result Value Ref Range Status   SARS Coronavirus 2 by RT PCR NEGATIVE NEGATIVE Final    Comment: (NOTE) SARS-CoV-2 target nucleic acids are NOT DETECTED.  The SARS-CoV-2 RNA is generally detectable in upper respiratory specimens during the acute phase of infection. The lowest concentration of SARS-CoV-2  viral copies this assay can detect is 138 copies/mL. A negative result does not preclude SARS-Cov-2 infection and should not be used as the sole basis for treatment or other patient management decisions. A negative result may occur with  improper specimen collection/handling, submission of specimen other than nasopharyngeal swab, presence of viral mutation(s) within the areas targeted by this assay, and inadequate number of viral copies(<138 copies/mL). A negative result must be combined  with clinical observations, patient history, and epidemiological information. The expected result is Negative.  Fact Sheet for Patients:  EntrepreneurPulse.com.au  Fact Sheet for Healthcare Providers:  IncredibleEmployment.be  This test is no t yet approved or cleared by the [place] FDA and  has been authorized for detection and/or diagnosis of SARS-CoV-2 by FDA under an Emergency Use Authorization (EUA). This EUA will remain  in effect (meaning this test can be used) for the duration of the COVID-19 declaration under Section 564(b)(1) of the Act, 21 U.S.C.section 360bbb-3(b)(1), unless the authorization is terminated  or revoked sooner.       Influenza A by PCR NEGATIVE NEGATIVE Final   Influenza B by PCR NEGATIVE NEGATIVE Final    Comment: (NOTE) The Xpert Xpress SARS-CoV-2/FLU/RSV plus assay is intended as an aid in the diagnosis of influenza from Nasopharyngeal swab specimens and should not be used as a sole basis for treatment. Nasal washings and aspirates are unacceptable for Xpert Xpress SARS-CoV-2/FLU/RSV testing.  Fact Sheet for Patients: EntrepreneurPulse.com.au  Fact Sheet for Healthcare Providers: IncredibleEmployment.be  This test is not yet approved or cleared by the [place] FDA and has been authorized for detection and/or diagnosis of SARS-CoV-2 by FDA under an Emergency Use Authorization  (EUA). This EUA will remain in effect (meaning this test can be used) for the duration of the COVID-19 declaration under Section 564(b)(1) of the Act, 21 U.S.C. section 360bbb-3(b)(1), unless the authorization is terminated or revoked.  Performed at [facility], [address] [place], [place] [address]     RADIOLOGY:  [place] Myocar Multi W/Spect Tamela Oddi Motion / EF  Result Date: 06/04/2021   Low risk, probably abnormal pharmacologic myocardial perfusion stress test.   There is a small in size, mild in severity, reversible defect involving the apical lateral and apical inferior segments most consistent with subtle ischemia but cannot rule out artifact.   There is no significant scar.   Left ventricular function is normal (LVEF 55-65%).   Attenuation correction CT demonstrates coronary artery calcification, aortic atherosclerosis, and sequelae of prior CABG and aortic valve replacement.   ECHOCARDIOGRAM LIMITED  Result Date: 06/03/2021    ECHOCARDIOGRAM LIMITED REPORT   Patient Name:   Emarion Toral [facility]. Date of Exam: 06/03/2021 Medical Rec #:  [id number]              Height:       69.0 in Accession #:    [id number]             Weight:       240.0 lb Date of Birth:  08-04-1945              BSA:          2.232 m Patient Age:    10 years               BP:           124/60 mmHg Patient Gender: M                      HR:           91 bpm. Exam Location:  [facility] Procedure: Limited Echo, Limited Color Doppler and Cardiac Doppler Indications:     I48.91 Atrial fibrillation  History:         Patient has prior history of Echocardiogram examinations. CAD,                  Prior  CABG, 2017 AVR, 23 mm bioprosthetic; Risk                  Factors:Diabetes, Hypertension, Dyslipidemia and Sleep Apnea.  Sonographer:     Charmayne Sheer Referring Phys:  [id number] [place] Diagnosing Phys: Kathlyn Sacramento MD  Sonographer Comments: Technically difficult study due to poor echo windows. Image acquisition  challenging due to patient body habitus. IMPRESSIONS  1. Left ventricular ejection fraction, by estimation, is 55 to 60%. The left ventricle has normal function. The left ventricle has no regional wall motion abnormalities. There is mild left ventricular hypertrophy. Left ventricular diastolic parameters are indeterminate.  2. Right ventricular systolic function is normal. The right ventricular size is normal. Tricuspid regurgitation signal is inadequate for assessing PA pressure.  3. The mitral valve is normal in structure. No evidence of mitral valve regurgitation. No evidence of mitral stenosis.  4. The aortic valve has been repaired/replaced. Aortic valve regurgitation is not visualized. No aortic stenosis is present. Echo findings are consistent with normal structure and function of the aortic valve prosthesis.  5. The inferior vena cava is normal in size with greater than 50% respiratory variability, suggesting right atrial pressure of 3 mmHg. FINDINGS  Left Ventricle: Left ventricular ejection fraction, by estimation, is 55 to 60%. The left ventricle has normal function. The left ventricle has no regional wall motion abnormalities. The left ventricular internal cavity size was normal in size. There is  mild left ventricular hypertrophy. Left ventricular diastolic parameters are indeterminate. Right Ventricle: The right ventricular size is normal. No increase in right ventricular wall thickness. Right ventricular systolic function is normal. Tricuspid regurgitation signal is inadequate for assessing PA pressure. Left Atrium: Left atrial size was normal in size. Right Atrium: Right atrial size was normal in size. Pericardium: There is no evidence of pericardial effusion. Mitral Valve: The mitral valve is normal in structure. No evidence of mitral valve stenosis. Tricuspid Valve: The tricuspid valve is normal in structure. Tricuspid valve regurgitation is not demonstrated. No evidence of tricuspid stenosis.  Aortic Valve: The aortic valve has been repaired/replaced. Aortic valve regurgitation is not visualized. No aortic stenosis is present. 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 Echo findings are consistent with normal structure and function of the aortic valve prosthesis. Pulmonic Valve: The pulmonic valve was normal in structure. Pulmonic valve regurgitation is not visualized. No evidence of pulmonic stenosis. Aorta: The aortic root is normal in size and structure. Venous: The inferior vena cava is normal in size with greater than 50% respiratory variability, suggesting right atrial pressure of 3 mmHg. IAS/Shunts: No atrial level shunt detected by color flow Doppler. LEFT VENTRICLE PLAX 2D LVIDd:         3.97 cm LVIDs:         2.84 cm LV PW:         1.31 cm LV IVS:        0.98 cm LVOT diam:     2.30 cm LVOT Area:     4.15 cm  LEFT ATRIUM         Index LA diam:    3.60 cm 1.61 cm/m                        PULMONIC VALVE AORTA                 PV Vmax:       1.26 m/s Ao Root diam: 2.40  cm PV Vmean:      87.400 cm/s                       PV VTI:        0.229 m                       PV Peak grad:  6.4 mmHg                       PV Mean grad:  3.0 mmHg  MITRAL VALVE MV Area (PHT): 4.60 cm     SHUNTS MV Decel Time: 165 msec     Systemic Diam: 2.30 cm MV E velocity: 103.00 cm/s MV A velocity: 111.00 cm/s MV E/A ratio:  0.93 Kathlyn Sacramento MD Electronically signed by Kathlyn Sacramento MD Signature Date/Time: 06/03/2021/2:38:25 PM    Final    US Abdomen Limited RUQ (LIVER/GB)  Result Date: 06/04/2021 CLINICAL DATA:  Abdominal pain EXAM: ULTRASOUND ABDOMEN LIMITED RIGHT UPPER QUADRANT COMPARISON:  None. FINDINGS: Gallbladder: No gallstones or wall thickening visualized. No sonographic Murphy sign noted by sonographer. Common bile duct: Diameter: 3 mm Liver: No solid lesion identified. Increased parenchymal echogenicity. Portal vein is patent on color Doppler imaging with normal direction of blood  flow towards the liver. Other: None. IMPRESSION: 1. Hepatic steatosis. 2. No acute ultrasound findings to explain pain. Consider CT or MRI to further evaluate otherwise unexplained abdominal pain. Electronically Signed   By: Delanna Ahmadi M.D.   On: 06/04/2021 11:55     CODE STATUS:     Code Status Orders  (From admission, onward)           Start     Ordered   06/02/21 1219  Full code  Continuous        06/02/21 1219           Code Status History     Date Active Date Inactive Code Status Order ID Comments User Context   11/15/2019 1905 11/18/2019 2121 Full Code [id number]  Leanor Kail, [place] Inpatient   11/15/2019 1527 11/15/2019 1751 Full Code [id number]  Rise Mu, PA-C Inpatient   08/13/2019 1338 08/19/2019 0147 Full Code [id number]  Ivor Costa, MD Inpatient   06/28/2019 2332 07/05/2019 1837 Full Code [id number]  Bernadette Hoit, DO ED   04/18/2019 1303 04/23/2019 1608 Full Code [id number]  Hillary Bow, MD ED        TOTAL TIME TAKING CARE OF THIS PATIENT: 40 minutes.    Fritzi Mandes M.D  [facility]    CC: Primary care physician; Georges Mouse, MD

## 2021-06-07 LAB — CULTURE, BLOOD (ROUTINE X 2): Culture: NO GROWTH

## 2021-06-07 LAB — CULTURE, BLOOD (SINGLE)
Culture: NO GROWTH
Special Requests: ADEQUATE

## 2021-06-13 ENCOUNTER — Encounter: Payer: Self-pay | Admitting: Internal Medicine

## 2021-06-14 ENCOUNTER — Encounter: Payer: Self-pay | Admitting: Internal Medicine

## 2021-06-14 ENCOUNTER — Ambulatory Visit (INDEPENDENT_AMBULATORY_CARE_PROVIDER_SITE_OTHER): Payer: Medicare HMO | Admitting: Internal Medicine

## 2021-06-14 ENCOUNTER — Other Ambulatory Visit: Payer: Self-pay

## 2021-06-14 VITALS — BP 140/76 | HR 89 | Ht 69.0 in | Wt 257.4 lb

## 2021-06-14 DIAGNOSIS — I10 Essential (primary) hypertension: Secondary | ICD-10-CM

## 2021-06-14 DIAGNOSIS — I480 Paroxysmal atrial fibrillation: Secondary | ICD-10-CM | POA: Diagnosis not present

## 2021-06-14 DIAGNOSIS — I5033 Acute on chronic diastolic (congestive) heart failure: Secondary | ICD-10-CM | POA: Diagnosis not present

## 2021-06-14 DIAGNOSIS — E1169 Type 2 diabetes mellitus with other specified complication: Secondary | ICD-10-CM

## 2021-06-14 DIAGNOSIS — E785 Hyperlipidemia, unspecified: Secondary | ICD-10-CM

## 2021-06-14 DIAGNOSIS — I25118 Atherosclerotic heart disease of native coronary artery with other forms of angina pectoris: Secondary | ICD-10-CM

## 2021-06-14 DIAGNOSIS — I5032 Chronic diastolic (congestive) heart failure: Secondary | ICD-10-CM | POA: Insufficient documentation

## 2021-06-14 MED ORDER — AMIODARONE HCL 200 MG PO TABS: 200.0000 mg | ORAL_TABLET | Freq: Every day | ORAL | 1 refills | Status: DC

## 2021-06-14 MED ORDER — FUROSEMIDE 20 MG PO TABS: 20.0000 mg | ORAL_TABLET | Freq: Every day | ORAL | 1 refills | Status: DC

## 2021-06-14 NOTE — Patient Instructions (Signed)
Medication Instructions:   Your physician has recommended you make the following change in your medication:   START Furosemide (Lasix) 20 mg daily  DECREASE Amiodarone 200 mg daily   *If you need a refill on your cardiac medications before your next appointment, please call your pharmacy*   Lab Work:  None ordered  Testing/Procedures:  None ordered   Follow-Up: At [facility], you and your health needs are our priority.  As part of our continuing mission to provide you with exceptional heart care, we have created designated Provider Care Teams.  These Care Teams include your primary Cardiologist (physician) and Advanced Practice Providers (APPs -  Physician Assistants and Nurse Practitioners) who all work together to provide you with the care you need, when you need it.  We recommend signing up for the patient portal called "MyChart".  Sign up information is provided on this After Visit Summary.  MyChart is used to connect with patients for Virtual Visits (Telemedicine).  Patients are able to view lab/test results, encounter notes, upcoming appointments, etc.  Non-urgent messages can be sent to your provider as well.   To learn more about what you can do with MyChart, go to [email or web address].    Your next appointment:    2 - 3  week(s)  The format for your next appointment:   In Person  Provider:   You may see Nelva Bush, MD or one of the following Advanced Practice Providers on your designated Care Team:   Murray Hodgkins, NP Christell Faith, PA-C Cadence Kathlen Mody, [place]

## 2021-06-14 NOTE — Progress Notes (Signed)
Follow-up Outpatient Visit Date: 06/14/2021  Primary Care Provider: Georges Mouse, MD No address on file  Chief Complaint: Hospital follow-up  HPI:  Jonathan Cross is a 75 y.o. male with history of CAD s/p 2-V CABG (LIMA-LAD, SVG-OM) and severe AS s/p bioprosthetic AVR (01/2016) c/b prior endocarditis, AF (on Eliquis) HTN, HLD, DM2, OSA, and BPH, who presents for hospital follow-up after recent admission for atrial fibrillation with rapid ventricular response in the setting of scalp cellulitis.  He had been admitted at [facility] in late October for occipital scalp cellulitis secondary to MRSA.  He presented to the [facility] emergency department on 06/02/2021 complaining of palpitations, chest pain, and generalized weakness.  He was noted to be in atrial fibrillation with ventricular rates of 160 bpm.  He converted to sinus rhythm with IV metoprolol and calcium.  Due to continued chest pain after returning to sinus rhythm, myocardial perfusion stress test was obtained.  This showed a small area of possible ischemia versus artifact in the apical lateral and apical inferior segments, overall a low risk study.  Decision was made to manage him medically with amiodarone, metoprolol, and isosorbide mononitrate.  He was discharged on apixaban.  Today, Jonathan Cross has several complaints including chest pain as well as intermittent palpitations.  He feels like his chest pain is due to amiodarone as it has actually gotten better when he skips a dose.  He describes it as a constant pressure along the upper portion of his chest not related to position or activity.  He denies shortness of breath.  He denies frank edema but has put on about 15 pounds since leaving the hospital.  His daughter is concerned that Jonathan Cross is swelling again.  He has had difficulty affording apixaban and has been without this medication for about a week.  He was also previously diagnosed with severe obstructive sleep apnea but never received a  CPAP machine.  His daughter has had a hard time getting in touch with the sleep medicine department at [facility].  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   Coronary artery disease    Diabetes ([place])    H/O aortic valve replacement    History of kidney stones    Hx of CABG    Hyperlipidemia    Hypertension    Sleep apnea    Past Surgical History:  Procedure Laterality Date   AORTIC VALVE REPLACEMENT  2017   [facility], bioprosthetic   APPENDECTOMY     CARDIAC VALVE REPLACEMENT     CORONARY ARTERY BYPASS GRAFT  2017   [facility] - LIMA-LAD and SVG-OM   CYSTOSCOPY W/ RETROGRADES Right 05/27/2019   Procedure: CYSTOSCOPY WITH RETROGRADE PYELOGRAM;  Surgeon: Billey Co, MD;  Location: [facility] ORS;  Service: Urology;  Laterality: Right;   CYSTOSCOPY W/ RETROGRADES Right 07/11/2019   Procedure: CYSTOSCOPY WITH RETROGRADE PYELOGRAM;  Surgeon: Billey Co, MD;  Location: [facility] ORS;  Service: Urology;  Laterality: Right;   CYSTOSCOPY/URETEROSCOPY/HOLMIUM LASER/STENT PLACEMENT Right 05/27/2019   Procedure: CYSTOSCOPY/URETEROSCOPY/HOLMIUM LASER/STENT PLACEMENT;  Surgeon: Billey Co, MD;  Location: [facility] ORS;  Service: Urology;  Laterality: Right;   CYSTOSCOPY/URETEROSCOPY/HOLMIUM LASER/STENT PLACEMENT Right 06/17/2019   Procedure: CYSTOSCOPY/URETEROSCOPY/HOLMIUM LASER/STENT Exchange;  Surgeon: Billey Co, MD;  Location: [facility] ORS;  Service: Urology;  Laterality: Right;   CYSTOSCOPY/URETEROSCOPY/HOLMIUM LASER/STENT PLACEMENT Right 07/11/2019   Procedure: CYSTOSCOPY/URETEROSCOPY/HOLMIUM LASER/STENT Exchange;  Surgeon: Billey Co, MD;  Location: [facility] OR[facility];  Service: Urology;  Laterality: Right;   STONE EXTRACTION WITH BASKET Right 07/11/2019   Procedure: STONE EXTRACTION WITH BASKET;  Surgeon: Billey Co, MD;  Location: [facility] ORS;   Service: Urology;  Laterality: Right;   TEE WITHOUT CARDIOVERSION N/A 04/21/2019   Procedure: TRANSESOPHAGEAL ECHOCARDIOGRAM (TEE);  Surgeon: Minna Merritts, MD;  Location: [facility] OR[facility];  Service: Cardiovascular;  Laterality: N/A;   TEE WITHOUT CARDIOVERSION N/A 07/04/2019   Procedure: TRANSESOPHAGEAL ECHOCARDIOGRAM (TEE);  Surgeon: Wellington Hampshire, MD;  Location: [facility];  Service: Cardiovascular;  Laterality: N/A;   TEE WITHOUT CARDIOVERSION N/A 08/17/2019   Procedure: TRANSESOPHAGEAL ECHOCARDIOGRAM (TEE);  Surgeon: Kate Sable, MD;  Location: [facility] OR[facility];  Service: Cardiovascular;  Laterality: N/A;   TEE WITHOUT CARDIOVERSION N/A 11/15/2019   Procedure: TRANSESOPHAGEAL ECHOCARDIOGRAM (TEE);  Surgeon: Nelva Bush, MD;  Location: [facility] OR[facility];  Service: Cardiovascular;  Laterality: N/A;   TONSILLECTOMY      Current Meds  Medication Sig   aspirin EC 81 MG tablet Take 81 mg by mouth daily.    atorvastatin (LIPITOR) 80 MG tablet Take 80 mg by mouth every evening.    ELIQUIS 5 MG TABS tablet Take 5 mg by mouth 2 (two) times daily.   furosemide (LASIX) 20 MG tablet Take 1 tablet (20 mg total) by mouth daily.   isosorbide mononitrate (IMDUR) 30 MG 24 hr tablet Take 0.5 tablets (15 mg total) by mouth daily.   metoprolol succinate (TOPROL-XL) 25 MG 24 hr tablet 25mg  by mouth in the AM and 50 mg by mouth in the PM   NOVOLIN 70/30 RELION (70-30) 100 UNIT/ML injection Inject 100 Units into the skin 2 (two) times daily.    Potassium Citrate 15 MEQ (1620 MG) TBCR Take 2 tablets by mouth 2 (two) times daily.   tamsulosin (FLOMAX) 0.4 MG CAPS capsule Take 0.4 mg by mouth daily.   terbinafine (LAMISIL AT) 1 % cream Apply 1 application topically 2 (two) times daily.   [DISCONTINUED] amiodarone (PACERONE) 400 MG tablet Take 1 tablet (400 mg total) by mouth 2 (two) times daily.    Allergies: Metformin  Social History   Tobacco Use   Smoking status: Former   Smokeless tobacco: Never  Brewing technologist Use: Never used  Substance Use Topics   Alcohol use: Never   Drug use: Never    Family History  Problem Relation Age of Onset   Heart attack Father 61    Review of Systems: A 12-system review of systems was performed and was negative except as noted in the HPI.  --------------------------------------------------------------------------------------------------  Physical Exam: BP 140/76   Pulse 89   Ht 5\' 9"  (1.753 m)   Wt 257 lb 6.4 oz (116.8 kg)   SpO2 96%   BMI 38.01 kg/m   General:  NAD. Neck: No JVD or HJR. Lungs: Clear to auscultation bilaterally without wheezes or crackles. Heart: Regular rate and rhythm with 1/6 systolic murmur.  No rubs or gallops.. Abdomen: Firm and nontender.  Unable to assess HSM due to body habitus. Extremities: 1-2+ chronic appearing edema in both calves.  EKG: Normal sinus rhythm with septal infarct and mild QT prolongation.  Sinus rhythm has replaced atrial fibrillation with rapid ventricular response since 06/02/2021.  Lab Results  Component Value Date   WBC 2.8 (L) 06/03/2021   HGB 9.3 (L) 06/03/2021   HCT 28.2 (L) 06/03/2021   MCV 112.8 (H) 06/03/2021   PLT 100 (L) 06/03/2021    Lab Results  Component  Value Date   NA 136 06/02/2021   K 3.6 06/02/2021   CL 104 06/02/2021   CO2 25 06/02/2021   BUN 13 06/02/2021   CREATININE 0.91 06/02/2021   GLUCOSE 146 (H) 06/02/2021   ALT 40 06/02/2021    Lab Results  Component Value Date   CHOL 92 06/03/2021   HDL 16 (L) 06/03/2021   LDLCALC 49 06/03/2021   TRIG 134 06/03/2021   CHOLHDL 5.8 06/03/2021    --------------------------------------------------------------------------------------------------  ASSESSMENT AND PLAN: Paroxysmal atrial fibrillation: Jonathan Cross is maintaining sinus rhythm though he has had occasional palpitations.  He reports chest pain that he attributes to amiodarone, as the discomfort seems to abate when he misses a dose.  He has completed an  adequate load at this point; we will decrease amiodarone to 200 mg daily.  We discussed anticoagulation options in the setting of a CHA2DS2-VASc score of at least 5, including DOAC and warfarin.  We will provide him with samples of apixaban for the rest of December.  Hopefully, his co-pay will decrease in the new year.  Patient assistance forms have also been provided.  If apixaban remains cost prohibitive going forward, warfarin would be an alternative.  Coronary artery disease with stable angina Atypical and potentially associated with amiodarone.  Myocardial perfusion stress test during recent hospitalization was low risk with small area of apical inferior/lateral ischemia or artifact.  We will continue current antianginal regimen and reevaluate his chest pain with dose reduction of amiodarone.  HFpEF and history of aortic valve replacement: Jonathan Cross appears volume overloaded on exam today, with weight up 17 pounds from recent hospital visit (I question accuracy of some of his weights).  Nonetheless, I recommend addition of furosemide 20 mg daily.  BMP will need to be rechecked when he returns for follow-up in 2-3 weeks.  Hypertension: Blood pressure borderline elevated today.  We will add furosemide as above and defer additional changes to his antihypertensive regimen.  Hyperlipidemia associated with type 2 diabetes mellitus: Continue high intensity statin therapy.  Follow-up: Return to clinic in 2-3 weeks with BMP at that time.  Long-term, the patient is still unsure if he wishes to follow with [place] or [facility] (he believes [facility] may be less costly for him though his family would prefer that he keep his care in [place]).  Nelva Bush, MD 06/14/2021 5:13 PM

## 2021-07-10 ENCOUNTER — Encounter: Payer: Self-pay | Admitting: Internal Medicine

## 2021-07-10 ENCOUNTER — Other Ambulatory Visit: Payer: Self-pay

## 2021-07-10 ENCOUNTER — Ambulatory Visit (INDEPENDENT_AMBULATORY_CARE_PROVIDER_SITE_OTHER): Payer: Medicare HMO | Admitting: Internal Medicine

## 2021-07-10 VITALS — BP 120/66 | HR 82 | Ht 69.0 in | Wt 255.0 lb

## 2021-07-10 DIAGNOSIS — I25118 Atherosclerotic heart disease of native coronary artery with other forms of angina pectoris: Secondary | ICD-10-CM | POA: Diagnosis not present

## 2021-07-10 DIAGNOSIS — Z952 Presence of prosthetic heart valve: Secondary | ICD-10-CM | POA: Diagnosis not present

## 2021-07-10 DIAGNOSIS — I5032 Chronic diastolic (congestive) heart failure: Secondary | ICD-10-CM

## 2021-07-10 DIAGNOSIS — I480 Paroxysmal atrial fibrillation: Secondary | ICD-10-CM | POA: Diagnosis not present

## 2021-07-10 MED ORDER — ELIQUIS 5 MG PO TABS: 5.0000 mg | ORAL_TABLET | Freq: Two times a day (BID) | ORAL | 3 refills | Status: DC

## 2021-07-10 MED ORDER — ISOSORBIDE MONONITRATE ER 30 MG PO TB24: 30.0000 mg | ORAL_TABLET | Freq: Every day | ORAL | 3 refills | Status: DC

## 2021-07-10 NOTE — Patient Instructions (Signed)
Medication Instructions:   Your physician has recommended you make the following change in your medication:   STOP taking Aspirin   INCREASE Imdur 30 mg daily   *If you need a refill on your cardiac medications before your next appointment, please call your pharmacy*   Lab Work:  Today: CBC, BMET  If you have labs (blood work) drawn today and your tests are completely normal, you will receive your results only by: [place] [facility] (if you have MyChart) OR A paper copy in the mail If you have any lab test that is abnormal or we need to change your treatment, we will call you to review the results.   Testing/Procedures:  None ordered   Follow-Up: At [facility], you and your health needs are our priority.  As part of our continuing mission to provide you with exceptional heart care, we have created designated Provider Care Teams.  These Care Teams include your primary Cardiologist (physician) and Advanced Practice Providers (APPs -  Physician Assistants and Nurse Practitioners) who all work together to provide you with the care you need, when you need it.  We recommend signing up for the patient portal called "MyChart".  Sign up information is provided on this After Visit Summary.  MyChart is used to connect with patients for Virtual Visits (Telemedicine).  Patients are able to view lab/test results, encounter notes, upcoming appointments, etc.  Non-urgent messages can be sent to your provider as well.   To learn more about what you can do with MyChart, go to [email or web address].    Your next appointment:   2 month(s)  The format for your next appointment:   In Person  Provider:   You may see Nelva Bush, MD or one of the following Advanced Practice Providers on your designated Care Team:   Murray Hodgkins, NP Christell Faith, PA-C Cadence Kathlen Mody, [place]

## 2021-07-10 NOTE — Progress Notes (Signed)
Follow-up Outpatient Visit Date: 07/10/2021  Primary Care Provider: Georges Mouse, MD No address on file  Chief Complaint: Follow-up CAD and atrial fibrillation  HPI:  Jonathan Cross is a 75 y.o. male with history of CAD s/p 2-V CABG (LIMA-LAD, SVG-OM) and severe AS s/p bioprosthetic AVR (01/2016) c/b prior endocarditis, AF (on Eliquis) HTN, HLD, DM2, OSA, and BPH, who presents for follow-up of atrial fibrillation, CAD, and valvular heart disease.  I last saw him 4 weeks ago, at which time Jonathan Cross had several complaints including chest pain and intermittent palpitations.  He was concerned that his chest pain was caused by amiodarone, as it improved when he skipped a dose.  He noted a 15 pound weight gain since leaving the hospital in late November following admission for atrial fibrillation with rapid ventricular response in the setting of scalp cellulitis.  At our follow-up visit, he was without apixaban for about a week due to affordability.  We agreed to restart apixaban (samples provided) and decrease amiodarone to 200 mg daily.  We agreed to add furosemide 20 mg daily for volume overload.  Today, Jonathan Cross reports that he has not been feeling great.  He still feels short of breath with minimal activity, sometimes with walking as little as 10 to 20 feet.  He has stable leg edema that did not improve much with recent addition of furosemide.  He denies orthopnea.  He notes that his chest pain has improved with de-escalation of amiodarone.  However, he believes that he may have had an episode of atrial fibrillation with prominent palpitations last week.  The episode ceased spontaneously after about an hour.  He notes occasional pressure in his chest when he walks.  He also reports an episode of epistaxis with blood-tinged mucus drainage from his nose over the course of 3 days.  He has remained on  apixaban.  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   Coronary artery disease    Diabetes ([facility])    H/O aortic valve replacement    History of kidney stones    Hx of CABG    Hyperlipidemia    Hypertension    Sleep apnea    Past Surgical History:  Procedure Laterality Date   AORTIC VALVE REPLACEMENT  2017   [facility], bioprosthetic   APPENDECTOMY     CARDIAC VALVE REPLACEMENT     CORONARY ARTERY BYPASS GRAFT  2017   [facility] - LIMA-LAD and SVG-OM   CYSTOSCOPY W/ RETROGRADES Right 05/27/2019   Procedure: CYSTOSCOPY WITH RETROGRADE PYELOGRAM;  Surgeon: Billey Co, MD;  Location: [facility];  Service: Urology;  Laterality: Right;   CYSTOSCOPY W/ RETROGRADES Right 07/11/2019   Procedure: CYSTOSCOPY WITH RETROGRADE PYELOGRAM;  Surgeon: Billey Co, MD;  Location: [facility];  Service: Urology;  Laterality: Right;   CYSTOSCOPY/URETEROSCOPY/HOLMIUM LASER/STENT PLACEMENT Right 05/27/2019   Procedure: CYSTOSCOPY/URETEROSCOPY/HOLMIUM LASER/STENT PLACEMENT;  Surgeon: Billey Co, MD;  Location: [facility];  Service: Urology;  Laterality: Right;   CYSTOSCOPY/URETEROSCOPY/HOLMIUM LASER/STENT PLACEMENT Right 06/17/2019   Procedure: CYSTOSCOPY/URETEROSCOPY/HOLMIUM LASER/STENT Exchange;  Surgeon: Billey Co, MD;  Location: [facility];  Service: Urology;  Laterality: Right;   CYSTOSCOPY/URETEROSCOPY/HOLMIUM LASER/STENT PLACEMENT Right 07/11/2019   Procedure: CYSTOSCOPY/URETEROSCOPY/HOLMIUM LASER/STENT Exchange;  Surgeon: Billey Co, MD;  Location: [facility];  Service: Urology;  Laterality: Right;   STONE EXTRACTION WITH BASKET Right 07/11/2019   Procedure: STONE EXTRACTION WITH BASKET;  Surgeon: Nickolas Madrid  C, MD;  Location: [facility];  Service: Urology;  Laterality: Right;   TEE WITHOUT CARDIOVERSION N/A 04/21/2019   Procedure:  TRANSESOPHAGEAL ECHOCARDIOGRAM (TEE);  Surgeon: Minna Merritts, MD;  Location: [facility];  Service: Cardiovascular;  Laterality: N/A;   TEE WITHOUT CARDIOVERSION N/A 07/04/2019   Procedure: TRANSESOPHAGEAL ECHOCARDIOGRAM (TEE);  Surgeon: Wellington Hampshire, MD;  Location: [facility];  Service: Cardiovascular;  Laterality: N/A;   TEE WITHOUT CARDIOVERSION N/A 08/17/2019   Procedure: TRANSESOPHAGEAL ECHOCARDIOGRAM (TEE);  Surgeon: Kate Sable, MD;  Location: [facility];  Service: Cardiovascular;  Laterality: N/A;   TEE WITHOUT CARDIOVERSION N/A 11/15/2019   Procedure: TRANSESOPHAGEAL ECHOCARDIOGRAM (TEE);  Surgeon: Nelva Bush, MD;  Location: [facility];  Service: Cardiovascular;  Laterality: N/A;   TONSILLECTOMY      Current Meds  Medication Sig   amiodarone (PACERONE) 200 MG tablet Take 1 tablet (200 mg total) by mouth daily.   aspirin EC 81 MG tablet Take 81 mg by mouth daily.    atorvastatin (LIPITOR) 80 MG tablet Take 80 mg by mouth every evening.    ELIQUIS 5 MG TABS tablet Take 5 mg by mouth 2 (two) times daily.   furosemide (LASIX) 20 MG tablet Take 1 tablet (20 mg total) by mouth daily.   isosorbide mononitrate (IMDUR) 30 MG 24 hr tablet Take 0.5 tablets (15 mg total) by mouth daily.   loratadine (CLARITIN) 10 MG tablet Take 10 mg by mouth daily.   metoprolol succinate (TOPROL-XL) 25 MG 24 hr tablet 25mg  by mouth in the AM and 50 mg by mouth in the PM   nitroGLYCERIN (NITROSTAT) 0.4 MG SL tablet Place 0.4 mg under the tongue every 5 (five) minutes x 3 doses as needed for chest pain.    NOVOLIN 70/30 RELION (70-30) 100 UNIT/ML injection Inject 100 Units into the skin 2 (two) times daily.    omeprazole (PRILOSEC) 20 MG capsule Take 20 mg by mouth every evening.   oxyCODONE-acetaminophen (PERCOCET) 5-325 MG tablet Take 1 tablet by mouth every 8 (eight) hours as needed.   Potassium Citrate 15 MEQ (1620 MG) TBCR Take 2 tablets by mouth 2 (two) times daily.   tamsulosin (FLOMAX) 0.4 MG  CAPS capsule Take 0.4 mg by mouth daily.   terbinafine (LAMISIL AT) 1 % cream Apply 1 application topically 2 (two) times daily.    Allergies: Metformin  Social History   Tobacco Use   Smoking status: Former   Smokeless tobacco: Never  Scientific laboratory technician Use: Never used  Substance Use Topics   Alcohol use: Never   Drug use: Never    Family History  Problem Relation Age of Onset   Heart attack Father 41   Heart attack Sister     Review of Systems: A 12-system review of systems was performed and was negative except as noted in the HPI.  --------------------------------------------------------------------------------------------------  Physical Exam: BP 120/66 (BP Location: Left Arm, Patient Position: Sitting, Cuff Size: Large)    Pulse 82    Ht 5\' 9"  (1.753 m)    Wt 255 lb (115.7 kg)    SpO2 96%    BMI 37.66 kg/m   General:  NAD. Neck: No JVD or HJR. Lungs: Clear to auscultation bilaterally without wheezes or crackles. Heart: Regular rate and rhythm with 2/6 systolic murmur. Abdomen: Soft, nontender, nondistended. Extremities: Trace pretibial edema.  EKG: Normal sinus rhythm with left atrial enlargement, LVH with abnormal repolarization, and septal infarct.  Compared with prior tracing from 06/14/2021, LVH  with abnormal repolarization is now present.  Lab Results  Component Value Date   WBC 2.8 (L) 06/03/2021   HGB 9.3 (L) 06/03/2021   HCT 28.2 (L) 06/03/2021   MCV 112.8 (H) 06/03/2021   PLT 100 (L) 06/03/2021    Lab Results  Component Value Date   NA 136 06/02/2021   K 3.6 06/02/2021   CL 104 06/02/2021   CO2 25 06/02/2021   BUN 13 06/02/2021   CREATININE 0.91 06/02/2021   GLUCOSE 146 (H) 06/02/2021   ALT 40 06/02/2021    Lab Results  Component Value Date   CHOL 92 06/03/2021   HDL 16 (L) 06/03/2021   LDLCALC 49 06/03/2021   TRIG 134 06/03/2021   CHOLHDL 5.8 06/03/2021     --------------------------------------------------------------------------------------------------  ASSESSMENT AND PLAN: Paroxysmal atrial fibrillation: Jonathan Cross is in sinus rhythm today though he reports an episode of palpitations concerning for paroxysmal atrial fibrillation that lasted about an hour a week ago.  We will plan to continue amiodarone 200 mg daily for now, noting that his QTC remains mildly prolonged.  Continued anticoagulation with apixaban recommended.  In the setting of epistaxis and long-term anticoagulation with apixaban, we will discontinue aspirin.  Chronic HFpEF: Jonathan Cross continues to have significant shortness of breath when walking short distances consistent with NYHA class III heart failure.  He has trace edema on exam today, slightly improved from his last visit.  His weight is also down 2 pounds.  We will continue with furosemide 20 mg daily and check a BMP to ensure stable renal function and electrolytes following addition of furosemide at our last visit.  Coronary artery disease with stable angina: Jonathan Cross reports occasional chest pain, especially when walking.  Notably, some of his chest pain has improved with de-escalation of amiodarone, suggesting an element of noncardiac pain as well.  Myocardial perfusion stress test during recent hospitalization was low risk with possible subtle apical lateral and apical inferior ischemia.  We have agreed to escalate isosorbide mononitrate to 30 mg daily and continue with metoprolol succinate 50 mg twice daily.  If he continues to have refractory symptoms, we will need to consider moving forward with right and left heart catheterization.  As above, we will discontinue aspirin in the setting of long-term anticoagulation with apixaban as well as recent epistaxis.  I will check a CBC today.  Status post aortic valve replacement: Limited echo last month showed appropriate function of bioprosthetic aortic valve.  Patient  has a history of endocarditis of the AVR, which has been managed medically.  Continue close clinical follow-up.  Follow-up: Return to clinic in 2 months  Nelva Bush, MD 07/10/2021 3:30 PM

## 2021-07-11 ENCOUNTER — Encounter: Payer: Self-pay | Admitting: Internal Medicine

## 2021-07-11 ENCOUNTER — Telehealth: Payer: Self-pay

## 2021-07-11 LAB — BASIC METABOLIC PANEL
BUN/Creatinine Ratio: 19 (ref 10–24)
BUN: 19 mg/dL (ref 8–27)
CO2: 24 mmol/L (ref 20–29)
Calcium: 9.6 mg/dL (ref 8.6–10.2)
Chloride: 100 mmol/L (ref 96–106)
Creatinine, Ser: 1 mg/dL (ref 0.76–1.27)
Glucose: 308 mg/dL — ABNORMAL HIGH (ref 70–99)
Potassium: 4.8 mmol/L (ref 3.5–5.2)
Sodium: 136 mmol/L (ref 134–144)
eGFR: 78 mL/min/{1.73_m2} (ref >59)

## 2021-07-11 LAB — CBC
Hematocrit: 34.9 % — ABNORMAL LOW (ref 37.5–51.0)
Hemoglobin: 11.3 g/dL — ABNORMAL LOW (ref 13.0–17.7)
MCH: 35.5 pg — ABNORMAL HIGH (ref 26.6–33.0)
MCHC: 32.4 g/dL (ref 31.5–35.7)
MCV: 110 fL — ABNORMAL HIGH (ref 79–97)
Platelets: 150 10*3/uL (ref 150–450)
RBC: 3.18 x10E6/uL — ABNORMAL LOW (ref 4.14–5.80)
RDW: 14.2 % (ref 11.6–15.4)
WBC: 5.1 10*3/uL (ref 3.4–10.8)

## 2021-07-11 NOTE — Telephone Encounter (Signed)
Eliquis 5 mg BID  RN received PA application at [place] [id number] RN Megan provider's portion, attached copy of insurance card and med list Dr. Saunders Revel has signed Forms faxed and placed in file cabinet  Received "successful" fax confirmation

## 2021-07-31 NOTE — Telephone Encounter (Signed)
Called and notified pt that we received fax from [place] [employer] that pt has been approved to receive Eliquis free of charge from 07/26/21 through 07/13/22.  Pt aware he will receive information by mail. Pt has no further questions at this time.  Fax placed in designated file with pt's application.

## 2021-08-06 ENCOUNTER — Other Ambulatory Visit
Admission: RE | Admit: 2021-08-06 | Discharge: 2021-08-06 | Disposition: A | Discharge status: Home / Self Care | Payer: Medicare HMO | Attending: Ophthalmology | Admitting: Ophthalmology

## 2021-08-06 DIAGNOSIS — H47012 Ischemic optic neuropathy, left eye: Secondary | ICD-10-CM | POA: Diagnosis present

## 2021-08-06 LAB — CBC WITH DIFFERENTIAL/PLATELET
Abs Immature Granulocytes: 0.05 10*3/uL (ref 0.00–0.07)
Basophils Absolute: 0 10*3/uL (ref 0.0–0.1)
Basophils Relative: 1 %
Eosinophils Absolute: 0.1 10*3/uL (ref 0.0–0.5)
Eosinophils Relative: 2 %
HCT: 30.1 % — ABNORMAL LOW (ref 39.0–52.0)
Hemoglobin: 9.8 g/dL — ABNORMAL LOW (ref 13.0–17.0)
Immature Granulocytes: 2 %
Lymphocytes Relative: 42 %
Lymphs Abs: 1.4 10*3/uL (ref 0.7–4.0)
MCH: 36.3 pg — ABNORMAL HIGH (ref 26.0–34.0)
MCHC: 32.6 g/dL (ref 30.0–36.0)
MCV: 111.5 fL — ABNORMAL HIGH (ref 80.0–100.0)
Monocytes Absolute: 0.4 10*3/uL (ref 0.1–1.0)
Monocytes Relative: 11 %
Neutro Abs: 1.5 10*3/uL — ABNORMAL LOW (ref 1.7–7.7)
Neutrophils Relative %: 42 %
Platelets: 113 10*3/uL — ABNORMAL LOW (ref 150–400)
RBC: 2.7 MIL/uL — ABNORMAL LOW (ref 4.22–5.81)
RDW: 15.1 % (ref 11.5–15.5)
Smear Review: DECREASED
WBC: 3.4 10*3/uL — ABNORMAL LOW (ref 4.0–10.5)
nRBC: 0 % (ref 0.0–0.2)

## 2021-08-06 LAB — SEDIMENTATION RATE: Sed Rate: 56 mm/hr — ABNORMAL HIGH (ref 0–20)

## 2021-08-06 LAB — C-REACTIVE PROTEIN: CRP: 0.6 mg/dL (ref <1.0)

## 2021-08-27 ENCOUNTER — Encounter (INDEPENDENT_AMBULATORY_CARE_PROVIDER_SITE_OTHER): Payer: Medicare HMO | Admitting: Vascular Surgery

## 2021-09-11 ENCOUNTER — Ambulatory Visit: Payer: Medicare HMO | Admitting: Nurse Practitioner

## 2021-09-11 ENCOUNTER — Encounter: Payer: Self-pay | Admitting: Nurse Practitioner

## 2021-09-11 ENCOUNTER — Emergency Department: Payer: Medicare HMO

## 2021-09-11 ENCOUNTER — Observation Stay
Admission: EM | Admit: 2021-09-11 | Discharge: 2021-09-13 | Disposition: A | Discharge status: Home Health | Payer: Medicare HMO | Attending: Internal Medicine | Admitting: Internal Medicine

## 2021-09-11 ENCOUNTER — Encounter: Payer: Self-pay | Admitting: Emergency Medicine

## 2021-09-11 ENCOUNTER — Other Ambulatory Visit: Payer: Self-pay

## 2021-09-11 ENCOUNTER — Encounter: Payer: Self-pay | Admitting: Osteopathic Medicine

## 2021-09-11 DIAGNOSIS — I25118 Atherosclerotic heart disease of native coronary artery with other forms of angina pectoris: Secondary | ICD-10-CM | POA: Diagnosis not present

## 2021-09-11 DIAGNOSIS — Z7901 Long term (current) use of anticoagulants: Secondary | ICD-10-CM | POA: Diagnosis not present

## 2021-09-11 DIAGNOSIS — Z87891 Personal history of nicotine dependence: Secondary | ICD-10-CM | POA: Diagnosis not present

## 2021-09-11 DIAGNOSIS — Z8673 Personal history of transient ischemic attack (TIA), and cerebral infarction without residual deficits: Secondary | ICD-10-CM | POA: Insufficient documentation

## 2021-09-11 DIAGNOSIS — I10 Essential (primary) hypertension: Secondary | ICD-10-CM | POA: Diagnosis present

## 2021-09-11 DIAGNOSIS — I25119 Atherosclerotic heart disease of native coronary artery with unspecified angina pectoris: Secondary | ICD-10-CM | POA: Insufficient documentation

## 2021-09-11 DIAGNOSIS — I480 Paroxysmal atrial fibrillation: Secondary | ICD-10-CM

## 2021-09-11 DIAGNOSIS — Z20822 Contact with and (suspected) exposure to covid-19: Secondary | ICD-10-CM | POA: Insufficient documentation

## 2021-09-11 DIAGNOSIS — Z794 Long term (current) use of insulin: Secondary | ICD-10-CM

## 2021-09-11 DIAGNOSIS — E119 Type 2 diabetes mellitus without complications: Secondary | ICD-10-CM | POA: Diagnosis not present

## 2021-09-11 DIAGNOSIS — D696 Thrombocytopenia, unspecified: Secondary | ICD-10-CM | POA: Diagnosis not present

## 2021-09-11 DIAGNOSIS — R42 Dizziness and giddiness: Principal | ICD-10-CM

## 2021-09-11 DIAGNOSIS — E785 Hyperlipidemia, unspecified: Secondary | ICD-10-CM | POA: Diagnosis present

## 2021-09-11 LAB — CBC
HCT: 38.2 % — ABNORMAL LOW (ref 39.0–52.0)
Hemoglobin: 12.3 g/dL — ABNORMAL LOW (ref 13.0–17.0)
MCH: 35.9 pg — ABNORMAL HIGH (ref 26.0–34.0)
MCHC: 32.2 g/dL (ref 30.0–36.0)
MCV: 111.4 fL — ABNORMAL HIGH (ref 80.0–100.0)
Platelets: 74 10*3/uL — ABNORMAL LOW (ref 150–400)
RBC: 3.43 MIL/uL — ABNORMAL LOW (ref 4.22–5.81)
RDW: 15.2 % (ref 11.5–15.5)
WBC: 9.4 10*3/uL (ref 4.0–10.5)
nRBC: 0 % (ref 0.0–0.2)

## 2021-09-11 LAB — TROPONIN I (HIGH SENSITIVITY)
Troponin I (High Sensitivity): 22 ng/L — ABNORMAL HIGH (ref <18)
Troponin I (High Sensitivity): 24 ng/L — ABNORMAL HIGH (ref <18)

## 2021-09-11 LAB — BASIC METABOLIC PANEL
Anion gap: 7 (ref 5–15)
BUN: 26 mg/dL — ABNORMAL HIGH (ref 8–23)
CO2: 25 mmol/L (ref 22–32)
Calcium: 8.7 mg/dL — ABNORMAL LOW (ref 8.9–10.3)
Chloride: 103 mmol/L (ref 98–111)
Creatinine, Ser: 0.97 mg/dL (ref 0.61–1.24)
GFR, Estimated: >60 mL/min (ref >60)
Glucose, Bld: 232 mg/dL — ABNORMAL HIGH (ref 70–99)
Potassium: 4.4 mmol/L (ref 3.5–5.1)
Sodium: 135 mmol/L (ref 135–145)

## 2021-09-11 LAB — URINALYSIS, ROUTINE W REFLEX MICROSCOPIC
Bacteria, UA: NONE SEEN
Bilirubin Urine: NEGATIVE
Glucose, UA: 150 mg/dL — AB
Ketones, ur: NEGATIVE mg/dL
Leukocytes,Ua: NEGATIVE
Nitrite: NEGATIVE
Protein, ur: NEGATIVE mg/dL
Specific Gravity, Urine: 1.019 (ref 1.005–1.030)
Squamous Epithelial / HPF: NONE SEEN (ref 0–5)
pH: 5 (ref 5.0–8.0)

## 2021-09-11 LAB — RESP PANEL BY RT-PCR (FLU A&B, COVID) ARPGX2
Influenza A by PCR: NEGATIVE
Influenza B by PCR: NEGATIVE
SARS Coronavirus 2 by RT PCR: NEGATIVE

## 2021-09-11 LAB — CBG MONITORING, ED: Glucose-Capillary: 172 mg/dL — ABNORMAL HIGH (ref 70–99)

## 2021-09-11 MED ORDER — ISOSORBIDE MONONITRATE ER 30 MG PO TB24
15.0000 mg | ORAL_TABLET | Freq: Every day | ORAL | Status: DC
  Administered 2021-09-12 – 2021-09-13 (×2): 15 mg via ORAL
  Filled 2021-09-11 (×4): qty 1

## 2021-09-11 MED ORDER — INSULIN ASPART 100 UNIT/ML IJ SOLN
0.0000 [IU] | Freq: Three times a day (TID) | INTRAMUSCULAR | Status: DC
  Administered 2021-09-12: 11 [IU] via SUBCUTANEOUS
  Administered 2021-09-12: 3 [IU] via SUBCUTANEOUS
  Administered 2021-09-12: 8 [IU] via SUBCUTANEOUS
  Administered 2021-09-13: 5 [IU] via SUBCUTANEOUS
  Administered 2021-09-13: 15 [IU] via SUBCUTANEOUS
  Filled 2021-09-11 (×5): qty 1

## 2021-09-11 MED ORDER — PANTOPRAZOLE SODIUM 40 MG PO TBEC
40.0000 mg | DELAYED_RELEASE_TABLET | Freq: Every day | ORAL | Status: DC
Start: 2021-09-11 — End: 2021-09-13
  Administered 2021-09-11 – 2021-09-13 (×3): 40 mg via ORAL
  Filled 2021-09-11 (×3): qty 1

## 2021-09-11 MED ORDER — AMIODARONE HCL 200 MG PO TABS
200.0000 mg | ORAL_TABLET | Freq: Every day | ORAL | Status: DC
  Administered 2021-09-11 – 2021-09-13 (×3): 200 mg via ORAL
  Filled 2021-09-11 (×3): qty 1

## 2021-09-11 MED ORDER — TAMSULOSIN HCL 0.4 MG PO CAPS
0.4000 mg | ORAL_CAPSULE | Freq: Every day | ORAL | Status: DC
  Administered 2021-09-11 – 2021-09-13 (×3): 0.4 mg via ORAL
  Filled 2021-09-11 (×3): qty 1

## 2021-09-11 MED ORDER — INSULIN ASPART 100 UNIT/ML IJ SOLN
4.0000 [IU] | Freq: Three times a day (TID) | INTRAMUSCULAR | Status: DC
  Administered 2021-09-12 – 2021-09-13 (×5): 4 [IU] via SUBCUTANEOUS
  Filled 2021-09-11 (×5): qty 1

## 2021-09-11 MED ORDER — POTASSIUM CITRATE ER 10 MEQ (1080 MG) PO TBCR
10.0000 meq | EXTENDED_RELEASE_TABLET | Freq: Two times a day (BID) | ORAL | Status: DC
  Administered 2021-09-11 – 2021-09-13 (×4): 10 meq via ORAL
  Filled 2021-09-11 (×5): qty 1

## 2021-09-11 MED ORDER — LORAZEPAM 1 MG PO TABS
1.0000 mg | ORAL_TABLET | Freq: Once | ORAL | Status: AC
  Administered 2021-09-11: 1 mg via ORAL
  Filled 2021-09-11: qty 1

## 2021-09-11 MED ORDER — INSULIN ASPART 100 UNIT/ML IJ SOLN
0.0000 [IU] | Freq: Every day | INTRAMUSCULAR | Status: DC
  Administered 2021-09-12: 3 [IU] via SUBCUTANEOUS
  Filled 2021-09-11 (×2): qty 1

## 2021-09-11 MED ORDER — APIXABAN 5 MG PO TABS
5.0000 mg | ORAL_TABLET | Freq: Two times a day (BID) | ORAL | Status: DC
Start: 2021-09-11 — End: 2021-09-13
  Administered 2021-09-11 – 2021-09-13 (×4): 5 mg via ORAL
  Filled 2021-09-11 (×5): qty 1

## 2021-09-11 MED ORDER — LACTATED RINGERS IV SOLN: INTRAVENOUS | Status: DC

## 2021-09-11 MED ORDER — METOPROLOL SUCCINATE ER 25 MG PO TB24
25.0000 mg | ORAL_TABLET | Freq: Every day | ORAL | Status: DC
  Administered 2021-09-12 – 2021-09-13 (×2): 25 mg via ORAL
  Filled 2021-09-11 (×2): qty 1

## 2021-09-11 MED ORDER — ATORVASTATIN CALCIUM 20 MG PO TABS
80.0000 mg | ORAL_TABLET | Freq: Every evening | ORAL | Status: DC
  Administered 2021-09-11 – 2021-09-12 (×2): 80 mg via ORAL
  Filled 2021-09-11 (×2): qty 4

## 2021-09-11 MED ORDER — SODIUM CHLORIDE 0.9 % IV BOLUS
500.0000 mL | Freq: Once | INTRAVENOUS | Status: AC
  Administered 2021-09-11: 500 mL via INTRAVENOUS

## 2021-09-11 MED ORDER — DIAZEPAM 2 MG PO TABS: 2.0000 mg | ORAL_TABLET | Freq: Four times a day (QID) | ORAL | Status: DC | PRN

## 2021-09-11 MED ORDER — METOPROLOL SUCCINATE ER 50 MG PO TB24
50.0000 mg | ORAL_TABLET | Freq: Every day | ORAL | Status: DC
Start: 2021-09-11 — End: 2021-09-13
  Administered 2021-09-12: 50 mg via ORAL
  Filled 2021-09-11 (×2): qty 1

## 2021-09-11 MED ORDER — PREDNISONE 10 MG PO TABS
10.0000 mg | ORAL_TABLET | Freq: Every day | ORAL | Status: DC
  Administered 2021-09-12 – 2021-09-13 (×2): 10 mg via ORAL
  Filled 2021-09-11 (×2): qty 1

## 2021-09-11 MED ORDER — MECLIZINE HCL 25 MG PO TABS
25.0000 mg | ORAL_TABLET | Freq: Once | ORAL | Status: AC
  Administered 2021-09-11: 25 mg via ORAL
  Filled 2021-09-11: qty 1

## 2021-09-11 MED ORDER — ASPIRIN EC 81 MG PO TBEC
81.0000 mg | DELAYED_RELEASE_TABLET | Freq: Every day | ORAL | Status: DC
  Administered 2021-09-12 – 2021-09-13 (×2): 81 mg via ORAL
  Filled 2021-09-11 (×2): qty 1

## 2021-09-11 NOTE — Progress Notes (Signed)
Dinner tray ordered for patient.

## 2021-09-11 NOTE — Progress Notes (Unsigned)
Office Visit    Patient Name: Jonathan Cross. 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 01/2016 CABG x 2: LIMA->LAD, VG->OM; b. 05/2021 MV: small, mild, rev apical alteral and apical inf defects ->subtle ischemia vs artifact, EF 55-65%-->low risk.   Diabetes ([place])    H/O aortic valve replacement    a. 01/2016 s/p bioprosthetic AVR @ [facility] for Severe AS; b. 05/2021 Echo: EF 55-60%, no rwma, mild LVH, nl RV fxn, nl fxn of bioprosthetic AVR.   History of kidney stones    Hx of CABG    Hyperlipidemia    Hypertension    PAF (paroxysmal atrial fibrillation) (HCC)    a. CHA2DS2VASc = 5-->eliquis/amio.   Sleep apnea    Past Surgical History:  Procedure Laterality Date   AORTIC VALVE REPLACEMENT  2017   [facility], bioprosthetic   APPENDECTOMY     CARDIAC VALVE REPLACEMENT     CORONARY ARTERY BYPASS GRAFT  2017   [facility] - LIMA-LAD and SVG-OM   CYSTOSCOPY W/ RETROGRADES Right 05/27/2019   Procedure: CYSTOSCOPY WITH RETROGRADE PYELOGRAM;  Surgeon: Billey Co, MD;  Location: [facility];  Service: Urology;  Laterality: Right;   CYSTOSCOPY W/ RETROGRADES Right 07/11/2019   Procedure: CYSTOSCOPY WITH RETROGRADE PYELOGRAM;  Surgeon: Billey Co, MD;  Location: [facility];  Service: Urology;  Laterality: Right;   CYSTOSCOPY/URETEROSCOPY/HOLMIUM LASER/STENT PLACEMENT Right 05/27/2019   Procedure: CYSTOSCOPY/URETEROSCOPY/HOLMIUM  LASER/STENT PLACEMENT;  Surgeon: Billey Co, MD;  Location: [facility];  Service: Urology;  Laterality: Right;   CYSTOSCOPY/URETEROSCOPY/HOLMIUM LASER/STENT PLACEMENT Right 06/17/2019   Procedure: CYSTOSCOPY/URETEROSCOPY/HOLMIUM LASER/STENT Exchange;  Surgeon: Billey Co, MD;  Location: [facility];  Service: Urology;  Laterality: Right;   CYSTOSCOPY/URETEROSCOPY/HOLMIUM LASER/STENT PLACEMENT Right 07/11/2019   Procedure: CYSTOSCOPY/URETEROSCOPY/HOLMIUM LASER/STENT Exchange;  Surgeon: Billey Co, MD;  Location: [facility];  Service: Urology;  Laterality: Right;   STONE EXTRACTION WITH BASKET Right 07/11/2019   Procedure: STONE EXTRACTION WITH BASKET;  Surgeon: Billey Co, MD;  Location: [facility];  Service: Urology;  Laterality: Right;   TEE WITHOUT CARDIOVERSION N/A 04/21/2019   Procedure: TRANSESOPHAGEAL ECHOCARDIOGRAM (TEE);  Surgeon: Minna Merritts, MD;  Location: [facility];  Service: Cardiovascular;  Laterality: N/A;   TEE WITHOUT CARDIOVERSION N/A 07/04/2019   Procedure: TRANSESOPHAGEAL ECHOCARDIOGRAM (TEE);  Surgeon: Wellington Hampshire, MD;  Location: [facility];  Service: Cardiovascular;  Laterality: N/A;   TEE WITHOUT CARDIOVERSION N/A 08/17/2019   Procedure: TRANSESOPHAGEAL ECHOCARDIOGRAM (TEE);  Surgeon: Kate Sable, MD;  Location: [facility];  Service: Cardiovascular;  Laterality: N/A;   TEE WITHOUT CARDIOVERSION N/A 11/15/2019   Procedure: TRANSESOPHAGEAL ECHOCARDIOGRAM (TEE);  Surgeon: Nelva Bush, MD;  Location: [facility];  Service: Cardiovascular;  Laterality: N/A;   TONSILLECTOMY      Allergies  Allergies  Allergen Reactions   Metformin Diarrhea    Loose stools even with XR    History of Present Illness    76 year old male with above past  medical history including CAD status post CABG x2, severe aortic stenosis status post bioprosthetic aortic valve replacement, bacterial endocarditis, paroxysmal atrial fibrillation, hypertension, hyperlipidemia, type 2  diabetes mellitus, sleep apnea, and BPH.  Patient was previously followed at [facility] and in the setting of severe aortic stenosis, and underwent bioprosthetic aortic valve replacement and two-vessel coronary artery bypass grafting.  Between October 2020 and May 2021, patient had multiple admissions related to fever, malaise, and Enterococcus faecalis bacteremia.  TEE's in October 2020 and December 2020 were negative for vegetation.  However, in February 2021, he was found to have a mobile mass on his prosthetic aortic valve. He was seen by infectious disease and treated with appropriate antibiotics for 6 weeks in the outpatient setting.  Prior to PICC line removal, repeat TEE in May 2021 continues to show an echodensity that appeared less mobile but larger, measuring 1.1 x 0.6 cm.  Perivalvular abscess could not be excluded.  He was transferred to [facility] where cardiac CT showed evidence of a small mass in the leaflet of the noncoronary cusp, likely representing a healed vegetation.  Leaflets were freely movable without evidence of destruction.  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  The LIMA to the LAD and vein graft to OM1 were patent.  He was seen by CT surgery who recommended continued observation and antibiotic therapy per infectious disease service, with recommendation for outpatient antibiotic follow-up.  Patient subsequently continue to follow-up with [facility] cardiology.  In November 2022, patient was admitted secondary to dyspnea and chest fullness.  He was found to be in rapid atrial fibrillation.  He was initially treated with diltiazem infusion but this resulted in hypotension requiring discontinuation.  He was loaded with amiodarone and converted to sinus rhythm.  He did report chest pain at the time of admission and stress testing was undertaken and showed a small, mild, reversible apical lateral and apical inferior  defect with question of subtle ischemia versus artifact.  Ultimately, this was felt to be a low risk study and he was medically managed.  He subsequently transition care to Dr. Saunders Revel, and was last seen in cardiology clinic in December 2022.  Home Medications    Current Outpatient Medications  Medication Sig Dispense Refill   amiodarone (PACERONE) 200 MG tablet Take 1 tablet (200 mg total) by mouth daily. 90 tablet 1   atorvastatin (LIPITOR) 80 MG tablet Take 80 mg by mouth every evening.      ELIQUIS 5 MG TABS tablet Take 1 tablet (5 mg total) by mouth 2 (two) times daily. 180 tablet 3   furosemide (LASIX) 20 MG tablet Take 1 tablet (20 mg total) by mouth daily. 30 tablet 1   isosorbide mononitrate (IMDUR) 30 MG 24 hr tablet Take 1 tablet (30 mg total) by mouth daily. 30 tablet 3   loratadine (CLARITIN) 10 MG tablet Take 10 mg by mouth daily.     metoprolol succinate (TOPROL-XL) 50 MG 24 hr tablet Take 50 mg by mouth 2 (two) times daily.     nitroGLYCERIN (NITROSTAT) 0.4 MG SL tablet Place 0.4 mg under the tongue every 5 (five) minutes x 3 doses as needed for chest pain.      NOVOLIN 70/30 RELION (70-30) 100 UNIT/ML injection Inject 100 Units into the skin 2 (two) times daily.      omeprazole (PRILOSEC) 20 MG capsule Take 20 mg by mouth every evening.  oxyCODONE-acetaminophen (PERCOCET) 5-325 MG tablet Take 1 tablet by mouth every 8 (eight) hours as needed. 20 tablet 0   Potassium Citrate 15 MEQ (1620 MG) TBCR Take 2 tablets by mouth 2 (two) times daily. 120 tablet 6   tamsulosin (FLOMAX) 0.4 MG CAPS capsule Take 0.4 mg by mouth daily.     terbinafine (LAMISIL AT) 1 % cream Apply 1 application topically 2 (two) times daily. 30 g 3   No current facility-administered medications for this visit.     Review of Systems    ***.  All other systems reviewed and are otherwise negative except as noted above.    Physical Exam    VS:  There were no vitals taken for this visit. , BMI There is no  height or weight on file to calculate BMI.     GEN: Well nourished, well developed, in no acute distress. HEENT: normal. Neck: Supple, no JVD, carotid bruits, or masses. Cardiac: RRR, no murmurs, rubs, or gallops. No clubbing, cyanosis, edema.  Radials/DP/PT 2+ and equal bilaterally.  Respiratory:  Respirations regular and unlabored, clear to auscultation bilaterally. GI: Soft, nontender, nondistended, BS + x 4. MS: no deformity or atrophy. Skin: warm and dry, no rash. Neuro:  Strength and sensation are intact. Psych: Normal affect.  Accessory Clinical Findings    ECG personally reviewed by me today - *** - no acute changes.  Lab Results  Component Value Date   WBC 3.4 (L) 08/06/2021   HGB 9.8 (L) 08/06/2021   HCT 30.1 (L) 08/06/2021   MCV 111.5 (H) 08/06/2021   PLT 113 (L) 08/06/2021   Lab Results  Component Value Date   CREATININE 1.00 07/10/2021   BUN 19 07/10/2021   NA 136 07/10/2021   K 4.8 07/10/2021   CL 100 07/10/2021   CO2 24 07/10/2021   Lab Results  Component Value Date   ALT 40 06/02/2021   AST 33 06/02/2021   ALKPHOS 56 06/02/2021   BILITOT 0.7 06/02/2021   Lab Results  Component Value Date   CHOL 92 06/03/2021   HDL 16 (L) 06/03/2021   LDLCALC 49 06/03/2021   TRIG 134 06/03/2021   CHOLHDL 5.8 06/03/2021    Lab Results  Component Value Date   HGBA1C 8.4 (H) 06/02/2021    Assessment & Plan    1.  ***   Murray Hodgkins, NP 09/11/2021, 9:10 AM

## 2021-09-11 NOTE — ED Notes (Signed)
Patient given urinal at this time for urine sample. ?

## 2021-09-11 NOTE — Assessment & Plan Note (Signed)
No concern for ACS at this time, continue home medication ?

## 2021-09-11 NOTE — Assessment & Plan Note (Addendum)
Dix-Hallpike maneuver ineffective in ED, due to ambulatory issues patient is not felt safe to discharge at this time though he has improved.  He still feels like he is about to fall but denies room spinning around him today ?Consult PT/OT.  Anyone with experience in vestibular rehab would be helpful. ?Started on Valium.  He has an episode of vertigo like this in the past. ?MRI of the brain did not show any acute intracranial abnormalities.  But showed chronic lacunar infarcts. ?He needs to follow-up with ENT as outpatient ?

## 2021-09-11 NOTE — ED Notes (Signed)
Patient given phone to call daughter at this time. ?

## 2021-09-11 NOTE — ED Notes (Signed)
Report received from [place], RN ?

## 2021-09-11 NOTE — Assessment & Plan Note (Addendum)
Placed on sliding scale insulin while here and Lantus.  Takes 70/30 insulin at home ?A1c pending ?

## 2021-09-11 NOTE — H&P (Signed)
HISTORY AND PHYSICAL   Patient: Jonathan Cross. 76 y.o. male MRN: [id number]  Today 09/11/21 is hospital day 0 after admission on 09/11/2021  8:10 AM  [place]: Acute onset vertigo this morning around 4 AM, patient describes as just feeling really unsteady, he notes that it is worse with turning head from side to side, initially noted this while trying to turn over in bed.  He fell at home onto his knees, no head trauma.  No recent ear pain, sinus illness, fever, sore throat. In ED, MRI was performed, did not show any acute changes though did show some chronic/old infarcts.  Patient was given meclizine and Valium which only helped minimally.  Multiple attempts were made to get the patient to ambulate but he was not felt safe for discharge home.  Procedures and Significant Results:  MRI brain wo contrast 09/11/21 "multiple chronic cortical infarcts, generally small except in the posterior left MCA territory where confluent encephalomalacia is noted. Occasional chronic cerebral micro-hemorrhages and lacunar infarcts."  Consultants:  none    SUBJECTIVE:  Patient confirms above history, states that dizziness/unsteadiness has only improved minimally.  He denies chest pain, headache, he reports compliance with home medications as reconciled.  No head trauma, no recent illness.     ASSESSMENT & PLAN  Vertigo, acute CVA ruled out via MRI, likely BPPV but assoc w/ ambulatory dysfunction and not responsive to conservative tx Dix-Hallpike maneuver ineffective in ED, due to ambulatory issues patient is not felt safe to discharge at this time though he has improved. Order for PT to evaluate and treat, anyone with experience in vestibular rehab would be helpful. Trial Valium every 6 hours over the next day, can transition to meclizine or can stay on Valium to follow-up with PCP if this improves his symptoms Patient educated that significant vertigo and absence of  obvious cause such as CVA is almost certainly benign, may need to get neurology involved if worsening/not improving to the point where he is safe to ambulate  Coronary artery disease of native artery of native heart with stable angina pectoris (HCC) No concern for ACS at this time, continue home medication  Type 2 diabetes mellitus without complication, with long-term current use of insulin ([place]) Placed on sliding scale insulin while here A1c pending    VTE Ppx: Continue home Eliquis CODE STATUS full code Admitted from: Home Expected Dispo: Home versus SNF, pending ambulatory function Barriers to discharge: Continued treatment of vertigo, PT evaluation Family communication: Patient is alert/oriented and has capacity to make his own decisions, he declined any communication with family/proxy decision makers              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 01/2016 CABG x 2: LIMA->LAD, VG->OM; b. 05/2021 MV: small, mild, rev apical alteral and apical inf defects ->subtle ischemia vs artifact, EF 55-65%-->low risk.   Diabetes ([place])    H/O aortic valve replacement    a. 01/2016 s/p bioprosthetic AVR @ [facility] for Severe AS; b. 05/2021 Echo: EF 55-60%, no rwma, mild LVH, nl RV fxn, nl fxn of bioprosthetic AVR.   History of kidney stones    Hx of CABG    Hyperlipidemia    Hypertension    PAF (paroxysmal atrial fibrillation) (HCC)    a. CHA2DS2VASc = 5-->eliquis/amio.   Sleep apnea     Past Surgical History:  Procedure Laterality  Date   AORTIC VALVE REPLACEMENT  2017   [facility], bioprosthetic   APPENDECTOMY     CARDIAC VALVE REPLACEMENT     CORONARY ARTERY BYPASS GRAFT  2017   [facility] - LIMA-LAD and SVG-OM   CYSTOSCOPY W/ RETROGRADES Right 05/27/2019   Procedure: CYSTOSCOPY WITH RETROGRADE PYELOGRAM;  Surgeon: Billey Co, MD;  Location: [facility];  Service: Urology;  Laterality: Right;   CYSTOSCOPY W/ RETROGRADES  Right 07/11/2019   Procedure: CYSTOSCOPY WITH RETROGRADE PYELOGRAM;  Surgeon: Billey Co, MD;  Location: [facility];  Service: Urology;  Laterality: Right;   CYSTOSCOPY/URETEROSCOPY/HOLMIUM LASER/STENT PLACEMENT Right 05/27/2019   Procedure: CYSTOSCOPY/URETEROSCOPY/HOLMIUM LASER/STENT PLACEMENT;  Surgeon: Billey Co, MD;  Location: [facility];  Service: Urology;  Laterality: Right;   CYSTOSCOPY/URETEROSCOPY/HOLMIUM LASER/STENT PLACEMENT Right 06/17/2019   Procedure: CYSTOSCOPY/URETEROSCOPY/HOLMIUM LASER/STENT Exchange;  Surgeon: Billey Co, MD;  Location: [facility];  Service: Urology;  Laterality: Right;   CYSTOSCOPY/URETEROSCOPY/HOLMIUM LASER/STENT PLACEMENT Right 07/11/2019   Procedure: CYSTOSCOPY/URETEROSCOPY/HOLMIUM LASER/STENT Exchange;  Surgeon: Billey Co, MD;  Location: [facility];  Service: Urology;  Laterality: Right;   STONE EXTRACTION WITH BASKET Right 07/11/2019   Procedure: STONE EXTRACTION WITH BASKET;  Surgeon: Billey Co, MD;  Location: [facility];  Service: Urology;  Laterality: Right;   TEE WITHOUT CARDIOVERSION N/A 04/21/2019   Procedure: TRANSESOPHAGEAL ECHOCARDIOGRAM (TEE);  Surgeon: Minna Merritts, MD;  Location: [facility];  Service: Cardiovascular;  Laterality: N/A;   TEE WITHOUT CARDIOVERSION N/A 07/04/2019   Procedure: TRANSESOPHAGEAL ECHOCARDIOGRAM (TEE);  Surgeon: Wellington Hampshire, MD;  Location: [facility];  Service: Cardiovascular;  Laterality: N/A;   TEE WITHOUT CARDIOVERSION N/A 08/17/2019   Procedure: TRANSESOPHAGEAL ECHOCARDIOGRAM (TEE);  Surgeon: Kate Sable, MD;  Location: [facility];  Service: Cardiovascular;  Laterality: N/A;   TEE WITHOUT CARDIOVERSION N/A 11/15/2019   Procedure: TRANSESOPHAGEAL ECHOCARDIOGRAM (TEE);  Surgeon: Nelva Bush, MD;  Location: [facility];  Service: Cardiovascular;  Laterality: N/A;   TONSILLECTOMY      Family History  Problem Relation Age of Onset   Heart attack Father 31   Heart attack Sister     Social History:  reports that he has quit smoking. He has never used smokeless tobacco. He reports that he does not drink alcohol and does not use drugs.  Allergies:  Allergies  Allergen Reactions   Metformin Diarrhea    Loose stools even with XR    (Not in a hospital admission)   Results for orders placed or performed during the hospital encounter of 09/11/21 (from the past 48 hour(s))  CBC     Status: Abnormal   Collection Time: 09/11/21  8:41 AM  Result Value Ref Range   WBC 9.4 4.0 - 10.5 K/uL   RBC 3.43 (L) 4.22 - 5.81 MIL/uL   Hemoglobin 12.3 (L) 13.0 - 17.0 g/dL   HCT 38.2 (L) 39.0 - 52.0 %   MCV 111.4 (H) 80.0 - 100.0 fL   MCH 35.9 (H) 26.0 - 34.0 pg   MCHC 32.2 30.0 - 36.0 g/dL   RDW 15.2 11.5 - 15.5 %   Platelets 74 (L) 150 - 400 K/uL    Comment: Immature Platelet Fraction may be clinically indicated, consider ordering this additional test [id number]    nRBC 0.0 0.0 - 0.2 %    Comment: Performed at [facility], [address], [place], [place] [address]  Basic metabolic panel     Status: Abnormal   Collection Time: 09/11/21  8:41 AM  Result Value Ref  Range   Sodium 135 135 - 145 mmol/L   Potassium 4.4 3.5 - 5.1 mmol/L    Comment: HEMOLYSIS AT THIS LEVEL MAY AFFECT RESULT   Chloride 103 98 - 111 mmol/L   CO2 25 22 - 32 mmol/L   Glucose, Bld 232 (H) 70 - 99 mg/dL    Comment: Glucose reference range applies only to samples taken after fasting for at least 8 hours.   BUN 26 (H) 8 - 23 mg/dL   Creatinine, Ser 0.97 0.61 - 1.24 mg/dL   Calcium 8.7 (L) 8.9 - 10.3 mg/dL   GFR, Estimated >60 >60 mL/min    Comment: (NOTE) Calculated using the CKD-EPI Creatinine Equation (2021)    Anion gap 7 5 - 15    Comment: Performed at [facility], [address] [place], [place] [address]  Troponin I (High Sensitivity)     Status: Abnormal   Collection Time: 09/11/21  8:41 AM  Result Value Ref Range   Troponin I (High Sensitivity) 22 (H) <18 ng/L     Comment: (NOTE) Elevated high sensitivity troponin I (hsTnI) values and significant  changes across serial measurements may suggest ACS but many other  chronic and acute conditions are known to elevate hsTnI results.  Refer to the "Links" section for chest pain algorithms and additional  guidance. Performed at [facility], [address] [place], [place] [address]   Resp Panel by RT-PCR (Flu A&B, Covid) Nasopharyngeal Swab     Status: None   Collection Time: 09/11/21  8:41 AM   Specimen: Nasopharyngeal Swab; Nasopharyngeal(NP) swabs in vial transport medium  Result Value Ref Range   SARS Coronavirus 2 by RT PCR NEGATIVE NEGATIVE    Comment: (NOTE) SARS-CoV-2 target nucleic acids are NOT DETECTED.  The SARS-CoV-2 RNA is generally detectable in upper respiratory specimens during the acute phase of infection. The lowest concentration of SARS-CoV-2 viral copies this assay can detect is 138 copies/mL. A negative result does not preclude SARS-Cov-2 infection and should not be used as the sole basis for treatment or other patient management decisions. A negative result may occur with  improper specimen collection/handling, submission of specimen other than nasopharyngeal swab, presence of viral mutation(s) within the areas targeted by this assay, and inadequate number of viral copies(<138 copies/mL). A negative result must be combined with clinical observations, patient history, and epidemiological information. The expected result is Negative.  Fact Sheet for Patients:  EntrepreneurPulse.com.au  Fact Sheet for Healthcare Providers:  IncredibleEmployment.be  This test is no t yet approved or cleared by the [place] FDA and  has been authorized for detection and/or diagnosis of SARS-CoV-2 by FDA under an Emergency Use Authorization (EUA). This EUA will remain  in effect (meaning this test can be used) for the duration of the COVID-19  declaration under Section 564(b)(1) of the Act, 21 U.S.C.section 360bbb-3(b)(1), unless the authorization is terminated  or revoked sooner.       Influenza A by PCR NEGATIVE NEGATIVE   Influenza B by PCR NEGATIVE NEGATIVE    Comment: (NOTE) The Xpert Xpress SARS-CoV-2/FLU/RSV plus assay is intended as an aid in the diagnosis of influenza from Nasopharyngeal swab specimens and should not be used as a sole basis for treatment. Nasal washings and aspirates are unacceptable for Xpert Xpress SARS-CoV-2/FLU/RSV testing.  Fact Sheet for Patients: EntrepreneurPulse.com.au  Fact Sheet for Healthcare Providers: IncredibleEmployment.be  This test is not yet approved or cleared by the [place] and has been authorized for  detection and/or diagnosis of SARS-CoV-2 by FDA under an Emergency Use Authorization (EUA). This EUA will remain in effect (meaning this test can be used) for the duration of the COVID-19 declaration under Section 564(b)(1) of the Act, 21 U.S.C. section 360bbb-3(b)(1), unless the authorization is terminated or revoked.  Performed at [facility], [address] [place], [place] [address]   Urinalysis, Routine w reflex microscopic Urine, Clean Catch     Status: Abnormal   Collection Time: 09/11/21  9:06 AM  Result Value Ref Range   Color, Urine YELLOW (A) YELLOW   APPearance CLEAR (A) CLEAR   Specific Gravity, Urine 1.019 1.005 - 1.030   pH 5.0 5.0 - 8.0   Glucose, UA 150 (A) NEGATIVE mg/dL   Hgb urine dipstick SMALL (A) NEGATIVE   Bilirubin Urine NEGATIVE NEGATIVE   Ketones, ur NEGATIVE NEGATIVE mg/dL   Protein, ur NEGATIVE NEGATIVE mg/dL   Nitrite NEGATIVE NEGATIVE   Leukocytes,Ua NEGATIVE NEGATIVE   WBC, UA 0-5 0 - 5 WBC/hpf   Bacteria, UA NONE SEEN NONE SEEN   Squamous Epithelial / LPF NONE SEEN 0 - 5   Mucus PRESENT     Comment: Performed at [facility], [place], [place]  [address]  Troponin I (High Sensitivity)     Status: Abnormal   Collection Time: 09/11/21 10:37 AM  Result Value Ref Range   Troponin I (High Sensitivity) 24 (H) <18 ng/L    Comment: (NOTE) Elevated high sensitivity troponin I (hsTnI) values and significant  changes across serial measurements may suggest ACS but many other  chronic and acute conditions are known to elevate hsTnI results.  Refer to the "Links" section for chest pain algorithms and additional  guidance. Performed at [facility], [address] [place],  [address]    MR BRAIN WO CONTRAST  Result Date: 09/11/2021 CLINICAL DATA:  76 year old male with dizziness, vertigo. EXAM: MRI HEAD WITHOUT CONTRAST TECHNIQUE: Multiplanar, multiecho pulse sequences of the brain and surrounding structures were obtained without intravenous contrast. COMPARISON:  None. FINDINGS: Brain: No restricted diffusion or evidence of acute infarction. Chronic encephalomalacia in the left posteroinferior parietal lobe, posterior left MCA territory. Minimal involvement of the left superior occipital lobe at the MCA/PCA watershed (series 15, image 28). Mild laminar necrosis. Minimal associated hemosiderin. Mild ex vacuo enlargement of the atrium of the left lateral ventricle. Occasional small areas of cortical encephalomalacia in the posterior right frontal, right parietal lobe, anterior left frontal lobe (series 15, image 43). Chronic microhemorrhage in the left temporal lobe. Chronic microhemorrhage in the left cerebellum. Tiny chronic lacunar infarct in the right caudate. Other deep gray matter nuclei, and the brainstem are within normal limits. No midline shift, mass effect, evidence of mass lesion, ventriculomegaly, extra-axial collection or acute intracranial hemorrhage. Cervicomedullary junction and pituitary are within normal limits. Vascular: Major intracranial vascular flow voids are preserved. Skull and upper cervical spine: Negative for age  visible cervical spine, degenerative ligamentous hypertrophy about the odontoid. Visualized bone marrow signal is within normal limits. Sinuses/Orbits: Negative orbits. Paranasal sinuses are well aerated. Other: Only trace mastoid air cell fluid on the left appears inconsequential. Negative visible nasopharynx. Grossly normal visible internal auditory structures. Normal stylomastoid foramina. Negative visible scalp and face. IMPRESSION: 1. No acute intracranial abnormality. 2. 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 Occasional chronic cerebral micro-hemorrhages and lacunar infarcts. Electronically Signed   By:  Genevie Ann M.D.   On: 09/11/2021 09:56    Review of Systems  Constitutional:  Negative for activity change, fatigue, fever and unexpected weight change.  HENT:  Negative for ear discharge, ear pain, hearing loss, rhinorrhea, sinus pressure, sinus pain, sore throat, tinnitus and trouble swallowing.   Respiratory:  Negative for cough, chest tightness and shortness of breath.   Cardiovascular:  Negative for chest pain, palpitations and leg swelling.  Gastrointestinal:  Negative for abdominal distention, abdominal pain, constipation and diarrhea.  Genitourinary:  Negative for difficulty urinating.  Musculoskeletal:  Positive for gait problem. Negative for arthralgias.  Skin:  Positive for rash (reprots chronic "yeast" on chest/abdomen). Negative for color change.  Neurological:  Positive for dizziness and light-headedness. Negative for tremors, seizures, syncope, facial asymmetry, speech difficulty, weakness, numbness and headaches.  Psychiatric/Behavioral:  Negative for behavioral problems, dysphoric mood and hallucinations. The patient is not nervous/anxious.    Blood pressure (!) 160/79, pulse (!) 51, temperature 97.8 F (36.6 C), temperature source Oral, resp. rate 16, height 5\' 9"  (1.753  m), weight 122.5 kg, SpO2 96 %. Physical Exam Constitutional:      General: He is not in acute distress.    Appearance: Normal appearance. He is obese.  HENT:     Head: Normocephalic and atraumatic.  Eyes:     Extraocular Movements: Extraocular movements intact.  Cardiovascular:     Rate and Rhythm: Normal rate and regular rhythm.  Pulmonary:     Effort: Pulmonary effort is normal.     Breath sounds: Normal breath sounds.  Abdominal:     General: There is no distension.     Palpations: Abdomen is soft.     Tenderness: There is no abdominal tenderness.  Musculoskeletal:        General: Normal range of motion.     Cervical back: Normal range of motion.     Right lower leg: No edema.     Left lower leg: No edema.  Skin:    General: Skin is warm and dry.     Findings: Rash (chest/abdomen c/w tinea) present.  Neurological:     General: No focal deficit present.     Mental Status: He is alert and oriented to person, place, and time.     Cranial Nerves: No cranial nerve deficit.     Sensory: No sensory deficit.     Motor: No weakness.     Coordination: Coordination abnormal.  Psychiatric:        Mood and Affect: Mood normal.        Behavior: Behavior normal.        Thought Content: Thought content normal.        Judgment: Judgment normal.      Emeterio Reeve, DO 09/11/2021, 3:20 PM

## 2021-09-11 NOTE — ED Notes (Signed)
Patient finished 100% of meal tray.  ?

## 2021-09-11 NOTE — ED Provider Notes (Signed)
[facility] Provider Note    Event Date/Time   First MD Initiated Contact with Patient 09/11/21 0825     (approximate)   History   Dizziness   HPI  Selwyn Reason. is a 76 y.o. male history of atrial fibrillation, coronary disease diabetes  Last evening patient reports he was having difficulty sleeping is laying in bed in about 4 AM he started to notice that he felt like his equilibrium was off.  This persisted for a couple hours, and he felt just dizzy like a spinning feeling while laying in his bed.  He worked yesterday he was in his normal state of health.  He went to bed as typical was not sleeping well and noted symptoms started at 4 AM  Then he tried to get out of bed at 7 AM and when he did he felt like his equilibrium was off and he fell forward striking his knees on a chair nearby the bed.  He denies that he feels like he injured the knees of the does feel slightly sore  He is felt off-and-on like he is just a bit dizzy or spinning feeling.  Thinks he had vertigo once in the past  Does have atrial fibrillation and he is compliant with his anticoagulant which she reports he takes Eliquis  No chest pain no trouble breathing.  No abdominal pain.  No numbness tingling or weakness or trouble speaking.    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 01/2016 CABG x 2: LIMA->LAD, VG->OM; b. 05/2021 MV: small, mild, rev apical alteral and apical inf defects ->subtle ischemia vs artifact, EF 55-65%-->low risk.   Diabetes ([place])    H/O aortic valve replacement    a. 01/2016 s/p bioprosthetic AVR @ [facility] for Severe AS; b. 05/2021 Echo: EF 55-60%, no rwma, mild LVH, nl RV fxn, nl fxn of bioprosthetic AVR.   History of kidney stones    Hx of CABG    Hyperlipidemia    Hypertension    PAF (paroxysmal atrial fibrillation) (HCC)    a. CHA2DS2VASc = 5-->eliquis/amio.   Sleep apnea           Physical Exam   Triage Vital Signs: ED Triage Vitals  Enc Vitals Group     BP 09/11/21 0815 (!) 145/71     Pulse Rate 09/11/21 0815 60     Resp 09/11/21 0815 18     Temp 09/11/21 0815 97.8 F (36.6 C)     Temp Source 09/11/21 0815 Oral     SpO2 09/11/21 0811 97 %     Weight 09/11/21 0813 270 lb (122.5 kg)     Height 09/11/21 0813 5\' 9"  (1.753 m)     Head Circumference --      Peak Flow --      Pain Score 09/11/21 0813 0     Pain Loc --      Pain Edu? --      Excl. in [place]? --     Most recent vital signs: Vitals:   09/11/21 1300 09/11/21 1315  BP:  (!) 160/79  Pulse:  (!) 51  Resp:  16  Temp:    SpO2: (!) 79% 96%     General: Awake, no distress.  Conversant.  Pleasant to speak with.  No obvious deficits.  Speaks comfortably and clearly CV:  Good peripheral perfusion.  Heart rate is regular, no  irregularity noted.  No murmurs Resp:  Normal effort.  No difficulty speaking or breathing.  Lung sounds are clear Abd:  No distention.  Abdomen soft nontender Other:  Normal and symmetric smile.  Extraocular movements are normal.  Speech is clear.  No pronator drift in the extremities bilateral.  Normal sensation, and cranial nerve examination.  No overt strokelike deficits.  Denies pain to the cervical spine palpation.  No pain in the neck. Atraumatic head  Able to range the lower extremities well, reports just a slight feeling of soreness across the knees anteriorly bilateral, but no noted hematomas deformities or contusions   ED Results / Procedures / Treatments   Labs (all labs ordered are listed, but only abnormal results are displayed) Labs Reviewed  CBC - Abnormal; Notable for the following components:      Result Value   RBC 3.43 (*)    Hemoglobin 12.3 (*)    HCT 38.2 (*)    MCV 111.4 (*)    MCH 35.9 (*)    Platelets 74 (*)    All other components within normal limits  BASIC METABOLIC PANEL - Abnormal; Notable for the following components:   Glucose, Bld 232  (*)    BUN 26 (*)    Calcium 8.7 (*)    All other components within normal limits  URINALYSIS, ROUTINE W REFLEX MICROSCOPIC - Abnormal; Notable for the following components:   Color, Urine YELLOW (*)    APPearance CLEAR (*)    Glucose, UA 150 (*)    Hgb urine dipstick SMALL (*)    All other components within normal limits  TROPONIN I (HIGH SENSITIVITY) - Abnormal; Notable for the following components:   Troponin I (High Sensitivity) 22 (*)    All other components within normal limits  TROPONIN I (HIGH SENSITIVITY) - Abnormal; Notable for the following components:   Troponin I (High Sensitivity) 24 (*)    All other components within normal limits  RESP PANEL BY RT-PCR (FLU A&B, COVID) [id number]     EKG  Reviewed inter by me at 815 Heart rate 60 QRS 99 QTc 470 Normal sinus rhythm, 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  Anteroseptal infarct is old, compared with previous EKG from October 2022   RADIOLOGY  I personally viewed and grossly interpreted the patient's MRI of the brain, I do not see evidence of a large acute infarct or hemorrhage.  Defer to radiologist for more detailed read  MR BRAIN WO CONTRAST IMPRESSION: 1. No acute intracranial abnormality. 2. 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 Occasional chronic cerebral micro-hemorrhages and lacunar infarcts. Electronically Signed   By: Genevie Ann M.D.   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  Differential diagnosis includes, but is not limited to, acute vertigo, stroke, intracranial hemorrhage, or other metabolic or other etiology such as infection.  No overt infectious symptoms.  No headache.  No injury to the head or cervical spine.  Reports slightly sore across the knees but very reassuring exam good range of motion, patient does not want an x-ray does not feel like it is broken and I agree.  He is on anticoagulation, and obtain CT of the head rapidly to evaluate for intracranial hemorrhage, if this is negative and given his history of A-fib anticoagulation I believe would be prudent to obtain MRI of the brain to rule out central cause for his somewhat vertiginous-like symptoms  Doubt CAD, EKG reassuring no chest pain no pulmonary symptoms.  No infectious symptoms noted.  Normotensive, in his normal state health until this abruptly occurred about 4 AM  ----------------------------------------- 10:05 AM on 09/11/2021 ----------------------------------------- Work-up reassuring to this point.  MRI of the brain without acute finding, chronic old infarcts are noted.  CBC and metabolic panel are reviewed and I do not see concerning finding.  His first troponin is noted to be just minimally elevated but in keeping with historical troponins.  Second troponin also reviewed and within normal limits for this patient's clinical history  At this time, given the patient's clinical history and reassuring MRI and work-up to this point I suspect likely dizziness is secondary to vertigo, not secondary to a central neurologic process or acute cardiac abnormality. The patient is on the cardiac monitor to evaluate for evidence of arrhythmia and/or significant heart rate changes.    Clinical Course as of 09/11/21 1320  Wed Sep 11, 2021  1115 Patient not able to safely  ambulate.  With getting up he continues to endorse feeling very dizzy room spinning sensation.  Feels "deflated" as he describes it.  We will give small dose of oral Ativan and IV fluids reassess for ongoing symptoms of dizziness and vertigo like symptoms. [MQ]  1115 Labs reviewed.  Glucose mildly elevated no anion gap.  Troponin minimally elevated, in keeping with patient's baseline.  Negative COVID test urinalysis without evidence of acute infection. [MQ]  1229 Having received meclizine and Ativan, will reattempt patient's stability with ambulation at this time.  Nursing to assist with ambulation [MQ]  1240 Patient feels slightly improved, but still not able to stand and ambulate independently without significant disequilibrium.  He appears to be a fall risk given the ongoing nature of vertigo-like symptoms.  I will admit him to the hospitalist team for further evaluation and work-up.  To me this appears likely consistent with peripheral vertigo, but at this time further evaluation and recommendation for admission was made by myself due to the increased risk of morbidity associated with imbalance and his use of an oral anticoagulant.  He is awake and alert and amenable and agreeable to this plan, feels fairly comfortable when resting but getting up he feels very dizzy with a room spinning sensation [MQ]    Clinical Course User Index [MQ] Delman Kitten, MD   ----------------------------------------- 1:19 PM on 09/11/2021 ----------------------------------------- Despite fluids and benzodiazepine, patient continues to have vertiginous spinning symptoms was trying to ambulate.  Discussed with Dr. Sheppard Coil of the hospitalist service, placed consultation with the hospitalist and decision made to admit for further care and management  Patient understanding agreeable with the plan for admission  FINAL CLINICAL IMPRESSION(S) / ED DIAGNOSES   Final diagnoses:  Vertigo     Rx / DC  Orders   ED  Discharge Orders     None        Note:  This document was prepared using Dragon voice recognition software and may include unintentional dictation errors.   Delman Kitten, MD 09/11/21 1320

## 2021-09-11 NOTE — ED Triage Notes (Signed)
Patient to ED via [facility] from home for dizziness that started around 5am. Patient alert and oriented at this time. ?

## 2021-09-12 ENCOUNTER — Encounter: Payer: Self-pay | Admitting: Nurse Practitioner

## 2021-09-12 ENCOUNTER — Encounter: Payer: Self-pay | Admitting: Osteopathic Medicine

## 2021-09-12 DIAGNOSIS — R42 Dizziness and giddiness: Secondary | ICD-10-CM | POA: Diagnosis not present

## 2021-09-12 DIAGNOSIS — D696 Thrombocytopenia, unspecified: Secondary | ICD-10-CM

## 2021-09-12 DIAGNOSIS — I480 Paroxysmal atrial fibrillation: Secondary | ICD-10-CM

## 2021-09-12 LAB — CBC WITH DIFFERENTIAL/PLATELET
Abs Immature Granulocytes: 0.13 10*3/uL — ABNORMAL HIGH (ref 0.00–0.07)
Basophils Absolute: 0 10*3/uL (ref 0.0–0.1)
Basophils Relative: 0 %
Eosinophils Absolute: 0.1 10*3/uL (ref 0.0–0.5)
Eosinophils Relative: 1 %
HCT: 40.6 % (ref 39.0–52.0)
Hemoglobin: 13.1 g/dL (ref 13.0–17.0)
Immature Granulocytes: 2 %
Lymphocytes Relative: 24 %
Lymphs Abs: 2.1 10*3/uL (ref 0.7–4.0)
MCH: 35.4 pg — ABNORMAL HIGH (ref 26.0–34.0)
MCHC: 32.3 g/dL (ref 30.0–36.0)
MCV: 109.7 fL — ABNORMAL HIGH (ref 80.0–100.0)
Monocytes Absolute: 0.9 10*3/uL (ref 0.1–1.0)
Monocytes Relative: 10 %
Neutro Abs: 5.7 10*3/uL (ref 1.7–7.7)
Neutrophils Relative %: 63 %
Platelets: 71 10*3/uL — ABNORMAL LOW (ref 150–400)
RBC: 3.7 MIL/uL — ABNORMAL LOW (ref 4.22–5.81)
RDW: 14.8 % (ref 11.5–15.5)
WBC: 8.9 10*3/uL (ref 4.0–10.5)
nRBC: 0 % (ref 0.0–0.2)

## 2021-09-12 LAB — HEMOGLOBIN A1C
Hgb A1c MFr Bld: 9.8 % — ABNORMAL HIGH (ref 4.8–5.6)
Mean Plasma Glucose: 235 mg/dL

## 2021-09-12 LAB — GLUCOSE, CAPILLARY
Glucose-Capillary: 249 mg/dL — ABNORMAL HIGH (ref 70–99)
Glucose-Capillary: 153 mg/dL — ABNORMAL HIGH (ref 70–99)
Glucose-Capillary: 298 mg/dL — ABNORMAL HIGH (ref 70–99)
Glucose-Capillary: 311 mg/dL — ABNORMAL HIGH (ref 70–99)
Glucose-Capillary: 263 mg/dL — ABNORMAL HIGH (ref 70–99)

## 2021-09-12 MED ORDER — INSULIN GLARGINE-YFGN 100 UNIT/ML ~~LOC~~ SOLN
30.0000 [IU] | Freq: Every day | SUBCUTANEOUS | Status: DC
  Administered 2021-09-13: 30 [IU] via SUBCUTANEOUS
  Filled 2021-09-12 (×2): qty 0.3

## 2021-09-12 MED ORDER — LOPERAMIDE HCL 2 MG PO CAPS: 2.0000 mg | ORAL_CAPSULE | ORAL | Status: DC | PRN

## 2021-09-12 NOTE — Evaluation (Signed)
Physical Therapy Evaluation ?Patient Details ?Name: Jonathan Cross. ?MRN: [id number] ?DOB: 19-Oct-1945 ?Today's Date: 09/12/2021 ? ?History of Present Illness ? Pt is a 76 y.o. male presenting to [facility] with dizziness (spinning feeling) while laying in his bed; fell forward striking his knees on chair nearby bed when attempting to get OOB.  PMH includes a-fib, CAD, bacterial endocarditis, DM, h/o aortic valve replacement, h/o CABG, htn, PAF, and sleep apnea.  ?Clinical Impression ? Prior to [facility] admission, pt was independent with functional mobility; lives with his daughter in 1 level home with 3-4 STE B railing.  Therapist attempted to see pt around 1105 AM but upon PT entering pt's room, diarrhea noted in pt's bed and on floor leading into bathroom; pt found in bathroom (pt c/o diarrhea and dizziness) and therapist assisted pt back to bed (sitting on clean linen) and NT then present with pt to assist with pt care/clean-up.  Therapist returned later to initiate evaluation (pt resting in bed at that time).  Pt reporting no dizziness laying in bed but reports getting dizzy getting OOB and walking (pt with difficult describing symptoms in general though).  PT consult for vestibular dysfunction (pt educated on vestibular testing, precautions/contraindications, and vestibular treatment): pt agreeable to vestibular testing/treatment.  Pt (+) L Dix-Hallpike test.  Performed Epley maneuver x2 (improved symptoms noted 2nd trial).  Pt then able to stand and walk to bathroom and then to recliner (with UE support) and pt reporting no dizziness anymore but just feeling weak (d/t diarrhea)--nurse notified.  Pt would benefit from skilled PT to address noted impairments and functional limitations (see below for any additional details).  Upon [facility] discharge, pt would benefit from [place].   ? ?Recommendations for follow up therapy are one component of a multi-disciplinary discharge planning process, led by the  attending physician.  Recommendations may be updated based on patient status, additional functional criteria and insurance authorization. ? ?Follow Up Recommendations Home health PT ? ?  ?Assistance Recommended at Discharge Intermittent Supervision/Assistance  ?Patient can return home with the following ? A little help with walking and/or transfers;Assistance with cooking/housework;Assist for transportation;Help with stairs or ramp for entrance ? ?  ?Equipment Recommendations Rolling walker (2 wheels);BSC/3in1  ?Recommendations for Other Services ?    ?  ?Functional Status Assessment Patient has had a recent decline in their functional status and demonstrates the ability to make significant improvements in function in a reasonable and predictable amount of time.  ? ?  ?Precautions / Restrictions Precautions ?Precautions: Fall ?Restrictions ?Weight Bearing Restrictions: No  ? ?  ? ?Mobility ? Bed Mobility ?Overal bed mobility: Independent ?  ?  ?  ?  ?  ?  ?General bed mobility comments: Supine to/from sitting ?  ? ?Transfers ?Overall transfer level: Needs assistance ?Equipment used: None ?Transfers: Sit to/from Stand ?Sit to Stand: Min guard ?  ?  ?  ?  ?  ?General transfer comment: requires at least single UE support for balance when standing (x2 trials from bed and x1 trial from toilet) ?  ? ?Ambulation/Gait ?Ambulation/Gait assistance: Min guard ?Gait Distance (Feet): 25 Feet ?Assistive device: Rolling walker (2 wheels) ?  ?Gait velocity: decreased ?  ?  ?General Gait Details: vc's to stay closer to RW ? ?Stairs ?  ?  ?  ?  ?  ? ?Wheelchair Mobility ?  ? ?Modified Rankin (Stroke Patients Only) ?  ? ?  ? ?Balance Overall balance assessment: Needs assistance ?Sitting-balance support: No upper extremity  supported, Feet supported ?Sitting balance-Leahy Scale: Good ?Sitting balance - Comments: steady sitting reaching outside BOS ?  ?Standing balance support: Single extremity supported ?Standing balance-Leahy Scale:  Fair ?Standing balance comment: pt requiring at least single UE support for static standing balance ?  ?  ?  ?  ?  ?  ?  ?  ?  ?  ?  ?   ? ? ? ?Pertinent Vitals/Pain Pain Assessment ?Pain Assessment: No/denies pain ?Vitals (HR and O2 on room air) stable and WFL throughout treatment session.  ? ? ?Home Living Family/patient expects to be discharged to:: Private residence ?Living Arrangements: Children (pt's adult daughter) ?Available Help at Discharge: Family ?Type of Home: House ?Home Access: Stairs to enter ?Entrance Stairs-Rails: Right;Left;Can reach both ?Entrance Stairs-Number of Steps: 3-4 ?  ?Home Layout: One level ?Home Equipment: Tub bench;Grab bars - tub/shower;Rolling Walker (2 wheels);Cane - single point ?   ?  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  ?  ?  ?  ?  ?  ?Mobility Comments: Pt reports "stumbling" and falling about 2-3 months ago but no injuries ?  ?  ? ? ?Hand Dominance  ?   ? ?  ?Extremity/Trunk Assessment  ? Upper Extremity Assessment ?Upper Extremity Assessment: Overall WFL for tasks assessed ?  ? ?Lower Extremity Assessment ?Lower Extremity Assessment: Overall WFL for tasks assessed ?  ? ?Cervical / Trunk Assessment ?Cervical / Trunk Assessment:  (forward head/shoulders)  ?Communication  ? Communication: No difficulties  ?Cognition Arousal/Alertness: Awake/alert ?Behavior During Therapy: [facility] for tasks assessed/performed ?Overall Cognitive Status: Within Functional Limits for tasks assessed ?  ?  ?  ?  ?  ?  ?  ?  ?  ?  ?  ?  ?  ?  ?  ?  ?  ?  ?  ? ?  ?General Comments General comments (skin integrity, edema, etc.): pt reports decreased L eye vision about 4-5 weeks ago (pt also reports he was told he had "mini strokes" 2-3 months ago base on imaging).  Nursing cleared pt for participation in physical therapy.  Pt agreeable to PT session. ? ?  ?Exercises    ? ?Assessment/Plan  ?  ?PT Assessment Patient needs  continued PT services  ?PT Problem List Decreased strength;Decreased activity tolerance;Decreased balance;Decreased mobility;Decreased knowledge of use of DME;Decreased knowledge of precautions ? ?   ?  ?PT Treatment Interventions DME instruction;Gait training;Stair training;Functional mobility training;Therapeutic activities;Therapeutic exercise;Balance training;Patient/family education;Manual techniques   ? ?PT Goals (Current goals can be found in the Care Plan section)  ?Acute Rehab PT Goals ?Patient Stated Goal: to improve dizziness and strength ?PT Goal Formulation: With patient ?Time For Goal Achievement: 09/26/21 ?Potential to Achieve Goals: Good ? ?  ?Frequency Min 2X/week ?  ? ? ?Co-evaluation   ?  ?  ?  ?  ? ? ?  ?AM-PAC PT "6 Clicks" Mobility  ?Outcome Measure Help needed turning from your back to your side while in a flat bed without using bedrails?: None ?Help needed moving from lying on your back to sitting on the side of a flat bed without using bedrails?: None ?Help needed moving to and from a bed to a chair (including a wheelchair)?: A Little ?Help needed standing up from a chair using your arms (e.g., wheelchair or bedside chair)?: A Little ?Help needed to walk in [facility] room?: A Little ?Help needed climbing 3-5 steps with a railing? : A  Little ?6 Click Score: 20 ? ?  ?End of Session   ?Activity Tolerance: Patient limited by fatigue ?Patient left: in chair;with call bell/phone within reach;with chair alarm set ?Nurse Communication: Mobility status;Precautions ?PT Visit Diagnosis: Unsteadiness on feet (R26.81);Other abnormalities of gait and mobility (R26.89);Muscle weakness (generalized) (M62.81);History of falling (Z91.81);BPPV;Dizziness and giddiness (R42) ?BPPV - Right/Left : Left (R not tested on this date) ?  ? ?Time: 9518-8416 ?PT Time Calculation (min) (ACUTE ONLY): 41 min ? ? ?Charges:   PT Evaluation ?$PT Eval Low Complexity: 1 Low ?PT Treatments ?$Therapeutic Activity: 8-22  mins ?$Canalith Rep Proc: 8-22 mins ?  ?   ? ?Leitha Bleak, PT ?09/12/21, 1:55 PM ? ? ?

## 2021-09-12 NOTE — Assessment & Plan Note (Signed)
Currently normal sinus rhythm.  On Eliquis for anticoagulation.  On metoprolol for rate control ?

## 2021-09-12 NOTE — Assessment & Plan Note (Signed)
Takes Lipitor 80 mg daily ?

## 2021-09-12 NOTE — Progress Notes (Addendum)
PROGRESS NOTE  [name]  YPP:[id number] DOB: 1946-05-01 DOA: 09/11/2021 PCP: Georges Mouse, MD   Brief Narrative: Patient is a 76 year old male with history of coronary disease status post CABG, hyperlipidemia diabetes type 2, hypertension, paroxysmal A-fib, sleep apnea who presented to the emergency department from home with complaints of dizziness.  He described the dizziness as room is spinning around him.  This was triggered when he was on his bed.  He could not walk and felt like he will fall.  He had an episode like this in the past.  Patient was tried to be discharged from ED but could not be discharged because of risk for fall.  PT consulted.  Started on Valium for vertigo.  Assessment & Plan:  Principal Problem:   Vertigo, acute CVA ruled out via MRI, likely BPPV but assoc w/ ambulatory dysfunction and not responsive to conservative tx Active Problems:   Coronary artery disease of native artery of native heart with stable angina pectoris ([place])   Essential hypertension   Type 2 diabetes mellitus without complication, with long-term current use of insulin (HCC)   Hyperlipidemia LDL goal <70   Paroxysmal A-fib (HCC)   Thrombocytopenia (HCC)   Assessment and Plan: * Vertigo, acute CVA ruled out via MRI, likely BPPV but assoc w/ ambulatory dysfunction and not responsive to conservative tx Dix-Hallpike maneuver ineffective in ED, due to ambulatory issues patient is not felt safe to discharge at this time though he has improved.  He still feels like he is about to fall but denies room spinning around him today Consult PT/OT.  Anyone with experience in vestibular rehab would be helpful. Started on Valium.  He has an episode of vertigo like this in the past. MRI of the brain did not show any acute intracranial abnormalities.  But showed chronic lacunar infarcts. He needs to follow-up with ENT as outpatient  Thrombocytopenia ([place]) Unclear etiology.  On reviewing his  previous blood work, his platelets are on the lower side.  Currently stable in the range of 70s.  Paroxysmal A-fib (HCC) Currently normal sinus rhythm.  On Eliquis for anticoagulation.  On metoprolol for rate control  Hyperlipidemia LDL goal <70 Takes Lipitor 80 mg daily  Type 2 diabetes mellitus without complication, with long-term current use of insulin ([place]) Placed on sliding scale insulin while here and Lantus.  Takes 70/30 insulin at home A1c pending  Essential hypertension Monitor blood pressure.  Currently above stable.  Continue home medications.  Coronary artery disease of native artery of native heart with stable angina pectoris (HCC) No concern for ACS at this time, continue home medication   Obesity: BMI of 39.8           DVT prophylaxis: apixaban (ELIQUIS) tablet 5 mg     Code Status: Full Code  Family Communication: None at the bedside  Patient status: Observation  Patient is from : Home  Anticipated discharge to: Home  Estimated DC date: Likely tomorrow   Consultants: None  Procedures: None  Antimicrobials:  Anti-infectives (From admission, onward)    None       Subjective:  Patient seen and examined at the bedside this morning.  Hemodynamically stable.  Lying in bed.  Looks comfortable.  Denies any lightheadedness, dizziness or room spinning around him while lying on the bed but when he walks, he feels like he will fall and has lack of balance.  Waiting for PT evaluation  Objective: Vitals:   09/11/21 2249 09/12/21 0028  09/12/21 0514 09/12/21 0731  BP: (!) 124/52 (!) 116/55 (!) 123/48 (!) 140/58  Pulse: (!) 59 62 69 72  Resp: 18 20 17 20   Temp:  98 F (36.7 C) 98 F (36.7 C) 97.8 F (36.6 C)  TempSrc:    Oral  SpO2: 95% 95% 92% 94%  Weight:      Height:        Intake/Output Summary (Last 24 hours) at 09/12/2021 1256 Last data filed at 09/12/2021 1045 Gross per 24 hour  Intake 1353.33 ml  Output 3540 ml  Net -2186.67 ml    Filed Weights   09/11/21 0813  Weight: 122.5 kg    Examination:  General exam: Overall comfortable, not in distress, obese, pleasant elderly male HEENT: PERRL Respiratory system:  no wheezes or crackles  Cardiovascular system: S1 & S2 heard, RRR.  Gastrointestinal system: Abdomen is nondistended, soft and nontender. Central nervous system: Alert and oriented Extremities: No edema, no clubbing ,no cyanosis Skin: No rashes, no ulcers,no icterus     Data Reviewed: I have personally reviewed following labs and imaging studies  CBC: Recent Labs  Lab 09/11/21 0841 09/12/21 0801  WBC 9.4 8.9  NEUTROABS  --  5.7  HGB 12.3* 13.1  HCT 38.2* 40.6  MCV 111.4* 109.7*  PLT 74* 71*   Basic Metabolic Panel: Recent Labs  Lab 09/11/21 0841  NA 135  K 4.4  CL 103  CO2 25  GLUCOSE 232*  BUN 26*  CREATININE 0.97  CALCIUM 8.7*     Recent Results (from the past 240 hour(s))  Resp Panel by RT-PCR (Flu A&B, Covid) Nasopharyngeal Swab     Status: None   Collection Time: 09/11/21  8:41 AM   Specimen: Nasopharyngeal Swab; Nasopharyngeal(NP) swabs in vial transport medium  Result Value Ref Range Status   SARS Coronavirus 2 by RT PCR NEGATIVE NEGATIVE Final    Comment: (NOTE) SARS-CoV-2 target nucleic acids are NOT DETECTED.  The SARS-CoV-2 RNA is generally detectable in upper respiratory specimens during the acute phase of infection. The lowest concentration of SARS-CoV-2 viral copies this assay can detect is 138 copies/mL. A negative result does not preclude SARS-Cov-2 infection and should not be used as the sole basis for treatment or other patient management decisions. A negative result may occur with  improper specimen collection/handling, submission of specimen other than nasopharyngeal swab, presence of viral mutation(s) within the areas targeted by this assay, and inadequate number of viral copies(<138 copies/mL). A negative result must be combined with clinical  observations, patient history, and epidemiological information. The expected result is Negative.  Fact Sheet for Patients:  EntrepreneurPulse.com.au  Fact Sheet for Healthcare Providers:  IncredibleEmployment.be  This test is no t yet approved or cleared by the [place] FDA and  has been authorized for detection and/or diagnosis of SARS-CoV-2 by FDA under an Emergency Use Authorization (EUA). This EUA will remain  in effect (meaning this test can be used) for the duration of the COVID-19 declaration under Section 564(b)(1) of the Act, 21 U.S.C.section 360bbb-3(b)(1), unless the authorization is terminated  or revoked sooner.       Influenza A by PCR NEGATIVE NEGATIVE Final   Influenza B by PCR NEGATIVE NEGATIVE Final    Comment: (NOTE) The Xpert Xpress SARS-CoV-2/FLU/RSV plus assay is intended as an aid in the diagnosis of influenza from Nasopharyngeal swab specimens and should not be used as a sole basis for treatment. Nasal washings and aspirates are unacceptable for Xpert Xpress SARS-CoV-2/FLU/RSV testing.  Fact Sheet for Patients: EntrepreneurPulse.com.au  Fact Sheet for Healthcare Providers: IncredibleEmployment.be  This test is not yet approved or cleared by the [place] FDA and has been authorized for detection and/or diagnosis of SARS-CoV-2 by FDA under an Emergency Use Authorization (EUA). This EUA will remain in effect (meaning this test can be used) for the duration of the COVID-19 declaration under Section 564(b)(1) of the Act, 21 U.S.C. section 360bbb-3(b)(1), unless the authorization is terminated or revoked.  Performed at [facility], [address] [place], [place] [address]      Radiology Studies: MR BRAIN WO CONTRAST  Result Date: 09/11/2021 CLINICAL DATA:  76 year old male with dizziness, vertigo. EXAM: MRI HEAD WITHOUT CONTRAST TECHNIQUE: Multiplanar,  multiecho pulse sequences of the brain and surrounding structures were obtained without intravenous contrast. COMPARISON:  None. FINDINGS: Brain: No restricted diffusion or evidence of acute infarction. Chronic encephalomalacia in the left posteroinferior parietal lobe, posterior left MCA territory. Minimal involvement of the left superior occipital lobe at the MCA/PCA watershed (series 15, image 28). Mild laminar necrosis. Minimal associated hemosiderin. Mild ex vacuo enlargement of the atrium of the left lateral ventricle. Occasional small areas of cortical encephalomalacia in the posterior right frontal, right parietal lobe, anterior left frontal lobe (series 15, image 43). Chronic microhemorrhage in the left temporal lobe. Chronic microhemorrhage in the left cerebellum. Tiny chronic lacunar infarct in the right caudate. Other deep gray matter nuclei, and the brainstem are within normal limits. No midline shift, mass effect, evidence of mass lesion, ventriculomegaly, extra-axial collection or acute intracranial hemorrhage. Cervicomedullary junction and pituitary are within normal limits. Vascular: Major intracranial vascular flow voids are preserved. Skull and upper cervical spine: Negative for age visible cervical spine, degenerative ligamentous hypertrophy about the odontoid. Visualized bone marrow signal is within normal limits. Sinuses/Orbits: Negative orbits. Paranasal sinuses are well aerated. Other: Only trace mastoid air cell fluid on the left appears inconsequential. Negative visible nasopharynx. Grossly normal visible internal auditory structures. Normal stylomastoid foramina. Negative visible scalp and face. IMPRESSION: 1. No acute intracranial abnormality. 2. 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 Occasional chronic cerebral micro-hemorrhages and lacunar infarcts. Electronically Signed    By: Genevie Ann M.D.   On: 09/11/2021 09:56    Scheduled Meds:  amiodarone  200 mg Oral Daily   apixaban  5 mg Oral BID   aspirin EC  81 mg Oral Daily   atorvastatin  80 mg Oral QPM   insulin aspart  0-15 Units Subcutaneous TID WC   insulin aspart  0-5 Units Subcutaneous QHS   insulin aspart  4 Units Subcutaneous TID WC   isosorbide mononitrate  15 mg Oral Daily   metoprolol succinate  25 mg Oral Daily   metoprolol succinate  50 mg Oral QHS   pantoprazole  40 mg Oral Daily   potassium citrate  10 mEq Oral BID   predniSONE  10 mg Oral Q breakfast   tamsulosin  0.4 mg Oral Daily   Continuous Infusions:  lactated ringers 100 mL/hr at 09/12/21 0404     LOS: 0 days   Shelly Coss, MD [facility] P3/08/2021, 12:56 PM

## 2021-09-12 NOTE — TOC Initial Note (Signed)
Transition of Care (TOC) - Initial/Assessment Note  ? ? ?Patient Details  ?Name: Jonathan Cross [facility] ?MRN: [id number] ?Date of Birth: September 10, 1945 ? ?Transition of Care (TOC) CM/SW Contact:    ?Pete Pelt, RN ?Phone Number: ?09/12/2021, 4:28 PM ? ?Clinical Narrative:     Patient states he lives at home with daughter, who can occasionally help with his needs.  Patient drives to his appointments and is current with all of his appointments.  He states he has no concerns about obtaining meds, and daughter sets up his pill box. ? ?At this time, patient is unsure if he would like to agree to home health.  He is uncertain if he will continue to work moving forward.  He would like to speak with the doctor about recommendations prior to deciding.    ? ?Patient has a rolling walker at home, and declines any DME at this time. ? ?TOC will follow up with patient tomorrow after he speaks to hospitalist.         ? ? ?Expected Discharge Plan:  (TBD) ?  ? ? ?Patient Goals and CMS Choice ?Patient states their goals for this hospitalization and ongoing recovery are:: To be able to go back to work if I am healthy enough ?  ?Choice offered to / list presented to : NA ? ?Expected Discharge Plan and Services ?Expected Discharge Plan:  (TBD) ?  ?Discharge Planning Services: CM Consult ?Post Acute Care Choice:  (TBD) ?Living arrangements for the past 2 months: [place] ?                ?  ?  ?  ?  ?  ?  ?  ?  ?  ?  ? ?Prior Living Arrangements/Services ?Living arrangements for the past 2 months: [place] ?Lives with:: Self, Adult Children ?Patient language and need for interpreter reviewed:: Yes (No interpreter required) ?Do you feel safe going back to the place where you live?: Yes      ?Need for Family Participation in Patient Care: Yes (Comment) ?Care giver support system in place?: Yes (comment) ?  ?Criminal Activity/Legal Involvement Pertinent to Current Situation/Hospitalization: No - Comment as  needed ? ?Activities of Daily Living ?Home Assistive Devices/Equipment: Dentures (specify type), Eyeglasses, Grab bars in shower, Grab bars around toilet ?ADL Screening (condition at time of admission) ?Patient's cognitive ability adequate to safely complete daily activities?: Yes ?Is the patient deaf or have difficulty hearing?: No ?Does the patient have difficulty seeing, even when wearing glasses/contacts?: No ?Does the patient have difficulty concentrating, remembering, or making decisions?: No ?Patient able to express need for assistance with ADLs?: Yes ?Does the patient have difficulty dressing or bathing?: No ?Independently performs ADLs?: Yes (appropriate for developmental age) ?Does the patient have difficulty walking or climbing stairs?: Yes ?Weakness of Legs: Both ?Weakness of Arms/Hands: None ? ?Permission Sought/Granted ?Permission sought to share information with : Case Manager ?Permission granted to share information with : Yes, Verbal Permission Granted ?   ?   ?   ?   ? ?Emotional Assessment ?Appearance:: Appears stated age ?Attitude/Demeanor/Rapport: Gracious, Engaged ?Affect (typically observed): Pleasant, Appropriate ?Orientation: : Oriented to Self, Oriented to Place, Oriented to  Time, Oriented to Situation ?Alcohol / Substance Use: Not Applicable ?Psych Involvement: No (comment) ? ?Admission diagnosis:  Vertigo [R42] ?Patient Active Problem List  ? Diagnosis Date Noted  ? Paroxysmal A-fib ([place]) 09/12/2021  ? Thrombocytopenia ([place]) 09/12/2021  ? Vertigo, acute CVA ruled out via  MRI, likely BPPV but assoc w/ ambulatory dysfunction and not responsive to conservative tx 09/11/2021  ? Acute on chronic heart failure with preserved ejection fraction ([place]) 06/14/2021  ? Atrial fibrillation with RVR ([place]) 06/02/2021  ? Severe sepsis ([place]) 06/02/2021  ? Infection of scalp 06/02/2021  ? Elevated troponin 06/02/2021  ? Pancytopenia ([place]) 06/02/2021  ? Cellulitis of scalp 06/02/2021  ? Tinea corporis   ?  Swelling of toe of left foot 11/02/2019  ? Aortic valve endocarditis   ? Coronary artery disease   ? Hypoalbuminemia 06/29/2019  ? Hyperglycemia 06/29/2019  ? Lactic acidosis 06/29/2019  ? GERD (gastroesophageal reflux disease) 06/29/2019  ? Staghorn renal calculus 06/29/2019  ? Nonrheumatic aortic valve stenosis 05/13/2019  ? Hyperlipidemia LDL goal <70 05/13/2019  ? Preop cardiovascular exam 05/13/2019  ? Bacteremia due to Enterococcus 04/18/2019  ? Class 2 severe obesity due to excess calories with serious comorbidity and body mass index (BMI) of 38.0 to 38.9 in adult [facility]) 03/24/2018  ? S/P aortic valve replacement 08/07/2016  ? Status post coronary artery bypass graft 08/07/2016  ? Coronary artery disease of native artery of native heart with stable angina pectoris ([place]) 02/11/2016  ? SVT (supraventricular tachycardia) ([place]) 07/31/2015  ? Type 2 diabetes mellitus without complication, with long-term current use of insulin ([place]) 03/28/2015  ? Benign prostatic hyperplasia 04/07/2014  ? OSA (obstructive sleep apnea) 04/07/2014  ? Essential hypertension 05/16/2009  ? ?PCP:  Georges Mouse, MD ?Pharmacy:   ?[facility] [place] [facility] [place] ?[address] [place]) [place] [address] ?Phone: [phone] Fax: [phone] ? ?[facility] [place], [place] [facility] ?[place] ?[place] [address] ?Phone: [phone] Fax: [phone] ? ? ? ? ?Social Determinants of Health (SDOH) Interventions ?  ? ?Readmission Risk Interventions ?Readmission Risk Prevention Plan 11/17/2019 08/16/2019 06/30/2019  ?Transportation Screening Complete Complete Complete  ?PCP or Specialist Appt within 3-5 Days - Complete -  ?[place] or Home Care Consult Complete - Patient refused  ?Social Work Consult for [place] Planning/Counseling Complete - Complete  ?Palliative Care Screening Not Applicable Not Applicable Not Applicable  ?Medication Review  Press photographer) Complete Complete Complete  ?Some recent data might be hidden  ? ? ? ?

## 2021-09-12 NOTE — Assessment & Plan Note (Signed)
Unclear etiology.  On reviewing his previous blood work, his platelets are on the lower side.  Currently stable in the range of 70s. ?

## 2021-09-12 NOTE — Evaluation (Signed)
Occupational Therapy Evaluation ?Patient Details ?Name: Jonathan Cross. ?MRN: [id number] ?DOB: 1945-07-31 ?Today's Date: 09/12/2021 ? ? ?History of Present Illness Pt is a 76 y.o. male presenting to hospital 3/1 with dizziness (spinning feeling) while laying in his bed; fell forward striking his knees on chair nearby bed when attempting to get OOB.  PMH includes a-fib, CAD, bacterial endocarditis, DM, h/o aortic valve replacement, h/o CABG, htn, PAF, and sleep apnea.  ? ?Clinical Impression ?  ?Mr. Jonathan Cross presents with generalized weakness and limited endurance. He lives in a single-story home, 3 STE, with his daughter. Pt is employed full time as a Administrator, is IND in ADL; daughter handles shopping, cooking, cleaning. Pt reports 2 falls in past 6 months (including fall on day of admission), states that he frequently has difficulty managing his blood sugar. During today's evaluation, pt denies dizziness this afternoon, although reports he was dizzy a few hours earlier. He was able to perform bed mobility, transfers, ambulation, grooming in standing, LB dressing in sitting, all with SUPV for safety but no physical assist required, no LOB observed. Provided educ re: falls prevention, home safety, DM/diet management. Pt verbalizes understanding. Pt and therapist in agreement that no further OT services required at this time. Will sign off. Please re-consult should pt's condition change.     ? ?Recommendations for follow up therapy are one component of a multi-disciplinary discharge planning process, led by the attending physician.  Recommendations may be updated based on patient status, additional functional criteria and insurance authorization.  ? ?Follow Up Recommendations ? No OT follow up  ?  ?Assistance Recommended at Discharge PRN  ?Patient can return home with the following Assistance with cooking/housework ? ?  ?Functional Status Assessment ? Patient has had a recent decline in their functional status  and demonstrates the ability to make significant improvements in function in a reasonable and predictable amount of time.  ?Equipment Recommendations ? None recommended by OT  ?  ?Recommendations for Other Services   ? ? ?  ?Precautions / Restrictions Precautions ?Precautions: Fall ?Restrictions ?Weight Bearing Restrictions: No  ? ?  ? ?Mobility Bed Mobility ?Overal bed mobility: Independent ?  ?  ?  ?  ?  ?  ?General bed mobility comments: Supine to/from sitting ?  ? ?Transfers ?Overall transfer level: Needs assistance ?Equipment used: Rolling walker (2 wheels) ?Transfers: Sit to/from Stand ?Sit to Stand: Supervision ?  ?  ?  ?  ?  ?General transfer comment: close SUPV for safety, no LOB observed ?  ? ?  ?Balance Overall balance assessment: Needs assistance ?Sitting-balance support: No upper extremity supported, Feet supported ?Sitting balance-Leahy Scale: Good ?Sitting balance - Comments: steady sitting reaching outside BOS ?  ?Standing balance support: Single extremity supported, Bilateral upper extremity supported, No upper extremity supported ?Standing balance-Leahy Scale: Good ?Standing balance comment: able to engage in standing fxl mobility tasks w/ no LOB, w/ and w/o UE support, denies dizziness ?  ?  ?  ?  ?  ?  ?  ?  ?  ?  ?  ?   ? ?ADL either performed or assessed with clinical judgement  ? ?ADL Overall ADL's : Needs assistance/impaired ?Eating/Feeding: Independent ?  ?Grooming: Wash/dry hands;Wash/dry face;Standing ?Grooming Details (indicate cue type and reason): standing at sink, no UE support ?  ?  ?  ?  ?  ?  ?Lower Body Dressing: Modified independent ?Lower Body Dressing Details (indicate cue type and reason): donning/doffing socks, in sitting ?  ?  ?  Toileting- Clothing Manipulation and Hygiene: Modified independent ?Toileting - Clothing Manipulation Details (indicate cue type and reason): using urinal, in sitting ?  ?  ?  ?   ? ? ? ?Vision Ability to See in Adequate Light: 2 Moderately  impaired ?Patient Visual Report: No change from baseline;Peripheral vision impairment ?Additional Comments: Partial vision loss in L eye 5 weeks ago, for which pt is currently seeing ophthalmologist  ?   ?Perception   ?  ?Praxis   ?  ? ?Pertinent Vitals/Pain Pain Assessment ?Pain Assessment: No/denies pain  ? ? ? ?Hand Dominance   ?  ?Extremity/Trunk Assessment Upper Extremity Assessment ?Upper Extremity Assessment: Overall WFL for tasks assessed ?  ?Lower Extremity Assessment ?Lower Extremity Assessment: Overall WFL for tasks assessed ?  ?Cervical / Trunk Assessment ?Cervical / Trunk Assessment: Normal ?  ?Communication Communication ?Communication: No difficulties ?  ?Cognition Arousal/Alertness: Awake/alert ?Behavior During Therapy: [facility] for tasks assessed/performed ?Overall Cognitive Status: Within Functional Limits for tasks assessed ?  ?  ?  ?  ?  ?  ?  ?  ?  ?  ?  ?  ?  ?  ?  ?  ?  ?  ?  ?General Comments  pt reports decreased L eye vision about 4-5 weeks ago (pt also reports he was told he had "mini strokes" 2-3 months ago base on imaging) ? ?  ?Exercises Other Exercises ?Other Exercises: Educ re: falls prevention, monitoring DM, dietary precautions ?  ?Shoulder Instructions    ? ? ?Home Living Family/patient expects to be discharged to:: Private residence ?Living Arrangements: Children ?Available Help at Discharge: Family ?Type of Home: House ?Home Access: Stairs to enter ?Entrance Stairs-Number of Steps: 3 ?Entrance Stairs-Rails: Right;Left;Can reach both ?Home Layout: One level ?  ?  ?Bathroom Shower/Tub: Walk-in shower ?  ?Bathroom Toilet: Standard ?Bathroom Accessibility: No ?  ?Home Equipment: Cane - single point;Grab bars - tub/shower;Rolling Walker (2 wheels);Shower seat ?  ?  ?  ? ?  ?Prior Functioning/Environment Prior Level of Function : Independent/Modified Independent;Working/employed ?  ?  ?  ?  ?  ?  ?Mobility Comments: Walks without AD; reports 2 falls in previous 6 months ?ADLs Comments: IND  in ADL. Lives with daughter, who handles shopping, cooking, cleaning ?  ? ?  ?  ?OT Problem List: Decreased activity tolerance;Decreased coordination;Obesity;Impaired balance (sitting and/or standing) ?  ?   ?OT Treatment/Interventions:    ?  ?OT Goals(Current goals can be found in the care plan section) Acute Rehab OT Goals ?Patient Stated Goal: to get back to work ?OT Goal Formulation: With patient ?Time For Goal Achievement: 09/26/21 ?Potential to Achieve Goals: Good  ?OT Frequency:   ?  ? ?Co-evaluation   ?  ?  ?  ?  ? ?  ?AM-PAC OT "6 Clicks" Daily Activity     ?Outcome Measure Help from another person eating meals?: None ?Help from another person taking care of personal grooming?: None ?Help from another person toileting, which includes using toliet, bedpan, or urinal?: None ?Help from another person bathing (including washing, rinsing, drying)?: None ?Help from another person to put on and taking off regular upper body clothing?: None ?Help from another person to put on and taking off regular lower body clothing?: None ?6 Click Score: 24 ?  ?End of Session Equipment Utilized During Treatment: Rolling walker (2 wheels) ? ?Activity Tolerance: Patient tolerated treatment well ?Patient left: in chair;with call bell/phone within reach;with chair alarm set ? ?OT Visit Diagnosis:  Unsteadiness on feet (R26.81);Dizziness and giddiness (R42)  ?              ?Time: 4497-5300 ?OT Time Calculation (min): 22 min ?Charges:  OT General Charges ?$OT Visit: 1 Visit ?OT Evaluation ?$OT Eval Low Complexity: 1 Low ?OT Treatments ?$Self Care/Home Management : 8-22 mins ?Josiah Lobo, PhD, MS, OTR/L ?09/12/21, 2:11 PM ? ?

## 2021-09-12 NOTE — Hospital Course (Signed)
Patient is a 76 year old male with history of coronary disease status post CABG, hyperlipidemia diabetes type 2, hypertension, paroxysmal A-fib, sleep apnea who presented to the emergency department from home with complaints of dizziness.  He described the dizziness as room is spinning around him.  This was triggered when he was on his bed.  He could not walk and felt like he will fall.  He had an episode like this in the past.  Patient was tried to be discharged from ED but could not be discharged because of risk for fall.  PT consulted.  Started on Valium for vertigo. ?

## 2021-09-12 NOTE — Assessment & Plan Note (Signed)
Monitor blood pressure.  Currently above stable.  Continue home medications. ?

## 2021-09-13 DIAGNOSIS — R42 Dizziness and giddiness: Secondary | ICD-10-CM | POA: Diagnosis not present

## 2021-09-13 LAB — GLUCOSE, CAPILLARY
Glucose-Capillary: 213 mg/dL — ABNORMAL HIGH (ref 70–99)
Glucose-Capillary: 375 mg/dL — ABNORMAL HIGH (ref 70–99)

## 2021-09-13 MED ORDER — PREDNISONE 10 MG PO TABS: 10.0000 mg | ORAL_TABLET | Freq: Every day | ORAL | 0 refills | Status: AC

## 2021-09-13 MED ORDER — MECLIZINE HCL 25 MG PO TABS: 25.0000 mg | ORAL_TABLET | Freq: Three times a day (TID) | ORAL | 0 refills | Status: DC | PRN

## 2021-09-13 NOTE — Progress Notes (Addendum)
Inpatient Diabetes Program Recommendations ? ?AACE/ADA: New Consensus Statement on Inpatient Glycemic Control (2015) ? ?Target Ranges:  Prepandial:   less than 140 mg/dL ?     Peak postprandial:   less than 180 mg/dL (1-2 hours) ?     Critically ill patients:  140 - 180 mg/dL  ? ? Latest Reference Range & Units 09/12/21 07:32 09/12/21 12:31 09/12/21 16:04 09/12/21 20:37  ?Glucose-Capillary 70 - 99 mg/dL 153 (H) ? ?7 units Novolog ? 298 (H) ? ?12 units Novolog ? ?REFUSED Semglee ? 311 (H) ? ?15 units Novolog ? 263 (H) ? ?3 units Novolog ?  ?(H): Data is abnormally high ? Latest Reference Range & Units 09/13/21 09:07  ?Glucose-Capillary 70 - 99 mg/dL 213 (H) ? ?9 units Novolog ? ?30 units Semglee  ?(H): Data is abnormally high ? Latest Reference Range & Units 06/02/21 16:00 09/11/21 08:41  ?Hemoglobin A1C 4.8 - 5.6 % 8.4 (H) 9.8 (H) ? ?(235 mg/dl)  ?(H): Data is abnormally high ? ? ? ?Admit with: Vertigo ? ?History: DM2 ? ?Home DM Meds: 70/30 Insulin 120 units BID ? ?Current Orders: Semglee 30 units Daily ?     Novolog Moderate Correction Scale/ SSI (0-15 units) TID AC + HS ?          Novolog 4 units TID with meals ? ? ?Seen by PCP 07/31/2021 (Dr. Hardin Negus with [facility]) ?A1c was down to 8.7% at that visit ?Was told to take 70/30 Insulin 130 units BID ? ?MD- please note that pt REFUSED Semglee insulin yesterday 03/02. ? ?Did agree to take Semglee insulin this AM ? ? ? ?Addendum 12pm--Met w/ pt at bedside.  Pt up in chair eating lunch and waiting for rise home.  Lives with daughter but administers his Insulin injections himself.  Takes 70/30 Insulin 130 units BID with meals.  Remembers to rotate injection sites regularly on his abdomen.  Has meds and CBG meter and supplies at home.  Reviewed current A1c of 9.8% with pt--pt told me he has been having numerous dietary indiscretions over the past month and attributes his elevated A1c to this.  Asked pt if he had any questions about diet and nutrition and pt stated  "No"--He also stated to me he knows how to eat and just needs to do better. ? ? ? ?--Will follow patient during hospitalization-- ? ?Wyn Quaker RN, MSN, CDE ?Diabetes Coordinator ?Inpatient Glycemic Control Team ?Team Pager: [phone] (8a-5p) ? ? ?

## 2021-09-13 NOTE — Progress Notes (Signed)
Physical Therapy Treatment ?Patient Details ?Name: [facility]. ?MRN: [id number] ?DOB: 10/21/1945 ?Today's Date: 09/13/2021 ? ? ?History of Present Illness Pt is a 76 y.o. male presenting to hospital 3/1 with dizziness (spinning feeling) while laying in his bed; fell forward striking his knees on chair nearby bed when attempting to get OOB.  PMH includes a-fib, CAD, bacterial endocarditis, DM, h/o aortic valve replacement, h/o CABG, htn, PAF, and sleep apnea. ? ?  ?PT Comments  ? ? Pt sitting in recliner upon PT arrival; agreeable to PT session.  Pt reports no dizziness symptoms since vestibular treatment yesterday (pt reports feeling a lot better today).  During session pt modified independent with transfers and CGA progressing to SBA ambulating 200 feet with RW (occasional vc's to stay closer to RW).  Mild SOB noted (pt reports having this SOB past 4-5 years); O2 sats 94% or greater on room air during sessions activities.  No c/o pain.  Will continue to focus on strengthening, balance, and progressive functional mobility during hospitalization. ?  ?Recommendations for follow up therapy are one component of a multi-disciplinary discharge planning process, led by the attending physician.  Recommendations may be updated based on patient status, additional functional criteria and insurance authorization. ? ?Follow Up Recommendations ? Home health PT ?  ?  ?Assistance Recommended at Discharge Intermittent Supervision/Assistance  ?Patient can return home with the following Assistance with cooking/housework;Assist for transportation;Help with stairs or ramp for entrance ?  ?Equipment Recommendations ? Rolling walker (2 wheels);BSC/3in1  ?  ?Recommendations for Other Services   ? ? ?  ?Precautions / Restrictions Precautions ?Precautions: Fall ?Restrictions ?Weight Bearing Restrictions: No  ?  ? ?Mobility ? Bed Mobility ?  ?  ?  ?  ?  ?  ?  ?General bed mobility comments: Deferred (pt in recliner beginning/end of  session) ?  ? ?Transfers ?Overall transfer level: Modified independent ?Equipment used: Rolling walker (2 wheels) ?Transfers: Sit to/from Stand ?Sit to Stand: Modified independent (Device/Increase time) ?  ?  ?  ?  ?  ?General transfer comment: steady safe transfers with UE support (x2 trials from recliner with RW use; x1 trial from toilet with grab bar use) ?  ? ?Ambulation/Gait ?Ambulation/Gait assistance: Min guard, Supervision ?Gait Distance (Feet):  (200 feet with RW; 20 feet x2 (to/from bathroom) with UE support on IV pole) ?Assistive device: Rolling walker (2 wheels) ?  ?Gait velocity: decreased ?  ?  ?General Gait Details: occasional vc's to stay closer to RW; steady with RW use; decreased B LE step length noted ? ? ?Stairs ?Stairs:  (pt declined--pt reports having ramp (with railing) to enter home and will use this upon discharge home) ?  ?  ?  ?  ? ? ?Wheelchair Mobility ?  ? ?Modified Rankin (Stroke Patients Only) ?  ? ? ?  ?Balance Overall balance assessment: Needs assistance ?Sitting-balance support: No upper extremity supported, Feet supported ?Sitting balance-Leahy Scale: Normal ?Sitting balance - Comments: steady sitting reaching outside BOS ?  ?Standing balance support: No upper extremity supported ?Standing balance-Leahy Scale: Good ?Standing balance comment: steady standing washing hands at sink ?  ?  ?  ?  ?  ?  ?  ?  ?  ?  ?  ?  ? ?  ?Cognition Arousal/Alertness: Awake/alert ?Behavior During Therapy: [facility] for tasks assessed/performed ?Overall Cognitive Status: Within Functional Limits for tasks assessed ?  ?  ?  ?  ?  ?  ?  ?  ?  ?  ?  ?  ?  ?  ?  ?  ?  ?  ?  ? ?  ?  Exercises   ? ?  ?General Comments  Pt agreeable to PT session. ?  ?  ? ?Pertinent Vitals/Pain Pain Assessment ?Pain Assessment: No/denies pain ?HR WFL during sessions activities.  ? ? ?Home Living   ?  ?  ?  ?  ?  ?  ?  ?  ?  ?   ?  ?Prior Function    ?  ?  ?   ? ?PT Goals (current goals can now be found in the care plan section)  Acute Rehab PT Goals ?Patient Stated Goal: to improve dizziness and strength ?PT Goal Formulation: With patient ?Time For Goal Achievement: 09/26/21 ?Progress towards PT goals: Progressing toward goals ? ?  ?Frequency ? ? ? Min 2X/week ? ? ? ?  ?PT Plan Current plan remains appropriate  ? ? ?Co-evaluation   ?  ?  ?  ?  ? ?  ?AM-PAC PT "6 Clicks" Mobility   ?Outcome Measure ? Help needed turning from your back to your side while in a flat bed without using bedrails?: None ?Help needed moving from lying on your back to sitting on the side of a flat bed without using bedrails?: None ?Help needed moving to and from a bed to a chair (including a wheelchair)?: None ?Help needed standing up from a chair using your arms (e.g., wheelchair or bedside chair)?: None ?Help needed to walk in hospital room?: A Little ?Help needed climbing 3-5 steps with a railing? : A Little ?6 Click Score: 22 ? ?  ?End of Session Equipment Utilized During Treatment: Gait belt ?Activity Tolerance: Patient tolerated treatment well ?Patient left: in chair;with call bell/phone within reach;with chair alarm set ?Nurse Communication: Mobility status;Precautions ?PT Visit Diagnosis: Unsteadiness on feet (R26.81);Other abnormalities of gait and mobility (R26.89);Muscle weakness (generalized) (M62.81);History of falling (Z91.81);BPPV;Dizziness and giddiness (R42) ?  ? ? ?Time: 5277-8242 ?PT Time Calculation (min) (ACUTE ONLY): 27 min ? ?Charges:  $Gait Training: 8-22 mins ?$Therapeutic Activity: 8-22 mins          ?          ?Leitha Bleak, PT ?09/13/21, 10:20 AM ? ? ?

## 2021-09-13 NOTE — Discharge Summary (Addendum)
Physician Discharge Summary  Jonathan Cross. ZOX:[id number] DOB: July 03, 1946 DOA: 09/11/2021  PCP: Georges Mouse, MD  Admit date: 09/11/2021 Discharge date: 09/13/2021  Admitted From: Home Disposition:  Home  Discharge Condition:Stable CODE STATUS:FULL Diet recommendation: Heart Healthy  Brief/Interim Summary:  Patient is a 76 year old male with history of coronary disease status post CABG, hyperlipidemia diabetes type 2, hypertension, paroxysmal A-fib, sleep apnea who presented to the emergency department from home with complaints of dizziness.  He described the dizziness as room is spinning around him.  This was triggered when he was on his bed.  He could not walk and felt like he will fall.  He had an episode like this in the past.  Patient was tried to be discharged from ED but could not be discharged because of risk for fall.  PT consulted.  After evaluation by PT, he underwent vestibular therapy with significantly improved his vertigo.  This morning he denies any vertigo or dizziness.  He is medically stable for discharge to home today.  PT recommended home health on discharge.  Following problems were addressed during this hospitalization:  Vertigo: MRI of the brain did not show any acute intracranial abnormalities.  But showed chronic lacunar infarcts. Underwent vestibular therapy with PT, with resolution of vertigo.  Continue meclizine as needed if vertigo course at home.  We recommend to follow-up with ENT as an outpatient if further recurrence of vertigo in the future.   Thrombocytopenia ([place]) Unclear etiology.  On reviewing his previous blood work, his platelets are on the lower side.  Monitor as an outpatient   Paroxysmal A-fib (HCC) Currently normal sinus rhythm.  On Eliquis for anticoagulation.  On metoprolol for rate control   Hyperlipidemia LDL goal <70 Takes Lipitor 80 mg daily   Type 2 diabetes mellitus without complication, with long-term current use of  insulin ([place]) Continue home regimen on discharge.A1c of 9.8 . 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  He was  recommended for 2 weeks but he was still taking it which may have contributed to hyperglycemia.  We will taper the steroid.  I have discussed with son and he is calling his ophthalmologist today to find out the duration and to know whether it should be continued or not.  Essential hypertension Monitor blood pressure.  Continue home medications.   Coronary artery disease of native artery of native heart with stable angina pectoris (HCC) No concern for ACS at this time, continue home medications  Morbid obesity: BMI of 39.8    Discharge Diagnoses:  Principal Problem:   Vertigo, acute CVA ruled out via MRI, likely BPPV but assoc w/ ambulatory dysfunction and not responsive to conservative tx Active Problems:   Coronary artery disease of native artery of native heart with stable angina pectoris ([place])   Essential hypertension   Type 2 diabetes mellitus without complication, with long-term current use of insulin (HCC)   Hyperlipidemia LDL goal <70   Paroxysmal A-fib ([place])   Thrombocytopenia ([place])    Discharge Instructions  Discharge Instructions     Diet - low sodium heart healthy   Complete by: As directed    Discharge instructions   Complete by: As directed    1)Please take prescribed medications as instructed 2)Follow up with your PCP in a week 3)Follow up with your ophthalmologist 4)Follow up with ENT as an outpatient if you develop any vertigo   Increase activity  slowly   Complete by: As directed       Allergies as of 09/13/2021       Reactions   Metformin Diarrhea   Loose stools even with XR        Medication List     TAKE these medications    amiodarone 200 MG tablet Commonly known as: PACERONE Take 1 tablet (200 mg total) by mouth daily.    aspirin EC 81 MG tablet Take 81 mg by mouth daily.   atorvastatin 80 MG tablet Commonly known as: LIPITOR Take 80 mg by mouth every evening.   Eliquis 5 MG Tabs tablet Generic drug: apixaban Take 1 tablet (5 mg total) by mouth 2 (two) times daily.   insulin NPH-regular Human (70-30) 100 UNIT/ML injection Inject 120 Units into the skin 2 (two) times daily.   isosorbide mononitrate 30 MG 24 hr tablet Commonly known as: IMDUR Take 1 tablet (30 mg total) by mouth daily. What changed: how much to take   loratadine 10 MG tablet Commonly known as: CLARITIN Take 10 mg by mouth daily.   meclizine 25 MG tablet Commonly known as: ANTIVERT Take 1 tablet (25 mg total) by mouth 3 (three) times daily as needed for dizziness.   metoprolol succinate 25 MG 24 hr tablet Commonly known as: TOPROL-XL Take 25-50 mg by mouth See admin instructions. Take 1 tablet (25mg ) by mouth every morning and take 2 tablets (50mg ) by mouth every evening   nitroGLYCERIN 0.4 MG SL tablet Commonly known as: NITROSTAT Place 0.4 mg under the tongue every 5 (five) minutes x 3 doses as needed for chest pain.   omeprazole 20 MG capsule Commonly known as: PRILOSEC Take 40 mg by mouth daily.   Potassium Citrate 15 MEQ (1620 MG) Tbcr Take 2 tablets by mouth 2 (two) times daily. What changed: how much to take   predniSONE 10 MG tablet Commonly known as: DELTASONE Take 1 tablet (10 mg total) by mouth daily with breakfast for 1 day. Start taking on: September 14, 2021 What changed:  medication strength how much to take when to take this   tamsulosin 0.4 MG Caps capsule Commonly known as: FLOMAX Take 0.4 mg by mouth daily.        Allergies  Allergen Reactions   Metformin Diarrhea    Loose stools even with XR    Consultations: None   Procedures/Studies: MR BRAIN WO CONTRAST  Result Date: 09/11/2021 CLINICAL DATA:  76 year old male with dizziness, vertigo. EXAM: MRI HEAD WITHOUT CONTRAST TECHNIQUE:  Multiplanar, multiecho pulse sequences of the brain and surrounding structures were obtained without intravenous contrast. COMPARISON:  None. FINDINGS: Brain: No restricted diffusion or evidence of acute infarction. Chronic encephalomalacia in the left posteroinferior parietal lobe, posterior left MCA territory. Minimal involvement of the left superior occipital lobe at the MCA/PCA watershed (series 15, image 28). Mild laminar necrosis. Minimal associated hemosiderin. Mild ex vacuo enlargement of the atrium of the left lateral ventricle. Occasional small areas of cortical encephalomalacia in the posterior right frontal, right parietal lobe, anterior left frontal lobe (series 15, image 43). Chronic microhemorrhage in the left temporal lobe. Chronic microhemorrhage in the left cerebellum. Tiny chronic lacunar infarct in the right caudate. Other deep gray matter nuclei, and the brainstem are within normal limits. No midline shift, mass effect, evidence of mass lesion, ventriculomegaly, extra-axial collection or acute intracranial hemorrhage. Cervicomedullary junction and pituitary are within normal limits. Vascular: Major intracranial vascular flow voids are preserved. Skull and upper cervical spine:  Negative for age visible cervical spine, degenerative ligamentous hypertrophy about the odontoid. Visualized bone marrow signal is within normal limits. Sinuses/Orbits: Negative orbits. Paranasal sinuses are well aerated. Other: Only trace mastoid air cell fluid on the left appears inconsequential. Negative visible nasopharynx. Grossly normal visible internal auditory structures. Normal stylomastoid foramina. Negative visible scalp and face. IMPRESSION: 1. No acute intracranial abnormality. 2. 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 Occasional chronic cerebral micro-hemorrhages and lacunar infarcts.  Electronically Signed   By: Genevie Ann M.D.   On: 09/11/2021 09:56      Subjective: Patient seen and examined at the bedside this morning.  Hemodynamically stable for discharge today.I called his son and updated about the dc planning  Discharge Exam: Vitals:   09/13/21 0601 09/13/21 0800  BP: 119/62 (!) 176/82  Pulse: 66 65  Resp: 18 17  Temp: 98.2 F (36.8 C) (!) 97.5 F (36.4 C)  SpO2: 97% 97%   Vitals:   09/12/21 1959 09/13/21 0042 09/13/21 0601 09/13/21 0800  BP: (!) 155/66 (!) 151/57 119/62 (!) 176/82  Pulse: 73 72 66 65  Resp: 19 19 18 17   Temp: 98.2 F (36.8 C) 98.1 F (36.7 C) 98.2 F (36.8 C) (!) 97.5 F (36.4 C)  TempSrc:    Oral  SpO2: 95% 93% 97% 97%  Weight:      Height:        General: Pt is alert, awake, not in acute distress, morbidly obese Cardiovascular: RRR, S1/S2 +, no rubs, no gallops Respiratory: CTA bilaterally, no wheezing, no rhonchi Abdominal: Soft, NT, ND, bowel sounds + Extremities: no edema, no cyanosis    The results of significant diagnostics from this hospitalization (including imaging, microbiology, ancillary and laboratory) are listed below for reference.     Microbiology: Recent Results (from the past 240 hour(s))  Resp Panel by RT-PCR (Flu A&B, Covid) Nasopharyngeal Swab     Status: None   Collection Time: 09/11/21  8:41 AM   Specimen: Nasopharyngeal Swab; Nasopharyngeal(NP) swabs in vial transport medium  Result Value Ref Range Status   SARS Coronavirus 2 by RT PCR NEGATIVE NEGATIVE Final    Comment: (NOTE) SARS-CoV-2 target nucleic acids are NOT DETECTED.  The SARS-CoV-2 RNA is generally detectable in upper respiratory specimens during the acute phase of infection. The lowest concentration of SARS-CoV-2 viral copies this assay can detect is 138 copies/mL. A negative result does not preclude SARS-Cov-2 infection and should not be used as the sole basis for treatment or other patient management decisions. A negative result  may occur with  improper specimen collection/handling, submission of specimen other than nasopharyngeal swab, presence of viral mutation(s) within the areas targeted by this assay, and inadequate number of viral copies(<138 copies/mL). A negative result must be combined with clinical observations, patient history, and epidemiological information. The expected result is Negative.  Fact Sheet for Patients:  EntrepreneurPulse.com.au  Fact Sheet for Healthcare Providers:  IncredibleEmployment.be  This test is no t yet approved or cleared by the [place] FDA and  has been authorized for detection and/or diagnosis of SARS-CoV-2 by FDA under an Emergency Use Authorization (EUA). This EUA will remain  in effect (meaning this test can be used) for the duration of the COVID-19 declaration under Section 564(b)(1) of the Act, 21 U.S.C.section 360bbb-3(b)(1), unless the authorization is terminated  or revoked sooner.       Influenza A by PCR NEGATIVE NEGATIVE  Final   Influenza B by PCR NEGATIVE NEGATIVE Final    Comment: (NOTE) The Xpert Xpress SARS-CoV-2/FLU/RSV plus assay is intended as an aid in the diagnosis of influenza from Nasopharyngeal swab specimens and should not be used as a sole basis for treatment. Nasal washings and aspirates are unacceptable for Xpert Xpress SARS-CoV-2/FLU/RSV testing.  Fact Sheet for Patients: EntrepreneurPulse.com.au  Fact Sheet for Healthcare Providers: IncredibleEmployment.be  This test is not yet approved or cleared by the [place] FDA and has been authorized for detection and/or diagnosis of SARS-CoV-2 by FDA under an Emergency Use Authorization (EUA). This EUA will remain in effect (meaning this test can be used) for the duration of the COVID-19 declaration under Section 564(b)(1) of the Act, 21 U.S.C. section 360bbb-3(b)(1), unless the authorization is terminated  or revoked.  Performed at [facility], [address] [place], [place] [address]      Labs: BNP (last 3 results) Recent Labs    03/02/21 1132 06/02/21 0927  BNP 40.4 27.0   Basic Metabolic Panel: Recent Labs  Lab 09/11/21 0841  NA 135  K 4.4  CL 103  CO2 25  GLUCOSE 232*  BUN 26*  CREATININE 0.97  CALCIUM 8.7*   Liver Function Tests: No results for input(s): AST, ALT, ALKPHOS, BILITOT, PROT, ALBUMIN in the last 168 hours. No results for input(s): LIPASE, AMYLASE in the last 168 hours. No results for input(s): AMMONIA in the last 168 hours. CBC: Recent Labs  Lab 09/11/21 0841 09/12/21 0801  WBC 9.4 8.9  NEUTROABS  --  5.7  HGB 12.3* 13.1  HCT 38.2* 40.6  MCV 111.4* 109.7*  PLT 74* 71*   Cardiac Enzymes: No results for input(s): CKTOTAL, CKMB, CKMBINDEX, TROPONINI in the last 168 hours. BNP: Invalid input(s): POCBNP CBG: Recent Labs  Lab 09/12/21 0732 09/12/21 1231 09/12/21 1604 09/12/21 2037 09/13/21 0907  GLUCAP 153* 298* 311* 263* 213*   D-Dimer No results for input(s): DDIMER in the last 72 hours. Hgb A1c Recent Labs    09/11/21 0841  HGBA1C 9.8*   Lipid Profile No results for input(s): CHOL, HDL, LDLCALC, TRIG, CHOLHDL, LDLDIRECT in the last 72 hours. Thyroid function studies No results for input(s): TSH, T4TOTAL, T3FREE, THYROIDAB in the last 72 hours.  Invalid input(s): FREET3 Anemia work up No results for input(s): VITAMINB12, FOLATE, FERRITIN, TIBC, IRON, RETICCTPCT in the last 72 hours. Urinalysis    Component Value Date/Time   COLORURINE YELLOW (A) 09/11/2021 0906   APPEARANCEUR CLEAR (A) 09/11/2021 0906   APPEARANCEUR Cloudy (A) 06/07/2019 1409   LABSPEC 1.019 09/11/2021 0906   PHURINE 5.0 09/11/2021 0906   GLUCOSEU 150 (A) 09/11/2021 0906   HGBUR SMALL (A) 09/11/2021 0906   BILIRUBINUR NEGATIVE 09/11/2021 0906   BILIRUBINUR Negative 06/07/2019 1409   KETONESUR NEGATIVE 09/11/2021 0906   PROTEINUR NEGATIVE  09/11/2021 0906   NITRITE NEGATIVE 09/11/2021 0906   LEUKOCYTESUR NEGATIVE 09/11/2021 0906   Sepsis Labs Invalid input(s): PROCALCITONIN,  WBC,  LACTICIDVEN Microbiology Recent Results (from the past 240 hour(s))  Resp Panel by RT-PCR (Flu A&B, Covid) Nasopharyngeal Swab     Status: None   Collection Time: 09/11/21  8:41 AM   Specimen: Nasopharyngeal Swab; Nasopharyngeal(NP) swabs in vial transport medium  Result Value Ref Range Status   SARS Coronavirus 2 by RT PCR NEGATIVE NEGATIVE Final    Comment: (NOTE) SARS-CoV-2 target nucleic acids are NOT DETECTED.  The SARS-CoV-2 RNA is generally detectable in upper respiratory specimens during the acute phase  of infection. The lowest concentration of SARS-CoV-2 viral copies this assay can detect is 138 copies/mL. A negative result does not preclude SARS-Cov-2 infection and should not be used as the sole basis for treatment or other patient management decisions. A negative result may occur with  improper specimen collection/handling, submission of specimen other than nasopharyngeal swab, presence of viral mutation(s) within the areas targeted by this assay, and inadequate number of viral copies(<138 copies/mL). A negative result must be combined with clinical observations, patient history, and epidemiological information. The expected result is Negative.  Fact Sheet for Patients:  EntrepreneurPulse.com.au  Fact Sheet for Healthcare Providers:  IncredibleEmployment.be  This test is no t yet approved or cleared by the [place] FDA and  has been authorized for detection and/or diagnosis of SARS-CoV-2 by FDA under an Emergency Use Authorization (EUA). This EUA will remain  in effect (meaning this test can be used) for the duration of the COVID-19 declaration under Section 564(b)(1) of the Act, 21 U.S.C.section 360bbb-3(b)(1), unless the authorization is terminated  or revoked sooner.        Influenza A by PCR NEGATIVE NEGATIVE Final   Influenza B by PCR NEGATIVE NEGATIVE Final    Comment: (NOTE) The Xpert Xpress SARS-CoV-2/FLU/RSV plus assay is intended as an aid in the diagnosis of influenza from Nasopharyngeal swab specimens and should not be used as a sole basis for treatment. Nasal washings and aspirates are unacceptable for Xpert Xpress SARS-CoV-2/FLU/RSV testing.  Fact Sheet for Patients: EntrepreneurPulse.com.au  Fact Sheet for Healthcare Providers: IncredibleEmployment.be  This test is not yet approved or cleared by the [place] FDA and has been authorized for detection and/or diagnosis of SARS-CoV-2 by FDA under an Emergency Use Authorization (EUA). This EUA will remain in effect (meaning this test can be used) for the duration of the COVID-19 declaration under Section 564(b)(1) of the Act, 21 U.S.C. section 360bbb-3(b)(1), unless the authorization is terminated or revoked.  Performed at [facility], [address], [place], [place] [address]     Please note: You were cared for by a hospitalist during your hospital stay. Once you are discharged, your primary care physician will handle any further medical issues. Please note that NO REFILLS for any discharge medications will be authorized once you are discharged, as it is imperative that you return to your primary care physician (or establish a relationship with a primary care physician if you do not have one) for your post hospital discharge needs so that they can reassess your need for medications and monitor your lab values.    Time coordinating discharge: 40 minutes  SIGNED:   Shelly Coss, MD  [facility] 09/13/2021, 10:52 AM Pager [phone]  If 7PM-7AM, please contact night-coverage [email or web address] Password [email or web address]

## 2021-09-27 ENCOUNTER — Encounter (INDEPENDENT_AMBULATORY_CARE_PROVIDER_SITE_OTHER): Payer: Medicare HMO | Admitting: Nurse Practitioner

## 2021-09-30 ENCOUNTER — Encounter (INDEPENDENT_AMBULATORY_CARE_PROVIDER_SITE_OTHER): Payer: Self-pay | Admitting: Nurse Practitioner

## 2021-09-30 ENCOUNTER — Other Ambulatory Visit: Payer: Self-pay

## 2021-09-30 ENCOUNTER — Ambulatory Visit (INDEPENDENT_AMBULATORY_CARE_PROVIDER_SITE_OTHER): Payer: Medicare HMO | Admitting: Nurse Practitioner

## 2021-09-30 VITALS — BP 119/64 | HR 97 | Ht 69.0 in | Wt 267.0 lb

## 2021-09-30 DIAGNOSIS — H539 Unspecified visual disturbance: Secondary | ICD-10-CM

## 2021-10-01 ENCOUNTER — Encounter (INDEPENDENT_AMBULATORY_CARE_PROVIDER_SITE_OTHER): Payer: Self-pay | Admitting: Nurse Practitioner

## 2021-10-01 NOTE — Progress Notes (Signed)
? ?Subjective:  ? ? Patient ID: Jonathan Cross., male    DOB: 07/03/1946, 76 y.o.   MRN: [id number] ?Chief Complaint  ?Patient presents with  ? Establish Care  ?  Referrred by Dr Wallace Going for temporal artery biopsy  ? ? ?Jonathan Cross is a 76 year old male that presents today as a referral from Dr. Wallace Going with concern for possible temporal arteritis.  Initially the patient had an appointment scheduled on 08/27/2021 however he no showed due to some issues with dizziness.  He was also hospitalized with dizziness but he notes that after some head adjustments and some questionable adjustment the dizziness is greatly reduced.  He still continues to have some issues with balance however.  He notes that shortly he sometime in January he went to bed with normal vision and woke up with the top portion of his vision missing.  He notes that it is somewhat opened up but has not completely improved.  He denies any headache.  He denies any jaw or occipital pain.  He did have some mildly elevated CRP.  During his work-up for dizziness at the hospital it does not appear that a carotid duplex or CT angiogram of the neck was performed. ? ? ?Review of Systems  ?Eyes:  Positive for visual disturbance.  ?Musculoskeletal:  Positive for gait problem.  ?All other systems reviewed and are negative. ? ?   ?Objective:  ? Physical Exam ?Vitals reviewed.  ?HENT:  ?   Head: Normocephalic.  ?Neck:  ?   Vascular: No carotid bruit.  ?Cardiovascular:  ?   Rate and Rhythm: Normal rate and regular rhythm.  ?   Heart sounds: Murmur heard.  ?Pulmonary:  ?   Effort: Pulmonary effort is normal.  ?Skin: ?   General: Skin is warm and dry.  ?Neurological:  ?   Mental Status: He is alert.  ?   Motor: Weakness present.  ?   Gait: Gait abnormal.  ?Psychiatric:     ?   Mood and Affect: Mood normal.     ?   Behavior: Behavior normal.     ?   Thought Content: Thought content normal.     ?   Judgment: Judgment normal.  ? ? ?BP 119/64 (BP Location: Left  Arm)   Pulse 97   Ht '5\' 9"'$  (1.753 m)   Wt 267 lb (121.1 kg)   SpO2 (!) 18%   BMI 39.43 kg/m?  ? ?Past Medical History:  ?Diagnosis Date  ? 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 Coronary artery disease   ? a. 01/2016 CABG x 2: LIMA->LAD, VG->OM; b. 05/2021 MV: small, mild, rev apical alteral and apical inf defects ->subtle ischemia vs artifact, EF 55-65%-->low risk.  ? Diabetes ([place])   ? H/O aortic valve replacement   ? a. 01/2016 s/p bioprosthetic AVR @ [facility] for Severe AS; b. 05/2021 Echo: EF 55-60%, no rwma, mild LVH, nl RV fxn, nl fxn of bioprosthetic AVR.  ? History of kidney stones   ? Hx of CABG   ? Hyperlipidemia   ? Hypertension   ? PAF (paroxysmal atrial fibrillation) ([place])   ? a. CHA2DS2VASc = 5-->eliquis/amio.  ? Sleep apnea   ? ? ?Social History  ? ?Socioeconomic History  ? Marital status: Married  ?  Spouse name: Not on file  ? Number of children: Not on file  ? Years of education: Not on file  ? Highest education level: Not on file  ?Occupational History  ? Not on  file  ?Tobacco Use  ? Smoking status: Former  ? Smokeless tobacco: Never  ?Vaping Use  ? Vaping Use: Never used  ?Substance and Sexual Activity  ? Alcohol use: Never  ? Drug use: Never  ? Sexual activity: Not on file  ?Other Topics Concern  ? Not on file  ?Social History Narrative  ? Not on file  ? ?Social Determinants of Health  ? ?Financial Resource Strain: Not on file  ?Food Insecurity: Not on file  ?Transportation Needs: Not on file  ?Physical Activity: Not on file  ?Stress: Not on file  ?Social Connections: Not on file  ?Intimate Partner Violence: Not on file  ? ? ?Past Surgical History:  ?Procedure Laterality Date  ? AORTIC VALVE REPLACEMENT  2017  ? [facility], bioprosthetic  ? APPENDECTOMY    ? CARDIAC VALVE REPLACEMENT    ? CORONARY ARTERY BYPASS GRAFT  2017  ? [facility] - LIMA-LAD and SVG-OM  ? CYSTOSCOPY W/ RETROGRADES Right 05/27/2019  ? Procedure: CYSTOSCOPY WITH RETROGRADE PYELOGRAM;  Surgeon: Billey Co, MD;  Location: [facility];   Service: Urology;  Laterality: Right;  ? CYSTOSCOPY W/ RETROGRADES Right 07/11/2019  ? Procedure: CYSTOSCOPY WITH RETROGRADE PYELOGRAM;  Surgeon: Billey Co, MD;  Location: [facility];  Service: Urology;  Laterality: Right;  ? CYSTOSCOPY/URETEROSCOPY/HOLMIUM LASER/STENT PLACEMENT Right 05/27/2019  ? Procedure: CYSTOSCOPY/URETEROSCOPY/HOLMIUM LASER/STENT PLACEMENT;  Surgeon: Billey Co, MD;  Location: [facility];  Service: Urology;  Laterality: Right;  ? CYSTOSCOPY/URETEROSCOPY/HOLMIUM LASER/STENT PLACEMENT Right 06/17/2019  ? Procedure: CYSTOSCOPY/URETEROSCOPY/HOLMIUM LASER/STENT Exchange;  Surgeon: Billey Co, MD;  Location: [facility];  Service: Urology;  Laterality: Right;  ? CYSTOSCOPY/URETEROSCOPY/HOLMIUM LASER/STENT PLACEMENT Right 07/11/2019  ? Procedure: CYSTOSCOPY/URETEROSCOPY/HOLMIUM LASER/STENT Exchange;  Surgeon: Billey Co, MD;  Location: [facility];  Service: Urology;  Laterality: Right;  ? STONE EXTRACTION WITH BASKET Right 07/11/2019  ? Procedure: STONE EXTRACTION WITH BASKET;  Surgeon: Billey Co, MD;  Location: [facility];  Service: Urology;  Laterality: Right;  ? TEE WITHOUT CARDIOVERSION N/A 04/21/2019  ? Procedure: TRANSESOPHAGEAL ECHOCARDIOGRAM (TEE);  Surgeon: Minna Merritts, MD;  Location: [facility];  Service: Cardiovascular;  Laterality: N/A;  ? TEE WITHOUT CARDIOVERSION N/A 07/04/2019  ? Procedure: TRANSESOPHAGEAL ECHOCARDIOGRAM (TEE);  Surgeon: Wellington Hampshire, MD;  Location: [facility];  Service: Cardiovascular;  Laterality: N/A;  ? TEE WITHOUT CARDIOVERSION N/A 08/17/2019  ? Procedure: TRANSESOPHAGEAL ECHOCARDIOGRAM (TEE);  Surgeon: Kate Sable, MD;  Location: [facility];  Service: Cardiovascular;  Laterality: N/A;  ? TEE WITHOUT CARDIOVERSION N/A 11/15/2019  ? Procedure: TRANSESOPHAGEAL ECHOCARDIOGRAM (TEE);  Surgeon: Nelva Bush, MD;  Location: [facility];  Service: Cardiovascular;  Laterality: N/A;  ? TONSILLECTOMY    ? ? ?Family History  ?Problem Relation Age  of Onset  ? Heart attack Father 79  ? Heart attack Sister   ? ? ?Allergies  ?Allergen Reactions  ? Metformin Diarrhea  ?  Loose stools even with XR  ? ? ?CBC Latest Ref Rng & Units 09/12/2021 09/11/2021 08/06/2021  ?WBC 4.0 - 10.5 K/uL 8.9 9.4 3.4(L)  ?Hemoglobin 13.0 - 17.0 g/dL 13.1 12.3(L) 9.8(L)  ?Hematocrit 39.0 - 52.0 % 40.6 38.2(L) 30.1(L)  ?Platelets 150 - 400 K/uL 71(L) 74(L) 113(L)  ? ? ? ? ?CMP  ?   ?Component Value Date/Time  ? NA 135 09/11/2021 0841  ? NA 136 07/10/2021 1556  ? K 4.4 09/11/2021 0841  ? CL 103 09/11/2021 0841  ? CO2 25 09/11/2021 0841  ? GLUCOSE 232 (H) 09/11/2021 0841  ?  BUN 26 (H) 09/11/2021 0841  ? BUN 19 07/10/2021 1556  ? CREATININE 0.97 09/11/2021 0841  ? CALCIUM 8.7 (L) 09/11/2021 0841  ? PROT 7.4 06/02/2021 0926  ? ALBUMIN 3.4 (L) 06/02/2021 0926  ? AST 33 06/02/2021 0926  ? ALT 40 06/02/2021 0926  ? ALKPHOS 56 06/02/2021 0926  ? BILITOT 0.7 06/02/2021 0926  ? GFRNONAA >60 09/11/2021 0841  ? GFRAA >60 11/16/2019 0502  ? ? ? ?No results found. ? ?   ?Assessment & Plan:  ? ?1. Visual disturbances ?The patient's symptoms are very concerning for amaurosis fugax, especially given his history of coronary artery disease.  I have her return for a carotid duplex to ensure that there is no carotid stenosis possibly causing the effects.  If not we can move forward with a temporal artery biopsy. ? ? ?Current Outpatient Medications on File Prior to Visit  ?Medication Sig Dispense Refill  ? amiodarone (PACERONE) 200 MG tablet Take 1 tablet (200 mg total) by mouth daily. 90 tablet 1  ? aspirin EC 81 MG tablet Take 81 mg by mouth daily.    ? atorvastatin (LIPITOR) 80 MG tablet Take 80 mg by mouth every evening.     ? ELIQUIS 5 MG TABS tablet Take 1 tablet (5 mg total) by mouth 2 (two) times daily. 180 tablet 3  ? insulin NPH-regular Human (70-30) 100 UNIT/ML injection Inject 120 Units into the skin 2 (two) times daily.    ? isosorbide mononitrate (IMDUR) 30 MG 24 hr tablet Take 1 tablet (30 mg total)  by mouth daily. (Patient taking differently: Take 15 mg by mouth daily.) 30 tablet 3  ? loratadine (CLARITIN) 10 MG tablet Take 10 mg by mouth daily.    ? meclizine (ANTIVERT) 25 MG tablet Take 1 tablet (25 mg tota

## 2021-10-14 ENCOUNTER — Ambulatory Visit (INDEPENDENT_AMBULATORY_CARE_PROVIDER_SITE_OTHER): Payer: Medicare HMO

## 2021-10-14 ENCOUNTER — Ambulatory Visit (INDEPENDENT_AMBULATORY_CARE_PROVIDER_SITE_OTHER): Payer: Medicare HMO | Admitting: Nurse Practitioner

## 2021-10-14 ENCOUNTER — Other Ambulatory Visit (INDEPENDENT_AMBULATORY_CARE_PROVIDER_SITE_OTHER): Payer: Self-pay | Admitting: Vascular Surgery

## 2021-10-14 VITALS — BP 128/71 | HR 76

## 2021-10-14 DIAGNOSIS — R42 Dizziness and giddiness: Secondary | ICD-10-CM

## 2021-10-14 DIAGNOSIS — H539 Unspecified visual disturbance: Secondary | ICD-10-CM | POA: Diagnosis not present

## 2021-10-18 ENCOUNTER — Other Ambulatory Visit: Payer: Self-pay | Admitting: Urology

## 2021-10-19 ENCOUNTER — Encounter (INDEPENDENT_AMBULATORY_CARE_PROVIDER_SITE_OTHER): Payer: Self-pay | Admitting: Nurse Practitioner

## 2021-10-19 NOTE — Progress Notes (Signed)
? ?Subjective:  ? ? Patient ID: Jonathan Cross., male    DOB: April 08, 1946, 76 y.o.   MRN: [id number] ?Chief Complaint  ?Patient presents with  ? Follow-up  ?  ultrasound  ? ? ? Jonathan Cross is a 76 year old male that presents today as a referral from Dr. Wallace Going with concern for possible temporal arteritis.  Initially the patient had an appointment scheduled on 08/27/2021 however he no showed due to some issues with dizziness.  He was also hospitalized with dizziness but he notes that after some head adjustments and some questionable adjustment the dizziness is greatly reduced.  He still continues to have some issues with balance however.  He notes that shortly he sometime in January he went to bed with normal vision and woke up with the top portion of his vision missing.  He notes that it is somewhat opened up but has not completely improved.  He denies any headache.  He denies any jaw or occipital pain.  He did have some mildly elevated CRP.  During his work-up for dizziness at the hospital it does not appear that a carotid duplex or CT angiogram of the neck was performed. ? ?Today we had the patient undergo carotid duplex which shows near normal with only minimal wall thickening bilaterally.  The bilateral vertebral arteries have antegrade flow with normal flow hemodynamics in the bilateral subclavian arteries. ? ? ?Review of Systems  ?Eyes:  Positive for visual disturbance.  ?All other systems reviewed and are negative. ? ?   ?Objective:  ? Physical Exam ?Vitals reviewed.  ?HENT:  ?   Head: Normocephalic.  ?Cardiovascular:  ?   Rate and Rhythm: Normal rate and regular rhythm.  ?   Pulses: Normal pulses.  ?Pulmonary:  ?   Effort: Pulmonary effort is normal.  ?Skin: ?   General: Skin is warm and dry.  ?Neurological:  ?   Mental Status: He is alert and oriented to person, place, and time.  ?Psychiatric:     ?   Mood and Affect: Mood normal.     ?   Behavior: Behavior normal.     ?   Thought Content: Thought  content normal.     ?   Judgment: Judgment normal.  ? ? ?BP 128/71 (BP Location: Right Arm)   Pulse 76  ? ?Past Medical History:  ?Diagnosis Date  ? 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 Coronary artery disease   ? a. 01/2016 CABG x 2: LIMA->LAD, VG->OM; b. 05/2021 MV: small, mild, rev apical alteral and apical inf defects ->subtle ischemia vs artifact, EF 55-65%-->low risk.  ? Diabetes ([place])   ? H/O aortic valve replacement   ? a. 01/2016 s/p bioprosthetic AVR @ [facility] for Severe AS; b. 05/2021 Echo: EF 55-60%, no rwma, mild LVH, nl RV fxn, nl fxn of bioprosthetic AVR.  ? History of kidney stones   ? Hx of CABG   ? Hyperlipidemia   ? Hypertension   ? PAF (paroxysmal atrial fibrillation) ([place])   ? a. CHA2DS2VASc = 5-->eliquis/amio.  ? Sleep apnea   ? ? ?Social History  ? ?Socioeconomic History  ? Marital status: Married  ?  Spouse name: Not on file  ? Number of children: Not on file  ? Years of education: Not on file  ? Highest education level: Not on file  ?Occupational History  ? Not on file  ?Tobacco Use  ? Smoking status: Former  ? Smokeless tobacco: Never  ?Vaping Use  ? Vaping  Use: Never used  ?Substance and Sexual Activity  ? Alcohol use: Never  ? Drug use: Never  ? Sexual activity: Not on file  ?Other Topics Concern  ? Not on file  ?Social History Narrative  ? Not on file  ? ?Social Determinants of Health  ? ?Financial Resource Strain: Not on file  ?Food Insecurity: Not on file  ?Transportation Needs: Not on file  ?Physical Activity: Not on file  ?Stress: Not on file  ?Social Connections: Not on file  ?Intimate Partner Violence: Not on file  ? ? ?Past Surgical History:  ?Procedure Laterality Date  ? AORTIC VALVE REPLACEMENT  2017  ? [facility], bioprosthetic  ? APPENDECTOMY    ? CARDIAC VALVE REPLACEMENT    ? CORONARY ARTERY BYPASS GRAFT  2017  ? [facility] - LIMA-LAD and SVG-OM  ? CYSTOSCOPY W/ RETROGRADES Right 05/27/2019  ? Procedure: CYSTOSCOPY WITH RETROGRADE PYELOGRAM;  Surgeon: Billey Co, MD;  Location: [facility];   Service: Urology;  Laterality: Right;  ? CYSTOSCOPY W/ RETROGRADES Right 07/11/2019  ? Procedure: CYSTOSCOPY WITH RETROGRADE PYELOGRAM;  Surgeon: Billey Co, MD;  Location: [facility];  Service: Urology;  Laterality: Right;  ? CYSTOSCOPY/URETEROSCOPY/HOLMIUM LASER/STENT PLACEMENT Right 05/27/2019  ? Procedure: CYSTOSCOPY/URETEROSCOPY/HOLMIUM LASER/STENT PLACEMENT;  Surgeon: Billey Co, MD;  Location: [facility];  Service: Urology;  Laterality: Right;  ? CYSTOSCOPY/URETEROSCOPY/HOLMIUM LASER/STENT PLACEMENT Right 06/17/2019  ? Procedure: CYSTOSCOPY/URETEROSCOPY/HOLMIUM LASER/STENT Exchange;  Surgeon: Billey Co, MD;  Location: [facility];  Service: Urology;  Laterality: Right;  ? CYSTOSCOPY/URETEROSCOPY/HOLMIUM LASER/STENT PLACEMENT Right 07/11/2019  ? Procedure: CYSTOSCOPY/URETEROSCOPY/HOLMIUM LASER/STENT Exchange;  Surgeon: Billey Co, MD;  Location: [facility];  Service: Urology;  Laterality: Right;  ? STONE EXTRACTION WITH BASKET Right 07/11/2019  ? Procedure: STONE EXTRACTION WITH BASKET;  Surgeon: Billey Co, MD;  Location: [facility];  Service: Urology;  Laterality: Right;  ? TEE WITHOUT CARDIOVERSION N/A 04/21/2019  ? Procedure: TRANSESOPHAGEAL ECHOCARDIOGRAM (TEE);  Surgeon: Minna Merritts, MD;  Location: [facility];  Service: Cardiovascular;  Laterality: N/A;  ? TEE WITHOUT CARDIOVERSION N/A 07/04/2019  ? Procedure: TRANSESOPHAGEAL ECHOCARDIOGRAM (TEE);  Surgeon: Wellington Hampshire, MD;  Location: [facility];  Service: Cardiovascular;  Laterality: N/A;  ? TEE WITHOUT CARDIOVERSION N/A 08/17/2019  ? Procedure: TRANSESOPHAGEAL ECHOCARDIOGRAM (TEE);  Surgeon: Kate Sable, MD;  Location: [facility];  Service: Cardiovascular;  Laterality: N/A;  ? TEE WITHOUT CARDIOVERSION N/A 11/15/2019  ? Procedure: TRANSESOPHAGEAL ECHOCARDIOGRAM (TEE);  Surgeon: Nelva Bush, MD;  Location: [facility];  Service: Cardiovascular;  Laterality: N/A;  ? TONSILLECTOMY    ? ? ?Family History  ?Problem Relation Age  of Onset  ? Heart attack Father 53  ? Heart attack Sister   ? ? ?Allergies  ?Allergen Reactions  ? Metformin Diarrhea  ?  Loose stools even with XR  ? ? ? ?  Latest Ref Rng & Units 09/12/2021  ?  8:01 AM 09/11/2021  ?  8:41 AM 08/06/2021  ?  3:19 PM  ?CBC  ?WBC 4.0 - 10.5 K/uL 8.9   9.4   3.4    ?Hemoglobin 13.0 - 17.0 g/dL 13.1   12.3   9.8    ?Hematocrit 39.0 - 52.0 % 40.6   38.2   30.1    ?Platelets 150 - 400 K/uL 71   74   113    ? ? ? ? ?CMP  ?   ?Component Value Date/Time  ? NA 135 09/11/2021 0841  ? NA 136 07/10/2021 1556  ?  K 4.4 09/11/2021 0841  ? CL 103 09/11/2021 0841  ? CO2 25 09/11/2021 0841  ? GLUCOSE 232 (H) 09/11/2021 0841  ? BUN 26 (H) 09/11/2021 0841  ? BUN 19 07/10/2021 1556  ? CREATININE 0.97 09/11/2021 0841  ? CALCIUM 8.7 (L) 09/11/2021 0841  ? PROT 7.4 06/02/2021 0926  ? ALBUMIN 3.4 (L) 06/02/2021 0926  ? AST 33 06/02/2021 0926  ? ALT 40 06/02/2021 0926  ? ALKPHOS 56 06/02/2021 0926  ? BILITOT 0.7 06/02/2021 0926  ? GFRNONAA >60 09/11/2021 0841  ? GFRAA >60 11/16/2019 0502  ? ? ? ?No results found. ? ?   ?Assessment & Plan:  ? ?1. Visual disturbances ?The patient has no evidence of significant carotid stenosis.  I also discussed the possibility of temporal arteritis with the patient as well as biopsy and how this is considered the gold standard.  Patient is not on steroids to possibly cause false negatives.  Following discussion the patient does not wish to move forward with biopsy of temporal artery.  Patient advised to contact our office if he indeed decides that he wants to proceed with biopsy.  Otherwise we will have the patient follow-up with [place] as needed. ? ? ?Current Outpatient Medications on File Prior to Visit  ?Medication Sig Dispense Refill  ? amiodarone (PACERONE) 200 MG tablet Take 1 tablet (200 mg total) by mouth daily. 90 tablet 1  ? aspirin EC 81 MG tablet Take 81 mg by mouth daily.    ? atorvastatin (LIPITOR) 80 MG tablet Take 80 mg by mouth every evening.     ? ELIQUIS 5 MG TABS  tablet Take 1 tablet (5 mg total) by mouth 2 (two) times daily. 180 tablet 3  ? insulin NPH-regular Human (70-30) 100 UNIT/ML injection Inject 120 Units into the skin 2 (two) times daily.    ? isosorbide m

## 2021-10-25 ENCOUNTER — Encounter: Payer: Self-pay | Admitting: Nurse Practitioner

## 2021-10-25 ENCOUNTER — Ambulatory Visit (INDEPENDENT_AMBULATORY_CARE_PROVIDER_SITE_OTHER): Payer: Medicare HMO | Admitting: Nurse Practitioner

## 2021-10-25 VITALS — BP 118/50 | HR 78 | Ht 69.0 in | Wt 272.2 lb

## 2021-10-25 DIAGNOSIS — Z952 Presence of prosthetic heart valve: Secondary | ICD-10-CM

## 2021-10-25 DIAGNOSIS — I480 Paroxysmal atrial fibrillation: Secondary | ICD-10-CM

## 2021-10-25 DIAGNOSIS — I5033 Acute on chronic diastolic (congestive) heart failure: Secondary | ICD-10-CM

## 2021-10-25 DIAGNOSIS — Z79899 Other long term (current) drug therapy: Secondary | ICD-10-CM

## 2021-10-25 DIAGNOSIS — I25118 Atherosclerotic heart disease of native coronary artery with other forms of angina pectoris: Secondary | ICD-10-CM

## 2021-10-25 DIAGNOSIS — E785 Hyperlipidemia, unspecified: Secondary | ICD-10-CM

## 2021-10-25 DIAGNOSIS — Z5181 Encounter for therapeutic drug level monitoring: Secondary | ICD-10-CM

## 2021-10-25 DIAGNOSIS — I10 Essential (primary) hypertension: Secondary | ICD-10-CM

## 2021-10-25 MED ORDER — FUROSEMIDE 40 MG PO TABS: 40.0000 mg | ORAL_TABLET | Freq: Every day | ORAL | 3 refills | Status: DC

## 2021-10-25 NOTE — Patient Instructions (Signed)
Medication Instructions:  ?Your physician has recommended you make the following change in your medication:  ? ?START Furosemide (Lasix) 40 mg once a day ? ?*If you need a refill on your cardiac medications before your next appointment, please call your pharmacy* ? ? ?Lab Work: ?CMET, Mag & TSH today ? ?If you have labs (blood work) drawn today and your tests are completely normal, you will receive your results only by: ?MyChart Message (if you have MyChart) OR ?A paper copy in the mail ?If you have any lab test that is abnormal or we need to change your treatment, we will call you to review the results. ? ? ?Testing/Procedures: ?None ? ? ?Follow-Up: ?At [facility], you and your health needs are our priority.  As part of our continuing mission to provide you with exceptional heart care, we have created designated Provider Care Teams.  These Care Teams include your primary Cardiologist (physician) and Advanced Practice Providers (APPs -  Physician Assistants and Nurse Practitioners) who all work together to provide you with the care you need, when you need it. ? ? ?Your next appointment:   ?1 week(s) ? ?Next Friday 11/01/21 at 2:45 pm ? ?The format for your next appointment:   ?In Person ? ?Provider:   ?Murray Hodgkins, NP  ? ? ? ? ?Important Information About Sugar ? ? ? ? ?  ?

## 2021-10-25 NOTE — Progress Notes (Signed)
? ? ?Office Visit  ?  ?Patient Name: Jonathan Cross [facility]. ?Date of Encounter: 10/25/2021 ? ?Primary Care Provider:  Georges Mouse, MD ?Primary Cardiologist:  Nelva Bush, MD ? ?Chief Complaint  ?  ?76 year old male with a history of CAD status post CABG x2 (LIMA to the LAD, vein graft to the obtuse marginal), severe aortic stenosis status post bioprosthetic aortic valve replacement complicated by endocarditis, paroxysmal atrial fibrillation, hypertension, hyperlipidemia, HFpEF, type 2 diabetes mellitus, sleep apnea, and BPH, who presents for follow-up related to ongoing dyspnea and exertional c/p. ? ?Past Medical History  ?  ?Past Medical History:  ?Diagnosis Date  ? 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 Coronary artery disease   ? a. 01/2016 CABG x 2: LIMA->LAD, VG->OM; b. 05/2021 MV: small, mild, rev apical lateral and apical inf defects ->subtle ischemia vs artifact, EF 55-65%-->low risk.  ? Diabetes ([place])   ? H/O aortic valve replacement   ? a. 01/2016 s/p bioprosthetic AVR @ [facility] for Severe AS; b. 05/2021 Echo: EF 55-60%, no rwma, mild LVH, nl RV fxn, nl fxn of bioprosthetic AVR.  ? History of kidney stones   ? Hx of CABG   ? Hyperlipidemia   ? Hypertension   ? PAF (paroxysmal atrial fibrillation) ([place])   ? a. CHA2DS2VASc = 5-->eliquis/amio.  ? Sleep apnea   ? ?Past Surgical History:  ?Procedure Laterality Date  ? AORTIC VALVE REPLACEMENT  2017  ? [facility], bioprosthetic  ? APPENDECTOMY    ? CARDIAC VALVE REPLACEMENT    ? CORONARY ARTERY BYPASS GRAFT  2017  ? [facility] - LIMA-LAD and SVG-OM  ? CYSTOSCOPY W/ RETROGRADES Right 05/27/2019  ? Procedure: CYSTOSCOPY WITH RETROGRADE PYELOGRAM;  Surgeon: Billey Co, MD;  Location: [facility];  Service: Urology;  Laterality: Right;  ? CYSTOSCOPY W/ RETROGRADES Right 07/11/2019  ? Procedure: CYSTOSCOPY WITH RETROGRADE PYELOGRAM;  Surgeon: Billey Co, MD;  Location: [facility];  Service: Urology;  Laterality: Right;  ? CYSTOSCOPY/URETEROSCOPY/HOLMIUM LASER/STENT  PLACEMENT Right 05/27/2019  ? Procedure: CYSTOSCOPY/URETEROSCOPY/HOLMIUM LASER/STENT PLACEMENT;  Surgeon: Billey Co, MD;  Location: [facility];  Service: Urology;  Laterality: Right;  ? CYSTOSCOPY/URETEROSCOPY/HOLMIUM LASER/STENT PLACEMENT Right 06/17/2019  ? Procedure: CYSTOSCOPY/URETEROSCOPY/HOLMIUM LASER/STENT Exchange;  Surgeon: Billey Co, MD;  Location: [facility];  Service: Urology;  Laterality: Right;  ? CYSTOSCOPY/URETEROSCOPY/HOLMIUM LASER/STENT PLACEMENT Right 07/11/2019  ? Procedure: CYSTOSCOPY/URETEROSCOPY/HOLMIUM LASER/STENT Exchange;  Surgeon: Billey Co, MD;  Location: [facility];  Service: Urology;  Laterality: Right;  ? STONE EXTRACTION WITH BASKET Right 07/11/2019  ? Procedure: STONE EXTRACTION WITH BASKET;  Surgeon: Billey Co, MD;  Location: [facility];  Service: Urology;  Laterality: Right;  ? TEE WITHOUT CARDIOVERSION N/A 04/21/2019  ? Procedure: TRANSESOPHAGEAL ECHOCARDIOGRAM (TEE);  Surgeon: Minna Merritts, MD;  Location: [facility];  Service: Cardiovascular;  Laterality: N/A;  ? TEE WITHOUT CARDIOVERSION N/A 07/04/2019  ? Procedure: TRANSESOPHAGEAL ECHOCARDIOGRAM (TEE);  Surgeon: Wellington Hampshire, MD;  Location: [facility];  Service: Cardiovascular;  Laterality: N/A;  ? TEE WITHOUT CARDIOVERSION N/A 08/17/2019  ? Procedure: TRANSESOPHAGEAL ECHOCARDIOGRAM (TEE);  Surgeon: Kate Sable, MD;  Location: [facility];  Service: Cardiovascular;  Laterality: N/A;  ? TEE WITHOUT CARDIOVERSION N/A 11/15/2019  ? Procedure: TRANSESOPHAGEAL ECHOCARDIOGRAM (TEE);  Surgeon: Nelva Bush, MD;  Location: [facility];  Service: Cardiovascular;  Laterality: N/A;  ? TONSILLECTOMY    ? ? ?Allergies ? ?Allergies  ?Allergen Reactions  ? Metformin Diarrhea  ?  Loose stools even with XR  ? ? ?History of  Present Illness  ?  ?76 year old male with the above past medical history including CAD status post CABG x2, severe aortic stenosis status post bioprosthetic aortic valve replacement, bacterial  endocarditis, paroxysmal atrial fibrillation, hypertension, hyperlipidemia, type 2 diabetes mellitus, sleep apnea, and BPH.  He was previously followed at [facility] in the setting of severe aortic stenosis, underwent bioprosthetic aortic valve replacement and two-vessel coronary bypass grafting in July 2017.  Between October 2020 and May 2021, he had multiple admissions related to fever, malaise, and Enterococcus faecalis bacteremia.  Transesophageal echocardiograms in October 2020 and December 2020, were negative for vegetation.  In February thousand 21, he was found to have a mobile mass on his prosthetic aortic valve, and was seen by infectious disease and treated with appropriate antibiotics for 6 weeks in the outpatient setting.  Prior to PICC line removal, repeat TEE in May 2021 continued to show an echodensity that appeared less mobile but larger, measuring 1.1 x 0.6 cm.  Perivalvular abscess could not be excluded.  He was transferred to [facility] where cardiac CT showed evidence of a small mass in the leaflet of the noncoronary cusp, likely representing a healed vegetation.  Leaflets were freely movable without evidence of obstruction.  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  The LIMA to the LAD and vein graft to the obtuse marginal were patent.  He was seen by CT surgery who recommended continued observation and antibiotic therapy. ? ?In November 2022, he was admitted secondary to dyspnea and chest fullness.  He was found to be in rapid atrial fibrillation and was initially treated with IV diltiazem resulting in hypotension and requiring discontinuation.  He was then loaded with amiodarone and converted to sinus rhythm.  He did have chest pain at the time of admission and stress testing was undertaken and showed a small, mild, reversible apical lateral and apical inferior defect, with question of subtle ischemia versus artifact.   Ultimately, this was felt to be a low risk study and he was medically managed.  He subsequently transition his care to Dr. Saunders Revel, and was last seen in cardiology clinic in late December 2022, at which time he was maintaining sinus rhythm.  He continues report dyspnea on exertion and occasional chest pain and his isosorbide mononitrate was titrated to 30 mg daily with consideration to be given to right and left heart catheterization for recurrent/ongoing symptoms. ? ?Since his last clinic visit, he has continued to experience exertional dyspnea and chest discomfort.  The dyspnea has progressed, and he feels that it has been exacerbated by nearly 30 pound weight gain in the setting of prednisone therapy for visual disturbance throughout March.  He now becomes dyspneic with walking 10 to 20 feet, and was quite dyspneic walking in from his car today.  He also continues to experience left-sided chest discomfort, which he identifies as mild-2/10, and similar to what has been experiencing dating back to surgery in 2017.  He does not think he ever increase his isosorbide following his last visit, but he is not sure why.  He has had mild lower extremity swelling.  He denies palpitations, PND, orthopnea, syncope, or early satiety.  He does sometimes experience lightheadedness when standing up too quickly. ? ?Home Medications  ?  ?Current Outpatient Medications  ?Medication Sig Dispense Refill  ? amiodarone (PACERONE) 200 MG tablet Take 1 tablet (200 mg total) by mouth daily. 90 tablet 1  ?  aspirin EC 81 MG tablet Take 81 mg by mouth daily.    ? atorvastatin (LIPITOR) 80 MG tablet Take 80 mg by mouth every evening.     ? ELIQUIS 5 MG TABS tablet Take 1 tablet (5 mg total) by mouth 2 (two) times daily. 180 tablet 3  ? furosemide (LASIX) 40 MG tablet Take 1 tablet (40 mg total) by mouth daily. 90 tablet 3  ? insulin NPH-regular Human (70-30) 100 UNIT/ML injection Inject 120 Units into the skin 2 (two) times daily.    ? isosorbide  mononitrate (IMDUR) 30 MG 24 hr tablet Take 15 mg by mouth daily.    ? loratadine (CLARITIN) 10 MG tablet Take 10 mg by mouth daily.    ? meclizine (ANTIVERT) 25 MG tablet Take 1 tablet (25 mg total) by mouth 3 (

## 2021-10-26 LAB — COMPREHENSIVE METABOLIC PANEL
ALT: 26 IU/L (ref 0–44)
AST: 20 IU/L (ref 0–40)
Albumin/Globulin Ratio: 1.5 (ref 1.2–2.2)
Albumin: 3.8 g/dL (ref 3.7–4.7)
Alkaline Phosphatase: 61 IU/L (ref 44–121)
BUN/Creatinine Ratio: 11 (ref 10–24)
BUN: 11 mg/dL (ref 8–27)
Bilirubin Total: 0.4 mg/dL (ref 0.0–1.2)
CO2: 23 mmol/L (ref 20–29)
Calcium: 8.6 mg/dL (ref 8.6–10.2)
Chloride: 103 mmol/L (ref 96–106)
Creatinine, Ser: 1 mg/dL (ref 0.76–1.27)
Globulin, Total: 2.6 g/dL (ref 1.5–4.5)
Glucose: 307 mg/dL — ABNORMAL HIGH (ref 70–99)
Potassium: 4 mmol/L (ref 3.5–5.2)
Sodium: 140 mmol/L (ref 134–144)
Total Protein: 6.4 g/dL (ref 6.0–8.5)
eGFR: 77 mL/min/{1.73_m2} (ref >59)

## 2021-10-26 LAB — MAGNESIUM: Magnesium: 1.8 mg/dL (ref 1.6–2.3)

## 2021-10-26 LAB — TSH: TSH: 3.61 u[IU]/mL (ref 0.450–4.500)

## 2021-10-28 ENCOUNTER — Other Ambulatory Visit: Payer: Self-pay | Admitting: *Deleted

## 2021-10-28 MED ORDER — MAGNESIUM OXIDE 400 MG PO CAPS: 400.0000 mg | ORAL_CAPSULE | Freq: Every day | ORAL | 3 refills | Status: DC

## 2021-10-29 ENCOUNTER — Telehealth: Payer: Self-pay | Admitting: *Deleted

## 2021-10-29 NOTE — Telephone Encounter (Signed)
-----   Message from Theora Gianotti, NP sent at 10/26/2021  9:10 AM EDT ----- ?Glucose high @ 307.  Liver and kidney function, electrolytes, and TSH look good.  Magnesium is 1.8.  With history of prolonged QT and ongoing amiodarone therapy, I recommend that he begin Mag-ox '400mg'$  daily. ?

## 2021-10-29 NOTE — Telephone Encounter (Signed)
No answer/No voicemail box set up.  

## 2021-11-01 ENCOUNTER — Other Ambulatory Visit: Payer: Self-pay

## 2021-11-01 ENCOUNTER — Ambulatory Visit (INDEPENDENT_AMBULATORY_CARE_PROVIDER_SITE_OTHER): Payer: Medicare HMO | Admitting: Nurse Practitioner

## 2021-11-01 ENCOUNTER — Encounter: Payer: Self-pay | Admitting: Nurse Practitioner

## 2021-11-01 ENCOUNTER — Emergency Department
Admission: EM | Admit: 2021-11-01 | Discharge: 2021-11-01 | Disposition: A | Discharge status: Home / Self Care | Payer: Medicare HMO | Attending: Emergency Medicine | Admitting: Emergency Medicine

## 2021-11-01 ENCOUNTER — Emergency Department: Payer: Medicare HMO

## 2021-11-01 VITALS — BP 110/44 | HR 75 | Ht 69.0 in | Wt 270.5 lb

## 2021-11-01 DIAGNOSIS — I25118 Atherosclerotic heart disease of native coronary artery with other forms of angina pectoris: Secondary | ICD-10-CM

## 2021-11-01 DIAGNOSIS — Z79899 Other long term (current) drug therapy: Secondary | ICD-10-CM | POA: Diagnosis not present

## 2021-11-01 DIAGNOSIS — I10 Essential (primary) hypertension: Secondary | ICD-10-CM

## 2021-11-01 DIAGNOSIS — Z20822 Contact with and (suspected) exposure to covid-19: Secondary | ICD-10-CM | POA: Insufficient documentation

## 2021-11-01 DIAGNOSIS — I480 Paroxysmal atrial fibrillation: Secondary | ICD-10-CM

## 2021-11-01 DIAGNOSIS — E119 Type 2 diabetes mellitus without complications: Secondary | ICD-10-CM | POA: Insufficient documentation

## 2021-11-01 DIAGNOSIS — Z951 Presence of aortocoronary bypass graft: Secondary | ICD-10-CM | POA: Insufficient documentation

## 2021-11-01 DIAGNOSIS — Z952 Presence of prosthetic heart valve: Secondary | ICD-10-CM

## 2021-11-01 DIAGNOSIS — R42 Dizziness and giddiness: Secondary | ICD-10-CM | POA: Insufficient documentation

## 2021-11-01 DIAGNOSIS — I5033 Acute on chronic diastolic (congestive) heart failure: Secondary | ICD-10-CM | POA: Diagnosis not present

## 2021-11-01 DIAGNOSIS — E860 Dehydration: Secondary | ICD-10-CM | POA: Diagnosis not present

## 2021-11-01 DIAGNOSIS — I2510 Atherosclerotic heart disease of native coronary artery without angina pectoris: Secondary | ICD-10-CM | POA: Insufficient documentation

## 2021-11-01 DIAGNOSIS — E785 Hyperlipidemia, unspecified: Secondary | ICD-10-CM

## 2021-11-01 LAB — URINALYSIS, ROUTINE W REFLEX MICROSCOPIC
Bilirubin Urine: NEGATIVE
Glucose, UA: >=500 mg/dL — AB
Hgb urine dipstick: NEGATIVE
Ketones, ur: NEGATIVE mg/dL
Leukocytes,Ua: NEGATIVE
Nitrite: NEGATIVE
Protein, ur: NEGATIVE mg/dL
Specific Gravity, Urine: 1.021 (ref 1.005–1.030)
pH: 5 (ref 5.0–8.0)

## 2021-11-01 LAB — BASIC METABOLIC PANEL
Anion gap: 10 (ref 5–15)
BUN: 17 mg/dL (ref 8–23)
CO2: 23 mmol/L (ref 22–32)
Calcium: 9 mg/dL (ref 8.9–10.3)
Chloride: 102 mmol/L (ref 98–111)
Creatinine, Ser: 1.06 mg/dL (ref 0.61–1.24)
GFR, Estimated: >60 mL/min (ref >60)
Glucose, Bld: 307 mg/dL — ABNORMAL HIGH (ref 70–99)
Potassium: 3.7 mmol/L (ref 3.5–5.1)
Sodium: 135 mmol/L (ref 135–145)

## 2021-11-01 LAB — LIPASE, BLOOD: Lipase: 32 U/L (ref 11–51)

## 2021-11-01 LAB — CBC
HCT: 32.4 % — ABNORMAL LOW (ref 39.0–52.0)
Hemoglobin: 10.4 g/dL — ABNORMAL LOW (ref 13.0–17.0)
MCH: 35.4 pg — ABNORMAL HIGH (ref 26.0–34.0)
MCHC: 32.1 g/dL (ref 30.0–36.0)
MCV: 110.2 fL — ABNORMAL HIGH (ref 80.0–100.0)
Platelets: 145 10*3/uL — ABNORMAL LOW (ref 150–400)
RBC: 2.94 MIL/uL — ABNORMAL LOW (ref 4.22–5.81)
RDW: 16 % — ABNORMAL HIGH (ref 11.5–15.5)
WBC: 3.7 10*3/uL — ABNORMAL LOW (ref 4.0–10.5)
nRBC: 0 % (ref 0.0–0.2)

## 2021-11-01 LAB — HEPATIC FUNCTION PANEL
ALT: 33 U/L (ref 0–44)
AST: 27 U/L (ref 15–41)
Albumin: 3.7 g/dL (ref 3.5–5.0)
Alkaline Phosphatase: 53 U/L (ref 38–126)
Bilirubin, Direct: <0.1 mg/dL (ref 0.0–0.2)
Total Bilirubin: 0.5 mg/dL (ref 0.3–1.2)
Total Protein: 6.9 g/dL (ref 6.5–8.1)

## 2021-11-01 LAB — CBG MONITORING, ED: Glucose-Capillary: 312 mg/dL — ABNORMAL HIGH (ref 70–99)

## 2021-11-01 LAB — RESP PANEL BY RT-PCR (FLU A&B, COVID) ARPGX2
Influenza A by PCR: NEGATIVE
Influenza B by PCR: NEGATIVE
SARS Coronavirus 2 by RT PCR: NEGATIVE

## 2021-11-01 MED ORDER — OMEPRAZOLE 20 MG PO CPDR: 40.0000 mg | DELAYED_RELEASE_CAPSULE | Freq: Every day | ORAL | 3 refills | Status: DC

## 2021-11-01 MED ORDER — SODIUM CHLORIDE 0.9 % IV BOLUS
500.0000 mL | Freq: Once | INTRAVENOUS | Status: AC
  Administered 2021-11-01: 500 mL via INTRAVENOUS

## 2021-11-01 MED ORDER — ISOSORBIDE MONONITRATE ER 30 MG PO TB24: 15.0000 mg | ORAL_TABLET | Freq: Every day | ORAL | 3 refills | Status: DC

## 2021-11-01 MED ORDER — ATORVASTATIN CALCIUM 80 MG PO TABS: 80.0000 mg | ORAL_TABLET | Freq: Every evening | ORAL | 3 refills | Status: DC

## 2021-11-01 MED ORDER — METOPROLOL SUCCINATE ER 25 MG PO TB24: 25.0000 mg | ORAL_TABLET | ORAL | 3 refills | Status: DC

## 2021-11-01 MED ORDER — MAGNESIUM OXIDE 400 MG PO CAPS: 400.0000 mg | ORAL_CAPSULE | Freq: Every day | ORAL | 3 refills | Status: DC

## 2021-11-01 NOTE — ED Notes (Signed)
Discharge instructions provided to patient with follow-up. Patient verbalized understanding. Patient taken by wheelchair to vehicle. ?

## 2021-11-01 NOTE — ED Provider Notes (Signed)
? ?[facility] ?Provider Note ? ? ? Event Date/Time  ? First MD Initiated Contact with Patient 11/01/21 1759   ?  (approximate) ? ? ?History  ? ?Dizziness (Sent by [facility]) ? ? ?HPI ? ?Jonathan Cross. is a 76 y.o. male with a history of hypertension diabetes aortic valve replacement CAD status post CABG, paroxysmal atrial fibrillation, chronic dizziness and chronic chest pain that of been extensively worked up by cardiology who was sent to the ED from cardiology clinic due to dizziness.  He reports that his vertigo has been worse than usual for the past 2 weeks but is consistent with his chronic symptoms.  He has not had any syncope or head trauma.  No falls.  He walks around steady himself with furniture, which is his usual practice over the past several years.  He also has some intermittent chest discomfort which she also reports is chronic for many years.  Has been compliant with his medications.  From his perspective he has no acute symptoms. ? ?Cardiology did start him on Lasix a few days ago, but he has not noticed any significant increase in urine output or weight change recently.  Denies shortness of breath or cough. ?  ? ? ?Physical Exam  ? ?Triage Vital Signs: ?ED Triage Vitals  ?Enc Vitals Group  ?   BP 11/01/21 1606 (!) 127/55  ?   Pulse Rate 11/01/21 1606 74  ?   Resp 11/01/21 1606 18  ?   Temp 11/01/21 1606 98.3 ?F (36.8 ?C)  ?   Temp Source 11/01/21 1606 Oral  ?   SpO2 11/01/21 1606 95 %  ?   Weight 11/01/21 1607 271 lb 2.7 oz (123 kg)  ?   Height 11/01/21 1607 '5\' 9"'$  (1.753 m)  ?   Head Circumference --   ?   Peak Flow --   ?   Pain Score 11/01/21 1607 0  ?   Pain Loc --   ?   Pain Edu? --   ?   Excl. in [place]? --   ? ? ?Most recent vital signs: ?Vitals:  ? 11/01/21 2045 11/01/21 2046  ?BP: (!) 170/82 (!) 164/76  ?Pulse: 75 86  ?Resp: 17 20  ?Temp:    ?SpO2: 93% 96%  ? ? ? ?General: Awake, no distress.  ?CV:  Good peripheral perfusion.  Regular rate and rhythm ?Resp:  Normal  effort.  Clear to auscultation bilaterally ?Abd:  No distention.  Soft and nontender ?Other:  1+ pitting edema bilateral lower extremities.  Symmetric.  No calf tenderness or inflammatory changes. ? ? ?ED Results / Procedures / Treatments  ? ?Labs ?(all labs ordered are listed, but only abnormal results are displayed) ?Labs Reviewed  ?BASIC METABOLIC PANEL - Abnormal; Notable for the following components:  ?    Result Value  ? Glucose, Bld 307 (*)   ? All other components within normal limits  ?CBC - Abnormal; Notable for the following components:  ? WBC 3.7 (*)   ? RBC 2.94 (*)   ? Hemoglobin 10.4 (*)   ? HCT 32.4 (*)   ? MCV 110.2 (*)   ? MCH 35.4 (*)   ? RDW 16.0 (*)   ? Platelets 145 (*)   ? All other components within normal limits  ?URINALYSIS, ROUTINE W REFLEX MICROSCOPIC - Abnormal; Notable for the following components:  ? Color, Urine YELLOW (*)   ? APPearance HAZY (*)   ? Glucose, UA >=  500 (*)   ? Bacteria, UA RARE (*)   ? All other components within normal limits  ?CBG MONITORING, ED - Abnormal; Notable for the following components:  ? Glucose-Capillary 312 (*)   ? All other components within normal limits  ?RESP PANEL BY RT-PCR (FLU A&B, COVID) [id number]  ?HEPATIC FUNCTION PANEL  ?LIPASE, BLOOD  ?CBG MONITORING, ED  ? ? ? ?EKG ? ?Interpreted by me ?Normal sinus Rhythm rate of 70.  Left axis, normal intervals.  Poor R wave progression.  Normal ST segments and T waves.  No ischemic changes. ? ? ?RADIOLOGY ?Chest x-ray viewed and interpreted by me, unremarkable.  No pulmonary edema.  Radiology report reviewed ? ?PROCEDURES: ? ?Critical Care performed: No ? ?Procedures ? ? ?MEDICATIONS ORDERED IN ED: ?Medications  ?sodium chloride 0.9 % bolus 500 mL (0 mLs Intravenous Stopped 11/01/21 2045)  ? ? ? ?IMPRESSION / MDM / ASSESSMENT AND PLAN / ED COURSE  ?I reviewed the triage vital signs and the nursing notes. ?             ?               ? ?Differential diagnosis includes, but is not limited to, dehydration,  electrolyte abnormality, pulmonary edema, pleural effusion, pneumonia, intracranial mass, subacute stroke ? ?Patient sent to the ED for evaluation of dizziness.  Per the patient, his dizziness and chest pressure are recurrent, chronic issues spanning many years.  He does not feel that he is having any new or acutely worsening symptoms.  His vital signs are unremarkable.  He does appear to be somewhat dehydrated from me after the recent initiation of Lasix.  I gave him 500 mL of IV fluids and he states that he feels perfectly fine.  He is ambulatory in the room.  Orthostatic vital signs are unremarkable.  He does not require admission due to his reassuring work-up and can be discharged home. ?  ? ? ?FINAL CLINICAL IMPRESSION(S) / ED DIAGNOSES  ? ?Final diagnoses:  ?Dizziness  ? ? ? ?Rx / DC Orders  ? ?ED Discharge Orders   ? ? None  ? ?  ? ? ? ?Note:  This document was prepared using Dragon voice recognition software and may include unintentional dictation errors. ?  ?Carrie Mew, MD ?11/01/21 2110 ? ?

## 2021-11-01 NOTE — Progress Notes (Signed)
? ? ?Office Visit  ?  ?Patient Name: Miachel Nardelli [facility]. ?Date of Encounter: 11/01/2021 ? ?Primary Care Provider:  Georges Mouse, MD ?Primary Cardiologist:  Nelva Bush, MD ? ?Chief Complaint  ?  ?76 year old male with a history of CAD status post CABG x2 (LIMA to LAD, vein graft to the obtuse marginal), severe aortic stenosis status post bioprosthetic aortic valve replacement complicated by endocarditis, paroxysmal atrial fibrillation, hypertension, hyperlipidemia, HFpEF, type 2 diabetes mellitus, sleep apnea, and BPH, who presents for follow-up related dyspnea with new complaints of profound fatigue and malaise. ? ?Past Medical History  ?  ?Past Medical History:  ?Diagnosis Date  ? 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 Coronary artery disease   ? a. 01/2016 CABG x 2: LIMA->LAD, VG->OM; b. 05/2021 MV: small, mild, rev apical lateral and apical inf defects ->subtle ischemia vs artifact, EF 55-65%-->low risk.  ? Diabetes ([place])   ? H/O aortic valve replacement   ? a. 01/2016 s/p bioprosthetic AVR @ [facility] for Severe AS; b. 05/2021 Echo: EF 55-60%, no rwma, mild LVH, nl RV fxn, nl fxn of bioprosthetic AVR.  ? History of kidney stones   ? Hx of CABG   ? Hyperlipidemia   ? Hypertension   ? PAF (paroxysmal atrial fibrillation) ([place])   ? a. CHA2DS2VASc = 5-->eliquis/amio.  ? Sleep apnea   ? ?Past Surgical History:  ?Procedure Laterality Date  ? AORTIC VALVE REPLACEMENT  2017  ? [facility], bioprosthetic  ? APPENDECTOMY    ? CARDIAC VALVE REPLACEMENT    ? CORONARY ARTERY BYPASS GRAFT  2017  ? [facility] - LIMA-LAD and SVG-OM  ? CYSTOSCOPY W/ RETROGRADES Right 05/27/2019  ? Procedure: CYSTOSCOPY WITH RETROGRADE PYELOGRAM;  Surgeon: Billey Co, MD;  Location: [facility];  Service: Urology;  Laterality: Right;  ? CYSTOSCOPY W/ RETROGRADES Right 07/11/2019  ? Procedure: CYSTOSCOPY WITH RETROGRADE PYELOGRAM;  Surgeon: Billey Co, MD;  Location: [facility];  Service: Urology;  Laterality: Right;  ? CYSTOSCOPY/URETEROSCOPY/HOLMIUM  LASER/STENT PLACEMENT Right 05/27/2019  ? Procedure: CYSTOSCOPY/URETEROSCOPY/HOLMIUM LASER/STENT PLACEMENT;  Surgeon: Billey Co, MD;  Location: [facility];  Service: Urology;  Laterality: Right;  ? CYSTOSCOPY/URETEROSCOPY/HOLMIUM LASER/STENT PLACEMENT Right 06/17/2019  ? Procedure: CYSTOSCOPY/URETEROSCOPY/HOLMIUM LASER/STENT Exchange;  Surgeon: Billey Co, MD;  Location: [facility];  Service: Urology;  Laterality: Right;  ? CYSTOSCOPY/URETEROSCOPY/HOLMIUM LASER/STENT PLACEMENT Right 07/11/2019  ? Procedure: CYSTOSCOPY/URETEROSCOPY/HOLMIUM LASER/STENT Exchange;  Surgeon: Billey Co, MD;  Location: [facility];  Service: Urology;  Laterality: Right;  ? STONE EXTRACTION WITH BASKET Right 07/11/2019  ? Procedure: STONE EXTRACTION WITH BASKET;  Surgeon: Billey Co, MD;  Location: [facility];  Service: Urology;  Laterality: Right;  ? TEE WITHOUT CARDIOVERSION N/A 04/21/2019  ? Procedure: TRANSESOPHAGEAL ECHOCARDIOGRAM (TEE);  Surgeon: Minna Merritts, MD;  Location: [facility];  Service: Cardiovascular;  Laterality: N/A;  ? TEE WITHOUT CARDIOVERSION N/A 07/04/2019  ? Procedure: TRANSESOPHAGEAL ECHOCARDIOGRAM (TEE);  Surgeon: Wellington Hampshire, MD;  Location: [facility];  Service: Cardiovascular;  Laterality: N/A;  ? TEE WITHOUT CARDIOVERSION N/A 08/17/2019  ? Procedure: TRANSESOPHAGEAL ECHOCARDIOGRAM (TEE);  Surgeon: Kate Sable, MD;  Location: [facility];  Service: Cardiovascular;  Laterality: N/A;  ? TEE WITHOUT CARDIOVERSION N/A 11/15/2019  ? Procedure: TRANSESOPHAGEAL ECHOCARDIOGRAM (TEE);  Surgeon: Nelva Bush, MD;  Location: [facility];  Service: Cardiovascular;  Laterality: N/A;  ? TONSILLECTOMY    ? ? ?Allergies ? ?Allergies  ?Allergen Reactions  ? Metformin Diarrhea  ?  Loose stools even with XR  ? ? ?  History of Present Illness  ?  ?76 year old male with the above past medical history including CAD status post CABG x2, severe aortic stenosis status post bioprosthetic aortic valve replacement,  bacterial endocarditis, paroxysmal atrial fibrillation, hypertension, hyperlipidemia, type 2 diabetes mellitus, sleep apnea, and BPH.  He was previously followed at [facility] in the setting of severe aortic stenosis, underwent bioprosthetic aortic valve replacement and two-vessel coronary bypass grafting in July 2017.  Between October 2020 and May 2021, he had multiple admissions related to fever, malaise, and Enterococcus faecalis bacteremia.  Transesophageal echocardiograms in October 2020 and December 2020, were negative for vegetation.  In February 2021, he was found to have a mobile mass on his prosthetic aortic valve, and was seen by infectious disease and treated with appropriate antibiotics for 6 weeks in the outpatient setting.  Prior to PICC line removal, repeat TEE in May 2021 continued to show an echodensity that appeared less mobile but larger, measuring 1.1 x 0.6 cm.  Perivalvular abscess could not be excluded.  He was transferred to [facility] where cardiac CT showed evidence of a small mass in the leaflet of the noncoronary cusp, likely representing a healed vegetation.  Leaflets were freely movable without evidence of obstruction.  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  The LIMA to the LAD and vein graft to the obtuse marginal were patent.  He was seen by CT surgery who recommended continued observation and antibiotic therapy.  In November 2022, he was admitted with dyspnea and chest fullness and found to be in rapid atrial fibrillation, which converted to sinus rhythm on intravenous amiodarone.  He underwent stress testing in the setting of chest pain, which showed a small, mild, reversible apical lateral and apical inferior defect with question of subtle ischemia versus infarct.  Ultimately, it was felt to be a low risk study and he was medically managed. ? ?At recent follow-up on April 14, he noted progressive  exertional dyspnea since being seen in November 2022.  He also had a 30 pound weight gain in the setting of recent prednisone therapy.  He noted chronic, mild 2/10 left-sided chest discomfort dating back to his surgery in 2017.  He was felt to volume overloaded and I added Lasix 40 mg daily with plan for follow-up today and consideration for right and left heart cardiac catheterization.  Today, Mr. Febles complains of significant malaise, fatigue, tiredness, and lightheadedness that started earlier this morning.  He said he had trouble staying focused while driving here and then was very unsteady while walking into the office, he has only lost about 2 pounds since being placed on Lasix and has not noticed any significant change in his degree of dyspnea on exertion.  He denies fevers, chills, cough, or recent sick contacts. ? ?Home Medications  ?  ?Current Outpatient Medications  ?Medication Sig Dispense Refill  ? amiodarone (PACERONE) 200 MG tablet Take 1 tablet (200 mg total) by mouth daily. 90 tablet 1  ? aspirin EC 81 MG tablet Take 81 mg by mouth daily.    ? ELIQUIS 5 MG TABS tablet Take 1 tablet (5 mg total) by mouth 2 (two) times daily. 180 tablet 3  ? furosemide (LASIX) 40 MG tablet Take 1 tablet (40 mg total) by mouth daily. 90 tablet 3  ? insulin NPH-regular Human (70-30) 100 UNIT/ML injection Inject 120 Units into the skin 2 (two) times daily.    ?  loratadine (CLARITIN) 10 MG tablet Take 10 mg by mouth daily.    ? meclizine (ANTIVERT) 25 MG tablet Take 1 tablet (25 mg total) by mouth 3 (three) times daily as needed for dizziness. 20 tablet 0  ? nitroGLYCERIN (NITROSTAT) 0.4 MG SL tablet Place 0.4 mg under the tongue every 5 (five) minutes x 3 doses as needed for chest pain.     ? Potassium Citrate 15 MEQ (1620 MG) TBCR Take 2 tablets by mouth 2 (two) times daily. 120 tablet 6  ? tamsulosin (FLOMAX) 0.4 MG CAPS capsule Take 0.4 mg by mouth daily.    ? atorvastatin (LIPITOR) 80 MG tablet Take 1 tablet (80  mg total) by mouth every evening. 90 tablet 3  ? isosorbide mononitrate (IMDUR) 30 MG 24 hr tablet Take 0.5 tablets (15 mg total) by mouth daily. 45 tablet 3  ? Magnesium Oxide 400 MG CAPS Take 1 capsule (400 mg to

## 2021-11-01 NOTE — ED Triage Notes (Addendum)
Pt to ED sent by [facility], states has been having vertigo for 2 weeks, worse today. States when stands up feels dizzy. [address] one time this week right after standing up. States has to hold onto furniture when walks, "since years ago". Has walker at home but doesn't use. Recommended to use walker over furniture. ? ?Pt also stating that has mid to left chest pressure that is always present since 5-6 years ago. Had mitral valve replacement 6 years ago. ? ?CBG is 312. ?

## 2021-11-01 NOTE — ED Notes (Signed)
Orthostatic vital signs completed and reported to provider. Patient did well and had no complaints except that he was tired. When standing, patient had to hold onto this nurse and prop himself up against the stretcher. However, he states this is normal beause he uses furniture to get around at home versus a walker or cane. ?

## 2021-11-01 NOTE — ED Notes (Signed)
RN to bedside to introduce self to pt. Pt advised he had a routine apt at [facility] and they made him come to the ER.  ?

## 2021-11-01 NOTE — Patient Instructions (Signed)
Medication Instructions:  ?- Your physician recommends that you continue on your current medications as directed. Please refer to the Current Medication list given to you today. ? ?*If you need a refill on your cardiac medications before your next appointment, please call your pharmacy* ? ? ?Lab Work: ?- none ordered ? ?If you have labs (blood work) drawn today and your tests are completely normal, you will receive your results only by: ?MyChart Message (if you have MyChart) OR ?A paper copy in the mail ?If you have any lab test that is abnormal or we need to change your treatment, we will call you to review the results. ? ? ?Testing/Procedures: ?- none ordered ? ? ?Follow-Up: ?At [facility], you and your health needs are our priority.  As part of our continuing mission to provide you with exceptional heart care, we have created designated Provider Care Teams.  These Care Teams include your primary Cardiologist (physician) and Advanced Practice Providers (APPs -  Physician Assistants and Nurse Practitioners) who all work together to provide you with the care you need, when you need it. ? ?We recommend signing up for the patient portal called "MyChart".  Sign up information is provided on this After Visit Summary.  MyChart is used to connect with patients for Virtual Visits (Telemedicine).  Patients are able to view lab/test results, encounter notes, upcoming appointments, etc.  Non-urgent messages can be sent to your provider as well.   ?To learn more about what you can do with MyChart, go to [email or web address].   ? ?Your next appointment:   ?2 week(s) ? ?The format for your next appointment:   ?In Person ? ?Provider:   ?You may see Nelva Bush, MD or one of the following Advanced Practice Providers on your designated Care Team:   ?Murray Hodgkins, NP ? ? ? ?Other Instructions ?N/a ? ?Important Information About Sugar ? ? ? ? ? ? ?

## 2021-11-01 NOTE — ED Notes (Signed)
Patient resting comfortably on stretcher. RR even and unlabored. Patient verbalizes no needs or complaints at this time. Patient to have INT started by this nurse as well as fluids. Patient states that he thinks his problem was because he didn't drink any water today. ?

## 2021-11-01 NOTE — Discharge Instructions (Signed)
Your ED evaluation today was okay.  Please continue taking all of your medications as usual and follow-up with your primary care doctor in 3 days. ?

## 2021-11-01 NOTE — ED Notes (Signed)
Blue top sent with labs. ?

## 2021-11-05 NOTE — Telephone Encounter (Signed)
No answer/Voicemail box not set up.  

## 2021-11-06 ENCOUNTER — Telehealth: Payer: Self-pay | Admitting: *Deleted

## 2021-11-06 NOTE — Telephone Encounter (Signed)
I called and spoke with the patient to see how he is feeling today. ?Per the patient, he is feeling much better and was advised by the ER on Friday that he may have been dehydrated. ? ?I advised him that Dr. Saunders Revel would like to see him in the office next week. ?Per the patient, he had already received a call about being seen on 5/3 at 8:40 am. ? ?The patient was agreeable to the appointment date/ time. ? ?I have advised him to call us back in the interim if symptoms worsen. ?Again, he is agreeable. ? ?

## 2021-11-06 NOTE — Telephone Encounter (Signed)
-----   Message from Nelva Bush, MD sent at 11/05/2021  5:34 PM EDT ----- ?I can see him next Wednesday (5/3) at 8:40. ? ?Chris ?----- Message ----- ?From: Emily Filbert, RN ?Sent: 11/05/2021   4:57 PM EDT ?To: Nelva Bush, MD, Valora Corporal, RN, # ? ?Hey Dr. Saunders Revel, Gerald Stabs saw this patient last week and he sent in to the ED. Gerald Stabs thinks he may need a right & left heart cath but advised this needed to be deferred to you. ?  ?The patient refused to come in in 2 weeks as originally recommended by Gerald Stabs the day of his appointment.  ? ?Is there a place on your schedule you would be ok with [place] offering the patient in the next 1-2 months?  ? ?All available spots are designated as DOD/ 7 day hold, etc. ? ? ?Thanks! ?----- Message ----- ?From: Theora Gianotti, NP ?Sent: 11/05/2021   3:41 PM EDT ?To: Valora Corporal, RN, Emily Filbert, RN ? ?I'm confused by his presentation to [place] and his subsequent report related to symptoms in the ED last week.  I think it's best that he f/u with Dr. Saunders Revel in 1-2 months. ?----- Message ----- ?From: Emily Filbert, RN ?Sent: 11/05/2021   2:24 PM EDT ?To: Valora Corporal, RN, # ? ?Gerald Stabs,  ? ?See below- when should the patient follow up? ? ?Thanks! ?----- Message ----- ?From: Johnny Bridge A ?Sent: 11/05/2021   2:01 PM EDT ?To: Emily Filbert, RN ? ?Attempted to schedule patient refused states not needed feeling ok. ?----- Message ----- ?From: Emily Filbert, RN ?Sent: 11/05/2021  11:52 AM EDT ?To: Clarisse Gouge, Pilar A Ham ? ?Patient seen on Friday 4/21 by Ignacia Bayley- sent to the ED for dizziness, but needed a 2 week follow up with Gerald Stabs or Dr. Saunders Revel. ? ?If nothing available I'm sure Gerald Stabs would be ok with using one of his 7 day hold slots ? ?Thanks! ? ? ? ? ? ? ?

## 2021-11-07 NOTE — Telephone Encounter (Signed)
Reviewed results, recommendations, and confirmed upcoming appointment. He verbalized understanding with no further questions at this time.  

## 2021-11-13 ENCOUNTER — Ambulatory Visit (INDEPENDENT_AMBULATORY_CARE_PROVIDER_SITE_OTHER): Payer: Medicare HMO | Admitting: Internal Medicine

## 2021-11-13 ENCOUNTER — Encounter: Payer: Self-pay | Admitting: Internal Medicine

## 2021-11-13 ENCOUNTER — Other Ambulatory Visit
Admission: RE | Admit: 2021-11-13 | Discharge: 2021-11-13 | Disposition: A | Discharge status: Home / Self Care | Payer: Medicare HMO | Attending: Internal Medicine | Admitting: Internal Medicine

## 2021-11-13 VITALS — BP 152/71 | HR 70 | Ht 69.0 in | Wt 271.0 lb

## 2021-11-13 DIAGNOSIS — I480 Paroxysmal atrial fibrillation: Secondary | ICD-10-CM | POA: Diagnosis not present

## 2021-11-13 DIAGNOSIS — Z8679 Personal history of other diseases of the circulatory system: Secondary | ICD-10-CM | POA: Insufficient documentation

## 2021-11-13 DIAGNOSIS — D649 Anemia, unspecified: Secondary | ICD-10-CM

## 2021-11-13 DIAGNOSIS — I25118 Atherosclerotic heart disease of native coronary artery with other forms of angina pectoris: Secondary | ICD-10-CM | POA: Insufficient documentation

## 2021-11-13 DIAGNOSIS — I5023 Acute on chronic systolic (congestive) heart failure: Secondary | ICD-10-CM | POA: Diagnosis not present

## 2021-11-13 LAB — CBC
HCT: 36 % — ABNORMAL LOW (ref 39.0–52.0)
Hemoglobin: 11.6 g/dL — ABNORMAL LOW (ref 13.0–17.0)
MCH: 35.4 pg — ABNORMAL HIGH (ref 26.0–34.0)
MCHC: 32.2 g/dL (ref 30.0–36.0)
MCV: 109.8 fL — ABNORMAL HIGH (ref 80.0–100.0)
Platelets: 129 10*3/uL — ABNORMAL LOW (ref 150–400)
RBC: 3.28 MIL/uL — ABNORMAL LOW (ref 4.22–5.81)
RDW: 16.1 % — ABNORMAL HIGH (ref 11.5–15.5)
WBC: 3.3 10*3/uL — ABNORMAL LOW (ref 4.0–10.5)
nRBC: 0 % (ref 0.0–0.2)

## 2021-11-13 NOTE — Progress Notes (Signed)
? ?Follow-up Outpatient Visit ?Date: 11/13/2021 ? ?Primary Care Provider: ?System, Provider Not In ?No address on file ? ?Chief Complaint: Dyspnea on exertion ? ?HPI:  Jonathan Cross is a 76 y.o. male with history of  CAD s/p 2-V CABG (LIMA-LAD, SVG-OM) and severe AS s/p bioprosthetic AVR (01/2016) c/b prior endocarditis, HFpEF, AF (on Eliquis) HTN, HLD, DM2, OSA, and BPH, who presents for follow-up of atrial fibrillation, CAD, and valvular heart disease, who presents for follow-up of dizziness and heart failure.  He was last seen in our office on 11/01/2021 by Ignacia Bayley, NP, for evaluation of progressive exertional dyspnea and 30 pound weight gain in the setting of recent prednisone therapy.  He had been placed on furosemide due to concern for acute on chronic HFpEF.  At his follow-up visit, he complained of significant malaise, fatigue, and lightheadedness.  He had difficulty remaining focused while driving to the office and was very unstable walking from his vehicle.  He was hypotensive with a blood pressure of 110/44.  He was referred to the ED, where he was actually quite hypertensive.  There was concern that he may actually be dehydrated despite having not lost much weight with furosemide.  He was given 500 mL normal saline bolus and discharged home.  Labs were notable for moderate anemia with a 3 gm drop in hemoglobin over the preceding 6 weeks. ? ?Today, Jonathan Cross reports that he feels back to his baseline with resolution of dizziness.  He still gets out of breath easily, usually only walking 15-20 feet before needing to rest.  He notes that it has been that way for years.  He denies chest pain, palpitations, and lightheadedness.  He denies significant edema and remains on furosemide 40 mg daily.  He has occasional scant rectal bleeding with loose stools but otherwise has not noticed any obvious bleeding.  He remains on aspirin and  apixaban. ? ?-------------------------------------------------------------------------------------------------- ? ?Past Medical History:  ?Diagnosis Date  ? 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 Coronary artery disease   ? a. 01/2016 CABG x 2: LIMA->LAD, VG->OM; b. 05/2021 MV: small, mild, rev apical lateral and apical inf defects ->subtle ischemia vs artifact, EF 55-65%-->low risk.  ? Diabetes ([place])   ? H/O aortic valve replacement   ? a. 01/2016 s/p bioprosthetic AVR @ [facility] for Severe AS; b. 05/2021 Echo: EF 55-60%, no rwma, mild LVH, nl RV fxn, nl fxn of bioprosthetic AVR.  ? History of kidney stones   ? Hx of CABG   ? Hyperlipidemia   ? Hypertension   ? PAF (paroxysmal atrial fibrillation) ([place])   ? a. CHA2DS2VASc = 5-->eliquis/amio.  ? Sleep apnea   ? ?Past Surgical History:  ?Procedure Laterality Date  ? AORTIC VALVE REPLACEMENT  2017  ? [facility], bioprosthetic  ? APPENDECTOMY    ? CARDIAC VALVE REPLACEMENT    ? CORONARY ARTERY BYPASS GRAFT  2017  ? [facility] - LIMA-LAD and SVG-OM  ? CYSTOSCOPY W/ RETROGRADES Right 05/27/2019  ? Procedure: CYSTOSCOPY WITH RETROGRADE PYELOGRAM;  Surgeon: Billey Co, MD;  Location: [facility];  Service: Urology;  Laterality: Right;  ? CYSTOSCOPY W/ RETROGRADES Right 07/11/2019  ? Procedure: CYSTOSCOPY WITH RETROGRADE PYELOGRAM;  Surgeon: Billey Co, MD;  Location: [facility];  Service: Urology;  Laterality: Right;  ? CYSTOSCOPY/URETEROSCOPY/HOLMIUM LASER/STENT PLACEMENT Right 05/27/2019  ? Procedure: CYSTOSCOPY/URETEROSCOPY/HOLMIUM LASER/STENT PLACEMENT;  Surgeon: Billey Co, MD;  Location: [facility];  Service: Urology;  Laterality: Right;  ? CYSTOSCOPY/URETEROSCOPY/HOLMIUM LASER/STENT PLACEMENT Right 06/17/2019  ?  Procedure: CYSTOSCOPY/URETEROSCOPY/HOLMIUM LASER/STENT Exchange;  Surgeon: Billey Co, MD;  Location: [facility];  Service: Urology;  Laterality: Right;  ? CYSTOSCOPY/URETEROSCOPY/HOLMIUM LASER/STENT PLACEMENT Right 07/11/2019  ? Procedure:  CYSTOSCOPY/URETEROSCOPY/HOLMIUM LASER/STENT Exchange;  Surgeon: Billey Co, MD;  Location: [facility];  Service: Urology;  Laterality: Right;  ? STONE EXTRACTION WITH BASKET Right 07/11/2019  ? Procedure: STONE EXTRACTION WITH BASKET;  Surgeon: Billey Co, MD;  Location: [facility];  Service: Urology;  Laterality: Right;  ? TEE WITHOUT CARDIOVERSION N/A 04/21/2019  ? Procedure: TRANSESOPHAGEAL ECHOCARDIOGRAM (TEE);  Surgeon: Minna Merritts, MD;  Location: [facility];  Service: Cardiovascular;  Laterality: N/A;  ? TEE WITHOUT CARDIOVERSION N/A 07/04/2019  ? Procedure: TRANSESOPHAGEAL ECHOCARDIOGRAM (TEE);  Surgeon: Wellington Hampshire, MD;  Location: [facility];  Service: Cardiovascular;  Laterality: N/A;  ? TEE WITHOUT CARDIOVERSION N/A 08/17/2019  ? Procedure: TRANSESOPHAGEAL ECHOCARDIOGRAM (TEE);  Surgeon: Kate Sable, MD;  Location: [facility];  Service: Cardiovascular;  Laterality: N/A;  ? TEE WITHOUT CARDIOVERSION N/A 11/15/2019  ? Procedure: TRANSESOPHAGEAL ECHOCARDIOGRAM (TEE);  Surgeon: Nelva Bush, MD;  Location: [facility];  Service: Cardiovascular;  Laterality: N/A;  ? TONSILLECTOMY    ? ? ? ?Current Meds  ?Medication Sig  ? amiodarone (PACERONE) 200 MG tablet Take 1 tablet (200 mg total) by mouth daily.  ? aspirin EC 81 MG tablet Take 81 mg by mouth daily.  ? atorvastatin (LIPITOR) 80 MG tablet Take 1 tablet (80 mg total) by mouth every evening.  ? ELIQUIS 5 MG TABS tablet Take 1 tablet (5 mg total) by mouth 2 (two) times daily.  ? furosemide (LASIX) 40 MG tablet Take 1 tablet (40 mg total) by mouth daily.  ? insulin NPH-regular Human (70-30) 100 UNIT/ML injection Inject 120 Units into the skin 2 (two) times daily.  ? isosorbide mononitrate (IMDUR) 30 MG 24 hr tablet Take 0.5 tablets (15 mg total) by mouth daily.  ? loratadine (CLARITIN) 10 MG tablet Take 10 mg by mouth daily.  ? Magnesium Oxide 400 MG CAPS Take 1 capsule (400 mg total) by mouth daily.  ? meclizine (ANTIVERT) 25 MG tablet Take 1  tablet (25 mg total) by mouth 3 (three) times daily as needed for dizziness.  ? metoprolol succinate (TOPROL-XL) 25 MG 24 hr tablet Take 1-2 tablets (25-50 mg total) by mouth See admin instructions. Take 1 tablet ('25mg'$ ) by mouth every morning and take 2 tablets ('50mg'$ ) by mouth every evening  ? nitroGLYCERIN (NITROSTAT) 0.4 MG SL tablet Place 0.4 mg under the tongue every 5 (five) minutes x 3 doses as needed for chest pain.   ? omeprazole (PRILOSEC) 20 MG capsule Take 2 capsules (40 mg total) by mouth daily.  ? Potassium Citrate 15 MEQ (1620 MG) TBCR Take 2 tablets by mouth 2 (two) times daily.  ? tamsulosin (FLOMAX) 0.4 MG CAPS capsule Take 0.4 mg by mouth daily.  ? ? ?Allergies: Metformin ? ?Social History  ? ?Tobacco Use  ? Smoking status: Former  ? Smokeless tobacco: Never  ?Vaping Use  ? Vaping Use: Never used  ?Substance Use Topics  ? Alcohol use: Never  ? Drug use: Never  ? ? ?Family History  ?Problem Relation Age of Onset  ? Heart attack Father 46  ? Heart attack Sister   ? ? ?Review of Systems: ?A 12-system review of systems was performed and was negative except as noted in the HPI. ? ?-------------------------------------------------------------------------------------------------- ? ?Physical Exam: ?BP (!) 152/71 (BP Location: Left Arm, Patient Position: Sitting, Cuff  Size: Large)   Pulse 70   Ht '5\' 9"'$  (1.753 m)   Wt 271 lb (122.9 kg)   SpO2 97%   BMI 40.02 kg/m?  ? ?General:  NAD. ?Neck: No JVD or HJR. ?Lungs: Clear to auscultation bilaterally without wheezes or crackles. ?Heart: Regular rate and rhythm with 2/6 systolic murmur.  No rubs or gallops. ?Abdomen: Obese and firm.  Nontender. ?Extremities: 1+ pitting edema bilaterally. ? ?EKG: Normal sinus rhythm with left atrial enlargement, left ventricular hypertrophy, septal infarct, and lateral T wave inversions.  No significant change from prior tracing on 11/01/2021. ? ?Lab Results  ?Component Value Date  ? WBC 3.7 (L) 11/01/2021  ? HGB 10.4 (L)  11/01/2021  ? HCT 32.4 (L) 11/01/2021  ? MCV 110.2 (H) 11/01/2021  ? PLT 145 (L) 11/01/2021  ? ? ?Lab Results  ?Component Value Date  ? NA 135 11/01/2021  ? K 3.7 11/01/2021  ? CL 102 11/01/2021  ? CO2 23 11/01/2021  ? BUN 17 11/01/2021  ? CREATININE 1.06 11/01/2021  ? GLUCOSE 307 (H) 11/01/2021  ? ALT 33 11/01/2021

## 2021-11-13 NOTE — Patient Instructions (Signed)
Medication Instructions:  ? ?Your physician has recommended you make the following change in your medication:  ? ?INCREASE Lasix (furosemide) 40 mg TWICE daily for ONE WEEK,  ? ?THEN, resume normal dose of Lasix (furosemide) 40 mg daily  ? ?*If you need a refill on your cardiac medications before your next appointment, please call your pharmacy* ? ? ?Lab Work: ? ?Today at the medical mall: CBC, Blood cultures x 2 ? ?-  Please go to the [facility].  ?-  You will check in at the front desk to the right as you walk into the atrium.  ? ?If you have labs (blood work) drawn today and your tests are completely normal, you will receive your results only by: ?MyChart Message (if you have MyChart) OR ?A paper copy in the mail ?If you have any lab test that is abnormal or we need to change your treatment, we will call you to review the results. ? ? ?Testing/Procedures: ? ?None ordered ? ? ?Follow-Up: ?At [facility], you and your health needs are our priority.  As part of our continuing mission to provide you with exceptional heart care, we have created designated Provider Care Teams.  These Care Teams include your primary Cardiologist (physician) and Advanced Practice Providers (APPs -  Physician Assistants and Nurse Practitioners) who all work together to provide you with the care you need, when you need it. ? ?We recommend signing up for the patient portal called "MyChart".  Sign up information is provided on this After Visit Summary.  MyChart is used to connect with patients for Virtual Visits (Telemedicine).  Patients are able to view lab/test results, encounter notes, upcoming appointments, etc.  Non-urgent messages can be sent to your provider as well.   ?To learn more about what you can do with MyChart, go to [email or web address].   ? ?Your next appointment:  ?  ?2  - 3 week(s) w/ APP ? ?The format for your next appointment:   ?In Person ? ?Provider:   ?You will see one of the following Advanced Practice  Providers on your designated Care Team:   ?Murray Hodgkins, NP ?Christell Faith, PA-C ?Cadence Kathlen Mody, PA-C ? ? ?Important Information About Sugar ? ? ? ? ? ? ?

## 2021-11-18 LAB — CULTURE, BLOOD (SINGLE)
Culture: NO GROWTH
Culture: NO GROWTH
Special Requests: ADEQUATE
Special Requests: ADEQUATE

## 2021-11-27 ENCOUNTER — Ambulatory Visit (INDEPENDENT_AMBULATORY_CARE_PROVIDER_SITE_OTHER): Payer: Medicare HMO | Admitting: Nurse Practitioner

## 2021-11-27 ENCOUNTER — Other Ambulatory Visit
Admission: RE | Admit: 2021-11-27 | Discharge: 2021-11-27 | Disposition: A | Discharge status: Home / Self Care | Payer: Medicare HMO | Attending: Nurse Practitioner | Admitting: Nurse Practitioner

## 2021-11-27 ENCOUNTER — Encounter: Payer: Self-pay | Admitting: Nurse Practitioner

## 2021-11-27 VITALS — BP 130/60 | HR 80 | Ht 69.0 in | Wt 270.0 lb

## 2021-11-27 DIAGNOSIS — I480 Paroxysmal atrial fibrillation: Secondary | ICD-10-CM

## 2021-11-27 DIAGNOSIS — I5023 Acute on chronic systolic (congestive) heart failure: Secondary | ICD-10-CM | POA: Diagnosis present

## 2021-11-27 DIAGNOSIS — I10 Essential (primary) hypertension: Secondary | ICD-10-CM

## 2021-11-27 DIAGNOSIS — I5032 Chronic diastolic (congestive) heart failure: Secondary | ICD-10-CM | POA: Diagnosis not present

## 2021-11-27 DIAGNOSIS — E119 Type 2 diabetes mellitus without complications: Secondary | ICD-10-CM

## 2021-11-27 DIAGNOSIS — I25118 Atherosclerotic heart disease of native coronary artery with other forms of angina pectoris: Secondary | ICD-10-CM

## 2021-11-27 DIAGNOSIS — Z8679 Personal history of other diseases of the circulatory system: Secondary | ICD-10-CM | POA: Diagnosis present

## 2021-11-27 DIAGNOSIS — Z794 Long term (current) use of insulin: Secondary | ICD-10-CM

## 2021-11-27 DIAGNOSIS — I5033 Acute on chronic diastolic (congestive) heart failure: Secondary | ICD-10-CM | POA: Diagnosis present

## 2021-11-27 DIAGNOSIS — E785 Hyperlipidemia, unspecified: Secondary | ICD-10-CM

## 2021-11-27 LAB — BASIC METABOLIC PANEL
Anion gap: 9 (ref 5–15)
BUN: 20 mg/dL (ref 8–23)
CO2: 23 mmol/L (ref 22–32)
Calcium: 8.8 mg/dL — ABNORMAL LOW (ref 8.9–10.3)
Chloride: 102 mmol/L (ref 98–111)
Creatinine, Ser: 1.07 mg/dL (ref 0.61–1.24)
GFR, Estimated: >60 mL/min (ref >60)
Glucose, Bld: 358 mg/dL — ABNORMAL HIGH (ref 70–99)
Potassium: 4.1 mmol/L (ref 3.5–5.1)
Sodium: 134 mmol/L — ABNORMAL LOW (ref 135–145)

## 2021-11-27 LAB — BRAIN NATRIURETIC PEPTIDE: B Natriuretic Peptide: 60.3 pg/mL (ref 0.0–100.0)

## 2021-11-27 NOTE — Progress Notes (Signed)
? ? ?Office Visit  ?  ?Patient Name: Jonathan Cross [facility]. ?Date of Encounter: 11/27/2021 ? ?Primary Care Provider:  System, Provider Not In ?Primary Cardiologist:  Nelva Bush, MD ? ?Chief Complaint  ?  ?76 year old male with history of CAD status post CABG x2 (LIMA to the LAD, vein graft to the obtuse marginal), severe aortic stenosis status post bioprosthetic aortic valve replacement complicated by endocarditis, paroxysmal atrial fibrillation, hypertension, hyperlipidemia, HFpEF, type 2 diabetes mellitus, sleep apnea, and BPH, who presents for follow-up related to chronic dyspnea. ? ?Past Medical History  ?  ?Past Medical History:  ?Diagnosis Date  ? 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 Coronary artery disease   ? a. 01/2016 CABG x 2: LIMA->LAD, VG->OM; b. 05/2021 MV: small, mild, rev apical lateral and apical inf defects ->subtle ischemia vs artifact, EF 55-65%-->low risk.  ? Diabetes ([place])   ? H/O aortic valve replacement   ? a. 01/2016 s/p bioprosthetic AVR @ [facility] for Severe AS; b. 05/2021 Echo: EF 55-60%, no rwma, mild LVH, nl RV fxn, nl fxn of bioprosthetic AVR.  ? History of kidney stones   ? Hx of CABG   ? Hyperlipidemia   ? Hypertension   ? PAF (paroxysmal atrial fibrillation) ([place])   ? a. CHA2DS2VASc = 5-->eliquis/amio.  ? Sleep apnea   ? ?Past Surgical History:  ?Procedure Laterality Date  ? AORTIC VALVE REPLACEMENT  2017  ? [facility], bioprosthetic  ? APPENDECTOMY    ? CARDIAC VALVE REPLACEMENT    ? CORONARY ARTERY BYPASS GRAFT  2017  ? [facility] - LIMA-LAD and SVG-OM  ? CYSTOSCOPY W/ RETROGRADES Right 05/27/2019  ? Procedure: CYSTOSCOPY WITH RETROGRADE PYELOGRAM;  Surgeon: Billey Co, MD;  Location: [facility];  Service: Urology;  Laterality: Right;  ? CYSTOSCOPY W/ RETROGRADES Right 07/11/2019  ? Procedure: CYSTOSCOPY WITH RETROGRADE PYELOGRAM;  Surgeon: Billey Co, MD;  Location: [facility];  Service: Urology;  Laterality: Right;  ? CYSTOSCOPY/URETEROSCOPY/HOLMIUM LASER/STENT PLACEMENT Right 05/27/2019   ? Procedure: CYSTOSCOPY/URETEROSCOPY/HOLMIUM LASER/STENT PLACEMENT;  Surgeon: Billey Co, MD;  Location: [facility];  Service: Urology;  Laterality: Right;  ? CYSTOSCOPY/URETEROSCOPY/HOLMIUM LASER/STENT PLACEMENT Right 06/17/2019  ? Procedure: CYSTOSCOPY/URETEROSCOPY/HOLMIUM LASER/STENT Exchange;  Surgeon: Billey Co, MD;  Location: [facility];  Service: Urology;  Laterality: Right;  ? CYSTOSCOPY/URETEROSCOPY/HOLMIUM LASER/STENT PLACEMENT Right 07/11/2019  ? Procedure: CYSTOSCOPY/URETEROSCOPY/HOLMIUM LASER/STENT Exchange;  Surgeon: Billey Co, MD;  Location: [facility];  Service: Urology;  Laterality: Right;  ? STONE EXTRACTION WITH BASKET Right 07/11/2019  ? Procedure: STONE EXTRACTION WITH BASKET;  Surgeon: Billey Co, MD;  Location: [facility];  Service: Urology;  Laterality: Right;  ? TEE WITHOUT CARDIOVERSION N/A 04/21/2019  ? Procedure: TRANSESOPHAGEAL ECHOCARDIOGRAM (TEE);  Surgeon: Minna Merritts, MD;  Location: [facility];  Service: Cardiovascular;  Laterality: N/A;  ? TEE WITHOUT CARDIOVERSION N/A 07/04/2019  ? Procedure: TRANSESOPHAGEAL ECHOCARDIOGRAM (TEE);  Surgeon: Wellington Hampshire, MD;  Location: [facility];  Service: Cardiovascular;  Laterality: N/A;  ? TEE WITHOUT CARDIOVERSION N/A 08/17/2019  ? Procedure: TRANSESOPHAGEAL ECHOCARDIOGRAM (TEE);  Surgeon: Kate Sable, MD;  Location: [facility];  Service: Cardiovascular;  Laterality: N/A;  ? TEE WITHOUT CARDIOVERSION N/A 11/15/2019  ? Procedure: TRANSESOPHAGEAL ECHOCARDIOGRAM (TEE);  Surgeon: Nelva Bush, MD;  Location: [facility];  Service: Cardiovascular;  Laterality: N/A;  ? TONSILLECTOMY    ? ? ?Allergies ? ?Allergies  ?Allergen Reactions  ? Metformin Diarrhea  ?  Loose stools even with XR  ? ? ?History of Present Illness  ?  ?  75 year old male with the above past medical history including CAD status post CABG x2, severe aortic stenosis status post bioprosthetic aortic valve replacement, bacterial endocarditis, paroxysmal atrial  fibrillation, hypertension, hyperlipidemia, type 2 diabetes mellitus, sleep apnea, and BPH.  He was previously followed at [facility] in the setting of severe aortic stenosis, underwent bioprosthetic aortic valve replacement and two-vessel coronary bypass grafting in July 2017.  Between October 2020 and May 2021, he had multiple admissions related to fever, malaise, and Enterococcus faecalis bacteremia.  Transesophageal echocardiograms in October 2020 and December 2020, were negative for vegetation.  In February 2021, he was found to have a mobile mass on his prosthetic aortic valve, and was seen by infectious disease and treated with appropriate antibiotics for 6 weeks in the outpatient setting.  Prior to PICC line removal, repeat TEE in May 2021 continued to show an echodensity that appeared less mobile but larger, measuring 1.1 x 0.6 cm.  Perivalvular abscess could not be excluded.  He was transferred to [facility] where cardiac CT showed evidence of a small mass in the leaflet of the noncoronary cusp, likely representing a healed vegetation.  Leaflets were freely movable without evidence of obstruction.  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  The LIMA to the LAD and vein graft to the obtuse marginal were patent.  He was seen by CT surgery who recommended continued observation and antibiotic therapy.  In November 2022, he was admitted with dyspnea and chest fullness and found to be in rapid atrial fibrillation, which converted to sinus rhythm on intravenous amiodarone.  He underwent stress testing in the setting of chest pain, which showed a small, mild, reversible apical lateral and apical inferior defect with question of subtle ischemia versus infarct.  Ultimately, it was felt to be a low risk study and he was medically managed. ? ?In April of this year, he was evaluated with progressive dyspnea and a 30 pound weight gain.  Lasix 40  mg daily was added.  At follow-up April 21, he felt very weak and he was referred to the emergency department, where work-up was unremarkable and he was discharged home.  At last follow-up on May 3, he was felt to be edematous and his Lasix was increased to 40 mg twice daily for a week.  Blood cultures were sent off and return growth.  Today, his weight is similar to at his last visit-270 pounds.  Patient does not manage his own medications, and he is not sure if his daughter changed his Lasix after the last visit.  He continues to have dyspnea on exertion which today, he says is stable over many years.  He also has intermittent chest discomfort, mostly while sitting, which is stable dating back to his CABG and AVR.  He denies palpitations, PND, orthopnea, dizziness, syncope, or early satiety. ? ?Home Medications  ?  ?Current Outpatient Medications  ?Medication Sig Dispense Refill  ? amiodarone (PACERONE) 200 MG tablet Take 1 tablet (200 mg total) by mouth daily. 90 tablet 1  ? aspirin EC 81 MG tablet Take 81 mg by mouth daily.    ? atorvastatin (LIPITOR) 80 MG tablet Take 1 tablet (80 mg total) by mouth every evening. 90 tablet 3  ? ELIQUIS 5 MG TABS tablet Take 1 tablet (5 mg total) by mouth 2 (two) times daily. 180 tablet 3  ? furosemide (LASIX) 40 MG tablet Take 1 tablet (40  mg total) by mouth daily. 90 tablet 3  ? insulin NPH-regular Human (70-30) 100 UNIT/ML injection Inject 120 Units into the skin 2 (two) times daily.    ? isosorbide mononitrate (IMDUR) 30 MG 24 hr tablet Take 0.5 tablets (15 mg total) by mouth daily. 45 tablet 3  ? loratadine (CLARITIN) 10 MG tablet Take 10 mg by mouth daily.    ? Magnesium Oxide 400 MG CAPS Take 1 capsule (400 mg total) by mouth daily. 90 capsule 3  ? meclizine (ANTIVERT) 25 MG tablet Take 1 tablet (25 mg total) by mouth 3 (three) times daily as needed for dizziness. 20 tablet 0  ? metoprolol succinate (TOPROL-XL) 25 MG 24 hr tablet Take 1-2 tablets (25-50 mg total) by  mouth See admin instructions. Take 1 tablet ('25mg'$ ) by mouth every morning and take 2 tablets ('50mg'$ ) by mouth every evening 270 tablet 3  ? nitroGLYCERIN (NITROSTAT) 0.4 MG SL tablet Place 0.4 mg under the t

## 2021-11-27 NOTE — Patient Instructions (Signed)
Medication Instructions:  ?No changes at this time.  ? ?*If you need a refill on your cardiac medications before your next appointment, please call your pharmacy* ? ? ?Lab Work: ?BMET & BNP today over at the [facility] entrance and check in at registration.  ? ?If you have labs (blood work) drawn today and your tests are completely normal, you will receive your results only by: ?MyChart Message (if you have MyChart) OR ?A paper copy in the mail ?If you have any lab test that is abnormal or we need to change your treatment, we will call you to review the results. ? ? ?Testing/Procedures: ?None ? ? ?Follow-Up: ?At [facility], you and your health needs are our priority.  As part of our continuing mission to provide you with exceptional heart care, we have created designated Provider Care Teams.  These Care Teams include your primary Cardiologist (physician) and Advanced Practice Providers (APPs -  Physician Assistants and Nurse Practitioners) who all work together to provide you with the care you need, when you need it. ? ? ? ?Your next appointment:   ?1 month(s) ? ?The format for your next appointment:   ?In Person ? ?Provider:   ?Nelva Bush, MD or Murray Hodgkins, NP  ? ? ? ? ? ? ?Important Information About Sugar ? ? ? ? ?  ?

## 2021-12-21 ENCOUNTER — Other Ambulatory Visit: Payer: Self-pay | Admitting: Internal Medicine

## 2022-01-02 ENCOUNTER — Encounter: Payer: Self-pay | Admitting: Nurse Practitioner

## 2022-01-02 ENCOUNTER — Ambulatory Visit: Payer: Medicare HMO | Admitting: Nurse Practitioner

## 2022-02-17 DIAGNOSIS — G8929 Other chronic pain: Secondary | ICD-10-CM | POA: Insufficient documentation

## 2022-02-17 DIAGNOSIS — I2089 Other forms of angina pectoris: Secondary | ICD-10-CM | POA: Insufficient documentation

## 2022-04-01 ENCOUNTER — Other Ambulatory Visit: Payer: Self-pay | Admitting: Internal Medicine

## 2022-04-03 ENCOUNTER — Encounter: Payer: Self-pay | Admitting: Medical

## 2022-04-03 ENCOUNTER — Ambulatory Visit: Payer: Medicare HMO | Attending: Nurse Practitioner | Admitting: Medical

## 2022-04-03 VITALS — BP 108/65 | HR 81 | Ht 69.0 in | Wt 259.0 lb

## 2022-04-03 DIAGNOSIS — I5032 Chronic diastolic (congestive) heart failure: Secondary | ICD-10-CM | POA: Diagnosis not present

## 2022-04-03 DIAGNOSIS — I25118 Atherosclerotic heart disease of native coronary artery with other forms of angina pectoris: Secondary | ICD-10-CM | POA: Diagnosis not present

## 2022-04-03 DIAGNOSIS — Z952 Presence of prosthetic heart valve: Secondary | ICD-10-CM

## 2022-04-03 DIAGNOSIS — I480 Paroxysmal atrial fibrillation: Secondary | ICD-10-CM

## 2022-04-03 MED ORDER — RANOLAZINE ER 500 MG PO TB12: 500.0000 mg | ORAL_TABLET | Freq: Two times a day (BID) | ORAL | 5 refills | Status: DC

## 2022-04-03 NOTE — Progress Notes (Signed)
Cardiology Office Note:    Date:  04/03/2022   ID:  Jonathan James., DOB [birth year], MRN [id number]  PCP:  System, Provider Not In  [facility] Cardiologist:  Nelva Bush, MD  [place] Electrophysiologist:  None   Referring MD: No ref. provider found   Chief Complaint: 3 month follow-up  History of Present Illness:    Jonathan Cherne. is a 76 y.o. 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  He was previously followed at [facility] in the setting of severe aortic stenosis, underwent bioprosthetic aortic valve replacement and two-vessel coronary bypass grafting in July 2017.  Between October 2020 and May 2021, he had multiple admissions related to fever, malaise, and Enterococcus faecalis bacteremia.  Transesophageal echocardiograms in October 2020 and December 2020, were negative for vegetation.  In February 2021, he was found to have a mobile mass on his prosthetic aortic valve, and was seen by infectious disease and treated with appropriate antibiotics for 6 weeks in the outpatient setting.  Prior to PICC line removal, repeat TEE in May 2021 continued to show an echodensity that appeared less mobile but larger, measuring 1.1 x 0.6 cm.  Perivalvular abscess could not be excluded.  He was transferred to [facility] where cardiac CT showed evidence of a small mass in the leaflet of the noncoronary cusp, likely representing a healed vegetation.  Leaflets were freely movable without evidence of obstruction.  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  The LIMA to the LAD and vein graft to the obtuse marginal  were patent.  He was seen by CT surgery who recommended continued observation and antibiotic therapy.  In November 2022, he was admitted with dyspnea and chest fullness and found to be in rapid atrial fibrillation, which converted to sinus rhythm on intravenous amiodarone.  He underwent stress testing in the setting of chest pain, which showed a small, mild, reversible apical lateral and apical inferior defect with question of subtle ischemia versus infarct.  Ultimately, it was felt to be a low risk study and he was medically managed.   In April of this year, he was evaluated with progressive dyspnea and a 30 pound weight gain.  Lasix 40 mg daily was added.  At follow-up April 21, he felt very weak and he was referred to the emergency department, where work-up was unremarkable and he was discharged home.  At last follow-up on May 3, he was felt to be edematous and his Lasix was increased to 40 mg twice daily for a week.  Blood cultures were sent off and return growth. He was seen back 11/27/21 and weight was about the same at 270lbs, unsure if lasix dose was ever increased. BNP was normal, same lasix dose was continued. R&L heart cath was discussed, but patient was not wanting to pursue this.   Today, the patient reports chronic chest pain, however it has been worse over the last 3-4 weeks. It's worse when he walks and it improves with rest. He describes it as a pressure. Has mild shortness of breath. No nausea or vomiting. He denies lower leg edema, orthopnea, or pnd. The patient is not wanting to  repeat heart catheterization.   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 01/2016 CABG x 2: LIMA->LAD, VG->OM; b. 05/2021 MV: small, mild, rev apical lateral and apical inf defects ->subtle ischemia vs artifact, EF 55-65%-->low risk.   Diabetes ([place])    H/O aortic valve replacement    a. 01/2016 s/p bioprosthetic AVR @ [facility] for Severe AS; b. 05/2021  Echo: EF 55-60%, no rwma, mild LVH, nl RV fxn, nl fxn of bioprosthetic AVR.   History of kidney stones    Hx of CABG    Hyperlipidemia    Hypertension    PAF (paroxysmal atrial fibrillation) (HCC)    a. CHA2DS2VASc = 5-->eliquis/amio.   Sleep apnea     Past Surgical History:  Procedure Laterality Date   AORTIC VALVE REPLACEMENT  2017   [facility], bioprosthetic   APPENDECTOMY     CARDIAC VALVE REPLACEMENT     CORONARY ARTERY BYPASS GRAFT  2017   [facility] - LIMA-LAD and SVG-OM   CYSTOSCOPY W/ RETROGRADES Right 05/27/2019   Procedure: CYSTOSCOPY WITH RETROGRADE PYELOGRAM;  Surgeon: Billey Co, MD;  Location: [facility] ORS;  Service: Urology;  Laterality: Right;   CYSTOSCOPY W/ RETROGRADES Right 07/11/2019   Procedure: CYSTOSCOPY WITH RETROGRADE PYELOGRAM;  Surgeon: Billey Co, MD;  Location: [facility] OR[facility];  Service: Urology;  Laterality: Right;   CYSTOSCOPY/URETEROSCOPY/HOLMIUM LASER/STENT PLACEMENT Right 05/27/2019   Procedure: CYSTOSCOPY/URETEROSCOPY/HOLMIUM LASER/STENT PLACEMENT;  Surgeon: Billey Co, MD;  Location: [facility] OR[facility];  Service: Urology;  Laterality: Right;   CYSTOSCOPY/URETEROSCOPY/HOLMIUM LASER/STENT PLACEMENT Right 06/17/2019   Procedure: CYSTOSCOPY/URETEROSCOPY/HOLMIUM LASER/STENT Exchange;  Surgeon: Billey Co, MD;  Location: [facility] OR[facility];  Service: Urology;  Laterality: Right;   CYSTOSCOPY/URETEROSCOPY/HOLMIUM LASER/STENT PLACEMENT Right 07/11/2019   Procedure: CYSTOSCOPY/URETEROSCOPY/HOLMIUM LASER/STENT Exchange;  Surgeon: Billey Co, MD;  Location: [facility];  Service: Urology;  Laterality: Right;   STONE EXTRACTION WITH BASKET Right 07/11/2019   Procedure: STONE EXTRACTION WITH BASKET;  Surgeon: Billey Co, MD;  Location: [facility] OR[facility];  Service: Urology;  Laterality: Right;   TEE WITHOUT CARDIOVERSION N/A 04/21/2019   Procedure: TRANSESOPHAGEAL ECHOCARDIOGRAM (TEE);  Surgeon: Minna Merritts, MD;  Location: [facility];  Service: Cardiovascular;  Laterality: N/A;    TEE WITHOUT CARDIOVERSION N/A 07/04/2019   Procedure: TRANSESOPHAGEAL ECHOCARDIOGRAM (TEE);  Surgeon: Wellington Hampshire, MD;  Location: [facility];  Service: Cardiovascular;  Laterality: N/A;   TEE WITHOUT CARDIOVERSION N/A 08/17/2019   Procedure: TRANSESOPHAGEAL ECHOCARDIOGRAM (TEE);  Surgeon: Kate Sable, MD;  Location: [facility];  Service: Cardiovascular;  Laterality: N/A;   TEE WITHOUT CARDIOVERSION N/A 11/15/2019   Procedure: TRANSESOPHAGEAL ECHOCARDIOGRAM (TEE);  Surgeon: Nelva Bush, MD;  Location: [facility];  Service: Cardiovascular;  Laterality: N/A;   TONSILLECTOMY      Current Medications: Current Meds  Medication Sig   amiodarone (PACERONE) 200 MG tablet Take 1 tablet by mouth once daily   aspirin EC 81 MG tablet Take 81 mg by mouth daily.   atorvastatin (LIPITOR) 80 MG tablet Take 1 tablet (80 mg total) by mouth every evening.   ELIQUIS 5 MG TABS tablet Take 1 tablet (5 mg total) by mouth 2 (two) times daily.   furosemide (LASIX) 40 MG tablet Take 1 tablet (40 mg total) by mouth daily.   insulin NPH-regular Human (70-30) 100 UNIT/ML injection Inject 120 Units into the skin 2 (two) times daily.   isosorbide mononitrate (IMDUR) 30 MG 24 hr tablet Take 0.5 tablets (15  mg total) by mouth daily.   loratadine (CLARITIN) 10 MG tablet Take 10 mg by mouth daily.   Magnesium Oxide 400 MG CAPS Take 1 capsule (400 mg total) by mouth daily.   meclizine (ANTIVERT) 25 MG tablet Take 1 tablet (25 mg total) by mouth 3 (three) times daily as needed for dizziness.   metoprolol succinate (TOPROL-XL) 25 MG 24 hr tablet Take 1-2 tablets (25-50 mg total) by mouth See admin instructions. Take 1 tablet ('25mg'$ ) by mouth every morning and take 2 tablets ('50mg'$ ) by mouth every evening   nitroGLYCERIN (NITROSTAT) 0.4 MG SL tablet Place 0.4 mg under the tongue every 5 (five) minutes x 3 doses as needed for chest pain.    omeprazole (PRILOSEC) 20 MG capsule Take 2 capsules (40 mg total) by mouth daily.    Potassium Citrate 15 MEQ (1620 MG) TBCR Take 2 tablets by mouth 2 (two) times daily.   ranolazine (RANEXA) 500 MG 12 hr tablet Take 1 tablet (500 mg total) by mouth 2 (two) times daily.   tamsulosin (FLOMAX) 0.4 MG CAPS capsule Take 0.4 mg by mouth daily.     Allergies:   Metformin   Social History   Socioeconomic History   Marital status: Married    Spouse name: Not on file   Number of children: Not on file   Years of education: Not on file   Highest education level: Not on file  Occupational History   Not on file  Tobacco Use   Smoking status: Former   Smokeless tobacco: Never  Vaping Use   Vaping Use: Never used  Substance and Sexual Activity   Alcohol use: Never   Drug use: Never   Sexual activity: Not on file  Other Topics Concern   Not on file  Social History Narrative   Not on file   Social Determinants of Health   Financial Resource Strain: Not on file  Food Insecurity: Not on file  Transportation Needs: Not on file  Physical Activity: Not on file  Stress: Not on file  Social Connections: Not on file     Family History: The patient's family history includes Heart attack in his sister; Heart attack (age of onset: 64) in his father.  ROS:   Please see the history of present illness.     All other systems reviewed and are negative.  EKGs/Labs/Other Studies Reviewed:    The following studies were reviewed today:  Echo 2022  1. Left ventricular ejection fraction, by estimation, is 55 to 60%. The  left ventricle has normal function. The left ventricle has no regional  wall motion abnormalities. There is mild left ventricular hypertrophy.  Left ventricular diastolic parameters  are indeterminate.   2. Right ventricular systolic function is normal. The right ventricular  size is normal. Tricuspid regurgitation signal is inadequate for assessing  PA pressure.   3. The mitral valve is normal in structure. No evidence of mitral valve  regurgitation. No  evidence of mitral stenosis.   4. The aortic valve has been repaired/replaced. Aortic valve  regurgitation is not visualized. No aortic stenosis is present. Echo  findings are consistent with normal structure and function of the aortic  valve prosthesis.   5. The inferior vena cava is normal in size with greater than 50%  respiratory variability, suggesting right atrial pressure of 3 mmHg.   Myoview Lexiscan 2022 Narrative & Impression      Low risk, probably abnormal pharmacologic myocardial perfusion stress test.   There  is a small in size, mild in severity, reversible defect involving the apical lateral and apical inferior segments most consistent with subtle ischemia but cannot rule out artifact.   There is no significant scar.   Left ventricular function is normal (LVEF 55-65%).   Attenuation correction CT demonstrates coronary artery calcification, aortic atherosclerosis, and sequelae of prior CABG and aortic valve replacement.       EKG:  EKG is  ordered today.  The ekg ordered today demonstrates NSR 81bpm, LAD, iRBBB, TWI aVL, septal q waves, no changes  Recent Labs: 10/25/2021: Magnesium 1.8; TSH 3.610 11/01/2021: ALT 33 11/13/2021: Hemoglobin 11.6; Platelets 129 11/27/2021: B Natriuretic Peptide 60.3; BUN 20; Creatinine, Ser 1.07; Potassium 4.1; Sodium 134  Recent Lipid Panel    Component Value Date/Time   CHOL 92 06/03/2021 0429   TRIG 134 06/03/2021 0429   HDL 16 (L) 06/03/2021 0429   CHOLHDL 5.8 06/03/2021 0429   VLDL 27 06/03/2021 0429   LDLCALC 49 06/03/2021 0429     Physical Exam:    VS:  BP 108/65 (BP Location: Right Arm, Patient Position: Sitting, Cuff Size: Large)   Pulse 81   Ht '5\' 9"'$  (1.753 m)   Wt 259 lb (117.5 kg)   SpO2 94%   BMI 38.25 kg/m     Wt Readings from Last 3 Encounters:  04/03/22 259 lb (117.5 kg)  11/27/21 270 lb (122.5 kg)  11/13/21 271 lb (122.9 kg)     GEN:  Well nourished, well developed in no acute distress HEENT:  Normal NECK: No JVD; No carotid bruits LYMPHATICS: No lymphadenopathy CARDIAC: RRR, no murmurs, rubs, gallops RESPIRATORY:  Clear to auscultation without rales, wheezing or rhonchi  ABDOMEN: Soft, non-tender, non-distended MUSCULOSKELETAL:  No edema; No deformity  SKIN: Warm and dry NEUROLOGIC:  Alert and oriented x 3 PSYCHIATRIC:  Normal affect   ASSESSMENT:    1. Coronary artery disease of native artery of native heart with stable angina pectoris ([place])   2. Chronic diastolic heart failure (HCC)   3. Paroxysmal A-fib ([place])   4. S/P aortic valve replacement    PLAN:    In order of problems listed above:  CAD s/p CABG x2 2017 with stable angina The patient reports worsening chest pain over the last 3-4 weeks. In November 2022 he had a low risk stress test with small mild reversible apical lateral and apical defects. Heart cath has been discussed in the past, but deferred. Again we discussed R&L heart cath, but patient is not wanting to pursue this. Continue Aspirin, Lipitor, Toprol and Imdur. BP is soft today. I will add Ranexa '500mg'$  daily.   Chronic HFpEF Echo in 05/2021 showed LVEF 55-60%. He is on lasix '40mg'$  daily. He appears euvolemic on exam. Continue Toprol.   Paroxysmal Afib He is maintaining NSR on amiodarone. Continue Eliquis for stroke ppx.   Aortic stenosis S/p bioprosthetic valve complicated by endocarditis. Most recent echo showed normal functioning valve in November 2022.   Disposition: Follow up in 3 month(s) with Md/APP    Signed, Jonathan Vandekamp Ninfa Meeker, PA-C  04/03/2022 3:28 PM    [facility]

## 2022-04-03 NOTE — Patient Instructions (Signed)
Medication Instructions:  Your physician has recommended you make the following change in your medication:   START Ranexa 500 mg twice a day. An Rx has been sent to your pharmacy.   *If you need a refill on your cardiac medications before your next appointment, please call your pharmacy*   Lab Work: None ordered If you have labs (blood work) drawn today and your tests are completely normal, you will receive your results only by: [place] (if you have MyChart) OR A paper copy in the mail If you have any lab test that is abnormal or we need to change your treatment, we will call you to review the results.   Testing/Procedures: None ordered   Follow-Up: At [facility], you and your health needs are our priority.  As part of our continuing mission to provide you with exceptional heart care, we have created designated Provider Care Teams.  These Care Teams include your primary Cardiologist (physician) and Advanced Practice Providers (APPs -  Physician Assistants and Nurse Practitioners) who all work together to provide you with the care you need, when you need it.  We recommend signing up for the patient portal called "MyChart".  Sign up information is provided on this After Visit Summary.  MyChart is used to connect with patients for Virtual Visits (Telemedicine).  Patients are able to view lab/test results, encounter notes, upcoming appointments, etc.  Non-urgent messages can be sent to your provider as well.   To learn more about what you can do with MyChart, go to [email or web address].    Your next appointment:   3 month(s)  The format for your next appointment:   In Person  Provider:   You may see Nelva Bush, MD or one of the following Advanced Practice Providers on your designated Care Team:   Murray Hodgkins, NP Christell Faith, PA-C Cadence Kathlen Mody, PA-C Gerrie Nordmann, NP   Other Instructions N/A  Important Information About Sugar

## 2022-04-13 DIAGNOSIS — D46Z Other myelodysplastic syndromes: Secondary | ICD-10-CM

## 2022-04-13 HISTORY — DX: Other myelodysplastic syndromes: D46.Z

## 2022-04-28 ENCOUNTER — Telehealth: Payer: Self-pay | Admitting: Internal Medicine

## 2022-04-28 NOTE — Telephone Encounter (Addendum)
Spoke with patients daughter per release form. She states patient would like to proceed with having R/L heart cath. Advised that we will need to see him here in the office before we can schedule. Scheduled him to come in next week on 05/08/22 at 10:55 am with APP. She confirmed and repeated back date and time. Recommended that she try to come in with him for instructions and discussion about this procedure. She verbalized understanding and will try to be there. No further questions at this time.

## 2022-04-28 NOTE — Telephone Encounter (Signed)
Patient's daughter states the patient's PCP was able to convince him to have a cath done. She would like to schedule it as soon as possible.

## 2022-05-01 ENCOUNTER — Encounter: Payer: Self-pay | Admitting: Intensive Care

## 2022-05-01 ENCOUNTER — Other Ambulatory Visit: Payer: Self-pay

## 2022-05-01 ENCOUNTER — Observation Stay
Admission: EM | Admit: 2022-05-01 | Discharge: 2022-05-02 | Disposition: A | Discharge status: Home / Self Care | Payer: Medicare HMO | Attending: Osteopathic Medicine | Admitting: Osteopathic Medicine

## 2022-05-01 ENCOUNTER — Emergency Department: Payer: Medicare HMO

## 2022-05-01 DIAGNOSIS — I110 Hypertensive heart disease with heart failure: Secondary | ICD-10-CM | POA: Diagnosis not present

## 2022-05-01 DIAGNOSIS — R778 Other specified abnormalities of plasma proteins: Secondary | ICD-10-CM | POA: Insufficient documentation

## 2022-05-01 DIAGNOSIS — I5032 Chronic diastolic (congestive) heart failure: Secondary | ICD-10-CM | POA: Diagnosis not present

## 2022-05-01 DIAGNOSIS — R0789 Other chest pain: Secondary | ICD-10-CM | POA: Diagnosis present

## 2022-05-01 DIAGNOSIS — R0600 Dyspnea, unspecified: Secondary | ICD-10-CM

## 2022-05-01 DIAGNOSIS — D649 Anemia, unspecified: Principal | ICD-10-CM | POA: Diagnosis present

## 2022-05-01 DIAGNOSIS — Z79899 Other long term (current) drug therapy: Secondary | ICD-10-CM | POA: Insufficient documentation

## 2022-05-01 DIAGNOSIS — R2681 Unsteadiness on feet: Secondary | ICD-10-CM | POA: Diagnosis not present

## 2022-05-01 DIAGNOSIS — E785 Hyperlipidemia, unspecified: Secondary | ICD-10-CM | POA: Diagnosis present

## 2022-05-01 DIAGNOSIS — Z87891 Personal history of nicotine dependence: Secondary | ICD-10-CM | POA: Insufficient documentation

## 2022-05-01 DIAGNOSIS — I2510 Atherosclerotic heart disease of native coronary artery without angina pectoris: Secondary | ICD-10-CM | POA: Diagnosis present

## 2022-05-01 DIAGNOSIS — I10 Essential (primary) hypertension: Secondary | ICD-10-CM | POA: Diagnosis present

## 2022-05-01 DIAGNOSIS — E119 Type 2 diabetes mellitus without complications: Secondary | ICD-10-CM | POA: Diagnosis not present

## 2022-05-01 DIAGNOSIS — G4733 Obstructive sleep apnea (adult) (pediatric): Secondary | ICD-10-CM | POA: Diagnosis present

## 2022-05-01 DIAGNOSIS — Z7901 Long term (current) use of anticoagulants: Secondary | ICD-10-CM | POA: Diagnosis not present

## 2022-05-01 DIAGNOSIS — Z952 Presence of prosthetic heart valve: Secondary | ICD-10-CM | POA: Diagnosis not present

## 2022-05-01 DIAGNOSIS — R531 Weakness: Secondary | ICD-10-CM | POA: Diagnosis not present

## 2022-05-01 DIAGNOSIS — Z951 Presence of aortocoronary bypass graft: Secondary | ICD-10-CM | POA: Insufficient documentation

## 2022-05-01 DIAGNOSIS — Z7982 Long term (current) use of aspirin: Secondary | ICD-10-CM | POA: Diagnosis not present

## 2022-05-01 DIAGNOSIS — R8290 Unspecified abnormal findings in urine: Secondary | ICD-10-CM | POA: Insufficient documentation

## 2022-05-01 DIAGNOSIS — N400 Enlarged prostate without lower urinary tract symptoms: Secondary | ICD-10-CM | POA: Diagnosis present

## 2022-05-01 DIAGNOSIS — R2689 Other abnormalities of gait and mobility: Secondary | ICD-10-CM | POA: Insufficient documentation

## 2022-05-01 DIAGNOSIS — Z794 Long term (current) use of insulin: Secondary | ICD-10-CM | POA: Diagnosis not present

## 2022-05-01 DIAGNOSIS — R296 Repeated falls: Secondary | ICD-10-CM | POA: Diagnosis not present

## 2022-05-01 DIAGNOSIS — Z1152 Encounter for screening for COVID-19: Secondary | ICD-10-CM | POA: Diagnosis not present

## 2022-05-01 DIAGNOSIS — K219 Gastro-esophageal reflux disease without esophagitis: Secondary | ICD-10-CM | POA: Diagnosis present

## 2022-05-01 DIAGNOSIS — I25118 Atherosclerotic heart disease of native coronary artery with other forms of angina pectoris: Secondary | ICD-10-CM | POA: Insufficient documentation

## 2022-05-01 DIAGNOSIS — I480 Paroxysmal atrial fibrillation: Secondary | ICD-10-CM | POA: Diagnosis not present

## 2022-05-01 DIAGNOSIS — D7589 Other specified diseases of blood and blood-forming organs: Secondary | ICD-10-CM | POA: Insufficient documentation

## 2022-05-01 DIAGNOSIS — R079 Chest pain, unspecified: Secondary | ICD-10-CM

## 2022-05-01 DIAGNOSIS — R7989 Other specified abnormal findings of blood chemistry: Secondary | ICD-10-CM | POA: Diagnosis present

## 2022-05-01 LAB — URINALYSIS, COMPLETE (UACMP) WITH MICROSCOPIC
Bilirubin Urine: NEGATIVE
Glucose, UA: NEGATIVE mg/dL
Ketones, ur: NEGATIVE mg/dL
Nitrite: NEGATIVE
Protein, ur: 30 mg/dL — AB
Specific Gravity, Urine: 1.012 (ref 1.005–1.030)
WBC, UA: >50 WBC/hpf — ABNORMAL HIGH (ref 0–5)
pH: 5 (ref 5.0–8.0)

## 2022-05-01 LAB — CBC
HCT: 25 % — ABNORMAL LOW (ref 39.0–52.0)
Hemoglobin: 8 g/dL — ABNORMAL LOW (ref 13.0–17.0)
MCH: 36 pg — ABNORMAL HIGH (ref 26.0–34.0)
MCHC: 32 g/dL (ref 30.0–36.0)
MCV: 112.6 fL — ABNORMAL HIGH (ref 80.0–100.0)
Platelets: 168 10*3/uL (ref 150–400)
RBC: 2.22 MIL/uL — ABNORMAL LOW (ref 4.22–5.81)
RDW: 16.3 % — ABNORMAL HIGH (ref 11.5–15.5)
WBC: 4.9 10*3/uL (ref 4.0–10.5)
nRBC: 0.6 % — ABNORMAL HIGH (ref 0.0–0.2)

## 2022-05-01 LAB — BASIC METABOLIC PANEL
Anion gap: 9 (ref 5–15)
BUN: 14 mg/dL (ref 8–23)
CO2: 21 mmol/L — ABNORMAL LOW (ref 22–32)
Calcium: 7.9 mg/dL — ABNORMAL LOW (ref 8.9–10.3)
Chloride: 106 mmol/L (ref 98–111)
Creatinine, Ser: 1 mg/dL (ref 0.61–1.24)
GFR, Estimated: >60 mL/min (ref >60)
Glucose, Bld: 111 mg/dL — ABNORMAL HIGH (ref 70–99)
Potassium: 3.2 mmol/L — ABNORMAL LOW (ref 3.5–5.1)
Sodium: 136 mmol/L (ref 135–145)

## 2022-05-01 LAB — GLUCOSE, CAPILLARY: Glucose-Capillary: 101 mg/dL — ABNORMAL HIGH (ref 70–99)

## 2022-05-01 LAB — RETICULOCYTES
Immature Retic Fract: 40.6 % — ABNORMAL HIGH (ref 2.3–15.9)
RBC.: 2.41 MIL/uL — ABNORMAL LOW (ref 4.22–5.81)
Retic Count, Absolute: 113.5 10*3/uL (ref 19.0–186.0)
Retic Ct Pct: 4.7 % — ABNORMAL HIGH (ref 0.4–3.1)

## 2022-05-01 LAB — FERRITIN: Ferritin: 366 ng/mL — ABNORMAL HIGH (ref 24–336)

## 2022-05-01 LAB — ABO/RH: ABO/RH(D): B NEG

## 2022-05-01 LAB — PREPARE RBC (CROSSMATCH)

## 2022-05-01 LAB — IRON AND TIBC
Iron: 95 ug/dL (ref 45–182)
Saturation Ratios: 36 % (ref 17.9–39.5)
TIBC: 263 ug/dL (ref 250–450)
UIBC: 168 ug/dL

## 2022-05-01 LAB — SARS CORONAVIRUS 2 BY RT PCR: SARS Coronavirus 2 by RT PCR: NEGATIVE

## 2022-05-01 LAB — TROPONIN I (HIGH SENSITIVITY)
Troponin I (High Sensitivity): 32 ng/L — ABNORMAL HIGH (ref <18)
Troponin I (High Sensitivity): 32 ng/L — ABNORMAL HIGH (ref <18)

## 2022-05-01 LAB — PROTIME-INR
INR: 1.6 — ABNORMAL HIGH (ref 0.8–1.2)
Prothrombin Time: 18.6 seconds — ABNORMAL HIGH (ref 11.4–15.2)

## 2022-05-01 LAB — FOLATE: Folate: 10 ng/mL (ref >5.9)

## 2022-05-01 LAB — CBG MONITORING, ED: Glucose-Capillary: 126 mg/dL — ABNORMAL HIGH (ref 70–99)

## 2022-05-01 MED ORDER — ACETAMINOPHEN 325 MG PO TABS: 650.0000 mg | ORAL_TABLET | Freq: Four times a day (QID) | ORAL | Status: DC | PRN

## 2022-05-01 MED ORDER — ATORVASTATIN CALCIUM 20 MG PO TABS
80.0000 mg | ORAL_TABLET | Freq: Every evening | ORAL | Status: DC
  Administered 2022-05-01: 80 mg via ORAL
  Filled 2022-05-01: qty 4

## 2022-05-01 MED ORDER — INSULIN ASPART 100 UNIT/ML IJ SOLN: 0.0000 [IU] | Freq: Every day | INTRAMUSCULAR | Status: DC

## 2022-05-01 MED ORDER — LABETALOL HCL 5 MG/ML IV SOLN: 5.0000 mg | INTRAVENOUS | Status: DC | PRN

## 2022-05-01 MED ORDER — SENNOSIDES-DOCUSATE SODIUM 8.6-50 MG PO TABS: 1.0000 | ORAL_TABLET | Freq: Every evening | ORAL | Status: DC | PRN

## 2022-05-01 MED ORDER — SODIUM CHLORIDE 0.9 % IV SOLN
10.0000 mL/h | Freq: Once | INTRAVENOUS | Status: AC
  Administered 2022-05-01: 10 mL/h via INTRAVENOUS

## 2022-05-01 MED ORDER — ONDANSETRON HCL 4 MG/2ML IJ SOLN: 4.0000 mg | Freq: Four times a day (QID) | INTRAMUSCULAR | Status: DC | PRN

## 2022-05-01 MED ORDER — ONDANSETRON HCL 4 MG PO TABS: 4.0000 mg | ORAL_TABLET | Freq: Four times a day (QID) | ORAL | Status: DC | PRN

## 2022-05-01 MED ORDER — RANOLAZINE ER 500 MG PO TB12
500.0000 mg | ORAL_TABLET | Freq: Two times a day (BID) | ORAL | Status: DC
  Filled 2022-05-01: qty 1

## 2022-05-01 MED ORDER — MECLIZINE HCL 25 MG PO TABS: 25.0000 mg | ORAL_TABLET | Freq: Three times a day (TID) | ORAL | Status: DC | PRN

## 2022-05-01 MED ORDER — ACETAMINOPHEN 650 MG RE SUPP: 650.0000 mg | Freq: Four times a day (QID) | RECTAL | Status: DC | PRN

## 2022-05-01 MED ORDER — INSULIN ASPART 100 UNIT/ML IJ SOLN
0.0000 [IU] | Freq: Three times a day (TID) | INTRAMUSCULAR | Status: DC
  Administered 2022-05-02: 4 [IU] via SUBCUTANEOUS
  Administered 2022-05-02 (×2): 7 [IU] via SUBCUTANEOUS
  Filled 2022-05-01 (×3): qty 1

## 2022-05-01 MED ORDER — NITROGLYCERIN 0.4 MG SL SUBL: 0.4000 mg | SUBLINGUAL_TABLET | SUBLINGUAL | Status: DC | PRN

## 2022-05-01 NOTE — ED Notes (Signed)
Pt brought to ed rm 17 at this time. This RN now assuming care.

## 2022-05-01 NOTE — ED Notes (Signed)
Multiple IV attempts by staff at the [facility] and [place]. Another staff member is attempting at this time.

## 2022-05-01 NOTE — Hospital Course (Addendum)
Jonathan Cross is a 76 year old male with history of hyperlipidemia, hypertension, atrial fibrillation on Eliquis, GERD, insulin-dependent diabetes mellitus, CAD status post prior CABG, who presents emergency department for chief concerns of chest pain and shortness of breath. He reports that he has been having gradually worsening dyspnea with exertion and chest pressure.  He reports that chest pressure has been ongoing for the last 5 to 6 years. He was seen by his PCP and cardiologist last week.  PCP states at some point he will need a transfusion. Cardiologist has scheduled him for left heart cath next Thursday. 10/19: Initial vitals in the emergency department showed temperature of 97.7, respiration rate of 22, heart rate of 79, blood pressure 115/59, SPO2 of 97% on room air. Sodium is 136, potassium 3.2, chloride of 106, bicarb 21, BUN of 14, serum creatinine of 1.00, GFR greater than 60, nonfasting glucose 111, WBC 4.9, hemoglobin of 8, platelets of 168.  High sensitive troponin was 32 and on repeat is 32. ED treatment: PRBC, 1 unit ordered for transfusion. Cardiology consulted  10/20: Abn UA, UCx added, he confirmed he is having symptoms of urinary frequency and occasional dysuria. Cardiology consult: Echocardiogram normal LV function and wall motion, normal bioprosthetic aortic valve.  CAD with stable angina, no ACS.  Recommend maximizing antianginal therapy prior to considering invasive cardiac evaluation, restart Toprol and Imdur.  PT/OT evaluated, no follow-up needed.     Consultants:  Cardiology   Procedures: none      ASSESSMENT & PLAN:   Principal Problem:   Symptomatic anemia Active Problems:   Benign prostatic hyperplasia   Essential hypertension   OSA (obstructive sleep apnea)   S/P aortic valve replacement   Hyperlipidemia LDL goal <70   GERD (gastroesophageal reflux disease)   Coronary artery disease   Elevated troponin   Paroxysmal A-fib (HCC)   Chest  pressure  Symptomatic anemia Macrocytosis Baseline hemoglobin is 10-12 Hgb on admission 8.0 --(1 unit PRBC)--> Hgb 9.5 iron, ferritin, B12, Folate WNL Retic Ct elevated Monitor CBC   Chest pressure With stable high sensitive troponin Presumed secondary to symptomatic anemia  Cardiology consult: Echocardiogram normal LV function and wall motion, normal bioprosthetic aortic valve.  CAD with stable angina, no ACS.  Recommend maximizing antianginal therapy prior to considering invasive cardiac evaluation, restart Toprol and Imdur. Cleared for any neccessary endoscopic procedures   Abnormal UA UCx ordered   Paroxysmal A-fib (HCC) Holding home Eliquis at this time due to symptomatic anemia  Essential hypertension Metoprolol succinate 25-50 mg daily, 25 mg in the morning, 50 mg at night Imdur 30  Hyperlipidemia LDL goal <70 Atorvastatin 80 mg nightly resumed  OSA (obstructive sleep apnea) CPAP nightly ordered          DVT prophylaxis: TED hose  Pertinent IV fluids/nutrition: no continuous IV fluids  Central lines / invasive devices: none  Code Status: FULL CODE Family Communication: pt declined call to family/support persons   Disposition: observation TOC needs: none at this time Barriers to discharge / significant pending items: cardiology consult, echo results, question UTI

## 2022-05-01 NOTE — Assessment & Plan Note (Signed)
-   CPAP nightly ordered 

## 2022-05-01 NOTE — Assessment & Plan Note (Signed)
-   Atorvastatin 80 mg nightly resumed ?

## 2022-05-01 NOTE — ED Provider Notes (Signed)
[facility] Provider Note    Event Date/Time   First MD Initiated Contact with Patient 05/01/22 1635     (approximate)  History   Chief Complaint: Chest Pain  HPI  Jonathan Cross. is a 76 y.o. male with a past medical history of CAD, diabetes, prior CABG, hyperlipidemia, hypertension, paroxysmal atrial fibrillation on Eliquis who presents to the emergency department for weakness chest pain and shortness of breath.  According to the patient for the past 24 hours or so he has been experiencing chest pressure mostly in the center to left chest along with shortness of breath and weakness worse with exertion.  Patient denies any nausea does state intermittent episodes of near syncope.  Physical Exam   Triage Vital Signs: ED Triage Vitals  Enc Vitals Group     BP 05/01/22 1112 (!) 115/59     Pulse Rate 05/01/22 1112 79     Resp 05/01/22 1112 (!) 22     Temp 05/01/22 1112 97.7 F (36.5 C)     Temp Source 05/01/22 1112 Oral     SpO2 05/01/22 1112 97 %     Weight 05/01/22 1113 250 lb (113.4 kg)     Height 05/01/22 1113 '5\' 9"'$  (1.753 m)     Head Circumference --      Peak Flow --      Pain Score 05/01/22 1113 9     Pain Loc --      Pain Edu? --      Excl. in [place]? --     Most recent vital signs: Vitals:   05/01/22 1112  BP: (!) 115/59  Pulse: 79  Resp: (!) 22  Temp: 97.7 F (36.5 C)  SpO2: 97%    General: Awake, no distress.  CV:  Good peripheral perfusion.  Regular rate and rhythm  Resp:  Normal effort.  Equal breath sounds bilaterally.  Abd:  No distention.  Soft, nontender.  No rebound or guarding.   ED Results / Procedures / Treatments   EKG  EKG viewed and interpreted by myself shows a normal sinus rhythm at 77 bpm with a narrow QRS, normal axis, normal intervals, nonspecific ST changes without ST elevation.  RADIOLOGY  I have reviewed and interpreted the chest x-ray images I do not see any obvious consolidation on my  evaluation. Radiology has read the chest x-ray is negative for acute abnormality.   MEDICATIONS ORDERED IN ED: Medications  0.9 %  sodium chloride infusion (has no administration in time range)     IMPRESSION / MDM / ASSESSMENT AND PLAN / ED COURSE  I reviewed the triage vital signs and the nursing notes.  Patient's presentation is most consistent with acute presentation with potential threat to life or bodily function.  Patient presents to the emergency department for weakness, shortness of breath and chest discomfort ongoing over the past 24 hours or so.  Overall the patient appears well, no distress.  Reassuring vital signs.  Patient does states significant weakness even with minimal movement in bed.  Initial lab work shows a chemistry that is overall reassuring with slight hypokalemia.  Patient CBC however does show significant anemia with a hemoglobin of 8.0, down approximately 3-1/2 points from 5 months ago, very likely could be the cause of some of the patient's weakness shortness of breath and possibly even chest pain.  Given the patient's likely symptomatic anemia we will transfuse 1 unit of packed red blood cells.  I performed a  rectal exam showing light brown stool guaiac negative.  Initial troponin reassuring at 32 largely unchanged from historical values.  EKG and chest x-ray showed no significant finding.  However given the patient's chest discomfort weakness hemoglobin of 8.0 we will transfuse 1 unit of PRBCs we will likely admit to the hospitalist service for ongoing work-up and treatment.  I would like to obtain a urine sample and a COVID swab to rule out any infectious etiology.  Patient agreeable to plan of care.  Patient's COVID test is negative, I will discuss with the hospitalist for admission for likely symptomatic anemia continued mild chest discomfort and shortness of breath.   CRITICAL CARE Performed by: Harvest Dark   Total critical care time: 30  minutes  Critical care time was exclusive of separately billable procedures and treating other patients.  Critical care was necessary to treat or prevent imminent or life-threatening deterioration.  Critical care was time spent personally by me on the following activities: development of treatment plan with patient and/or surrogate as well as nursing, discussions with consultants, evaluation of patient's response to treatment, examination of patient, obtaining history from patient or surrogate, ordering and performing treatments and interventions, ordering and review of laboratory studies, ordering and review of radiographic studies, pulse oximetry and re-evaluation of patient's condition.   FINAL CLINICAL IMPRESSION(S) / ED DIAGNOSES   Weakness Dyspnea Symptomatic anemia   Note:  This document was prepared using Dragon voice recognition software and may include unintentional dictation errors.   Harvest Dark, MD 05/01/22 1740

## 2022-05-01 NOTE — H&P (Addendum)
History and Physical   [name] DVV:[id number] DOB: 1946-06-29 DOA: 05/01/2022  PCP: System, Provider Not In Outpatient Specialists: Dr. Nelva Bush, cardiologist Patient coming from: Home  I have personally briefly reviewed patient's old medical records in [place].  Chief Concern: Pain and shortness of breath  HPI: Mr. Jonathan Cross is a 76 year old male with history of hyperlipidemia, hypertension, atrial fibrillation on Eliquis, GERD, insulin-dependent diabetes mellitus, CAD status post prior CABG, who presents emergency department for chief concerns of chest pain and shortness of breath.  Initial vitals in the emergency department showed temperature of 97.7, respiration rate of 22, heart rate of 79, blood pressure 115/59, SPO2 of 97% on room air.  Serum sodium is 136, potassium 3.2, chloride of 106, bicarb 21, BUN of 14, serum creatinine of 1.00, GFR greater than 60, nonfasting glucose 111, WBC 4.9, hemoglobin of 8, platelets of 168.  High sensitive troponin was 32 and on repeat is 32.  ED treatment: PRBC, 1 unit ordered for transfusion ---------------------------- At bedside, he is able to tell me his name, age, current year.  He reports that he has been having gradually worsening dyspnea with exertion and chest pressure.  He reports that chest pressure has been ongoing for the last 5 to 6 years.  He was seen by his PCP and cardiologist last week.  PCP states at some point he will need a transfusion. Cardiologist has scheduled him for left heart cath next Thursday.  He denies nausea, vomiting, abdominal pain, dysuria, hematuria, diarrhea  Social history: His daughter lives with him. He denies tobacco, etoh, recreational drug use. He currently works a Environmental education officer.   ROS: Constitutional: no weight change, no fever ENT/Mouth: no sore throat, no rhinorrhea Eyes: no eye pain, no vision changes Cardiovascular: no chest pain, no dyspnea,  no edema, no  palpitations Respiratory: no cough, no sputum, no wheezing Gastrointestinal: no nausea, no vomiting, no diarrhea, no constipation Genitourinary: no urinary incontinence, no dysuria, no hematuria Musculoskeletal: no arthralgias, no myalgias Skin: no skin lesions, no pruritus, Neuro: + weakness, no loss of consciousness, no syncope Psych: no anxiety, no depression, + decrease appetite Heme/Lymph: no bruising, no bleeding  ED Course: Discussed with emergency medicine provider, patient requiring hospitalization for chief concerns of symptomatic anemia.  Assessment/Plan  Principal Problem:   Symptomatic anemia Active Problems:   Benign prostatic hyperplasia   Essential hypertension   OSA (obstructive sleep apnea)   S/P aortic valve replacement   Hyperlipidemia LDL goal <70   GERD (gastroesophageal reflux disease)   Coronary artery disease   Elevated troponin   Paroxysmal A-fib (HCC)   Chest pressure   Assessment and Plan:  * Symptomatic anemia - Patient denies vomiting blood, coffee-ground emesis, melena, bright red blood per rectum - Baseline hemoglobin is 10-12 -Type and screen -1 unit of PRBC have been ordered for transfusion and is pending transfusion - Anemia panel ordered - Recheck CBC in the a.m. - Admit to telemetry medical, observation  Chest pressure - With stable high sensitive troponin - Presumed secondary to symptomatic anemia however given extensive history of CAD, cardiology team has been consulted via secure chat, Dr. Curt Bears - Complete echo ordered  Paroxysmal A-fib ([place]) - Holding home Eliquis at this time due to symptomatic anemia  Hyperlipidemia LDL goal <70 - Atorvastatin 80 mg nightly resumed  OSA (obstructive sleep apnea) - CPAP nightly ordered  Essential hypertension - Metoprolol succinate 25-50 mg daily, 25 mg in the morning, 50 mg at night -  Imdur 30  DVT prophylaxis-pharmacologic DVT prophylaxis not initiated on admission due to  symptomatic anemia - AM team to reinitiate pharmacologic DVT prophylaxis when the benefits outweigh the risk  Chart reviewed.   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 01/2016 CABG x 2: LIMA->LAD, VG->OM; b. 05/2021 MV: small, mild, rev apical lateral and apical inf defects ->subtle ischemia vs artifact, EF 55-65%-->low risk.   Diabetes ([place])    H/O aortic valve replacement    a. 01/2016 s/p bioprosthetic AVR @ [facility] for Severe AS; b. 05/2021 Echo: EF 55-60%, no rwma, mild LVH, nl RV fxn, nl fxn of bioprosthetic AVR.   History of kidney stones    Hx of CABG    Hyperlipidemia    Hypertension    PAF (paroxysmal atrial fibrillation) (HCC)    a. CHA2DS2VASc = 5-->eliquis/amio.   Sleep apnea    Past Surgical History:  Procedure Laterality Date   AORTIC VALVE REPLACEMENT  2017   [facility], bioprosthetic   APPENDECTOMY     CARDIAC VALVE REPLACEMENT     CORONARY ARTERY BYPASS GRAFT  2017   [facility] - LIMA-LAD and SVG-OM   CYSTOSCOPY W/ RETROGRADES Right 05/27/2019   Procedure: CYSTOSCOPY WITH RETROGRADE PYELOGRAM;  Surgeon: Billey Co, MD;  Location: [facility];  Service: Urology;  Laterality: Right;   CYSTOSCOPY W/ RETROGRADES Right 07/11/2019   Procedure: CYSTOSCOPY WITH RETROGRADE PYELOGRAM;  Surgeon: Billey Co, MD;  Location: [facility];  Service: Urology;  Laterality: Right;   CYSTOSCOPY/URETEROSCOPY/HOLMIUM LASER/STENT PLACEMENT Right 05/27/2019   Procedure: CYSTOSCOPY/URETEROSCOPY/HOLMIUM LASER/STENT PLACEMENT;  Surgeon: Billey Co, MD;  Location: [facility];  Service: Urology;  Laterality: Right;   CYSTOSCOPY/URETEROSCOPY/HOLMIUM  LASER/STENT PLACEMENT Right 06/17/2019   Procedure: CYSTOSCOPY/URETEROSCOPY/HOLMIUM LASER/STENT Exchange;  Surgeon: Billey Co, MD;  Location: [facility];  Service: Urology;  Laterality: Right;   CYSTOSCOPY/URETEROSCOPY/HOLMIUM LASER/STENT PLACEMENT Right 07/11/2019   Procedure: CYSTOSCOPY/URETEROSCOPY/HOLMIUM LASER/STENT Exchange;  Surgeon: Billey Co, MD;  Location: [facility];  Service: Urology;  Laterality: Right;   STONE EXTRACTION WITH BASKET Right 07/11/2019   Procedure: STONE EXTRACTION WITH BASKET;  Surgeon: Billey Co, MD;  Location: [facility];  Service: Urology;  Laterality: Right;   TEE WITHOUT CARDIOVERSION N/A 04/21/2019   Procedure: TRANSESOPHAGEAL ECHOCARDIOGRAM (TEE);  Surgeon: Minna Merritts, MD;  Location: [facility];  Service: Cardiovascular;  Laterality: N/A;   TEE WITHOUT CARDIOVERSION N/A 07/04/2019   Procedure: TRANSESOPHAGEAL ECHOCARDIOGRAM (TEE);  Surgeon: Wellington Hampshire, MD;  Location: [facility];  Service: Cardiovascular;  Laterality: N/A;   TEE WITHOUT CARDIOVERSION N/A 08/17/2019   Procedure: TRANSESOPHAGEAL ECHOCARDIOGRAM (TEE);  Surgeon: Kate Sable, MD;  Location: [facility];  Service: Cardiovascular;  Laterality: N/A;   TEE WITHOUT CARDIOVERSION N/A 11/15/2019   Procedure: TRANSESOPHAGEAL ECHOCARDIOGRAM (TEE);  Surgeon: Nelva Bush, MD;  Location: [facility];  Service: Cardiovascular;  Laterality: N/A;   TONSILLECTOMY     Social History:  reports that he has quit smoking. He has never used smokeless tobacco. He reports that he does not drink alcohol and does not use drugs.  Allergies  Allergen Reactions   Metformin Diarrhea    Loose stools even with XR   Family History  Problem Relation Age of  Onset   Heart attack Father 69   Heart attack Sister    Family history: Family history reviewed and not pertinent  Prior to Admission medications   Medication Sig Start Date End Date Taking? Authorizing Provider  amiodarone (PACERONE) 200 MG  tablet Take 1 tablet by mouth once daily 04/01/22   End, Harrell Gave, MD  aspirin EC 81 MG tablet Take 81 mg by mouth daily.    [provider]  atorvastatin (LIPITOR) 80 MG tablet Take 1 tablet (80 mg total) by mouth every evening. 11/01/21   Theora Gianotti, NP  ELIQUIS 5 MG TABS tablet Take 1 tablet (5 mg total) by mouth 2 (two) times daily. 07/10/21 07/05/22  End, Harrell Gave, MD  furosemide (LASIX) 40 MG tablet Take 1 tablet (40 mg total) by mouth daily. 10/25/21   Theora Gianotti, NP  insulin NPH-regular Human (70-30) 100 UNIT/ML injection Inject 120 Units into the skin 2 (two) times daily.    [provider]  isosorbide mononitrate (IMDUR) 30 MG 24 hr tablet Take 0.5 tablets (15 mg total) by mouth daily. 11/01/21   Theora Gianotti, NP  loratadine (CLARITIN) 10 MG tablet Take 10 mg by mouth daily. 09/13/18   [provider]  Magnesium Oxide 400 MG CAPS Take 1 capsule (400 mg total) by mouth daily. 11/01/21   Theora Gianotti, NP  meclizine (ANTIVERT) 25 MG tablet Take 1 tablet (25 mg total) by mouth 3 (three) times daily as needed for dizziness. 09/13/21   Shelly Coss, MD  metoprolol succinate (TOPROL-XL) 25 MG 24 hr tablet Take 1-2 tablets (25-50 mg total) by mouth See admin instructions. Take 1 tablet ('25mg'$ ) by mouth every morning and take 2 tablets ('50mg'$ ) by mouth every evening 11/01/21   Theora Gianotti, NP  nitroGLYCERIN (NITROSTAT) 0.4 MG SL tablet Place 0.4 mg under the tongue every 5 (five) minutes x 3 doses as needed for chest pain.  03/24/18   [provider]  omeprazole (PRILOSEC) 20 MG capsule Take 2 capsules (40 mg total) by mouth daily. 11/01/21   Theora Gianotti, NP  Potassium Citrate 15 MEQ (1620 MG) TBCR Take 2 tablets by mouth 2 (two) times daily. 07/20/19   Billey Co, MD  ranolazine (RANEXA) 500 MG 12 hr tablet Take 1 tablet (500 mg total) by mouth 2 (two) times daily. 04/03/22   Furth,  Cadence H, PA-C  tamsulosin (FLOMAX) 0.4 MG CAPS capsule Take 0.4 mg by mouth daily. 03/06/21   [provider]   Physical Exam: Vitals:   05/01/22 1655 05/01/22 1800 05/01/22 1900 05/01/22 1918  BP: (!) 158/73 (!) 149/66 (!) 151/70 129/73  Pulse: 65 64 64 65  Resp: '15 14 16 18  '$ Temp: 97.8 F (36.6 C)   98.1 F (36.7 C)  TempSrc: Oral   Oral  SpO2: 99% 97% 97%   Weight:      Height:       Constitutional: appears age-appropriate, frail, chronically ill, NAD, calm, comfortable Eyes: PERRL, lids and conjunctivae normal ENMT: Mucous membranes are moist. Posterior pharynx clear of any exudate or lesions. Age-appropriate dentition. Hearing appropriate Neck: normal, supple, no masses, no thyromegaly Respiratory: clear to auscultation bilaterally, no wheezing, no crackles. Normal respiratory effort. No accessory muscle use.  Cardiovascular: Regular rate and rhythm, no murmurs / rubs / gallops. No extremity edema. 2+ pedal pulses. No carotid bruits.  Abdomen: no tenderness, no masses palpated, no hepatosplenomegaly. Bowel sounds positive.  Musculoskeletal: no clubbing / cyanosis.  No joint deformity upper and lower extremities. Good ROM, no contractures, no atrophy. Normal muscle tone.  Skin: no rashes, lesions, ulcers. No induration Neurologic: Sensation intact. Strength 5/5 in all 4.  Psychiatric: Normal judgment and insight. Alert and oriented x 3. Normal mood.   EKG: independently reviewed, showing sinus rhythm with rate of 77, QTc 495  Chest x-ray on Admission: I personally reviewed and I agree with radiologist reading as below.  DG Chest 2 View  Result Date: 05/01/2022 CLINICAL DATA:  Left chest pain EXAM: CHEST - 2 VIEW COMPARISON:  06/02/2021 FINDINGS: Cardiac size is within normal limits. There are metallic sutures in the sternum. There are no signs of pulmonary edema or focal pulmonary consolidation. There is no pleural effusion or pneumothorax. IMPRESSION: There are no  signs of pulmonary edema or focal pulmonary consolidation. Electronically Signed   By: Elmer Picker M.D.   On: 05/01/2022 12:18    Labs on Admission: I have personally reviewed following labs  CBC: Recent Labs  Lab 05/01/22 1120  WBC 4.9  HGB 8.0*  HCT 25.0*  MCV 112.6*  PLT 650   Basic Metabolic Panel: Recent Labs  Lab 05/01/22 1120  NA 136  K 3.2*  CL 106  CO2 21*  GLUCOSE 111*  BUN 14  CREATININE 1.00  CALCIUM 7.9*   GFR: Estimated Creatinine Clearance: 75.6 mL/min (by C-G formula based on SCr of 1 mg/dL).  Coagulation Profile: Recent Labs  Lab 05/01/22 1120  INR 1.6*   CBG: Recent Labs  Lab 05/01/22 1125  GLUCAP 126*   Urine analysis:    Component Value Date/Time   COLORURINE YELLOW (A) 05/01/2022 1711   APPEARANCEUR CLOUDY (A) 05/01/2022 1711   APPEARANCEUR Cloudy (A) 06/07/2019 1409   LABSPEC 1.012 05/01/2022 1711   PHURINE 5.0 05/01/2022 1711   GLUCOSEU NEGATIVE 05/01/2022 1711   HGBUR SMALL (A) 05/01/2022 1711   BILIRUBINUR NEGATIVE 05/01/2022 1711   BILIRUBINUR Negative 06/07/2019 [place] 05/01/2022 1711   PROTEINUR 30 (A) 05/01/2022 1711   NITRITE NEGATIVE 05/01/2022 1711   LEUKOCYTESUR LARGE (A) 05/01/2022 1711   Dr. Tobie Poet [facility]  If 7PM-7AM, please contact overnight-coverage provider If 7AM-7PM, please contact day coverage provider [email or web address]  05/01/2022, 7:26 PM

## 2022-05-01 NOTE — ED Triage Notes (Signed)
Patient c/o left sided chest pressure that started today. No radiation

## 2022-05-01 NOTE — Assessment & Plan Note (Addendum)
-   Patient denies vomiting blood, coffee-ground emesis, melena, bright red blood per rectum - Baseline hemoglobin is 10-12 -Type and screen -1 unit of PRBC have been ordered for transfusion and is pending transfusion - Anemia panel ordered - Recheck CBC in the a.m. - Admit to telemetry medical, observation

## 2022-05-01 NOTE — Assessment & Plan Note (Signed)
-   With stable high sensitive troponin - Presumed secondary to symptomatic anemia however given extensive history of CAD, cardiology team has been consulted via secure chat, Dr. Curt Bears - Complete echo ordered

## 2022-05-01 NOTE — Assessment & Plan Note (Signed)
-   Holding home Eliquis at this time due to symptomatic anemia

## 2022-05-01 NOTE — Assessment & Plan Note (Signed)
-   Metoprolol succinate 25-50 mg daily, 25 mg in the morning, 50 mg at night - Imdur 30

## 2022-05-02 ENCOUNTER — Observation Stay (HOSPITAL_BASED_OUTPATIENT_CLINIC_OR_DEPARTMENT_OTHER)
Admit: 2022-05-02 | Discharge: 2022-05-02 | Disposition: A | Discharge status: Home / Self Care | Payer: Medicare HMO | Attending: Internal Medicine | Admitting: Internal Medicine

## 2022-05-02 ENCOUNTER — Encounter: Payer: Self-pay | Admitting: Internal Medicine

## 2022-05-02 DIAGNOSIS — I25118 Atherosclerotic heart disease of native coronary artery with other forms of angina pectoris: Secondary | ICD-10-CM

## 2022-05-02 DIAGNOSIS — D649 Anemia, unspecified: Secondary | ICD-10-CM | POA: Diagnosis not present

## 2022-05-02 DIAGNOSIS — I480 Paroxysmal atrial fibrillation: Secondary | ICD-10-CM | POA: Diagnosis not present

## 2022-05-02 DIAGNOSIS — D6489 Other specified anemias: Secondary | ICD-10-CM | POA: Diagnosis not present

## 2022-05-02 DIAGNOSIS — R0609 Other forms of dyspnea: Secondary | ICD-10-CM | POA: Diagnosis not present

## 2022-05-02 LAB — TYPE AND SCREEN
ABO/RH(D): B NEG
Antibody Screen: NEGATIVE
Unit division: 0

## 2022-05-02 LAB — CBC
HCT: 29.8 % — ABNORMAL LOW (ref 39.0–52.0)
Hemoglobin: 9.5 g/dL — ABNORMAL LOW (ref 13.0–17.0)
MCH: 34.3 pg — ABNORMAL HIGH (ref 26.0–34.0)
MCHC: 31.9 g/dL (ref 30.0–36.0)
MCV: 107.6 fL — ABNORMAL HIGH (ref 80.0–100.0)
Platelets: 157 10*3/uL (ref 150–400)
RBC: 2.77 MIL/uL — ABNORMAL LOW (ref 4.22–5.81)
RDW: 20.5 % — ABNORMAL HIGH (ref 11.5–15.5)
WBC: 5.2 10*3/uL (ref 4.0–10.5)
nRBC: 0 % (ref 0.0–0.2)

## 2022-05-02 LAB — GLUCOSE, CAPILLARY
Glucose-Capillary: 194 mg/dL — ABNORMAL HIGH (ref 70–99)
Glucose-Capillary: 240 mg/dL — ABNORMAL HIGH (ref 70–99)
Glucose-Capillary: 205 mg/dL — ABNORMAL HIGH (ref 70–99)

## 2022-05-02 LAB — ECHOCARDIOGRAM COMPLETE
AR max vel: 2.63 cm2
AV Area VTI: 2.85 cm2
AV Area mean vel: 2.66 cm2
AV Mean grad: 15 mmHg
AV Peak grad: 27.7 mmHg
Ao pk vel: 2.63 m/s
Area-P 1/2: 3.63 cm2
Height: 69 in
S' Lateral: 2.9 cm
Weight: 4000 oz

## 2022-05-02 LAB — BASIC METABOLIC PANEL
Anion gap: 6 (ref 5–15)
BUN: 12 mg/dL (ref 8–23)
CO2: 25 mmol/L (ref 22–32)
Calcium: 7.8 mg/dL — ABNORMAL LOW (ref 8.9–10.3)
Chloride: 106 mmol/L (ref 98–111)
Creatinine, Ser: 0.95 mg/dL (ref 0.61–1.24)
GFR, Estimated: >60 mL/min (ref >60)
Glucose, Bld: 198 mg/dL — ABNORMAL HIGH (ref 70–99)
Potassium: 3.6 mmol/L (ref 3.5–5.1)
Sodium: 137 mmol/L (ref 135–145)

## 2022-05-02 LAB — BPAM RBC
Blood Product Expiration Date: 202310312359
ISSUE DATE / TIME: 202310191911
Unit Type and Rh: 1700

## 2022-05-02 LAB — HEMOGLOBIN A1C
Hgb A1c MFr Bld: 8.3 % — ABNORMAL HIGH (ref 4.8–5.6)
Mean Plasma Glucose: 191.51 mg/dL

## 2022-05-02 LAB — VITAMIN B12: Vitamin B-12: 394 pg/mL (ref 180–914)

## 2022-05-02 MED ORDER — SODIUM CHLORIDE 0.9 % IV SOLN
1.0000 g | INTRAVENOUS | Status: DC
  Administered 2022-05-02: 1 g via INTRAVENOUS
  Filled 2022-05-02: qty 1

## 2022-05-02 MED ORDER — INSULIN GLARGINE-YFGN 100 UNIT/ML ~~LOC~~ SOLN
20.0000 [IU] | Freq: Every day | SUBCUTANEOUS | Status: DC
  Filled 2022-05-02: qty 0.2

## 2022-05-02 MED ORDER — SODIUM CHLORIDE 0.9 % IV SOLN: INTRAVENOUS | Status: DC | PRN

## 2022-05-02 MED ORDER — METOPROLOL SUCCINATE ER 25 MG PO TB24: 12.5000 mg | ORAL_TABLET | Freq: Every day | ORAL | 3 refills | Status: DC

## 2022-05-02 MED ORDER — CIPROFLOXACIN HCL 500 MG PO TABS: 500.0000 mg | ORAL_TABLET | Freq: Two times a day (BID) | ORAL | 0 refills | Status: AC

## 2022-05-02 NOTE — Discharge Summary (Signed)
Physician Discharge Summary   Patient: Jonathan Cross. MRN: [id number]  DOB: 1946-04-24   Admit:     Date of Admission: 05/01/2022 Admitted from: home   Discharge: Date of discharge: 05/02/22 Disposition: Home Condition at discharge: fair  CODE STATUS: FULL CODE     Discharge Physician: Emeterio Reeve, DO [facility]     PCP: System, Provider Not In  Recommendations for Outpatient Follow-up:  Follow up with PCP System, Provider Not In in 1 weeks Please obtain labs/tests: CBC, BMP, UA/Culture in 1 week, check blood pressure within one week and adjust Rx as appropriate  Please follow up on the following pending results: urine culture  PCP AND OTHER OUTPATIENT PROVIDERS: SEE [place] [facility] PATIENT INFO  Discharge Instructions     Diet - low sodium heart healthy   Complete by: As directed    Discharge instructions   Complete by: As directed    Please note medication changes below - we are HOLDING Eliquis and aspirin until or unless your outpatient providers restart these. This will lower risk of bleeding. We have also reduced your metoprolol dose.   Your chronic anemia needs to be followed up outpatient. Please reach out to the GI doctor on this paperwork. Please keep your appointment with hematology/oncology or try to move that appointment to an earlier date.   If you experience worsening weakness, lightheadedness, passing out or feeling like you might pass out, chest pains or difficulty breathing that happens at rest or does not improve after a brief rest - please seek emergency medical attention!  Please finish all antibiotics for urinary tract infection and follow up with your PCP in a week to recheck a urine test if your symptoms persist.   Increase activity slowly   Complete by: As directed          Discharge Diagnoses: Principal Problem:   Symptomatic anemia Active  Problems:   Benign prostatic hyperplasia   Essential hypertension   OSA (obstructive sleep apnea)   S/P aortic valve replacement   Hyperlipidemia LDL goal <70   GERD (gastroesophageal reflux disease)   Coronary artery disease   Elevated troponin   Paroxysmal A-fib ([place])   Chest pressure       Hospital Course: Jonathan Cross is a 76 year old male with history of hyperlipidemia, hypertension, atrial fibrillation on Eliquis, GERD, insulin-dependent diabetes mellitus, CAD status post prior CABG, who presents emergency department for chief concerns of chest pain and shortness of breath. He reports that he has been having gradually worsening dyspnea with exertion and chest pressure.  He reports that chest pressure has been ongoing for the last 5 to 6 years. He was seen by his PCP and cardiologist last week.  PCP states at some point he will need a transfusion. Cardiologist has scheduled him for left heart cath next Thursday. 10/19: Initial vitals in the emergency department showed temperature of 97.7, respiration rate of 22, heart rate of 79, blood pressure 115/59, SPO2 of 97% on room air. Sodium is 136, potassium 3.2, chloride of 106, bicarb 21, BUN of 14, serum creatinine of 1.00, GFR greater than 60, nonfasting glucose 111, WBC 4.9, hemoglobin of 8, platelets of 168.  High sensitive troponin was 32 and on repeat is 32. ED treatment: PRBC, 1 unit ordered for transfusion. Cardiology consulted  10/20: Abn UA, UCx added, he confirmed he is having symptoms of urinary frequency and occasional dysuria.  Cardiology consult: Echocardiogram normal LV function and wall motion, normal bioprosthetic aortic valve.  CAD with stable angina, no ACS.  Recommend maximizing antianginal therapy prior to considering invasive cardiac evaluation, restart Toprol and Imdur.  PT/OT evaluated, no follow-up needed.     Consultants:  Cardiology   Procedures: none      ASSESSMENT & PLAN:   Principal Problem:    Symptomatic anemia Active Problems:   Benign prostatic hyperplasia   Essential hypertension   OSA (obstructive sleep apnea)   S/P aortic valve replacement   Hyperlipidemia LDL goal <70   GERD (gastroesophageal reflux disease)   Coronary artery disease   Elevated troponin   Paroxysmal A-fib ([facility])   Chest pressure  Symptomatic anemia Macrocytosis Baseline hemoglobin is 10-12 Hgb on admission 8.0 --(1 unit PRBC)--> Hgb 9.5 iron, ferritin, B12, Folate WNL Retic Ct elevated Monitor CBC  Follow w/ hematology outpatient   Chest pressure With stable high sensitive troponin Presumed secondary to symptomatic anemia  Cardiology consult: Echocardiogram normal LV function and wall motion, normal bioprosthetic aortic valve.  CAD with stable angina, no ACS.  Recommend maximizing antianginal therapy prior to considering invasive cardiac evaluation, restart Toprol and Imdur. Cleared for any neccessary endoscopic procedures   Abnormal UA UTI present on admission UCx ordered - I am working next several days and will follow this result, patient is also given return precautions and follow up instructions   Paroxysmal A-fib ([place]) Holding home Eliquis at this time due to symptomatic anemia  Essential hypertension Metoprolol succinate 25-50 mg daily, 25 mg in the morning, 50 mg at night Imdur 30 held, started back on lower dose   Hyperlipidemia LDL goal <70 Atorvastatin 80 mg nightly resumed  OSA (obstructive sleep apnea) CPAP nightly ordered                  Discharge Instructions  Allergies as of 05/02/2022       Reactions   Metformin Diarrhea   Loose stools even with XR        Medication List     STOP taking these medications    aspirin EC 81 MG tablet   Eliquis 5 MG Tabs tablet Generic drug: apixaban   omeprazole 20 MG capsule Commonly known as: PRILOSEC       TAKE these medications    amiodarone 200 MG tablet Commonly known as: PACERONE Take 1  tablet by mouth once daily   atorvastatin 80 MG tablet Commonly known as: LIPITOR Take 1 tablet (80 mg total) by mouth every evening.   ciprofloxacin 500 MG tablet Commonly known as: CIPRO Take 1 tablet (500 mg total) by mouth 2 (two) times daily for 6 days. Start taking on: May 03, 2022   furosemide 40 MG tablet Commonly known as: LASIX Take 1 tablet (40 mg total) by mouth daily.   insulin NPH-regular Human (70-30) 100 UNIT/ML injection Inject 120 Units into the skin 2 (two) times daily.   isosorbide mononitrate 30 MG 24 hr tablet Commonly known as: IMDUR Take 0.5 tablets (15 mg total) by mouth daily.   loratadine 10 MG tablet Commonly known as: CLARITIN Take 10 mg by mouth daily.   Magnesium Oxide 400 MG Caps Take 1 capsule (400 mg total) by mouth daily.   meclizine 25 MG tablet Commonly known as: ANTIVERT Take 1 tablet (25 mg total) by mouth 3 (three) times daily as needed for dizziness.   metoprolol succinate 25 MG 24 hr tablet Commonly known as: TOPROL-XL Take 0.5  tablets (12.5 mg total) by mouth daily. What changed:  how much to take when to take this additional instructions   nitroGLYCERIN 0.4 MG SL tablet Commonly known as: NITROSTAT Place 0.4 mg under the tongue every 5 (five) minutes x 3 doses as needed for chest pain.   Potassium Citrate 15 MEQ (1620 MG) Tbcr Take 2 tablets by mouth 2 (two) times daily.   ranolazine 500 MG 12 hr tablet Commonly known as: Ranexa Take 1 tablet (500 mg total) by mouth 2 (two) times daily.   tamsulosin 0.4 MG Caps capsule Commonly known as: FLOMAX Take 0.4 mg by mouth daily.          Allergies  Allergen Reactions   Metformin Diarrhea    Loose stools even with XR     Subjective: pt feeling tired but no concerns about going home. Usual chest discomfort, no CP/SOB.    Discharge Exam: BP (!) 144/67 (BP Location: Left Arm)   Pulse 73   Temp (!) 97.4 F (36.3 C) (Oral)   Resp 16   Ht '5\' 9"'$  (1.753 m)    Wt 113.4 kg   SpO2 97%   BMI 36.92 kg/m  General: Pt is alert, awake, not in acute distress Cardiovascular: RRR, S1/S2 +, no rubs, no gallops Respiratory: CTA bilaterally, no wheezing, no rhonchi Abdominal: Soft, NT, ND, bowel sounds + Extremities: no edema, no cyanosis     The results of significant diagnostics from this hospitalization (including imaging, microbiology, ancillary and laboratory) are listed below for reference.     Microbiology: Recent Results (from the past 240 hour(s))  SARS Coronavirus 2 by RT PCR (hospital order, performed in [facility] hospital lab) *cepheid single result test* Anterior Nasal Swab     Status: None   Collection Time: 05/01/22  4:53 PM   Specimen: Anterior Nasal Swab  Result Value Ref Range Status   SARS Coronavirus 2 by RT PCR NEGATIVE NEGATIVE Final    Comment: (NOTE) SARS-CoV-2 target nucleic acids are NOT DETECTED.  The SARS-CoV-2 RNA is generally detectable in upper and lower respiratory specimens during the acute phase of infection. The lowest concentration of SARS-CoV-2 viral copies this assay can detect is 250 copies / mL. A negative result does not preclude SARS-CoV-2 infection and should not be used as the sole basis for treatment or other patient management decisions.  A negative result may occur with improper specimen collection / handling, submission of specimen other than nasopharyngeal swab, presence of viral mutation(s) within the areas targeted by this assay, and inadequate number of viral copies (<250 copies / mL). A negative result must be combined with clinical observations, patient history, and epidemiological information.  Fact Sheet for Patients:   https://www.patel.info/  Fact Sheet for Healthcare Providers: https://hall.com/  This test is not yet approved or  cleared by the [place] FDA and has been authorized for detection and/or diagnosis of SARS-CoV-2 by FDA  under an Emergency Use Authorization (EUA).  This EUA will remain in effect (meaning this test can be used) for the duration of the COVID-19 declaration under Section 564(b)(1) of the Act, 21 U.S.C. section 360bbb-3(b)(1), unless the authorization is terminated or revoked sooner.  Performed at [facility], [address] [place], [place] [address]      Labs: BNP (last 3 results) Recent Labs    06/02/21 0927 11/27/21 1541  BNP 88.9 21.2   Basic Metabolic Panel: Recent Labs  Lab 05/01/22 1120 05/02/22 0405  NA 136 137  K 3.2* 3.6  CL 106 106  CO2 21* 25  GLUCOSE 111* 198*  BUN 14 12  CREATININE 1.00 0.95  CALCIUM 7.9* 7.8*   Liver Function Tests: No results for input(s): "AST", "ALT", "ALKPHOS", "BILITOT", "PROT", "ALBUMIN" in the last 168 hours. No results for input(s): "LIPASE", "AMYLASE" in the last 168 hours. No results for input(s): "AMMONIA" in the last 168 hours. CBC: Recent Labs  Lab 05/01/22 1120 05/02/22 0405  WBC 4.9 5.2  HGB 8.0* 9.5*  HCT 25.0* 29.8*  MCV 112.6* 107.6*  PLT 168 157   Cardiac Enzymes: No results for input(s): "CKTOTAL", "CKMB", "CKMBINDEX", "TROPONINI" in the last 168 hours. BNP: Invalid input(s): "POCBNP" CBG: Recent Labs  Lab 05/01/22 1125 05/01/22 2306 05/02/22 0823 05/02/22 1135 05/02/22 1641  GLUCAP 126* 101* 194* 240* 205*   D-Dimer No results for input(s): "DDIMER" in the last 72 hours. Hgb A1c Recent Labs    05/02/22 0405  HGBA1C 8.3*   Lipid Profile No results for input(s): "CHOL", "HDL", "LDLCALC", "TRIG", "CHOLHDL", "LDLDIRECT" in the last 72 hours. Thyroid function studies No results for input(s): "TSH", "T4TOTAL", "T3FREE", "THYROIDAB" in the last 72 hours.  Invalid input(s): "FREET3" Anemia work up Recent Labs    05/01/22 1120 05/01/22 1128 05/01/22 1653  VITAMINB12  --  394  --   FOLATE 10.0  --   --   FERRITIN 366*  --   --   TIBC 263  --   --   IRON 95  --   --   RETICCTPCT   --   --  4.7*   Urinalysis    Component Value Date/Time   COLORURINE YELLOW (A) 05/01/2022 1711   APPEARANCEUR CLOUDY (A) 05/01/2022 1711   APPEARANCEUR Cloudy (A) 06/07/2019 1409   LABSPEC 1.012 05/01/2022 1711   PHURINE 5.0 05/01/2022 1711   GLUCOSEU NEGATIVE 05/01/2022 1711   HGBUR SMALL (A) 05/01/2022 1711   BILIRUBINUR NEGATIVE 05/01/2022 1711   BILIRUBINUR Negative 06/07/2019 1409   KETONESUR NEGATIVE 05/01/2022 1711   PROTEINUR 30 (A) 05/01/2022 1711   NITRITE NEGATIVE 05/01/2022 1711   LEUKOCYTESUR LARGE (A) 05/01/2022 1711   Sepsis Labs Recent Labs  Lab 05/01/22 1120 05/02/22 0405  WBC 4.9 5.2   Microbiology Recent Results (from the past 240 hour(s))  SARS Coronavirus 2 by RT PCR (hospital order, performed in [place] hospital lab) *cepheid single result test* Anterior Nasal Swab     Status: None   Collection Time: 05/01/22  4:53 PM   Specimen: Anterior Nasal Swab  Result Value Ref Range Status   SARS Coronavirus 2 by RT PCR NEGATIVE NEGATIVE Final    Comment: (NOTE) SARS-CoV-2 target nucleic acids are NOT DETECTED.  The SARS-CoV-2 RNA is generally detectable in upper and lower respiratory specimens during the acute phase of infection. The lowest concentration of SARS-CoV-2 viral copies this assay can detect is 250 copies / mL. A negative result does not preclude SARS-CoV-2 infection and should not be used as the sole basis for treatment or other patient management decisions.  A negative result may occur with improper specimen collection / handling, submission of specimen other than nasopharyngeal swab, presence of viral mutation(s) within the areas targeted by this assay, and inadequate number of viral copies (<250 copies / mL). A negative result must be combined with clinical observations, patient history, and epidemiological information.  Fact Sheet for Patients:   https://www.patel.info/  Fact Sheet for Healthcare  Providers: https://hall.com/  This test is not yet approved or  cleared by the [place] and has been authorized for detection and/or diagnosis of SARS-CoV-2 by FDA under an Emergency Use Authorization (EUA).  This EUA will remain in effect (meaning this test can be used) for the duration of the COVID-19 declaration under Section 564(b)(1) of the Act, 21 U.S.C. section 360bbb-3(b)(1), unless the authorization is terminated or revoked sooner.  Performed at [facility], [address], [place], [place] [address]    Imaging ECHOCARDIOGRAM COMPLETE  Result Date: 05/02/2022    ECHOCARDIOGRAM REPORT   Patient Name:   Kyal Arts [facility]. Date of Exam: 05/02/2022 Medical Rec #:  [id number]              Height:       69.0 in Accession #:    [id number]             Weight:       250.0 lb Date of Birth:  May 09, 1946              BSA:          2.272 m Patient Age:    7 years               BP:           143/73 mmHg Patient Gender: M                      HR:           71 bpm. Exam Location:  [facility] Procedure: 2D Echo, Color Doppler and Cardiac Doppler Indications:     R06.00 Dyspnea  History:         Patient has prior history of Echocardiogram examinations, most                  recent 06/03/2021. CAD, Prior CABG, 2017 AVR bovine 23 mm; Risk                  Factors:Hypertension, Dyslipidemia and Sleep Apnea.  Sonographer:     Charmayne Sheer Referring Phys:  [id number] AMY N COX Diagnosing Phys: Ida Rogue MD IMPRESSIONS  1. Left ventricular ejection fraction, by estimation, is 60 to 65%. The left ventricle has normal function. The left ventricle has no regional wall motion abnormalities. Left ventricular diastolic parameters are consistent with Grade II diastolic dysfunction (pseudonormalization).  2. Right ventricular systolic function is normal. The right ventricular size is normal. Tricuspid regurgitation signal is inadequate for assessing PA pressure.  3. Left atrial  size was mildly dilated.  4. The mitral valve is normal in structure. Mild mitral valve regurgitation. No evidence of mitral stenosis.  5. The aortic valve has been repaired/replaced. Aortic valve regurgitation is not visualized. No aortic stenosis is present. Aortic valve area, by VTI measures 2.85 cm. Aortic valve mean gradient measures 15.0 mmHg. s/p AVR bovine in 2017  6. The inferior vena cava is normal in size with greater than 50% respiratory variability, suggesting right atrial pressure of 3 mmHg. FINDINGS  Left Ventricle: Left ventricular ejection fraction, by estimation, is 60 to 65%. The left ventricle has normal function. The left ventricle has no regional wall motion abnormalities. The left ventricular internal cavity size was normal in size. There is  no left ventricular hypertrophy. Left ventricular diastolic parameters are consistent with Grade II diastolic dysfunction (pseudonormalization). Right Ventricle: The right ventricular size is normal. No increase in right ventricular wall thickness. Right ventricular systolic function is normal. Tricuspid regurgitation signal  is inadequate for assessing PA pressure. Left Atrium: Left atrial size was mildly dilated. Right Atrium: Right atrial size was normal in size. Pericardium: There is no evidence of pericardial effusion. Mitral Valve: The mitral valve is normal in structure. Mild mitral valve regurgitation. No evidence of mitral valve stenosis. Tricuspid Valve: The tricuspid valve is normal in structure. Tricuspid valve regurgitation is not demonstrated. No evidence of tricuspid stenosis. Aortic Valve: The aortic valve has been repaired/replaced. There is mild calcification of the aortic valve. Aortic valve regurgitation is not visualized. Mild aortic stenosis is present. Aortic valve mean gradient measures 15.0 mmHg. Aortic valve peak gradient measures 27.7 mmHg. Aortic valve area, by VTI measures 2.85 cm. Pulmonic Valve: The pulmonic valve was  normal in structure. Pulmonic valve regurgitation is not visualized. No evidence of pulmonic stenosis. Aorta: The aortic root is normal in size and structure. Venous: The inferior vena cava is normal in size with greater than 50% respiratory variability, suggesting right atrial pressure of 3 mmHg. IAS/Shunts: No atrial level shunt detected by color flow Doppler.  LEFT VENTRICLE PLAX 2D LVIDd:         4.70 cm   Diastology LVIDs:         2.90 cm   LV e' medial:    6.31 cm/s LV PW:         1.20 cm   LV E/e' medial:  22.7 LV IVS:        1.00 cm   LV e' lateral:   9.46 cm/s LVOT diam:     2.20 cm   LV E/e' lateral: 15.1 LV SV:         153 LV SV Index:   67 LVOT Area:     3.80 cm  RIGHT VENTRICLE RV Basal diam:  4.30 cm RV S prime:     8.92 cm/s TAPSE (M-mode): 2.3 cm LEFT ATRIUM             Index        RIGHT ATRIUM           Index LA diam:        4.10 cm 1.80 cm/m   RA Area:     15.00 cm LA Vol (A2C):   49.4 ml 21.75 ml/m  RA Volume:   39.20 ml  17.26 ml/m LA Vol (A4C):   48.0 ml 21.13 ml/m LA Biplane Vol: 48.8 ml 21.48 ml/m  AORTIC VALVE                     PULMONIC VALVE AV Area (Vmax):    2.63 cm      PV Vmax:       1.00 m/s AV Area (Vmean):   2.66 cm      PV Vmean:      68.800 cm/s AV Area (VTI):     2.85 cm      PV VTI:        0.219 m AV Vmax:           263.00 cm/s   PV Peak grad:  4.0 mmHg AV Vmean:          177.000 cm/s  PV Mean grad:  2.0 mmHg AV VTI:            0.538 m AV Peak Grad:      27.7 mmHg AV Mean Grad:      15.0 mmHg LVOT Vmax:         182.00 cm/s LVOT Vmean:  124.000 cm/s LVOT VTI:          0.403 m LVOT/AV VTI ratio: 0.75  AORTA Ao Root diam: 3.00 cm MITRAL VALVE MV Area (PHT): 3.63 cm     SHUNTS MV Decel Time: 209 msec     Systemic VTI:  0.40 m MV E velocity: 143.00 cm/s  Systemic Diam: 2.20 cm MV A velocity: 112.00 cm/s MV E/A ratio:  1.28 Ida Rogue MD Electronically signed by Ida Rogue MD Signature Date/Time: 05/02/2022/1:16:45 PM    Final       Time coordinating  discharge: over 30 minutes  SIGNED:  Emeterio Reeve DO [facility]

## 2022-05-02 NOTE — Plan of Care (Signed)

## 2022-05-02 NOTE — TOC Initial Note (Signed)
Transition of Care (TOC) - Initial/Assessment Note    Patient Details  Name: Jonathan Cross. MRN: [id number] Date of Birth: 1946/06/26  Transition of Care [facility]) CM/SW Contact:    Laurena Slimmer, RN Phone Number: 05/02/2022, 11:16 AM  Clinical Narrative:                   Transition of Care [facility]) Screening Note   Patient Details  Name: Jonathan Cross. Date of Birth: Dec 02, 1945   Transition of Care [facility]) CM/SW Contact:    Laurena Slimmer, RN Phone Number: 05/02/2022, 11:17 AM    Transition of Care Department [facility]) has reviewed patient and no TOC needs have been identified at this time. We will continue to monitor patient advancement through interdisciplinary progression rounds. If new patient transition needs arise, please place a TOC consult.         Patient Goals and CMS Choice        Expected Discharge Plan and Services                                                Prior Living Arrangements/Services                       Activities of Daily Living Home Assistive Devices/Equipment: Dentures (specify type), Eyeglasses ADL Screening (condition at time of admission) Patient's cognitive ability adequate to safely complete daily activities?: Yes Is the patient deaf or have difficulty hearing?: No Does the patient have difficulty seeing, even when wearing glasses/contacts?: No Does the patient have difficulty concentrating, remembering, or making decisions?: No Patient able to express need for assistance with ADLs?: Yes Does the patient have difficulty dressing or bathing?: No Independently performs ADLs?: Yes (appropriate for developmental age) Does the patient have difficulty walking or climbing stairs?: Yes Weakness of Legs: Both Weakness of Arms/Hands: None  Permission Sought/Granted                  Emotional Assessment              Admission diagnosis:  Weakness [R53.1] Symptomatic anemia  [D64.9] Dyspnea, unspecified type [R06.00] Chest pain, unspecified type [R07.9] Patient Active Problem List   Diagnosis Date Noted   Symptomatic anemia 05/01/2022   Chest pressure 05/01/2022   History of endocarditis 11/13/2021   Chronic anemia 11/13/2021   Acute on chronic HFrEF (heart failure with reduced ejection fraction) ([place]) 11/13/2021   Paroxysmal A-fib ([place]) 09/12/2021   Thrombocytopenia ([place]) 09/12/2021   Vertigo, acute CVA ruled out via MRI, likely BPPV but assoc w/ ambulatory dysfunction and not responsive to conservative tx 09/11/2021   Acute on chronic heart failure with preserved ejection fraction ([place]) 06/14/2021   Atrial fibrillation with RVR ([place]) 06/02/2021   Severe sepsis ([place]) 06/02/2021   Infection of scalp 06/02/2021   Elevated troponin 06/02/2021   Pancytopenia ([place]) 06/02/2021   Cellulitis of scalp 06/02/2021   Tinea corporis    Swelling of toe of left foot 11/02/2019   Aortic valve endocarditis    Coronary artery disease    Hypoalbuminemia 06/29/2019   Hyperglycemia 06/29/2019   Lactic acidosis 06/29/2019   GERD (gastroesophageal reflux disease) 06/29/2019   Staghorn renal calculus 06/29/2019   Nonrheumatic aortic valve stenosis 05/13/2019   Hyperlipidemia LDL goal <70 05/13/2019   Preop cardiovascular exam 05/13/2019  Bacteremia due to Enterococcus 04/18/2019   Class 2 severe obesity due to excess calories with serious comorbidity and body mass index (BMI) of 38.0 to 38.9 in adult [facility]) 03/24/2018   S/P aortic valve replacement 08/07/2016   Status post coronary artery bypass graft 08/07/2016   Coronary artery disease of native artery of native heart with stable angina pectoris ([place]) 02/11/2016   SVT (supraventricular tachycardia) 07/31/2015   Type 2 diabetes mellitus without complication, with long-term current use of insulin ([place]) 03/28/2015   Benign prostatic hyperplasia 04/07/2014   OSA (obstructive sleep apnea) 04/07/2014   Essential  hypertension 05/16/2009   PCP:  System, Provider Not In Pharmacy:   [facility] [address] [place] [address] Phone: [phone] Fax: [place], [place] - [place] AT [facility] [address] [place] [address] Phone: [phone] Fax: [phone]     Social Determinants of Health (SDOH) Interventions    Readmission Risk Interventions    11/17/2019    2:19 PM 08/16/2019    1:32 PM  Readmission Risk Prevention Plan  Transportation Screening Complete Complete  PCP or Specialist Appt within 3-5 Days  Complete  HRI or [place] Complete   Social Work Consult for [place] Planning/Counseling Complete   Palliative Care Screening Not Applicable Not Applicable  Medication Review [employer] photographer) Complete Complete

## 2022-05-02 NOTE — Evaluation (Signed)
Occupational Therapy Evaluation Patient Details Name: Jonathan Cross. MRN: [id number] DOB: 1945/11/27 Today's Date: 05/02/2022   History of Present Illness 76 year old male with history of hyperlipidemia, hypertension, atrial fibrillation on Eliquis, GERD, insulin-dependent diabetes mellitus, CAD status post prior CABG, who presents emergency department for chief concerns of chest pain and shortness of breath.   Clinical Impression   Patient presenting with decreased Ind in self care, balance, functional mobility/transfers, endurance, and safety awareness. Patient reports living at home with daughter. He does not use AD at baseline but reports being unsteady and furniture walking. Pt and daughter share IADL tasks. He has been limited for several months now secondary to fatigue and chest pain.Patient currently functioning at min guard without use of AD but is supervision level with RW. OT spoke to pt about using RW for all mobility for safety awareness and energy conservation. Patient will benefit from acute OT to increase overall independence in the areas of ADLs, functional mobility, and safety awareness in order to safely discharge home with family.     Recommendations for follow up therapy are one component of a multi-disciplinary discharge planning process, led by the attending physician.  Recommendations may be updated based on patient status, additional functional criteria and insurance authorization.   Follow Up Recommendations  No OT follow up    Assistance Recommended at Discharge Intermittent Supervision/Assistance  Patient can return home with the following A little help with walking and/or transfers;A lot of help with bathing/dressing/bathroom;Assist for transportation;Help with stairs or ramp for entrance    Functional Status Assessment  Patient has had a recent decline in their functional status and demonstrates the ability to make significant improvements in function in a  reasonable and predictable amount of time.  Equipment Recommendations  None recommended by OT       Precautions / Restrictions Precautions Precautions: Fall      Mobility Bed Mobility Overal bed mobility: Modified Independent             General bed mobility comments: no physical assistance but needs increased time to complete tasks    Transfers Overall transfer level: Needs assistance Equipment used: 1 person hand held assist, Rolling walker (2 wheels) Transfers: Sit to/from Stand Sit to Stand: Supervision           General transfer comment: supervision with RW and min guard without use of AD      Balance Overall balance assessment: Needs assistance Sitting-balance support: Feet supported Sitting balance-Leahy Scale: Good     Standing balance support: Reliant on assistive device for balance, During functional activity, Bilateral upper extremity supported Standing balance-Leahy Scale: Fair                             ADL either performed or assessed with clinical judgement   ADL Overall ADL's : Needs assistance/impaired                         Toilet Transfer: Economist and Hygiene: Min guard;Sit to/from stand       Functional mobility during ADLs: Min guard;Supervision/safety;Rolling walker (2 wheels) General ADL Comments: Pt requires min guard for mobility without use of AD but supervision with RW. Figure four position to don socks without assistance.     Vision Patient Visual Report: No change from baseline  Pertinent Vitals/Pain Pain Assessment Pain Score: 5  Pain Location: chest pain Pain Descriptors / Indicators: Aching, Discomfort Pain Intervention(s): Limited activity within patient's tolerance, Monitored during session, Repositioned     Hand Dominance Right   Extremity/Trunk Assessment Upper Extremity Assessment Upper Extremity Assessment:  Generalized weakness;Overall [facility] for tasks assessed   Lower Extremity Assessment Lower Extremity Assessment: Generalized weakness;Overall WFL for tasks assessed       Communication Communication Communication: No difficulties   Cognition Arousal/Alertness: Awake/alert Behavior During Therapy: WFL for tasks assessed/performed Overall Cognitive Status: Within Functional Limits for tasks assessed                                                  Home Living Family/patient expects to be discharged to:: Private residence Living Arrangements: Children Available Help at Discharge: Family;Available PRN/intermittently Type of Home: House Home Access: Stairs to enter [employer] of Steps: 3 Entrance Stairs-Rails: Right;Left;Can reach both Home Layout: One level     Bathroom Shower/Tub: Occupational psychologist: Standard Bathroom Accessibility: No   Home Equipment: Grab bars - tub/shower;Rolling Walker (2 wheels);Shower seat;Cane - single point          Prior Functioning/Environment Prior Level of Function : Independent/Modified Independent;Working/employed             Mobility Comments: Walks without AD and endorses furniture walking. No falls this year but reports being unsteady. ADLs Comments: IND in ADL. Lives with daughter, who handles shopping, cooking, cleaning        OT Problem List: Decreased strength;Decreased activity tolerance;Impaired balance (sitting and/or standing);Decreased knowledge of use of DME or AE;Decreased safety awareness      OT Treatment/Interventions: Self-care/ADL training;Therapeutic exercise;Therapeutic activities;Energy conservation;Manual therapy;Balance training;Patient/family education    OT Goals(Current goals can be found in the care plan section) Acute Rehab OT Goals Patient Stated Goal: to feel better and stronger OT Goal Formulation: With patient Time For Goal Achievement: 05/16/22 Potential  to Achieve Goals: Good ADL Goals Pt Will Perform Grooming: with modified independence;standing Pt Will Perform Lower Body Dressing: with modified independence Pt Will Transfer to Toilet: with modified independence Pt Will Perform Toileting - Clothing Manipulation and hygiene: with modified independence  OT Frequency: Min 2X/week       AM-PAC OT "6 Clicks" Daily Activity     Outcome Measure Help from another person eating meals?: None Help from another person taking care of personal grooming?: None Help from another person toileting, which includes using toliet, bedpan, or urinal?: A Little Help from another person bathing (including washing, rinsing, drying)?: A Little Help from another person to put on and taking off regular upper body clothing?: None Help from another person to put on and taking off regular lower body clothing?: A Little 6 Click Score: 21   End of Session Equipment Utilized During Treatment: Rolling walker (2 wheels) Nurse Communication: Mobility status;Other (comment) (Pt seated on EOB finishing breakfast)  Activity Tolerance: Patient limited by fatigue Patient left: in bed;with call bell/phone within reach;with bed alarm set  OT Visit Diagnosis: Unsteadiness on feet (R26.81);Repeated falls (R29.6);Muscle weakness (generalized) (M62.81)                Time: 8657-8469 OT Time Calculation (min): 12 min Charges:  OT General Charges $OT Visit: 1 Visit OT Evaluation $OT Eval Low Complexity: 1 Low  Casey Maxfield  Brittain Smithey, [place], OTR/L , CBIS ascom [phone]  05/02/22, 10:32 AM

## 2022-05-02 NOTE — Consult Note (Signed)
Cardiology Consultation   Patient ID: Jonathan Cross [facility]. MRN: [id number]; DOB: Sep 05, 1945  Admit date: 05/01/2022 Date of Consult: 05/02/2022  PCP:  System, Provider Not In   [facility] Cardiologist:  Nelva Bush, MD        Patient Profile:   Jonathan Cross. is a 76 y.o. 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  History of Present Illness:   Jonathan Cross is a 76 year old male with a past medical history including CAD status post CABG x2 (LIMA to the LAD, SVG to the obtuse marginal), severe aortic stenosis status post bioprosthetic aortic valve replacement, bacterial endocarditis, paroxysmal atrial fibrillation, hypertension, hyperlipidemia, type 2 diabetes, sleep apnea, and BPH.  He was previously followed at [facility] in the setting of severe aortic stenosis, underwent bioprosthetic aortic valve replacement and two-vessel coronary artery bypass grafting in July 2017.  Between October 2020 and May 2021 he had multiple admissions related to fever, malaise, and Enterococcus bacteremia.  Transesophageal echocardiograms in October 20 03 July 2019 were negative for vegetation.  In February 2021 he was found to have a mobile mass on his prosthetic aortic valve and was seen by infectious disease and treated with appropriate antibiotics for 6 weeks in the outpatient setting.  Repeat TEE in May 2021 continued to show an echodensity that appeared less mobile but larger, measuring 1.1 x 0.6 cm.  Perivalvular abscess cannot be excluded.  He was transferred to [facility] where cardiac CT showed evidence of a small mass in the leaflet of the noncoronary cusp, likely representing a healed vegetation.  Leaflets were freely movable without  evidence of obstruction.  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  The LIMA to the LAD and vein graft to the obtuse marginal were patent.  He was seen by CT surgery who recommend continued observation and antibiotic therapy.  In November 2022 he was admitted with dyspnea and chest fullness and found to be in rapid atrial fibrillation, which converted to sinus rhythm on intravenous amiodarone.  He underwent stress testing in the setting of chest pain which showed small, mild, reversible apical lateral and apical inferior defect with a question of septal ischemia versus infarct.  Ultimately it was a low risk study and he was medically managed.  In April 2023 he was evaluated for progressive dyspnea and a 30 pound weight gain.  Lasix had been added to his daily medication regimen.  In follow-up November 01, 2021 he was very weak and referred to the emergency department where work-up was unremarkable and he was subsequently discharged home.  He returned to the [facility] emergency department on 05/01/2022 with a chief complaint of chest pain.  He stated for the past 24 hours since that had been experienced chest pressure mostly in the center of his chest along with associated shortness of breath and weakness worsened with exertion.  He denied any other associated symptoms of nausea, vomiting or syncope/near syncope.  And that he had felt weak and had fatigue for approximately the past 3 weeks.  He did go to work on Monday and Tuesday of this week but ended up having to leave due to discomfort and associated shortness  of breath and fatigue.  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 01/2016 CABG x 2: LIMA->LAD, VG->OM; b. 05/2021 MV: small, mild, rev apical lateral and apical inf defects ->subtle ischemia vs artifact, EF 55-65%-->low risk.   Diabetes ([place])    H/O aortic valve replacement    a. 01/2016 s/p bioprosthetic AVR @ [facility] for Severe AS; b. 05/2021 Echo: EF 55-60%, no rwma, mild LVH, nl RV fxn, nl fxn of bioprosthetic AVR.   History of kidney stones    Hx of CABG    Hyperlipidemia    Hypertension    PAF (paroxysmal atrial fibrillation) (HCC)    a. CHA2DS2VASc = 5-->eliquis/amio.   Sleep apnea     Past Surgical History:  Procedure Laterality Date   AORTIC VALVE REPLACEMENT  2017   [facility], bioprosthetic   APPENDECTOMY     CARDIAC VALVE REPLACEMENT     CORONARY ARTERY BYPASS GRAFT  2017   [facility] - LIMA-LAD and SVG-OM   CYSTOSCOPY W/ RETROGRADES Right 05/27/2019   Procedure: CYSTOSCOPY WITH RETROGRADE PYELOGRAM;  Surgeon: Billey Co, MD;  Location: [facility] OR[facility];  Service: Urology;  Laterality: Right;   CYSTOSCOPY W/ RETROGRADES Right 07/11/2019   Procedure: CYSTOSCOPY WITH RETROGRADE PYELOGRAM;  Surgeon: Billey Co, MD;  Location: [facility];  Service: Urology;  Laterality: Right;   CYSTOSCOPY/URETEROSCOPY/HOLMIUM LASER/STENT PLACEMENT Right 05/27/2019   Procedure: CYSTOSCOPY/URETEROSCOPY/HOLMIUM LASER/STENT PLACEMENT;  Surgeon: Billey Co, MD;  Location: [facility];  Service: Urology;  Laterality: Right;   CYSTOSCOPY/URETEROSCOPY/HOLMIUM LASER/STENT PLACEMENT Right 06/17/2019   Procedure: CYSTOSCOPY/URETEROSCOPY/HOLMIUM LASER/STENT Exchange;  Surgeon: Billey Co, MD;  Location: [facility];  Service: Urology;  Laterality: Right;   CYSTOSCOPY/URETEROSCOPY/HOLMIUM LASER/STENT  PLACEMENT Right 07/11/2019   Procedure: CYSTOSCOPY/URETEROSCOPY/HOLMIUM LASER/STENT Exchange;  Surgeon: Billey Co, MD;  Location: [facility];  Service: Urology;  Laterality: Right;   STONE EXTRACTION WITH BASKET Right 07/11/2019   Procedure: STONE EXTRACTION WITH BASKET;  Surgeon: Billey Co, MD;  Location: [facility];  Service: Urology;  Laterality: Right;   TEE WITHOUT CARDIOVERSION N/A 04/21/2019   Procedure: TRANSESOPHAGEAL ECHOCARDIOGRAM (TEE);  Surgeon: Minna Merritts, MD;  Location: [facility];  Service: Cardiovascular;  Laterality: N/A;   TEE WITHOUT CARDIOVERSION N/A 07/04/2019   Procedure: TRANSESOPHAGEAL ECHOCARDIOGRAM (TEE);  Surgeon: Wellington Hampshire, MD;  Location: [facility];  Service: Cardiovascular;  Laterality: N/A;   TEE WITHOUT CARDIOVERSION N/A 08/17/2019   Procedure: TRANSESOPHAGEAL ECHOCARDIOGRAM (TEE);  Surgeon: Kate Sable, MD;  Location: [facility];  Service: Cardiovascular;  Laterality: N/A;   TEE WITHOUT CARDIOVERSION N/A 11/15/2019   Procedure: TRANSESOPHAGEAL ECHOCARDIOGRAM (TEE);  Surgeon: Nelva Bush, MD;  Location: [facility];  Service: Cardiovascular;  Laterality: N/A;   TONSILLECTOMY       Home Medications:  Prior to Admission medications   Medication Sig Start Date End Date Taking? Authorizing Provider  amiodarone (PACERONE) 200 MG tablet Take 1 tablet by mouth once daily 04/01/22   End, Harrell Gave, MD  aspirin  EC 81 MG tablet Take 81 mg by mouth daily.    [provider]  atorvastatin (LIPITOR) 80 MG tablet Take 1 tablet (80 mg total) by mouth every evening. 11/01/21   Theora Gianotti, NP  ELIQUIS 5 MG TABS tablet Take 1 tablet (5 mg total) by mouth 2 (two) times daily. 07/10/21 07/05/22  End, Harrell Gave, MD  furosemide (LASIX) 40 MG tablet Take 1 tablet (40 mg total) by mouth daily. 10/25/21   Theora Gianotti, NP  insulin NPH-regular Human (70-30) 100 UNIT/ML injection Inject 120 Units into the skin 2 (two) times  daily.    [provider]  isosorbide mononitrate (IMDUR) 30 MG 24 hr tablet Take 0.5 tablets (15 mg total) by mouth daily. 11/01/21   Theora Gianotti, NP  loratadine (CLARITIN) 10 MG tablet Take 10 mg by mouth daily. 09/13/18   [provider]  Magnesium Oxide 400 MG CAPS Take 1 capsule (400 mg total) by mouth daily. 11/01/21   Theora Gianotti, NP  meclizine (ANTIVERT) 25 MG tablet Take 1 tablet (25 mg total) by mouth 3 (three) times daily as needed for dizziness. 09/13/21   Shelly Coss, MD  metoprolol succinate (TOPROL-XL) 25 MG 24 hr tablet Take 1-2 tablets (25-50 mg total) by mouth See admin instructions. Take 1 tablet ('25mg'$ ) by mouth every morning and take 2 tablets ('50mg'$ ) by mouth every evening 11/01/21   Theora Gianotti, NP  nitroGLYCERIN (NITROSTAT) 0.4 MG SL tablet Place 0.4 mg under the tongue every 5 (five) minutes x 3 doses as needed for chest pain.  03/24/18   [provider]  omeprazole (PRILOSEC) 20 MG capsule Take 2 capsules (40 mg total) by mouth daily. 11/01/21   Theora Gianotti, NP  Potassium Citrate 15 MEQ (1620 MG) TBCR Take 2 tablets by mouth 2 (two) times daily. 07/20/19   Billey Co, MD  ranolazine (RANEXA) 500 MG 12 hr tablet Take 1 tablet (500 mg total) by mouth 2 (two) times daily. 04/03/22   Furth, Cadence H, PA-C  tamsulosin (FLOMAX) 0.4 MG CAPS capsule Take 0.4 mg by mouth daily. 03/06/21   [provider]    Inpatient Medications: Scheduled Meds:  atorvastatin  80 mg Oral QPM   insulin aspart  0-20 Units Subcutaneous TID WC   insulin aspart  0-5 Units Subcutaneous QHS   ranolazine  500 mg Oral BID   Continuous Infusions:  PRN Meds: acetaminophen **OR** acetaminophen, labetalol, meclizine, nitroGLYCERIN, ondansetron **OR** ondansetron (ZOFRAN) IV, senna-docusate  Allergies:    Allergies  Allergen Reactions   Metformin Diarrhea    Loose stools even with XR    Social History:   Social  History   Socioeconomic History   Marital status: Married    Spouse name: Not on file   Number of children: Not on file   Years of education: Not on file   Highest education level: Not on file  Occupational History   Not on file  Tobacco Use   Smoking status: Former   Smokeless tobacco: Never  Vaping Use   Vaping Use: Never used  Substance and Sexual Activity   Alcohol use: Never   Drug use: Never   Sexual activity: Not Currently  Other Topics Concern   Not on file  Social History Narrative   Not on file   Social Determinants of Health   Financial Resource Strain: Not on file  Food Insecurity: Not on file  Transportation Needs: Not on file  Physical  Activity: Not on file  Stress: Not on file  Social Connections: Not on file  Intimate Partner Violence: Not on file    Family History:    Family History  Problem Relation Age of Onset   Heart attack Father 26   Heart attack Sister      ROS:  Please see the history of present illness.  Review of Systems  Constitutional:  Positive for malaise/fatigue.  Respiratory:  Positive for shortness of breath.   Cardiovascular:  Positive for chest pain and leg swelling.  Gastrointestinal:  Positive for abdominal pain.  Neurological:  Positive for weakness.    All other ROS reviewed and negative.     Physical Exam/Data:   Vitals:   05/01/22 2237 05/01/22 2300 05/02/22 0457 05/02/22 0753  BP: (!) 172/75  133/70 (!) 143/73  Pulse: 66  72 72  Resp: '16  15 20  '$ Temp: (!) 97.5 F (36.4 C)  98.3 F (36.8 C) 97.7 F (36.5 C)  TempSrc:  Oral Oral Oral  SpO2: 100%  99% 96%  Weight:      Height:        Intake/Output Summary (Last 24 hours) at 05/02/2022 1232 Last data filed at 05/02/2022 1000 Gross per 24 hour  Intake 474 ml  Output 1150 ml  Net -676 ml      05/01/2022   11:13 AM 04/03/2022    1:56 PM 11/27/2021    2:44 PM  Last 3 Weights  Weight (lbs) 250 lb 259 lb 270 lb  Weight (kg) 113.399 kg 117.482 kg  122.471 kg     Body mass index is 36.92 kg/m.  General:  Well nourished, well developed, in no acute distress, visibly fatigued HEENT: normal Neck: no JVD Vascular: No carotid bruits; Distal pulses 2+ bilaterally Cardiac:  normal S1, S2; RRR; no murmur  Lungs:  clear to auscultation bilaterally, no wheezing, rhonchi or rales respirations are unlabored at rest on room air Abd: soft, nontender, no hepatomegaly  Ext: Trace pretibial edema Musculoskeletal:  No deformities, BUE and BLE strength normal and equal Skin: warm and dry  Neuro:  CNs 2-12 intact, no focal abnormalities noted Psych:  Normal affect   EKG:  The EKG was personally reviewed and demonstrates: Sinus rhythm rate of 77, first-degree AV block, incomplete right bundle branch block and a left axis deviation Telemetry:  Telemetry was personally reviewed and demonstrates: Sinus rhythm with a first-degree AV block rate of 60-70  Relevant CV Studies: Echo 2022  1. Left ventricular ejection fraction, by estimation, is 55 to 60%. The  left ventricle has normal function. The left ventricle has no regional  wall motion abnormalities. There is mild left ventricular hypertrophy.  Left ventricular diastolic parameters  are indeterminate.   2. Right ventricular systolic function is normal. The right ventricular  size is normal. Tricuspid regurgitation signal is inadequate for assessing  PA pressure.   3. The mitral valve is normal in structure. No evidence of mitral valve  regurgitation. No evidence of mitral stenosis.   4. The aortic valve has been repaired/replaced. Aortic valve  regurgitation is not visualized. No aortic stenosis is present. Echo  findings are consistent with normal structure and function of the aortic  valve prosthesis.   5. The inferior vena cava is normal in size with greater than 50%  respiratory variability, suggesting right atrial pressure of 3 mmHg.    Myoview Lexiscan 2022 Narrative & Impression       Low risk, probably abnormal pharmacologic  myocardial perfusion stress test.   There is a small in size, mild in severity, reversible defect involving the apical lateral and apical inferior segments most consistent with subtle ischemia but cannot rule out artifact.   There is no significant scar.   Left ventricular function is normal (LVEF 55-65%).   Attenuation correction CT demonstrates coronary artery calcification, aortic atherosclerosis, and sequelae of prior CABG and aortic valve replacement.    Laboratory Data:  High Sensitivity Troponin:   Recent Labs  Lab 05/01/22 1120 05/01/22 1653  TROPONINIHS 32* 32*     Chemistry Recent Labs  Lab 05/01/22 1120 05/02/22 0405  NA 136 137  K 3.2* 3.6  CL 106 106  CO2 21* 25  GLUCOSE 111* 198*  BUN 14 12  CREATININE 1.00 0.95  CALCIUM 7.9* 7.8*  GFRNONAA >60 >60  ANIONGAP 9 6    No results for input(s): "PROT", "ALBUMIN", "AST", "ALT", "ALKPHOS", "BILITOT" in the last 168 hours. Lipids No results for input(s): "CHOL", "TRIG", "HDL", "LABVLDL", "LDLCALC", "CHOLHDL" in the last 168 hours.  Hematology Recent Labs  Lab 05/01/22 1120 05/01/22 1653 05/02/22 0405  WBC 4.9  --  5.2  RBC 2.22* 2.41* 2.77*  HGB 8.0*  --  9.5*  HCT 25.0*  --  29.8*  MCV 112.6*  --  107.6*  MCH 36.0*  --  34.3*  MCHC 32.0  --  31.9  RDW 16.3*  --  20.5*  PLT 168  --  157   Thyroid No results for input(s): "TSH", "FREET4" in the last 168 hours.  BNPNo results for input(s): "BNP", "PROBNP" in the last 168 hours.  DDimer No results for input(s): "DDIMER" in the last 168 hours.   Radiology/Studies:  DG Chest 2 View  Result Date: 05/01/2022 CLINICAL DATA:  Left chest pain EXAM: CHEST - 2 VIEW COMPARISON:  06/02/2021 FINDINGS: Cardiac size is within normal limits. There are metallic sutures in the sternum. There are no signs of pulmonary edema or focal pulmonary consolidation. There is no pleural effusion or pneumothorax. IMPRESSION: There are no  signs of pulmonary edema or focal pulmonary consolidation. Electronically Signed   By: Elmer Picker M.D.   On: 05/01/2022 12:18     Assessment and Plan:   Symptomatic anemia -Presented with progressive weakness, fatigue, shortness of breath, and chest pain -Hemoglobin 8 on arrival transfused 1 unit of packed red blood cells in the emergency department -Hemoglobin 9.5 this morning with hematocrit of 29.8 -Continues with complaints of lower abdominal pain -Aspirin and apixaban remain on hold -Recommend FOBT and possible GI consultation if hemoglobin continues to drop -Daily CBC  Coronary artery disease status post CABG x2 with stable angina -Chest pain-free this morning -High-sensitivity troponins trended 32 and 32 flat -Ischemic changes noted on telemetry or EKG -Likely secondary to symptomatic anemia -Patient states he is scheduled for left heart catheterization on Friday of the upcoming week -Advised patient we will need to determine source of bleeding prior to intervention due to large amount of blood thinners he is given during procedure -Aspirin remains on hold -Continue Ranexa and statin therapy -recommend restarting toprol XL, Imdur, and lasix -Continue cardiac monitor  Paroxysmal atrial fibrillation -Currently maintaining sinus rhythm -Restart amiodarone 200 mg daily -Eliquis currently on hold for symptomatic anemia  Hyperlipidemia -LDL 49 at goal -Continue atorvastatin 80 mg daily  Essential hypertension -Blood pressure 143/73 -Restart Toprol-XL 25 mg daily -Vital signs per unit protocol  Obstructive sleep apnea -CPAP at night -Managed by primary team  Risk Assessment/Risk Scores:     TIMI Risk Score for Unstable Angina or Non-ST Elevation MI:   The patient's TIMI risk score is 5, which indicates a 26% risk of all cause mortality, new or recurrent myocardial infarction or need for urgent revascularization in the next 14 days.          For  questions or updates, please contact [place] Please consult [email or web address] for contact info under    Signed, Dezra Mandella, NP  05/02/2022 12:32 PM

## 2022-05-02 NOTE — Plan of Care (Signed)
  Problem: Fluid Volume: Goal: Ability to maintain a balanced intake and output will improve Outcome: Progressing   Problem: Skin Integrity: Goal: Risk for impaired skin integrity will decrease Outcome: Progressing   Problem: Activity: Goal: Risk for activity intolerance will decrease Outcome: Progressing   Problem: Coping: Goal: Level of anxiety will decrease Outcome: Progressing

## 2022-05-02 NOTE — Progress Notes (Signed)
*  PRELIMINARY RESULTS* Echocardiogram 2D Echocardiogram has been performed.  Jonathan Cross 05/02/2022, 12:40 PM

## 2022-05-02 NOTE — Progress Notes (Signed)
Discharge instructions reviewed with patient/son including followup visits and new medications/medication changes.  Understanding was verbalized and all questions were answered.  IV removed without complication; patient tolerated well.  Patient discharged home via wheelchair in stable condition escorted by nursing staff.

## 2022-05-02 NOTE — Evaluation (Signed)
Physical Therapy Evaluation Patient Details Name: Jonathan Cross. MRN: [id number] DOB: [birth year] Today's Date: 05/02/2022  History of Present Illness  76 year old male with history of hyperlipidemia, hypertension, atrial fibrillation on Eliquis, GERD, insulin-dependent diabetes mellitus, CAD status post prior CABG, who presents emergency department for chief concerns of chest pain and shortness of breath.   Clinical Impression  Pt admitted with above diagnosis. Pt received upright agreeable to limited PT eval due to fatigue, lightheadedness. 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 Lives with daughter who can assist pt as needed.   To date, pt visibly fatigued, HR trending in upper 80's at rest. Pt stands supervision using RW to ambulate 25'. Consistent gait speed and step through pattern but velocity is decreased. Pt safe with turns returning to sitting with HR at 88 BPM endorsing need to lay down. Returns to supine at supervision level.  Pt has concerns of his current medical signs/symptoms. Education provided on how low hemoglobin can affect functional mobility, energy levels, etc. Educated on benefits of RW for safety, energy conservation, decreasing falls risk. Encouraged to f/u with an OP cardiopulmonary rehab program to improve strength/endurance and SOB that pt has been experiencing at baseline. Pt verbalized understanding. All needs in reach. No f/u needs identified currently. Will continue to follow during acute stay to optimize safety, strength and endurance deficits. Pt currently with functional limitations due to the deficits listed below (see PT Problem List). Pt will benefit from skilled PT to increase their independence and safety with mobility to allow discharge to the venue listed below.         Recommendations for follow up therapy are one component of a multi-disciplinary discharge planning process, led by the attending physician.  Recommendations may be updated  based on patient status, additional functional criteria and insurance authorization.  Follow Up Recommendations No PT follow up (Would benefit from OP cardiopulm rehab program)      Assistance Recommended at Discharge Intermittent Supervision/Assistance  Patient can return home with the following  A little help with walking and/or transfers;Assistance with cooking/housework    Equipment Recommendations None recommended by PT  Recommendations for Other Services       Functional Status Assessment Patient has had a recent decline in their functional status and demonstrates the ability to make significant improvements in function in a reasonable and predictable amount of time.     Precautions / Restrictions Precautions Precautions: Fall Restrictions Weight Bearing Restrictions: No      Mobility  Bed Mobility Overal bed mobility: Modified Independent             General bed mobility comments: sit to supine Patient Response: Cooperative (appears visibly fatigued)  Transfers Overall transfer level: Needs assistance Equipment used: Rolling walker (2 wheels) Transfers: Sit to/from Stand Sit to Stand: Supervision           General transfer comment: supervision with RW    Ambulation/Gait Ambulation/Gait assistance: Supervision Gait Distance (Feet): 25 Feet Assistive device: Rolling walker (2 wheels) Gait Pattern/deviations: Step-through pattern          Stairs            Wheelchair Mobility    Modified Rankin (Stroke Patients Only)       Balance Overall balance assessment: Needs assistance Sitting-balance support: Feet supported Sitting balance-Leahy Scale: Good     Standing balance support: Reliant on assistive device for balance, During functional activity, Bilateral upper extremity supported Standing balance-Leahy Scale: Fair  Pertinent Vitals/Pain Pain Assessment Pain Assessment: Faces Faces Pain  Scale: Hurts a little bit Pain Location: chest pain Pain Descriptors / Indicators: Aching, Discomfort Pain Intervention(s): Limited activity within patient's tolerance    Home Living Family/patient expects to be discharged to:: Private residence Living Arrangements: Children Available Help at Discharge: Family;Available PRN/intermittently Type of Home: House Home Access: Stairs to enter;Ramped entrance (ramp in garage. How pt primarily enters/exits home.) Entrance Stairs-Rails: Right;Left;Can reach both Entrance Stairs-Number of Steps: 3   Home Layout: One level Home Equipment: Grab bars - tub/shower;Rolling Walker (2 wheels);Shower seat;Cane - single point      Prior Function Prior Level of Function : Independent/Modified Independent;Working/employed             Mobility Comments: Walks without AD and endorses furniture walking. No falls this year but reports being unsteady. ADLs Comments: IND in ADL. Lives with daughter, who handles shopping, cooking, cleaning     Hand Dominance   Dominant Hand: Right    Extremity/Trunk Assessment   Upper Extremity Assessment Upper Extremity Assessment: Generalized weakness    Lower Extremity Assessment Lower Extremity Assessment: Generalized weakness       Communication   Communication: No difficulties  Cognition Arousal/Alertness: Awake/alert Behavior During Therapy: WFL for tasks assessed/performed                                            General Comments      Exercises Other Exercises Other Exercises: D/c recs, benefits of RW for energy conservation compared to furniture walking and decreased falls risk.   Assessment/Plan    PT Assessment Patient needs continued PT services  PT Problem List Decreased strength;Decreased mobility;Decreased safety awareness;Decreased activity tolerance;Decreased balance       PT Treatment Interventions DME instruction;Therapeutic exercise;Gait training;Balance  training;Stair training;Neuromuscular re-education;Functional mobility training;Therapeutic activities;Patient/family education    PT Goals (Current goals can be found in the Care Plan section)  Acute Rehab PT Goals Patient Stated Goal: improve breathing and fatigue PT Goal Formulation: With patient Time For Goal Achievement: 05/16/22 Potential to Achieve Goals: Fair    Frequency Min 2X/week     Co-evaluation               AM-PAC PT "6 Clicks" Mobility  Outcome Measure Help needed turning from your back to your side while in a flat bed without using bedrails?: None Help needed moving from lying on your back to sitting on the side of a flat bed without using bedrails?: None Help needed moving to and from a bed to a chair (including a wheelchair)?: A Little Help needed standing up from a chair using your arms (e.g., wheelchair or bedside chair)?: A Little Help needed to walk in hospital room?: A Little Help needed climbing 3-5 steps with a railing? : A Lot 6 Click Score: 19    End of Session Equipment Utilized During Treatment: Gait belt Activity Tolerance: Patient limited by fatigue Patient left: in bed Nurse Communication: Mobility status PT Visit Diagnosis: Other abnormalities of gait and mobility (R26.89);Muscle weakness (generalized) (M62.81);Pain Pain - part of body:  (chest discomfort)    Time: 9833-8250 PT Time Calculation (min) (ACUTE ONLY): 12 min   Charges:   PT Evaluation $PT Eval Moderate Complexity: [place] M. Fairly IV, PT, DPT Physical Therapist- [facility]  05/02/2022, 1:56 PM

## 2022-05-02 NOTE — Inpatient Diabetes Management (Signed)
Inpatient Diabetes Program Recommendations  AACE/ADA: New Consensus Statement on Inpatient Glycemic Control (2015)  Target Ranges:  Prepandial:   less than 140 mg/dL      Peak postprandial:   less than 180 mg/dL (1-2 hours)      Critically ill patients:  140 - 180 mg/dL   Lab Results  Component Value Date   GLUCAP 240 (H) 05/02/2022   HGBA1C 8.3 (H) 05/02/2022    Review of Glycemic Control  Latest Reference Range & Units 05/02/22 11:35  Glucose-Capillary 70 - 99 mg/dL 240 (H)  (H): Data is abnormally high Diabetes history: Type 2 DM Outpatient Diabetes medications: Novolog 70/30 120 units BID Current orders for Inpatient glycemic control: Novolog 0-20 units TID & HS  Inpatient Diabetes Program Recommendations:    Consider adding Semglee 20 units QHS.   Thanks, Bronson Curb, MSN, RNC-OB Diabetes Coordinator [phone] (8a-5p)

## 2022-05-05 LAB — URINE CULTURE: Culture: >=100000 — AB

## 2022-05-08 ENCOUNTER — Encounter: Payer: Self-pay | Admitting: Nurse Practitioner

## 2022-05-08 ENCOUNTER — Ambulatory Visit: Payer: Medicare HMO | Attending: Nurse Practitioner | Admitting: Nurse Practitioner

## 2022-05-08 VITALS — BP 100/50 | HR 73 | Ht 69.0 in | Wt 258.0 lb

## 2022-05-08 DIAGNOSIS — I25119 Atherosclerotic heart disease of native coronary artery with unspecified angina pectoris: Secondary | ICD-10-CM

## 2022-05-08 DIAGNOSIS — I5032 Chronic diastolic (congestive) heart failure: Secondary | ICD-10-CM | POA: Diagnosis not present

## 2022-05-08 DIAGNOSIS — I10 Essential (primary) hypertension: Secondary | ICD-10-CM

## 2022-05-08 DIAGNOSIS — Z952 Presence of prosthetic heart valve: Secondary | ICD-10-CM

## 2022-05-08 DIAGNOSIS — I480 Paroxysmal atrial fibrillation: Secondary | ICD-10-CM

## 2022-05-08 DIAGNOSIS — R072 Precordial pain: Secondary | ICD-10-CM

## 2022-05-08 DIAGNOSIS — G4733 Obstructive sleep apnea (adult) (pediatric): Secondary | ICD-10-CM

## 2022-05-08 DIAGNOSIS — E785 Hyperlipidemia, unspecified: Secondary | ICD-10-CM

## 2022-05-08 DIAGNOSIS — D539 Nutritional anemia, unspecified: Secondary | ICD-10-CM

## 2022-05-08 NOTE — Patient Instructions (Signed)
Medication Instructions:  No changes at this time.   *If you need a refill on your cardiac medications before your next appointment, please call your pharmacy*   Lab Work: None  If you have labs (blood work) drawn today and your tests are completely normal, you will receive your results only by: [place] (if you have MyChart) OR A paper copy in the mail If you have any lab test that is abnormal or we need to change your treatment, we will call you to review the results.   Testing/Procedures: None   Follow-Up: At [facility], you and your health needs are our priority.  As part of our continuing mission to provide you with exceptional heart care, we have created designated Provider Care Teams.  These Care Teams include your primary Cardiologist (physician) and Advanced Practice Providers (APPs -  Physician Assistants and Nurse Practitioners) who all work together to provide you with the care you need, when you need it.   Your next appointment:   1 month(s)  The format for your next appointment:   In Person  Provider:   Nelva Bush, MD  Only   Important Information About Sugar

## 2022-05-08 NOTE — Progress Notes (Signed)
Office Visit    Patient Name: Jonathan Cross. 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 04/2022 Echo: EF 60-65%, no rwma, GrII DD, nl RV fxn, mildly dil LA, mild MR, nl fxn'ing biopros AoV.   Coronary artery disease    a. 01/2016 CABG x 2: LIMA->LAD, VG->OM; b. 05/2021 MV: small, mild, rev apical lateral and apical inf defects ->subtle ischemia vs artifact, EF 55-65%-->low risk.   Diabetes ([place])    H/O aortic valve replacement    a. 01/2016 s/p bioprosthetic AVR @ [facility] for Severe AS; b. 05/2021 Echo: EF 55-60%, nl fxn of bioprosthetic AVR; c. 04/2022 Echo: EF 60-65%, nl fxn'ing AoV.   History of kidney stones    Hx of CABG    Hyperlipidemia    Hypertension    PAF (paroxysmal atrial fibrillation) (HCC)    a. CHA2DS2VASc = 5-->eliquis/amio.   Sleep apnea    Past Surgical History:  Procedure Laterality Date   AORTIC VALVE REPLACEMENT  2017   [facility], bioprosthetic   APPENDECTOMY     CARDIAC VALVE REPLACEMENT     CORONARY ARTERY BYPASS GRAFT  2017   [facility] - LIMA-LAD and SVG-OM   CYSTOSCOPY W/ RETROGRADES Right 05/27/2019   Procedure: CYSTOSCOPY WITH RETROGRADE PYELOGRAM;  Surgeon: Billey Co, MD;  Location: [facility];  Service: Urology;  Laterality: Right;   CYSTOSCOPY W/ RETROGRADES Right 07/11/2019   Procedure:  CYSTOSCOPY WITH RETROGRADE PYELOGRAM;  Surgeon: Billey Co, MD;  Location: [facility];  Service: Urology;  Laterality: Right;   CYSTOSCOPY/URETEROSCOPY/HOLMIUM LASER/STENT PLACEMENT Right 05/27/2019   Procedure: CYSTOSCOPY/URETEROSCOPY/HOLMIUM LASER/STENT PLACEMENT;  Surgeon: Billey Co, MD;  Location: [facility];  Service: Urology;  Laterality: Right;   CYSTOSCOPY/URETEROSCOPY/HOLMIUM LASER/STENT PLACEMENT Right 06/17/2019   Procedure: CYSTOSCOPY/URETEROSCOPY/HOLMIUM LASER/STENT Exchange;  Surgeon: Billey Co, MD;  Location: [facility];  Service: Urology;  Laterality: Right;   CYSTOSCOPY/URETEROSCOPY/HOLMIUM LASER/STENT PLACEMENT Right 07/11/2019   Procedure: CYSTOSCOPY/URETEROSCOPY/HOLMIUM LASER/STENT Exchange;  Surgeon: Billey Co, MD;  Location: [facility];  Service: Urology;  Laterality: Right;   STONE EXTRACTION WITH BASKET Right 07/11/2019   Procedure: STONE EXTRACTION WITH BASKET;  Surgeon: Billey Co, MD;  Location: [facility];  Service: Urology;  Laterality: Right;   TEE WITHOUT CARDIOVERSION N/A 04/21/2019   Procedure: TRANSESOPHAGEAL ECHOCARDIOGRAM (TEE);  Surgeon: Minna Merritts, MD;  Location: [facility];  Service: Cardiovascular;  Laterality: N/A;   TEE WITHOUT CARDIOVERSION N/A 07/04/2019   Procedure: TRANSESOPHAGEAL ECHOCARDIOGRAM (TEE);  Surgeon: Wellington Hampshire, MD;  Location: [facility];  Service: Cardiovascular;  Laterality: N/A;   TEE WITHOUT CARDIOVERSION N/A 08/17/2019   Procedure: TRANSESOPHAGEAL ECHOCARDIOGRAM (TEE);  Surgeon: Kate Sable, MD;  Location: [facility];  Service: Cardiovascular;  Laterality: N/A;   TEE WITHOUT CARDIOVERSION N/A 11/15/2019   Procedure: TRANSESOPHAGEAL ECHOCARDIOGRAM (TEE);  Surgeon: Nelva Bush, MD;  Location: [facility];  Service: Cardiovascular;  Laterality: N/A;   TONSILLECTOMY      Allergies  Allergies  Allergen Reactions   Metformin Diarrhea    Loose stools even with XR    History of Present Illness     76 year old male with above past medical history following CAD status post CABG x2, severe aortic stenosis status post bioprosthetic aortic valve replacement, bacterial endocarditis, paroxysmal atrial fibrillation, hypertension, hyperlipidemia, type 2 diabetes mellitus, sleep apnea, and BPH.  He was previously followed at [facility] in the setting of severe aortic stenosis and underwent bioprosthetic aortic valve replacement two-vessel coronary artery bypass grafting in July 2017.  Between October 2020 in May 2021, he had multiple admissions related to fever, malaise, and Enterococcus faecalis bacteremia.  TEEs in October 2020 and December 2020 were negative regurgitation.  Every 2021, he was found to have a mobile mass on his prosthetic aortic valve and was seen by infectious disease treated with appropriate antibiotics for 6 weeks in the outpatient setting.  Prior to PICC line removal, repeat TEE in May 2021 continue to show an echodensity that appears less mobile but larger, measuring 1.1 x 0.6 cm.  Perivalvular abscess could not be excluded.  He was transferred to [facility] and underwent cardiac CT which showed evidence of a small mass in the leaflet of the noncoronary cusp, likely representing a healed vegetation.  Leaflets were frequently movable without evidence of obstruction.  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  The LIMA to the LAD vein graft to the obtuse marginal were patent.  He was seen by CT surgery who recommended continued observation and antibiotic therapy.  In November 2022, he was admitted with dyspnea and chest fullness and found to be in rapid atrial fibrillation, which converted to sinus rhythm on intravenous amiodarone.  He underwent stress testing in the setting of chest pain, which showed a small, mild, reversible apical lateral and apical inferior defect, with question of subtle ischemia versus infarct.   Ultimately was felt to be a low risk study and he was medically managed.  In April 2023, he was evaluated in the outpatient setting with progressive dyspnea and 30 pound weight gain and he was placed on Lasix therapy.  He does not have any significant change in weight or symptoms.  In September 2023, he reported 3 to 4-week history of worsening chest pain.  We did discuss possible pursuit of right left cardiac catheterization however, he wished to defer.  Unfortunately, he was admitted to [facility] regional on October 19 secondary to severe chest pain for greater than 24 hours.  Troponin was minimally elevated with a flat trend at 32   32.  He was found to be anemic with an H&H of 8.0/25.  He was admitted and was treated with 1 unit of packed red blood cells with improvement in H&H to 9.5/29.8.  He was seen by our team with recommendation for medical therapy as echo showed normal LV function with normal functioning bioprosthetic aortic valve.  It was felt that at some point he may require diagnostic catheterization however, we would like to see anemia work-up first.  He was subsequently discharged home and Eliquis was placed on hold.  He has follow-up with hematology with plan for bone marrow biopsy and additional labs in early November.  He has continued to have daily episodes of chest pain which can last hours at a time.  He was seen in the  [facility] emergency department on October 25 due to recurrent and persistent chest pain.  Troponins were normal.  H&H had improved to 10.5 and 30.3 respectively.  Chest x-ray was unremarkable and he was subsequently discharged home.  Patient reports recurrent chest pain today currently rated as 5/10.  He says he is very tired and intermittently lightheaded.  He has chronic, stable dyspnea on exertion.  His wife is present with him today.  Chest pain is somewhat worse with palpation over the last chest.  He denies PND, orthopnea, syncope, or early satiety.  His chronic, mild lower  extremity swelling.  Home Medications    Current Outpatient Medications  Medication Sig Dispense Refill   amiodarone (PACERONE) 200 MG tablet Take 1 tablet by mouth once daily 90 tablet 0   aspirin EC 81 MG tablet Take 81 mg by mouth daily. Swallow whole.     atorvastatin (LIPITOR) 80 MG tablet Take 1 tablet (80 mg total) by mouth every evening. 90 tablet 3   ciprofloxacin (CIPRO) 500 MG tablet Take 1 tablet (500 mg total) by mouth 2 (two) times daily for 6 days. 12 tablet 0   furosemide (LASIX) 40 MG tablet Take 1 tablet (40 mg total) by mouth daily. 90 tablet 3   insulin NPH-regular Human (70-30) 100 UNIT/ML injection Inject 120 Units into the skin 2 (two) times daily.     isosorbide mononitrate (IMDUR) 30 MG 24 hr tablet Take 0.5 tablets (15 mg total) by mouth daily. 45 tablet 3   loratadine (CLARITIN) 10 MG tablet Take 10 mg by mouth daily.     Magnesium Oxide 400 MG CAPS Take 1 capsule (400 mg total) by mouth daily. 90 capsule 3   meclizine (ANTIVERT) 25 MG tablet Take 1 tablet (25 mg total) by mouth 3 (three) times daily as needed for dizziness. 20 tablet 0   metoprolol succinate (TOPROL-XL) 25 MG 24 hr tablet Take 25 mg by mouth 2 (two) times daily.     nitroGLYCERIN (NITROSTAT) 0.4 MG SL tablet Place 0.4 mg under the tongue every 5 (five) minutes x 3 doses as needed for chest pain.      ranolazine (RANEXA) 500 MG 12 hr tablet Take 1 tablet (500 mg total) by mouth 2 (two) times daily. 60 tablet 5   tamsulosin (FLOMAX) 0.4 MG CAPS capsule Take 0.4 mg by mouth daily.     No current facility-administered medications for this visit.     Review of Systems    Ongoing intermittent chest pain which lasts hours at a time on a daily basis.  Multiple evaluations as outlined above.  He has chronic, stable dyspnea on exertion.  He has been having intermittent lightheadedness.  He has chronic, mild lower extremity swelling.  He denies palpitations, PND, orthopnea, syncope, or early satiety.  All  other systems reviewed and are otherwise negative except as noted above.    Physical Exam    VS:  BP (!) 100/50 (BP Location: Left Arm, Patient Position: Sitting, Cuff Size: Normal)   Pulse 73   Ht _0  (1.753 m)   Wt 258 lb (117 kg)   SpO2 95%   BMI 38.10 kg/m  , BMI Body mass index is 38.1 kg/m.     GEN: Obese, in no acute distress. HEENT: normal. Neck: Supple, obese, difficult to gauge JVP.  No bruits or masses.   Cardiac: RRR, 2/6 systolic murmur at the upper sternal borders.  No rubs or gallops.  No clubbing, cyanosis, trace bilateral  lower extremity edema.  Radials/PT 2+ and equal bilaterally.  Respiratory:  Respirations regular and unlabored, clear to auscultation bilaterally. GI: Obese, protuberant, nontender, bowel sounds present x4.  MS: no deformity or atrophy. Skin: warm and dry, no rash. Neuro:  Strength and sensation are intact. Psych: Normal affect.  Accessory Clinical Findings    ECG personally reviewed by me today -regular sinus rhythm, 73, left axis deviation, prior septal infarct, prolonged QT (470 ms)- no acute changes.  Lab Results  Component Value Date   WBC 5.2 05/02/2022   HGB 9.5 (L) 05/02/2022   HCT 29.8 (L) 05/02/2022   MCV 107.6 (H) 05/02/2022   PLT 157 05/02/2022   Lab Results  Component Value Date   CREATININE 0.95 05/02/2022   BUN 12 05/02/2022   NA 137 05/02/2022   K 3.6 05/02/2022   CL 106 05/02/2022   CO2 25 05/02/2022   Lab Results  Component Value Date   ALT 33 11/01/2021   AST 27 11/01/2021   ALKPHOS 53 11/01/2021   BILITOT 0.5 11/01/2021   Lab Results  Component Value Date   CHOL 92 06/03/2021   HDL 16 (L) 06/03/2021   LDLCALC 49 06/03/2021   TRIG 134 06/03/2021   CHOLHDL 5.8 06/03/2021    Lab Results  Component Value Date   HGBA1C 8.3 (H) 05/02/2022    Assessment & Plan    1.  Precordial chest pain/coronary artery disease: Status post CABG x2 in July 2017.  Over the past month or more, he has been having  frequent intermittent chest pain which lasts hours at a time.  He has been evaluated now on 2 occasions, once at [facility] last week with mild troponin elevation of 32 and echo showing normal LV function without regional motion abnormalities.  He was evaluated again on October 25 at [facility] in the setting of several hours of chest pain with completely normal troponins.  Today, he reports 5/10 chest pain.  There is some worsening of his pain with palpation over his last chest though I do not think this entirely explains his symptoms.  His symptoms are out of proportion to objective findings as his ECG is without acute ST or T changes today and prior enzymatic evaluation and echocardiogram are reassuring.  We discussed that ultimately, he will likely require diagnostic catheterization to clear the air however, in the setting of recent finding of anemia, we prefer to defer until evaluation is complete as any intervention would likely prompt addition of antiplatelet therapy which would have to be uninterrupted.  Patient and wife understand.  In the interim, we will continue medical therapy including low-dose aspirin, beta-blocker, statin, nitrate, and Ranexa.  In the setting of mild prolonged QT, will hold off on any further titration of Ranexa at this time.  2.  Chronic heart failure with preserved ejection fraction: Recent echo with an EF of 60 to 65% and grade 2 diastolic dysfunction.  He has chronic, mild/trace lower extremity swelling which is present and stable today.  Labs at [facility] 2 days ago showed stable renal function and electrolytes.  He remains on Lasix 40 mg daily with stable heart rate and blood pressure.  3.  Essential hypertension: Blood pressure soft today at 100/50.  He does have a history of intermittent soft pressures with associated lightheadedness.  4.  Hyperlipidemia: LDL 49 November 2022 with normal LFTs and April of this year.  Continue statin therapy.  5.  Paroxysmal atrial fibrillation:  Diagnosed in November 2022.  He has been maintaining sinus rhythm on amiodarone therapy.  Eliquis currently on hold in the setting of anemia work-up.  6.  Macrocytic anemia: Status post 1 unit of packed red blood cells during hospitalization last week.  Seen by hematology with plan for bone marrow biopsy and additional lab work in 2 weeks.  We will await evaluation by hematology prior to considering diagnostic catheterization.  7.  Aortic stenosis: Status post bioprosthetic arctic valve complicated by endocarditis.  Echo earlier this month with normal functioning valve.  8.  Type 2 diabetes mellitus: A1c 8.3 earlier this month.  He is followed by primary care and is on insulin therapy.  9.  Obstructive sleep apnea: Patient wears BiPAP at night.  He often experiences daytime somnolence despite compliance with BiPAP over the past month.  I encouraged him to follow-up with sleep medicine provider for consideration of a repeat study with titration.  10.  Disposition: Patient will follow-up here in 1 month.   Murray Hodgkins, NP 05/08/2022, 11:21 AM

## 2022-05-12 ENCOUNTER — Encounter (INDEPENDENT_AMBULATORY_CARE_PROVIDER_SITE_OTHER): Payer: Self-pay

## 2022-05-14 DIAGNOSIS — D469 Myelodysplastic syndrome, unspecified: Secondary | ICD-10-CM

## 2022-05-14 HISTORY — DX: Myelodysplastic syndrome, unspecified: D46.9

## 2022-06-12 ENCOUNTER — Encounter: Payer: Self-pay | Admitting: Internal Medicine

## 2022-06-12 ENCOUNTER — Ambulatory Visit: Payer: Medicare HMO | Attending: Internal Medicine | Admitting: Internal Medicine

## 2022-06-12 VITALS — BP 114/50 | HR 72 | Ht 69.0 in | Wt 265.0 lb

## 2022-06-12 DIAGNOSIS — I480 Paroxysmal atrial fibrillation: Secondary | ICD-10-CM | POA: Diagnosis not present

## 2022-06-12 DIAGNOSIS — I200 Unstable angina: Secondary | ICD-10-CM | POA: Insufficient documentation

## 2022-06-12 DIAGNOSIS — D469 Myelodysplastic syndrome, unspecified: Secondary | ICD-10-CM | POA: Insufficient documentation

## 2022-06-12 DIAGNOSIS — I5032 Chronic diastolic (congestive) heart failure: Secondary | ICD-10-CM | POA: Diagnosis not present

## 2022-06-12 DIAGNOSIS — Z952 Presence of prosthetic heart valve: Secondary | ICD-10-CM

## 2022-06-12 NOTE — Patient Instructions (Addendum)
Medication Instructions:  START: Asprin 81 mg daily  *If you need a refill on your cardiac medications before your next appointment, please call your pharmacy*   Lab Work: None ordered  If you have labs (blood work) drawn today and your tests are completely normal, you will receive your results only by: [facility] (if you have MyChart) OR A paper copy in the mail If you have any lab test that is abnormal or we need to change your treatment, we will call you to review the results.   Testing/Procedures: Your physician has requested that you have a cardiac catheterization. Cardiac catheterization is used to diagnose and/or treat various heart conditions. Doctors may recommend this procedure for a number of different reasons. The most common reason is to evaluate chest pain. Chest pain can be a symptom of coronary artery disease (CAD), and cardiac catheterization can show whether plaque is narrowing or blocking your heart's arteries. This procedure is also used to evaluate the valves, as well as measure the blood flow and oxygen levels in different parts of your heart. For further information please visit [email or web address]. Please follow instruction sheet, as given.   Follow-Up: At [facility], you and your health needs are our priority.  As part of our continuing mission to provide you with exceptional heart care, we have created designated Provider Care Teams.  These Care Teams include your primary Cardiologist (physician) and Advanced Practice Providers (APPs -  Physician Assistants and Nurse Practitioners) who all work together to provide you with the care you need, when you need it.  We recommend signing up for the patient portal called "MyChart".  Sign up information is provided on this After Visit Summary.  MyChart is used to connect with patients for Virtual Visits (Telemedicine).  Patients are able to view lab/test results, encounter notes, upcoming appointments, etc.   Non-urgent messages can be sent to your provider as well.   To learn more about what you can do with MyChart, go to [email or web address].    Your next appointment:   2 week(s)  The format for your next appointment:   In Person  Provider:   You may see Nelva Bush, MD or one of the following Advanced Practice Providers on your designated Care Team:   Murray Hodgkins, NP Christell Faith, PA-C Cadence Kathlen Mody, [place] Gerrie Nordmann, NP    Other Instructions       Cardiac/Peripheral Catheterization   You are scheduled for a Cardiac Catheterization on Tuesday, December 5 with Dr. Harrell Gave End.  1. Please arrive at the Main Entrance A at [facility]: [place], [place] [address] on December 5 at 11:30 AM (This time is one hour before your procedure to ensure your preparation). Free valet parking service is available. You will check in at ADMITTING. The support person will be asked to wait in the waiting room.  It is OK to have someone drop you off and come back when you are ready to be discharged.        Special note: Every effort is made to have your procedure done on time. Please understand that emergencies sometimes delay scheduled procedures.   . 2. Diet: Do not eat solid foods after midnight.  You may have clear liquids until 5 AM the day of the procedure.  3. Labs: You will need to have blood drawn: already completed (11/29)  4. Medication instructions in preparation for your procedure: Hold the Furosemide the morning of the procedure Hold all  the diabetic medication the morning of the procedure Take half the dose for of the insulin the night before.    On the morning of your procedure, take Aspirin 81 mg and any morning medicines NOT listed above.  You may use sips of water.  5. Plan to go home the same day, you will only stay overnight if medically necessary. 6. You MUST have a responsible adult to drive you home. 7. An adult MUST be with you the  first 24 hours after you arrive home. 8. Bring a current list of your medications, and the last time and date medication taken. 9. Bring ID and current insurance cards. 10.Please wear clothes that are easy to get on and off and wear slip-on shoes.  Thank you for allowing [place] to care for you!   -- [facility]

## 2022-06-12 NOTE — H&P (View-Only) (Signed)
Follow-up Outpatient Visit Date: 06/12/2022  Primary Care Provider: System, Provider Not In No address on file  Chief Complaint: Chest pain  HPI:  Jonathan Cross is a 76 y.o. 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  Jonathan Cross was seen in our office about a month ago by Jonathan Bayley, NP, for hospital follow-up after being admitted in mid October for chest pain.  Troponin was slightly elevated but flat in the setting of anemia with a hemoglobin of 8.0.  Jonathan Cross received PRBC transfusion x 1.  Echo showed preserved LVEF with normal bioprosthetic aortic valve function.  Home Eliquis was held with plans for outpatient hematology follow-up.  Jonathan Cross Jonathan Cross presented to the [facility] emergency department in late October with recurrent chest pain.  Troponins were normal.  Hemoglobin had improved to 10.  Jonathan Cross continued to have 5/10 chest pain at his most recent office visit; it was somewhat worsened with palpation over the left chest.  Cardiac catheterization was discussed but ultimately deferred pending further workup of anemia.  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  It was postulated that this could be contributing to his fatigue and angina.  It was felt that Jonathan Cross may benefit from treatment for his MDS with erythropoietin stimulating agent +/- lenalidomide if Jonathan Cross continued to have angina despite optimal medical therapy.  Jonathan Cross is scheduled to meet with Dr. Tasia Catchings next week to discuss therapy.  Today, Jonathan Cross continues to not feel well.  Jonathan Cross still has marked fatigue as well as intermittent chest pain.  Chest pain seems to be more positional, especially when Jonathan Cross lifts his left arm, though Jonathan Cross  thinks Jonathan Cross may also have some discomfort when Jonathan Cross walks.  Jonathan Cross gets out of breath with minimal activity.  Jonathan Cross has had minimal palpitations but thinks Jonathan Cross may have had a bout of A-fib lasting only a few seconds about a months ago.  Jonathan Cross is currently off apixaban and aspirin due to concerns for his pancytopenia.  Jonathan Cross has not had significant edema in his legs.  Jonathan Cross feels lightheaded at times but has not passed out or fallen.  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 04/2022 Echo: EF 60-65%, no rwma, GrII DD, nl RV fxn, mildly dil LA, mild MR, nl fxn'ing biopros AoV.   Coronary artery disease    a. 01/2016 CABG x 2: LIMA->LAD, VG->OM; b. 05/2021 MV: small, mild, rev apical lateral and apical inf defects ->subtle ischemia vs artifact, EF 55-65%-->low risk.   Diabetes ([place])    H/O aortic valve replacement    a. 01/2016 s/p bioprosthetic AVR @ [facility] for Severe AS; b. 05/2021 Echo: EF 55-60%, nl fxn of bioprosthetic AVR; c. 04/2022 Echo: EF 60-65%, nl fxn'ing AoV.   History of kidney stones    Hx of CABG    Hyperlipidemia    Hypertension    Myelodysplastic syndrome ([place]) 05/2022   PAF (paroxysmal atrial fibrillation) ([facility])    a. CHA2DS2VASc = 5-->eliquis/amio.   Sleep apnea    Past Surgical History:  Procedure Laterality Date   AORTIC VALVE REPLACEMENT  2017   [facility], bioprosthetic  APPENDECTOMY     BONE MARROW BIOPSY     05/2022   CARDIAC VALVE REPLACEMENT     CORONARY ARTERY BYPASS GRAFT  2017   [facility] - LIMA-LAD and SVG-OM   CYSTOSCOPY W/ RETROGRADES Right 05/27/2019   Procedure: CYSTOSCOPY WITH RETROGRADE PYELOGRAM;  Surgeon: Billey Co, MD;  Location: [facility];  Service: Urology;  Laterality: Right;   CYSTOSCOPY W/ RETROGRADES Right 07/11/2019   Procedure: CYSTOSCOPY WITH RETROGRADE PYELOGRAM;   Surgeon: Billey Co, MD;  Location: [facility];  Service: Urology;  Laterality: Right;   CYSTOSCOPY/URETEROSCOPY/HOLMIUM LASER/STENT PLACEMENT Right 05/27/2019   Procedure: CYSTOSCOPY/URETEROSCOPY/HOLMIUM LASER/STENT PLACEMENT;  Surgeon: Billey Co, MD;  Location: [facility];  Service: Urology;  Laterality: Right;   CYSTOSCOPY/URETEROSCOPY/HOLMIUM LASER/STENT PLACEMENT Right 06/17/2019   Procedure: CYSTOSCOPY/URETEROSCOPY/HOLMIUM LASER/STENT Exchange;  Surgeon: Billey Co, MD;  Location: [facility];  Service: Urology;  Laterality: Right;   CYSTOSCOPY/URETEROSCOPY/HOLMIUM LASER/STENT PLACEMENT Right 07/11/2019   Procedure: CYSTOSCOPY/URETEROSCOPY/HOLMIUM LASER/STENT Exchange;  Surgeon: Billey Co, MD;  Location: [facility];  Service: Urology;  Laterality: Right;   STONE EXTRACTION WITH BASKET Right 07/11/2019   Procedure: STONE EXTRACTION WITH BASKET;  Surgeon: Billey Co, MD;  Location: [facility];  Service: Urology;  Laterality: Right;   TEE WITHOUT CARDIOVERSION N/A 04/21/2019   Procedure: TRANSESOPHAGEAL ECHOCARDIOGRAM (TEE);  Surgeon: Minna Merritts, MD;  Location: [facility];  Service: Cardiovascular;  Laterality: N/A;   TEE WITHOUT CARDIOVERSION N/A 07/04/2019   Procedure: TRANSESOPHAGEAL ECHOCARDIOGRAM (TEE);  Surgeon: Wellington Hampshire, MD;  Location: [facility];  Service: Cardiovascular;  Laterality: N/A;   TEE WITHOUT CARDIOVERSION N/A 08/17/2019   Procedure: TRANSESOPHAGEAL ECHOCARDIOGRAM (TEE);  Surgeon: Kate Sable, MD;  Location: [facility];  Service: Cardiovascular;  Laterality: N/A;   TEE WITHOUT CARDIOVERSION N/A 11/15/2019   Procedure: TRANSESOPHAGEAL ECHOCARDIOGRAM (TEE);  Surgeon: Nelva Bush, MD;  Location: [facility];  Service: Cardiovascular;  Laterality: N/A;   TONSILLECTOMY      Current Meds  Medication Sig   amiodarone (PACERONE) 200 MG tablet Take 1 tablet by mouth once daily   aspirin EC 81 MG tablet Take 81 mg by mouth daily. Swallow  whole.   atorvastatin (LIPITOR) 80 MG tablet Take 1 tablet (80 mg total) by mouth every evening.   furosemide (LASIX) 40 MG tablet Take 1 tablet (40 mg total) by mouth daily.   insulin NPH-regular Human (70-30) 100 UNIT/ML injection Inject 120 Units into the skin 2 (two) times daily.   isosorbide mononitrate (IMDUR) 30 MG 24 hr tablet Take 0.5 tablets (15 mg total) by mouth daily.   loratadine (CLARITIN) 10 MG tablet Take 10 mg by mouth daily.   Magnesium Oxide 400 MG CAPS Take 1 capsule (400 mg total) by mouth daily.   meclizine (ANTIVERT) 25 MG tablet Take 1 tablet (25 mg total) by mouth 3 (three) times daily as needed for dizziness.   metoprolol succinate (TOPROL-XL) 25 MG 24 hr tablet Take 25 mg by mouth 2 (two) times daily.   nitroGLYCERIN (NITROSTAT) 0.4 MG SL tablet Place 0.4 mg under the tongue every 5 (five) minutes x 3 doses as needed for chest pain.    ranolazine (RANEXA) 500 MG 12 hr tablet Take 1 tablet (500 mg total) by mouth 2 (two) times daily.   tamsulosin (FLOMAX) 0.4 MG CAPS capsule Take 0.4 mg by mouth daily.    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  --------------------------------------------------------------------------------------------------  Physical Exam: BP (!) 114/50 (BP Location: Right Arm, Patient Position: Sitting, Cuff Size: Large)   Pulse 72   Ht _0  (1.753 m)   Wt 265 lb (120.2 kg)   SpO2 96%   BMI 39.13 kg/m   General:  NAD. Neck: No JVD or HJR. Lungs: Coarse breath sounds bilaterally without wheezes or crackles. Heart: Distant heart sounds.  Regular rate and  rhythm with 1/6 systolic murmur.  No rubs or gallops. Abdomen: Soft, nontender, nondistended. Extremities: Trace pretibial edema bilaterally.  EKG: Normal sinus rhythm with first-degree AV block, left axis deviation, borderline LVH, septal infarct, nonspecific ST/T changes, and mild QT prolongation.  Compared with prior tracing from 05/08/2022, PR interval has increased slightly.  Nonspecific ST/T changes are now present.  Lab Results  Component Value Date   WBC 5.2 05/02/2022   HGB 9.5 (L) 05/02/2022   HCT 29.8 (L) 05/02/2022   MCV 107.6 (H) 05/02/2022   PLT 157 05/02/2022    Lab Results  Component Value Date   NA 137 05/02/2022   K 3.6 05/02/2022   CL 106 05/02/2022   CO2 25 05/02/2022   BUN 12 05/02/2022   CREATININE 0.95 05/02/2022   GLUCOSE 198 (H) 05/02/2022   ALT 33 11/01/2021    Lab Results  Component Value Date   CHOL 92 06/03/2021   HDL 16 (L) 06/03/2021   LDLCALC 49 06/03/2021   TRIG 134 06/03/2021   CHOLHDL 5.8 06/03/2021    --------------------------------------------------------------------------------------------------  ASSESSMENT AND PLAN: Coronary artery disease with accelerating angina: Jonathan Cross continues to have chest pain and exertional dyspnea with minimal activity.  His chest pain is somewhat positional.  However, his profound exertional dyspnea and vague chest discomfort could certainly be an anginal equivalent.  This is confounded by his anemia in the setting of newly diagnosed MDS.  MPI a year ago was low risk.  However, given his progressive symptoms, we discussed close surveillance and treatment of his MDS versus cardiac catheterization.  Jonathan Cross is certainly at increased risk for complications with catheterization, which would only be diagnostic unless there is a critical lesion that necessitated urgent intervention.  Jonathan Cross wishes to move forward with catheterization as soon as possible.  We will not make any medication changes today; I am reluctant to  escalate ranolazine further given his mildly prolonged QT.  Soft blood pressure also precludes escalation of isosorbide mononitrate and metoprolol.  I will have Jonathan Cross restart low-dose aspirin.  Chronic HFpEF: Jonathan Cross appears grossly euvolemic but remains quite symptomatic with NYHA class III symptoms (anemia/MDS likely playing a large role as well).  We will continue current dose of furosemide and proceed with right heart catheterization/coronary angiography next week.  Paroxysmal atrial fibrillation: Only brief palpitations noted over the last month.  No symptoms to suggest prolonged period of atrial fibrillation reported by Jonathan Cross.  Apixaban is currently on hold, which I think is reasonable in the setting of his symptomatic anemia and MDS.  Jonathan Cross has been quite symptomatic in the past with atrial fibrillation, so I suspect his burden is fairly low.  We will continue  amiodarone.  Aortic stenosis status post bioprosthetic aortic valve: Recent echo showed appropriate function of his bioprosthetic aortic valve, which has been complicated by endocarditis in the past.  We will plan to do a right heart catheterization with upcoming coronary angiography, though I will avoid crossing the aortic valve given history of endocarditis.  Myelodysplastic syndrome: Recently diagnosed in the setting of pancytopenia with symptomatic anemia.  I would recommend trying to maintain a hemoglobin above 8.  Jonathan Cross is scheduled for consultation with Dr. Tasia Catchings on Monday.  Shared Decision Making/Informed Consent The risks [stroke (1 in 1000), death (1 in 1000), kidney failure [usually temporary] (1 in 500), bleeding (1 in 200), allergic reaction [possibly serious] (1 in 200)], benefits (diagnostic support and management of coronary artery disease) and alternatives of a cardiac catheterization were discussed in detail with Jonathan Cross and Jonathan Cross is willing to proceed.  Follow-up: Return to clinic in 2-3  weeks.  Nelva Bush, MD 06/12/2022 12:15 PM

## 2022-06-12 NOTE — Progress Notes (Signed)
Follow-up Outpatient Visit Date: 06/12/2022  Primary Care Provider: System, Provider Not In No address on file  Chief Complaint: Chest pain  HPI:  Jonathan Cross is a 76 y.o. 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  Jonathan was seen in our office about a month ago by Jonathan Bayley, NP, for hospital follow-up after being admitted in mid October for chest pain.  Troponin was slightly elevated but flat in the setting of anemia with a hemoglobin of 8.0.  Jonathan received PRBC transfusion x 1.  Echo showed preserved LVEF with normal bioprosthetic aortic valve function.  Home Eliquis was held with plans for outpatient hematology follow-up.  Jonathan Cross presented to the [facility] emergency department in late October with recurrent chest pain.  Troponins were normal.  Hemoglobin had improved to 10.  Jonathan continued to have 5/10 chest pain at his most recent office visit; it was somewhat worsened with palpation over the left chest.  Cardiac catheterization was discussed but ultimately deferred pending further workup of anemia.  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  It was postulated that this could be contributing to his fatigue and angina.  It was felt that Jonathan may benefit from treatment for his MDS with erythropoietin stimulating agent +/- lenalidomide if Jonathan continued to have angina despite optimal medical therapy.  Jonathan is scheduled to meet with Dr. Tasia Catchings next week to discuss therapy.  Today, Jonathan Cross continues to not feel well.  Jonathan still has marked fatigue as well as intermittent chest pain.  Chest pain seems to be more positional, especially when Jonathan lifts his left arm, though Jonathan  thinks Jonathan may also have some discomfort when Jonathan walks.  Jonathan gets out of breath with minimal activity.  Jonathan has had minimal palpitations but thinks Jonathan may have had a bout of A-fib lasting only a few seconds about a months ago.  Jonathan is currently off apixaban and aspirin due to concerns for his pancytopenia.  Jonathan has not had significant edema in his legs.  Jonathan feels lightheaded at times but has not passed out or fallen.  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 04/2022 Echo: EF 60-65%, no rwma, GrII DD, nl RV fxn, mildly dil LA, mild MR, nl fxn'ing biopros AoV.   Coronary artery disease    a. 01/2016 CABG x 2: LIMA->LAD, VG->OM; b. 05/2021 MV: small, mild, rev apical lateral and apical inf defects ->subtle ischemia vs artifact, EF 55-65%-->low risk.   Diabetes ([place])    H/O aortic valve replacement    a. 01/2016 s/p bioprosthetic AVR @ [facility] for Severe AS; b. 05/2021 Echo: EF 55-60%, nl fxn of bioprosthetic AVR; c. 04/2022 Echo: EF 60-65%, nl fxn'ing AoV.   History of kidney stones    Hx of CABG    Hyperlipidemia    Hypertension    Myelodysplastic syndrome ([place]) 05/2022   PAF (paroxysmal atrial fibrillation) ([facility])    a. CHA2DS2VASc = 5-->eliquis/amio.   Sleep apnea    Past Surgical History:  Procedure Laterality Date   AORTIC VALVE REPLACEMENT  2017   [facility], bioprosthetic  APPENDECTOMY     BONE MARROW BIOPSY     05/2022   CARDIAC VALVE REPLACEMENT     CORONARY ARTERY BYPASS GRAFT  2017   [facility] - LIMA-LAD and SVG-OM   CYSTOSCOPY W/ RETROGRADES Right 05/27/2019   Procedure: CYSTOSCOPY WITH RETROGRADE PYELOGRAM;  Surgeon: Billey Co, MD;  Location: [facility] ORS;  Service: Urology;  Laterality: Right;   CYSTOSCOPY W/ RETROGRADES Right 07/11/2019   Procedure: CYSTOSCOPY WITH RETROGRADE PYELOGRAM;   Surgeon: Billey Co, MD;  Location: [facility] OR[facility];  Service: Urology;  Laterality: Right;   CYSTOSCOPY/URETEROSCOPY/HOLMIUM LASER/STENT PLACEMENT Right 05/27/2019   Procedure: CYSTOSCOPY/URETEROSCOPY/HOLMIUM LASER/STENT PLACEMENT;  Surgeon: Billey Co, MD;  Location: [facility];  Service: Urology;  Laterality: Right;   CYSTOSCOPY/URETEROSCOPY/HOLMIUM LASER/STENT PLACEMENT Right 06/17/2019   Procedure: CYSTOSCOPY/URETEROSCOPY/HOLMIUM LASER/STENT Exchange;  Surgeon: Billey Co, MD;  Location: [facility];  Service: Urology;  Laterality: Right;   CYSTOSCOPY/URETEROSCOPY/HOLMIUM LASER/STENT PLACEMENT Right 07/11/2019   Procedure: CYSTOSCOPY/URETEROSCOPY/HOLMIUM LASER/STENT Exchange;  Surgeon: Billey Co, MD;  Location: [facility];  Service: Urology;  Laterality: Right;   STONE EXTRACTION WITH BASKET Right 07/11/2019   Procedure: STONE EXTRACTION WITH BASKET;  Surgeon: Billey Co, MD;  Location: [facility];  Service: Urology;  Laterality: Right;   TEE WITHOUT CARDIOVERSION N/A 04/21/2019   Procedure: TRANSESOPHAGEAL ECHOCARDIOGRAM (TEE);  Surgeon: Minna Merritts, MD;  Location: [facility];  Service: Cardiovascular;  Laterality: N/A;   TEE WITHOUT CARDIOVERSION N/A 07/04/2019   Procedure: TRANSESOPHAGEAL ECHOCARDIOGRAM (TEE);  Surgeon: Wellington Hampshire, MD;  Location: [facility];  Service: Cardiovascular;  Laterality: N/A;   TEE WITHOUT CARDIOVERSION N/A 08/17/2019   Procedure: TRANSESOPHAGEAL ECHOCARDIOGRAM (TEE);  Surgeon: Kate Sable, MD;  Location: [facility];  Service: Cardiovascular;  Laterality: N/A;   TEE WITHOUT CARDIOVERSION N/A 11/15/2019   Procedure: TRANSESOPHAGEAL ECHOCARDIOGRAM (TEE);  Surgeon: Nelva Bush, MD;  Location: [facility];  Service: Cardiovascular;  Laterality: N/A;   TONSILLECTOMY      Current Meds  Medication Sig   amiodarone (PACERONE) 200 MG tablet Take 1 tablet by mouth once daily   aspirin EC 81 MG tablet Take 81 mg by mouth daily. Swallow  whole.   atorvastatin (LIPITOR) 80 MG tablet Take 1 tablet (80 mg total) by mouth every evening.   furosemide (LASIX) 40 MG tablet Take 1 tablet (40 mg total) by mouth daily.   insulin NPH-regular Human (70-30) 100 UNIT/ML injection Inject 120 Units into the skin 2 (two) times daily.   isosorbide mononitrate (IMDUR) 30 MG 24 hr tablet Take 0.5 tablets (15 mg total) by mouth daily.   loratadine (CLARITIN) 10 MG tablet Take 10 mg by mouth daily.   Magnesium Oxide 400 MG CAPS Take 1 capsule (400 mg total) by mouth daily.   meclizine (ANTIVERT) 25 MG tablet Take 1 tablet (25 mg total) by mouth 3 (three) times daily as needed for dizziness.   metoprolol succinate (TOPROL-XL) 25 MG 24 hr tablet Take 25 mg by mouth 2 (two) times daily.   nitroGLYCERIN (NITROSTAT) 0.4 MG SL tablet Place 0.4 mg under the tongue every 5 (five) minutes x 3 doses as needed for chest pain.    ranolazine (RANEXA) 500 MG 12 hr tablet Take 1 tablet (500 mg total) by mouth 2 (two) times daily.   tamsulosin (FLOMAX) 0.4 MG CAPS capsule Take 0.4 mg by mouth daily.    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  --------------------------------------------------------------------------------------------------  Physical Exam: BP (!) 114/50 (BP Location: Right Arm, Patient Position: Sitting, Cuff Size: Large)   Pulse 72   Ht _0  (1.753 m)   Wt 265 lb (120.2 kg)   SpO2 96%   BMI 39.13 kg/m   General:  NAD. Neck: No JVD or HJR. Lungs: Coarse breath sounds bilaterally without wheezes or crackles. Heart: Distant heart sounds.  Regular rate and  rhythm with 1/6 systolic murmur.  No rubs or gallops. Abdomen: Soft, nontender, nondistended. Extremities: Trace pretibial edema bilaterally.  EKG: Normal sinus rhythm with first-degree AV block, left axis deviation, borderline LVH, septal infarct, nonspecific ST/T changes, and mild QT prolongation.  Compared with prior tracing from 05/08/2022, PR interval has increased slightly.  Nonspecific ST/T changes are now present.  Lab Results  Component Value Date   WBC 5.2 05/02/2022   HGB 9.5 (L) 05/02/2022   HCT 29.8 (L) 05/02/2022   MCV 107.6 (H) 05/02/2022   PLT 157 05/02/2022    Lab Results  Component Value Date   NA 137 05/02/2022   K 3.6 05/02/2022   CL 106 05/02/2022   CO2 25 05/02/2022   BUN 12 05/02/2022   CREATININE 0.95 05/02/2022   GLUCOSE 198 (H) 05/02/2022   ALT 33 11/01/2021    Lab Results  Component Value Date   CHOL 92 06/03/2021   HDL 16 (L) 06/03/2021   LDLCALC 49 06/03/2021   TRIG 134 06/03/2021   CHOLHDL 5.8 06/03/2021    --------------------------------------------------------------------------------------------------  ASSESSMENT AND PLAN: Coronary artery disease with accelerating angina: Jonathan Cross continues to have chest pain and exertional dyspnea with minimal activity.  His chest pain is somewhat positional.  However, his profound exertional dyspnea and vague chest discomfort could certainly be an anginal equivalent.  This is confounded by his anemia in the setting of newly diagnosed MDS.  MPI a year ago was low risk.  However, given his progressive symptoms, we discussed close surveillance and treatment of his MDS versus cardiac catheterization.  Jonathan is certainly at increased risk for complications with catheterization, which would only be diagnostic unless there is a critical lesion that necessitated urgent intervention.  Jonathan wishes to move forward with catheterization as soon as possible.  We will not make any medication changes today; I am reluctant to  escalate ranolazine further given his mildly prolonged QT.  Soft blood pressure also precludes escalation of isosorbide mononitrate and metoprolol.  I will have Jonathan Cross restart low-dose aspirin.  Chronic HFpEF: Jonathan Cross appears grossly euvolemic but remains quite symptomatic with NYHA class III symptoms (anemia/MDS likely playing a large role as well).  We will continue current dose of furosemide and proceed with right heart catheterization/coronary angiography next week.  Paroxysmal atrial fibrillation: Only brief palpitations noted over the last month.  No symptoms to suggest prolonged period of atrial fibrillation reported by Jonathan Cross.  Apixaban is currently on hold, which I think is reasonable in the setting of his symptomatic anemia and MDS.  Jonathan has been quite symptomatic in the past with atrial fibrillation, so I suspect his burden is fairly low.  We will continue  amiodarone.  Aortic stenosis status post bioprosthetic aortic valve: Recent echo showed appropriate function of his bioprosthetic aortic valve, which has been complicated by endocarditis in the past.  We will plan to do a right heart catheterization with upcoming coronary angiography, though I will avoid crossing the aortic valve given history of endocarditis.  Myelodysplastic syndrome: Recently diagnosed in the setting of pancytopenia with symptomatic anemia.  I would recommend trying to maintain a hemoglobin above 8.  Jonathan Cross is scheduled for consultation with Dr. Tasia Catchings on Monday.  Shared Decision Making/Informed Consent The risks [stroke (1 in 1000), death (1 in 1000), kidney failure [usually temporary] (1 in 500), bleeding (1 in 200), allergic reaction [possibly serious] (1 in 200)], benefits (diagnostic support and management of coronary artery disease) and alternatives of a cardiac catheterization were discussed in detail with Jonathan Cross and Jonathan is willing to proceed.  Follow-up: Return to clinic in 2-3  weeks.  Nelva Bush, MD 06/12/2022 12:15 PM

## 2022-06-16 ENCOUNTER — Inpatient Hospital Stay: Payer: Medicare HMO

## 2022-06-16 ENCOUNTER — Encounter: Payer: Self-pay | Admitting: Oncology

## 2022-06-16 ENCOUNTER — Inpatient Hospital Stay: Payer: Medicare HMO | Attending: Oncology | Admitting: Oncology

## 2022-06-16 VITALS — BP 115/59 | HR 66 | Temp 97.2°F | Resp 20 | Ht 69.0 in | Wt 266.0 lb

## 2022-06-16 DIAGNOSIS — Z79899 Other long term (current) drug therapy: Secondary | ICD-10-CM | POA: Diagnosis not present

## 2022-06-16 DIAGNOSIS — I110 Hypertensive heart disease with heart failure: Secondary | ICD-10-CM | POA: Diagnosis not present

## 2022-06-16 DIAGNOSIS — D469 Myelodysplastic syndrome, unspecified: Secondary | ICD-10-CM | POA: Insufficient documentation

## 2022-06-16 DIAGNOSIS — E119 Type 2 diabetes mellitus without complications: Secondary | ICD-10-CM | POA: Insufficient documentation

## 2022-06-16 DIAGNOSIS — G4730 Sleep apnea, unspecified: Secondary | ICD-10-CM | POA: Diagnosis not present

## 2022-06-16 DIAGNOSIS — I5032 Chronic diastolic (congestive) heart failure: Secondary | ICD-10-CM | POA: Diagnosis not present

## 2022-06-16 DIAGNOSIS — I200 Unstable angina: Secondary | ICD-10-CM

## 2022-06-16 DIAGNOSIS — Z7189 Other specified counseling: Secondary | ICD-10-CM | POA: Insufficient documentation

## 2022-06-16 DIAGNOSIS — Z794 Long term (current) use of insulin: Secondary | ICD-10-CM | POA: Insufficient documentation

## 2022-06-16 DIAGNOSIS — R5383 Other fatigue: Secondary | ICD-10-CM | POA: Diagnosis not present

## 2022-06-16 DIAGNOSIS — E785 Hyperlipidemia, unspecified: Secondary | ICD-10-CM | POA: Diagnosis not present

## 2022-06-16 DIAGNOSIS — Z7982 Long term (current) use of aspirin: Secondary | ICD-10-CM | POA: Insufficient documentation

## 2022-06-16 DIAGNOSIS — Z87442 Personal history of urinary calculi: Secondary | ICD-10-CM | POA: Insufficient documentation

## 2022-06-16 DIAGNOSIS — I480 Paroxysmal atrial fibrillation: Secondary | ICD-10-CM | POA: Diagnosis not present

## 2022-06-16 DIAGNOSIS — I25110 Atherosclerotic heart disease of native coronary artery with unstable angina pectoris: Secondary | ICD-10-CM | POA: Diagnosis not present

## 2022-06-16 LAB — CBC WITH DIFFERENTIAL/PLATELET
Abs Immature Granulocytes: 0.02 10*3/uL (ref 0.00–0.07)
Basophils Absolute: 0 10*3/uL (ref 0.0–0.1)
Basophils Relative: 1 %
Eosinophils Absolute: 0.1 10*3/uL (ref 0.0–0.5)
Eosinophils Relative: 2 %
HCT: 33.3 % — ABNORMAL LOW (ref 39.0–52.0)
Hemoglobin: 10.9 g/dL — ABNORMAL LOW (ref 13.0–17.0)
Immature Granulocytes: 1 %
Lymphocytes Relative: 38 %
Lymphs Abs: 1.3 10*3/uL (ref 0.7–4.0)
MCH: 36.7 pg — ABNORMAL HIGH (ref 26.0–34.0)
MCHC: 32.7 g/dL (ref 30.0–36.0)
MCV: 112.1 fL — ABNORMAL HIGH (ref 80.0–100.0)
Monocytes Absolute: 0.5 10*3/uL (ref 0.1–1.0)
Monocytes Relative: 15 %
Neutro Abs: 1.5 10*3/uL — ABNORMAL LOW (ref 1.7–7.7)
Neutrophils Relative %: 43 %
Platelets: 131 10*3/uL — ABNORMAL LOW (ref 150–400)
RBC: 2.97 MIL/uL — ABNORMAL LOW (ref 4.22–5.81)
RDW: 17.6 % — ABNORMAL HIGH (ref 11.5–15.5)
WBC: 3.4 10*3/uL — ABNORMAL LOW (ref 4.0–10.5)
nRBC: 0 % (ref 0.0–0.2)

## 2022-06-16 LAB — SAMPLE TO BLOOD BANK

## 2022-06-16 MED ORDER — VITAMIN B-12 500 MCG PO TABS: 500.0000 ug | ORAL_TABLET | Freq: Every day | ORAL | 0 refills | Status: DC

## 2022-06-16 NOTE — Progress Notes (Unsigned)
Hematology/Oncology Consult Note [phone] [phone]     REFERRING PROVIDER: Katina Dung, [place]   Patient Care Team: System, Provider Not In as PCP - General End, Harrell Gave, MD as PCP - Cardiology (Cardiology)  CHIEF COMPLAINTS/PURPOSE OF CONSULTATION:  MDS  HISTORY OF PRESENTING ILLNESS:  Jonathan Cross. 76 y.o. male presents to establish care for MDS I have reviewed his chart and materials related to his cancer extensively and collaborated history with the patient. 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    06/12/2022 Initial Diagnosis   Myelodysplastic syndrome [facility])  Patient follows up with [facility] Dr. Katina Dung.  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 See above.  Using the IPSS-M, he has low risk disease with a median leukemia free survival of about 6 years.  There is concern of MDS may add to his possible angina.  Dr. Katina Dung recommends treatment  1) IF he has evidence of cardiac ischemia that has been maximally medically managed AND a hemoglobin < 9 g/dL 2 ) If his hemoglobin was < 8.0   Darbepoietin/GCSF per Rozanna Boer et al Lelon Frohlich Hem 2013 May; 92(5) Jerelyn Scott 2013 May; 92(5)  https://link.springer.com/article/10.1007%[email or web address])  Ferritin: Treat with iron if ferritin is < 50 Treatment  Darbepoetin 500 mcg every 2 weeks CR = Hgb > 11.5 PR = Inc in Hgb of 1.5 If no response in 12 weeks, add G-CSF 300 mcg two times a week to maintain WBC counts between 5 and 10  If no response at 24 weeks, discontinue trial If hemoglobin is > 12, hold darbepoetin until hemoglobin is < 11. Resume darbepoetin at every 3 weeks. Continue to increase interval by 1 week if hemoglobin increases to > 12 again.      His hemoglobin was 8 on 05/01/22, and he received one unit of PRBC transfusion.  He is accompanied by his daughter today.  + chronic fatigue, intermittent chest pain.    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 04/2022 Echo: EF 60-65%, no rwma, GrII DD, nl RV fxn, mildly dil LA, mild MR, nl fxn'ing biopros AoV.   Coronary artery disease    a. 01/2016 CABG x 2: LIMA->LAD, VG->OM; b. 05/2021 MV: small, mild, rev apical lateral and apical inf defects ->subtle ischemia vs artifact, EF 55-65%-->low risk.   Diabetes ([name])    H/O aortic valve replacement    a. 01/2016 s/p bioprosthetic AVR @ [facility] for Severe AS; b. 05/2021 Echo: EF 55-60%, nl fxn of bioprosthetic AVR; c. 04/2022 Echo: EF 60-65%, nl fxn'ing AoV.   History of kidney stones    Hx of CABG    Hyperlipidemia    Hypertension    Myelodysplastic syndrome ([place]) 05/2022   PAF (paroxysmal atrial fibrillation) ([place])  a. CHA2DS2VASc = 5-->eliquis/amio.   Sleep apnea     SURGICAL HISTORY: Past Surgical History:  Procedure Laterality Date   AORTIC VALVE REPLACEMENT  2017   [facility], bioprosthetic   APPENDECTOMY     BONE MARROW BIOPSY     05/2022   CARDIAC VALVE REPLACEMENT     CORONARY ARTERY BYPASS GRAFT  2017   [facility] - LIMA-LAD and SVG-OM   CYSTOSCOPY W/ RETROGRADES Right 05/27/2019    Procedure: CYSTOSCOPY WITH RETROGRADE PYELOGRAM;  Surgeon: Billey Co, MD;  Location: [facility] OR[facility];  Service: Urology;  Laterality: Right;   CYSTOSCOPY W/ RETROGRADES Right 07/11/2019   Procedure: CYSTOSCOPY WITH RETROGRADE PYELOGRAM;  Surgeon: Billey Co, MD;  Location: [facility];  Service: Urology;  Laterality: Right;   CYSTOSCOPY/URETEROSCOPY/HOLMIUM LASER/STENT PLACEMENT Right 05/27/2019   Procedure: CYSTOSCOPY/URETEROSCOPY/HOLMIUM LASER/STENT PLACEMENT;  Surgeon: Billey Co, MD;  Location: [facility];  Service: Urology;  Laterality: Right;   CYSTOSCOPY/URETEROSCOPY/HOLMIUM LASER/STENT PLACEMENT Right 06/17/2019   Procedure: CYSTOSCOPY/URETEROSCOPY/HOLMIUM LASER/STENT Exchange;  Surgeon: Billey Co, MD;  Location: [facility];  Service: Urology;  Laterality: Right;   CYSTOSCOPY/URETEROSCOPY/HOLMIUM LASER/STENT PLACEMENT Right 07/11/2019   Procedure: CYSTOSCOPY/URETEROSCOPY/HOLMIUM LASER/STENT Exchange;  Surgeon: Billey Co, MD;  Location: [facility];  Service: Urology;  Laterality: Right;   STONE EXTRACTION WITH BASKET Right 07/11/2019   Procedure: STONE EXTRACTION WITH BASKET;  Surgeon: Billey Co, MD;  Location: [facility];  Service: Urology;  Laterality: Right;   TEE WITHOUT CARDIOVERSION N/A 04/21/2019   Procedure: TRANSESOPHAGEAL ECHOCARDIOGRAM (TEE);  Surgeon: Minna Merritts, MD;  Location: [facility];  Service: Cardiovascular;  Laterality: N/A;   TEE WITHOUT CARDIOVERSION N/A 07/04/2019   Procedure: TRANSESOPHAGEAL ECHOCARDIOGRAM (TEE);  Surgeon: Wellington Hampshire, MD;  Location: [facility];  Service: Cardiovascular;  Laterality: N/A;   TEE WITHOUT CARDIOVERSION N/A 08/17/2019   Procedure: TRANSESOPHAGEAL ECHOCARDIOGRAM (TEE);  Surgeon: Kate Sable, MD;  Location: [facility];  Service: Cardiovascular;  Laterality: N/A;   TEE WITHOUT CARDIOVERSION N/A 11/15/2019   Procedure: TRANSESOPHAGEAL ECHOCARDIOGRAM (TEE);  Surgeon: Nelva Bush, MD;  Location: [facility]  ORS;  Service: Cardiovascular;  Laterality: N/A;   TONSILLECTOMY      SOCIAL HISTORY: Social History   Socioeconomic History   Marital status: Married    Spouse name: Not on file   Number of children: Not on file   Years of education: Not on file   Highest education level: Not on file  Occupational History   Not on file  Tobacco Use   Smoking status: Former   Smokeless tobacco: Never  Vaping Use   Vaping Use: Never used  Substance and Sexual Activity   Alcohol use: Never   Drug use: Never   Sexual activity: Not Currently  Other Topics Concern   Not on file  Social History Narrative   Not on file   Social Determinants of Health   Financial Resource Strain: Not on file  Food Insecurity: Not on file  Transportation Needs: Not on file  Physical Activity: Not on file  Stress: Not on file  Social Connections: Not on file  Intimate Partner Violence: Not on file    FAMILY HISTORY: Family History  Problem Relation Age of Onset   Heart attack Father 73   Heart attack Sister     ALLERGIES:  is allergic to metformin.  MEDICATIONS:  Current Outpatient Medications  Medication Sig Dispense Refill   amiodarone (PACERONE) 200 MG tablet Take 1 tablet by mouth once daily 90 tablet 0   aspirin  EC 81 MG tablet Take 81 mg by mouth daily. Swallow whole.     atorvastatin (LIPITOR) 80 MG tablet Take 1 tablet (80 mg total) by mouth every evening. 90 tablet 3   furosemide (LASIX) 40 MG tablet Take 1 tablet (40 mg total) by mouth daily. 90 tablet 3   insulin NPH-regular Human (70-30) 100 UNIT/ML injection Inject 120 Units into the skin 2 (two) times daily.     isosorbide mononitrate (IMDUR) 30 MG 24 hr tablet Take 0.5 tablets (15 mg total) by mouth daily. 45 tablet 3   loratadine (CLARITIN) 10 MG tablet Take 10 mg by mouth daily.     Magnesium Oxide 400 MG CAPS Take 1 capsule (400 mg total) by mouth daily. 90 capsule 3   meclizine (ANTIVERT) 25 MG tablet Take 1 tablet (25 mg total) by  mouth 3 (three) times daily as needed for dizziness. 20 tablet 0   metoprolol succinate (TOPROL-XL) 25 MG 24 hr tablet Take 25 mg by mouth 2 (two) times daily.     nitroGLYCERIN (NITROSTAT) 0.4 MG SL tablet Place 0.4 mg under the tongue every 5 (five) minutes x 3 doses as needed for chest pain.      ranolazine (RANEXA) 500 MG 12 hr tablet Take 1 tablet (500 mg total) by mouth 2 (two) times daily. 60 tablet 5   tamsulosin (FLOMAX) 0.4 MG CAPS capsule Take 0.4 mg by mouth daily.     vitamin B-12 (CYANOCOBALAMIN) 500 MCG tablet Take 1 tablet (500 mcg total) by mouth daily. 90 tablet 0   No current facility-administered medications for this visit.    Review of Systems - Oncology   PHYSICAL EXAMINATION: ECOG PERFORMANCE STATUS: {CHL ONC ECOG LN:[id number]}  Vitals:   06/16/22 1026  BP: (!) 115/59  Pulse: 66  Resp: 20  Temp: (!) 97.2 F (36.2 C)  SpO2: 99%   Filed Weights   06/16/22 1026  Weight: 266 lb (120.7 kg)    Physical Exam   LABORATORY DATA:  I have reviewed the data as listed    Latest Ref Rng & Units 06/16/2022   11:31 AM 05/02/2022    4:05 AM 05/01/2022   11:20 AM  CBC  WBC 4.0 - 10.5 K/uL 3.4  5.2  4.9   Hemoglobin 13.0 - 17.0 g/dL 10.9  9.5  8.0   Hematocrit 39.0 - 52.0 % 33.3  29.8  25.0   Platelets 150 - 400 K/uL 131  157  168       Latest Ref Rng & Units 05/02/2022    4:05 AM 05/01/2022   11:20 AM 11/27/2021    3:41 PM  CMP  Glucose 70 - 99 mg/dL 198  111  358   BUN 8 - 23 mg/dL _0 Creatinine 0.61 - 1.24 mg/dL 0.95  1.00  1.07   Sodium 135 - 145 mmol/L 137  136  134   Potassium 3.5 - 5.1 mmol/L 3.6  3.2  4.1   Chloride 98 - 111 mmol/L 106  106  102   CO2 22 - 32 mmol/L _1 Calcium 8.9 - 10.3 mg/dL 7.8  7.9  8.8      RADIOGRAPHIC STUDIES: I have personally reviewed the radiological images as listed and agreed with the findings in the report. No results found.  ASSESSMENT & PLAN:  No problem-specific Assessment & Plan notes  found for this encounter.   Orders Placed This Encounter  Procedures   CBC with Differential/Platelet    Standing Status:   Future    Standing Expiration Date:   06/17/2023   Comprehensive metabolic panel    Standing Status:   Future    Standing Expiration Date:   06/16/2023   CBC with Differential/Platelet    Standing Status:   Future    Standing Expiration Date:   06/17/2023   CBC with Differential/Platelet    Standing Status:   Future    Number of Occurrences:   1    Standing Expiration Date:   06/17/2023   Sample to Blood Bank    Standing Status:   Future    Standing Expiration Date:   06/17/2023   Sample to Blood Bank    Standing Status:   Future    Number of Occurrences:   1    Standing Expiration Date:   06/17/2023      All questions were answered. The patient knows to call the clinic with any problems, questions or concerns. No barriers to learning was detected.  Earlie Server, MD 06/16/2022

## 2022-06-17 ENCOUNTER — Ambulatory Visit
Admission: RE | Admit: 2022-06-17 | Discharge: 2022-06-17 | Disposition: A | Discharge status: Home / Self Care | Payer: Medicare HMO | Attending: Internal Medicine | Admitting: Internal Medicine

## 2022-06-17 ENCOUNTER — Encounter: Payer: Self-pay | Admitting: Internal Medicine

## 2022-06-17 ENCOUNTER — Encounter: Payer: Self-pay | Admitting: Oncology

## 2022-06-17 ENCOUNTER — Encounter
Admission: RE | Disposition: A | Discharge status: Home / Self Care | Payer: Self-pay | Source: Home / Self Care | Attending: Internal Medicine

## 2022-06-17 ENCOUNTER — Other Ambulatory Visit: Payer: Self-pay

## 2022-06-17 DIAGNOSIS — Z794 Long term (current) use of insulin: Secondary | ICD-10-CM | POA: Insufficient documentation

## 2022-06-17 DIAGNOSIS — E785 Hyperlipidemia, unspecified: Secondary | ICD-10-CM | POA: Insufficient documentation

## 2022-06-17 DIAGNOSIS — I110 Hypertensive heart disease with heart failure: Secondary | ICD-10-CM | POA: Diagnosis not present

## 2022-06-17 DIAGNOSIS — I5032 Chronic diastolic (congestive) heart failure: Secondary | ICD-10-CM | POA: Diagnosis not present

## 2022-06-17 DIAGNOSIS — G4730 Sleep apnea, unspecified: Secondary | ICD-10-CM | POA: Insufficient documentation

## 2022-06-17 DIAGNOSIS — Z953 Presence of xenogenic heart valve: Secondary | ICD-10-CM | POA: Insufficient documentation

## 2022-06-17 DIAGNOSIS — E119 Type 2 diabetes mellitus without complications: Secondary | ICD-10-CM | POA: Insufficient documentation

## 2022-06-17 DIAGNOSIS — D469 Myelodysplastic syndrome, unspecified: Secondary | ICD-10-CM | POA: Insufficient documentation

## 2022-06-17 DIAGNOSIS — Z951 Presence of aortocoronary bypass graft: Secondary | ICD-10-CM | POA: Diagnosis not present

## 2022-06-17 DIAGNOSIS — I25110 Atherosclerotic heart disease of native coronary artery with unstable angina pectoris: Secondary | ICD-10-CM | POA: Insufficient documentation

## 2022-06-17 DIAGNOSIS — I200 Unstable angina: Secondary | ICD-10-CM

## 2022-06-17 DIAGNOSIS — Z79899 Other long term (current) drug therapy: Secondary | ICD-10-CM | POA: Diagnosis not present

## 2022-06-17 DIAGNOSIS — R5383 Other fatigue: Secondary | ICD-10-CM | POA: Insufficient documentation

## 2022-06-17 DIAGNOSIS — I480 Paroxysmal atrial fibrillation: Secondary | ICD-10-CM | POA: Insufficient documentation

## 2022-06-17 HISTORY — PX: RIGHT HEART CATH AND CORONARY/GRAFT ANGIOGRAPHY: CATH118265

## 2022-06-17 LAB — POCT I-STAT 7, (LYTES, BLD GAS, ICA,H+H)
Acid-base deficit: 2 mmol/L (ref 0.0–2.0)
Bicarbonate: 22.7 mmol/L (ref 20.0–28.0)
Calcium, Ion: 1.24 mmol/L (ref 1.15–1.40)
HCT: 31 % — ABNORMAL LOW (ref 39.0–52.0)
Hemoglobin: 10.5 g/dL — ABNORMAL LOW (ref 13.0–17.0)
O2 Saturation: 93 %
Potassium: 4.2 mmol/L (ref 3.5–5.1)
Sodium: 136 mmol/L (ref 135–145)
TCO2: 24 mmol/L (ref 22–32)
pCO2 arterial: 36 mmHg (ref 32–48)
pH, Arterial: 7.408 (ref 7.35–7.45)
pO2, Arterial: 65 mmHg — ABNORMAL LOW (ref 83–108)

## 2022-06-17 LAB — POCT I-STAT EG7
Acid-base deficit: 1 mmol/L (ref 0.0–2.0)
Bicarbonate: 24.9 mmol/L (ref 20.0–28.0)
Calcium, Ion: 1.28 mmol/L (ref 1.15–1.40)
HCT: 33 % — ABNORMAL LOW (ref 39.0–52.0)
Hemoglobin: 11.2 g/dL — ABNORMAL LOW (ref 13.0–17.0)
O2 Saturation: 61 %
Potassium: 4.2 mmol/L (ref 3.5–5.1)
Sodium: 137 mmol/L (ref 135–145)
TCO2: 26 mmol/L (ref 22–32)
pCO2, Ven: 43.3 mmHg — ABNORMAL LOW (ref 44–60)
pH, Ven: 7.367 (ref 7.25–7.43)
pO2, Ven: 33 mmHg (ref 32–45)

## 2022-06-17 LAB — GLUCOSE, CAPILLARY
Glucose-Capillary: 189 mg/dL — ABNORMAL HIGH (ref 70–99)
Glucose-Capillary: 189 mg/dL — ABNORMAL HIGH (ref 70–99)

## 2022-06-17 SURGERY — RIGHT HEART CATH AND CORONARY/GRAFT ANGIOGRAPHY
Anesthesia: Moderate Sedation | Laterality: Bilateral

## 2022-06-17 MED ORDER — ASPIRIN 81 MG PO CHEW
81.0000 mg | CHEWABLE_TABLET | ORAL | Status: AC
  Administered 2022-06-17: 81 mg via ORAL

## 2022-06-17 MED ORDER — FUROSEMIDE 40 MG PO TABS: 40.0000 mg | ORAL_TABLET | Freq: Two times a day (BID) | ORAL | 3 refills | Status: DC

## 2022-06-17 MED ORDER — ONDANSETRON HCL 4 MG/2ML IJ SOLN: 4.0000 mg | Freq: Four times a day (QID) | INTRAMUSCULAR | Status: DC | PRN

## 2022-06-17 MED ORDER — LIDOCAINE HCL (PF) 1 % IJ SOLN
INTRAMUSCULAR | Status: DC | PRN
  Administered 2022-06-17 (×2): 2 mL

## 2022-06-17 MED ORDER — HYDRALAZINE HCL 20 MG/ML IJ SOLN: 10.0000 mg | INTRAMUSCULAR | Status: DC | PRN

## 2022-06-17 MED ORDER — SODIUM CHLORIDE 0.9% FLUSH: 3.0000 mL | INTRAVENOUS | Status: DC | PRN

## 2022-06-17 MED ORDER — VERAPAMIL HCL 2.5 MG/ML IV SOLN
INTRAVENOUS | Status: AC
  Filled 2022-06-17: qty 2

## 2022-06-17 MED ORDER — ASPIRIN 81 MG PO CHEW: 81.0000 mg | CHEWABLE_TABLET | ORAL | Status: DC

## 2022-06-17 MED ORDER — ACETAMINOPHEN 325 MG PO TABS: 650.0000 mg | ORAL_TABLET | ORAL | Status: DC | PRN

## 2022-06-17 MED ORDER — ASPIRIN 81 MG PO CHEW
CHEWABLE_TABLET | ORAL | Status: AC
  Filled 2022-06-17: qty 1

## 2022-06-17 MED ORDER — SODIUM CHLORIDE 0.9 % IV SOLN: 250.0000 mL | INTRAVENOUS | Status: DC | PRN

## 2022-06-17 MED ORDER — SODIUM CHLORIDE 0.9% FLUSH: 3.0000 mL | Freq: Two times a day (BID) | INTRAVENOUS | Status: DC

## 2022-06-17 MED ORDER — HEPARIN (PORCINE) IN NACL 1000-0.9 UT/500ML-% IV SOLN
INTRAVENOUS | Status: AC
  Filled 2022-06-17: qty 1000

## 2022-06-17 MED ORDER — VERAPAMIL HCL 2.5 MG/ML IV SOLN
INTRAVENOUS | Status: DC | PRN
  Administered 2022-06-17 (×2): 2.5 mg via INTRA_ARTERIAL

## 2022-06-17 MED ORDER — MIDAZOLAM HCL 2 MG/2ML IJ SOLN
INTRAMUSCULAR | Status: DC | PRN
  Administered 2022-06-17: .5 mg via INTRAVENOUS

## 2022-06-17 MED ORDER — LABETALOL HCL 5 MG/ML IV SOLN: 10.0000 mg | INTRAVENOUS | Status: DC | PRN

## 2022-06-17 MED ORDER — HEPARIN (PORCINE) IN NACL 1000-0.9 UT/500ML-% IV SOLN
INTRAVENOUS | Status: DC | PRN
  Administered 2022-06-17 (×2): 500 mL

## 2022-06-17 MED ORDER — HEPARIN SODIUM (PORCINE) 1000 UNIT/ML IJ SOLN
INTRAMUSCULAR | Status: DC | PRN
  Administered 2022-06-17: 5000 [IU] via INTRAVENOUS

## 2022-06-17 MED ORDER — FENTANYL CITRATE (PF) 100 MCG/2ML IJ SOLN
INTRAMUSCULAR | Status: DC | PRN
  Administered 2022-06-17: 12.5 ug via INTRAVENOUS

## 2022-06-17 MED ORDER — IOHEXOL 300 MG/ML  SOLN
INTRAMUSCULAR | Status: DC | PRN
  Administered 2022-06-17: 56 mL

## 2022-06-17 MED ORDER — HEPARIN SODIUM (PORCINE) 1000 UNIT/ML IJ SOLN
INTRAMUSCULAR | Status: AC
  Filled 2022-06-17: qty 10

## 2022-06-17 MED ORDER — MIDAZOLAM HCL 2 MG/2ML IJ SOLN
INTRAMUSCULAR | Status: AC
  Filled 2022-06-17: qty 2

## 2022-06-17 MED ORDER — LIDOCAINE HCL 1 % IJ SOLN
INTRAMUSCULAR | Status: AC
  Filled 2022-06-17: qty 20

## 2022-06-17 MED ORDER — SODIUM CHLORIDE 0.9 % IV SOLN: INTRAVENOUS | Status: DC

## 2022-06-17 MED ORDER — FENTANYL CITRATE (PF) 100 MCG/2ML IJ SOLN
INTRAMUSCULAR | Status: AC
  Filled 2022-06-17: qty 2

## 2022-06-17 SURGICAL SUPPLY — 14 items
BAND ZEPHYR COMPRESS 30 LONG (HEMOSTASIS) IMPLANT
CATH INFINITI 5 FR IM (CATHETERS) IMPLANT
CATH INFINITI 5FR MULTPACK ANG (CATHETERS) IMPLANT
CATH SWAN GANZ 7F STRAIGHT (CATHETERS) IMPLANT
DRAPE BRACHIAL (DRAPES) IMPLANT
GLIDESHEATH SLEND SS 6F .021 (SHEATH) IMPLANT
GLIDESHEATH SLENDER 7FR .021G (SHEATH) IMPLANT
GUIDEWIRE INQWIRE 1.5J.035X260 (WIRE) IMPLANT
INQWIRE 1.5J .035X260CM (WIRE) ×1
PACK CARDIAC CATH (CUSTOM PROCEDURE TRAY) ×1 IMPLANT
PROTECTION STATION PRESSURIZED (MISCELLANEOUS) ×1
SET ATX SIMPLICITY (MISCELLANEOUS) IMPLANT
STATION PROTECTION PRESSURIZED (MISCELLANEOUS) IMPLANT
WIRE HITORQ VERSACORE ST 145CM (WIRE) IMPLANT

## 2022-06-17 NOTE — Assessment & Plan Note (Signed)
Discussed with patient. Palliative intent.  

## 2022-06-17 NOTE — Assessment & Plan Note (Addendum)
ZRSR2 mutation.MDS-IB-1 [facility] Dr. Lucianne Lei Deventer's notes were reviewed.  I agree with initiating EPO therapy if his hemoglobin is <=8 or <=9 with evidence of myocardium ischemia.  Repeat cbc today showed a hemoglobin of 10.9, no need to initiate therapy for now.  Continue close monitor, repeat cbc in 2 weeks.  05/01/2022 ferritin is 366, at goal.

## 2022-06-17 NOTE — Interval H&P Note (Signed)
History and Physical Interval Note:  06/17/2022 1:40 PM  Jonathan Cross.  has presented today for surgery, with the diagnosis of accelerating angina and chronic HFpEF.  The various methods of treatment have been discussed with the patient and family. After consideration of risks, benefits and other options for treatment, the patient has consented to  Procedure(s): RIGHT/LEFT HEART CATH AND CORONARY ANGIOGRAPHY (Bilateral) as a surgical intervention.  The patient's history has been reviewed, patient examined, no change in status, stable for surgery.  I have reviewed the patient's chart and labs.  Questions were answered to the patient's satisfaction.    Cath Lab Visit (complete for each Cath Lab visit)  Clinical Evaluation Leading to the Procedure:   ACS: No.  Non-ACS:    Anginal Classification: CCS IV  Anti-ischemic medical therapy: Minimal Therapy (1 class of medications)  Non-Invasive Test Results: Low-risk stress test findings: cardiac mortality <1%/year  Prior CABG: Previous CABG  Jonathan Cross

## 2022-06-17 NOTE — Assessment & Plan Note (Signed)
Multifactorial, due to his medical problems. Out of proportion to his current hemoglobin level.  B12 is wnl, <400, recommend oral B12 536mg daily.  Normal TSH  Check vitamin D level

## 2022-06-17 NOTE — Brief Op Note (Signed)
BRIEF CARDIAC CATHETERIZATION NOTE  06/17/2022  3:33 PM  PATIENT:  Jonathan Cross.  76 y.o. male  PRE-OPERATIVE DIAGNOSIS:  Accelerating angina and chronic HFpEF  POST-OPERATIVE DIAGNOSIS:  Same  PROCEDURE:  Procedure(s): RIGHT/LEFT HEART CATH AND CORONARY ANGIOGRAPHY (Bilateral)  SURGEON:  Surgeon(s) and Role:    * Woodruff Skirvin, MD - Primary  FINDINGS: Severe multivessel CAD including 80% ostial LAD, subtotal occlusion of LCx, and moderate RCA disease with 80% lesion in rPL branch. Widely patent LIMA-LAD and SVG-OM. Moderately elevated left and right heart filling pressures. Low normal cardiac output/index.  RECOMMENDATIONS: Escalate diuresis. Secondary prevention of CAD.  Nelva Bush, MD [facility]

## 2022-06-17 NOTE — Assessment & Plan Note (Addendum)
CHF, Afib.  Echo showed preserved LVEF with normal bioprosthetic aortic valve function  He has been recently evaluated by cardiology Dr. Saunders Revel.  He will have cardiac craterization.

## 2022-06-18 ENCOUNTER — Encounter: Payer: Self-pay | Admitting: Internal Medicine

## 2022-06-23 MED ORDER — FUROSEMIDE 40 MG PO TABS: 40.0000 mg | ORAL_TABLET | Freq: Two times a day (BID) | ORAL | 3 refills | Status: DC

## 2022-06-25 NOTE — Progress Notes (Signed)
Cardiology Clinic Note   Patient Name: Jonathan Cross. Date of Encounter: 06/27/2022  Primary Care Provider:  Atilano Ina, MD Primary Cardiologist:  Nelva Bush, MD  Patient Profile    76 year old male with a history of CAD status post CABG x 2, severe stenosis status post bioprosthetic and aortic valve replacement paroxysmal atrial fibrillation, hypertension, hyperlipidemia, HFpEF, type 2 diabetes, sleep apnea presents today for follow-up.  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 04/2022 Echo: EF 60-65%, no rwma, GrII DD, nl RV fxn, mildly dil LA, mild MR, nl fxn'ing biopros AoV.   Coronary artery disease    a. 01/2016 CABG x 2: LIMA->LAD, VG->OM; b. 05/2021 MV: small, mild, rev apical lateral and apical inf defects ->subtle ischemia vs artifact, EF 55-65%-->low risk.   Diabetes ([place])    H/O aortic valve replacement    a. 01/2016 s/p bioprosthetic AVR @ [facility] for Severe AS; b. 05/2021 Echo: EF 55-60%, nl fxn of bioprosthetic AVR; c. 04/2022 Echo: EF 60-65%, nl fxn'ing AoV.   History of kidney stones    Hx of CABG    Hyperlipidemia    Hypertension    MDS (myelodysplastic syndrome), low grade ([place]) 04/2022   Myelodysplastic syndrome ([place]) 05/2022   PAF (paroxysmal atrial fibrillation) ([facility])    a. CHA2DS2VASc = 5-->eliquis/amio.   Sleep apnea    Past Surgical History:  Procedure Laterality Date   AORTIC VALVE REPLACEMENT  2017   [facility], bioprosthetic   APPENDECTOMY     BONE MARROW BIOPSY     05/2022   CARDIAC VALVE REPLACEMENT     CORONARY ARTERY BYPASS GRAFT  2017   [facility] - LIMA-LAD and SVG-OM   CYSTOSCOPY W/ RETROGRADES Right 05/27/2019   Procedure: CYSTOSCOPY WITH RETROGRADE PYELOGRAM;  Surgeon: Billey Co, MD;  Location: [facility];  Service: Urology;  Laterality: Right;   CYSTOSCOPY W/ RETROGRADES Right 07/11/2019    Procedure: CYSTOSCOPY WITH RETROGRADE PYELOGRAM;  Surgeon: Billey Co, MD;  Location: [facility];  Service: Urology;  Laterality: Right;   CYSTOSCOPY/URETEROSCOPY/HOLMIUM LASER/STENT PLACEMENT Right 05/27/2019   Procedure: CYSTOSCOPY/URETEROSCOPY/HOLMIUM LASER/STENT PLACEMENT;  Surgeon: Billey Co, MD;  Location: [facility];  Service: Urology;  Laterality: Right;   CYSTOSCOPY/URETEROSCOPY/HOLMIUM LASER/STENT PLACEMENT Right 06/17/2019   Procedure: CYSTOSCOPY/URETEROSCOPY/HOLMIUM LASER/STENT Exchange;  Surgeon: Billey Co, MD;  Location: [facility];  Service: Urology;  Laterality: Right;   CYSTOSCOPY/URETEROSCOPY/HOLMIUM LASER/STENT PLACEMENT Right 07/11/2019   Procedure: CYSTOSCOPY/URETEROSCOPY/HOLMIUM LASER/STENT Exchange;  Surgeon: Billey Co, MD;  Location: [facility];  Service: Urology;  Laterality: Right;   RIGHT HEART CATH AND CORONARY/GRAFT ANGIOGRAPHY Bilateral 06/17/2022   Procedure: RIGHT HEART CATH AND CORONARY/GRAFT ANGIOGRAPHY;  Surgeon: Nelva Bush, MD;  Location: [place] [facility];  Service: Cardiovascular;  Laterality: Bilateral;   STONE EXTRACTION WITH BASKET Right 07/11/2019   Procedure: STONE EXTRACTION WITH BASKET;  Surgeon: Billey Co, MD;  Location: [facility];  Service: Urology;  Laterality: Right;   TEE WITHOUT CARDIOVERSION N/A 04/21/2019   Procedure: TRANSESOPHAGEAL ECHOCARDIOGRAM (TEE);  Surgeon: Minna Merritts, MD;  Location: [facility];  Service: Cardiovascular;  Laterality: N/A;   TEE WITHOUT CARDIOVERSION N/A 07/04/2019   Procedure: TRANSESOPHAGEAL ECHOCARDIOGRAM (TEE);  Surgeon: Wellington Hampshire, MD;  Location: [facility];  Service: Cardiovascular;  Laterality: N/A;   TEE WITHOUT CARDIOVERSION N/A 08/17/2019   Procedure:  TRANSESOPHAGEAL ECHOCARDIOGRAM (TEE);  Surgeon: Kate Sable, MD;  Location: [facility];  Service: Cardiovascular;  Laterality: N/A;   TEE WITHOUT CARDIOVERSION N/A 11/15/2019   Procedure: TRANSESOPHAGEAL ECHOCARDIOGRAM  (TEE);  Surgeon: Nelva Bush, MD;  Location: [facility];  Service: Cardiovascular;  Laterality: N/A;   TONSILLECTOMY      Allergies  Allergies  Allergen Reactions   Metformin Diarrhea    Loose stools even with XR    History of Present Illness    Jonathan Cross. is a 76 year old male with previously past medical history of coronary artery disease status post CABG x 2 (LIMA to the LAD, SVG to the obtuse marginal), severe aortic stenosis status post bioprosthetic aortic valve replacement complicated by endocarditis, paroxysmal atrial fibrillation, hypertension, hyperlipidemia, HFpEF, type 2 diabetes, sleep apnea, and BPH.  He was previously followed by [facility] in the setting of severe aortic stenosis, underwent bypass prosthetic aortic valve replacement in 2 coronary artery bypass grafting in July 2017.  Between October 20 01 Dec 2019, he had multiple admissions related to fever, malaise, and Enterococcus bacteremia.  TEE echocardiogram in October 2020 and December 2020 were negative for vegetation.  In February 2021 he was found to have a mobile mass on his prosthetic aortic valve and was seen by ID and treated with appropriate antibiotics for 6 weeks in the outpatient setting.  Prior to PICC removal he had a repeat TEE in May 2021 which continues to show an echodensity that appeared less mobile but larger measuring 1.1 x 0.6 cm.  Perivalvular abscess cannot be excluded at that time.  He was transferred to [facility] where cardiac CT showed evidence of a small mass in the leaflet of the noncoronary cusp, likely representing a healed vegetation.  Leaflets were freely movable without evidence of obstruction.  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 Both grafts were visualized and were patent.  CT surgery evaluated and recommended continued observation and antibiotic therapy.  November 2022 was admitted with dyspnea and  chest fullness and found to be in rapid atrial fibrillation he converted to sinus rhythm with IV amiodarone.  He underwent stress testing in the setting of chest pain which showed a small mild reversible apical lateral and anterior inferior defect with question of subtle ischemia versus infarct.  Ultimately it was felt to be low risk study and he was medically managed.  In April 2023 he was evaluated for progressive dyspnea and a 30 pound weight gain.  He was referred to the emergency department workup was unremarkable and he was subsequently discharged home.  He has had multiple emergency department visits between [facility] and [facility] for chest pain.  Hospitalization [place] and October reveal high-sensitivity troponin slightly elevated but flat in the setting of anemia with a hemoglobin of 8.  He was transfused 1 unit of blood.  Echocardiogram revealed preserved LVEF with normal bioprosthetic aortic valve function.  Home apixaban was held with plans for outpatient hematology follow-up.  He then presented to the [facility] emergency department in late October with recurrent persistent chest pain.  Troponins were normal.  Hemoglobin had improved to 10.  He continued to have 5/10 chest pain and his most recent office visit.  Cardiac catheterization was discussed but ultimately deferred pending further workup of anemia.  He had previously been diagnosed with myelodysplastic syndrome.  He was last seen in clinic on 06/12/2022 and continued not to feel well.  He has marked fatigue as well as continued intermittent chest discomfort.  He had had minimal palpitations but thinks he may have had a bout of atrial fibrillation.  He currently remained off of the apixaban and aspirin due to concerns for his pancytopenia.  They continue to discuss treatment of MDS versus cardiac catheterization and with the progression of his symptoms he wished to continue to move forward with the catheterization as soon as possible.  Right and left heart  catheterization was completed on 06/17/2022 which revealed significant multivessel coronary artery disease including 80% ostial LAD followed by sequential 50-60% mid LAD lesions, 50% proximal proximal left circumflex disease, and 70-80% lesion of the proximal segment meant of the RPL 1.  Widely patent LIMA-LAD and SVG-OM 2, moderately elevated left heart filling pressures, mildly elevated right heart and pulmonary artery pressures, normal cardiac output/index.  Diuresis was escalated with repeat blood work on return to clinic as well as secondary prevention of coronary artery disease and continue antianginal therapy.  He returns to clinic today accompanied by his daughter stating that he is still continued with fatigue and shortness of breath.  He states that he has had chest discomfort since his valve replacement that is unchanged.  He is also continue to follow-up with oncology for his MDS.  Prior to the initiation of further treatment they were concerned and wanted to make sure that he had stable coronary artery disease.  He has previously undergone right and left heart catheterization as stated above without incident.  Home Medications    Current Outpatient Medications  Medication Sig Dispense Refill   amiodarone (PACERONE) 200 MG tablet Take 1 tablet by mouth once daily 90 tablet 0   apixaban (ELIQUIS) 5 MG TABS tablet Take 5 mg by mouth 2 (two) times daily.     aspirin EC 81 MG tablet Take 81 mg by mouth daily. Swallow whole.     atorvastatin (LIPITOR) 80 MG tablet Take 1 tablet (80 mg total) by mouth every evening. 90 tablet 3   furosemide (LASIX) 40 MG tablet Take 1 tablet (40 mg total) by mouth 2 (two) times daily. 180 tablet 3   insulin NPH-regular Human (70-30) 100 UNIT/ML injection Inject 120 Units into the skin 2 (two) times daily.     isosorbide mononitrate (IMDUR) 30 MG 24 hr tablet Take 0.5 tablets (15 mg total) by mouth daily. 45 tablet 3   loratadine (CLARITIN) 10 MG tablet Take 10 mg  by mouth daily.     Magnesium Oxide 400 MG CAPS Take 1 capsule (400 mg total) by mouth daily. 90 capsule 3   meclizine (ANTIVERT) 25 MG tablet Take 1 tablet (25 mg total) by mouth 3 (three) times daily as needed for dizziness. 20 tablet 0   metoprolol succinate (TOPROL-XL) 25 MG 24 hr tablet Take 25 mg by mouth 2 (two) times daily.     nitroGLYCERIN (NITROSTAT) 0.4 MG SL tablet Place 0.4 mg under the tongue every 5 (five) minutes x 3 doses as needed for chest pain.      ranolazine (RANEXA) 500 MG 12 hr tablet Take 1 tablet (500 mg total) by mouth 2 (two) times daily. 60 tablet 5   tamsulosin (FLOMAX) 0.4 MG CAPS capsule Take 0.4 mg by mouth daily.     vitamin B-12 (CYANOCOBALAMIN) 500 MCG tablet Take 1 tablet (500 mcg total) by mouth daily. 90 tablet 0   No current facility-administered medications for this visit.     Family History  Family History  Problem Relation Age of Onset   Heart attack Father 53   Heart attack Sister    He indicated that his mother is deceased. He indicated that his father is deceased. He indicated that two of his three sisters are alive.  Social History    Social History   Socioeconomic History   Marital status: Married    Spouse name: Not on file   Number of children: Not on file   Years of education: Not on file   Highest education level: Not on file  Occupational History   Not on file  Tobacco Use   Smoking status: Former   Smokeless tobacco: Never  Vaping Use   Vaping Use: Never used  Substance and Sexual Activity   Alcohol use: Never   Drug use: Never   Sexual activity: Not Currently  Other Topics Concern   Not on file  Social History Narrative   Not on file   Social Determinants of Health   Financial Resource Strain: Not on file  Food Insecurity: Not on file  Transportation Needs: Not on file  Physical Activity: Not on file  Stress: Not on file  Social Connections: Not on file  Intimate Partner Violence: Not on file      Review of Systems    General:  No chills, fever, night sweats or weight changes.  Endorses fatigue Cardiovascular:  No chest pain, endorses dyspnea on exertion, edema, orthopnea, but denies palpitations, paroxysmal nocturnal dyspnea. Dermatological: No rash, lesions/masses Respiratory: No cough, endorses dyspnea Urologic: No hematuria, dysuria Abdominal:   No nausea, vomiting, diarrhea, bright red blood per rectum, melena, or hematemesis Neurologic:  No visual changes, wkns, changes in mental status. All other systems reviewed and are otherwise negative except as noted above.   Physical Exam    VS:  BP 116/65 (BP Location: Left Arm, Patient Position: Sitting, Cuff Size: Large)   Pulse 73   Ht _0  (1.753 m)   Wt 264 lb 6.4 oz (119.9 kg)   SpO2 94%   BMI 39.05 kg/m  , BMI Body mass index is 39.05 kg/m.     GEN: Well nourished, well developed, in no acute distress. HEENT: normal. Neck: Supple, no JVD, carotid bruits, or masses. Cardiac: Distant heart sounds RRR, I/VI murmur, without rubs, or gallops. No clubbing, cyanosis, trace pretibial edema.  Radials 2+/PT 2+ and equal bilaterally.  Respiratory:  Respirations regular and unlabored, coarse to auscultation bilaterally. GI: Soft, nontender, obese, nondistended, BS + x 4. MS: no deformity or atrophy. Skin: warm and dry, no rash. Neuro:  Strength and sensation are intact. Psych: Normal affect.  Accessory Clinical Findings    ECG personally reviewed by me today-sinus rhythm with rate of 73 with first-degree AV block, left anterior fascicular block, LVH with early repolarization- No acute changes  Lab Results  Component Value Date   WBC 3.4 (L) 06/16/2022   HGB 10.5 (L) 06/17/2022   HCT 31.0 (L) 06/17/2022   MCV 112.1 (H) 06/16/2022   PLT 131 (L) 06/16/2022   Lab Results  Component Value Date   CREATININE 1.07 06/26/2022   BUN 21 06/26/2022   NA 134 (L) 06/26/2022   K 4.3 06/26/2022   CL 107 06/26/2022   CO2 24  06/26/2022   Lab Results  Component Value Date   ALT 33 11/01/2021   AST 27 11/01/2021   ALKPHOS 53 11/01/2021   BILITOT 0.5 11/01/2021   Lab Results  Component Value Date  CHOL 92 06/03/2021   HDL 16 (L) 06/03/2021   LDLCALC 49 06/03/2021   TRIG 134 06/03/2021   CHOLHDL 5.8 06/03/2021    Lab Results  Component Value Date   HGBA1C 8.3 (H) 05/02/2022    Assessment & Plan   1.  Coronary artery disease or a continued previously to have complaints of shortness of breath and dyspnea on exertion with minimal activity and occasional chest discomfort.  His chest pain continues to be somewhat positional.  His dyspnea on exertion was thought to be an anginal equivalent so he underwent a right and left heart catheterization on 06/17/2022 which revealed significant multivessel coronary artery disease including 80% ostial LAD disease followed by sequential 50-60% mid LAD lesions, 50% proximal left circumflex disease, and 7080% lesion in the distal segment of the RPL 1.  Widely patent LIMA-LAD and SVG-OM 2.  Moderately elevated left heart filling pressures with his diuresis escalated.  Secondary prevention of coronary artery disease with continued antianginal therapy with ongoing management of his MDS and anemia.  So he is continued on aspirin 81 mg daily, atorvastatin 80 mg daily, Imdur 15 mg daily, and Ranexa 500 mg twice daily.  He is also being sent for BMP today to reevaluate his kidney function after his procedure.  2.  HFpEF where he continues to appear euvolemic but continues with NYHA class III symptoms.  Diuretics were increased after his right and left heart catheterization to furosemide 40 mg daily.  According to the daughter who accompanies him today they just recently picked up his prescription for the furosemide 40 mg so she is to ensure that he is continuing to take the appropriate diuretic dose.  He is continued on Toprol XL 25 mg twice daily.  3.  Paroxysmal atrial fibrillation with  sinus on exam today.  He states he may have had an occasional palpitation but nothing that has been prolonged.  He is continued on amiodarone 200 mg daily, is also been previously restarted back on his apixaban 5 mg twice daily prior to his appointment today for CHA2DS2-VASc score of at least 6.  4.  Aortic stenosis status post bioprosthetic aortic valve with a recent echo showed appropriate function of his valve which had been complicated by previous bacterial endocarditis in the past.  Patient states that he has had issues for the last 6 years since he had his valve replacement completed.  5.  Myelodysplastic syndrome which was recently diagnosed in the setting of pancytopenia with symptomatic anemia.  He continues to follow with oncology. It is  recommended with his cardiac history to keep his hemoglobin greater than equal to 8.  His last CBC on 06/16/2022 revealed a hemoglobin of 10.9.  6.   Essential hypertension blood pressure today 116/65 which is stable.  He does have a history of intermittent soft pressures with associated lightheadedness there were no changes made to his medication regimen today.  7.  Hyperlipidemia with LDL 49 which is at goal levels on 06/03/2021.  He is continued on atorvastatin 80 mg daily.  Will need lipid panel at follow-up.  8.  Type 2 diabetes with an A1c 8.6 06/11/22.  He continues to be followed by his PCP and remains on insulin therapy.  9.  Obstructive sleep apnea where he continues to wear BiPAP at night.  10.  Disposition patient return to clinic to see MD/APP in 6 weeks or sooner if needed.  Keydi Giel, NP 06/27/2022, 9:17 AM   CHA2DS2-VASc Score = 6  This indicates a 9.7% annual risk of stroke. The patient's score is based upon: CHF History: 1 HTN History: 1 Diabetes History: 1 Stroke History: 0 Vascular Disease History: 1 Age Score: 2 Gender Score: 0

## 2022-06-26 ENCOUNTER — Ambulatory Visit: Payer: Medicare HMO | Attending: Cardiology | Admitting: Cardiology

## 2022-06-26 ENCOUNTER — Encounter: Payer: Self-pay | Admitting: Cardiology

## 2022-06-26 ENCOUNTER — Other Ambulatory Visit
Admission: RE | Admit: 2022-06-26 | Discharge: 2022-06-26 | Disposition: A | Discharge status: Home / Self Care | Payer: Medicare HMO | Source: Ambulatory Visit | Attending: Cardiology | Admitting: Cardiology

## 2022-06-26 VITALS — BP 116/65 | HR 73 | Ht 69.0 in | Wt 264.4 lb

## 2022-06-26 DIAGNOSIS — Z952 Presence of prosthetic heart valve: Secondary | ICD-10-CM | POA: Diagnosis not present

## 2022-06-26 DIAGNOSIS — E119 Type 2 diabetes mellitus without complications: Secondary | ICD-10-CM

## 2022-06-26 DIAGNOSIS — D469 Myelodysplastic syndrome, unspecified: Secondary | ICD-10-CM

## 2022-06-26 DIAGNOSIS — E785 Hyperlipidemia, unspecified: Secondary | ICD-10-CM

## 2022-06-26 DIAGNOSIS — I25118 Atherosclerotic heart disease of native coronary artery with other forms of angina pectoris: Secondary | ICD-10-CM

## 2022-06-26 DIAGNOSIS — G4733 Obstructive sleep apnea (adult) (pediatric): Secondary | ICD-10-CM

## 2022-06-26 DIAGNOSIS — I480 Paroxysmal atrial fibrillation: Secondary | ICD-10-CM

## 2022-06-26 DIAGNOSIS — I5032 Chronic diastolic (congestive) heart failure: Secondary | ICD-10-CM

## 2022-06-26 DIAGNOSIS — I10 Essential (primary) hypertension: Secondary | ICD-10-CM

## 2022-06-26 DIAGNOSIS — Z794 Long term (current) use of insulin: Secondary | ICD-10-CM

## 2022-06-26 LAB — BASIC METABOLIC PANEL
Anion gap: 3 — ABNORMAL LOW (ref 5–15)
BUN: 21 mg/dL (ref 8–23)
CO2: 24 mmol/L (ref 22–32)
Calcium: 8.6 mg/dL — ABNORMAL LOW (ref 8.9–10.3)
Chloride: 107 mmol/L (ref 98–111)
Creatinine, Ser: 1.07 mg/dL (ref 0.61–1.24)
GFR, Estimated: >60 mL/min (ref >60)
Glucose, Bld: 259 mg/dL — ABNORMAL HIGH (ref 70–99)
Potassium: 4.3 mmol/L (ref 3.5–5.1)
Sodium: 134 mmol/L — ABNORMAL LOW (ref 135–145)

## 2022-06-26 NOTE — Patient Instructions (Signed)
Medication Instructions:  No changes at this time.   *If you need a refill on your cardiac medications before your next appointment, please call your pharmacy*   Lab Work: [facility] today over at the [facility]. Check in at registration.   If you have labs (blood work) drawn today and your tests are completely normal, you will receive your results only by: [place] (if you have MyChart) OR A paper copy in the mail If you have any lab test that is abnormal or we need to change your treatment, we will call you to review the results.   Testing/Procedures: None   Follow-Up: At [facility], you and your health needs are our priority.  As part of our continuing mission to provide you with exceptional heart care, we have created designated Provider Care Teams.  These Care Teams include your primary Cardiologist (physician) and Advanced Practice Providers (APPs -  Physician Assistants and Nurse Practitioners) who all work together to provide you with the care you need, when you need it.   Your next appointment:   6 week(s)  The format for your next appointment:   In Person  Provider:   Nelva Bush, MD or Gerrie Nordmann, NP      Important Information About Sugar

## 2022-06-27 NOTE — Progress Notes (Signed)
Kidney function remains stable. No changes to medications at this time.

## 2022-06-30 ENCOUNTER — Inpatient Hospital Stay: Payer: Medicare HMO

## 2022-06-30 DIAGNOSIS — R5383 Other fatigue: Secondary | ICD-10-CM

## 2022-06-30 DIAGNOSIS — D469 Myelodysplastic syndrome, unspecified: Secondary | ICD-10-CM | POA: Diagnosis not present

## 2022-06-30 LAB — CBC WITH DIFFERENTIAL/PLATELET
Abs Immature Granulocytes: 0.03 10*3/uL (ref 0.00–0.07)
Basophils Absolute: 0 10*3/uL (ref 0.0–0.1)
Basophils Relative: 1 %
Eosinophils Absolute: 0.1 10*3/uL (ref 0.0–0.5)
Eosinophils Relative: 2 %
HCT: 32.9 % — ABNORMAL LOW (ref 39.0–52.0)
Hemoglobin: 11 g/dL — ABNORMAL LOW (ref 13.0–17.0)
Immature Granulocytes: 1 %
Lymphocytes Relative: 41 %
Lymphs Abs: 1.2 10*3/uL (ref 0.7–4.0)
MCH: 36.8 pg — ABNORMAL HIGH (ref 26.0–34.0)
MCHC: 33.4 g/dL (ref 30.0–36.0)
MCV: 110 fL — ABNORMAL HIGH (ref 80.0–100.0)
Monocytes Absolute: 0.4 10*3/uL (ref 0.1–1.0)
Monocytes Relative: 15 %
Neutro Abs: 1.2 10*3/uL — ABNORMAL LOW (ref 1.7–7.7)
Neutrophils Relative %: 40 %
Platelets: 128 10*3/uL — ABNORMAL LOW (ref 150–400)
RBC: 2.99 MIL/uL — ABNORMAL LOW (ref 4.22–5.81)
RDW: 16.4 % — ABNORMAL HIGH (ref 11.5–15.5)
WBC: 3 10*3/uL — ABNORMAL LOW (ref 4.0–10.5)
nRBC: 0 % (ref 0.0–0.2)

## 2022-06-30 LAB — SAMPLE TO BLOOD BANK

## 2022-06-30 LAB — VITAMIN D 25 HYDROXY (VIT D DEFICIENCY, FRACTURES): Vit D, 25-Hydroxy: 28.07 ng/mL — ABNORMAL LOW (ref 30–100)

## 2022-07-09 ENCOUNTER — Other Ambulatory Visit: Payer: Self-pay | Admitting: Oncology

## 2022-07-15 ENCOUNTER — Inpatient Hospital Stay (HOSPITAL_BASED_OUTPATIENT_CLINIC_OR_DEPARTMENT_OTHER): Payer: Medicare HMO | Admitting: Oncology

## 2022-07-15 ENCOUNTER — Inpatient Hospital Stay: Payer: Medicare HMO

## 2022-07-15 ENCOUNTER — Encounter: Payer: Self-pay | Admitting: Oncology

## 2022-07-15 ENCOUNTER — Inpatient Hospital Stay: Payer: Medicare HMO | Attending: Oncology

## 2022-07-15 VITALS — BP 136/62 | HR 67 | Temp 97.8°F | Wt 263.0 lb

## 2022-07-15 DIAGNOSIS — E785 Hyperlipidemia, unspecified: Secondary | ICD-10-CM | POA: Insufficient documentation

## 2022-07-15 DIAGNOSIS — Z7982 Long term (current) use of aspirin: Secondary | ICD-10-CM | POA: Diagnosis not present

## 2022-07-15 DIAGNOSIS — I25110 Atherosclerotic heart disease of native coronary artery with unstable angina pectoris: Secondary | ICD-10-CM | POA: Diagnosis not present

## 2022-07-15 DIAGNOSIS — G4730 Sleep apnea, unspecified: Secondary | ICD-10-CM | POA: Insufficient documentation

## 2022-07-15 DIAGNOSIS — D469 Myelodysplastic syndrome, unspecified: Secondary | ICD-10-CM | POA: Diagnosis not present

## 2022-07-15 DIAGNOSIS — Z87442 Personal history of urinary calculi: Secondary | ICD-10-CM | POA: Insufficient documentation

## 2022-07-15 DIAGNOSIS — R5383 Other fatigue: Secondary | ICD-10-CM | POA: Diagnosis not present

## 2022-07-15 DIAGNOSIS — E119 Type 2 diabetes mellitus without complications: Secondary | ICD-10-CM | POA: Diagnosis not present

## 2022-07-15 DIAGNOSIS — Z79899 Other long term (current) drug therapy: Secondary | ICD-10-CM | POA: Diagnosis not present

## 2022-07-15 DIAGNOSIS — I200 Unstable angina: Secondary | ICD-10-CM | POA: Diagnosis not present

## 2022-07-15 DIAGNOSIS — Z794 Long term (current) use of insulin: Secondary | ICD-10-CM | POA: Insufficient documentation

## 2022-07-15 DIAGNOSIS — I480 Paroxysmal atrial fibrillation: Secondary | ICD-10-CM | POA: Diagnosis not present

## 2022-07-15 DIAGNOSIS — Z7901 Long term (current) use of anticoagulants: Secondary | ICD-10-CM | POA: Diagnosis not present

## 2022-07-15 DIAGNOSIS — I5032 Chronic diastolic (congestive) heart failure: Secondary | ICD-10-CM | POA: Insufficient documentation

## 2022-07-15 DIAGNOSIS — I110 Hypertensive heart disease with heart failure: Secondary | ICD-10-CM | POA: Diagnosis not present

## 2022-07-15 LAB — CBC WITH DIFFERENTIAL/PLATELET
Abs Immature Granulocytes: 0.05 10*3/uL (ref 0.00–0.07)
Basophils Absolute: 0 10*3/uL (ref 0.0–0.1)
Basophils Relative: 1 %
Eosinophils Absolute: 0.1 10*3/uL (ref 0.0–0.5)
Eosinophils Relative: 2 %
HCT: 32.6 % — ABNORMAL LOW (ref 39.0–52.0)
Hemoglobin: 11.1 g/dL — ABNORMAL LOW (ref 13.0–17.0)
Immature Granulocytes: 1 %
Lymphocytes Relative: 40 %
Lymphs Abs: 1.5 10*3/uL (ref 0.7–4.0)
MCH: 37.2 pg — ABNORMAL HIGH (ref 26.0–34.0)
MCHC: 34 g/dL (ref 30.0–36.0)
MCV: 109.4 fL — ABNORMAL HIGH (ref 80.0–100.0)
Monocytes Absolute: 0.4 10*3/uL (ref 0.1–1.0)
Monocytes Relative: 11 %
Neutro Abs: 1.6 10*3/uL — ABNORMAL LOW (ref 1.7–7.7)
Neutrophils Relative %: 45 %
Platelets: 128 10*3/uL — ABNORMAL LOW (ref 150–400)
RBC: 2.98 MIL/uL — ABNORMAL LOW (ref 4.22–5.81)
RDW: 15.5 % (ref 11.5–15.5)
WBC: 3.7 10*3/uL — ABNORMAL LOW (ref 4.0–10.5)
nRBC: 0 % (ref 0.0–0.2)

## 2022-07-15 LAB — COMPREHENSIVE METABOLIC PANEL
ALT: 23 U/L (ref 0–44)
AST: 19 U/L (ref 15–41)
Albumin: 3.9 g/dL (ref 3.5–5.0)
Alkaline Phosphatase: 58 U/L (ref 38–126)
Anion gap: 8 (ref 5–15)
BUN: 21 mg/dL (ref 8–23)
CO2: 27 mmol/L (ref 22–32)
Calcium: 8.9 mg/dL (ref 8.9–10.3)
Chloride: 102 mmol/L (ref 98–111)
Creatinine, Ser: 1.09 mg/dL (ref 0.61–1.24)
GFR, Estimated: >60 mL/min (ref >60)
Glucose, Bld: 215 mg/dL — ABNORMAL HIGH (ref 70–99)
Potassium: 4.5 mmol/L (ref 3.5–5.1)
Sodium: 137 mmol/L (ref 135–145)
Total Bilirubin: 0.6 mg/dL (ref 0.3–1.2)
Total Protein: 8.1 g/dL (ref 6.5–8.1)

## 2022-07-15 NOTE — Assessment & Plan Note (Signed)
Multifactorial, due to his medical problems. Out of proportion to his current hemoglobin level.  B12 is wnl, <400, continue  oral B12 573mg daily.  Normal TSH

## 2022-07-15 NOTE — Assessment & Plan Note (Signed)
CHF, Afib. S/p cardiac cath. + CAD, resumed on Eliquis  Echo showed preserved LVEF with normal bioprosthetic aortic valve function  Follow up with cardiology Dr. Saunders Revel.

## 2022-07-15 NOTE — Progress Notes (Signed)
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 Lucianne Lei Deventer's notes were reviewed.  I agree with initiating EPO therapy if his hemoglobin is <=8 or <=9 with evidence of myocardium ischemia.  Repeat cbc today showed a hemoglobin >11, no need to initiate therapy for now.  Continue close monitor, repeat cbc monthly 05/01/2022 ferritin is 366, at goal.  Fatigue Multifactorial, due to his medical problems. Out of proportion to his current hemoglobin level.  B12 is wnl, <400, continue  oral B12 565mg daily.  Normal TSH    Accelerating angina (HCC) CHF, Afib. S/p cardiac cath. + CAD, resumed on Eliquis  Echo showed preserved LVEF with normal bioprosthetic aortic valve function  Follow up with cardiology Dr. ESaunders Revel    Orders Placed This Encounter  Procedures   CBC with Differential/Platelet    Standing Status:   Future    Standing Expiration Date:   07/15/2023   CBC with Differential/Platelet    Standing Status:   Future    Standing Expiration Date:   07/15/2023   CBC with Differential/Platelet    Standing Status:   Future    Standing Expiration Date:   07/15/2023   Iron and TIBC([facility])    Standing Status:   Future    Standing Expiration Date:   07/16/2023   Ferritin    Standing Status:   Future    Standing Expiration Date:   07/16/2023   Retic Panel    Standing Status:   Future    Standing Expiration Date:   07/16/2023   Follow up per LOS All questions were answered. The patient knows to call the clinic with any problems, questions or concerns.  ZEarlie Server MD, PhD [facility] Oncology 07/15/2022     CHIEF COMPLAINTS/PURPOSE OF CONSULTATION:   MDS  HISTORY OF PRESENTING ILLNESS:  Jonathan Cross 77y.o. male presents to establish care for MDS I have reviewed his chart and materials related to his cancer extensively and collaborated history with the patient. Summary of oncologic history is as follows: Oncology History  Myelodysplastic syndrome [place]  05/21/2022 Bone Marrow Biopsy   Bone marrow biopsy showed  - - Hypercellular bone marrow (70%) with trilineage hematopoiesis, megakaryocytic dyspoiesis, and 6% blasts by manual aspirate differential (see Comment)  The current findings of pancytopenia, hypercellular bone marrow with megakaryocyte dyspoiesis (>10%) and increased blasts (6%) in addition to the presence of a clonal molecular abnormality (ZRSRS mutation with VAF 50.7) are consistent with myelodysplastic neoplasm with increased blasts (MDS-IB-1) in the absence of MDS or AML defining genetic abnormality.    06/12/2022 Initial Diagnosis   Myelodysplastic syndrome ([facility]  Patient follows up with [facility] Dr. VKatina Dung  Patient has fatigue, macrocytic anemia and thrombocytopenia, myeloid mutation panel in August 2023, which returned with ZRSR2 mutation  Bone biopsy was done. See above.  Using the IPSS-M, he has low risk disease with a median leukemia free survival of about 6 years.  There is concern of MDS may add to his possible angina.  Dr. VKatina Dungrecommends treatment  1) IF he has evidence of cardiac ischemia that has been maximally medically managed AND a hemoglobin < 9 g/dL 2 ) If his hemoglobin was <  8.0   Darbepoietin/GCSF per Abram Sander al [facility] Hem 2013 May; 92(5) Jerelyn Scott 2013 May; 92(5) [email or web address])  Ferritin: Treat with iron if ferritin is < 50 Treatment  Darbepoetin 500 mcg every 2 weeks CR = Hgb > 11.5 PR = Inc in Hgb of 1.5 If no response in 12 weeks, add G-CSF 300 mcg two times a week to maintain WBC counts between 5  and 10  If no response at 24 weeks, discontinue trial If hemoglobin is > 12, hold darbepoetin until hemoglobin is < 11. Resume darbepoetin at every 3 weeks. Continue to increase interval by 1 week if hemoglobin increases to > 12 again.      His hemoglobin was 8 on 05/01/22, and he received one unit of PRBC transfusion.  He is accompanied by his daughter today.  + chronic fatigue, intermittent chest pain, stable, not worse.    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 04/2022 Echo: EF 60-65%, no rwma, GrII DD, nl RV fxn, mildly dil LA, mild MR, nl fxn'ing biopros AoV.   Coronary artery disease    a. 01/2016 CABG x 2: LIMA->LAD, VG->OM; b. 05/2021 MV: small, mild, rev apical lateral and apical inf defects ->subtle ischemia vs artifact, EF 55-65%-->low risk.   Diabetes ([place])    H/O aortic valve replacement    a. 01/2016 s/p bioprosthetic AVR @ [facility] for Severe AS; b. 05/2021 Echo: EF 55-60%, nl fxn of bioprosthetic AVR; c. 04/2022 Echo: EF 60-65%, nl fxn'ing AoV.   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 CHA2DS2VASc = 5-->eliquis/amio.   Sleep apnea     SURGICAL HISTORY: Past Surgical History:  Procedure Laterality Date   AORTIC VALVE REPLACEMENT  2017   [facility], bioprosthetic   APPENDECTOMY     BONE MARROW BIOPSY     05/2022   CARDIAC VALVE REPLACEMENT     CORONARY ARTERY BYPASS GRAFT  2017   [facility] - LIMA-LAD and SVG-OM   CYSTOSCOPY W/ RETROGRADES Right 05/27/2019   Procedure: CYSTOSCOPY WITH RETROGRADE PYELOGRAM;  Surgeon: Billey Co, MD;  Location: [facility];  Service: Urology;  Laterality: Right;   CYSTOSCOPY W/ RETROGRADES Right 07/11/2019   Procedure: CYSTOSCOPY WITH  RETROGRADE PYELOGRAM;  Surgeon: Billey Co, MD;  Location: [facility];  Service: Urology;  Laterality: Right;   CYSTOSCOPY/URETEROSCOPY/HOLMIUM LASER/STENT PLACEMENT Right 05/27/2019   Procedure: CYSTOSCOPY/URETEROSCOPY/HOLMIUM LASER/STENT PLACEMENT;  Surgeon: Billey Co, MD;  Location: [facility];  Service: Urology;  Laterality: Right;   CYSTOSCOPY/URETEROSCOPY/HOLMIUM LASER/STENT PLACEMENT Right 06/17/2019   Procedure: CYSTOSCOPY/URETEROSCOPY/HOLMIUM LASER/STENT Exchange;  Surgeon: Billey Co, MD;  Location: [facility];  Service: Urology;  Laterality: Right;   CYSTOSCOPY/URETEROSCOPY/HOLMIUM LASER/STENT PLACEMENT Right 07/11/2019   Procedure: CYSTOSCOPY/URETEROSCOPY/HOLMIUM LASER/STENT Exchange;  Surgeon: Billey Co, MD;  Location: [facility];  Service: Urology;  Laterality: Right;   RIGHT HEART CATH AND CORONARY/GRAFT ANGIOGRAPHY Bilateral 06/17/2022   Procedure: RIGHT HEART CATH AND CORONARY/GRAFT ANGIOGRAPHY;  Surgeon: Nelva Bush, MD;  Location: [facility];  Service: Cardiovascular;  Laterality: Bilateral;   STONE EXTRACTION WITH BASKET Right 07/11/2019   Procedure: STONE EXTRACTION WITH BASKET;  Surgeon: Billey Co, MD;  Location:  [facility];  Service: Urology;  Laterality: Right;   TEE WITHOUT CARDIOVERSION N/A 04/21/2019   Procedure: TRANSESOPHAGEAL ECHOCARDIOGRAM (TEE);  Surgeon: Minna Merritts, MD;  Location: [facility];  Service: Cardiovascular;  Laterality: N/A;   TEE WITHOUT CARDIOVERSION N/A 07/04/2019   Procedure: TRANSESOPHAGEAL ECHOCARDIOGRAM (TEE);  Surgeon: Wellington Hampshire, MD;  Location: [facility];  Service: Cardiovascular;  Laterality: N/A;   TEE WITHOUT CARDIOVERSION N/A 08/17/2019   Procedure: TRANSESOPHAGEAL ECHOCARDIOGRAM (TEE);  Surgeon: Kate Sable, MD;  Location: [facility];  Service: Cardiovascular;  Laterality: N/A;   TEE WITHOUT CARDIOVERSION N/A 11/15/2019   Procedure: TRANSESOPHAGEAL ECHOCARDIOGRAM (TEE);  Surgeon: Nelva Bush, MD;  Location: [facility];  Service: Cardiovascular;  Laterality: N/A;   TONSILLECTOMY      SOCIAL HISTORY: Social History   Socioeconomic History   Marital status: Married    Spouse name: Not on file   Number of children: Not on file   Years of education: Not on file   Highest education level: Not on file  Occupational History   Not on file  Tobacco Use   Smoking status: Former   Smokeless tobacco: Never  Vaping Use   Vaping Use: Never used  Substance and Sexual Activity   Alcohol use: Never   Drug use: Never   Sexual activity: Not Currently  Other Topics Concern   Not on file  Social History Narrative   Not on file   Social Determinants of Health   Financial Resource Strain: Not on file  Food Insecurity: Not on file  Transportation Needs: Not on file  Physical Activity: Not on file  Stress: Not on file  Social Connections: Not on file  Intimate Partner Violence: Not on file    FAMILY HISTORY: Family History  Problem Relation Age of Onset   Heart attack Father 89   Heart attack Sister     ALLERGIES:  is allergic to metformin.  MEDICATIONS:  Current Outpatient Medications  Medication Sig Dispense Refill   amiodarone (PACERONE) 200 MG tablet Take 1 tablet by mouth once daily 90 tablet 0   apixaban (ELIQUIS) 5 MG TABS tablet Take 5 mg by mouth 2 (two) times daily.     aspirin EC 81 MG tablet Take 81 mg by mouth daily. Swallow whole.     atorvastatin (LIPITOR) 80 MG tablet Take 1 tablet (80 mg total) by mouth every evening. 90 tablet 3   furosemide (LASIX) 40 MG tablet Take 1 tablet (40 mg total) by mouth 2 (two) times daily. 180 tablet 3   insulin NPH-regular Human (70-30) 100 UNIT/ML injection Inject 120 Units into the skin 2 (two) times daily.     isosorbide mononitrate (IMDUR) 30 MG 24 hr tablet Take 0.5 tablets (15 mg total) by mouth daily. 45 tablet 3   loratadine (CLARITIN) 10 MG tablet Take 10 mg by mouth daily.     Magnesium Oxide 400 MG  CAPS Take 1 capsule (400 mg total) by mouth daily. 90 capsule 3   meclizine (ANTIVERT) 25 MG tablet Take 1 tablet (25 mg total) by mouth 3 (three) times daily as needed for dizziness. 20 tablet 0   metoprolol succinate (TOPROL-XL) 25 MG 24 hr tablet Take 25 mg by mouth 2 (two) times daily.     nitroGLYCERIN (NITROSTAT) 0.4 MG SL tablet Place 0.4 mg under the tongue every 5 (five) minutes x 3 doses as needed for chest pain.      ranolazine (RANEXA) 500 MG 12 hr tablet Take 1  tablet (500 mg total) by mouth 2 (two) times daily. 60 tablet 5   tamsulosin (FLOMAX) 0.4 MG CAPS capsule Take 0.4 mg by mouth daily.     vitamin B-12 (CYANOCOBALAMIN) 500 MCG tablet Take 1 tablet (500 mcg total) by mouth daily. 90 tablet 0   No current facility-administered medications for this visit.    Review of Systems  Constitutional:  Positive for fatigue. Negative for appetite change, chills, fever and unexpected weight change.  HENT:   Negative for hearing loss and voice change.   Eyes:  Negative for eye problems and icterus.  Respiratory:  Positive for chest tightness. Negative for cough and shortness of breath.   Cardiovascular:  Negative for chest pain and leg swelling.  Gastrointestinal:  Negative for abdominal distention and abdominal pain.  Endocrine: Negative for hot flashes.  Genitourinary:  Negative for difficulty urinating, dysuria and frequency.   Musculoskeletal:  Negative for arthralgias.  Skin:  Negative for itching and rash.  Neurological:  Negative for light-headedness and numbness.  Hematological:  Negative for adenopathy. Does not bruise/bleed easily.  Psychiatric/Behavioral:  Negative for confusion.      PHYSICAL EXAMINATION:  Vitals:   07/15/22 1017  BP: 136/62  Pulse: 67  Temp: 97.8 F (36.6 C)  SpO2: 99%   Filed Weights   07/15/22 1017  Weight: 263 lb (119.3 kg)    Physical Exam Constitutional:      General: He is not in acute distress.    Appearance: He is obese. He is  not diaphoretic.  HENT:     Head: Normocephalic and atraumatic.     Nose: Nose normal.     Mouth/Throat:     Pharynx: No oropharyngeal exudate.  Eyes:     General: No scleral icterus.    Pupils: Pupils are equal, round, and reactive to light.  Cardiovascular:     Rate and Rhythm: Normal rate.  Pulmonary:     Effort: Pulmonary effort is normal. No respiratory distress.     Breath sounds: No wheezing.  Abdominal:     General: Bowel sounds are normal. There is no distension.     Palpations: Abdomen is soft.     Tenderness: There is no abdominal tenderness.  Musculoskeletal:        General: Normal range of motion.     Cervical back: Normal range of motion.  Skin:    General: Skin is warm and dry.     Findings: No erythema.  Neurological:     Mental Status: He is alert and oriented to person, place, and time. Mental status is at baseline.     Cranial Nerves: No cranial nerve deficit.     Motor: No abnormal muscle tone.     Coordination: Coordination normal.  Psychiatric:        Mood and Affect: Affect normal.      LABORATORY DATA:  I have reviewed the data as listed    Latest Ref Rng & Units 07/15/2022   10:01 AM 06/30/2022    1:22 PM 06/17/2022    2:20 PM  CBC  WBC 4.0 - 10.5 K/uL 3.7  3.0    Hemoglobin 13.0 - 17.0 g/dL 11.1  11.0  10.5   Hematocrit 39.0 - 52.0 % 32.6  32.9  31.0   Platelets 150 - 400 K/uL 128  128        Latest Ref Rng & Units 07/15/2022   10:01 AM 06/26/2022    3:22 PM 06/17/2022    2:20  PM  CMP  Glucose 70 - 99 mg/dL 215  259    BUN 8 - 23 mg/dL 21  21    Creatinine 0.61 - 1.24 mg/dL 1.09  1.07    Sodium 135 - 145 mmol/L 137  134  136   Potassium 3.5 - 5.1 mmol/L 4.5  4.3  4.2   Chloride 98 - 111 mmol/L 102  107    CO2 22 - 32 mmol/L 27  24    Calcium 8.9 - 10.3 mg/dL 8.9  8.6    Total Protein 6.5 - 8.1 g/dL 8.1     Total Bilirubin 0.3 - 1.2 mg/dL 0.6     Alkaline Phos 38 - 126 U/L 58     AST 15 - 41 U/L 19     ALT 0 - 44 U/L 23         RADIOGRAPHIC STUDIES: I have personally reviewed the radiological images as listed and agreed with the findings in the report. CARDIAC CATHETERIZATION  Result Date: 06/17/2022 Conclusions: Significant multivessel coronary artery disease, as detailed below, including 80% ostial LAD disease followed by sequential 50-60% mid LAD lesions, 50% proximal LCx disease, and 70-80% lesion in proximal segment of rPL1. Widely patent LIMA-LAD and SVG-OM2. Moderately elevated left heart filling pressures. Mildly elevated right heart and pulmonary artery pressures. Normal cardiac output/index. Recommendations: Escalate diuresis with repeat BMP at follow-up visit next week. Secondary prevention of coronary artery disease and continued antianginal therapy. 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 Nelva Bush, MD [facility]

## 2022-07-15 NOTE — Assessment & Plan Note (Addendum)
ZRSR2 mutation.MDS-IB-1 [facility] Dr. Lucianne Lei Deventer's notes were reviewed.  I agree with initiating EPO therapy if his hemoglobin is <=8 or <=9 with evidence of myocardium ischemia.  Repeat cbc today showed a hemoglobin >11, no need to initiate therapy for now.  Continue close monitor, repeat cbc monthly 05/01/2022 ferritin is 366, at goal.

## 2022-07-16 ENCOUNTER — Other Ambulatory Visit: Payer: Self-pay | Admitting: Internal Medicine

## 2022-07-16 ENCOUNTER — Encounter: Payer: Self-pay | Admitting: Internal Medicine

## 2022-07-18 ENCOUNTER — Ambulatory Visit: Payer: Medicare HMO | Admitting: Medical

## 2022-08-10 NOTE — Progress Notes (Deleted)
Cardiology Clinic Note   Patient Name: Jonathan Cross. Date of Encounter: 08/10/2022  Primary Care Provider:  Atilano Ina, MD Primary Cardiologist:  Jonathan Bush, MD  Patient Profile    77 year old male with history of CAD status post CABG x 2, severe stenosis status post by prostatic and aortic valve replacement, paroxysmal atrial fibrillation, hypertension, hyperlipidemia, HFpEF, type 2 diabetes, sleep apnea, who presents today for follow-up of his coronary artery disease and HFpEF.  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 04/2022 Echo: EF 60-65%, no rwma, GrII DD, nl RV fxn, mildly dil LA, mild MR, nl fxn'ing biopros AoV.   Coronary artery disease    a. 01/2016 CABG x 2: LIMA->LAD, VG->OM; b. 05/2021 MV: small, mild, rev apical lateral and apical inf defects ->subtle ischemia vs artifact, EF 55-65%-->low risk.   Diabetes ([place])    H/O aortic valve replacement    a. 01/2016 s/p bioprosthetic AVR @ [facility] for Severe AS; b. 05/2021 Echo: EF 55-60%, nl fxn of bioprosthetic AVR; c. 04/2022 Echo: EF 60-65%, nl fxn'ing AoV.   History of kidney stones    Hx of CABG    Hyperlipidemia    Hypertension    MDS (myelodysplastic syndrome), low grade ([place]) 04/2022   Myelodysplastic syndrome ([place]) 05/2022   PAF (paroxysmal atrial fibrillation) ([facility])    a. CHA2DS2VASc = 5-->eliquis/amio.   Sleep apnea    Past Surgical History:  Procedure Laterality Date   AORTIC VALVE REPLACEMENT  2017   [facility], bioprosthetic   APPENDECTOMY     BONE MARROW BIOPSY     05/2022   CARDIAC VALVE REPLACEMENT     CORONARY ARTERY BYPASS GRAFT  2017   [facility] - LIMA-LAD and SVG-OM   CYSTOSCOPY W/ RETROGRADES Right 05/27/2019   Procedure: CYSTOSCOPY WITH RETROGRADE PYELOGRAM;  Surgeon: Billey Co, MD;  Location: [facility];  Service: Urology;  Laterality: Right;    CYSTOSCOPY W/ RETROGRADES Right 07/11/2019   Procedure: CYSTOSCOPY WITH RETROGRADE PYELOGRAM;  Surgeon: Billey Co, MD;  Location: [facility];  Service: Urology;  Laterality: Right;   CYSTOSCOPY/URETEROSCOPY/HOLMIUM LASER/STENT PLACEMENT Right 05/27/2019   Procedure: CYSTOSCOPY/URETEROSCOPY/HOLMIUM LASER/STENT PLACEMENT;  Surgeon: Billey Co, MD;  Location: [facility];  Service: Urology;  Laterality: Right;   CYSTOSCOPY/URETEROSCOPY/HOLMIUM LASER/STENT PLACEMENT Right 06/17/2019   Procedure: CYSTOSCOPY/URETEROSCOPY/HOLMIUM LASER/STENT Exchange;  Surgeon: Billey Co, MD;  Location: [facility];  Service: Urology;  Laterality: Right;   CYSTOSCOPY/URETEROSCOPY/HOLMIUM LASER/STENT PLACEMENT Right 07/11/2019   Procedure: CYSTOSCOPY/URETEROSCOPY/HOLMIUM LASER/STENT Exchange;  Surgeon: Billey Co, MD;  Location: [facility];  Service: Urology;  Laterality: Right;   RIGHT HEART CATH AND CORONARY/GRAFT ANGIOGRAPHY Bilateral 06/17/2022   Procedure: RIGHT HEART CATH AND CORONARY/GRAFT ANGIOGRAPHY;  Surgeon: Jonathan Bush, MD;  Location: [place] [facility];  Service: Cardiovascular;  Laterality: Bilateral;   STONE EXTRACTION WITH BASKET Right 07/11/2019   Procedure: STONE EXTRACTION WITH BASKET;  Surgeon: Billey Co, MD;  Location: [facility];  Service: Urology;  Laterality: Right;   TEE WITHOUT CARDIOVERSION N/A 04/21/2019   Procedure: TRANSESOPHAGEAL ECHOCARDIOGRAM (TEE);  Surgeon: Minna Merritts, MD;  Location: [facility];  Service: Cardiovascular;  Laterality: N/A;   TEE WITHOUT CARDIOVERSION N/A 07/04/2019   Procedure: TRANSESOPHAGEAL ECHOCARDIOGRAM (TEE);  Surgeon: Wellington Hampshire, MD;  Location: [facility];  Service: Cardiovascular;  Laterality: N/A;   TEE WITHOUT  CARDIOVERSION N/A 08/17/2019   Procedure: TRANSESOPHAGEAL ECHOCARDIOGRAM (TEE);  Surgeon: Kate Sable, MD;  Location: [facility];  Service: Cardiovascular;  Laterality: N/A;   TEE WITHOUT CARDIOVERSION N/A  11/15/2019   Procedure: TRANSESOPHAGEAL ECHOCARDIOGRAM (TEE);  Surgeon: Jonathan Bush, MD;  Location: [facility];  Service: Cardiovascular;  Laterality: N/A;   TONSILLECTOMY      Allergies  Allergies  Allergen Reactions   Metformin Diarrhea    Loose stools even with XR    History of Present Illness    Jonathan Cross. is a 77 year old male with a previously mentioned complex past medical history of coronary artery disease status post CABG x 2 (LIMA-LAD, SVG-obtuse marginal), severe aortic stenosis status post bioprosthetic aortic valve replacement complicated by endocarditis, paroxysmal atrial fibrillation, hypertension, hyperlipidemia, HFpEF, type 2 diabetes, sleep apnea, and BPH.  He was previously followed by [facility] in the setting of severe aortic stenosis and underwent bypass with prosthetic aortic valve replacement in 2 coronary artery bypass grafting in July 2017.  Between October 2020 and May 2021 he had multiple admissions related to fever, malaise, and Enterococcus bacteremia.  TEE in October 2020 and December 2020 were negative for vegetation.  In February 2021 he was found to have a mobile mass on his prosthetic aortic valve and was treated with antibiotic therapy.  Repeat TEE in May 2021 continued to show an echodensity that appeared less mobile but larger measuring.  Perivalvular abscess cannot be excluded at that time and he was transferred to [facility] for cardiac CT which revealed evidence of a small mass in the leaflet of the noncoronary cusp, likely representing a healed vegetation.  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  Both grafts were visualized and were patent.  CT surgery evaluated and recommended continued observation and antibiotic therapy.  November 2022 was admitted with dyspnea and chest fullness and found to be in atrial fibrillation RVR and was converted to sinus rhythm with IV  amiodarone.  He did undergo Lexiscan MPI which showed small mild reversible apical lateral and anterior inferior defect with question of suitable ischemia versus infarct.  Ultimately was felt to be a low risk study and he was medically managed.  April 2023 he was evaluated for progressive dyspnea and a 30 pound weight gain.  He was referred to the emergency department workup was unremarkable and he was discharged.  He did multiple emergency department visits between [facility] and [facility] for chest pain.  In October 2023 echocardiogram revealed preserved LVEF with normal bioprosthetic aortic valve function.  Cardiac catheterization was discussed at his last admission but was deferred pending workup of anemia and he had previously been diagnosed with myelodysplastic syndrome.  He underwent a right and left heart catheterization was completed 06/17/2022 which revealed significant multivessel coronary disease including 80% ostial LAD followed vascular sequential 50 to 60% mid LAD lesion, 50% proximal left circumflex, and 70-80% lesion in the proximal segment of the RPL 1.  Widely patent LIMA-LAD and SVG-OM 2, moderately elevated left heart filling pressures, mildly elevated right heart and pulmonary artery pressures, normal cardiac output/index.  Diuresis was escalated replete blood work on return to clinic as well as secondary prevention of coronary disease and continued antianginal therapy.  He was last seen in clinic 06/26/2022 accompanied by his daughter stating that he still continued with fatigue and shortness of breath.  He has chronic chest discomfort since he had his valve  changed and also continue to follow-up with oncology for his MDS.  At his appointment of his medications were changed and no further testing was ordered and he did have follow-up blood work.  He returns to clinic today  Home Medications    Current Outpatient Medications  Medication Sig Dispense Refill   amiodarone (PACERONE) 200 MG tablet  Take 1 tablet by mouth once daily 90 tablet 1   apixaban (ELIQUIS) 5 MG TABS tablet Take 5 mg by mouth 2 (two) times daily.     aspirin EC 81 MG tablet Take 81 mg by mouth daily. Swallow whole.     atorvastatin (LIPITOR) 80 MG tablet Take 1 tablet (80 mg total) by mouth every evening. 90 tablet 3   furosemide (LASIX) 40 MG tablet Take 1 tablet (40 mg total) by mouth 2 (two) times daily. 180 tablet 3   insulin NPH-regular Human (70-30) 100 UNIT/ML injection Inject 120 Units into the skin 2 (two) times daily.     isosorbide mononitrate (IMDUR) 30 MG 24 hr tablet Take 0.5 tablets (15 mg total) by mouth daily. 45 tablet 3   loratadine (CLARITIN) 10 MG tablet Take 10 mg by mouth daily.     Magnesium Oxide 400 MG CAPS Take 1 capsule (400 mg total) by mouth daily. 90 capsule 3   meclizine (ANTIVERT) 25 MG tablet Take 1 tablet (25 mg total) by mouth 3 (three) times daily as needed for dizziness. 20 tablet 0   metoprolol succinate (TOPROL-XL) 25 MG 24 hr tablet Take 25 mg by mouth 2 (two) times daily.     nitroGLYCERIN (NITROSTAT) 0.4 MG SL tablet Place 0.4 mg under the tongue every 5 (five) minutes x 3 doses as needed for chest pain.      ranolazine (RANEXA) 500 MG 12 hr tablet Take 1 tablet (500 mg total) by mouth 2 (two) times daily. 60 tablet 5   tamsulosin (FLOMAX) 0.4 MG CAPS capsule Take 0.4 mg by mouth daily.     vitamin B-12 (CYANOCOBALAMIN) 500 MCG tablet Take 1 tablet (500 mcg total) by mouth daily. 90 tablet 0   No current facility-administered medications for this visit.     Family History    Family History  Problem Relation Age of Onset   Heart attack Father 58   Heart attack Sister    He indicated that his mother is deceased. He indicated that his father is deceased. He indicated that two of his three sisters are alive.  Social History    Social History   Socioeconomic History   Marital status: Married    Spouse name: Not on file   Number of children: Not on file   Years of  education: Not on file   Highest education level: Not on file  Occupational History   Not on file  Tobacco Use   Smoking status: Former   Smokeless tobacco: Never  Vaping Use   Vaping Use: Never used  Substance and Sexual Activity   Alcohol use: Never   Drug use: Never   Sexual activity: Not Currently  Other Topics Concern   Not on file  Social History Narrative   Not on file   Social Determinants of Health   Financial Resource Strain: Not on file  Food Insecurity: Not on file  Transportation Needs: Not on file  Physical Activity: Not on file  Stress: Not on file  Social Connections: Not on file  Intimate Partner Violence: Not on file     Review  of Systems    General:  No chills, fever, night sweats or weight changes.  Cardiovascular:  No chest pain, dyspnea on exertion, edema, orthopnea, palpitations, paroxysmal nocturnal dyspnea. Dermatological: No rash, lesions/masses Respiratory: No cough, dyspnea Urologic: No hematuria, dysuria Abdominal:   No nausea, vomiting, diarrhea, bright red blood per rectum, melena, or hematemesis Neurologic:  No visual changes, wkns, changes in mental status. All other systems reviewed and are otherwise negative except as noted above.     Physical Exam    VS:  There were no vitals taken for this visit. , BMI There is no height or weight on file to calculate BMI.     GEN: Well nourished, well developed, in no acute distress. HEENT: normal. Neck: Supple, no JVD, carotid bruits, or masses. Cardiac: RRR, no murmurs, rubs, or gallops. No clubbing, cyanosis, edema.  Radials 2+/PT 2+ and equal bilaterally.  Respiratory:  Respirations regular and unlabored, clear to auscultation bilaterally. GI: Soft, nontender, nondistended, BS + x 4. MS: no deformity or atrophy. Skin: warm and dry, no rash. Neuro:  Strength and sensation are intact. Psych: Normal affect.  Accessory Clinical Findings    ECG personally reviewed by me today- *** - No  acute changes  Lab Results  Component Value Date   WBC 3.7 (L) 07/15/2022   HGB 11.1 (L) 07/15/2022   HCT 32.6 (L) 07/15/2022   MCV 109.4 (H) 07/15/2022   PLT 128 (L) 07/15/2022   Lab Results  Component Value Date   CREATININE 1.09 07/15/2022   BUN 21 07/15/2022   NA 137 07/15/2022   K 4.5 07/15/2022   CL 102 07/15/2022   CO2 27 07/15/2022   Lab Results  Component Value Date   ALT 23 07/15/2022   AST 19 07/15/2022   ALKPHOS 58 07/15/2022   BILITOT 0.6 07/15/2022   Lab Results  Component Value Date   CHOL 92 06/03/2021   HDL 16 (L) 06/03/2021   LDLCALC 49 06/03/2021   TRIG 134 06/03/2021   CHOLHDL 5.8 06/03/2021    Lab Results  Component Value Date   HGBA1C 8.3 (H) 05/02/2022    Assessment & Plan   1.  ***  Gretel Cantu, NP 08/10/2022, 11:57 AM

## 2022-08-11 ENCOUNTER — Emergency Department: Payer: Medicare HMO

## 2022-08-11 ENCOUNTER — Other Ambulatory Visit: Payer: Self-pay

## 2022-08-11 ENCOUNTER — Observation Stay
Admission: EM | Admit: 2022-08-11 | Discharge: 2022-08-12 | Disposition: A | Discharge status: Home / Self Care | Payer: Medicare HMO | Attending: Internal Medicine | Admitting: Internal Medicine

## 2022-08-11 ENCOUNTER — Encounter: Payer: Self-pay | Admitting: Emergency Medicine

## 2022-08-11 ENCOUNTER — Observation Stay: Payer: Medicare HMO

## 2022-08-11 DIAGNOSIS — Z7982 Long term (current) use of aspirin: Secondary | ICD-10-CM | POA: Diagnosis not present

## 2022-08-11 DIAGNOSIS — I480 Paroxysmal atrial fibrillation: Secondary | ICD-10-CM | POA: Diagnosis not present

## 2022-08-11 DIAGNOSIS — Z87891 Personal history of nicotine dependence: Secondary | ICD-10-CM | POA: Diagnosis not present

## 2022-08-11 DIAGNOSIS — Z952 Presence of prosthetic heart valve: Secondary | ICD-10-CM

## 2022-08-11 DIAGNOSIS — Z951 Presence of aortocoronary bypass graft: Secondary | ICD-10-CM | POA: Diagnosis not present

## 2022-08-11 DIAGNOSIS — I25118 Atherosclerotic heart disease of native coronary artery with other forms of angina pectoris: Secondary | ICD-10-CM | POA: Diagnosis present

## 2022-08-11 DIAGNOSIS — D469 Myelodysplastic syndrome, unspecified: Secondary | ICD-10-CM | POA: Diagnosis present

## 2022-08-11 DIAGNOSIS — I5030 Unspecified diastolic (congestive) heart failure: Secondary | ICD-10-CM | POA: Diagnosis not present

## 2022-08-11 DIAGNOSIS — I10 Essential (primary) hypertension: Secondary | ICD-10-CM | POA: Diagnosis present

## 2022-08-11 DIAGNOSIS — E66812 Obesity, class 2: Secondary | ICD-10-CM

## 2022-08-11 DIAGNOSIS — E119 Type 2 diabetes mellitus without complications: Secondary | ICD-10-CM | POA: Diagnosis not present

## 2022-08-11 DIAGNOSIS — D61818 Other pancytopenia: Secondary | ICD-10-CM | POA: Diagnosis not present

## 2022-08-11 DIAGNOSIS — Z7901 Long term (current) use of anticoagulants: Secondary | ICD-10-CM | POA: Diagnosis not present

## 2022-08-11 DIAGNOSIS — R0789 Other chest pain: Principal | ICD-10-CM | POA: Diagnosis present

## 2022-08-11 DIAGNOSIS — I4891 Unspecified atrial fibrillation: Secondary | ICD-10-CM | POA: Diagnosis present

## 2022-08-11 DIAGNOSIS — I110 Hypertensive heart disease with heart failure: Secondary | ICD-10-CM | POA: Insufficient documentation

## 2022-08-11 DIAGNOSIS — I25110 Atherosclerotic heart disease of native coronary artery with unstable angina pectoris: Secondary | ICD-10-CM | POA: Insufficient documentation

## 2022-08-11 DIAGNOSIS — R079 Chest pain, unspecified: Secondary | ICD-10-CM

## 2022-08-11 DIAGNOSIS — Z794 Long term (current) use of insulin: Secondary | ICD-10-CM | POA: Diagnosis not present

## 2022-08-11 DIAGNOSIS — Z955 Presence of coronary angioplasty implant and graft: Secondary | ICD-10-CM | POA: Diagnosis not present

## 2022-08-11 DIAGNOSIS — K219 Gastro-esophageal reflux disease without esophagitis: Secondary | ICD-10-CM | POA: Diagnosis present

## 2022-08-11 DIAGNOSIS — G4733 Obstructive sleep apnea (adult) (pediatric): Secondary | ICD-10-CM | POA: Diagnosis present

## 2022-08-11 DIAGNOSIS — I200 Unstable angina: Secondary | ICD-10-CM | POA: Diagnosis present

## 2022-08-11 DIAGNOSIS — E785 Hyperlipidemia, unspecified: Secondary | ICD-10-CM | POA: Diagnosis present

## 2022-08-11 LAB — TROPONIN I (HIGH SENSITIVITY)
Troponin I (High Sensitivity): 20 ng/L — ABNORMAL HIGH (ref <18)
Troponin I (High Sensitivity): 19 ng/L — ABNORMAL HIGH (ref <18)

## 2022-08-11 LAB — CBC
HCT: 34.2 % — ABNORMAL LOW (ref 39.0–52.0)
Hemoglobin: 11.1 g/dL — ABNORMAL LOW (ref 13.0–17.0)
MCH: 36.6 pg — ABNORMAL HIGH (ref 26.0–34.0)
MCHC: 32.5 g/dL (ref 30.0–36.0)
MCV: 112.9 fL — ABNORMAL HIGH (ref 80.0–100.0)
Platelets: 126 10*3/uL — ABNORMAL LOW (ref 150–400)
RBC: 3.03 MIL/uL — ABNORMAL LOW (ref 4.22–5.81)
RDW: 15.1 % (ref 11.5–15.5)
WBC: 3.2 10*3/uL — ABNORMAL LOW (ref 4.0–10.5)
nRBC: 0 % (ref 0.0–0.2)

## 2022-08-11 LAB — COMPREHENSIVE METABOLIC PANEL
ALT: 23 U/L (ref 0–44)
AST: 22 U/L (ref 15–41)
Albumin: 4.1 g/dL (ref 3.5–5.0)
Alkaline Phosphatase: 49 U/L (ref 38–126)
Anion gap: 8 (ref 5–15)
BUN: 21 mg/dL (ref 8–23)
CO2: 27 mmol/L (ref 22–32)
Calcium: 9.1 mg/dL (ref 8.9–10.3)
Chloride: 97 mmol/L — ABNORMAL LOW (ref 98–111)
Creatinine, Ser: 1.1 mg/dL (ref 0.61–1.24)
GFR, Estimated: >60 mL/min (ref >60)
Glucose, Bld: 226 mg/dL — ABNORMAL HIGH (ref 70–99)
Potassium: 4.3 mmol/L (ref 3.5–5.1)
Sodium: 132 mmol/L — ABNORMAL LOW (ref 135–145)
Total Bilirubin: 0.9 mg/dL (ref 0.3–1.2)
Total Protein: 8 g/dL (ref 6.5–8.1)

## 2022-08-11 LAB — LIPASE, BLOOD: Lipase: 38 U/L (ref 11–51)

## 2022-08-11 LAB — CBG MONITORING, ED: Glucose-Capillary: 223 mg/dL — ABNORMAL HIGH (ref 70–99)

## 2022-08-11 MED ORDER — ONDANSETRON HCL 4 MG/2ML IJ SOLN: 4.0000 mg | Freq: Four times a day (QID) | INTRAMUSCULAR | Status: DC | PRN

## 2022-08-11 MED ORDER — LORATADINE 10 MG PO TABS
10.0000 mg | ORAL_TABLET | Freq: Every day | ORAL | Status: DC
  Administered 2022-08-12: 10 mg via ORAL
  Filled 2022-08-11: qty 1

## 2022-08-11 MED ORDER — IOHEXOL 350 MG/ML SOLN
100.0000 mL | Freq: Once | INTRAVENOUS | Status: AC | PRN
  Administered 2022-08-11: 100 mL via INTRAVENOUS

## 2022-08-11 MED ORDER — MORPHINE SULFATE (PF) 2 MG/ML IV SOLN: 2.0000 mg | INTRAVENOUS | Status: DC | PRN

## 2022-08-11 MED ORDER — MAGNESIUM OXIDE -MG SUPPLEMENT 400 (240 MG) MG PO TABS
400.0000 mg | ORAL_TABLET | Freq: Every day | ORAL | Status: DC
  Administered 2022-08-12: 400 mg via ORAL
  Filled 2022-08-11: qty 1

## 2022-08-11 MED ORDER — NITROGLYCERIN 0.4 MG SL SUBL
0.4000 mg | SUBLINGUAL_TABLET | Freq: Once | SUBLINGUAL | Status: AC
  Administered 2022-08-11: 0.4 mg via SUBLINGUAL
  Filled 2022-08-11: qty 1

## 2022-08-11 MED ORDER — METOPROLOL TARTRATE 5 MG/5ML IV SOLN: 2.5000 mg | Freq: Once | INTRAVENOUS | Status: DC

## 2022-08-11 MED ORDER — ATORVASTATIN CALCIUM 20 MG PO TABS
80.0000 mg | ORAL_TABLET | Freq: Every evening | ORAL | Status: DC
  Administered 2022-08-11: 80 mg via ORAL
  Filled 2022-08-11: qty 4

## 2022-08-11 MED ORDER — METHOCARBAMOL 500 MG PO TABS
500.0000 mg | ORAL_TABLET | Freq: Three times a day (TID) | ORAL | Status: DC
  Administered 2022-08-11 – 2022-08-12 (×2): 500 mg via ORAL
  Filled 2022-08-11 (×4): qty 1

## 2022-08-11 MED ORDER — TAMSULOSIN HCL 0.4 MG PO CAPS
0.4000 mg | ORAL_CAPSULE | Freq: Every day | ORAL | Status: DC
  Administered 2022-08-12: 0.4 mg via ORAL
  Filled 2022-08-11: qty 1

## 2022-08-11 MED ORDER — RANOLAZINE ER 500 MG PO TB12
500.0000 mg | ORAL_TABLET | Freq: Two times a day (BID) | ORAL | Status: DC
  Administered 2022-08-12: 500 mg via ORAL
  Filled 2022-08-11: qty 1

## 2022-08-11 MED ORDER — ALUM & MAG HYDROXIDE-SIMETH 200-200-20 MG/5ML PO SUSP: 30.0000 mL | ORAL | Status: DC | PRN

## 2022-08-11 MED ORDER — INSULIN ASPART 100 UNIT/ML IJ SOLN
0.0000 [IU] | Freq: Every day | INTRAMUSCULAR | Status: DC
  Administered 2022-08-11: 2 [IU] via SUBCUTANEOUS
  Filled 2022-08-11: qty 1

## 2022-08-11 MED ORDER — INSULIN ASPART 100 UNIT/ML IJ SOLN
0.0000 [IU] | Freq: Three times a day (TID) | INTRAMUSCULAR | Status: DC
  Administered 2022-08-12: 7 [IU] via SUBCUTANEOUS
  Administered 2022-08-12: 11 [IU] via SUBCUTANEOUS
  Filled 2022-08-11 (×2): qty 1

## 2022-08-11 MED ORDER — MECLIZINE HCL 25 MG PO TABS: 25.0000 mg | ORAL_TABLET | Freq: Three times a day (TID) | ORAL | Status: DC | PRN

## 2022-08-11 MED ORDER — ACETAMINOPHEN 325 MG PO TABS: 650.0000 mg | ORAL_TABLET | ORAL | Status: DC | PRN

## 2022-08-11 MED ORDER — ASPIRIN 81 MG PO TBEC
81.0000 mg | DELAYED_RELEASE_TABLET | Freq: Every day | ORAL | Status: DC
  Administered 2022-08-12: 81 mg via ORAL
  Filled 2022-08-11: qty 1

## 2022-08-11 MED ORDER — METOPROLOL SUCCINATE ER 25 MG PO TB24
25.0000 mg | ORAL_TABLET | Freq: Two times a day (BID) | ORAL | Status: DC
  Administered 2022-08-11 – 2022-08-12 (×2): 25 mg via ORAL
  Filled 2022-08-11 (×2): qty 1

## 2022-08-11 MED ORDER — AMIODARONE HCL 200 MG PO TABS
200.0000 mg | ORAL_TABLET | Freq: Every day | ORAL | Status: DC
  Administered 2022-08-12: 200 mg via ORAL
  Filled 2022-08-11: qty 1

## 2022-08-11 MED ORDER — ISOSORBIDE MONONITRATE ER 30 MG PO TB24
15.0000 mg | ORAL_TABLET | Freq: Every day | ORAL | Status: DC
  Administered 2022-08-12: 15 mg via ORAL
  Filled 2022-08-11: qty 1

## 2022-08-11 MED ORDER — OXYCODONE HCL 5 MG PO TABS
5.0000 mg | ORAL_TABLET | ORAL | Status: DC | PRN
  Administered 2022-08-12: 5 mg via ORAL
  Filled 2022-08-11: qty 1

## 2022-08-11 MED ORDER — CYANOCOBALAMIN 500 MCG PO TABS
500.0000 ug | ORAL_TABLET | Freq: Every day | ORAL | Status: DC
  Administered 2022-08-12: 500 ug via ORAL
  Filled 2022-08-11: qty 1

## 2022-08-11 MED ORDER — FUROSEMIDE 40 MG PO TABS
40.0000 mg | ORAL_TABLET | Freq: Two times a day (BID) | ORAL | Status: DC
  Administered 2022-08-11 – 2022-08-12 (×2): 40 mg via ORAL
  Filled 2022-08-11 (×2): qty 1

## 2022-08-11 MED ORDER — FAMOTIDINE 20 MG PO TABS
20.0000 mg | ORAL_TABLET | Freq: Two times a day (BID) | ORAL | Status: DC
  Administered 2022-08-11 – 2022-08-12 (×2): 20 mg via ORAL
  Filled 2022-08-11 (×2): qty 1

## 2022-08-11 MED ORDER — ACETAMINOPHEN 500 MG PO TABS
1000.0000 mg | ORAL_TABLET | Freq: Once | ORAL | Status: AC
  Administered 2022-08-11: 1000 mg via ORAL
  Filled 2022-08-11: qty 2

## 2022-08-11 MED ORDER — MORPHINE SULFATE (PF) 4 MG/ML IV SOLN
4.0000 mg | Freq: Once | INTRAVENOUS | Status: AC
  Administered 2022-08-11: 4 mg via INTRAVENOUS
  Filled 2022-08-11: qty 1

## 2022-08-11 MED ORDER — APIXABAN 5 MG PO TABS
5.0000 mg | ORAL_TABLET | Freq: Two times a day (BID) | ORAL | Status: DC
  Administered 2022-08-11 – 2022-08-12 (×2): 5 mg via ORAL
  Filled 2022-08-11 (×2): qty 1

## 2022-08-11 MED ORDER — NITROGLYCERIN 0.4 MG SL SUBL: 0.4000 mg | SUBLINGUAL_TABLET | SUBLINGUAL | Status: DC | PRN

## 2022-08-11 MED ORDER — INSULIN ASPART PROT & ASPART (70-30 MIX) 100 UNIT/ML ~~LOC~~ SUSP
80.0000 [IU] | Freq: Two times a day (BID) | SUBCUTANEOUS | Status: DC
  Filled 2022-08-11: qty 10

## 2022-08-11 NOTE — ED Triage Notes (Signed)
Presents via EMS from home with some chest pain and back pain

## 2022-08-11 NOTE — ED Provider Notes (Signed)
Patient is a 77 year old male history of coronary artery disease, MDS prior aortic valve replacement A-fib who presents because of chest pain.  Patient has recent cath from June 17, 2022 which showed diffuse multivessel disease.  Follows with Dr. Saunders Revel.  Tells me he has chest pain nearly every day but this is worse he describes a stabbing atypical type pain on the right side of his chest as well as a dull aching in the left side of his chest.  He did take 4 aspirin prior to coming.  CTA was obtained is negative for dissection first troponin is 20 on repeat it is 19.  EKG does not have any specific changes to suggest ischemia.  On reassessment patient is still complaining of ongoing pain that is different from his usual and he would like to be admitted.  Given his risk factors I think it is reasonable to admit him for observation.  Will defer heparin at this time.   Rada Hay, MD 08/11/22 [phone]

## 2022-08-11 NOTE — ED Notes (Signed)
Provided patient his dinner tray.

## 2022-08-11 NOTE — ED Provider Notes (Signed)
[facility] Provider Note    Event Date/Time   First MD Initiated Contact with Patient 08/11/22 1109     (approximate)   History   Chest discomfort   HPI  Jonathan Easom Nesta Kimple. is a 77 y.o. male with a history of CAD, MDS, CHF, diabetes, aortic valve replacement, hypertension who presents with complaints of chest discomfort.  Patient reports he has had back pain with some radiation into his chest.  He reports he has nearly daily chest discomfort but today's is different.  Review of medical records demonstrates cardiac catheterization on June 17, 2022 which shows diffuse and significant disease     Physical Exam   Triage Vital Signs: ED Triage Vitals [08/11/22 1108]  Enc Vitals Group     BP (!) 164/71     Pulse Rate (!) 55     Resp 18     Temp 97.7 F (36.5 C)     Temp Source Oral     SpO2 98 %     Weight      Height      Head Circumference      Peak Flow      Pain Score      Pain Loc      Pain Edu?      Excl. in [place]?     Most recent vital signs: Vitals:   08/11/22 1200 08/11/22 1230  BP: (!) 156/62 (!) 167/71  Pulse: (!) 52 (!) 54  Resp: 14 14  Temp:    SpO2: 96% 94%     General: Awake, no distress.  CV:  Good peripheral perfusion.  Normal rate Resp:  Normal effort.  Clear to auscultation bilaterally Abd:  No distention.  Other:  Minimal edema bilaterally, no calf pain or tenderness   ED Results / Procedures / Treatments   Labs (all labs ordered are listed, but only abnormal results are displayed) Labs Reviewed  CBC - Abnormal; Notable for the following components:      Result Value   WBC 3.2 (*)    RBC 3.03 (*)    Hemoglobin 11.1 (*)    HCT 34.2 (*)    MCV 112.9 (*)    MCH 36.6 (*)    Platelets 126 (*)    All other components within normal limits  COMPREHENSIVE METABOLIC PANEL - Abnormal; Notable for the following components:   Sodium 132 (*)    Chloride 97 (*)    Glucose, Bld 226 (*)    All other components  within normal limits  TROPONIN I (HIGH SENSITIVITY) - Abnormal; Notable for the following components:   Troponin I (High Sensitivity) 20 (*)    All other components within normal limits  TROPONIN I (HIGH SENSITIVITY)     EKG  ED ECG REPORT I, Lavonia Drafts, the attending physician, personally viewed and interpreted this ECG.  Date: 08/11/2022  Rhythm: normal sinus rhythm QRS Axis: normal Intervals: normal ST/T Wave abnormalities: Nonspecific T wave changes Narrative Interpretation: no evidence of acute ischemia    RADIOLOGY Chest x-ray viewed interpret by me, no acute abnormality    PROCEDURES:  Critical Care performed:   Procedures   MEDICATIONS ORDERED IN ED: Medications  iohexol (OMNIPAQUE) 350 MG/ML injection 100 mL (100 mLs Intravenous Contrast Given 08/11/22 1354)  nitroGLYCERIN (NITROSTAT) SL tablet 0.4 mg (0.4 mg Sublingual Given 08/11/22 1507)  acetaminophen (TYLENOL) tablet 1,000 mg (1,000 mg Oral Given 08/11/22 1506)     IMPRESSION / MDM / ASSESSMENT  AND PLAN / ED COURSE  I reviewed the triage vital signs and the nursing notes. Patient's presentation is most consistent with acute presentation with potential threat to life or bodily function.  Patient presents with chest discomfort as detailed above.  Different quality of chest pain and radiating from back to chest differential includes ACS, dissection, angina, pneumonia  Lab work reviewed and is overall reassuring, high sensitive troponin is 20 which is lower than his baseline.  Chest x-ray is reassuring, no evidence of pneumonia.  Will send for CT angiography to rule out dissection  CT angiography is reassuring  Patient is feeling improved, treated with Tylenol and nitroglycerin  Pending second troponin  Asked my colleague to follow-up on second troponin of stable appropriate for discharge if patient continues feeling improved    Clinical Course as of 08/11/22 1509  Mon Aug 11, 2022  1254 DG  Chest [place] 1 View [RK]    Clinical Course User Index [RK] Lavonia Drafts, MD     FINAL CLINICAL IMPRESSION(S) / ED DIAGNOSES   Final diagnoses:  Chest pain, unspecified type     Rx / DC Orders   ED Discharge Orders     None        Note:  This document was prepared using Dragon voice recognition software and may include unintentional dictation errors.   Lavonia Drafts, MD 08/11/22 [phone]

## 2022-08-11 NOTE — H&P (Addendum)
History and Physical    Jonathan Cross. ZES:[id number] DOB: [birth year] DOA: 08/11/2022  PCP: Atilano Ina, MD  Patient coming from: Home  I have personally briefly reviewed patient's old medical records in [place]  Chief Complaint: Chest Pain  HPI: Jonathan Cross. is a 77 y.o. male with medical history significant of type 2 diabetes mellitus on chronic insulin, myelodysplastic syndrome, CAD, history of bioprosthetic AVR, HFpEF, morbid obesity, hyperlipidemia, hypertension, paroxysmal atrial fibrillation who presents for evaluation of chest pain.  Patient has known CAD and had a recent catheterization 4 weeks prior to presentation with clean coronary arteries.  Patient states he always has chest pain however felt that his chest pain was acutely worse on the day of presentation.  Initial workup in the emergency room was overall unrevealing.  EKG nonischemic.  Troponins flat x 2.  Blood pressure reasonably well-controlled.  Patient received morphine and sublingual nitroglycerin with minimal relief.  Continues to endorse chest pain.  Given high risk nature of patient emergency department called hospitalist for overnight observation for high risk chest pain.  ED Course: EKG performed on admission was nonischemic.  Troponins negative x 2.  Overall laboratory workup stable.  Patient continues to endorse chest pain despite treatment with morphine and sublingual nitroglycerin.  Hospitalist contacted for admission.  Review of Systems: As per HPI otherwise 14 point review of systems negative.    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 04/2022 Echo: EF 60-65%, no rwma, GrII DD, nl RV fxn, mildly dil LA, mild MR, nl fxn'ing biopros AoV.   Coronary artery disease    a. 01/2016 CABG x 2: LIMA->LAD, VG->OM; b. 05/2021 MV: small, mild, rev apical lateral and apical inf defects  ->subtle ischemia vs artifact, EF 55-65%-->low risk.   Diabetes ([place])    H/O aortic valve replacement    a. 01/2016 s/p bioprosthetic AVR @ [facility] for Severe AS; b. 05/2021 Echo: EF 55-60%, nl fxn of bioprosthetic AVR; c. 04/2022 Echo: EF 60-65%, nl fxn'ing AoV.   History of kidney stones    Hx of CABG    Hyperlipidemia    Hypertension    MDS (myelodysplastic syndrome), low grade ([place]) 04/2022   Myelodysplastic syndrome ([place]) 05/2022   PAF (paroxysmal atrial fibrillation) (HCC)    a. CHA2DS2VASc = 5-->eliquis/amio.   Sleep apnea     Past Surgical History:  Procedure Laterality Date   AORTIC VALVE REPLACEMENT  2017   [facility], bioprosthetic   APPENDECTOMY     BONE MARROW BIOPSY     05/2022   CARDIAC VALVE REPLACEMENT     CORONARY ARTERY BYPASS GRAFT  2017   [facility] - LIMA-LAD and SVG-OM   CYSTOSCOPY W/ RETROGRADES Right 05/27/2019   Procedure: CYSTOSCOPY WITH RETROGRADE PYELOGRAM;  Surgeon: Billey Co, MD;  Location: [facility];  Service: Urology;  Laterality: Right;   CYSTOSCOPY W/ RETROGRADES Right 07/11/2019   Procedure: CYSTOSCOPY WITH RETROGRADE PYELOGRAM;  Surgeon: Billey Co, MD;  Location: [facility];  Service: Urology;  Laterality: Right;   CYSTOSCOPY/URETEROSCOPY/HOLMIUM LASER/STENT PLACEMENT Right 05/27/2019   Procedure: CYSTOSCOPY/URETEROSCOPY/HOLMIUM LASER/STENT PLACEMENT;  Surgeon: Billey Co, MD;  Location: [facility];  Service: Urology;  Laterality: Right;   CYSTOSCOPY/URETEROSCOPY/HOLMIUM LASER/STENT PLACEMENT Right 06/17/2019   Procedure: CYSTOSCOPY/URETEROSCOPY/HOLMIUM LASER/STENT Exchange;  Surgeon: Billey Co, MD;  Location: [facility];  Service: Urology;  Laterality: Right;   CYSTOSCOPY/URETEROSCOPY/HOLMIUM  LASER/STENT PLACEMENT Right 07/11/2019   Procedure: CYSTOSCOPY/URETEROSCOPY/HOLMIUM LASER/STENT Exchange;  Surgeon: Billey Co, MD;  Location: [facility];  Service: Urology;  Laterality: Right;   RIGHT HEART CATH AND CORONARY/GRAFT ANGIOGRAPHY  Bilateral 06/17/2022   Procedure: RIGHT HEART CATH AND CORONARY/GRAFT ANGIOGRAPHY;  Surgeon: Nelva Bush, MD;  Location: [facility];  Service: Cardiovascular;  Laterality: Bilateral;   STONE EXTRACTION WITH BASKET Right 07/11/2019   Procedure: STONE EXTRACTION WITH BASKET;  Surgeon: Billey Co, MD;  Location: [facility];  Service: Urology;  Laterality: Right;   TEE WITHOUT CARDIOVERSION N/A 04/21/2019   Procedure: TRANSESOPHAGEAL ECHOCARDIOGRAM (TEE);  Surgeon: Minna Merritts, MD;  Location: [facility];  Service: Cardiovascular;  Laterality: N/A;   TEE WITHOUT CARDIOVERSION N/A 07/04/2019   Procedure: TRANSESOPHAGEAL ECHOCARDIOGRAM (TEE);  Surgeon: Wellington Hampshire, MD;  Location: [facility];  Service: Cardiovascular;  Laterality: N/A;   TEE WITHOUT CARDIOVERSION N/A 08/17/2019   Procedure: TRANSESOPHAGEAL ECHOCARDIOGRAM (TEE);  Surgeon: Kate Sable, MD;  Location: [facility];  Service: Cardiovascular;  Laterality: N/A;   TEE WITHOUT CARDIOVERSION N/A 11/15/2019   Procedure: TRANSESOPHAGEAL ECHOCARDIOGRAM (TEE);  Surgeon: Nelva Bush, MD;  Location: [facility];  Service: Cardiovascular;  Laterality: N/A;   TONSILLECTOMY       reports that he has quit smoking. He has never used smokeless tobacco. He reports that he does not drink alcohol and does not use drugs.  Allergies  Allergen Reactions   Metformin Diarrhea    Loose stools even with XR    Family History  Problem Relation Age of Onset   Heart attack Father 44   Heart attack Sister      Prior to Admission medications   Medication Sig Start Date End Date Taking? Authorizing Provider  amiodarone (PACERONE) 200 MG tablet Take 1 tablet by mouth once daily 07/16/22   End, Harrell Gave, MD  apixaban (ELIQUIS) 5 MG TABS tablet Take 5 mg by mouth 2 (two) times daily.    [provider]  aspirin EC 81 MG tablet Take 81 mg by mouth daily. Swallow whole.    [provider]  atorvastatin (LIPITOR) 80  MG tablet Take 1 tablet (80 mg total) by mouth every evening. 11/01/21   Theora Gianotti, NP  furosemide (LASIX) 40 MG tablet Take 1 tablet (40 mg total) by mouth 2 (two) times daily. 06/23/22 06/23/23  End, Harrell Gave, MD  insulin NPH-regular Human (70-30) 100 UNIT/ML injection Inject 120 Units into the skin 2 (two) times daily.    [provider]  isosorbide mononitrate (IMDUR) 30 MG 24 hr tablet Take 0.5 tablets (15 mg total) by mouth daily. 11/01/21   Theora Gianotti, NP  loratadine (CLARITIN) 10 MG tablet Take 10 mg by mouth daily. 09/13/18   [provider]  Magnesium Oxide 400 MG CAPS Take 1 capsule (400 mg total) by mouth daily. 11/01/21   Theora Gianotti, NP  meclizine (ANTIVERT) 25 MG tablet Take 1 tablet (25 mg total) by mouth 3 (three) times daily as needed for dizziness. 09/13/21   Shelly Coss, MD  metoprolol succinate (TOPROL-XL) 25 MG 24 hr tablet Take 25 mg by mouth 2 (two) times daily.    [provider]  nitroGLYCERIN (NITROSTAT) 0.4 MG SL tablet Place 0.4 mg under the tongue every 5 (five) minutes x 3 doses as needed for chest pain.  03/24/18   [provider]  ranolazine (RANEXA) 500 MG 12 hr tablet Take 1 tablet (500 mg total) by mouth 2 (  two) times daily. 04/03/22   Furth, Cadence H, PA-C  tamsulosin (FLOMAX) 0.4 MG CAPS capsule Take 0.4 mg by mouth daily. 03/06/21   [provider]  vitamin B-12 (CYANOCOBALAMIN) 500 MCG tablet Take 1 tablet (500 mcg total) by mouth daily. 06/16/22   Earlie Server, MD    Physical Exam: Vitals:   08/11/22 1230 08/11/22 1530 08/11/22 1600 08/11/22 1630  BP: (!) 167/71 (!) 147/77 (!) 146/58 (!) 146/69  Pulse: (!) 54 (!) 56 (!) 54 (!) 55  Resp: '14 13 19 17  '$ Temp:      TempSrc:      SpO2: 94% 94% 99% 99%     Vitals:   08/11/22 1230 08/11/22 1530 08/11/22 1600 08/11/22 1630  BP: (!) 167/71 (!) 147/77 (!) 146/58 (!) 146/69  Pulse: (!) 54 (!) 56 (!) 54 (!) 55  Resp: '14 13 19  17  '$ Temp:      TempSrc:      SpO2: 94% 94% 99% 99%   General: No acute distress.  Appears fatigued HEENT: Normocephalic, atraumatic Neck, supple, trachea midline, no tenderness Heart: Distant heart sounds, subtle murmur, no pedal edema Lungs: Bibasilar rales.  Normal work of breathing.  Room air Abdomen: Obese, NT/ND, normal bowel sounds Extremities: Normal, atraumatic, no clubbing or cyanosis, normal muscle tone Skin: No rashes or lesions, normal color Neurologic: Cranial nerves grossly intact, sensation intact, alert and oriented x3 Psychiatric: Normal affect   Labs on Admission: I have personally reviewed following labs and imaging studies  CBC: Recent Labs  Lab 08/11/22 1138  WBC 3.2*  HGB 11.1*  HCT 34.2*  MCV 112.9*  PLT 735*   Basic Metabolic Panel: Recent Labs  Lab 08/11/22 1138  NA 132*  K 4.3  CL 97*  CO2 27  GLUCOSE 226*  BUN 21  CREATININE 1.10  CALCIUM 9.1   GFR: CrCl cannot be calculated (Unknown ideal weight.). Liver Function Tests: Recent Labs  Lab 08/11/22 1138  AST 22  ALT 23  ALKPHOS 49  BILITOT 0.9  PROT 8.0  ALBUMIN 4.1   No results for input(s): "LIPASE", "AMYLASE" in the last 168 hours. No results for input(s): "AMMONIA" in the last 168 hours. Coagulation Profile: No results for input(s): "INR", "PROTIME" in the last 168 hours. Cardiac Enzymes: No results for input(s): "CKTOTAL", "CKMB", "CKMBINDEX", "TROPONINI" in the last 168 hours. BNP (last 3 results) No results for input(s): "PROBNP" in the last 8760 hours. HbA1C: No results for input(s): "HGBA1C" in the last 72 hours. CBG: No results for input(s): "GLUCAP" in the last 168 hours. Lipid Profile: No results for input(s): "CHOL", "HDL", "LDLCALC", "TRIG", "CHOLHDL", "LDLDIRECT" in the last 72 hours. Thyroid Function Tests: No results for input(s): "TSH", "T4TOTAL", "FREET4", "T3FREE", "THYROIDAB" in the last 72 hours. Anemia Panel: No results for input(s): "VITAMINB12",  "FOLATE", "FERRITIN", "TIBC", "IRON", "RETICCTPCT" in the last 72 hours. Urine analysis:    Component Value Date/Time   COLORURINE YELLOW (A) 05/01/2022 1711   APPEARANCEUR CLOUDY (A) 05/01/2022 1711   APPEARANCEUR Cloudy (A) 06/07/2019 1409   LABSPEC 1.012 05/01/2022 1711   PHURINE 5.0 05/01/2022 1711   GLUCOSEU NEGATIVE 05/01/2022 1711   HGBUR SMALL (A) 05/01/2022 1711   BILIRUBINUR NEGATIVE 05/01/2022 1711   BILIRUBINUR Negative 06/07/2019 1409   KETONESUR NEGATIVE 05/01/2022 1711   PROTEINUR 30 (A) 05/01/2022 1711   NITRITE NEGATIVE 05/01/2022 1711   LEUKOCYTESUR LARGE (A) 05/01/2022 1711    Radiological Exams on Admission: CT Angio Chest/Abd/Pel for Dissection W and/or  Wo Contrast  Result Date: 08/11/2022 CLINICAL DATA:  Chest pain EXAM: CT ANGIOGRAPHY CHEST, ABDOMEN AND PELVIS TECHNIQUE: Non-contrast CT of the chest was initially obtained. Multidetector CT imaging through the chest, abdomen and pelvis was performed using the standard protocol during bolus administration of intravenous contrast. Multiplanar reconstructed images and MIPs were obtained and reviewed to evaluate the vascular anatomy. RADIATION DOSE REDUCTION: This exam was performed according to the departmental dose-optimization program which includes automated exposure control, adjustment of the mA and/or kV according to patient size and/or use of iterative reconstruction technique. CONTRAST:  167m OMNIPAQUE IOHEXOL 350 MG/ML SOLN COMPARISON:  CT abdomen and pelvis dated October 19, 2018; chest CT dated April 17, 2019 FINDINGS: CTA CHEST FINDINGS Cardiovascular: Normal heart size. No pericardial effusion. Normal caliber thoracic aorta with moderate atherosclerotic disease. Severe coronary artery calcifications status post CABG. Prior aortic valve replacement. No suspicious filling defects of the central pulmonary arteries. Mediastinum/Nodes: Small hiatal hernia. Thyroid is unremarkable. No pathologically enlarged lymph nodes  seen in the chest. Lungs/Pleura: Central airways are patent. No consolidation, pleural effusion or pneumothorax. Mild bibasilar atelectasis. New ground-glass nodule of the right upper lobe measuring 6 mm on series 6, image 76, not present on prior chest CT. Musculoskeletal: Prior median sternotomy with intact sternal wires. No aggressive appearing osseous lesions. Review of the MIP images confirms the above findings. CTA ABDOMEN AND PELVIS FINDINGS VASCULAR Aorta: Normal caliber aorta without aneurysm, dissection, vasculitis or significant stenosis. Moderate atherosclerotic disease with ulcerated soft plaque of the infrarenal abdominal aorta. Celiac: Patent without evidence of aneurysm, dissection, or vasculitis. Moderate narrowing at the origin due to diaphragmatic compression and calcified plaque. SMA: Patent without evidence of aneurysm, dissection, vasculitis or significant stenosis. Renals: Single right and two left renal arteries without evidence of aneurysm, dissection, vasculitis, fibromuscular dysplasia or significant stenosis. IMA: Patent without evidence of aneurysm, dissection, vasculitis or significant stenosis. Inflow: Patent without evidence of aneurysm, dissection, vasculitis or significant stenosis. Veins: No obvious venous abnormality within the limitations of this arterial phase study. Review of the MIP images confirms the above findings. NON-VASCULAR Hepatobiliary: No focal liver abnormality. Gallstones with no evidence of gallbladder wall thickening. No biliary ductal dilation. Pancreas: Unremarkable. No pancreatic ductal dilatation or surrounding inflammatory changes. Spleen: Normal in size without focal abnormality. Adrenals/Urinary Tract: Bilateral adrenal glands are unremarkable. No hydronephrosis. Hyperdense of the upper pole of the right kidney measuring 7 mm on series 5, image 114, unchanged when compared with prior exam and likely a proteinaceous cyst. Additional bilateral  low-attenuation renal lesions, largest are compatible with simple cysts, others are small to completely characterize. No specific follow-up imaging is recommended for renal lesions. Stomach/Bowel: Stomach is within normal limits. No evidence of bowel wall thickening, distention, or inflammatory changes. Lymphatic: No pathologically enlarged lymph nodes seen in the abdomen or pelvis. Reproductive: Prostate is unremarkable. Other: No abdominal wall hernia or abnormality. No abdominopelvic ascites. Musculoskeletal: No acute or significant osseous findings. Review of the MIP images confirms the above findings. IMPRESSION: 1. No evidence of acute aortic syndrome. 2. No acute findings in the chest, abdomen or pelvis. 3. New 6 mm right upper lobe ground-glass nodule. Initial follow-up with CT at 6 months is recommended to confirm persistence. If persistent, repeat CT is recommended every 2 years until 5 years of stability has been established. This recommendation follows the consensus statement: Guidelines for Management of Incidental Pulmonary Nodules Detected on CT Images: From the Fleischner Society 2017; Radiology 2017; 284:228-243. 4. Aortic Atherosclerosis (ICD10-I70.0).  Electronically Signed   By: Yetta Glassman M.D.   On: 08/11/2022 14:24   DG Chest Port 1 View  Result Date: 08/11/2022 CLINICAL DATA:  Provided history: Chest pain. Additional provided: Chest and back pain. History of CAD, diabetes, hypertension. Prior CABG in 2017. Former smoker. EXAM: PORTABLE CHEST 1 VIEW COMPARISON:  Prior chest radiographs 05/01/2022 and earlier. FINDINGS: Prior median sternotomy/CABG. Cardiomegaly. Aortic atherosclerosis. Minimal atelectasis within both lung bases. No appreciable airspace consolidation or pulmonary edema. No evidence of pleural effusion or pneumothorax. Degenerative changes of the right acromioclavicular joint. IMPRESSION: 1. No evidence of airspace consolidation or pulmonary edema. 2. Minimal atelectasis  within the bilateral lung bases. 3. Cardiomegaly. 4.  Aortic Atherosclerosis (ICD10-I70.0). Electronically Signed   By: Kellie Simmering D.O.   On: 08/11/2022 11:46    EKG: Independently reviewed.  Sinus rhythm with first-degree AV block  Assessment/Plan Principal Problem:   Chest pain Active Problems:   Coronary artery disease of native artery of native heart with stable angina pectoris (HCC)   Class 2 severe obesity due to excess calories with serious comorbidity and body mass index (BMI) of 38.0 to 38.9 in adult [facility])   Essential hypertension   OSA (obstructive sleep apnea)   S/P aortic valve replacement   Status post coronary artery bypass graft   Type 2 diabetes mellitus without complication, with long-term current use of insulin (HCC)   Hyperlipidemia LDL goal <70   GERD (gastroesophageal reflux disease)   Atrial fibrillation with RVR (HCC)   Pancytopenia (HCC)   Accelerating angina (HCC)   Myelodysplastic syndrome ([place])  Atypical chest pain Unclear nature of chest pain.  At this point considering overall reassuring workup I doubt this is truly cardiac in nature.  Patient also had a recent catheterization with multivessel coronary disease but no lesions that required PCI approximately 4 weeks prior to presentation.  Despite this patient continues to endorse chest pain that is not responding to morphine or nitroglycerin.  We will admit overnight for observation. Plan: Place in observation Telemetry monitoring EKG as needed chest pain Scheduled Robaxin for possible musculoskeletal pain Twice daily Pepcid and as needed Mylanta for GERD related chest pain If stable anticipate discharge on 1/30  Coronary artery disease Patient has constant chest pain and dyspnea on exertion.  His body habitus is certainly contributing.  Left and right heart catheterization on 12/5.  Per cardiology recommend secondary prevention of CAD including antianginal therapy with aspirin, high intensity statin,  Imdur, Ranexa  Heart failure preserved ejection fraction Difficult to ascertain however patient does not appear volume overloaded.  Continue home furosemide 40 mg p.o. daily  Paroxysmal atrial fibrillation Chronic anticoagulation In sinus rhythm on presentation today.  Continue home amiodarone 200 mg daily.  Continue apixaban 5 mg twice daily for CHA2DS2-VASc of at least 6  Severe aortic stenosis status post bioprosthetic aortic valve replacement Per cardiology recent echo with appropriate function of valve.  Does have a history of previous bacterial endocarditis.  Myelodysplastic syndrome Pancytopenia Recently diagnosed.  Follows with oncology.  Per oncology recommendations recommended to keep hemoglobin greater than 8.  On presentation today hemoglobin 11.  No indication for transfusion.  Essential hypertension Blood pressure well-controlled.  Resume home regimen  Hyperlipidemia High intensity statin  Type 2 diabetes mellitus on chronic insulin Hemoglobin A1c of 8.6 in November 2023.  Followed by PCP.  On very high-dose insulin therapy.  Will resume approximately two thirds of home insulin dose.  Resistant sliding scale.  Accu-Cheks before meals  and at bedtime.  Obstructive sleep apnea BiPAP at night  Morbid obesity This complicates overall care and prognosis  DVT prophylaxis: Eliquis Code Status: Full Family Communication: Daughter via phone 1/29 Disposition Plan: Anticipate return to previous home environment Consults called: None Admission status: Observation, cardiac telemetry   Sidney Ace MD [facility]   If 7PM-7AM, please contact night-coverage   08/11/2022, 5:06 PM

## 2022-08-12 ENCOUNTER — Ambulatory Visit: Payer: Medicare HMO | Admitting: Cardiology

## 2022-08-12 ENCOUNTER — Inpatient Hospital Stay: Payer: Medicare HMO

## 2022-08-12 DIAGNOSIS — R079 Chest pain, unspecified: Secondary | ICD-10-CM | POA: Diagnosis not present

## 2022-08-12 DIAGNOSIS — D469 Myelodysplastic syndrome, unspecified: Secondary | ICD-10-CM

## 2022-08-12 LAB — CBG MONITORING, ED
Glucose-Capillary: 227 mg/dL — ABNORMAL HIGH (ref 70–99)
Glucose-Capillary: 289 mg/dL — ABNORMAL HIGH (ref 70–99)

## 2022-08-12 MED ORDER — INSULIN ASPART PROT & ASPART (70-30 MIX) 100 UNIT/ML ~~LOC~~ SUSP
40.0000 [IU] | Freq: Two times a day (BID) | SUBCUTANEOUS | Status: DC
  Administered 2022-08-12: 40 [IU] via SUBCUTANEOUS

## 2022-08-12 MED ORDER — NITROGLYCERIN 0.4 MG SL SUBL: 0.4000 mg | SUBLINGUAL_TABLET | SUBLINGUAL | 1 refills | Status: AC | PRN

## 2022-08-12 MED ORDER — METHOCARBAMOL 500 MG PO TABS: 500.0000 mg | ORAL_TABLET | Freq: Three times a day (TID) | ORAL | 0 refills | Status: DC | PRN

## 2022-08-12 NOTE — Progress Notes (Signed)
Brief update note.  Full note to follow.  77 year old male with extensive cardiac history, status post bioprosthetic AVR who presents for evaluation of acute on chronic chest pain.  Patient states he always has chest pain but this particular time the pain was worse than prior.  No indication this is cardiac in nature.  Troponins flat.  EKG nonischemic.  Chest pain has improved this morning.  Patient had a recent catheterization early December with multivessel coronary artery disease.  Cardiology recommending aggressive medical management at that time.  Cardiology consultation called for further recommendations.  If no plans for ischemic evaluation and chest pain resolved anticipate discharge home this afternoon.  Ralene Muskrat MD  No charge

## 2022-08-12 NOTE — ED Notes (Signed)
Patient states that the sharp pain is back..Dr. Priscella Mann notified.

## 2022-08-12 NOTE — Consult Note (Signed)
Cardiology Consultation:   Patient ID: Jonathan Cross.; [id number]; October 04, 1945   Admit date: 08/11/2022 Date of Consult: 08/12/2022  Primary Care Provider: Atilano Ina, MD Primary Cardiologist: End Primary Electrophysiologist:  None   Patient Profile:   Jonathan Cross. is a 77 y.o. male with a hx of CAD status post two-vessel CABG (LIMA to LAD, SVG to OM), severe aortic stenosis status post bioprosthetic AVR complicated by endocarditis, PAF on apixaban, HFpEF, MDS, DM2, HTN, HLD, obesity, and sleep apnea who is being seen today for the evaluation of chest pain at the request of Dr. Priscella Mann.  History of Present Illness:   Mr. Sweetin was previously followed by [facility] in the setting of severe aortic stenosis, he underwent two-vessel CABG and bioprosthetic AVR in 01/2016.  Between 05/03/2019 and 12/01/2019, he had multiple admissions related to fever, malaise, and Enterococcus bacteremia.  TEE in 05/03/2019 and 07/03/2019 were negative for vegetation.  In 08/2019, he was found to have a mobile mass on his prosthetic aortic valve and was seen by ID with treatment with appropriate antibiotics for 6 weeks in the outpatient setting.  Prior to PICC removal, he had repeat TEE in 11/2019 which continued to show an echodensity that appeared less mobile but larger measuring 1.1 x 0.6 cm.  Perivalvular abscess was unable to be excluded.  He was transferred to [facility] with cardiac CT showing evidence of a small mass in the leaflet of the noncoronary cusp, likely representing a healed vegetation.  Leaflets were freely mobile without evidence of obstruction.  There was evidence of a small pseudoaneurysm measuring 5.7 mm x 4.6 mm of the noncoronary cusp consistent with aortic root abscess.  Both grafts were visualized and patent.  CT surgery evaluated the patient and recommended continued observation and antibiotic therapy.  In 05/2021, he was admitted with dyspnea and chest fullness, found to be in  A-fib with RVR with conversion to sinus rhythm with IV amiodarone.  He underwent stress testing in the setting of chest pain which showed a small mild reversible apical lateral and anterior inferior defect with question of subtle ischemia versus infarct.  Ultimately, this was felt to be a low risk study and he was medically managed.  He has subsequently had multiple ED visits between multiple institutions for chest pain.  He was hospitalized in 04/2022 with mildly elevated high-sensitivity troponin that was flat trending and felt to be related to anemia with a hemoglobin of 8 requiring 1 unit PRBC.  Echo showed preserved LV systolic function with normal bioprosthetic aortic valve function.  Apixaban was held with plans for patient to follow-up with hematology.  He then presented to [facility] ED in late 04/2022 with recurrent chest pain with normal troponins and hemoglobin improved to 10.  Cardiac cath was discussed, though ultimately deferred pending workup of anemia.  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  In follow-up, due to continued marked fatigue and intermittent chest discomfort he underwent R/LHC on 06/17/2022 which revealed significant multivessel CAD including 80% ostial LAD followed by a sequential 50 to 60% mid LAD lesions, 50% proximal LCx disease, and 70 to 80% stenosis of the proximal segment of RPL 1.  Widely patent LIMA to LAD and SVG to OM 2.  Moderately elevated left heart filling pressures, mildly elevated right heart filling pressure and pulmonary artery pressures with normal cardiac output/index.  There was no indication for intervention with continued medical management recommended.  He has continued to have ongoing fatigue  and shortness of breath with intermittent atypical chest pain.  He was admitted to [facility] on 08/11/2022 after waking up and turning on the TV, developing right scapular pain followed by 20-30 brief episodes of right pinpoint sharp chest discomfort.  He has  continued to report ongoing shortness of breath and fatigue that is unchanged.  High-sensitivity troponin 20 with a delta troponin of 19.  Lipase normal.  Hgb improved to 11.1.  Potassium 4.3, BUN 21, serum creatinine 1.1.  Chest x-ray showed no evidence of airspace consolidation or pulmonary edema with minimal atelectasis within the bilateral lung bases.  CTA chest/abdomen/pelvis showed no evidence of acute aortic syndrome with no acute findings in the chest, abdomen, or pelvis.  A new 6 mm right upper lobe groundglass nodule was identified with recommendation by radiology to follow-up in 6 months.  Nominal ultrasound showed cholelithiasis without sonographic evidence of acute cholecystitis.  Patient is currently without frank chest pain.  Continues to note longstanding fatigue that is unchanged.    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 04/2022 Echo: EF 60-65%, no rwma, GrII DD, nl RV fxn, mildly dil LA, mild MR, nl fxn'ing biopros AoV.   Coronary artery disease    a. 01/2016 CABG x 2: LIMA->LAD, VG->OM; b. 05/2021 MV: small, mild, rev apical lateral and apical inf defects ->subtle ischemia vs artifact, EF 55-65%-->low risk.   Diabetes ([place])    H/O aortic valve replacement    a. 01/2016 s/p bioprosthetic AVR @ [facility] for Severe AS; b. 05/2021 Echo: EF 55-60%, nl fxn of bioprosthetic AVR; c. 04/2022 Echo: EF 60-65%, nl fxn'ing AoV.   History of kidney stones    Hx of CABG    Hyperlipidemia    Hypertension    MDS (myelodysplastic syndrome), low grade ([place]) 04/2022   Myelodysplastic syndrome ([place]) 05/2022   PAF (paroxysmal atrial fibrillation) ([facility])    a. CHA2DS2VASc = 5-->eliquis/amio.   Sleep apnea     Past Surgical History:  Procedure Laterality Date   AORTIC VALVE REPLACEMENT  2017   [facility], bioprosthetic   APPENDECTOMY     BONE MARROW BIOPSY     05/2022   CARDIAC VALVE REPLACEMENT      CORONARY ARTERY BYPASS GRAFT  2017   [facility] - LIMA-LAD and SVG-OM   CYSTOSCOPY W/ RETROGRADES Right 05/27/2019   Procedure: CYSTOSCOPY WITH RETROGRADE PYELOGRAM;  Surgeon: Billey Co, MD;  Location: [facility];  Service: Urology;  Laterality: Right;   CYSTOSCOPY W/ RETROGRADES Right 07/11/2019   Procedure: CYSTOSCOPY WITH RETROGRADE PYELOGRAM;  Surgeon: Billey Co, MD;  Location: [facility];  Service: Urology;  Laterality: Right;   CYSTOSCOPY/URETEROSCOPY/HOLMIUM LASER/STENT PLACEMENT Right 05/27/2019   Procedure: CYSTOSCOPY/URETEROSCOPY/HOLMIUM LASER/STENT PLACEMENT;  Surgeon: Billey Co, MD;  Location: [facility];  Service: Urology;  Laterality: Right;   CYSTOSCOPY/URETEROSCOPY/HOLMIUM LASER/STENT PLACEMENT Right 06/17/2019   Procedure: CYSTOSCOPY/URETEROSCOPY/HOLMIUM LASER/STENT Exchange;  Surgeon: Billey Co, MD;  Location: [facility];  Service: Urology;  Laterality: Right;   CYSTOSCOPY/URETEROSCOPY/HOLMIUM LASER/STENT PLACEMENT Right 07/11/2019   Procedure: CYSTOSCOPY/URETEROSCOPY/HOLMIUM LASER/STENT Exchange;  Surgeon: Billey Co, MD;  Location: [facility];  Service: Urology;  Laterality: Right;   RIGHT HEART CATH AND CORONARY/GRAFT ANGIOGRAPHY Bilateral 06/17/2022   Procedure: RIGHT HEART CATH AND CORONARY/GRAFT ANGIOGRAPHY;  Surgeon: Nelva Bush, MD;  Location: [facility];  Service: Cardiovascular;  Laterality: Bilateral;   STONE EXTRACTION WITH BASKET Right  07/11/2019   Procedure: STONE EXTRACTION WITH BASKET;  Surgeon: Billey Co, MD;  Location: [facility];  Service: Urology;  Laterality: Right;   TEE WITHOUT CARDIOVERSION N/A 04/21/2019   Procedure: TRANSESOPHAGEAL ECHOCARDIOGRAM (TEE);  Surgeon: Minna Merritts, MD;  Location: [facility];  Service: Cardiovascular;  Laterality: N/A;   TEE WITHOUT CARDIOVERSION N/A 07/04/2019   Procedure: TRANSESOPHAGEAL ECHOCARDIOGRAM (TEE);  Surgeon: Wellington Hampshire, MD;  Location: [facility];  Service:  Cardiovascular;  Laterality: N/A;   TEE WITHOUT CARDIOVERSION N/A 08/17/2019   Procedure: TRANSESOPHAGEAL ECHOCARDIOGRAM (TEE);  Surgeon: Kate Sable, MD;  Location: [facility];  Service: Cardiovascular;  Laterality: N/A;   TEE WITHOUT CARDIOVERSION N/A 11/15/2019   Procedure: TRANSESOPHAGEAL ECHOCARDIOGRAM (TEE);  Surgeon: Nelva Bush, MD;  Location: [facility];  Service: Cardiovascular;  Laterality: N/A;   TONSILLECTOMY       Home Meds: Prior to Admission medications   Medication Sig Start Date End Date Taking? Authorizing Provider  amiodarone (PACERONE) 200 MG tablet Take 1 tablet by mouth once daily 07/16/22  Yes End, Harrell Gave, MD  apixaban (ELIQUIS) 5 MG TABS tablet Take 5 mg by mouth 2 (two) times daily.   Yes [provider]  aspirin EC 81 MG tablet Take 81 mg by mouth daily. Swallow whole.   Yes [provider]  atorvastatin (LIPITOR) 80 MG tablet Take 1 tablet (80 mg total) by mouth every evening. 11/01/21  Yes Theora Gianotti, NP  furosemide (LASIX) 40 MG tablet Take 1 tablet (40 mg total) by mouth 2 (two) times daily. 06/23/22 06/23/23 Yes End, Harrell Gave, MD  insulin NPH-regular Human (70-30) 100 UNIT/ML injection Inject 130 Units into the skin 2 (two) times daily.   Yes [provider]  isosorbide mononitrate (IMDUR) 30 MG 24 hr tablet Take 0.5 tablets (15 mg total) by mouth daily. 11/01/21  Yes Theora Gianotti, NP  loratadine (CLARITIN) 10 MG tablet Take 10 mg by mouth daily. 09/13/18  Yes [provider]  Magnesium Oxide 400 MG CAPS Take 1 capsule (400 mg total) by mouth daily. 11/01/21  Yes Theora Gianotti, NP  metoprolol succinate (TOPROL-XL) 25 MG 24 hr tablet Take 25 mg by mouth 2 (two) times daily.   Yes [provider]  omeprazole (PRILOSEC) 20 MG capsule Take 40 mg by mouth daily. 07/25/22  Yes [provider]  ranolazine (RANEXA) 500 MG 12 hr tablet Take 1 tablet (500 mg total) by  mouth 2 (two) times daily. 04/03/22  Yes Furth, Cadence H, PA-C  tamsulosin (FLOMAX) 0.4 MG CAPS capsule Take 0.4 mg by mouth daily. 03/06/21  Yes [provider]  vitamin B-12 (CYANOCOBALAMIN) 500 MCG tablet Take 1 tablet (500 mcg total) by mouth daily. 06/16/22  Yes Earlie Server, MD  meclizine (ANTIVERT) 25 MG tablet Take 1 tablet (25 mg total) by mouth 3 (three) times daily as needed for dizziness. 09/13/21   Shelly Coss, MD  nitroGLYCERIN (NITROSTAT) 0.4 MG SL tablet Place 0.4 mg under the tongue every 5 (five) minutes x 3 doses as needed for chest pain.  03/24/18   [provider]    Inpatient Medications: Scheduled Meds:  amiodarone  200 mg Oral Daily   apixaban  5 mg Oral BID   aspirin EC  81 mg Oral Daily   atorvastatin  80 mg Oral QPM   cyanocobalamin  500 mcg Oral Daily   famotidine  20 mg Oral BID   furosemide  40 mg Oral BID   insulin aspart  0-20 Units Subcutaneous TID WC   insulin aspart  0-5 Units Subcutaneous QHS   insulin aspart protamine- aspart  40 Units Subcutaneous BID WC   isosorbide mononitrate  15 mg Oral Daily   loratadine  10 mg Oral Daily   magnesium oxide  400 mg Oral Daily   methocarbamol  500 mg Oral TID   metoprolol succinate  25 mg Oral BID   ranolazine  500 mg Oral BID   tamsulosin  0.4 mg Oral Daily   Continuous Infusions:  PRN Meds: acetaminophen, alum & mag hydroxide-simeth, meclizine, morphine injection, nitroGLYCERIN, ondansetron (ZOFRAN) IV, oxyCODONE  Allergies:   Allergies  Allergen Reactions   Metformin Diarrhea    Loose stools even with XR    Social History:   Social History   Socioeconomic History   Marital status: Married    Spouse name: Not on file   Number of children: Not on file   Years of education: Not on file   Highest education level: Not on file  Occupational History   Not on file  Tobacco Use   Smoking status: Former   Smokeless tobacco: Never  Vaping Use   Vaping Use: Never used  Substance and  Sexual Activity   Alcohol use: Never   Drug use: Never   Sexual activity: Not Currently  Other Topics Concern   Not on file  Social History Narrative   Not on file   Social Determinants of Health   Financial Resource Strain: Not on file  Food Insecurity: Not on file  Transportation Needs: Not on file  Physical Activity: Not on file  Stress: Not on file  Social Connections: Not on file  Intimate Partner Violence: Not on file     Family History:   Family History  Problem Relation Age of Onset   Heart attack Father 94   Heart attack Sister     ROS:  Review of Systems  Constitutional:  Positive for malaise/fatigue. Negative for chills, diaphoresis, fever and weight loss.  HENT:  Negative for congestion.   Eyes:  Negative for discharge and redness.  Respiratory:  Negative for cough, sputum production, shortness of breath and wheezing.   Cardiovascular:  Positive for chest pain. Negative for palpitations, orthopnea, claudication, leg swelling and PND.       Sharp, pinpoint right-sided chest pain  Gastrointestinal:  Negative for abdominal pain, blood in stool, heartburn, melena, nausea and vomiting.  Musculoskeletal:  Positive for joint pain. Negative for falls and myalgias.       Right scapula pain  Skin:  Negative for rash.  Neurological:  Positive for weakness. Negative for dizziness, tingling, tremors, sensory change, speech change, focal weakness and loss of consciousness.  Endo/Heme/Allergies:  Does not bruise/bleed easily.  Psychiatric/Behavioral:  Negative for substance abuse. The patient is not nervous/anxious.   All other systems reviewed and are negative.     Physical Exam/Data:   Vitals:   08/12/22 0600 08/12/22 0830 08/12/22 1000 08/12/22 1030  BP: (!) 121/45 (!) 103/53 (!) 141/63   Pulse: 63  64 66  Resp: '13 11 19 16  '$ Temp: 97.7 F (36.5 C)   98.6 F (37 C)  TempSrc:      SpO2: 97%  95% 95%    Intake/Output Summary (Last 24 hours) at 08/12/2022  1151 Last data filed at 08/12/2022 1100 Gross per 24 hour  Intake --  Output 1200 ml  Net -1200 ml   There were no vitals filed for this visit. There  is no height or weight on file to calculate BMI.   Physical Exam: General: Well developed, well nourished, in no acute distress. Head: Normocephalic, atraumatic, sclera non-icteric, no xanthomas, nares without discharge.  Neck: Negative for carotid bruits. JVD not elevated. Lungs: Clear bilaterally to auscultation without wheezes, rales, or rhonchi. Breathing is unlabored. Heart: RRR with S1 S2. I/VI systolic murmur RUSB, no rubs, or gallops appreciated. Abdomen: Soft, non-tender, non-distended with normoactive bowel sounds. No hepatomegaly. No rebound/guarding. No obvious abdominal masses. Msk:  Strength and tone appear normal for age. Extremities: No clubbing or cyanosis. No edema. Distal pedal pulses are 2+ and equal bilaterally. Neuro: Alert and oriented X 3. No facial asymmetry. No focal deficit. Moves all extremities spontaneously. Psych:  Responds to questions appropriately with a normal affect.   EKG:  The EKG was personally reviewed and demonstrates: EKG showed sinus bradycardia, 57 bpm, first-degree AV block, prior anteroseptal infarct, poor R wave progression along the precordial leads, QTc 502, and nonspecific ST-T changes Telemetry:  Telemetry was personally reviewed and demonstrates: Sinus rhythm with rare PVCs and artifact  Weights: There were no vitals filed for this visit.  Relevant CV Studies:  [facility] 06/17/2022: Conclusions: Significant multivessel coronary artery disease, as detailed below, including 80% ostial LAD disease followed by sequential 50-60% mid LAD lesions, 50% proximal LCx disease, and 70-80% lesion in proximal segment of rPL1. Widely patent LIMA-LAD and SVG-OM2. Moderately elevated left heart filling pressures. Mildly elevated right heart and pulmonary artery pressures. Normal cardiac output/index.    Recommendations: Escalate diuresis with repeat BMP at follow-up visit next week. Secondary prevention of coronary artery disease and continued antianginal therapy. 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 __________  2D echo 05/02/2022: 1. Left ventricular ejection fraction, by estimation, is 60 to 65%. The  left ventricle has normal function. The left ventricle has no regional  wall motion abnormalities. Left ventricular diastolic parameters are  consistent with Grade II diastolic  dysfunction (pseudonormalization).   2. Right ventricular systolic function is normal. The right ventricular  size is normal. Tricuspid regurgitation signal is inadequate for assessing  PA pressure.   3. Left atrial size was mildly dilated.   4. The mitral valve is normal in structure. Mild mitral valve  regurgitation. No evidence of mitral stenosis.   5. The aortic valve has been repaired/replaced. Aortic valve  regurgitation is not visualized. No aortic stenosis is present. Aortic  valve area, by VTI measures 2.85 cm. Aortic valve mean gradient measures  15.0 mmHg. s/p AVR bovine in 2017   6. The inferior vena cava is normal in size with greater than 50%  respiratory variability, suggesting right atrial pressure of 3 mmHg.   Laboratory Data:  Chemistry Recent Labs  Lab 08/11/22 1138  NA 132*  K 4.3  CL 97*  CO2 27  GLUCOSE 226*  BUN 21  CREATININE 1.10  CALCIUM 9.1  GFRNONAA >60  ANIONGAP 8    Recent Labs  Lab 08/11/22 1138  PROT 8.0  ALBUMIN 4.1  AST 22  ALT 23  ALKPHOS 49  BILITOT 0.9   Hematology Recent Labs  Lab 08/11/22 1138  WBC 3.2*  RBC 3.03*  HGB 11.1*  HCT 34.2*  MCV 112.9*  MCH 36.6*  MCHC 32.5  RDW 15.1  PLT 126*   Cardiac EnzymesNo results for input(s): "TROPONINI" in the last 168 hours. No results for input(s): "TROPIPOC" in the last 168  hours.  BNPNo  results for input(s): "BNP", "PROBNP" in the last 168 hours.  DDimer No results for input(s): "DDIMER" in the last 168 hours.  Radiology/Studies:  US Abdomen Limited RUQ (LIVER/GB)  Result Date: 08/11/2022 IMPRESSION: Cholelithiasis without sonographic evidence of acute cholecystitis. Electronically Signed   By: Abelardo Diesel M.D.   On: 08/11/2022 18:18   CT Angio Chest/Abd/Pel for Dissection W and/or Wo Contrast  Result Date: 08/11/2022 IMPRESSION: 1. No evidence of acute aortic syndrome. 2. No acute findings in the chest, abdomen or pelvis. 3. New 6 mm right upper lobe ground-glass nodule. Initial follow-up with CT at 6 months is recommended to confirm persistence. If persistent, repeat CT is recommended every 2 years until 5 years of stability has been established. This recommendation follows the consensus statement: Guidelines for Management of Incidental Pulmonary Nodules Detected on CT Images: From the Fleischner Society 2017; Radiology 2017; 284:228-243. 4. Aortic Atherosclerosis (ICD10-I70.0). Electronically Signed   By: Yetta Glassman M.D.   On: 08/11/2022 14:24   DG Chest Port 1 View  Result Date: 08/11/2022 IMPRESSION: 1. No evidence of airspace consolidation or pulmonary edema. 2. Minimal atelectasis within the bilateral lung bases. 3. Cardiomegaly. 4.  Aortic Atherosclerosis (ICD10-I70.0). Electronically Signed   By: Kellie Simmering D.O.   On: 08/11/2022 11:46    Assessment and Plan:   1.  CAD status post CABG with mildly elevated high-sensitivity troponin: -Currently chest pain-free -High-sensitivity troponin is minimally elevated and flat trending, not consistent with ACS -Recent LHC showed patent bypass grafts with no indication for intervention -Symptoms are more atypical at this time -No plans for inpatient ischemic evaluation -Continue aggressive risk factor modification and secondary prevention including aspirin, atorvastatin, Imdur, Toprol-XL, and ranolazine  2.  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  PAF: -Maintaining sinus rhythm with Toprol-XL -CHA2DS2-VASc at least 6 -PTA apixaban 5 mg twice daily (does not meet reduced dosing criteria)  4.  HFpEF: -Volume status is difficult to assess on physical exam secondary to body habitus, though he appears largely euvolemic and well compensated -PTA furosemide  5.  MDS: -Blood counts stable -Likely contributing to his ongoing fatigue -Management per hematology/oncology  6.  HTN: -Continue current therapy  7.  HLD: -PTA atorvastatin  8.  OSA: -BiPAP nightly       For questions or updates, please contact [place] Please consult [email or web address] for contact info under Cardiology/STEMI.   Signed, Christell Faith, PA-C [facility] Pager: [phone] 08/12/2022, 11:51 AM

## 2022-08-12 NOTE — ED Notes (Signed)
Patient denies any pain at this time, states he just feels really tired.

## 2022-08-12 NOTE — Progress Notes (Signed)
Inpatient Diabetes Program Recommendations  AACE/ADA: New Consensus Statement on Inpatient Glycemic Control (2015)  Target Ranges:  Prepandial:   less than 140 mg/dL      Peak postprandial:   less than 180 mg/dL (1-2 hours)      Critically ill patients:  140 - 180 mg/dL   Lab Results  Component Value Date   GLUCAP 227 (H) 08/12/2022   HGBA1C 8.3 (H) 05/02/2022    Review of Glycemic Control  Latest Reference Range & Units 08/11/22 21:32 08/12/22 07:59  Glucose-Capillary 70 - 99 mg/dL 223 (H) 227 (H)   Diabetes history: DM  Outpatient Diabetes medications:  Humulin 70/30 130 units bid Current orders for Inpatient glycemic control:  Novolog 70/30 80 units bid Novolog 0-5 units q HS  Inpatient Diabetes Program Recommendations:    Consider further reduction of Novolog 70/30 to 40 units bid.  Orders received.   Thanks,  Adah Perl, RN, BC-ADM Inpatient Diabetes Coordinator Pager [phone]  (8a-5p)

## 2022-08-12 NOTE — Discharge Summary (Signed)
Physician Discharge Summary  Jonathan Cross. PYP:[id number] DOB: [birth year] DOA: 08/11/2022  PCP: Atilano Ina, MD  Admit date: 08/11/2022 Discharge date: 08/12/2022  Admitted From: Home Disposition:  Home  Recommendations for Outpatient Follow-up:  Follow up with PCP in 1-2 weeks   Home Health:No  Equipment/Devices:None   Discharge Condition:Stable  CODE STATUS:FULL  Diet recommendation: Heart healthy/carb modified  Brief/Interim Summary: 77 year old male with extensive cardiac history, status post bioprosthetic AVR who presents for evaluation of acute on chronic chest pain. Patient states he always has chest pain but this particular time the pain was worse than prior. No indication this is cardiac in nature. Troponins flat. EKG nonischemic. Chest pain has improved this morning. Patient had a recent catheterization early December with multivessel coronary artery disease. Cardiology recommending aggressive medical management at that time. Cardiology consultation called for further recommendations   Appreciate cardiology evaluation recommendations.  No plans for inpatient ischemic evaluation.  Chest pain more atypical in nature.  Suspected GI versus MSK.  Patient on home PPI, can resume this.  As needed Robaxin provided for presumed MSK related chest pain.    Discharge Diagnoses:  Principal Problem:   Chest pain Active Problems:   Coronary artery disease of native artery of native heart with stable angina pectoris (HCC)   Class 2 severe obesity due to excess calories with serious comorbidity and body mass index (BMI) of 38.0 to 38.9 in adult 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  At this point considering overall reassuring workup I doubt this is truly cardiac in nature.  Patient also had a recent catheterization with multivessel coronary disease but no lesions that required PCI approximately 4 weeks prior to presentation.  Despite this patient continues to endorse chest pain that is not responding to morphine or nitroglycerin.  We will admit overnight for observation. Plan: Troponins flat.  EKG nonischemic.  Presentation inconsistent with cardiac related chest pain.  Suspect MSK versus GI related.  Patient on home PPI.  Can continue this.  Robaxin prescribed for MSK related chest pain.  Okay for discharge.  Follow-up outpatient PCP and cardiology.  Discharge Instructions  Discharge Instructions     Diet - low sodium heart healthy   Complete by: As directed    Increase activity slowly   Complete by: As directed       Allergies as of 08/12/2022       Reactions   Metformin Diarrhea   Loose stools even with XR        Medication List     TAKE these medications    amiodarone 200 MG tablet Commonly known as: PACERONE Take 1 tablet by mouth once daily   aspirin EC 81 MG tablet Take 81 mg by mouth daily. Swallow whole.   atorvastatin 80 MG tablet Commonly known as: LIPITOR Take 1 tablet (80 mg total) by mouth every evening.   Eliquis 5 MG Tabs tablet Generic drug: apixaban Take 5 mg by mouth 2 (two) times daily.   furosemide 40 MG tablet Commonly known as: LASIX Take 1  tablet (40 mg total) by mouth 2 (two) times daily.   insulin NPH-regular Human (70-30) 100 UNIT/ML injection Inject 130 Units into the skin 2 (two) times daily.   isosorbide mononitrate 30 MG 24 hr tablet Commonly known as: IMDUR Take 0.5 tablets (15 mg total) by mouth daily.   loratadine 10 MG tablet Commonly known as: CLARITIN Take 10  mg by mouth daily.   Magnesium Oxide 400 MG Caps Take 1 capsule (400 mg total) by mouth daily.   meclizine 25 MG tablet Commonly known as: ANTIVERT Take 1 tablet (25 mg total) by mouth 3 (three) times daily as needed for dizziness.   methocarbamol 500 MG tablet Commonly known as: ROBAXIN Take 1 tablet (500 mg total) by mouth every 8 (eight) hours as needed for muscle spasms.   metoprolol succinate 25 MG 24 hr tablet Commonly known as: TOPROL-XL Take 25 mg by mouth 2 (two) times daily.   nitroGLYCERIN 0.4 MG SL tablet Commonly known as: NITROSTAT Place 0.4 mg under the tongue every 5 (five) minutes x 3 doses as needed for chest pain.   omeprazole 20 MG capsule Commonly known as: PRILOSEC Take 40 mg by mouth daily.   ranolazine 500 MG 12 hr tablet Commonly known as: Ranexa Take 1 tablet (500 mg total) by mouth 2 (two) times daily.   tamsulosin 0.4 MG Caps capsule Commonly known as: FLOMAX Take 0.4 mg by mouth daily.   vitamin B-12 500 MCG tablet Commonly known as: CYANOCOBALAMIN Take 1 tablet (500 mcg total) by mouth daily.        Allergies  Allergen Reactions   Metformin Diarrhea    Loose stools even with XR    Consultations: Cardiology-[facility]   Procedures/Studies: US Abdomen Limited RUQ (LIVER/GB)  Result Date: 08/11/2022 CLINICAL DATA:  Assess gallstones. EXAM: ULTRASOUND ABDOMEN LIMITED RIGHT UPPER QUADRANT COMPARISON:  CT chest abdomen pelvis August 11, 2022, CT abdomen pelvis June 28, 2019 FINDINGS: Gallbladder: Gallstones noted gallbladder, largest measures 0.5 cm. No wall thickening visualized. No sonographic Murphy sign noted by sonographer. Common bile duct: Diameter: 3.9 mm Liver: 0.8 x 0.5 x 0.9 cm liver cyst is identified in left lobe stable compared to prior CT of 2020. Within normal limits in parenchymal echogenicity. Portal vein is patent on color Doppler imaging with normal direction of blood flow towards the liver. Other: None. IMPRESSION:  Cholelithiasis without sonographic evidence of acute cholecystitis. Electronically Signed   By: Abelardo Diesel M.D.   On: 08/11/2022 18:18   CT Angio Chest/Abd/Pel for Dissection W and/or Wo Contrast  Result Date: 08/11/2022 CLINICAL DATA:  Chest pain EXAM: CT ANGIOGRAPHY CHEST, ABDOMEN AND PELVIS TECHNIQUE: Non-contrast CT of the chest was initially obtained. Multidetector CT imaging through the chest, abdomen and pelvis was performed using the standard protocol during bolus administration of intravenous contrast. Multiplanar reconstructed images and MIPs were obtained and reviewed to evaluate the vascular anatomy. RADIATION DOSE REDUCTION: This exam was performed according to the departmental dose-optimization program which includes automated exposure control, adjustment of the mA and/or kV according to patient size and/or use of iterative reconstruction technique. CONTRAST:  142m OMNIPAQUE IOHEXOL 350 MG/ML SOLN COMPARISON:  CT abdomen and pelvis dated October 19, 2018; chest CT dated April 17, 2019 FINDINGS: CTA CHEST FINDINGS Cardiovascular: Normal heart size. No pericardial effusion. Normal caliber thoracic aorta with moderate atherosclerotic disease. Severe coronary artery calcifications status post CABG. Prior aortic valve replacement. No suspicious filling defects of the central pulmonary arteries. Mediastinum/Nodes: Small hiatal hernia. Thyroid  is unremarkable. No pathologically enlarged lymph nodes seen in the chest. Lungs/Pleura: Central airways are patent. No consolidation, pleural effusion or pneumothorax. Mild bibasilar atelectasis. New ground-glass nodule of the right upper lobe measuring 6 mm on series 6, image 76, not present on prior chest CT. Musculoskeletal: Prior median sternotomy with intact sternal wires. No aggressive appearing osseous lesions. Review of the MIP images confirms the above findings. CTA ABDOMEN AND PELVIS FINDINGS VASCULAR Aorta: Normal caliber aorta without aneurysm,  dissection, vasculitis or significant stenosis. Moderate atherosclerotic disease with ulcerated soft plaque of the infrarenal abdominal aorta. Celiac: Patent without evidence of aneurysm, dissection, or vasculitis. Moderate narrowing at the origin due to diaphragmatic compression and calcified plaque. SMA: Patent without evidence of aneurysm, dissection, vasculitis or significant stenosis. Renals: Single right and two left renal arteries without evidence of aneurysm, dissection, vasculitis, fibromuscular dysplasia or significant stenosis. IMA: Patent without evidence of aneurysm, dissection, vasculitis or significant stenosis. Inflow: Patent without evidence of aneurysm, dissection, vasculitis or significant stenosis. Veins: No obvious venous abnormality within the limitations of this arterial phase study. Review of the MIP images confirms the above findings. NON-VASCULAR Hepatobiliary: No focal liver abnormality. Gallstones with no evidence of gallbladder wall thickening. No biliary ductal dilation. Pancreas: Unremarkable. No pancreatic ductal dilatation or surrounding inflammatory changes. Spleen: Normal in size without focal abnormality. Adrenals/Urinary Tract: Bilateral adrenal glands are unremarkable. No hydronephrosis. Hyperdense of the upper pole of the right kidney measuring 7 mm on series 5, image 114, unchanged when compared with prior exam and likely a proteinaceous cyst. Additional bilateral low-attenuation renal lesions, largest are compatible with simple cysts, others are small to completely characterize. No specific follow-up imaging is recommended for renal lesions. Stomach/Bowel: Stomach is within normal limits. No evidence of bowel wall thickening, distention, or inflammatory changes. Lymphatic: No pathologically enlarged lymph nodes seen in the abdomen or pelvis. Reproductive: Prostate is unremarkable. Other: No abdominal wall hernia or abnormality. No abdominopelvic ascites. Musculoskeletal: No  acute or significant osseous findings. Review of the MIP images confirms the above findings. IMPRESSION: 1. No evidence of acute aortic syndrome. 2. No acute findings in the chest, abdomen or pelvis. 3. New 6 mm right upper lobe ground-glass nodule. Initial follow-up with CT at 6 months is recommended to confirm persistence. If persistent, repeat CT is recommended every 2 years until 5 years of stability has been established. This recommendation follows the consensus statement: Guidelines for Management of Incidental Pulmonary Nodules Detected on CT Images: From the Fleischner Society 2017; Radiology 2017; 284:228-243. 4. Aortic Atherosclerosis (ICD10-I70.0). Electronically Signed   By: Yetta Glassman M.D.   On: 08/11/2022 14:24   DG Chest Port 1 View  Result Date: 08/11/2022 CLINICAL DATA:  Provided history: Chest pain. Additional provided: Chest and back pain. History of CAD, diabetes, hypertension. Prior CABG in 2017. Former smoker. EXAM: PORTABLE CHEST 1 VIEW COMPARISON:  Prior chest radiographs 05/01/2022 and earlier. FINDINGS: Prior median sternotomy/CABG. Cardiomegaly. Aortic atherosclerosis. Minimal atelectasis within both lung bases. No appreciable airspace consolidation or pulmonary edema. No evidence of pleural effusion or pneumothorax. Degenerative changes of the right acromioclavicular joint. IMPRESSION: 1. No evidence of airspace consolidation or pulmonary edema. 2. Minimal atelectasis within the bilateral lung bases. 3. Cardiomegaly. 4.  Aortic Atherosclerosis (ICD10-I70.0). Electronically Signed   By: Kellie Simmering D.O.   On: 08/11/2022 11:46      Subjective: Seen and examined on the day of discharge.  Fatigue but otherwise stable.  Chest pain resolved.  Stable for discharge home.  Discharge Exam: Vitals:   08/12/22 1000 08/12/22 1030  BP: (!) 141/63   Pulse: 64 66  Resp: 19 16  Temp:  98.6 F (37 C)  SpO2: 95% 95%   Vitals:   08/12/22 0600 08/12/22 0830 08/12/22 1000 08/12/22  1030  BP: (!) 121/45 (!) 103/53 (!) 141/63   Pulse: 63  64 66  Resp: '13 11 19 16  '$ Temp: 97.7 F (36.5 C)   98.6 F (37 C)  TempSrc:      SpO2: 97%  95% 95%    General: Pt is alert, awake, not in acute distress Cardiovascular: RRR, S1/S2 +, no rubs, no gallops Respiratory: CTA bilaterally, no wheezing, no rhonchi Abdominal: Soft, NT, ND, bowel sounds + Extremities: no edema, no cyanosis    The results of significant diagnostics from this hospitalization (including imaging, microbiology, ancillary and laboratory) are listed below for reference.     Microbiology: No results found for this or any previous visit (from the past 240 hour(s)).   Labs: BNP (last 3 results) Recent Labs    11/27/21 1541  BNP 75.6   Basic Metabolic Panel: Recent Labs  Lab 08/11/22 1138  NA 132*  K 4.3  CL 97*  CO2 27  GLUCOSE 226*  BUN 21  CREATININE 1.10  CALCIUM 9.1   Liver Function Tests: Recent Labs  Lab 08/11/22 1138  AST 22  ALT 23  ALKPHOS 49  BILITOT 0.9  PROT 8.0  ALBUMIN 4.1   Recent Labs  Lab 08/11/22 1138  LIPASE 38   No results for input(s): "AMMONIA" in the last 168 hours. CBC: Recent Labs  Lab 08/11/22 1138  WBC 3.2*  HGB 11.1*  HCT 34.2*  MCV 112.9*  PLT 126*   Cardiac Enzymes: No results for input(s): "CKTOTAL", "CKMB", "CKMBINDEX", "TROPONINI" in the last 168 hours. BNP: Invalid input(s): "POCBNP" CBG: Recent Labs  Lab 08/11/22 2132 08/12/22 0759 08/12/22 1226  GLUCAP 223* 227* 289*   D-Dimer No results for input(s): "DDIMER" in the last 72 hours. Hgb A1c No results for input(s): "HGBA1C" in the last 72 hours. Lipid Profile No results for input(s): "CHOL", "HDL", "LDLCALC", "TRIG", "CHOLHDL", "LDLDIRECT" in the last 72 hours. Thyroid function studies No results for input(s): "TSH", "T4TOTAL", "T3FREE", "THYROIDAB" in the last 72 hours.  Invalid input(s): "FREET3" Anemia work up No results for input(s): "VITAMINB12", "FOLATE",  "FERRITIN", "TIBC", "IRON", "RETICCTPCT" in the last 72 hours. Urinalysis    Component Value Date/Time   COLORURINE YELLOW (A) 05/01/2022 1711   APPEARANCEUR CLOUDY (A) 05/01/2022 1711   APPEARANCEUR Cloudy (A) 06/07/2019 1409   LABSPEC 1.012 05/01/2022 1711   PHURINE 5.0 05/01/2022 1711   GLUCOSEU NEGATIVE 05/01/2022 1711   HGBUR SMALL (A) 05/01/2022 1711   BILIRUBINUR NEGATIVE 05/01/2022 1711   BILIRUBINUR Negative 06/07/2019 1409   KETONESUR NEGATIVE 05/01/2022 1711   PROTEINUR 30 (A) 05/01/2022 1711   NITRITE NEGATIVE 05/01/2022 1711   LEUKOCYTESUR LARGE (A) 05/01/2022 1711   Sepsis Labs Recent Labs  Lab 08/11/22 1138  WBC 3.2*   Microbiology No results found for this or any previous visit (from the past 240 hour(s)).   Time coordinating discharge: Over 30 minutes  SIGNED:   Sidney Ace, MD  [facility] 08/12/2022, 12:37 PM Pager   If 7PM-7AM, please contact night-coverage

## 2022-08-21 IMAGING — CT CT HEAD W/O CM
4 series · 15 of 47 positions shown, 17 images · non-contrast
Comparison: None.

CLINICAL DATA: Dizziness, vertigo x2 weeks



[Series 2: head wo · axial · 0.44mm/px · z∈[+306,+431]mm · 7 of 35 slices shown, 9 images]
[im 5/35  brain]
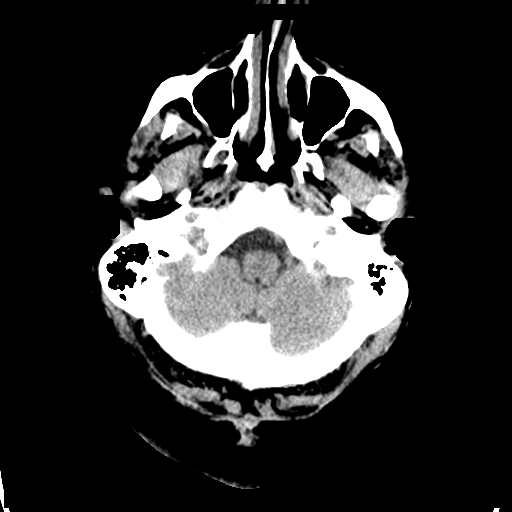
[im 5/35  bone]
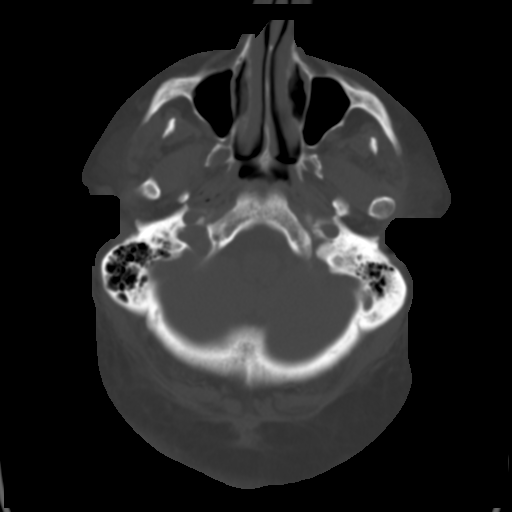
[im 9/35  brain]
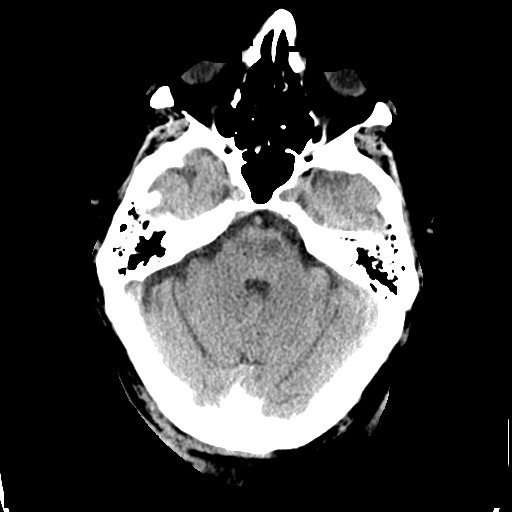
[im 13/35  brain]
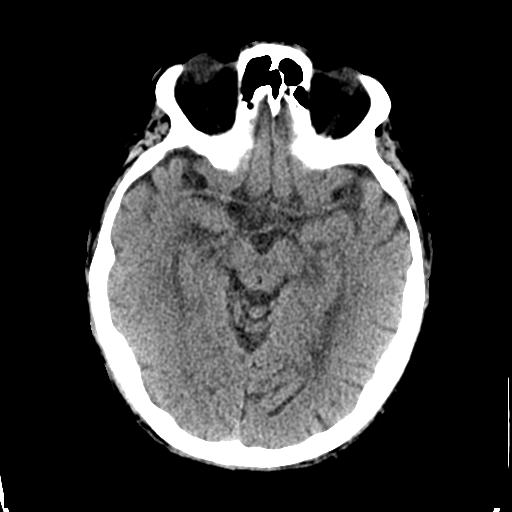
[im 18/35  brain]
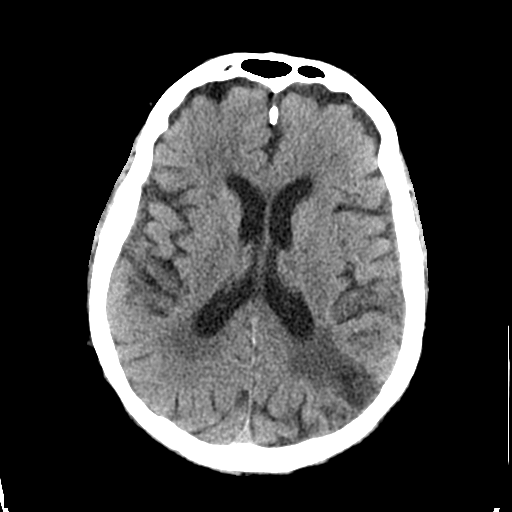
[im 22/35  brain]
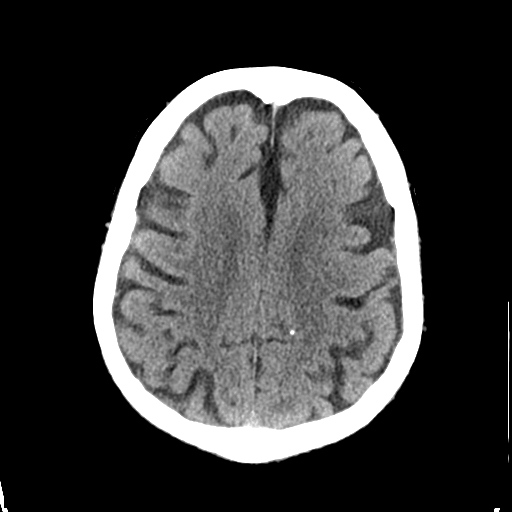
[im 22/35  bone]
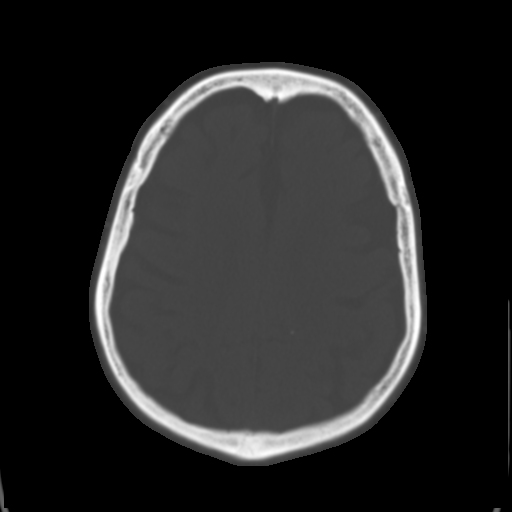
[im 26/35  brain]
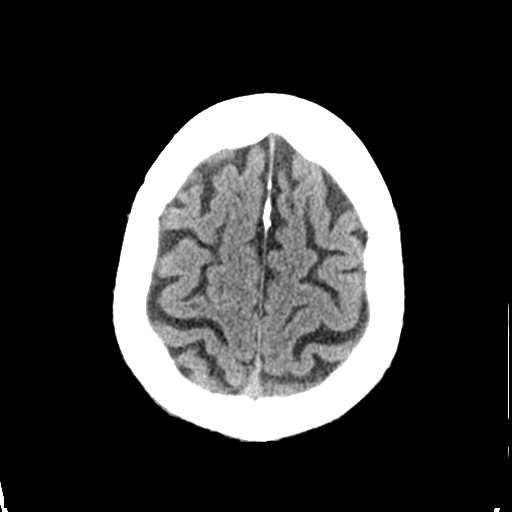
[im 30/35  brain]
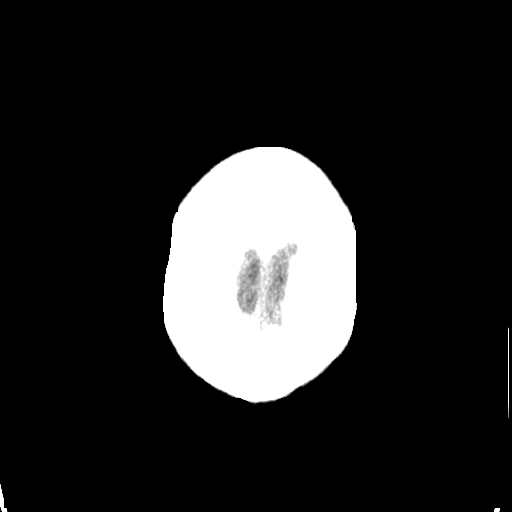

[Series 3: head bone · axial · 0.44mm/px · z∈[+302,+320]mm · 2 of 87 slices shown]
[im 9/87  bone]
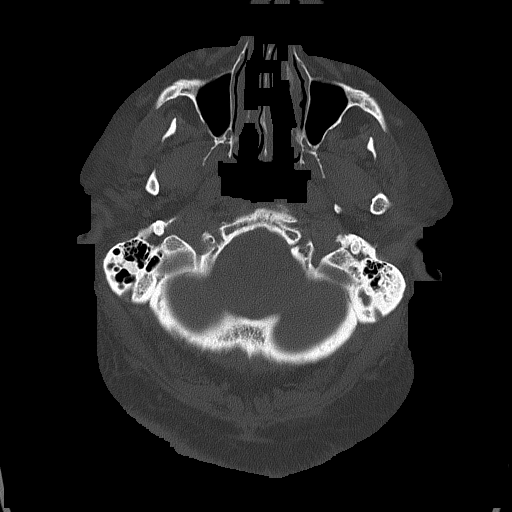
[im 18/87  bone]
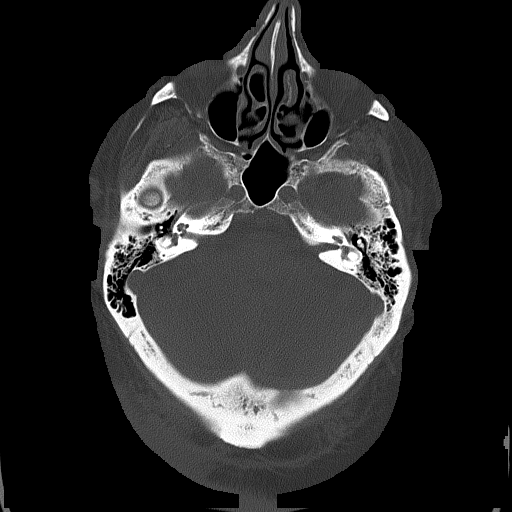

[Series 4: coronal soft tissue · coronal · 0.32mm/px · 3 of 79 slices shown]
[im 27/79  brain]
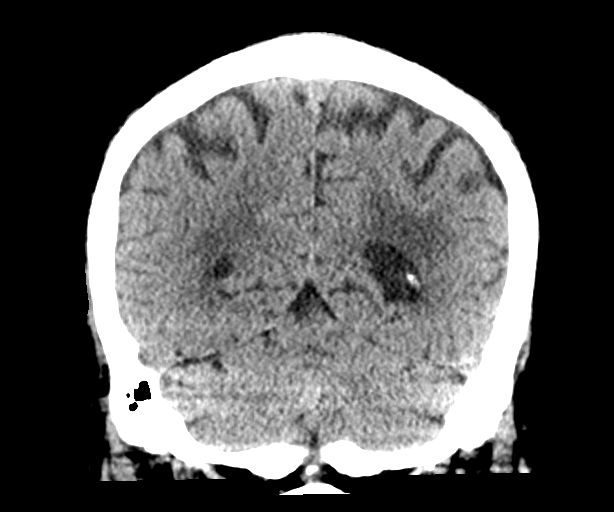
[im 35/79  brain]
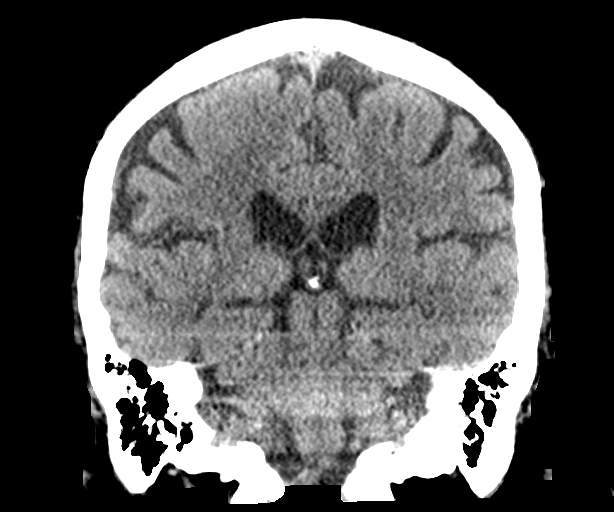
[im 44/79  brain]
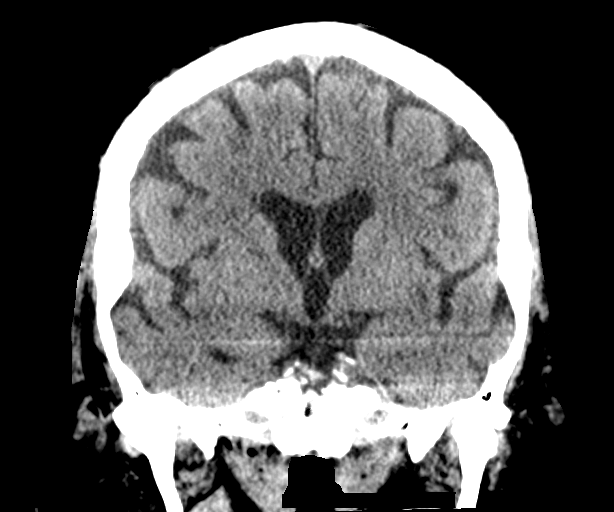

[Series 5: sagittal soft tissue · sagittal · 0.32mm/px · 3 of 67 slices shown]
[im 23/67  brain]
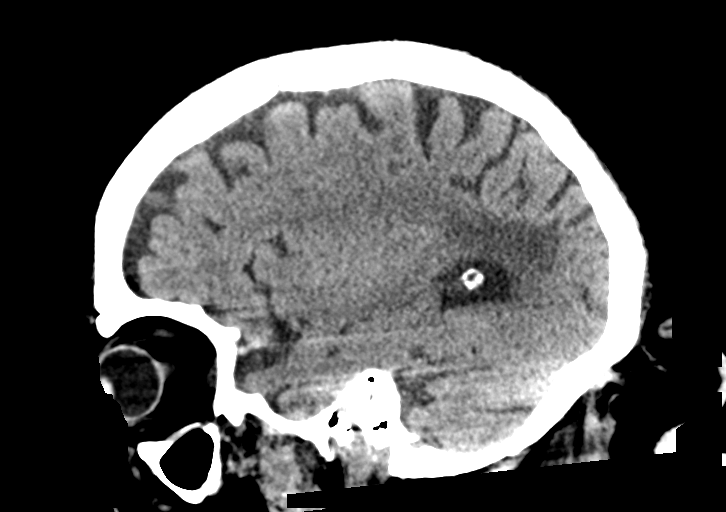
[im 34/67  brain]
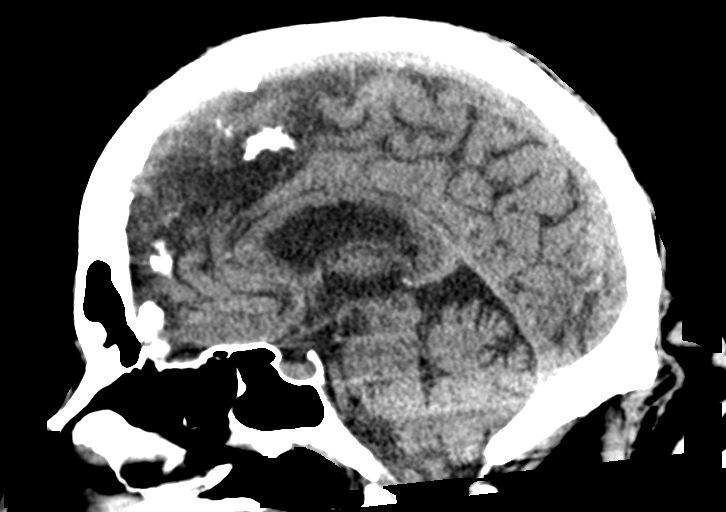
[im 45/67  brain]
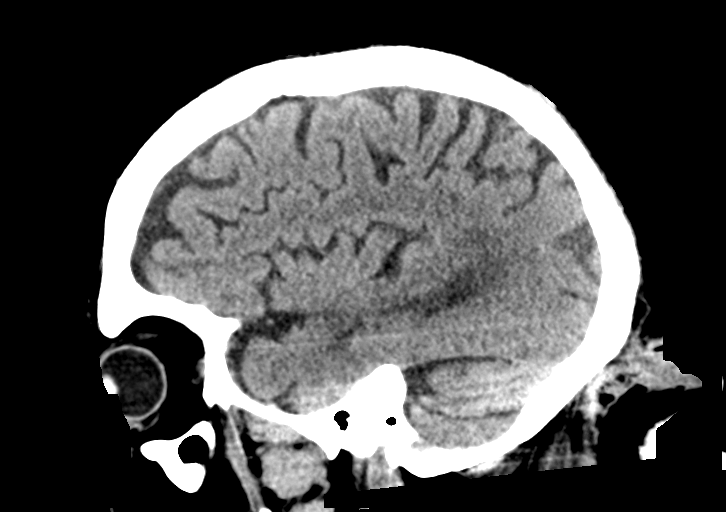

[15 of 47 positions shown; findings below may reference images not displayed]

FINDINGS: Brain: No acute intracranial findings are seen. Cortical sulci are
prominent. There is small area of encephalomalacia in the left
parietal cortex. There are no signs of bleeding within the cranium.
In image 9 of series, there is 8 mm low-density in the left side of
pons.

Vascular: Unremarkable.

Skull: No fracture is seen in the calvarium.

Sinuses/Orbits: There is mild mucosal thickening in the ethmoid and
right maxillary sinuses.

Other: None
IMPRESSION: No acute intracranial findings are seen in noncontrast CT brain.
There are no signs of bleeding within the cranium.

There is small area of old infarction in the left parietal cortex.
There is subtle 8 mm low-density in the left side of pons which may
suggest an artifact related to beam hardening or suggest recent or
old infarct.

Mild chronic sinusitis.

## 2022-09-09 ENCOUNTER — Inpatient Hospital Stay: Payer: Medicare HMO | Attending: Oncology

## 2022-09-09 DIAGNOSIS — D469 Myelodysplastic syndrome, unspecified: Secondary | ICD-10-CM | POA: Diagnosis present

## 2022-09-09 LAB — CBC WITH DIFFERENTIAL/PLATELET
Abs Immature Granulocytes: 0.03 10*3/uL (ref 0.00–0.07)
Basophils Absolute: 0.1 10*3/uL (ref 0.0–0.1)
Basophils Relative: 1 %
Eosinophils Absolute: 0.1 10*3/uL (ref 0.0–0.5)
Eosinophils Relative: 2 %
HCT: 37.4 % — ABNORMAL LOW (ref 39.0–52.0)
Hemoglobin: 12.4 g/dL — ABNORMAL LOW (ref 13.0–17.0)
Immature Granulocytes: 1 %
Lymphocytes Relative: 39 %
Lymphs Abs: 1.6 10*3/uL (ref 0.7–4.0)
MCH: 37.7 pg — ABNORMAL HIGH (ref 26.0–34.0)
MCHC: 33.2 g/dL (ref 30.0–36.0)
MCV: 113.7 fL — ABNORMAL HIGH (ref 80.0–100.0)
Monocytes Absolute: 0.5 10*3/uL (ref 0.1–1.0)
Monocytes Relative: 11 %
Neutro Abs: 1.9 10*3/uL (ref 1.7–7.7)
Neutrophils Relative %: 46 %
Platelets: 126 10*3/uL — ABNORMAL LOW (ref 150–400)
RBC: 3.29 MIL/uL — ABNORMAL LOW (ref 4.22–5.81)
RDW: 14.9 % (ref 11.5–15.5)
WBC: 4.1 10*3/uL (ref 4.0–10.5)
nRBC: 0 % (ref 0.0–0.2)

## 2022-10-14 ENCOUNTER — Inpatient Hospital Stay: Payer: Medicare HMO | Attending: Oncology

## 2022-10-14 DIAGNOSIS — I110 Hypertensive heart disease with heart failure: Secondary | ICD-10-CM | POA: Insufficient documentation

## 2022-10-14 DIAGNOSIS — E119 Type 2 diabetes mellitus without complications: Secondary | ICD-10-CM | POA: Insufficient documentation

## 2022-10-14 DIAGNOSIS — I200 Unstable angina: Secondary | ICD-10-CM | POA: Insufficient documentation

## 2022-10-14 DIAGNOSIS — R5383 Other fatigue: Secondary | ICD-10-CM | POA: Insufficient documentation

## 2022-10-14 DIAGNOSIS — I5032 Chronic diastolic (congestive) heart failure: Secondary | ICD-10-CM | POA: Insufficient documentation

## 2022-10-14 DIAGNOSIS — L853 Xerosis cutis: Secondary | ICD-10-CM | POA: Insufficient documentation

## 2022-10-14 DIAGNOSIS — I480 Paroxysmal atrial fibrillation: Secondary | ICD-10-CM | POA: Insufficient documentation

## 2022-10-14 DIAGNOSIS — Z7901 Long term (current) use of anticoagulants: Secondary | ICD-10-CM | POA: Insufficient documentation

## 2022-10-14 DIAGNOSIS — Z794 Long term (current) use of insulin: Secondary | ICD-10-CM | POA: Insufficient documentation

## 2022-10-14 DIAGNOSIS — G4730 Sleep apnea, unspecified: Secondary | ICD-10-CM | POA: Insufficient documentation

## 2022-10-14 DIAGNOSIS — I509 Heart failure, unspecified: Secondary | ICD-10-CM | POA: Insufficient documentation

## 2022-10-14 DIAGNOSIS — Z87442 Personal history of urinary calculi: Secondary | ICD-10-CM | POA: Insufficient documentation

## 2022-10-14 DIAGNOSIS — Z79899 Other long term (current) drug therapy: Secondary | ICD-10-CM | POA: Insufficient documentation

## 2022-10-14 DIAGNOSIS — I4891 Unspecified atrial fibrillation: Secondary | ICD-10-CM | POA: Insufficient documentation

## 2022-10-14 DIAGNOSIS — R079 Chest pain, unspecified: Secondary | ICD-10-CM | POA: Insufficient documentation

## 2022-10-14 DIAGNOSIS — Z7982 Long term (current) use of aspirin: Secondary | ICD-10-CM | POA: Insufficient documentation

## 2022-10-14 DIAGNOSIS — D469 Myelodysplastic syndrome, unspecified: Secondary | ICD-10-CM | POA: Insufficient documentation

## 2022-10-14 DIAGNOSIS — I2510 Atherosclerotic heart disease of native coronary artery without angina pectoris: Secondary | ICD-10-CM | POA: Insufficient documentation

## 2022-10-14 DIAGNOSIS — E785 Hyperlipidemia, unspecified: Secondary | ICD-10-CM | POA: Insufficient documentation

## 2022-10-17 ENCOUNTER — Inpatient Hospital Stay: Payer: Medicare HMO

## 2022-10-17 DIAGNOSIS — I480 Paroxysmal atrial fibrillation: Secondary | ICD-10-CM | POA: Diagnosis not present

## 2022-10-17 DIAGNOSIS — I509 Heart failure, unspecified: Secondary | ICD-10-CM | POA: Diagnosis not present

## 2022-10-17 DIAGNOSIS — Z7982 Long term (current) use of aspirin: Secondary | ICD-10-CM | POA: Diagnosis not present

## 2022-10-17 DIAGNOSIS — I4891 Unspecified atrial fibrillation: Secondary | ICD-10-CM | POA: Diagnosis not present

## 2022-10-17 DIAGNOSIS — I2510 Atherosclerotic heart disease of native coronary artery without angina pectoris: Secondary | ICD-10-CM | POA: Diagnosis not present

## 2022-10-17 DIAGNOSIS — Z79899 Other long term (current) drug therapy: Secondary | ICD-10-CM | POA: Diagnosis not present

## 2022-10-17 DIAGNOSIS — I200 Unstable angina: Secondary | ICD-10-CM | POA: Diagnosis not present

## 2022-10-17 DIAGNOSIS — I110 Hypertensive heart disease with heart failure: Secondary | ICD-10-CM | POA: Diagnosis not present

## 2022-10-17 DIAGNOSIS — Z7901 Long term (current) use of anticoagulants: Secondary | ICD-10-CM | POA: Diagnosis not present

## 2022-10-17 DIAGNOSIS — D469 Myelodysplastic syndrome, unspecified: Secondary | ICD-10-CM

## 2022-10-17 DIAGNOSIS — L853 Xerosis cutis: Secondary | ICD-10-CM | POA: Diagnosis not present

## 2022-10-17 DIAGNOSIS — Z87442 Personal history of urinary calculi: Secondary | ICD-10-CM | POA: Diagnosis not present

## 2022-10-17 DIAGNOSIS — R079 Chest pain, unspecified: Secondary | ICD-10-CM | POA: Diagnosis not present

## 2022-10-17 DIAGNOSIS — G4730 Sleep apnea, unspecified: Secondary | ICD-10-CM | POA: Diagnosis not present

## 2022-10-17 DIAGNOSIS — E785 Hyperlipidemia, unspecified: Secondary | ICD-10-CM | POA: Diagnosis not present

## 2022-10-17 DIAGNOSIS — Z794 Long term (current) use of insulin: Secondary | ICD-10-CM | POA: Diagnosis not present

## 2022-10-17 DIAGNOSIS — E119 Type 2 diabetes mellitus without complications: Secondary | ICD-10-CM | POA: Diagnosis not present

## 2022-10-17 DIAGNOSIS — R5383 Other fatigue: Secondary | ICD-10-CM | POA: Diagnosis not present

## 2022-10-17 DIAGNOSIS — I5032 Chronic diastolic (congestive) heart failure: Secondary | ICD-10-CM | POA: Diagnosis not present

## 2022-10-17 LAB — CBC WITH DIFFERENTIAL/PLATELET
Abs Immature Granulocytes: 0.02 10*3/uL (ref 0.00–0.07)
Basophils Absolute: 0.1 10*3/uL (ref 0.0–0.1)
Basophils Relative: 2 %
Eosinophils Absolute: 0.1 10*3/uL (ref 0.0–0.5)
Eosinophils Relative: 2 %
HCT: 34.2 % — ABNORMAL LOW (ref 39.0–52.0)
Hemoglobin: 11.3 g/dL — ABNORMAL LOW (ref 13.0–17.0)
Immature Granulocytes: 1 %
Lymphocytes Relative: 37 %
Lymphs Abs: 1.2 10*3/uL (ref 0.7–4.0)
MCH: 37.7 pg — ABNORMAL HIGH (ref 26.0–34.0)
MCHC: 33 g/dL (ref 30.0–36.0)
MCV: 114 fL — ABNORMAL HIGH (ref 80.0–100.0)
Monocytes Absolute: 0.3 10*3/uL (ref 0.1–1.0)
Monocytes Relative: 9 %
Neutro Abs: 1.6 10*3/uL — ABNORMAL LOW (ref 1.7–7.7)
Neutrophils Relative %: 49 %
Platelets: 134 10*3/uL — ABNORMAL LOW (ref 150–400)
RBC: 3 MIL/uL — ABNORMAL LOW (ref 4.22–5.81)
RDW: 14.9 % (ref 11.5–15.5)
WBC: 3.3 10*3/uL — ABNORMAL LOW (ref 4.0–10.5)
nRBC: 0 % (ref 0.0–0.2)

## 2022-10-17 LAB — IRON AND TIBC
Iron: 107 ug/dL (ref 45–182)
Saturation Ratios: 34 % (ref 17.9–39.5)
TIBC: 314 ug/dL (ref 250–450)
UIBC: 207 ug/dL

## 2022-10-17 LAB — RETIC PANEL
Immature Retic Fract: 27.7 % — ABNORMAL HIGH (ref 2.3–15.9)
RBC.: 3.03 MIL/uL — ABNORMAL LOW (ref 4.22–5.81)
Retic Count, Absolute: 130.9 10*3/uL (ref 19.0–186.0)
Retic Ct Pct: 4.3 % — ABNORMAL HIGH (ref 0.4–3.1)
Reticulocyte Hemoglobin: 35.2 pg (ref >27.9)

## 2022-10-17 LAB — FERRITIN: Ferritin: 150 ng/mL (ref 24–336)

## 2022-10-21 ENCOUNTER — Encounter: Payer: Self-pay | Admitting: Oncology

## 2022-10-21 ENCOUNTER — Inpatient Hospital Stay (HOSPITAL_BASED_OUTPATIENT_CLINIC_OR_DEPARTMENT_OTHER): Payer: Medicare HMO | Admitting: Oncology

## 2022-10-21 VITALS — BP 126/62 | HR 75 | Temp 97.5°F | Resp 18 | Wt 261.6 lb

## 2022-10-21 DIAGNOSIS — R5383 Other fatigue: Secondary | ICD-10-CM | POA: Diagnosis not present

## 2022-10-21 DIAGNOSIS — I200 Unstable angina: Secondary | ICD-10-CM | POA: Diagnosis not present

## 2022-10-21 DIAGNOSIS — L853 Xerosis cutis: Secondary | ICD-10-CM | POA: Diagnosis not present

## 2022-10-21 DIAGNOSIS — D469 Myelodysplastic syndrome, unspecified: Secondary | ICD-10-CM | POA: Diagnosis not present

## 2022-10-21 MED ORDER — VITAMIN B-12 500 MCG PO TABS: 500.0000 ug | ORAL_TABLET | Freq: Every day | ORAL | 1 refills | Status: DC

## 2022-10-21 NOTE — Assessment & Plan Note (Signed)
[id number] mutation.[id number] [facility] Dr. Zenaida Niece Deventer's notes were reviewed.  I agree with initiating EPO therapy if his hemoglobin is <=8 or <=9 with evidence of myocardium ischemia.  Labs are reviewed and discussed with patient. Lab Results  Component Value Date   HGB 11.3 (L) 10/17/2022   FERRITIN 150 10/17/2022    Repeat cbc today showed a hemoglobin >11, no need to initiate therapy for now.  Continue close monitor, repeat cbc monthly

## 2022-10-21 NOTE — Assessment & Plan Note (Signed)
Recommend patient to apply skin moisturizer.

## 2022-10-21 NOTE — Assessment & Plan Note (Addendum)
Multifactorial, due to his medical problems. Out of proportion to his current hemoglobin level.  Normal TSH Discussed with patient that fatigue may be secondary to other etiologies. B12 is wnl, <400, off  oral B12 daily. Recommend patient to start B12 supplementation , refills sent .

## 2022-10-21 NOTE — Assessment & Plan Note (Signed)
CHF, Afib. S/p cardiac cath. + CAD, resumed on Eliquis  Echo showed preserved LVEF with normal bioprosthetic aortic valve function  Follow up with cardiology Dr. End.   

## 2022-10-21 NOTE — Progress Notes (Signed)
Hematology/Oncology Progress note Telephone:(336) [phone] [phone]) [phone]       ASSESSMENT & PLAN:   Myelodysplastic syndrome (HCC) ZRSR2 mutation.MDS-IB-1 [facility] Dr. Zenaida NieceVan Deventer's notes were reviewed.  I agree with initiating EPO therapy if his hemoglobin is <=8 or <=9 with evidence of myocardium ischemia.  Labs are reviewed and discussed with patient. Lab Results  Component Value Date   HGB 11.3 (L) 10/17/2022   FERRITIN 150 10/17/2022    Repeat cbc today showed a hemoglobin >11, no need to initiate therapy for now.  Continue close monitor, repeat cbc monthly   Accelerating angina (HCC) CHF, Afib. S/p cardiac cath. + CAD, resumed on Eliquis  Echo showed preserved LVEF with normal bioprosthetic aortic valve function  Follow up with cardiology Dr. Okey DupreEnd.    Fatigue Multifactorial, due to his medical problems. Out of proportion to his current hemoglobin level.  Normal TSH Discussed with patient that fatigue may be secondary to other etiologies. B12 is wnl, <400, off  oral B12 500mcg daily. Recommend patient to start B12 supplementation 500mcg, refills sent .    Dry skin Recommend patient to apply skin moisturizer.   Orders Placed This Encounter  Procedures   CBC with Differential (Cancer Center Only)    Standing Status:   Future    Standing Expiration Date:   10/21/2023   Iron and TIBC    Standing Status:   Future    Standing Expiration Date:   10/21/2023   Ferritin    Standing Status:   Future    Standing Expiration Date:   10/21/2023   Vitamin B12    Standing Status:   Future    Standing Expiration Date:   10/21/2023   Follow up per LOS All questions were answered. The patient knows to call the clinic with any problems, questions or concerns.  Rickard PatienceZhou Meckenzie Balsley, MD, PhD [facility] 10/21/2022     CHIEF COMPLAINTS/PURPOSE OF CONSULTATION:  MDS  HISTORY OF PRESENTING ILLNESS:  Jonathan ShellingGurney Lee Cardinal Jr. 77 y.o. male presents to establish care for MDS I  have reviewed his chart and materials related to his cancer extensively and collaborated history with the patient. Summary of oncologic history is as follows: Oncology History  Myelodysplastic syndrome  05/21/2022 Bone Marrow Biopsy   Bone marrow biopsy showed  - - Hypercellular bone marrow (70%) with trilineage hematopoiesis, megakaryocytic dyspoiesis, and 6% blasts by manual aspirate differential (see Comment)  The current findings of pancytopenia, hypercellular bone marrow with megakaryocyte dyspoiesis (>10%) and increased blasts (6%) in addition to the presence of a clonal molecular abnormality (ZRSRS mutation with VAF 50.7) are consistent with myelodysplastic neoplasm with increased blasts (MDS-IB-1) in the absence of MDS or AML defining genetic abnormality.    06/12/2022 Initial Diagnosis   Myelodysplastic syndrome [facility](HCC)  Patient follows up with [facility] Dr. Leotis PainVan Deventer.  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 See above.  Using the IPSS-M, he has low risk disease with a median leukemia free survival of about 6 years.  There is concern of MDS may add to his possible angina.  Dr. Leotis PainVan Deventer recommends treatment  1) IF he has evidence of cardiac ischemia that has been maximally medically managed AND a hemoglobin < 9 g/dL 2 ) If his hemoglobin was < 8.0   Darbepoietin/GCSF per Darcel BayleyKelaidi et al Dewayne Hatch(Ann Hem 2013 May; 92(5) Lucile CraterKelaidi AnnHem 2013 May; 92(5) [email or web address]://[email or web address])  Ferritin:  Treat with iron if ferritin is < 50 Treatment  Darbepoetin 500 mcg every 2 weeks CR = Hgb > 11.5 PR = Inc in Hgb of 1.5 If no response in 12 weeks, add G-CSF 300 mcg two times a week to maintain WBC counts between 5 and 10  If no response at 24 weeks, discontinue trial If hemoglobin is > 12, hold darbepoetin until hemoglobin is < 11.  Resume darbepoetin at every 3 weeks. Continue to increase interval by 1 week if hemoglobin increases to > 12 again.      His hemoglobin was 8 on 05/01/22, and he received one unit of PRBC transfusion.  He is accompanied by his daughter today.  + chronic fatigue, intermittent chest pain, stable, not worse.    INTERVAL HISTORY Jonathan Cross. is a 77 y.o. male who has above history reviewed by me today presents for follow up visit for MDS, anemia.   - Reports "increased weakness and tiredness. - Experiencing fatigue, described as feeling "like a fat tear,"  -He has forgotten to take vitamin B12 supplementation +skin as very dry with a habit of scratching legs; advised to apply moisturizer cream. Patient continues to have intermittent chest discomfort.  He has no cardiology follow-up appointments.   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 04/2022 Echo: EF 60-65%, no rwma, GrII DD, nl RV fxn, mildly dil LA, mild MR, nl fxn'ing biopros AoV.   Coronary artery disease    a. 01/2016 CABG x 2: LIMA->LAD, VG->OM; b. 05/2021 MV: small, mild, rev apical lateral and apical inf defects ->subtle ischemia vs artifact, EF 55-65%-->low risk.   Diabetes    H/O aortic valve replacement    a. 01/2016 s/p bioprosthetic AVR @ [facility] for Severe AS; b. 05/2021 Echo: EF 55-60%, nl fxn of bioprosthetic AVR; c. 04/2022 Echo: EF 60-65%, nl fxn'ing AoV.   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 CHA2DS2VASc = 5-->eliquis/amio.   Sleep apnea     SURGICAL HISTORY: Past Surgical History:  Procedure Laterality Date   AORTIC VALVE REPLACEMENT  2017   [facility], bioprosthetic   APPENDECTOMY     BONE MARROW BIOPSY     05/2022    CARDIAC VALVE REPLACEMENT     CORONARY ARTERY BYPASS GRAFT  2017   [facility] - LIMA-LAD and SVG-OM   CYSTOSCOPY W/ RETROGRADES Right 05/27/2019   Procedure: CYSTOSCOPY WITH RETROGRADE PYELOGRAM;  Surgeon: Sondra Come, MD;  Location: [facility];  Service: Urology;  Laterality: Right;   CYSTOSCOPY W/ RETROGRADES Right 07/11/2019   Procedure: CYSTOSCOPY WITH RETROGRADE PYELOGRAM;  Surgeon: Sondra Come, MD;  Location: [facility];  Service: Urology;  Laterality: Right;   CYSTOSCOPY/URETEROSCOPY/HOLMIUM LASER/STENT PLACEMENT Right 05/27/2019   Procedure: CYSTOSCOPY/URETEROSCOPY/HOLMIUM LASER/STENT PLACEMENT;  Surgeon: Sondra Come, MD;  Location: [facility];  Service: Urology;  Laterality: Right;   CYSTOSCOPY/URETEROSCOPY/HOLMIUM LASER/STENT PLACEMENT Right 06/17/2019   Procedure: CYSTOSCOPY/URETEROSCOPY/HOLMIUM LASER/STENT Exchange;  Surgeon: Sondra Come, MD;  Location: [facility];  Service: Urology;  Laterality: Right;   CYSTOSCOPY/URETEROSCOPY/HOLMIUM LASER/STENT PLACEMENT Right 07/11/2019   Procedure: CYSTOSCOPY/URETEROSCOPY/HOLMIUM LASER/STENT Exchange;  Surgeon: Sondra Come, MD;  Location: [facility];  Service: Urology;  Laterality: Right;  RIGHT HEART CATH AND CORONARY/GRAFT ANGIOGRAPHY Bilateral 06/17/2022   Procedure: RIGHT HEART CATH AND CORONARY/GRAFT ANGIOGRAPHY;  Surgeon: Yvonne Kendall, MD;  Location: [facility];  Service: Cardiovascular;  Laterality: Bilateral;   STONE EXTRACTION WITH BASKET Right 07/11/2019   Procedure: STONE EXTRACTION WITH BASKET;  Surgeon: Sondra Come, MD;  Location: [facility];  Service: Urology;  Laterality: Right;   TEE WITHOUT CARDIOVERSION N/A 04/21/2019   Procedure: TRANSESOPHAGEAL ECHOCARDIOGRAM (TEE);  Surgeon: Antonieta Iba, MD;  Location: [facility];  Service: Cardiovascular;  Laterality: N/A;   TEE WITHOUT CARDIOVERSION N/A 07/04/2019   Procedure: TRANSESOPHAGEAL ECHOCARDIOGRAM (TEE);  Surgeon: Iran Ouch, MD;  Location:  [facility];  Service: Cardiovascular;  Laterality: N/A;   TEE WITHOUT CARDIOVERSION N/A 08/17/2019   Procedure: TRANSESOPHAGEAL ECHOCARDIOGRAM (TEE);  Surgeon: Debbe Odea, MD;  Location: [facility];  Service: Cardiovascular;  Laterality: N/A;   TEE WITHOUT CARDIOVERSION N/A 11/15/2019   Procedure: TRANSESOPHAGEAL ECHOCARDIOGRAM (TEE);  Surgeon: Yvonne Kendall, MD;  Location: [facility];  Service: Cardiovascular;  Laterality: N/A;   TONSILLECTOMY      SOCIAL HISTORY: Social History   Socioeconomic History   Marital status: Married    Spouse name: Not on file   Number of children: Not on file   Years of education: Not on file   Highest education level: Not on file  Occupational History   Not on file  Tobacco Use   Smoking status: Former   Smokeless tobacco: Never  Vaping Use   Vaping Use: Never used  Substance and Sexual Activity   Alcohol use: Never   Drug use: Never   Sexual activity: Not Currently  Other Topics Concern   Not on file  Social History Narrative   Not on file   Social Determinants of Health   Financial Resource Strain: Not on file  Food Insecurity: Not on file  Transportation Needs: Not on file  Physical Activity: Not on file  Stress: Not on file  Social Connections: Not on file  Intimate Partner Violence: Not on file    FAMILY HISTORY: Family History  Problem Relation Age of Onset   Heart attack Father 47   Heart attack Sister     ALLERGIES:  is allergic to metformin.  MEDICATIONS:  Current Outpatient Medications  Medication Sig Dispense Refill   amiodarone (PACERONE) 200 MG tablet Take 1 tablet by mouth once daily 90 tablet 1   apixaban (ELIQUIS) 5 MG TABS tablet Take 5 mg by mouth 2 (two) times daily.     aspirin EC 81 MG tablet Take 81 mg by mouth daily. Swallow whole.     atorvastatin (LIPITOR) 80 MG tablet Take 1 tablet (80 mg total) by mouth every evening. 90 tablet 3   furosemide (LASIX) 40 MG tablet Take 1 tablet (40 mg total) by  mouth 2 (two) times daily. 180 tablet 3   insulin NPH-regular Human (70-30) 100 UNIT/ML injection Inject 130 Units into the skin 2 (two) times daily.     isosorbide mononitrate (IMDUR) 30 MG 24 hr tablet Take 0.5 tablets (15 mg total) by mouth daily. 45 tablet 3   loratadine (CLARITIN) 10 MG tablet Take 10 mg by mouth daily.     Magnesium Oxide 400 MG CAPS Take 1 capsule (400 mg total) by mouth daily. 90 capsule 3   meclizine (ANTIVERT) 25 MG tablet Take 1 tablet (25 mg total) by mouth 3 (three) times daily as needed for dizziness. 20 tablet 0   methocarbamol (ROBAXIN)  500 MG tablet Take 1 tablet (500 mg total) by mouth every 8 (eight) hours as needed for muscle spasms. 30 tablet 0   metoprolol succinate (TOPROL-XL) 25 MG 24 hr tablet Take 25 mg by mouth 2 (two) times daily.     omeprazole (PRILOSEC) 20 MG capsule Take 40 mg by mouth daily.     ranolazine (RANEXA) 500 MG 12 hr tablet Take 1 tablet (500 mg total) by mouth 2 (two) times daily. 60 tablet 5   tamsulosin (FLOMAX) 0.4 MG CAPS capsule Take 0.4 mg by mouth daily.     nitroGLYCERIN (NITROSTAT) 0.4 MG SL tablet Place 1 tablet (0.4 mg total) under the tongue every 5 (five) minutes x 3 doses as needed for chest pain. (Patient not taking: Reported on 10/21/2022) 30 tablet 1   vitamin B-12 (CYANOCOBALAMIN) 500 MCG tablet Take 1 tablet (500 mcg total) by mouth daily. 90 tablet 1   No current facility-administered medications for this visit.    Review of Systems  Constitutional:  Positive for fatigue. Negative for appetite change, chills and fever.  HENT:   Negative for hearing loss and voice change.   Eyes:  Negative for eye problems and icterus.  Respiratory:  Positive for chest tightness. Negative for cough and shortness of breath.   Cardiovascular:  Negative for chest pain and leg swelling.  Gastrointestinal:  Negative for abdominal distention and abdominal pain.  Endocrine: Negative for hot flashes.  Genitourinary:  Negative for  difficulty urinating, dysuria and frequency.   Musculoskeletal:  Negative for arthralgias.  Skin:  Negative for itching and rash.  Neurological:  Negative for light-headedness and numbness.  Hematological:  Negative for adenopathy. Does not bruise/bleed easily.  Psychiatric/Behavioral:  Negative for confusion.      PHYSICAL EXAMINATION:  Vitals:   10/21/22 1024  BP: 126/62  Pulse: 75  Resp: 18  Temp: (!) 97.5 F (36.4 C)   Filed Weights   10/21/22 1024  Weight: 261 lb 9.6 oz (118.7 kg)    Physical Exam Constitutional:      General: He is not in acute distress.    Appearance: He is obese. He is not diaphoretic.  HENT:     Head: Normocephalic and atraumatic.     Nose: Nose normal.     Mouth/Throat:     Pharynx: No oropharyngeal exudate.  Eyes:     General: No scleral icterus.    Pupils: Pupils are equal, round, and reactive to light.  Cardiovascular:     Rate and Rhythm: Normal rate.  Pulmonary:     Effort: Pulmonary effort is normal. No respiratory distress.     Breath sounds: No wheezing.  Abdominal:     General: Bowel sounds are normal. There is no distension.     Palpations: Abdomen is soft.     Tenderness: There is no abdominal tenderness.  Musculoskeletal:        General: Normal range of motion.     Cervical back: Normal range of motion.  Skin:    General: Skin is warm and dry.     Findings: No erythema.     Comments: Multiple scratches on right lower extremity.  Neurological:     Mental Status: He is alert and oriented to person, place, and time. Mental status is at baseline.     Cranial Nerves: No cranial nerve deficit.     Motor: No abnormal muscle tone.     Coordination: Coordination normal.  Psychiatric:  Mood and Affect: Affect normal.      LABORATORY DATA:  I have reviewed the data as listed    Latest Ref Rng & Units 10/17/2022   10:45 AM 09/09/2022   10:37 AM 08/11/2022   11:38 AM  CBC  WBC 4.0 - 10.5 K/uL 3.3  4.1  3.2   Hemoglobin  13.0 - 17.0 g/dL 27.0  78.6  75.4   Hematocrit 39.0 - 52.0 % 34.2  37.4  34.2   Platelets 150 - 400 K/uL 134  126  126       Latest Ref Rng & Units 08/11/2022   11:38 AM 07/15/2022   10:01 AM 06/26/2022    3:22 PM  CMP  Glucose 70 - 99 mg/dL 492  010  071   BUN 8 - 23 mg/dL 21  21  21    Creatinine 0.61 - 1.24 mg/dL 2.19  7.58  8.32   Sodium 135 - 145 mmol/L 132  137  134   Potassium 3.5 - 5.1 mmol/L 4.3  4.5  4.3   Chloride 98 - 111 mmol/L 97  102  107   CO2 22 - 32 mmol/L 27  27  24    Calcium 8.9 - 10.3 mg/dL 9.1  8.9  8.6   Total Protein 6.5 - 8.1 g/dL 8.0  8.1    Total Bilirubin 0.3 - 1.2 mg/dL 0.9  0.6    Alkaline Phos 38 - 126 U/L 49  58    AST 15 - 41 U/L 22  19    ALT 0 - 44 U/L 23  23       RADIOGRAPHIC STUDIES: I have personally reviewed the radiological images as listed and agreed with the findings in the report. No results found.

## 2022-10-21 NOTE — Progress Notes (Signed)
Pt here for follow up. Reports increased tiredness and weakness.

## 2022-11-06 ENCOUNTER — Ambulatory Visit: Payer: Medicare HMO | Attending: Internal Medicine | Admitting: Internal Medicine

## 2022-11-06 ENCOUNTER — Encounter: Payer: Self-pay | Admitting: Ophthalmology

## 2022-11-06 NOTE — Progress Notes (Deleted)
Follow-up Outpatient Visit Date: 11/06/2022  Primary Care Provider: Marva Panda, MD [address] [place] [address]  Chief Complaint: ***  HPI:  Jonathan Cross is a 77 y.o. male with history of CAD status post CABG x2 (LIMA to the LAD, vein graft to the obtuse marginal), severe aortic stenosis status post bioprosthetic aortic valve replacement complicated by endocarditis, paroxysmal atrial fibrillation, hypertension, hyperlipidemia, type 2 diabetes mellitus, sleep apnea, and BPH, who presents for follow-up of coronary artery disease, aortic valve disease, and paroxysmal atrial fibrillation.  He was last seen in our office in mid December following catheterization for evaluation of accelerating angina.  Cath showed severe native CAD and widely patent LIMA to LAD and SVG to OM2 grafts.  Left heart pressures were moderately elevated.  Right heart pressures and PA pressures were mildly elevated.  Escalation of diuresis and further management of anemia/MDS were recommended.  At his postcatheterization follow-up, he continued to complain of fatigue and shortness of breath.  Chronic chest pain dating back to the time of his CABG/AVR was unchanged.  No medication changes or additional testing were pursued at that time.  He last saw Dr. Cathie Hoops in the heme-onc clinic on 10/21/2022, at which time he continued to complain of fatigue.  She felt this was multifactorial but seem to be out of proportion to his current hemoglobin level.  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 04/2022 Echo: EF 60-65%, no rwma, GrII DD, nl RV fxn, mildly dil LA, mild MR, nl fxn'ing biopros AoV.   Coronary artery disease    a. 01/2016 CABG x 2: LIMA->LAD, VG->OM; b. 05/2021 MV: small, mild, rev apical lateral and apical inf  defects ->subtle ischemia vs artifact, EF 55-65%-->low risk.   Diabetes    H/O aortic valve replacement    a. 01/2016 s/p bioprosthetic AVR @ [facility] for Severe AS; b. 05/2021 Echo: EF 55-60%, nl fxn of bioprosthetic AVR; c. 04/2022 Echo: EF 60-65%, nl fxn'ing AoV.   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 CHA2DS2VASc = 5-->eliquis/amio.   Sleep apnea    Past Surgical History:  Procedure Laterality Date   AORTIC VALVE REPLACEMENT  2017   [facility], bioprosthetic   APPENDECTOMY     BONE MARROW BIOPSY     05/2022   CARDIAC VALVE REPLACEMENT     CORONARY ARTERY BYPASS GRAFT  2017   [facility] - LIMA-LAD and SVG-OM   CYSTOSCOPY W/ RETROGRADES Right 05/27/2019   Procedure: CYSTOSCOPY WITH RETROGRADE PYELOGRAM;  Surgeon: Sondra Come, MD;  Location: [facility];  Service: Urology;  Laterality: Right;   CYSTOSCOPY W/ RETROGRADES Right 07/11/2019   Procedure: CYSTOSCOPY WITH RETROGRADE PYELOGRAM;  Surgeon: Sondra Come, MD;  Location: [facility];  Service: Urology;  Laterality: Right;   CYSTOSCOPY/URETEROSCOPY/HOLMIUM LASER/STENT PLACEMENT Right 05/27/2019   Procedure: CYSTOSCOPY/URETEROSCOPY/HOLMIUM LASER/STENT PLACEMENT;  Surgeon: Sondra Come, MD;  Location: [facility];  Service: Urology;  Laterality: Right;   CYSTOSCOPY/URETEROSCOPY/HOLMIUM LASER/STENT PLACEMENT Right 06/17/2019   Procedure: CYSTOSCOPY/URETEROSCOPY/HOLMIUM LASER/STENT Exchange;  Surgeon: Sondra Come, MD;  Location: [facility];  Service: Urology;  Laterality: Right;   CYSTOSCOPY/URETEROSCOPY/HOLMIUM LASER/STENT PLACEMENT Right 07/11/2019   Procedure: CYSTOSCOPY/URETEROSCOPY/HOLMIUM LASER/STENT Exchange;  Surgeon: Sondra Come, MD;  Location: [facility];  Service: Urology;  Laterality: Right;   RIGHT HEART CATH AND CORONARY/GRAFT ANGIOGRAPHY Bilateral 06/17/2022    Procedure: RIGHT HEART CATH AND CORONARY/GRAFT ANGIOGRAPHY;  Surgeon: Yvonne Kendall, MD;  Location: [facility];  Service: Cardiovascular;  Laterality: Bilateral;   STONE EXTRACTION WITH BASKET Right 07/11/2019   Procedure: STONE EXTRACTION WITH BASKET;  Surgeon: Sondra Come, MD;  Location: [facility];  Service: Urology;  Laterality: Right;   TEE WITHOUT CARDIOVERSION N/A 04/21/2019   Procedure: TRANSESOPHAGEAL ECHOCARDIOGRAM (TEE);  Surgeon: Antonieta Iba, MD;  Location: [facility];  Service: Cardiovascular;  Laterality: N/A;   TEE WITHOUT CARDIOVERSION N/A 07/04/2019   Procedure: TRANSESOPHAGEAL ECHOCARDIOGRAM (TEE);  Surgeon: Iran Ouch, MD;  Location: [facility];  Service: Cardiovascular;  Laterality: N/A;   TEE WITHOUT CARDIOVERSION N/A 08/17/2019   Procedure: TRANSESOPHAGEAL ECHOCARDIOGRAM (TEE);  Surgeon: Debbe Odea, MD;  Location: [facility];  Service: Cardiovascular;  Laterality: N/A;   TEE WITHOUT CARDIOVERSION N/A 11/15/2019   Procedure: TRANSESOPHAGEAL ECHOCARDIOGRAM (TEE);  Surgeon: Yvonne Kendall, MD;  Location: [facility];  Service: Cardiovascular;  Laterality: N/A;   TONSILLECTOMY      No outpatient medications have been marked as taking for the 11/06/22 encounter (Appointment) with Sefora Tietje, Jonathan Deer, MD.    Allergies: Metformin  Social History   Tobacco Use   Smoking status: Former   Smokeless tobacco: Never  Building services engineer Use: Never used  Substance Use Topics   Alcohol use: Never   Drug use: Never    Family History  Problem Relation Age of Onset   Heart attack Father 56   Heart attack Sister     Review of Systems: A 12-system review of systems was performed and was negative except as noted in the HPI.  --------------------------------------------------------------------------------------------------  Physical Exam: There were no vitals taken for this visit.  General:  NAD. Neck: No JVD or HJR. Lungs: Clear to  auscultation bilaterally without wheezes or crackles. Heart: Regular rate and rhythm without murmurs, rubs, or gallops. Abdomen: Soft, nontender, nondistended. Extremities: No lower extremity edema.  EKG:  ***  Lab Results  Component Value Date   WBC 3.3 (L) 10/17/2022   HGB 11.3 (L) 10/17/2022   HCT 34.2 (L) 10/17/2022   MCV 114.0 (H) 10/17/2022   PLT 134 (L) 10/17/2022    Lab Results  Component Value Date   NA 132 (L) 08/11/2022   K 4.3 08/11/2022   CL 97 (L) 08/11/2022   CO2 27 08/11/2022   BUN 21 08/11/2022   CREATININE 1.10 08/11/2022   GLUCOSE 226 (H) 08/11/2022   ALT 23 08/11/2022    Lab Results  Component Value Date   CHOL 92 06/03/2021   HDL 16 (L) 06/03/2021   LDLCALC 49 06/03/2021   TRIG 134 06/03/2021   CHOLHDL 5.8 06/03/2021    --------------------------------------------------------------------------------------------------  ASSESSMENT AND PLAN: Jonathan Cross Murline Weigel, MD 11/06/2022 7:20 AM

## 2022-11-06 NOTE — Anesthesia Preprocedure Evaluation (Addendum)
Anesthesia Evaluation  Patient identified by MRN, date of birth, ID band Patient awake  General Assessment Comment:CPAP machine not working for last couple weeks, trying to get it fixed  Reviewed: Allergy & Precautions, H&P , NPO status , Patient's Chart, lab work & pertinent test results, reviewed documented beta blocker date and time   Airway Mallampati: III  TM Distance: <3 FB Neck ROM: Full    Dental  (+) Upper Dentures, Lower Dentures   Pulmonary sleep apnea and Continuous Positive Airway Pressure Ventilation , former smoker   Pulmonary exam normal breath sounds clear to auscultation       Cardiovascular hypertension, Pt. on home beta blockers and Pt. 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, but can see with peripheral vision on left eye  vertigo CVA  negative psych ROS   GI/Hepatic Neg liver ROS,GERD  ,,  Endo/Other  diabetes, Insulin Dependent    Renal/GU Renal disease   BPH negative genitourinary   Musculoskeletal negative musculoskeletal ROS (+)    Abdominal   Peds negative pediatric ROS (+)  Hematology negative hematology ROS (+) Blood dyscrasia, anemia   Anesthesia Other Findings Hypertension  Coronary artery disease Diabetes  Hx of CABG H/O aortic valve replacement Hyperlipidemia Sleep apnea  History of kidney stones Bacterial endocarditis  PAF (paroxysmal atrial fibrillation) Chronic heart failure with preserved ejection  fraction (HFpEF) MDS (myelodysplastic syndrome), low grade  Retinal vascular occlusion of left eye Wears dentures  Vertigo    Reproductive/Obstetrics negative OB ROS                              Anesthesia Physical Anesthesia Plan  ASA: 4  Anesthesia Plan: MAC   Post-op Pain Management:    Induction: Intravenous  PONV Risk Score and Plan:   Airway Management Planned: Natural Airway and Nasal Cannula  Additional Equipment:   Intra-op Plan:   Post-operative Plan:   Informed Consent: I have reviewed the patients History and Physical, chart, labs and discussed the procedure including the risks, benefits and alternatives for the proposed anesthesia with the patient or authorized representative who has indicated his/her understanding and acceptance.     Dental Advisory Given  Plan Discussed with: Anesthesiologist, CRNA and Surgeon  Anesthesia Plan Comments: (Patient consented for risks of anesthesia including but not limited to:  - adverse reactions to medications - damage to eyes, teeth, lips or other oral mucosa - nerve damage due to positioning  - sore throat or hoarseness - Damage to heart, brain, nerves, lungs, other parts of body or loss of life  Patient voiced understanding.)         Anesthesia Quick Evaluation

## 2022-11-11 NOTE — Discharge Instructions (Signed)

## 2022-11-12 ENCOUNTER — Encounter: Payer: Self-pay | Admitting: Ophthalmology

## 2022-11-12 ENCOUNTER — Ambulatory Visit: Payer: Medicare HMO | Admitting: Anesthesiology

## 2022-11-12 ENCOUNTER — Encounter
Admission: RE | Disposition: A | Discharge status: Home / Self Care | Payer: Self-pay | Source: Home / Self Care | Attending: Ophthalmology

## 2022-11-12 ENCOUNTER — Other Ambulatory Visit: Payer: Self-pay

## 2022-11-12 ENCOUNTER — Ambulatory Visit
Admission: RE | Admit: 2022-11-12 | Discharge: 2022-11-12 | Disposition: A | Discharge status: Home / Self Care | Payer: Medicare HMO | Attending: Ophthalmology | Admitting: Ophthalmology

## 2022-11-12 DIAGNOSIS — Z953 Presence of xenogenic heart valve: Secondary | ICD-10-CM | POA: Insufficient documentation

## 2022-11-12 DIAGNOSIS — Z87891 Personal history of nicotine dependence: Secondary | ICD-10-CM | POA: Insufficient documentation

## 2022-11-12 DIAGNOSIS — E1136 Type 2 diabetes mellitus with diabetic cataract: Secondary | ICD-10-CM | POA: Diagnosis not present

## 2022-11-12 DIAGNOSIS — I2510 Atherosclerotic heart disease of native coronary artery without angina pectoris: Secondary | ICD-10-CM | POA: Insufficient documentation

## 2022-11-12 DIAGNOSIS — D759 Disease of blood and blood-forming organs, unspecified: Secondary | ICD-10-CM | POA: Diagnosis not present

## 2022-11-12 DIAGNOSIS — Z951 Presence of aortocoronary bypass graft: Secondary | ICD-10-CM | POA: Insufficient documentation

## 2022-11-12 DIAGNOSIS — Z8673 Personal history of transient ischemic attack (TIA), and cerebral infarction without residual deficits: Secondary | ICD-10-CM | POA: Diagnosis not present

## 2022-11-12 DIAGNOSIS — I110 Hypertensive heart disease with heart failure: Secondary | ICD-10-CM | POA: Insufficient documentation

## 2022-11-12 DIAGNOSIS — G4730 Sleep apnea, unspecified: Secondary | ICD-10-CM | POA: Insufficient documentation

## 2022-11-12 DIAGNOSIS — K219 Gastro-esophageal reflux disease without esophagitis: Secondary | ICD-10-CM | POA: Diagnosis not present

## 2022-11-12 DIAGNOSIS — D649 Anemia, unspecified: Secondary | ICD-10-CM | POA: Diagnosis not present

## 2022-11-12 DIAGNOSIS — Z794 Long term (current) use of insulin: Secondary | ICD-10-CM | POA: Diagnosis not present

## 2022-11-12 DIAGNOSIS — I5032 Chronic diastolic (congestive) heart failure: Secondary | ICD-10-CM | POA: Diagnosis not present

## 2022-11-12 DIAGNOSIS — H2511 Age-related nuclear cataract, right eye: Secondary | ICD-10-CM | POA: Insufficient documentation

## 2022-11-12 HISTORY — DX: Unspecified visual disturbance: I69.398

## 2022-11-12 HISTORY — DX: Dizziness and giddiness: R42

## 2022-11-12 HISTORY — DX: Presence of dental prosthetic device (complete) (partial): Z97.2

## 2022-11-12 HISTORY — PX: CATARACT EXTRACTION W/PHACO: SHX586

## 2022-11-12 HISTORY — DX: Unspecified retinal vascular occlusion: H34.9

## 2022-11-12 HISTORY — DX: Unspecified visual disturbance: H53.9

## 2022-11-12 LAB — GLUCOSE, CAPILLARY: Glucose-Capillary: 222 mg/dL — ABNORMAL HIGH (ref 70–99)

## 2022-11-12 SURGERY — PHACOEMULSIFICATION, CATARACT, WITH IOL INSERTION
Anesthesia: Monitor Anesthesia Care | Site: Eye | Laterality: Right

## 2022-11-12 MED ORDER — TETRACAINE HCL 0.5 % OP SOLN
1.0000 [drp] | OPHTHALMIC | Status: DC | PRN
  Administered 2022-11-12 (×3): 1 [drp] via OPHTHALMIC

## 2022-11-12 MED ORDER — SIGHTPATH DOSE#1 NA HYALUR & NA CHOND-NA HYALUR IO KIT
PACK | INTRAOCULAR | Status: DC | PRN
  Administered 2022-11-12: 1 via OPHTHALMIC

## 2022-11-12 MED ORDER — FENTANYL CITRATE (PF) 100 MCG/2ML IJ SOLN
INTRAMUSCULAR | Status: DC | PRN
  Administered 2022-11-12: 50 ug via INTRAVENOUS

## 2022-11-12 MED ORDER — LACTATED RINGERS IV SOLN: INTRAVENOUS | Status: DC

## 2022-11-12 MED ORDER — METOPROLOL TARTRATE 5 MG/5ML IV SOLN
INTRAVENOUS | Status: DC | PRN
  Administered 2022-11-12: 2 mg via INTRAVENOUS
  Administered 2022-11-12: 1 mg via INTRAVENOUS

## 2022-11-12 MED ORDER — CEFUROXIME OPHTHALMIC INJECTION 1 MG/0.1 ML
INJECTION | OPHTHALMIC | Status: DC | PRN
  Administered 2022-11-12: .1 mL via INTRACAMERAL

## 2022-11-12 MED ORDER — BRIMONIDINE TARTRATE-TIMOLOL 0.2-0.5 % OP SOLN
OPHTHALMIC | Status: DC | PRN
  Administered 2022-11-12: 1 [drp] via OPHTHALMIC

## 2022-11-12 MED ORDER — SIGHTPATH DOSE#1 BSS IO SOLN
INTRAOCULAR | Status: DC | PRN
  Administered 2022-11-12: 1 mL via INTRAMUSCULAR

## 2022-11-12 MED ORDER — ARMC OPHTHALMIC DILATING DROPS
1.0000 | OPHTHALMIC | Status: DC | PRN
  Administered 2022-11-12 (×3): 1 via OPHTHALMIC

## 2022-11-12 MED ORDER — MIDAZOLAM HCL 2 MG/2ML IJ SOLN
INTRAMUSCULAR | Status: DC | PRN
  Administered 2022-11-12: 1 mg via INTRAVENOUS

## 2022-11-12 MED ORDER — SIGHTPATH DOSE#1 BSS IO SOLN
INTRAOCULAR | Status: DC | PRN
  Administered 2022-11-12: 85 mL via OPHTHALMIC

## 2022-11-12 MED ORDER — SIGHTPATH DOSE#1 BSS IO SOLN
INTRAOCULAR | Status: DC | PRN
  Administered 2022-11-12: 15 mL

## 2022-11-12 SURGICAL SUPPLY — 10 items
CATARACT SUITE SIGHTPATH (MISCELLANEOUS) ×1 IMPLANT
FEE CATARACT SUITE SIGHTPATH (MISCELLANEOUS) ×1 IMPLANT
GLOVE SRG 8 PF TXTR STRL LF DI (GLOVE) ×1 IMPLANT
GLOVE SURG ENC TEXT LTX SZ7.5 (GLOVE) ×1 IMPLANT
GLOVE SURG UNDER POLY LF SZ8 (GLOVE) ×1
LENS IOL TECNIS EYHANCE 22.0 (Intraocular Lens) IMPLANT
NDL FILTER BLUNT 18X1 1/2 (NEEDLE) ×1 IMPLANT
NEEDLE FILTER BLUNT 18X1 1/2 (NEEDLE) ×1 IMPLANT
RING MALYGIN 7.0 (MISCELLANEOUS) IMPLANT
SYR 3ML LL SCALE MARK (SYRINGE) ×1 IMPLANT

## 2022-11-12 NOTE — Anesthesia Postprocedure Evaluation (Signed)
Anesthesia Post Note  Patient: Jonathan Cross.  Procedure(s) Performed: CATARACT EXTRACTION PHACO AND INTRAOCULAR LENS PLACEMENT (IOC) RIGHT DIABETIC MALYUGIN  13.49  01:06.1 (Right: Eye)  Patient location during evaluation: PACU Anesthesia Type: MAC Level of consciousness: awake and alert Pain management: pain level controlled Vital Signs Assessment: post-procedure vital signs reviewed and stable Respiratory status: spontaneous breathing, nonlabored ventilation, respiratory function stable and patient connected to nasal cannula oxygen Cardiovascular status: stable and blood pressure returned to baseline Postop Assessment: no apparent nausea or vomiting Anesthetic complications: no   No notable events documented.   Last Vitals:  Vitals:   11/12/22 0756 11/12/22 0802  BP: 130/82 120/60  Pulse: 67 66  Resp: 18 15  Temp: (!) 36.4 C (!) 36.4 C  SpO2: 95% 93%    Last Pain:  Vitals:   11/12/22 0802  TempSrc:   PainSc: 0-No pain                 Nikash Mortensen C Carrel Leather

## 2022-11-12 NOTE — Op Note (Signed)
OPERATIVE NOTE  Jonathan Cross [id number] 11/12/2022   PREOPERATIVE DIAGNOSIS:    Nuclear Sclerotic Cataract Right eye with miotic pupil.        H25.11  POSTOPERATIVE DIAGNOSIS: Nuclear Sclerotic Cataract Right eye with miotic pupil.          PROCEDURE:  Phacoemusification with posterior chamber intraocular lens placement of the right eye which required pupil stretching with the Malyugin pupil expansion device. Ultrasound time: Procedure(s) with comments: CATARACT EXTRACTION PHACO AND INTRAOCULAR LENS PLACEMENT (IOC) RIGHT DIABETIC MALYUGIN  13.49  01:06.1 (Right) - Diabetic  LENS:   Implant Name Type Inv. Item Serial No. Manufacturer Lot No. LRB No. Used Action  LENS IOL TECNIS EYHANCE 22.0 - [id number] Intraocular Lens LENS IOL TECNIS EYHANCE 22.0 [id number] [employer]  Right 1 Implanted       SURGEON:  Deirdre Evener, MD   ANESTHESIA:  Topical with tetracaine drops and 2% Xylocaine jelly, augmented with 1% preservative-free intracameral lidocaine.   COMPLICATIONS:  None.   DESCRIPTION OF PROCEDURE:  The patient was identified in the holding room and transported to the operating room and placed in the supine position under the operating microscope. Theright eye was identified as the operative eye and it was prepped and draped in the usual sterile ophthalmic fashion.   A 1 millimeter clear-corneal paracentesis was made at the 12:00 position.  0.5 ml of preservative-free 1% lidocaine was injected into the anterior chamber. The anterior chamber was filled with Viscoat viscoelastic.  A 2.4 millimeter keratome was used to make a near-clear corneal incision at the 9:00 position. A Malyugin pupil expander was then placed through the main incision and into the anterior chamber of the eye.  The edge of the iris was secured on the lip of the pupil expander and it was released, thereby expanding the pupil to approximately 7 millimeters for completion of the cataract surgery.   Additional Viscoat was placed in the anterior chamber.  A cystotome and capsulorrhexis forceps were used to make a curvilinear capsulorrhexis.   Balanced salt solution was used to hydrodissect and hydrodelineate the lens nucleus.   Phacoemulsification was used in stop and chop fashion to remove the lens, nucleus and epinucleus.  The remaining cortex was aspirated using the irrigation aspiration handpiece.  Additional Provisc was placed into the eye to distend the capsular bag for lens placement.  A lens was then injected into the capsular bag.  The pupil expanding ring was removed using a Kuglen hook and insertion device. The remaining viscoelastic was aspirated from the capsular bag and the anterior chamber.  The anterior chamber was filled with balanced salt solution to inflate to a physiologic pressure.  Wounds were hydrated with balanced salt solution.  The anterior chamber was inflated to a physiologic pressure with balanced salt solution.  No wound leaks were noted.Cefuroxime 0.1 ml of a 10mg /ml solution was injected into the anterior chamber for a dose of 1 mg of intracameral antibiotic at the completion of the case. Timolol and Brimonidine drops were applied to the eye.  The patient was taken to the recovery room in stable condition without complications of anesthesia or surgery.  Jonathan Cross 11/12/2022, 7:54 AM

## 2022-11-12 NOTE — H&P (Signed)
[facility]   Primary Care Physician:  Marva Panda, MD Ophthalmologist: Dr. Lockie Mola  Pre-Procedure History & Physical: HPI:  Jonathan Cross. is a 77 y.o. male here for ophthalmic surgery.   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 04/2022 Echo: EF 60-65%, no rwma, GrII DD, nl RV fxn, mildly dil LA, mild MR, nl fxn'ing biopros AoV.   Coronary artery disease    a. 01/2016 CABG x 2: LIMA->LAD, VG->OM; b. 05/2021 MV: small, mild, rev apical lateral and apical inf defects ->subtle ischemia vs artifact, EF 55-65%-->low risk.   CVA, old, disturbances of vision    Diabetes (HCC)    H/O aortic valve replacement    a. 01/2016 s/p bioprosthetic AVR @ [facility]; b. 05/2021 Echo: EF 55-60%, nl fxn of bioprosthetic AVR; c. 04/2022 Echo: EF 60-65%, nl fxn'ing AoV.   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 CHA2DS2VASc = 5-->eliquis/amio.   Retinal vascular occlusion of left eye    Several years ago   Sleep apnea    BiPAP   Vertigo    Wears dentures    full upper and lower.  Lower "broken"    Past Surgical History:  Procedure Laterality Date   AORTIC VALVE REPLACEMENT  2017   [facility], bioprosthetic   APPENDECTOMY     BONE MARROW BIOPSY     05/2022   CARDIAC VALVE REPLACEMENT     CORONARY ARTERY BYPASS GRAFT  2017   [facility] - LIMA-LAD and SVG-OM   CYSTOSCOPY W/ RETROGRADES Right 05/27/2019   Procedure: CYSTOSCOPY WITH RETROGRADE PYELOGRAM;  Surgeon: Sondra Come, MD;  Location: [facility];  Service: Urology;  Laterality: Right;   CYSTOSCOPY W/ RETROGRADES Right 07/11/2019   Procedure: CYSTOSCOPY WITH RETROGRADE PYELOGRAM;  Surgeon: Sondra Come, MD;  Location:  [facility];  Service: Urology;  Laterality: Right;   CYSTOSCOPY/URETEROSCOPY/HOLMIUM LASER/STENT PLACEMENT Right 05/27/2019   Procedure: CYSTOSCOPY/URETEROSCOPY/HOLMIUM LASER/STENT PLACEMENT;  Surgeon: Sondra Come, MD;  Location: [facility];  Service: Urology;  Laterality: Right;   CYSTOSCOPY/URETEROSCOPY/HOLMIUM LASER/STENT PLACEMENT Right 06/17/2019   Procedure: CYSTOSCOPY/URETEROSCOPY/HOLMIUM LASER/STENT Exchange;  Surgeon: Sondra Come, MD;  Location: [facility];  Service: Urology;  Laterality: Right;   CYSTOSCOPY/URETEROSCOPY/HOLMIUM LASER/STENT PLACEMENT Right 07/11/2019   Procedure: CYSTOSCOPY/URETEROSCOPY/HOLMIUM LASER/STENT Exchange;  Surgeon: Sondra Come, MD;  Location: [facility];  Service: Urology;  Laterality: Right;   RIGHT HEART CATH AND CORONARY/GRAFT ANGIOGRAPHY Bilateral 06/17/2022   Procedure: RIGHT HEART CATH AND CORONARY/GRAFT ANGIOGRAPHY;  Surgeon: Yvonne Kendall, MD;  Location: [facility];  Service: Cardiovascular;  Laterality: Bilateral;   STONE EXTRACTION WITH BASKET Right 07/11/2019   Procedure: STONE EXTRACTION WITH BASKET;  Surgeon: Sondra Come, MD;  Location: [facility];  Service: Urology;  Laterality: Right;   TEE WITHOUT CARDIOVERSION N/A 04/21/2019   Procedure: TRANSESOPHAGEAL ECHOCARDIOGRAM (TEE);  Surgeon: Antonieta Iba, MD;  Location: [facility];  Service: Cardiovascular;  Laterality: N/A;   TEE WITHOUT CARDIOVERSION N/A 07/04/2019   Procedure: TRANSESOPHAGEAL ECHOCARDIOGRAM (TEE);  Surgeon: Iran Ouch, MD;  Location: [facility];  Service: Cardiovascular;  Laterality: N/A;   TEE WITHOUT CARDIOVERSION N/A 08/17/2019   Procedure: TRANSESOPHAGEAL  ECHOCARDIOGRAM (TEE);  Surgeon: Debbe Odea, MD;  Location: [facility];  Service: Cardiovascular;  Laterality: N/A;   TEE WITHOUT CARDIOVERSION N/A 11/15/2019   Procedure: TRANSESOPHAGEAL ECHOCARDIOGRAM (TEE);  Surgeon: Yvonne Kendall, MD;  Location: [facility];  Service: Cardiovascular;   Laterality: N/A;   TONSILLECTOMY      Prior to Admission medications   Medication Sig Start Date End Date Taking? Authorizing Provider  amiodarone (PACERONE) 200 MG tablet Take 1 tablet by mouth once daily 07/16/22  Yes End, Cristal Deer, MD  apixaban (ELIQUIS) 5 MG TABS tablet Take 5 mg by mouth 2 (two) times daily.   Yes [provider]  aspirin EC 81 MG tablet Take 81 mg by mouth daily. Swallow whole.   Yes [provider]  atorvastatin (LIPITOR) 80 MG tablet Take 1 tablet (80 mg total) by mouth every evening. 11/01/21  Yes Creig Hines, NP  furosemide (LASIX) 40 MG tablet Take 1 tablet (40 mg total) by mouth 2 (two) times daily. 06/23/22 06/23/23 Yes End, Cristal Deer, MD  insulin NPH-regular Human (70-30) 100 UNIT/ML injection Inject 130 Units into the skin 2 (two) times daily.   Yes [provider]  Magnesium Oxide 400 MG CAPS Take 1 capsule (400 mg total) by mouth daily. 11/01/21  Yes Creig Hines, NP  meclizine (ANTIVERT) 25 MG tablet Take 1 tablet (25 mg total) by mouth 3 (three) times daily as needed for dizziness. 09/13/21  Yes Burnadette Pop, MD  methocarbamol (ROBAXIN) 500 MG tablet Take 1 tablet (500 mg total) by mouth every 8 (eight) hours as needed for muscle spasms. 08/12/22  Yes Sreenath, Sudheer B, MD  metoprolol succinate (TOPROL-XL) 25 MG 24 hr tablet Take 25 mg by mouth 2 (two) times daily.   Yes [provider]  omeprazole (PRILOSEC) 20 MG capsule Take 40 mg by mouth daily. 07/25/22  Yes [provider]  ranolazine (RANEXA) 500 MG 12 hr tablet Take 1 tablet (500 mg total) by mouth 2 (two) times daily. 04/03/22  Yes Furth, Cadence H, PA-C  tamsulosin (FLOMAX) 0.4 MG CAPS capsule Take 0.4 mg by mouth daily. 03/06/21  Yes [provider]  vitamin B-12 (CYANOCOBALAMIN) 500 MCG tablet Take 1 tablet (500 mcg total) by mouth daily. 10/21/22  Yes [name], MD  isosorbide mononitrate (IMDUR) 30 MG 24 hr tablet Take  0.5 tablets (15 mg total) by mouth daily. Patient not taking: Reported on 11/06/2022 11/01/21   Creig Hines, NP  loratadine (CLARITIN) 10 MG tablet Take 10 mg by mouth daily. Patient not taking: Reported on 11/06/2022 09/13/18   [provider]  nitroGLYCERIN (NITROSTAT) 0.4 MG SL tablet Place 1 tablet (0.4 mg total) under the tongue every 5 (five) minutes x 3 doses as needed for chest pain. 08/12/22   Tresa Moore, MD    Allergies as of 10/27/2022 - Review Complete 10/21/2022  Allergen Reaction Noted   Metformin Diarrhea 03/24/2018    Family History  Problem Relation Age of Onset   Heart attack Father 2   Heart attack Sister     Social History   Socioeconomic History   Marital status: Married    Spouse name: Not on file   Number of children: Not on file   Years of education: Not on file   Highest education level: Not on file  Occupational History   Not on file  Tobacco Use   Smoking status: Former    Types: Cigarettes    Quit date: 1970  Years since quitting: 54.3   Smokeless tobacco: Never  Vaping Use   Vaping Use: Never used  Substance and Sexual Activity   Alcohol use: Never   Drug use: Never   Sexual activity: Not Currently  Other Topics Concern   Not on file  Social History Narrative   Not on file   Social Determinants of Health   Financial Resource Strain: Not on file  Food Insecurity: Not on file  Transportation Needs: Not on file  Physical Activity: Not on file  Stress: Not on file  Social Connections: Not on file  Intimate Partner Violence: Not on file    Review of Systems: See HPI, otherwise negative ROS  Physical Exam: BP (!) 162/80   Pulse 84   Temp 97.9 F (36.6 C) (Temporal)   Resp 18   Ht 5\' 9"  (1.753 m)   Wt 118.4 kg   SpO2 95%   BMI 38.54 kg/m  General:   Alert,  pleasant and cooperative in NAD Head:  Normocephalic and atraumatic. Lungs:  Clear to auscultation.    Heart:  Regular rate and rhythm.    Impression/Plan: Cephus Shelling. is here for ophthalmic surgery.  Risks, benefits, limitations, and alternatives regarding ophthalmic surgery have been reviewed with the patient.  Questions have been answered.  All parties agreeable.   Lockie Mola, MD  11/12/2022, 7:29 AM

## 2022-11-12 NOTE — Transfer of Care (Signed)
Immediate Anesthesia Transfer of Care Note  Patient: Jonathan Cross.  Procedure(s) Performed: CATARACT EXTRACTION PHACO AND INTRAOCULAR LENS PLACEMENT (IOC) RIGHT DIABETIC MALYUGIN  13.49  01:06.1 (Right: Eye)  Patient Location: PACU  Anesthesia Type: MAC  Level of Consciousness: awake, alert  and patient cooperative  Airway and Oxygen Therapy: Patient Spontanous Breathing and Patient connected to supplemental oxygen  Post-op Assessment: Post-op Vital signs reviewed, Patient's Cardiovascular Status Stable, Respiratory Function Stable, Patent Airway and No signs of Nausea or vomiting  Post-op Vital Signs: Reviewed and stable  Complications: No notable events documented.

## 2022-11-18 ENCOUNTER — Other Ambulatory Visit: Payer: Self-pay | Admitting: Internal Medicine

## 2022-12-04 ENCOUNTER — Telehealth: Payer: Self-pay | Admitting: Internal Medicine

## 2022-12-04 NOTE — Telephone Encounter (Signed)
Patient's daughter is calling to request call back to discuss past results. Please advise.

## 2022-12-04 NOTE — Telephone Encounter (Signed)
Patients daughter calling to review some information they received from his PCP. They were not aware of issues with his valves and she just wants some answers as to how her father is doing. Attempted to review information but she continued to inquire about previous testing and wants to discuss this with provider. Scheduled appointment for patient and she was very appreciative.

## 2022-12-08 ENCOUNTER — Emergency Department: Payer: Medicare HMO

## 2022-12-08 ENCOUNTER — Encounter: Payer: Self-pay | Admitting: Medical Oncology

## 2022-12-08 ENCOUNTER — Inpatient Hospital Stay
Admission: EM | Admit: 2022-12-08 | Discharge: 2022-12-14 | DRG: 303 | Disposition: A | Discharge status: Home / Self Care | Payer: Medicare HMO | Attending: Internal Medicine | Admitting: Internal Medicine

## 2022-12-08 DIAGNOSIS — N179 Acute kidney failure, unspecified: Secondary | ICD-10-CM

## 2022-12-08 DIAGNOSIS — Z7901 Long term (current) use of anticoagulants: Secondary | ICD-10-CM

## 2022-12-08 DIAGNOSIS — I10 Essential (primary) hypertension: Secondary | ICD-10-CM | POA: Diagnosis present

## 2022-12-08 DIAGNOSIS — Z91148 Patient's other noncompliance with medication regimen for other reason: Secondary | ICD-10-CM

## 2022-12-08 DIAGNOSIS — I071 Rheumatic tricuspid insufficiency: Secondary | ICD-10-CM | POA: Diagnosis present

## 2022-12-08 DIAGNOSIS — I25118 Atherosclerotic heart disease of native coronary artery with other forms of angina pectoris: Principal | ICD-10-CM | POA: Diagnosis present

## 2022-12-08 DIAGNOSIS — G4733 Obstructive sleep apnea (adult) (pediatric): Secondary | ICD-10-CM | POA: Diagnosis present

## 2022-12-08 DIAGNOSIS — D539 Nutritional anemia, unspecified: Secondary | ICD-10-CM | POA: Diagnosis present

## 2022-12-08 DIAGNOSIS — R0789 Other chest pain: Secondary | ICD-10-CM | POA: Diagnosis not present

## 2022-12-08 DIAGNOSIS — Z8673 Personal history of transient ischemic attack (TIA), and cerebral infarction without residual deficits: Secondary | ICD-10-CM

## 2022-12-08 DIAGNOSIS — R079 Chest pain, unspecified: Secondary | ICD-10-CM | POA: Diagnosis present

## 2022-12-08 DIAGNOSIS — Z953 Presence of xenogenic heart valve: Secondary | ICD-10-CM

## 2022-12-08 DIAGNOSIS — E871 Hypo-osmolality and hyponatremia: Secondary | ICD-10-CM | POA: Diagnosis present

## 2022-12-08 DIAGNOSIS — Z794 Long term (current) use of insulin: Secondary | ICD-10-CM

## 2022-12-08 DIAGNOSIS — Z87891 Personal history of nicotine dependence: Secondary | ICD-10-CM

## 2022-12-08 DIAGNOSIS — E669 Obesity, unspecified: Secondary | ICD-10-CM | POA: Diagnosis present

## 2022-12-08 DIAGNOSIS — I4510 Unspecified right bundle-branch block: Secondary | ICD-10-CM | POA: Diagnosis present

## 2022-12-08 DIAGNOSIS — I110 Hypertensive heart disease with heart failure: Secondary | ICD-10-CM | POA: Diagnosis present

## 2022-12-08 DIAGNOSIS — L89322 Pressure ulcer of left buttock, stage 2: Secondary | ICD-10-CM | POA: Diagnosis present

## 2022-12-08 DIAGNOSIS — Z7982 Long term (current) use of aspirin: Secondary | ICD-10-CM

## 2022-12-08 DIAGNOSIS — Z79899 Other long term (current) drug therapy: Secondary | ICD-10-CM

## 2022-12-08 DIAGNOSIS — L89312 Pressure ulcer of right buttock, stage 2: Secondary | ICD-10-CM | POA: Diagnosis present

## 2022-12-08 DIAGNOSIS — G8929 Other chronic pain: Secondary | ICD-10-CM | POA: Diagnosis present

## 2022-12-08 DIAGNOSIS — Z8679 Personal history of other diseases of the circulatory system: Secondary | ICD-10-CM

## 2022-12-08 DIAGNOSIS — Z8249 Family history of ischemic heart disease and other diseases of the circulatory system: Secondary | ICD-10-CM

## 2022-12-08 DIAGNOSIS — E1165 Type 2 diabetes mellitus with hyperglycemia: Secondary | ICD-10-CM

## 2022-12-08 DIAGNOSIS — L8990 Pressure ulcer of unspecified site, unspecified stage: Secondary | ICD-10-CM | POA: Insufficient documentation

## 2022-12-08 DIAGNOSIS — I4892 Unspecified atrial flutter: Secondary | ICD-10-CM | POA: Diagnosis present

## 2022-12-08 DIAGNOSIS — D61818 Other pancytopenia: Secondary | ICD-10-CM | POA: Diagnosis present

## 2022-12-08 DIAGNOSIS — I5032 Chronic diastolic (congestive) heart failure: Secondary | ICD-10-CM | POA: Diagnosis present

## 2022-12-08 DIAGNOSIS — Z952 Presence of prosthetic heart valve: Secondary | ICD-10-CM

## 2022-12-08 DIAGNOSIS — Z888 Allergy status to other drugs, medicaments and biological substances status: Secondary | ICD-10-CM

## 2022-12-08 DIAGNOSIS — Z6837 Body mass index (BMI) 37.0-37.9, adult: Secondary | ICD-10-CM

## 2022-12-08 DIAGNOSIS — D469 Myelodysplastic syndrome, unspecified: Secondary | ICD-10-CM | POA: Diagnosis present

## 2022-12-08 DIAGNOSIS — I480 Paroxysmal atrial fibrillation: Secondary | ICD-10-CM | POA: Diagnosis present

## 2022-12-08 DIAGNOSIS — R0603 Acute respiratory distress: Secondary | ICD-10-CM | POA: Diagnosis present

## 2022-12-08 DIAGNOSIS — E785 Hyperlipidemia, unspecified: Secondary | ICD-10-CM | POA: Diagnosis present

## 2022-12-08 DIAGNOSIS — Z951 Presence of aortocoronary bypass graft: Secondary | ICD-10-CM

## 2022-12-08 LAB — COMPREHENSIVE METABOLIC PANEL
ALT: 27 U/L (ref 0–44)
AST: 27 U/L (ref 15–41)
Albumin: 3.7 g/dL (ref 3.5–5.0)
Alkaline Phosphatase: 51 U/L (ref 38–126)
Anion gap: 11 (ref 5–15)
BUN: 21 mg/dL (ref 8–23)
CO2: 24 mmol/L (ref 22–32)
Calcium: 8.6 mg/dL — ABNORMAL LOW (ref 8.9–10.3)
Chloride: 97 mmol/L — ABNORMAL LOW (ref 98–111)
Creatinine, Ser: 1.39 mg/dL — ABNORMAL HIGH (ref 0.61–1.24)
GFR, Estimated: 52 mL/min — ABNORMAL LOW (ref >60)
Glucose, Bld: 350 mg/dL — ABNORMAL HIGH (ref 70–99)
Potassium: 4 mmol/L (ref 3.5–5.1)
Sodium: 132 mmol/L — ABNORMAL LOW (ref 135–145)
Total Bilirubin: 0.8 mg/dL (ref 0.3–1.2)
Total Protein: 7.3 g/dL (ref 6.5–8.1)

## 2022-12-08 LAB — URINALYSIS, ROUTINE W REFLEX MICROSCOPIC
Bacteria, UA: NONE SEEN
Bilirubin Urine: NEGATIVE
Glucose, UA: >=500 mg/dL — AB
Hgb urine dipstick: NEGATIVE
Ketones, ur: NEGATIVE mg/dL
Leukocytes,Ua: NEGATIVE
Nitrite: NEGATIVE
Protein, ur: NEGATIVE mg/dL
Specific Gravity, Urine: >1.046 — ABNORMAL HIGH (ref 1.005–1.030)
pH: 6 (ref 5.0–8.0)

## 2022-12-08 LAB — CBC WITH DIFFERENTIAL/PLATELET
Abs Immature Granulocytes: 0.02 10*3/uL (ref 0.00–0.07)
Basophils Absolute: 0.1 10*3/uL (ref 0.0–0.1)
Basophils Relative: 2 %
Eosinophils Absolute: 0.1 10*3/uL (ref 0.0–0.5)
Eosinophils Relative: 2 %
HCT: 30.7 % — ABNORMAL LOW (ref 39.0–52.0)
Hemoglobin: 9.9 g/dL — ABNORMAL LOW (ref 13.0–17.0)
Immature Granulocytes: 1 %
Lymphocytes Relative: 42 %
Lymphs Abs: 1.2 10*3/uL (ref 0.7–4.0)
MCH: 36.9 pg — ABNORMAL HIGH (ref 26.0–34.0)
MCHC: 32.2 g/dL (ref 30.0–36.0)
MCV: 114.6 fL — ABNORMAL HIGH (ref 80.0–100.0)
Monocytes Absolute: 0.3 10*3/uL (ref 0.1–1.0)
Monocytes Relative: 12 %
Neutro Abs: 1.1 10*3/uL — ABNORMAL LOW (ref 1.7–7.7)
Neutrophils Relative %: 41 %
Platelets: 133 10*3/uL — ABNORMAL LOW (ref 150–400)
RBC: 2.68 MIL/uL — ABNORMAL LOW (ref 4.22–5.81)
RDW: 15.3 % (ref 11.5–15.5)
Smear Review: NORMAL
WBC: 2.8 10*3/uL — ABNORMAL LOW (ref 4.0–10.5)
nRBC: 0 % (ref 0.0–0.2)

## 2022-12-08 LAB — URINE DRUG SCREEN, QUALITATIVE (ARMC ONLY)
Amphetamines, Ur Screen: NOT DETECTED
Barbiturates, Ur Screen: NOT DETECTED
Benzodiazepine, Ur Scrn: NOT DETECTED
Cannabinoid 50 Ng, Ur ~~LOC~~: NOT DETECTED
Cocaine Metabolite,Ur ~~LOC~~: NOT DETECTED
MDMA (Ecstasy)Ur Screen: NOT DETECTED
Methadone Scn, Ur: NOT DETECTED
Opiate, Ur Screen: NOT DETECTED
Phencyclidine (PCP) Ur S: NOT DETECTED
Tricyclic, Ur Screen: NOT DETECTED

## 2022-12-08 LAB — TROPONIN I (HIGH SENSITIVITY)
Troponin I (High Sensitivity): 12 ng/L (ref <18)
Troponin I (High Sensitivity): 12 ng/L (ref <18)
Troponin I (High Sensitivity): 12 ng/L (ref <18)

## 2022-12-08 LAB — CBG MONITORING, ED: Glucose-Capillary: 221 mg/dL — ABNORMAL HIGH (ref 70–99)

## 2022-12-08 LAB — BRAIN NATRIURETIC PEPTIDE: B Natriuretic Peptide: 39.2 pg/mL (ref 0.0–100.0)

## 2022-12-08 MED ORDER — ONDANSETRON HCL 4 MG/2ML IJ SOLN: 4.0000 mg | Freq: Four times a day (QID) | INTRAMUSCULAR | Status: DC | PRN

## 2022-12-08 MED ORDER — IOHEXOL 350 MG/ML SOLN
100.0000 mL | Freq: Once | INTRAVENOUS | Status: AC | PRN
  Administered 2022-12-08: 100 mL via INTRAVENOUS

## 2022-12-08 MED ORDER — NITROGLYCERIN 0.4 MG SL SUBL
0.4000 mg | SUBLINGUAL_TABLET | SUBLINGUAL | Status: DC | PRN
  Administered 2022-12-14 (×3): 0.4 mg via SUBLINGUAL
  Filled 2022-12-08: qty 1

## 2022-12-08 MED ORDER — INSULIN ASPART 100 UNIT/ML IJ SOLN
0.0000 [IU] | Freq: Every day | INTRAMUSCULAR | Status: DC
  Administered 2022-12-10: 4 [IU] via SUBCUTANEOUS
  Administered 2022-12-11 – 2022-12-12 (×2): 3 [IU] via SUBCUTANEOUS
  Administered 2022-12-13: 2 [IU] via SUBCUTANEOUS
  Filled 2022-12-08 (×4): qty 1

## 2022-12-08 MED ORDER — ONDANSETRON HCL 4 MG/2ML IJ SOLN: 4.0000 mg | Freq: Once | INTRAMUSCULAR | Status: DC

## 2022-12-08 MED ORDER — SODIUM CHLORIDE 0.9 % IV SOLN: INTRAVENOUS | Status: DC

## 2022-12-08 MED ORDER — ACETAMINOPHEN 325 MG PO TABS
650.0000 mg | ORAL_TABLET | ORAL | Status: DC | PRN
  Administered 2022-12-09: 650 mg via ORAL
  Filled 2022-12-08: qty 2

## 2022-12-08 MED ORDER — INSULIN ASPART 100 UNIT/ML IJ SOLN
0.0000 [IU] | Freq: Three times a day (TID) | INTRAMUSCULAR | Status: DC
  Administered 2022-12-09: 7 [IU] via SUBCUTANEOUS
  Administered 2022-12-09: 11 [IU] via SUBCUTANEOUS
  Administered 2022-12-09: 7 [IU] via SUBCUTANEOUS
  Administered 2022-12-10: 11 [IU] via SUBCUTANEOUS
  Administered 2022-12-10 – 2022-12-11 (×3): 7 [IU] via SUBCUTANEOUS
  Administered 2022-12-11: 15 [IU] via SUBCUTANEOUS
  Administered 2022-12-11 – 2022-12-12 (×2): 7 [IU] via SUBCUTANEOUS
  Administered 2022-12-12: 15 [IU] via SUBCUTANEOUS
  Administered 2022-12-12 – 2022-12-13 (×2): 4 [IU] via SUBCUTANEOUS
  Administered 2022-12-13: 11 [IU] via SUBCUTANEOUS
  Administered 2022-12-13 – 2022-12-14 (×2): 7 [IU] via SUBCUTANEOUS
  Administered 2022-12-14: 4 [IU] via SUBCUTANEOUS
  Filled 2022-12-08 (×18): qty 1

## 2022-12-08 NOTE — ED Triage Notes (Signed)
Pt from home via ems with reports of central chest pain that began off and on last couple weeks. Pt also reports sob.

## 2022-12-08 NOTE — Assessment & Plan Note (Signed)
A flutter On EKG but Rate Controlled Continue Amiodarone Continue Eliquis

## 2022-12-08 NOTE — ED Provider Notes (Signed)
[facility] Provider Note    Event Date/Time   First MD Initiated Contact with Patient 12/08/22 1457     (approximate)   History   Chest Pain   HPI  Jonathan Cross. is a 77 y.o. male with diabetes who comes in with intermittent chest pain and some shortness of breath over the past couple of weeks.  Patient has a history of myelodysplastic syndrome.  He reports 4 weeks now having intermittent exertional shortness of breath.  He states that he did have some chest pain this morning took some nitro.  When EMS came they noted his blood sugar was elevated but he had not taken his morning insulin yet.  He states that he did take it prior to coming in.  He states that he may not be compliant with all of his medications he is not sure if he has been taking his Eliquis.  He denies any black tarry stool.  He denies any current chest pain.  He states that maybe he has had some increasing swelling in his abdomen but is not really sure.  He is not the best historian.  His daughter is at bedside who was concerned that maybe he did seem a little bit more confused than normal but the biggest concern was the shortness of breath.  He is got known coronary disease that cardiology has been following him.  I reviewed patient's discharge summary where patient has a history of bioprosthetic AVR.  Patient had troponins that were flat EKG that was nonischemic and had a recent catheterization in early December with multivessel coronary artery disease and cardiology recommended aggressive medical management at that time.  Patient was discharged on 08/12/2022.  Patient is followed by [facility].  Patient is on Eliquis.   Physical Exam   Triage Vital Signs: ED Triage Vitals  Enc Vitals Group     BP 12/08/22 1438 139/69     Pulse Rate 12/08/22 1438 80     Resp 12/08/22 1438 20     Temp 12/08/22 1440 97.7 F (36.5 C)     Temp Source 12/08/22 1440 Oral     SpO2 12/08/22 1438 97 %      Weight 12/08/22 1439 260 lb 2.3 oz (118 kg)     Height 12/08/22 1439 5\' 9"  (1.753 m)     Head Circumference --      Peak Flow --      Pain Score 12/08/22 1439 0     Pain Loc --      Pain Edu? --      Excl. in GC? --     Most recent vital signs: Vitals:   12/08/22 1448 12/08/22 1449  BP:    Pulse:  69  Resp:  16  Temp:    SpO2: 97%      General: Awake, no distress.  CV:  Good peripheral perfusion.  Resp:  Normal effort.  Abd:  Slight distention but nontender Other:  Trace edema noted good distal pulses of the feet.  No wounds noted on the feet but a lot of dirt noted.   ED Results / Procedures / Treatments   Labs (all labs ordered are listed, but only abnormal results are displayed) Labs Reviewed  CBC WITH DIFFERENTIAL/PLATELET - Abnormal; Notable for the following components:      Result Value   WBC 2.8 (*)    RBC 2.68 (*)    Hemoglobin 9.9 (*)    HCT 30.7 (*)  MCV 114.6 (*)    MCH 36.9 (*)    Platelets 133 (*)    All other components within normal limits  COMPREHENSIVE METABOLIC PANEL  TROPONIN I (HIGH SENSITIVITY)     EKG  My interpretation of EKG:  Normal sinus rate of 72 without any ST elevation or T wave inversions, normal intervals  Repeat EKG does have maybe some signs of atrial flutter with a rate of 64 but then it looks like he goes back into normal sinus without any obvious ST elevation or T wave inversions.  QTc prolonged at 545  RADIOLOGY I have reviewed the CT head personally interpreted no evidence of intracranial hemorrhage  PROCEDURES:  Critical Care performed: No  .1-3 Lead EKG Interpretation  Performed by: Concha Se, MD Authorized by: Concha Se, MD     Interpretation: normal     ECG rate:  60   ECG rate assessment: normal     Rhythm: sinus rhythm     Ectopy: none     Conduction: normal      MEDICATIONS ORDERED IN ED: Medications  iohexol (OMNIPAQUE) 350 MG/ML injection 100 mL (100 mLs Intravenous Contrast Given  12/08/22 1614)     IMPRESSION / MDM / ASSESSMENT AND PLAN / ED COURSE  I reviewed the triage vital signs and the nursing notes.   Patient's presentation is most consistent with acute presentation with potential threat to life or bodily function.   Differential includes ACS, CHF, PE given concern for potentially noncompliance of Eliquis.  Given patient is potentially noncompliant will add on CT PE to rule out pulmonary embolism given his EKG and initial troponin are nonischemic.  Will also get a CT abdomen given he does seem somewhat distended although he certainly denies any significant pain and a CT head to rule out any evidence of any bleed given he does seem somewhat confused about his medical history.  He currently denies any pain and is comfortably resting in bed.  CBC shows low hemoglobin of 9.9.  He reports having hemoglobins less than 8 in the past when he was diagnosed with myelodysplastic syndrome and requiring transfusion.  He denies any black stool or blood in his stool.  He declined rectal exam to confirm this.  His CMP shows elevated glucose of 350 with slightly elevated creatinine.  Holding off on fluids due to concern for may be some fluid overload.  He is already taken some insulin before coming in.  His initial troponin was negative we will get a repeat.  CT imaging is overall reassuring.  There are some several hypodense lesions throughout the liver which I did discuss with patient he follows up outpatient with PCP.  CT imaging to his atherosclerotic disease post CABG.  No PE.  CT imaging of head shows remote parietal infarct  Updated them on CT imaging and incidental findings with that they expressed understanding.  We discussed reassuring workup however they did report that he had another episode of chest discomfort and feeling like his heart rates were racing went up into the 90s.  On review of the telemetry it does look like he might be going into an atrial flutter but then he  seems to break out of it.  Does have a history of A-fib he has been rate controlled.  We discussed admission versus discharge and they are concerned that there have been no answers for why he has this chest discomfort and why his blood pressures have been more elevated recently.  Therefore will admit to the hospital team for cardiology GI evaluation tomorrow consideration may be of a repeat echo in case this could be with his valve and for cardiac monitoring to ensure no evidence of atrial flutter.  On my repeat evaluation patient is chest pain-free and reports feeling better overall.  The patient is on the cardiac monitor to evaluate for evidence of arrhythmia and/or significant heart rate changes.      FINAL CLINICAL IMPRESSION(S) / ED DIAGNOSES   Final diagnoses:  Atypical chest pain     Rx / DC Orders   ED Discharge Orders     None        Note:  This document was prepared using Dragon voice recognition software and may include unintentional dictation errors.   Concha Se, MD 12/08/22 2038

## 2022-12-08 NOTE — ED Notes (Signed)
Lamont from lab confirms 2nd trop WAS run off new tube that was collected at 1622. States both results from 1440 AND 1622 are "12". EDP Funke notified.

## 2022-12-08 NOTE — ED Notes (Signed)
Called lab to add on BNP. Jaynie Collins states will add on now.

## 2022-12-08 NOTE — ED Notes (Signed)
EDP Funke at bedside. Visitor at bedside.

## 2022-12-08 NOTE — Assessment & Plan Note (Signed)
Undetermined etiology, appears atypical History of CAD s/p CABG with patent grafts 06/2022, history of MDS, history of endocarditis Pain was determined to be noncardiac during her hospitalization in January 2024 Given negative troponins and nonacute EKG will hold off on heparin Will get sed rate Echocardiogram in the a.m. Cardiology consult

## 2022-12-08 NOTE — ED Notes (Signed)
Repeated trop sent to lab.

## 2022-12-08 NOTE — ED Notes (Signed)
Pt's skin dry, resp reg/unlabored, calmly sitting on stretcher; pt's abdomen distended but soft when palpated; pt denies distention being abnormal for him.

## 2022-12-08 NOTE — H&P (Signed)
History and Physical    Patient: Jonathan Cross. ZOX:[id number] DOB: 03-23-1946 DOA: 12/08/2022 DOS: the patient was seen and examined on 12/08/2022 PCP: Marva Panda, MD  Patient coming from: Home  Chief Complaint:  Chief Complaint  Patient presents with   Chest Pain    HPI: Jonathan Cross. is a 77 y.o. male with medical history significant for CAD status post two-vessel CABG, severe aortic stenosis status post bioprosthetic AVR complicated by endocarditis, paroxysmal atrial fibrillation on Eliquis, chronic HFpEF, hypertension, hyperlipidemia, type 2 diabetes mellitus, myelodysplastic syndrome, obesity, and sleep apnea, who presents to the ED for evaluation of exertional chest pain and shortness of breath.  Patient has been having the same for several years, since his valve replacement and tonight it was the same pain just a little more severe.  Patient underwent cardiac cath in December 2023 that showed severe native CAD involving the left coronary artery with patent grafts to the LAD and OM.  He was treated with aggressive medical optimization.  He was hospitalized a month later in January 2024 with recurrent chest pain and ruled out for ACS with 2 negative high-sensitivity troponins.  His chest pain was deemed to be atypical and not consistent with angina and there was suspicion that his myelodysplastic syndrome was playing a role in his chest pain.  Patient admits to sometimes missing medications but is mostly Compliant.  He denies cough, fever or chills, lower extremity pain or swelling. ED course and data review: Vitals within normal limits on arrival.  Troponin 12-12.  Mild pancytopenia with WBC 2.8, hemoglobin 9.9 and platelets 1 33,000.  Blood glucose 350, creatinine 1.39, up from baseline of 1.07, sodium 132. EKG, personally viewed and interpreted showing a flutter at 64 CTA chest was done that was negative for PE.  CT abdomen and pelvis showed no acute findings CT head  nonacute Observation requested for management of high risk chest pain   Review of Systems: As mentioned in the history of present illness. All other systems reviewed and are negative.  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 04/2022 Echo: EF 60-65%, no rwma, GrII DD, nl RV fxn, mildly dil LA, mild MR, nl fxn'ing biopros AoV.   Coronary artery disease    a. 01/2016 CABG x 2: LIMA->LAD, VG->OM; b. 05/2021 MV: small, mild, rev apical lateral and apical inf defects ->subtle ischemia vs artifact, EF 55-65%-->low risk.   CVA, old, disturbances of vision    Diabetes (HCC)    H/O aortic valve replacement    a. 01/2016 s/p bioprosthetic AVR @ [facility] for Severe AS; b. 05/2021 Echo: EF 55-60%, nl fxn of bioprosthetic AVR; c. 04/2022 Echo: EF 60-65%, nl fxn'ing AoV.   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 CHA2DS2VASc = 5-->eliquis/amio.   Retinal vascular occlusion of left eye    Several years ago   Sleep apnea    BiPAP   Vertigo    Wears dentures    full upper and lower.  Lower "broken"   Past Surgical History:  Procedure Laterality Date   AORTIC VALVE REPLACEMENT  2017   [facility], bioprosthetic   APPENDECTOMY  BONE MARROW BIOPSY     05/2022   CARDIAC VALVE REPLACEMENT     CATARACT EXTRACTION W/PHACO Right 11/12/2022   Procedure: CATARACT EXTRACTION PHACO AND INTRAOCULAR LENS PLACEMENT (IOC) RIGHT DIABETIC MALYUGIN  13.49  01:06.1;  Surgeon: Lockie Mola, MD;  Location: [facility];  Service: Ophthalmology;  Laterality: Right;  Diabetic   CORONARY ARTERY BYPASS GRAFT  2017   [facility] - LIMA-LAD and SVG-OM   CYSTOSCOPY W/ RETROGRADES Right 05/27/2019   Procedure: CYSTOSCOPY WITH RETROGRADE  PYELOGRAM;  Surgeon: Sondra Come, MD;  Location: [facility];  Service: Urology;  Laterality: Right;   CYSTOSCOPY W/ RETROGRADES Right 07/11/2019   Procedure: CYSTOSCOPY WITH RETROGRADE PYELOGRAM;  Surgeon: Sondra Come, MD;  Location: [facility];  Service: Urology;  Laterality: Right;   CYSTOSCOPY/URETEROSCOPY/HOLMIUM LASER/STENT PLACEMENT Right 05/27/2019   Procedure: CYSTOSCOPY/URETEROSCOPY/HOLMIUM LASER/STENT PLACEMENT;  Surgeon: Sondra Come, MD;  Location: [facility];  Service: Urology;  Laterality: Right;   CYSTOSCOPY/URETEROSCOPY/HOLMIUM LASER/STENT PLACEMENT Right 06/17/2019   Procedure: CYSTOSCOPY/URETEROSCOPY/HOLMIUM LASER/STENT Exchange;  Surgeon: Sondra Come, MD;  Location: [facility];  Service: Urology;  Laterality: Right;   CYSTOSCOPY/URETEROSCOPY/HOLMIUM LASER/STENT PLACEMENT Right 07/11/2019   Procedure: CYSTOSCOPY/URETEROSCOPY/HOLMIUM LASER/STENT Exchange;  Surgeon: Sondra Come, MD;  Location: [facility];  Service: Urology;  Laterality: Right;   RIGHT HEART CATH AND CORONARY/GRAFT ANGIOGRAPHY Bilateral 06/17/2022   Procedure: RIGHT HEART CATH AND CORONARY/GRAFT ANGIOGRAPHY;  Surgeon: Yvonne Kendall, MD;  Location: [facility];  Service: Cardiovascular;  Laterality: Bilateral;   STONE EXTRACTION WITH BASKET Right 07/11/2019   Procedure: STONE EXTRACTION WITH BASKET;  Surgeon: Sondra Come, MD;  Location: [facility];  Service: Urology;  Laterality: Right;   TEE WITHOUT CARDIOVERSION N/A 04/21/2019   Procedure: TRANSESOPHAGEAL ECHOCARDIOGRAM (TEE);  Surgeon: Antonieta Iba, MD;  Location: [facility];  Service: Cardiovascular;  Laterality: N/A;   TEE WITHOUT CARDIOVERSION N/A 07/04/2019   Procedure: TRANSESOPHAGEAL ECHOCARDIOGRAM (TEE);  Surgeon: Iran Ouch, MD;  Location: [facility];  Service: Cardiovascular;  Laterality: N/A;   TEE WITHOUT CARDIOVERSION N/A 08/17/2019   Procedure: TRANSESOPHAGEAL ECHOCARDIOGRAM (TEE);  Surgeon: Debbe Odea, MD;   Location: [facility];  Service: Cardiovascular;  Laterality: N/A;   TEE WITHOUT CARDIOVERSION N/A 11/15/2019   Procedure: TRANSESOPHAGEAL ECHOCARDIOGRAM (TEE);  Surgeon: Yvonne Kendall, MD;  Location: [facility];  Service: Cardiovascular;  Laterality: N/A;   TONSILLECTOMY     Social History:  reports that he quit smoking about 54 years ago. His smoking use included cigarettes. He has never used smokeless tobacco. He reports that he does not drink alcohol and does not use drugs.  Allergies  Allergen Reactions   Metformin Diarrhea    Loose stools even with XR    Family History  Problem Relation Age of Onset   Heart attack Father 45   Heart attack Sister     Prior to Admission medications   Medication Sig Start Date End Date Taking? Authorizing Provider  amiodarone (PACERONE) 200 MG tablet Take 1 tablet by mouth once daily 07/16/22   End, Cristal Deer, MD  apixaban (ELIQUIS) 5 MG TABS tablet Take 5 mg by mouth 2 (two) times daily.    [provider]  aspirin EC 81 MG tablet Take 81 mg by mouth daily. Swallow whole.    [provider]  atorvastatin (LIPITOR) 80 MG tablet Take 1 tablet (80 mg total) by mouth every evening. 11/01/21   Creig Hines, NP  furosemide (LASIX) 40 MG tablet Take 1 tablet (40 mg total)  by mouth 2 (two) times daily. 06/23/22 06/23/23  End, Cristal Deer, MD  insulin NPH-regular Human (70-30) 100 UNIT/ML injection Inject 130 Units into the skin 2 (two) times daily.    [provider]  isosorbide mononitrate (IMDUR) 30 MG 24 hr tablet Take 0.5 tablets (15 mg total) by mouth daily. Patient not taking: Reported on 11/06/2022 11/01/21   Creig Hines, NP  loratadine (CLARITIN) 10 MG tablet Take 10 mg by mouth daily. Patient not taking: Reported on 11/06/2022 09/13/18   [provider]  Magnesium Oxide 400 MG CAPS Take 1 capsule (400 mg total) by mouth daily. 11/01/21   Creig Hines, NP  meclizine (ANTIVERT) 25  MG tablet Take 1 tablet (25 mg total) by mouth 3 (three) times daily as needed for dizziness. 09/13/21   Burnadette Pop, MD  methocarbamol (ROBAXIN) 500 MG tablet Take 1 tablet (500 mg total) by mouth every 8 (eight) hours as needed for muscle spasms. 08/12/22   Tresa Moore, MD  metoprolol succinate (TOPROL-XL) 25 MG 24 hr tablet Take 25 mg by mouth 2 (two) times daily.    [provider]  nitroGLYCERIN (NITROSTAT) 0.4 MG SL tablet Place 1 tablet (0.4 mg total) under the tongue every 5 (five) minutes x 3 doses as needed for chest pain. 08/12/22   Tresa Moore, MD  omeprazole (PRILOSEC) 20 MG capsule Take 40 mg by mouth daily. 07/25/22   [provider]  ranolazine (RANEXA) 500 MG 12 hr tablet Take 1 tablet (500 mg total) by mouth 2 (two) times daily. 04/03/22   Furth, Cadence H, PA-C  tamsulosin (FLOMAX) 0.4 MG CAPS capsule Take 0.4 mg by mouth daily. 03/06/21   [provider]  vitamin B-12 (CYANOCOBALAMIN) 500 MCG tablet Take 1 tablet (500 mcg total) by mouth daily. 10/21/22   [name], MD    Physical Exam: Vitals:   12/08/22 1920 12/08/22 1930 12/08/22 2000 12/08/22 2030  BP: (!) 126/108 (!) 162/87 (!) 164/84 (!) 166/85  Pulse: 68 65 70 69  Resp: 16 16 19 20   Temp:   97.7 F (36.5 C)   TempSrc:   Oral   SpO2: 100% 100% 100% 100%  Weight:      Height:       Physical Exam Vitals and nursing note reviewed.  Constitutional:      General: He is not in acute distress. HENT:     Head: Normocephalic and atraumatic.  Cardiovascular:     Rate and Rhythm: Normal rate and regular rhythm.     Heart sounds: Normal heart sounds.  Pulmonary:     Effort: Pulmonary effort is normal.     Breath sounds: Normal breath sounds.  Abdominal:     Palpations: Abdomen is soft.     Tenderness: There is no abdominal tenderness.  Neurological:     Mental Status: Mental status is at baseline.     Labs on Admission: I have personally reviewed following labs and imaging  studies  CBC: Recent Labs  Lab 12/08/22 1440  WBC 2.8*  NEUTROABS 1.1*  HGB 9.9*  HCT 30.7*  MCV 114.6*  PLT 133*   Basic Metabolic Panel: Recent Labs  Lab 12/08/22 1440  NA 132*  K 4.0  CL 97*  CO2 24  GLUCOSE 350*  BUN 21  CREATININE 1.39*  CALCIUM 8.6*   GFR: Estimated Creatinine Clearance: 54.6 mL/min (A) (by C-G formula based on SCr of 1.39 mg/dL (H)). Liver Function Tests: Recent Labs  Lab  12/08/22 1440  AST 27  ALT 27  ALKPHOS 51  BILITOT 0.8  PROT 7.3  ALBUMIN 3.7   No results for input(s): "LIPASE", "AMYLASE" in the last 168 hours. No results for input(s): "AMMONIA" in the last 168 hours. Coagulation Profile: No results for input(s): "INR", "PROTIME" in the last 168 hours. Cardiac Enzymes: No results for input(s): "CKTOTAL", "CKMB", "CKMBINDEX", "TROPONINI" in the last 168 hours. BNP (last 3 results) No results for input(s): "PROBNP" in the last 8760 hours. HbA1C: No results for input(s): "HGBA1C" in the last 72 hours. CBG: Recent Labs  Lab 12/08/22 1947  GLUCAP 221*   Lipid Profile: No results for input(s): "CHOL", "HDL", "LDLCALC", "TRIG", "CHOLHDL", "LDLDIRECT" in the last 72 hours. Thyroid Function Tests: No results for input(s): "TSH", "T4TOTAL", "FREET4", "T3FREE", "THYROIDAB" in the last 72 hours. Anemia Panel: No results for input(s): "VITAMINB12", "FOLATE", "FERRITIN", "TIBC", "IRON", "RETICCTPCT" in the last 72 hours. Urine analysis:    Component Value Date/Time   COLORURINE YELLOW (A) 12/08/2022 1800   APPEARANCEUR CLEAR (A) 12/08/2022 1800   APPEARANCEUR Cloudy (A) 06/07/2019 1409   LABSPEC >1.046 (H) 12/08/2022 1800   PHURINE 6.0 12/08/2022 1800   GLUCOSEU >=500 (A) 12/08/2022 1800   HGBUR NEGATIVE 12/08/2022 1800   BILIRUBINUR NEGATIVE 12/08/2022 1800   BILIRUBINUR Negative 06/07/2019 1409   KETONESUR NEGATIVE 12/08/2022 1800   PROTEINUR NEGATIVE 12/08/2022 1800   NITRITE NEGATIVE 12/08/2022 1800   LEUKOCYTESUR  NEGATIVE 12/08/2022 1800    Radiological Exams on Admission: CT ABDOMEN PELVIS W CONTRAST  Result Date: 12/08/2022 CLINICAL DATA:  Abdominal pain. EXAM: CT ABDOMEN AND PELVIS WITH CONTRAST TECHNIQUE: Multidetector CT imaging of the abdomen and pelvis was performed using the standard protocol following bolus administration of intravenous contrast. RADIATION DOSE REDUCTION: This exam was performed according to the departmental dose-optimization program which includes automated exposure control, adjustment of the mA and/or kV according to patient size and/or use of iterative reconstruction technique. CONTRAST:  OMNIPAQUE IOHEXOL 350 MG/ML SOLN COMPARISON:  August 11, 2022 FINDINGS: Hepatobiliary: Several few mm too small to be actually characterized hypoattenuated lesions throughout the liver. Decompressed gallbladder. Pancreas: Unremarkable. No pancreatic ductal dilatation or surrounding inflammatory changes. Spleen: Normal in size without focal abnormality. Adrenals/Urinary Tract: Adrenal glands are unremarkable. Kidneys are normal, without renal calculi, focal lesion, or hydronephrosis. Left parapelvic cyst. Bladder is unremarkable. Stomach/Bowel: Stomach is within normal limits. No evidence of bowel wall thickening, distention, or inflammatory changes. Vascular/Lymphatic: Aortic atherosclerosis. No enlarged abdominal or pelvic lymph nodes. Reproductive: Mildly enlarged prostate gland. Other: Nonspecific fat stranding in the subcutaneous tissues in the right mid abdomen. Musculoskeletal: Spondylosis of the lumbosacral spine. IMPRESSION: 1. No acute findings within the abdomen or pelvis. 2. Nonspecific fat stranding in the subcutaneous tissues in the right mid abdomen. 3. Aortic atherosclerosis. 4. Several few mm too small to be actually characterized hypoattenuated lesions throughout the liver. Aortic Atherosclerosis (ICD10-I70.0). Electronically Signed   By: Ted Mcalpine M.D.   On: 12/08/2022  17:04   CT Angio Chest PE W and/or Wo Contrast  Result Date: 12/08/2022 CLINICAL DATA:  Pulmonary embolus suspected. EXAM: CT ANGIOGRAPHY CHEST WITH CONTRAST TECHNIQUE: Multidetector CT imaging of the chest was performed using the standard protocol during bolus administration of intravenous contrast. Multiplanar CT image reconstructions and MIPs were obtained to evaluate the vascular anatomy. RADIATION DOSE REDUCTION: This exam was performed according to the departmental dose-optimization program which includes automated exposure control, adjustment of the mA and/or kV according to patient size and/or  use of iterative reconstruction technique. CONTRAST:  OMNIPAQUE IOHEXOL 350 MG/ML SOLN COMPARISON:  January 10, 2023 FINDINGS: Cardiovascular: Satisfactory opacification of the pulmonary arteries to the segmental level. No evidence of pulmonary embolism. Normal heart size. No pericardial effusion. Calcific atherosclerotic disease of the coronary arteries and aorta. Postsurgical changes from CABG. Mediastinum/Nodes: No enlarged mediastinal, hilar, or axillary lymph nodes. Thyroid gland, trachea, and esophagus demonstrate no significant findings. Lungs/Pleura: Lungs are clear. No pleural effusion or pneumothorax. Musculoskeletal: No chest wall abnormality. No acute or significant osseous findings. Review of the MIP images confirms the above findings. IMPRESSION: 1. No evidence of pulmonary embolus. 2. Calcific atherosclerotic disease of the coronary arteries and aorta. 3. Postsurgical changes from CABG. 4. Aortic atherosclerosis. Aortic Atherosclerosis (ICD10-I70.0). Electronically Signed   By: Ted Mcalpine M.D.   On: 12/08/2022 16:58   CT HEAD WO CONTRAST ( )  Result Date: 12/08/2022 CLINICAL DATA:  Headache, new onset (Age >= 51y) EXAM: CT HEAD WITHOUT CONTRAST TECHNIQUE: Contiguous axial images were obtained from the base of the skull through the vertex without intravenous contrast. RADIATION DOSE  REDUCTION: This exam was performed according to the departmental dose-optimization program which includes automated exposure control, adjustment of the mA and/or kV according to patient size and/or use of iterative reconstruction technique. COMPARISON:  11/01/2021 FINDINGS: Brain: No evidence of acute infarction, hemorrhage, hydrocephalus, extra-axial collection or mass lesion/mass effect. Stable degree of atrophy and chronic small vessel ischemia. Remote left parietal infarct. Vascular: Atherosclerosis of skullbase vasculature without hyperdense vessel or abnormal calcification. Skull: No fracture or focal lesion. Sinuses/Orbits: Mild chronic mucosal thickening of paranasal sinuses. Prior right cataract resection. Other: None. IMPRESSION: 1. No acute intracranial abnormality. 2. Stable atrophy and chronic small vessel ischemia. Remote left parietal infarct. Electronically Signed   By: Narda Rutherford M.D.   On: 12/08/2022 16:53     Data Reviewed: Relevant notes from primary care and specialist visits, past discharge summaries as available in EHR, including Care Everywhere. Prior diagnostic testing as pertinent to current admission diagnoses Updated medications and problem lists for reconciliation ED course, including vitals, labs, imaging, treatment and response to treatment Triage notes, nursing and pharmacy notes and ED provider's notes Notable results as noted in HPI   Assessment and Plan: * Chest pain, undetermined etiology History of CAD s/p CABG with patent grafts 06/2022, history of MDS, history of endocarditis Pain was determined to be noncardiac during her hospitalization in January 2024 Given negative troponins and nonacute EKG will hold off on heparin Will get sed rate Echocardiogram in the a.m. Cardiology consult   Coronary artery disease of native artery of native heart with stable angina pectoris (HCC) History of CABG Possible stable , less likely unstable angina Chest pain  with negative troponins and nonacute EKG Continue Ranexa, metoprolol, Imdur Sublingual chest pain as needed chest pain Continue apixaban Follow-up echocardiogram  Paroxysmal atrial fibrillation/A-flutter (HCC) A flutter On EKG but Rate Controlled Continue Amiodarone Continue Eliquis  AKI (acute kidney injury) (HCC) Hyponatremia Creatinine 1.31, up from baseline of 1.07, likely prerenal Will hydrate with NS  Uncontrolled type 2 diabetes mellitus with hyperglycemia, with long-term current use of insulin (HCC) Blood glucose 350 Sliding scale insulin coverage  Myelodysplastic syndrome (HCC) No acute issues suspected Hemoglobin slightly down from baseline with hemoglobin 9.9 down from 11.3, otherwise stable pancytopenia    Latest Ref Rng & Units 12/08/2022    2:40 PM 10/17/2022   10:45 AM 09/09/2022   10:37 AM  CBC  WBC 4.0 -  10.5 K/uL 2.8  3.3  4.1   Hemoglobin 13.0 - 17.0 g/dL 9.9  16.1  09.6   Hematocrit 39.0 - 52.0 % 30.7  34.2  37.4   Platelets 150 - 400 K/uL 133  134  126      Chronic heart failure with preserved ejection fraction (HFpEF) (HCC) Compensated Continue metoprolol, furosemide  S/P aortic valve replacement History of endocarditis post aortic valve replacement No acute issues suspected Given persistent chest pain will get sed rate and blood cultures(recommended past hospitalization by cardiology)  Essential hypertension Continue metoprolol    DVT prophylaxis: eliquis  Consults: [facility] cardiology  Advance Care Planning:   Code Status: Prior   Family Communication: none  Disposition Plan: Back to previous home environment  Severity of Illness: The appropriate patient status for this patient is OBSERVATION. Observation status is judged to be reasonable and necessary in order to provide the required intensity of service to ensure the patient's safety. The patient's presenting symptoms, physical exam findings, and initial radiographic and laboratory data  in the context of their medical condition is felt to place them at decreased risk for further clinical deterioration. Furthermore, it is anticipated that the patient will be medically stable for discharge from the hospital within 2 midnights of admission.   Author: Andris Baumann, MD 12/08/2022 9:25 PM  For on call review [email or web address].

## 2022-12-08 NOTE — Assessment & Plan Note (Signed)
History of endocarditis post aortic valve replacement No acute issues suspected Given persistent chest pain will get sed rate and blood cultures(recommended past hospitalization by cardiology)

## 2022-12-08 NOTE — Assessment & Plan Note (Signed)
Blood glucose 350 Sliding scale insulin coverage

## 2022-12-08 NOTE — Assessment & Plan Note (Addendum)
History of CABG Possible stable , less likely unstable angina Chest pain with negative troponins and nonacute EKG Continue Ranexa, metoprolol, Imdur Sublingual chest pain as needed chest pain Continue apixaban Follow-up echocardiogram

## 2022-12-08 NOTE — ED Notes (Signed)
Called lab about repeat trop result being potentially run off original tube by accident considering result is exact same number and charted as received at exact same time as initial trop. [place] in lab looking into this now to run new trop off new tube if needed.

## 2022-12-08 NOTE — Discharge Instructions (Addendum)
Use your oxygen as per instruction keep log of your sugars at home. Discussed with your [facility] PCP. Continue your insulin dosage as per [facility] PCP recommendation. Follow-up [facility] hematology.

## 2022-12-08 NOTE — ED Notes (Signed)
Blue, lt grn and lav tubes sent to lab.  

## 2022-12-08 NOTE — ED Notes (Signed)
If there are any issues with the pt call Jonathan Cross first and then Jonathan Cross.

## 2022-12-08 NOTE — Assessment & Plan Note (Signed)
No acute disused suspected 

## 2022-12-08 NOTE — ED Notes (Signed)
Prompted pt to use urinal if possible; pt is trying to provide urine sample now.

## 2022-12-08 NOTE — ED Notes (Signed)
Will collect repeat trop once pt back to room. Pt currently away at imaging.

## 2022-12-08 NOTE — Assessment & Plan Note (Signed)
Continue metoprolol. 

## 2022-12-08 NOTE — ED Notes (Signed)
Pt placed on 3L O2 via  at family's request as wears CPAP at home and is trying to go to sleep now. Urinal attached to pt's stretcher rail and pt reminded to provide urine sample when possible. Given warm blanket.

## 2022-12-08 NOTE — Assessment & Plan Note (Signed)
Compensated Continue metoprolol, furosemide

## 2022-12-08 NOTE — Assessment & Plan Note (Signed)
Hyponatremia Creatinine 1.31, up from baseline of 1.07, likely prerenal Will hydrate with NS

## 2022-12-09 ENCOUNTER — Other Ambulatory Visit: Payer: Self-pay

## 2022-12-09 ENCOUNTER — Observation Stay (HOSPITAL_BASED_OUTPATIENT_CLINIC_OR_DEPARTMENT_OTHER)
Admit: 2022-12-09 | Discharge: 2022-12-09 | Disposition: A | Discharge status: Home / Self Care | Payer: Medicare HMO | Attending: Internal Medicine | Admitting: Internal Medicine

## 2022-12-09 DIAGNOSIS — R079 Chest pain, unspecified: Secondary | ICD-10-CM

## 2022-12-09 DIAGNOSIS — I2089 Other forms of angina pectoris: Secondary | ICD-10-CM | POA: Diagnosis not present

## 2022-12-09 DIAGNOSIS — I25110 Atherosclerotic heart disease of native coronary artery with unstable angina pectoris: Secondary | ICD-10-CM

## 2022-12-09 DIAGNOSIS — I5033 Acute on chronic diastolic (congestive) heart failure: Secondary | ICD-10-CM

## 2022-12-09 LAB — CBC
HCT: 32.7 % — ABNORMAL LOW (ref 39.0–52.0)
Hemoglobin: 10.4 g/dL — ABNORMAL LOW (ref 13.0–17.0)
MCH: 36.5 pg — ABNORMAL HIGH (ref 26.0–34.0)
MCHC: 31.8 g/dL (ref 30.0–36.0)
MCV: 114.7 fL — ABNORMAL HIGH (ref 80.0–100.0)
Platelets: 140 K/uL — ABNORMAL LOW (ref 150–400)
RBC: 2.85 MIL/uL — ABNORMAL LOW (ref 4.22–5.81)
RDW: 15.3 % (ref 11.5–15.5)
WBC: 2.8 K/uL — ABNORMAL LOW (ref 4.0–10.5)
nRBC: 0 % (ref 0.0–0.2)

## 2022-12-09 LAB — CULTURE, BLOOD (ROUTINE X 2): Special Requests: ADEQUATE

## 2022-12-09 LAB — ECHOCARDIOGRAM COMPLETE
AR max vel: 1.71 cm2
AV Area VTI: 1.7 cm2
AV Area mean vel: 1.82 cm2
AV Mean grad: 5 mmHg
AV Peak grad: 8.8 mmHg
Ao pk vel: 1.48 m/s
Area-P 1/2: 3 cm2
Height: 69 in
MV VTI: 1.67 cm2
S' Lateral: 2.8 cm
Weight: 4162.28 oz

## 2022-12-09 LAB — CBG MONITORING, ED
Glucose-Capillary: 160 mg/dL — ABNORMAL HIGH (ref 70–99)
Glucose-Capillary: 207 mg/dL — ABNORMAL HIGH (ref 70–99)
Glucose-Capillary: 213 mg/dL — ABNORMAL HIGH (ref 70–99)
Glucose-Capillary: 277 mg/dL — ABNORMAL HIGH (ref 70–99)

## 2022-12-09 LAB — BASIC METABOLIC PANEL WITH GFR
Anion gap: 7 (ref 5–15)
BUN: 17 mg/dL (ref 8–23)
CO2: 26 mmol/L (ref 22–32)
Calcium: 8.5 mg/dL — ABNORMAL LOW (ref 8.9–10.3)
Chloride: 101 mmol/L (ref 98–111)
Creatinine, Ser: 1.07 mg/dL (ref 0.61–1.24)
GFR, Estimated: >60 mL/min (ref >60)
Glucose, Bld: 239 mg/dL — ABNORMAL HIGH (ref 70–99)
Potassium: 3.8 mmol/L (ref 3.5–5.1)
Sodium: 134 mmol/L — ABNORMAL LOW (ref 135–145)

## 2022-12-09 LAB — MAGNESIUM: Magnesium: 2.1 mg/dL (ref 1.7–2.4)

## 2022-12-09 LAB — GLUCOSE, CAPILLARY: Glucose-Capillary: 183 mg/dL — ABNORMAL HIGH (ref 70–99)

## 2022-12-09 LAB — SEDIMENTATION RATE: Sed Rate: 63 mm/hr — ABNORMAL HIGH (ref 0–20)

## 2022-12-09 LAB — HEMOGLOBIN A1C
Hgb A1c MFr Bld: 8.1 % — ABNORMAL HIGH (ref 4.8–5.6)
Mean Plasma Glucose: 186 mg/dL

## 2022-12-09 MED ORDER — PANTOPRAZOLE SODIUM 40 MG PO TBEC
40.0000 mg | DELAYED_RELEASE_TABLET | Freq: Every day | ORAL | Status: DC
  Administered 2022-12-09 – 2022-12-14 (×6): 40 mg via ORAL
  Filled 2022-12-09 (×6): qty 1

## 2022-12-09 MED ORDER — METOPROLOL SUCCINATE ER 25 MG PO TB24
25.0000 mg | ORAL_TABLET | Freq: Every day | ORAL | Status: DC
  Administered 2022-12-09 – 2022-12-14 (×6): 25 mg via ORAL
  Filled 2022-12-09 (×6): qty 1

## 2022-12-09 MED ORDER — RANOLAZINE ER 500 MG PO TB12
500.0000 mg | ORAL_TABLET | Freq: Two times a day (BID) | ORAL | Status: DC
  Administered 2022-12-09 – 2022-12-14 (×11): 500 mg via ORAL
  Filled 2022-12-09 (×11): qty 1

## 2022-12-09 MED ORDER — ASPIRIN 81 MG PO TBEC
81.0000 mg | DELAYED_RELEASE_TABLET | Freq: Every day | ORAL | Status: DC
  Administered 2022-12-09 – 2022-12-14 (×6): 81 mg via ORAL
  Filled 2022-12-09 (×6): qty 1

## 2022-12-09 MED ORDER — AMIODARONE HCL 200 MG PO TABS
200.0000 mg | ORAL_TABLET | Freq: Every day | ORAL | Status: DC
  Administered 2022-12-09 – 2022-12-14 (×6): 200 mg via ORAL
  Filled 2022-12-09 (×6): qty 1

## 2022-12-09 MED ORDER — FUROSEMIDE 10 MG/ML IJ SOLN
20.0000 mg | Freq: Once | INTRAMUSCULAR | Status: AC
  Administered 2022-12-09: 20 mg via INTRAVENOUS
  Filled 2022-12-09: qty 4

## 2022-12-09 MED ORDER — VITAMIN B-12 1000 MCG PO TABS
500.0000 ug | ORAL_TABLET | Freq: Every day | ORAL | Status: DC
  Administered 2022-12-10 – 2022-12-14 (×5): 500 ug via ORAL
  Filled 2022-12-09 (×5): qty 1

## 2022-12-09 MED ORDER — ATORVASTATIN CALCIUM 80 MG PO TABS
80.0000 mg | ORAL_TABLET | Freq: Every evening | ORAL | Status: DC
  Administered 2022-12-09 – 2022-12-13 (×5): 80 mg via ORAL
  Filled 2022-12-09: qty 4
  Filled 2022-12-09 (×4): qty 1

## 2022-12-09 MED ORDER — APIXABAN 5 MG PO TABS
5.0000 mg | ORAL_TABLET | Freq: Two times a day (BID) | ORAL | Status: DC
  Administered 2022-12-09 – 2022-12-10 (×3): 5 mg via ORAL
  Filled 2022-12-09 (×3): qty 1

## 2022-12-09 MED ORDER — TAMSULOSIN HCL 0.4 MG PO CAPS
0.4000 mg | ORAL_CAPSULE | Freq: Every day | ORAL | Status: DC
  Administered 2022-12-09 – 2022-12-14 (×6): 0.4 mg via ORAL
  Filled 2022-12-09 (×6): qty 1

## 2022-12-09 NOTE — Progress Notes (Signed)
*  PRELIMINARY RESULTS* Echocardiogram 2D Echocardiogram has been performed.  Cristela Blue 12/09/2022, 8:35 AM

## 2022-12-09 NOTE — Progress Notes (Signed)
PROGRESS NOTE    Jonathan Cross.  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  Upon admission CT head, CT chest abdomen pelvis does not show any acute pathology.   Assessment & Plan:  Principal Problem:   Chest pain, undetermined etiology Active Problems:   Coronary artery disease of native artery of native heart with stable angina pectoris (HCC)   Status post coronary artery bypass graft   Paroxysmal atrial fibrillation/A-flutter (HCC)   AKI (acute kidney injury) (HCC)   Uncontrolled type 2 diabetes mellitus with hyperglycemia, with long-term current use of insulin (HCC)   Essential hypertension   S/P aortic valve replacement   Chronic heart failure with preserved ejection fraction (HFpEF) (HCC)   Myelodysplastic syndrome (HCC)      * Chest pain, undetermined etiology History of CAD s/p CABG with patent grafts 06/2022, history of MDS, history of endocarditis Troponins are flat, EKG is nonischemic.  Echocardiogram ordered.  Cardiology consulted.  Coronary artery disease of native artery of native heart with stable angina pectoris (HCC) History of CABG Possible stable , less likely unstable angina Resume home [place].  Unsure if patient is on Imdur and beta-blocker at home.  Will need to clarify.  Paroxysmal atrial fibrillation/A-flutter (HCC) A flutter On EKG but Rate Controlled -Continue amiodarone and Eliquis  AKI (acute kidney injury) (HCC) Hyponatremia Creatinine 1.31, up from baseline of 1.07, likely prerenal LABS pending.   Uncontrolled type 2 diabetes mellitus with hyperglycemia, with long-term current use of insulin  (HCC) Blood glucose 350 Sliding scale insulin coverage  Myelodysplastic syndrome (HCC) No acute issues suspected Stable, follows outpatient.    Chronic heart failure with preserved ejection fraction (HFpEF) (HCC) Compensated Continue metoprolol, furosemide  S/P aortic valve replacement History of endocarditis post aortic valve replacement No acute issues suspected   Essential hypertension Continue metoprolol    DVT prophylaxis:  apixaban (ELIQUIS) tablet 5 mg  Code Status: Full Family Communication:    Ongoing management by cardiology for chest pain     Diet Orders (From admission, onward)     Start     Ordered   12/08/22 2154  Diet heart healthy/carb modified Room service appropriate? Yes; Fluid consistency: Thin  Diet effective now       Question Answer Comment  Diet-HS Snack? Nothing   Room service appropriate? Yes   Fluid consistency: Thin      12/08/22 2154            Subjective: Seen and at side, tells me his chest pain is better this morning.  He thinks it may be coming back but no other complaints.   Examination:  General exam: Appears calm and comfortable  Respiratory system: Clear to auscultation. Respiratory effort normal. Cardiovascular system: S1 & S2 heard, RRR. No JVD, murmurs, rubs, gallops or clicks. No pedal edema. Gastrointestinal system: Abdomen is nondistended, soft and nontender. No organomegaly or masses felt. Normal bowel sounds heard. Central nervous system: Alert and oriented. No focal neurological deficits. Extremities: Symmetric 5 x 5 power. Skin: No rashes, lesions or ulcers Psychiatry: Judgement and insight appear normal. Mood & affect appropriate.  Objective: Vitals:   12/08/22 2030 12/09/22 0010 12/09/22 0658 12/09/22 0730  BP: Marland Kitchen)  166/85 (!) 165/95 (!) 151/70 (!) 150/68  Pulse: 69 73 78 74  Resp: 20 20 12 18   Temp:  98 F (36.7 C) 98.1 F (36.7 C) 98.2 F (36.8 C)  TempSrc:  Oral Oral Oral  SpO2: 100% 100% 99%  99%  Weight:      Height:        Intake/Output Summary (Last 24 hours) at 12/09/2022 0927 Last data filed at 12/09/2022 0805 Gross per 24 hour  Intake 793.33 ml  Output --  Net 793.33 ml   Filed Weights   12/08/22 1439  Weight: 118 kg    Scheduled Meds:  insulin aspart  0-20 Units Subcutaneous TID WC   insulin aspart  0-5 Units Subcutaneous QHS   Continuous Infusions:  Nutritional status     Body mass index is 38.42 kg/m.  Data Reviewed:   CBC: Recent Labs  Lab 12/08/22 1440  WBC 2.8*  NEUTROABS 1.1*  HGB 9.9*  HCT 30.7*  MCV 114.6*  PLT 133*   Basic Metabolic Panel: Recent Labs  Lab 12/08/22 1440  NA 132*  K 4.0  CL 97*  CO2 24  GLUCOSE 350*  BUN 21  CREATININE 1.39*  CALCIUM 8.6*   GFR: Estimated Creatinine Clearance: 54.6 mL/min (A) (by C-G formula based on SCr of 1.39 mg/dL (H)). Liver Function Tests: Recent Labs  Lab 12/08/22 1440  AST 27  ALT 27  ALKPHOS 51  BILITOT 0.8  PROT 7.3  ALBUMIN 3.7   No results for input(s): "LIPASE", "AMYLASE" in the last 168 hours. No results for input(s): "AMMONIA" in the last 168 hours. Coagulation Profile: No results for input(s): "INR", "PROTIME" in the last 168 hours. Cardiac Enzymes: No results for input(s): "CKTOTAL", "CKMB", "CKMBINDEX", "TROPONINI" in the last 168 hours. BNP (last 3 results) No results for input(s): "PROBNP" in the last 8760 hours. HbA1C: No results for input(s): "HGBA1C" in the last 72 hours. CBG: Recent Labs  Lab 12/08/22 1947 12/09/22 0003 12/09/22 0728  GLUCAP 221* 160* 207*   Lipid Profile: No results for input(s): "CHOL", "HDL", "LDLCALC", "TRIG", "CHOLHDL", "LDLDIRECT" in the last 72 hours. Thyroid Function Tests: No results for input(s): "TSH", "T4TOTAL", "FREET4", "T3FREE", "THYROIDAB" in the last 72 hours. Anemia Panel: No results for input(s): "VITAMINB12", "FOLATE", "FERRITIN", "TIBC", "IRON", "RETICCTPCT" in the last 72 hours. Sepsis Labs: No results  for input(s): "PROCALCITON", "LATICACIDVEN" in the last 168 hours.  Recent Results (from the past 240 hour(s))  Culture, blood (Routine X 2) w Reflex to ID Panel     Status: None (Preliminary result)   Collection Time: 12/08/22 11:38 PM   Specimen: BLOOD  Result Value Ref Range Status   Specimen Description BLOOD BLOOD RIGHT ARM  Final   Special Requests   Final    BOTTLES DRAWN AEROBIC AND ANAEROBIC Blood Culture adequate volume   Culture   Final    NO GROWTH < 12 HOURS Performed at [facility], [address] [place], [place] [address]    Report Status PENDING  Incomplete  Culture, blood (Routine X 2) w Reflex to ID Panel     Status: None (Preliminary result)   Collection Time: 12/08/22 11:38 PM   Specimen: BLOOD  Result Value Ref Range Status   Specimen Description BLOOD BLOOD LEFT ARM  Final   Special Requests   Final    BOTTLES DRAWN AEROBIC AND ANAEROBIC Blood Culture adequate volume   Culture   Final    NO GROWTH < 12 HOURS Performed  at [facility], [address] [place], [place] [address]    Report Status PENDING  Incomplete         Radiology Studies: CT ABDOMEN PELVIS W CONTRAST  Result Date: 12/08/2022 CLINICAL DATA:  Abdominal pain. EXAM: CT ABDOMEN AND PELVIS WITH CONTRAST TECHNIQUE: Multidetector CT imaging of the abdomen and pelvis was performed using the standard protocol following bolus administration of intravenous contrast. RADIATION DOSE REDUCTION: This exam was performed according to the departmental dose-optimization program which includes automated exposure control, adjustment of the mA and/or kV according to patient size and/or use of iterative reconstruction technique. CONTRAST:  OMNIPAQUE IOHEXOL 350 MG/ML SOLN COMPARISON:  August 11, 2022 FINDINGS: Hepatobiliary: Several few mm too small to be actually characterized hypoattenuated lesions throughout the liver. Decompressed gallbladder. Pancreas: Unremarkable. No pancreatic  ductal dilatation or surrounding inflammatory changes. Spleen: Normal in size without focal abnormality. Adrenals/Urinary Tract: Adrenal glands are unremarkable. Kidneys are normal, without renal calculi, focal lesion, or hydronephrosis. Left parapelvic cyst. Bladder is unremarkable. Stomach/Bowel: Stomach is within normal limits. No evidence of bowel wall thickening, distention, or inflammatory changes. Vascular/Lymphatic: Aortic atherosclerosis. No enlarged abdominal or pelvic lymph nodes. Reproductive: Mildly enlarged prostate gland. Other: Nonspecific fat stranding in the subcutaneous tissues in the right mid abdomen. Musculoskeletal: Spondylosis of the lumbosacral spine. IMPRESSION: 1. No acute findings within the abdomen or pelvis. 2. Nonspecific fat stranding in the subcutaneous tissues in the right mid abdomen. 3. Aortic atherosclerosis. 4. Several few mm too small to be actually characterized hypoattenuated lesions throughout the liver. Aortic Atherosclerosis (ICD10-I70.0). Electronically Signed   By: Ted Mcalpine M.D.   On: 12/08/2022 17:04   CT Angio Chest PE W and/or Wo Contrast  Result Date: 12/08/2022 CLINICAL DATA:  Pulmonary embolus suspected. EXAM: CT ANGIOGRAPHY CHEST WITH CONTRAST TECHNIQUE: Multidetector CT imaging of the chest was performed using the standard protocol during bolus administration of intravenous contrast. Multiplanar CT image reconstructions and MIPs were obtained to evaluate the vascular anatomy. RADIATION DOSE REDUCTION: This exam was performed according to the departmental dose-optimization program which includes automated exposure control, adjustment of the mA and/or kV according to patient size and/or use of iterative reconstruction technique. CONTRAST:  OMNIPAQUE IOHEXOL 350 MG/ML SOLN COMPARISON:  January 10, 2023 FINDINGS: Cardiovascular: Satisfactory opacification of the pulmonary arteries to the segmental level. No evidence of pulmonary embolism. Normal  heart size. No pericardial effusion. Calcific atherosclerotic disease of the coronary arteries and aorta. Postsurgical changes from CABG. Mediastinum/Nodes: No enlarged mediastinal, hilar, or axillary lymph nodes. Thyroid gland, trachea, and esophagus demonstrate no significant findings. Lungs/Pleura: Lungs are clear. No pleural effusion or pneumothorax. Musculoskeletal: No chest wall abnormality. No acute or significant osseous findings. Review of the MIP images confirms the above findings. IMPRESSION: 1. No evidence of pulmonary embolus. 2. Calcific atherosclerotic disease of the coronary arteries and aorta. 3. Postsurgical changes from CABG. 4. Aortic atherosclerosis. Aortic Atherosclerosis (ICD10-I70.0). Electronically Signed   By: Ted Mcalpine M.D.   On: 12/08/2022 16:58   CT HEAD WO CONTRAST ( )  Result Date: 12/08/2022 CLINICAL DATA:  Headache, new onset (Age >= 51y) EXAM: CT HEAD WITHOUT CONTRAST TECHNIQUE: Contiguous axial images were obtained from the base of the skull through the vertex without intravenous contrast. RADIATION DOSE REDUCTION: This exam was performed according to the departmental dose-optimization program which includes automated exposure control, adjustment of the mA and/or kV according to patient size and/or use of iterative reconstruction technique. COMPARISON:  11/01/2021 FINDINGS: Brain: No evidence of  acute infarction, hemorrhage, hydrocephalus, extra-axial collection or mass lesion/mass effect. Stable degree of atrophy and chronic small vessel ischemia. Remote left parietal infarct. Vascular: Atherosclerosis of skullbase vasculature without hyperdense vessel or abnormal calcification. Skull: No fracture or focal lesion. Sinuses/Orbits: Mild chronic mucosal thickening of paranasal sinuses. Prior right cataract resection. Other: None. IMPRESSION: 1. No acute intracranial abnormality. 2. Stable atrophy and chronic small vessel ischemia. Remote left parietal infarct.  Electronically Signed   By: Narda Rutherford M.D.   On: 12/08/2022 16:53           LOS: 0 days   Time spent= 35 mins    Dejanira Pamintuan Joline Maxcy, MD [facility]  If 7PM-7AM, please contact night-coverage  12/09/2022, 9:27 AM

## 2022-12-09 NOTE — Consult Note (Signed)
Cardiology Consultation   Patient ID: Damarrius Kulas [facility]. MRN: [id number]; DOB: 1946/05/10  Admit date: 12/08/2022 Date of Consult: 12/09/2022  PCP:  Marva Panda, MD   [facility] Providers Cardiologist:  Yvonne Kendall, MD   {   Patient Profile:   Brentton Chrisco. is a 77 y.o. male with a hx of CAD status post two-vessel CABG (LIMA to LAD, SVG to OM), severe aortic stenosis status post bioprosthetic AVR complicated by endocarditis, PAF on Eliquis, HFpEF, MDS, diabetes type 2, HTN, HLD, obesity, OSA who is being seen 12/09/2022 for the evaluation of chest pain at the request of Dr. Nelson Chimes.  History of Present Illness:   Mr. Lueken previously followed by [facility] for severe aortic stenosis.  He underwent two-vessel CABG and bioprosthetic AVR in July 2017.  Between 2020 and 2021, he had multiple admissions related to fever, malaise, and Enterococcus bacteremia.  TEE in October 2020 and December 2020 were negative for vegetation.  In February 2021, he was found to have a mobile mass on his prosthetic aortic valve and was seen by ID with treatment with appropriate antibiotics for 6 weeks in the outpatient setting.  Prior to PICC removal, he had repeat TEE in May 2021, which continue to show an echodensity that appears less mobile but larger.  Perivalvular abscess was unable to be excluded.  He was transferred to [facility] with cardiac CT showing evidence of a small mass in the leaflet of the noncoronary cusp, likely representing a healed vegetation.  Leaflets were freely mobile without evidence of obstruction.  Was a small pseudoaneurysm measuring 5.7 mm x 4.6 mm of the noncoronary cusp consistent with aortic root abscess.  Both grafts were visualized and patent.  CT surgery evaluated the patient and recommended continue observation and antibiotic therapy.  In November 2022, he was admitted with dyspnea and chest fullness, found to be in A-fib with RVR with conversion to sinus  rhythm with IV amiodarone.  He underwent stress testing in the setting of chest pain which showed a small mildly reversible apical lateral and anterior inferior defect with question of subtle ischemia versus infarct.  Ultimately, this was felt to be low risk study and he was medically managed.  He had subsequently had multiple ED visits for chest pain.  He was hospitalized in October 2023 with mildly elevated high-sensitivity troponin that was flat and felt to be related to anemia with a hemoglobin of 8 requiring 1 unit PRBC.  Echo showed preserved LVSF with normal bioprosthetic aortic valve function.  Eliquis was held with plans for patient to follow-up with hematology.  He presented to [facility] ED in late October 2023 with recurrent chest pain with normal troponins and hemoglobin improved up to 10.  Cardiac cath was discussed, though ultimately deferred pending workup of anemia.  He was ultimately diagnosed with mild dysplastic syndrome.  In follow-up, due to continued marked fatigue and intermittent chest discomfort, he underwent right and left heart cath December 2023 which revealed significant multivessel CAD including 80% ostial LAD followed by sequential 50 to 60% mid LAD lesions, 50% proximal left circumflex disease, and 70 to 80% stenosis of the proximal segment of RPL 1.  Widely patent LIMA to LAD and SVG to OM 2.  Moderately elevated left heart filling pressures, mildly elevated right heart filling pressure and pulmonary artery pressure with normal cardiac output/index.  There was no indication for intervention with continued medical management recommended.  Continue to have ongoing fatigue and  shortness of breath with intermittent atypical chest pain.  Was admitted to [facility] in January 2024 for right scapular pain and sharp chest discomfort.  High-sensitivity troponin was 20, 19.  Hemoglobin was 11.1.  Patient was in sinus rhythm.  She was seen by cardiology with no plans for ischemic evaluation.  Patient  presented to [facility] ED 12/08/2022 with chest pain and shortness of breath. He reports he has chronic fatigue and chest discomfort. 2 days ago after church he reported he felt tired and fatigued. He had dyspnea on exertion and chest discomfort across his chest. He laid down to rest. He said his heart rate was high, but after taking a SL NTG, his heart rate improved. He is unsure if he has been compliant with cardiac medications.   In the ER high-sensitivity troponin was 12, 12.  Vitals were normal.  Hemoglobin 9.9 and platelets 33 K.  Creatinine 1.39, which is up from baseline.  Sodium 132. EKG showed NSR with no ischemic changes.  CT of the chest was negative for PE.  CT abdomen and pelvis nonacute.  CT of the head nonacute.  Initial EKG showed normal sinus rhythm.  Patient was admitted for observation.   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 04/2022 Echo: EF 60-65%, no rwma, GrII DD, nl RV fxn, mildly dil LA, mild MR, nl fxn'ing biopros AoV.   Coronary artery disease    a. 01/2016 CABG x 2: LIMA->LAD, VG->OM; b. 05/2021 MV: small, mild, rev apical lateral and apical inf defects ->subtle ischemia vs artifact, EF 55-65%-->low risk.   CVA, old, disturbances of vision    Diabetes (HCC)    H/O aortic valve replacement    a. 01/2016 s/p bioprosthetic AVR @ [facility]; b. 05/2021 Echo: EF 55-60%, nl fxn of bioprosthetic AVR; c. 04/2022 Echo: EF 60-65%, nl fxn'ing AoV.   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 CHA2DS2VASc = 5-->eliquis/amio.   Retinal vascular occlusion of left eye    Several years ago   Sleep apnea    BiPAP   Vertigo    Wears dentures    full upper and lower.  Lower "broken"    Past  Surgical History:  Procedure Laterality Date   AORTIC VALVE REPLACEMENT  2017   [facility], bioprosthetic   APPENDECTOMY     BONE MARROW BIOPSY     05/2022   CARDIAC VALVE REPLACEMENT     CATARACT EXTRACTION W/PHACO Right 11/12/2022   Procedure: CATARACT EXTRACTION PHACO AND INTRAOCULAR LENS PLACEMENT (IOC) RIGHT DIABETIC MALYUGIN  13.49  01:06.1;  Surgeon: Lockie Mola, MD;  Location: [facility];  Service: Ophthalmology;  Laterality: Right;  Diabetic   CORONARY ARTERY BYPASS GRAFT  2017   [facility] - LIMA-LAD and SVG-OM   CYSTOSCOPY W/ RETROGRADES Right 05/27/2019   Procedure: CYSTOSCOPY WITH RETROGRADE PYELOGRAM;  Surgeon: Sondra Come, MD;  Location: [facility] ORS;  Service: Urology;  Laterality: Right;   CYSTOSCOPY W/ RETROGRADES Right 07/11/2019   Procedure: CYSTOSCOPY WITH RETROGRADE PYELOGRAM;  Surgeon: Sondra Come, MD;  Location: [facility] OR[facility];  Service: Urology;  Laterality: Right;   CYSTOSCOPY/URETEROSCOPY/HOLMIUM LASER/STENT PLACEMENT Right 05/27/2019   Procedure: CYSTOSCOPY/URETEROSCOPY/HOLMIUM  LASER/STENT PLACEMENT;  Surgeon: Sondra Come, MD;  Location: [facility] OR[facility];  Service: Urology;  Laterality: Right;   CYSTOSCOPY/URETEROSCOPY/HOLMIUM LASER/STENT PLACEMENT Right 06/17/2019   Procedure: CYSTOSCOPY/URETEROSCOPY/HOLMIUM LASER/STENT Exchange;  Surgeon: Sondra Come, MD;  Location: [facility];  Service: Urology;  Laterality: Right;   CYSTOSCOPY/URETEROSCOPY/HOLMIUM LASER/STENT PLACEMENT Right 07/11/2019   Procedure: CYSTOSCOPY/URETEROSCOPY/HOLMIUM LASER/STENT Exchange;  Surgeon: Sondra Come, MD;  Location: [facility] OR[facility];  Service: Urology;  Laterality: Right;   RIGHT HEART CATH AND CORONARY/GRAFT ANGIOGRAPHY Bilateral 06/17/2022   Procedure: RIGHT HEART CATH AND CORONARY/GRAFT ANGIOGRAPHY;  Surgeon: Yvonne Kendall, MD;  Location: [facility];  Service: Cardiovascular;  Laterality: Bilateral;   STONE EXTRACTION WITH BASKET Right 07/11/2019   Procedure: STONE  EXTRACTION WITH BASKET;  Surgeon: Sondra Come, MD;  Location: [facility];  Service: Urology;  Laterality: Right;   TEE WITHOUT CARDIOVERSION N/A 04/21/2019   Procedure: TRANSESOPHAGEAL ECHOCARDIOGRAM (TEE);  Surgeon: Antonieta Iba, MD;  Location: [facility];  Service: Cardiovascular;  Laterality: N/A;   TEE WITHOUT CARDIOVERSION N/A 07/04/2019   Procedure: TRANSESOPHAGEAL ECHOCARDIOGRAM (TEE);  Surgeon: Iran Ouch, MD;  Location: [facility];  Service: Cardiovascular;  Laterality: N/A;   TEE WITHOUT CARDIOVERSION N/A 08/17/2019   Procedure: TRANSESOPHAGEAL ECHOCARDIOGRAM (TEE);  Surgeon: Debbe Odea, MD;  Location: [facility];  Service: Cardiovascular;  Laterality: N/A;   TEE WITHOUT CARDIOVERSION N/A 11/15/2019   Procedure: TRANSESOPHAGEAL ECHOCARDIOGRAM (TEE);  Surgeon: Yvonne Kendall, MD;  Location: [facility] OR[facility];  Service: Cardiovascular;  Laterality: N/A;   TONSILLECTOMY       Home Medications:  Prior to Admission medications   Medication Sig Start Date End Date Taking? Authorizing Provider  amiodarone (PACERONE) 200 MG tablet Take 1 tablet by mouth once daily Patient taking differently: Take 200 mg by mouth daily. 07/16/22  Yes End, Cristal Deer, MD  apixaban (ELIQUIS) 5 MG TABS tablet Take 5 mg by mouth 2 (two) times daily.   Yes [provider]  aspirin EC 81 MG tablet Take 81 mg by mouth daily. Swallow whole.   Yes [provider]  atorvastatin (LIPITOR) 80 MG tablet Take 1 tablet (80 mg total) by mouth every evening. 11/01/21  Yes Creig Hines, NP  furosemide (LASIX) 40 MG tablet Take 1 tablet (40 mg total) by mouth 2 (two) times daily. 06/23/22 06/23/23 Yes End, Cristal Deer, MD  insulin NPH-regular Human (70-30) 100 UNIT/ML injection Inject 130 Units into the skin 2 (two) times daily.   Yes [provider]  Magnesium Oxide 400 MG CAPS Take 1 capsule (400 mg total) by mouth daily. 11/01/21  Yes Creig Hines, NP  nitroGLYCERIN  (NITROSTAT) 0.4 MG SL tablet Place 1 tablet (0.4 mg total) under the tongue every 5 (five) minutes x 3 doses as needed for chest pain. 08/12/22  Yes Sreenath, Sudheer B, MD  omeprazole (PRILOSEC) 20 MG capsule Take 40 mg by mouth daily. 07/25/22  Yes [provider]  ranolazine (RANEXA) 500 MG 12 hr tablet Take 1 tablet (500 mg total) by mouth 2 (two) times daily. 04/03/22  Yes Ikram Riebe H, PA-C  tamsulosin (FLOMAX) 0.4 MG CAPS capsule Take 0.4 mg by mouth daily. 03/06/21  Yes [provider]  vitamin B-12 (CYANOCOBALAMIN) 500 MCG tablet Take 1 tablet (500 mcg total) by mouth daily. 10/21/22  Yes [name], MD  isosorbide mononitrate (IMDUR) 30 MG 24 hr tablet Take 0.5 tablets (15 mg total) by mouth daily. Patient not taking: Reported on 11/06/2022 11/01/21   Creig Hines,  NP  loratadine (CLARITIN) 10 MG tablet Take 10 mg by mouth daily. Patient not taking: Reported on 11/06/2022 09/13/18   [provider]  meclizine (ANTIVERT) 25 MG tablet Take 1 tablet (25 mg total) by mouth 3 (three) times daily as needed for dizziness. Patient not taking: Reported on 12/08/2022 09/13/21   Burnadette Pop, MD  methocarbamol (ROBAXIN) 500 MG tablet Take 1 tablet (500 mg total) by mouth every 8 (eight) hours as needed for muscle spasms. Patient not taking: Reported on 12/08/2022 08/12/22   Tresa Moore, MD  metoprolol succinate (TOPROL-XL) 25 MG 24 hr tablet Take 25 mg by mouth 2 (two) times daily. Patient not taking: Reported on 12/09/2022    [provider]    Inpatient Medications: Scheduled Meds:  insulin aspart  0-20 Units Subcutaneous TID WC   insulin aspart  0-5 Units Subcutaneous QHS   Continuous Infusions:  sodium chloride 100 mL/hr at 12/09/22 0009   PRN Meds: acetaminophen, nitroGLYCERIN, ondansetron (ZOFRAN) IV  Allergies:    Allergies  Allergen Reactions   Metformin Diarrhea    Loose stools even with XR    Social History:   Social History    Socioeconomic History   Marital status: Married    Spouse name: Not on file   Number of children: Not on file   Years of education: Not on file   Highest education level: Not on file  Occupational History   Not on file  Tobacco Use   Smoking status: Former    Types: Cigarettes    Quit date: 1970    Years since quitting: 54.4   Smokeless tobacco: Never  Vaping Use   Vaping Use: Never used  Substance and Sexual Activity   Alcohol use: Never   Drug use: Never   Sexual activity: Not Currently  Other Topics Concern   Not on file  Social History Narrative   Not on file   Social Determinants of Health   Financial Resource Strain: Not on file  Food Insecurity: Not on file  Transportation Needs: Not on file  Physical Activity: Not on file  Stress: Not on file  Social Connections: Not on file  Intimate Partner Violence: Not on file    Family History:    Family History  Problem Relation Age of Onset   Heart attack Father 25   Heart attack Sister      ROS:  Please see the history of present illness.   All other ROS reviewed and negative.     Physical Exam/Data:   Vitals:   12/08/22 2000 12/08/22 2030 12/09/22 0010 12/09/22 0658  BP: (!) 164/84 (!) 166/85 (!) 165/95 (!) 151/70  Pulse: 70 69 73 78  Resp: 19 20 20 12   Temp: 97.7 F (36.5 C)  98 F (36.7 C) 98.1 F (36.7 C)  TempSrc: Oral  Oral Oral  SpO2: 100% 100% 100% 99%  Weight:      Height:       No intake or output data in the 24 hours ending 12/09/22 0730    12/08/2022    2:39 PM 11/12/2022    6:39 AM 11/06/2022   12:55 PM  Last 3 Weights  Weight (lbs) 260 lb 2.3 oz 261 lb 261 lb  Weight (kg) 118 kg 118.389 kg 118.389 kg     Body mass index is 38.42 kg/m.  General:  Well nourished, well developed, in no acute distress HEENT: normal Neck: no JVD Vascular: No carotid bruits; Distal pulses 2+  bilaterally Cardiac:  normal S1, S2; RRR; no murmur  Lungs:  clear to auscultation bilaterally, no  wheezing, rhonchi or rales  Abd: soft, nontender, no hepatomegaly  Ext: no edema Musculoskeletal:  No deformities, BUE and BLE strength normal and equal Skin: warm and dry  Neuro:  CNs 2-12 intact, no focal abnormalities noted Psych:  Normal affect   EKG:  The EKG was personally reviewed and demonstrates: NSR, 72bpm, 1st d AVB, Lad, no St/t wave changes Telemetry:  Telemetry was personally reviewed and demonstrates:  NSR Hr 70s  Relevant CV Studies:  [facility] 06/17/2022: Conclusions: Significant multivessel coronary artery disease, as detailed below, including 80% ostial LAD disease followed by sequential 50-60% mid LAD lesions, 50% proximal LCx disease, and 70-80% lesion in proximal segment of rPL1. Widely patent LIMA-LAD and SVG-OM2. Moderately elevated left heart filling pressures. Mildly elevated right heart and pulmonary artery pressures. Normal cardiac output/index.   Recommendations: Escalate diuresis with repeat BMP at follow-up visit next week. Secondary prevention of coronary artery disease and continued antianginal therapy. 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 __________   2D echo 05/02/2022: 1. Left ventricular ejection fraction, by estimation, is 60 to 65%. The  left ventricle has normal function. The left ventricle has no regional  wall motion abnormalities. Left ventricular diastolic parameters are  consistent with Grade II diastolic  dysfunction (pseudonormalization).   2. Right ventricular systolic function is normal. The right ventricular  size is normal. Tricuspid regurgitation signal is inadequate for assessing  PA pressure.   3. Left atrial size was mildly dilated.   4. The mitral valve is normal in structure. Mild mitral valve  regurgitation. No evidence of mitral stenosis.   5. The aortic valve has been repaired/replaced. Aortic valve  regurgitation is  not visualized. No aortic stenosis is present. Aortic  valve area, by VTI measures 2.85 cm. Aortic valve mean gradient measures  15.0 mmHg. s/p AVR bovine in 2017   6. The inferior vena cava is normal in size with greater than 50%  respiratory variability, suggesting right atrial pressure of 3 mmHg.     Laboratory Data:  High Sensitivity Troponin:   Recent Labs  Lab 12/08/22 1440 12/08/22 1811  TROPONINIHS 12  12 12      Chemistry Recent Labs  Lab 12/08/22 1440  NA 132*  K 4.0  CL 97*  CO2 24  GLUCOSE 350*  BUN 21  CREATININE 1.39*  CALCIUM 8.6*  GFRNONAA 52*  ANIONGAP 11    Recent Labs  Lab 12/08/22 1440  PROT 7.3  ALBUMIN 3.7  AST 27  ALT 27  ALKPHOS 51  BILITOT 0.8   Lipids No results for input(s): "CHOL", "TRIG", "HDL", "LABVLDL", "LDLCALC", "CHOLHDL" in the last 168 hours.  Hematology Recent Labs  Lab 12/08/22 1440  WBC 2.8*  RBC 2.68*  HGB 9.9*  HCT 30.7*  MCV 114.6*  MCH 36.9*  MCHC 32.2  RDW 15.3  PLT 133*   Thyroid No results for input(s): "TSH", "FREET4" in the last 168 hours.  BNP Recent Labs  Lab 12/08/22 1540  BNP 39.2    DDimer No results for input(s): "DDIMER" in the last 168 hours.   Radiology/Studies:  CT ABDOMEN PELVIS W CONTRAST  Result Date: 12/08/2022 CLINICAL DATA:  Abdominal pain. EXAM: CT ABDOMEN AND PELVIS WITH CONTRAST TECHNIQUE: Multidetector CT imaging of the abdomen and pelvis was performed using the standard protocol following bolus administration  of intravenous contrast. RADIATION DOSE REDUCTION: This exam was performed according to the departmental dose-optimization program which includes automated exposure control, adjustment of the mA and/or kV according to patient size and/or use of iterative reconstruction technique. CONTRAST:  OMNIPAQUE IOHEXOL 350 MG/ML SOLN COMPARISON:  August 11, 2022 FINDINGS: Hepatobiliary: Several few mm too small to be actually characterized hypoattenuated lesions throughout the  liver. Decompressed gallbladder. Pancreas: Unremarkable. No pancreatic ductal dilatation or surrounding inflammatory changes. Spleen: Normal in size without focal abnormality. Adrenals/Urinary Tract: Adrenal glands are unremarkable. Kidneys are normal, without renal calculi, focal lesion, or hydronephrosis. Left parapelvic cyst. Bladder is unremarkable. Stomach/Bowel: Stomach is within normal limits. No evidence of bowel wall thickening, distention, or inflammatory changes. Vascular/Lymphatic: Aortic atherosclerosis. No enlarged abdominal or pelvic lymph nodes. Reproductive: Mildly enlarged prostate gland. Other: Nonspecific fat stranding in the subcutaneous tissues in the right mid abdomen. Musculoskeletal: Spondylosis of the lumbosacral spine. IMPRESSION: 1. No acute findings within the abdomen or pelvis. 2. Nonspecific fat stranding in the subcutaneous tissues in the right mid abdomen. 3. Aortic atherosclerosis. 4. Several few mm too small to be actually characterized hypoattenuated lesions throughout the liver. Aortic Atherosclerosis (ICD10-I70.0). Electronically Signed   By: Ted Mcalpine M.D.   On: 12/08/2022 17:04   CT Angio Chest PE W and/or Wo Contrast  Result Date: 12/08/2022 CLINICAL DATA:  Pulmonary embolus suspected. EXAM: CT ANGIOGRAPHY CHEST WITH CONTRAST TECHNIQUE: Multidetector CT imaging of the chest was performed using the standard protocol during bolus administration of intravenous contrast. Multiplanar CT image reconstructions and MIPs were obtained to evaluate the vascular anatomy. RADIATION DOSE REDUCTION: This exam was performed according to the departmental dose-optimization program which includes automated exposure control, adjustment of the mA and/or kV according to patient size and/or use of iterative reconstruction technique. CONTRAST:  OMNIPAQUE IOHEXOL 350 MG/ML SOLN COMPARISON:  January 10, 2023 FINDINGS: Cardiovascular: Satisfactory opacification of the pulmonary  arteries to the segmental level. No evidence of pulmonary embolism. Normal heart size. No pericardial effusion. Calcific atherosclerotic disease of the coronary arteries and aorta. Postsurgical changes from CABG. Mediastinum/Nodes: No enlarged mediastinal, hilar, or axillary lymph nodes. Thyroid gland, trachea, and esophagus demonstrate no significant findings. Lungs/Pleura: Lungs are clear. No pleural effusion or pneumothorax. Musculoskeletal: No chest wall abnormality. No acute or significant osseous findings. Review of the MIP images confirms the above findings. IMPRESSION: 1. No evidence of pulmonary embolus. 2. Calcific atherosclerotic disease of the coronary arteries and aorta. 3. Postsurgical changes from CABG. 4. Aortic atherosclerosis. Aortic Atherosclerosis (ICD10-I70.0). Electronically Signed   By: Ted Mcalpine M.D.   On: 12/08/2022 16:58   CT HEAD WO CONTRAST ( )  Result Date: 12/08/2022 CLINICAL DATA:  Headache, new onset (Age >= 51y) EXAM: CT HEAD WITHOUT CONTRAST TECHNIQUE: Contiguous axial images were obtained from the base of the skull through the vertex without intravenous contrast. RADIATION DOSE REDUCTION: This exam was performed according to the departmental dose-optimization program which includes automated exposure control, adjustment of the mA and/or kV according to patient size and/or use of iterative reconstruction technique. COMPARISON:  11/01/2021 FINDINGS: Brain: No evidence of acute infarction, hemorrhage, hydrocephalus, extra-axial collection or mass lesion/mass effect. Stable degree of atrophy and chronic small vessel ischemia. Remote left parietal infarct. Vascular: Atherosclerosis of skullbase vasculature without hyperdense vessel or abnormal calcification. Skull: No fracture or focal lesion. Sinuses/Orbits: Mild chronic mucosal thickening of paranasal sinuses. Prior right cataract resection. Other: None. IMPRESSION: 1. No acute intracranial abnormality. 2. Stable  atrophy and chronic small vessel  ischemia. Remote left parietal infarct. Electronically Signed   By: Narda Rutherford M.D.   On: 12/08/2022 16:53     Assessment and Plan:   Chest pain CAD status post CABG in 2017 - chest pain is fairly atypical. Seems to have chronic generalized fatigue, SOB and chest pain in the setting of CAD with underlying MDS - HS trop negative x 2 - EKG nonischemic - echo performed - [facility] 06/2022 showed patent grafts with no indication for intervention, with residual disease - given more atypical chest pain in the setting of MDS and negative troponin, do not suspect we will pursue further ischemic work-up. - continue ASA, Lipitor, Toprol, Imdur, Toprol and Ranexa - may be able to increase Imdur or Ranexa. ?May be able to stop ASA given Eliquis and MDS  Status post AVR with h/o endocarditis - echo had been performed - most recent echo showed normal prosthetic valve function  PAF - h/o Afib - appears to be in NSR - continue Eliquis 5mg  BID and amiodarone 200mg  daily - CHADSVASC at least 6 - continue toprol  HFpEF - patient appears euvolemic on exam - PTA lasix 20mg  BID  MDS - Hgb 9.9 - Hgb was 11-12 one month ago - per hem/onc  Hyperlipidemia - continue PTA Lipitor  Hypertension - Bps mildly elevated  For questions or updates, please contact [facility] Please consult [email or web address] for contact info under    Signed, Addysin Porco David Stall, PA-C  12/09/2022 7:30 AM

## 2022-12-10 DIAGNOSIS — I200 Unstable angina: Secondary | ICD-10-CM

## 2022-12-10 DIAGNOSIS — I25110 Atherosclerotic heart disease of native coronary artery with unstable angina pectoris: Secondary | ICD-10-CM | POA: Diagnosis not present

## 2022-12-10 DIAGNOSIS — I5032 Chronic diastolic (congestive) heart failure: Secondary | ICD-10-CM | POA: Diagnosis not present

## 2022-12-10 LAB — CBC
HCT: 33.8 % — ABNORMAL LOW (ref 39.0–52.0)
Hemoglobin: 11.1 g/dL — ABNORMAL LOW (ref 13.0–17.0)
MCH: 37.2 pg — ABNORMAL HIGH (ref 26.0–34.0)
MCHC: 32.8 g/dL (ref 30.0–36.0)
MCV: 113.4 fL — ABNORMAL HIGH (ref 80.0–100.0)
Platelets: 144 10*3/uL — ABNORMAL LOW (ref 150–400)
RBC: 2.98 MIL/uL — ABNORMAL LOW (ref 4.22–5.81)
RDW: 15.3 % (ref 11.5–15.5)
WBC: 3.4 10*3/uL — ABNORMAL LOW (ref 4.0–10.5)
nRBC: 0 % (ref 0.0–0.2)

## 2022-12-10 LAB — BASIC METABOLIC PANEL
Anion gap: 9 (ref 5–15)
BUN: 20 mg/dL (ref 8–23)
CO2: 24 mmol/L (ref 22–32)
Calcium: 8.7 mg/dL — ABNORMAL LOW (ref 8.9–10.3)
Chloride: 102 mmol/L (ref 98–111)
Creatinine, Ser: 1.19 mg/dL (ref 0.61–1.24)
GFR, Estimated: >60 mL/min (ref >60)
Glucose, Bld: 210 mg/dL — ABNORMAL HIGH (ref 70–99)
Potassium: 4 mmol/L (ref 3.5–5.1)
Sodium: 135 mmol/L (ref 135–145)

## 2022-12-10 LAB — MAGNESIUM: Magnesium: 2.2 mg/dL (ref 1.7–2.4)

## 2022-12-10 LAB — GLUCOSE, CAPILLARY
Glucose-Capillary: 208 mg/dL — ABNORMAL HIGH (ref 70–99)
Glucose-Capillary: 269 mg/dL — ABNORMAL HIGH (ref 70–99)
Glucose-Capillary: 228 mg/dL — ABNORMAL HIGH (ref 70–99)
Glucose-Capillary: 319 mg/dL — ABNORMAL HIGH (ref 70–99)

## 2022-12-10 LAB — CULTURE, BLOOD (ROUTINE X 2)

## 2022-12-10 MED ORDER — ISOSORBIDE MONONITRATE ER 30 MG PO TB24
15.0000 mg | ORAL_TABLET | Freq: Every day | ORAL | Status: DC
  Administered 2022-12-10 – 2022-12-11 (×2): 15 mg via ORAL
  Filled 2022-12-10 (×2): qty 1

## 2022-12-10 MED ORDER — INSULIN GLARGINE-YFGN 100 UNIT/ML ~~LOC~~ SOLN
10.0000 [IU] | Freq: Every day | SUBCUTANEOUS | Status: DC
  Administered 2022-12-10 – 2022-12-12 (×3): 10 [IU] via SUBCUTANEOUS
  Filled 2022-12-10 (×3): qty 0.1

## 2022-12-10 MED ORDER — FUROSEMIDE 10 MG/ML IJ SOLN
20.0000 mg | Freq: Once | INTRAMUSCULAR | Status: AC
  Administered 2022-12-10: 20 mg via INTRAVENOUS
  Filled 2022-12-10: qty 2

## 2022-12-10 MED ORDER — INSULIN ASPART 100 UNIT/ML IJ SOLN
3.0000 [IU] | Freq: Three times a day (TID) | INTRAMUSCULAR | Status: DC
  Administered 2022-12-10 – 2022-12-11 (×3): 3 [IU] via SUBCUTANEOUS
  Filled 2022-12-10 (×4): qty 1

## 2022-12-10 NOTE — TOC CM/SW Note (Signed)
Transition of Care [facility]) - Inpatient Brief Assessment   Patient Details  Name: Jonathan Cross. MRN: [id number] Date of Birth: 07/05/1946  [facility] CM/SW Contact:    Truddie Hidden, RN Phone Number: 12/10/2022, 9:55 AM   Clinical Narrative: TOC will provide ongoing  assessment for needs and discharge planning.      Transition of Care Asessment: Insurance and Status: Insurance coverage has been reviewed Patient has primary care physician: Yes Home environment has been reviewed: Return to home Prior level of function:: Single family home Prior/Current Home Services: No current home services Social Determinants of Health Reivew: SDOH reviewed no interventions necessary Readmission risk has been reviewed: Yes Transition of care needs: no transition of care needs at this time

## 2022-12-10 NOTE — Progress Notes (Signed)
Rounding Note    Patient Name: Jonathan Cross. Date of Encounter: 12/10/2022  [facility] Cardiologist: Yvonne Kendall, MD   Subjective   Patient reports he is still feeling poorly. Reports shortness of breath and 6/10 chest pain. Labs appear stable.   Inpatient Medications    Scheduled Meds:  amiodarone  200 mg Oral Daily   apixaban  5 mg Oral BID   aspirin EC  81 mg Oral Daily   atorvastatin  80 mg Oral QPM   cyanocobalamin  500 mcg Oral Daily   insulin aspart  0-20 Units Subcutaneous TID WC   insulin aspart  0-5 Units Subcutaneous QHS   metoprolol succinate  25 mg Oral Daily   pantoprazole  40 mg Oral Daily   ranolazine  500 mg Oral BID   tamsulosin  0.4 mg Oral Daily   Continuous Infusions:  PRN Meds: acetaminophen, nitroGLYCERIN, ondansetron (ZOFRAN) IV   Vital Signs    Vitals:   12/10/22 0429 12/10/22 0429 12/10/22 0534 12/10/22 0811  BP: 132/67 132/67  (!) 143/74  Pulse: 69 70  64  Resp: 19 19  18   Temp: 97.7 F (36.5 C) 97.7 F (36.5 C)  97.6 F (36.4 C)  TempSrc:      SpO2: 96% 95%  99%  Weight:   116.7 kg   Height:        Intake/Output Summary (Last 24 hours) at 12/10/2022 0939 Last data filed at 12/10/2022 0500 Gross per 24 hour  Intake 360 ml  Output 550 ml  Net -190 ml      12/10/2022    5:34 AM 12/09/2022    7:40 PM 12/08/2022    2:39 PM  Last 3 Weights  Weight (lbs) 257 lb 4.4 oz 258 lb 6.1 oz 260 lb 2.3 oz  Weight (kg) 116.7 kg 117.2 kg 118 kg      Telemetry    NSR HR 60s - Personally Reviewed  ECG    pending - Personally Reviewed  Physical Exam   GEN: No acute distress.   Neck: No JVD Cardiac: RRR, no murmurs, rubs, or gallops.  Respiratory: Clear to auscultation bilaterally. GI: Soft, nontender, non-distended  MS: No edema; No deformity. Neuro:  Nonfocal  Psych: Normal affect   Labs    High Sensitivity Troponin:   Recent Labs  Lab 12/08/22 1440 12/08/22 1811  TROPONINIHS 12  12 12       Chemistry Recent Labs  Lab 12/08/22 1440 12/09/22 1223 12/10/22 0347  NA 132* 134* 135  K 4.0 3.8 4.0  CL 97* 101 102  CO2 24 26 24   GLUCOSE 350* 239* 210*  BUN 21 17 20   CREATININE 1.39* 1.07 1.19  CALCIUM 8.6* 8.5* 8.7*  MG  --  2.1 2.2  PROT 7.3  --   --   ALBUMIN 3.7  --   --   AST 27  --   --   ALT 27  --   --   ALKPHOS 51  --   --   BILITOT 0.8  --   --   GFRNONAA 52* >60 >60  ANIONGAP 11 7 9     Lipids No results for input(s): "CHOL", "TRIG", "HDL", "LABVLDL", "LDLCALC", "CHOLHDL" in the last 168 hours.  Hematology Recent Labs  Lab 12/08/22 1440 12/09/22 0951 12/10/22 0347  WBC 2.8* 2.8* 3.4*  RBC 2.68* 2.85* 2.98*  HGB 9.9* 10.4* 11.1*  HCT 30.7* 32.7* 33.8*  MCV 114.6* 114.7* 113.4*  MCH 36.9*  36.5* 37.2*  MCHC 32.2 31.8 32.8  RDW 15.3 15.3 15.3  PLT 133* 140* 144*   Thyroid No results for input(s): "TSH", "FREET4" in the last 168 hours.  BNP Recent Labs  Lab 12/08/22 1540  BNP 39.2    DDimer No results for input(s): "DDIMER" in the last 168 hours.   Radiology    ECHOCARDIOGRAM COMPLETE  Result Date: 12/09/2022    ECHOCARDIOGRAM REPORT   Patient Name:   Jonathan Cross [facility]. Date of Exam: 12/09/2022 Medical Rec #:  [id number]              Height:       69.0 in Accession #:    [id number]             Weight:       260.1 lb Date of Birth:  01-15-1946              BSA:          2.310 m Patient Age:    77 years               BP:           151/70 mmHg Patient Gender: M                      HR:           78 bpm. Exam Location:  [facility] Procedure: 2D Echo, Cardiac Doppler and Color Doppler Indications:     Chest pain R07.9  History:         Patient has prior history of Echocardiogram examinations, most                  recent 05/02/2022. Risk Factors:Hypertension. Aortic valve                  replacement.                  Aortic Valve: 23 mm bovine valve is present in the aortic                  position.  Sonographer:     Cristela Blue Referring Phys:  [id number] Andris Baumann Diagnosing Phys: Yvonne Kendall MD  Sonographer Comments: Suboptimal apical window. IMPRESSIONS  1. Left ventricular ejection fraction, by estimation, is 60 to 65%. The left ventricle has normal function. Left ventricular endocardial border not optimally defined to evaluate regional wall motion. There is mild left ventricular hypertrophy. Left ventricular diastolic parameters are consistent with Grade I diastolic dysfunction (impaired relaxation).  2. Right ventricular systolic function is mildly reduced. The right ventricular size is normal. Tricuspid regurgitation signal is inadequate for assessing PA pressure.  3. The mitral valve is abnormal. Mild mitral valve regurgitation. No evidence of mitral stenosis.  4. The aortic valve was not well visualized. Aortic valve regurgitation is not visualized. No aortic stenosis is present. There is a 23 mm bovine valve present in the aortic position. FINDINGS  Left Ventricle: Left ventricular ejection fraction, by estimation, is 60 to 65%. The left ventricle has normal function. Left ventricular endocardial border not optimally defined to evaluate regional wall motion. The left ventricular internal cavity size was normal in size. There is mild left ventricular hypertrophy. Left ventricular diastolic parameters are consistent with Grade I diastolic dysfunction (impaired relaxation). Right Ventricle: The right ventricular size is normal. No increase in right ventricular wall thickness. Right ventricular systolic function is mildly reduced. Tricuspid  regurgitation signal is inadequate for assessing PA pressure. Left Atrium: Left atrial size was normal in size. Right Atrium: Right atrial size was normal in size. Pericardium: The pericardium was not well visualized. Mitral Valve: The mitral valve is abnormal. There is mild thickening of the mitral valve leaflet(s). There is mild calcification of the mitral valve leaflet(s). Mild mitral valve regurgitation. No evidence  of mitral valve stenosis. MV peak gradient, 6.5 mmHg. The mean mitral valve gradient is 3.0 mmHg. Tricuspid Valve: The tricuspid valve is not well visualized. Tricuspid valve regurgitation is trivial. Aortic Valve: The aortic valve was not well visualized. Aortic valve regurgitation is not visualized. No aortic stenosis is present. Aortic valve mean gradient measures 5.0 mmHg. Aortic valve peak gradient measures 8.8 mmHg. Aortic valve area, by VTI measures 1.70 cm. There is a 23 mm bovine valve present in the aortic position. Pulmonic Valve: The pulmonic valve was not well visualized. Pulmonic valve regurgitation is not visualized. No evidence of pulmonic stenosis. Aorta: The aortic root is normal in size and structure. Pulmonary Artery: The pulmonary artery is not well seen. Venous: The inferior vena cava was not well visualized. IAS/Shunts: The interatrial septum was not well visualized.  LEFT VENTRICLE PLAX 2D LVIDd:         3.90 cm   Diastology LVIDs:         2.80 cm   LV e' medial:    4.13 cm/s LV PW:         1.20 cm   LV E/e' medial:  21.1 LV IVS:        1.25 cm   LV e' lateral:   11.60 cm/s LVOT diam:     2.00 cm   LV E/e' lateral: 7.5 LV SV:         50 LV SV Index:   22 LVOT Area:     3.14 cm  RIGHT VENTRICLE RV Basal diam:  3.00 cm RV Mid diam:    2.80 cm RV S prime:     7.83 cm/s TAPSE (M-mode): 1.7 cm LEFT ATRIUM             Index        RIGHT ATRIUM           Index LA diam:        3.70 cm 1.60 cm/m   RA Area:     11.84 cm LA Vol (A2C):   30.2 ml 13.07 ml/m  RA Volume:   24.94 ml  10.79 ml/m LA Vol (A4C):   31.2 ml 13.51 ml/m LA Biplane Vol: 32.3 ml 13.98 ml/m  AORTIC VALVE AV Area (Vmax):    1.71 cm AV Area (Vmean):   1.82 cm AV Area (VTI):     1.70 cm AV Vmax:           148.00 cm/s AV Vmean:          100.925 cm/s AV VTI:            0.296 m AV Peak Grad:      8.8 mmHg AV Mean Grad:      5.0 mmHg LVOT Vmax:         80.60 cm/s LVOT Vmean:        58.400 cm/s LVOT VTI:          0.160 m LVOT/AV VTI  ratio: 0.54  AORTA Ao Root diam: 3.00 cm MITRAL VALVE MV Area (PHT): 3.00 cm     SHUNTS MV Area VTI:  1.67 cm     Systemic VTI:  0.16 m MV Peak grad:  6.5 mmHg     Systemic Diam: 2.00 cm MV Mean grad:  3.0 mmHg MV Vmax:       1.27 m/s MV Vmean:      75.7 cm/s MV Decel Time: 253 msec MV E velocity: 87.00 cm/s MV A velocity: 119.00 cm/s MV E/A ratio:  0.73 Yvonne Kendall MD Electronically signed by Yvonne Kendall MD Signature Date/Time: 12/09/2022/12:47:54 PM    Final    CT ABDOMEN PELVIS W CONTRAST  Result Date: 12/08/2022 CLINICAL DATA:  Abdominal pain. EXAM: CT ABDOMEN AND PELVIS WITH CONTRAST TECHNIQUE: Multidetector CT imaging of the abdomen and pelvis was performed using the standard protocol following bolus administration of intravenous contrast. RADIATION DOSE REDUCTION: This exam was performed according to the departmental dose-optimization program which includes automated exposure control, adjustment of the mA and/or kV according to patient size and/or use of iterative reconstruction technique. CONTRAST:  OMNIPAQUE IOHEXOL 350 MG/ML SOLN COMPARISON:  August 11, 2022 FINDINGS: Hepatobiliary: Several few mm too small to be actually characterized hypoattenuated lesions throughout the liver. Decompressed gallbladder. Pancreas: Unremarkable. No pancreatic ductal dilatation or surrounding inflammatory changes. Spleen: Normal in size without focal abnormality. Adrenals/Urinary Tract: Adrenal glands are unremarkable. Kidneys are normal, without renal calculi, focal lesion, or hydronephrosis. Left parapelvic cyst. Bladder is unremarkable. Stomach/Bowel: Stomach is within normal limits. No evidence of bowel wall thickening, distention, or inflammatory changes. Vascular/Lymphatic: Aortic atherosclerosis. No enlarged abdominal or pelvic lymph nodes. Reproductive: Mildly enlarged prostate gland. Other: Nonspecific fat stranding in the subcutaneous tissues in the right mid abdomen. Musculoskeletal:  Spondylosis of the lumbosacral spine. IMPRESSION: 1. No acute findings within the abdomen or pelvis. 2. Nonspecific fat stranding in the subcutaneous tissues in the right mid abdomen. 3. Aortic atherosclerosis. 4. Several few mm too small to be actually characterized hypoattenuated lesions throughout the liver. Aortic Atherosclerosis (ICD10-I70.0). Electronically Signed   By: Ted Mcalpine M.D.   On: 12/08/2022 17:04   CT Angio Chest PE W and/or Wo Contrast  Result Date: 12/08/2022 CLINICAL DATA:  Pulmonary embolus suspected. EXAM: CT ANGIOGRAPHY CHEST WITH CONTRAST TECHNIQUE: Multidetector CT imaging of the chest was performed using the standard protocol during bolus administration of intravenous contrast. Multiplanar CT image reconstructions and MIPs were obtained to evaluate the vascular anatomy. RADIATION DOSE REDUCTION: This exam was performed according to the departmental dose-optimization program which includes automated exposure control, adjustment of the mA and/or kV according to patient size and/or use of iterative reconstruction technique. CONTRAST:  OMNIPAQUE IOHEXOL 350 MG/ML SOLN COMPARISON:  January 10, 2023 FINDINGS: Cardiovascular: Satisfactory opacification of the pulmonary arteries to the segmental level. No evidence of pulmonary embolism. Normal heart size. No pericardial effusion. Calcific atherosclerotic disease of the coronary arteries and aorta. Postsurgical changes from CABG. Mediastinum/Nodes: No enlarged mediastinal, hilar, or axillary lymph nodes. Thyroid gland, trachea, and esophagus demonstrate no significant findings. Lungs/Pleura: Lungs are clear. No pleural effusion or pneumothorax. Musculoskeletal: No chest wall abnormality. No acute or significant osseous findings. Review of the MIP images confirms the above findings. IMPRESSION: 1. No evidence of pulmonary embolus. 2. Calcific atherosclerotic disease of the coronary arteries and aorta. 3. Postsurgical changes from  CABG. 4. Aortic atherosclerosis. Aortic Atherosclerosis (ICD10-I70.0). Electronically Signed   By: Ted Mcalpine M.D.   On: 12/08/2022 16:58   CT HEAD WO CONTRAST ( )  Result Date: 12/08/2022 CLINICAL DATA:  Headache, new onset (Age >= 51y) EXAM: CT HEAD WITHOUT  CONTRAST TECHNIQUE: Contiguous axial images were obtained from the base of the skull through the vertex without intravenous contrast. RADIATION DOSE REDUCTION: This exam was performed according to the departmental dose-optimization program which includes automated exposure control, adjustment of the mA and/or kV according to patient size and/or use of iterative reconstruction technique. COMPARISON:  11/01/2021 FINDINGS: Brain: No evidence of acute infarction, hemorrhage, hydrocephalus, extra-axial collection or mass lesion/mass effect. Stable degree of atrophy and chronic small vessel ischemia. Remote left parietal infarct. Vascular: Atherosclerosis of skullbase vasculature without hyperdense vessel or abnormal calcification. Skull: No fracture or focal lesion. Sinuses/Orbits: Mild chronic mucosal thickening of paranasal sinuses. Prior right cataract resection. Other: None. IMPRESSION: 1. No acute intracranial abnormality. 2. Stable atrophy and chronic small vessel ischemia. Remote left parietal infarct. Electronically Signed   By: Narda Rutherford M.D.   On: 12/08/2022 16:53    Cardiac Studies   [facility] 06/17/2022: Conclusions: Significant multivessel coronary artery disease, as detailed below, including 80% ostial LAD disease followed by sequential 50-60% mid LAD lesions, 50% proximal LCx disease, and 70-80% lesion in proximal segment of rPL1. Widely patent LIMA-LAD and SVG-OM2. Moderately elevated left heart filling pressures. Mildly elevated right heart and pulmonary artery pressures. Normal cardiac output/index.   Recommendations: Escalate diuresis with repeat BMP at follow-up visit next week. Secondary prevention of coronary  artery disease and continued antianginal therapy. 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 __________   2D echo 05/02/2022: 1. Left ventricular ejection fraction, by estimation, is 60 to 65%. The  left ventricle has normal function. The left ventricle has no regional  wall motion abnormalities. Left ventricular diastolic parameters are  consistent with Grade II diastolic  dysfunction (pseudonormalization).   2. Right ventricular systolic function is normal. The right ventricular  size is normal. Tricuspid regurgitation signal is inadequate for assessing  PA pressure.   3. Left atrial size was mildly dilated.   4. The mitral valve is normal in structure. Mild mitral valve  regurgitation. No evidence of mitral stenosis.   5. The aortic valve has been repaired/replaced. Aortic valve  regurgitation is not visualized. No aortic stenosis is present. Aortic  valve area, by VTI measures 2.85 cm. Aortic valve mean gradient measures  15.0 mmHg. s/p AVR bovine in 2017   6. The inferior vena cava is normal in size with greater than 50%  respiratory variability, suggesting right atrial pressure of 3 mmHg.     Patient Profile     77 y.o. 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  Assessment & Plan    Chest pain CAD status post CABG in 2017 - chest pain is fairly atypical. Seems to have chronic generalized fatigue, SOB and chest pain in the setting of CAD with underlying MDS - HS trop negative x 2 - EKG nonischemic - echo showed LVEF 60-65%, mild LVH, G1DD, mild MR - [facility] 06/2022 showed patent grafts with no indication for intervention, with residual disease -  given more atypical chest pain in the setting of MDS and negative troponin, do not suspect we will pursue further ischemic work-up. - continue ASA, Lipitor, Toprol, Imdur, Toprol and Ranexa - may be able to increase Imdur or Ranexa. - if chest  pain continues, may have to repeat a heart cath   Status post AVR with h/o endocarditis - echo had been performed - most recent echo showed normal prosthetic valve function   PAF - h/o Afib -  in NSR - continue Eliquis 5mg  BID and amiodarone 200mg  daily - CHADSVASC at least 6 - continue toprol   HFpEF - patient appears euvolemic on exam - PTA lasix 20mg  BID - s/p lasix 20mg  IV    MDS - Hgb was 11-12 - per hem/onc   Hyperlipidemia - continue PTA Lipitor   Hypertension - Bps mildly elevated  For questions or updates, please contact [facility] Please consult [email or web address] for contact info under        Signed, Nixon Kolton David Stall, PA-C  12/10/2022, 9:39 AM

## 2022-12-10 NOTE — Evaluation (Signed)
Occupational Therapy Evaluation Patient Details Name: Jonathan Cross. 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  Upon admission CT head, CT chest abdomen pelvis does not show any acute pathology.  MD assessment includes: chest pain, undetermined etiology, A-fib, and AKI.   Clinical Impression   Jonathan Cross was seen for OT evaluation this date. Prior to hospital admission, Jonathan Cross was generally independent with ADL management, however he reports recent decline in his ability to engage in meaningful leisure occupations including fishing and getting outside 2/2 increased fatigue and SOB with exertion. Jonathan Cross lives in a 1 level home with his adult children available to provide 24/7 assist. Jonathan Cross presents to acute OT demonstrating impaired ADL performance and functional mobility 2/2 cardiopulmonary status and decreased activity tolerance (See OT problem list). Jonathan Cross currently requires SUPERVISION for functional mobility, and MIN A for LB ADL management.  Jonathan Cross would benefit from skilled OT services to address noted impairments and functional limitations (see below for any additional details) in order to maximize safety and independence while minimizing falls risk and caregiver burden. Do not anticipate the need for follow up OT services upon acute hospital DC.         Recommendations for follow up therapy are one component of a multi-disciplinary discharge planning process, led by the attending physician.  Recommendations may be updated based on patient status, additional functional criteria and insurance authorization.   Assistance Recommended at Discharge    Patient  can return home with the following Assistance with cooking/housework;Help with stairs or ramp for entrance;Assist for transportation    Functional Status Assessment  Patient has had a recent decline in their functional status and demonstrates the ability to make significant improvements in function in a reasonable and predictable amount of time.  Equipment Recommendations  None recommended by OT    Recommendations for Other Services       Precautions / Restrictions Precautions Precautions: Fall Restrictions Weight Bearing Restrictions: No      Mobility Bed Mobility               General bed mobility comments: deferred Jonathan Cross in recliner at start/end of session    Transfers Overall transfer level: Modified independent Equipment used: Rolling walker (2 wheels)               General transfer comment: Fatigues quickly, notably SOB with minimal exertion.      Balance Overall balance assessment: No apparent balance deficits (not formally assessed)                                         ADL either performed or assessed with clinical judgement   ADL Overall ADL's : Needs assistance/impaired                                     Functional mobility during ADLs: Supervision/safety;Rolling walker (2 wheels) General ADL Comments: Jonathan Cross is functionally limted by cardiopulmonary status, decreased activity tolerance, and SOB with exertional activies. He requires SUPERVISION for safety with functional  mobility with RW. Anticipate increased assist with more exertional ADL management including LB dressing and bathing from STS.     Vision Patient Visual Report: No change from baseline       Perception     Praxis      Pertinent Vitals/Pain Pain Assessment Pain Assessment: No/denies pain     Hand Dominance Right   Extremity/Trunk Assessment Upper Extremity Assessment Upper Extremity Assessment: Overall WFL for tasks assessed   Lower  Extremity Assessment Lower Extremity Assessment: Overall WFL for tasks assessed       Communication Communication Communication: No difficulties   Cognition Arousal/Alertness: Awake/alert Behavior During Therapy: WFL for tasks assessed/performed Overall Cognitive Status: Within Functional Limits for tasks assessed                                       General Comments  Jonathan Cross on RA t/o session. Notably SOB with activity, however HR remains 70's t/o session and spO2 WNL 95-97%.    Exercises Other Exercises Other Exercises: Jonathan Cross educated on role of OT in acute setting, safety, falls prevention, and energy conservation strategies including PLB and activity pacing to maximize safety and functional independence upon hospital DC.   Shoulder Instructions      Home Living Family/patient expects to be discharged to:: Private residence Living Arrangements: Children Available Help at Discharge: Family;Available 24 hours/day Type of Home: House Home Access: Ramped entrance     Home Layout: One level     Bathroom Shower/Tub: Producer, television/film/video: Standard Bathroom Accessibility: No   Home Equipment: Grab bars - tub/shower;Rolling Walker (2 wheels);Shower seat;Cane - single point   Additional Comments: Lives with dtr available 24/7      Prior Functioning/Environment Prior Level of Function : Independent/Modified Independent             Mobility Comments: Mod Ind amb in the home with furniture cruising, HHA in community, no fall history ADLs Comments: Ind with ADLs, reports recent decreased in community mobility and IADL management 2/2 fatigue and SOB.        OT Problem List: Cardiopulmonary status limiting activity;Decreased activity tolerance;Decreased knowledge of use of DME or AE      OT Treatment/Interventions: Self-care/ADL training;Therapeutic exercise;Therapeutic activities;Patient/family education;DME and/or AE instruction;Energy  conservation    OT Goals(Current goals can be found in the care plan section) Acute Rehab OT Goals Patient Stated Goal: To go home OT Goal Formulation: With patient Time For Goal Achievement: 12/24/22 Potential to Achieve Goals: Good ADL Goals Jonathan Cross Will Perform Grooming: sitting;with modified independence (with independent use of learned energy conservation strategies.) Jonathan Cross Will Perform Lower Body Dressing: sit to/from stand;with modified independence (with independent use of learned energy conservation strategies.) Jonathan Cross Will Transfer to Toilet: with modified independence;ambulating (with independent use of learned energy conservation strategies.) Jonathan Cross Will Perform Toileting - Clothing Manipulation and hygiene: sit to/from stand;with modified independence (with independent use of learned energy conservation strategies.)  OT Frequency: Min 1X/week    Co-evaluation              AM-PAC OT "6 Clicks" Daily Activity     Outcome Measure Help from another person eating meals?: None Help from another person taking care of personal grooming?: None Help from another person toileting, which includes using toliet, bedpan, or urinal?: A Little Help from another person bathing (including washing, rinsing, drying)?: A Little Help from another person  to put on and taking off regular upper body clothing?: None Help from another person to put on and taking off regular lower body clothing?: A Little 6 Click Score: 21   End of Session Equipment Utilized During Treatment: Rolling walker (2 wheels)  Activity Tolerance: Patient tolerated treatment well Patient left: in chair;with call bell/phone within reach;with chair alarm set  OT Visit Diagnosis: Other abnormalities of gait and mobility (R26.89);Muscle weakness (generalized) (M62.81)                Time: 4098-1191 OT Time Calculation (min): 14 min Charges:  OT General Charges $OT Visit: 1 Visit OT Evaluation $OT Eval Low Complexity: 1 Low  Rockney Ghee, M.S., OTR/L 12/10/22, 12:34 PM

## 2022-12-10 NOTE — Evaluation (Signed)
Physical Therapy Evaluation Patient Details Name: Jonathan Cross. 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  Upon admission CT head, CT chest abdomen pelvis does not show any acute pathology.  MD assessment includes: chest pain, undetermined etiology, A-fib, and AKI.   Clinical Impression  Pt was pleasant and motivated to participate during the session and put forth good effort throughout. Pt required no physical assistance during the session and was steady with transfers and gait.  Pt was able to amb 100+ feet with a RW with several short standing rest breaks that pt reported is baseline for him.  Pt reported no adverse symptoms during the session with SpO2 97-98% throughout and HR increasing from 70s at rest to 80s during ambulation.  Pt reported feeling he is at his functional baseline.  Will keep pt on acute care caseload to continue to monitor and prevent functional decline but no skilled PT services recommended at discharge.         Recommendations for follow up therapy are one component of a multi-disciplinary discharge planning process, led by the attending physician.  Recommendations may be updated based on patient status, additional functional criteria and insurance authorization.  Follow Up Recommendations       Assistance Recommended at Discharge PRN  Patient can return home with the following  Assist for transportation;Help with stairs or ramp for entrance;Assistance with cooking/housework    Equipment Recommendations None recommended by PT  Recommendations for Other Services       Functional Status Assessment Patient has  had a recent decline in their functional status and demonstrates the ability to make significant improvements in function in a reasonable and predictable amount of time.     Precautions / Restrictions Precautions Precautions: Fall Restrictions Weight Bearing Restrictions: No      Mobility  Bed Mobility Overal bed mobility: Independent             General bed mobility comments: Good speed and effort    Transfers Overall transfer level: Modified independent                 General transfer comment: Min extra effort to come to standing from a low surface without the use of UE assist, steady upon standing    Ambulation/Gait Ambulation/Gait assistance: Supervision Gait Distance (Feet): 100 Feet Assistive device: Rolling walker (2 wheels) Gait Pattern/deviations: Step-through pattern, Decreased step length - right, Decreased step length - left Gait velocity: decreased     General Gait Details: Mildly reduced cadence but steady without LOB, no adverse symptoms noted with SpO2 and HR WNL per above  Stairs            Wheelchair Mobility    Modified Rankin (Stroke Patients Only)       Balance Overall balance assessment: No apparent balance deficits (not formally assessed)                                           Pertinent Vitals/Pain Pain Assessment Pain Assessment: No/denies pain    Home Living Family/patient expects to be discharged to:: Private  residence Living Arrangements: Children Available Help at Discharge: Family;Available 24 hours/day Type of Home: House Home Access: Ramped entrance       Home Layout: One level Home Equipment: Grab bars - tub/shower;Rolling Walker (2 wheels);Shower seat;Cane - single point Additional Comments: Lives with dtr available 24/7    Prior Function Prior Level of Function : Independent/Modified Independent             Mobility Comments: Mod Ind amb in the home with furniture cruising,  HHA in community, no fall history ADLs Comments: Ind with ADLs     Hand Dominance   Dominant Hand: Right    Extremity/Trunk Assessment   Upper Extremity Assessment Upper Extremity Assessment: Overall WFL for tasks assessed    Lower Extremity Assessment Lower Extremity Assessment: Overall WFL for tasks assessed       Communication   Communication: No difficulties  Cognition Arousal/Alertness: Awake/alert Behavior During Therapy: WFL for tasks assessed/performed Overall Cognitive Status: Within Functional Limits for tasks assessed                                          General Comments      Exercises     Assessment/Plan    PT Assessment Patient needs continued PT services  PT Problem List Decreased activity tolerance       PT Treatment Interventions DME instruction;Gait training;Functional mobility training;Therapeutic activities;Therapeutic exercise;Balance training;Patient/family education    PT Goals (Current goals can be found in the Care Plan section)  Acute Rehab PT Goals Patient Stated Goal: Decreased chest pain PT Goal Formulation: With patient Time For Goal Achievement: 12/23/22 Potential to Achieve Goals: Good    Frequency Min 2X/week     Co-evaluation               AM-PAC PT "6 Clicks" Mobility  Outcome Measure Help needed turning from your back to your side while in a flat bed without using bedrails?: None Help needed moving from lying on your back to sitting on the side of a flat bed without using bedrails?: None Help needed moving to and from a bed to a chair (including a wheelchair)?: None Help needed standing up from a chair using your arms (e.g., wheelchair or bedside chair)?: A Little Help needed to walk in hospital room?: A Little Help needed climbing 3-5 steps with a railing? : A Little 6 Click Score: 21    End of Session Equipment Utilized During Treatment: Gait belt Activity Tolerance: Patient tolerated  treatment well Patient left: Other (comment) (Pt left in BR for BM with direction to pull cord for nsg when ready) Nurse Communication: Mobility status;Other (comment) (Pt left in BR for BM at end of session) PT Visit Diagnosis: Difficulty in walking, not elsewhere classified (R26.2)    Time: 4098-1191 PT Time Calculation (min) (ACUTE ONLY): 20 min   Charges:   PT Evaluation $PT Eval Moderate Complexity: 1 Mod         D. Scott Westyn Keatley PT, DPT 12/10/22, 9:55 AM

## 2022-12-10 NOTE — Inpatient Diabetes Management (Signed)
Inpatient Diabetes Program Recommendations  AACE/ADA: New Consensus Statement on Inpatient Glycemic Control   Target Ranges:  Prepandial:   less than 140 mg/dL      Peak postprandial:   less than 180 mg/dL (1-2 hours)      Critically ill patients:  140 - 180 mg/dL    Latest Reference Range & Units 12/09/22 07:28 12/09/22 11:10 12/09/22 16:33 12/09/22 22:06 12/10/22 08:14  Glucose-Capillary 70 - 99 mg/dL 098 (H) 119 (H) 147 (H) 183 (H) 208 (H)   Review of Glycemic Control  Diabetes history: DM2 Outpatient Diabetes medications: 70/30 130 units BID Current orders for Inpatient glycemic control: Novolog 0-20 units TID with meals, Novolog 0-5 units QHS  Inpatient Diabetes Program Recommendations:    Insulin: Please consider ordering Semglee 10 units daily and ordering Novolog 3 units TID with meals for meal coverage if patient eats at least 50% of meals.  Thanks,  Orlando Penner, RN, MSN, CDCES Diabetes Coordinator Inpatient Diabetes Program [phone] (Team Pager from 8am to 5pm)

## 2022-12-10 NOTE — Progress Notes (Signed)
PROGRESS NOTE    Jonathan Cross Jonathan Cross.  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  Upon admission CT head, CT chest abdomen pelvis does not show any acute pathology.  Echocardiogram showed preserved EF with grade 1 DD.  At this time opting for medical management.   Assessment & Plan:  Principal Problem:   Chest pain, undetermined etiology Active Problems:   Coronary artery disease of native artery of native heart with stable angina pectoris (HCC)   Status post coronary artery bypass graft   Paroxysmal atrial fibrillation/A-flutter (HCC)   AKI (acute kidney injury) (HCC)   Uncontrolled type 2 diabetes mellitus with hyperglycemia, with long-term current use of insulin (HCC)   Essential hypertension   S/P aortic valve replacement   Chronic heart failure with preserved ejection fraction (HFpEF) (HCC)   Myelodysplastic syndrome (HCC)      * Chest pain, undetermined etiology History of CAD s/p CABG with patent grafts 06/2022, history of MDS, history of endocarditis Troponins are flat, EKG is nonischemic.  Echocardiogram shows EF of 65% with grade 1 DD.  Further plans per cardiology.   Coronary artery disease of native artery of native heart with stable angina pectoris (HCC) History of CABG Possible stable , less likely unstable angina Patient is now on aspirin, statin, Toprol-XL 25 mg daily and Ranexa.  Plan will be to add Imdur if blood pressure tolerates  Paroxysmal atrial fibrillation/A-flutter (HCC) A flutter On EKG but Rate Controlled -Continue amiodarone and Eliquis  AKI (acute kidney injury) (HCC) Hyponatremia Baseline  creatinine 1.0, currently stable  Uncontrolled type 2 diabetes mellitus with hyperglycemia, with long-term current use of insulin (HCC) Blood glucose 350 Sliding scale insulin coverage  Myelodysplastic syndrome (HCC) Macrocytic anemia No acute issues suspected Stable, follows outpatient.    Chronic heart failure with preserved ejection fraction (HFpEF) (HCC) Compensated Continue metoprolol, furosemide  S/P aortic valve replacement History of endocarditis post aortic valve replacement No acute issues suspected   Essential hypertension Continue metoprolol    DVT prophylaxis: apixaban (ELIQUIS) tablet 5 mg  Code Status: Full Family Communication:    Ongoing management by cardiology for chest pain with medication optimization Pressure Injury 12/09/22 Buttocks Right;Left;Medial Stage 2 -  Partial thickness loss of dermis presenting as a shallow open injury with a red, pink wound bed without slough. pt had stage 2 bed sores. now scabbed over. (Active)  12/09/22 0739  Location: Buttocks  Location Orientation: Right;Left;Medial  Staging: Stage 2 -  Partial thickness loss of dermis presenting as a shallow open injury with a red, pink wound bed without slough.  Wound Description (Comments): pt had stage 2 bed sores. now scabbed over.  Present on Admission: Yes     Diet Orders (From admission, onward)     Start     Ordered   12/08/22 2154  Diet heart healthy/carb modified Room service appropriate? Yes; Fluid consistency: Thin  Diet effective now       Question Answer Comment  Diet-HS Snack? Nothing   Room service appropriate? Yes   Fluid consistency: Thin      12/08/22 2154            Subjective: Sitting up in the recliner, no complaints.  Examination: Constitutional: Not in acute distress Respiratory: Clear to auscultation bilaterally Cardiovascular: Normal sinus rhythm, no rubs Abdomen: Nontender nondistended good bowel sounds Musculoskeletal: No edema  noted Skin: No rashes seen Neurologic: CN 2-12 grossly intact.  And nonfocal Psychiatric: Normal judgment and insight. Alert and oriented x 3. Normal mood.     Objective: Vitals:   12/10/22 0008 12/10/22 0429 12/10/22 0429 12/10/22 0534  BP: 131/66 132/67 132/67   Pulse: 64 69 70   Resp: 19 19 19    Temp: 97.8 F (36.6 C) 97.7 F (36.5 C) 97.7 F (36.5 C)   TempSrc: Oral     SpO2: 95% 96% 95%   Weight:    116.7 kg  Height:        Intake/Output Summary (Last 24 hours) at 12/10/2022 0803 Last data filed at 12/10/2022 0500 Gross per 24 hour  Intake 1153.33 ml  Output 550 ml  Net 603.33 ml   Filed Weights   12/08/22 1439 12/09/22 1940 12/10/22 0534  Weight: 118 kg 117.2 kg 116.7 kg    Scheduled Meds:  amiodarone  200 mg Oral Daily   apixaban  5 mg Oral BID   aspirin EC  81 mg Oral Daily   atorvastatin  80 mg Oral QPM   cyanocobalamin  500 mcg Oral Daily   insulin aspart  0-20 Units Subcutaneous TID WC   insulin aspart  0-5 Units Subcutaneous QHS   metoprolol succinate  25 mg Oral Daily   pantoprazole  40 mg Oral Daily   ranolazine  500 mg Oral BID   tamsulosin  0.4 mg Oral Daily   Continuous Infusions:  Nutritional status     Body mass index is 37.99 kg/m.  Data Reviewed:   CBC: Recent Labs  Lab 12/08/22 1440 12/09/22 0951 12/10/22 0347  WBC 2.8* 2.8* 3.4*  NEUTROABS 1.1*  --   --   HGB 9.9* 10.4* 11.1*  HCT 30.7* 32.7* 33.8*  MCV 114.6* 114.7* 113.4*  PLT 133* 140* 144*   Basic Metabolic Panel: Recent Labs  Lab 12/08/22 1440 12/09/22 1223 12/10/22 0347  NA 132* 134* 135  K 4.0 3.8 4.0  CL 97* 101 102  CO2 24 26 24   GLUCOSE 350* 239* 210*  BUN 21 17 20   CREATININE 1.39* 1.07 1.19  CALCIUM 8.6* 8.5* 8.7*  MG  --  2.1 2.2   GFR: Estimated Creatinine Clearance: 63.4 mL/min (by C-G formula based on SCr of 1.19 mg/dL). Liver Function Tests: Recent Labs  Lab 12/08/22 1440  AST 27  ALT 27  ALKPHOS 51  BILITOT 0.8  PROT 7.3  ALBUMIN  3.7   No results for input(s): "LIPASE", "AMYLASE" in the last 168 hours. No results for input(s): "AMMONIA" in the last 168 hours. Coagulation Profile: No results for input(s): "INR", "PROTIME" in the last 168 hours. Cardiac Enzymes: No results for input(s): "CKTOTAL", "CKMB", "CKMBINDEX", "TROPONINI" in the last 168 hours. BNP (last 3 results) No results for input(s): "PROBNP" in the last 8760 hours. HbA1C: Recent Labs    12/08/22 2338  HGBA1C 8.1*   CBG: Recent Labs  Lab 12/09/22 0003 12/09/22 0728 12/09/22 1110 12/09/22 1633 12/09/22 2206  GLUCAP 160* 207* 277* 213* 183*   Lipid Profile: No results for input(s): "CHOL", "HDL", "LDLCALC", "TRIG", "CHOLHDL", "LDLDIRECT" in the last 72 hours. Thyroid Function Tests: No results for input(s): "TSH", "T4TOTAL", "FREET4", "T3FREE", "THYROIDAB" in the last 72 hours. Anemia Panel: No results for input(s): "VITAMINB12", "FOLATE", "FERRITIN", "TIBC", "IRON", "RETICCTPCT" in the  last 72 hours. Sepsis Labs: No results for input(s): "PROCALCITON", "LATICACIDVEN" in the last 168 hours.  Recent Results (from the past 240 hour(s))  Culture, blood (Routine X 2) w Reflex to ID Panel     Status: None (Preliminary result)   Collection Time: 12/08/22 11:38 PM   Specimen: BLOOD  Result Value Ref Range Status   Specimen Description BLOOD BLOOD RIGHT ARM  Final   Special Requests   Final    BOTTLES DRAWN AEROBIC AND ANAEROBIC Blood Culture adequate volume   Culture   Final    NO GROWTH 1 DAY Performed at [facility], [address], [place], [place] [address]    Report Status PENDING  Incomplete  Culture, blood (Routine X 2) w Reflex to ID Panel     Status: None (Preliminary result)   Collection Time: 12/08/22 11:38 PM   Specimen: BLOOD  Result Value Ref Range Status   Specimen Description BLOOD BLOOD LEFT ARM  Final   Special Requests   Final    BOTTLES DRAWN AEROBIC AND ANAEROBIC Blood Culture adequate volume   Culture    Final    NO GROWTH 1 DAY Performed at [facility], [address] [place], [place] [address]    Report Status PENDING  Incomplete         Radiology Studies: ECHOCARDIOGRAM COMPLETE  Result Date: 12/09/2022    ECHOCARDIOGRAM REPORT   Patient Name:   Jonathan Cross [facility]. Date of Exam: 12/09/2022 Medical Rec #:  [id number]              Height:       69.0 in Accession #:    [id number]             Weight:       260.1 lb Date of Birth:  [birth year]              BSA:          2.310 m Patient Age:    3 years               BP:           151/70 mmHg Patient Gender: M                      HR:           78 bpm. Exam Location:  [facility] Procedure: 2D Echo, Cardiac Doppler and Color Doppler Indications:     Chest pain R07.9  History:         Patient has prior history of Echocardiogram examinations, most                  recent 05/02/2022. Risk Factors:Hypertension. Aortic valve                  replacement.                  Aortic Valve: 23 mm bovine valve is present in the aortic                  position.  Sonographer:     Cristela Blue Referring Phys:  [id number] Andris Baumann Diagnosing Phys: Yvonne Kendall MD  Sonographer Comments: Suboptimal apical window. IMPRESSIONS  1. Left ventricular ejection fraction, by estimation, is 60 to 65%. The left ventricle has normal function. Left ventricular endocardial border not optimally defined to evaluate regional wall motion. There is mild left ventricular hypertrophy. Left  ventricular diastolic parameters are consistent with Grade I diastolic dysfunction (impaired relaxation).  2. Right ventricular systolic function is mildly reduced. The right ventricular size is normal. Tricuspid regurgitation signal is inadequate for assessing PA pressure.  3. The mitral valve is abnormal. Mild mitral valve regurgitation. No evidence of mitral stenosis.  4. The aortic valve was not well visualized. Aortic valve regurgitation is not visualized. No aortic stenosis is present.  There is a 23 mm bovine valve present in the aortic position. FINDINGS  Left Ventricle: Left ventricular ejection fraction, by estimation, is 60 to 65%. The left ventricle has normal function. Left ventricular endocardial border not optimally defined to evaluate regional wall motion. The left ventricular internal cavity size was normal in size. There is mild left ventricular hypertrophy. Left ventricular diastolic parameters are consistent with Grade I diastolic dysfunction (impaired relaxation). Right Ventricle: The right ventricular size is normal. No increase in right ventricular wall thickness. Right ventricular systolic function is mildly reduced. Tricuspid regurgitation signal is inadequate for assessing PA pressure. Left Atrium: Left atrial size was normal in size. Right Atrium: Right atrial size was normal in size. Pericardium: The pericardium was not well visualized. Mitral Valve: The mitral valve is abnormal. There is mild thickening of the mitral valve leaflet(s). There is mild calcification of the mitral valve leaflet(s). Mild mitral valve regurgitation. No evidence of mitral valve stenosis. MV peak gradient, 6.5 mmHg. The mean mitral valve gradient is 3.0 mmHg. Tricuspid Valve: The tricuspid valve is not well visualized. Tricuspid valve regurgitation is trivial. Aortic Valve: The aortic valve was not well visualized. Aortic valve regurgitation is not visualized. No aortic stenosis is present. Aortic valve mean gradient measures 5.0 mmHg. Aortic valve peak gradient measures 8.8 mmHg. Aortic valve area, by VTI measures 1.70 cm. There is a 23 mm bovine valve present in the aortic position. Pulmonic Valve: The pulmonic valve was not well visualized. Pulmonic valve regurgitation is not visualized. No evidence of pulmonic stenosis. Aorta: The aortic root is normal in size and structure. Pulmonary Artery: The pulmonary artery is not well seen. Venous: The inferior vena cava was not well visualized.  IAS/Shunts: The interatrial septum was not well visualized.  LEFT VENTRICLE PLAX 2D LVIDd:         3.90 cm   Diastology LVIDs:         2.80 cm   LV e' medial:    4.13 cm/s LV PW:         1.20 cm   LV E/e' medial:  21.1 LV IVS:        1.25 cm   LV e' lateral:   11.60 cm/s LVOT diam:     2.00 cm   LV E/e' lateral: 7.5 LV SV:         50 LV SV Index:   22 LVOT Area:     3.14 cm  RIGHT VENTRICLE RV Basal diam:  3.00 cm RV Mid diam:    2.80 cm RV S prime:     7.83 cm/s TAPSE (M-mode): 1.7 cm LEFT ATRIUM             Index        RIGHT ATRIUM           Index LA diam:        3.70 cm 1.60 cm/m   RA Area:     11.84 cm LA Vol (A2C):   30.2 ml 13.07 ml/m  RA Volume:   24.94 ml  10.79 ml/m  LA Vol (A4C):   31.2 ml 13.51 ml/m LA Biplane Vol: 32.3 ml 13.98 ml/m  AORTIC VALVE AV Area (Vmax):    1.71 cm AV Area (Vmean):   1.82 cm AV Area (VTI):     1.70 cm AV Vmax:           148.00 cm/s AV Vmean:          100.925 cm/s AV VTI:            0.296 m AV Peak Grad:      8.8 mmHg AV Mean Grad:      5.0 mmHg LVOT Vmax:         80.60 cm/s LVOT Vmean:        58.400 cm/s LVOT VTI:          0.160 m LVOT/AV VTI ratio: 0.54  AORTA Ao Root diam: 3.00 cm MITRAL VALVE MV Area (PHT): 3.00 cm     SHUNTS MV Area VTI:   1.67 cm     Systemic VTI:  0.16 m MV Peak grad:  6.5 mmHg     Systemic Diam: 2.00 cm MV Mean grad:  3.0 mmHg MV Vmax:       1.27 m/s MV Vmean:      75.7 cm/s MV Decel Time: 253 msec MV E velocity: 87.00 cm/s MV A velocity: 119.00 cm/s MV E/A ratio:  0.73 Cristal Deer End MD Electronically signed by Yvonne Kendall MD Signature Date/Time: 12/09/2022/12:47:54 PM    Final    CT ABDOMEN PELVIS W CONTRAST  Result Date: 12/08/2022 CLINICAL DATA:  Abdominal pain. EXAM: CT ABDOMEN AND PELVIS WITH CONTRAST TECHNIQUE: Multidetector CT imaging of the abdomen and pelvis was performed using the standard protocol following bolus administration of intravenous contrast. RADIATION DOSE REDUCTION: This exam was performed according to the  departmental dose-optimization program which includes automated exposure control, adjustment of the mA and/or kV according to patient size and/or use of iterative reconstruction technique. CONTRAST:  OMNIPAQUE IOHEXOL 350 MG/ML SOLN COMPARISON:  August 11, 2022 FINDINGS: Hepatobiliary: Several few mm too small to be actually characterized hypoattenuated lesions throughout the liver. Decompressed gallbladder. Pancreas: Unremarkable. No pancreatic ductal dilatation or surrounding inflammatory changes. Spleen: Normal in size without focal abnormality. Adrenals/Urinary Tract: Adrenal glands are unremarkable. Kidneys are normal, without renal calculi, focal lesion, or hydronephrosis. Left parapelvic cyst. Bladder is unremarkable. Stomach/Bowel: Stomach is within normal limits. No evidence of bowel wall thickening, distention, or inflammatory changes. Vascular/Lymphatic: Aortic atherosclerosis. No enlarged abdominal or pelvic lymph nodes. Reproductive: Mildly enlarged prostate gland. Other: Nonspecific fat stranding in the subcutaneous tissues in the right mid abdomen. Musculoskeletal: Spondylosis of the lumbosacral spine. IMPRESSION: 1. No acute findings within the abdomen or pelvis. 2. Nonspecific fat stranding in the subcutaneous tissues in the right mid abdomen. 3. Aortic atherosclerosis. 4. Several few mm too small to be actually characterized hypoattenuated lesions throughout the liver. Aortic Atherosclerosis (ICD10-I70.0). Electronically Signed   By: Ted Mcalpine M.D.   On: 12/08/2022 17:04   CT Angio Chest PE W and/or Wo Contrast  Result Date: 12/08/2022 CLINICAL DATA:  Pulmonary embolus suspected. EXAM: CT ANGIOGRAPHY CHEST WITH CONTRAST TECHNIQUE: Multidetector CT imaging of the chest was performed using the standard protocol during bolus administration of intravenous contrast. Multiplanar CT image reconstructions and MIPs were obtained to evaluate the vascular anatomy. RADIATION DOSE  REDUCTION: This exam was performed according to the departmental dose-optimization program which includes automated exposure control, adjustment of the mA and/or kV according to patient  size and/or use of iterative reconstruction technique. CONTRAST:  OMNIPAQUE IOHEXOL 350 MG/ML SOLN COMPARISON:  January 10, 2023 FINDINGS: Cardiovascular: Satisfactory opacification of the pulmonary arteries to the segmental level. No evidence of pulmonary embolism. Normal heart size. No pericardial effusion. Calcific atherosclerotic disease of the coronary arteries and aorta. Postsurgical changes from CABG. Mediastinum/Nodes: No enlarged mediastinal, hilar, or axillary lymph nodes. Thyroid gland, trachea, and esophagus demonstrate no significant findings. Lungs/Pleura: Lungs are clear. No pleural effusion or pneumothorax. Musculoskeletal: No chest wall abnormality. No acute or significant osseous findings. Review of the MIP images confirms the above findings. IMPRESSION: 1. No evidence of pulmonary embolus. 2. Calcific atherosclerotic disease of the coronary arteries and aorta. 3. Postsurgical changes from CABG. 4. Aortic atherosclerosis. Aortic Atherosclerosis (ICD10-I70.0). Electronically Signed   By: Ted Mcalpine M.D.   On: 12/08/2022 16:58   CT HEAD WO CONTRAST ( )  Result Date: 12/08/2022 CLINICAL DATA:  Headache, new onset (Age >= 51y) EXAM: CT HEAD WITHOUT CONTRAST TECHNIQUE: Contiguous axial images were obtained from the base of the skull through the vertex without intravenous contrast. RADIATION DOSE REDUCTION: This exam was performed according to the departmental dose-optimization program which includes automated exposure control, adjustment of the mA and/or kV according to patient size and/or use of iterative reconstruction technique. COMPARISON:  11/01/2021 FINDINGS: Brain: No evidence of acute infarction, hemorrhage, hydrocephalus, extra-axial collection or mass lesion/mass effect. Stable degree of  atrophy and chronic small vessel ischemia. Remote left parietal infarct. Vascular: Atherosclerosis of skullbase vasculature without hyperdense vessel or abnormal calcification. Skull: No fracture or focal lesion. Sinuses/Orbits: Mild chronic mucosal thickening of paranasal sinuses. Prior right cataract resection. Other: None. IMPRESSION: 1. No acute intracranial abnormality. 2. Stable atrophy and chronic small vessel ischemia. Remote left parietal infarct. Electronically Signed   By: Narda Rutherford M.D.   On: 12/08/2022 16:53           LOS: 0 days   Time spent= 35 mins    Anniah Glick Joline Maxcy, MD [facility]  If 7PM-7AM, please contact night-coverage  12/10/2022, 8:03 AM

## 2022-12-11 DIAGNOSIS — R0789 Other chest pain: Secondary | ICD-10-CM | POA: Diagnosis present

## 2022-12-11 DIAGNOSIS — E1165 Type 2 diabetes mellitus with hyperglycemia: Secondary | ICD-10-CM | POA: Diagnosis present

## 2022-12-11 DIAGNOSIS — D539 Nutritional anemia, unspecified: Secondary | ICD-10-CM | POA: Diagnosis present

## 2022-12-11 DIAGNOSIS — I5032 Chronic diastolic (congestive) heart failure: Secondary | ICD-10-CM

## 2022-12-11 DIAGNOSIS — L89312 Pressure ulcer of right buttock, stage 2: Secondary | ICD-10-CM | POA: Diagnosis present

## 2022-12-11 DIAGNOSIS — I10 Essential (primary) hypertension: Secondary | ICD-10-CM | POA: Diagnosis not present

## 2022-12-11 DIAGNOSIS — Z953 Presence of xenogenic heart valve: Secondary | ICD-10-CM | POA: Diagnosis not present

## 2022-12-11 DIAGNOSIS — I4892 Unspecified atrial flutter: Secondary | ICD-10-CM | POA: Diagnosis present

## 2022-12-11 DIAGNOSIS — G4733 Obstructive sleep apnea (adult) (pediatric): Secondary | ICD-10-CM | POA: Diagnosis present

## 2022-12-11 DIAGNOSIS — R0602 Shortness of breath: Secondary | ICD-10-CM

## 2022-12-11 DIAGNOSIS — Z7901 Long term (current) use of anticoagulants: Secondary | ICD-10-CM | POA: Diagnosis not present

## 2022-12-11 DIAGNOSIS — R079 Chest pain, unspecified: Secondary | ICD-10-CM | POA: Diagnosis not present

## 2022-12-11 DIAGNOSIS — I480 Paroxysmal atrial fibrillation: Secondary | ICD-10-CM | POA: Diagnosis present

## 2022-12-11 DIAGNOSIS — Z79899 Other long term (current) drug therapy: Secondary | ICD-10-CM | POA: Diagnosis not present

## 2022-12-11 DIAGNOSIS — I25118 Atherosclerotic heart disease of native coronary artery with other forms of angina pectoris: Secondary | ICD-10-CM | POA: Diagnosis present

## 2022-12-11 DIAGNOSIS — I4510 Unspecified right bundle-branch block: Secondary | ICD-10-CM | POA: Diagnosis present

## 2022-12-11 DIAGNOSIS — R0603 Acute respiratory distress: Secondary | ICD-10-CM | POA: Diagnosis present

## 2022-12-11 DIAGNOSIS — E785 Hyperlipidemia, unspecified: Secondary | ICD-10-CM | POA: Diagnosis present

## 2022-12-11 DIAGNOSIS — E669 Obesity, unspecified: Secondary | ICD-10-CM | POA: Diagnosis present

## 2022-12-11 DIAGNOSIS — I25110 Atherosclerotic heart disease of native coronary artery with unstable angina pectoris: Secondary | ICD-10-CM | POA: Diagnosis not present

## 2022-12-11 DIAGNOSIS — D469 Myelodysplastic syndrome, unspecified: Secondary | ICD-10-CM | POA: Diagnosis present

## 2022-12-11 DIAGNOSIS — D61818 Other pancytopenia: Secondary | ICD-10-CM | POA: Diagnosis present

## 2022-12-11 DIAGNOSIS — Z794 Long term (current) use of insulin: Secondary | ICD-10-CM | POA: Diagnosis not present

## 2022-12-11 DIAGNOSIS — E871 Hypo-osmolality and hyponatremia: Secondary | ICD-10-CM | POA: Diagnosis present

## 2022-12-11 DIAGNOSIS — I071 Rheumatic tricuspid insufficiency: Secondary | ICD-10-CM | POA: Diagnosis present

## 2022-12-11 DIAGNOSIS — G8929 Other chronic pain: Secondary | ICD-10-CM | POA: Diagnosis present

## 2022-12-11 DIAGNOSIS — N179 Acute kidney failure, unspecified: Secondary | ICD-10-CM | POA: Diagnosis present

## 2022-12-11 DIAGNOSIS — L89322 Pressure ulcer of left buttock, stage 2: Secondary | ICD-10-CM | POA: Diagnosis present

## 2022-12-11 DIAGNOSIS — I110 Hypertensive heart disease with heart failure: Secondary | ICD-10-CM | POA: Diagnosis present

## 2022-12-11 DIAGNOSIS — I200 Unstable angina: Secondary | ICD-10-CM | POA: Diagnosis not present

## 2022-12-11 LAB — GLUCOSE, CAPILLARY
Glucose-Capillary: 110 mg/dL — ABNORMAL HIGH (ref 70–99)
Glucose-Capillary: 243 mg/dL — ABNORMAL HIGH (ref 70–99)
Glucose-Capillary: 231 mg/dL — ABNORMAL HIGH (ref 70–99)
Glucose-Capillary: 302 mg/dL — ABNORMAL HIGH (ref 70–99)
Glucose-Capillary: 256 mg/dL — ABNORMAL HIGH (ref 70–99)

## 2022-12-11 LAB — MAGNESIUM: Magnesium: 2.2 mg/dL (ref 1.7–2.4)

## 2022-12-11 LAB — BASIC METABOLIC PANEL
Anion gap: 5 (ref 5–15)
BUN: 23 mg/dL (ref 8–23)
CO2: 27 mmol/L (ref 22–32)
Calcium: 8.4 mg/dL — ABNORMAL LOW (ref 8.9–10.3)
Chloride: 100 mmol/L (ref 98–111)
Creatinine, Ser: 1.19 mg/dL (ref 0.61–1.24)
GFR, Estimated: >60 mL/min (ref >60)
Glucose, Bld: 224 mg/dL — ABNORMAL HIGH (ref 70–99)
Potassium: 3.8 mmol/L (ref 3.5–5.1)
Sodium: 132 mmol/L — ABNORMAL LOW (ref 135–145)

## 2022-12-11 LAB — CBC
HCT: 32.1 % — ABNORMAL LOW (ref 39.0–52.0)
Hemoglobin: 10.3 g/dL — ABNORMAL LOW (ref 13.0–17.0)
MCH: 36.1 pg — ABNORMAL HIGH (ref 26.0–34.0)
MCHC: 32.1 g/dL (ref 30.0–36.0)
MCV: 112.6 fL — ABNORMAL HIGH (ref 80.0–100.0)
Platelets: 129 10*3/uL — ABNORMAL LOW (ref 150–400)
RBC: 2.85 MIL/uL — ABNORMAL LOW (ref 4.22–5.81)
RDW: 15 % (ref 11.5–15.5)
WBC: 4.4 10*3/uL (ref 4.0–10.5)
nRBC: 0 % (ref 0.0–0.2)

## 2022-12-11 LAB — CULTURE, BLOOD (ROUTINE X 2): Culture: NO GROWTH

## 2022-12-11 MED ORDER — ISOSORBIDE MONONITRATE ER 30 MG PO TB24
15.0000 mg | ORAL_TABLET | Freq: Once | ORAL | Status: AC
  Administered 2022-12-11: 15 mg via ORAL
  Filled 2022-12-11: qty 1

## 2022-12-11 MED ORDER — APIXABAN 5 MG PO TABS
5.0000 mg | ORAL_TABLET | Freq: Two times a day (BID) | ORAL | Status: DC
  Administered 2022-12-11 – 2022-12-14 (×7): 5 mg via ORAL
  Filled 2022-12-11 (×7): qty 1

## 2022-12-11 MED ORDER — ISOSORBIDE MONONITRATE ER 30 MG PO TB24
30.0000 mg | ORAL_TABLET | Freq: Every day | ORAL | Status: DC
  Administered 2022-12-12 – 2022-12-14 (×3): 30 mg via ORAL
  Filled 2022-12-11 (×3): qty 1

## 2022-12-11 MED ORDER — FUROSEMIDE 10 MG/ML IJ SOLN
20.0000 mg | Freq: Once | INTRAMUSCULAR | Status: AC
  Administered 2022-12-11: 20 mg via INTRAVENOUS
  Filled 2022-12-11: qty 2

## 2022-12-11 NOTE — Progress Notes (Signed)
Physical Therapy Treatment Patient Details Name: Jonathan Cross. 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  Upon admission CT head, CT chest abdomen pelvis does not show any acute pathology.  MD assessment includes: chest pain, undetermined etiology, A-fib, and AKI.    PT Comments    Pt seen for PT tx with pt received in bed, pt with c/o feeling unwell but unable to describe, only states "I don't feel like breathing" - nurse made aware. Pt encouraged to mobilize but once standing & stepping towards end of bed pt reports need to sit down. Pt willing to transfer to recliner & does so with HHA min assist & performs BLE LAQ. Pt encouraged to at least sit in recliner throughout the day. Pt anticipating heart cath tomorrow.   Recommendations for follow up therapy are one component of a multi-disciplinary discharge planning process, led by the attending physician.  Recommendations may be updated based on patient status, additional functional criteria and insurance authorization.  Follow Up Recommendations       Assistance Recommended at Discharge PRN  Patient can return home with the following Assist for transportation;Help with stairs or ramp for entrance;Assistance with cooking/housework   Equipment Recommendations  None recommended by PT    Recommendations for Other Services       Precautions / Restrictions Precautions Precautions: Fall Restrictions Weight Bearing Restrictions: No     Mobility  Bed Mobility Overal bed mobility: Modified Independent             General bed mobility comments: supine>sit with HOB elevated, use  of bed rails    Transfers Overall transfer level: Needs assistance Equipment used: Rolling walker (2 wheels) Transfers: Sit to/from Stand Sit to Stand: Modified independent (Device/Increase time)           General transfer comment: STS from EOB with RW, STS from EOB with LUE HHA & RUE pushing on bed rail    Ambulation/Gait Ambulation/Gait assistance: Supervision, Min guard Gait Distance (Feet): 3 Feet Assistive device: Rolling walker (2 wheels) Gait Pattern/deviations: Decreased stride length, Decreased step length - right, Decreased step length - left Gait velocity: decreased     General Gait Details: Pt ambulates to end of bed then reports need to sit down 2/2 feeling unwell.   Stairs             Wheelchair Mobility    Modified Rankin (Stroke Patients Only)       Balance     Sitting balance-Leahy Scale: Good     Standing balance support: Reliant on assistive device for balance, Bilateral upper extremity supported Standing balance-Leahy Scale: Fair                              Cognition Arousal/Alertness: Awake/alert Behavior During Therapy: WFL for tasks assessed/performed Overall Cognitive Status: Within Functional Limits for tasks assessed                                 General Comments: Pt c/o feeling unwell, states "I don't feel like breathing". Nurse made aware. Pt reports  he's been feeling unwell since admission.        Exercises General Exercises - Lower Extremity Long Arc Quad: AROM, Strengthening, Both, 10 reps, Seated    General Comments General comments (skin integrity, edema, etc.): HR 79 bpm      Pertinent Vitals/Pain Pain Assessment Pain Assessment: No/denies pain    Home Living                          Prior Function            PT Goals (current goals can now be found in the care plan section) Acute Rehab PT Goals Patient Stated Goal: feel better PT Goal Formulation: With  patient Time For Goal Achievement: 12/23/22 Potential to Achieve Goals: Good Progress towards PT goals: Progressing toward goals    Frequency    Min 2X/week      PT Plan Current plan remains appropriate    Co-evaluation              AM-PAC PT "6 Clicks" Mobility   Outcome Measure  Help needed turning from your back to your side while in a flat bed without using bedrails?: None Help needed moving from lying on your back to sitting on the side of a flat bed without using bedrails?: None Help needed moving to and from a bed to a chair (including a wheelchair)?: A Little Help needed standing up from a chair using your arms (e.g., wheelchair or bedside chair)?: A Little Help needed to walk in hospital room?: A Little Help needed climbing 3-5 steps with a railing? : A Little 6 Click Score: 20    End of Session   Activity Tolerance: Patient limited by fatigue (limited 2/2 feeling unwell) Patient left: in chair;with chair alarm set;with call bell/phone within reach Nurse Communication:  (pt with c/o feeling unwell) PT Visit Diagnosis: Difficulty in walking, not elsewhere classified (R26.2);Muscle weakness (generalized) (M62.81)     Time: 1610-9604 PT Time Calculation (min) (ACUTE ONLY): 8 min  Charges:  $Therapeutic Activity: 8-22 mins                     Aleda Grana, PT, DPT 12/11/22, 11:24 AM   Sandi Mariscal 12/11/2022, 11:22 AM

## 2022-12-11 NOTE — Consult Note (Signed)
NAME:  Jonathan Stvincent., MRN:  [id number], DOB:  1946/02/28, LOS: 0 ADMISSION DATE:  12/08/2022, CHIEF COMPLAINT:  Shortness of breath   History of Present Illness:   Mr. Jonathan Cross is a 77 year old male patient with multiple medical problems (including CAD s/p CABG, aortic stenosis status post AVR, A-fib, HFpEF, MDS, anemia, diabetes, sleep apnea, and obesity) who presents to the hospital with exertional chest pain and shortness of breath.  Patient chronically complains of exertional dyspnea that has started over 6 years ago and for which she has been evaluated multiple times by his cardiologist and primary care physician.  He is now admitted (on 12/08/2022) for said chief complaint.  He previously underwent an extensive cardiac evaluation including left and right heart cath in December 2023 showing native multivessel disease but patent grafts (80% LAD, 50% left circumflex, 80% RPL 1, widely patent LIMA to LAD and SVG to OM 2).  Right heart cath showed a PA pressure of 50/20 (30) and a pulmonary capillary wedge pressure of 25. Patient's other medical problems include MDS for which he has had transfusions in the past.  Today, he tells me that his shortness of breath is exertional and he feels he is unable to walk very short distances inside his house without having to stop to catch his breath.  He does not report angina or chest pain.  He denies cough, wheeze, stridor, sputum production, or neck pain. He does not feel that the shortness of breath is worse when he lays flat nor when he leans over.  He does note some lower extremity edema which is chronic for him.  He has not had any weakness, dizziness, night sweats, fevers, chills, or other GU/GI symptoms.  On presentation, he underwent a CT scan of the chest with PE protocol that was negative for clot.  He was also seen in consultation with cardiology given his history of CAD, heart failure, A-fib, and AVR and recommendation made for gentle diuresis  and holding off on repeat heart cath.  Pulmonology consultation requested for the evaluation of acute on chronic waxing and waning shortness of breath.  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  He denies any asbestos exposure.  He is a lifelong non-smoker (does report smoking for a month over 50 years ago) and denies any other inhalational exposures. 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   Objective   Blood pressure 112/68, pulse 69, temperature 97.7 F (36.5 C), resp. rate 18, height 5\' 9"  (1.753 m), weight 116.7 kg, SpO2 97 %.        Intake/Output Summary (Last 24 hours) at 12/11/2022 1547 Last data filed at 12/11/2022 1459 Gross per 24 hour  Intake 240 ml  Output 1300 ml  Net -1060 ml   Filed Weights   12/08/22 1439 12/09/22 1940 12/10/22 0534  Weight: 118 kg 117.2 kg 116.7 kg    Examination: Physical Exam Constitutional:      General: He is not in acute distress.    Appearance: He is obese. He is not ill-appearing.  HENT:     Nose: Nose normal.     Mouth/Throat:     Mouth: Mucous membranes are moist.  Eyes:     Extraocular Movements: Extraocular movements intact.  Cardiovascular:     Pulses: Normal pulses.     Heart sounds: Normal heart sounds.  Pulmonary:     Effort: Pulmonary effort is normal.  Breath sounds: Rales present.  Abdominal:     General: There is distension.     Palpations: Abdomen is soft.  Musculoskeletal:     Cervical back: Neck supple.     Right lower leg: Edema present.     Left lower leg: Edema present.  Neurological:     General: No focal deficit present.     Mental Status: He is alert.     Assessment & Plan:   #Shortness of Breath  Patient is a pleasant 77 year old male admitted to the hospital with shortness of breath that is acute on chronic.  Review of his recent chest CT shows normal lung parenchyma with no ground glass opacities or reticulation to suggest interstitial lung disease.  Patient is a life long non-smoker and his risk for COPD is low; furthermore there was no finding of emphysema or cysts on chest CT. The study was also negative for PE and there was no finding of hiatal hernia. He does have significant central obesity which could limit and restrict diaphragmatic excursion, contributing to his shortness of breath.  Furthermore, he has an extensive cardiac history, with a right heart cath from December of 2023 showing a significantly elevated pulmonary capillary wedge pressure consistent with HFpEF. He does have lower extremity edema and has gained weight recently, and gentle diuresis was initiated under guidance from cardiology. Atrial fibrillation and a history of aortic stenosis s/p AVR could also contribute to his symptoms.  Overall, his shortness of breath is multi-factorial, with HFpEF, CAD, anemia,  and central obesity with restrictive physiology all contributing to his shortness of breath. Parenchymal lung disease, COPD or other obstructive lung disease is unlikely given lack of smoking history, exposures, and normal appearing chest CT. While we could consider obtaining PFT's on an outpatient basis, I don't the test would offer further insight besides signs of restriction and a depressed diffusion capacity. I recommend continued diuresis under the guidance of cardiology and aggressive attempts at weight loss.  Raechel Chute, MD [facility] 12/11/2022 6:01 PM    Labs   CBC: Recent Labs  Lab 12/08/22 1440 12/09/22 0951 12/10/22 0347 12/11/22 0330  WBC 2.8* 2.8* 3.4* 4.4  NEUTROABS 1.1*  --   --   --   HGB 9.9* 10.4* 11.1* 10.3*  HCT 30.7* 32.7* 33.8* 32.1*  MCV 114.6* 114.7* 113.4* 112.6*  PLT 133* 140* 144* 129*    Basic Metabolic Panel: Recent Labs  Lab 12/08/22 1440 12/09/22 1223 12/10/22 0347 12/11/22 0330  NA 132* 134* 135 132*  K 4.0 3.8 4.0 3.8  CL 97* 101 102 100  CO2 24 26 24 27   GLUCOSE 350* 239* 210* 224*  BUN  21 17 20 23   CREATININE 1.39* 1.07 1.19 1.19  CALCIUM 8.6* 8.5* 8.7* 8.4*  MG  --  2.1 2.2 2.2   GFR: Estimated Creatinine Clearance: 63.4 mL/min (by C-G formula based on SCr of 1.19 mg/dL). Recent Labs  Lab 12/08/22 1440 12/09/22 0951 12/10/22 0347 12/11/22 0330  WBC 2.8* 2.8* 3.4* 4.4    Liver Function Tests: Recent Labs  Lab 12/08/22 1440  AST 27  ALT 27  ALKPHOS 51  BILITOT 0.8  PROT 7.3  ALBUMIN 3.7   No results for input(s): "LIPASE", "AMYLASE" in the last 168 hours. No results for input(s): "AMMONIA" in the last 168 hours.  ABG    Component Value Date/Time   PHART 7.408 06/17/2022 1420   PCO2ART 36.0 06/17/2022 1420   PO2ART 65 (L) 06/17/2022  1420   HCO3 22.7 06/17/2022 1420   TCO2 24 06/17/2022 1420   ACIDBASEDEF 2.0 06/17/2022 1420   O2SAT 93 06/17/2022 1420     Coagulation Profile: No results for input(s): "INR", "PROTIME" in the last 168 hours.  Cardiac Enzymes: No results for input(s): "CKTOTAL", "CKMB", "CKMBINDEX", "TROPONINI" in the last 168 hours.  HbA1C: Hgb A1c MFr Bld  Date/Time Value Ref Range Status  12/08/2022 11:38 PM 8.1 (H) 4.8 - 5.6 % Final    Comment:    (NOTE)         Prediabetes: 5.7 - 6.4         Diabetes: >6.4         Glycemic control for adults with diabetes: <7.0   05/02/2022 04:05 AM 8.3 (H) 4.8 - 5.6 % Final    Comment:    (NOTE) Pre diabetes:          5.7%-6.4%  Diabetes:              >6.4%  Glycemic control for   <7.0% adults with diabetes     CBG: Recent Labs  Lab 12/10/22 1644 12/10/22 2152 12/11/22 0801 12/11/22 0908 12/11/22 1159  GLUCAP 228* 319* 110* 243* 231*    Review of Systems:   Review of Systems  Constitutional:  Negative for chills, fever, malaise/fatigue and weight loss.  Respiratory:  Positive for shortness of breath. Negative for cough, hemoptysis, sputum production and wheezing.   Cardiovascular:  Positive for leg swelling. Negative for chest pain.     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   Surgical History:   Past Surgical History:  Procedure Laterality Date   AORTIC VALVE REPLACEMENT  2017   [facility], bioprosthetic   APPENDECTOMY     BONE MARROW BIOPSY     05/2022   CARDIAC VALVE REPLACEMENT     CATARACT EXTRACTION W/PHACO Right 11/12/2022   Procedure: CATARACT EXTRACTION PHACO AND INTRAOCULAR LENS PLACEMENT (IOC) RIGHT DIABETIC MALYUGIN  13.49  01:06.1;  Surgeon: Lockie Mola, MD;  Location: [facility];  Service: Ophthalmology;  Laterality: Right;  Diabetic   CORONARY ARTERY BYPASS GRAFT  2017   [facility] - LIMA-LAD and SVG-OM   CYSTOSCOPY W/ RETROGRADES Right 05/27/2019   Procedure: CYSTOSCOPY WITH RETROGRADE PYELOGRAM;  Surgeon: Sondra Come, MD;  Location: [facility];  Service: Urology;  Laterality: Right;   CYSTOSCOPY W/ RETROGRADES Right 07/11/2019   Procedure: CYSTOSCOPY WITH RETROGRADE PYELOGRAM;  Surgeon: Sondra Come, MD;  Location: [facility];  Service: Urology;  Laterality: Right;   CYSTOSCOPY/URETEROSCOPY/HOLMIUM LASER/STENT PLACEMENT Right 05/27/2019   Procedure: CYSTOSCOPY/URETEROSCOPY/HOLMIUM LASER/STENT PLACEMENT;  Surgeon: Sondra Come, MD;  Location: [facility];  Service: Urology;  Laterality: Right;   CYSTOSCOPY/URETEROSCOPY/HOLMIUM LASER/STENT PLACEMENT Right 06/17/2019   Procedure: CYSTOSCOPY/URETEROSCOPY/HOLMIUM LASER/STENT Exchange;  Surgeon: Sondra Come, MD;  Location: [facility];  Service: Urology;  Laterality:  Right;   CYSTOSCOPY/URETEROSCOPY/HOLMIUM LASER/STENT PLACEMENT Right 07/11/2019   Procedure: CYSTOSCOPY/URETEROSCOPY/HOLMIUM LASER/STENT Exchange;  Surgeon: Sondra Come, MD;  Location: [facility];  Service: Urology;  Laterality: Right;   RIGHT HEART CATH AND CORONARY/GRAFT ANGIOGRAPHY Bilateral 06/17/2022  Procedure: RIGHT HEART CATH AND CORONARY/GRAFT ANGIOGRAPHY;  Surgeon: Yvonne Kendall, MD;  Location: [facility];  Service: Cardiovascular;  Laterality: Bilateral;   STONE EXTRACTION WITH BASKET Right 07/11/2019   Procedure: STONE EXTRACTION WITH BASKET;  Surgeon: Sondra Come, MD;  Location: [facility];  Service: Urology;  Laterality: Right;   TEE WITHOUT CARDIOVERSION N/A 04/21/2019   Procedure: TRANSESOPHAGEAL ECHOCARDIOGRAM (TEE);  Surgeon: Antonieta Iba, MD;  Location: [facility];  Service: Cardiovascular;  Laterality: N/A;   TEE WITHOUT CARDIOVERSION N/A 07/04/2019   Procedure: TRANSESOPHAGEAL ECHOCARDIOGRAM (TEE);  Surgeon: Iran Ouch, MD;  Location: [facility];  Service: Cardiovascular;  Laterality: N/A;   TEE WITHOUT CARDIOVERSION N/A 08/17/2019   Procedure: TRANSESOPHAGEAL ECHOCARDIOGRAM (TEE);  Surgeon: Debbe Odea, MD;  Location: [facility];  Service: Cardiovascular;  Laterality: N/A;   TEE WITHOUT CARDIOVERSION N/A 11/15/2019   Procedure: TRANSESOPHAGEAL ECHOCARDIOGRAM (TEE);  Surgeon: Yvonne Kendall, MD;  Location: [facility];  Service: Cardiovascular;  Laterality: N/A;   TONSILLECTOMY       Social History:   reports that he quit smoking about 54 years ago. His smoking use included cigarettes. He has never used smokeless tobacco. He reports that he does not drink alcohol and does not use drugs.   Family History:  His family history includes Heart attack in his sister; Heart attack (age of onset: 31) in his father.   Allergies Allergies  Allergen Reactions   Metformin Diarrhea    Loose stools even with XR     Home Medications  Prior to  Admission medications   Medication Sig Start Date End Date Taking? Authorizing Provider  amiodarone (PACERONE) 200 MG tablet Take 1 tablet by mouth once daily Patient taking differently: Take 200 mg by mouth daily. 07/16/22  Yes End, Cristal Deer, MD  apixaban (ELIQUIS) 5 MG TABS tablet Take 5 mg by mouth 2 (two) times daily.   Yes [provider]  aspirin EC 81 MG tablet Take 81 mg by mouth daily. Swallow whole.   Yes [provider]  atorvastatin (LIPITOR) 80 MG tablet Take 1 tablet (80 mg total) by mouth every evening. 11/01/21  Yes Creig Hines, NP  furosemide (LASIX) 40 MG tablet Take 1 tablet (40 mg total) by mouth 2 (two) times daily. 06/23/22 06/23/23 Yes End, Cristal Deer, MD  insulin NPH-regular Human (70-30) 100 UNIT/ML injection Inject 130 Units into the skin 2 (two) times daily.   Yes [provider]  Magnesium Oxide 400 MG CAPS Take 1 capsule (400 mg total) by mouth daily. 11/01/21  Yes Creig Hines, NP  nitroGLYCERIN (NITROSTAT) 0.4 MG SL tablet Place 1 tablet (0.4 mg total) under the tongue every 5 (five) minutes x 3 doses as needed for chest pain. 08/12/22  Yes Sreenath, Sudheer B, MD  omeprazole (PRILOSEC) 20 MG capsule Take 40 mg by mouth daily. 07/25/22  Yes [provider]  ranolazine (RANEXA) 500 MG 12 hr tablet Take 1 tablet (500 mg total) by mouth 2 (two) times daily. 04/03/22  Yes Furth, Cadence H, PA-C  tamsulosin (FLOMAX) 0.4 MG CAPS capsule Take 0.4 mg by mouth daily. 03/06/21  Yes [provider]  vitamin B-12 (CYANOCOBALAMIN) 500 MCG tablet Take 1 tablet (500 mcg total) by mouth daily. 10/21/22  Yes [name], MD  isosorbide mononitrate (IMDUR) 30 MG 24 hr tablet Take 0.5 tablets (15 mg total) by mouth daily. Patient not taking: Reported on 11/06/2022 11/01/21   Creig Hines, NP  loratadine (CLARITIN) 10 MG  tablet Take 10 mg by mouth daily. Patient not taking: Reported on 11/06/2022 09/13/18   [provider]  meclizine (ANTIVERT) 25 MG tablet Take 1 tablet (25 mg total) by mouth 3 (three) times daily as needed for dizziness. Patient not taking: Reported on 12/08/2022 09/13/21   Burnadette Pop, MD  methocarbamol (ROBAXIN) 500 MG tablet Take 1 tablet (500 mg total) by mouth every 8 (eight) hours as needed for muscle spasms. Patient not taking: Reported on 12/08/2022 08/12/22   Tresa Moore, MD  metoprolol succinate (TOPROL-XL) 25 MG 24 hr tablet Take 25 mg by mouth 2 (two) times daily. Patient not taking: Reported on 12/09/2022    [provider]      I spent 65 minutes caring for this patient today, including preparing to see the patient, obtaining a medical history , reviewing a separately obtained history, performing a medically appropriate examination and/or evaluation, counseling and educating the patient/family/caregiver, referring and communicating with other health care professionals (not separately reported), and documenting clinical information in the electronic health record

## 2022-12-11 NOTE — Progress Notes (Signed)
PROGRESS NOTE    Jonathan Cross Jonathan Cross.  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  Upon admission CT head, CT chest abdomen pelvis does not show any acute pathology.  Echocardiogram showed preserved EF with grade 1 DD.  At this time opting for medical management but may end up needing LHC/RHC.   Assessment & Plan:  Principal Problem:   Chest pain, undetermined etiology Active Problems:   Coronary artery disease of native artery of native heart with stable angina pectoris (HCC)   Status post coronary artery bypass graft   Paroxysmal atrial fibrillation/A-flutter (HCC)   AKI (acute kidney injury) (HCC)   Uncontrolled type 2 diabetes mellitus with hyperglycemia, with long-term current use of insulin (HCC)   Essential hypertension   S/P aortic valve replacement   Chronic heart failure with preserved ejection fraction (HFpEF) (HCC)   Myelodysplastic syndrome (HCC)      * Chest pain, undetermined etiology History of CAD s/p CABG with patent grafts 06/2022, history of MDS, history of endocarditis Troponins are flat, EKG is nonischemic.  Echocardiogram shows EF of 65% with grade 1 DD.  Further plans per cardiology.  Tentative plans for [facility]  Coronary artery disease of native artery of native heart with stable angina pectoris (HCC) History of CABG Possible stable , less likely unstable angina Patient is now on aspirin, statin, Toprol-XL 25 mg daily and Ranexa.  Imdur added  Paroxysmal atrial fibrillation/A-flutter (HCC) A flutter On EKG but Rate Controlled -Continue amiodarone and Eliquis  AKI (acute kidney injury)  (HCC) Hyponatremia Baseline creatinine 1.0, currently stable  Uncontrolled type 2 diabetes mellitus with hyperglycemia, with long-term current use of insulin (HCC) Semglee 10 units daily Sliding scale and Accu-Cheks  Myelodysplastic syndrome (HCC) Macrocytic anemia No acute issues suspected Stable, follows outpatient.    Chronic heart failure with preserved ejection fraction (HFpEF) (HCC) Compensated Meds as above  S/P aortic valve replacement History of endocarditis post aortic valve replacement No acute issues suspected   Essential hypertension Continue metoprolol  PT/OT= no follow up  DVT prophylaxis:  Code Status: Full Family Communication:    Ongoing management by cardiology for chest pain with medication optimization Dc when cleared by cardiology.    Pressure Injury 12/09/22 Buttocks Right;Left;Medial Stage 1 -  Intact skin with non-blanchable redness of a localized area usually over a bony prominence. pt had stage 2 bed sores. now scabbed over. (Active)  12/09/22 0739  Location: Buttocks  Location Orientation: Right;Left;Medial  Staging: Stage 1 -  Intact skin with non-blanchable redness of a localized area usually over a bony prominence. (no open wound found on assessment)  Wound Description (Comments): pt had stage 2 bed sores. now scabbed over.  Present on Admission: Yes     Diet Orders (From admission, onward)     Start     Ordered   12/11/22 0001  Diet NPO time specified Except for: Sips with Meds  Diet effective midnight       Question:  Except for  Answer:  Clearance Coots with Meds   12/10/22 2222            Subjective: During my visit patient tells me he has chest pain earlier but no complaints at  the moment.  Examination:  Constitutional: Not in acute distress Respiratory: Clear to auscultation bilaterally Cardiovascular: Normal sinus rhythm, no rubs Abdomen: Nontender nondistended good bowel sounds Musculoskeletal: No edema noted Skin: No  rashes seen Neurologic: CN 2-12 grossly intact.  And nonfocal Psychiatric: Normal judgment and insight. Alert and oriented x 3. Normal mood.  Objective: Vitals:   12/10/22 1641 12/10/22 2006 12/10/22 2300 12/11/22 0426  BP: 122/62 126/61 124/65 138/73  Pulse: 66 70 70 69  Resp: 18 18 18 18   Temp: (!) 97.5 F (36.4 C) 97.8 F (36.6 C) 97.8 F (36.6 C) 97.8 F (36.6 C)  TempSrc:  Oral Oral Oral  SpO2: 98% 100% 100% 99%  Weight:      Height:        Intake/Output Summary (Last 24 hours) at 12/11/2022 0754 Last data filed at 12/10/2022 1700 Gross per 24 hour  Intake 240 ml  Output 450 ml  Net -210 ml   Filed Weights   12/08/22 1439 12/09/22 1940 12/10/22 0534  Weight: 118 kg 117.2 kg 116.7 kg    Scheduled Meds:  amiodarone  200 mg Oral Daily   aspirin EC  81 mg Oral Daily   atorvastatin  80 mg Oral QPM   cyanocobalamin  500 mcg Oral Daily   insulin aspart  0-20 Units Subcutaneous TID WC   insulin aspart  0-5 Units Subcutaneous QHS   insulin aspart  3 Units Subcutaneous TID WC   insulin glargine-yfgn  10 Units Subcutaneous Daily   isosorbide mononitrate  15 mg Oral Daily   metoprolol succinate  25 mg Oral Daily   pantoprazole  40 mg Oral Daily   ranolazine  500 mg Oral BID   tamsulosin  0.4 mg Oral Daily   Continuous Infusions:  Nutritional status     Body mass index is 37.99 kg/m.  Data Reviewed:   CBC: Recent Labs  Lab 12/08/22 1440 12/09/22 0951 12/10/22 0347 12/11/22 0330  WBC 2.8* 2.8* 3.4* 4.4  NEUTROABS 1.1*  --   --   --   HGB 9.9* 10.4* 11.1* 10.3*  HCT 30.7* 32.7* 33.8* 32.1*  MCV 114.6* 114.7* 113.4* 112.6*  PLT 133* 140* 144* 129*   Basic Metabolic Panel: Recent Labs  Lab 12/08/22 1440 12/09/22 1223 12/10/22 0347 12/11/22 0330  NA 132* 134* 135 132*  K 4.0 3.8 4.0 3.8  CL 97* 101 102 100  CO2 24 26 24 27   GLUCOSE 350* 239* 210* 224*  BUN 21 17 20 23   CREATININE 1.39* 1.07 1.19 1.19  CALCIUM 8.6* 8.5* 8.7* 8.4*  MG  --  2.1 2.2  2.2   GFR: Estimated Creatinine Clearance: 63.4 mL/min (by C-G formula based on SCr of 1.19 mg/dL). Liver Function Tests: Recent Labs  Lab 12/08/22 1440  AST 27  ALT 27  ALKPHOS 51  BILITOT 0.8  PROT 7.3  ALBUMIN 3.7   No results for input(s): "LIPASE", "AMYLASE" in the last 168 hours. No results for input(s): "AMMONIA" in the last 168 hours. Coagulation Profile: No results for input(s): "INR", "PROTIME" in the last 168 hours. Cardiac Enzymes: No results for input(s): "CKTOTAL", "CKMB", "CKMBINDEX", "TROPONINI" in the last 168 hours. BNP (last 3 results) No results for input(s): "PROBNP" in the last 8760 hours. HbA1C: Recent Labs    12/08/22 2338  HGBA1C 8.1*   CBG: Recent Labs  Lab 12/09/22 2206 12/10/22 0814 12/10/22 1159 12/10/22 1644 12/10/22 2152  GLUCAP 183* 208* 269* 228* 319*   Lipid Profile: No  results for input(s): "CHOL", "HDL", "LDLCALC", "TRIG", "CHOLHDL", "LDLDIRECT" in the last 72 hours. Thyroid Function Tests: No results for input(s): "TSH", "T4TOTAL", "FREET4", "T3FREE", "THYROIDAB" in the last 72 hours. Anemia Panel: No results for input(s): "VITAMINB12", "FOLATE", "FERRITIN", "TIBC", "IRON", "RETICCTPCT" in the last 72 hours. Sepsis Labs: No results for input(s): "PROCALCITON", "LATICACIDVEN" in the last 168 hours.  Recent Results (from the past 240 hour(s))  Culture, blood (Routine X 2) w Reflex to ID Panel     Status: None (Preliminary result)   Collection Time: 12/08/22 11:38 PM   Specimen: BLOOD  Result Value Ref Range Status   Specimen Description BLOOD BLOOD RIGHT ARM  Final   Special Requests   Final    BOTTLES DRAWN AEROBIC AND ANAEROBIC Blood Culture adequate volume   Culture   Final    NO GROWTH 2 DAYS Performed at [facility], [address], [place], [place] [address]    Report Status PENDING  Incomplete  Culture, blood (Routine X 2) w Reflex to ID Panel     Status: None (Preliminary result)   Collection Time:  12/08/22 11:38 PM   Specimen: BLOOD  Result Value Ref Range Status   Specimen Description BLOOD BLOOD LEFT ARM  Final   Special Requests   Final    BOTTLES DRAWN AEROBIC AND ANAEROBIC Blood Culture adequate volume   Culture   Final    NO GROWTH 2 DAYS Performed at [facility], [address] [place], [place] [address]    Report Status PENDING  Incomplete         Radiology Studies: ECHOCARDIOGRAM COMPLETE  Result Date: 12/09/2022    ECHOCARDIOGRAM REPORT   Patient Name:   Toi Dovell [facility]. Date of Exam: 12/09/2022 Medical Rec #:  [id number]              Height:       69.0 in Accession #:    [id number]             Weight:       260.1 lb Date of Birth:  Jun 15, 1946              BSA:          2.310 m Patient Age:    77 years               BP:           151/70 mmHg Patient Gender: M                      HR:           78 bpm. Exam Location:  [facility] Procedure: 2D Echo, Cardiac Doppler and Color Doppler Indications:     Chest pain R07.9  History:         Patient has prior history of Echocardiogram examinations, most                  recent 05/02/2022. Risk Factors:Hypertension. Aortic valve                  replacement.                  Aortic Valve: 23 mm bovine valve is present in the aortic                  position.  Sonographer:     Cristela Blue Referring Phys:  [id number] Andris Baumann Diagnosing Phys: Cristal Deer End  MD  Sonographer Comments: Suboptimal apical window. IMPRESSIONS  1. Left ventricular ejection fraction, by estimation, is 60 to 65%. The left ventricle has normal function. Left ventricular endocardial border not optimally defined to evaluate regional wall motion. There is mild left ventricular hypertrophy. Left ventricular diastolic parameters are consistent with Grade I diastolic dysfunction (impaired relaxation).  2. Right ventricular systolic function is mildly reduced. The right ventricular size is normal. Tricuspid regurgitation signal is inadequate for assessing PA  pressure.  3. The mitral valve is abnormal. Mild mitral valve regurgitation. No evidence of mitral stenosis.  4. The aortic valve was not well visualized. Aortic valve regurgitation is not visualized. No aortic stenosis is present. There is a 23 mm bovine valve present in the aortic position. FINDINGS  Left Ventricle: Left ventricular ejection fraction, by estimation, is 60 to 65%. The left ventricle has normal function. Left ventricular endocardial border not optimally defined to evaluate regional wall motion. The left ventricular internal cavity size was normal in size. There is mild left ventricular hypertrophy. Left ventricular diastolic parameters are consistent with Grade I diastolic dysfunction (impaired relaxation). Right Ventricle: The right ventricular size is normal. No increase in right ventricular wall thickness. Right ventricular systolic function is mildly reduced. Tricuspid regurgitation signal is inadequate for assessing PA pressure. Left Atrium: Left atrial size was normal in size. Right Atrium: Right atrial size was normal in size. Pericardium: The pericardium was not well visualized. Mitral Valve: The mitral valve is abnormal. There is mild thickening of the mitral valve leaflet(s). There is mild calcification of the mitral valve leaflet(s). Mild mitral valve regurgitation. No evidence of mitral valve stenosis. MV peak gradient, 6.5 mmHg. The mean mitral valve gradient is 3.0 mmHg. Tricuspid Valve: The tricuspid valve is not well visualized. Tricuspid valve regurgitation is trivial. Aortic Valve: The aortic valve was not well visualized. Aortic valve regurgitation is not visualized. No aortic stenosis is present. Aortic valve mean gradient measures 5.0 mmHg. Aortic valve peak gradient measures 8.8 mmHg. Aortic valve area, by VTI measures 1.70 cm. There is a 23 mm bovine valve present in the aortic position. Pulmonic Valve: The pulmonic valve was not well visualized. Pulmonic valve regurgitation  is not visualized. No evidence of pulmonic stenosis. Aorta: The aortic root is normal in size and structure. Pulmonary Artery: The pulmonary artery is not well seen. Venous: The inferior vena cava was not well visualized. IAS/Shunts: The interatrial septum was not well visualized.  LEFT VENTRICLE PLAX 2D LVIDd:         3.90 cm   Diastology LVIDs:         2.80 cm   LV e' medial:    4.13 cm/s LV PW:         1.20 cm   LV E/e' medial:  21.1 LV IVS:        1.25 cm   LV e' lateral:   11.60 cm/s LVOT diam:     2.00 cm   LV E/e' lateral: 7.5 LV SV:         50 LV SV Index:   22 LVOT Area:     3.14 cm  RIGHT VENTRICLE RV Basal diam:  3.00 cm RV Mid diam:    2.80 cm RV S prime:     7.83 cm/s TAPSE (M-mode): 1.7 cm LEFT ATRIUM             Index        RIGHT ATRIUM  Index LA diam:        3.70 cm 1.60 cm/m   RA Area:     11.84 cm LA Vol (A2C):   30.2 ml 13.07 ml/m  RA Volume:   24.94 ml  10.79 ml/m LA Vol (A4C):   31.2 ml 13.51 ml/m LA Biplane Vol: 32.3 ml 13.98 ml/m  AORTIC VALVE AV Area (Vmax):    1.71 cm AV Area (Vmean):   1.82 cm AV Area (VTI):     1.70 cm AV Vmax:           148.00 cm/s AV Vmean:          100.925 cm/s AV VTI:            0.296 m AV Peak Grad:      8.8 mmHg AV Mean Grad:      5.0 mmHg LVOT Vmax:         80.60 cm/s LVOT Vmean:        58.400 cm/s LVOT VTI:          0.160 m LVOT/AV VTI ratio: 0.54  AORTA Ao Root diam: 3.00 cm MITRAL VALVE MV Area (PHT): 3.00 cm     SHUNTS MV Area VTI:   1.67 cm     Systemic VTI:  0.16 m MV Peak grad:  6.5 mmHg     Systemic Diam: 2.00 cm MV Mean grad:  3.0 mmHg MV Vmax:       1.27 m/s MV Vmean:      75.7 cm/s MV Decel Time: 253 msec MV E velocity: 87.00 cm/s MV A velocity: 119.00 cm/s MV E/A ratio:  0.73 Cristal Deer End MD Electronically signed by Yvonne Kendall MD Signature Date/Time: 12/09/2022/12:47:54 PM    Final            LOS: 0 days   Time spent= 35 mins    Christee Mervine Joline Maxcy, MD [facility]  If 7PM-7AM, please contact  night-coverage  12/11/2022, 7:54 AM

## 2022-12-11 NOTE — Progress Notes (Signed)
Pt refused bed alarm and mats at this time but was educated about its importance. Will continue to monitor.

## 2022-12-11 NOTE — Plan of Care (Signed)
  Problem: Activity: Goal: Ability to tolerate increased activity will improve Outcome: Progressing   Problem: Clinical Measurements: Goal: Respiratory complications will improve Outcome: Progressing   Problem: Clinical Measurements: Goal: Cardiovascular complication will be avoided Outcome: Progressing   Problem: Pain Managment: Goal: General experience of comfort will improve Outcome: Progressing   Problem: Safety: Goal: Ability to remain free from injury will improve Outcome: Progressing   

## 2022-12-11 NOTE — Progress Notes (Signed)
Rounding Note    Patient Name: Jonathan Cross. Date of Encounter: 12/11/2022  [facility] Cardiologist: Yvonne Kendall, MD   Subjective   He reports improved chest pain, 1/10. Overall he is feeling better, but has chronic sob and chest pain.   Inpatient Medications    Scheduled Meds:  amiodarone  200 mg Oral Daily   aspirin EC  81 mg Oral Daily   atorvastatin  80 mg Oral QPM   cyanocobalamin  500 mcg Oral Daily   insulin aspart  0-20 Units Subcutaneous TID WC   insulin aspart  0-5 Units Subcutaneous QHS   insulin aspart  3 Units Subcutaneous TID WC   insulin glargine-yfgn  10 Units Subcutaneous Daily   isosorbide mononitrate  15 mg Oral Daily   metoprolol succinate  25 mg Oral Daily   pantoprazole  40 mg Oral Daily   ranolazine  500 mg Oral BID   tamsulosin  0.4 mg Oral Daily   Continuous Infusions:  PRN Meds: acetaminophen, nitroGLYCERIN, ondansetron (ZOFRAN) IV   Vital Signs    Vitals:   12/10/22 1641 12/10/22 2006 12/10/22 2300 12/11/22 0426  BP: 122/62 126/61 124/65 138/73  Pulse: 66 70 70 69  Resp: 18 18 18 18   Temp: (!) 97.5 F (36.4 C) 97.8 F (36.6 C) 97.8 F (36.6 C) 97.8 F (36.6 C)  TempSrc:  Oral Oral Oral  SpO2: 98% 100% 100% 99%  Weight:      Height:        Intake/Output Summary (Last 24 hours) at 12/11/2022 0855 Last data filed at 12/11/2022 0800 Gross per 24 hour  Intake 240 ml  Output 850 ml  Net -610 ml      12/10/2022    5:34 AM 12/09/2022    7:40 PM 12/08/2022    2:39 PM  Last 3 Weights  Weight (lbs) 257 lb 4.4 oz 258 lb 6.1 oz 260 lb 2.3 oz  Weight (kg) 116.7 kg 117.2 kg 118 kg      Telemetry    NSR HR 60s - Personally Reviewed  ECG     No new - Personally Reviewed  Physical Exam   GEN: No acute distress.   Neck: No JVD Cardiac: RRR, no murmurs, rubs, or gallops.  Respiratory: Clear to auscultation bilaterally. GI: Soft, nontender, non-distended  MS: No edema; No deformity. Neuro:  Nonfocal   Psych: Normal affect   Labs    High Sensitivity Troponin:   Recent Labs  Lab 12/08/22 1440 12/08/22 1811  TROPONINIHS 12  12 12      Chemistry Recent Labs  Lab 12/08/22 1440 12/09/22 1223 12/10/22 0347 12/11/22 0330  NA 132* 134* 135 132*  K 4.0 3.8 4.0 3.8  CL 97* 101 102 100  CO2 24 26 24 27   GLUCOSE 350* 239* 210* 224*  BUN 21 17 20 23   CREATININE 1.39* 1.07 1.19 1.19  CALCIUM 8.6* 8.5* 8.7* 8.4*  MG  --  2.1 2.2 2.2  PROT 7.3  --   --   --   ALBUMIN 3.7  --   --   --   AST 27  --   --   --   ALT 27  --   --   --   ALKPHOS 51  --   --   --   BILITOT 0.8  --   --   --   GFRNONAA 52* >60 >60 >60  ANIONGAP 11 7 9 5     Lipids  No results for input(s): "CHOL", "TRIG", "HDL", "LABVLDL", "LDLCALC", "CHOLHDL" in the last 168 hours.  Hematology Recent Labs  Lab 12/09/22 0951 12/10/22 0347 12/11/22 0330  WBC 2.8* 3.4* 4.4  RBC 2.85* 2.98* 2.85*  HGB 10.4* 11.1* 10.3*  HCT 32.7* 33.8* 32.1*  MCV 114.7* 113.4* 112.6*  MCH 36.5* 37.2* 36.1*  MCHC 31.8 32.8 32.1  RDW 15.3 15.3 15.0  PLT 140* 144* 129*   Thyroid No results for input(s): "TSH", "FREET4" in the last 168 hours.  BNP Recent Labs  Lab 12/08/22 1540  BNP 39.2    DDimer No results for input(s): "DDIMER" in the last 168 hours.   Radiology    No results found.  Cardiac Studies   Echo 12/09/22 1. Left ventricular ejection fraction, by estimation, is 60 to 65%. The  left ventricle has normal function. Left ventricular endocardial border  not optimally defined to evaluate regional wall motion. There is mild left  ventricular hypertrophy. Left  ventricular diastolic parameters are consistent with Grade I diastolic  dysfunction (impaired relaxation).   2. Right ventricular systolic function is mildly reduced. The right  ventricular size is normal. Tricuspid regurgitation signal is inadequate  for assessing PA pressure.   3. The mitral valve is abnormal. Mild mitral valve regurgitation. No   evidence of mitral stenosis.   4. The aortic valve was not well visualized. Aortic valve regurgitation  is not visualized. No aortic stenosis is present. There is a 23 mm bovine  valve present in the aortic position.    R/LHC 06/17/2022: Conclusions: Significant multivessel coronary artery disease, as detailed below, including 80% ostial LAD disease followed by sequential 50-60% mid LAD lesions, 50% proximal LCx disease, and 70-80% lesion in proximal segment of rPL1. Widely patent LIMA-LAD and SVG-OM2. Moderately elevated left heart filling pressures. Mildly elevated right heart and pulmonary artery pressures. Normal cardiac output/index.   Recommendations: Escalate diuresis with repeat BMP at follow-up visit next week. Secondary prevention of coronary artery disease and continued antianginal therapy. 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 __________   2D echo 05/02/2022: 1. Left ventricular ejection fraction, by estimation, is 60 to 65%. The  left ventricle has normal function. The left ventricle has no regional  wall motion abnormalities. Left ventricular diastolic parameters are  consistent with Grade II diastolic  dysfunction (pseudonormalization).   2. Right ventricular systolic function is normal. The right ventricular  size is normal. Tricuspid regurgitation signal is inadequate for assessing  PA pressure.   3. Left atrial size was mildly dilated.   4. The mitral valve is normal in structure. Mild mitral valve  regurgitation. No evidence of mitral stenosis.   5. The aortic valve has been repaired/replaced. Aortic valve  regurgitation is not visualized. No aortic stenosis is present. Aortic  valve area, by VTI measures 2.85 cm. Aortic valve mean gradient measures  15.0 mmHg. s/p AVR bovine in 2017   6. The inferior vena cava is normal in size with greater than 50%   respiratory variability, suggesting right atrial pressure of 3 mmHg.   Patient Profile     77 y.o. 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   Assessment & Plan  Chest pain CAD status post CABG in 2017 - presented with atypical chest pain. He has chronic generalized fatigue, SOB and chest pain in the setting of CAD with underlying MDS - HS trop negative x 2 - EKG nonischemic - echo showed LVEF 60-65%, mild LVH, G1DD, mild MR - LHC 06/2022 showed patent grafts with no indication for intervention, with residual disease - given more atypical chest pain in the setting of MDS and negative troponin, do not suspect we will pursue further ischemic work-up. - continue ASA, Lipitor, Toprol, Imdur, Toprol and Ranexa - he was restarted on Imdur 15mg  daily - chest pain is overall better, but still present. Discussed medical management vs cath. Will discuss with MD.   Status post AVR with h/o endocarditis - echo showed normal LVEF, G1DD, aortic valve was not well visualized, mean gradient   PAF - h/o Afib -  in NSR - continue Eliquis 5mg  BID and amiodarone 200mg  daily - CHADSVASC at least 6 - continue toprol   HFpEF - patient appears euvolemic on exam - PTA lasix 20mg  BID - s/p lasix 20mg  IV    MDS - Hgb was 11-12 - per hem/onc   Hyperlipidemia - continue PTA Lipitor   Hypertension - Bps mildly elevated  For questions or updates, please contact [facility] Please consult [email or web address] for contact info under        Signed, Jalie Eiland David Stall, PA-C  12/11/2022, 8:55 AM

## 2022-12-12 ENCOUNTER — Ambulatory Visit: Payer: Medicare HMO | Admitting: Cardiology

## 2022-12-12 DIAGNOSIS — R079 Chest pain, unspecified: Secondary | ICD-10-CM

## 2022-12-12 DIAGNOSIS — I200 Unstable angina: Secondary | ICD-10-CM | POA: Diagnosis not present

## 2022-12-12 LAB — CBC
HCT: 30.8 % — ABNORMAL LOW (ref 39.0–52.0)
Hemoglobin: 10.2 g/dL — ABNORMAL LOW (ref 13.0–17.0)
MCH: 37 pg — ABNORMAL HIGH (ref 26.0–34.0)
MCHC: 33.1 g/dL (ref 30.0–36.0)
MCV: 111.6 fL — ABNORMAL HIGH (ref 80.0–100.0)
Platelets: 137 10*3/uL — ABNORMAL LOW (ref 150–400)
RBC: 2.76 MIL/uL — ABNORMAL LOW (ref 4.22–5.81)
RDW: 15.1 % (ref 11.5–15.5)
WBC: 3.6 10*3/uL — ABNORMAL LOW (ref 4.0–10.5)
nRBC: 0 % (ref 0.0–0.2)

## 2022-12-12 LAB — BASIC METABOLIC PANEL
Anion gap: 9 (ref 5–15)
BUN: 23 mg/dL (ref 8–23)
CO2: 21 mmol/L — ABNORMAL LOW (ref 22–32)
Calcium: 8.4 mg/dL — ABNORMAL LOW (ref 8.9–10.3)
Chloride: 102 mmol/L (ref 98–111)
Creatinine, Ser: 1.22 mg/dL (ref 0.61–1.24)
GFR, Estimated: >60 mL/min (ref >60)
Glucose, Bld: 173 mg/dL — ABNORMAL HIGH (ref 70–99)
Potassium: 4.3 mmol/L (ref 3.5–5.1)
Sodium: 132 mmol/L — ABNORMAL LOW (ref 135–145)

## 2022-12-12 LAB — CULTURE, BLOOD (ROUTINE X 2)

## 2022-12-12 LAB — GLUCOSE, CAPILLARY
Glucose-Capillary: 202 mg/dL — ABNORMAL HIGH (ref 70–99)
Glucose-Capillary: 313 mg/dL — ABNORMAL HIGH (ref 70–99)
Glucose-Capillary: 193 mg/dL — ABNORMAL HIGH (ref 70–99)
Glucose-Capillary: 282 mg/dL — ABNORMAL HIGH (ref 70–99)

## 2022-12-12 LAB — LIPOPROTEIN A (LPA): Lipoprotein (a): 27.1 nmol/L (ref <75.0)

## 2022-12-12 LAB — MAGNESIUM: Magnesium: 2.3 mg/dL (ref 1.7–2.4)

## 2022-12-12 MED ORDER — INSULIN ASPART 100 UNIT/ML IJ SOLN: 3.0000 [IU] | Freq: Once | INTRAMUSCULAR | Status: DC

## 2022-12-12 MED ORDER — ORAL CARE MOUTH RINSE: 15.0000 mL | OROMUCOSAL | Status: DC | PRN

## 2022-12-12 MED ORDER — INSULIN ASPART 100 UNIT/ML IJ SOLN
6.0000 [IU] | Freq: Three times a day (TID) | INTRAMUSCULAR | Status: DC
  Administered 2022-12-12 – 2022-12-14 (×6): 6 [IU] via SUBCUTANEOUS
  Filled 2022-12-12 (×6): qty 1

## 2022-12-12 MED ORDER — INSULIN GLARGINE-YFGN 100 UNIT/ML ~~LOC~~ SOLN
2.0000 [IU] | Freq: Once | SUBCUTANEOUS | Status: AC
  Administered 2022-12-12: 2 [IU] via SUBCUTANEOUS
  Filled 2022-12-12: qty 0.02

## 2022-12-12 MED ORDER — INSULIN GLARGINE-YFGN 100 UNIT/ML ~~LOC~~ SOLN
12.0000 [IU] | Freq: Every day | SUBCUTANEOUS | Status: DC
  Administered 2022-12-13: 12 [IU] via SUBCUTANEOUS
  Filled 2022-12-12 (×2): qty 0.12

## 2022-12-12 NOTE — Progress Notes (Signed)
PROGRESS NOTE    Jonathan Carnes Jonathan Cross.  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  Upon admission CT head, CT chest abdomen pelvis does not show any acute pathology.  Echocardiogram showed preserved EF with grade 1 DD.  At this time opting for medical management but may end up needing LHC/RHC.  Patient also seen by pulmonary who strongly recommends weight loss otherwise outpatient PFTs.   Assessment & Plan:  Principal Problem:   Chest pain, undetermined etiology Active Problems:   Coronary artery disease of native artery of native heart with stable angina pectoris (HCC)   Status post coronary artery bypass graft   Paroxysmal atrial fibrillation/A-flutter (HCC)   AKI (acute kidney injury) (HCC)   Uncontrolled type 2 diabetes mellitus with hyperglycemia, with long-term current use of insulin (HCC)   Essential hypertension   S/P aortic valve replacement   Chronic heart failure with preserved ejection fraction (HFpEF) (HCC)   Myelodysplastic syndrome (HCC)      * Chest pain, undetermined etiology History of CAD s/p CABG with patent grafts 06/2022, history of MDS, history of endocarditis Troponins are flat, EKG is nonischemic.  Echocardiogram shows EF of 65% with grade 1 DD.  Further plans for cardiology in terms of catheterization.  Coronary artery disease of native artery of native heart with stable angina pectoris (HCC) History of CABG Possible stable , less likely unstable angina Patient is now on aspirin, statin, Toprol-XL 25 mg daily and Ranexa.  Imdur added.  Adjust medications as necessary.  Paroxysmal atrial fibrillation/A-flutter  (HCC) A flutter On EKG but Rate Controlled -Continue amiodarone and Eliquis  AKI (acute kidney injury) (HCC) Hyponatremia Baseline creatinine 1.0, currently stable  Acute respiratory distress - Intermittent.  Seen by pulmonary.  Suspect from underlying multiple issues including cardiac, obesity.  Recommending outpatient PFTs when he is more clinically stable and weight loss  Uncontrolled type 2 diabetes mellitus with hyperglycemia, with long-term current use of insulin (HCC) Long-acting insulin and Premeal.  Adjust as necessary Sliding scale and Accu-Cheks  Myelodysplastic syndrome (HCC) Macrocytic anemia No acute issues suspected Stable, follows outpatient.   Chronic heart failure with preserved ejection fraction (HFpEF) (HCC) Compensated Meds as above  S/P aortic valve replacement History of endocarditis post aortic valve replacement No acute issues suspected  Essential hypertension Continue metoprolol  PT/OT= no follow up  DVT prophylaxis: apixaban (ELIQUIS) tablet 5 mg  Code Status: Full Family Communication:    Ongoing management by cardiology for chest pain with medication optimization Dc when cleared by cardiology.    Pressure Injury 12/09/22 Buttocks Right;Left;Medial Stage 1 -  Intact skin with non-blanchable redness of a localized area usually over a bony prominence. pt had stage 2 bed sores. now scabbed over. (Active)  12/09/22 0739  Location: Buttocks  Location Orientation: Right;Left;Medial  Staging: Stage 1 -  Intact skin with non-blanchable redness of a localized area usually over a bony prominence. (no open wound found on assessment)  Wound Description (Comments): pt had stage 2 bed sores. now scabbed over.  Present on Admission: Yes     Diet Orders (From admission, onward)     Start     Ordered   12/11/22 1057  Diet heart healthy/carb  modified Room service appropriate? Yes; Fluid consistency: Thin  Diet effective now       Question Answer  Comment  Diet-HS Snack? Nothing   Room service appropriate? Yes   Fluid consistency: Thin      12/11/22 1056            Subjective: Seen at bedside.  No complaints besides persistent mild chest discomfort and some dyspnea but overall better Examination: Constitutional: Not in acute distress Respiratory: Clear to auscultation bilaterally Cardiovascular: Normal sinus rhythm, no rubs Abdomen: Nontender nondistended good bowel sounds Musculoskeletal: No edema noted Skin: No rashes seen Neurologic: CN 2-12 grossly intact.  And nonfocal Psychiatric: Normal judgment and insight. Alert and oriented x 3. Normal mood.   Objective: Vitals:   12/11/22 1952 12/11/22 2344 12/12/22 0600 12/12/22 0809  BP: 102/68 121/64 (!) 132/57 (!) 134/57  Pulse: 74 72 68 72  Resp: 16 18 18 18   Temp: 98.5 F (36.9 C) 98.2 F (36.8 C) 97.9 F (36.6 C) 97.6 F (36.4 C)  TempSrc: Oral     SpO2: 99% 100% 100% 100%  Weight:   116 kg   Height:        Intake/Output Summary (Last 24 hours) at 12/12/2022 0850 Last data filed at 12/12/2022 0603 Gross per 24 hour  Intake 720 ml  Output 1640 ml  Net -920 ml   Filed Weights   12/09/22 1940 12/10/22 0534 12/12/22 0600  Weight: 117.2 kg 116.7 kg 116 kg    Scheduled Meds:  amiodarone  200 mg Oral Daily   apixaban  5 mg Oral BID   aspirin EC  81 mg Oral Daily   atorvastatin  80 mg Oral QPM   cyanocobalamin  500 mcg Oral Daily   insulin aspart  0-20 Units Subcutaneous TID WC   insulin aspart  0-5 Units Subcutaneous QHS   insulin aspart  6 Units Subcutaneous TID WC   insulin glargine-yfgn  12 Units Subcutaneous Daily   isosorbide mononitrate  30 mg Oral Daily   metoprolol succinate  25 mg Oral Daily   pantoprazole  40 mg Oral Daily   ranolazine  500 mg Oral BID   tamsulosin  0.4 mg Oral Daily   Continuous Infusions:  Nutritional status     Body mass index is 37.78 kg/m.  Data Reviewed:   CBC: Recent Labs  Lab 12/08/22 1440  12/09/22 0951 12/10/22 0347 12/11/22 0330 12/12/22 0620  WBC 2.8* 2.8* 3.4* 4.4 3.6*  NEUTROABS 1.1*  --   --   --   --   HGB 9.9* 10.4* 11.1* 10.3* 10.2*  HCT 30.7* 32.7* 33.8* 32.1* 30.8*  MCV 114.6* 114.7* 113.4* 112.6* 111.6*  PLT 133* 140* 144* 129* 137*   Basic Metabolic Panel: Recent Labs  Lab 12/08/22 1440 12/09/22 1223 12/10/22 0347 12/11/22 0330 12/12/22 0426  NA 132* 134* 135 132* 132*  K 4.0 3.8 4.0 3.8 4.3  CL 97* 101 102 100 102  CO2 24 26 24 27  21*  GLUCOSE 350* 239* 210* 224* 173*  BUN 21 17 20 23 23   CREATININE 1.39* 1.07 1.19 1.19 1.22  CALCIUM 8.6* 8.5* 8.7* 8.4* 8.4*  MG  --  2.1 2.2 2.2 2.3   GFR: Estimated Creatinine Clearance: 61.7 mL/min (by C-G formula based on SCr of 1.22 mg/dL). Liver Function Tests: Recent Labs  Lab 12/08/22 1440  AST 27  ALT 27  ALKPHOS 51  BILITOT 0.8  PROT 7.3  ALBUMIN 3.7   No results for  input(s): "LIPASE", "AMYLASE" in the last 168 hours. No results for input(s): "AMMONIA" in the last 168 hours. Coagulation Profile: No results for input(s): "INR", "PROTIME" in the last 168 hours. Cardiac Enzymes: No results for input(s): "CKTOTAL", "CKMB", "CKMBINDEX", "TROPONINI" in the last 168 hours. BNP (last 3 results) No results for input(s): "PROBNP" in the last 8760 hours. HbA1C: No results for input(s): "HGBA1C" in the last 72 hours.  CBG: Recent Labs  Lab 12/11/22 0908 12/11/22 1159 12/11/22 1628 12/11/22 2110 12/12/22 0811  GLUCAP 243* 231* 302* 256* 202*   Lipid Profile: No results for input(s): "CHOL", "HDL", "LDLCALC", "TRIG", "CHOLHDL", "LDLDIRECT" in the last 72 hours. Thyroid Function Tests: No results for input(s): "TSH", "T4TOTAL", "FREET4", "T3FREE", "THYROIDAB" in the last 72 hours. Anemia Panel: No results for input(s): "VITAMINB12", "FOLATE", "FERRITIN", "TIBC", "IRON", "RETICCTPCT" in the last 72 hours. Sepsis Labs: No results for input(s): "PROCALCITON", "LATICACIDVEN" in the last 168  hours.  Recent Results (from the past 240 hour(s))  Culture, blood (Routine X 2) w Reflex to ID Panel     Status: None (Preliminary result)   Collection Time: 12/08/22 11:38 PM   Specimen: BLOOD  Result Value Ref Range Status   Specimen Description BLOOD BLOOD RIGHT ARM  Final   Special Requests   Final    BOTTLES DRAWN AEROBIC AND ANAEROBIC Blood Culture adequate volume   Culture   Final    NO GROWTH 3 DAYS Performed at [facility], [address], [place], [place] [address]    Report Status PENDING  Incomplete  Culture, blood (Routine X 2) w Reflex to ID Panel     Status: None (Preliminary result)   Collection Time: 12/08/22 11:38 PM   Specimen: BLOOD  Result Value Ref Range Status   Specimen Description BLOOD BLOOD LEFT ARM  Final   Special Requests   Final    BOTTLES DRAWN AEROBIC AND ANAEROBIC Blood Culture adequate volume   Culture   Final    NO GROWTH 3 DAYS Performed at [facility], [address], [place], [place] [address]    Report Status PENDING  Incomplete         Radiology Studies: No results found.         LOS: 1 day   Time spent= 35 mins    Glenna Brunkow Joline Maxcy, MD [facility]  If 7PM-7AM, please contact night-coverage  12/12/2022, 8:50 AM

## 2022-12-12 NOTE — Progress Notes (Addendum)
Patient's daughter Lanora Manis at bedside, wanted to talk to the doctor about patient catheterization, MD made aware of, cardiology will be talking to her during the afternoon.

## 2022-12-12 NOTE — Progress Notes (Signed)
Physical Therapy Treatment Patient Details Name: Jonathan Cross. 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  Upon admission CT head, CT chest abdomen pelvis does not show any acute pathology.  MD assessment includes: chest pain, undetermined etiology, A-fib, and AKI.    PT Comments    Pt was pleasant and motivated to participate in therapy today. He is modified independent with all bed mobility tasks with heavy use of bed rails to move around. Pt was able to perform sit to stand with standby assistance as he required use of UE's on bed and HHA from therapist. Pt was able to ambulate 150 feet today using a RW with frequent standing rest breaks. Pt described getting SOB while walking but his vital response showed no significant changes; SpO2 and HR at rest were 97% and 73 bpm, SpO2 and HR after ambulation were 97% and 87 bpm. Pt was also able to perform some dynamic standing balance exercises at the end of the session. He continued to mention feeling fatigued but was able to get through the entire session with no major concerns. Pt will benefit from continued PT services upon discharge to safely address deficits listed in patient problem list for decreased caregiver assistance and eventual return to PLOF.   Recommendations for follow up therapy are one component of a multi-disciplinary discharge planning process, led by the attending physician.  Recommendations may be updated based on patient status, additional functional criteria and insurance authorization.  Follow Up Recommendations       Assistance Recommended at Discharge PRN  Patient can  return home with the following Assist for transportation;Help with stairs or ramp for entrance;Assistance with cooking/housework   Equipment Recommendations  None recommended by PT    Recommendations for Other Services       Precautions / Restrictions Precautions Precautions: Fall Restrictions Weight Bearing Restrictions: No     Mobility  Bed Mobility Overal bed mobility: Modified Independent             General bed mobility comments: supine>sit with HOB elevated, use of bed rails    Transfers Overall transfer level: Modified independent (Use of UE's but otherwise independent) Equipment used: Rolling walker (2 wheels) Transfers: Sit to/from Stand Sit to Stand: Modified independent (Device/Increase time)           General transfer comment: STS from EOB with RUE HHA and LUE pushing on bed rail    Ambulation/Gait Ambulation/Gait assistance: Supervision, Min guard Gait Distance (Feet): 150 Feet Assistive device: Rolling walker (2 wheels) Gait Pattern/deviations: Decreased stride length, Decreased step length - right, Decreased step length - left Gait velocity: decreased     General Gait Details: Pt able to ambulate with supervision/min guard assistance in hallway. Required frequent standing rest breaks throughout bout of ambulation.   Stairs             Wheelchair Mobility    Modified Rankin (Stroke Patients Only)       Balance Overall balance assessment: Mild deficits observed, not formally tested Sitting-balance support: No upper extremity supported Sitting balance-Leahy Scale: Good     Standing balance support: No upper extremity supported (Pt able to perform static standing balance exercises in  Romberg and semi-tandem stances with no UE support) Standing balance-Leahy Scale: Good                              Cognition Arousal/Alertness: Awake/alert Behavior During Therapy: WFL for tasks assessed/performed Overall Cognitive  Status: Within Functional Limits for tasks assessed                                          Exercises Other Exercises Other Exercises: Dynamic standing balance exercises with feet apart, Romberg, semi-tandem stances with reaching outside BOS.    General Comments        Pertinent Vitals/Pain Pain Assessment Pain Assessment: No/denies pain    Home Living                          Prior Function            PT Goals (current goals can now be found in the care plan section) Acute Rehab PT Goals Patient Stated Goal: feel better PT Goal Formulation: With patient Time For Goal Achievement: 12/23/22 Potential to Achieve Goals: Good Progress towards PT goals: Progressing toward goals    Frequency    Min 2X/week      PT Plan Current plan remains appropriate    Co-evaluation              AM-PAC PT "6 Clicks" Mobility   Outcome Measure  Help needed turning from your back to your side while in a flat bed without using bedrails?: None Help needed moving from lying on your back to sitting on the side of a flat bed without using bedrails?: None Help needed moving to and from a bed to a chair (including a wheelchair)?: A Little Help needed standing up from a chair using your arms (e.g., wheelchair or bedside chair)?: A Little Help needed to walk in hospital room?: A Little Help needed climbing 3-5 steps with a railing? : A Little 6 Click Score: 20    End of Session Equipment Utilized During Treatment: Gait belt Activity Tolerance: Patient tolerated treatment well;Patient limited by fatigue Patient left: in bed;with call bell/phone within reach;with bed alarm set   PT Visit Diagnosis: Difficulty in walking, not elsewhere classified (R26.2);Muscle weakness (generalized) (M62.81)     Time: 1610-9604 PT Time Calculation (min) (ACUTE ONLY): 19 min  Charges:  $Therapeutic Exercise: 8-22 mins                     Helen Winterhalter,  SPT    Adolpho Meenach 12/12/2022, 3:35 PM

## 2022-12-12 NOTE — Progress Notes (Signed)
Mobility Specialist - Progress Note    12/12/22 1049  Mobility  Activity Ambulated with assistance in room  Level of Assistance Contact guard assist, steadying assist  Assistive Device Front wheel walker  Distance Ambulated (ft) 10 ft  Range of Motion/Exercises Active  Activity Response Tolerated well  Mobility Referral Yes  $Mobility charge 1 Mobility  Mobility Specialist Start Time (ACUTE ONLY) 1018  Mobility Specialist Stop Time (ACUTE ONLY) 1049  Mobility Specialist Time Calculation (min) (ACUTE ONLY) 31 min   Pt resting in recliner on RA upon entry. Pt STS and ambulates to door in room CGA with RW. Pt returned to recliner. RN notified of blood present on recliner; RN performed pt examination and dressing applied. Pt left with needs in reach and chair alarm activated.   Johnathan Hausen Mobility Specialist 12/13/22, 10:38 AM

## 2022-12-12 NOTE — Inpatient Diabetes Management (Signed)
Inpatient Diabetes Program Recommendations  AACE/ADA: New Consensus Statement on Inpatient Glycemic Control (2015)  Target Ranges:  Prepandial:   less than 140 mg/dL      Peak postprandial:   less than 180 mg/dL (1-2 hours)      Critically ill patients:  140 - 180 mg/dL    Latest Reference Range & Units 12/11/22 08:01 12/11/22 09:08 12/11/22 11:59 12/11/22 16:28 12/11/22 21:10  Glucose-Capillary 70 - 99 mg/dL 161 (H)  7 units Novolog  243 (H)    10 units Semglee @1010  231 (H)  10 units Novolog  302 (H)  18 units Novolog  256 (H)  3 units Novolog   (H): Data is abnormally high  Latest Reference Range & Units 12/12/22 08:11  Glucose-Capillary 70 - 99 mg/dL 096 (H)  (H): Data is abnormally high    Home DM Meds: 70/30 Insulin 130 units BID   Current Orders: Semglee 10 units Daily      Novolog 3 units TID with meals      Novolog Resistant Correction Scale/ SSI (0-20 units) TID AC + HS     MD- Please consider:  1. Increase Semglee to 12 units Daily  2. Increase Novolog Meal Coverage to 6 units TID with meals     --Will follow patient during hospitalization--  Ambrose Finland RN, MSN, CDCES Diabetes Coordinator Inpatient Glycemic Control Team Team Pager: [phone] (8a-5p)

## 2022-12-12 NOTE — Progress Notes (Signed)
Rounding Note    Patient Name: Jonathan Cross. Date of Encounter: 12/12/2022  [facility] Cardiologist: Yvonne Kendall, MD   Subjective   Patient reports no further chest pain. Breathing appears better. Kidney function is stable.   Inpatient Medications    Scheduled Meds:  amiodarone  200 mg Oral Daily   apixaban  5 mg Oral BID   aspirin EC  81 mg Oral Daily   atorvastatin  80 mg Oral QPM   cyanocobalamin  500 mcg Oral Daily   insulin aspart  0-20 Units Subcutaneous TID WC   insulin aspart  0-5 Units Subcutaneous QHS   insulin aspart  6 Units Subcutaneous TID WC   [START ON 12/13/2022] insulin glargine-yfgn  12 Units Subcutaneous Daily   insulin glargine-yfgn  2 Units Subcutaneous Once   isosorbide mononitrate  30 mg Oral Daily   metoprolol succinate  25 mg Oral Daily   pantoprazole  40 mg Oral Daily   ranolazine  500 mg Oral BID   tamsulosin  0.4 mg Oral Daily   Continuous Infusions:  PRN Meds: acetaminophen, nitroGLYCERIN, ondansetron (ZOFRAN) IV, mouth rinse   Vital Signs    Vitals:   12/11/22 1952 12/11/22 2344 12/12/22 0600 12/12/22 0809  BP: 102/68 121/64 (!) 132/57 (!) 134/57  Pulse: 74 72 68 72  Resp: 16 18 18 18   Temp: 98.5 F (36.9 C) 98.2 F (36.8 C) 97.9 F (36.6 C) 97.6 F (36.4 C)  TempSrc: Oral     SpO2: 99% 100% 100% 100%  Weight:   116 kg   Height:        Intake/Output Summary (Last 24 hours) at 12/12/2022 0902 Last data filed at 12/12/2022 0603 Gross per 24 hour  Intake 720 ml  Output 1640 ml  Net -920 ml      12/12/2022    6:00 AM 12/10/2022    5:34 AM 12/09/2022    7:40 PM  Last 3 Weights  Weight (lbs) 255 lb 12.8 oz 257 lb 4.4 oz 258 lb 6.1 oz  Weight (kg) 116.03 kg 116.7 kg 117.2 kg      Telemetry    NSR HR 70s - Personally Reviewed  ECG    No new - Personally Reviewed  Physical Exam   GEN: No acute distress.   Neck: No JVD Cardiac: RRR, no murmurs, rubs, or gallops.  Respiratory:diffusely  diminished, minimal crackles GI: Soft, nontender, non-distended  MS: No edema; No deformity. Neuro:  Nonfocal  Psych: Normal affect   Labs    High Sensitivity Troponin:   Recent Labs  Lab 12/08/22 1440 12/08/22 1811  TROPONINIHS 12  12 12      Chemistry Recent Labs  Lab 12/08/22 1440 12/09/22 1223 12/10/22 0347 12/11/22 0330 12/12/22 0426  NA 132*   < > 135 132* 132*  K 4.0   < > 4.0 3.8 4.3  CL 97*   < > 102 100 102  CO2 24   < > 24 27 21*  GLUCOSE 350*   < > 210* 224* 173*  BUN 21   < > 20 23 23   CREATININE 1.39*   < > 1.19 1.19 1.22  CALCIUM 8.6*   < > 8.7* 8.4* 8.4*  MG  --    < > 2.2 2.2 2.3  PROT 7.3  --   --   --   --   ALBUMIN 3.7  --   --   --   --   AST 27  --   --   --   --  ALT 27  --   --   --   --   ALKPHOS 51  --   --   --   --   BILITOT 0.8  --   --   --   --   GFRNONAA 52*   < > >60 >60 >60  ANIONGAP 11   < > 9 5 9    < > = values in this interval not displayed.    Lipids No results for input(s): "CHOL", "TRIG", "HDL", "LABVLDL", "LDLCALC", "CHOLHDL" in the last 168 hours.  Hematology Recent Labs  Lab 12/10/22 0347 12/11/22 0330 12/12/22 0620  WBC 3.4* 4.4 3.6*  RBC 2.98* 2.85* 2.76*  HGB 11.1* 10.3* 10.2*  HCT 33.8* 32.1* 30.8*  MCV 113.4* 112.6* 111.6*  MCH 37.2* 36.1* 37.0*  MCHC 32.8 32.1 33.1  RDW 15.3 15.0 15.1  PLT 144* 129* 137*   Thyroid No results for input(s): "TSH", "FREET4" in the last 168 hours.  BNP Recent Labs  Lab 12/08/22 1540  BNP 39.2    DDimer No results for input(s): "DDIMER" in the last 168 hours.   Radiology    No results found.  Cardiac Studies   Echo 12/09/22 1. Left ventricular ejection fraction, by estimation, is 60 to 65%. The  left ventricle has normal function. Left ventricular endocardial border  not optimally defined to evaluate regional wall motion. There is mild left  ventricular hypertrophy. Left  ventricular diastolic parameters are consistent with Grade I diastolic  dysfunction  (impaired relaxation).   2. Right ventricular systolic function is mildly reduced. The right  ventricular size is normal. Tricuspid regurgitation signal is inadequate  for assessing PA pressure.   3. The mitral valve is abnormal. Mild mitral valve regurgitation. No  evidence of mitral stenosis.   4. The aortic valve was not well visualized. Aortic valve regurgitation  is not visualized. No aortic stenosis is present. There is a 23 mm bovine  valve present in the aortic position.      R/LHC 06/17/2022: Conclusions: Significant multivessel coronary artery disease, as detailed below, including 80% ostial LAD disease followed by sequential 50-60% mid LAD lesions, 50% proximal LCx disease, and 70-80% lesion in proximal segment of rPL1. Widely patent LIMA-LAD and SVG-OM2. Moderately elevated left heart filling pressures. Mildly elevated right heart and pulmonary artery pressures. Normal cardiac output/index.   Recommendations: Escalate diuresis with repeat BMP at follow-up visit next week. Secondary prevention of coronary artery disease and continued antianginal therapy. 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 __________   2D echo 05/02/2022: 1. Left ventricular ejection fraction, by estimation, is 60 to 65%. The  left ventricle has normal function. The left ventricle has no regional  wall motion abnormalities. Left ventricular diastolic parameters are  consistent with Grade II diastolic  dysfunction (pseudonormalization).   2. Right ventricular systolic function is normal. The right ventricular  size is normal. Tricuspid regurgitation signal is inadequate for assessing  PA pressure.   3. Left atrial size was mildly dilated.   4. The mitral valve is normal in structure. Mild mitral valve  regurgitation. No evidence of mitral stenosis.   5. The aortic valve has been repaired/replaced.  Aortic valve  regurgitation is not visualized. No aortic stenosis is present. Aortic  valve area, by VTI measures 2.85 cm. Aortic valve mean gradient measures  15.0 mmHg. s/p AVR bovine in 2017   6. The inferior vena cava is  normal in size with greater than 50%  respiratory variability, suggesting right atrial pressure of 3 mmHg.   Patient Profile     77 y.o. 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   Assessment & Plan    Chest pain CAD status post CABG in 2017 - presented with atypical chest pain. He has chronic generalized fatigue, SOB and chest pain in the setting of CAD with underlying MDS - HS trop negative x 2 - EKG nonischemic - echo showed LVEF 60-65%, mild LVH, G1DD, mild MR - LHC 06/2022 showed patent grafts with no indication for intervention, with residual disease - continue ASA, Lipitor, Toprol, Imdur, Toprol and Ranexa - he was restarted on Imdur 30mg  daily - patient denies further chest pain. No plan for further ischemic work-up at [facility] time   Status post AVR with h/o endocarditis - echo showed normal LVEF, G1DD, aortic valve was not well visualized, mean gradient   PAF - h/o Afib -  in NSR - continue Eliquis 5mg  BID and amiodarone 200mg  daily - CHADSVASC at least 6 - continue toprol   HFpEF - PTA lasix 20mg  BID - s/p IV lasix  - does not appear volume up on exam    MDS - Hgb was 11-12 - per hem/onc   Hyperlipidemia - continue PTA Lipitor   Hypertension - Bps good    For questions or updates, please contact [facility] Please consult [email or web address] for contact info under        Signed, Michaelina Blandino David Stall, PA-C  12/12/2022, 9:02 AM

## 2022-12-13 DIAGNOSIS — I5032 Chronic diastolic (congestive) heart failure: Secondary | ICD-10-CM | POA: Diagnosis not present

## 2022-12-13 DIAGNOSIS — I25118 Atherosclerotic heart disease of native coronary artery with other forms of angina pectoris: Secondary | ICD-10-CM | POA: Diagnosis not present

## 2022-12-13 DIAGNOSIS — I10 Essential (primary) hypertension: Secondary | ICD-10-CM | POA: Diagnosis not present

## 2022-12-13 LAB — CBC
HCT: 30 % — ABNORMAL LOW (ref 39.0–52.0)
Hemoglobin: 9.9 g/dL — ABNORMAL LOW (ref 13.0–17.0)
MCH: 36.9 pg — ABNORMAL HIGH (ref 26.0–34.0)
MCHC: 33 g/dL (ref 30.0–36.0)
MCV: 111.9 fL — ABNORMAL HIGH (ref 80.0–100.0)
Platelets: 137 10*3/uL — ABNORMAL LOW (ref 150–400)
RBC: 2.68 MIL/uL — ABNORMAL LOW (ref 4.22–5.81)
RDW: 15.2 % (ref 11.5–15.5)
WBC: 3 10*3/uL — ABNORMAL LOW (ref 4.0–10.5)
nRBC: 0 % (ref 0.0–0.2)

## 2022-12-13 LAB — GLUCOSE, CAPILLARY
Glucose-Capillary: 200 mg/dL — ABNORMAL HIGH (ref 70–99)
Glucose-Capillary: 242 mg/dL — ABNORMAL HIGH (ref 70–99)
Glucose-Capillary: 258 mg/dL — ABNORMAL HIGH (ref 70–99)
Glucose-Capillary: 230 mg/dL — ABNORMAL HIGH (ref 70–99)

## 2022-12-13 LAB — BASIC METABOLIC PANEL
Anion gap: 7 (ref 5–15)
BUN: 22 mg/dL (ref 8–23)
CO2: 25 mmol/L (ref 22–32)
Calcium: 8.9 mg/dL (ref 8.9–10.3)
Chloride: 102 mmol/L (ref 98–111)
Creatinine, Ser: 1.14 mg/dL (ref 0.61–1.24)
GFR, Estimated: >60 mL/min (ref >60)
Glucose, Bld: 208 mg/dL — ABNORMAL HIGH (ref 70–99)
Potassium: 4.7 mmol/L (ref 3.5–5.1)
Sodium: 134 mmol/L — ABNORMAL LOW (ref 135–145)

## 2022-12-13 LAB — MAGNESIUM: Magnesium: 2.2 mg/dL (ref 1.7–2.4)

## 2022-12-13 NOTE — Progress Notes (Signed)
Rounding Note    Patient Name: Jonathan Cross. Date of Encounter: 12/13/2022  [facility] Cardiologist: Yvonne Kendall, MD   Subjective   Patient denies chest pain. He reports breathing is a little worse, but his supplemental O2 came off. Repeat discussion with family yesterday resulting in the decision to pursue medical management.   Inpatient Medications    Scheduled Meds:  amiodarone  200 mg Oral Daily   apixaban  5 mg Oral BID   aspirin EC  81 mg Oral Daily   atorvastatin  80 mg Oral QPM   cyanocobalamin  500 mcg Oral Daily   insulin aspart  0-20 Units Subcutaneous TID WC   insulin aspart  0-5 Units Subcutaneous QHS   insulin aspart  6 Units Subcutaneous TID WC   insulin glargine-yfgn  12 Units Subcutaneous Daily   isosorbide mononitrate  30 mg Oral Daily   metoprolol succinate  25 mg Oral Daily   pantoprazole  40 mg Oral Daily   ranolazine  500 mg Oral BID   tamsulosin  0.4 mg Oral Daily   Continuous Infusions:  PRN Meds: acetaminophen, nitroGLYCERIN, ondansetron (ZOFRAN) IV, mouth rinse   Vital Signs    Vitals:   12/12/22 2002 12/12/22 2300 12/13/22 0414 12/13/22 0749  BP: (!) 138/56 126/60 (!) 112/48 119/67  Pulse: 69 72 64 66  Resp: 18 18 16 16   Temp: 98.4 F (36.9 C) 98.5 F (36.9 C) 97.9 F (36.6 C) (!) 97.5 F (36.4 C)  TempSrc: Oral Oral Oral Oral  SpO2: 98% 98% 100% 99%  Weight:   115.7 kg   Height:        Intake/Output Summary (Last 24 hours) at 12/13/2022 1036 Last data filed at 12/13/2022 0981 Gross per 24 hour  Intake --  Output 1750 ml  Net -1750 ml      12/13/2022    4:14 AM 12/12/2022    6:00 AM 12/10/2022    5:34 AM  Last 3 Weights  Weight (lbs) 255 lb 1.2 oz 255 lb 12.8 oz 257 lb 4.4 oz  Weight (kg) 115.7 kg 116.03 kg 116.7 kg      Telemetry    NSR HR 70s - Personally Reviewed  ECG    No new - Personally Reviewed  Physical Exam   GEN: No acute distress.   Neck: No JVD Cardiac: RRR, no murmurs, rubs,  or gallops.  Respiratory: diffusely diminished GI: Soft, nontender, non-distended  MS: No edema; No deformity. Neuro:  Nonfocal  Psych: Normal affect   Labs    High Sensitivity Troponin:   Recent Labs  Lab 12/08/22 1440 12/08/22 1811  TROPONINIHS 12  12 12      Chemistry Recent Labs  Lab 12/08/22 1440 12/09/22 1223 12/11/22 0330 12/12/22 0426 12/13/22 0747  NA 132*   < > 132* 132* 134*  K 4.0   < > 3.8 4.3 4.7  CL 97*   < > 100 102 102  CO2 24   < > 27 21* 25  GLUCOSE 350*   < > 224* 173* 208*  BUN 21   < > 23 23 22   CREATININE 1.39*   < > 1.19 1.22 1.14  CALCIUM 8.6*   < > 8.4* 8.4* 8.9  MG  --    < > 2.2 2.3 2.2  PROT 7.3  --   --   --   --   ALBUMIN 3.7  --   --   --   --  AST 27  --   --   --   --   ALT 27  --   --   --   --   ALKPHOS 51  --   --   --   --   BILITOT 0.8  --   --   --   --   GFRNONAA 52*   < > >60 >60 >60  ANIONGAP 11   < > 5 9 7    < > = values in this interval not displayed.    Lipids No results for input(s): "CHOL", "TRIG", "HDL", "LABVLDL", "LDLCALC", "CHOLHDL" in the last 168 hours.  Hematology Recent Labs  Lab 12/11/22 0330 12/12/22 0620 12/13/22 0747  WBC 4.4 3.6* 3.0*  RBC 2.85* 2.76* 2.68*  HGB 10.3* 10.2* 9.9*  HCT 32.1* 30.8* 30.0*  MCV 112.6* 111.6* 111.9*  MCH 36.1* 37.0* 36.9*  MCHC 32.1 33.1 33.0  RDW 15.0 15.1 15.2  PLT 129* 137* 137*   Thyroid No results for input(s): "TSH", "FREET4" in the last 168 hours.  BNP Recent Labs  Lab 12/08/22 1540  BNP 39.2    DDimer No results for input(s): "DDIMER" in the last 168 hours.   Radiology    No results found.  Cardiac Studies   Echo 12/09/22 1. Left ventricular ejection fraction, by estimation, is 60 to 65%. The  left ventricle has normal function. Left ventricular endocardial border  not optimally defined to evaluate regional wall motion. There is mild left  ventricular hypertrophy. Left  ventricular diastolic parameters are consistent with Grade I diastolic   dysfunction (impaired relaxation).   2. Right ventricular systolic function is mildly reduced. The right  ventricular size is normal. Tricuspid regurgitation signal is inadequate  for assessing PA pressure.   3. The mitral valve is abnormal. Mild mitral valve regurgitation. No  evidence of mitral stenosis.   4. The aortic valve was not well visualized. Aortic valve regurgitation  is not visualized. No aortic stenosis is present. There is a 23 mm bovine  valve present in the aortic position.      R/LHC 06/17/2022: Conclusions: Significant multivessel coronary artery disease, as detailed below, including 80% ostial LAD disease followed by sequential 50-60% mid LAD lesions, 50% proximal LCx disease, and 70-80% lesion in proximal segment of rPL1. Widely patent LIMA-LAD and SVG-OM2. Moderately elevated left heart filling pressures. Mildly elevated right heart and pulmonary artery pressures. Normal cardiac output/index.   Recommendations: Escalate diuresis with repeat BMP at follow-up visit next week. Secondary prevention of coronary artery disease and continued antianginal therapy. 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 __________   2D echo 05/02/2022: 1. Left ventricular ejection fraction, by estimation, is 60 to 65%. The  left ventricle has normal function. The left ventricle has no regional  wall motion abnormalities. Left ventricular diastolic parameters are  consistent with Grade II diastolic  dysfunction (pseudonormalization).   2. Right ventricular systolic function is normal. The right ventricular  size is normal. Tricuspid regurgitation signal is inadequate for assessing  PA pressure.   3. Left atrial size was mildly dilated.   4. The mitral valve is normal in structure. Mild mitral valve  regurgitation. No evidence of mitral stenosis.   5. The aortic valve has been  repaired/replaced. Aortic valve  regurgitation is not visualized. No aortic stenosis is present. Aortic  valve area, by VTI measures 2.85 cm. Aortic valve mean gradient measures  15.0 mmHg. s/p AVR bovine in 2017   6. The inferior vena cava is normal in size with greater than 50%  respiratory variability, suggesting right atrial pressure of 3 mmHg.     Patient Profile     77 y.o. 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   Assessment & Plan    Chest pain CAD status post CABG in 2017 - presented with atypical chest pain. He has chronic generalized fatigue, SOB and chest pain in the setting of CAD with underlying MDS - HS trop negative x 2 - EKG nonischemic - echo showed LVEF 60-65%, mild LVH, G1DD, mild MR - LHC 06/2022 showed patent grafts with no indication for intervention, with residual disease - continue ASA, Lipitor, Toprol, Imdur, Toprol and Ranexa - he was restarted on Imdur 30mg  daily - patient denies further chest pain. Multiple discussions with family regarding medical management vs cath. No plan for further ischemic work-up at this time.   Status post AVR with h/o endocarditis - echo showed normal LVEF, G1DD, aortic valve was not well visualized, mean gradient   PAF - h/o Afib -  in NSR - continue Eliquis 5mg  BID and amiodarone 200mg  daily - CHADSVASC at least 6 - continue toprol   HFpEF - PTA lasix 20mg  BID - s/p IV lasix during admission - does not appear volume up on exam   MDS - Hgb was 11-12 - per hem/onc   Hyperlipidemia - continue PTA Lipitor   Hypertension - Bps good    For questions or updates, please contact [facility] Please consult [email or web address] for contact info under         Signed, Kinzly Pierrelouis David Stall, PA-C  12/13/2022, 10:36 AM

## 2022-12-13 NOTE — Progress Notes (Signed)
[facility]  - [place] [facility]   PATIENT NAME: Jonathan Cross    MR#:  [id number]  DATE OF BIRTH:  April 21, 1946  SUBJECTIVE:   No family at bedside. Patient earlier felt not good. Feeling tired. Was not able to exactly tell me his symptoms. Checked on him again he did say he ate some food. Sitting out in the chair.   VITALS:  Blood pressure 135/68, pulse 69, temperature 97.6 F (36.4 C), temperature source Oral, resp. rate 16, height 5\' 9"  (1.753 m), weight 115.7 kg, SpO2 98 %.  PHYSICAL EXAMINATION:   GENERAL:  77 y.o.-year-old patient with no acute distress. Morbidly obese LUNGS: Normal breath sounds bilaterally, no wheezing CARDIOVASCULAR: S1, S2 normal. No murmur   ABDOMEN: Soft, nontender, nondistended. Bowel sounds present.  EXTREMITIES: +  edema b/l.    NEUROLOGIC: nonfocal  patient is alert and awake  LABORATORY PANEL:  CBC Recent Labs  Lab 12/13/22 0747  WBC 3.0*  HGB 9.9*  HCT 30.0*  PLT 137*    Chemistries  Recent Labs  Lab 12/08/22 1440 12/09/22 1223 12/13/22 0747  NA 132*   < > 134*  K 4.0   < > 4.7  CL 97*   < > 102  CO2 24   < > 25  GLUCOSE 350*   < > 208*  BUN 21   < > 22  CREATININE 1.39*   < > 1.14  CALCIUM 8.6*   < > 8.9  MG  --    < > 2.2  AST 27  --   --   ALT 27  --   --   ALKPHOS 51  --   --   BILITOT 0.8  --   --    < > = values in this interval not displayed.    78 year old with history of CAD status post CABG, 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  Upon admission CT head, CT chest abdomen pelvis does not show any acute pathology.  Echocardiogram showed preserved EF with grade 1 DD.  At this time opting for medical management but may end up needing LHC/RHC.  Patient also seen by pulmonary who strongly  recommends weight loss otherwise outpatient PFTs.   Chest pain, undetermined etiology--appears atypical per cardiology History of CAD s/p CABG with patent grafts 06/2022, history of MDS, history of endocarditis --Troponins are flat, EKG is nonischemic.   --Echocardiogram shows EF of 65% with grade 1 DD. NO plans for cardiology in terms of catheterization. --cardiology has signed off and ok to d/c from their end   Coronary artery disease of native artery of native heart with stable angina pectoris (HCC) History of CABG Possible stable , less likely unstable angina -- on aspirin, statin, Toprol-XL 25 mg daily and Ranexa.  Imdur added.  Adjust medications as necessary.   Paroxysmal atrial fibrillation/A-flutter (HCC) --A flutter On EKG but Rate Controlled -Continue amiodarone and Eliquis   AKI (acute kidney injury) (HCC) Hyponatremia --Baseline creatinine 1.0, currently stable   Acute respiratory distress - Intermittent.  Seen by pulmonary Dr Elgie Collard.  Suspect from underlying multiple issues including cardiac, obesity and anemia --Recommending outpatient PFTs when he is more clinically stable and weight loss   Uncontrolled type 2 diabetes mellitus with hyperglycemia, with long-term current use of insulin (HCC) Long-acting insulin and  Premeal.  Adjust as necessary Sliding scale and Accu-Cheks   Myelodysplastic syndrome (HCC) Macrocytic anemia No acute issues suspected Stable, follows outpatient with Dr Cathie Hoops   Chronic heart failure with preserved ejection fraction (HFpEF) (HCC) Compensated Meds as above   S/P aortic valve replacement History of endocarditis post aortic valve replacement No acute issues suspected   Essential hypertension Continue metoprolol   PT/OT= no follow up    Procedures: none Family communication :dter Victorino Dike Consults : cardiology, pulmonology CODE STATUS: full DVT Prophylaxis : eliquis Level of care: Telemetry Cardiac Status is:  Inpatient Remains inpatient appropriate because: overall stable. Patient feeling fatigued wants to stay another day in the hospital. Will discharge tomorrow if remains stable    TOTAL TIME TAKING CARE OF THIS PATIENT: 35 minutes.  >50% time spent on counselling and coordination of care  Note: This dictation was prepared with Dragon dictation along with smaller phrase technology. Any transcriptional errors that result from this process are unintentional.  Enedina Finner M.D    [facility]   CC: Primary care physician; Marva Panda, MD

## 2022-12-13 NOTE — Plan of Care (Signed)
  Problem: Education: Goal: Ability to describe self-care measures that may prevent or decrease complications (Diabetes Survival Skills Education) will improve Outcome: Progressing Goal: Individualized Educational Video(s) Outcome: Progressing   Problem: Coping: Goal: Ability to adjust to condition or change in health will improve Outcome: Progressing   Problem: Fluid Volume: Goal: Ability to maintain a balanced intake and output will improve Outcome: Progressing   Problem: Health Behavior/Discharge Planning: Goal: Ability to identify and utilize available resources and services will improve Outcome: Progressing Goal: Ability to manage health-related needs will improve Outcome: Progressing   Problem: Metabolic: Goal: Ability to maintain appropriate glucose levels will improve Outcome: Progressing   Problem: Nutritional: Goal: Maintenance of adequate nutrition will improve Outcome: Progressing Goal: Progress toward achieving an optimal weight will improve Outcome: Progressing   Problem: Skin Integrity: Goal: Risk for impaired skin integrity will decrease Outcome: Progressing   Problem: Tissue Perfusion: Goal: Adequacy of tissue perfusion will improve Outcome: Progressing   Problem: Education: Goal: Understanding of cardiac disease, CV risk reduction, and recovery process will improve Outcome: Progressing Goal: Individualized Educational Video(s) Outcome: Progressing   Problem: Activity: Goal: Ability to tolerate increased activity will improve Outcome: Progressing   Problem: Cardiac: Goal: Ability to achieve and maintain adequate cardiovascular perfusion will improve Outcome: Progressing   Problem: Health Behavior/Discharge Planning: Goal: Ability to safely manage health-related needs after discharge will improve Outcome: Progressing   Problem: Education: Goal: Knowledge of General Education information will improve Description: Including pain rating scale,  medication(s)/side effects and non-pharmacologic comfort measures Outcome: Progressing   Problem: Health Behavior/Discharge Planning: Goal: Ability to manage health-related needs will improve Outcome: Progressing   Problem: Clinical Measurements: Goal: Ability to maintain clinical measurements within normal limits will improve Outcome: Progressing Goal: Will remain free from infection Outcome: Progressing Goal: Diagnostic test results will improve Outcome: Progressing Goal: Respiratory complications will improve Outcome: Progressing Goal: Cardiovascular complication will be avoided Outcome: Progressing   Problem: Activity: Goal: Risk for activity intolerance will decrease Outcome: Progressing   Problem: Nutrition: Goal: Adequate nutrition will be maintained Outcome: Progressing   Problem: Coping: Goal: Level of anxiety will decrease Outcome: Progressing   Problem: Elimination: Goal: Will not experience complications related to bowel motility Outcome: Progressing Goal: Will not experience complications related to urinary retention Outcome: Progressing   Problem: Pain Managment: Goal: General experience of comfort will improve Outcome: Progressing   Problem: Safety: Goal: Ability to remain free from injury will improve Outcome: Progressing   Problem: Skin Integrity: Goal: Risk for impaired skin integrity will decrease Outcome: Progressing   

## 2022-12-13 NOTE — Progress Notes (Addendum)
Occupational Therapy Treatment Patient Details Name: Jonathan Cross. 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  Upon admission CT head, CT chest abdomen pelvis does not show any acute pathology.  MD assessment includes: chest pain, undetermined etiology, A-fib, and AKI.   OT comments  Pt. education was provided about PLB techniques, energy conservation, and work simplification strategies for ADLs, and IADLs. Pt. Was provided with a visual handout. Pt. was assisted with repositioning in the recliner chair. Pt. Reports concern about SOB with minimal activity recently. Pt. SpO2 is 96%with the HR 70 bpms. Pt. Continues to benefit from OT services for ADL training, A/E training, and pt. Education about energy conservation/work simplification strategies, home modification, and DME.    Recommendations for follow up therapy are one component of a multi-disciplinary discharge planning process, led by the attending physician.  Recommendations may be updated based on patient status, additional functional criteria and insurance authorization.    Assistance Recommended at Discharge    Patient can return home with the following  Assistance with cooking/housework;Help with stairs or ramp for entrance;Assist for transportation   Equipment Recommendations  None recommended by OT    Recommendations for Other Services      Precautions / Restrictions Precautions Precautions: Fall Restrictions Weight Bearing Restrictions: No       Mobility Bed Mobility Overal bed mobility: Modified Independent                  Transfers Overall transfer level:  Modified independent     Sit to Stand: Modified independent (Device/Increase time)                 Balance                                           ADL either performed or assessed with clinical judgement   ADL Overall ADL's : Needs assistance/impaired                                            Extremity/Trunk Assessment Upper Extremity Assessment Upper Extremity Assessment: Overall WFL for tasks assessed            Vision Baseline Vision/History: 1 Wears glasses Patient Visual Report: No change from baseline     Perception     Praxis      Cognition Arousal/Alertness: Awake/alert Behavior During Therapy: WFL for tasks assessed/performed Overall Cognitive Status: Within Functional Limits for tasks assessed                                          Exercises      Shoulder Instructions       General Comments      Pertinent Vitals/ Pain        No pain/ Denies pain  Home Living  Prior Functioning/Environment              Frequency  Min 1X/week        Progress Toward Goals  OT Goals(current goals can now be found in the care plan section)  Progress towards OT goals: Progressing toward goals  Acute Rehab OT Goals Patient Stated Goal: To return home OT Goal Formulation: With patient Time For Goal Achievement: 12/24/22 Potential to Achieve Goals: Good  Plan Discharge plan remains appropriate    Co-evaluation                 AM-PAC OT "6 Clicks" Daily Activity     Outcome Measure   Help from another person eating meals?: None Help from another person taking care of personal grooming?: None Help from another person toileting, which includes using toliet, bedpan, or urinal?: A Little Help from another person bathing (including washing, rinsing, drying)?: A Little Help from another person to put on and taking off  regular upper body clothing?: None Help from another person to put on and taking off regular lower body clothing?: A Little 6 Click Score: 21    End of Session        Activity Tolerance Patient tolerated treatment well   Patient Left in chair;with chair alarm set;with call bell/phone within reach   Nurse Communication          Time: 1425-1440 OT Time Calculation (min): 15 min  Charges: OT General Charges $OT Visit: 1 Visit OT Treatments $Self Care/Home Management : 8-22 mins  Olegario Messier, MS, OTR/L  Olegario Messier 12/13/2022, 3:07 PM

## 2022-12-14 DIAGNOSIS — I480 Paroxysmal atrial fibrillation: Secondary | ICD-10-CM

## 2022-12-14 DIAGNOSIS — E1165 Type 2 diabetes mellitus with hyperglycemia: Secondary | ICD-10-CM

## 2022-12-14 DIAGNOSIS — R0789 Other chest pain: Secondary | ICD-10-CM

## 2022-12-14 DIAGNOSIS — I25118 Atherosclerotic heart disease of native coronary artery with other forms of angina pectoris: Secondary | ICD-10-CM | POA: Diagnosis not present

## 2022-12-14 DIAGNOSIS — I5032 Chronic diastolic (congestive) heart failure: Secondary | ICD-10-CM | POA: Diagnosis not present

## 2022-12-14 DIAGNOSIS — L8990 Pressure ulcer of unspecified site, unspecified stage: Secondary | ICD-10-CM | POA: Insufficient documentation

## 2022-12-14 DIAGNOSIS — Z794 Long term (current) use of insulin: Secondary | ICD-10-CM

## 2022-12-14 LAB — GLUCOSE, CAPILLARY
Glucose-Capillary: 205 mg/dL — ABNORMAL HIGH (ref 70–99)
Glucose-Capillary: 178 mg/dL — ABNORMAL HIGH (ref 70–99)

## 2022-12-14 LAB — CULTURE, BLOOD (ROUTINE X 2)
Culture: NO GROWTH
Special Requests: ADEQUATE

## 2022-12-14 LAB — TROPONIN I (HIGH SENSITIVITY): Troponin I (High Sensitivity): 14 ng/L (ref <18)

## 2022-12-14 MED ORDER — INSULIN GLARGINE-YFGN 100 UNIT/ML ~~LOC~~ SOLN
18.0000 [IU] | Freq: Every day | SUBCUTANEOUS | Status: DC
  Administered 2022-12-14: 18 [IU] via SUBCUTANEOUS
  Filled 2022-12-14: qty 0.18

## 2022-12-14 MED ORDER — ISOSORBIDE MONONITRATE ER 30 MG PO TB24: 15.0000 mg | ORAL_TABLET | Freq: Every day | ORAL | 2 refills | Status: DC

## 2022-12-14 MED ORDER — METOPROLOL SUCCINATE ER 25 MG PO TB24: 25.0000 mg | ORAL_TABLET | Freq: Two times a day (BID) | ORAL | 1 refills | Status: DC

## 2022-12-14 MED ORDER — INSULIN ASPART 100 UNIT/ML IJ SOLN
8.0000 [IU] | Freq: Three times a day (TID) | INTRAMUSCULAR | Status: DC
  Administered 2022-12-14: 8 [IU] via SUBCUTANEOUS
  Filled 2022-12-14: qty 1

## 2022-12-14 NOTE — TOC Transition Note (Signed)
Transition of Care [facility]) - CM/SW Discharge Note   Patient Details  Name: Jonathan Cross. MRN: [id number] Date of Birth: 02/04/1946  [facility]) CM/SW Contact:  Kemper Durie, RN Phone Number: 12/14/2022, 1:10 PM   Clinical Narrative:     Patient aware of discharge orders home, will call family for transportation.  Advised of orders for home oxygen, he agrees.  Spoke with Jasmine with [employer], DME will be delivered to the bedside prior to discharge.   Final next level of care: Home/Self Care Barriers to Discharge: Barriers Resolved   Patient Goals and CMS Choice      Discharge Placement                         Discharge Plan and Services Additional resources added to the After Visit Summary for                  DME Arranged: Oxygen DME Agency: [employer] Date DME Agency Contacted: 12/14/22 Time DME Agency Contacted: [id number] Representative spoke with at DME Agency: Leavy Cella            Social Determinants of Health (SDOH) Interventions SDOH Screenings   Food Insecurity: No Food Insecurity (12/09/2022)  Housing: Low Risk  (12/09/2022)  Transportation Needs: No Transportation Needs (12/09/2022)  Utilities: Not At Risk (12/09/2022)  Tobacco Use: Medium Risk (12/08/2022)     Readmission Risk Interventions     No data to display

## 2022-12-14 NOTE — Progress Notes (Signed)
SATURATION QUALIFICATIONS: (This note is used to comply with regulatory documentation for home oxygen)  Patient Saturations on Room Air at Rest = 96%  Patient Saturations on Room Air while Ambulating = 88%  Patient Saturations on 0 Liters of oxygen while Ambulating = 88%  Please briefly explain why patient needs home oxygen:  Ambulated patient, approximately 60 ft, patient has the same discomfort(pressure in chest)  as in sitting position.

## 2022-12-14 NOTE — Progress Notes (Addendum)
Patient complained of discomfort on his chest, explained like a pressure in his chest, MD made aware of, Nitroglycerine 3 pills PRN, 1 every 5 minutes given, EKG done, Troponin was ordered.

## 2022-12-14 NOTE — Discharge Summary (Addendum)
Physician Discharge Summary   Patient: Jonathan Cross. MRN: [id number] DOB: [birth year]  Admit date:     12/08/2022  Discharge date: 12/14/22  Discharge Physician: Enedina Finner   PCP: Marva Panda, MD   Recommendations at discharge:   follow-up [facility] PCP in 1 to 2 week keep log of his sugars at home and resume your insulin dosage as per your PCP recommendation. Follow-up [facility] cardiology in one week.   Discharge Diagnoses: Principal Problem:   Chest pain, undetermined etiology Active Problems:   Coronary artery disease of native artery of native heart with stable angina pectoris (HCC)   Status post coronary artery bypass graft   Paroxysmal atrial fibrillation/A-flutter (HCC)   AKI (acute kidney injury) (HCC)   Uncontrolled type 2 diabetes mellitus with hyperglycemia, with long-term current use of insulin (HCC)   Essential hypertension   S/P aortic valve replacement   Chronic heart failure with preserved ejection fraction (HFpEF) (HCC)   Myelodysplastic syndrome (HCC)  77 year old with history of CAD status post CABG, 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  Upon admission CT head, CT chest abdomen pelvis does not show any acute pathology.  Echocardiogram showed preserved EF with grade 1 DD.  At this time opting for medical management but may end up needing LHC/RHC.  Patient also seen by pulmonary who strongly recommends weight loss otherwise outpatient PFTs.   Chest pain, undetermined etiology--appears atypical per cardiology History of CAD s/p CABG with patent grafts 06/2022, history of MDS, history of endocarditis --Troponins are flat, EKG is nonischemic.   --Echocardiogram shows EF of 65% with grade 1 DD. NO plans for cardiology in terms of  catheterization. --cardiology has signed off and ok to d/c from their end -- patient had chest discomfort. He was unable to describe his symptoms. Repeated EKG today showed sinus rhythm. Troponin 14.   Coronary artery disease of native artery of native heart with stable angina pectoris (HCC) History of CABG Possible stable , less likely unstable angina -- on aspirin, statin, Toprol-XL 25 mg daily and Ranexa.  Imdur added.  Adjust medications as necessary.   Paroxysmal atrial fibrillation/A-flutter (HCC) --A flutter On EKG but Rate Controlled -Continue amiodarone and Eliquis   AKI (acute kidney injury) (HCC) Hyponatremia --Baseline creatinine 1.0, currently stable   Acute respiratory distress -  Seen by pulmonary Dr Elgie Collard.  Suspect from underlying multiple issues including cardiac, obesity and anemia --Recommending outpatient PFTs as out pt -- ambulated patient around and oxygen drop to 88% on room air with ambulation. Came up with 2 L nasal cannula to 96%. Will send patient home with oxygen. Son Square Ensinger aware   Uncontrolled type 2 diabetes mellitus with hyperglycemia, with long-term current use of insulin (HCC) Long-acting insulin and Premeal.  Adjust as necessary Sliding scale and Accu-Cheks   Myelodysplastic syndrome (HCC) Macrocytic anemia Stable, follows outpatient with Dr Shepard [facility]   Chronic heart failure with preserved ejection fraction (HFpEF) (HCC) Compensated Meds as above   S/P aortic valve replacement History of endocarditis post aortic valve replacement No acute issues suspected   Essential hypertension Continue metoprolol   PT/OT= no follow up. Patient recommended to uses walker at home.  Patient will discharge to home with oxygen today.       Procedures: none Family communication : son Jonathan Cross Consults :  cardiology, pulmonology CODE STATUS: full DVT Prophylaxis : eliquis Level of care: Telemetry Cardiac    Diet recommendation:   Discharge Diet Orders (From admission, onward)     Start     Ordered   12/14/22 0000  Diet - low sodium heart healthy        12/14/22 1250           Cardiac and Carb modified diet DISCHARGE MEDICATION: Allergies as of 12/14/2022       Reactions   Metformin Diarrhea   Loose stools even with XR        Medication List     STOP taking these medications    loratadine 10 MG tablet Commonly known as: CLARITIN   meclizine 25 MG tablet Commonly known as: ANTIVERT   methocarbamol 500 MG tablet Commonly known as: ROBAXIN       TAKE these medications    amiodarone 200 MG tablet Commonly known as: PACERONE Take 1 tablet by mouth once daily   aspirin EC 81 MG tablet Take 81 mg by mouth daily. Swallow whole.   atorvastatin 80 MG tablet Commonly known as: LIPITOR Take 1 tablet (80 mg total) by mouth every evening.   Eliquis 5 MG Tabs tablet Generic drug: apixaban Take 5 mg by mouth 2 (two) times daily.   furosemide 40 MG tablet Commonly known as: LASIX Take 1 tablet (40 mg total) by mouth 2 (two) times daily.   insulin NPH-regular Human (70-30) 100 UNIT/ML injection Inject 130 Units into the skin 2 (two) times daily.   isosorbide mononitrate 30 MG 24 hr tablet Commonly known as: IMDUR Take 0.5 tablets (15 mg total) by mouth daily.   Magnesium Oxide 400 MG Caps Take 1 capsule (400 mg total) by mouth daily.   metoprolol succinate 25 MG 24 hr tablet Commonly known as: TOPROL-XL Take 1 tablet (25 mg total) by mouth 2 (two) times daily.   nitroGLYCERIN 0.4 MG SL tablet Commonly known as: NITROSTAT Place 1 tablet (0.4 mg total) under the tongue every 5 (five) minutes x 3 doses as needed for chest pain.   omeprazole 20 MG capsule Commonly known as: PRILOSEC Take 40 mg by mouth daily.   ranolazine 500 MG 12 hr tablet Commonly known as: Ranexa Take 1 tablet (500 mg total) by mouth 2 (two) times daily.   tamsulosin 0.4 MG Caps capsule Commonly known as:  FLOMAX Take 0.4 mg by mouth daily.   vitamin B-12 500 MCG tablet Commonly known as: CYANOCOBALAMIN Take 1 tablet (500 mcg total) by mouth daily.               Durable Medical Equipment  (From admission, onward)           Start     Ordered   12/14/22 1246  For home use only DME oxygen  Once       Question Answer Comment  Length of Need 6 Months   Mode or (Route) Nasal cannula   Liters per Minute 2   Frequency Continuous (stationary and portable oxygen unit needed)   Oxygen conserving device Yes   Oxygen delivery system Gas      12/14/22 1245              Discharge Care Instructions  (From admission, onward)           Start     Ordered   12/14/22 0000  Discharge wound care:       Comments: Pressure Injury 12/09/22  Buttocks Right;Left;Medial Stage 1 -  Intact skin with non-blanchable redness of a localized area usually over a bony prominence. pt had stage 2 bed sores. now scabbed over. 5 days   12/14/22 1250            Follow-up Information     Marva Panda, MD. Schedule an appointment as soon as possible for a visit in 1 week(s).   Specialty: Internal Medicine Why: hospital f/u Contact information: [address] [place] [address] [phone]         Yvonne Kendall, MD. Schedule an appointment as soon as possible for a visit in 1 week(s).   Specialty: Cardiology Why: CAD, chronic angina Contact information: [address] [place] [address] [phone]                Discharge Exam: Ceasar Mons Weights   12/12/22 0600 12/13/22 0414 12/14/22 0413  Weight: 116 kg 115.7 kg 117.7 kg  GENERAL:  77 y.o.-year-old patient with no acute distress. Morbidly obese LUNGS: Normal breath sounds bilaterally, no wheezing CARDIOVASCULAR: S1, S2 normal. No murmur   ABDOMEN: Soft, nontender, nondistended.abdominal obesity EXTREMITIES: +  edema b/l.    NEUROLOGIC: nonfocal  patient is alert and awake Pressure Injury  12/09/22 Buttocks Right;Left;Medial Stage 1 -  Intact skin with non-blanchable redness of a localized area usually over a bony prominence. pt had stage 2 bed sores. now scabbed over. (Active)  12/09/22 0739  Location: Buttocks  Location Orientation: Right;Left;Medial  Staging: Stage 1 -  Intact skin with non-blanchable redness of a localized area usually over a bony prominence. (no open wound found on assessment)  Wound Description (Comments): pt had stage 2 bed sores. now scabbed over.  Present on Admission: Yes       Condition at discharge: fair  The results of significant diagnostics from this hospitalization (including imaging, microbiology, ancillary and laboratory) are listed below for reference.   Imaging Studies: ECHOCARDIOGRAM COMPLETE  Result Date: 12/09/2022    ECHOCARDIOGRAM REPORT   Patient Name:   Solan Lammers [facility]. Date of Exam: 12/09/2022 Medical Rec #:  [id number]              Height:       69.0 in Accession #:    [id number]             Weight:       260.1 lb Date of Birth:  10-05-1945              BSA:          2.310 m Patient Age:    71 years               BP:           151/70 mmHg Patient Gender: M                      HR:           78 bpm. Exam Location:  [facility] Procedure: 2D Echo, Cardiac Doppler and Color Doppler Indications:     Chest pain R07.9  History:         Patient has prior history of Echocardiogram examinations, most                  recent 05/02/2022. Risk Factors:Hypertension. Aortic valve                  replacement.  Aortic Valve: 23 mm bovine valve is present in the aortic                  position.  Sonographer:     Cristela Blue Referring Phys:  [id number] Andris Baumann Diagnosing Phys: Yvonne Kendall MD  Sonographer Comments: Suboptimal apical window. IMPRESSIONS  1. Left ventricular ejection fraction, by estimation, is 60 to 65%. The left ventricle has normal function. Left ventricular endocardial border not optimally defined to evaluate  regional wall motion. There is mild left ventricular hypertrophy. Left ventricular diastolic parameters are consistent with Grade I diastolic dysfunction (impaired relaxation).  2. Right ventricular systolic function is mildly reduced. The right ventricular size is normal. Tricuspid regurgitation signal is inadequate for assessing PA pressure.  3. The mitral valve is abnormal. Mild mitral valve regurgitation. No evidence of mitral stenosis.  4. The aortic valve was not well visualized. Aortic valve regurgitation is not visualized. No aortic stenosis is present. There is a 23 mm bovine valve present in the aortic position. FINDINGS  Left Ventricle: Left ventricular ejection fraction, by estimation, is 60 to 65%. The left ventricle has normal function. Left ventricular endocardial border not optimally defined to evaluate regional wall motion. The left ventricular internal cavity size was normal in size. There is mild left ventricular hypertrophy. Left ventricular diastolic parameters are consistent with Grade I diastolic dysfunction (impaired relaxation). Right Ventricle: The right ventricular size is normal. No increase in right ventricular wall thickness. Right ventricular systolic function is mildly reduced. Tricuspid regurgitation signal is inadequate for assessing PA pressure. Left Atrium: Left atrial size was normal in size. Right Atrium: Right atrial size was normal in size. Pericardium: The pericardium was not well visualized. Mitral Valve: The mitral valve is abnormal. There is mild thickening of the mitral valve leaflet(s). There is mild calcification of the mitral valve leaflet(s). Mild mitral valve regurgitation. No evidence of mitral valve stenosis. MV peak gradient, 6.5 mmHg. The mean mitral valve gradient is 3.0 mmHg. Tricuspid Valve: The tricuspid valve is not well visualized. Tricuspid valve regurgitation is trivial. Aortic Valve: The aortic valve was not well visualized. Aortic valve regurgitation is  not visualized. No aortic stenosis is present. Aortic valve mean gradient measures 5.0 mmHg. Aortic valve peak gradient measures 8.8 mmHg. Aortic valve area, by VTI measures 1.70 cm. There is a 23 mm bovine valve present in the aortic position. Pulmonic Valve: The pulmonic valve was not well visualized. Pulmonic valve regurgitation is not visualized. No evidence of pulmonic stenosis. Aorta: The aortic root is normal in size and structure. Pulmonary Artery: The pulmonary artery is not well seen. Venous: The inferior vena cava was not well visualized. IAS/Shunts: The interatrial septum was not well visualized.  LEFT VENTRICLE PLAX 2D LVIDd:         3.90 cm   Diastology LVIDs:         2.80 cm   LV e' medial:    4.13 cm/s LV PW:         1.20 cm   LV E/e' medial:  21.1 LV IVS:        1.25 cm   LV e' lateral:   11.60 cm/s LVOT diam:     2.00 cm   LV E/e' lateral: 7.5 LV SV:         50 LV SV Index:   22 LVOT Area:     3.14 cm  RIGHT VENTRICLE RV Basal diam:  3.00 cm RV Mid diam:  2.80 cm RV S prime:     7.83 cm/s TAPSE (M-mode): 1.7 cm LEFT ATRIUM             Index        RIGHT ATRIUM           Index LA diam:        3.70 cm 1.60 cm/m   RA Area:     11.84 cm LA Vol (A2C):   30.2 ml 13.07 ml/m  RA Volume:   24.94 ml  10.79 ml/m LA Vol (A4C):   31.2 ml 13.51 ml/m LA Biplane Vol: 32.3 ml 13.98 ml/m  AORTIC VALVE AV Area (Vmax):    1.71 cm AV Area (Vmean):   1.82 cm AV Area (VTI):     1.70 cm AV Vmax:           148.00 cm/s AV Vmean:          100.925 cm/s AV VTI:            0.296 m AV Peak Grad:      8.8 mmHg AV Mean Grad:      5.0 mmHg LVOT Vmax:         80.60 cm/s LVOT Vmean:        58.400 cm/s LVOT VTI:          0.160 m LVOT/AV VTI ratio: 0.54  AORTA Ao Root diam: 3.00 cm MITRAL VALVE MV Area (PHT): 3.00 cm     SHUNTS MV Area VTI:   1.67 cm     Systemic VTI:  0.16 m MV Peak grad:  6.5 mmHg     Systemic Diam: 2.00 cm MV Mean grad:  3.0 mmHg MV Vmax:       1.27 m/s MV Vmean:      75.7 cm/s MV Decel Time: 253 msec  MV E velocity: 87.00 cm/s MV A velocity: 119.00 cm/s MV E/A ratio:  0.73 Cristal Deer End MD Electronically signed by Yvonne Kendall MD Signature Date/Time: 12/09/2022/12:47:54 PM    Final    CT ABDOMEN PELVIS W CONTRAST  Result Date: 12/08/2022 CLINICAL DATA:  Abdominal pain. EXAM: CT ABDOMEN AND PELVIS WITH CONTRAST TECHNIQUE: Multidetector CT imaging of the abdomen and pelvis was performed using the standard protocol following bolus administration of intravenous contrast. RADIATION DOSE REDUCTION: This exam was performed according to the departmental dose-optimization program which includes automated exposure control, adjustment of the mA and/or kV according to patient size and/or use of iterative reconstruction technique. CONTRAST:  OMNIPAQUE IOHEXOL 350 MG/ML SOLN COMPARISON:  August 11, 2022 FINDINGS: Hepatobiliary: Several few mm too small to be actually characterized hypoattenuated lesions throughout the liver. Decompressed gallbladder. Pancreas: Unremarkable. No pancreatic ductal dilatation or surrounding inflammatory changes. Spleen: Normal in size without focal abnormality. Adrenals/Urinary Tract: Adrenal glands are unremarkable. Kidneys are normal, without renal calculi, focal lesion, or hydronephrosis. Left parapelvic cyst. Bladder is unremarkable. Stomach/Bowel: Stomach is within normal limits. No evidence of bowel wall thickening, distention, or inflammatory changes. Vascular/Lymphatic: Aortic atherosclerosis. No enlarged abdominal or pelvic lymph nodes. Reproductive: Mildly enlarged prostate gland. Other: Nonspecific fat stranding in the subcutaneous tissues in the right mid abdomen. Musculoskeletal: Spondylosis of the lumbosacral spine. IMPRESSION: 1. No acute findings within the abdomen or pelvis. 2. Nonspecific fat stranding in the subcutaneous tissues in the right mid abdomen. 3. Aortic atherosclerosis. 4. Several few mm too small to be actually characterized hypoattenuated lesions  throughout the liver. Aortic Atherosclerosis (ICD10-I70.0). Electronically Signed   By: Sharlyne Pacas  Dimitrova M.D.   On: 12/08/2022 17:04   CT Angio Chest PE W and/or Wo Contrast  Result Date: 12/08/2022 CLINICAL DATA:  Pulmonary embolus suspected. EXAM: CT ANGIOGRAPHY CHEST WITH CONTRAST TECHNIQUE: Multidetector CT imaging of the chest was performed using the standard protocol during bolus administration of intravenous contrast. Multiplanar CT image reconstructions and MIPs were obtained to evaluate the vascular anatomy. RADIATION DOSE REDUCTION: This exam was performed according to the departmental dose-optimization program which includes automated exposure control, adjustment of the mA and/or kV according to patient size and/or use of iterative reconstruction technique. CONTRAST:  OMNIPAQUE IOHEXOL 350 MG/ML SOLN COMPARISON:  January 10, 2023 FINDINGS: Cardiovascular: Satisfactory opacification of the pulmonary arteries to the segmental level. No evidence of pulmonary embolism. Normal heart size. No pericardial effusion. Calcific atherosclerotic disease of the coronary arteries and aorta. Postsurgical changes from CABG. Mediastinum/Nodes: No enlarged mediastinal, hilar, or axillary lymph nodes. Thyroid gland, trachea, and esophagus demonstrate no significant findings. Lungs/Pleura: Lungs are clear. No pleural effusion or pneumothorax. Musculoskeletal: No chest wall abnormality. No acute or significant osseous findings. Review of the MIP images confirms the above findings. IMPRESSION: 1. No evidence of pulmonary embolus. 2. Calcific atherosclerotic disease of the coronary arteries and aorta. 3. Postsurgical changes from CABG. 4. Aortic atherosclerosis. Aortic Atherosclerosis (ICD10-I70.0). Electronically Signed   By: Ted Mcalpine M.D.   On: 12/08/2022 16:58   CT HEAD WO CONTRAST ( )  Result Date: 12/08/2022 CLINICAL DATA:  Headache, new onset (Age >= 51y) EXAM: CT HEAD WITHOUT CONTRAST TECHNIQUE:  Contiguous axial images were obtained from the base of the skull through the vertex without intravenous contrast. RADIATION DOSE REDUCTION: This exam was performed according to the departmental dose-optimization program which includes automated exposure control, adjustment of the mA and/or kV according to patient size and/or use of iterative reconstruction technique. COMPARISON:  11/01/2021 FINDINGS: Brain: No evidence of acute infarction, hemorrhage, hydrocephalus, extra-axial collection or mass lesion/mass effect. Stable degree of atrophy and chronic small vessel ischemia. Remote left parietal infarct. Vascular: Atherosclerosis of skullbase vasculature without hyperdense vessel or abnormal calcification. Skull: No fracture or focal lesion. Sinuses/Orbits: Mild chronic mucosal thickening of paranasal sinuses. Prior right cataract resection. Other: None. IMPRESSION: 1. No acute intracranial abnormality. 2. Stable atrophy and chronic small vessel ischemia. Remote left parietal infarct. Electronically Signed   By: Narda Rutherford M.D.   On: 12/08/2022 16:53    Microbiology: Results for orders placed or performed during the hospital encounter of 12/08/22  Culture, blood (Routine X 2) w Reflex to ID Panel     Status: None   Collection Time: 12/08/22 11:38 PM   Specimen: BLOOD  Result Value Ref Range Status   Specimen Description BLOOD BLOOD RIGHT ARM  Final   Special Requests   Final    BOTTLES DRAWN AEROBIC AND ANAEROBIC Blood Culture adequate volume   Culture   Final    NO GROWTH 5 DAYS Performed at [facility], [address] [place], [place] [address]    Report Status 12/14/2022 FINAL  Final  Culture, blood (Routine X 2) w Reflex to ID Panel     Status: None   Collection Time: 12/08/22 11:38 PM   Specimen: BLOOD  Result Value Ref Range Status   Specimen Description BLOOD BLOOD LEFT ARM  Final   Special Requests   Final    BOTTLES DRAWN AEROBIC AND ANAEROBIC Blood Culture adequate  volume   Culture   Final    NO GROWTH 5 DAYS Performed  at [facility], [address] [place], [place] [address]    Report Status 12/14/2022 FINAL  Final    Labs: CBC: Recent Labs  Lab 12/08/22 1440 12/09/22 0951 12/10/22 0347 12/11/22 0330 12/12/22 0620 12/13/22 0747  WBC 2.8* 2.8* 3.4* 4.4 3.6* 3.0*  NEUTROABS 1.1*  --   --   --   --   --   HGB 9.9* 10.4* 11.1* 10.3* 10.2* 9.9*  HCT 30.7* 32.7* 33.8* 32.1* 30.8* 30.0*  MCV 114.6* 114.7* 113.4* 112.6* 111.6* 111.9*  PLT 133* 140* 144* 129* 137* 137*   Basic Metabolic Panel: Recent Labs  Lab 12/09/22 1223 12/10/22 0347 12/11/22 0330 12/12/22 0426 12/13/22 0747  NA 134* 135 132* 132* 134*  K 3.8 4.0 3.8 4.3 4.7  CL 101 102 100 102 102  CO2 26 24 27  21* 25  GLUCOSE 239* 210* 224* 173* 208*  BUN 17 20 23 23 22   CREATININE 1.07 1.19 1.19 1.22 1.14  CALCIUM 8.5* 8.7* 8.4* 8.4* 8.9  MG 2.1 2.2 2.2 2.3 2.2   Liver Function Tests: Recent Labs  Lab 12/08/22 1440  AST 27  ALT 27  ALKPHOS 51  BILITOT 0.8  PROT 7.3  ALBUMIN 3.7   CBG: Recent Labs  Lab 12/13/22 1157 12/13/22 1610 12/13/22 2105 12/14/22 0753 12/14/22 1147  GLUCAP 242* 258* 230* 205* 178*    Discharge time spent: greater than 30 minutes.  Signed: Enedina Finner, MD [facility] 12/14/2022

## 2022-12-15 ENCOUNTER — Telehealth: Payer: Self-pay | Admitting: Nurse Practitioner

## 2022-12-15 ENCOUNTER — Telehealth: Payer: Self-pay | Admitting: Medical

## 2022-12-15 NOTE — Telephone Encounter (Signed)
-----   Message from Cadence David Stall, PA-C sent at 12/14/2022  7:30 AM EDT ----- Regarding: hosp follow-up Pt needs hosp follow-up in 2-3 weeks. thanks

## 2022-12-15 NOTE — Telephone Encounter (Signed)
Tried to call pt for 2 to 3 week follow up but answering machine is full

## 2022-12-16 NOTE — Telephone Encounter (Signed)
Already made schedule appointment.

## 2023-01-03 ENCOUNTER — Other Ambulatory Visit: Payer: Self-pay | Admitting: Medical

## 2023-01-08 ENCOUNTER — Encounter: Payer: Self-pay | Admitting: Internal Medicine

## 2023-01-20 ENCOUNTER — Inpatient Hospital Stay: Payer: Medicare HMO | Attending: Oncology

## 2023-01-20 DIAGNOSIS — Z7982 Long term (current) use of aspirin: Secondary | ICD-10-CM | POA: Insufficient documentation

## 2023-01-20 DIAGNOSIS — E785 Hyperlipidemia, unspecified: Secondary | ICD-10-CM | POA: Insufficient documentation

## 2023-01-20 DIAGNOSIS — I480 Paroxysmal atrial fibrillation: Secondary | ICD-10-CM | POA: Diagnosis not present

## 2023-01-20 DIAGNOSIS — I2510 Atherosclerotic heart disease of native coronary artery without angina pectoris: Secondary | ICD-10-CM | POA: Diagnosis not present

## 2023-01-20 DIAGNOSIS — R5383 Other fatigue: Secondary | ICD-10-CM | POA: Diagnosis not present

## 2023-01-20 DIAGNOSIS — E119 Type 2 diabetes mellitus without complications: Secondary | ICD-10-CM | POA: Insufficient documentation

## 2023-01-20 DIAGNOSIS — Z79899 Other long term (current) drug therapy: Secondary | ICD-10-CM | POA: Insufficient documentation

## 2023-01-20 DIAGNOSIS — R079 Chest pain, unspecified: Secondary | ICD-10-CM | POA: Diagnosis not present

## 2023-01-20 DIAGNOSIS — I110 Hypertensive heart disease with heart failure: Secondary | ICD-10-CM | POA: Insufficient documentation

## 2023-01-20 DIAGNOSIS — Z87442 Personal history of urinary calculi: Secondary | ICD-10-CM | POA: Insufficient documentation

## 2023-01-20 DIAGNOSIS — I5032 Chronic diastolic (congestive) heart failure: Secondary | ICD-10-CM | POA: Insufficient documentation

## 2023-01-20 DIAGNOSIS — Z8673 Personal history of transient ischemic attack (TIA), and cerebral infarction without residual deficits: Secondary | ICD-10-CM | POA: Insufficient documentation

## 2023-01-20 DIAGNOSIS — Z87891 Personal history of nicotine dependence: Secondary | ICD-10-CM | POA: Diagnosis not present

## 2023-01-20 DIAGNOSIS — Z7901 Long term (current) use of anticoagulants: Secondary | ICD-10-CM | POA: Insufficient documentation

## 2023-01-20 DIAGNOSIS — D469 Myelodysplastic syndrome, unspecified: Secondary | ICD-10-CM | POA: Insufficient documentation

## 2023-01-20 DIAGNOSIS — G4730 Sleep apnea, unspecified: Secondary | ICD-10-CM | POA: Diagnosis not present

## 2023-01-20 DIAGNOSIS — Z794 Long term (current) use of insulin: Secondary | ICD-10-CM | POA: Diagnosis not present

## 2023-01-20 LAB — CBC WITH DIFFERENTIAL (CANCER CENTER ONLY)
Abs Immature Granulocytes: 0.04 10*3/uL (ref 0.00–0.07)
Basophils Absolute: 0 10*3/uL (ref 0.0–0.1)
Basophils Relative: 1 %
Eosinophils Absolute: 0 10*3/uL (ref 0.0–0.5)
Eosinophils Relative: 1 %
HCT: 31.7 % — ABNORMAL LOW (ref 39.0–52.0)
Hemoglobin: 10.5 g/dL — ABNORMAL LOW (ref 13.0–17.0)
Immature Granulocytes: 1 %
Lymphocytes Relative: 37 %
Lymphs Abs: 1.1 10*3/uL (ref 0.7–4.0)
MCH: 36.8 pg — ABNORMAL HIGH (ref 26.0–34.0)
MCHC: 33.1 g/dL (ref 30.0–36.0)
MCV: 111.2 fL — ABNORMAL HIGH (ref 80.0–100.0)
Monocytes Absolute: 0.3 10*3/uL (ref 0.1–1.0)
Monocytes Relative: 9 %
Neutro Abs: 1.5 10*3/uL — ABNORMAL LOW (ref 1.7–7.7)
Neutrophils Relative %: 51 %
Platelet Count: 137 10*3/uL — ABNORMAL LOW (ref 150–400)
RBC: 2.85 MIL/uL — ABNORMAL LOW (ref 4.22–5.81)
RDW: 15.1 % (ref 11.5–15.5)
WBC Count: 3 10*3/uL — ABNORMAL LOW (ref 4.0–10.5)
nRBC: 0 % (ref 0.0–0.2)

## 2023-01-20 LAB — IRON AND TIBC
Iron: 97 ug/dL (ref 45–182)
Saturation Ratios: 33 % (ref 17.9–39.5)
TIBC: 298 ug/dL (ref 250–450)
UIBC: 201 ug/dL

## 2023-01-20 LAB — VITAMIN B12: Vitamin B-12: 3403 pg/mL — ABNORMAL HIGH (ref 180–914)

## 2023-01-20 LAB — FERRITIN: Ferritin: 160 ng/mL (ref 24–336)

## 2023-01-23 ENCOUNTER — Inpatient Hospital Stay (HOSPITAL_BASED_OUTPATIENT_CLINIC_OR_DEPARTMENT_OTHER): Payer: Medicare HMO | Admitting: Oncology

## 2023-01-23 ENCOUNTER — Encounter: Payer: Self-pay | Admitting: Oncology

## 2023-01-23 VITALS — BP 125/52 | HR 71 | Temp 96.7°F | Resp 18 | Wt 260.1 lb

## 2023-01-23 DIAGNOSIS — R5383 Other fatigue: Secondary | ICD-10-CM

## 2023-01-23 DIAGNOSIS — D469 Myelodysplastic syndrome, unspecified: Secondary | ICD-10-CM

## 2023-01-23 NOTE — Assessment & Plan Note (Signed)
Multifactorial, due to his medical problems. Out of proportion to his current hemoglobin level.  Normal TSH Discussed with patient that fatigue may be secondary to other etiologies. B12 has improved. Stop B12  .

## 2023-01-23 NOTE — Assessment & Plan Note (Addendum)
ZRSR2 mutation.MDS-IB-1 [facility] Dr. Zenaida Niece Deventer's notes were reviewed.  I agree with initiating EPO therapy if his hemoglobin is <=8 or <=9 with evidence of myocardium ischemia.  Labs are reviewed and discussed with patient. Lab Results  Component Value Date   HGB 10.5 (L) 01/20/2023   FERRITIN 160 01/20/2023    Repeat cbc today showed a hemoglobin >10, no need to initiate therapy for now.  Continue close monitor, repeat cbc in 6 weeks.

## 2023-01-24 ENCOUNTER — Encounter: Payer: Self-pay | Admitting: Oncology

## 2023-01-24 NOTE — Progress Notes (Signed)
Hematology/Oncology Progress note [phone] [phone]       ASSESSMENT & PLAN:   Myelodysplastic syndrome (HCC) ZRSR2 mutation.MDS-IB-1 [facility] Dr. Zenaida Niece Deventer's notes were reviewed.  I agree with initiating EPO therapy if his hemoglobin is <=8 or <=9 with evidence of myocardium ischemia.  Labs are reviewed and discussed with patient. Lab Results  Component Value Date   HGB 10.5 (L) 01/20/2023   FERRITIN 160 01/20/2023    Repeat cbc today showed a hemoglobin >10, no need to initiate therapy for now.  Continue close monitor, repeat cbc in 6 weeks follow up in 3 months.    Fatigue Multifactorial, due to his medical problems. Out of proportion to his current hemoglobin level.  Normal TSH Discussed with patient that fatigue may be secondary to other etiologies. B12 has improved. Stop B12  .     Orders Placed This Encounter  Procedures   CBC with Differential (Cancer Center Only)    Standing Status:   Future    Standing Expiration Date:   01/23/2024   CBC with Differential (Cancer Center Only)    Standing Status:   Future    Standing Expiration Date:   01/23/2024   CMP (Cancer Center only)    Standing Status:   Future    Standing Expiration Date:   01/23/2024   Retic Panel    Standing Status:   Future    Standing Expiration Date:   01/23/2024   Follow up per LOS All questions were answered. The patient knows to call the clinic with any problems, questions or concerns.  [name], MD, PhD [facility] Hematology Oncology 01/23/2023     CHIEF COMPLAINTS/PURPOSE OF CONSULTATION:  MDS  HISTORY OF PRESENTING ILLNESS:  Jonathan Cross. 77 y.o. male presents to establish care for MDS I have reviewed his chart and materials related to his cancer extensively and collaborated history with the patient. Summary of oncologic history is as follows: Oncology History  Myelodysplastic syndrome (HCC)  05/21/2022 Bone Marrow Biopsy   Bone marrow biopsy  showed  - - Hypercellular bone marrow (70%) with trilineage hematopoiesis, megakaryocytic dyspoiesis, and 6% blasts by manual aspirate differential (see Comment)  The current findings of pancytopenia, hypercellular bone marrow with megakaryocyte dyspoiesis (>10%) and increased blasts (6%) in addition to the presence of a clonal molecular abnormality (ZRSRS mutation with VAF 50.7) are consistent with myelodysplastic neoplasm with increased blasts (MDS-IB-1) in the absence of MDS or AML defining genetic abnormality.    06/12/2022 Initial Diagnosis   Myelodysplastic syndrome [facility])  Patient follows up with [facility] Dr. Leotis Pain.  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 See above.  Using the IPSS-M, he has low risk disease with a median leukemia free survival of about 6 years.  There is concern of MDS may add to his possible angina.  Dr. Leotis Pain recommends treatment  1) IF he has evidence of cardiac ischemia that has been maximally medically managed AND a hemoglobin < 9 g/dL 2 ) If his hemoglobin was < 8.0   Darbepoietin/GCSF per Darcel Bayley et al Dewayne Hatch Hem 2013 May; 92(5) Lucile Crater 2013 May; 92(5) https://link.springer.com/article/10.1007%2Fs00277-(321) 752-0405-4)  Ferritin: Treat with iron if ferritin is < 50 Treatment  Darbepoetin 500 mcg every 2 weeks CR = Hgb > 11.5 PR = Inc in Hgb of 1.5 If no response in 12 weeks, add G-CSF 300 mcg two times a  week to maintain WBC counts between 5 and 10  If no response at 24 weeks, discontinue trial If hemoglobin is > 12, hold darbepoetin until hemoglobin is < 11. Resume darbepoetin at every 3 weeks. Continue to increase interval by 1 week if hemoglobin increases to > 12 again.      His hemoglobin was 8 on 05/01/22, and he received one unit of PRBC transfusion.  He is accompanied by his daughter today.  + chronic  fatigue, intermittent chest pain, stable, not worse.    INTERVAL HISTORY Jonathan Cross. is a 77 y.o. male who has above history reviewed by me today presents for follow up visit for MDS, anemia.   - Fatigue, he reports "increased weakness and tiredness.  Patient continues to have intermittent chest discomfort.  Recent ER visit for chest pain, per cardiology atypical chest pain. Troponins are flat, EKG is nonischemic.   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 04/2022 Echo: EF 60-65%, no rwma, GrII DD, nl RV fxn, mildly dil LA, mild MR, nl fxn'ing biopros AoV.   Coronary artery disease    a. 01/2016 CABG x 2: LIMA->LAD, VG->OM; b. 05/2021 MV: small, mild, rev apical lateral and apical inf defects ->subtle ischemia vs artifact, EF 55-65%-->low risk.   CVA, old, disturbances of vision    Diabetes (HCC)    H/O aortic valve replacement    a. 01/2016 s/p bioprosthetic AVR @ [facility] for Severe AS; b. 05/2021 Echo: EF 55-60%, nl fxn of bioprosthetic AVR; c. 04/2022 Echo: EF 60-65%, nl fxn'ing AoV.   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 CHA2DS2VASc = 5-->eliquis/amio.   Retinal vascular occlusion of left eye    Several years ago   Sleep apnea    BiPAP   Vertigo    Wears dentures    full upper and lower.  Lower "broken"    SURGICAL HISTORY: Past Surgical History:  Procedure Laterality Date   AORTIC VALVE REPLACEMENT  2017   [facility], bioprosthetic   APPENDECTOMY     BONE MARROW BIOPSY     05/2022   CARDIAC VALVE REPLACEMENT     CATARACT EXTRACTION W/PHACO Right 11/12/2022   Procedure: CATARACT EXTRACTION PHACO AND INTRAOCULAR LENS PLACEMENT (IOC) RIGHT DIABETIC MALYUGIN  13.49   01:06.1;  Surgeon: Lockie Mola, MD;  Location: [facility];  Service: Ophthalmology;  Laterality: Right;  Diabetic   CORONARY ARTERY BYPASS GRAFT  2017   [facility] - LIMA-LAD and SVG-OM   CYSTOSCOPY W/ RETROGRADES Right 05/27/2019   Procedure: CYSTOSCOPY WITH RETROGRADE PYELOGRAM;  Surgeon: Sondra Come, MD;  Location: [facility];  Service: Urology;  Laterality: Right;   CYSTOSCOPY W/ RETROGRADES Right 07/11/2019   Procedure: CYSTOSCOPY WITH RETROGRADE PYELOGRAM;  Surgeon: Sondra Come, MD;  Location: [facility];  Service: Urology;  Laterality: Right;   CYSTOSCOPY/URETEROSCOPY/HOLMIUM LASER/STENT PLACEMENT Right 05/27/2019   Procedure: CYSTOSCOPY/URETEROSCOPY/HOLMIUM LASER/STENT PLACEMENT;  Surgeon: Sondra Come, MD;  Location: [facility];  Service: Urology;  Laterality: Right;   CYSTOSCOPY/URETEROSCOPY/HOLMIUM LASER/STENT PLACEMENT Right 06/17/2019   Procedure: CYSTOSCOPY/URETEROSCOPY/HOLMIUM LASER/STENT Exchange;  Surgeon: Sondra Come, MD;  Location: [facility];  Service: Urology;  Laterality:  Right;   CYSTOSCOPY/URETEROSCOPY/HOLMIUM LASER/STENT PLACEMENT Right 07/11/2019   Procedure: CYSTOSCOPY/URETEROSCOPY/HOLMIUM LASER/STENT Exchange;  Surgeon: Sondra Come, MD;  Location: [facility];  Service: Urology;  Laterality: Right;   RIGHT HEART CATH AND CORONARY/GRAFT ANGIOGRAPHY Bilateral 06/17/2022   Procedure: RIGHT HEART CATH AND CORONARY/GRAFT ANGIOGRAPHY;  Surgeon: Yvonne Kendall, MD;  Location: [facility];  Service: Cardiovascular;  Laterality: Bilateral;   STONE EXTRACTION WITH BASKET Right 07/11/2019   Procedure: STONE EXTRACTION WITH BASKET;  Surgeon: Sondra Come, MD;  Location: [facility];  Service: Urology;  Laterality: Right;   TEE WITHOUT CARDIOVERSION N/A 04/21/2019   Procedure: TRANSESOPHAGEAL ECHOCARDIOGRAM (TEE);  Surgeon: Antonieta Iba, MD;  Location: [facility];  Service: Cardiovascular;  Laterality: N/A;   TEE WITHOUT CARDIOVERSION N/A  07/04/2019   Procedure: TRANSESOPHAGEAL ECHOCARDIOGRAM (TEE);  Surgeon: Iran Ouch, MD;  Location: [facility];  Service: Cardiovascular;  Laterality: N/A;   TEE WITHOUT CARDIOVERSION N/A 08/17/2019   Procedure: TRANSESOPHAGEAL ECHOCARDIOGRAM (TEE);  Surgeon: Debbe Odea, MD;  Location: [facility];  Service: Cardiovascular;  Laterality: N/A;   TEE WITHOUT CARDIOVERSION N/A 11/15/2019   Procedure: TRANSESOPHAGEAL ECHOCARDIOGRAM (TEE);  Surgeon: Yvonne Kendall, MD;  Location: [facility];  Service: Cardiovascular;  Laterality: N/A;   TONSILLECTOMY      SOCIAL HISTORY: Social History   Socioeconomic History   Marital status: Married    Spouse name: Not on file   Number of children: Not on file   Years of education: Not on file   Highest education level: Not on file  Occupational History   Not on file  Tobacco Use   Smoking status: Former    Current packs/day: 0.00    Types: Cigarettes    Quit date: 1970    Years since quitting: 54.5   Smokeless tobacco: Never  Vaping Use   Vaping status: Never Used  Substance and Sexual Activity   Alcohol use: Never   Drug use: Never   Sexual activity: Not Currently  Other Topics Concern   Not on file  Social History Narrative   Not on file   Social Determinants of Health   Financial Resource Strain: Medium Risk (10/16/2021)   Received [facility], [facility]   Overall Financial Resource Strain (CARDIA)    Difficulty of Paying Living Expenses: Somewhat hard  Food Insecurity: No Food Insecurity (12/09/2022)   Hunger Vital Sign    Worried About Running Out of Food in the Last Year: Never true    Ran Out of Food in the Last Year: Never true  Transportation Needs: No Transportation Needs (12/09/2022)   PRAPARE - Administrator, Civil Service (Medical): No    Lack of Transportation (Non-Medical): No  Physical Activity: Inactive (04/24/2021)   Received from [facility], [facility]   Exercise Vital  Sign    Days of Exercise per Week: 0 days    Minutes of Exercise per Session: 0 min  Stress: No Stress Concern Present (04/24/2021)   Received from [facility], [facility] - Occupational Stress Questionnaire    Feeling of Stress : Not at all  Social Connections: Not on file  Intimate Partner Violence: Not At Risk (12/09/2022)   Humiliation, Afraid, Rape, and Kick questionnaire    Fear of Current or Ex-Partner: No    Emotionally Abused: No    Physically Abused: No    Sexually Abused: No    FAMILY HISTORY:  Family History  Problem Relation Age of Onset   Heart attack Father 52   Heart attack Sister     ALLERGIES:  is allergic to metformin.  MEDICATIONS:  Current Outpatient Medications  Medication Sig Dispense Refill   amiodarone (PACERONE) 200 MG tablet Take 1 tablet by mouth once daily (Patient taking differently: Take 200 mg by mouth daily.) 90 tablet 1   aspirin EC 81 MG tablet Take 81 mg by mouth daily. Swallow whole.     atorvastatin (LIPITOR) 80 MG tablet Take 1 tablet (80 mg total) by mouth every evening. 90 tablet 3   furosemide (LASIX) 40 MG tablet Take 1 tablet (40 mg total) by mouth 2 (two) times daily. 180 tablet 3   insulin NPH-regular Human (70-30) 100 UNIT/ML injection Inject 130 Units into the skin 2 (two) times daily.     isosorbide mononitrate (IMDUR) 30 MG 24 hr tablet Take 0.5 tablets (15 mg total) by mouth daily. 30 tablet 2   Magnesium Oxide 400 MG CAPS Take 1 capsule (400 mg total) by mouth daily. 90 capsule 3   metoprolol succinate (TOPROL-XL) 25 MG 24 hr tablet Take 1 tablet (25 mg total) by mouth 2 (two) times daily. 30 tablet 1   nitroGLYCERIN (NITROSTAT) 0.4 MG SL tablet Place 1 tablet (0.4 mg total) under the tongue every 5 (five) minutes x 3 doses as needed for chest pain. 30 tablet 1   omeprazole (PRILOSEC) 20 MG capsule Take 40 mg by mouth daily.     ranolazine (RANEXA) 500 MG 12 hr tablet Take 1  tablet by mouth twice daily 60 tablet 0   tamsulosin (FLOMAX) 0.4 MG CAPS capsule Take 0.4 mg by mouth daily.     vitamin B-12 (CYANOCOBALAMIN) 500 MCG tablet Take 1 tablet (500 mcg total) by mouth daily. 90 tablet 1   apixaban (ELIQUIS) 5 MG TABS tablet Take 5 mg by mouth 2 (two) times daily. (Patient not taking: Reported on 01/23/2023)     No current facility-administered medications for this visit.    Review of Systems  Constitutional:  Positive for fatigue. Negative for appetite change, chills and fever.  HENT:   Negative for hearing loss and voice change.   Eyes:  Negative for eye problems and icterus.  Respiratory:  Positive for chest tightness. Negative for cough and shortness of breath.   Cardiovascular:  Negative for chest pain and leg swelling.  Gastrointestinal:  Negative for abdominal distention and abdominal pain.  Endocrine: Negative for hot flashes.  Genitourinary:  Negative for difficulty urinating, dysuria and frequency.   Musculoskeletal:  Negative for arthralgias.  Skin:  Negative for itching and rash.  Neurological:  Negative for light-headedness and numbness.  Hematological:  Negative for adenopathy. Does not bruise/bleed easily.  Psychiatric/Behavioral:  Negative for confusion.      PHYSICAL EXAMINATION:  Vitals:   01/23/23 1121  BP: (!) 125/52  Pulse: 71  Resp: 18  Temp: (!) 96.7 F (35.9 C)  SpO2: 98%   Filed Weights   01/23/23 1121  Weight: 260 lb 1.6 oz (118 kg)    Physical Exam Constitutional:      General: He is not in acute distress.    Appearance: He is obese. He is not diaphoretic.  HENT:     Head: Normocephalic and atraumatic.  Eyes:     General: No scleral icterus.    Pupils: Pupils are equal, round, and reactive to light.  Cardiovascular:     Rate and Rhythm: Normal rate.  Pulmonary:     Effort: Pulmonary effort is normal. No respiratory distress.  Abdominal:     General: Bowel sounds are normal. There is no distension.      Palpations: Abdomen is soft.  Musculoskeletal:        General: Normal range of motion.     Cervical back: Normal range of motion.  Skin:    General: Skin is warm and dry.     Findings: No erythema.     Comments: Multiple scratches on right lower extremity.  Neurological:     Mental Status: He is alert and oriented to person, place, and time. Mental status is at baseline.     Cranial Nerves: No cranial nerve deficit.     Motor: No abnormal muscle tone.     Coordination: Coordination normal.  Psychiatric:        Mood and Affect: Affect normal.      LABORATORY DATA:  I have reviewed the data as listed    Latest Ref Rng & Units 01/20/2023   11:11 AM 12/13/2022    7:47 AM 12/12/2022    6:20 AM  CBC  WBC 4.0 - 10.5 K/uL 3.0  3.0  3.6   Hemoglobin 13.0 - 17.0 g/dL 16.1  9.9  09.6   Hematocrit 39.0 - 52.0 % 31.7  30.0  30.8   Platelets 150 - 400 K/uL 137  137  137       Latest Ref Rng & Units 12/13/2022    7:47 AM 12/12/2022    4:26 AM 12/11/2022    3:30 AM  CMP  Glucose 70 - 99 mg/dL 045  409  811   BUN 8 - 23 mg/dL 22  23  23    Creatinine 0.61 - 1.24 mg/dL 9.14  7.82  9.56   Sodium 135 - 145 mmol/L 134  132  132   Potassium 3.5 - 5.1 mmol/L 4.7  4.3  3.8   Chloride 98 - 111 mmol/L 102  102  100   CO2 22 - 32 mmol/L 25  21  27    Calcium 8.9 - 10.3 mg/dL 8.9  8.4  8.4      RADIOGRAPHIC STUDIES: I have personally reviewed the radiological images as listed and agreed with the findings in the report. No results found.

## 2023-02-06 ENCOUNTER — Other Ambulatory Visit: Payer: Self-pay | Admitting: Nurse Practitioner

## 2023-02-06 ENCOUNTER — Other Ambulatory Visit: Payer: Self-pay | Admitting: Internal Medicine

## 2023-02-06 NOTE — Telephone Encounter (Signed)
Does Dr. Okey Dupre want to continue refilling Magnesium?

## 2023-02-18 ENCOUNTER — Encounter: Payer: Self-pay | Admitting: Internal Medicine

## 2023-02-19 NOTE — Telephone Encounter (Signed)
Please see if we can provide Mr. Mear with some samples until he is able to get his prescription filled.  He and his daughter should return the patient assistance forms to the manufacturer as soon as possible.  Yvonne Kendall, MD [facility]

## 2023-02-24 ENCOUNTER — Encounter: Payer: Self-pay | Admitting: Nurse Practitioner

## 2023-02-25 ENCOUNTER — Other Ambulatory Visit: Payer: Self-pay | Admitting: Medical

## 2023-02-25 ENCOUNTER — Ambulatory Visit: Payer: Medicare HMO | Admitting: Nurse Practitioner

## 2023-03-04 ENCOUNTER — Other Ambulatory Visit: Payer: Self-pay

## 2023-03-04 ENCOUNTER — Emergency Department: Payer: Medicare HMO

## 2023-03-04 ENCOUNTER — Emergency Department
Admission: EM | Admit: 2023-03-04 | Discharge: 2023-03-04 | Disposition: A | Discharge status: Home / Self Care | Payer: Medicare HMO | Attending: Emergency Medicine | Admitting: Emergency Medicine

## 2023-03-04 ENCOUNTER — Telehealth: Payer: Self-pay | Admitting: Internal Medicine

## 2023-03-04 DIAGNOSIS — Z20822 Contact with and (suspected) exposure to covid-19: Secondary | ICD-10-CM | POA: Diagnosis not present

## 2023-03-04 DIAGNOSIS — R079 Chest pain, unspecified: Secondary | ICD-10-CM | POA: Diagnosis not present

## 2023-03-04 DIAGNOSIS — R0602 Shortness of breath: Secondary | ICD-10-CM | POA: Diagnosis not present

## 2023-03-04 LAB — BASIC METABOLIC PANEL
Anion gap: 6 (ref 5–15)
BUN: 16 mg/dL (ref 8–23)
CO2: 22 mmol/L (ref 22–32)
Calcium: 8.2 mg/dL — ABNORMAL LOW (ref 8.9–10.3)
Chloride: 103 mmol/L (ref 98–111)
Creatinine, Ser: 1 mg/dL (ref 0.61–1.24)
GFR, Estimated: >60 mL/min (ref >60)
Glucose, Bld: 247 mg/dL — ABNORMAL HIGH (ref 70–99)
Potassium: 4.6 mmol/L (ref 3.5–5.1)
Sodium: 131 mmol/L — ABNORMAL LOW (ref 135–145)

## 2023-03-04 LAB — CBC
HCT: 27.8 % — ABNORMAL LOW (ref 39.0–52.0)
Hemoglobin: 8.6 g/dL — ABNORMAL LOW (ref 13.0–17.0)
MCH: 36 pg — ABNORMAL HIGH (ref 26.0–34.0)
MCHC: 30.9 g/dL (ref 30.0–36.0)
MCV: 116.3 fL — ABNORMAL HIGH (ref 80.0–100.0)
Platelets: 96 10*3/uL — ABNORMAL LOW (ref 150–400)
RBC: 2.39 MIL/uL — ABNORMAL LOW (ref 4.22–5.81)
RDW: 15.9 % — ABNORMAL HIGH (ref 11.5–15.5)
WBC: 2.1 10*3/uL — ABNORMAL LOW (ref 4.0–10.5)
nRBC: 0 % (ref 0.0–0.2)

## 2023-03-04 LAB — SARS CORONAVIRUS 2 BY RT PCR: SARS Coronavirus 2 by RT PCR: NEGATIVE

## 2023-03-04 LAB — TROPONIN I (HIGH SENSITIVITY)
Troponin I (High Sensitivity): 16 ng/L (ref <18)
Troponin I (High Sensitivity): 16 ng/L (ref <18)

## 2023-03-04 LAB — PROCALCITONIN: Procalcitonin: <0.1 ng/mL

## 2023-03-04 MED ORDER — IOHEXOL 350 MG/ML SOLN
75.0000 mL | Freq: Once | INTRAVENOUS | Status: AC | PRN
  Administered 2023-03-04: 75 mL via INTRAVENOUS

## 2023-03-04 NOTE — Telephone Encounter (Signed)
Patient's home care nurse called and stated that the patient was having some pain in his back that radiated to the chest. Patient had not had his oxygen on because the machine was inoperative. The machine was fixed and he is now wearing it and he is currently on his second nitroGLYCERIN (NITROSTAT) 0.4 MG SL tablet. Patient stated that his pain was down to a 4/5 and has some dizziness. Patient is not diaphoretic, short of breath, nauseous, vomiting, displaying chills or running a fever. Patient's home nurse suggested taking him to ED to be evaluated but patient refused. Patient's nurse stated she would take him to the hospital

## 2023-03-04 NOTE — Telephone Encounter (Signed)
  Pt c/o of Chest Pain: STAT if active CP, including tightness, pressure, jaw pain, radiating pain to shoulder/upper arm/back, CP unrelieved by Nitro. Symptoms reported of SOB, nausea, vomiting, sweating.  1. Are you having CP right now?   Yes   2. Are you experiencing any other symptoms (ex. SOB, nausea, vomiting, sweating)?   SOB, no sweating  3. Is your CP continuous or coming and going?  Coming and going  4. Have you taken Nitroglycerin?   Yes.  Two so far  5. How long have you been experiencing CP?  About 20 minutes ago   6. If NO CP at time of call then end call with telling Pt to call back or call 911 if Chest pain returns prior to return call from triage team.   Daughter stated patient is having chest tightness.  Daughter stated patient has not been taking his Eliquis as he as been out of this medication.  Daughter stated can also his home health nurse Harrell Lark, [phone].

## 2023-03-04 NOTE — ED Provider Notes (Signed)
[facility] Provider Note    Event Date/Time   First MD Initiated Contact with Patient 03/04/23 1813     (approximate)   History   Chest Pain   HPI  Jonathan Rindlisbacher Jacarie Bouknight. is a 77 y.o. male who presents to the emergency department today because of concerns for chest pain.  Patient states he has been having issues with chest pain for a number of years.  He says that it usually causes shortness of breath.  Today he had another 1 of these episodes.  They can happen up to 2-3 times a day.  However today a new home health nurse was there.  When she saw the patient having the chest pain and the shortness of breath she called 911.     Physical Exam   Triage Vital Signs: ED Triage Vitals [03/04/23 1529]  Encounter Vitals Group     BP (!) 128/53     Systolic BP Percentile      Diastolic BP Percentile      Pulse Rate 66     Resp 18     Temp 98 F (36.7 C)     Temp Source Oral     SpO2 95 %     Weight      Height      Head Circumference      Peak Flow      Pain Score 0     Pain Loc      Pain Education      Exclude from Growth Chart     Most recent vital signs: Vitals:   03/04/23 1529  BP: (!) 128/53  Pulse: 66  Resp: 18  Temp: 98 F (36.7 C)  SpO2: 95%   General: Awake, alert, oriented. CV:  Good peripheral perfusion. Regular rate and rhythm. Resp:  Normal effort. Lungs clear. Abd:  No distention.    ED Results / Procedures / Treatments   Labs (all labs ordered are listed, but only abnormal results are displayed) Labs Reviewed  BASIC METABOLIC PANEL - Abnormal; Notable for the following components:      Result Value   Sodium 131 (*)    Glucose, Bld 247 (*)    Calcium 8.2 (*)    All other components within normal limits  CBC - Abnormal; Notable for the following components:   WBC 2.1 (*)    RBC 2.39 (*)    Hemoglobin 8.6 (*)    HCT 27.8 (*)    MCV 116.3 (*)    MCH 36.0 (*)    RDW 15.9 (*)    Platelets 96 (*)    All other  components within normal limits  TROPONIN I (HIGH SENSITIVITY)  TROPONIN I (HIGH SENSITIVITY)     EKG  I, Phineas Semen, attending physician, personally viewed and interpreted this EKG  EKG Time: 1529 Rate: 66 Rhythm: atrial flutter Axis: left axis deviation Intervals: qtc 496 QRS: incomplete RBBB ST changes: no st elevation Impression: abnormal ekg   RADIOLOGY I independently interpreted and visualized the CXR. My interpretation: Lower lobe pneumonia Radiology interpretation:  IMPRESSION:  1. Bilateral lower lung zone opacities, concerning for multilobar  pneumonia.  2. Probable bilateral pleural effusions.      PROCEDURES:  Critical Care performed: No    MEDICATIONS ORDERED IN ED: Medications - No data to display   IMPRESSION / MDM / ASSESSMENT AND PLAN / ED COURSE  I reviewed the triage vital signs and the nursing notes.  Differential diagnosis includes, but is not limited to, pneumonia, ACS, PE  Patient's presentation is most consistent with acute presentation with potential threat to life or bodily function.   The patient is on the cardiac monitor to evaluate for evidence of arrhythmia and/or significant heart rate changes.  Patient presented to the emergency department today because of concerns for episode of chest pain and shortness of breath.  Patient states he has been having similar episodes for quite some time.  Did have recent admission at outside hospital for pneumonia.  Chest x-ray here does show findings concerning for possible pneumonia however could potentially be residual from previous diagnosis.  Patient is afebrile here.  Did add on a procalcitonin which was negative.  Did obtain a CT to evaluate for possible PEs.  While this was negative it did raise concern for possible COVID.  Will check COVID.  Do think patient likely will be able to be discharged given he states he is essentially at his baseline.       FINAL CLINICAL IMPRESSION(S) / ED DIAGNOSES   Final diagnoses:  Nonspecific chest pain      Note:  This document was prepared using Dragon voice recognition software and may include unintentional dictation errors.    Phineas Semen, MD 03/04/23 [phone]

## 2023-03-04 NOTE — ED Notes (Signed)
First Nurse Note: Patient to ED via [facility] from home for CP that radiates into left shoulder. Also feeling lightheaded. Took 2 nitroglycerin prior to EMS arrival. EMS gave 324 ASA. 20 L AC  Wears 1L [place] PRN per patient

## 2023-03-04 NOTE — Discharge Instructions (Signed)
Please seek medical attention for any high fevers, chest pain, shortness of breath, change in behavior, persistent vomiting, bloody stool or any other new or concerning symptoms.  

## 2023-03-04 NOTE — ED Triage Notes (Signed)
Pt to ED via [facility] from home. Pt reports intermittent CP that radiates to left shoulder. Pt also reports dizziness. Pt had 2 SL nitroglycerin pills and 324mg  ASA PTA. Pt reports uses 1L [place] PRN. 20g LAC. Pt denies pain currently. Pt reports is suppose Eliquis but has not been taking it for several months due to cost.

## 2023-03-06 ENCOUNTER — Inpatient Hospital Stay: Payer: Medicare HMO | Attending: Oncology

## 2023-03-10 ENCOUNTER — Other Ambulatory Visit: Payer: Self-pay | Admitting: Nurse Practitioner

## 2023-04-10 ENCOUNTER — Ambulatory Visit: Payer: Medicare HMO | Attending: Nurse Practitioner | Admitting: Nurse Practitioner

## 2023-04-10 ENCOUNTER — Encounter: Payer: Self-pay | Admitting: Nurse Practitioner

## 2023-04-10 VITALS — BP 120/58 | HR 81 | Ht 69.0 in | Wt 262.5 lb

## 2023-04-10 DIAGNOSIS — D469 Myelodysplastic syndrome, unspecified: Secondary | ICD-10-CM | POA: Diagnosis not present

## 2023-04-10 DIAGNOSIS — I25118 Atherosclerotic heart disease of native coronary artery with other forms of angina pectoris: Secondary | ICD-10-CM

## 2023-04-10 DIAGNOSIS — Z794 Long term (current) use of insulin: Secondary | ICD-10-CM

## 2023-04-10 DIAGNOSIS — I480 Paroxysmal atrial fibrillation: Secondary | ICD-10-CM

## 2023-04-10 DIAGNOSIS — Z952 Presence of prosthetic heart valve: Secondary | ICD-10-CM

## 2023-04-10 DIAGNOSIS — E119 Type 2 diabetes mellitus without complications: Secondary | ICD-10-CM

## 2023-04-10 DIAGNOSIS — E785 Hyperlipidemia, unspecified: Secondary | ICD-10-CM

## 2023-04-10 DIAGNOSIS — I350 Nonrheumatic aortic (valve) stenosis: Secondary | ICD-10-CM | POA: Diagnosis not present

## 2023-04-10 DIAGNOSIS — I10 Essential (primary) hypertension: Secondary | ICD-10-CM

## 2023-04-10 DIAGNOSIS — G4733 Obstructive sleep apnea (adult) (pediatric): Secondary | ICD-10-CM

## 2023-04-10 MED ORDER — FUROSEMIDE 40 MG PO TABS: 40.0000 mg | ORAL_TABLET | Freq: Two times a day (BID) | ORAL | 3 refills | Status: DC

## 2023-04-10 MED ORDER — RIVAROXABAN 20 MG PO TABS: 20.0000 mg | ORAL_TABLET | Freq: Every day | ORAL | 11 refills | Status: DC

## 2023-04-10 NOTE — Progress Notes (Signed)
Office Visit    Patient Name: Jonathan Cross. Date of Encounter: 04/10/2023  Primary Care Provider:  Pcp, No Primary Cardiologist:  Yvonne Kendall, MD  Chief Complaint    77 y.o. 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 04/2022 Echo: EF 60-65%, GrII DD; b. 06/2022 RHC: PA 50/20(30), PCWP 25; c. 11/2022 Echo: EF 60-65%, mild LVH, GrI DD. mildly reduced RV fxn, mild MR, nl fxn'ing bioprosthetic AoV; d. 01/2023 Echo: EF 60-65%, nl fxn'ing AoV, prob mild AS. Nl RV fxn.   Coronary artery disease    a. 01/2016 CABG x 2: LIMA->LAD, VG->OM; b. 05/2021 MV: small, mild, rev apical lateral and apical inf defects ->subtle ischemia vs artifact, EF 55-65%-->low risk; c. 06/2022 Cath: LM 20d, LAD 80ost, 60p/m, 40m, D1 mild dzs, LCX 50ost/p, OM1 mod dzs, RCA mild diff dzs, RPL1 75, VG->OM2 & LIMA->LAD patent--> Med Rx.   CVA, old, disturbances of vision    Diabetes (HCC)    H/O aortic valve replacement    a. 01/2016 s/p bioprosthetic AVR @ [facility] for Severe AS; b. 05/2021 Echo: EF 55-60%, nl fxn of bioprosthetic AVR; c. 04/2022 Echo: EF 60-65%, nl fxn'ing AoV; d. 11/2022 Echo: nl fxn'ing AoV; e. 01/2023 Echo: nl fxn'ing Aov, mean grad .   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 CHA2DS2VASc = 5-->eliquis/amio; b. 01/2023 Amio d/c'd due to  concern re: lung toxicity/ongoing dyspnea.   Retinal vascular occlusion of left eye    Several years ago   Sleep apnea    BiPAP   Vertigo    Wears dentures    full upper and lower.  Lower "broken"   Past Surgical History:  Procedure Laterality Date   AORTIC VALVE REPLACEMENT  2017   [facility], bioprosthetic   APPENDECTOMY     BONE MARROW BIOPSY     05/2022   CARDIAC VALVE REPLACEMENT     CATARACT EXTRACTION W/PHACO Right 11/12/2022   Procedure: CATARACT EXTRACTION PHACO AND INTRAOCULAR LENS PLACEMENT (IOC) RIGHT DIABETIC MALYUGIN  13.49  01:06.1;  Surgeon: Lockie Mola, MD;  Location: [facility];  Service: Ophthalmology;  Laterality: Right;  Diabetic   CORONARY ARTERY BYPASS GRAFT  2017   [facility] - LIMA-LAD and SVG-OM   CYSTOSCOPY W/ RETROGRADES Right 05/27/2019   Procedure: CYSTOSCOPY WITH RETROGRADE PYELOGRAM;  Surgeon: Sondra Come, MD;  Location: [facility];  Service: Urology;  Laterality: Right;   CYSTOSCOPY W/ RETROGRADES Right 07/11/2019   Procedure: CYSTOSCOPY WITH RETROGRADE PYELOGRAM;  Surgeon: Sondra Come, MD;  Location: [facility];  Service: Urology;  Laterality: Right;   CYSTOSCOPY/URETEROSCOPY/HOLMIUM LASER/STENT PLACEMENT Right 05/27/2019   Procedure: CYSTOSCOPY/URETEROSCOPY/HOLMIUM LASER/STENT PLACEMENT;  Surgeon: Sondra Come, MD;  Location: [facility];  Service: Urology;  Laterality: Right;   CYSTOSCOPY/URETEROSCOPY/HOLMIUM LASER/STENT PLACEMENT Right 06/17/2019   Procedure: CYSTOSCOPY/URETEROSCOPY/HOLMIUM LASER/STENT  Exchange;  Surgeon: Sondra Come, MD;  Location: [facility];  Service: Urology;  Laterality: Right;   CYSTOSCOPY/URETEROSCOPY/HOLMIUM LASER/STENT PLACEMENT Right 07/11/2019   Procedure: CYSTOSCOPY/URETEROSCOPY/HOLMIUM LASER/STENT Exchange;  Surgeon: Sondra Come, MD;  Location: [facility];  Service: Urology;  Laterality: Right;   RIGHT HEART CATH AND CORONARY/GRAFT ANGIOGRAPHY Bilateral 06/17/2022   Procedure: RIGHT HEART CATH AND  CORONARY/GRAFT ANGIOGRAPHY;  Surgeon: Yvonne Kendall, MD;  Location: [facility];  Service: Cardiovascular;  Laterality: Bilateral;   STONE EXTRACTION WITH BASKET Right 07/11/2019   Procedure: STONE EXTRACTION WITH BASKET;  Surgeon: Sondra Come, MD;  Location: [facility];  Service: Urology;  Laterality: Right;   TEE WITHOUT CARDIOVERSION N/A 04/21/2019   Procedure: TRANSESOPHAGEAL ECHOCARDIOGRAM (TEE);  Surgeon: Antonieta Iba, MD;  Location: [facility];  Service: Cardiovascular;  Laterality: N/A;   TEE WITHOUT CARDIOVERSION N/A 07/04/2019   Procedure: TRANSESOPHAGEAL ECHOCARDIOGRAM (TEE);  Surgeon: Iran Ouch, MD;  Location: [facility];  Service: Cardiovascular;  Laterality: N/A;   TEE WITHOUT CARDIOVERSION N/A 08/17/2019   Procedure: TRANSESOPHAGEAL ECHOCARDIOGRAM (TEE);  Surgeon: Debbe Odea, MD;  Location: [facility];  Service: Cardiovascular;  Laterality: N/A;   TEE WITHOUT CARDIOVERSION N/A 11/15/2019   Procedure: TRANSESOPHAGEAL ECHOCARDIOGRAM (TEE);  Surgeon: Yvonne Kendall, MD;  Location: [facility];  Service: Cardiovascular;  Laterality: N/A;   TONSILLECTOMY      Allergies  Allergies  Allergen Reactions   Metformin Diarrhea    Loose stools even with XR    History of Present Illness      77 y.o. y/o male with above past medical history following CAD status post CABG x2, severe aortic stenosis status post bioprosthetic aortic valve replacement, bacterial endocarditis, paroxysmal atrial fibrillation, hypertension, hyperlipidemia, type 2 diabetes mellitus, sleep apnea, and BPH.  He was previously followed at [facility] in the setting of severe aortic stenosis and underwent bioprosthetic aortic valve replacement two-vessel coronary artery bypass grafting in July 2017.  Between October 2020 in May 2021, he had multiple admissions related to fever, malaise, and Enterococcus faecalis bacteremia.  TEEs in October 2020 and December 2020 were negative for vegetation.  In  early 2021, he was found to have a mobile mass on his prosthetic aortic valve and was seen by infectious disease treated with appropriate antibiotics for 6 weeks in the outpatient setting.  Prior to PICC line removal, repeat TEE in May 2021 continued to show an echodensity that appeared less mobile but larger, measuring 1.1 x 0.6 cm.  Perivalvular abscess could not be excluded.  He was transferred to [facility] and underwent cardiac CT which showed evidence of a small mass in the leaflet of the noncoronary cusp, likely representing a healed vegetation.  Leaflets were freely movable without evidence of obstruction.  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  The LIMA to the LAD and vein graft to the obtuse marginal were patent.  He was seen by CT surgery who recommended continued observation and antibiotic therapy.  In November 2022, he was admitted with dyspnea and chest fullness and found to be in rapid atrial fibrillation, which converted to sinus rhythm on intravenous amiodarone.  He underwent stress testing in the setting of chest pain, which showed a small, mild, reversible apical lateral and apical inferior defect, with question of subtle ischemia versus infarct.  Ultimately, it was felt to be a low risk study and he was medically managed.  In October  2023, he was admitted to [facility] with chest pain and mild troponin elevation (32  32).  He was found to be anemic and received 1 unit of packed red blood cells.  Echo showed normal LV function with normal functioning bioprosthetic aortic valve.  Eliquis was held and subsequently resumed in the outpatient setting.  It was felt he would require diagnostic catheterization following anemia workup.  He was subsequently seen by hematology with plan for bone marrow biopsy and diagnosed with myelodysplastic syndrome.  Due to escalating dyspnea and angina, he underwent  diagnostic catheterization and December 2023, with moderate, diffuse multivessel disease with patent LIMA to the LAD and vein graft to the OM 2.  Filling pressures were elevated with a PA of 50/20 and wedge of 25.  Cardiac output/index were normal.  Outpatient diuretic therapy was escalated and he was otherwise medically managed.  He was readmitted in May 2024 with atypical chest pain, normal cardiac enzymes, and volume overload requiring intravenous diuresis.  Echo during that admission showed an EF of 60 to 65% with grade 1 diastolic dysfunction, and normal functioning aortic valve bioprosthesis with a mean gradient of 5 mmHg.  He was subsequently readmitted to [facility] in July 2024 with ongoing dyspnea.  CT of the chest notable for bilateral parenchymal abnormalities largely peribronchial vascular with question of organizing pneumonia versus other idiopathic related pulmonary toxicity.  He was seen by pulmonology.  Amiodarone was discontinued and he was treated with antibiotics.  Repeat echo showed normal LV function and normal functioning bioprosthetic valve with probable mild prosthetic valve stenosis-mean gradient of 23 mmHg.  He was subsequently discharged and has been following up with primary care.  Recent CT chest (03/30/2023) showed increased right lung greater than left peripheral predominant airspace disease possibly representing worsening organizing pneumonia though infection considered less likely etiology.   Mr. Halpin was seen in the [facility] ED in August w/ chest pain and dyspnea. W/u unremarkable and he was d/c'd home.  Since then, he continues to have dyspnea with minimal activity as well as intermittent chest pain, sometimes pleuritic.  He has been off of Lasix and Imdur though is not sure why.  He notes he has not been taking Eliquis since May due to difficulty affording at the though he was offered samples of our clinic, he and his daughter have not had the time to get over here.  He was prescribed  oxygen to around-the-clock but does not, and only uses it as needed.  His daughter notes that sometimes his saturation drops into the 80s, which resolves after placing oxygen.  We discussed the importance of compliance with oxygen therapy today.  Regarding his chest pain, this occurs at rest and lasts between 30 and 60 minutes.  He will take sublingual nitroglycerin but pain seems to improve only after taking either Tylenol or ibuprofen.  He denies palpitations, PND, orthopnea, dizziness, syncope, edema, or early satiety.  Home Medications    Current Outpatient Medications  Medication Sig Dispense Refill   aspirin EC 81 MG tablet Take 81 mg by mouth daily. Swallow whole.     atorvastatin (LIPITOR) 80 MG tablet Take 1 tablet (80 mg total) by mouth every evening. 90 tablet 3   insulin NPH-regular Human (70-30) 100 UNIT/ML injection Inject 130 Units into the skin 2 (two) times daily.     isosorbide mononitrate (IMDUR) 30 MG 24 hr tablet Take 0.5 tablets (15 mg total) by mouth daily. 30 tablet 2  metoprolol succinate (TOPROL-XL) 25 MG 24 hr tablet Take 1 tablet (25 mg total) by mouth 2 (two) times daily. 30 tablet 1   nitroGLYCERIN (NITROSTAT) 0.4 MG SL tablet Place 1 tablet (0.4 mg total) under the tongue every 5 (five) minutes x 3 doses as needed for chest pain. 30 tablet 1   omeprazole (PRILOSEC) 20 MG capsule Take 2 capsules by mouth once daily 60 capsule 0   ranolazine (RANEXA) 500 MG 12 hr tablet Take 1 tablet by mouth twice daily 60 tablet 0   rivaroxaban (XARELTO) 20 MG TABS tablet Take 1 tablet (20 mg total) by mouth daily with supper. 30 tablet 11   tamsulosin (FLOMAX) 0.4 MG CAPS capsule Take 0.4 mg by mouth daily.     furosemide (LASIX) 40 MG tablet Take 1 tablet (40 mg total) by mouth 2 (two) times daily. 180 tablet 3   No current facility-administered medications for this visit.     Review of Systems    Ongoing and longstanding intermittent chest pain and dyspnea on exertion.  He  denies PND, orthopnea, dizziness, syncope, edema, palpitations, or early satiety.  All other systems reviewed and are otherwise negative except as noted above.    Physical Exam    VS:  BP (!) 120/58 (BP Location: Left Arm, Patient Position: Sitting, Cuff Size: Large)   Pulse 81   Ht 5\' 9"  (1.753 m)   Wt 262 lb 8 oz (119.1 kg)   SpO2 95%   BMI 38.76 kg/m  , BMI Body mass index is 38.76 kg/m.     GEN: Obese, in no acute distress. HEENT: normal. Neck: Supple, no bruits.  Mildly elevated JVD-somewhat difficult to gauge secondary to body habitus.   Cardiac: RRR, 2/6 systolic ejection murmur heard throughout.  No rubs or gallops. No clubbing, cyanosis, edema.  Radials 2+/PT 2+ and equal bilaterally.  Respiratory:  Respirations regular and unlabored, clear to auscultation bilaterally. GI: Obese, protuberant, semifirm, nontender, BS + x 4. MS: no deformity or atrophy. Skin: warm and dry, no rash. Neuro:  Strength and sensation are intact. Psych: Normal affect.  Accessory Clinical Findings    ECG personally reviewed by me today - EKG Interpretation Date/Time:  Friday April 10 2023 08:46:04 EDT Ventricular Rate:  81 PR Interval:  202 QRS Duration:  96 QT Interval:  414 QTC Calculation: 480 R Axis:   -38  Text Interpretation: Normal sinus rhythm Left axis deviation Anteroseptal infarct (cited on or before 04-Mar-2023) T wave abnormality, consider lateral ischemia Confirmed by Nicolasa Ducking [phone]) on 04/10/2023 8:52:49 AM  - no acute changes.  Lab Results  Component Value Date   WBC 2.1 (L) 03/04/2023   HGB 8.6 (L) 03/04/2023   HCT 27.8 (L) 03/04/2023   MCV 116.3 (H) 03/04/2023   PLT 96 (L) 03/04/2023   Lab Results  Component Value Date   CREATININE 1.00 03/04/2023   BUN 16 03/04/2023   NA 131 (L) 03/04/2023   K 4.6 03/04/2023   CL 103 03/04/2023   CO2 22 03/04/2023   Lab Results  Component Value Date   ALT 27 12/08/2022   AST 27 12/08/2022   ALKPHOS 51  12/08/2022   BILITOT 0.8 12/08/2022   Lab Results  Component Value Date   CHOL 92 06/03/2021   HDL 16 (L) 06/03/2021   LDLCALC 49 06/03/2021   TRIG 134 06/03/2021   CHOLHDL 5.8 06/03/2021    Lab Results  Component Value Date   HGBA1C 8.1 (H) 12/08/2022  Assessment & Plan    1.  Precordial chest pain/coronary artery disease: Status post CABG x 2 in July 2017.  Diagnostic catheterization in December 2023 with patent LIMA to the LAD and vein graft to the second obtuse marginal and otherwise moderate diffuse CAD.  He has been medically managed since.  He continues to have dyspnea with minimal activity and intermittent chest pain for which he uses sublingual nitroglycerin, Tylenol, and ibuprofen.  Historically, symptoms are somewhat atypical and upon hospital presentations, troponins have been normal.  Overall, his symptoms have been stable.  He notes today that he has been off of Lasix and isosorbide, though he is prescribed oxygen to be worn around-the-clock, he has not been doing this.  I am resuming his Lasix and isosorbide and have strongly encouraged him to use oxygen as prescribed.  He remains on aspirin, beta-blocker, and statin therapy.  2.  Chronic heart failure with preserved ejection fraction: Multiple echoes over the past year.  Most recent echo in July 2024 showed normal LV function with normal functioning aortic valve prosthesis and mild aortic stenosis.  Right heart catheterization December 2022 showed elevated filling pressures with a PA of 50/20 and a wedge of 25.  As above, he has chronic dyspnea.  He has not been experiencing lower extremity edema though has chronic increase in abdominal girth.  On examination, he has at least mildly elevated JVD with exam complicated by body habitus.  He has been out of Lasix for at least 2 weeks though he is not sure why.  Lab work in August showed normal renal function and potassium.  He has chronic hyponatremia.  Resuming Lasix 40 mg twice  daily today.  Blood pressure stable.  Encouraged oxygen use as outlined above.  3.  Chronic dyspnea/abnormal CT chest: Evaluated by pulmonology during hospitalization at [facility] in July.  Initially treated with antibiotics for possible pneumonia.  Amiodarone discontinued at that time.  No significant change in dyspnea.  Recent CT on September 16 showed increased right lung greater than left peripheral predominant airspace disease.  Similar to what was seen in the emergency department on March 04, 2023.  He is afebrile and without cough.  He has scheduled follow-up with pulmonology in November.  Resuming diuretic therapy as outlined above.  Strongly encouraged use of oxygen as prescribed.  4.  Paroxysmal atrial fibrillation: Diagnosed in November 2022.  Amiodarone discontinued in July due to concern for potential lung toxicity.  Now being followed by pulmonology.  He has not been taking Eliquis due to inability to afford.  He did not qualify for assistance program.  We agreed to try the Xarelto assistance program.  If he does not qualify, we discussed generic dabigatran though, this is still likely to be too expensive for him.  We may need to consider warfarin if he is not able to obtain a Xa inhibitor or direct thrombin inhibitor.  If he is able to obtain an oral anticoagulant, we will plan to discontinue aspirin.  Continue beta-blocker therapy.  Fortunately, he has not been experiencing any palpitations or tachycardia.  5.  Primary hypertension: Stable.  6.  Hyperlipidemia: Remains on atorvastatin therapy.  7.  Aortic stenosis status post bioprosthetic aortic valve replacement: Previously complicated by endocarditis.  Normal functioning bioprosthetic arctic valve by echo earlier this year.  8.  Type 2 diabetes mellitus: A1c 8.1 in May.  He is on insulin therapy and followed by primary care.  9.  Obstructive sleep apnea: Wears BiPAP at  night.  10.  Myelodysplastic syndrome: H&H relatively stable at 8.6  and 27.8 by labs in August.  He is scheduled for follow-up lab work and hematology evaluation on October 11.  As previously noted, in the setting of coronary artery disease and chronic chest pain and dyspnea, recommendation for transfusion to maintain hemoglobin at 8.  11.  Disposition: Follow-up in 6 to 8 weeks.  Nicolasa Ducking, NP 04/10/2023, 10:09 AM

## 2023-04-10 NOTE — Patient Instructions (Addendum)
Medication Instructions:  RESUME the Imdur 15 mg once daily (half the 30 mg tablet)  STOP the Eliquis  START Xarelto 20 mg once daily  *If you need a refill on your cardiac medications before your next appointment, please call your pharmacy*   Lab Work: None ordered  If you have labs (blood work) drawn today and your tests are completely normal, you will receive your results only by: MyChart Message (if you have MyChart) OR A paper copy in the mail If you have any lab test that is abnormal or we need to change your treatment, we will call you to review the results.   Testing/Procedures: None ordered   Follow-Up: At [facility], you and your health needs are our priority.  As part of our continuing mission to provide you with exceptional heart care, we have created designated Provider Care Teams.  These Care Teams include your primary Cardiologist (physician) and Advanced Practice Providers (APPs -  Physician Assistants and Nurse Practitioners) who all work together to provide you with the care you need, when you need it.  We recommend signing up for the patient portal called "MyChart".  Sign up information is provided on this After Visit Summary.  MyChart is used to connect with patients for Virtual Visits (Telemedicine).  Patients are able to view lab/test results, encounter notes, upcoming appointments, etc.  Non-urgent messages can be sent to your provider as well.   To learn more about what you can do with MyChart, go to [email or web address].    Your next appointment:   2 month(s)  Provider:   You may see Yvonne Kendall, MD or one of the following Advanced Practice Providers on your designated Care Team:   Nicolasa Ducking, NP

## 2023-04-24 ENCOUNTER — Inpatient Hospital Stay: Payer: Medicare HMO | Admitting: Oncology

## 2023-04-24 ENCOUNTER — Inpatient Hospital Stay: Payer: Medicare HMO

## 2023-04-24 NOTE — Assessment & Plan Note (Deleted)
ZRSR2 mutation.MDS-IB-1 [facility] Dr. Zenaida Niece Deventer's notes were reviewed.  I agree with initiating EPO therapy if his hemoglobin is <=8 or <=9 with evidence of myocardium ischemia.  Labs are reviewed and discussed with patient. Lab Results  Component Value Date   HGB 8.6 (L) 03/04/2023   TIBC 298 01/20/2023   IRONPCTSAT 33 01/20/2023   FERRITIN 160 01/20/2023      Repeat cbc today showed a hemoglobin >10, no need to initiate therapy for now.  Continue close monitor, repeat cbc in 6 weeks follow up in 3 months.

## 2023-05-04 ENCOUNTER — Other Ambulatory Visit: Payer: Self-pay | Admitting: Medical

## 2023-05-12 ENCOUNTER — Telehealth: Payer: Self-pay | Admitting: Internal Medicine

## 2023-05-12 NOTE — Telephone Encounter (Signed)
Spoke to patient's daughter and educated her on the expected side effects of rivaroxaban such as bleeding. Patient denies bleeding that will not stop, pain, swelling, new drainage, or excessive bleeding from a wound, headaches, dizziness, weakness, feeling like he might pass out, urine that looks red, pink, or brown; or bloody or tarry stools, coughing up blood or vomit that looks like coffee grounds. Patient's daughter stated that the blood he was coughing up was from the drainage associated with the nose bleeds. Patient's daughter stated that the patient sees hematology on 10/31 but wanted to make the cardiologist aware that he has experienced some bleeding since starting the rivaroxaban.

## 2023-05-12 NOTE — Telephone Encounter (Signed)
Pt c/o medication issue:  1. Name of Medication:  rivaroxaban (XARELTO) 20 MG TABS tablet   2. How are you currently taking this medication (dosage and times per day)?  Once daily as prescribed  3. Are you having a reaction (difficulty breathing--STAT)?   4. What is your medication issue?   Patient's daughter states the patient has been on Xarelto 20 MG once daily tablets for the past 2-3 weeks, and for the past several days patient has had nose bleeds and bleeding from his bowels. She states he mentions coughing up blood as well, but she assumes it is sinus related. She states patient has an appointment with hematology on Thursday, 10/31.

## 2023-05-14 ENCOUNTER — Inpatient Hospital Stay: Payer: Medicare HMO | Attending: Oncology

## 2023-05-14 ENCOUNTER — Encounter: Payer: Self-pay | Admitting: Oncology

## 2023-05-14 ENCOUNTER — Inpatient Hospital Stay (HOSPITAL_BASED_OUTPATIENT_CLINIC_OR_DEPARTMENT_OTHER): Payer: Medicare HMO | Admitting: Oncology

## 2023-05-14 ENCOUNTER — Other Ambulatory Visit: Payer: Self-pay

## 2023-05-14 VITALS — BP 109/53 | HR 69 | Temp 96.7°F | Resp 18 | Wt 263.5 lb

## 2023-05-14 DIAGNOSIS — R5383 Other fatigue: Secondary | ICD-10-CM | POA: Insufficient documentation

## 2023-05-14 DIAGNOSIS — Z951 Presence of aortocoronary bypass graft: Secondary | ICD-10-CM | POA: Diagnosis not present

## 2023-05-14 DIAGNOSIS — G4730 Sleep apnea, unspecified: Secondary | ICD-10-CM | POA: Insufficient documentation

## 2023-05-14 DIAGNOSIS — I480 Paroxysmal atrial fibrillation: Secondary | ICD-10-CM | POA: Diagnosis not present

## 2023-05-14 DIAGNOSIS — I4891 Unspecified atrial fibrillation: Secondary | ICD-10-CM

## 2023-05-14 DIAGNOSIS — R531 Weakness: Secondary | ICD-10-CM | POA: Insufficient documentation

## 2023-05-14 DIAGNOSIS — E785 Hyperlipidemia, unspecified: Secondary | ICD-10-CM | POA: Insufficient documentation

## 2023-05-14 DIAGNOSIS — Z87442 Personal history of urinary calculi: Secondary | ICD-10-CM | POA: Insufficient documentation

## 2023-05-14 DIAGNOSIS — E119 Type 2 diabetes mellitus without complications: Secondary | ICD-10-CM | POA: Diagnosis not present

## 2023-05-14 DIAGNOSIS — Z7982 Long term (current) use of aspirin: Secondary | ICD-10-CM | POA: Diagnosis not present

## 2023-05-14 DIAGNOSIS — Z87891 Personal history of nicotine dependence: Secondary | ICD-10-CM | POA: Diagnosis not present

## 2023-05-14 DIAGNOSIS — I259 Chronic ischemic heart disease, unspecified: Secondary | ICD-10-CM | POA: Diagnosis not present

## 2023-05-14 DIAGNOSIS — R079 Chest pain, unspecified: Secondary | ICD-10-CM | POA: Diagnosis not present

## 2023-05-14 DIAGNOSIS — K625 Hemorrhage of anus and rectum: Secondary | ICD-10-CM

## 2023-05-14 DIAGNOSIS — D469 Myelodysplastic syndrome, unspecified: Secondary | ICD-10-CM | POA: Diagnosis not present

## 2023-05-14 DIAGNOSIS — I2510 Atherosclerotic heart disease of native coronary artery without angina pectoris: Secondary | ICD-10-CM | POA: Insufficient documentation

## 2023-05-14 DIAGNOSIS — Z8673 Personal history of transient ischemic attack (TIA), and cerebral infarction without residual deficits: Secondary | ICD-10-CM | POA: Insufficient documentation

## 2023-05-14 DIAGNOSIS — Z79899 Other long term (current) drug therapy: Secondary | ICD-10-CM | POA: Diagnosis not present

## 2023-05-14 DIAGNOSIS — I5032 Chronic diastolic (congestive) heart failure: Secondary | ICD-10-CM | POA: Diagnosis not present

## 2023-05-14 DIAGNOSIS — Z794 Long term (current) use of insulin: Secondary | ICD-10-CM | POA: Insufficient documentation

## 2023-05-14 DIAGNOSIS — I110 Hypertensive heart disease with heart failure: Secondary | ICD-10-CM | POA: Insufficient documentation

## 2023-05-14 DIAGNOSIS — Z7901 Long term (current) use of anticoagulants: Secondary | ICD-10-CM | POA: Insufficient documentation

## 2023-05-14 LAB — CBC WITH DIFFERENTIAL (CANCER CENTER ONLY)
Abs Immature Granulocytes: 0.05 10*3/uL (ref 0.00–0.07)
Basophils Absolute: 0 10*3/uL (ref 0.0–0.1)
Basophils Relative: 2 %
Eosinophils Absolute: 0.1 10*3/uL (ref 0.0–0.5)
Eosinophils Relative: 2 %
HCT: 27.8 % — ABNORMAL LOW (ref 39.0–52.0)
Hemoglobin: 8.7 g/dL — ABNORMAL LOW (ref 13.0–17.0)
Immature Granulocytes: 2 %
Lymphocytes Relative: 38 %
Lymphs Abs: 1 10*3/uL (ref 0.7–4.0)
MCH: 34.8 pg — ABNORMAL HIGH (ref 26.0–34.0)
MCHC: 31.3 g/dL (ref 30.0–36.0)
MCV: 111.2 fL — ABNORMAL HIGH (ref 80.0–100.0)
Monocytes Absolute: 0.3 10*3/uL (ref 0.1–1.0)
Monocytes Relative: 10 %
Neutro Abs: 1.3 10*3/uL — ABNORMAL LOW (ref 1.7–7.7)
Neutrophils Relative %: 46 %
Platelet Count: 124 10*3/uL — ABNORMAL LOW (ref 150–400)
RBC: 2.5 MIL/uL — ABNORMAL LOW (ref 4.22–5.81)
RDW: 16.4 % — ABNORMAL HIGH (ref 11.5–15.5)
WBC Count: 2.7 10*3/uL — ABNORMAL LOW (ref 4.0–10.5)
nRBC: 0 % (ref 0.0–0.2)

## 2023-05-14 LAB — CMP (CANCER CENTER ONLY)
ALT: 20 U/L (ref 0–44)
AST: 21 U/L (ref 15–41)
Albumin: 3.6 g/dL (ref 3.5–5.0)
Alkaline Phosphatase: 57 U/L (ref 38–126)
Anion gap: 8 (ref 5–15)
BUN: 17 mg/dL (ref 8–23)
CO2: 24 mmol/L (ref 22–32)
Calcium: 8.3 mg/dL — ABNORMAL LOW (ref 8.9–10.3)
Chloride: 100 mmol/L (ref 98–111)
Creatinine: 1.03 mg/dL (ref 0.61–1.24)
GFR, Estimated: >60 mL/min (ref >60)
Glucose, Bld: 230 mg/dL — ABNORMAL HIGH (ref 70–99)
Potassium: 3.6 mmol/L (ref 3.5–5.1)
Sodium: 132 mmol/L — ABNORMAL LOW (ref 135–145)
Total Bilirubin: 0.6 mg/dL (ref 0.3–1.2)
Total Protein: 8.2 g/dL — ABNORMAL HIGH (ref 6.5–8.1)

## 2023-05-14 LAB — IRON AND TIBC
Iron: 83 ug/dL (ref 45–182)
Saturation Ratios: 28 % (ref 17.9–39.5)
TIBC: 298 ug/dL (ref 250–450)
UIBC: 215 ug/dL

## 2023-05-14 LAB — RETIC PANEL
Immature Retic Fract: 28.3 % — ABNORMAL HIGH (ref 2.3–15.9)
RBC.: 2.5 MIL/uL — ABNORMAL LOW (ref 4.22–5.81)
Retic Count, Absolute: 112.8 K/uL (ref 19.0–186.0)
Retic Ct Pct: 4.5 % — ABNORMAL HIGH (ref 0.4–3.1)
Reticulocyte Hemoglobin: 33.6 pg

## 2023-05-14 LAB — FERRITIN: Ferritin: 91 ng/mL (ref 24–336)

## 2023-05-14 NOTE — Progress Notes (Signed)
Pt here for follow up. Pt reports having irritation nose bleeds. Pt feeling fatigued and weak.

## 2023-05-14 NOTE — Assessment & Plan Note (Addendum)
ZRSR2 mutation.MDS-IB-1 [facility] Dr. Zenaida Niece Deventer's notes were reviewed.  I agree with initiating EPO therapy if his hemoglobin is <=8 or <=9 with evidence of myocardium ischemia.  Labs are reviewed and discussed with patient. Lab Results  Component Value Date   HGB 8.7 (L) 05/14/2023   TIBC 298 05/14/2023   IRONPCTSAT 28 05/14/2023   FERRITIN 91 05/14/2023   I discussed about IV Venofer treatments to further increase iron store.  I discussed about the potential risks including but not limited to allergic reactions/infusion reactions including anaphylactic reactions, phlebitis, high blood pressure, wheezing, SOB, skin rash, weight gain,dark urine, leg swelling, back pain, headache, nausea and fatigue, etc. Patient agrees with IV Venofer.  Plan IV venofer weekly x 4  Repeat cbc in 2 week, recommend starting Retacrit if Hb<10

## 2023-05-14 NOTE — Assessment & Plan Note (Addendum)
Continue Aspirin 81mg  daily. Stop Xarelto . Follow up with cardiology

## 2023-05-14 NOTE — Assessment & Plan Note (Addendum)
He is on Aspirin 81mg  and recently started on Xarelto for A fib. Discussed with Dr. Okey Dupre, recommend patient to stop Eliquis.  Refer to GI for further evaluation.

## 2023-05-14 NOTE — Progress Notes (Signed)
Hematology/Oncology Progress note [phone] [phone]       ASSESSMENT & PLAN:   Myelodysplastic syndrome (HCC) ZRSR2 mutation.MDS-IB-1 [facility] Dr. Zenaida Niece Deventer's notes were reviewed.  I agree with initiating EPO therapy if his hemoglobin is <=8 or <=9 with evidence of myocardium ischemia.  Labs are reviewed and discussed with patient. Lab Results  Component Value Date   HGB 8.7 (L) 05/14/2023   TIBC 298 05/14/2023   IRONPCTSAT 28 05/14/2023   FERRITIN 91 05/14/2023   I discussed about IV Venofer treatments to further increase iron store.  I discussed about the potential risks including but not limited to allergic reactions/infusion reactions including anaphylactic reactions, phlebitis, high blood pressure, wheezing, SOB, skin rash, weight gain,dark urine, leg swelling, back pain, headache, nausea and fatigue, etc. Patient agrees with IV Venofer.  Plan IV venofer weekly x 4  Repeat cbc in 2 week, recommend starting Retacrit if Hb<10    Rectal bleeding He is on Aspirin 81mg  and recently started on Xarelto for A fib. Discussed with Dr. Okey Dupre, recommend patient to stop Eliquis.  Refer to GI for further evaluation.   Atrial fibrillation (HCC) Continue Aspirin 81mg  daily. Stop Xarelto . Follow up with cardiology   Orders Placed This Encounter  Procedures   Iron and TIBC    Standing Status:   Future    Number of Occurrences:   1    Standing Expiration Date:   05/13/2024   Ferritin    Standing Status:   Future    Number of Occurrences:   1    Standing Expiration Date:   05/13/2024   Ambulatory referral to Gastroenterology    Referral Priority:   Urgent    Referral Type:   Consultation    Referral Reason:   Specialty Services Required    Number of Visits Requested:   1   Follow up  Venofer weekly x 4 Lab-2 weeks, +/- retacrit.  Lab - 4 weeks +/- Retacrit 2 months lab prior to MD +/- Venofer +/- Retacrit.   All questions were answered. The patient  knows to call the clinic with any problems, questions or concerns.  [name], MD, PhD [facility] Hematology Oncology 05/14/2023     CHIEF COMPLAINTS/PURPOSE OF CONSULTATION:  MDS  HISTORY OF PRESENTING ILLNESS:  Jonathan Cross. 77 y.o. male presents to establish care for MDS I have reviewed his chart and materials related to his cancer extensively and collaborated history with the patient. Summary of oncologic history is as follows: Oncology History  Myelodysplastic syndrome (HCC)  05/21/2022 Bone Marrow Biopsy   Bone marrow biopsy showed  - - Hypercellular bone marrow (70%) with trilineage hematopoiesis, megakaryocytic dyspoiesis, and 6% blasts by manual aspirate differential (see Comment)  The current findings of pancytopenia, hypercellular bone marrow with megakaryocyte dyspoiesis (>10%) and increased blasts (6%) in addition to the presence of a clonal molecular abnormality (ZRSRS mutation with VAF 50.7) are consistent with myelodysplastic neoplasm with increased blasts (MDS-IB-1) in the absence of MDS or AML defining genetic abnormality.    06/12/2022 Initial Diagnosis   Myelodysplastic syndrome [facility])  Patient follows up with [facility] Dr. Leotis Pain.  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 See above.  Using the IPSS-M, he has low risk disease with a median leukemia free survival of about 6 years.  There is concern of MDS may add to his possible angina.  Dr. Leotis Pain recommends treatment  1) IF he has evidence of cardiac ischemia that has been maximally medically managed AND a hemoglobin < 9 g/dL 2 ) If his hemoglobin was < 8.0   Darbepoietin/GCSF per Darcel Bayley et al Dewayne Hatch Hem 2013 May; 92(5) Lucile Crater 2013 May; 92(5) https://link.[email or web address]/[email or web address])  Ferritin: Treat with iron if ferritin is <  50 Treatment  Darbepoetin 500 mcg every 2 weeks CR = Hgb > 11.5 PR = Inc in Hgb of 1.5 If no response in 12 weeks, add G-CSF 300 mcg two times a week to maintain WBC counts between 5 and 10  If no response at 24 weeks, discontinue trial If hemoglobin is > 12, hold darbepoetin until hemoglobin is < 11. Resume darbepoetin at every 3 weeks. Continue to increase interval by 1 week if hemoglobin increases to > 12 again.      His hemoglobin was 8 on 05/01/22, and he received one unit of PRBC transfusion.  He is accompanied by his daughter today.  + chronic fatigue, intermittent chest pain, stable, not worse.    INTERVAL HISTORY Jonathan Cross. is a 77 y.o. male who has above history reviewed by me today presents for follow up visit for MDS, anemia.   - Fatigue, he reports "increased weakness and tiredness.  He is recently started on Xarelto for Afib. He is also on Aspirin 81mg  daily.  He has noticed intermittent rectal bleeding, bright red color.     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 04/2022 Echo: EF 60-65%, GrII DD; b. 06/2022 RHC: PA 50/20(30), PCWP 25; c. 11/2022 Echo: EF 60-65%, mild LVH, GrI DD. mildly reduced RV fxn, mild MR, nl fxn'ing bioprosthetic AoV; d. 01/2023 Echo: EF 60-65%, nl fxn'ing AoV, prob mild AS. Nl RV fxn.   Coronary artery disease    a. 01/2016 CABG x 2: LIMA->LAD, VG->OM; b. 05/2021 MV: small, mild, rev apical lateral and apical inf defects ->subtle ischemia vs artifact, EF 55-65%-->low risk; c. 06/2022 Cath: LM 20d, LAD 80ost, 60p/m, 53m, D1 mild dzs, LCX 50ost/p, OM1 mod dzs, RCA mild diff dzs, RPL1 75, VG->OM2 & LIMA->LAD patent--> Med Rx.   CVA, old, disturbances of vision    Diabetes (HCC)    H/O aortic valve replacement    a. 01/2016 s/p bioprosthetic AVR @ [facility]; b. 05/2021 Echo: EF 55-60%, nl fxn of  bioprosthetic AVR; c. 04/2022 Echo: EF 60-65%, nl fxn'ing AoV; d. 11/2022 Echo: nl fxn'ing AoV; e. 01/2023 Echo: nl fxn'ing Aov, mean grad .   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 CHA2DS2VASc = 5-->eliquis/amio; b. 01/2023 Amio d/c'd due to concern re: lung toxicity/ongoing dyspnea.   Retinal vascular occlusion of left eye    Several years ago   Sleep apnea    BiPAP   Vertigo    Wears dentures    full upper and lower.  Lower "broken"    SURGICAL HISTORY: Past Surgical History:  Procedure Laterality Date   AORTIC VALVE REPLACEMENT  2017   [facility], bioprosthetic   APPENDECTOMY     BONE MARROW BIOPSY     05/2022   CARDIAC VALVE  REPLACEMENT     CATARACT EXTRACTION W/PHACO Right 11/12/2022   Procedure: CATARACT EXTRACTION PHACO AND INTRAOCULAR LENS PLACEMENT (IOC) RIGHT DIABETIC MALYUGIN  13.49  01:06.1;  Surgeon: Lockie Mola, MD;  Location: [facility];  Service: Ophthalmology;  Laterality: Right;  Diabetic   CORONARY ARTERY BYPASS GRAFT  2017   [facility] - LIMA-LAD and SVG-OM   CYSTOSCOPY W/ RETROGRADES Right 05/27/2019   Procedure: CYSTOSCOPY WITH RETROGRADE PYELOGRAM;  Surgeon: Sondra Come, MD;  Location: [facility];  Service: Urology;  Laterality: Right;   CYSTOSCOPY W/ RETROGRADES Right 07/11/2019   Procedure: CYSTOSCOPY WITH RETROGRADE PYELOGRAM;  Surgeon: Sondra Come, MD;  Location: [facility];  Service: Urology;  Laterality: Right;   CYSTOSCOPY/URETEROSCOPY/HOLMIUM LASER/STENT PLACEMENT Right 05/27/2019   Procedure: CYSTOSCOPY/URETEROSCOPY/HOLMIUM LASER/STENT PLACEMENT;  Surgeon: Sondra Come, MD;  Location: [facility];  Service: Urology;  Laterality: Right;   CYSTOSCOPY/URETEROSCOPY/HOLMIUM LASER/STENT PLACEMENT Right 06/17/2019   Procedure: CYSTOSCOPY/URETEROSCOPY/HOLMIUM  LASER/STENT Exchange;  Surgeon: Sondra Come, MD;  Location: [facility];  Service: Urology;  Laterality: Right;   CYSTOSCOPY/URETEROSCOPY/HOLMIUM LASER/STENT PLACEMENT Right 07/11/2019   Procedure: CYSTOSCOPY/URETEROSCOPY/HOLMIUM LASER/STENT Exchange;  Surgeon: Sondra Come, MD;  Location: [facility];  Service: Urology;  Laterality: Right;   RIGHT HEART CATH AND CORONARY/GRAFT ANGIOGRAPHY Bilateral 06/17/2022   Procedure: RIGHT HEART CATH AND CORONARY/GRAFT ANGIOGRAPHY;  Surgeon: Yvonne Kendall, MD;  Location: [facility];  Service: Cardiovascular;  Laterality: Bilateral;   STONE EXTRACTION WITH BASKET Right 07/11/2019   Procedure: STONE EXTRACTION WITH BASKET;  Surgeon: Sondra Come, MD;  Location: [facility];  Service: Urology;  Laterality: Right;   TEE WITHOUT CARDIOVERSION N/A 04/21/2019   Procedure: TRANSESOPHAGEAL ECHOCARDIOGRAM (TEE);  Surgeon: Antonieta Iba, MD;  Location: [facility];  Service: Cardiovascular;  Laterality: N/A;   TEE WITHOUT CARDIOVERSION N/A 07/04/2019   Procedure: TRANSESOPHAGEAL ECHOCARDIOGRAM (TEE);  Surgeon: Iran Ouch, MD;  Location: [facility];  Service: Cardiovascular;  Laterality: N/A;   TEE WITHOUT CARDIOVERSION N/A 08/17/2019   Procedure: TRANSESOPHAGEAL ECHOCARDIOGRAM (TEE);  Surgeon: Debbe Odea, MD;  Location: [facility];  Service: Cardiovascular;  Laterality: N/A;   TEE WITHOUT CARDIOVERSION N/A 11/15/2019   Procedure: TRANSESOPHAGEAL ECHOCARDIOGRAM (TEE);  Surgeon: Yvonne Kendall, MD;  Location: [facility];  Service: Cardiovascular;  Laterality: N/A;   TONSILLECTOMY      SOCIAL HISTORY: Social History   Socioeconomic History   Marital status: Widowed    Spouse name: Not on file   Number of children: Not on file   Years of education: Not on file   Highest education level: Not on file  Occupational History   Not on file  Tobacco Use   Smoking status: Former    Current packs/day: 0.00    Types: Cigarettes    Quit date:  1970    Years since quitting: 54.8   Smokeless tobacco: Never  Vaping Use   Vaping status: Never Used  Substance and Sexual Activity   Alcohol use: Never   Drug use: Never   Sexual activity: Not Currently  Other Topics Concern   Not on file  Social History Narrative   Not on file   Social Determinants of Health   Financial Resource Strain: Medium Risk (02/25/2023)   Received from [facility]   Overall Financial Resource Strain (CARDIA)    Difficulty of Paying Living Expenses: Somewhat hard  Food Insecurity: Food Insecurity Present (02/25/2023)   Received from [facility]   Hunger Vital Sign    Worried About Running  Out of Food in the Last Year: Often true    Ran Out of Food in the Last Year: Often true  Transportation Needs: No Transportation Needs (02/25/2023)   Received from [facility] - Transportation    Lack of Transportation (Medical): No    Lack of Transportation (Non-Medical): No  Physical Activity: Inactive (04/24/2021)   Received from [facility], [facility]   Exercise Vital Sign    Days of Exercise per Week: 0 days    Minutes of Exercise per Session: 0 min  Stress: No Stress Concern Present (04/24/2021)   Received from [facility], [facility] - Occupational Stress Questionnaire    Feeling of Stress : Not at all  Social Connections: Not on file  Intimate Partner Violence: Not At Risk (12/09/2022)   Humiliation, Afraid, Rape, and Kick questionnaire    Fear of Current or Ex-Partner: No    Emotionally Abused: No    Physically Abused: No    Sexually Abused: No    FAMILY HISTORY: Family History  Problem Relation Age of Onset   Heart attack Father 66   Heart attack Sister     ALLERGIES:  is allergic to metformin.  MEDICATIONS:  Current Outpatient Medications  Medication Sig Dispense Refill   aspirin EC 81 MG tablet Take 81 mg by mouth daily. Swallow whole.     atorvastatin  (LIPITOR) 80 MG tablet Take 1 tablet (80 mg total) by mouth every evening. 90 tablet 3   furosemide (LASIX) 40 MG tablet Take 1 tablet (40 mg total) by mouth 2 (two) times daily. 180 tablet 3   insulin NPH-regular Human (70-30) 100 UNIT/ML injection Inject 130 Units into the skin 2 (two) times daily.     isosorbide mononitrate (IMDUR) 30 MG 24 hr tablet Take 0.5 tablets (15 mg total) by mouth daily. 30 tablet 2   metoprolol succinate (TOPROL-XL) 25 MG 24 hr tablet Take 1 tablet (25 mg total) by mouth 2 (two) times daily. 30 tablet 1   omeprazole (PRILOSEC) 20 MG capsule Take 2 capsules by mouth once daily 60 capsule 0   ranolazine (RANEXA) 500 MG 12 hr tablet Take 1 tablet by mouth twice daily 180 tablet 3   rivaroxaban (XARELTO) 20 MG TABS tablet Take 1 tablet (20 mg total) by mouth daily with supper. 30 tablet 11   tamsulosin (FLOMAX) 0.4 MG CAPS capsule Take 0.4 mg by mouth daily.     nitroGLYCERIN (NITROSTAT) 0.4 MG SL tablet Place 1 tablet (0.4 mg total) under the tongue every 5 (five) minutes x 3 doses as needed for chest pain. (Patient not taking: Reported on 05/14/2023) 30 tablet 1   No current facility-administered medications for this visit.    Review of Systems  Constitutional:  Positive for fatigue. Negative for appetite change, chills and fever.  HENT:   Negative for hearing loss and voice change.   Eyes:  Negative for eye problems and icterus.  Respiratory:  Positive for chest tightness. Negative for cough and shortness of breath.   Cardiovascular:  Negative for chest pain and leg swelling.  Gastrointestinal:  Positive for blood in stool. Negative for abdominal distention and abdominal pain.  Endocrine: Negative for hot flashes.  Genitourinary:  Negative for difficulty urinating, dysuria and frequency.   Musculoskeletal:  Negative for arthralgias.  Skin:  Negative for itching and rash.  Neurological:  Negative for light-headedness and numbness.  Hematological:  Negative for  adenopathy. Does not bruise/bleed easily.  Psychiatric/Behavioral:  Negative for confusion.      PHYSICAL EXAMINATION:  Vitals:   05/14/23 1047  BP: (!) 109/53  Pulse: 69  Resp: 18  Temp: (!) 96.7 F (35.9 C)  SpO2: 96%   Filed Weights   05/14/23 1047  Weight: 263 lb 8 oz (119.5 kg)    Physical Exam Constitutional:      General: He is not in acute distress.    Appearance: He is obese. He is not diaphoretic.  HENT:     Head: Normocephalic and atraumatic.  Eyes:     General: No scleral icterus. Cardiovascular:     Rate and Rhythm: Normal rate.  Pulmonary:     Effort: Pulmonary effort is normal. No respiratory distress.  Abdominal:     General: Bowel sounds are normal. There is no distension.     Palpations: Abdomen is soft.  Musculoskeletal:        General: Normal range of motion.     Cervical back: Normal range of motion.  Skin:    General: Skin is warm and dry.     Findings: No erythema.     Comments: Multiple scratches on right lower extremity.  Neurological:     Mental Status: He is alert and oriented to person, place, and time. Mental status is at baseline.     Cranial Nerves: No cranial nerve deficit.     Motor: No abnormal muscle tone.  Psychiatric:        Mood and Affect: Affect normal.      LABORATORY DATA:  I have reviewed the data as listed    Latest Ref Rng & Units 05/14/2023   10:30 AM 03/04/2023    3:30 PM 01/20/2023   11:11 AM  CBC  WBC 4.0 - 10.5 K/uL 2.7  2.1  3.0   Hemoglobin 13.0 - 17.0 g/dL 8.7  8.6  16.1   Hematocrit 39.0 - 52.0 % 27.8  27.8  31.7   Platelets 150 - 400 K/uL 124  96  137       Latest Ref Rng & Units 05/14/2023   10:30 AM 03/04/2023    3:30 PM 12/13/2022    7:47 AM  CMP  Glucose 70 - 99 mg/dL 096  045  409   BUN 8 - 23 mg/dL 17  16  22    Creatinine 0.61 - 1.24 mg/dL 8.11  9.14  7.82   Sodium 135 - 145 mmol/L 132  131  134   Potassium 3.5 - 5.1 mmol/L 3.6  4.6  4.7   Chloride 98 - 111 mmol/L 100  103  102   CO2 22  - 32 mmol/L 24  22  25    Calcium 8.9 - 10.3 mg/dL 8.3  8.2  8.9   Total Protein 6.5 - 8.1 g/dL 8.2     Total Bilirubin 0.3 - 1.2 mg/dL 0.6     Alkaline Phos 38 - 126 U/L 57     AST 15 - 41 U/L 21     ALT 0 - 44 U/L 20        RADIOGRAPHIC STUDIES: I have personally reviewed the radiological images as listed and agreed with the findings in the report. No results found.

## 2023-05-25 ENCOUNTER — Other Ambulatory Visit: Payer: Self-pay | Admitting: Nurse Practitioner

## 2023-05-26 NOTE — Telephone Encounter (Signed)
OK to continue refilling Omeprazole by Dr. Okey Dupre?  last visit 04/10/23 with plan to f/u in  2 months.    next visit: 06/24/23

## 2023-05-27 ENCOUNTER — Other Ambulatory Visit: Payer: Self-pay

## 2023-05-27 DIAGNOSIS — D469 Myelodysplastic syndrome, unspecified: Secondary | ICD-10-CM

## 2023-05-28 ENCOUNTER — Inpatient Hospital Stay: Payer: Medicare HMO | Attending: Oncology

## 2023-05-28 ENCOUNTER — Inpatient Hospital Stay: Payer: Medicare HMO

## 2023-05-28 DIAGNOSIS — D469 Myelodysplastic syndrome, unspecified: Secondary | ICD-10-CM | POA: Insufficient documentation

## 2023-05-28 LAB — HEMOGLOBIN AND HEMATOCRIT (CANCER CENTER ONLY)
HCT: 31.5 % — ABNORMAL LOW (ref 39.0–52.0)
Hemoglobin: 10 g/dL — ABNORMAL LOW (ref 13.0–17.0)

## 2023-05-28 NOTE — Progress Notes (Signed)
No retacrit. HbG is 10

## 2023-06-01 ENCOUNTER — Emergency Department: Payer: Medicare HMO

## 2023-06-01 ENCOUNTER — Observation Stay: Payer: Medicare HMO

## 2023-06-01 ENCOUNTER — Observation Stay
Admission: EM | Admit: 2023-06-01 | Discharge: 2023-06-02 | Disposition: A | Discharge status: Home / Self Care | Payer: Medicare HMO | Attending: Internal Medicine | Admitting: Internal Medicine

## 2023-06-01 ENCOUNTER — Encounter: Payer: Self-pay | Admitting: Radiology

## 2023-06-01 ENCOUNTER — Other Ambulatory Visit: Payer: Self-pay

## 2023-06-01 DIAGNOSIS — Z79899 Other long term (current) drug therapy: Secondary | ICD-10-CM | POA: Diagnosis not present

## 2023-06-01 DIAGNOSIS — I5032 Chronic diastolic (congestive) heart failure: Secondary | ICD-10-CM | POA: Diagnosis not present

## 2023-06-01 DIAGNOSIS — D469 Myelodysplastic syndrome, unspecified: Secondary | ICD-10-CM | POA: Diagnosis present

## 2023-06-01 DIAGNOSIS — I25110 Atherosclerotic heart disease of native coronary artery with unstable angina pectoris: Secondary | ICD-10-CM | POA: Diagnosis not present

## 2023-06-01 DIAGNOSIS — Z1152 Encounter for screening for COVID-19: Secondary | ICD-10-CM | POA: Diagnosis not present

## 2023-06-01 DIAGNOSIS — Z952 Presence of prosthetic heart valve: Secondary | ICD-10-CM | POA: Insufficient documentation

## 2023-06-01 DIAGNOSIS — I480 Paroxysmal atrial fibrillation: Secondary | ICD-10-CM | POA: Diagnosis present

## 2023-06-01 DIAGNOSIS — I2510 Atherosclerotic heart disease of native coronary artery without angina pectoris: Secondary | ICD-10-CM | POA: Diagnosis not present

## 2023-06-01 DIAGNOSIS — I5030 Unspecified diastolic (congestive) heart failure: Secondary | ICD-10-CM | POA: Diagnosis not present

## 2023-06-01 DIAGNOSIS — Z87891 Personal history of nicotine dependence: Secondary | ICD-10-CM | POA: Insufficient documentation

## 2023-06-01 DIAGNOSIS — Z7982 Long term (current) use of aspirin: Secondary | ICD-10-CM | POA: Diagnosis not present

## 2023-06-01 DIAGNOSIS — Z8673 Personal history of transient ischemic attack (TIA), and cerebral infarction without residual deficits: Secondary | ICD-10-CM | POA: Diagnosis not present

## 2023-06-01 DIAGNOSIS — G4733 Obstructive sleep apnea (adult) (pediatric): Secondary | ICD-10-CM | POA: Diagnosis present

## 2023-06-01 DIAGNOSIS — I482 Chronic atrial fibrillation, unspecified: Secondary | ICD-10-CM

## 2023-06-01 DIAGNOSIS — E119 Type 2 diabetes mellitus without complications: Secondary | ICD-10-CM

## 2023-06-01 DIAGNOSIS — N3000 Acute cystitis without hematuria: Secondary | ICD-10-CM

## 2023-06-01 DIAGNOSIS — R0789 Other chest pain: Secondary | ICD-10-CM | POA: Diagnosis present

## 2023-06-01 DIAGNOSIS — N182 Chronic kidney disease, stage 2 (mild): Secondary | ICD-10-CM | POA: Insufficient documentation

## 2023-06-01 DIAGNOSIS — I350 Nonrheumatic aortic (valve) stenosis: Secondary | ICD-10-CM

## 2023-06-01 DIAGNOSIS — G8929 Other chronic pain: Secondary | ICD-10-CM | POA: Diagnosis present

## 2023-06-01 DIAGNOSIS — N390 Urinary tract infection, site not specified: Secondary | ICD-10-CM | POA: Diagnosis not present

## 2023-06-01 DIAGNOSIS — I2089 Other forms of angina pectoris: Secondary | ICD-10-CM | POA: Diagnosis present

## 2023-06-01 DIAGNOSIS — Z951 Presence of aortocoronary bypass graft: Secondary | ICD-10-CM | POA: Insufficient documentation

## 2023-06-01 DIAGNOSIS — Z794 Long term (current) use of insulin: Secondary | ICD-10-CM | POA: Diagnosis not present

## 2023-06-01 DIAGNOSIS — R4182 Altered mental status, unspecified: Secondary | ICD-10-CM | POA: Insufficient documentation

## 2023-06-01 DIAGNOSIS — G9340 Encephalopathy, unspecified: Secondary | ICD-10-CM | POA: Insufficient documentation

## 2023-06-01 DIAGNOSIS — I200 Unstable angina: Principal | ICD-10-CM | POA: Diagnosis present

## 2023-06-01 DIAGNOSIS — J189 Pneumonia, unspecified organism: Secondary | ICD-10-CM | POA: Diagnosis not present

## 2023-06-01 DIAGNOSIS — Z7901 Long term (current) use of anticoagulants: Secondary | ICD-10-CM | POA: Diagnosis not present

## 2023-06-01 DIAGNOSIS — R072 Precordial pain: Secondary | ICD-10-CM

## 2023-06-01 DIAGNOSIS — K219 Gastro-esophageal reflux disease without esophagitis: Secondary | ICD-10-CM | POA: Diagnosis present

## 2023-06-01 DIAGNOSIS — E1122 Type 2 diabetes mellitus with diabetic chronic kidney disease: Secondary | ICD-10-CM | POA: Diagnosis not present

## 2023-06-01 LAB — URINALYSIS, W/ REFLEX TO CULTURE (INFECTION SUSPECTED)
Bilirubin Urine: NEGATIVE
Glucose, UA: NEGATIVE mg/dL
Ketones, ur: NEGATIVE mg/dL
Nitrite: POSITIVE — AB
Protein, ur: 100 mg/dL — AB
Specific Gravity, Urine: 1.01 (ref 1.005–1.030)
Squamous Epithelial / HPF: 0 /[HPF] (ref 0–5)
WBC, UA: >50 WBC/hpf (ref 0–5)
pH: 5 (ref 5.0–8.0)

## 2023-06-01 LAB — COMPREHENSIVE METABOLIC PANEL
ALT: 23 U/L (ref 0–44)
AST: 27 U/L (ref 15–41)
Albumin: 3.3 g/dL — ABNORMAL LOW (ref 3.5–5.0)
Alkaline Phosphatase: 59 U/L (ref 38–126)
Anion gap: 10 (ref 5–15)
BUN: 18 mg/dL (ref 8–23)
CO2: 23 mmol/L (ref 22–32)
Calcium: 8.8 mg/dL — ABNORMAL LOW (ref 8.9–10.3)
Chloride: 100 mmol/L (ref 98–111)
Creatinine, Ser: 1.29 mg/dL — ABNORMAL HIGH (ref 0.61–1.24)
GFR, Estimated: 56 mL/min — ABNORMAL LOW (ref >60)
Glucose, Bld: 190 mg/dL — ABNORMAL HIGH (ref 70–99)
Potassium: 4.2 mmol/L (ref 3.5–5.1)
Sodium: 133 mmol/L — ABNORMAL LOW (ref 135–145)
Total Bilirubin: 1.2 mg/dL — ABNORMAL HIGH (ref <1.2)
Total Protein: 8 g/dL (ref 6.5–8.1)

## 2023-06-01 LAB — RESP PANEL BY RT-PCR (RSV, FLU A&B, COVID)  RVPGX2
Influenza A by PCR: NEGATIVE
Influenza B by PCR: NEGATIVE
Resp Syncytial Virus by PCR: NEGATIVE
SARS Coronavirus 2 by RT PCR: NEGATIVE

## 2023-06-01 LAB — CBC WITH DIFFERENTIAL/PLATELET
Abs Immature Granulocytes: 0.38 10*3/uL — ABNORMAL HIGH (ref 0.00–0.07)
Basophils Absolute: 0.1 10*3/uL (ref 0.0–0.1)
Basophils Relative: 1 %
Eosinophils Absolute: 0.1 10*3/uL (ref 0.0–0.5)
Eosinophils Relative: 1 %
HCT: 27 % — ABNORMAL LOW (ref 39.0–52.0)
Hemoglobin: 8.5 g/dL — ABNORMAL LOW (ref 13.0–17.0)
Immature Granulocytes: 6 %
Lymphocytes Relative: 25 %
Lymphs Abs: 1.6 10*3/uL (ref 0.7–4.0)
MCH: 35 pg — ABNORMAL HIGH (ref 26.0–34.0)
MCHC: 31.5 g/dL (ref 30.0–36.0)
MCV: 111.1 fL — ABNORMAL HIGH (ref 80.0–100.0)
Monocytes Absolute: 0.6 10*3/uL (ref 0.1–1.0)
Monocytes Relative: 10 %
Neutro Abs: 3.7 10*3/uL (ref 1.7–7.7)
Neutrophils Relative %: 57 %
Platelets: 176 10*3/uL (ref 150–400)
RBC: 2.43 MIL/uL — ABNORMAL LOW (ref 4.22–5.81)
RDW: 15.9 % — ABNORMAL HIGH (ref 11.5–15.5)
Smear Review: NORMAL
WBC: 6.4 10*3/uL (ref 4.0–10.5)
nRBC: 0 % (ref 0.0–0.2)

## 2023-06-01 LAB — TROPONIN I (HIGH SENSITIVITY)
Troponin I (High Sensitivity): 13 ng/L (ref <18)
Troponin I (High Sensitivity): 15 ng/L (ref <18)

## 2023-06-01 LAB — AMMONIA: Ammonia: 20 umol/L (ref 9–35)

## 2023-06-01 LAB — PROCALCITONIN: Procalcitonin: <0.1 ng/mL

## 2023-06-01 MED ORDER — ONDANSETRON HCL 4 MG/2ML IJ SOLN: 4.0000 mg | Freq: Four times a day (QID) | INTRAMUSCULAR | Status: DC | PRN

## 2023-06-01 MED ORDER — ATORVASTATIN CALCIUM 20 MG PO TABS
80.0000 mg | ORAL_TABLET | Freq: Every evening | ORAL | Status: DC
  Administered 2023-06-01: 80 mg via ORAL
  Filled 2023-06-01: qty 4

## 2023-06-01 MED ORDER — ACETAMINOPHEN 325 MG PO TABS: 650.0000 mg | ORAL_TABLET | ORAL | Status: DC | PRN

## 2023-06-01 MED ORDER — ASPIRIN 81 MG PO TBEC
81.0000 mg | DELAYED_RELEASE_TABLET | Freq: Every day | ORAL | Status: DC
  Administered 2023-06-02: 81 mg via ORAL
  Filled 2023-06-01: qty 1

## 2023-06-01 MED ORDER — SODIUM CHLORIDE 0.9 % IV SOLN
2.0000 g | INTRAVENOUS | Status: DC
  Administered 2023-06-02: 2 g via INTRAVENOUS
  Filled 2023-06-01: qty 20

## 2023-06-01 MED ORDER — PANTOPRAZOLE SODIUM 40 MG PO TBEC
40.0000 mg | DELAYED_RELEASE_TABLET | Freq: Every day | ORAL | Status: DC
  Administered 2023-06-01: 40 mg via ORAL
  Filled 2023-06-01: qty 1

## 2023-06-01 MED ORDER — INSULIN ASPART PROT & ASPART (70-30 MIX) 100 UNIT/ML ~~LOC~~ SUSP
65.0000 [IU] | Freq: Two times a day (BID) | SUBCUTANEOUS | Status: DC
  Administered 2023-06-02: 65 [IU] via SUBCUTANEOUS
  Filled 2023-06-01: qty 10

## 2023-06-01 MED ORDER — RIVAROXABAN 20 MG PO TABS: 20.0000 mg | ORAL_TABLET | Freq: Every day | ORAL | Status: DC

## 2023-06-01 MED ORDER — ISOSORBIDE MONONITRATE ER 30 MG PO TB24
15.0000 mg | ORAL_TABLET | Freq: Every day | ORAL | Status: DC
  Administered 2023-06-01 – 2023-06-02 (×2): 15 mg via ORAL
  Filled 2023-06-01 (×2): qty 1

## 2023-06-01 MED ORDER — METOPROLOL SUCCINATE ER 50 MG PO TB24
25.0000 mg | ORAL_TABLET | Freq: Two times a day (BID) | ORAL | Status: DC
  Administered 2023-06-01 – 2023-06-02 (×3): 25 mg via ORAL
  Filled 2023-06-01 (×3): qty 1

## 2023-06-01 MED ORDER — IOHEXOL 350 MG/ML SOLN
75.0000 mL | Freq: Once | INTRAVENOUS | Status: AC | PRN
  Administered 2023-06-01: 75 mL via INTRAVENOUS

## 2023-06-01 MED ORDER — RANOLAZINE ER 500 MG PO TB12
500.0000 mg | ORAL_TABLET | Freq: Two times a day (BID) | ORAL | Status: DC
  Administered 2023-06-01 – 2023-06-02 (×3): 500 mg via ORAL
  Filled 2023-06-01 (×3): qty 1

## 2023-06-01 MED ORDER — FUROSEMIDE 40 MG PO TABS
40.0000 mg | ORAL_TABLET | Freq: Two times a day (BID) | ORAL | Status: DC
  Administered 2023-06-01 – 2023-06-02 (×2): 40 mg via ORAL
  Filled 2023-06-01 (×2): qty 1

## 2023-06-01 MED ORDER — AZITHROMYCIN 500 MG PO TABS
500.0000 mg | ORAL_TABLET | Freq: Every day | ORAL | Status: DC
  Administered 2023-06-02: 500 mg via ORAL
  Filled 2023-06-01: qty 1

## 2023-06-01 MED ORDER — ENOXAPARIN SODIUM 60 MG/0.6ML IJ SOSY
0.5000 mg/kg | PREFILLED_SYRINGE | INTRAMUSCULAR | Status: DC
  Administered 2023-06-01: 60 mg via SUBCUTANEOUS
  Filled 2023-06-01: qty 0.6

## 2023-06-01 MED ORDER — DEXTROSE 5 % IV SOLN
500.0000 mg | Freq: Once | INTRAVENOUS | Status: AC
  Administered 2023-06-01: 500 mg via INTRAVENOUS
  Filled 2023-06-01: qty 5

## 2023-06-01 MED ORDER — DEXTROSE 5 % IV SOLN: 500.0000 mg | INTRAVENOUS | Status: DC

## 2023-06-01 MED ORDER — SODIUM CHLORIDE 0.9 % IV SOLN
1.0000 g | Freq: Once | INTRAVENOUS | Status: AC
  Administered 2023-06-01: 1 g via INTRAVENOUS
  Filled 2023-06-01: qty 10

## 2023-06-01 NOTE — Assessment & Plan Note (Signed)
Positive cough some mild shortness of breath with noted new infiltrate on CT of the chest concerning for pneumonia Will place on IV Rocephin azithromycin for infectious coverage [place] Monitor

## 2023-06-01 NOTE — ED Provider Notes (Signed)
[facility] Provider Note    Event Date/Time   First MD Initiated Contact with Patient 06/01/23 [phone]     (approximate)   History   No chief complaint on file.   HPI  Jonathan Cross. is a 77 y.o. male   Past medical history of MDS, atrial fibrillation recently discontinued ac in the setting of GI bleeding, aortic valve replacement, prior CVA, CAD status post CABG, here with chest pain.  He had crushing chest pain that awoke him from sleep.  He took aspirin and nitro with some relief.  He had associated shortness of breath.  He has a mild chest pain and shortness of breath currently but much improved from prior.  He states that he also has some dysuria bothering him over the last couple days and a mild cough as well.  No fevers.     External Medical Documents Reviewed: Oncology notes from 05/14/2023 which note that he has had recent rectal bleeding and is on aspirin, with "discussed with Dr. Okey Dupre, recommend patient to stop Eliquis" but also notes that he had recently started Xarelto for atrial fibrillation, and had stopped Xarelto with a plan to follow-up with cardiology.  When asked patient about his anticoagulation status, he states he is unsure but takes medications as prescribed by his doctor.      Physical Exam   Triage Vital Signs: ED Triage Vitals  Encounter Vitals Group     BP 06/01/23 0355 127/63     Systolic BP Percentile --      Diastolic BP Percentile --      Pulse Rate 06/01/23 0355 73     Resp 06/01/23 0355 15     Temp 06/01/23 0355 98 F (36.7 C)     Temp Source 06/01/23 0355 Oral     SpO2 06/01/23 0355 98 %     Weight 06/01/23 0358 259 lb 3.2 oz (117.6 kg)     Height 06/01/23 0358 5\' 9"  (1.753 m)     Head Circumference --      Peak Flow --      Pain Score --      Pain Loc --      Pain Education --      Exclude from Growth Chart --     Most recent vital signs: Vitals:   06/01/23 0500 06/01/23 0731  BP:    Pulse: 63    Resp: 16   Temp:  (!) 97.5 F (36.4 C)  SpO2: 98%     General: Awake, no distress.  CV:  Good peripheral perfusion.  Resp:  Normal effort.  Abd:  No distention.  Other:  Awake alert comfortable appearing in no acute distress with normal vital signs no hypoxemia no fever.  Lungs are clear to auscultation without wheeze or focality.  Soft nontender abdomen.  Skin appears warm well-perfused.   ED Results / Procedures / Treatments   Labs (all labs ordered are listed, but only abnormal results are displayed) Labs Reviewed  CBC WITH DIFFERENTIAL/PLATELET - Abnormal; Notable for the following components:      Result Value   RBC 2.43 (*)    Hemoglobin 8.5 (*)    HCT 27.0 (*)    MCV 111.1 (*)    MCH 35.0 (*)    RDW 15.9 (*)    Abs Immature Granulocytes 0.38 (*)    All other components within normal limits  COMPREHENSIVE METABOLIC PANEL - Abnormal; Notable for the following components:  Sodium 133 (*)    Glucose, Bld 190 (*)    Creatinine, Ser 1.29 (*)    Calcium 8.8 (*)    Albumin 3.3 (*)    Total Bilirubin 1.2 (*)    GFR, Estimated 56 (*)    All other components within normal limits  URINALYSIS, W/ REFLEX TO CULTURE (INFECTION SUSPECTED) - Abnormal; Notable for the following components:   Color, Urine YELLOW (*)    APPearance TURBID (*)    Hgb urine dipstick SMALL (*)    Protein, ur 100 (*)    Nitrite POSITIVE (*)    Leukocytes,Ua LARGE (*)    Bacteria, UA MANY (*)    All other components within normal limits  RESP PANEL BY RT-PCR (RSV, FLU A&B, COVID)  RVPGX2  URINE CULTURE  TROPONIN I (HIGH SENSITIVITY)     I ordered and reviewed the above labs they are notable for H&H consistent with chronic anemia, unchanged, electrolytes largely unremarkable, initial troponin is 13 and urinalysis consistent with urinary tract infection  EKG  ED ECG REPORT I, Pilar Jarvis, the attending physician, personally viewed and interpreted this ECG.   Date: 06/01/2023  EKG Time: 0448   Rate: 61  Rhythm: sinus  Axis: nl  Intervals:long pr  ST&T Change: no stemi    RADIOLOGY I independently reviewed and interpreted chest x-ray and I see no obvious focal opacity or pneumothorax I also reviewed radiologist's formal read.   PROCEDURES:  Critical Care performed: No  Procedures   MEDICATIONS ORDERED IN ED: Medications  cefTRIAXone (ROCEPHIN) 1 g in sodium chloride 0.9 % 100 mL IVPB (has no administration in time range)     IMPRESSION / MDM / ASSESSMENT AND PLAN / ED COURSE  I reviewed the triage vital signs and the nursing notes.                                Patient's presentation is most consistent with acute presentation with potential threat to life or bodily function.  Differential diagnosis includes, but is not limited to, ACS, PE, dissection, pneumonia, urinary tract infection   The patient is on the cardiac monitor to evaluate for evidence of arrhythmia and/or significant heart rate changes.  MDM:    High risk chest pain patient with significant cardiac history here with crushing chest pain that awoke him from sleep last night associate with shortness of breath.  It seems that he may have stopped his anticoagulation in the setting of GI bleeding recently, patient unsure of medications, given his history of MDS and atrial fibrillation high risk for PE so getting a CT angiogram.  Fortunately his symptoms have gotten much better after aspirin/nitro, and at the time of signout his initial troponin has been negative, and he is pending the results of his CT angiogram as well as repeat troponin.  He has evidence of urinary tract infection so he started on IV Rocephin.  Anticipate he will be admitted given his high risk chest pain       FINAL CLINICAL IMPRESSION(S) / ED DIAGNOSES   Final diagnoses:  Unstable angina (HCC)  Acute cystitis without hematuria     Rx / DC Orders   ED Discharge Orders     None        Note:  This document was  prepared using Dragon voice recognition software and may include unintentional dictation errors.    Pilar Jarvis, MD 06/01/23 [phone]

## 2023-06-01 NOTE — Assessment & Plan Note (Signed)
Baseline CAD followed by [facility] cardiology  Noted evaluations for recurring CP in the past  Had patent grafts at his cath 06/2022  Trop neg x2 today  EKG NSR  Follow up cardiology recommendations

## 2023-06-01 NOTE — ED Notes (Signed)
Pt takes nitroglycerin  sl x3 prior to ems arrival ems admin 324mg  ASA PO during transport and 1 spray nitroglycerin pt cardiac hx includes valve replacement

## 2023-06-01 NOTE — Assessment & Plan Note (Addendum)
Hemoglobin 8.5 today which appears to be near baseline Transfuse for hgb <7  Monitor

## 2023-06-01 NOTE — ED Notes (Signed)
Pt unable to urinate at this time. Pt made aware that we are still needing a urine sample. Pt stated, "I am getting close to it."

## 2023-06-01 NOTE — Assessment & Plan Note (Signed)
2D echo May 2024 with EF of 60 to 65% and grade 1 diastolic dysfunction Appears euvolemic at present Monitor Follow-up cardiology recommendations

## 2023-06-01 NOTE — Assessment & Plan Note (Signed)
CPAP.  

## 2023-06-01 NOTE — Assessment & Plan Note (Signed)
Positive generalized lethargy on presentation Suspect this is general baseline though not fully clear Current confounding issues include pneumonia and UTI Nonfocal neuroexam Will check CT head x 1 Ammonia level VBG x 1 UDS Treat infectious process Monitor

## 2023-06-01 NOTE — Assessment & Plan Note (Signed)
PPI ?

## 2023-06-01 NOTE — Assessment & Plan Note (Signed)
Urinalysis indicative of infection with noted dysuria IV Rocephin Urine culture Monitor

## 2023-06-01 NOTE — Assessment & Plan Note (Signed)
Status post aortic valve replacement

## 2023-06-01 NOTE — Assessment & Plan Note (Signed)
Blood sugar in 190s SSI Monitor

## 2023-06-01 NOTE — Assessment & Plan Note (Addendum)
Recurring central chest pain moderate to severe in intensity Looks to be longstanding issue in discussion with cardiology Has had normal workup multiple times in the past per report Troponin negative x 2 EKG normal sinus rhythm S/p full dose ASA  Will otherwise monitor for now Cardiology formally consulted

## 2023-06-01 NOTE — Consult Note (Signed)
Cardiology Consult    Patient ID: Leonte Raveling [facility]. MRN: [id number], DOB/AGE: 1946/02/18   Admit date: 06/01/2023 Date of Consult: 06/01/2023  Primary Physician: Aviva Kluver Primary Cardiologist: Yvonne Kendall, MD Requesting Provider: Kathie Rhodes. Alvester Morin, MD  Patient Profile    Jonathan Cross. is a 77 y.o. male with a history of CAD status post CABG x2 (LIMA to the LAD, vein graft to the obtuse marginal), severe aortic stenosis status post bioprosthetic aortic valve replacement complicated by endocarditis, paroxysmal atrial fibrillation, hypertension, hyperlipidemia, type 2 diabetes mellitus, sleep apnea, and BPH, who is being seen today for the evaluation of chest pain w/ nl troponins at the request of Dr. Alvester Morin.  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 04/2022 Echo: EF 60-65%, GrII DD; b. 06/2022 RHC: PA 50/20(30), PCWP 25; c. 11/2022 Echo: EF 60-65%, mild LVH, GrI DD. mildly reduced RV fxn, mild MR, nl fxn'ing bioprosthetic AoV; d. 01/2023 Echo: EF 60-65%, nl fxn'ing AoV, prob mild AS. Nl RV fxn.   Coronary artery disease    a. 01/2016 CABG x 2: LIMA->LAD, VG->OM; b. 05/2021 MV: small, mild, rev apical lateral and apical inf defects ->subtle ischemia vs artifact, EF 55-65%-->low risk; c. 06/2022 Cath: LM 20d, LAD 80ost, 60p/m, 5m, D1 mild dzs, LCX 50ost/p, OM1 mod dzs, RCA mild diff dzs, RPL1 75, VG->OM2 & LIMA->LAD patent--> Med Rx.   CVA, old, disturbances of vision    Diabetes (HCC)    H/O aortic valve replacement    a. 01/2016 s/p bioprosthetic AVR @ [facility]; b. 05/2021 Echo: EF 55-60%, nl fxn of bioprosthetic AVR; c. 04/2022 Echo: EF 60-65%, nl fxn'ing AoV; d. 11/2022 Echo: nl fxn'ing AoV; e. 01/2023 Echo: nl fxn'ing Aov, mean grad .   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 CHA2DS2VASc = 5-->eliquis/amio; b. 01/2023 Amio d/c'd due to concern re: lung toxicity/ongoing dyspnea.   Retinal vascular occlusion of left eye    Several years ago   Sleep apnea    BiPAP   Vertigo    Wears dentures    full upper and lower.  Lower "broken"    Past Surgical History:  Procedure Laterality Date   AORTIC VALVE REPLACEMENT  2017   [facility], bioprosthetic   APPENDECTOMY     BONE MARROW BIOPSY     05/2022   CARDIAC VALVE REPLACEMENT     CATARACT EXTRACTION W/PHACO Right 11/12/2022   Procedure: CATARACT EXTRACTION PHACO AND INTRAOCULAR LENS PLACEMENT (IOC) RIGHT DIABETIC MALYUGIN  13.49  01:06.1;  Surgeon: Lockie Mola, MD;  Location: [facility];  Service: Ophthalmology;  Laterality: Right;  Diabetic   CORONARY ARTERY BYPASS GRAFT  2017   [facility] - LIMA-LAD and SVG-OM   CYSTOSCOPY W/ RETROGRADES Right 05/27/2019   Procedure: CYSTOSCOPY WITH RETROGRADE PYELOGRAM;  Surgeon: Sondra Come, MD;  Location: [facility];  Service: Urology;  Laterality: Right;   CYSTOSCOPY W/ RETROGRADES Right 07/11/2019   Procedure: CYSTOSCOPY WITH RETROGRADE PYELOGRAM;  Surgeon: Sondra Come, MD;  Location: [facility];  Service: Urology;  Laterality: Right;   CYSTOSCOPY/URETEROSCOPY/HOLMIUM LASER/STENT PLACEMENT Right 05/27/2019   Procedure: CYSTOSCOPY/URETEROSCOPY/HOLMIUM  LASER/STENT PLACEMENT;  Surgeon: Sondra Come, MD;  Location: [facility];  Service: Urology;  Laterality: Right;   CYSTOSCOPY/URETEROSCOPY/HOLMIUM LASER/STENT PLACEMENT Right 06/17/2019   Procedure: CYSTOSCOPY/URETEROSCOPY/HOLMIUM LASER/STENT Exchange;  Surgeon: Sondra Come, MD;  Location: [facility];  Service: Urology;  Laterality: Right;   CYSTOSCOPY/URETEROSCOPY/HOLMIUM LASER/STENT PLACEMENT Right 07/11/2019   Procedure: CYSTOSCOPY/URETEROSCOPY/HOLMIUM LASER/STENT  Exchange;  Surgeon: Sondra Come, MD;  Location: [facility];  Service: Urology;  Laterality: Right;   RIGHT HEART CATH AND CORONARY/GRAFT ANGIOGRAPHY Bilateral 06/17/2022   Procedure: RIGHT HEART CATH AND CORONARY/GRAFT ANGIOGRAPHY;  Surgeon: Yvonne Kendall, MD;  Location: [facility];  Service: Cardiovascular;  Laterality: Bilateral;   STONE EXTRACTION WITH BASKET Right 07/11/2019   Procedure: STONE EXTRACTION WITH BASKET;  Surgeon: Sondra Come, MD;  Location: [facility];  Service: Urology;  Laterality: Right;   TEE WITHOUT CARDIOVERSION N/A 04/21/2019   Procedure: TRANSESOPHAGEAL ECHOCARDIOGRAM (TEE);  Surgeon: Antonieta Iba, MD;  Location: [facility];  Service: Cardiovascular;  Laterality: N/A;   TEE WITHOUT CARDIOVERSION N/A 07/04/2019   Procedure: TRANSESOPHAGEAL ECHOCARDIOGRAM (TEE);  Surgeon: Iran Ouch, MD;  Location: [facility];  Service: Cardiovascular;  Laterality: N/A;   TEE WITHOUT CARDIOVERSION N/A 08/17/2019   Procedure: TRANSESOPHAGEAL ECHOCARDIOGRAM (TEE);  Surgeon: Debbe Odea, MD;  Location: [facility];  Service: Cardiovascular;  Laterality: N/A;   TEE WITHOUT CARDIOVERSION N/A 11/15/2019   Procedure: TRANSESOPHAGEAL ECHOCARDIOGRAM (TEE);  Surgeon: Yvonne Kendall, MD;  Location: [facility];  Service: Cardiovascular;  Laterality: N/A;   TONSILLECTOMY       Allergies  Allergies  Allergen Reactions   Metformin Diarrhea    Loose stools even with XR      History of Present Illness     77 y.o. y/o male with above past medical history following CAD status post CABG x2, severe aortic stenosis status post bioprosthetic aortic valve replacement, bacterial endocarditis, paroxysmal atrial fibrillation, hypertension, hyperlipidemia, type 2 diabetes mellitus, sleep apnea, and BPH.  He was previously followed at [facility] in the setting of severe aortic stenosis and underwent bioprosthetic aortic valve replacement two-vessel coronary artery bypass grafting in July  2017.  Between October 2020 and May 2021, he had multiple admissions related to fever, malaise, and Enterococcus faecalis bacteremia.  TEEs in October 2020 and December 2020 were negative for vegetation.  In early 2021, he was found to have a mobile mass on his prosthetic aortic valve and was seen by infectious disease treated with appropriate antibiotics for 6 weeks in the outpatient setting.  Prior to PICC line removal, repeat TEE in May 2021 continued to show an echodensity that appeared less mobile but larger, measuring 1.1 x 0.6 cm.  Perivalvular abscess could not be excluded.  He was transferred to [facility] and underwent cardiac CT which showed evidence of a small mass in the leaflet of the noncoronary cusp, likely representing a healed vegetation.  Leaflets were freely movable without evidence of obstruction.  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  The LIMA to the LAD and vein graft to the obtuse marginal were patent.  He was seen by CT surgery who recommended continued observation and antibiotic therapy.  In November 2022, he was admitted with dyspnea and chest fullness and found to be in rapid atrial fibrillation, which converted to sinus rhythm on intravenous amiodarone.  He underwent stress testing in the setting of chest pain, which showed a small, mild, reversible  apical lateral and apical inferior defect, with question of subtle ischemia versus infarct.  Ultimately, it was felt to be a low risk study and he was medically managed.    In October  2023, he was admitted to [facility] with chest pain and mild troponin elevation (32  32).  He was found to be anemic and received 1 unit of packed red blood cells.  Echo showed normal LV function with normal functioning bioprosthetic aortic valve.  Eliquis was held and subsequently resumed in the outpatient setting.  It was felt he would require diagnostic  catheterization following anemia workup.  He was subsequently seen by hematology with plan for bone marrow biopsy and diagnosed with myelodysplastic syndrome.  Due to escalating dyspnea and angina, he underwent diagnostic catheterization and December 2023, with moderate, diffuse multivessel disease with patent LIMA to the LAD and vein graft to the OM 2.  Filling pressures were elevated with a PA of 50/20 and wedge of 25.  Cardiac output/index were normal.  Outpatient diuretic therapy was escalated and he was otherwise medically managed.  He was readmitted in May 2024 with atypical chest pain, normal cardiac enzymes, and volume overload requiring intravenous diuresis.  Echo during that admission showed an EF of 60 to 65% with grade 1 diastolic dysfunction, and normal functioning aortic valve bioprosthesis with a mean gradient of 5 mmHg.  He was subsequently readmitted to [facility] in July 2024 with ongoing dyspnea.  CT of the chest notable for bilateral parenchymal abnormalities largely peribronchial vascular with question of organizing pneumonia versus other idiopathic related pulmonary toxicity.  He was seen by pulmonology.  Amiodarone was discontinued and he was treated with antibiotics.  Repeat echo showed normal LV function and normal functioning bioprosthetic valve with probable mild prosthetic valve stenosis-mean gradient of 23 mmHg.  He was subsequently discharged and has been following up with primary care.  Recent CT chest (03/30/2023) showed increased right lung greater than left peripheral predominant airspace disease possibly representing worsening organizing pneumonia though infection considered less likely etiology.    Jonathan Cross was seen in the [facility] ED in 02/2023 due to c/p and dyspnea w/ negative w/u. At most recent cardiology visit in 03/2023, he c/o DOE while not taking lasix x 2 wks, and lasix was resumed.  He has not been taking eliquis @ that time, due to inability to afford, and we were able to get  him enrolled in the xarelto assistance program.  He subsequently developed nose bleeds and rectal bleeding.  Xarelto was d/c'd and he was referred to GI.  Jonathan Cross has chronic, almost daily chest pain and DOE.  Chest pain typically occurs at rest, lasts several hours, and resolves spontaneously.  Over the past week, he has been experiencing profound diarrhea w/ associated malaise and fatigue.  He's been having difficulty sleeping due to diarrhea along w/ polyuria and dysuria.  On 11/18, he had more frequent bouts of diarrhea, and at 2:30 AM, while already awake, he developed severe sscp, similar to prior anginal episodes.  He took 3 sl ntg w/o relief and called EMS.  They gave him sl ntg spray w/o relief and he was taken to the ED.  Here, he was afebrile, normotensive.  ECG: RSR, 61, 1st deg AVB, leftward axis, anteroseptal infarct, lateral T abnormalities.  Trops nl @ 13  15.  Creat sl elev above prior baseline @ 1.29.  Relatively stable macrocytic anemia @ 8.5/27.0  UA w/ many bacteria, + nitriate, large leukocytes.  CXR unremarkable.  CTA chest neg for PE but new peripheral mixed solid/ground-glass opacity in the right lung upper lobe, laterally favoring sequela of atypical pneumonia.  He was started on azithromycin and ceftriaxone.  Currently c/o 1/10 chest pain.    Inpatient Medications  Subjective    Scheduled Meds:  [START ON 06/02/2023] aspirin EC  81 mg Oral Daily   atorvastatin  80 mg Oral QPM   furosemide  40 mg Oral BID   insulin aspart protamine- aspart  65 Units Subcutaneous BID WC   metoprolol succinate  25 mg Oral BID   pantoprazole  40 mg Oral Daily   rivaroxaban  20 mg Oral Q supper   Continuous Infusions:  [START ON 06/02/2023] azithromycin     [START ON 06/02/2023] cefTRIAXone (ROCEPHIN)  IV     PRN Meds:.acetaminophen, ondansetron (ZOFRAN) IV  Family History    Family History  Problem Relation Age of Onset   Heart attack Father 28   Heart attack Sister    He  indicated that his mother is deceased. He indicated that his father is deceased. He indicated that two of his three sisters are alive.   Social History    Social History   Socioeconomic History   Marital status: Widowed    Spouse name: Not on file   Number of children: Not on file   Years of education: Not on file   Highest education level: Not on file  Occupational History   Not on file  Tobacco Use   Smoking status: Former    Current packs/day: 0.00    Types: Cigarettes    Quit date: 1970    Years since quitting: 54.9   Smokeless tobacco: Never  Vaping Use   Vaping status: Never Used  Substance and Sexual Activity   Alcohol use: Never   Drug use: Never   Sexual activity: Not Currently  Other Topics Concern   Not on file  Social History Narrative   Not on file   Social Determinants of Health   Financial Resource Strain: Medium Risk (02/25/2023)   Received from [name]   Overall Financial Resource Strain (CARDIA)    Difficulty of Paying Living Expenses: Somewhat hard  Food Insecurity: Food Insecurity Present (02/25/2023)   Received from [facility]   Hunger Vital Sign    Worried About Running Out of Food in the Last Year: Often true    Ran Out of Food in the Last Year: Often true  Transportation Needs: No Transportation Needs (02/25/2023)   Received from [facility]   PRAPARE - Transportation    Lack of Transportation (Medical): No    Lack of Transportation (Non-Medical): No  Physical Activity: Inactive (04/24/2021)   Received from [facility], [facility]   Exercise Vital Sign    Days of Exercise per Week: 0 days    Minutes of Exercise per Session: 0 min  Stress: No Stress Concern Present (04/24/2021)   Received from [facility] - Occupational Stress Questionnaire    Feeling of Stress : Not at all  Social Connections: Not on file  Intimate Partner Violence: Not At Risk  (12/09/2022)   Humiliation, Afraid, Rape, and Kick questionnaire    Fear of Current or Ex-Partner: No    Emotionally Abused: No    Physically Abused: No    Sexually Abused: No     Review of Systems  General:  No chills, fever, night sweats or weight changes.  Cardiovascular:  +++ chest pain, +++ dyspnea on exertion, no edema, orthopnea, palpitations, paroxysmal nocturnal dyspnea. Dermatological: No rash, lesions/masses Respiratory: No cough, +++ dyspnea Urologic: No hematuria, dysuria Abdominal:   +++ nausea, +++ 1 episode of vomiting last week, +++ diarrhea, prior h/o bright red blood per rectum but none recently, no melena, or hematemesis Neurologic:  No visual changes, wkns, changes in mental status. All other systems reviewed and are otherwise negative except as noted above.    Objective  Physical Exam    Blood pressure (!) 115/57, pulse 64, temperature 97.8 F (36.6 C), resp. rate 18, height 5\' 9"  (1.753 m), weight 117.6 kg, SpO2 99%.  General: Pleasant, NAD Psych: Normal affect. Neuro: Alert and oriented X 3. Moves all extremities spontaneously.  Somewhat lethargic. HEENT: Normal  Neck: Supple without bruits.  Body habitus makes exam challenging. Lungs:  Resp regular and unlabored, CTA. Heart: RRR no s3, s4.  2/6 SEM @ upper sternal borders. Abdomen: Soft, non-tender, non-distended, BS + x 4.  Extremities: No clubbing, cyanosis or edema. DP/PT2+, Radials 2+ and equal bilaterally.  Labs    Cardiac Enzymes Recent Labs  Lab 06/01/23 0426 06/01/23 0750  TROPONINIHS 13 15     BNP    Component Value Date/Time   BNP 39.2 12/08/2022 1540    Lab Results  Component Value Date   WBC 6.4 06/01/2023   HGB 8.5 (L) 06/01/2023   HCT 27.0 (L) 06/01/2023   MCV 111.1 (H) 06/01/2023   PLT 176 06/01/2023    Recent Labs  Lab 06/01/23 0426  NA 133*  K 4.2  CL 100  CO2 23  BUN 18  CREATININE 1.29*  CALCIUM 8.8*  PROT 8.0  BILITOT 1.2*  ALKPHOS 59  ALT 23  AST 27   GLUCOSE 190*   Lab Results  Component Value Date   CHOL 92 06/03/2021   HDL 16 (L) 06/03/2021   LDLCALC 49 06/03/2021   TRIG 134 06/03/2021   Lab Results  Component Value Date   HGBA1C 8.1 (H) 12/08/2022      Radiology Studies    CT Angio Chest PE W/Cm &/Or Wo Cm  Result Date: 06/01/2023 CLINICAL DATA:  Pulmonary embolism (PE) suspected, high prob. Cough. Chest pain. EXAM: CT ANGIOGRAPHY CHEST WITH CONTRAST TECHNIQUE: Multidetector CT imaging of the chest was performed using the standard protocol during bolus administration of intravenous contrast. Multiplanar CT image reconstructions and MIPs were obtained to evaluate the vascular anatomy. RADIATION DOSE REDUCTION: This exam was performed according to the departmental dose-optimization program which includes automated exposure control, adjustment of the mA and/or kV according to patient size and/or use of iterative reconstruction technique. CONTRAST:  75mL OMNIPAQUE IOHEXOL 350 MG/ML SOLN COMPARISON:  CT angiography chest from 03/04/2023. FINDINGS: Cardiovascular: No evidence of embolism to the proximal subsegmental pulmonary artery level. Normal cardiac size. No pericardial effusion. No aortic aneurysm. There are coronary artery calcifications, in keeping with coronary artery disease. There are also mild-to-moderate peripheral atherosclerotic vascular calcifications of thoracic aorta and its major branches. Prosthetic aortic valve noted. Mediastinum/Nodes: Visualized thyroid gland appears grossly unremarkable. No solid / cystic mediastinal masses. The esophagus is nondistended precluding optimal assessment. There are few mildly prominent mediastinal and hilar lymph nodes, which do not meet the size criteria for lymphadenopathy and appear grossly similar to the prior study, favoring benign etiology. No axillary lymphadenopathy by size criteria. Lungs/Pleura: The central tracheo-bronchial tree is patent. There are  dependent changes in bilateral  lungs. There is near complete resolution of previously seen predominantly peripheral ground-glass opacities throughout bilateral lungs. For example series 6, images 72, 82, 86, 94, etc. However, there is a new similar characteristic peripheral mixed solid/ground-glass opacity in the right lung upper lobe, laterally (series 6, image 51), which is new since the prior study. Findings again favor sequela of atypical pneumonia. Correlate clinically. No suspicious lung mass or dense consolidation. No pleural effusion or pneumothorax. No suspicious lung nodule. Upper Abdomen: Contracted gallbladder containing small volume hyperdense calculi without imaging signs of acute cholecystitis. Remaining visualized upper abdominal viscera within normal limits. Musculoskeletal: Sternotomy wires noted. The visualized soft tissues of the chest wall are otherwise grossly unremarkable. No suspicious osseous lesions. There are mild multilevel degenerative changes in the visualized spine. Review of the MIP images confirms the above findings. IMPRESSION: *No evidence of embolism to the proximal subsegmental pulmonary artery level. *Near complete resolution of previously seen predominantly peripheral ground-glass opacities throughout bilateral lungs. There is a 1 small area of new opacity on the current exam. Findings again favor sequela of atypical pneumonia. Correlate clinically. No suspicious lung mass, dense consolidation, pleural effusion or pneumothorax. No suspicious lung nodule. *Multiple other nonacute observations, as described above. Aortic Atherosclerosis (ICD10-I70.0). Electronically Signed   By: Jules Schick M.D.   On: 06/01/2023 09:10   DG Chest 1 View  Result Date: 06/01/2023 CLINICAL DATA:  Cough and chest pain. EXAM: CHEST  1 VIEW COMPARISON:  03/04/2023 FINDINGS: Previous median sternotomy and CABG procedure. Stable cardiomediastinal contours. Aortic atherosclerotic calcifications. No pleural fluid, interstitial  edema or airspace disease. IMPRESSION: 1. No acute findings. 2. Previous CABG procedure. Electronically Signed   By: Signa Kell M.D.   On: 06/01/2023 05:45      ECG & Cardiac Imaging    RSR, 61, 1st deg AVB, leftward axis, anteroseptal infarct, lateral T abnormalities - personally reviewed.  Assessment & Plan    1.  Precordial chest pain /CAD:  Status post CABG x 2 in July 2017. Diagnostic catheterization in December 2023 with patent LIMA to the LAD and vein graft to the second obtuse marginal and otherwise moderate diffuse CAD. He has been medically managed since.  He has chronic, nearly daily chest pain, often x several hours @ a time, along w/ DOE.  In the setting of a 1 wk h/o diarrhea and dysuria, he developed recurrent c/p early this AM, which was not initially nitrate responsive.  ECG unremarkable.  Despite 10 hrs of ongoing symptoms, troponins are normal.  He has been found to have a UTI and CT chest suggestive of sequelae of PNA  Abx per medicine team.  Cont asa, statin, ? blocker.  Resume home doses of imdur and ranexa.  In the absence of objective evidence of ischemia, no plan for ischemic eval @ this time.  2.  ? PNA/chronic resp failure:  chronic dyspnea.  Supposed to use O2 at home but freq noncompliant.  ? Of PNA on CT chest.  Afebrile, wbc nl.  Abx per IM.  Of note, prev eval @ [facility] due to dyspnea and abnl CT concerning for PNA.  Amio d/c'd at that time.  He says that he has an appt w/ pulm @ [facility] this month.  3.  UTI:  has been having profound diarrhea and subsequently developed dysuria/polyuria.  UTI suggestive of UTI.  Abx per IM.  4.  PAF:  in sinus.  Cont ? blocker.  Xarelto ordered, however he  has not been taking in outpt setting after it was d/c'd in late October due to rectal bleeding - I've d/c'd.  He is on ASA in setting of #1.  5.  Chronic HFpEF:  EF 60-65% by echo in 11/2022.  Does not appear to be volume overloaded.  HR/BP initially stable on arrival.  Creat sl above  prior baseline, likely 2/2 diarrhea.  Cont current dose of lasix w/ low threshold to hold if creat worsens.  6.  S/p AVR:  previously complicated by SBE.  Nl fxn'ing bioprosthetic AoV on echo earlier this year.  7.  DMII:  A1c 7.4 in Aug.  Per IM.  8.  HTN:  stable.  9.  HL:  cont statin rx. F/u lipids - no check in 2 yrs here or elsewhere.  10.  MDS/Anemia:  stable.  Sees onc as outpt.  11.  Rectal bleeding:  was having rectal bleeding on xarelto  D/c'd 10.31.  Onc was referring to GI.  No recent bleeding.  H/H stable.  12.  Hyponatremia:  chronic/stable.  Risk Assessment/Risk Scores:     TIMI Risk Score for Unstable Angina or Non-ST Elevation MI:   The patient's TIMI risk score is 5, which indicates a 26% risk of all cause mortality, new or recurrent myocardial infarction or need for urgent revascularization in the next 14 days.      Signed, Nicolasa Ducking, NP 06/01/2023, 11:10 AM  For questions or updates, please contact   Please consult [email or web address] for contact info under Cardiology/STEMI.

## 2023-06-01 NOTE — H&P (Addendum)
History and Physical    Patient: Jonathan Cross. ZOX:[id number] DOB: 30-Sep-1945 DOA: 06/01/2023 DOS: the patient was seen and examined on 06/01/2023 PCP: Pcp, No  Patient coming from: Home  Chief Complaint: No chief complaint on file.  HPI: Jonathan Slocumb. is a 77 y.o. 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  Patient reports nonspecific central chest pain over the past 3 to 4 days.  Mild to severe in nature.  No associated nausea or diaphoresis.  Patient noted to have been evaluated both in the inpatient outpatient setting multiple times without issue.  Noted admission in June 2024 with cardiology signing off but no major medical changes.  Patient reports recurring pain.  Pain at rest as well as with exertion.  No abdominal pain, nausea or vomiting.  Positive shortness of breath.  Positive dysuria for patient has been compliant with home medication regimen. Presented to the ER afebrile, hemodynamically stable.  Satting well on room air. Review of Systems: As mentioned in the history of present illness. All other systems reviewed and are negative. 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 04/2022 Echo: EF 60-65%, GrII DD; b. 06/2022 RHC: PA 50/20(30), PCWP 25; c. 11/2022 Echo: EF 60-65%, mild LVH, GrI DD. mildly reduced RV fxn, mild MR, nl fxn'ing bioprosthetic AoV; d. 01/2023 Echo: EF 60-65%, nl fxn'ing AoV, prob mild AS. Nl RV fxn.   Coronary artery disease    a. 01/2016 CABG x 2: LIMA->LAD, VG->OM; b. 05/2021 MV: small, mild, rev apical lateral and apical inf defects ->subtle ischemia vs artifact, EF 55-65%-->low risk; c.  06/2022 Cath: LM 20d, LAD 80ost, 60p/m, 73m, D1 mild dzs, LCX 50ost/p, OM1 mod dzs, RCA mild diff dzs, RPL1 75, VG->OM2 & LIMA->LAD patent--> Med Rx.   CVA, old, disturbances of vision    Diabetes (HCC)    H/O aortic valve replacement    a. 01/2016 s/p bioprosthetic AVR @ [facility]; b. 05/2021 Echo: EF 55-60%, nl fxn of bioprosthetic AVR; c. 04/2022 Echo: EF 60-65%, nl fxn'ing AoV; d. 11/2022 Echo: nl fxn'ing AoV; e. 01/2023 Echo: nl fxn'ing Aov, mean grad .   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 CHA2DS2VASc = 5-->eliquis/amio; b. 01/2023 Amio d/c'd due to concern re: lung toxicity/ongoing dyspnea.   Retinal vascular occlusion of left eye    Several years ago   Sleep apnea    BiPAP   Vertigo    Wears dentures    full upper and lower.  Lower "broken"   Past Surgical History:  Procedure Laterality Date   AORTIC VALVE REPLACEMENT  2017   [facility], bioprosthetic   APPENDECTOMY     BONE MARROW BIOPSY     05/2022   CARDIAC VALVE REPLACEMENT     CATARACT EXTRACTION W/PHACO Right 11/12/2022   Procedure: CATARACT EXTRACTION PHACO AND INTRAOCULAR LENS PLACEMENT (IOC) RIGHT DIABETIC MALYUGIN  13.49  01:06.1;  Surgeon: Lockie Mola, MD;  Location: [facility];  Service:  Ophthalmology;  Laterality: Right;  Diabetic   CORONARY ARTERY BYPASS GRAFT  2017   [facility] - LIMA-LAD and SVG-OM   CYSTOSCOPY W/ RETROGRADES Right 05/27/2019   Procedure: CYSTOSCOPY WITH RETROGRADE PYELOGRAM;  Surgeon: Sondra Come, MD;  Location: [facility];  Service: Urology;  Laterality: Right;   CYSTOSCOPY W/ RETROGRADES Right 07/11/2019   Procedure: CYSTOSCOPY WITH RETROGRADE PYELOGRAM;  Surgeon: Sondra Come, MD;  Location: [facility];  Service: Urology;  Laterality: Right;   CYSTOSCOPY/URETEROSCOPY/HOLMIUM  LASER/STENT PLACEMENT Right 05/27/2019   Procedure: CYSTOSCOPY/URETEROSCOPY/HOLMIUM LASER/STENT PLACEMENT;  Surgeon: Sondra Come, MD;  Location: [facility];  Service: Urology;  Laterality: Right;   CYSTOSCOPY/URETEROSCOPY/HOLMIUM LASER/STENT PLACEMENT Right 06/17/2019   Procedure: CYSTOSCOPY/URETEROSCOPY/HOLMIUM LASER/STENT Exchange;  Surgeon: Sondra Come, MD;  Location: [facility];  Service: Urology;  Laterality: Right;   CYSTOSCOPY/URETEROSCOPY/HOLMIUM LASER/STENT PLACEMENT Right 07/11/2019   Procedure: CYSTOSCOPY/URETEROSCOPY/HOLMIUM LASER/STENT Exchange;  Surgeon: Sondra Come, MD;  Location: [facility];  Service: Urology;  Laterality: Right;   RIGHT HEART CATH AND CORONARY/GRAFT ANGIOGRAPHY Bilateral 06/17/2022   Procedure: RIGHT HEART CATH AND CORONARY/GRAFT ANGIOGRAPHY;  Surgeon: Yvonne Kendall, MD;  Location: [facility];  Service: Cardiovascular;  Laterality: Bilateral;   STONE EXTRACTION WITH BASKET Right 07/11/2019   Procedure: STONE EXTRACTION WITH BASKET;  Surgeon: Sondra Come, MD;  Location: [facility];  Service: Urology;  Laterality: Right;   TEE WITHOUT CARDIOVERSION N/A 04/21/2019   Procedure: TRANSESOPHAGEAL ECHOCARDIOGRAM (TEE);  Surgeon: Antonieta Iba, MD;  Location: [facility];  Service: Cardiovascular;  Laterality: N/A;   TEE WITHOUT CARDIOVERSION N/A 07/04/2019   Procedure: TRANSESOPHAGEAL ECHOCARDIOGRAM (TEE);  Surgeon: Iran Ouch, MD;  Location: [facility];  Service: Cardiovascular;  Laterality: N/A;   TEE WITHOUT CARDIOVERSION N/A 08/17/2019   Procedure: TRANSESOPHAGEAL ECHOCARDIOGRAM (TEE);  Surgeon: Debbe Odea, MD;  Location: [facility];  Service: Cardiovascular;  Laterality: N/A;   TEE WITHOUT CARDIOVERSION N/A 11/15/2019   Procedure: TRANSESOPHAGEAL ECHOCARDIOGRAM (TEE);  Surgeon: Yvonne Kendall, MD;  Location: [facility];  Service: Cardiovascular;  Laterality: N/A;   TONSILLECTOMY     Social History:  reports that he quit smoking  about 54 years ago. His smoking use included cigarettes. He has never used smokeless tobacco. He reports that he does not drink alcohol and does not use drugs.  Allergies  Allergen Reactions   Metformin Diarrhea    Loose stools even with XR    Family History  Problem Relation Age of Onset   Heart attack Father 27   Heart attack Sister     Prior to Admission medications   Medication Sig Start Date End Date Taking? Authorizing Provider  aspirin EC 81 MG tablet Take 81 mg by mouth daily. Swallow whole.   Yes [provider]  atorvastatin (LIPITOR) 80 MG tablet Take 1 tablet (80 mg total) by mouth every evening. 11/01/21  Yes Creig Hines, NP  furosemide (LASIX) 40 MG tablet Take 1 tablet (40 mg total) by mouth 2 (two) times daily. 04/10/23 04/09/24 Yes Creig Hines, NP  insulin NPH-regular Human (70-30) 100 UNIT/ML injection Inject 130 Units into the skin 2 (two) times daily.   Yes [provider]  isosorbide mononitrate (IMDUR) 30 MG 24 hr tablet Take 0.5 tablets (15 mg total) by mouth daily. 12/14/22  Yes Enedina Finner, MD  metoprolol succinate (TOPROL-XL) 25 MG 24 hr tablet Take 1 tablet (25 mg total) by mouth 2 (two) times daily. 12/14/22  Yes Enedina Finner, MD  nitroGLYCERIN (NITROSTAT) 0.4 MG  SL tablet Place 1 tablet (0.4 mg total) under the tongue every 5 (five) minutes x 3 doses as needed for chest pain. 08/12/22  Yes Sreenath, Sudheer B, MD  omeprazole (PRILOSEC) 20 MG capsule Take 2 capsules by mouth once daily 05/26/23  Yes End, Cristal Deer, MD  ranolazine (RANEXA) 500 MG 12 hr tablet Take 1 tablet by mouth twice daily 05/05/23  Yes End, Cristal Deer, MD  tamsulosin (FLOMAX) 0.4 MG CAPS capsule Take 0.4 mg by mouth daily. 03/06/21  Yes [provider]  rivaroxaban (XARELTO) 20 MG TABS tablet Take 1 tablet (20 mg total) by mouth daily with supper. Patient not taking: Reported on 06/01/2023 04/10/23   Creig Hines, NP    Physical  Exam: Vitals:   06/01/23 0430 06/01/23 0500 06/01/23 0731 06/01/23 0856  BP: 131/66   (!) 115/57  Pulse: 63 63  64  Resp: 10 16  18   Temp:   (!) 97.5 F (36.4 C) 97.8 F (36.6 C)  TempSrc:   Oral   SpO2: 100% 98%  99%  Weight:      Height:       Physical Exam Constitutional:      Appearance: He is obese.     Comments: Mild generalized lethargy answer questions  HENT:     Head: Normocephalic and atraumatic.     Nose: Nose normal.     Mouth/Throat:     Mouth: Mucous membranes are moist.  Eyes:     Pupils: Pupils are equal, round, and reactive to light.  Cardiovascular:     Rate and Rhythm: Normal rate and regular rhythm.  Pulmonary:     Effort: Pulmonary effort is normal.     Breath sounds: No wheezing.  Abdominal:     General: Bowel sounds are normal.     Comments: Obese abdomen Nontender  Musculoskeletal:        General: Normal range of motion.  Skin:    General: Skin is warm.  Neurological:     General: No focal deficit present.  Psychiatric:        Mood and Affect: Mood normal.     Data Reviewed:  There are no new results to review at this time.  CT Angio Chest PE W/Cm &/Or Wo Cm CLINICAL DATA:  Pulmonary embolism (PE) suspected, high prob. Cough. Chest pain.  EXAM: CT ANGIOGRAPHY CHEST WITH CONTRAST  TECHNIQUE: Multidetector CT imaging of the chest was performed using the standard protocol during bolus administration of intravenous contrast. Multiplanar CT image reconstructions and MIPs were obtained to evaluate the vascular anatomy.  RADIATION DOSE REDUCTION: This exam was performed according to the departmental dose-optimization program which includes automated exposure control, adjustment of the mA and/or kV according to patient size and/or use of iterative reconstruction technique.  CONTRAST:  75mL OMNIPAQUE IOHEXOL 350 MG/ML SOLN  COMPARISON:  CT angiography chest from 03/04/2023.  FINDINGS: Cardiovascular: No evidence of embolism to the  proximal subsegmental pulmonary artery level.  Normal cardiac size. No pericardial effusion. No aortic aneurysm. There are coronary artery calcifications, in keeping with coronary artery disease. There are also mild-to-moderate peripheral atherosclerotic vascular calcifications of thoracic aorta and its major branches. Prosthetic aortic valve noted.  Mediastinum/Nodes: Visualized thyroid gland appears grossly unremarkable. No solid / cystic mediastinal masses. The esophagus is nondistended precluding optimal assessment. There are few mildly prominent mediastinal and hilar lymph nodes, which do not meet the size criteria for lymphadenopathy and appear grossly similar to the prior study, favoring benign etiology. No  axillary lymphadenopathy by size criteria.  Lungs/Pleura: The central tracheo-bronchial tree is patent. There are dependent changes in bilateral lungs. There is near complete resolution of previously seen predominantly peripheral ground-glass opacities throughout bilateral lungs. For example series 6, images 72, 82, 86, 94, etc. However, there is a new similar characteristic peripheral mixed solid/ground-glass opacity in the right lung upper lobe, laterally (series 6, image 51), which is new since the prior study. Findings again favor sequela of atypical pneumonia. Correlate clinically. No suspicious lung mass or dense consolidation. No pleural effusion or pneumothorax. No suspicious lung nodule.  Upper Abdomen: Contracted gallbladder containing small volume hyperdense calculi without imaging signs of acute cholecystitis. Remaining visualized upper abdominal viscera within normal limits.  Musculoskeletal: Sternotomy wires noted. The visualized soft tissues of the chest wall are otherwise grossly unremarkable. No suspicious osseous lesions. There are mild multilevel degenerative changes in the visualized spine.  Review of the MIP images confirms the above  findings.  IMPRESSION: *No evidence of embolism to the proximal subsegmental pulmonary artery level. *Near complete resolution of previously seen predominantly peripheral ground-glass opacities throughout bilateral lungs. There is a 1 small area of new opacity on the current exam. Findings again favor sequela of atypical pneumonia. Correlate clinically. No suspicious lung mass, dense consolidation, pleural effusion or pneumothorax. No suspicious lung nodule. *Multiple other nonacute observations, as described above.  Aortic Atherosclerosis (ICD10-I70.0).  Electronically Signed   By: Jules Schick M.D.   On: 06/01/2023 09:10 DG Chest 1 View CLINICAL DATA:  Cough and chest pain.  EXAM: CHEST  1 VIEW  COMPARISON:  03/04/2023  FINDINGS: Previous median sternotomy and CABG procedure. Stable cardiomediastinal contours. Aortic atherosclerotic calcifications. No pleural fluid, interstitial edema or airspace disease.  IMPRESSION: 1. No acute findings. 2. Previous CABG procedure.  Electronically Signed   By: Signa Kell M.D.   On: 06/01/2023 05:45  Lab Results  Component Value Date   WBC 6.4 06/01/2023   HGB 8.5 (L) 06/01/2023   HCT 27.0 (L) 06/01/2023   MCV 111.1 (H) 06/01/2023   PLT 176 06/01/2023   Last metabolic panel Lab Results  Component Value Date   GLUCOSE 190 (H) 06/01/2023   NA 133 (L) 06/01/2023   K 4.2 06/01/2023   CL 100 06/01/2023   CO2 23 06/01/2023   BUN 18 06/01/2023   CREATININE 1.29 (H) 06/01/2023   GFRNONAA 56 (L) 06/01/2023   CALCIUM 8.8 (L) 06/01/2023   PHOS 2.6 08/18/2019   PROT 8.0 06/01/2023   ALBUMIN 3.3 (L) 06/01/2023   LABGLOB 2.6 10/25/2021   AGRATIO 1.5 10/25/2021   BILITOT 1.2 (H) 06/01/2023   ALKPHOS 59 06/01/2023   AST 27 06/01/2023   ALT 23 06/01/2023   ANIONGAP 10 06/01/2023    Assessment and Plan: Atypical chest pain Recurring central chest pain moderate to severe in intensity Looks to be longstanding issue in  discussion with cardiology Has had normal workup multiple times in the past per report Troponin negative x 2 EKG normal sinus rhythm S/p full dose ASA  Will otherwise monitor for now Cardiology formally consulted   Paroxysmal atrial fibrillation/A-flutter (HCC) Rate controlled at present Continue home regimen including Toprol and ASA- no longer taking xarelto in setting of hx/o rectal bleeding  Follow cardiology recommendations  Encephalopathy Positive generalized lethargy on presentation Suspect this is general baseline though not fully clear Current confounding issues include pneumonia and UTI Nonfocal neuroexam Will check CT head x 1 Ammonia level VBG x 1 UDS Treat infectious process  Monitor  UTI (urinary tract infection) Urinalysis indicative of infection with noted dysuria IV Rocephin Urine culture Monitor  Pneumonia Positive cough some mild shortness of breath with noted new infiltrate on CT of the chest concerning for pneumonia Will place on IV Rocephin azithromycin for infectious coverage Panculture Monitor  Myelodysplastic syndrome (HCC) Hemoglobin 8.5 today which appears to be near baseline Transfuse for hgb <7  Monitor  Chronic heart failure with preserved ejection fraction (HFpEF) (HCC) 2D echo May 2024 with EF of 60 to 65% and grade 1 diastolic dysfunction Appears euvolemic at present Monitor Follow-up cardiology recommendations  Coronary artery disease Baseline CAD followed by [facility] cardiology  Noted evaluations for recurring CP in the past  Had patent grafts at his cath 06/2022  Trop neg x2 today  EKG NSR  Follow up cardiology recommendations    GERD (gastroesophageal reflux disease) PPI  Nonrheumatic aortic valve stenosis Status post aortic valve replacement  Type 2 diabetes mellitus without complication, with long-term current use of insulin (HCC) Blood sugar in 190s SSI Monitor  OSA (obstructive sleep apnea) CPAP      Advance  Care Planning:   Code Status: Full Code   Consults: Cardiology   Family Communication: No family at the bedside   Severity of Illness: The appropriate patient status for this patient is OBSERVATION. Observation status is judged to be reasonable and necessary in order to provide the required intensity of service to ensure the patient's safety. The patient's presenting symptoms, physical exam findings, and initial radiographic and laboratory data in the context of their medical condition is felt to place them at decreased risk for further clinical deterioration. Furthermore, it is anticipated that the patient will be medically stable for discharge from the hospital within 2 midnights of admission.   Author: Floydene Flock, MD 06/01/2023 11:13 AM  For on call review [email or web address].

## 2023-06-01 NOTE — Assessment & Plan Note (Addendum)
Rate controlled at present Continue home regimen including Toprol and ASA- no longer taking xarelto in setting of hx/o rectal bleeding  Follow cardiology recommendations

## 2023-06-01 NOTE — ED Provider Notes (Signed)
Care of this patient assumed from prior physician at 0700 pending repeat troponin, CT angio, and disposition. Please see prior physician note for further details.  Briefly this is a 77 year old male with history of CAD s/p CABG, A-fib who presented with chest pain, also reports recent dysuria and a cough.  Initial labs with stable anemia.  CBC with normal white blood cell count.  CMP with mild AKI with creatinine of 1.29.  Initial troponin negative.  Urinalysis concerning for infection with positive nitrite, large leukocyte esterase, greater than 50 white blood cells, and many bacteria.  Rocephin ordered by initial physician.  Signed out to me pending repeat troponin and CT angiogram.  Given patient's clinical history, patient was felt to be high risk with plan for likely admission.  Repeat troponin returned within normal limits.CTA of the chest without evidence of PE.  Small new opacity noted concerning for possible pneumonia.  Patient does report that he has had a cough and weakness.  Will add on azithromycin to previously ordered Rocephin.  No evidence of sepsis.  Patient does report ongoing chest pain.  Elevated heart score.  Will reach out to hospitalist team to discuss admission.  Case discussed with hospitalist team.  They will evaluate the patient for anticipated admission.   Trinna Post, MD 06/01/23 [phone]

## 2023-06-02 ENCOUNTER — Inpatient Hospital Stay: Payer: Medicare HMO

## 2023-06-02 DIAGNOSIS — I5032 Chronic diastolic (congestive) heart failure: Secondary | ICD-10-CM | POA: Diagnosis not present

## 2023-06-02 DIAGNOSIS — R0789 Other chest pain: Secondary | ICD-10-CM

## 2023-06-02 DIAGNOSIS — I480 Paroxysmal atrial fibrillation: Secondary | ICD-10-CM | POA: Diagnosis not present

## 2023-06-02 DIAGNOSIS — G8929 Other chronic pain: Secondary | ICD-10-CM | POA: Diagnosis not present

## 2023-06-02 DIAGNOSIS — R072 Precordial pain: Secondary | ICD-10-CM | POA: Diagnosis not present

## 2023-06-02 LAB — URINE DRUG SCREEN, QUALITATIVE (ARMC ONLY)
Amphetamines, Ur Screen: NOT DETECTED
Barbiturates, Ur Screen: NOT DETECTED
Benzodiazepine, Ur Scrn: NOT DETECTED
Cannabinoid 50 Ng, Ur ~~LOC~~: NOT DETECTED
Cocaine Metabolite,Ur ~~LOC~~: NOT DETECTED
MDMA (Ecstasy)Ur Screen: NOT DETECTED
Methadone Scn, Ur: NOT DETECTED
Opiate, Ur Screen: NOT DETECTED
Phencyclidine (PCP) Ur S: NOT DETECTED
Tricyclic, Ur Screen: NOT DETECTED

## 2023-06-02 LAB — LIPID PANEL
Cholesterol: 128 mg/dL (ref 0–200)
HDL: 16 mg/dL — ABNORMAL LOW (ref >40)
LDL Cholesterol: 65 mg/dL (ref 0–99)
Total CHOL/HDL Ratio: 8 {ratio}
Triglycerides: 234 mg/dL — ABNORMAL HIGH (ref <150)
VLDL: 47 mg/dL — ABNORMAL HIGH (ref 0–40)

## 2023-06-02 LAB — STREP PNEUMONIAE URINARY ANTIGEN: Strep Pneumo Urinary Antigen: NEGATIVE

## 2023-06-02 LAB — HIV ANTIBODY (ROUTINE TESTING W REFLEX): HIV Screen 4th Generation wRfx: NONREACTIVE

## 2023-06-02 LAB — CBG MONITORING, ED: Glucose-Capillary: 271 mg/dL — ABNORMAL HIGH (ref 70–99)

## 2023-06-02 MED ORDER — INSULIN NPH ISOPHANE & REGULAR (70-30) 100 UNIT/ML ~~LOC~~ SUSP: 100.0000 [IU] | Freq: Two times a day (BID) | SUBCUTANEOUS | Status: DC

## 2023-06-02 MED ORDER — FOSFOMYCIN TROMETHAMINE 3 G PO PACK
3.0000 g | PACK | Freq: Once | ORAL | Status: AC
  Administered 2023-06-02: 3 g via ORAL
  Filled 2023-06-02: qty 3

## 2023-06-02 NOTE — TOC Initial Note (Signed)
Transition of Care (TOC) - Initial/Assessment Note    Patient Details  Name: Jonathan Cross. MRN: [id number] Date of Birth: Dec 15, 1945  Transition of Care [facility]) CM/SW Contact:    Marquita Palms, LCSW Phone Number: 06/02/2023, 12:56 PM  Clinical Narrative:                  CSW was notified by nurse Dalbert Mayotte to set patient up with a PCP. CSW contacted patients daughter and the patient has an appointment with his PCP Ileene Musa MD [facility] - Juleen Starr. [phone]. CSW called to schedule follow up appointment; informed patient has an appointment at 10 am, 06/03/2023 tomorrow. Nurse informed.       Patient Goals and CMS Choice            Expected Discharge Plan and Services         Expected Discharge Date: 06/02/23                                    Prior Living Arrangements/Services                       Activities of Daily Living   ADL Screening (condition at time of admission) Independently performs ADLs?: Yes (appropriate for developmental age) Is the patient deaf or have difficulty hearing?: No Does the patient have difficulty seeing, even when wearing glasses/contacts?: No Does the patient have difficulty concentrating, remembering, or making decisions?: No  Permission Sought/Granted                  Emotional Assessment              Admission diagnosis:  Unstable angina (HCC) [I20.0] Patient Active Problem List   Diagnosis Date Noted   Unstable angina (HCC) 06/01/2023   Pneumonia 06/01/2023   UTI (urinary tract infection) 06/01/2023   Encephalopathy 06/01/2023   Rectal bleeding 05/14/2023   Pressure injury of skin 12/14/2022   Uncontrolled type 2 diabetes mellitus with hyperglycemia, with long-term current use of insulin (HCC) 12/08/2022   AKI (acute kidney injury) (HCC) 12/08/2022   Dry skin 10/21/2022   Atypical chest pain 08/11/2022   Fatigue 06/17/2022   Goals of care, counseling/discussion  06/16/2022   Myelodysplastic syndrome (HCC) 06/12/2022   Symptomatic anemia 05/01/2022   Chest pressure 05/01/2022   Chronic stable angina (HCC) 02/17/2022   History of endocarditis 11/13/2021   Chronic anemia 11/13/2021   Acute on chronic HFrEF (heart failure with reduced ejection fraction) (HCC) 11/13/2021   Paroxysmal atrial fibrillation/A-flutter (HCC) 09/12/2021   Thrombocytopenia (HCC) 09/12/2021   Vertigo, acute CVA ruled out via MRI, likely BPPV but assoc w/ ambulatory dysfunction and not responsive to conservative tx 09/11/2021   Chronic heart failure with preserved ejection fraction (HFpEF) (HCC) 06/14/2021   Atrial fibrillation (HCC) 06/02/2021   Severe sepsis (HCC) 06/02/2021   Infection of scalp 06/02/2021   Elevated troponin 06/02/2021   Pancytopenia (HCC) 06/02/2021   Cellulitis of scalp 06/02/2021   Tinea corporis    Swelling of toe of left foot 11/02/2019   Aortic valve endocarditis    Coronary artery disease    Hypoalbuminemia 06/29/2019   Hyperglycemia 06/29/2019   Lactic acidosis 06/29/2019   GERD (gastroesophageal reflux disease) 06/29/2019   Staghorn renal calculus 06/29/2019   Nonrheumatic aortic valve stenosis 05/13/2019   Hyperlipidemia LDL goal <70 05/13/2019   Preop cardiovascular exam  05/13/2019   Bacteremia due to Enterococcus 04/18/2019   Class 2 severe obesity due to excess calories with serious comorbidity and body mass index (BMI) of 38.0 to 38.9 in adult [facility]) 03/24/2018   S/P aortic valve replacement 08/07/2016   Status post coronary artery bypass graft 08/07/2016   Coronary artery disease of native artery of native heart with stable angina pectoris (HCC) 02/11/2016   SVT (supraventricular tachycardia) (HCC) 07/31/2015   Type 2 diabetes mellitus without complication, with long-term current use of insulin (HCC) 03/28/2015   Benign prostatic hyperplasia 04/07/2014   OSA (obstructive sleep apnea) 04/07/2014   Essential hypertension 05/16/2009    PCP:  Ileene Musa, MD Pharmacy:   [facility] [address] ([place]), [place] - [address] [place] ([place]) [place] [address] Phone: [phone] Fax: [phone]  [id number] [place], [place] - [address] AT [facility] [address] [place] [address] Phone: [phone] Fax: [phone]     Social Determinants of Health (SDOH) Social History: SDOH Screenings   Food Insecurity: No Food Insecurity (06/01/2023)  Housing: Low Risk  (06/01/2023)  Transportation Needs: No Transportation Needs (06/01/2023)  Utilities: Not At Risk (06/01/2023)  Financial Resource Strain: Medium Risk (02/25/2023)   Received from [facility]  Physical Activity: Inactive (04/24/2021)   Received from [facility]  Stress: No Stress Concern Present (04/24/2021)   Received from [facility]  Tobacco Use: Medium Risk (06/01/2023)  Health Literacy: High Risk (02/25/2023)   Received from [facility]   SDOH Interventions:     Readmission Risk Interventions     No data to display

## 2023-06-02 NOTE — Progress Notes (Signed)
Cardiology Progress Note   Patient Name: Jonathan Cross. Date of Encounter: 06/02/2023  Primary Cardiologist: Yvonne Kendall, MD  Subjective   Intermittent chest pain, which is his typical pain, but no more severe discomfort like what prompted presentation.  Denies diarrhea currently.  Notes good urine output on p.o. Lasix (home dose). Objective   Inpatient Medications    Scheduled Meds:  aspirin EC  81 mg Oral Daily   atorvastatin  80 mg Oral QPM   azithromycin  500 mg Oral Daily   enoxaparin (LOVENOX) injection  0.5 mg/kg Subcutaneous Q24H   furosemide  40 mg Oral BID   insulin aspart protamine- aspart  65 Units Subcutaneous BID WC   isosorbide mononitrate  15 mg Oral Daily   metoprolol succinate  25 mg Oral BID   pantoprazole  40 mg Oral Daily   ranolazine  500 mg Oral BID   Continuous Infusions:  cefTRIAXone (ROCEPHIN)  IV Stopped (06/02/23 0651)   PRN Meds: acetaminophen, ondansetron (ZOFRAN) IV   Vital Signs    Vitals:   06/02/23 0730 06/02/23 0740 06/02/23 0800 06/02/23 1109  BP: (!) 148/59  138/62 125/66  Pulse: 70  71 73  Resp: (!) 26  20 12   Temp:  98.3 F (36.8 C)    TempSrc:  Oral    SpO2: 97%  98% 94%  Weight:      Height:        Intake/Output Summary (Last 24 hours) at 06/02/2023 1304 Last data filed at 06/02/2023 0651 Gross per 24 hour  Intake 100 ml  Output --  Net 100 ml   Filed Weights   06/01/23 0358  Weight: 117.6 kg    Physical Exam   GEN: Well nourished, well developed, in no acute distress.  HEENT: Grossly normal.  Neck: Supple, obese, difficult to gauge JVP.  No bruits. Cardiac: RRR, 2/6 systolic murmur at the upper sternal borders.  No rubs or gallops.  No clubbing, cyanosis, edema.  Radials 2+, DP/PT 2+ and equal bilaterally.  Respiratory:  Respirations regular and unlabored, clear to auscultation bilaterally. GI: Soft, nontender, nondistended, BS + x 4. MS: no deformity or atrophy. Skin: warm and dry, no  rash. Neuro:  Strength and sensation are intact. Psych: AAOx3.  Normal affect.  Labs    Chemistry Recent Labs  Lab 06/01/23 0426  NA 133*  K 4.2  CL 100  CO2 23  GLUCOSE 190*  BUN 18  CREATININE 1.29*  CALCIUM 8.8*  PROT 8.0  ALBUMIN 3.3*  AST 27  ALT 23  ALKPHOS 59  BILITOT 1.2*  GFRNONAA 56*  ANIONGAP 10     Hematology Recent Labs  Lab 05/28/23 1035 06/01/23 0426  WBC  --  6.4  RBC  --  2.43*  HGB 10.0* 8.5*  HCT 31.5* 27.0*  MCV  --  111.1*  MCH  --  35.0*  MCHC  --  31.5  RDW  --  15.9*  PLT  --  176    Cardiac Enzymes  Recent Labs  Lab 06/01/23 0426 06/01/23 0750  TROPONINIHS 13 15      BNP    Component Value Date/Time   BNP 39.2 12/08/2022 1540    Lipids  Lab Results  Component Value Date   CHOL 128 06/02/2023   HDL 16 (L) 06/02/2023   LDLCALC 65 06/02/2023   TRIG 234 (H) 06/02/2023   CHOLHDL 8.0 06/02/2023    HbA1c  Lab Results  Component Value Date  HGBA1C 8.1 (H) 12/08/2022    Radiology    CT HEAD WO CONTRAST ( )  Result Date: 06/01/2023 CLINICAL DATA:  Altered mental status. EXAM: CT HEAD WITHOUT CONTRAST TECHNIQUE: Contiguous axial images were obtained from the base of the skull through the vertex without intravenous contrast. RADIATION DOSE REDUCTION: This exam was performed according to the departmental dose-optimization program which includes automated exposure control, adjustment of the mA and/or kV according to patient size and/or use of iterative reconstruction technique. COMPARISON:  Head CT dated 12/08/2022. FINDINGS: Brain: There is mild age-related atrophy and chronic microvascular ischemic changes. 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 There is no acute intracranial hemorrhage. No mass effect or midline shift. No extra-axial fluid collection. Vascular: No hyperdense vessel or unexpected calcification. Skull: Normal. Negative for fracture or focal lesion. Sinuses/Orbits: The visualized paranasal  sinuses and the right mastoid air cells are clear. There is opacification of the left mastoid air cells. Other: None IMPRESSION: 1. No acute intracranial pathology. 2. Mild age-related atrophy and chronic microvascular ischemic changes. 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 Electronically Signed   By: Elgie Collard M.D.   On: 06/01/2023 22:50   CT Angio Chest PE W/Cm &/Or Wo Cm  Result Date: 06/01/2023 CLINICAL DATA:  Pulmonary embolism (PE) suspected, high prob. Cough. Chest pain. EXAM: CT ANGIOGRAPHY CHEST WITH CONTRAST TECHNIQUE: Multidetector CT imaging of the chest was performed using the standard protocol during bolus administration of intravenous contrast. Multiplanar CT image reconstructions and MIPs were obtained to evaluate the vascular anatomy. RADIATION DOSE REDUCTION: This exam was performed according to the departmental dose-optimization program which includes automated exposure control, adjustment of the mA and/or kV according to patient size and/or use of iterative reconstruction technique. CONTRAST:  75mL OMNIPAQUE IOHEXOL 350 MG/ML SOLN COMPARISON:  CT angiography chest from 03/04/2023. FINDINGS: Cardiovascular: No evidence of embolism to the proximal subsegmental pulmonary artery level. Normal cardiac size. No pericardial effusion. No aortic aneurysm. There are coronary artery calcifications, in keeping with coronary artery disease. There are also mild-to-moderate peripheral atherosclerotic vascular calcifications of thoracic aorta and its major branches. Prosthetic aortic valve noted. Mediastinum/Nodes: Visualized thyroid gland appears grossly unremarkable. No solid / cystic mediastinal masses. The esophagus is nondistended precluding optimal assessment. There are few mildly prominent mediastinal and hilar lymph nodes, which do not meet the size criteria for lymphadenopathy and appear grossly similar to the prior study, favoring benign etiology. No axillary  lymphadenopathy by size criteria. Lungs/Pleura: The central tracheo-bronchial tree is patent. There are dependent changes in bilateral lungs. There is near complete resolution of previously seen predominantly peripheral ground-glass opacities throughout bilateral lungs. For example series 6, images 72, 82, 86, 94, etc. However, there is a new similar characteristic peripheral mixed solid/ground-glass opacity in the right lung upper lobe, laterally (series 6, image 51), which is new since the prior study. Findings again favor sequela of atypical pneumonia. Correlate clinically. No suspicious lung mass or dense consolidation. No pleural effusion or pneumothorax. No suspicious lung nodule. Upper Abdomen: Contracted gallbladder containing small volume hyperdense calculi without imaging signs of acute cholecystitis. Remaining visualized upper abdominal viscera within normal limits. Musculoskeletal: Sternotomy wires noted. The visualized soft tissues of the chest wall are otherwise grossly unremarkable. No suspicious osseous lesions. There are mild multilevel degenerative changes in the visualized spine. Review of the MIP images confirms the above findings. IMPRESSION: *No evidence of embolism to the proximal subsegmental pulmonary artery level. *Near complete resolution of previously seen predominantly peripheral ground-glass opacities throughout  bilateral lungs. There is a 1 small area of new opacity on the current exam. Findings again favor sequela of atypical pneumonia. Correlate clinically. No suspicious lung mass, dense consolidation, pleural effusion or pneumothorax. No suspicious lung nodule. *Multiple other nonacute observations, as described above. Aortic Atherosclerosis (ICD10-I70.0). Electronically Signed   By: Jules Schick M.D.   On: 06/01/2023 09:10   DG Chest 1 View  Result Date: 06/01/2023 CLINICAL DATA:  Cough and chest pain. EXAM: CHEST  1 VIEW COMPARISON:  03/04/2023 FINDINGS: Previous median  sternotomy and CABG procedure. Stable cardiomediastinal contours. Aortic atherosclerotic calcifications. No pleural fluid, interstitial edema or airspace disease. IMPRESSION: 1. No acute findings. 2. Previous CABG procedure. Electronically Signed   By: Signa Kell M.D.   On: 06/01/2023 05:45     Telemetry    rsr - Personally Reviewed  Cardiac Studies   -----------  Patient Profile     77 y.o. 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  Assessment & Plan    1.  Chronic precordial chest pain/CAD: Chronic chest pain since CABG in 2017.  Diagnostic catheterization in December 2023 with patent LIMA to LAD and vein graft to the second obtuse marginal, and otherwise moderate, diffuse, nonobstructive CAD.  Admitted with a 1 week history of significant diarrhea, dysuria, and chest pain.  Troponins normal.  No recurrence of severe chest pain though has had intermittent pain similar to what he has been experiencing since 2017.  No further ischemic evaluation is warranted at this time.  Continue current doses of antianginal therapy including beta-blocker, nitrate, and Ranexa.  QT is slightly prolonged, preventing further titration of Ranexa.  Continue aspirin and statin.  2.  Urinary tract infection: Has been having profound diarrhea and subsequently developed dysuria/polyuria.  Urinalysis showed UTI.  Antibiotics per medicine team.  3.  Paroxysmal atrial fibrillation: Maintaining sinus rhythm.  Continue beta-blocker and aspirin.  Xarelto discontinued secondary to nosebleeds and rectal bleeding in the outpatient setting.  4.  Chronic HFpEF: EF 60 to 65% by echo May 2024.  Volume  stable.  Continue home dose of Lasix.  5.  Status post AVR: Previously complicated by SBE.  Normal functioning bioprosthetic arctic valve on echo earlier this year.  6.  Hypertension: Blood pressure stable during this admission.  8.  Hyperlipidemia: Continue statin therapy.  LDL is at goal-65.  9.  MDS/anemia: Followed by hematology as outpatient.  H&H relatively stable on admission.  10.  Rectal bleeding: Patient reported rectal bleeding in the outpatient setting prompting discontinuation of Xarelto.  Oncology note references referral to GI.  11.  Chronic dyspnea/question of pneumonia: He has chronic dyspnea with abnormal CT of the chest dating back to earlier this year, which prompted discontinuation of amiodarone.  He has been followed by pulmonology at [facility].  Signed, Nicolasa Ducking, NP  06/02/2023, 1:04 PM    For questions or updates, please contact   Please consult [email or web address] for contact info under Cardiology/STEMI.

## 2023-06-02 NOTE — Discharge Summary (Signed)
Physician Discharge Summary   Patient: Jonathan Cross. MRN: [id number] DOB: 13-Jun-1946  Admit date:     06/01/2023  Discharge date: 06/02/23  Discharge Physician: Marrion Coy   PCP: Pcp, No   Recommendations at discharge:   Follow-up with cardiology in 2 weeks. TOC to arrange for PCP follow-up in 1 to 2 weeks.  Discharge Diagnoses: Principal Problem:   Unstable angina (HCC) Active Problems:   Atypical chest pain   Paroxysmal atrial fibrillation/A-flutter (HCC)   OSA (obstructive sleep apnea)   Type 2 diabetes mellitus without complication, with long-term current use of insulin (HCC)   Nonrheumatic aortic valve stenosis   GERD (gastroesophageal reflux disease)   Coronary artery disease   Chronic heart failure with preserved ejection fraction (HFpEF) (HCC)   Myelodysplastic syndrome (HCC)   Pneumonia   UTI (urinary tract infection)   Encephalopathy Unstable angina rule out, Chest pain other. Morbid obesity with BMI 38.28 with comorbidities. Chronic kidney disease stage II. Resolved Problems:   * No resolved hospital problems. *  Hospital Course: Jonathan Cross. is a 77 y.o. 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  Patient reports nonspecific central chest pain over the past 3 to 4 days.  He is seen by cardiology, troponin negative, CT angiogram did not show PE.  Discussed with Dr. Okey Dupre, patient has chronic recurrent chest pain, prior chronic angiogram performed late last year not show significant occlusion.  At this point, no additional workup is indicated.  Patient will be stable for discharge.  Assessment and Plan: Atypical chest pain Discussed with cardiology, no concern for angina.  Medically stable for  discharge.   Paroxysmal atrial fibrillation/A-flutter [name]) Patient history of a rectal bleeding, continue hold Xarelto per cardiology recommendation.  Acute metabolic encephalopathy UTI (urinary tract infection) Urinalysis indicative of infection with noted dysuria Received Rocephin, patient does not have sepsis, will give a dose of fosfomycin before discharge.  Pneumonia ruled out. Reviewed CT scan, no evidence of pneumonia.  Patient had a prior pneumonia, which has essentially resolved on CT scan last night.  Patient does not have significant respiratory symptoms.  Procalcitonin level less than 0.1.  No need of antibiotics.  Myelodysplastic syndrome (HCC) Hemoglobin 8.5 today which appears to be near baseline Follow-up with oncology as a previous scheduled.  Chronic heart failure with preserved ejection fraction (HFpEF) (HCC) 2D echo May 2024 with EF of 60 to 65% and grade 1 diastolic dysfunction Appears euvolemic at present Follow-up with cardiology as outpatient.  Coronary artery disease Baseline CAD followed by [facility] cardiology  Noted evaluations for recurring CP in the past  Had patent grafts at his cath 06/2022  Trop neg x2 today  EKG NSR  Follow-up with cardiology as outpatient.   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  Glucose running high while patient was placed on 65 units.  Discussed with the patient, he will resume home dose NPH.  OSA (obstructive sleep apnea) Morbid obesity. CPAP        Consultants: Cardiology Procedures performed: None  Disposition: Home Diet recommendation:  Discharge Diet Orders (From admission, onward)     Start     Ordered   06/02/23 0000  Diet - low sodium heart healthy         06/02/23 1057           Cardiac diet DISCHARGE MEDICATION: Allergies as of 06/02/2023       Reactions   Metformin Diarrhea   Loose stools even with XR        Medication List     STOP taking these medications    rivaroxaban 20 MG Tabs tablet Commonly known as: XARELTO       TAKE these medications    aspirin EC 81 MG tablet Take 81 mg by mouth daily. Swallow whole.   atorvastatin 80 MG tablet Commonly known as: LIPITOR Take 1 tablet (80 mg total) by mouth every evening.   furosemide 40 MG tablet Commonly known as: LASIX Take 1 tablet (40 mg total) by mouth 2 (two) times daily.   insulin NPH-regular Human (70-30) 100 UNIT/ML injection Inject 100 Units into the skin 2 (two) times daily. What changed: how much to take   isosorbide mononitrate 30 MG 24 hr tablet Commonly known as: IMDUR Take 0.5 tablets (15 mg total) by mouth daily.   metoprolol succinate 25 MG 24 hr tablet Commonly known as: TOPROL-XL Take 1 tablet (25 mg total) by mouth 2 (two) times daily.   nitroGLYCERIN 0.4 MG SL tablet Commonly known as: NITROSTAT Place 1 tablet (0.4 mg total) under the tongue every 5 (five) minutes x 3 doses as needed for chest pain.   omeprazole 20 MG capsule Commonly known as: PRILOSEC Take 2 capsules by mouth once daily   ranolazine 500 MG 12 hr tablet Commonly known as: RANEXA Take 1 tablet by mouth twice daily   tamsulosin 0.4 MG Caps capsule Commonly known as: FLOMAX Take 0.4 mg by mouth daily.        Follow-up Information     End, Jonathan Deer, MD Follow up in 2 week(s).   Specialty: Cardiology Contact information: [address] [place] [address] [phone]                Discharge Exam: Jonathan Cross Weights   06/01/23 0358  Weight: 117.6 kg   General exam: Appears calm and comfortable, morbid obese. Respiratory system: Clear to auscultation. Respiratory effort normal. Cardiovascular system: S1 & S2 heard, RRR. No  JVD, murmurs, rubs, gallops or clicks. No pedal edema. Gastrointestinal system: Abdomen is nondistended, soft and nontender. No organomegaly or masses felt. Normal bowel sounds heard. Central nervous system: Alert and oriented x3. No focal neurological deficits. Extremities: Symmetric 5 x 5 power. Skin: No rashes, lesions or ulcers Psychiatry: Judgement and insight appear normal. Mood & affect appropriate.    Condition at discharge: fair  The results of significant diagnostics from this hospitalization (including imaging, microbiology, ancillary and laboratory) are listed below for reference.   Imaging Studies: CT HEAD WO CONTRAST ( )  Result Date: 06/01/2023 CLINICAL DATA:  Altered mental status. EXAM: CT HEAD WITHOUT CONTRAST TECHNIQUE: Contiguous axial images were obtained from the base of the skull through the vertex without intravenous contrast. RADIATION DOSE REDUCTION: This exam was performed according to the departmental dose-optimization program which includes automated exposure control, adjustment of the mA and/or kV according to patient size and/or use of iterative reconstruction technique. COMPARISON:  Head CT dated 12/08/2022. FINDINGS: Brain: There is mild age-related atrophy and chronic microvascular ischemic changes. 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 There is no acute intracranial hemorrhage. No mass effect or midline shift. No extra-axial fluid collection. Vascular: No hyperdense vessel or unexpected calcification. Skull: Normal. Negative for fracture or focal lesion. Sinuses/Orbits: The visualized paranasal sinuses and the right mastoid air cells are clear. There is opacification of the left mastoid air cells. Other: None IMPRESSION: 1. No acute intracranial pathology. 2. Mild age-related atrophy and chronic microvascular ischemic changes. 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 Electronically Signed   By: Elgie Collard M.D.   On:  06/01/2023 22:50   CT Angio Chest PE W/Cm &/Or Wo Cm  Result Date: 06/01/2023 CLINICAL DATA:  Pulmonary embolism (PE) suspected, high prob. Cough. Chest pain. EXAM: CT ANGIOGRAPHY CHEST WITH CONTRAST TECHNIQUE: Multidetector CT imaging of the chest was performed using the standard protocol during bolus administration of intravenous contrast. Multiplanar CT image reconstructions and MIPs were obtained to evaluate the vascular anatomy. RADIATION DOSE REDUCTION: This exam was performed according to the departmental dose-optimization program which includes automated exposure control, adjustment of the mA and/or kV according to patient size and/or use of iterative reconstruction technique. CONTRAST:  75mL OMNIPAQUE IOHEXOL 350 MG/ML SOLN COMPARISON:  CT angiography chest from 03/04/2023. FINDINGS: Cardiovascular: No evidence of embolism to the proximal subsegmental pulmonary artery level. Normal cardiac size. No pericardial effusion. No aortic aneurysm. There are coronary artery calcifications, in keeping with coronary artery disease. There are also mild-to-moderate peripheral atherosclerotic vascular calcifications of thoracic aorta and its major branches. Prosthetic aortic valve noted. Mediastinum/Nodes: Visualized thyroid gland appears grossly unremarkable. No solid / cystic mediastinal masses. The esophagus is nondistended precluding optimal assessment. There are few mildly prominent mediastinal and hilar lymph nodes, which do not meet the size criteria for lymphadenopathy and appear grossly similar to the prior study, favoring benign etiology. No axillary lymphadenopathy by size criteria. Lungs/Pleura: The central tracheo-bronchial tree is patent. There are dependent changes in bilateral lungs. There is near complete resolution of previously seen predominantly peripheral ground-glass opacities throughout bilateral lungs. For example series 6, images 72, 82, 86, 94, etc. However, there is a new similar  characteristic peripheral mixed solid/ground-glass opacity in the right lung upper lobe, laterally (series 6, image 51), which is new since the prior study. Findings again favor sequela of atypical pneumonia. Correlate clinically. No suspicious lung mass or dense consolidation. No pleural effusion or pneumothorax. No suspicious lung nodule. Upper Abdomen: Contracted gallbladder containing small volume hyperdense calculi without imaging signs of acute cholecystitis. Remaining visualized upper abdominal viscera within normal limits. Musculoskeletal: Sternotomy wires noted. The visualized soft tissues of the chest wall are otherwise grossly unremarkable. No suspicious osseous lesions. There are mild multilevel degenerative changes in the visualized spine. Review of the MIP images confirms the above findings. IMPRESSION: *No evidence of embolism to the proximal subsegmental pulmonary artery level. *Near complete resolution of previously seen predominantly peripheral ground-glass opacities throughout bilateral lungs. There is a 1 small area of new opacity on the current exam. Findings again favor sequela of atypical pneumonia. Correlate clinically. No suspicious lung mass, dense consolidation, pleural effusion or pneumothorax. No suspicious lung nodule. *Multiple other nonacute observations, as described above. Aortic Atherosclerosis (ICD10-I70.0). Electronically Signed   By: Jules Schick M.D.   On: 06/01/2023 09:10   DG Chest 1 View  Result Date: 06/01/2023 CLINICAL DATA:  Cough and chest pain. EXAM: CHEST  1 VIEW COMPARISON:  03/04/2023 FINDINGS: Previous median sternotomy and CABG procedure. Stable cardiomediastinal contours. Aortic atherosclerotic calcifications. No pleural fluid, interstitial  edema or airspace disease. IMPRESSION: 1. No acute findings. 2. Previous CABG procedure. Electronically Signed   By: Signa Kell M.D.   On: 06/01/2023 05:45    Microbiology: Results for orders placed or performed  during the hospital encounter of 06/01/23  Resp panel by RT-PCR (RSV, Flu A&B, Covid) Anterior Nasal Swab     Status: None   Collection Time: 06/01/23  5:33 AM   Specimen: Anterior Nasal Swab  Result Value Ref Range Status   SARS Coronavirus 2 by RT PCR NEGATIVE NEGATIVE Final    Comment: (NOTE) SARS-CoV-2 target nucleic acids are NOT DETECTED.  The SARS-CoV-2 RNA is generally detectable in upper respiratory specimens during the acute phase of infection. The lowest concentration of SARS-CoV-2 viral copies this assay can detect is 138 copies/mL. A negative result does not preclude SARS-Cov-2 infection and should not be used as the sole basis for treatment or other patient management decisions. A negative result may occur with  improper specimen collection/handling, submission of specimen other than nasopharyngeal swab, presence of viral mutation(s) within the areas targeted by this assay, and inadequate number of viral copies(<138 copies/mL). A negative result must be combined with clinical observations, patient history, and epidemiological information. The expected result is Negative.  Fact Sheet for Patients:  BloggerCourse.com  Fact Sheet for Healthcare Providers:  SeriousBroker.it  This test is no t yet approved or cleared by the [place] FDA and  has been authorized for detection and/or diagnosis of SARS-CoV-2 by FDA under an Emergency Use Authorization (EUA). This EUA will remain  in effect (meaning this test can be used) for the duration of the COVID-19 declaration under Section 564(b)(1) of the Act, 21 U.S.C.section 360bbb-3(b)(1), unless the authorization is terminated  or revoked sooner.       Influenza A by PCR NEGATIVE NEGATIVE Final   Influenza B by PCR NEGATIVE NEGATIVE Final    Comment: (NOTE) The Xpert Xpress SARS-CoV-2/FLU/RSV plus assay is intended as an aid in the diagnosis of influenza from  Nasopharyngeal swab specimens and should not be used as a sole basis for treatment. Nasal washings and aspirates are unacceptable for Xpert Xpress SARS-CoV-2/FLU/RSV testing.  Fact Sheet for Patients: BloggerCourse.com  Fact Sheet for Healthcare Providers: SeriousBroker.it  This test is not yet approved or cleared by the [place] FDA and has been authorized for detection and/or diagnosis of SARS-CoV-2 by FDA under an Emergency Use Authorization (EUA). This EUA will remain in effect (meaning this test can be used) for the duration of the COVID-19 declaration under Section 564(b)(1) of the Act, 21 U.S.C. section 360bbb-3(b)(1), unless the authorization is terminated or revoked.     Resp Syncytial Virus by PCR NEGATIVE NEGATIVE Final    Comment: (NOTE) Fact Sheet for Patients: BloggerCourse.com  Fact Sheet for Healthcare Providers: SeriousBroker.it  This test is not yet approved or cleared by the [place] FDA and has been authorized for detection and/or diagnosis of SARS-CoV-2 by FDA under an Emergency Use Authorization (EUA). This EUA will remain in effect (meaning this test can be used) for the duration of the COVID-19 declaration under Section 564(b)(1) of the Act, 21 U.S.C. section 360bbb-3(b)(1), unless the authorization is terminated or revoked.  Performed at [facility], [address] [place], [place] [address]   Culture, blood (routine x 2) Call MD if unable to obtain prior to antibiotics being given     Status: None (Preliminary result)   Collection Time: 06/01/23 10:05 PM   Specimen: BLOOD  Result Value Ref Range Status   Specimen Description BLOOD BLOOD LEFT HAND  Final   Special Requests   Final    BOTTLES DRAWN AEROBIC AND ANAEROBIC Blood Culture adequate volume   Culture   Final    NO GROWTH < 12 HOURS Performed at [facility],  [address], [place], [place] [address]    Report Status PENDING  Incomplete    Labs: CBC: Recent Labs  Lab 05/28/23 1035 06/01/23 0426  WBC  --  6.4  NEUTROABS  --  3.7  HGB 10.0* 8.5*  HCT 31.5* 27.0*  MCV  --  111.1*  PLT  --  176   Basic Metabolic Panel: Recent Labs  Lab 06/01/23 0426  NA 133*  K 4.2  CL 100  CO2 23  GLUCOSE 190*  BUN 18  CREATININE 1.29*  CALCIUM 8.8*   Liver Function Tests: Recent Labs  Lab 06/01/23 0426  AST 27  ALT 23  ALKPHOS 59  BILITOT 1.2*  PROT 8.0  ALBUMIN 3.3*   CBG: Recent Labs  Lab 06/02/23 0947  GLUCAP 271*    Discharge time spent: greater than 30 minutes.  Signed: Marrion Coy, MD [facility] 06/02/2023

## 2023-06-03 LAB — LEGIONELLA PNEUMOPHILA SEROGP 1 UR AG: L. pneumophila Serogp 1 Ur Ag: NEGATIVE

## 2023-06-04 ENCOUNTER — Inpatient Hospital Stay: Payer: Medicare HMO

## 2023-06-04 VITALS — BP 134/70 | HR 69 | Temp 96.9°F | Resp 18

## 2023-06-04 DIAGNOSIS — D469 Myelodysplastic syndrome, unspecified: Secondary | ICD-10-CM | POA: Diagnosis not present

## 2023-06-04 LAB — URINE CULTURE: Culture: >=100000 — AB

## 2023-06-04 MED ORDER — SODIUM CHLORIDE 0.9% FLUSH
10.0000 mL | Freq: Once | INTRAVENOUS | Status: AC | PRN
  Administered 2023-06-04: 10 mL
  Filled 2023-06-04: qty 10

## 2023-06-04 MED ORDER — IRON SUCROSE 20 MG/ML IV SOLN
200.0000 mg | Freq: Once | INTRAVENOUS | Status: AC
  Administered 2023-06-04: 200 mg via INTRAVENOUS
  Filled 2023-06-04: qty 10

## 2023-06-04 MED ORDER — SODIUM CHLORIDE 0.9 % IV SOLN
Freq: Once | INTRAVENOUS | Status: AC
  Filled 2023-06-04: qty 250

## 2023-06-04 NOTE — Progress Notes (Signed)
1420: Pt at the end of 30 minute post venofer observation and complains of "sharp left chest pain" that is different than his normal angina. Consuello Masse NP notified.  NS started and EKG performed per Consuello Masse NP.  1435: Consuello Masse NP reviewed EKG.  Per Consuello Masse NP give pt 200cc of NS at a rate of 500-965ml/hr, and then okay to discharge home. Left Chest pain now rated a 1 out of 10 per patient.  1451: Pt educated to go to the Emergency Room per Dr. Cathie Hoops, pt refuses. Pt educated to seek emergency care if symptoms worsens, pt verbalizes understanding. Pt rates pain at a 0.5 out of 10, pt grasping at left chest, but continues to refuse to go to the ER. Per Dr. Ulyess Blossom NP will reach out to Cardiology.

## 2023-06-06 LAB — CULTURE, BLOOD (ROUTINE X 2)
Culture: NO GROWTH
Special Requests: ADEQUATE

## 2023-06-09 ENCOUNTER — Other Ambulatory Visit: Payer: Medicare HMO

## 2023-06-09 ENCOUNTER — Inpatient Hospital Stay: Payer: Medicare HMO

## 2023-06-09 VITALS — BP 148/61 | HR 84 | Temp 96.8°F | Resp 18

## 2023-06-09 DIAGNOSIS — D469 Myelodysplastic syndrome, unspecified: Secondary | ICD-10-CM | POA: Diagnosis not present

## 2023-06-09 MED ORDER — IRON SUCROSE 20 MG/ML IV SOLN
200.0000 mg | Freq: Once | INTRAVENOUS | Status: AC
  Administered 2023-06-09: 200 mg via INTRAVENOUS

## 2023-06-09 NOTE — Patient Instructions (Signed)
Iron Sucrose Injection What is this medication? IRON SUCROSE (EYE ern SOO krose) treats low levels of iron (iron deficiency anemia) in people with kidney disease. Iron is a mineral that plays an important role in making red blood cells, which carry oxygen from your lungs to the rest of your body. This medicine may be used for other purposes; ask your health care provider or pharmacist if you have questions. COMMON BRAND NAME(S): Venofer What should I tell my care team before I take this medication? They need to know if you have any of these conditions: Anemia not caused by low iron levels Heart disease High levels of iron in the blood Kidney disease Liver disease An unusual or allergic reaction to iron, other medications, foods, dyes, or preservatives Pregnant or trying to get pregnant Breastfeeding How should I use this medication? This medication is for infusion into a vein. It is given in a hospital or clinic setting. Talk to your care team about the use of this medication in children. While this medication may be prescribed for children as young as 2 years for selected conditions, precautions do apply. Overdosage: If you think you have taken too much of this medicine contact a poison control center or emergency room at once. NOTE: This medicine is only for you. Do not share this medicine with others. What if I miss a dose? Keep appointments for follow-up doses. It is important not to miss your dose. Call your care team if you are unable to keep an appointment. What may interact with this medication? Do not take this medication with any of the following: Deferoxamine Dimercaprol Other iron products This medication may also interact with the following: Chloramphenicol Deferasirox This list may not describe all possible interactions. Give your health care provider a list of all the medicines, herbs, non-prescription drugs, or dietary supplements you use. Also tell them if you smoke,  drink alcohol, or use illegal drugs. Some items may interact with your medicine. What should I watch for while using this medication? Visit your care team regularly. Tell your care team if your symptoms do not start to get better or if they get worse. You may need blood work done while you are taking this medication. You may need to follow a special diet. Talk to your care team. Foods that contain iron include: whole grains/cereals, dried fruits, beans, or peas, leafy green vegetables, and organ meats (liver, kidney). What side effects may I notice from receiving this medication? Side effects that you should report to your care team as soon as possible: Allergic reactions--skin rash, itching, hives, swelling of the face, lips, tongue, or throat Low blood pressure--dizziness, feeling faint or lightheaded, blurry vision Shortness of breath Side effects that usually do not require medical attention (report to your care team if they continue or are bothersome): Flushing Headache Joint pain Muscle pain Nausea Pain, redness, or irritation at injection site This list may not describe all possible side effects. Call your doctor for medical advice about side effects. You may report side effects to FDA at 1-800-FDA-1088. Where should I keep my medication? This medication is given in a hospital or clinic. It will not be stored at home. NOTE: This sheet is a summary. It may not cover all possible information. If you have questions about this medicine, talk to your doctor, pharmacist, or health care provider.  2024 Elsevier/Gold Standard (2022-12-05 00:00:00)

## 2023-06-16 ENCOUNTER — Other Ambulatory Visit: Payer: Medicare HMO

## 2023-06-16 ENCOUNTER — Inpatient Hospital Stay: Payer: Medicare HMO | Attending: Oncology

## 2023-06-16 VITALS — BP 126/64 | HR 72 | Temp 96.7°F | Resp 20

## 2023-06-16 DIAGNOSIS — D469 Myelodysplastic syndrome, unspecified: Secondary | ICD-10-CM | POA: Insufficient documentation

## 2023-06-16 DIAGNOSIS — Z79899 Other long term (current) drug therapy: Secondary | ICD-10-CM | POA: Insufficient documentation

## 2023-06-16 MED ORDER — IRON SUCROSE 20 MG/ML IV SOLN
200.0000 mg | Freq: Once | INTRAVENOUS | Status: AC
  Administered 2023-06-16: 200 mg via INTRAVENOUS

## 2023-06-16 MED ORDER — SODIUM CHLORIDE 0.9% FLUSH
10.0000 mL | Freq: Once | INTRAVENOUS | Status: AC | PRN
  Administered 2023-06-16: 10 mL
  Filled 2023-06-16: qty 10

## 2023-06-18 ENCOUNTER — Inpatient Hospital Stay: Payer: Medicare HMO

## 2023-06-18 VITALS — BP 111/61

## 2023-06-18 DIAGNOSIS — D469 Myelodysplastic syndrome, unspecified: Secondary | ICD-10-CM | POA: Diagnosis not present

## 2023-06-18 LAB — CBC WITH DIFFERENTIAL (CANCER CENTER ONLY)
Abs Immature Granulocytes: 0.11 10*3/uL — ABNORMAL HIGH (ref 0.00–0.07)
Basophils Absolute: 0 10*3/uL (ref 0.0–0.1)
Basophils Relative: 0 %
Eosinophils Absolute: 0.1 10*3/uL (ref 0.0–0.5)
Eosinophils Relative: 1 %
HCT: 37.9 % — ABNORMAL LOW (ref 39.0–52.0)
Hemoglobin: 12.1 g/dL — ABNORMAL LOW (ref 13.0–17.0)
Immature Granulocytes: 1 %
Lymphocytes Relative: 30 %
Lymphs Abs: 3.2 10*3/uL (ref 0.7–4.0)
MCH: 35.7 pg — ABNORMAL HIGH (ref 26.0–34.0)
MCHC: 31.9 g/dL (ref 30.0–36.0)
MCV: 111.8 fL — ABNORMAL HIGH (ref 80.0–100.0)
Monocytes Absolute: 1.1 10*3/uL — ABNORMAL HIGH (ref 0.1–1.0)
Monocytes Relative: 11 %
Neutro Abs: 6 10*3/uL (ref 1.7–7.7)
Neutrophils Relative %: 57 %
Platelet Count: 123 10*3/uL — ABNORMAL LOW (ref 150–400)
RBC: 3.39 MIL/uL — ABNORMAL LOW (ref 4.22–5.81)
RDW: 18.5 % — ABNORMAL HIGH (ref 11.5–15.5)
WBC Count: 10.5 10*3/uL (ref 4.0–10.5)
nRBC: 0 % (ref 0.0–0.2)

## 2023-06-18 MED ORDER — EPOETIN ALFA-EPBX 20000 UNIT/ML IJ SOLN: 20000.0000 [IU] | Freq: Once | INTRAMUSCULAR | Status: DC

## 2023-06-23 ENCOUNTER — Inpatient Hospital Stay: Payer: Medicare HMO

## 2023-06-23 VITALS — BP 138/78 | HR 76 | Temp 97.2°F | Resp 18

## 2023-06-23 DIAGNOSIS — D469 Myelodysplastic syndrome, unspecified: Secondary | ICD-10-CM

## 2023-06-23 MED ORDER — IRON SUCROSE 20 MG/ML IV SOLN
200.0000 mg | Freq: Once | INTRAVENOUS | Status: AC
  Administered 2023-06-23: 200 mg via INTRAVENOUS
  Filled 2023-06-23: qty 10

## 2023-06-24 ENCOUNTER — Encounter: Payer: Self-pay | Admitting: Nurse Practitioner

## 2023-06-24 ENCOUNTER — Ambulatory Visit: Payer: Medicare HMO | Attending: Nurse Practitioner | Admitting: Nurse Practitioner

## 2023-06-24 VITALS — BP 126/74 | HR 70 | Ht 69.0 in | Wt 255.0 lb

## 2023-06-24 DIAGNOSIS — I25118 Atherosclerotic heart disease of native coronary artery with other forms of angina pectoris: Secondary | ICD-10-CM | POA: Diagnosis not present

## 2023-06-24 DIAGNOSIS — D469 Myelodysplastic syndrome, unspecified: Secondary | ICD-10-CM

## 2023-06-24 DIAGNOSIS — I480 Paroxysmal atrial fibrillation: Secondary | ICD-10-CM

## 2023-06-24 DIAGNOSIS — Z952 Presence of prosthetic heart valve: Secondary | ICD-10-CM

## 2023-06-24 DIAGNOSIS — I10 Essential (primary) hypertension: Secondary | ICD-10-CM

## 2023-06-24 DIAGNOSIS — E785 Hyperlipidemia, unspecified: Secondary | ICD-10-CM

## 2023-06-24 DIAGNOSIS — E119 Type 2 diabetes mellitus without complications: Secondary | ICD-10-CM

## 2023-06-24 DIAGNOSIS — Z794 Long term (current) use of insulin: Secondary | ICD-10-CM

## 2023-06-24 NOTE — Progress Notes (Signed)
Ever since    Office Visit    Patient Name: Jonathan Cross. Date of Encounter: 06/24/2023  Primary Care Provider:  Ileene Musa, MD Primary Cardiologist:  Yvonne Kendall, MD  Chief Complaint    77 y.o. male  with a history of CAD status post CABG x2 (LIMA to the LAD, vein graft to the obtuse marginal), severe aortic stenosis status post bioprosthetic aortic valve replacement complicated by endocarditis, paroxysmal atrial fibrillation, hypertension, hyperlipidemia, type 2 diabetes mellitus, sleep apnea, and BPH, who presents for follow-up after recent admission for chest pain and normal troponins.  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 04/2022 Echo: EF 60-65%, GrII DD; b. 06/2022 RHC: PA 50/20(30), PCWP 25; c. 11/2022 Echo: EF 60-65%, mild LVH, GrI DD. mildly reduced RV fxn, mild MR, nl fxn'ing bioprosthetic AoV; d. 01/2023 Echo: EF 60-65%, nl fxn'ing AoV, prob mild AS. Nl RV fxn.   Coronary artery disease    a. 01/2016 CABG x 2: LIMA->LAD, VG->OM; b. 05/2021 MV: small, mild, rev apical lateral and apical inf defects ->subtle ischemia vs artifact, EF 55-65%-->low risk; c. 06/2022 Cath: LM 20d, LAD 80ost, 60p/m, 6m, D1 mild dzs, LCX 50ost/p, OM1 mod dzs, RCA mild diff dzs, RPL1 75, VG->OM2 & LIMA->LAD patent--> Med Rx.   CVA, old, disturbances of vision    Diabetes (HCC)    H/O aortic valve replacement    a. 01/2016 s/p bioprosthetic AVR @ [facility]; b. 05/2021 Echo: EF 55-60%, nl fxn of bioprosthetic AVR; c. 04/2022 Echo: EF 60-65%, nl fxn'ing AoV; d. 11/2022 Echo: nl fxn'ing AoV; e. 01/2023 Echo: nl fxn'ing Aov, mean grad .   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 CHA2DS2VASc = 5-->eliquis/amio; b. 01/2023 Amio d/c'd due to concern re: lung toxicity/ongoing dyspnea.   Retinal vascular occlusion of left eye    Several years ago   Sleep apnea    BiPAP   Vertigo    Wears dentures    full upper and lower.  Lower "broken"   Past Surgical History:  Procedure Laterality Date   AORTIC VALVE REPLACEMENT  2017   [facility], bioprosthetic   APPENDECTOMY     BONE MARROW BIOPSY     05/2022   CARDIAC VALVE REPLACEMENT     CATARACT EXTRACTION W/PHACO Right 11/12/2022   Procedure: CATARACT EXTRACTION PHACO AND INTRAOCULAR LENS PLACEMENT (IOC) RIGHT DIABETIC MALYUGIN  13.49  01:06.1;  Surgeon: Lockie Mola, MD;  Location: [facility];  Service: Ophthalmology;  Laterality: Right;  Diabetic   CORONARY ARTERY BYPASS GRAFT  2017   [facility] - LIMA-LAD and SVG-OM   CYSTOSCOPY W/ RETROGRADES Right 05/27/2019   Procedure: CYSTOSCOPY WITH RETROGRADE PYELOGRAM;  Surgeon: Sondra Come, MD;  Location: [facility];  Service: Urology;  Laterality: Right;   CYSTOSCOPY W/ RETROGRADES Right 07/11/2019   Procedure: CYSTOSCOPY WITH RETROGRADE PYELOGRAM;  Surgeon: Sondra Come, MD;  Location: [facility];  Service: Urology;  Laterality: Right;   CYSTOSCOPY/URETEROSCOPY/HOLMIUM LASER/STENT PLACEMENT Right 05/27/2019   Procedure: CYSTOSCOPY/URETEROSCOPY/HOLMIUM LASER/STENT PLACEMENT;  Surgeon: Sondra Come, MD;  Location: [facility];  Service: Urology;  Laterality: Right;  CYSTOSCOPY/URETEROSCOPY/HOLMIUM LASER/STENT PLACEMENT Right 06/17/2019   Procedure: CYSTOSCOPY/URETEROSCOPY/HOLMIUM LASER/STENT Exchange;  Surgeon: Sondra Come, MD;  Location: [facility];  Service: Urology;  Laterality: Right;   CYSTOSCOPY/URETEROSCOPY/HOLMIUM LASER/STENT PLACEMENT Right 07/11/2019   Procedure: CYSTOSCOPY/URETEROSCOPY/HOLMIUM LASER/STENT Exchange;  Surgeon: Sondra Come, MD;  Location: [facility];  Service: Urology;  Laterality: Right;   RIGHT HEART CATH AND  CORONARY/GRAFT ANGIOGRAPHY Bilateral 06/17/2022   Procedure: RIGHT HEART CATH AND CORONARY/GRAFT ANGIOGRAPHY;  Surgeon: Yvonne Kendall, MD;  Location: [facility];  Service: Cardiovascular;  Laterality: Bilateral;   STONE EXTRACTION WITH BASKET Right 07/11/2019   Procedure: STONE EXTRACTION WITH BASKET;  Surgeon: Sondra Come, MD;  Location: [facility];  Service: Urology;  Laterality: Right;   TEE WITHOUT CARDIOVERSION N/A 04/21/2019   Procedure: TRANSESOPHAGEAL ECHOCARDIOGRAM (TEE);  Surgeon: Antonieta Iba, MD;  Location: [facility];  Service: Cardiovascular;  Laterality: N/A;   TEE WITHOUT CARDIOVERSION N/A 07/04/2019   Procedure: TRANSESOPHAGEAL ECHOCARDIOGRAM (TEE);  Surgeon: Iran Ouch, MD;  Location: [facility];  Service: Cardiovascular;  Laterality: N/A;   TEE WITHOUT CARDIOVERSION N/A 08/17/2019   Procedure: TRANSESOPHAGEAL ECHOCARDIOGRAM (TEE);  Surgeon: Debbe Odea, MD;  Location: [facility];  Service: Cardiovascular;  Laterality: N/A;   TEE WITHOUT CARDIOVERSION N/A 11/15/2019   Procedure: TRANSESOPHAGEAL ECHOCARDIOGRAM (TEE);  Surgeon: Yvonne Kendall, MD;  Location: [facility];  Service: Cardiovascular;  Laterality: N/A;   TONSILLECTOMY      Allergies  Allergies  Allergen Reactions   Metformin Diarrhea    Loose stools even with XR      History of Present Illness      77 y.o. y/o male with above past medical history following CAD status post CABG x2, severe aortic stenosis status post bioprosthetic aortic valve replacement, bacterial endocarditis, paroxysmal atrial fibrillation, hypertension, hyperlipidemia, type 2 diabetes mellitus, sleep apnea, and BPH.  He was previously followed at [facility] in the setting of severe aortic stenosis and underwent bioprosthetic aortic valve replacement two-vessel coronary artery bypass grafting in July 2017.  Between October 2020 in May 2021, he had multiple admissions related to fever, malaise, and Enterococcus faecalis  bacteremia.  TEEs in October 2020 and December 2020 were negative for vegetation.  In early 2021, he was found to have a mobile mass on his prosthetic aortic valve and was seen by infectious disease treated with appropriate antibiotics for 6 weeks in the outpatient setting.  Prior to PICC line removal, repeat TEE in May 2021 continued to show an echodensity that appeared less mobile but larger, measuring 1.1 x 0.6 cm.  Perivalvular abscess could not be excluded.  He was transferred to [facility] and underwent cardiac CT which showed evidence of a small mass in the leaflet of the noncoronary cusp, likely representing a healed vegetation.  Leaflets were freely movable without evidence of obstruction.  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  The LIMA to the LAD and vein graft to the obtuse marginal were patent.  He was seen by CT surgery who recommended continued observation and antibiotic therapy.  In November 2022, he was admitted with dyspnea and chest fullness and found to be in rapid atrial fibrillation, which converted to sinus rhythm on intravenous amiodarone.  He underwent stress testing in the setting of chest pain, which showed a small, mild, reversible apical lateral and apical inferior defect, with question of subtle ischemia versus infarct.  Ultimately, it was felt to be  a low risk study and he was medically managed.    In October 2023, he was admitted to [facility] regional with chest pain and mild troponin elevation (32  32).  He was found to be anemic and received 1 unit of packed red blood cells.  Echo showed normal LV function with normal functioning bioprosthetic aortic valve.  Eliquis was held and subsequently resumed in the outpatient setting.  It was felt he would require diagnostic catheterization following anemia workup.  He was subsequently seen by hematology with plan for bone marrow biopsy and diagnosed with  myelodysplastic syndrome.  Due to escalating dyspnea and angina, he underwent diagnostic catheterization and December 2023, with moderate, diffuse multivessel disease with patent LIMA to the LAD and vein graft to the OM 2.  Filling pressures were elevated with a PA of 50/20 and wedge of 25.  Cardiac output/index were normal.  Outpatient diuretic therapy was escalated and he was otherwise medically managed.  He was readmitted in May 2024 with atypical chest pain, normal cardiac enzymes, and volume overload requiring intravenous diuresis.  Echo during that admission showed an EF of 60 to 65% with grade 1 diastolic dysfunction, and normal functioning aortic valve bioprosthesis with a mean gradient of 5 mmHg.  He was subsequently readmitted to [facility] in July 2024 with ongoing dyspnea.  CT of the chest notable for bilateral parenchymal abnormalities largely peribronchial vascular with question of organizing pneumonia versus other idiopathic related pulmonary toxicity.  He was seen by pulmonology.  Amiodarone was discontinued and he was treated with antibiotics.  Repeat echo showed normal LV function and normal functioning bioprosthetic valve with probable mild prosthetic valve stenosis-mean gradient of 23 mmHg.  He was subsequently discharged and has been following up with primary care.  Recent CT chest (03/30/2023) showed increased right lung greater than left peripheral predominant airspace disease possibly representing worsening organizing pneumonia though infection considered less likely etiology.     Mr. Huska was admitted to [facility] regional in mid November 2024 in the setting of acute on chronic chest pain, diarrhea, malaise, fatigue, polyuria, dysuria, and rectal bleeding prompting him to discontinue his home dose of Xarelto.  ECG showed no acute abnormalities.  Troponins were normal at 13 and 15.  Creatinine was slightly above prior baseline at 1.29 while H&H were relatively stable at 8.5 and 27 respectively.   Urinalysis suggested UTI.  CT of the chest was negative for PE but showed new peripheral mixed solid/groundglass opacity in the right upper lobe lobe favoring sequela of atypical pneumonia.  He was placed on azithromycin and ceftriaxone.  In the absence of objective evidence of ischemia, no further ischemic evaluation was felt to be warranted.  He has since followed up with pulmonology at [facility] with a plan to continue prednisone therapy for at least 3 more months with subsequent follow-up with PFTs.  There is concern for interstitial lung disease.  Today, Mr. Taute notes that he is generally doing better.  He has had much less frequency of chest pain, though he did have an episode this morning lasting about 2 hours and improving after drinking some coffee and eating breakfast.  This pain is similar to what he is experienced over the past 7 years.  He says that since starting iron infusions through the cancer center, he has significantly more energy and feels like he can do his own grocery shopping.  He does have chronic, stable dyspnea on exertion but this too has improved.  He denies palpitations, PND,  orthopnea, dizziness, syncope, edema, or early satiety. Objective  Home Medications    Current Outpatient Medications  Medication Sig Dispense Refill   aspirin EC 81 MG tablet Take 81 mg by mouth daily. Swallow whole.     atorvastatin (LIPITOR) 80 MG tablet Take 1 tablet (80 mg total) by mouth every evening. 90 tablet 3   furosemide (LASIX) 40 MG tablet Take 1 tablet (40 mg total) by mouth 2 (two) times daily. 180 tablet 3   insulin NPH-regular Human (70-30) 100 UNIT/ML injection Inject 100 Units into the skin 2 (two) times daily.     isosorbide mononitrate (IMDUR) 30 MG 24 hr tablet Take 0.5 tablets (15 mg total) by mouth daily. 30 tablet 2   metoprolol succinate (TOPROL-XL) 25 MG 24 hr tablet Take 1 tablet (25 mg total) by mouth 2 (two) times daily. 30 tablet 1   nitroGLYCERIN (NITROSTAT) 0.4 MG SL  tablet Place 1 tablet (0.4 mg total) under the tongue every 5 (five) minutes x 3 doses as needed for chest pain. 30 tablet 1   omeprazole (PRILOSEC) 20 MG capsule Take 2 capsules by mouth once daily 60 capsule 0   ranolazine (RANEXA) 500 MG 12 hr tablet Take 1 tablet by mouth twice daily 180 tablet 3   tamsulosin (FLOMAX) 0.4 MG CAPS capsule Take 0.4 mg by mouth daily.     No current facility-administered medications for this visit.     Physical Exam    VS:  BP 126/74 (BP Location: Left Arm, Patient Position: Sitting, Cuff Size: Large)   Pulse 70   Ht 5\' 9"  (1.753 m)   Wt 255 lb (115.7 kg)   SpO2 98%   BMI 37.66 kg/m  , BMI Body mass index is 37.66 kg/m.       GEN: Obese, in no acute distress. HEENT: normal. Neck: Supple, obese, difficult to gauge JVP.  No bruits or masses.   Cardiac: RRR, 2/6 systolic murmur at the upper sternal borders and left lower sternal border.  No rubs or gallops.  No clubbing, cyanosis, edema.  Radials 2+/PT 2+ and equal bilaterally.  Respiratory:  Respirations regular and unlabored, clear to auscultation bilaterally. GI: Obese, protuberant, soft, nontender, BS + x 4. MS: no deformity or atrophy. Skin: warm and dry, no rash. Neuro:  Strength and sensation are intact. Psych: Normal affect.  Accessory Clinical Findings    ECG personally reviewed by me today - EKG Interpretation Date/Time:  Wednesday June 24 2023 10:22:56 EST Ventricular Rate:  70 PR Interval:  182 QRS Duration:  88 QT Interval:  428 QTC Calculation: 462 R Axis:   -25  Text Interpretation: Normal sinus rhythm Left ventricular hypertrophy with repolarization abnormality ( R in aVL , Cornell product ) Anteroseptal infarct , age undetermined Confirmed by Nicolasa Ducking [phone]) on 06/24/2023 10:41:21 AM  - no acute changes.  Lab Results  Component Value Date   WBC 10.5 06/18/2023   HGB 12.1 (L) 06/18/2023   HCT 37.9 (L) 06/18/2023   MCV 111.8 (H) 06/18/2023   PLT 123 (L)  06/18/2023   Lab Results  Component Value Date   CREATININE 1.29 (H) 06/01/2023   BUN 18 06/01/2023   NA 133 (L) 06/01/2023   K 4.2 06/01/2023   CL 100 06/01/2023   CO2 23 06/01/2023   Lab Results  Component Value Date   ALT 23 06/01/2023   AST 27 06/01/2023   ALKPHOS 59 06/01/2023   BILITOT 1.2 (H) 06/01/2023   Lab Results  Component Value Date   CHOL 128 06/02/2023   HDL 16 (L) 06/02/2023   LDLCALC 65 06/02/2023   TRIG 234 (H) 06/02/2023   CHOLHDL 8.0 06/02/2023    Lab Results  Component Value Date   HGBA1C 8.1 (H) 12/08/2022   Lab Results  Component Value Date   TSH 3.610 10/25/2021       Assessment & Plan    1.  Precordial chest pain/CAD: Status post CABG x 2 in July 2017.  He has had chronic and intermittent chest pain ever since.  Diagnostic catheterization in December 2023 with patent LIMA to the LAD and vein graft to the second obtuse marginal, and otherwise moderate, diffuse CAD.  Recent hospitalization in November for chest pain notable for normal troponins.  He has been doing relatively well since then with just a few episodes of chest pain including this morning.  All episodes similar to his experiences since 2017.  Overall, feeling better since starting iron infusions through the cancer center, with better energy, breathing, and reduced frequency of chest pain.  Continue medical therapy including aspirin, statin, beta-blocker, nitrate, and Ranexa.  2.  Paroxysmal atrial fibrillation: Maintaining sinus rhythm on beta-blocker.  He is on aspirin only at this time in the setting of rectal bleeding earlier this fall necessitating discontinuation of Xarelto.  3.  Chronic HFpEF: EF 60-65% by echo May 2024.  Euvolemic on examination.  Heart rate and blood pressure stable.  Continue current dose of furosemide.  4.  Status post AVR: Previously complicated by SBE.  Normal functioning bioprosthetic arctic valve on echo earlier this year.  5.  Type 2 diabetes mellitus:  A1c 7.4 in August.  He is followed by primary care and remains on insulin therapy.  6.  Primary hypertension: Blood pressure stable at 126/74 today.  Continue beta-blocker and nitrate therapy.  7.  MDS/anemia: H&H 12.1 and 37.9 in December 5.  In that setting, he has been feeling much better.  8.  Rectal bleeding: As noted patient previously complained of rectal bleeding on Xarelto therapy which is on hold.  Notes indicate that has been referred to gastroenterology though he does not currently have an appointment.  Denies any recent rectal bleeding.  9.  Disposition: Follow-up in 3 months or sooner if necessary.    Nicolasa Ducking, NP 06/24/2023, 11:02 AM

## 2023-06-24 NOTE — Patient Instructions (Signed)
Medication Instructions:  No changes *If you need a refill on your cardiac medications before your next appointment, please call your pharmacy*   Lab Work: None ordered If you have labs (blood work) drawn today and your tests are completely normal, you will receive your results only by: MyChart Message (if you have MyChart) OR A paper copy in the mail If you have any lab test that is abnormal or we need to change your treatment, we will call you to review the results.   Testing/Procedures: None ordered   Follow-Up: At [facility], you and your health needs are our priority.  As part of our continuing mission to provide you with exceptional heart care, we have created designated Provider Care Teams.  These Care Teams include your primary Cardiologist (physician) and Advanced Practice Providers (APPs -  Physician Assistants and Nurse Practitioners) who all work together to provide you with the care you need, when you need it.  We recommend signing up for the patient portal called "MyChart".  Sign up information is provided on this After Visit Summary.  MyChart is used to connect with patients for Virtual Visits (Telemedicine).  Patients are able to view lab/test results, encounter notes, upcoming appointments, etc.  Non-urgent messages can be sent to your provider as well.   To learn more about what you can do with MyChart, go to [email or web address].    Your next appointment:   3 month(s)  Provider:   Yvonne Kendall, MD

## 2023-07-02 ENCOUNTER — Other Ambulatory Visit: Payer: Self-pay | Admitting: Internal Medicine

## 2023-07-29 ENCOUNTER — Other Ambulatory Visit: Payer: Self-pay

## 2023-07-29 DIAGNOSIS — D469 Myelodysplastic syndrome, unspecified: Secondary | ICD-10-CM

## 2023-07-30 ENCOUNTER — Inpatient Hospital Stay: Payer: Medicare HMO | Attending: Oncology

## 2023-07-30 DIAGNOSIS — Z87891 Personal history of nicotine dependence: Secondary | ICD-10-CM | POA: Diagnosis not present

## 2023-07-30 DIAGNOSIS — K625 Hemorrhage of anus and rectum: Secondary | ICD-10-CM | POA: Insufficient documentation

## 2023-07-30 DIAGNOSIS — Z7901 Long term (current) use of anticoagulants: Secondary | ICD-10-CM | POA: Diagnosis not present

## 2023-07-30 DIAGNOSIS — D469 Myelodysplastic syndrome, unspecified: Secondary | ICD-10-CM | POA: Insufficient documentation

## 2023-07-30 DIAGNOSIS — D509 Iron deficiency anemia, unspecified: Secondary | ICD-10-CM | POA: Insufficient documentation

## 2023-07-30 DIAGNOSIS — I480 Paroxysmal atrial fibrillation: Secondary | ICD-10-CM | POA: Insufficient documentation

## 2023-07-30 LAB — CBC WITH DIFFERENTIAL (CANCER CENTER ONLY)
Abs Immature Granulocytes: 0.15 10*3/uL — ABNORMAL HIGH (ref 0.00–0.07)
Basophils Absolute: 0.1 10*3/uL (ref 0.0–0.1)
Basophils Relative: 1 %
Eosinophils Absolute: 0 10*3/uL (ref 0.0–0.5)
Eosinophils Relative: 0 %
HCT: 38 % — ABNORMAL LOW (ref 39.0–52.0)
Hemoglobin: 12.5 g/dL — ABNORMAL LOW (ref 13.0–17.0)
Immature Granulocytes: 2 %
Lymphocytes Relative: 25 %
Lymphs Abs: 2 10*3/uL (ref 0.7–4.0)
MCH: 36.7 pg — ABNORMAL HIGH (ref 26.0–34.0)
MCHC: 32.9 g/dL (ref 30.0–36.0)
MCV: 111.4 fL — ABNORMAL HIGH (ref 80.0–100.0)
Monocytes Absolute: 0.7 10*3/uL (ref 0.1–1.0)
Monocytes Relative: 9 %
Neutro Abs: 5.3 10*3/uL (ref 1.7–7.7)
Neutrophils Relative %: 63 %
Platelet Count: 129 10*3/uL — ABNORMAL LOW (ref 150–400)
RBC: 3.41 MIL/uL — ABNORMAL LOW (ref 4.22–5.81)
RDW: 17 % — ABNORMAL HIGH (ref 11.5–15.5)
WBC Count: 8.3 10*3/uL (ref 4.0–10.5)
nRBC: 0 % (ref 0.0–0.2)

## 2023-08-05 ENCOUNTER — Other Ambulatory Visit: Payer: Self-pay

## 2023-08-05 DIAGNOSIS — R5383 Other fatigue: Secondary | ICD-10-CM

## 2023-08-05 DIAGNOSIS — D469 Myelodysplastic syndrome, unspecified: Secondary | ICD-10-CM

## 2023-08-06 ENCOUNTER — Inpatient Hospital Stay (HOSPITAL_BASED_OUTPATIENT_CLINIC_OR_DEPARTMENT_OTHER): Payer: Medicare HMO | Admitting: Oncology

## 2023-08-06 ENCOUNTER — Encounter: Payer: Self-pay | Admitting: Oncology

## 2023-08-06 ENCOUNTER — Inpatient Hospital Stay: Payer: Medicare HMO

## 2023-08-06 VITALS — BP 119/69 | HR 82 | Temp 97.1°F | Resp 20 | Wt 262.0 lb

## 2023-08-06 DIAGNOSIS — D469 Myelodysplastic syndrome, unspecified: Secondary | ICD-10-CM | POA: Diagnosis not present

## 2023-08-06 DIAGNOSIS — R5383 Other fatigue: Secondary | ICD-10-CM

## 2023-08-06 DIAGNOSIS — D500 Iron deficiency anemia secondary to blood loss (chronic): Secondary | ICD-10-CM | POA: Diagnosis not present

## 2023-08-06 DIAGNOSIS — K625 Hemorrhage of anus and rectum: Secondary | ICD-10-CM | POA: Diagnosis not present

## 2023-08-06 DIAGNOSIS — D509 Iron deficiency anemia, unspecified: Secondary | ICD-10-CM | POA: Insufficient documentation

## 2023-08-06 LAB — IRON AND TIBC
Iron: 124 ug/dL (ref 45–182)
Saturation Ratios: 43 % — ABNORMAL HIGH (ref 17.9–39.5)
TIBC: 290 ug/dL (ref 250–450)
UIBC: 166 ug/dL

## 2023-08-06 LAB — FERRITIN: Ferritin: 141 ng/mL (ref 24–336)

## 2023-08-06 NOTE — Assessment & Plan Note (Addendum)
He is on Aspirin 81mg  and recently started on Xarelto for A fib. off anticoagulation after discussion with cardiology He declined GI for further evaluation.

## 2023-08-06 NOTE — Progress Notes (Signed)
Patient is complaining of feet hurting really bad & feels like he's had pressure in his chest. His daughter also states he's constantly experiencing shortness of breath.

## 2023-08-06 NOTE — Progress Notes (Signed)
Hematology/Oncology Progress note [phone] [phone]       ASSESSMENT & PLAN:   Myelodysplastic syndrome (HCC) ZRSR2 mutation.MDS-IB-1 [facility] Dr. Zenaida Niece Deventer's notes were reviewed.  I agree with initiating EPO therapy if his hemoglobin is <=8 or <=9 with evidence of myocardium ischemia. His Hb has improved after IV venofer treatment  No need for retacrit.   Rectal bleeding He is on Aspirin 81mg  and recently started on Xarelto for A fib. off anticoagulation after discussion with cardiology He declined GI for further evaluation.   Iron deficiency anemia Labs are reviewed and discussed with patient. Lab Results  Component Value Date   HGB 12.5 (L) 07/30/2023   TIBC 290 08/06/2023   IRONPCTSAT 43 (H) 08/06/2023   FERRITIN 141 08/06/2023    Hemoglobin has responded very well after Venofer treatments.  Hold off venofer today.    Orders Placed This Encounter  Procedures   CBC with Differential (Cancer Center Only)    Standing Status:   Future    Expected Date:   11/04/2023    Expiration Date:   08/05/2024   Iron and TIBC    Standing Status:   Future    Expected Date:   11/04/2023    Expiration Date:   08/05/2024   Ferritin    Standing Status:   Future    Expected Date:   11/04/2023    Expiration Date:   08/05/2024   Follow up   3 months lab prior to MD +/- Venofer   All questions were answered. The patient knows to call the clinic with any problems, questions or concerns.  [name], MD, PhD [facility] Hematology Oncology 08/06/2023     CHIEF COMPLAINTS/PURPOSE OF CONSULTATION:  MDS  HISTORY OF PRESENTING ILLNESS:  Jonathan Cross. 78 y.o. male presents to establish care for MDS I have reviewed his chart and materials related to his cancer extensively and collaborated history with the patient. Summary of oncologic history is as follows: Oncology History  Myelodysplastic syndrome (HCC)  05/21/2022 Bone Marrow Biopsy   Bone marrow  biopsy showed  - - Hypercellular bone marrow (70%) with trilineage hematopoiesis, megakaryocytic dyspoiesis, and 6% blasts by manual aspirate differential (see Comment)  The current findings of pancytopenia, hypercellular bone marrow with megakaryocyte dyspoiesis (>10%) and increased blasts (6%) in addition to the presence of a clonal molecular abnormality (ZRSRS mutation with VAF 50.7) are consistent with myelodysplastic neoplasm with increased blasts (MDS-IB-1) in the absence of MDS or AML defining genetic abnormality.    06/12/2022 Initial Diagnosis   Myelodysplastic syndrome [facility])  Patient follows up with [facility] Dr. Leotis Pain.  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 See above.  Using the IPSS-M, he has low risk disease with a median leukemia free survival of about 6 years.  There is concern of MDS may add to his possible angina.  Dr. Leotis Pain recommends treatment  1) IF he has evidence of cardiac ischemia that has been maximally medically managed AND a hemoglobin < 9 g/dL 2 ) If his hemoglobin was < 8.0   Darbepoietin/GCSF per Darcel Bayley et al Dewayne Hatch Hem 2013 May; 92(5) Lucile Crater 2013 May; 92(5) [email or web address]://[email or web address])  Ferritin: Treat with iron if ferritin is < 50 Treatment  Darbepoetin 500 mcg every 2 weeks CR = Hgb > 11.5 PR = Inc in Hgb of 1.5 If no response in 12 weeks, add  G-CSF 300 mcg two times a week to maintain WBC counts between 5 and 10  If no response at 24 weeks, discontinue trial If hemoglobin is > 12, hold darbepoetin until hemoglobin is < 11. Resume darbepoetin at every 3 weeks. Continue to increase interval by 1 week if hemoglobin increases to > 12 again.      His hemoglobin was 8 on 05/01/22, and he received one unit of PRBC transfusion.  He is accompanied by his daughter today.  +  chronic fatigue, intermittent chest pain, stable, not worse.    INTERVAL HISTORY Jonathan Cross. is a 78 y.o. male who has above history reviewed by me today presents for follow up visit for MDS, anemia.   - Fatigue, has improved.  Off anticoagulation.  intermittent rectal bleeding, bright red color.     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 04/2022 Echo: EF 60-65%, GrII DD; b. 06/2022 RHC: PA 50/20(30), PCWP 25; c. 11/2022 Echo: EF 60-65%, mild LVH, GrI DD. mildly reduced RV fxn, mild MR, nl fxn'ing bioprosthetic AoV; d. 01/2023 Echo: EF 60-65%, nl fxn'ing AoV, prob mild AS. Nl RV fxn.   Coronary artery disease    a. 01/2016 CABG x 2: LIMA->LAD, VG->OM; b. 05/2021 MV: small, mild, rev apical lateral and apical inf defects ->subtle ischemia vs artifact, EF 55-65%-->low risk; c. 06/2022 Cath: LM 20d, LAD 80ost, 60p/m, 20m, D1 mild dzs, LCX 50ost/p, OM1 mod dzs, RCA mild diff dzs, RPL1 75, VG->OM2 & LIMA->LAD patent--> Med Rx.   CVA, old, disturbances of vision    Diabetes (HCC)    H/O aortic valve replacement    a. 01/2016 s/p bioprosthetic AVR @ [facility]; b. 05/2021 Echo: EF 55-60%, nl fxn of bioprosthetic AVR; c. 04/2022 Echo: EF 60-65%, nl fxn'ing AoV; d. 11/2022 Echo: nl fxn'ing AoV; e. 01/2023 Echo: nl fxn'ing Aov, mean grad .   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 CHA2DS2VASc = 5-->eliquis/amio; b. 01/2023 Amio d/c'd due to concern re: lung toxicity/ongoing dyspnea.   Retinal vascular occlusion of left eye    Several years ago   Sleep apnea    BiPAP   Vertigo    Wears dentures    full upper and lower.  Lower "broken"    SURGICAL HISTORY: Past  Surgical History:  Procedure Laterality Date   AORTIC VALVE REPLACEMENT  2017   [facility], bioprosthetic   APPENDECTOMY     BONE MARROW BIOPSY     05/2022   CARDIAC VALVE REPLACEMENT     CATARACT EXTRACTION W/PHACO Right 11/12/2022   Procedure: CATARACT EXTRACTION PHACO AND INTRAOCULAR LENS PLACEMENT (IOC) RIGHT DIABETIC MALYUGIN  13.49  01:06.1;  Surgeon: Lockie Mola, MD;  Location: [facility];  Service: Ophthalmology;  Laterality: Right;  Diabetic   CORONARY ARTERY BYPASS GRAFT  2017   [facility] - LIMA-LAD and SVG-OM   CYSTOSCOPY W/ RETROGRADES Right 05/27/2019   Procedure: CYSTOSCOPY WITH RETROGRADE PYELOGRAM;  Surgeon: Sondra Come, MD;  Location: [facility] ORS;  Service: Urology;  Laterality: Right;   CYSTOSCOPY W/ RETROGRADES Right 07/11/2019   Procedure: CYSTOSCOPY WITH RETROGRADE PYELOGRAM;  Surgeon: Legrand Rams  C, MD;  Location: [facility] ORS;  Service: Urology;  Laterality: Right;   CYSTOSCOPY/URETEROSCOPY/HOLMIUM LASER/STENT PLACEMENT Right 05/27/2019   Procedure: CYSTOSCOPY/URETEROSCOPY/HOLMIUM LASER/STENT PLACEMENT;  Surgeon: Sondra Come, MD;  Location: [facility] OR[facility];  Service: Urology;  Laterality: Right;   CYSTOSCOPY/URETEROSCOPY/HOLMIUM LASER/STENT PLACEMENT Right 06/17/2019   Procedure: CYSTOSCOPY/URETEROSCOPY/HOLMIUM LASER/STENT Exchange;  Surgeon: Sondra Come, MD;  Location: [facility];  Service: Urology;  Laterality: Right;   CYSTOSCOPY/URETEROSCOPY/HOLMIUM LASER/STENT PLACEMENT Right 07/11/2019   Procedure: CYSTOSCOPY/URETEROSCOPY/HOLMIUM LASER/STENT Exchange;  Surgeon: Sondra Come, MD;  Location: [facility] OR[facility];  Service: Urology;  Laterality: Right;   RIGHT HEART CATH AND CORONARY/GRAFT ANGIOGRAPHY Bilateral 06/17/2022   Procedure: RIGHT HEART CATH AND CORONARY/GRAFT ANGIOGRAPHY;  Surgeon: Yvonne Kendall, MD;  Location: [facility];  Service: Cardiovascular;  Laterality: Bilateral;   STONE EXTRACTION WITH BASKET Right 07/11/2019   Procedure: STONE  EXTRACTION WITH BASKET;  Surgeon: Sondra Come, MD;  Location: [facility];  Service: Urology;  Laterality: Right;   TEE WITHOUT CARDIOVERSION N/A 04/21/2019   Procedure: TRANSESOPHAGEAL ECHOCARDIOGRAM (TEE);  Surgeon: Antonieta Iba, MD;  Location: [facility];  Service: Cardiovascular;  Laterality: N/A;   TEE WITHOUT CARDIOVERSION N/A 07/04/2019   Procedure: TRANSESOPHAGEAL ECHOCARDIOGRAM (TEE);  Surgeon: Iran Ouch, MD;  Location: [facility];  Service: Cardiovascular;  Laterality: N/A;   TEE WITHOUT CARDIOVERSION N/A 08/17/2019   Procedure: TRANSESOPHAGEAL ECHOCARDIOGRAM (TEE);  Surgeon: Debbe Odea, MD;  Location: [facility];  Service: Cardiovascular;  Laterality: N/A;   TEE WITHOUT CARDIOVERSION N/A 11/15/2019   Procedure: TRANSESOPHAGEAL ECHOCARDIOGRAM (TEE);  Surgeon: Yvonne Kendall, MD;  Location: [facility];  Service: Cardiovascular;  Laterality: N/A;   TONSILLECTOMY      SOCIAL HISTORY: Social History   Socioeconomic History   Marital status: Widowed    Spouse name: Not on file   Number of children: Not on file   Years of education: Not on file   Highest education level: Not on file  Occupational History   Not on file  Tobacco Use   Smoking status: Former    Current packs/day: 0.00    Types: Cigarettes    Quit date: 1970    Years since quitting: 55.0   Smokeless tobacco: Never  Vaping Use   Vaping status: Never Used  Substance and Sexual Activity   Alcohol use: Never   Drug use: Never   Sexual activity: Not Currently  Other Topics Concern   Not on file  Social History Narrative   Not on file   Social Drivers of Health   Financial Resource Strain: Medium Risk (02/25/2023)   Received from [facility]   Overall Financial Resource Strain (CARDIA)    Difficulty of Paying Living Expenses: Somewhat hard  Food Insecurity: Food Insecurity Present (06/03/2023)   [facility]   Hunger Vital Sign    Worried About Running Out of Food in  the Last Year: Sometimes true    Ran Out of Food in the Last Year: Sometimes true  Transportation Needs: No Transportation Needs (06/01/2023)   PRAPARE - Administrator, Civil Service (Medical): No    Lack of Transportation (Non-Medical): No  Physical Activity: Inactive (04/24/2021)   Received from [facility]   Exercise Vital Sign    Days of Exercise per Week: 0 days    Minutes of Exercise per Session: 0 min  Stress: No Stress Concern Present (04/24/2021)   [facility] from [facility]  [employer] of Occupational Health - Occupational Stress Questionnaire    Feeling of Stress : Not at all  Social Connections: Not on file  Intimate Partner Violence: Not At Risk (06/01/2023)   Humiliation, Afraid, Rape, and Kick questionnaire    Fear of Current or Ex-Partner: No    Emotionally Abused: No    Physically Abused: No    Sexually Abused: No    FAMILY HISTORY: Family History  Problem Relation Age of Onset   Heart attack Father 14   Heart attack Sister     ALLERGIES:  is allergic to metformin.  MEDICATIONS:  Current Outpatient Medications  Medication Sig Dispense Refill   aspirin EC 81 MG tablet Take 81 mg by mouth daily. Swallow whole.     atorvastatin (LIPITOR) 80 MG tablet Take 1 tablet (80 mg total) by mouth every evening. 90 tablet 3   furosemide (LASIX) 40 MG tablet Take 1 tablet (40 mg total) by mouth 2 (two) times daily. 180 tablet 3   insulin NPH-regular Human (70-30) 100 UNIT/ML injection Inject 100 Units into the skin 2 (two) times daily.     isosorbide mononitrate (IMDUR) 30 MG 24 hr tablet Take 0.5 tablets (15 mg total) by mouth daily. 30 tablet 2   metoprolol succinate (TOPROL-XL) 25 MG 24 hr tablet Take 1 tablet (25 mg total) by mouth 2 (two) times daily. 30 tablet 1   nitroGLYCERIN (NITROSTAT) 0.4 MG SL tablet Place 1 tablet (0.4 mg total) under the tongue every 5 (five) minutes x 3 doses as needed for chest  pain. 30 tablet 1   omeprazole (PRILOSEC) 20 MG capsule Take 2 capsules by mouth once daily 60 capsule 3   predniSONE (DELTASONE) 20 MG tablet Take 20 mg by mouth daily.     ranolazine (RANEXA) 500 MG 12 hr tablet Take 1 tablet by mouth twice daily 180 tablet 3   tamsulosin (FLOMAX) 0.4 MG CAPS capsule Take 0.4 mg by mouth daily.     No current facility-administered medications for this visit.    Review of Systems  Constitutional:  Positive for fatigue. Negative for appetite change, chills and fever.  HENT:   Negative for hearing loss and voice change.   Eyes:  Negative for eye problems and icterus.  Respiratory:  Positive for chest tightness. Negative for cough and shortness of breath.   Cardiovascular:  Negative for chest pain and leg swelling.  Gastrointestinal:  Positive for blood in stool. Negative for abdominal distention and abdominal pain.  Endocrine: Negative for hot flashes.  Genitourinary:  Negative for difficulty urinating, dysuria and frequency.   Musculoskeletal:  Negative for arthralgias.  Skin:  Negative for itching and rash.  Neurological:  Negative for light-headedness and numbness.  Hematological:  Negative for adenopathy. Does not bruise/bleed easily.  Psychiatric/Behavioral:  Negative for confusion.      PHYSICAL EXAMINATION:  Vitals:   08/06/23 1121  BP: 119/69  Pulse: 82  Resp: 20  Temp: (!) 97.1 F (36.2 C)  SpO2: 96%   Filed Weights   08/06/23 1121  Weight: 262 lb (118.8 kg)    Physical Exam Constitutional:      General: He is not in acute distress.    Appearance: He is obese. He is not diaphoretic.  HENT:     Head: Normocephalic and atraumatic.  Eyes:     General: No scleral icterus. Cardiovascular:     Rate and Rhythm: Normal rate.  Pulmonary:     Effort: Pulmonary effort is normal.  No respiratory distress.  Abdominal:     General: Bowel sounds are normal. There is no distension.     Palpations: Abdomen is soft.  Musculoskeletal:         General: Normal range of motion.     Cervical back: Normal range of motion.  Skin:    General: Skin is warm and dry.     Findings: No erythema.  Neurological:     Mental Status: He is alert and oriented to person, place, and time. Mental status is at baseline.     Cranial Nerves: No cranial nerve deficit.     Motor: No abnormal muscle tone.  Psychiatric:        Mood and Affect: Affect normal.      LABORATORY DATA:  I have reviewed the data as listed    Latest Ref Rng & Units 07/30/2023   10:47 AM 06/18/2023   10:51 AM 06/01/2023    4:26 AM  CBC  WBC 4.0 - 10.5 K/uL 8.3  10.5  6.4   Hemoglobin 13.0 - 17.0 g/dL 16.1  09.6  8.5   Hematocrit 39.0 - 52.0 % 38.0  37.9  27.0   Platelets 150 - 400 K/uL 129  123  176       Latest Ref Rng & Units 06/01/2023    4:26 AM 05/14/2023   10:30 AM 03/04/2023    3:30 PM  CMP  Glucose 70 - 99 mg/dL 045  409  811   BUN 8 - 23 mg/dL 18  17  16    Creatinine 0.61 - 1.24 mg/dL 9.14  7.82  9.56   Sodium 135 - 145 mmol/L 133  132  131   Potassium 3.5 - 5.1 mmol/L 4.2  3.6  4.6   Chloride 98 - 111 mmol/L 100  100  103   CO2 22 - 32 mmol/L 23  24  22    Calcium 8.9 - 10.3 mg/dL 8.8  8.3  8.2   Total Protein 6.5 - 8.1 g/dL 8.0  8.2    Total Bilirubin <1.2 mg/dL 1.2  0.6    Alkaline Phos 38 - 126 U/L 59  57    AST 15 - 41 U/L 27  21    ALT 0 - 44 U/L 23  20       RADIOGRAPHIC STUDIES: I have personally reviewed the radiological images as listed and agreed with the findings in the report. No results found.

## 2023-08-06 NOTE — Assessment & Plan Note (Addendum)
ZRSR2 mutation.MDS-IB-1 [facility] Dr. Zenaida Niece Deventer's notes were reviewed.  I agree with initiating EPO therapy if his hemoglobin is <=8 or <=9 with evidence of myocardium ischemia. His Hb has improved after IV venofer treatment  No need for retacrit.

## 2023-08-06 NOTE — Assessment & Plan Note (Signed)
Labs are reviewed and discussed with patient. Lab Results  Component Value Date   HGB 12.5 (L) 07/30/2023   TIBC 290 08/06/2023   IRONPCTSAT 43 (H) 08/06/2023   FERRITIN 141 08/06/2023    Hemoglobin has responded very well after Venofer treatments.  Hold off venofer today.

## 2023-08-10 NOTE — Telephone Encounter (Signed)
error

## 2023-08-24 ENCOUNTER — Emergency Department: Payer: Medicare HMO

## 2023-08-24 ENCOUNTER — Emergency Department
Admission: EM | Admit: 2023-08-24 | Discharge: 2023-08-24 | Discharge status: Against Medical Advice | Payer: Self-pay | Source: Home / Self Care

## 2023-08-24 ENCOUNTER — Other Ambulatory Visit: Payer: Self-pay

## 2023-08-24 ENCOUNTER — Observation Stay
Admission: EM | Admit: 2023-08-24 | Discharge: 2023-08-25 | Disposition: A | Discharge status: Home / Self Care | Payer: Medicare HMO | Attending: Internal Medicine | Admitting: Internal Medicine

## 2023-08-24 DIAGNOSIS — R2689 Other abnormalities of gait and mobility: Secondary | ICD-10-CM | POA: Diagnosis not present

## 2023-08-24 DIAGNOSIS — Z952 Presence of prosthetic heart valve: Secondary | ICD-10-CM | POA: Insufficient documentation

## 2023-08-24 DIAGNOSIS — I2081 Angina pectoris with coronary microvascular dysfunction: Secondary | ICD-10-CM | POA: Diagnosis not present

## 2023-08-24 DIAGNOSIS — Z79899 Other long term (current) drug therapy: Secondary | ICD-10-CM | POA: Diagnosis not present

## 2023-08-24 DIAGNOSIS — R072 Precordial pain: Principal | ICD-10-CM

## 2023-08-24 DIAGNOSIS — R0789 Other chest pain: Secondary | ICD-10-CM | POA: Diagnosis present

## 2023-08-24 DIAGNOSIS — Z7982 Long term (current) use of aspirin: Secondary | ICD-10-CM | POA: Insufficient documentation

## 2023-08-24 DIAGNOSIS — K625 Hemorrhage of anus and rectum: Secondary | ICD-10-CM | POA: Diagnosis not present

## 2023-08-24 DIAGNOSIS — I480 Paroxysmal atrial fibrillation: Secondary | ICD-10-CM | POA: Diagnosis not present

## 2023-08-24 DIAGNOSIS — R7989 Other specified abnormal findings of blood chemistry: Secondary | ICD-10-CM | POA: Diagnosis not present

## 2023-08-24 DIAGNOSIS — I5032 Chronic diastolic (congestive) heart failure: Secondary | ICD-10-CM | POA: Diagnosis not present

## 2023-08-24 DIAGNOSIS — R079 Chest pain, unspecified: Secondary | ICD-10-CM | POA: Diagnosis present

## 2023-08-24 DIAGNOSIS — Z951 Presence of aortocoronary bypass graft: Secondary | ICD-10-CM | POA: Diagnosis not present

## 2023-08-24 DIAGNOSIS — E119 Type 2 diabetes mellitus without complications: Secondary | ICD-10-CM | POA: Insufficient documentation

## 2023-08-24 DIAGNOSIS — Z87891 Personal history of nicotine dependence: Secondary | ICD-10-CM | POA: Insufficient documentation

## 2023-08-24 DIAGNOSIS — I110 Hypertensive heart disease with heart failure: Secondary | ICD-10-CM | POA: Insufficient documentation

## 2023-08-24 DIAGNOSIS — Z794 Long term (current) use of insulin: Secondary | ICD-10-CM | POA: Diagnosis not present

## 2023-08-24 DIAGNOSIS — Z8673 Personal history of transient ischemic attack (TIA), and cerebral infarction without residual deficits: Secondary | ICD-10-CM | POA: Insufficient documentation

## 2023-08-24 DIAGNOSIS — I2089 Other forms of angina pectoris: Secondary | ICD-10-CM | POA: Diagnosis present

## 2023-08-24 DIAGNOSIS — I25118 Atherosclerotic heart disease of native coronary artery with other forms of angina pectoris: Secondary | ICD-10-CM | POA: Insufficient documentation

## 2023-08-24 LAB — HEMOGLOBIN A1C
Hgb A1c MFr Bld: 9.2 % — ABNORMAL HIGH (ref 4.8–5.6)
Mean Plasma Glucose: 217.34 mg/dL

## 2023-08-24 LAB — COMPREHENSIVE METABOLIC PANEL
ALT: 33 U/L (ref 0–44)
AST: 20 U/L (ref 15–41)
Albumin: 3.6 g/dL (ref 3.5–5.0)
Alkaline Phosphatase: 47 U/L (ref 38–126)
Anion gap: 13 (ref 5–15)
BUN: 23 mg/dL (ref 8–23)
CO2: 27 mmol/L (ref 22–32)
Calcium: 9 mg/dL (ref 8.9–10.3)
Chloride: 98 mmol/L (ref 98–111)
Creatinine, Ser: 0.99 mg/dL (ref 0.61–1.24)
GFR, Estimated: >60 mL/min (ref >60)
Glucose, Bld: 178 mg/dL — ABNORMAL HIGH (ref 70–99)
Potassium: 3.2 mmol/L — ABNORMAL LOW (ref 3.5–5.1)
Sodium: 138 mmol/L (ref 135–145)
Total Bilirubin: 0.8 mg/dL (ref 0.0–1.2)
Total Protein: 6.9 g/dL (ref 6.5–8.1)

## 2023-08-24 LAB — CBC WITH DIFFERENTIAL/PLATELET
Abs Immature Granulocytes: 0.18 10*3/uL — ABNORMAL HIGH (ref 0.00–0.07)
Basophils Absolute: 0 10*3/uL (ref 0.0–0.1)
Basophils Relative: 1 %
Eosinophils Absolute: 0 10*3/uL (ref 0.0–0.5)
Eosinophils Relative: 0 %
HCT: 38.1 % — ABNORMAL LOW (ref 39.0–52.0)
Hemoglobin: 12.7 g/dL — ABNORMAL LOW (ref 13.0–17.0)
Immature Granulocytes: 2 %
Lymphocytes Relative: 30 %
Lymphs Abs: 2.6 10*3/uL (ref 0.7–4.0)
MCH: 38 pg — ABNORMAL HIGH (ref 26.0–34.0)
MCHC: 33.3 g/dL (ref 30.0–36.0)
MCV: 114.1 fL — ABNORMAL HIGH (ref 80.0–100.0)
Monocytes Absolute: 0.9 10*3/uL (ref 0.1–1.0)
Monocytes Relative: 11 %
Neutro Abs: 4.9 10*3/uL (ref 1.7–7.7)
Neutrophils Relative %: 56 %
Platelets: 104 10*3/uL — ABNORMAL LOW (ref 150–400)
RBC: 3.34 MIL/uL — ABNORMAL LOW (ref 4.22–5.81)
RDW: 15.6 % — ABNORMAL HIGH (ref 11.5–15.5)
Smear Review: NORMAL
WBC Morphology: INCREASED
WBC: 8.7 10*3/uL (ref 4.0–10.5)
nRBC: 0.2 % (ref 0.0–0.2)

## 2023-08-24 LAB — URINALYSIS, COMPLETE (UACMP) WITH MICROSCOPIC
Bacteria, UA: NONE SEEN
Bilirubin Urine: NEGATIVE
Glucose, UA: 150 mg/dL — AB
Hgb urine dipstick: NEGATIVE
Ketones, ur: NEGATIVE mg/dL
Leukocytes,Ua: NEGATIVE
Nitrite: NEGATIVE
Protein, ur: 30 mg/dL — AB
Specific Gravity, Urine: 1.02 (ref 1.005–1.030)
pH: 5 (ref 5.0–8.0)

## 2023-08-24 LAB — TROPONIN I (HIGH SENSITIVITY)
Troponin I (High Sensitivity): 52 ng/L — ABNORMAL HIGH (ref <18)
Troponin I (High Sensitivity): 61 ng/L — ABNORMAL HIGH (ref <18)
Troponin I (High Sensitivity): 49 ng/L — ABNORMAL HIGH (ref <18)

## 2023-08-24 LAB — LIPASE, BLOOD: Lipase: 54 U/L — ABNORMAL HIGH (ref 11–51)

## 2023-08-24 LAB — CBG MONITORING, ED
Glucose-Capillary: 180 mg/dL — ABNORMAL HIGH (ref 70–99)
Glucose-Capillary: 265 mg/dL — ABNORMAL HIGH (ref 70–99)

## 2023-08-24 MED ORDER — TAMSULOSIN HCL 0.4 MG PO CAPS
0.4000 mg | ORAL_CAPSULE | Freq: Every day | ORAL | Status: DC
  Administered 2023-08-24 – 2023-08-25 (×2): 0.4 mg via ORAL
  Filled 2023-08-24 (×2): qty 1

## 2023-08-24 MED ORDER — HYDRALAZINE HCL 20 MG/ML IJ SOLN
10.0000 mg | Freq: Once | INTRAMUSCULAR | Status: DC
  Filled 2023-08-24: qty 1

## 2023-08-24 MED ORDER — POTASSIUM CHLORIDE CRYS ER 20 MEQ PO TBCR
40.0000 meq | EXTENDED_RELEASE_TABLET | ORAL | Status: AC
  Administered 2023-08-24 (×2): 40 meq via ORAL
  Filled 2023-08-24 (×2): qty 2

## 2023-08-24 MED ORDER — ONDANSETRON HCL 4 MG/2ML IJ SOLN: 4.0000 mg | Freq: Four times a day (QID) | INTRAMUSCULAR | Status: DC | PRN

## 2023-08-24 MED ORDER — METOPROLOL TARTRATE 25 MG PO TABS
25.0000 mg | ORAL_TABLET | Freq: Two times a day (BID) | ORAL | Status: DC
  Administered 2023-08-24 – 2023-08-25 (×2): 25 mg via ORAL
  Filled 2023-08-24 (×2): qty 1

## 2023-08-24 MED ORDER — INSULIN ASPART 100 UNIT/ML IJ SOLN
0.0000 [IU] | Freq: Every day | INTRAMUSCULAR | Status: DC
  Administered 2023-08-24: 3 [IU] via SUBCUTANEOUS
  Filled 2023-08-24: qty 1

## 2023-08-24 MED ORDER — POTASSIUM CHLORIDE CRYS ER 20 MEQ PO TBCR: 40.0000 meq | EXTENDED_RELEASE_TABLET | Freq: Once | ORAL | Status: DC

## 2023-08-24 MED ORDER — ASPIRIN 81 MG PO TBEC
81.0000 mg | DELAYED_RELEASE_TABLET | Freq: Every day | ORAL | Status: DC
  Administered 2023-08-24 – 2023-08-25 (×2): 81 mg via ORAL
  Filled 2023-08-24 (×2): qty 1

## 2023-08-24 MED ORDER — ENOXAPARIN SODIUM 60 MG/0.6ML IJ SOSY
0.5000 mg/kg | PREFILLED_SYRINGE | INTRAMUSCULAR | Status: DC
  Administered 2023-08-24 – 2023-08-25 (×2): 60 mg via SUBCUTANEOUS
  Filled 2023-08-24 (×2): qty 0.6

## 2023-08-24 MED ORDER — ACETAMINOPHEN 325 MG PO TABS: 650.0000 mg | ORAL_TABLET | ORAL | Status: DC | PRN

## 2023-08-24 MED ORDER — FUROSEMIDE 40 MG PO TABS
40.0000 mg | ORAL_TABLET | Freq: Two times a day (BID) | ORAL | Status: DC
  Administered 2023-08-24 – 2023-08-25 (×2): 40 mg via ORAL
  Filled 2023-08-24 (×2): qty 1

## 2023-08-24 MED ORDER — ISOSORBIDE MONONITRATE ER 30 MG PO TB24
15.0000 mg | ORAL_TABLET | Freq: Every day | ORAL | Status: DC
  Administered 2023-08-24 – 2023-08-25 (×2): 15 mg via ORAL
  Filled 2023-08-24 (×2): qty 1

## 2023-08-24 MED ORDER — RANOLAZINE ER 500 MG PO TB12
500.0000 mg | ORAL_TABLET | Freq: Two times a day (BID) | ORAL | Status: DC
  Administered 2023-08-24 – 2023-08-25 (×2): 500 mg via ORAL
  Filled 2023-08-24 (×3): qty 1

## 2023-08-24 MED ORDER — INSULIN ASPART 100 UNIT/ML IJ SOLN
0.0000 [IU] | Freq: Three times a day (TID) | INTRAMUSCULAR | Status: DC
  Administered 2023-08-24: 4 [IU] via SUBCUTANEOUS
  Administered 2023-08-25 (×2): 7 [IU] via SUBCUTANEOUS
  Filled 2023-08-24 (×3): qty 1

## 2023-08-24 MED ORDER — ATORVASTATIN CALCIUM 20 MG PO TABS
80.0000 mg | ORAL_TABLET | Freq: Every evening | ORAL | Status: DC
  Administered 2023-08-24: 80 mg via ORAL
  Filled 2023-08-24: qty 4

## 2023-08-24 MED ORDER — PANTOPRAZOLE SODIUM 40 MG PO TBEC
40.0000 mg | DELAYED_RELEASE_TABLET | Freq: Every day | ORAL | Status: DC
  Administered 2023-08-24 – 2023-08-25 (×2): 40 mg via ORAL
  Filled 2023-08-24 (×2): qty 1

## 2023-08-24 MED ORDER — PREDNISONE 20 MG PO TABS
20.0000 mg | ORAL_TABLET | Freq: Every day | ORAL | Status: DC
  Administered 2023-08-24 – 2023-08-25 (×2): 20 mg via ORAL
  Filled 2023-08-24 (×2): qty 1

## 2023-08-24 MED ORDER — ENOXAPARIN SODIUM 40 MG/0.4ML IJ SOSY: 40.0000 mg | PREFILLED_SYRINGE | INTRAMUSCULAR | Status: DC

## 2023-08-24 NOTE — H&P (Signed)
 History and Physical    Jonathan Cross. ZOX:[id number] DOB: [birth year] DOA: 08/24/2023  PCP: Lucina Sabal, MD (Confirm with patient/family/NH records and if not entered, this has to be entered at [facility] point of entry) Patient coming from: Home  I have personally briefly reviewed patient's old medical records in [facility]  Chief Complaint: Chest pain  HPI: Jonathan Cross. is a 78 y.o. male with medical history significant of CAD status post CABG, severe aortic stenosis status post bioprosthetic AVR complicated by endocarditis, PAF not on Eliquis  due to severe rectal bleeding, chronic HFpEF, HTN, IIDM, MDS chronic normocytic anemia, obesity, OSA, presented with chest pains.  -This morning, patient was at rest then developed pressure-like chest pain on the left side of her chest nonradiating associated with palpitations and shortness of breath.  They felt like " must have had a heart attack" and " A-fib comes back".  He took 1 nitroglycerin  and 4 aspirin  and chest pain subsided after EMS arrived.  Patient was hospitalized November last year due to similar chest pain, nasal swab patient hemoglobin 8.5.  Subsequently patient went to see hematology at [facility], who considered patient was decompensated from MDS and worsening anemia and Neupogen was started and patient received a total of 4 doses of Epogen  infusion weekly x 4 weeks between December 2024 and January 2025.  Hematology hold off the fifth dose of Epogen  due to hemoglobin> 12 x 2.  ED Course: Afebrile, blood pressure elevated 168/85, nonhypoxic.  Chest x-ray showed no acute ST changes blood work showed K3.2, hemoglobin 12.7, creatinine 0.9.  Troponin 52> 61.  EKG showed no acute ST changes.  Review of Systems: As per HPI otherwise 14 point review of systems negative.    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  04/2022 Echo: EF 60-65%, GrII DD; b. 06/2022 RHC: PA 50/20(30), PCWP 25; c. 11/2022 Echo: EF 60-65%, mild LVH, GrI DD. mildly reduced RV fxn, mild MR, nl fxn'ing bioprosthetic AoV; d. 01/2023 Echo: EF 60-65%, nl fxn'ing AoV, prob mild AS. Nl RV fxn.   Coronary artery disease    a. 01/2016 CABG x 2: LIMA->LAD, VG->OM; b. 05/2021 MV: small, mild, rev apical lateral and apical inf defects ->subtle ischemia vs artifact, EF 55-65%-->low risk; c. 06/2022 Cath: LM 20d, LAD 80ost, 60p/m, 58m, D1 mild dzs, LCX 50ost/p, OM1 mod dzs, RCA mild diff dzs, RPL1 75, VG->OM2 & LIMA->LAD patent--> Med Rx.   CVA, old, disturbances of vision    Diabetes (HCC)    H/O aortic valve replacement    a. 01/2016 s/p bioprosthetic AVR @ [facility] for Severe AS; b. 05/2021 Echo: EF 55-60%, nl fxn of bioprosthetic AVR; c. 04/2022 Echo: EF 60-65%, nl fxn'ing AoV; d. 11/2022 Echo: nl fxn'ing AoV; e. 01/2023 Echo: nl fxn'ing Aov, mean grad .   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 CHA2DS2VASc = 5-->eliquis Jonathan Cross; b. 01/2023 Amio d/c'd due to concern re: lung toxicity/ongoing dyspnea.   Retinal vascular occlusion of left eye    Several years ago   Sleep apnea    BiPAP   Vertigo    Wears dentures    full upper and lower.  Lower "broken"    Past Surgical History:  Procedure Laterality Date   AORTIC VALVE REPLACEMENT  2017   [facility], bioprosthetic   APPENDECTOMY     BONE MARROW BIOPSY     05/2022   CARDIAC VALVE REPLACEMENT     CATARACT EXTRACTION W/PHACO Right 11/12/2022   Procedure: CATARACT EXTRACTION PHACO AND INTRAOCULAR LENS PLACEMENT (IOC) RIGHT DIABETIC MALYUGIN  13.49  01:06.1;  Surgeon: Annell Kidney, MD;  Location: [facility];  Service: Ophthalmology;  Laterality: Right;  Diabetic   CORONARY ARTERY BYPASS GRAFT  2017   [facility]  - LIMA-LAD and SVG-OM   CYSTOSCOPY W/ RETROGRADES Right 05/27/2019   Procedure: CYSTOSCOPY WITH RETROGRADE PYELOGRAM;  Surgeon: Lawerence Pressman, MD;  Location: [facility];  Service: Urology;  Laterality: Right;   CYSTOSCOPY W/ RETROGRADES Right 07/11/2019   Procedure: CYSTOSCOPY WITH RETROGRADE PYELOGRAM;  Surgeon: Lawerence Pressman, MD;  Location: [facility];  Service: Urology;  Laterality: Right;   CYSTOSCOPY/URETEROSCOPY/HOLMIUM LASER/STENT PLACEMENT Right 05/27/2019   Procedure: CYSTOSCOPY/URETEROSCOPY/HOLMIUM LASER/STENT PLACEMENT;  Surgeon: Lawerence Pressman, MD;  Location: [facility];  Service: Urology;  Laterality: Right;   CYSTOSCOPY/URETEROSCOPY/HOLMIUM LASER/STENT PLACEMENT Right 06/17/2019   Procedure: CYSTOSCOPY/URETEROSCOPY/HOLMIUM LASER/STENT Exchange;  Surgeon: Lawerence Pressman, MD;  Location: [facility];  Service: Urology;  Laterality: Right;   CYSTOSCOPY/URETEROSCOPY/HOLMIUM LASER/STENT PLACEMENT Right 07/11/2019   Procedure: CYSTOSCOPY/URETEROSCOPY/HOLMIUM LASER/STENT Exchange;  Surgeon: Lawerence Pressman, MD;  Location: [facility];  Service: Urology;  Laterality: Right;   RIGHT HEART CATH AND CORONARY/GRAFT ANGIOGRAPHY Bilateral 06/17/2022   Procedure: RIGHT HEART CATH AND CORONARY/GRAFT ANGIOGRAPHY;  Surgeon: Sammy Crisp, MD;  Location: [facility];  Service: Cardiovascular;  Laterality: Bilateral;   STONE EXTRACTION WITH BASKET Right 07/11/2019   Procedure: STONE EXTRACTION WITH BASKET;  Surgeon: Lawerence Pressman, MD;  Location: [facility];  Service: Urology;  Laterality: Right;   TEE WITHOUT CARDIOVERSION N/A 04/21/2019   Procedure: TRANSESOPHAGEAL ECHOCARDIOGRAM (TEE);  Surgeon: Devorah Fonder, MD;  Location: [facility];  Service: Cardiovascular;  Laterality: N/A;   TEE WITHOUT CARDIOVERSION N/A 07/04/2019   Procedure: TRANSESOPHAGEAL ECHOCARDIOGRAM (TEE);  Surgeon: Wenona Hamilton, MD;  Location: [facility];  Service: Cardiovascular;  Laterality: N/A;   TEE WITHOUT  CARDIOVERSION N/A 08/17/2019   Procedure: TRANSESOPHAGEAL ECHOCARDIOGRAM (TEE);  Surgeon: Constancia Delton, MD;  Location: [facility];  Service: Cardiovascular;  Laterality: N/A;   TEE WITHOUT CARDIOVERSION N/A 11/15/2019   Procedure: TRANSESOPHAGEAL ECHOCARDIOGRAM (TEE);  Surgeon: Sammy Crisp, MD;  Location: [facility];  Service: Cardiovascular;  Laterality: N/A;   TONSILLECTOMY       reports that he quit smoking about 55 years ago. His smoking use included cigarettes. He has never used smokeless tobacco. He reports that he does not drink alcohol and does not use drugs.  Allergies  Allergen Reactions   Metformin Diarrhea    Loose stools even with XR    Family History  Problem Relation Age of Onset   Heart attack Father 63   Heart attack Sister      Prior to Admission medications   Medication Sig Start Date End Date Taking? Authorizing Provider  aspirin  EC 81 MG tablet Take 81 mg by mouth daily. Swallow whole.   Yes [provider]  atorvastatin  (LIPITOR ) 80 MG tablet Take 1 tablet (80 mg total) by mouth every evening. 11/01/21  Yes Florette Hurry, NP  furosemide  (LASIX ) 40 MG tablet Take 1 tablet (40 mg total) by mouth 2 (two) times daily. 04/10/23 04/09/24 Yes Laneta Pintos  Currie Douse, NP  insulin  NPH-regular Human (70-30) 100 UNIT/ML injection Inject 100 Units into the skin 2 (two) times daily. 06/02/23  Yes Donaciano Frizzle, MD  isosorbide  mononitrate (IMDUR ) 30 MG 24 hr tablet Take 0.5 tablets (15 mg total) by mouth daily. 12/14/22  Yes Patel, Sona, MD  metoprolol  succinate (TOPROL -XL) 25 MG 24 hr tablet Take 1 tablet (25 mg total) by mouth 2 (two) times daily. 12/14/22  Yes Patel, Sona, MD  nitroGLYCERIN  (NITROSTAT ) 0.4 MG SL tablet Place 1 tablet (0.4 mg total) under the tongue every 5 (five) minutes x 3 doses as needed for chest pain. 08/12/22  Yes Tiajuana Fluke, MD  omeprazole  (PRILOSEC) 20 MG capsule Take 2 capsules by mouth once daily 07/02/23  Yes End,  Veryl Gottron, MD  predniSONE  (DELTASONE ) 20 MG tablet Take 20 mg by mouth daily.   Yes [provider]  ranolazine  (RANEXA ) 500 MG 12 hr tablet Take 1 tablet by mouth twice daily 05/05/23  Yes End, Veryl Gottron, MD  tamsulosin  (FLOMAX ) 0.4 MG CAPS capsule Take 0.4 mg by mouth daily. 03/06/21  Yes [provider]    Physical Exam: Vitals:   08/24/23 1354 08/24/23 1400 08/24/23 1430 08/24/23 1530  BP:  (!) 171/99 (!) 183/56 (!) 157/79  Pulse:  72 72 72  Resp:  13 16 15   Temp: 97.7 F (36.5 C)     TempSrc:      SpO2:   97% 99%  Weight:      Height:        Constitutional: NAD, calm, comfortable Vitals:   08/24/23 1354 08/24/23 1400 08/24/23 1430 08/24/23 1530  BP:  (!) 171/99 (!) 183/56 (!) 157/79  Pulse:  72 72 72  Resp:  13 16 15   Temp: 97.7 F (36.5 C)     TempSrc:      SpO2:   97% 99%  Weight:      Height:       Eyes: PERRL, lids and conjunctivae normal ENMT: Mucous membranes are moist. Posterior pharynx clear of any exudate or lesions.Normal dentition.  Neck: normal, supple, no masses, no thyromegaly Respiratory: clear to auscultation bilaterally, no wheezing, no crackles. Normal respiratory effort. No accessory muscle use.  Cardiovascular: Regular rate and rhythm, no murmurs / rubs / gallops. No extremity edema. 2+ pedal pulses. No carotid bruits.  Abdomen: no tenderness, no masses palpated. No hepatosplenomegaly. Bowel sounds positive.  Musculoskeletal: no clubbing / cyanosis. No joint deformity upper and lower extremities. Good ROM, no contractures. Normal muscle tone.  Skin: no rashes, lesions, ulcers. No induration Neurologic: CN 2-12 grossly intact. Sensation intact, DTR normal. Strength 5/5 in all 4.  Psychiatric: Normal judgment and insight. Alert and oriented x 3. Normal mood.     Labs on Admission: I have personally reviewed following labs and imaging studies  CBC: Recent Labs  Lab 08/24/23 0832  WBC 8.7  NEUTROABS 4.9  HGB 12.7*  HCT  38.1*  MCV 114.1*  PLT 104*   Basic Metabolic Panel: Recent Labs  Lab 08/24/23 0832  NA 138  K 3.2*  CL 98  CO2 27  GLUCOSE 178*  BUN 23  CREATININE 0.99  CALCIUM  9.0   GFR: Estimated Creatinine Clearance: 70.8 mL/min (by C-G formula based on SCr of 0.99 mg/dL). Liver Function Tests: Recent Labs  Lab 08/24/23 0832  AST 20  ALT 33  ALKPHOS 47  BILITOT 0.8  PROT 6.9  ALBUMIN 3.6   Recent Labs  Lab 08/24/23 0832  LIPASE 54*  No results for input(s): "AMMONIA" in the last 168 hours. Coagulation Profile: No results for input(s): "INR", "PROTIME" in the last 168 hours. Cardiac Enzymes: No results for input(s): "CKTOTAL", "CKMB", "CKMBINDEX", "TROPONINI" in the last 168 hours. BNP (last 3 results) No results for input(s): "PROBNP" in the last 8760 hours. HbA1C: No results for input(s): "HGBA1C" in the last 72 hours. CBG: No results for input(s): "GLUCAP" in the last 168 hours. Lipid Profile: No results for input(s): "CHOL", "HDL", "LDLCALC", "TRIG", "CHOLHDL", "LDLDIRECT" in the last 72 hours. Thyroid Function Tests: No results for input(s): "TSH", "T4TOTAL", "FREET4", "T3FREE", "THYROIDAB" in the last 72 hours. Anemia Panel: No results for input(s): "VITAMINB12", "FOLATE", "FERRITIN", "TIBC", "IRON ", "RETICCTPCT" in the last 72 hours. Urine analysis:    Component Value Date/Time   COLORURINE YELLOW (A) 06/01/2023 0426   APPEARANCEUR TURBID (A) 06/01/2023 0426   APPEARANCEUR Cloudy (A) 06/07/2019 1409   LABSPEC 1.010 06/01/2023 0426   PHURINE 5.0 06/01/2023 0426   GLUCOSEU NEGATIVE 06/01/2023 0426   HGBUR SMALL (A) 06/01/2023 0426   BILIRUBINUR NEGATIVE 06/01/2023 0426   BILIRUBINUR Negative 06/07/2019 1409   KETONESUR NEGATIVE 06/01/2023 0426   PROTEINUR 100 (A) 06/01/2023 0426   NITRITE POSITIVE (A) 06/01/2023 0426   LEUKOCYTESUR LARGE (A) 06/01/2023 0426    Radiological Exams on Admission: DG Chest Portable 1 View Result Date: 08/24/2023 CLINICAL  DATA:  Chest pain. EXAM: PORTABLE CHEST 1 VIEW COMPARISON:  Chest radiograph dated June 01, 2023. FINDINGS: The heart size and mediastinal contours are within normal limits. Prior median sternotomy and CABG. Aortic atherosclerosis. No focal consolidation, sizeable pleural effusion, or pneumothorax. No acute osseous abnormality. IMPRESSION: No acute cardiopulmonary findings. Electronically Signed   By: Mannie Seek M.D.   On: 08/24/2023 08:34    EKG: Independently reviewed.  Sinus rhythm, no acute ST changes  Assessment/Plan Principal Problem:   Chest pain Active Problems:   Angina at rest [facility])  (please populate well all problems here in Problem List. (For example, if patient is on BP meds at home and you resume or decide to hold them, it is a problem that needs to be her. Same for CAD, COPD, HLD and so on)  Chest pain Elevated troponin -Known multivessel CAD status post CABG and baseline stable angina.  Today's chest pain probably related to poorly controlled hypertension versus side effect of recent Epogen  therapy. -Discussed with on-call cardiology Dr. Nolan Battle.  Agreed with conservative management: will send the third set of troponins and echocardiogram.  If troponin trending remain flat pattern and echo does not show significant focal wall motion abnormalities and patient can be discharged home and follow-up with cardiology as as outpatient -Regarding Epogen , cardiology reviewed literature and recommend maintaining Hb not more than 11 to avoid increased risk of major cardiac cerebral event with drastic increase of Hb.  MDS -As above  HTN, uncontrolled -Clinically also suspect uncontrolled hypertension something to do with drastic increase of hemoglobin recently. -Increase metoprolol  to 25 mg twice daily -Continue Imdur , consider increased dosage if indicated -Continue Lasix    PAF -Patient reports episode of palpitations this morning, telemonitoring so far in the ED does not show  significant arrhythmia.  Plan to stay patient on telemonitoring overnight to rule out arrhythmia.  IDDM with insulin  resistance -SSI for now  DVT prophylaxis: Lovenox  Code Status: Full code Family Communication: None at bedside Disposition Plan: Expect less than 2 midnight hospital stay Consults called: Curbside consult with cardiology Admission status: Telemetry observation   [place]  MD [facility] Pager [phone]  08/24/2023, 3:49 PM

## 2023-08-24 NOTE — ED Notes (Signed)
 Called patient daughter jennifer. She is unable to pick the patient up until 4pm. She is also requesting more testing before patient is discharged.

## 2023-08-24 NOTE — ED Notes (Signed)
 CCMD called and patient setup for cardiac monitoring

## 2023-08-24 NOTE — ED Triage Notes (Signed)
 BIBEMS, c/o CP. Coming from home. Episode only last 15 mins, pain relieved when EMS arrived. SBP 170s, vss otherwise. GCS 15, BGL: 197. 12 lead showed NSR

## 2023-08-24 NOTE — ED Notes (Signed)
 This patient's daughter called and wanted an update on her father, she has called twice and would like and call back. Her name is Iran Manna and # is [phone]

## 2023-08-24 NOTE — ED Provider Notes (Addendum)
 [facility] Provider Note    Event Date/Time   First MD Initiated Contact with Patient 08/24/23 [phone]     (approximate)   History   Chief Complaint: Chest Pain   HPI  Jonathan Cross. is a 78 y.o. male with a history of hypertension diabetes obesity CAD prior CABG, who comes ED complaining of sudden onset of central chest pressure that started at about 6:45 AM this morning while walking to the bathroom.  He took 1 nitroglycerin  which helped and then for aspirin  which markedly improved the pain.  He reports that by the time EMS arrived, pain had resolved.  He reports feeling back to normal currently.  Reports being in his usual state of health recently, denies any recent illness.  No recent exertional symptoms.  EMS report initial EKG on scene shows slight ST elevation in V2 and V3 without reciprocal changes.  Looks like LVH        Physical Exam   Triage Vital Signs: ED Triage Vitals  Encounter Vitals Group     BP      Systolic BP Percentile      Diastolic BP Percentile      Pulse      Resp      Temp      Temp src      SpO2      Weight      Height      Head Circumference      Peak Flow      Pain Score      Pain Loc      Pain Education      Exclude from Growth Chart     Most recent vital signs: Vitals:   08/24/23 1300 08/24/23 1354  BP: (!) 190/97   Pulse: 73   Resp: 15   Temp:  97.7 F (36.5 C)  SpO2: 99%     General: Awake, no distress.  CV:  Good peripheral perfusion.  Regular rate rhythm.  Normal distal pulses Resp:  Normal effort.  Clear to auscultation bilaterally Abd:  No distention.  Soft nontender Other:  No lower extremity edema, no orthopnea   ED Results / Procedures / Treatments   Labs (all labs ordered are listed, but only abnormal results are displayed) Labs Reviewed  COMPREHENSIVE METABOLIC PANEL - Abnormal; Notable for the following components:      Result Value   Potassium 3.2 (*)    Glucose, Bld  178 (*)    All other components within normal limits  LIPASE, BLOOD - Abnormal; Notable for the following components:   Lipase 54 (*)    All other components within normal limits  CBC WITH DIFFERENTIAL/PLATELET - Abnormal; Notable for the following components:   RBC 3.34 (*)    Hemoglobin 12.7 (*)    HCT 38.1 (*)    MCV 114.1 (*)    MCH 38.0 (*)    RDW 15.6 (*)    Platelets 104 (*)    Abs Immature Granulocytes 0.18 (*)    All other components within normal limits  TROPONIN I (HIGH SENSITIVITY) - Abnormal; Notable for the following components:   Troponin I (High Sensitivity) 52 (*)    All other components within normal limits  TROPONIN I (HIGH SENSITIVITY) - Abnormal; Notable for the following components:   Troponin I (High Sensitivity) 61 (*)    All other components within normal limits     EKG Interpreted by me Sinus rhythm rate of  88.  Normal axis, normal intervals.  Poor R wave progression, LVH with repolarization abnormality, unchanged compared to prior EKG on June 24, 2023.  No acute ischemic changes.   RADIOLOGY Chest x-ray interpreted by me, appears unremarkable.  Radiology report reviewed   PROCEDURES:  Procedures   MEDICATIONS ORDERED IN ED: Medications - No data to display   IMPRESSION / MDM / ASSESSMENT AND PLAN / ED COURSE  I reviewed the triage vital signs and the nursing notes.  DDx: ACS, GERD, AKI, electrolyte derangement, pleural effusion  Patient's presentation is most consistent with acute presentation with potential threat to life or bodily function.  Patient presents with a single episode of central chest pressure without diaphoresis vomiting radiation or shortness of breath.  Lasted about 15 minutes, currently back to normal baseline.  Noncompliant with his medications.  Will trend troponins, check EKG and chest x-ray, monitor in ED for worsening symptoms.   ----------------------------------------- 2:02 PM on  08/24/2023 -----------------------------------------  Troponin trend is flat.  Other results are reassuring.  I patient does report that he has not been able to get up out of bed and ambulate in the room, and has had some repeat episodes of chest pain intermittently while at rest in the room.  Discussed his symptoms with his daughter who lives with him who notes that his symptoms this morning were more severe and unlike his typical angina symptoms.  With his extensive cardiac disease, uncontrolled hypertension, will plan to hospitalize for further evaluation.  Has not had his medications so far today, will give IV hydralazine  for now for initial blood pressure control.     FINAL CLINICAL IMPRESSION(S) / ED DIAGNOSES   Final diagnoses:  Stable angina pectoris (HCC)  Type 2 diabetes mellitus without complication, with long-term current use of insulin  (HCC)     Rx / DC Orders   ED Discharge Orders          Ordered    Ambulatory referral to Cardiology       Comments: If you have not heard from the Cardiology office within the next 72 hours please call [phone].   08/24/23 1359             Note:  This document was prepared using Dragon voice recognition software and may include unintentional dictation errors.   Jacquie Maudlin, MD 08/24/23 1403    Jacquie Maudlin, MD 08/24/23 [phone]

## 2023-08-24 NOTE — ED Notes (Signed)
 Per Dr. Jacquie Maudlin he spoke with patient's daughter and informed her that patient would be admitted and updated on the patient plan of care.

## 2023-08-24 NOTE — Progress Notes (Signed)
 PHARMACIST - PHYSICIAN COMMUNICATION  CONCERNING:  Enoxaparin  (Lovenox ) for DVT Prophylaxis    RECOMMENDATION: Patient was prescribed enoxaprin 40mg  q24 hours for VTE prophylaxis.   Filed Weights   08/24/23 0812  Weight: 117.9 kg (260 lb)    Body mass index is 43.27 kg/m.  Estimated Creatinine Clearance: 70.8 mL/min (by C-G formula based on SCr of 0.99 mg/dL).   Based on [facility] policy patient is candidate for enoxaparin  0.5mg /kg TBW SQ every 24 hours based on BMI being >30.  DESCRIPTION: Pharmacy has adjusted enoxaparin  dose per [facility] policy.  Patient is now receiving enoxaparin  60 mg every 24 hours   Esli Jernigan Rodriguez-Guzman PharmD, BCPS 08/24/2023 3:53 PM

## 2023-08-24 NOTE — ED Notes (Signed)
 Confirmed Dr. Valda Garnet to call patient daughter

## 2023-08-25 ENCOUNTER — Observation Stay (HOSPITAL_BASED_OUTPATIENT_CLINIC_OR_DEPARTMENT_OTHER)
Admit: 2023-08-25 | Discharge: 2023-08-25 | Disposition: A | Discharge status: Home / Self Care | Payer: Medicare HMO | Attending: Internal Medicine | Admitting: Internal Medicine

## 2023-08-25 ENCOUNTER — Encounter: Payer: Self-pay | Admitting: Internal Medicine

## 2023-08-25 ENCOUNTER — Other Ambulatory Visit: Payer: Self-pay

## 2023-08-25 DIAGNOSIS — I2489 Other forms of acute ischemic heart disease: Secondary | ICD-10-CM

## 2023-08-25 DIAGNOSIS — Z794 Long term (current) use of insulin: Secondary | ICD-10-CM | POA: Diagnosis not present

## 2023-08-25 DIAGNOSIS — R079 Chest pain, unspecified: Secondary | ICD-10-CM

## 2023-08-25 DIAGNOSIS — I2081 Angina pectoris with coronary microvascular dysfunction: Secondary | ICD-10-CM | POA: Diagnosis not present

## 2023-08-25 DIAGNOSIS — I5032 Chronic diastolic (congestive) heart failure: Secondary | ICD-10-CM

## 2023-08-25 DIAGNOSIS — E119 Type 2 diabetes mellitus without complications: Secondary | ICD-10-CM | POA: Diagnosis not present

## 2023-08-25 LAB — CBG MONITORING, ED
Glucose-Capillary: 240 mg/dL — ABNORMAL HIGH (ref 70–99)
Glucose-Capillary: 239 mg/dL — ABNORMAL HIGH (ref 70–99)

## 2023-08-25 LAB — BASIC METABOLIC PANEL
Anion gap: 11 (ref 5–15)
BUN: 22 mg/dL (ref 8–23)
CO2: 25 mmol/L (ref 22–32)
Calcium: 8.5 mg/dL — ABNORMAL LOW (ref 8.9–10.3)
Chloride: 92 mmol/L — ABNORMAL LOW (ref 98–111)
Creatinine, Ser: 1.22 mg/dL (ref 0.61–1.24)
GFR, Estimated: 60 mL/min — ABNORMAL LOW (ref >60)
Glucose, Bld: 349 mg/dL — ABNORMAL HIGH (ref 70–99)
Potassium: 4.1 mmol/L (ref 3.5–5.1)
Sodium: 132 mmol/L — ABNORMAL LOW (ref 135–145)

## 2023-08-25 LAB — CBC
HCT: 38.1 % — ABNORMAL LOW (ref 39.0–52.0)
Hemoglobin: 12.8 g/dL — ABNORMAL LOW (ref 13.0–17.0)
MCH: 38.6 pg — ABNORMAL HIGH (ref 26.0–34.0)
MCHC: 33.6 g/dL (ref 30.0–36.0)
MCV: 114.8 fL — ABNORMAL HIGH (ref 80.0–100.0)
Platelets: 118 10*3/uL — ABNORMAL LOW (ref 150–400)
RBC: 3.32 MIL/uL — ABNORMAL LOW (ref 4.22–5.81)
RDW: 15.6 % — ABNORMAL HIGH (ref 11.5–15.5)
WBC: 8.7 10*3/uL (ref 4.0–10.5)
nRBC: 0 % (ref 0.0–0.2)

## 2023-08-25 LAB — ECHOCARDIOGRAM COMPLETE
AR max vel: 1.01 cm2
AV Area VTI: 0.98 cm2
AV Area mean vel: 1.02 cm2
AV Mean grad: 13 mm[Hg]
AV Peak grad: 24.9 mm[Hg]
Ao pk vel: 2.5 m/s
Area-P 1/2: 2.91 cm2
S' Lateral: 2.5 cm

## 2023-08-25 LAB — BRAIN NATRIURETIC PEPTIDE: B Natriuretic Peptide: 105.7 pg/mL — ABNORMAL HIGH (ref 0.0–100.0)

## 2023-08-25 MED ORDER — FUROSEMIDE 10 MG/ML IJ SOLN
40.0000 mg | Freq: Two times a day (BID) | INTRAMUSCULAR | Status: DC
  Administered 2023-08-25: 40 mg via INTRAVENOUS
  Filled 2023-08-25: qty 4

## 2023-08-25 MED ORDER — PERFLUTREN LIPID MICROSPHERE
1.0000 mL | INTRAVENOUS | Status: AC | PRN
  Administered 2023-08-25: 4 mL via INTRAVENOUS

## 2023-08-25 MED ORDER — INSULIN GLARGINE-YFGN 100 UNIT/ML ~~LOC~~ SOLN
10.0000 [IU] | Freq: Two times a day (BID) | SUBCUTANEOUS | Status: DC
  Administered 2023-08-25: 10 [IU] via SUBCUTANEOUS
  Filled 2023-08-25 (×2): qty 0.1

## 2023-08-25 MED ORDER — METOPROLOL SUCCINATE ER 25 MG PO TB24: 25.0000 mg | ORAL_TABLET | Freq: Two times a day (BID) | ORAL | 1 refills | Status: DC

## 2023-08-25 MED ORDER — FUROSEMIDE 10 MG/ML IJ SOLN: 40.0000 mg | Freq: Once | INTRAMUSCULAR | Status: DC

## 2023-08-25 MED ORDER — FUROSEMIDE 40 MG PO TABS: 40.0000 mg | ORAL_TABLET | Freq: Two times a day (BID) | ORAL | 3 refills | Status: DC

## 2023-08-25 NOTE — Evaluation (Signed)
Physical Therapy Evaluation Patient Details Name: Jonathan Cross. MRN: [id number] DOB: 14-Jul-1946 Today's Date: 08/25/2023  History of Present Illness  Jonathan Cross. is a 78 y.o. male with medical history significant of CAD status post CABG, severe aortic stenosis status post bioprosthetic AVR complicated by endocarditis, PAF not on Eliquis due to severe rectal bleeding, chronic HFpEF, HTN, IIDM, MDS chronic normocytic anemia, obesity, OSA, presented with chest pains.  Clinical Impression  Pt in ED in [place] on entry, appears very weak and exhausted. Pt provides some basic background info. Pt barely comes to sitting EOB on his own accord, max effort required. Pt is able to come to standing with RW twice and AMB twice with RW. Pt does not look robust enough for a confident return to home unassisted, but he assures Thereasa Parkin he is weak like this all the time. His baseline level of weakness does not mediate the safety risks associated with this level of activity intolerance. Pt has adequate DME available as needed. HHPT would be incredibly helpful in getting stronger. Will continue to follow.       If plan is discharge home, recommend the following: A lot of help with walking and/or transfers;A lot of help with bathing/dressing/bathroom;Assist for transportation;Help with stairs or ramp for entrance;Assistance with cooking/housework   Can travel by private vehicle        Equipment Recommendations None recommended by PT  Recommendations for Other Services       Functional Status Assessment Patient has had a recent decline in their functional status and demonstrates the ability to make significant improvements in function in a reasonable and predictable amount of time.     Precautions / Restrictions Precautions Precautions: Fall Restrictions Weight Bearing Restrictions Per Provider Order: No      Mobility  Bed Mobility Overal bed mobility: Needs Assistance Bed Mobility:  Supine to Sit, Sit to Supine     Supine to sit: Supervision (maximal effort, appears very uncomfortable) Sit to supine: Modified independent (Device/Increase time)        Transfers Overall transfer level: Needs assistance Equipment used: Rolling walker (2 wheels) Transfers: Sit to/from Stand Sit to Stand: Contact guard assist, Supervision           General transfer comment: from high [place] and from toilet in ED    Ambulation/Gait Ambulation/Gait assistance: Supervision Gait Distance (Feet): 16 Feet (16+21ft) Assistive device: Rolling walker (2 wheels) Gait Pattern/deviations: Step-to pattern       General Gait Details: appears steady, but weak and slow  Stairs            Wheelchair Mobility     Tilt Bed    Modified Rankin (Stroke Patients Only)       Balance                                             Pertinent Vitals/Pain Pain Assessment Pain Assessment: No/denies pain    Home Living Family/patient expects to be discharged to:: Private residence Living Arrangements: Children Available Help at Discharge: Family;Available 24 hours/day Type of Home: House Home Access: Ramped entrance       Home Layout: One level Home Equipment: Grab bars - tub/shower;Rolling Walker (2 wheels);Shower seat;Cane - single point;Rollator (4 wheels) Additional Comments: Lives with dtr available 24/7    Prior Function Prior Level of Function : Independent/Modified Independent  Mobility Comments: Limited to [facility] distances for mobility, tends to hold onto furniture. 1 fall in last 6 mmonths his 4WW rolled out from under him when he went to sit down. ADLs Comments: Ind with ADLs, reports recent decreased in community mobility and IADL management 2/2 fatigue and SOB.     Extremity/Trunk Assessment   Upper Extremity Assessment Upper Extremity Assessment: Generalized weakness    Lower Extremity Assessment Lower Extremity Assessment:  Generalized weakness       Communication   Communication Communication: No apparent difficulties    Cognition                                         Cueing       General Comments      Exercises     Assessment/Plan    PT Assessment Patient needs continued PT services  PT Problem List Decreased strength;Decreased range of motion;Decreased activity tolerance;Decreased balance;Decreased mobility;Decreased coordination;Decreased cognition;Decreased safety awareness;Decreased knowledge of use of DME       PT Treatment Interventions DME instruction;Gait training;Stair training;Functional mobility training;Therapeutic activities;Therapeutic exercise;Balance training;Patient/family education    PT Goals (Current goals can be found in the Care Plan section)  Acute Rehab PT Goals PT Goal Formulation: Patient unable to participate in goal setting    Frequency Min 1X/week     Co-evaluation               AM-PAC PT "6 Clicks" Mobility  Outcome Measure Help needed turning from your back to your side while in a flat bed without using bedrails?: A Little Help needed moving from lying on your back to sitting on the side of a flat bed without using bedrails?: A Little Help needed moving to and from a bed to a chair (including a wheelchair)?: A Little Help needed standing up from a chair using your arms (e.g., wheelchair or bedside chair)?: A Little Help needed to walk in hospital room?: A Little Help needed climbing 3-5 steps with a railing? : A Lot 6 Click Score: 17    End of Session   Activity Tolerance: Patient limited by fatigue;No increased pain Patient left: in bed;with call bell/phone within reach Nurse Communication: Mobility status PT Visit Diagnosis: Difficulty in walking, not elsewhere classified (R26.2);Other abnormalities of gait and mobility (R26.89);Muscle weakness (generalized) (M62.81)    Time: 1610-9604 PT Time Calculation (min) (ACUTE  ONLY): 19 min   Charges:   PT Evaluation $PT Eval Moderate Complexity: 1 Mod PT Treatments $Therapeutic Activity: 8-22 mins PT General Charges $$ ACUTE PT VISIT: 1 Visit        4:17 PM, 08/25/23 Rosamaria Lints, PT, DPT Physical Therapist - [facility]  Outpatient Physical Therapy- Main Campus [phone]     Kellsie Grindle C 08/25/2023, 4:14 PM

## 2023-08-25 NOTE — Hospital Course (Addendum)
Taken from H&P.  Jonathan Cross. is a 78 y.o. 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 Patient was given 1 nitroglycerin and 4 aspirin with EMS resulted in chest pain improvement.   Patient was hospitalized November last year due to similar chest pain, nasal swab patient hemoglobin 8.5.  Subsequently patient went to see hematology at [facility], who considered patient was decompensated from MDS and worsening anemia and Neupogen was started and patient received a total of 4 doses of Epogen infusion weekly x 4 weeks between December 2024 and January 2025.  Hematology hold off the fifth dose of Epogen due to hemoglobin> 12 x 2.   On presentation patient has elevated blood pressure at 168/85, otherwise normal vital.  EKG with no acute ST changes.  Labs with potassium 3.2, hemoglobin 12.7, troponin 52>>61. Cardiology was consulted and patient was admitted under observation for troponin monitoring and echocardiogram.  2/11: Vitals with mildly elevated blood pressure at 156/92, subsequent troponin with some decreased to 49, A1c of 9.2, CBG elevated-added semeglee Echocardiogram with normal EF, mildly elevated left atrial pressure, no other significant abnormality.  Cardiology increased the AM dose of lasix to 80 mg and he will continue with 40 mg in the after noon. Patient seems like having nonspecific chest pain, history of frequent chest pains and it was cardiac disease which need to be managed medically.  Cardiology cleared him for discharge and they will follow-up as outpatient.  Patient is being discharged on current medications and need to have a close  follow-up with his providers for further management.

## 2023-08-25 NOTE — Discharge Summary (Signed)
Physician Discharge Summary   Patient: Jonathan Cross. MRN: [id number] DOB: 07/01/1946  Admit date:     08/24/2023  Discharge date: 08/25/23  Discharge Physician: Arnetha Courser   PCP: Ileene Musa, MD   Recommendations at discharge:  Please obtain CBC and BMP on follow-up Follow-up with cardiology Follow-up with primary care provider  Discharge Diagnoses: Principal Problem:   Chest pain Active Problems:   Angina at rest [facility])   Hospital Course: Taken from H&P.  [name]. is a 78 y.o. 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 Patient was given 1 nitroglycerin and 4 aspirin with EMS resulted in chest pain improvement.   Patient was hospitalized November last year due to similar chest pain, nasal swab patient hemoglobin 8.5.  Subsequently patient went to see hematology at [facility], who considered patient was decompensated from MDS and worsening anemia and Neupogen was started and patient received a total of 4 doses of Epogen infusion weekly x 4 weeks between December 2024 and January 2025.  Hematology hold off the fifth dose of Epogen due to hemoglobin> 12 x 2.   On presentation patient has elevated blood pressure at 168/85, otherwise normal vital.  EKG with no acute ST changes.  Labs with potassium 3.2, hemoglobin 12.7, troponin 52>>61. Cardiology was consulted and patient was admitted under observation for troponin monitoring and echocardiogram.  2/11: Vitals with mildly elevated blood pressure at 156/92, subsequent troponin with some decreased to 49, A1c of 9.2, CBG elevated-added semeglee Echocardiogram with normal EF,  mildly elevated left atrial pressure, no other significant abnormality.  Cardiology increased the AM dose of lasix to 80 mg and he will continue with 40 mg in the after noon. Patient seems like having nonspecific chest pain, history of frequent chest pains and it was cardiac disease which need to be managed medically.  Cardiology cleared him for discharge and they will follow-up as outpatient.  Patient is being discharged on current medications and need to have a close follow-up with his providers for further management.       Consultants: Cardiology Procedures performed: None Disposition: Home Diet recommendation:  Cardiac and Carb modified diet DISCHARGE MEDICATION: Allergies as of 08/25/2023       Reactions   Metformin Diarrhea   Loose stools even with XR        Medication List     TAKE these medications    aspirin EC 81 MG tablet Take 81 mg by mouth daily. Swallow whole.   atorvastatin 80 MG tablet Commonly known as: LIPITOR Take 1 tablet (80 mg total) by mouth every evening.   furosemide 40 MG tablet Commonly known as: LASIX Take 1 tablet (40 mg total) by mouth 2 (two) times daily. Take 2 tablets or 80 mg in the morning and 40 mg in afternoon What changed: additional instructions   insulin NPH-regular Human (70-30) 100 UNIT/ML injection Inject 100 Units into the skin 2 (two) times daily.   isosorbide mononitrate 30 MG 24 hr tablet Commonly known as: IMDUR Take 0.5 tablets (15 mg total) by mouth daily.   metoprolol succinate 25 MG 24 hr tablet Commonly known as: TOPROL-XL Take 1 tablet (25 mg total) by mouth 2 (two) times daily.  nitroGLYCERIN 0.4 MG SL tablet Commonly known as: NITROSTAT Place 1 tablet (0.4 mg total) under the tongue every 5 (five) minutes x 3 doses as needed for chest pain.   omeprazole 20 MG capsule Commonly known as: PRILOSEC Take 2 capsules by mouth once daily   predniSONE 20 MG tablet Commonly known as: DELTASONE Take 20 mg  by mouth daily.   ranolazine 500 MG 12 hr tablet Commonly known as: RANEXA Take 1 tablet by mouth twice daily   tamsulosin 0.4 MG Caps capsule Commonly known as: FLOMAX Take 0.4 mg by mouth daily.        Follow-up Information     Schedule an appointment as soon as possible for a visit  with Ileene Musa, MD.   Specialty: Internal Medicine Contact information: [address] [place] [address] [phone]         [facility] Emergency Department at [facility].   Specialty: Emergency Medicine Why: If symptoms worsen Contact information: [address] [place] [address] [phone]               Discharge Exam: Ceasar Mons Weights   08/24/23 5784  Weight: 117.9 kg   General.  Morbidly obese gentleman, in no acute distress. Pulmonary.  Lungs clear bilaterally, normal respiratory effort. CV.  Regular rate and rhythm, no JVD, rub or murmur. Abdomen.  Soft, nontender, nondistended, BS positive. CNS.  Alert and oriented .  No focal neurologic deficit. Extremities.  No edema, no cyanosis, pulses intact and symmetrical. Psychiatry.  Judgment and insight appears normal.   Condition at discharge: stable  The results of significant diagnostics from this hospitalization (including imaging, microbiology, ancillary and laboratory) are listed below for reference.   Imaging Studies: ECHOCARDIOGRAM COMPLETE Result Date: 08/25/2023    ECHOCARDIOGRAM REPORT   Patient Name:   Jonathan Cross [employer] Date of Exam: 08/25/2023 Medical Rec #:  [id number]              Height:       65.0 in Accession #:    [id number]             Weight:       260.0 lb Date of Birth:  04-23-1946              BSA:          2.211 m Patient Age:    78 years               BP:           156/92 mmHg Patient Gender: M                      HR:           79 bpm. Exam Location:  [facility] Procedure: 2D Echo, Cardiac Doppler, Color Doppler and Intracardiac            Opacification Agent  Indications:     Chest Pain R07.9  History:         Patient has prior history of Echocardiogram examinations, most                  recent 12/09/2022. CHF, CAD and Angina, Arrythmias:Tachycardia                  and Atrial Fibrillation; Risk Factors:Dyslipidemia,                  Hypertension, Diabetes and Sleep Apnea.  Aortic Valve: 23 mm bovine valve is present in the aortic                  position.  Sonographer:     Lucendia Herrlich RCS Referring Phys:  [id number] Emeline General Diagnosing Phys: Yvonne Kendall MD IMPRESSIONS  1. Left ventricular ejection fraction, by estimation, is 65 to 70%. The left ventricle has normal function. Left ventricular endocardial border not optimally defined to evaluate regional wall motion. There is mild left ventricular hypertrophy. Left ventricular diastolic parameters are consistent with Grade I diastolic dysfunction (impaired relaxation). Elevated left atrial pressure.  2. Right ventricular systolic function is mildly reduced. The right ventricular size is normal.  3. The mitral valve is degenerative. Trivial mitral valve regurgitation. Moderate mitral annular calcification.  4. The aortic valve has been repaired/replaced. Aortic valve regurgitation is not visualized. Mild aortic valve stenosis. There is a 23 mm bovine valve present in the aortic position. Aortic valve mean gradient measures 13.0 mmHg.  5. There is mild dilatation of the ascending aorta, measuring 39 mm. FINDINGS  Left Ventricle: Left ventricular ejection fraction, by estimation, is 65 to 70%. The left ventricle has normal function. Left ventricular endocardial border not optimally defined to evaluate regional wall motion. Definity contrast agent was given IV to delineate the left ventricular endocardial borders. The left ventricular internal cavity size was normal in size. There is mild left ventricular hypertrophy. Left ventricular diastolic parameters are consistent with Grade I diastolic  dysfunction (impaired relaxation). Elevated left atrial pressure. Right Ventricle: The right ventricular size is normal. No increase in right ventricular wall thickness. Right ventricular systolic function is mildly reduced. Left Atrium: Left atrial size was normal in size. Right Atrium: Right atrial size was normal in size. Pericardium: The pericardium was not well visualized. Mitral Valve: The mitral valve is degenerative in appearance. There is mild thickening of the mitral valve leaflet(s). Moderate mitral annular calcification. Trivial mitral valve regurgitation. Tricuspid Valve: The tricuspid valve is not well visualized. Tricuspid valve regurgitation is not demonstrated. Aortic Valve: The aortic valve has been repaired/replaced. Aortic valve regurgitation is not visualized. Mild aortic stenosis is present. Aortic valve mean gradient measures 13.0 mmHg. Aortic valve peak gradient measures 24.9 mmHg. Aortic valve area, by VTI measures 0.98 cm. There is a 23 mm bovine valve present in the aortic position. Pulmonic Valve: The pulmonic valve was not well visualized. Pulmonic valve regurgitation is not visualized. No evidence of pulmonic stenosis. Aorta: The aortic root is normal in size and structure. There is mild dilatation of the ascending aorta, measuring 39 mm. IAS/Shunts: The interatrial septum was not well visualized.  LEFT VENTRICLE PLAX 2D LVIDd:         4.20 cm   Diastology LVIDs:         2.50 cm   LV e' medial:    4.40 cm/s LV PW:         1.30 cm   LV E/e' medial:  17.9 LV IVS:        1.00 cm   LV e' lateral:   5.57 cm/s LVOT diam:     1.80 cm   LV E/e' lateral: 14.1 LV SV:         41 LV SV Index:   19 LVOT Area:     2.54 cm  RIGHT VENTRICLE RV S prime:     10.70 cm/s TAPSE (M-mode): 1.5 cm LEFT ATRIUM           Index  RIGHT ATRIUM           Index LA diam:      4.40 cm 1.99 cm/m   RA Area:     17.20 cm LA Vol (A2C): 32.3 ml 14.61 ml/m  RA Volume:   42.10 ml  19.04 ml/m LA Vol (A4C): 35.9 ml  16.21 ml/m  AORTIC VALVE AV Area (Vmax):    1.01 cm AV Area (Vmean):   1.02 cm AV Area (VTI):     0.98 cm AV Vmax:           249.50 cm/s AV Vmean:          166.000 cm/s AV VTI:            0.416 m AV Peak Grad:      24.9 mmHg AV Mean Grad:      13.0 mmHg LVOT Vmax:         99.40 cm/s LVOT Vmean:        66.733 cm/s LVOT VTI:          0.161 m LVOT/AV VTI ratio: 0.39  AORTA Ao Root diam: 2.20 cm Ao Asc diam:  3.90 cm MITRAL VALVE MV Area (PHT): 2.91 cm     SHUNTS MV Decel Time: 261 msec     Systemic VTI:  0.16 m MV E velocity: 78.60 cm/s   Systemic Diam: 1.80 cm MV A velocity: 134.00 cm/s MV E/A ratio:  0.59 Cristal Deer End MD Electronically signed by Yvonne Kendall MD Signature Date/Time: 08/25/2023/3:55:29 PM    Final    DG Chest Portable 1 View Result Date: 08/24/2023 CLINICAL DATA:  Chest pain. EXAM: PORTABLE CHEST 1 VIEW COMPARISON:  Chest radiograph dated June 01, 2023. FINDINGS: The heart size and mediastinal contours are within normal limits. Prior median sternotomy and CABG. Aortic atherosclerosis. No focal consolidation, sizeable pleural effusion, or pneumothorax. No acute osseous abnormality. IMPRESSION: No acute cardiopulmonary findings. Electronically Signed   By: Hart Robinsons M.D.   On: 08/24/2023 08:34    Microbiology: Results for orders placed or performed during the hospital encounter of 06/01/23  Urine Culture     Status: Abnormal   Collection Time: 06/01/23  4:26 AM   Specimen: Urine, Random  Result Value Ref Range Status   Specimen Description   Final    URINE, RANDOM Performed at [facility], [address] [place], [place] [address]    Special Requests   Final    NONE Reflexed from [id number] Performed at [facility], [address] [place], [place] [address]    Culture >=100,000 COLONIES/mL ESCHERICHIA COLI (A)  Final   Report Status 06/04/2023 FINAL  Final   Organism ID, Bacteria ESCHERICHIA COLI (A)  Final      Susceptibility   Escherichia  coli - MIC*    AMPICILLIN 4 SENSITIVE Sensitive     CEFAZOLIN <=4 SENSITIVE Sensitive     CEFEPIME <=0.12 SENSITIVE Sensitive     CEFTRIAXONE <=0.25 SENSITIVE Sensitive     CIPROFLOXACIN <=0.25 SENSITIVE Sensitive     GENTAMICIN <=1 SENSITIVE Sensitive     IMIPENEM <=0.25 SENSITIVE Sensitive     NITROFURANTOIN <=16 SENSITIVE Sensitive     TRIMETH/SULFA <=20 SENSITIVE Sensitive     AMPICILLIN/SULBACTAM <=2 SENSITIVE Sensitive     PIP/TAZO <=4 SENSITIVE Sensitive ug/mL    * >=100,000 COLONIES/mL ESCHERICHIA COLI  Resp panel by RT-PCR (RSV, Flu A&B, Covid) Anterior Nasal Swab     Status: None   Collection Time: 06/01/23  5:33 AM   Specimen: Anterior Nasal Swab  Result Value Ref Range Status   SARS Coronavirus 2 by RT PCR NEGATIVE NEGATIVE Final    Comment: (NOTE) SARS-CoV-2 target nucleic acids are NOT DETECTED.  The SARS-CoV-2 RNA is generally detectable in upper respiratory specimens during the acute phase of infection. The lowest concentration of SARS-CoV-2 viral copies this assay can detect is 138 copies/mL. A negative result does not preclude SARS-Cov-2 infection and should not be used as the sole basis for treatment or other patient management decisions. A negative result may occur with  improper specimen collection/handling, submission of specimen other than nasopharyngeal swab, presence of viral mutation(s) within the areas targeted by this assay, and inadequate number of viral copies(<138 copies/mL). A negative result must be combined with clinical observations, patient history, and epidemiological information. The expected result is Negative.  Fact Sheet for Patients:  BloggerCourse.com  Fact Sheet for Healthcare Providers:  SeriousBroker.it  This test is no t yet approved or cleared by the [place] FDA and  has been authorized for detection and/or diagnosis of SARS-CoV-2 by FDA under an Emergency Use Authorization  (EUA). This EUA will remain  in effect (meaning this test can be used) for the duration of the COVID-19 declaration under Section 564(b)(1) of the Act, 21 U.S.C.section 360bbb-3(b)(1), unless the authorization is terminated  or revoked sooner.       Influenza A by PCR NEGATIVE NEGATIVE Final   Influenza B by PCR NEGATIVE NEGATIVE Final    Comment: (NOTE) The Xpert Xpress SARS-CoV-2/FLU/RSV plus assay is intended as an aid in the diagnosis of influenza from Nasopharyngeal swab specimens and should not be used as a sole basis for treatment. Nasal washings and aspirates are unacceptable for Xpert Xpress SARS-CoV-2/FLU/RSV testing.  Fact Sheet for Patients: BloggerCourse.com  Fact Sheet for Healthcare Providers: SeriousBroker.it  This test is not yet approved or cleared by the [place] FDA and has been authorized for detection and/or diagnosis of SARS-CoV-2 by FDA under an Emergency Use Authorization (EUA). This EUA will remain in effect (meaning this test can be used) for the duration of the COVID-19 declaration under Section 564(b)(1) of the Act, 21 U.S.C. section 360bbb-3(b)(1), unless the authorization is terminated or revoked.     Resp Syncytial Virus by PCR NEGATIVE NEGATIVE Final    Comment: (NOTE) Fact Sheet for Patients: BloggerCourse.com  Fact Sheet for Healthcare Providers: SeriousBroker.it  This test is not yet approved or cleared by the [place] FDA and has been authorized for detection and/or diagnosis of SARS-CoV-2 by FDA under an Emergency Use Authorization (EUA). This EUA will remain in effect (meaning this test can be used) for the duration of the COVID-19 declaration under Section 564(b)(1) of the Act, 21 U.S.C. section 360bbb-3(b)(1), unless the authorization is terminated or revoked.  Performed at [facility], [address]  [place], [place] [address]   Culture, blood (routine x 2) Call MD if unable to obtain prior to antibiotics being given     Status: None   Collection Time: 06/01/23 10:05 PM   Specimen: BLOOD  Result Value Ref Range Status   Specimen Description BLOOD BLOOD LEFT HAND  Final   Special Requests   Final    BOTTLES DRAWN AEROBIC AND ANAEROBIC Blood Culture adequate volume   Culture   Final    NO GROWTH 5 DAYS Performed at [facility], [address] [place], [place] [address]    Report Status 06/06/2023 FINAL  Final  Labs: CBC: Recent Labs  Lab 08/24/23 0832 08/25/23 1440  WBC 8.7 8.7  NEUTROABS 4.9  --   HGB 12.7* 12.8*  HCT 38.1* 38.1*  MCV 114.1* 114.8*  PLT 104* 118*   Basic Metabolic Panel: Recent Labs  Lab 08/24/23 0832 08/25/23 1440  NA 138 132*  K 3.2* 4.1  CL 98 92*  CO2 27 25  GLUCOSE 178* 349*  BUN 23 22  CREATININE 0.99 1.22  CALCIUM 9.0 8.5*   Liver Function Tests: Recent Labs  Lab 08/24/23 0832  AST 20  ALT 33  ALKPHOS 47  BILITOT 0.8  PROT 6.9  ALBUMIN 3.6   CBG: Recent Labs  Lab 08/24/23 1632 08/24/23 2150 08/25/23 0813 08/25/23 1139  GLUCAP 180* 265* 240* 239*    Discharge time spent: greater than 30 minutes.  This record has been created using Conservation officer, historic buildings. Errors have been sought and corrected,but may not always be located. Such creation errors do not reflect on the standard of care.   Signed: Arnetha Courser, MD [facility] 08/25/2023

## 2023-08-25 NOTE — ED Notes (Signed)
Spoke to Pt's daughter and son, Victorino Dike and Leavy Cella.  They will be bringing him clothes and coming to get him.

## 2023-08-25 NOTE — Consult Note (Addendum)
Cardiology Consultation   Patient ID: Jonathan Cross [facility]. MRN: [id number]; DOB: [birth year]  Admit date: 08/24/2023 Date of Consult: 08/25/2023  PCP:  Ileene Musa, MD   [facility] Providers Cardiologist:  Yvonne Kendall, MD   {   Patient Profile:   Jonathan Cross. is a 78 y.o. male with a hx of Cad s/p CABG x3 (LIMA to LAD, VG to OM) in 2017 with concomitant severe aortic stenosis s/p bioprosthetic aortic valve replacement complicated by endocarditis, paroxysmal Afib, myelodiastatic syndrome, iron deficiency anemia, HTN, HLD, DM2, OSA, and BPH who is being seen 08/25/2023 for the evaluation of chest pain at the request of Dr. Nelson Cross.  History of Present Illness:   Jonathan Cross has multiple ER visits and admission for chest pain and dyspnea.  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 Eh is seen regularly and undergoes iron, transfusions and EPO therapy. He hs a h/o rectal bleeding and iron def anemia. He is on ASA 81mg  daily, and off Xarelto for Afib.   Most recent R/L heart cath was 06/2022 which showed significant multivessel CAD, including 80% ostial LAD disease followed by sequential 50-60% mid LAD lesions, 50% pLCx disease, and 70-80% lesion in the proximal segment of rPL1, widely patent LIMA-LAD and SVG-OM2, mildly elevated right heart and pulmonary artery pressures, moderately elevated left heart filling pressures, normal CO/CI. Medical management was recommended. Most recent echo 11/2022 showed LVEF 60-65%, mild LVH, G1DD, mildly reduced RVSF, mild MR  He was admitted in mid November 2024 with acute on chronic chest pain, diarrhea, malaise, fatigue, polyuria, dysuria, and rectal bleeding. Xarelto was discontinued. EKG was non-acute. Troponins were normal. H&H were stable at 85. UA showed UTI. CT chest negative for PE, but showed atypical PNA. He was started on abx and admitted. NO ischemic work-up was  pursued.   He was last seen 06/24/23 and was doing better. No further work-up was pursued.   The patient presented to the ER 08/24/23 for chest pain. Pain started yesterday in the morning. It was centralized and lest sided, pressure like. It did not radiate. He took ASA x4 and SL NTG with improvement. York Spaniel it was 8/10 and worse than prior heart pain he has had. No associated symptoms. He reported he stopped ASA 4-5 days ago due to rectal bleeding, this has since self-resolved. He reports weight is up about 15lbs in the last 2 weeks.  In the ER BP 190/97, pulse 73bpm, RR 15, 99% O2. Labs showed K3.2, BG 178, Lipase 54, Hgb 12.7, plt 104. HS trop 52>61. CXR unremarkable. EKG showed NSR with no ischemic changes. He was given IV hydralazine and admitted for further work-up.   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 04/2022 Echo: EF 60-65%, GrII DD; b. 06/2022 RHC: PA 50/20(30), PCWP 25; c. 11/2022 Echo: EF 60-65%, mild LVH, GrI DD. mildly reduced RV fxn, mild MR, nl fxn'ing bioprosthetic AoV; d. 01/2023 Echo: EF 60-65%, nl fxn'ing AoV, prob mild AS. Nl RV fxn.   Coronary artery disease    a. 01/2016 CABG x 2: LIMA->LAD, VG->OM; b. 05/2021 MV: small, mild, rev apical lateral and apical inf defects ->subtle ischemia vs artifact, EF 55-65%-->low risk; c. 06/2022 Cath: LM 20d, LAD 80ost, 60p/m, 21m, D1 mild dzs, LCX 50ost/p, OM1 mod dzs, RCA mild diff dzs, RPL1 75, VG->OM2 &  LIMA->LAD patent--> Med Rx.   CVA, old, disturbances of vision    Diabetes (HCC)    H/O aortic valve replacement    a. 01/2016 s/p bioprosthetic AVR @ [facility] for Severe AS; b. 05/2021 Echo: EF 55-60%, nl fxn of bioprosthetic AVR; c. 04/2022 Echo: EF 60-65%, nl fxn'ing AoV; d. 11/2022 Echo: nl fxn'ing AoV; e. 01/2023 Echo: nl fxn'ing Aov, mean grad .   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 CHA2DS2VASc = 5-->eliquis/amio; b. 01/2023 Amio d/c'd due to concern re: lung toxicity/ongoing dyspnea.   Retinal vascular occlusion of left eye    Several years ago   Sleep apnea    BiPAP   Vertigo    Wears dentures    full upper and lower.  Lower "broken"    Past Surgical History:  Procedure Laterality Date   AORTIC VALVE REPLACEMENT  2017   [facility], bioprosthetic   APPENDECTOMY     BONE MARROW BIOPSY     05/2022   CARDIAC VALVE REPLACEMENT     CATARACT EXTRACTION W/PHACO Right 11/12/2022   Procedure: CATARACT EXTRACTION PHACO AND INTRAOCULAR LENS PLACEMENT (IOC) RIGHT DIABETIC MALYUGIN  13.49  01:06.1;  Surgeon: Lockie Mola, MD;  Location: [facility];  Service: Ophthalmology;  Laterality: Right;  Diabetic   CORONARY ARTERY BYPASS GRAFT  2017   [facility] - LIMA-LAD and SVG-OM   CYSTOSCOPY W/ RETROGRADES Right 05/27/2019   Procedure: CYSTOSCOPY WITH RETROGRADE PYELOGRAM;  Surgeon: Sondra Come, MD;  Location: [facility];  Service: Urology;  Laterality: Right;   CYSTOSCOPY W/ RETROGRADES Right 07/11/2019   Procedure: CYSTOSCOPY WITH RETROGRADE PYELOGRAM;  Surgeon: Sondra Come, MD;  Location: [facility];  Service: Urology;  Laterality: Right;   CYSTOSCOPY/URETEROSCOPY/HOLMIUM LASER/STENT PLACEMENT Right 05/27/2019   Procedure: CYSTOSCOPY/URETEROSCOPY/HOLMIUM LASER/STENT PLACEMENT;  Surgeon: Sondra Come, MD;  Location: [facility];  Service: Urology;  Laterality: Right;   CYSTOSCOPY/URETEROSCOPY/HOLMIUM LASER/STENT PLACEMENT Right 06/17/2019   Procedure: CYSTOSCOPY/URETEROSCOPY/HOLMIUM LASER/STENT Exchange;  Surgeon: Sondra Come, MD;  Location: [facility];  Service: Urology;  Laterality: Right;   CYSTOSCOPY/URETEROSCOPY/HOLMIUM LASER/STENT PLACEMENT Right 07/11/2019   Procedure: CYSTOSCOPY/URETEROSCOPY/HOLMIUM LASER/STENT  Exchange;  Surgeon: Sondra Come, MD;  Location: [facility];  Service: Urology;  Laterality: Right;   RIGHT HEART CATH AND CORONARY/GRAFT ANGIOGRAPHY Bilateral 06/17/2022   Procedure: RIGHT HEART CATH AND CORONARY/GRAFT ANGIOGRAPHY;  Surgeon: Yvonne Kendall, MD;  Location: [facility];  Service: Cardiovascular;  Laterality: Bilateral;   STONE EXTRACTION WITH BASKET Right 07/11/2019   Procedure: STONE EXTRACTION WITH BASKET;  Surgeon: Sondra Come, MD;  Location: [facility];  Service: Urology;  Laterality: Right;   TEE WITHOUT CARDIOVERSION N/A 04/21/2019   Procedure: TRANSESOPHAGEAL ECHOCARDIOGRAM (TEE);  Surgeon: Antonieta Iba, MD;  Location: [facility];  Service: Cardiovascular;  Laterality: N/A;   TEE WITHOUT CARDIOVERSION N/A 07/04/2019   Procedure: TRANSESOPHAGEAL ECHOCARDIOGRAM (TEE);  Surgeon: Iran Ouch, MD;  Location: [facility];  Service: Cardiovascular;  Laterality: N/A;   TEE WITHOUT CARDIOVERSION N/A 08/17/2019   Procedure: TRANSESOPHAGEAL ECHOCARDIOGRAM (TEE);  Surgeon: Debbe Odea, MD;  Location: [facility];  Service: Cardiovascular;  Laterality: N/A;   TEE WITHOUT CARDIOVERSION N/A 11/15/2019   Procedure: TRANSESOPHAGEAL ECHOCARDIOGRAM (TEE);  Surgeon: Yvonne Kendall, MD;  Location: [facility];  Service: Cardiovascular;  Laterality:  N/A;   TONSILLECTOMY       Home Medications:  Prior to Admission medications   Medication Sig Start Date End Date Taking? Authorizing Provider  aspirin EC 81 MG tablet Take 81 mg by mouth daily. Swallow whole.   Yes [provider]  atorvastatin (LIPITOR) 80 MG tablet Take 1 tablet (80 mg total) by mouth every evening. 11/01/21  Yes Creig Hines, NP  furosemide (LASIX) 40 MG tablet Take 1 tablet (40 mg total) by mouth 2 (two) times daily. 04/10/23 04/09/24 Yes Creig Hines, NP  insulin NPH-regular Human (70-30) 100 UNIT/ML injection Inject 100 Units into the skin 2 (two) times daily. 06/02/23   Yes Marrion Coy, MD  isosorbide mononitrate (IMDUR) 30 MG 24 hr tablet Take 0.5 tablets (15 mg total) by mouth daily. 12/14/22  Yes Enedina Finner, MD  metoprolol succinate (TOPROL-XL) 25 MG 24 hr tablet Take 1 tablet (25 mg total) by mouth 2 (two) times daily. 12/14/22  Yes Enedina Finner, MD  nitroGLYCERIN (NITROSTAT) 0.4 MG SL tablet Place 1 tablet (0.4 mg total) under the tongue every 5 (five) minutes x 3 doses as needed for chest pain. 08/12/22  Yes Sreenath, Sudheer B, MD  omeprazole (PRILOSEC) 20 MG capsule Take 2 capsules by mouth once daily 07/02/23  Yes End, Cristal Deer, MD  predniSONE (DELTASONE) 20 MG tablet Take 20 mg by mouth daily.   Yes [provider]  ranolazine (RANEXA) 500 MG 12 hr tablet Take 1 tablet by mouth twice daily 05/05/23  Yes End, Cristal Deer, MD  tamsulosin (FLOMAX) 0.4 MG CAPS capsule Take 0.4 mg by mouth daily. 03/06/21  Yes [provider]    Inpatient Medications: Scheduled Meds:  aspirin EC  81 mg Oral Daily   atorvastatin  80 mg Oral QPM   enoxaparin (LOVENOX) injection  0.5 mg/kg Subcutaneous Q24H   furosemide  40 mg Oral BID   hydrALAZINE  10 mg Intravenous Once   insulin aspart  0-20 Units Subcutaneous TID WC   insulin aspart  0-5 Units Subcutaneous QHS   insulin glargine-yfgn  10 Units Subcutaneous BID   isosorbide mononitrate  15 mg Oral Daily   metoprolol tartrate  25 mg Oral BID   pantoprazole  40 mg Oral Daily   predniSONE  20 mg Oral Daily   ranolazine  500 mg Oral BID   tamsulosin  0.4 mg Oral Daily   Continuous Infusions:  PRN Meds: acetaminophen, ondansetron (ZOFRAN) IV  Allergies:    Allergies  Allergen Reactions   Metformin Diarrhea    Loose stools even with XR    Social History:   Social History   Socioeconomic History   Marital status: Widowed    Spouse name: Not on file   Number of children: Not on file   Years of education: Not on file   Highest education level: Not on file  Occupational History   Not on  file  Tobacco Use   Smoking status: Former    Current packs/day: 0.00    Types: Cigarettes    Quit date: 1970    Years since quitting: 55.1   Smokeless tobacco: Never  Vaping Use   Vaping status: Never Used  Substance and Sexual Activity   Alcohol use: Never   Drug use: Never   Sexual activity: Not Currently  Other Topics Concern   Not on file  Social History Narrative   Not on file   Social Drivers of Health   Financial Resource Strain: Medium Risk (  02/25/2023)   Received [facility]   Overall Financial Resource Strain (CARDIA)    Difficulty of Paying Living Expenses: Somewhat hard  Food Insecurity: No Food Insecurity (08/25/2023)   Hunger Vital Sign    Worried About Running Out of Food in the Last Year: Never true    Ran Out of Food in the Last Year: Never true  Recent Concern: Food Insecurity - Food Insecurity Present (06/03/2023)   Received from [facility]   Hunger Vital Sign    Worried About Running Out of Food in the Last Year: Sometimes true    Ran Out of Food in the Last Year: Sometimes true  Transportation Needs: No Transportation Needs (08/25/2023)   PRAPARE - Administrator, Civil Service (Medical): No    Lack of Transportation (Non-Medical): No  Physical Activity: Inactive (04/24/2021)   Received from [facility], [facility]   Exercise Vital Sign    Days of Exercise per Week: 0 days    Minutes of Exercise per Session: 0 min  Stress: No Stress Concern Present (04/24/2021)   Received from [facility], [facility] Occupational [facility] - Occupational Stress Questionnaire    Feeling of Stress : Not at all  Social Connections: Socially Isolated (08/25/2023)   Social Connection and Isolation Panel [NHANES]    Frequency of Communication with Friends and Family: Three times a week    Frequency of Social Gatherings with Friends and Family: Twice a week    Attends Religious Services: Never    Automotive engineer or Organizations: No    Attends Banker Meetings: Never    Marital Status: Divorced  Catering manager Violence: Not At Risk (08/25/2023)   Humiliation, Afraid, Rape, and Kick questionnaire    Fear of Current or Ex-Partner: No    Emotionally Abused: No    Physically Abused: No    Sexually Abused: No    Family History:    Family History  Problem Relation Age of Onset   Heart attack Father 29   Heart attack Sister      ROS:  Please see the history of present illness.   All other ROS reviewed and negative.     Physical Exam/Data:   Vitals:   08/25/23 0930 08/25/23 1000 08/25/23 1030 08/25/23 1100  BP:      Pulse: 88 81 80 77  Resp: 16 20 15 10   Temp:      TempSrc:      SpO2: 96% 94% 96% 98%  Weight:      Height:        Intake/Output Summary (Last 24 hours) at 08/25/2023 1211 Last data filed at 08/25/2023 0742 Gross per 24 hour  Intake --  Output 950 ml  Net -950 ml      08/24/2023    8:12 AM 08/06/2023   11:21 AM 06/24/2023   10:18 AM  Last 3 Weights  Weight (lbs) 260 lb 262 lb 255 lb  Weight (kg) 117.935 kg 118.842 kg 115.667 kg     Body mass index is 43.27 kg/m.  General:  Well nourished, well developed, in no acute distress HEENT: normal Neck: no JVD Vascular: No carotid bruits; Distal pulses 2+ bilaterally Cardiac:  normal S1, S2; RRR; no murmur  Lungs:  clear to auscultation bilaterally, no wheezing, rhonchi or rales  Abd: distended Ext: no edema Musculoskeletal:  No deformities, BUE and BLE strength normal and  equal Skin: warm and dry  Neuro:  CNs 2-12 intact, no focal abnormalities noted Psych:  Normal affect   EKG:  The EKG was personally reviewed and demonstrates:  NSR 88bpm, TWI I, II, aVL, V5 and V6, LAD, nonspecific ST changes Telemetry:  Telemetry was personally reviewed and demonstrates:  NSR HR 80s  Relevant CV Studies:  Echo 11/2022 1. Left ventricular ejection fraction, by estimation, is 60 to 65%. The   left ventricle has normal function. Left ventricular endocardial border  not optimally defined to evaluate regional wall motion. There is mild left  ventricular hypertrophy. Left  ventricular diastolic parameters are consistent with Grade I diastolic  dysfunction (impaired relaxation).   2. Right ventricular systolic function is mildly reduced. The right  ventricular size is normal. Tricuspid regurgitation signal is inadequate  for assessing PA pressure.   3. The mitral valve is abnormal. Mild mitral valve regurgitation. No  evidence of mitral stenosis.   4. The aortic valve was not well visualized. Aortic valve regurgitation  is not visualized. No aortic stenosis is present. There is a 23 mm bovine  valve present in the aortic position.   R/L heart cath 11/2022 Conclusions: Significant multivessel coronary artery disease, as detailed below, including 80% ostial LAD disease followed by sequential 50-60% mid LAD lesions, 50% proximal LCx disease, and 70-80% lesion in proximal segment of rPL1. Widely patent LIMA-LAD and SVG-OM2. Moderately elevated left heart filling pressures. Mildly elevated right heart and pulmonary artery pressures. Normal cardiac output/index.   Recommendations: Escalate diuresis with repeat BMP at follow-up visit next week. Secondary prevention of coronary artery disease and continued antianginal therapy. 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   Yvonne Kendall, MD [facility]      Laboratory Data:  High Sensitivity Troponin:   Recent Labs  Lab 08/24/23 1610 08/24/23 1050 08/24/23 1827  TROPONINIHS 52* 61* 49*     Chemistry Recent Labs  Lab 08/24/23 0832  NA 138  K 3.2*  CL 98  CO2 27  GLUCOSE 178*  BUN 23  CREATININE 0.99  CALCIUM 9.0  GFRNONAA >60  ANIONGAP 13    Recent Labs  Lab 08/24/23 0832  PROT 6.9  ALBUMIN 3.6  AST 20   ALT 33  ALKPHOS 47  BILITOT 0.8   Lipids No results for input(s): "CHOL", "TRIG", "HDL", "LABVLDL", "LDLCALC", "CHOLHDL" in the last 168 hours.  Hematology Recent Labs  Lab 08/24/23 0832  WBC 8.7  RBC 3.34*  HGB 12.7*  HCT 38.1*  MCV 114.1*  MCH 38.0*  MCHC 33.3  RDW 15.6*  PLT 104*   Thyroid No results for input(s): "TSH", "FREET4" in the last 168 hours.  BNPNo results for input(s): "BNP", "PROBNP" in the last 168 hours.  DDimer No results for input(s): "DDIMER" in the last 168 hours.   Radiology/Studies:  DG Chest Portable 1 View Result Date: 08/24/2023 CLINICAL DATA:  Chest pain. EXAM: PORTABLE CHEST 1 VIEW COMPARISON:  Chest radiograph dated June 01, 2023. FINDINGS: The heart size and mediastinal contours are within normal limits. Prior median sternotomy and CABG. Aortic atherosclerosis. No focal consolidation, sizeable pleural effusion, or pneumothorax. No acute osseous abnormality. IMPRESSION: No acute cardiopulmonary findings. Electronically Signed   By: Hart Robinsons M.D.   On: 08/24/2023 08:34     Assessment and Plan:   NSTEMI  CAD s/p CABG x2 - HS trop minimally elevated with flat trend, 52>61>49 -  in the setting of severely elevated BP and MDS with underlying CAD - EKG non-ischemic - he reports dull chest pain -  patient reports he briefly stopped ASA 81mg  daily due to rectal bleeding. It was continued on admission - Lipitor 80mg  daily - Imdur 15mg  daily - lopressor 25mg  BID - Ranexa 500mg  BID - echo has been ordered - Last cath 2023 with significant multivessel CAD and patent grafts, recommended medical management. He is not a good candidate for cath/DAPT - BP has improved, may be able to increase Imdur. Can consider outpatient stress test  Paroxysmal Afib - not on DOAC at baseline due to h/o bleeding and MDS - ASA 81mg  daily - he is in NSR  HFpEF - echo 11/2022 showed LVEF 60-65%, mild LVH, G1DD, mildly reduced RVSF, mild MR - patient reports  15lbs weight gain in the last 2 weeks - PTA lasix 40mg  BID - repeat echo ordered - BNP ordered - will given IV lasix 40mg .   S/p AVR - unofficial read by Dr. Okey Dupre shows stable valve with normal function  Rectal bleeding - patient reports rectal bleeding 4-5 days ago that has self-resolved - he stopped ASA - H&H stable - FOBT ordered  HTN - severely elevated upon arrival, now improved - metoprolol 25mg  BID - Imdur 15mg  daily  MDS - followed by [facility]  For questions or updates, please contact [facility] Please consult [email or web address] for contact info under    Signed, Emmory Solivan David Stall, PA-C  08/25/2023 12:11 PM

## 2023-08-25 NOTE — ED Notes (Signed)
Pt verbalized understanding of discharge instructions. Opportunity for questions provided.

## 2023-08-25 NOTE — Evaluation (Signed)
Occupational Therapy Evaluation Patient Details Name: Jonathan Cross. MRN: [id number] DOB: September 06, 1945 Today's Date: 08/25/2023   History of Present Illness   History of Present Illness: Jonathan Cross. is a 78 y.o. male with medical history significant of CAD status post CABG, severe aortic stenosis status post bioprosthetic AVR complicated by endocarditis, PAF not on Eliquis due to severe rectal bleeding, chronic HFpEF, HTN, IIDM, MDS chronic normocytic anemia, obesity, OSA, presented with chest pains.     Clinical Impressions Pt was seen for OT evaluation this date. Prior to hospital admission, pt was generally independent for BADL management, however, he reports being more limited lately 2/2 increased fatigue and weakness. Pt lives with his daughter in a 1 level home with a ramped entrance. Pt endorses regular furniture walking for stability in the home and has a 4WW for longer distances. 1 fall in the last six months. Pt presents to acute OT demonstrating impaired ADL performance and functional mobility 2/2 generalized weakness, decreased activity tolerance, and limited balance (See OT problem list for additional functional deficits). Pt currently requires MIN A for LB ADL Management from STS as well as MIN A for bed/functional mobility.  Pt would benefit from skilled OT services to address noted impairments and functional limitations (see below for any additional details) in order to maximize safety and independence while minimizing falls risk and caregiver burden.Anticipate the need for follow up OT services in the home setting upon acute hospital DC.      If plan is discharge home, recommend the following:   A little help with walking and/or transfers;A little help with bathing/dressing/bathroom;Assistance with cooking/housework;Assist for transportation;Help with stairs or ramp for entrance     Functional Status Assessment   Patient has had a recent decline in their  functional status and demonstrates the ability to make significant improvements in function in a reasonable and predictable amount of time.     Equipment Recommendations   BSC/3in1     Recommendations for Other Services         Precautions/Restrictions   Precautions Precautions: Fall Restrictions Weight Bearing Restrictions Per Provider Order: No     Mobility Bed Mobility Overal bed mobility: Needs Assistance Bed Mobility: Supine to Sit, Sit to Supine     Supine to sit: Min assist, HOB elevated Sit to supine: Supervision, HOB elevated, Used rails        Transfers Overall transfer level: Needs assistance Equipment used: 1 person hand held assist Transfers: Sit to/from Stand Sit to Stand: Min assist                  Balance Overall balance assessment: Needs assistance Sitting-balance support: Feet unsupported, Bilateral upper extremity supported Sitting balance-Leahy Scale: Fair     Standing balance support: Reliant on assistive device for balance, Bilateral upper extremity supported Standing balance-Leahy Scale: Fair Standing balance comment: Reliant on support to maintain static standing balance. Reports feeling unstable in standing.                           ADL either performed or assessed with clinical judgement   ADL Overall ADL's : Needs assistance/impaired                                       General ADL Comments: Pt functionally limited by generalized weakness, decreased activity tolerance, and limited  balance. Pt requires MIN A for bed mobility from ED bed, MIN A to STS with HHA to take a few steps toward HOB. Anticipate MIN A for LB ADL from STS.     Vision Patient Visual Report: No change from baseline       Perception         Praxis         Pertinent Vitals/Pain Pain Assessment Pain Assessment: 0-10 Pain Score: 2  Pain Location: Transient chest pain. Pain Descriptors / Indicators:  Discomfort Pain Intervention(s): Limited activity within patient's tolerance, Monitored during session     Extremity/Trunk Assessment Upper Extremity Assessment Upper Extremity Assessment: Generalized weakness   Lower Extremity Assessment Lower Extremity Assessment: Generalized weakness       Communication Communication Communication: No apparent difficulties   Cognition Arousal: Alert Behavior During Therapy: WFL for tasks assessed/performed Cognition: No apparent impairments             OT - Cognition Comments: A&O x3, did not assess for date.                 Following commands: Intact       Cueing  General Comments   Cueing Techniques: Verbal cues      Exercises Other Exercises Other Exercises: Pt educated on role of OT in acute setting, safety, falls prevention strategies for home and hospital and DC recs.   Shoulder Instructions      Home Living Family/patient expects to be discharged to:: Private residence Living Arrangements: Children Available Help at Discharge: Family;Available 24 hours/day Type of Home: House Home Access: Ramped entrance     Home Layout: One level     Bathroom Shower/Tub: Tub/shower unit         Home Equipment: Grab bars - tub/shower;Rolling Walker (2 wheels);Shower seat;Cane - single point;Rollator (4 wheels)   Additional Comments: Lives with dtr available 24/7      Prior Functioning/Environment Prior Level of Function : Independent/Modified Independent             Mobility Comments: Limited to [facility] distances for mobility, tends to hold onto furniture. 1 fall in last 6 mmonths his 4WW rolled out from under him when he went to sit down. ADLs Comments: Ind with ADLs, reports recent decreased in community mobility and IADL management 2/2 fatigue and SOB.    OT Problem List: Decreased strength;Decreased coordination;Cardiopulmonary status limiting activity;Decreased activity tolerance;Impaired balance (sitting  and/or standing);Decreased knowledge of use of DME or AE   OT Treatment/Interventions: Self-care/ADL training;Therapeutic exercise;Therapeutic activities;DME and/or AE instruction;Patient/family education;Balance training;Energy conservation      OT Goals(Current goals can be found in the care plan section)   Acute Rehab OT Goals Patient Stated Goal: To go home OT Goal Formulation: With patient Time For Goal Achievement: 09/08/23 Potential to Achieve Goals: Good ADL Goals Pt Will Perform Grooming: standing;with modified independence;sitting Pt Will Perform Lower Body Dressing: sit to/from stand;with modified independence;with adaptive equipment Pt Will Transfer to Toilet: ambulating;with modified independence;bedside commode;regular height toilet Pt Will Perform Toileting - Clothing Manipulation and hygiene: sit to/from stand;with modified independence;with adaptive equipment   OT Frequency:  Min 1X/week    Co-evaluation              AM-PAC OT "6 Clicks" Daily Activity     Outcome Measure Help from another person eating meals?: None Help from another person taking care of personal grooming?: A Little Help from another person toileting, which includes using toliet, bedpan, or urinal?: A  Little Help from another person bathing (including washing, rinsing, drying)?: A Little Help from another person to put on and taking off regular upper body clothing?: A Little Help from another person to put on and taking off regular lower body clothing?: A Little 6 Click Score: 19   End of Session Equipment Utilized During Treatment: Gait belt  Activity Tolerance: Patient tolerated treatment well Patient left: in bed;with call bell/phone within reach  OT Visit Diagnosis: Other abnormalities of gait and mobility (R26.89);Muscle weakness (generalized) (M62.81)                Time: 8469-6295 OT Time Calculation (min): 16 min Charges:  OT General Charges $OT Visit: 1 Visit OT  Evaluation $OT Eval Moderate Complexity: 1 Mod  Delorice Bannister Smith Robert, M.S., OTR/L 08/25/23, 3:09 PM

## 2023-08-25 NOTE — ED Notes (Signed)
Cardiology at bedside.

## 2023-08-25 NOTE — Progress Notes (Signed)
Echocardiogram 2D Echocardiogram has been performed.  Jonathan Cross 08/25/2023, 6:20 PM

## 2023-08-25 NOTE — ED Notes (Signed)
Pt continues to wait for son or daughter to arrive and transport him home.

## 2023-09-23 ENCOUNTER — Ambulatory Visit: Payer: Medicare HMO | Admitting: Internal Medicine

## 2023-09-23 NOTE — Progress Notes (Deleted)
  Cardiology Office Note:  .   Date:  09/23/2023  ID:  Jonathan Shelling., DOB 08-13-1945, MRN [id number] PCP: Ileene Musa, MD  [facility] Providers Cardiologist:  Yvonne Kendall, MD { Click to update primary MD,subspecialty MD or APP then REFRESH:1}    History of Present Illness: Marland Kitchen   Jonathan Higinbotham. is a 78 y.o. male with a history of CAD status post CABG x2 (LIMA to the LAD, vein graft to the obtuse marginal), severe aortic stenosis status post bioprosthetic aortic valve replacement complicated by endocarditis, paroxysmal atrial fibrillation, hypertension, hyperlipidemia, type 2 diabetes mellitus, sleep apnea, and BPH, who presents for follow-up of chest pain, valvular heart disease, and paroxysmal atrial fibrillation.  He was last seen in our office by Ward Givens, NP, in December, at which time he was feeling a little bit better with less frequent chest pain.  No medication changes or additional testing were pursued at that time.  He was hospitalized last month with recurrent chest pain and mild troponin elevation.  He had stopped taking his aspirin 5 days earlier in the setting of rectal bleeding and diarrhea he also reported a 15 pound weight gain over the course of 2 weeks though he did not feel like he was retaining fluid.  It was felt that his troponin elevation was due to supply-demand mismatch.  Furosemide was increased to 80 mg every morning and 40 mg every afternoon.  Antianginal therapy consisting of metoprolol succinate, isosorbide mononitrate, and ranolazine were continued.  ROS: See HPI  Studies Reviewed: Marland Kitchen        TTE (08/25/2023): Normal LV size with mild LVH.  LVEF 65-70% with grade 1 diastolic dysfunction and elevated filling pressure.  Normal RV size and function.  Normal biatrial size.  No pericardial effusion.  Degenerative mitral valve with moderate annular calcification and trivial regurgitation.  Bioprosthetic aortic valve present with mean gradient  13 mmHg.  Mildly dilated ascending aorta present, measuring 3.9 cm.  Risk Assessment/Calculations:   {Does this patient have ATRIAL FIBRILLATION?:[phone]} No BP recorded.  {Refresh Note OR Click here to enter BP  :1}***       Physical Exam:   VS:  There were no vitals taken for this visit.   Wt Readings from Last 3 Encounters:  08/24/23 260 lb (117.9 kg)  08/06/23 262 lb (118.8 kg)  06/24/23 255 lb (115.7 kg)    General:  NAD. Neck: No JVD or HJR. Lungs: Clear to auscultation bilaterally without wheezes or crackles. Heart: Regular rate and rhythm without murmurs, rubs, or gallops. Abdomen: Soft, nontender, nondistended. Extremities: No lower extremity edema.  ASSESSMENT AND PLAN: .    ***    {Are you ordering a CV Procedure (e.g. stress test, cath, DCCV, TEE, etc)?   Press F2        :[id number]}  Dispo: ***  Signed, Yvonne Kendall, MD

## 2023-11-04 ENCOUNTER — Inpatient Hospital Stay: Payer: Medicare HMO | Attending: Oncology

## 2023-11-11 ENCOUNTER — Inpatient Hospital Stay: Payer: Medicare HMO | Admitting: Oncology

## 2023-11-11 ENCOUNTER — Other Ambulatory Visit

## 2023-11-11 ENCOUNTER — Inpatient Hospital Stay: Payer: Medicare HMO

## 2023-11-11 NOTE — Assessment & Plan Note (Deleted)
 ZRSR2 mutation.MDS-IB-1 [facility] Dr. Zenaida Niece Deventer's notes were reviewed.  I agree with initiating EPO therapy if his hemoglobin is <=8 or <=9 with evidence of myocardium ischemia. His Hb has improved after IV venofer treatment  No need for retacrit.

## 2023-11-25 ENCOUNTER — Ambulatory Visit: Attending: Internal Medicine | Admitting: Internal Medicine

## 2023-11-25 NOTE — Progress Notes (Deleted)
  Cardiology Office Note:  .   Date:  11/25/2023  ID:  Arlyce Berger., DOB 1945-07-24, MRN [id number] PCP: Lucina Sabal, MD  [facility] Providers Cardiologist:  Sammy Crisp, MD { Click to update primary MD,subspecialty MD or APP then REFRESH:1}    History of Present Illness: Aaron Aas   Aijalon Wingett. is a 78 y.o. male with history of coronary artery disease status post two-vessel CABG in 2017 (LIMA-LAD and SVG-OM), severe aortic stenosis status post bioprosthetic AVR in 2017 complicated by endocarditis, paroxysmal atrial fibrillation, hypertension, hyperlipidemia, type 2 diabetes mellitus, obstructive sleep apnea, and BPH, who presents for follow-up of chronic chest pain.  He was last seen in our office in 06/2023 by Odessa Bene, NP, at which time Mr. Tipper was doing a little better with less frequent chest pain following a hospitalization in mid 05/2019 for.  Some rectal bleeding had preceded this prompting cessation of rivaroxaban .  Chest CT at that time was also concerning for atypical pneumonia treated with azithromycin  and ceftriaxone .  No medication changes or additional testing were pursued at that time.  He was hospitalized at [facility] in 09/2023 with a gluteal wound growing MRSA.  He subsequently developed MRSA bacteremia with TEE showing no vegetation.  ROS: See HPI  Studies Reviewed: Aaron Aas        TEE (10/12/2023, [facility]): Normal LV size with mild LVH.  LVEF 60%.  Bioprosthetic AVR without evidence of vegetation or abscess.  Normal RV size and function.  No left or right atrial thrombus.  Risk Assessment/Calculations:   {Does this patient have ATRIAL FIBRILLATION?:[id number]} No BP recorded.  {Refresh Note OR Click here to enter BP  :1}***       Physical Exam:   VS:  There were no vitals taken for this visit.   Wt Readings from Last 3 Encounters:  08/24/23 260 lb (117.9 kg)  08/06/23 262 lb (118.8 kg)  06/24/23 255 lb (115.7 kg)    General:  NAD. Neck:  No JVD or HJR. Lungs: Clear to auscultation bilaterally without wheezes or crackles. Heart: Regular rate and rhythm without murmurs, rubs, or gallops. Abdomen: Soft, nontender, nondistended. Extremities: No lower extremity edema.  ASSESSMENT AND PLAN: .    ***    {Are you ordering a CV Procedure (e.g. stress test, cath, DCCV, TEE, etc)?   Press F2        :[id number]}  Dispo: ***  Signed, Sammy Crisp, MD

## 2023-12-09 ENCOUNTER — Other Ambulatory Visit: Payer: Self-pay

## 2023-12-09 ENCOUNTER — Inpatient Hospital Stay
Admission: EM | Admit: 2023-12-09 | Discharge: 2023-12-15 | DRG: 872 | Disposition: A | Discharge status: Home Health | Attending: Internal Medicine | Admitting: Internal Medicine

## 2023-12-09 ENCOUNTER — Encounter: Payer: Self-pay | Admitting: Internal Medicine

## 2023-12-09 ENCOUNTER — Emergency Department

## 2023-12-09 DIAGNOSIS — E785 Hyperlipidemia, unspecified: Secondary | ICD-10-CM | POA: Diagnosis present

## 2023-12-09 DIAGNOSIS — Z951 Presence of aortocoronary bypass graft: Secondary | ICD-10-CM

## 2023-12-09 DIAGNOSIS — X58XXXA Exposure to other specified factors, initial encounter: Secondary | ICD-10-CM | POA: Diagnosis present

## 2023-12-09 DIAGNOSIS — R6520 Severe sepsis without septic shock: Secondary | ICD-10-CM | POA: Diagnosis present

## 2023-12-09 DIAGNOSIS — E119 Type 2 diabetes mellitus without complications: Secondary | ICD-10-CM | POA: Diagnosis not present

## 2023-12-09 DIAGNOSIS — Z6841 Body Mass Index (BMI) 40.0 and over, adult: Secondary | ICD-10-CM | POA: Diagnosis not present

## 2023-12-09 DIAGNOSIS — Z953 Presence of xenogenic heart valve: Secondary | ICD-10-CM | POA: Diagnosis not present

## 2023-12-09 DIAGNOSIS — I5032 Chronic diastolic (congestive) heart failure: Secondary | ICD-10-CM | POA: Diagnosis present

## 2023-12-09 DIAGNOSIS — L02611 Cutaneous abscess of right foot: Secondary | ICD-10-CM | POA: Diagnosis present

## 2023-12-09 DIAGNOSIS — B9562 Methicillin resistant Staphylococcus aureus infection as the cause of diseases classified elsewhere: Secondary | ICD-10-CM | POA: Diagnosis present

## 2023-12-09 DIAGNOSIS — D696 Thrombocytopenia, unspecified: Secondary | ICD-10-CM | POA: Diagnosis present

## 2023-12-09 DIAGNOSIS — A419 Sepsis, unspecified organism: Secondary | ICD-10-CM | POA: Diagnosis present

## 2023-12-09 DIAGNOSIS — E872 Acidosis, unspecified: Secondary | ICD-10-CM | POA: Diagnosis present

## 2023-12-09 DIAGNOSIS — I2510 Atherosclerotic heart disease of native coronary artery without angina pectoris: Secondary | ICD-10-CM | POA: Diagnosis present

## 2023-12-09 DIAGNOSIS — I480 Paroxysmal atrial fibrillation: Secondary | ICD-10-CM | POA: Diagnosis present

## 2023-12-09 DIAGNOSIS — E66813 Obesity, class 3: Secondary | ICD-10-CM | POA: Diagnosis present

## 2023-12-09 DIAGNOSIS — K219 Gastro-esophageal reflux disease without esophagitis: Secondary | ICD-10-CM | POA: Diagnosis present

## 2023-12-09 DIAGNOSIS — Z87442 Personal history of urinary calculi: Secondary | ICD-10-CM

## 2023-12-09 DIAGNOSIS — Z794 Long term (current) use of insulin: Secondary | ICD-10-CM | POA: Diagnosis not present

## 2023-12-09 DIAGNOSIS — S90821A Blister (nonthermal), right foot, initial encounter: Secondary | ICD-10-CM | POA: Diagnosis present

## 2023-12-09 DIAGNOSIS — I10 Essential (primary) hypertension: Secondary | ICD-10-CM | POA: Diagnosis present

## 2023-12-09 DIAGNOSIS — E1140 Type 2 diabetes mellitus with diabetic neuropathy, unspecified: Secondary | ICD-10-CM | POA: Diagnosis present

## 2023-12-09 DIAGNOSIS — N400 Enlarged prostate without lower urinary tract symptoms: Secondary | ICD-10-CM | POA: Diagnosis present

## 2023-12-09 DIAGNOSIS — D539 Nutritional anemia, unspecified: Secondary | ICD-10-CM | POA: Diagnosis present

## 2023-12-09 DIAGNOSIS — Z604 Social exclusion and rejection: Secondary | ICD-10-CM | POA: Diagnosis present

## 2023-12-09 DIAGNOSIS — I69398 Other sequelae of cerebral infarction: Secondary | ICD-10-CM

## 2023-12-09 DIAGNOSIS — Z8249 Family history of ischemic heart disease and other diseases of the circulatory system: Secondary | ICD-10-CM

## 2023-12-09 DIAGNOSIS — Z1152 Encounter for screening for COVID-19: Secondary | ICD-10-CM | POA: Diagnosis not present

## 2023-12-09 DIAGNOSIS — R079 Chest pain, unspecified: Secondary | ICD-10-CM | POA: Diagnosis present

## 2023-12-09 DIAGNOSIS — R8271 Bacteriuria: Secondary | ICD-10-CM | POA: Diagnosis present

## 2023-12-09 DIAGNOSIS — Z7982 Long term (current) use of aspirin: Secondary | ICD-10-CM

## 2023-12-09 DIAGNOSIS — I110 Hypertensive heart disease with heart failure: Secondary | ICD-10-CM | POA: Diagnosis present

## 2023-12-09 DIAGNOSIS — I4892 Unspecified atrial flutter: Secondary | ICD-10-CM | POA: Diagnosis present

## 2023-12-09 DIAGNOSIS — E1165 Type 2 diabetes mellitus with hyperglycemia: Secondary | ICD-10-CM

## 2023-12-09 DIAGNOSIS — Z9841 Cataract extraction status, right eye: Secondary | ICD-10-CM

## 2023-12-09 DIAGNOSIS — Z961 Presence of intraocular lens: Secondary | ICD-10-CM | POA: Diagnosis present

## 2023-12-09 DIAGNOSIS — D469 Myelodysplastic syndrome, unspecified: Secondary | ICD-10-CM | POA: Diagnosis present

## 2023-12-09 DIAGNOSIS — Z79899 Other long term (current) drug therapy: Secondary | ICD-10-CM

## 2023-12-09 DIAGNOSIS — L03115 Cellulitis of right lower limb: Secondary | ICD-10-CM | POA: Diagnosis present

## 2023-12-09 DIAGNOSIS — Z888 Allergy status to other drugs, medicaments and biological substances status: Secondary | ICD-10-CM

## 2023-12-09 DIAGNOSIS — Z87891 Personal history of nicotine dependence: Secondary | ICD-10-CM

## 2023-12-09 DIAGNOSIS — G4733 Obstructive sleep apnea (adult) (pediatric): Secondary | ICD-10-CM | POA: Diagnosis present

## 2023-12-09 DIAGNOSIS — N390 Urinary tract infection, site not specified: Secondary | ICD-10-CM | POA: Diagnosis present

## 2023-12-09 DIAGNOSIS — H539 Unspecified visual disturbance: Secondary | ICD-10-CM | POA: Diagnosis present

## 2023-12-09 DIAGNOSIS — I4891 Unspecified atrial fibrillation: Secondary | ICD-10-CM | POA: Diagnosis present

## 2023-12-09 DIAGNOSIS — R531 Weakness: Secondary | ICD-10-CM | POA: Diagnosis present

## 2023-12-09 LAB — CBC WITH DIFFERENTIAL/PLATELET
Abs Immature Granulocytes: 0.16 10*3/uL — ABNORMAL HIGH (ref 0.00–0.07)
Basophils Absolute: 0 10*3/uL (ref 0.0–0.1)
Basophils Relative: 0 %
Eosinophils Absolute: 0 10*3/uL (ref 0.0–0.5)
Eosinophils Relative: 0 %
HCT: 31.3 % — ABNORMAL LOW (ref 39.0–52.0)
Hemoglobin: 9.9 g/dL — ABNORMAL LOW (ref 13.0–17.0)
Immature Granulocytes: 1 %
Lymphocytes Relative: 18 %
Lymphs Abs: 2 10*3/uL (ref 0.7–4.0)
MCH: 36.8 pg — ABNORMAL HIGH (ref 26.0–34.0)
MCHC: 31.6 g/dL (ref 30.0–36.0)
MCV: 116.4 fL — ABNORMAL HIGH (ref 80.0–100.0)
Monocytes Absolute: 1.1 10*3/uL — ABNORMAL HIGH (ref 0.1–1.0)
Monocytes Relative: 10 %
Neutro Abs: 7.9 10*3/uL — ABNORMAL HIGH (ref 1.7–7.7)
Neutrophils Relative %: 71 %
Platelets: 136 10*3/uL — ABNORMAL LOW (ref 150–400)
RBC: 2.69 MIL/uL — ABNORMAL LOW (ref 4.22–5.81)
RDW: 16.2 % — ABNORMAL HIGH (ref 11.5–15.5)
Smear Review: NORMAL
WBC: 11.3 10*3/uL — ABNORMAL HIGH (ref 4.0–10.5)
nRBC: 0 % (ref 0.0–0.2)

## 2023-12-09 LAB — URINALYSIS, W/ REFLEX TO CULTURE (INFECTION SUSPECTED)
Bilirubin Urine: NEGATIVE
Glucose, UA: NEGATIVE mg/dL
Ketones, ur: NEGATIVE mg/dL
Nitrite: NEGATIVE
Protein, ur: 100 mg/dL — AB
Specific Gravity, Urine: 1.018 (ref 1.005–1.030)
WBC, UA: >50 WBC/hpf (ref 0–5)
pH: 5 (ref 5.0–8.0)

## 2023-12-09 LAB — RESP PANEL BY RT-PCR (RSV, FLU A&B, COVID)  RVPGX2
Influenza A by PCR: NEGATIVE
Influenza B by PCR: NEGATIVE
Resp Syncytial Virus by PCR: NEGATIVE
SARS Coronavirus 2 by RT PCR: NEGATIVE

## 2023-12-09 LAB — COMPREHENSIVE METABOLIC PANEL WITH GFR
ALT: 16 U/L (ref 0–44)
AST: 20 U/L (ref 15–41)
Albumin: 3.3 g/dL — ABNORMAL LOW (ref 3.5–5.0)
Alkaline Phosphatase: 42 U/L (ref 38–126)
Anion gap: 11 (ref 5–15)
BUN: 16 mg/dL (ref 8–23)
CO2: 22 mmol/L (ref 22–32)
Calcium: 9 mg/dL (ref 8.9–10.3)
Chloride: 100 mmol/L (ref 98–111)
Creatinine, Ser: 1.2 mg/dL (ref 0.61–1.24)
GFR, Estimated: >60 mL/min (ref >60)
Glucose, Bld: 204 mg/dL — ABNORMAL HIGH (ref 70–99)
Potassium: 3.7 mmol/L (ref 3.5–5.1)
Sodium: 133 mmol/L — ABNORMAL LOW (ref 135–145)
Total Bilirubin: 1.3 mg/dL — ABNORMAL HIGH (ref 0.0–1.2)
Total Protein: 7.9 g/dL (ref 6.5–8.1)

## 2023-12-09 LAB — PROTIME-INR
INR: 1.2 (ref 0.8–1.2)
Prothrombin Time: 15.9 s — ABNORMAL HIGH (ref 11.4–15.2)

## 2023-12-09 LAB — LACTIC ACID, PLASMA
Lactic Acid, Venous: 2.7 mmol/L (ref 0.5–1.9)
Lactic Acid, Venous: 1.4 mmol/L (ref 0.5–1.9)

## 2023-12-09 LAB — GLUCOSE, CAPILLARY: Glucose-Capillary: 232 mg/dL — ABNORMAL HIGH (ref 70–99)

## 2023-12-09 MED ORDER — MORPHINE SULFATE (PF) 4 MG/ML IV SOLN
4.0000 mg | Freq: Once | INTRAVENOUS | Status: AC
  Administered 2023-12-09: 4 mg via INTRAVENOUS
  Filled 2023-12-09: qty 1

## 2023-12-09 MED ORDER — TAMSULOSIN HCL 0.4 MG PO CAPS
0.4000 mg | ORAL_CAPSULE | Freq: Every day | ORAL | Status: DC
  Administered 2023-12-09 – 2023-12-15 (×7): 0.4 mg via ORAL
  Filled 2023-12-09 (×7): qty 1

## 2023-12-09 MED ORDER — INSULIN GLARGINE-YFGN 100 UNIT/ML ~~LOC~~ SOLN
50.0000 [IU] | Freq: Every day | SUBCUTANEOUS | Status: DC
  Administered 2023-12-09 – 2023-12-14 (×6): 50 [IU] via SUBCUTANEOUS
  Filled 2023-12-09 (×7): qty 0.5

## 2023-12-09 MED ORDER — ORAL CARE MOUTH RINSE: 15.0000 mL | OROMUCOSAL | Status: DC | PRN

## 2023-12-09 MED ORDER — ONDANSETRON HCL 4 MG/2ML IJ SOLN
4.0000 mg | Freq: Once | INTRAMUSCULAR | Status: AC
  Administered 2023-12-09: 4 mg via INTRAVENOUS
  Filled 2023-12-09: qty 2

## 2023-12-09 MED ORDER — HYDRALAZINE HCL 20 MG/ML IJ SOLN: 5.0000 mg | Freq: Four times a day (QID) | INTRAMUSCULAR | Status: AC | PRN

## 2023-12-09 MED ORDER — INSULIN ASPART 100 UNIT/ML IJ SOLN
0.0000 [IU] | Freq: Three times a day (TID) | INTRAMUSCULAR | Status: DC
  Administered 2023-12-10 (×3): 4 [IU] via SUBCUTANEOUS
  Administered 2023-12-11 (×3): 3 [IU] via SUBCUTANEOUS
  Administered 2023-12-12: 7 [IU] via SUBCUTANEOUS
  Administered 2023-12-12: 4 [IU] via SUBCUTANEOUS
  Administered 2023-12-12: 3 [IU] via SUBCUTANEOUS
  Administered 2023-12-13: 4 [IU] via SUBCUTANEOUS
  Administered 2023-12-13 (×2): 3 [IU] via SUBCUTANEOUS
  Administered 2023-12-14 (×2): 4 [IU] via SUBCUTANEOUS
  Administered 2023-12-14: 7 [IU] via SUBCUTANEOUS
  Administered 2023-12-15 (×2): 4 [IU] via SUBCUTANEOUS
  Filled 2023-12-09 (×16): qty 1

## 2023-12-09 MED ORDER — RANOLAZINE ER 500 MG PO TB12
500.0000 mg | ORAL_TABLET | Freq: Two times a day (BID) | ORAL | Status: DC
  Administered 2023-12-09 – 2023-12-15 (×12): 500 mg via ORAL
  Filled 2023-12-09 (×16): qty 1

## 2023-12-09 MED ORDER — MORPHINE SULFATE (PF) 2 MG/ML IV SOLN
2.0000 mg | INTRAVENOUS | Status: DC | PRN
  Administered 2023-12-10 (×4): 2 mg via INTRAVENOUS
  Filled 2023-12-09 (×4): qty 1

## 2023-12-09 MED ORDER — VANCOMYCIN HCL IN DEXTROSE 1-5 GM/200ML-% IV SOLN
1000.0000 mg | Freq: Once | INTRAVENOUS | Status: AC
  Administered 2023-12-09: 1000 mg via INTRAVENOUS
  Filled 2023-12-09: qty 200

## 2023-12-09 MED ORDER — PANTOPRAZOLE SODIUM 40 MG PO TBEC
80.0000 mg | DELAYED_RELEASE_TABLET | Freq: Every day | ORAL | Status: DC
  Administered 2023-12-10 – 2023-12-15 (×6): 80 mg via ORAL
  Filled 2023-12-09 (×6): qty 2

## 2023-12-09 MED ORDER — ONDANSETRON HCL 4 MG PO TABS: 4.0000 mg | ORAL_TABLET | Freq: Four times a day (QID) | ORAL | Status: AC | PRN

## 2023-12-09 MED ORDER — LACTATED RINGERS IV SOLN
150.0000 mL/h | INTRAVENOUS | Status: AC
  Administered 2023-12-09 – 2023-12-10 (×2): 150 mL/h via INTRAVENOUS

## 2023-12-09 MED ORDER — VANCOMYCIN HCL 1.25 G IV SOLR
1250.0000 mg | INTRAVENOUS | Status: DC
  Filled 2023-12-09: qty 25

## 2023-12-09 MED ORDER — NITROGLYCERIN 0.4 MG SL SUBL: 0.4000 mg | SUBLINGUAL_TABLET | SUBLINGUAL | Status: DC | PRN

## 2023-12-09 MED ORDER — METOPROLOL SUCCINATE ER 25 MG PO TB24
12.5000 mg | ORAL_TABLET | Freq: Two times a day (BID) | ORAL | Status: DC
  Administered 2023-12-09 – 2023-12-15 (×12): 12.5 mg via ORAL
  Filled 2023-12-09 (×12): qty 1

## 2023-12-09 MED ORDER — ACETAMINOPHEN 325 MG PO TABS
650.0000 mg | ORAL_TABLET | Freq: Four times a day (QID) | ORAL | Status: DC | PRN
Start: 2023-12-09 — End: 2023-12-15
  Administered 2023-12-10 – 2023-12-11 (×2): 650 mg via ORAL
  Filled 2023-12-09 (×2): qty 2

## 2023-12-09 MED ORDER — SODIUM CHLORIDE 0.9 % IV BOLUS
1000.0000 mL | Freq: Once | INTRAVENOUS | Status: AC
  Administered 2023-12-09: 1000 mL via INTRAVENOUS

## 2023-12-09 MED ORDER — ACETAMINOPHEN 500 MG PO TABS
1000.0000 mg | ORAL_TABLET | Freq: Once | ORAL | Status: AC
  Administered 2023-12-09: 1000 mg via ORAL
  Filled 2023-12-09: qty 2

## 2023-12-09 MED ORDER — LACTATED RINGERS IV BOLUS: 1000.0000 mL | Freq: Once | INTRAVENOUS | Status: DC

## 2023-12-09 MED ORDER — HEPARIN SODIUM (PORCINE) 5000 UNIT/ML IJ SOLN
5000.0000 [IU] | Freq: Three times a day (TID) | INTRAMUSCULAR | Status: AC
  Administered 2023-12-09: 5000 [IU] via SUBCUTANEOUS
  Filled 2023-12-09: qty 1

## 2023-12-09 MED ORDER — ACETAMINOPHEN 650 MG RE SUPP: 650.0000 mg | Freq: Four times a day (QID) | RECTAL | Status: DC | PRN

## 2023-12-09 MED ORDER — PIPERACILLIN-TAZOBACTAM 3.375 G IVPB 30 MIN
3.3750 g | Freq: Once | INTRAVENOUS | Status: AC
  Administered 2023-12-09: 3.375 g via INTRAVENOUS
  Filled 2023-12-09 (×2): qty 50

## 2023-12-09 MED ORDER — ONDANSETRON HCL 4 MG/2ML IJ SOLN
4.0000 mg | Freq: Four times a day (QID) | INTRAMUSCULAR | Status: AC | PRN
  Administered 2023-12-10: 4 mg via INTRAVENOUS
  Filled 2023-12-09: qty 2

## 2023-12-09 MED ORDER — INSULIN ASPART 100 UNIT/ML IJ SOLN
0.0000 [IU] | Freq: Every day | INTRAMUSCULAR | Status: DC
  Administered 2023-12-09 – 2023-12-12 (×4): 2 [IU] via SUBCUTANEOUS
  Filled 2023-12-09 (×4): qty 1

## 2023-12-09 MED ORDER — LACTATED RINGERS IV BOLUS (SEPSIS)
1000.0000 mL | Freq: Once | INTRAVENOUS | Status: AC
  Administered 2023-12-09: 1000 mL via INTRAVENOUS

## 2023-12-09 MED ORDER — SODIUM CHLORIDE 0.9 % IV SOLN
2.0000 g | Freq: Two times a day (BID) | INTRAVENOUS | Status: DC
  Administered 2023-12-10 (×2): 2 g via INTRAVENOUS
  Filled 2023-12-09 (×2): qty 12.5

## 2023-12-09 MED ORDER — ATORVASTATIN CALCIUM 20 MG PO TABS
80.0000 mg | ORAL_TABLET | Freq: Every evening | ORAL | Status: DC
  Administered 2023-12-09 – 2023-12-14 (×6): 80 mg via ORAL
  Filled 2023-12-09 (×8): qty 4

## 2023-12-09 MED ORDER — ISOSORBIDE MONONITRATE ER 30 MG PO TB24
15.0000 mg | ORAL_TABLET | Freq: Every day | ORAL | Status: DC
  Administered 2023-12-11 – 2023-12-15 (×5): 15 mg via ORAL
  Filled 2023-12-09 (×5): qty 1

## 2023-12-09 MED ORDER — SENNOSIDES-DOCUSATE SODIUM 8.6-50 MG PO TABS: 1.0000 | ORAL_TABLET | Freq: Every evening | ORAL | Status: DC | PRN

## 2023-12-09 MED ORDER — VANCOMYCIN HCL 1500 MG/300ML IV SOLN
1500.0000 mg | Freq: Once | INTRAVENOUS | Status: AC
  Administered 2023-12-09: 1500 mg via INTRAVENOUS
  Filled 2023-12-09: qty 300

## 2023-12-09 NOTE — ED Notes (Signed)
 Admitting provider at bedside.

## 2023-12-09 NOTE — ED Notes (Signed)
 Pt to be transported to the floor at this time

## 2023-12-09 NOTE — Assessment & Plan Note (Addendum)
 Patient takes NPH 100 units twice daily Pharmacy consult placed to convert NPH 100 units twice daily to Semglee  Insulin  SSI with at bedtime coverage, obese dosing ordered

## 2023-12-09 NOTE — Hospital Course (Signed)
 Mr. Ibrohim Simmers, Marieta Shorten. is an 78 year old male with history of insulin -dependent diabetes mellitus, hypertension, GERD, hyperlipidemia, who presents emergency department for chief concerns of fever, cough, chills.  Vitals in the ED showed t of 101.2, respiration rate of 17, heart rate 107, blood pressure 152/75, SpO2 95% on room air.  Serum sodium is 133, potassium 3.7, chloride 100, bicarb 22, BUN of 16, serum creatinine 1.20, EGFR greater than 60, nonfasting blood glucose 204, WBC 11.3, hemoglobin 9.9, platelets of 136.  T. bili was elevated at 1.3.  COVID/influenza A/influenza B/RSV PCR were negative.  Lactic acidosis was 2.7.  ED treatment: Acetaminophen  1000 mg p.o. one-time dose, morphine  4 mg IV one-time dose, ondansetron  4 mg IV one-time dose, NS 1 L bolus, vancomycin  and Zosyn per pharmacy.

## 2023-12-09 NOTE — Progress Notes (Signed)
 Elink is following code sepsis.

## 2023-12-09 NOTE — Assessment & Plan Note (Signed)
 CPAP nightly

## 2023-12-09 NOTE — ED Triage Notes (Signed)
 Pt presents to the ED via [facility] from home. Pt has had cough, GI upset, fever, and body aches since Sunday. Pt is a diabetic and does have a wound on foot with heat present. EMS vitals: CBG 227 Temp 99.8 HR 102 BP 127/43 98% RA

## 2023-12-09 NOTE — Consult Note (Signed)
 Pharmacy Antibiotic Note  Jonathan Cross. is a 78 y.o. male admitted on 12/09/2023 with cellulitis.  Pharmacy has been consulted for vancomycin  and cefepime  dosing.  Tmax 101.2, WBC 11.3  Vancomycin  1000 mg IV x 1 given 5/28 @ 1740  Plan: Give additional vancomycin  1500 mg IV x 1 to complete loading dose of 2500 mg, then start 1250 mg IV every 24 hours Estimated AUC 532, Cmin 14 IBW, Scr 1.2, Vd 0.5 (BMI 43) Vancomycin  levels at steady state or as clinically indicated Start cefepime  2 grams IV every 12 hours Follow renal function and cultures for adjustments  Height: 5\' 5"  (165.1 cm) Weight: 117.9 kg (260 lb) IBW/kg (Calculated) : 61.5  Temp (24hrs), Avg:100 F (37.8 C), Min:98.7 F (37.1 C), Max:101.2 F (38.4 C)  Recent Labs  Lab 12/09/23 1637  WBC 11.3*  CREATININE 1.20  LATICACIDVEN 2.7*    Estimated Creatinine Clearance: 58.4 mL/min (by C-G formula based on SCr of 1.2 mg/dL).    Allergies  Allergen Reactions   Metformin Diarrhea    Loose stools even with XR    Antimicrobials this admission: cefepime  5/29 >>  Vancomycin  5/28 >>  Zosyn 3.375g IV x 1 5/28   Microbiology results: 5/28 BCx: pending   Thank you for allowing pharmacy to be a part of this patient's care.  Ramonita Burow, PharmD 12/09/2023 6:34 PM

## 2023-12-09 NOTE — H&P (Signed)
 History and Physical   Jonathan Cross. ZOX:[id number] DOB: 1946-02-29 DOA: 12/09/2023  PCP: Lucina Sabal, MD  Patient coming from: home via EMS  I have personally briefly reviewed patient's old medical records in [facility] EMR.  Chief Concern: Right foot pain and fever  HPI: Jonathan Cross, Jonathan Cross. is an 78 year old male with history of insulin -dependent diabetes mellitus, hypertension, GERD, hyperlipidemia, who presents emergency department for chief concerns of fever, cough, chills.  Vitals in the ED showed t of 101.2, respiration rate of 17, heart rate 107, blood pressure 152/75, SpO2 95% on room air.  Serum sodium is 133, potassium 3.7, chloride 100, bicarb 22, BUN of 16, serum creatinine 1.20, EGFR greater than 60, nonfasting blood glucose 204, WBC 11.3, hemoglobin 9.9, platelets of 136.  T. bili was elevated at 1.3.  COVID/influenza A/influenza B/RSV PCR were negative.  Lactic acidosis was 2.7.  ED treatment: Acetaminophen  1000 mg p.o. one-time dose, morphine  4 mg IV one-time dose, ondansetron  4 mg IV one-time dose, NS 1 L bolus, vancomycin  and Zosyn per pharmacy. ---------------------------------------- At bedside, patient was able to tell me his first and last name, age, location, current calendar year.  He reports he has been having right foot pain and tenderness with generalized malaise for about 3 days.  He reports yesterday he had a fever of 102 and he took Tylenol  to relieve the fever.  When he had another fever and the pain on his foot continued today, he decided to present to the emergency department for further evaluation.  He denies changes to baseline chest discomfort.  He denies shortness of breath, abdominal pain, dysuria, hematuria, blood in his stool.  He endorses loose bowel movement yesterday.  Social history: He lives at home on his own.  He denies tobacco, EtOH, recreational drug use.  He is retired and formally picked up  metals.  ROS: Constitutional: no weight change, + fever ENT/Mouth: no sore throat, no rhinorrhea Eyes: no eye pain, no vision changes Cardiovascular: no chest pain, no dyspnea,  no edema, no palpitations Respiratory: + cough, no sputum, no wheezing Gastrointestinal: no nausea, no vomiting, no diarrhea, no constipation Genitourinary: no urinary incontinence, no dysuria, no hematuria Musculoskeletal: no arthralgias, + myalgias Skin: no skin lesions, no pruritus, + right foot pain, redness, and warmth Neuro: + weakness, no loss of consciousness, no syncope Psych: no anxiety, no depression, + decrease appetite Heme/Lymph: no bruising, no bleeding  ED Course: Discussed with EDP, patient requiring hospitalization for concerns of right foot infection, osteomyelitis.  Assessment/Plan  Principal Problem:   Sepsis (HCC) Active Problems:   Uncontrolled type 2 diabetes mellitus with hyperglycemia, with long-term current use of insulin  (HCC)   Paroxysmal atrial fibrillation/A-flutter (HCC)   Status post coronary artery bypass graft   Benign prostatic hyperplasia   Essential hypertension   OSA (obstructive sleep apnea)   Type 2 diabetes mellitus without complication, with long-term current use of insulin  (HCC)   GERD (gastroesophageal reflux disease)   Atrial fibrillation (HCC)   Myelodysplastic syndrome (HCC)   Obesity, Class III, BMI 40-49.9 (morbid obesity)   Assessment and Plan:  * Sepsis (HCC) With concerns for osteomyelitis Patient has fever, increased respiration cardiac, leukocytosis, and lactic acid elevation Therefore on admission, sepsis cannot be excluded at this time Blood cultures x 2 are in process Continue vancomycin  and cefepime  MRI of the right foot wo contrast ordered on admission AM team to consult podiatry pending MRI result  Obesity, Class III, BMI 40-49.9 (morbid obesity)  This complicates overall care and prognosis.   Myelodysplastic syndrome (HCC) With  baseline chest pain that is unchanged  Type 2 diabetes mellitus without complication, with long-term current use of insulin  (HCC) Patient takes NPH 100 units twice daily Pharmacy consult placed to convert NPH 100 units twice daily to [place]  Insulin  SSI with at bedtime coverage, obese dosing ordered  OSA (obstructive sleep apnea) CPAP nightly  Essential hypertension Hydralazine  5 mg IV every 6 hours as needed for SBP greater 165, 5 days ordered  Chart reviewed.   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 04/2022 Echo: EF 60-65%, GrII DD; b. 06/2022 RHC: PA 50/20(30), PCWP 25; c. 11/2022 Echo: EF 60-65%, mild LVH, GrI DD. mildly reduced RV fxn, mild MR, nl fxn'ing bioprosthetic AoV; d. 01/2023 Echo: EF 60-65%, nl fxn'ing AoV, prob mild AS. Nl RV fxn.   Coronary artery disease    a. 01/2016 CABG x 2: LIMA->LAD, VG->OM; b. 05/2021 MV: small, mild, rev apical lateral and apical inf defects ->subtle ischemia vs artifact, EF 55-65%-->low risk; c. 06/2022 Cath: LM 20d, LAD 80ost, 60p/m, 47m, D1 mild dzs, LCX 50ost/p, OM1 mod dzs, RCA mild diff dzs, RPL1 75, VG->OM2 & LIMA->LAD patent--> Med Rx.   CVA, old, disturbances of vision    Diabetes (HCC)    H/O aortic valve replacement    a. 01/2016 s/p bioprosthetic AVR @ [facility]; b. 05/2021 Echo: EF 55-60%, nl fxn of bioprosthetic AVR; c. 04/2022 Echo: EF 60-65%, nl fxn'ing AoV; d. 11/2022 Echo: nl fxn'ing AoV; e. 01/2023 Echo: nl fxn'ing Aov, mean grad .   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 CHA2DS2VASc = 5-->eliquis [name]; b. 01/2023 Amio d/c'd due to concern re: lung toxicity/ongoing dyspnea.   Retinal vascular occlusion of left eye    Several years ago   Sleep apnea    BiPAP   Vertigo    Wears dentures    full upper and lower.  Lower "broken"   Past Surgical History:  Procedure Laterality Date   AORTIC VALVE REPLACEMENT  2017   [facility], bioprosthetic   APPENDECTOMY     BONE MARROW BIOPSY     05/2022   CARDIAC VALVE REPLACEMENT     CATARACT EXTRACTION W/PHACO Right 11/12/2022   Procedure: CATARACT EXTRACTION PHACO AND INTRAOCULAR LENS PLACEMENT (IOC) RIGHT DIABETIC MALYUGIN  13.49  01:06.1;  Surgeon: Annell Kidney, MD;  Location: [facility];  Service: Ophthalmology;  Laterality: Right;  Diabetic   CORONARY ARTERY BYPASS GRAFT  2017   [facility] - LIMA-LAD and SVG-OM   CYSTOSCOPY W/ RETROGRADES Right 05/27/2019   Procedure: CYSTOSCOPY WITH RETROGRADE PYELOGRAM;  Surgeon: Lawerence Pressman, MD;  Location: [facility];  Service: Urology;  Laterality: Right;   CYSTOSCOPY W/ RETROGRADES Right 07/11/2019   Procedure: CYSTOSCOPY WITH RETROGRADE PYELOGRAM;  Surgeon: Lawerence Pressman, MD;  Location: [facility];  Service: Urology;  Laterality:  Right;   CYSTOSCOPY/URETEROSCOPY/HOLMIUM LASER/STENT PLACEMENT Right 05/27/2019   Procedure: CYSTOSCOPY/URETEROSCOPY/HOLMIUM LASER/STENT PLACEMENT;  Surgeon: Lawerence Pressman, MD;  Location: [facility];  Service: Urology;  Laterality: Right;   CYSTOSCOPY/URETEROSCOPY/HOLMIUM LASER/STENT PLACEMENT Right 06/17/2019   Procedure: CYSTOSCOPY/URETEROSCOPY/HOLMIUM LASER/STENT Exchange;  Surgeon: Lawerence Pressman, MD;  Location: [facility];  Service: Urology;  Laterality: Right;   CYSTOSCOPY/URETEROSCOPY/HOLMIUM LASER/STENT PLACEMENT Right 07/11/2019   Procedure:  CYSTOSCOPY/URETEROSCOPY/HOLMIUM LASER/STENT Exchange;  Surgeon: Lawerence Pressman, MD;  Location: [facility];  Service: Urology;  Laterality: Right;   RIGHT HEART CATH AND CORONARY/GRAFT ANGIOGRAPHY Bilateral 06/17/2022   Procedure: RIGHT HEART CATH AND CORONARY/GRAFT ANGIOGRAPHY;  Surgeon: Sammy Crisp, MD;  Location: [facility];  Service: Cardiovascular;  Laterality: Bilateral;   STONE EXTRACTION WITH BASKET Right 07/11/2019   Procedure: STONE EXTRACTION WITH BASKET;  Surgeon: Lawerence Pressman, MD;  Location: [facility];  Service: Urology;  Laterality: Right;   TEE WITHOUT CARDIOVERSION N/A 04/21/2019   Procedure: TRANSESOPHAGEAL ECHOCARDIOGRAM (TEE);  Surgeon: Devorah Fonder, MD;  Location: [facility];  Service: Cardiovascular;  Laterality: N/A;   TEE WITHOUT CARDIOVERSION N/A 07/04/2019   Procedure: TRANSESOPHAGEAL ECHOCARDIOGRAM (TEE);  Surgeon: Wenona Hamilton, MD;  Location: [facility];  Service: Cardiovascular;  Laterality: N/A;   TEE WITHOUT CARDIOVERSION N/A 08/17/2019   Procedure: TRANSESOPHAGEAL ECHOCARDIOGRAM (TEE);  Surgeon: Constancia Delton, MD;  Location: [facility];  Service: Cardiovascular;  Laterality: N/A;   TEE WITHOUT CARDIOVERSION N/A 11/15/2019   Procedure: TRANSESOPHAGEAL ECHOCARDIOGRAM (TEE);  Surgeon: Sammy Crisp, MD;  Location: [facility];  Service: Cardiovascular;  Laterality: N/A;   TONSILLECTOMY     Social History:  reports that he quit smoking about 55 years ago. His smoking use included cigarettes. He has never used smokeless tobacco. He reports that he does not drink alcohol and does not use drugs.  Allergies  Allergen Reactions   Metformin Diarrhea    Loose stools even with XR   Family History  Problem Relation Age of Onset   Heart attack Father 22   Heart attack Sister    Family history: Family history reviewed and not pertinent.  Prior to Admission medications   Medication Sig Start Date End Date Taking? Authorizing Provider  aspirin  EC  81 MG tablet Take 81 mg by mouth daily. Swallow whole.    [provider]  atorvastatin  (LIPITOR ) 80 MG tablet Take 1 tablet (80 mg total) by mouth every evening. 11/01/21   Florette Hurry, NP  furosemide  (LASIX ) 40 MG tablet Take 1 tablet (40 mg total) by mouth 2 (two) times daily. Take 2 tablets or 80 mg in the morning and 40 mg in afternoon 08/25/23 08/24/24  Amin, Sumayya, MD  insulin  NPH-regular Human (70-30) 100 UNIT/ML injection Inject 100 Units into the skin 2 (two) times daily. 06/02/23   Donaciano Frizzle, MD  isosorbide  mononitrate (IMDUR ) 30 MG 24 hr tablet Take 0.5 tablets (15 mg total) by mouth daily. 12/14/22   Patel, Sona, MD  metoprolol  succinate (TOPROL -XL) 25 MG 24 hr tablet Take 1 tablet (25 mg total) by mouth 2 (two) times daily. 08/25/23   Amin, Sumayya, MD  nitroGLYCERIN  (NITROSTAT ) 0.4 MG SL tablet Place 1 tablet (0.4 mg total) under the tongue every 5 (five) minutes x 3 doses as needed for chest pain. 08/12/22   Tiajuana Fluke, MD  omeprazole  (PRILOSEC) 20 MG capsule Take 2 capsules by mouth once daily 07/02/23   End, Veryl Gottron, MD  predniSONE  (DELTASONE ) 20 MG tablet Take 20 mg by mouth  daily.    [provider]  ranolazine  (RANEXA ) 500 MG 12 hr tablet Take 1 tablet by mouth twice daily 05/05/23   End, Veryl Gottron, MD  tamsulosin  (FLOMAX ) 0.4 MG CAPS capsule Take 0.4 mg by mouth daily. 03/06/21   [provider]   Physical Exam: Vitals:   12/09/23 1700 12/09/23 1800 12/09/23 1830 12/09/23 1833  BP: (!) 152/75 (!) 103/55 (!) 113/55   Pulse: (!) 107 (!) 104 95 94  Resp: 17 (!) 21 18 14   Temp:    98.7 F (37.1 C)  TempSrc:    Oral  SpO2: 95% 96% 96% 96%  Weight:      Height:       Constitutional: appears age-appropriate, NAD, calm Eyes: PERRL, lids and conjunctivae normal ENMT: Mucous membranes are moist. Posterior pharynx clear of any exudate or lesions. Age-appropriate dentition. Hearing appropriate Neck: normal, supple, no masses,  no thyromegaly Respiratory: clear to auscultation bilaterally, no wheezing, no crackles. Normal respiratory effort. No accessory muscle use.  Cardiovascular: Regular rate and rhythm, no murmurs / rubs / gallops. No extremity edema. 2+ pedal pulses. No carotid bruits.  Abdomen: Obese abdomen, no tenderness, no masses palpated, no hepatosplenomegaly. Bowel sounds positive.  Musculoskeletal: no clubbing / cyanosis. No joint deformity upper and lower extremities. Good ROM, no contractures, no atrophy. Normal muscle tone.  Skin: Skin lesion concerning for osteomyelitis with increased warmth and redness       Neurologic: Sensation intact. Strength 5/5 in all 4.  Psychiatric: Normal judgment and insight. Alert and oriented x 3. Normal mood.   EKG: independently reviewed, showing atrial flutter with rate of 108, QTc 475  Chest x-ray on Admission: I personally reviewed and I agree with radiologist reading as below.  DG Foot Complete Right Result Date: 12/09/2023 CLINICAL DATA:  Foot wound EXAM: RIGHT FOOT COMPLETE - 3+ VIEW COMPARISON:  None Available. FINDINGS: No fracture or malalignment. Vascular calcifications. No soft tissue emphysema or osseous destructive change. Degenerative changes at the first MTP joint. Small plantar and posterior calcaneal spurs. No soft tissue emphysema IMPRESSION: No acute osseous abnormality. Electronically Signed   By: Esmeralda Hedge M.D.   On: 12/09/2023 17:46   DG Chest Port 1 View Result Date: 12/09/2023 CLINICAL DATA:  Suspected sepsis EXAM: PORTABLE CHEST 1 VIEW COMPARISON:  08/24/2023 FINDINGS: Sternotomy. No acute airspace disease or pleural effusion. Stable cardiomediastinal silhouette. No pneumothorax IMPRESSION: No active disease. Electronically Signed   By: Esmeralda Hedge M.D.   On: 12/09/2023 17:45   Labs on Admission: I have personally reviewed following labs  CBC: Recent Labs  Lab 12/09/23 1637  WBC 11.3*  NEUTROABS 7.9*  HGB 9.9*  HCT 31.3*  MCV  116.4*  PLT 136*   Basic Metabolic Panel: Recent Labs  Lab 12/09/23 1637  NA 133*  K 3.7  CL 100  CO2 22  GLUCOSE 204*  BUN 16  CREATININE 1.20  CALCIUM  9.0   GFR: Estimated Creatinine Clearance: 58.4 mL/min (by C-G formula based on SCr of 1.2 mg/dL).  Liver Function Tests: Recent Labs  Lab 12/09/23 1637  AST 20  ALT 16  ALKPHOS 42  BILITOT 1.3*  PROT 7.9  ALBUMIN 3.3*   Coagulation Profile: Recent Labs  Lab 12/09/23 1637  INR 1.2   Urine analysis:    Component Value Date/Time   COLORURINE YELLOW (A) 08/24/2023 1827   APPEARANCEUR CLEAR (A) 08/24/2023 1827   APPEARANCEUR Cloudy (A) 06/07/2019 1409   LABSPEC 1.020 08/24/2023 1827   PHURINE  5.0 08/24/2023 1827   GLUCOSEU 150 (A) 08/24/2023 1827   HGBUR NEGATIVE 08/24/2023 1827   BILIRUBINUR NEGATIVE 08/24/2023 1827   BILIRUBINUR Negative 06/07/2019 1409   KETONESUR NEGATIVE 08/24/2023 1827   PROTEINUR 30 (A) 08/24/2023 1827   NITRITE NEGATIVE 08/24/2023 1827   LEUKOCYTESUR NEGATIVE 08/24/2023 1827   CRITICAL CARE Performed by: Dr. Reinhold Carbine   Total critical care time: 32 minutes  Critical care time was exclusive of separately billable procedures and treating other patients.  Critical care was necessary to treat or prevent imminent or life-threatening deterioration.  Critical care was time spent personally by me on the following activities: development of treatment plan with patient and/ son, as well as nursing, discussions with consultants, evaluation of patient's response to treatment, examination of patient, obtaining history from patient or surrogate, ordering and performing treatments and interventions, ordering and review of laboratory studies, ordering and review of radiographic studies, pulse oximetry and re-evaluation of patient's condition.  This document was prepared using Dragon Voice Recognition software and may include unintentional dictation errors.  Dr. Reinhold Carbine [facility]  If 7PM-7AM,  please contact overnight-coverage provider If 7AM-7PM, please contact day attending provider [email or web address]  12/09/2023, 7:05 PM

## 2023-12-09 NOTE — Progress Notes (Signed)
 CODE SEPSIS - PHARMACY COMMUNICATION  **Broad Spectrum Antibiotics should be administered within 1 hour of Sepsis diagnosis**  Time Code Sepsis Called/Page Received: 1644  Antibiotics Ordered: vancomycin  and Zosyn  Time of 1st antibiotic administration: 1700  Additional action taken by pharmacy: None  If necessary, Name of Provider/Nurse Contacted: None    Ananias Balls ,PharmD Clinical Pharmacist  12/09/2023  4:46 PM

## 2023-12-09 NOTE — ED Provider Notes (Signed)
 [facility] Provider Note    Event Date/Time   First MD Initiated Contact with Patient 12/09/23 1620     (approximate)   History   Chief Complaint Fever   HPI  Jonathan Cross. is a 78 y.o. male with past medical history of hypertension, diabetes, CAD status post CABG, CHF, atrial fibrillation, IDA, and myelodysplastic syndrome who presents to the ED complaining of fever.  Patient reports that he has been feeling generally unwell for the past week with subjective fevers, chills, malaise, nausea, and vomiting.  During this time, he has had increasing pain and swelling to his right foot, particularly around his right great toe.  He does state that he deals with neuropathy and typically does not have much feeling in either foot.  He also endorses cough but has not had any chest pain or shortness of breath, denies abdominal pain or dysuria.     Physical Exam   Triage Vital Signs: ED Triage Vitals  Encounter Vitals Group     BP      Systolic BP Percentile      Diastolic BP Percentile      Pulse      Resp      Temp      Temp src      SpO2      Weight      Height      Head Circumference      Peak Flow      Pain Score      Pain Loc      Pain Education      Exclude from Growth Chart     Most recent vital signs: Vitals:   12/09/23 1700 12/09/23 1800  BP: (!) 152/75 (!) 103/55  Pulse: (!) 107 (!) 104  Resp: 17 (!) 21  Temp:    SpO2: 95% 96%    Constitutional: Alert and oriented. Eyes: Conjunctivae are normal. Head: Atraumatic. Nose: No congestion/rhinnorhea. Mouth/Throat: Mucous membranes are moist. Cardiovascular: Tachycardic, regular rhythm. Grossly normal heart sounds.  2+ radial and DP pulses bilaterally. Respiratory: Normal respiratory effort.  No retractions. Lungs CTAB. Gastrointestinal: Soft and nontender. No distention. Musculoskeletal: Erythema, edema, warmth, and tenderness noted of entire anterior portion of right foot,  especially around the great toe.  There is ulceration with purulence between the 1st and 2nd toes. Neurologic:  Normal speech and language. No gross focal neurologic deficits are appreciated.    ED Results / Procedures / Treatments   Labs (all labs ordered are listed, but only abnormal results are displayed) Labs Reviewed  COMPREHENSIVE METABOLIC PANEL WITH GFR - Abnormal; Notable for the following components:      Result Value   Sodium 133 (*)    Glucose, Bld 204 (*)    Albumin 3.3 (*)    Total Bilirubin 1.3 (*)    All other components within normal limits  LACTIC ACID, PLASMA - Abnormal; Notable for the following components:   Lactic Acid, Venous 2.7 (*)    All other components within normal limits  CBC WITH DIFFERENTIAL/PLATELET - Abnormal; Notable for the following components:   WBC 11.3 (*)    RBC 2.69 (*)    Hemoglobin 9.9 (*)    HCT 31.3 (*)    MCV 116.4 (*)    MCH 36.8 (*)    RDW 16.2 (*)    Platelets 136 (*)    Neutro Abs 7.9 (*)    Monocytes Absolute 1.1 (*)  Abs Immature Granulocytes 0.16 (*)    All other components within normal limits  PROTIME-INR - Abnormal; Notable for the following components:   Prothrombin Time 15.9 (*)    All other components within normal limits  RESP PANEL BY RT-PCR (RSV, FLU A&B, COVID)  [id number]  CULTURE, BLOOD (ROUTINE X 2)  CULTURE, BLOOD (ROUTINE X 2)  LACTIC ACID, PLASMA  URINALYSIS, W/ REFLEX TO CULTURE (INFECTION SUSPECTED)     EKG  ED ECG REPORT I, Twilla Galea, the attending physician, personally viewed and interpreted this ECG.   Date: 12/09/2023  EKG Time: 16:30  Rate: 108  Rhythm: sinus tachycardia  Axis: Normal  Intervals:none  ST&T Change: None  RADIOLOGY Chest x-ray reviewed and interpreted by me with no infiltrate, edema, or effusion.  PROCEDURES:  Critical Care performed: No  Procedures   MEDICATIONS ORDERED IN ED: Medications  vancomycin  (VANCOCIN ) IVPB 1000 mg/200 mL premix (1,000 mg  Intravenous New Bag/Given 12/09/23 1740)  piperacillin-tazobactam (ZOSYN) IVPB 3.375 g (0 g Intravenous Stopped 12/09/23 1740)  morphine  (PF) 4 MG/ML injection 4 mg (4 mg Intravenous Given 12/09/23 1657)  ondansetron  (ZOFRAN ) injection 4 mg (4 mg Intravenous Given 12/09/23 1657)  sodium chloride  0.9 % bolus 1,000 mL (1,000 mLs Intravenous New Bag/Given 12/09/23 1656)  acetaminophen  (TYLENOL ) tablet 1,000 mg (1,000 mg Oral Given 12/09/23 1705)     IMPRESSION / MDM / ASSESSMENT AND PLAN / ED COURSE  I reviewed the triage vital signs and the nursing notes.                              78 y.o. male with past medical history of hypertension, diabetes, CAD status post CABG, CHF, atrial fibrillation, IDA, and myelodysplastic syndrome who presents to the ED complaining of fevers and malaise for the past week along with increasing pain and swelling around his right foot.  Patient's presentation is most consistent with acute presentation with potential threat to life or bodily function.  Differential diagnosis includes, but is not limited to, sepsis, cellulitis, abscess, osteomyelitis, limb ischemia, pneumonia, UTI, electrolyte abnormality, AKI.  Patient ill-appearing but in no acute distress, vital signs remarkable for fever and tachycardia.  I am concerned for sepsis and we will initiate workup with blood cultures and lactic acid.  Source of sepsis appears to be his right foot, where there is obvious cellulitis and likely osteomyelitis.  Will further assess with x-ray for evidence of osteomyelitis, he does have a strong DP pulse on this side.  Lab results are pending at this time, will give IV fluid bolus in addition to broad-spectrum antibiotics.  Right foot x-ray is unremarkable with no signs of osteomyelitis, chest x-ray also negative for acute process.  Labs with mild leukocytosis and acute on chronic anemia, patient also with mildly elevated lactic acid and we will trend following IV fluids.  No  significant electrolyte abnormality or AKI noted, LFTs unremarkable.  Pain improved on reassessment, case discussed with hospitalist for admission.      FINAL CLINICAL IMPRESSION(S) / ED DIAGNOSES   Final diagnoses:  Sepsis without acute organ dysfunction, due to unspecified organism (HCC)  Cellulitis of right foot     Rx / DC Orders   ED Discharge Orders     None        Note:  This document was prepared using Dragon voice recognition software and may include unintentional dictation errors.   Twilla Galea, MD 12/09/23 [phone]

## 2023-12-09 NOTE — ED Notes (Signed)
 Pt placed on 2L [place] due to O2 saturation of 88% after morphine 

## 2023-12-09 NOTE — Assessment & Plan Note (Addendum)
 With concerns for osteomyelitis Patient has fever, increased respiration cardiac, leukocytosis, and lactic acid elevation Therefore on admission, sepsis cannot be excluded at this time Blood cultures x 2 are in process Continue vancomycin  and cefepime  MRI of the right foot wo contrast ordered on admission AM team to consult podiatry pending MRI result

## 2023-12-09 NOTE — Assessment & Plan Note (Signed)
 -  This complicates overall care and prognosis.

## 2023-12-09 NOTE — ED Notes (Signed)
 Increased O2 to 3L. MD aware.

## 2023-12-09 NOTE — Assessment & Plan Note (Signed)
 With baseline chest pain that is unchanged

## 2023-12-09 NOTE — Assessment & Plan Note (Signed)
 Hydralazine 5 mg IV every 6 hours as needed for SBP greater 165, 5 days ordered

## 2023-12-10 ENCOUNTER — Encounter: Payer: Self-pay | Admitting: Radiology

## 2023-12-10 ENCOUNTER — Inpatient Hospital Stay

## 2023-12-10 DIAGNOSIS — A419 Sepsis, unspecified organism: Secondary | ICD-10-CM | POA: Diagnosis not present

## 2023-12-10 DIAGNOSIS — I10 Essential (primary) hypertension: Secondary | ICD-10-CM

## 2023-12-10 DIAGNOSIS — R6520 Severe sepsis without septic shock: Secondary | ICD-10-CM | POA: Diagnosis not present

## 2023-12-10 LAB — CBC
HCT: 26.6 % — ABNORMAL LOW (ref 39.0–52.0)
Hemoglobin: 8.5 g/dL — ABNORMAL LOW (ref 13.0–17.0)
MCH: 36.5 pg — ABNORMAL HIGH (ref 26.0–34.0)
MCHC: 32 g/dL (ref 30.0–36.0)
MCV: 114.2 fL — ABNORMAL HIGH (ref 80.0–100.0)
Platelets: 119 10*3/uL — ABNORMAL LOW (ref 150–400)
RBC: 2.33 MIL/uL — ABNORMAL LOW (ref 4.22–5.81)
RDW: 16.5 % — ABNORMAL HIGH (ref 11.5–15.5)
WBC: 8.2 10*3/uL (ref 4.0–10.5)
nRBC: 0 % (ref 0.0–0.2)

## 2023-12-10 LAB — URINE CULTURE

## 2023-12-10 LAB — BASIC METABOLIC PANEL WITH GFR
Anion gap: 7 (ref 5–15)
BUN: 14 mg/dL (ref 8–23)
CO2: 23 mmol/L (ref 22–32)
Calcium: 8.2 mg/dL — ABNORMAL LOW (ref 8.9–10.3)
Chloride: 105 mmol/L (ref 98–111)
Creatinine, Ser: 1.08 mg/dL (ref 0.61–1.24)
GFR, Estimated: >60 mL/min (ref >60)
Glucose, Bld: 188 mg/dL — ABNORMAL HIGH (ref 70–99)
Potassium: 4.1 mmol/L (ref 3.5–5.1)
Sodium: 135 mmol/L (ref 135–145)

## 2023-12-10 LAB — GLUCOSE, CAPILLARY
Glucose-Capillary: 165 mg/dL — ABNORMAL HIGH (ref 70–99)
Glucose-Capillary: 156 mg/dL — ABNORMAL HIGH (ref 70–99)
Glucose-Capillary: 191 mg/dL — ABNORMAL HIGH (ref 70–99)
Glucose-Capillary: 202 mg/dL — ABNORMAL HIGH (ref 70–99)

## 2023-12-10 MED ORDER — SODIUM CHLORIDE 0.9 % IV SOLN
2.0000 g | INTRAVENOUS | Status: DC
  Administered 2023-12-11 – 2023-12-12 (×2): 2 g via INTRAVENOUS
  Filled 2023-12-10 (×3): qty 20

## 2023-12-10 MED ORDER — VANCOMYCIN HCL 1250 MG/250ML IV SOLN
1250.0000 mg | INTRAVENOUS | Status: DC
  Administered 2023-12-10 – 2023-12-12 (×3): 1250 mg via INTRAVENOUS
  Filled 2023-12-10 (×3): qty 250

## 2023-12-10 MED ORDER — ENOXAPARIN SODIUM 60 MG/0.6ML IJ SOSY
0.5000 mg/kg | PREFILLED_SYRINGE | INTRAMUSCULAR | Status: DC
  Administered 2023-12-10 – 2023-12-14 (×5): 60 mg via SUBCUTANEOUS
  Filled 2023-12-10 (×5): qty 0.6

## 2023-12-10 NOTE — Plan of Care (Signed)

## 2023-12-10 NOTE — Progress Notes (Addendum)
 Progress Note   Patient: Jonathan Cross. UJW:[id number] DOB: Jul 21, 1945 DOA: 12/09/2023     1 DOS: the patient was seen and examined on 12/10/2023   Brief hospital course:  "Mr. Eward, Rutigliano. is an 78 year old male with history of insulin -dependent diabetes mellitus, hypertension, GERD, hyperlipidemia, who presents emergency department for chief concerns of fever, cough, chills... right foot pain and general malaise x 3 days" See H&P for full HPI on admission & ED course.  Patient was admitted for further evaluation and management of sepsis due to right foot cellulitis and suspected UTI, started on empiric IV antibiotics pending culture and MRI of the right foot to assess for underlying osteomyelitis.  Further hospital course and management as outlined below.    Assessment and Plan:  *Severe Sepsis -- POA with tachypnea, tachycardia, leukocytosis, lactic acidosis consistent with organ dysfunction Right foot cellulitis - osteomyelitis ruled out.  Small abscess vs blister in 1st web space was drained by podiatry. UTI -- POA with UA large leukocytes, > 50 WBC's, rare bacteria --Podiatry consulted on admission --Continue IV Vancomycin  and Cefepime  per pharmacy --Follow blood cultures - negative to date --Follow urine culture --Follow right foot culture (sent by podiatry 5/29) --Right foot dressing change daily - per podiatry --MRI right foot negative for osteomyelitis - no need for podiatry consult at this time --Monitor fever curve, CBC, hemodynamics --Pain control, anti-pyretics PRN  Generalized weakness --PT/OT evaluations --Patient discharged home from SNF/rehab on May 4th per son --Fall precautions  Myelodysplastic syndrome  With baseline chest pain that is unchanged --Monitor  Type 2 diabetes mellitus without complication, with long-term current use of insulin  (HCC) Patient takes NPH 100 units twice daily --Semglee  50 units at bedtime --Sliding scale Novolog   AC/HS (resistant 0-20 units) --Titrate regimen for inpatient goal 140-180  OSA (obstructive sleep apnea) --CPAP nightly  Essential hypertension --Continue home Toprol -XL, Imdur  --PRN hydralazine   --Titrate regimen  Obesity, Class III, BMI 40-49.9  Body mass index is 43.27 kg/m. Complicates overall care and prognosis.  Recommend lifestyle modifications including physical activity and diet for weight loss and overall long-term health.      Subjective: Pt seen with son at bedside today. Pt continues to feel quite ill, fatigued, cold chills.  Poor appetite.  Right foot pain remains but better controlled with medication.     Physical Exam: Vitals:   12/10/23 0344 12/10/23 0757 12/10/23 1254 12/10/23 1306  BP: 115/64 (!) 120/52 133/66   Pulse: 98 94 (!) 110   Resp: 20 16    Temp: 99.1 F (37.3 C) 100.3 F (37.9 C)  99.2 F (37.3 C)  TempSrc: Oral   Oral  SpO2: 98% 96% 95%   Weight:      Height:       General exam: awake, appears drowsy, no acute distress, ill appearing, obese HEENT: atraumatic, clear conjunctiva, anicteric sclera, moist mucus membranes, hearing grossly normal  Respiratory system: CTAB generally diminished, no wheezes or rhonchi, normal respiratory effort, on room air. Cardiovascular system: normal S1/S2, RRR, no pedal edema.   Gastrointestinal system: soft, NT, ND, no HSM felt, +bowel sounds. Central nervous system: A&O x3. no gross focal neurologic deficits, normal speech Extremities: right foot erythema most severe at the right great toe with differential warmth on palpation and tenderness Skin: dry, intact, no rashes seen, right foot as above Psychiatry: normal mood, congruent affect, judgement and insight appear normal   Data Reviewed:  Notable labs and studies ---   Glucose 188, Ca  8.2 otherwise normal BMP Hbg 8.5 from 9.9 (suspect dilution from sepsis IV fluids) Platelets 119k WBC normalized 11.3 >> 8.2   Family Communication: son at bedside  on rounds  Disposition: Status is: Inpatient Remains inpatient appropriate because: remains on empiric IV antibiotics pending cultures   Planned Discharge Destination: Home with Home Health    Time spent: 45 minutes  Author: Montey Apa, DO 12/10/2023 1:12 PM  For on call review [email or web address].

## 2023-12-10 NOTE — Consult Note (Signed)
 ORTHOPAEDIC CONSULTATION  REQUESTING PHYSICIAN: Montey Apa, DO  Chief Complaint: Right foot redness swelling  HPI: Jonathan Cross. is a 78 y.o. male who complains of worsening redness and swelling to his right foot.  Had been seen in the outpatient clinic by myself with the redness and swelling to the foot.  Was stable but family noticed worsening redness and swelling over the last few days and it definitely progressed within the last couple of days.  Admitted to the hospital with cellulitis.  MRI was performed.  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 04/2022 Echo: EF 60-65%, GrII DD; b. 06/2022 RHC: PA 50/20(30), PCWP 25; c. 11/2022 Echo: EF 60-65%, mild LVH, GrI DD. mildly reduced RV fxn, mild MR, nl fxn'ing bioprosthetic AoV; d. 01/2023 Echo: EF 60-65%, nl fxn'ing AoV, prob mild AS. Nl RV fxn.   Coronary artery disease    a. 01/2016 CABG x 2: LIMA->LAD, VG->OM; b. 05/2021 MV: small, mild, rev apical lateral and apical inf defects ->subtle ischemia vs artifact, EF 55-65%-->low risk; c. 06/2022 Cath: LM 20d, LAD 80ost, 60p/m, 60m, D1 mild dzs, LCX 50ost/p, OM1 mod dzs, RCA mild diff dzs, RPL1 75, VG->OM2 & LIMA->LAD patent--> Med Rx.   CVA, old, disturbances of vision    Diabetes (HCC)    H/O aortic valve replacement    a. 01/2016 s/p bioprosthetic AVR @ [facility] for Severe AS; b. 05/2021 Echo: EF 55-60%, nl fxn of bioprosthetic AVR; c. 04/2022 Echo: EF 60-65%, nl fxn'ing AoV; d. 11/2022 Echo: nl fxn'ing AoV; e. 01/2023 Echo: nl fxn'ing Aov, mean grad .   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 CHA2DS2VASc = 5-->eliquis [name]; b. 01/2023 Amio d/c'd due to concern re: lung toxicity/ongoing  dyspnea.   Retinal vascular occlusion of left eye    Several years ago   Sleep apnea    BiPAP   Vertigo    Wears dentures    full upper and lower.  Lower "broken"   Past Surgical History:  Procedure Laterality Date   AORTIC VALVE REPLACEMENT  2017   [facility], bioprosthetic   APPENDECTOMY     BONE MARROW BIOPSY     05/2022   CARDIAC VALVE REPLACEMENT     CATARACT EXTRACTION W/PHACO Right 11/12/2022   Procedure: CATARACT EXTRACTION PHACO AND INTRAOCULAR LENS PLACEMENT (IOC) RIGHT DIABETIC MALYUGIN  13.49  01:06.1;  Surgeon: Annell Kidney, MD;  Location: [facility];  Service: Ophthalmology;  Laterality: Right;  Diabetic   CORONARY ARTERY BYPASS GRAFT  2017   [facility] - LIMA-LAD and SVG-OM   CYSTOSCOPY W/ RETROGRADES Right 05/27/2019   Procedure: CYSTOSCOPY WITH RETROGRADE PYELOGRAM;  Surgeon: Lawerence Pressman, MD;  Location: [facility];  Service: Urology;  Laterality: Right;   CYSTOSCOPY W/ RETROGRADES Right 07/11/2019   Procedure: CYSTOSCOPY WITH RETROGRADE PYELOGRAM;  Surgeon: Lawerence Pressman, MD;  Location: [facility];  Service: Urology;  Laterality: Right;   CYSTOSCOPY/URETEROSCOPY/HOLMIUM LASER/STENT PLACEMENT Right 05/27/2019   Procedure: CYSTOSCOPY/URETEROSCOPY/HOLMIUM LASER/STENT PLACEMENT;  Surgeon: Lawerence Pressman, MD;  Location: [facility];  Service: Urology;  Laterality: Right;   CYSTOSCOPY/URETEROSCOPY/HOLMIUM LASER/STENT PLACEMENT Right 06/17/2019   Procedure: CYSTOSCOPY/URETEROSCOPY/HOLMIUM LASER/STENT Exchange;  Surgeon: Estanislao Heimlich,  Dennard Fisher, MD;  Location: [facility];  Service: Urology;  Laterality: Right;   CYSTOSCOPY/URETEROSCOPY/HOLMIUM LASER/STENT PLACEMENT Right 07/11/2019   Procedure: CYSTOSCOPY/URETEROSCOPY/HOLMIUM LASER/STENT Exchange;  Surgeon: Lawerence Pressman, MD;  Location: [facility];  Service: Urology;  Laterality: Right;   RIGHT HEART CATH AND CORONARY/GRAFT ANGIOGRAPHY Bilateral 06/17/2022   Procedure: RIGHT HEART CATH AND CORONARY/GRAFT ANGIOGRAPHY;  Surgeon:  Sammy Crisp, MD;  Location: [facility];  Service: Cardiovascular;  Laterality: Bilateral;   STONE EXTRACTION WITH BASKET Right 07/11/2019   Procedure: STONE EXTRACTION WITH BASKET;  Surgeon: Lawerence Pressman, MD;  Location: [facility];  Service: Urology;  Laterality: Right;   TEE WITHOUT CARDIOVERSION N/A 04/21/2019   Procedure: TRANSESOPHAGEAL ECHOCARDIOGRAM (TEE);  Surgeon: Devorah Fonder, MD;  Location: [facility];  Service: Cardiovascular;  Laterality: N/A;   TEE WITHOUT CARDIOVERSION N/A 07/04/2019   Procedure: TRANSESOPHAGEAL ECHOCARDIOGRAM (TEE);  Surgeon: Wenona Hamilton, MD;  Location: [facility];  Service: Cardiovascular;  Laterality: N/A;   TEE WITHOUT CARDIOVERSION N/A 08/17/2019   Procedure: TRANSESOPHAGEAL ECHOCARDIOGRAM (TEE);  Surgeon: Constancia Delton, MD;  Location: [facility];  Service: Cardiovascular;  Laterality: N/A;   TEE WITHOUT CARDIOVERSION N/A 11/15/2019   Procedure: TRANSESOPHAGEAL ECHOCARDIOGRAM (TEE);  Surgeon: Sammy Crisp, MD;  Location: [facility];  Service: Cardiovascular;  Laterality: N/A;   TONSILLECTOMY     Social History   Socioeconomic History   Marital status: Widowed    Spouse name: Not on file   Number of children: Not on file   Years of education: Not on file   Highest education level: Not on file  Occupational History   Not on file  Tobacco Use   Smoking status: Former    Current packs/day: 0.00    Types: Cigarettes    Quit date: 1970    Years since quitting: 55.4   Smokeless tobacco: Never  Vaping Use   Vaping status: Never Used  Substance and Sexual Activity   Alcohol use: Never   Drug use: Never   Sexual activity: Not Currently  Other Topics Concern   Not on file  Social History Narrative   Not on file   Social Drivers of Health   Financial Resource Strain: Low Risk  (11/03/2023)   Received from [facility]   Overall Financial Resource Strain (CARDIA)    Difficulty of Paying Living Expenses:  Not hard at all  Food Insecurity: No Food Insecurity (12/09/2023)   Hunger Vital Sign    Worried About Running Out of Food in the Last Year: Never true    Ran Out of Food in the Last Year: Never true  Transportation Needs: No Transportation Needs (12/09/2023)   PRAPARE - Administrator, Civil Service (Medical): No    Lack of Transportation (Non-Medical): No  Physical Activity: Inactive (04/24/2021)   Received from [facility]   Exercise Vital Sign    Days of Exercise per Week: 0 days    Minutes of Exercise per Session: 0 min  Stress: No Stress Concern Present (04/24/2021)   Received from [facility] of Occupational Health - Occupational Stress Questionnaire    Feeling of Stress : Not at all  Social Connections: Socially Isolated (12/09/2023)   Social Connection and Isolation Panel [NHANES]    Frequency of Communication with Friends and Family: Three times a week    Frequency of Social Gatherings with Friends and Family: Twice a week  Attends Religious Services: Never    Active Member of Clubs or Organizations: No    Attends Banker Meetings: Never    Marital Status: Widowed   Family History  Problem Relation Age of Onset   Heart attack Father 26   Heart attack Sister    Allergies  Allergen Reactions   Metformin Diarrhea    Loose stools even with XR   Prior to Admission medications   Medication Sig Start Date End Date Taking? Authorizing Provider  aspirin  EC 81 MG tablet Take 81 mg by mouth daily. Swallow whole.    [provider]  atorvastatin  (LIPITOR ) 80 MG tablet Take 1 tablet (80 mg total) by mouth every evening. 11/01/21   Florette Hurry, NP  furosemide  (LASIX ) 40 MG tablet Take 1 tablet (40 mg total) by mouth 2 (two) times daily. Take 2 tablets or 80 mg in the morning and 40 mg in afternoon 08/25/23 08/24/24  Amin, Sumayya, MD  insulin  NPH-regular Human (70-30) 100  UNIT/ML injection Inject 100 Units into the skin 2 (two) times daily. 06/02/23   Donaciano Frizzle, MD  isosorbide  mononitrate (IMDUR ) 30 MG 24 hr tablet Take 0.5 tablets (15 mg total) by mouth daily. 12/14/22   Patel, Sona, MD  metoprolol  succinate (TOPROL -XL) 25 MG 24 hr tablet Take 1 tablet (25 mg total) by mouth 2 (two) times daily. 08/25/23   Luna Salinas, MD  nitroGLYCERIN  (NITROSTAT ) 0.4 MG SL tablet Place 1 tablet (0.4 mg total) under the tongue every 5 (five) minutes x 3 doses as needed for chest pain. 08/12/22   Tiajuana Fluke, MD  omeprazole  (PRILOSEC) 20 MG capsule Take 2 capsules by mouth once daily 07/02/23   End, Veryl Gottron, MD  ranolazine  (RANEXA ) 500 MG 12 hr tablet Take 1 tablet by mouth twice daily 05/05/23   End, Veryl Gottron, MD  tamsulosin  (FLOMAX ) 0.4 MG CAPS capsule Take 0.4 mg by mouth daily. 03/06/21   [provider]   MR FOOT RIGHT WO CONTRAST Result Date: 12/10/2023 CLINICAL DATA:  Soft tissue infection suspected, foot, xray done EXAM: MRI OF THE RIGHT FOREFOOT WITHOUT CONTRAST TECHNIQUE: Multiplanar, multisequence MR imaging of the right foot was performed. No intravenous contrast was administered. COMPARISON:  Right foot radiographs dated 12/09/2023. FINDINGS: Bones/Joint/Cartilage No marrow signal abnormality. No fracture or dislocation. Normal alignment. No joint effusion. Moderate first MTP joint space narrowing and marginal osteophytosis. Mild joint space narrowing of second through fifth MTP joints. Diffuse interphalangeal joint space narrowing. Mild degenerative changes of the TMT joints. Ligaments Collateral ligaments are intact.  Lisfranc ligament is intact. Muscles and Tendons Flexor and extensor compartment tendons are intact. No tenosynovitis. Severe fatty atrophy of the intrinsic musculature of the foot, may reflect chronic denervation changes. Soft tissue An 8 x 3 mm cutaneous T2 hyperintense focus in the first webspace, at the lateral base of the great toe,  could represent a blister. There is underlying soft tissue edema of the great toe with cutaneous thickening. Nonspecific subcutaneous edema extending along the dorsal foot. No loculated subcutaneous soft tissue fluid collection identified. IMPRESSION: 1. An 8 x 3 mm fluid signal focus in the first webspace, at the lateral base of the great toe, may represent a blister. Underlying soft tissue edema of the great toe with cutaneous thickening is concerning for cellulitis. 2. No marrow signal abnormality identified to suggest osteomyelitis. 3. Mild-to-moderate degenerative changes of the foot, most pronounced at the first MTP joint. 4. Severe fatty atrophy of the intrinsic musculature  of the foot, may reflect chronic denervation changes. Electronically Signed   By: Mannie Seek M.D.   On: 12/10/2023 11:40   DG Foot Complete Right Result Date: 12/09/2023 CLINICAL DATA:  Foot wound EXAM: RIGHT FOOT COMPLETE - 3+ VIEW COMPARISON:  None Available. FINDINGS: No fracture or malalignment. Vascular calcifications. No soft tissue emphysema or osseous destructive change. Degenerative changes at the first MTP joint. Small plantar and posterior calcaneal spurs. No soft tissue emphysema IMPRESSION: No acute osseous abnormality. Electronically Signed   By: Esmeralda Hedge M.D.   On: 12/09/2023 17:46   DG Chest Port 1 View Result Date: 12/09/2023 CLINICAL DATA:  Suspected sepsis EXAM: PORTABLE CHEST 1 VIEW COMPARISON:  08/24/2023 FINDINGS: Sternotomy. No acute airspace disease or pleural effusion. Stable cardiomediastinal silhouette. No pneumothorax IMPRESSION: No active disease. Electronically Signed   By: Esmeralda Hedge M.D.   On: 12/09/2023 17:45    Positive ROS: All other systems have been reviewed and were otherwise negative with the exception of those mentioned in the HPI and as above.  12 point ROS was performed.  Physical Exam: General: Alert and oriented.  No apparent distress.  Vascular:  Left foot:Not  evaluated today  Right foot: Nonpalpable pulses mainly secondary to edema.  Neuro: Gross sensation is intact webspace of the right foot.  Mild diffuse erythema to the mid tarsal region.  No ulcers at this time.  No active draining wounds  Derm: There is a superficial fluid-filled blister on the  Ortho/MS: Diffuse edema to the lower extremities right is worse than left.  Right forefoot markedly edematous.  I personally reviewed the MRI that was consistent with fluid fluid blister to the right first webspace.  No drainable abscess or osteomyelitis.  Assessment: Cellulitis right foot  Plan: There was a blister noted to the right first webspace.  I drained the blister and was able to perform a culture of the blister fluid.  Will send this off for routine culture.  No drainable abscess or need for surgical I&D from podiatry standpoint at this point.  Continue with antibiotics.  Can await the culture results.  Dressing applied.  Can keep the dressing intact change daily as needed.    Brant Caldron, DPM Cell [phone]   12/10/2023 12:29 PM

## 2023-12-11 DIAGNOSIS — D469 Myelodysplastic syndrome, unspecified: Secondary | ICD-10-CM

## 2023-12-11 DIAGNOSIS — R6520 Severe sepsis without septic shock: Secondary | ICD-10-CM | POA: Diagnosis not present

## 2023-12-11 DIAGNOSIS — E1165 Type 2 diabetes mellitus with hyperglycemia: Secondary | ICD-10-CM

## 2023-12-11 DIAGNOSIS — Z794 Long term (current) use of insulin: Secondary | ICD-10-CM

## 2023-12-11 DIAGNOSIS — A419 Sepsis, unspecified organism: Secondary | ICD-10-CM | POA: Diagnosis not present

## 2023-12-11 LAB — CBC
HCT: 25.6 % — ABNORMAL LOW (ref 39.0–52.0)
Hemoglobin: 8.1 g/dL — ABNORMAL LOW (ref 13.0–17.0)
MCH: 36 pg — ABNORMAL HIGH (ref 26.0–34.0)
MCHC: 31.6 g/dL (ref 30.0–36.0)
MCV: 113.8 fL — ABNORMAL HIGH (ref 80.0–100.0)
Platelets: 116 10*3/uL — ABNORMAL LOW (ref 150–400)
RBC: 2.25 MIL/uL — ABNORMAL LOW (ref 4.22–5.81)
RDW: 16.3 % — ABNORMAL HIGH (ref 11.5–15.5)
WBC: 6.2 10*3/uL (ref 4.0–10.5)
nRBC: 0 % (ref 0.0–0.2)

## 2023-12-11 LAB — BLOOD GAS, VENOUS
Acid-base deficit: 0.6 mmol/L (ref 0.0–2.0)
Bicarbonate: 24.9 mmol/L (ref 20.0–28.0)
O2 Saturation: 76.1 %
Patient temperature: 37
pCO2, Ven: 43 mmHg — ABNORMAL LOW (ref 44–60)
pH, Ven: 7.37 (ref 7.25–7.43)
pO2, Ven: 48 mmHg — ABNORMAL HIGH (ref 32–45)

## 2023-12-11 LAB — GLUCOSE, CAPILLARY
Glucose-Capillary: 144 mg/dL — ABNORMAL HIGH (ref 70–99)
Glucose-Capillary: 137 mg/dL — ABNORMAL HIGH (ref 70–99)
Glucose-Capillary: 167 mg/dL — ABNORMAL HIGH (ref 70–99)
Glucose-Capillary: 201 mg/dL — ABNORMAL HIGH (ref 70–99)

## 2023-12-11 LAB — HEMOGLOBIN A1C
Hgb A1c MFr Bld: 6.9 % — ABNORMAL HIGH (ref 4.8–5.6)
Mean Plasma Glucose: 151.33 mg/dL

## 2023-12-11 MED ORDER — OXYCODONE HCL 5 MG PO TABS: 5.0000 mg | ORAL_TABLET | Freq: Four times a day (QID) | ORAL | Status: DC | PRN

## 2023-12-11 MED ORDER — ACETAMINOPHEN 500 MG PO TABS
1000.0000 mg | ORAL_TABLET | Freq: Three times a day (TID) | ORAL | Status: DC
  Administered 2023-12-11 – 2023-12-15 (×13): 1000 mg via ORAL
  Filled 2023-12-11 (×13): qty 2

## 2023-12-11 MED ORDER — TRAMADOL HCL 50 MG PO TABS
50.0000 mg | ORAL_TABLET | Freq: Four times a day (QID) | ORAL | Status: DC | PRN
  Administered 2023-12-12: 50 mg via ORAL
  Filled 2023-12-11: qty 1

## 2023-12-11 NOTE — Plan of Care (Signed)
 Received patient at 3 pm, patient alert and oriented x4. Patient voiced no concerns.  Tolerated dinner tray well.  Problem: Education: Goal: Ability to describe self-care measures that may prevent or decrease complications (Diabetes Survival Skills Education) will improve Outcome: Progressing Goal: Individualized Educational Video(s) Outcome: Progressing   Problem: Nutritional: Goal: Maintenance of adequate nutrition will improve Outcome: Progressing Goal: Progress toward achieving an optimal weight will improve Outcome: Progressing   Problem: Skin Integrity: Goal: Risk for impaired skin integrity will decrease Outcome: Progressing   Problem: Clinical Measurements: Goal: Diagnostic test results will improve Outcome: Progressing Goal: Signs and symptoms of infection will decrease Outcome: Progressing

## 2023-12-11 NOTE — Progress Notes (Signed)
 Progress Note   Patient: Jonathan Cross. WUJ:[id number] DOB: 12-31-1945 DOA: 12/09/2023     2 DOS: the patient was seen and examined on 12/11/2023   Brief hospital course:  "[name], [name] is an 78 year old male with history of insulin -dependent diabetes mellitus, hypertension, GERD, hyperlipidemia, who presents emergency department for chief concerns of fever, cough, chills... right foot pain and general malaise x 3 days" See H&P for full HPI on admission & ED course.  Patient was admitted for further evaluation and management of sepsis due to right foot cellulitis and suspected UTI, started on empiric IV antibiotics pending culture and MRI of the right foot to assess for underlying osteomyelitis.  Further hospital course and management as outlined below.    Assessment and Plan:  *Severe Sepsis -- POA with tachypnea, tachycardia, leukocytosis, lactic acidosis consistent with organ dysfunction Right foot cellulitis - osteomyelitis ruled out.  Small abscess vs blister in 1st web space was drained by podiatry 5/29. Pyuria, Bacteruria -- POA with UA large leukocytes, > 50 WBC's, rare bacteria.  Urine culture - No Growth.  UTI ruled out. 5/30 -- recurrent fever 100.5 F this AM --Podiatry consulted on admission --Continue IV Vancomycin  and Cefepime  per pharmacy --Follow blood cultures - negative to date --Follow right foot culture (sent by podiatry 5/29) --Right foot dressing change daily - per podiatry --MRI right foot negative for osteomyelitis - no need for podiatry consult at this time --Monitor fever curve, CBC, hemodynamics --Pain control, anti-pyretics PRN  Generalized weakness --PT/OT evaluations --Patient discharged home from SNF/rehab on May 4th per son --Fall precautions  Macrocytic anemia -- Hbg 9.9 on admission, pt got sepsis IV fluids. Hbg trend 9.9 >> 8.5 >> 8.1.  No evidence of bleeding Suspect dilution with IV fluids explains drop in Hbg since  admission. 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 Platelets 136 >> 119 >> 116k --Monitor CBC --Low threshold to hold Lovenox  if plt's continue to trend down  Myelodysplastic syndrome  With baseline chest pain that is unchanged --Monitor  Type 2 diabetes mellitus without complication, with long-term current use of insulin  (HCC) Patient takes NPH 100 units twice daily --Semglee  50 units at bedtime --Sliding scale Novolog  AC/HS (resistant 0-20 units) --Titrate regimen for inpatient goal 140-180  OSA (obstructive sleep apnea) --CPAP nightly  Essential hypertension --Continue home Toprol -XL, Imdur  --PRN hydralazine   --Titrate regimen  Obesity, Class III, BMI 40-49.9  Body mass index is 43.27 kg/m. Complicates overall care and prognosis.  Recommend lifestyle modifications including physical activity and diet for weight loss and overall long-term health.      Subjective: Pt seen with son at bedside today.  Pt continues to have significant foot pain.  He reports some chills on and off including this AM.  Son expresses concern morphine  causing sedation and confusion.  Pt denies other acute complaints.    Physical Exam: Vitals:   12/10/23 2024 12/10/23 2351 12/11/23 0346 12/11/23 0741  BP: (!) 124/55 122/60 (!) 152/66 126/64  Pulse: 89 86 84 92  Resp: 18 18 18 20   Temp: 98 F (36.7 C) 98 F (36.7 C) 98 F (36.7 C) (!) 100.5 F (38.1 C)  TempSrc: Oral Oral Oral Oral  SpO2: 99% 98% 97% 93%  Weight:      Height:       General exam: awake, drowsy, no acute distress, ill appearing, obese HEENT: moist mucus membranes, hearing grossly normal  Respiratory system: CTAB generally diminished, no wheezes or  rhonchi, normal respiratory effort, on room air. Cardiovascular system: normal S1/S2, RRR, no pedal edema.   Gastrointestinal system: soft, NT, ND, no HSM felt, +bowel sounds. Central nervous system: A&O x3. no gross focal neurologic deficits, normal  speech Extremities: right foot guaze dressing in place with visible great toe tensely erythematous similar to yesterday Skin: dry, intact, no rashes seen, right foot as above Psychiatry: normal mood, congruent affect, judgement and insight appear normal   Data Reviewed:  Notable labs and studies ---   Hbg 8.5 >> 8.1 Platelets 119 >> 116k WBC normalized 11.3 >> 8.2 >> 6.2  VBG pH 7.37, pCO2 43 not hypercarbic   Family Communication: son at bedside on rounds 5/29, 5/30  Disposition: Status is: Inpatient Remains inpatient appropriate because: recurrent fever this AM, remains on empiric IV antibiotics pending cultures and further clinical improvement    Planned Discharge Destination: SNF     Time spent: 45 minutes  Author: Montey Apa, DO 12/11/2023 1:14 PM  For on call review [email or web address].

## 2023-12-11 NOTE — Evaluation (Signed)
 Occupational Therapy Evaluation Patient Details Name: Jonathan Cross. MRN: [id number] DOB: 1945/09/22 Today's Date: 12/11/2023   History of Present Illness   Jonathan Cross. is a 78 y.o. male that presented to ED with R foot pain/swelling, workup for cellulitis. with medical history significant of CAD status post CABG, severe aortic stenosis status post bioprosthetic AVR complicated by endocarditis, PAF not on Eliquis  due to severe rectal bleeding, chronic HFpEF, HTN, IIDM, MDS chronic normocytic anemia, obesity, OSA     Clinical Impressions Pt was seen for OT evaluation this date with overlapping cotx with PT to optimize safety for mobility. Prior to hospital admission, pt was home for ~2wks after recent rehab stay. Per son, pt lives alone but since rehab he's had family checking on him daily and has not really been alone. Son reports that pt did well at rehab and was walking with a RW well upon return home. Family assists with IADL and pt generally was able to complete basic ADL himself. Son also notes that pt's daughter is making plans to move in with the pt in the next few weeks. Pt presents to acute OT demonstrating impaired ADL performance and functional mobility 2/2 lethargy, SpO2 86-96% on 2L, pt stating he does not feel well, intermittently confused, weak, and demonstrating impaired balance and activity tolerance (See OT problem list for additional functional deficits). Son notes CPAP use at home. Encouraged son to bring machine. RN and MD notified. Pt currently requires MIN +2 for STS and step pivot to the bed with RW, cues for RW use. MOD A for donning sock on R foot. Pt would benefit from skilled OT services to address noted impairments and functional limitations (see below for any additional details) in order to maximize safety and independence while minimizing falls risk and caregiver burden.     If plan is discharge home, recommend the following:   A little help with  walking and/or transfers;A lot of help with bathing/dressing/bathroom;Assistance with cooking/housework;Assist for transportation;Direct supervision/assist for financial management;Direct supervision/assist for medications management;Supervision due to cognitive status;Help with stairs or ramp for entrance     Functional Status Assessment   Patient has had a recent decline in their functional status and demonstrates the ability to make significant improvements in function in a reasonable and predictable amount of time.     Equipment Recommendations   Other (comment) (defer)     Recommendations for Other Services         Precautions/Restrictions   Precautions Precautions: Fall Recall of Precautions/Restrictions: Impaired Restrictions Weight Bearing Restrictions Per Provider Order: No     Mobility Bed Mobility                    Transfers Overall transfer level: Needs assistance Equipment used: Rolling walker (2 wheels) Transfers: Sit to/from Stand Sit to Stand: Min assist, +2 physical assistance                  Balance Overall balance assessment: Needs assistance Sitting-balance support: No upper extremity supported, Feet supported Sitting balance-Leahy Scale: Fair     Standing balance support: Bilateral upper extremity supported, Reliant on assistive device for balance Standing balance-Leahy Scale: Poor                             ADL either performed or assessed with clinical judgement   ADL Overall ADL's : Needs assistance/impaired     Grooming: Sitting;Set up;Wash/dry  face;Supervision/safety               Lower Body Dressing: Sitting/lateral leans;Moderate assistance Lower Body Dressing Details (indicate cue type and reason): don sock on R foot             Functional mobility during ADLs: Minimal assistance;+2 for physical assistance;Cueing for sequencing;Rolling walker (2 wheels)       Vision          Perception         Praxis         Pertinent Vitals/Pain Pain Assessment Pain Assessment: Faces Faces Pain Scale: Hurts a little bit Pain Location: R foot Pain Descriptors / Indicators: Grimacing Pain Intervention(s): Limited activity within patient's tolerance, Monitored during session, Repositioned     Extremity/Trunk Assessment Upper Extremity Assessment Upper Extremity Assessment: Generalized weakness (questionable given lethargy)   Lower Extremity Assessment Lower Extremity Assessment: Defer to PT evaluation;Generalized weakness;RLE deficits/detail (questionable given lethargy) RLE Deficits / Details: R foot wounds, bandaged       Communication Communication Communication: Impaired Factors Affecting Communication: Reduced clarity of speech   Cognition Arousal: Lethargic Behavior During Therapy: Flat affect Cognition: Difficult to assess Difficult to assess due to: Level of arousal           OT - Cognition Comments: Pt able to tell OT he is at the hospital but later states "I don't know where I am", cognition waxing/waning during session                 Following commands: Impaired Following commands impaired: Follows one step commands with increased time, Follows multi-step commands inconsistently     Cueing  General Comments   Cueing Techniques: Verbal cues;Gestural cues;Tactile cues  SpO2 86-96% on 2L, confused, lethargic, RN and MD notified   Exercises     Shoulder Instructions      Home Living Family/patient expects to be discharged to:: Private residence Living Arrangements: Alone Available Help at Discharge: Family;Available 24 hours/day;Available PRN/intermittently (since coming home ~2 wks ago from rehab, pt has essentially had 24/7 assist from family per son) Type of Home: House Home Access: Ramped entrance     Home Layout: One level     Bathroom Shower/Tub: Chief Strategy Officer: Standard Bathroom Accessibility:  No   Home Equipment: Grab bars - tub/shower;Rolling Walker (2 wheels);Shower seat;Cane - single point;Rollator (4 wheels)   Additional Comments: Son reports that pt's dtr is planning to move in with pt in the next few weeks      Prior Functioning/Environment Prior Level of Function : Needs assist;History of Falls (last six months)             Mobility Comments: Pt ambulates with RW ADLs Comments: Pt reports indep with basic ADL and son/family assist with all IADL, wears CPAP at night and 2L during the day "sometimes"    OT Problem List: Decreased strength;Cardiopulmonary status limiting activity;Decreased cognition;Decreased activity tolerance;Impaired balance (sitting and/or standing);Decreased knowledge of use of DME or AE   OT Treatment/Interventions: Self-care/ADL training;Therapeutic exercise;Therapeutic activities;Cognitive remediation/compensation;DME and/or AE instruction;Patient/family education;Balance training;Energy conservation      OT Goals(Current goals can be found in the care plan section)   Acute Rehab OT Goals Patient Stated Goal: feel better OT Goal Formulation: With patient/family Time For Goal Achievement: 12/25/23 Potential to Achieve Goals: Good ADL Goals Pt Will Perform Lower Body Dressing: sit to/from stand;with contact guard assist Pt Will Transfer to Toilet: bedside commode;ambulating;with contact guard assist (LRAD) Pt  Will Perform Toileting - Clothing Manipulation and hygiene: with modified independence;sitting/lateral leans Additional ADL Goal #1: Pt will complete all aspects of a seated shower with supv for safety, 1/1 opportunity.   OT Frequency:  Min 2X/week    Co-evaluation PT/OT/SLP Co-Evaluation/Treatment: Yes Reason for Co-Treatment: Necessary to address cognition/behavior during functional activity;For patient/therapist safety;To address functional/ADL transfers PT goals addressed during session: Mobility/safety with  mobility;Balance;Proper use of DME OT goals addressed during session: ADL's and self-care;Proper use of Adaptive equipment and DME      AM-PAC OT "6 Clicks" Daily Activity     Outcome Measure Help from another person eating meals?: None Help from another person taking care of personal grooming?: A Little Help from another person toileting, which includes using toliet, bedpan, or urinal?: A Lot Help from another person bathing (including washing, rinsing, drying)?: A Lot Help from another person to put on and taking off regular upper body clothing?: A Little Help from another person to put on and taking off regular lower body clothing?: A Lot 6 Click Score: 16   End of Session Equipment Utilized During Treatment: Oxygen;Rolling walker (2 wheels) Nurse Communication: Mobility status;Other (comment) (lethargy, SpO2)  Activity Tolerance: Patient limited by lethargy Patient left: in bed;with call bell/phone within reach;with bed alarm set (BLE elevated, 2L O2 on)  OT Visit Diagnosis: Other abnormalities of gait and mobility (R26.89);Muscle weakness (generalized) (M62.81)                Time: 1610-9604 OT Time Calculation (min): 22 min Charges:  OT General Charges $OT Visit: 1 Visit OT Evaluation $OT Eval Moderate Complexity: 1 Mod  Berenda Breaker., MPH, MS, OTR/L ascom [phone] 12/11/23, 9:54 AM

## 2023-12-11 NOTE — Evaluation (Signed)
 Physical Therapy Evaluation Patient Details Name: Jonathan Cross. MRN: [id number] DOB: 1945/09/15 Today's Date: 12/11/2023  History of Present Illness  Jonathan Cross. is a 78 y.o. male that presented to ED with R foot pain/swelling, workup for cellulitis. with medical history significant of CAD status post CABG, severe aortic stenosis status post bioprosthetic AVR complicated by endocarditis, PAF not on Eliquis  due to severe rectal bleeding, chronic HFpEF, HTN, IIDM, MDS chronic normocytic anemia, obesity, OSA.   Clinical Impression  Patient lethargic, OT at bedside. Overlap for mobility due to patient lethargy, RN/MD notified of pt status. Family at bedside confirmed pt is normally ambulatory with RW, family plans on setting up some more assistance. Pt reported indep with basic ADL and son/family assist with all IADL. He was able to stand from recliner minAx2, RW. True ambulation deferred due to unsteadiness and lethargy, but able to take 3-4 steps to return to EOB of bed, minAx2. Sit to supine with minA as well.  Overall the patient demonstrated deficits (see "PT Problem List") that impede the patient's functional abilities, safety, and mobility and would benefit from skilled PT intervention.        If plan is discharge home, recommend the following: A lot of help with walking and/or transfers;A lot of help with bathing/dressing/bathroom;Assist for transportation;Assistance with cooking/housework;Help with stairs or ramp for entrance   Can travel by private vehicle   No    Equipment Recommendations Other (comment) (TBD)  Recommendations for Other Services       Functional Status Assessment Patient has had a recent decline in their functional status and demonstrates the ability to make significant improvements in function in a reasonable and predictable amount of time.     Precautions / Restrictions Precautions Precautions: Fall Recall of Precautions/Restrictions:  Impaired Restrictions Weight Bearing Restrictions Per Provider Order: No      Mobility  Bed Mobility Overal bed mobility: Needs Assistance Bed Mobility: Sit to Supine       Sit to supine: Min assist, Used rails   General bed mobility comments: extra time, verbal cues for technique    Transfers Overall transfer level: Needs assistance Equipment used: Rolling walker (2 wheels) Transfers: Sit to/from Stand Sit to Stand: Min assist, +2 physical assistance           General transfer comment: steadying assist    Ambulation/Gait               General Gait Details: unable to truly ambulate due to lethargy. 3-4 steps towards bed with minAx2 with RW  Stairs            Wheelchair Mobility     Tilt Bed    Modified Rankin (Stroke Patients Only)       Balance Overall balance assessment: Needs assistance Sitting-balance support: No upper extremity supported, Feet supported Sitting balance-Leahy Scale: Fair     Standing balance support: Bilateral upper extremity supported, Reliant on assistive device for balance Standing balance-Leahy Scale: Poor                               Pertinent Vitals/Pain Pain Assessment Pain Assessment: Faces Faces Pain Scale: Hurts a little bit Pain Location: R foot Pain Descriptors / Indicators: Grimacing, Moaning Pain Intervention(s): Limited activity within patient's tolerance, Monitored during session, Repositioned    Home Living Family/patient expects to be discharged to:: Private residence Living Arrangements: Alone Available Help at Discharge: Family;Available 24  hours/day;Available PRN/intermittently (since coming home ~2 wks ago from rehab, pt has essentially had 24/7 assist from family per son) Type of Home: House Home Access: Ramped entrance       Home Layout: One level Home Equipment: Grab bars - tub/shower;Rolling Walker (2 wheels);Shower seat;Cane - single point;Rollator (4 wheels) Additional  Comments: Son reports that pt's dtr is planning to move in with pt in the next few weeks    Prior Function Prior Level of Function : Needs assist;History of Falls (last six months)             Mobility Comments: Pt ambulates with RW ADLs Comments: Pt reports indep with basic ADL and son/family assist with all IADL, wears CPAP at night and 2L during the day "sometimes"     Extremity/Trunk Assessment   Upper Extremity Assessment Upper Extremity Assessment: Defer to OT evaluation    Lower Extremity Assessment Lower Extremity Assessment: Generalized weakness (able to lift BLE against gravity in supine) RLE Deficits / Details: R foot wounds, bandaged       Communication   Communication Communication: Impaired Factors Affecting Communication: Reduced clarity of speech    Cognition Arousal: Lethargic Behavior During Therapy: Flat affect                             Following commands: Impaired Following commands impaired: Follows one step commands with increased time, Follows multi-step commands inconsistently     Cueing Cueing Techniques: Verbal cues, Gestural cues, Tactile cues     General Comments General comments (skin integrity, edema, etc.): SpO2 86-96% on 2L, confused, lethargic, RN and MD notified    Exercises     Assessment/Plan    PT Assessment Patient needs continued PT services  PT Problem List Decreased strength;Decreased balance;Decreased activity tolerance;Decreased mobility       PT Treatment Interventions DME instruction;Balance training;Gait training;Neuromuscular re-education;Stair training;Functional mobility training;Patient/family education;Therapeutic activities;Therapeutic exercise    PT Goals (Current goals can be found in the Care Plan section)  Acute Rehab PT Goals Patient Stated Goal: to get better PT Goal Formulation: With family Time For Goal Achievement: 12/25/23 Potential to Achieve Goals: Good    Frequency Min  2X/week     Co-evaluation PT/OT/SLP Co-Evaluation/Treatment: Yes Reason for Co-Treatment: Necessary to address cognition/behavior during functional activity;For patient/therapist safety;To address functional/ADL transfers PT goals addressed during session: Mobility/safety with mobility;Balance;Proper use of DME OT goals addressed during session: ADL's and self-care;Proper use of Adaptive equipment and DME       AM-PAC PT "6 Clicks" Mobility  Outcome Measure Help needed turning from your back to your side while in a flat bed without using bedrails?: A Little Help needed moving from lying on your back to sitting on the side of a flat bed without using bedrails?: A Little Help needed moving to and from a bed to a chair (including a wheelchair)?: A Lot Help needed standing up from a chair using your arms (e.g., wheelchair or bedside chair)?: A Lot Help needed to walk in hospital room?: A Lot Help needed climbing 3-5 steps with a railing? : Total 6 Click Score: 13    End of Session Equipment Utilized During Treatment: Gait belt Activity Tolerance: Patient limited by lethargy Patient left: in bed;with call bell/phone within reach;with bed alarm set Nurse Communication: Mobility status;Other (comment) (MD/RN notified of pt lethargy) PT Visit Diagnosis: Other abnormalities of gait and mobility (R26.89);Difficulty in walking, not elsewhere classified (R26.2);Muscle weakness (generalized) (  M62.81)    Time: 1610-9604 PT Time Calculation (min) (ACUTE ONLY): 7 min   Charges:   PT Evaluation $PT Eval Low Complexity: 1 Low   PT General Charges $$ ACUTE PT VISIT: 1 Visit        Darien Eden PT, DPT 11:44 AM,12/11/23

## 2023-12-11 NOTE — [place] FL2 (Signed)
 [place]  MEDICAID FL2 LEVEL OF CARE FORM     IDENTIFICATION  Patient Name: Jonathan Cross. Birthdate: 1945/07/21 Sex: male Admission Date (Current Location): 12/09/2023  [facility] and [place] Number:  Chiropodist and Address:  [facility], [address] [place], [place] [address]      Provider Number: [id number]  Attending Physician Name and Address:  Montey Apa, DO  Relative Name and Phone Number:  Claudy Abdallah, son, phone: [phone] and Janine Melbourne, daughter, phone: [phone]    Current Level of Care: Hospital Recommended Level of Care: Skilled Nursing Facility Prior Approval Number:    Date Approved/Denied: 05/16/21 PASRR Number: [id number]  Discharge Plan: SNF    Current Diagnoses: Patient Active Problem List   Diagnosis Date Noted   Sepsis (HCC) 12/09/2023   Obesity, Class III, BMI 40-49.9 (morbid obesity) 12/09/2023   Chest pain 08/24/2023   Iron  deficiency anemia 08/06/2023   Angina at rest [facility]) 06/01/2023   Pneumonia 06/01/2023   UTI (urinary tract infection) 06/01/2023   Encephalopathy 06/01/2023   Rectal bleeding 05/14/2023   Pressure injury of skin 12/14/2022   Uncontrolled type 2 diabetes mellitus with hyperglycemia, with long-term current use of insulin  (HCC) 12/08/2022   AKI (acute kidney injury) (HCC) 12/08/2022   Dry skin 10/21/2022   Atypical chest pain 08/11/2022   Fatigue 06/17/2022   Goals of care, counseling/discussion 06/16/2022   Myelodysplastic syndrome (HCC) 06/12/2022   Symptomatic anemia 05/01/2022   Chest pressure 05/01/2022   Chronic chest pain 02/17/2022   History of endocarditis 11/13/2021   Chronic anemia 11/13/2021   Acute on chronic HFrEF (heart failure with reduced ejection fraction) (HCC) 11/13/2021   Paroxysmal atrial fibrillation/A-flutter (HCC) 09/12/2021   Thrombocytopenia (HCC) 09/12/2021   Vertigo, acute CVA ruled out via MRI, likely BPPV but assoc w/  ambulatory dysfunction and not responsive to conservative tx 09/11/2021   Chronic heart failure with preserved ejection fraction (HFpEF) (HCC) 06/14/2021   Atrial fibrillation (HCC) 06/02/2021   Severe sepsis (HCC) 06/02/2021   Infection of scalp 06/02/2021   Elevated troponin 06/02/2021   Pancytopenia (HCC) 06/02/2021   Cellulitis of scalp 06/02/2021   Tinea corporis    Swelling of toe of left foot 11/02/2019   Aortic valve endocarditis    Coronary artery disease    Hypoalbuminemia 06/29/2019   Hyperglycemia 06/29/2019   Lactic acidosis 06/29/2019   GERD (gastroesophageal reflux disease) 06/29/2019   Staghorn renal calculus 06/29/2019   Nonrheumatic aortic valve stenosis 05/13/2019   Hyperlipidemia LDL goal <70 05/13/2019   Preop cardiovascular exam 05/13/2019   Bacteremia due to Enterococcus 04/18/2019   Class 2 severe obesity due to excess calories with serious comorbidity and body mass index (BMI) of 38.0 to 38.9 in adult (HCC) 03/24/2018   S/P aortic valve replacement 08/07/2016   Status post coronary artery bypass graft 08/07/2016   Coronary artery disease of native artery of native heart with stable angina pectoris (HCC) 02/11/2016   SVT (supraventricular tachycardia) (HCC) 07/31/2015   Type 2 diabetes mellitus without complication, with long-term current use of insulin  (HCC) 03/28/2015   Benign prostatic hyperplasia 04/07/2014   OSA (obstructive sleep apnea) 04/07/2014   Essential hypertension 05/16/2009    Orientation RESPIRATION BLADDER Height & Weight     Situation, Time, Self  Normal Incontinent Weight: 117.9 kg Height:  5\' 5"  (165.1 cm)  BEHAVIORAL SYMPTOMS/MOOD NEUROLOGICAL BOWEL NUTRITION STATUS      Incontinent Diet (Please see discharge summary)  AMBULATORY STATUS COMMUNICATION  OF NEEDS Skin   Limited Assist Verbally  (Erythema/redness right foot; left and right leg)                       Personal Care Assistance Level of Assistance  Bathing,  Feeding, Dressing, Total care Bathing Assistance: Limited assistance Feeding assistance: Limited assistance Dressing Assistance: Limited assistance Total Care Assistance: Limited assistance   Functional Limitations Info  Sight, Hearing Sight Info: Impaired Hearing Info: Impaired      SPECIAL CARE FACTORS FREQUENCY  PT (By licensed PT), OT (By licensed OT)     PT Frequency: 5 x per week OT Frequency: 5 x per week            Contractures      Additional Factors Info  Code Status Code Status Info: Full             Current Medications (12/11/2023):  This is the current hospital active medication list Current Facility-Administered Medications  Medication Dose Route Frequency Provider Last Rate Last Admin   acetaminophen  (TYLENOL ) tablet 1,000 mg  1,000 mg Oral Q8H Griffith, Kelly A, DO       atorvastatin  (LIPITOR ) tablet 80 mg  80 mg Oral QPM Cox, Amy N, DO   80 mg at 12/10/23 2240   cefTRIAXone  (ROCEPHIN ) 2 g in sodium chloride  0.9 % 100 mL IVPB  2 g Intravenous Q24H Darus Engels A, DO 200 mL/hr at 12/11/23 1228 2 g at 12/11/23 1228   enoxaparin  (LOVENOX ) injection 60 mg  0.5 mg/kg Subcutaneous Q24H Darus Engels A, DO   60 mg at 12/10/23 2241   hydrALAZINE  (APRESOLINE ) injection 5 mg  5 mg Intravenous Q6H PRN Cox, Amy N, DO       insulin  aspart (novoLOG ) injection 0-20 Units  0-20 Units Subcutaneous TID WC Cox, Amy N, DO   3 Units at 12/11/23 1223   insulin  aspart (novoLOG ) injection 0-5 Units  0-5 Units Subcutaneous QHS Cox, Amy N, DO   2 Units at 12/10/23 2240   insulin  glargine-yfgn (SEMGLEE ) injection 50 Units  50 Units Subcutaneous QHS Madelynn Schilder, RPH   50 Units at 12/10/23 2239   isosorbide  mononitrate (IMDUR ) 24 hr tablet 15 mg  15 mg Oral Daily Cox, Amy N, DO   15 mg at 12/11/23 3903   metoprolol  succinate (TOPROL -XL) 24 hr tablet 12.5 mg  12.5 mg Oral BID Cox, Amy N, DO   12.5 mg at 12/11/23 0092   nitroGLYCERIN  (NITROSTAT ) SL tablet 0.4 mg  0.4 mg  Sublingual Q5 Min x 3 PRN Cox, Amy N, DO       ondansetron  (ZOFRAN ) tablet 4 mg  4 mg Oral Q6H PRN Cox, Amy N, DO       Or   ondansetron  (ZOFRAN ) injection 4 mg  4 mg Intravenous Q6H PRN Cox, Amy N, DO   4 mg at 12/10/23 0902   Oral care mouth rinse  15 mL Mouth Rinse PRN Cox, Amy N, DO       oxyCODONE  (Oxy IR/ROXICODONE ) immediate release tablet 5 mg  5 mg Oral Q6H PRN Darus Engels A, DO       pantoprazole  (PROTONIX ) EC tablet 80 mg  80 mg Oral Daily Cox, Amy N, DO   80 mg at 12/11/23 3300   ranolazine  (RANEXA ) 12 hr tablet 500 mg  500 mg Oral BID Cox, Amy N, DO   500 mg at 12/11/23 0839   senna-docusate (Senokot-S) tablet 1 tablet  1 tablet Oral QHS PRN Cox, Amy N, DO       tamsulosin  (FLOMAX ) capsule 0.4 mg  0.4 mg Oral QPC supper Cox, Amy N, DO   0.4 mg at 12/10/23 1728   traMADol (ULTRAM) tablet 50 mg  50 mg Oral Q6H PRN Montey Apa, DO       vancomycin  (VANCOREADY) IVPB 1250 mg/250 mL  1,250 mg Intravenous Q24H Malone Sear, RPH   Stopped at 12/10/23 2001     Discharge Medications: Please see discharge summary for a list of discharge medications.  Relevant Imaging Results:  Relevant Lab Results:   Additional Information SSN: [id number]  Crayton Docker, RN

## 2023-12-11 NOTE — Plan of Care (Signed)

## 2023-12-11 NOTE — TOC Initial Note (Signed)
 Transition of Care (TOC) - Initial/Assessment Note    Patient Details  Name: Jonathan Cross. MRN: [id number] Date of Birth: 03-10-1946  Transition of Care [facility]) CM/SW Contact:    Crayton Docker, RN 12/11/2023, 12:06 PM  Clinical Narrative:                  CM to patient's room regarding TOC screening assessment. Patient's son, Suszanne Eriksson, is at patient's bedside. Patient provided permission to CM to discuss care with patient's son, Suszanne Eriksson. Per patient's son, patient lives alone. Patient uses walker, home oxygen and CPAP. Per patient's son, does not know name of oxygen vendor. Per patient's son, patient has home health and SNF experience. Per patient's son, [facility] is active in patient's home for RN and PT services. Per patient's son, patient was recently discharged from [facility]. CM and patient's son discussed SNF recommendations. Per patient's son SNF preference is [facility]. Per patient's son, patient's daughter Bridgette Campus will soon reside with patient. CM will complete SNF work up.  Expected Discharge Plan: Skilled Nursing Facility Barriers to Discharge: Continued Medical Work up   Patient Goals and CMS Choice    SNF/[facility]   Expected Discharge Plan and Services     SNF  Prior Living Arrangements/Services   Lives with:: Self Patient language and need for interpreter reviewed:: No        Need for Family Participation in Patient Care: Yes (Comment) Care giver support system in place?: Yes (comment) Current home services: DME Criminal Activity/Legal Involvement Pertinent to Current Situation/Hospitalization: No - Comment as needed  Activities of Daily Living   ADL Screening (condition at time of admission) Independently performs ADLs?: Yes (appropriate for developmental age) Is the patient deaf or have difficulty hearing?: Yes Does the patient have difficulty seeing, even when wearing glasses/contacts?: No Does  the patient have difficulty concentrating, remembering, or making decisions?: No  Permission Sought/Granted   Permission granted to share information with : Yes, Verbal Permission Granted  Share Information with NAME: Hymie Gorr     Permission granted to share info w Relationship: Son/Daughter  Permission granted to share info w Contact Information: yes  Emotional Assessment Appearance:: Appears stated age Attitude/Demeanor/Rapport: Engaged Affect (typically observed): Calm     Psych Involvement: No (comment)  Admission diagnosis:  Cellulitis of right foot [L03.115] Sepsis (HCC) [A41.9] Sepsis without acute organ dysfunction, due to unspecified organism [facility]) [A41.9] Patient Active Problem List   Diagnosis Date Noted   Sepsis (HCC) 12/09/2023   Obesity, Class III, BMI 40-49.9 (morbid obesity) 12/09/2023   Chest pain 08/24/2023   Iron  deficiency anemia 08/06/2023   Angina at rest [facility]) 06/01/2023   Pneumonia 06/01/2023   UTI (urinary tract infection) 06/01/2023   Encephalopathy 06/01/2023   Rectal bleeding 05/14/2023   Pressure injury of skin 12/14/2022   Uncontrolled type 2 diabetes mellitus with hyperglycemia, with long-term current use of insulin  (HCC) 12/08/2022   AKI (acute kidney injury) (HCC) 12/08/2022   Dry skin 10/21/2022   Atypical chest pain 08/11/2022   Fatigue 06/17/2022   Goals of care, counseling/discussion 06/16/2022   Myelodysplastic syndrome (HCC) 06/12/2022   Symptomatic anemia 05/01/2022   Chest pressure 05/01/2022   Chronic chest pain 02/17/2022   History of endocarditis 11/13/2021   Chronic anemia 11/13/2021   Acute on chronic HFrEF (heart failure with reduced ejection fraction) (HCC) 11/13/2021   Paroxysmal atrial fibrillation/A-flutter (HCC) 09/12/2021   Thrombocytopenia (HCC) 09/12/2021  Vertigo, acute CVA ruled out via MRI, likely BPPV but assoc w/ ambulatory dysfunction and not responsive to conservative tx 09/11/2021    Chronic heart failure with preserved ejection fraction (HFpEF) (HCC) 06/14/2021   Atrial fibrillation (HCC) 06/02/2021   Severe sepsis (HCC) 06/02/2021   Infection of scalp 06/02/2021   Elevated troponin 06/02/2021   Pancytopenia (HCC) 06/02/2021   Cellulitis of scalp 06/02/2021   Tinea corporis    Swelling of toe of left foot 11/02/2019   Aortic valve endocarditis    Coronary artery disease    Hypoalbuminemia 06/29/2019   Hyperglycemia 06/29/2019   Lactic acidosis 06/29/2019   GERD (gastroesophageal reflux disease) 06/29/2019   Staghorn renal calculus 06/29/2019   Nonrheumatic aortic valve stenosis 05/13/2019   Hyperlipidemia LDL goal <70 05/13/2019   Preop cardiovascular exam 05/13/2019   Bacteremia due to Enterococcus 04/18/2019   Class 2 severe obesity due to excess calories with serious comorbidity and body mass index (BMI) of 38.0 to 38.9 in adult [facility]) 03/24/2018   S/P aortic valve replacement 08/07/2016   Status post coronary artery bypass graft 08/07/2016   Coronary artery disease of native artery of native heart with stable angina pectoris (HCC) 02/11/2016   SVT (supraventricular tachycardia) (HCC) 07/31/2015   Type 2 diabetes mellitus without complication, with long-term current use of insulin  (HCC) 03/28/2015   Benign prostatic hyperplasia 04/07/2014   OSA (obstructive sleep apnea) 04/07/2014   Essential hypertension 05/16/2009   PCP:  Lucina Sabal, MD Pharmacy:   [facility] [address] ([address]), [place] - [address] [place] ([place]) [place] [address] Phone: [phone] Fax: [phone]     Social Drivers of Health (SDOH) Social History: SDOH Screenings   Food Insecurity: No Food Insecurity (12/09/2023)  Housing: Low Risk  (12/09/2023)  Transportation Needs: No Transportation Needs (12/09/2023)  Utilities: Not At Risk (12/09/2023)  Financial Resource Strain: Low Risk  (11/03/2023)   Received from [facility]  Physical Activity: Inactive (04/24/2021)   Received from [facility], [facility]  Social Connections: Socially Isolated (12/09/2023)  Stress: No Stress Concern Present (04/24/2021)   Received from [facility], [facility]  Tobacco Use: Medium Risk (12/09/2023)  Health Literacy: High Risk (02/25/2023)   [facility] from [facility]   SDOH Interventions:     Readmission Risk Interventions     No data to display

## 2023-12-12 DIAGNOSIS — I480 Paroxysmal atrial fibrillation: Secondary | ICD-10-CM | POA: Diagnosis not present

## 2023-12-12 DIAGNOSIS — G4733 Obstructive sleep apnea (adult) (pediatric): Secondary | ICD-10-CM | POA: Diagnosis not present

## 2023-12-12 DIAGNOSIS — A419 Sepsis, unspecified organism: Secondary | ICD-10-CM | POA: Diagnosis not present

## 2023-12-12 DIAGNOSIS — R6520 Severe sepsis without septic shock: Secondary | ICD-10-CM | POA: Diagnosis not present

## 2023-12-12 LAB — BASIC METABOLIC PANEL WITH GFR
Anion gap: 9 (ref 5–15)
BUN: 16 mg/dL (ref 8–23)
CO2: 23 mmol/L (ref 22–32)
Calcium: 8.2 mg/dL — ABNORMAL LOW (ref 8.9–10.3)
Chloride: 103 mmol/L (ref 98–111)
Creatinine, Ser: 1.03 mg/dL (ref 0.61–1.24)
GFR, Estimated: >60 mL/min (ref >60)
Glucose, Bld: 145 mg/dL — ABNORMAL HIGH (ref 70–99)
Potassium: 3.6 mmol/L (ref 3.5–5.1)
Sodium: 135 mmol/L (ref 135–145)

## 2023-12-12 LAB — GLUCOSE, CAPILLARY
Glucose-Capillary: 133 mg/dL — ABNORMAL HIGH (ref 70–99)
Glucose-Capillary: 162 mg/dL — ABNORMAL HIGH (ref 70–99)
Glucose-Capillary: 211 mg/dL — ABNORMAL HIGH (ref 70–99)
Glucose-Capillary: 202 mg/dL — ABNORMAL HIGH (ref 70–99)

## 2023-12-12 LAB — CBC
HCT: 25.2 % — ABNORMAL LOW (ref 39.0–52.0)
Hemoglobin: 8 g/dL — ABNORMAL LOW (ref 13.0–17.0)
MCH: 36.4 pg — ABNORMAL HIGH (ref 26.0–34.0)
MCHC: 31.7 g/dL (ref 30.0–36.0)
MCV: 114.5 fL — ABNORMAL HIGH (ref 80.0–100.0)
Platelets: 110 10*3/uL — ABNORMAL LOW (ref 150–400)
RBC: 2.2 MIL/uL — ABNORMAL LOW (ref 4.22–5.81)
RDW: 15.8 % — ABNORMAL HIGH (ref 11.5–15.5)
WBC: 5 10*3/uL (ref 4.0–10.5)
nRBC: 0 % (ref 0.0–0.2)

## 2023-12-12 MED ORDER — OXYCODONE HCL 5 MG PO TABS
5.0000 mg | ORAL_TABLET | ORAL | Status: DC | PRN
  Administered 2023-12-12 – 2023-12-15 (×6): 5 mg via ORAL
  Filled 2023-12-12 (×6): qty 1

## 2023-12-12 MED ORDER — SENNOSIDES-DOCUSATE SODIUM 8.6-50 MG PO TABS
1.0000 | ORAL_TABLET | Freq: Two times a day (BID) | ORAL | Status: DC
  Administered 2023-12-12 – 2023-12-15 (×4): 1 via ORAL
  Filled 2023-12-12 (×7): qty 1

## 2023-12-12 MED ORDER — POLYETHYLENE GLYCOL 3350 17 G PO PACK
17.0000 g | PACK | Freq: Every day | ORAL | Status: DC
  Administered 2023-12-13 – 2023-12-15 (×3): 17 g via ORAL
  Filled 2023-12-12 (×4): qty 1

## 2023-12-12 NOTE — TOC Progression Note (Signed)
 Transition of Care (TOC) - Progression Note    Patient Details  Name: Jonathan Cross. MRN: [id number] Date of Birth: Apr 07, 1946  Transition of Care [facility]) CM/SW Contact  Alexandra Ice, RN Phone Number: 12/12/2023, 1:30 PM  Clinical Narrative:     Met with patient and son, Suszanne Eriksson, at bedside. TOC explained purpose of visit, to discuss returning to [facility]. Son stated that is not a possibility as he is in his copayment days, and they cannot afford to pay the daily co-pay rate. TOC discussed home health services with patient and son, and are agreeable. He also thinks a wheelchair maybe helpful for patient, discussed with MD.  They have no preference on which agency to use.   Expected Discharge Plan: Skilled Nursing Facility Barriers to Discharge: Continued Medical Work up  Expected Discharge Plan and Services                                               Social Determinants of Health (SDOH) Interventions SDOH Screenings   Food Insecurity: No Food Insecurity (12/09/2023)  Housing: Low Risk  (12/09/2023)  Transportation Needs: No Transportation Needs (12/09/2023)  Utilities: Not At Risk (12/09/2023)  Financial Resource Strain: Low Risk  (11/03/2023)   [facility]  Physical Activity: Inactive (04/24/2021)   Received from [facility], [facility]  Social Connections: Socially Isolated (12/09/2023)  Stress: No Stress Concern Present (04/24/2021)   Received from [facility], [facility]  Tobacco Use: Medium Risk (12/09/2023)  Health Literacy: High Risk (02/25/2023)   Received from [facility]    Readmission Risk Interventions     No data to display

## 2023-12-12 NOTE — Progress Notes (Signed)
 Occupational Therapy Treatment Patient Details Name: Jonathan Cross. MRN: [id number] DOB: Jun 26, 1946 Today's Date: 12/12/2023   History of present illness Jonathan Cross. is a 78 y.o. male that presented to ED with R foot pain/swelling, workup for cellulitis. with medical history significant of CAD status post CABG, severe aortic stenosis status post bioprosthetic AVR complicated by endocarditis, PAF not on Eliquis  due to severe rectal bleeding, chronic HFpEF, HTN, IIDM, MDS chronic normocytic anemia, obesity, OSA   OT comments  Pt seen this date with focus on self care tasks as well as functional mobility, safety and balance.   Pt performing toileting with min guard to and from the bathroom with use of rolling walker.  Able to complete toilet transfer with use of grab bar with min guard and cues.  Hygiene in standing at sink for brief period of time and then completed tasks in sitting due to increased pain and fatigue.  Pt easily distracted during session and required redirection to tasks.  Pt reports increased pain in his foot and states medicine helps briefly.  Functional balance impaired during transfers and mobility and requires CGA to min assist for safety.  Continue towards goals in plan of care to maximize safety and independence in necessary daily tasks and to reduce caregiver burden.        If plan is discharge home, recommend the following:  A little help with walking and/or transfers;Assistance with cooking/housework;Assist for transportation;Direct supervision/assist for financial management;Direct supervision/assist for medications management;Supervision due to cognitive status;Help with stairs or ramp for entrance;A little help with bathing/dressing/bathroom   Equipment Recommendations       Recommendations for Other Services      Precautions / Restrictions Precautions Precautions: Fall Recall of Precautions/Restrictions: Impaired Restrictions Weight Bearing  Restrictions Per Provider Order: No       Mobility Bed Mobility               General bed mobility comments: pt up in recliner start/end of session    Transfers Overall transfer level: Needs assistance Equipment used: Rolling walker (2 wheels) Transfers: Sit to/from Stand Sit to Stand: Contact guard assist                 Balance Overall balance assessment: Needs assistance Sitting-balance support: No upper extremity supported, Feet supported Sitting balance-Leahy Scale: Good     Standing balance support: Bilateral upper extremity supported, Reliant on assistive device for balance Standing balance-Leahy Scale: Fair                             ADL either performed or assessed with clinical judgement   ADL Overall ADL's : Needs assistance/impaired     Grooming: Sitting;Set up;Wash/dry face;Supervision/safety               Lower Body Dressing: Minimal assistance   Toilet Transfer: Contact guard assist   Toileting- Clothing Manipulation and Hygiene: Contact guard assist       Functional mobility during ADLs: Contact guard assist;Rolling walker (2 wheels) General ADL Comments: Pt more alert this date but increased pain, reports he already had pain medication.    Extremity/Trunk Assessment Upper Extremity Assessment Upper Extremity Assessment: Generalized weakness   Lower Extremity Assessment Lower Extremity Assessment: Defer to PT evaluation        Vision       Perception     Praxis     Communication Communication Communication: Impaired Factors Affecting  Communication: Reduced clarity of speech   Cognition Arousal: Alert Behavior During Therapy: Flat affect Cognition: Difficult to assess                                 Following commands impaired: Only follows one step commands consistently      Cueing   Cueing Techniques: Verbal cues, Gestural cues, Tactile cues  Exercises      Shoulder Instructions        General Comments      Pertinent Vitals/ Pain       Pain Assessment Pain Score: 10-Worst pain ever Pain Location: R foot Pain Descriptors / Indicators: Grimacing, Moaning Pain Intervention(s): Repositioned, Limited activity within patient's tolerance, Monitored during session  Home Living                                          Prior Functioning/Environment              Frequency  Min 2X/week        Progress Toward Goals  OT Goals(current goals can now be found in the care plan section)  Progress towards OT goals: Progressing toward goals  Acute Rehab OT Goals Patient Stated Goal: to get better and go home OT Goal Formulation: With patient/family Time For Goal Achievement: 12/25/23 Potential to Achieve Goals: Good  Plan      Co-evaluation                 AM-PAC OT "6 Clicks" Daily Activity     Outcome Measure   Help from another person eating meals?: None Help from another person taking care of personal grooming?: A Little Help from another person toileting, which includes using toliet, bedpan, or urinal?: A Little Help from another person bathing (including washing, rinsing, drying)?: A Little Help from another person to put on and taking off regular upper body clothing?: None Help from another person to put on and taking off regular lower body clothing?: A Little 6 Click Score: 20    End of Session Equipment Utilized During Treatment: Oxygen;Rolling walker (2 wheels)  OT Visit Diagnosis: Other abnormalities of gait and mobility (R26.89);Muscle weakness (generalized) (M62.81)   Activity Tolerance Patient tolerated treatment well;Patient limited by pain   Patient Left with call bell/phone within reach;with chair alarm set;in chair   Nurse Communication          Time: 1535-1610 OT Time Calculation (min): 35 min  Charges: OT General Charges $OT Visit: 1 Visit OT Treatments $Self Care/Home Management : 23-37  mins  Jonathan Cross T Jonathan Cross, OTR/L, CLT   Jonathan Cross 12/12/2023, 4:31 PM

## 2023-12-12 NOTE — Progress Notes (Signed)
 Physical Therapy Treatment Patient Details Name: Jonathan Cross. MRN: [id number] DOB: 01-10-1946 Today's Date: 12/12/2023   History of Present Illness Jonathan Cross. is a 78 y.o. male that presented to ED with R foot pain/swelling, workup for cellulitis. with medical history significant of CAD status post CABG, severe aortic stenosis status post bioprosthetic AVR complicated by endocarditis, PAF not on Eliquis  due to severe rectal bleeding, chronic HFpEF, HTN, IIDM, MDS chronic normocytic anemia, obesity, OSA    PT Comments  Pt alert, agreeable to PT, up in recliner. Reported 5/10 pain in R foot. He was able to transfer with CGA and RW, extra time. He ambulated ~44ft total, but had to standing rest breaks, one to scratch his back on the door jam, the second due to fatigue requiring him to lean against the wall. Returned to recliner and repositioned for comfort. The patient would benefit from further skilled PT intervention to continue to progress towards goals.     If plan is discharge home, recommend the following: A lot of help with walking and/or transfers;A lot of help with bathing/dressing/bathroom;Assist for transportation;Assistance with cooking/housework;Help with stairs or ramp for entrance   Can travel by private vehicle     No  Equipment Recommendations  Other (comment) (TBD)    Recommendations for Other Services       Precautions / Restrictions Precautions Precautions: Fall Recall of Precautions/Restrictions: Impaired Restrictions Weight Bearing Restrictions Per Provider Order: No     Mobility  Bed Mobility               General bed mobility comments: pt up in recliner start/end of session    Transfers Overall transfer level: Needs assistance Equipment used: Rolling walker (2 wheels) Transfers: Sit to/from Stand Sit to Stand: Contact guard assist                Ambulation/Gait Ambulation/Gait assistance: Contact guard assist Gait  Distance (Feet): 25 Feet Assistive device: Rolling walker (2 wheels)         General Gait Details: limited distance due to fatigue and pain   Stairs             Wheelchair Mobility     Tilt Bed    Modified Rankin (Stroke Patients Only)       Balance Overall balance assessment: Needs assistance Sitting-balance support: No upper extremity supported, Feet supported Sitting balance-Leahy Scale: Fair     Standing balance support: Bilateral upper extremity supported, Reliant on assistive device for balance Standing balance-Leahy Scale: Fair                              Hotel manager: Impaired Factors Affecting Communication: Reduced clarity of speech  Cognition Arousal: Alert Behavior During Therapy: Flat affect                           PT - Cognition Comments: pt alert, speaks during session Following commands: Impaired Following commands impaired: Only follows one step commands consistently    Cueing Cueing Techniques: Verbal cues, Gestural cues, Tactile cues  Exercises      General Comments        Pertinent Vitals/Pain Pain Assessment Pain Assessment: 0-10 Pain Score: 5  Pain Location: R foot Pain Descriptors / Indicators: Grimacing, Moaning Pain Intervention(s): Limited activity within patient's tolerance, Monitored during session, Repositioned    Home Living  Prior Function            PT Goals (current goals can now be found in the care plan section) Progress towards PT goals: Progressing toward goals    Frequency    Min 2X/week      PT Plan      Co-evaluation              AM-PAC PT "6 Clicks" Mobility   Outcome Measure  Help needed turning from your back to your side while in a flat bed without using bedrails?: A Little Help needed moving from lying on your back to sitting on the side of a flat bed without using bedrails?: A  Little Help needed moving to and from a bed to a chair (including a wheelchair)?: A Lot Help needed standing up from a chair using your arms (e.g., wheelchair or bedside chair)?: A Lot Help needed to walk in hospital room?: A Lot Help needed climbing 3-5 steps with a railing? : Total 6 Click Score: 13    End of Session   Activity Tolerance: Patient limited by fatigue;Patient limited by pain Patient left: in chair;with call bell/phone within reach;with chair alarm set Nurse Communication: Mobility status PT Visit Diagnosis: Other abnormalities of gait and mobility (R26.89);Difficulty in walking, not elsewhere classified (R26.2);Muscle weakness (generalized) (M62.81)     Time: 4098-1191 PT Time Calculation (min) (ACUTE ONLY): 9 min  Charges:    $Therapeutic Activity: 8-22 mins PT General Charges $$ ACUTE PT VISIT: 1 Visit                     Darien Eden PT, DPT 2:47 PM,12/12/23

## 2023-12-12 NOTE — Plan of Care (Signed)

## 2023-12-12 NOTE — Plan of Care (Signed)
  Problem: Education: Goal: Ability to describe self-care measures that may prevent or decrease complications (Diabetes Survival Skills Education) will improve Outcome: Progressing Goal: Individualized Educational Video(s) Outcome: Progressing   Problem: Coping: Goal: Ability to adjust to condition or change in health will improve Outcome: Progressing   Problem: Fluid Volume: Goal: Ability to maintain a balanced intake and output will improve Outcome: Progressing   Problem: Health Behavior/Discharge Planning: Goal: Ability to identify and utilize available resources and services will improve Outcome: Progressing Goal: Ability to manage health-related needs will improve Outcome: Progressing   Problem: Nutritional: Goal: Maintenance of adequate nutrition will improve Outcome: Progressing Goal: Progress toward achieving an optimal weight will improve Outcome: Progressing   Problem: Fluid Volume: Goal: Hemodynamic stability will improve Outcome: Progressing   Problem: Respiratory: Goal: Ability to maintain adequate ventilation will improve Outcome: Progressing   Problem: Clinical Measurements: Goal: Ability to maintain clinical measurements within normal limits will improve Outcome: Progressing Goal: Will remain free from infection Outcome: Progressing Goal: Diagnostic test results will improve Outcome: Progressing Goal: Respiratory complications will improve Outcome: Progressing Goal: Cardiovascular complication will be avoided Outcome: Progressing

## 2023-12-12 NOTE — Progress Notes (Addendum)
 Progress Note   Patient: Jonathan Cross. NGE:[id number] DOB: 02-Jan-1946 DOA: 12/09/2023     3 DOS: the patient was seen and examined on 12/12/2023   Brief hospital course:  "Mr. Rondall, Radigan. is an 78 year old male with history of insulin -dependent diabetes mellitus, hypertension, GERD, hyperlipidemia, who presents emergency department for chief concerns of fever, cough, chills... right foot pain and general malaise x 3 days" See H&P for full HPI on admission & ED course.  Patient was admitted for further evaluation and management of sepsis due to right foot cellulitis and suspected UTI, started on empiric IV antibiotics pending culture and MRI of the right foot to assess for underlying osteomyelitis.  Further hospital course and management as outlined below.    Assessment and Plan:  *Severe Sepsis -- POA with tachypnea, tachycardia, leukocytosis, lactic acidosis consistent with organ dysfunction Right foot cellulitis - osteomyelitis ruled out.  Small abscess vs blister in 1st web space was drained by podiatry 5/29.  MRI right foot negative for osteomyelitis Pyuria, Bacteruria -- POA with UA large leukocytes, > 50 WBC's, rare bacteria.  Urine culture - No Growth.  UTI ruled out. 5/30 -- recurrent fever 100.5 F this AM 5/31 -- afebrile, early improvement in right great toe swelling and erythema --Podiatry consulted on admission, I&D'd right foot blister 5/29 --Continue IV Vancomycin  and Rocephin .  (Initially Vanc+Cefepime ) --Follow blood cultures - negative to date --Follow right foot culture -- growing rare staph aureus, susceptibilities pending --Right foot dressing change daily - per podiatry --Monitor fever curve, CBC, hemodynamics --Pain control, anti-pyretics PRN  Generalized weakness --PT/OT evaluations --Patient discharged home from SNF/rehab on May 4th per son --Fall precautions  Macrocytic anemia -- Hbg 9.9 on admission, pt got sepsis IV fluids. Hbg trend  9.9 >> 8.5 >> 8.1 >> 8.0.  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 Platelets 136 >> 119 >> 116 >> 110k --Monitor CBC --Low threshold to hold Lovenox  if plt's continue to trend down  Myelodysplastic syndrome  With baseline chest pain that is unchanged --Monitor  Type 2 diabetes mellitus without complication, with long-term current use of insulin  (HCC) Patient takes NPH 100 units twice daily CBG's overall at inpatient goal --Semglee  50 units at bedtime --Sliding scale Novolog  AC/HS (resistant 0-20 units) --Titrate regimen for inpatient goal 140-180  OSA (obstructive sleep apnea) --CPAP nightly  Essential hypertension --Continue home Toprol -XL, Imdur  --PRN hydralazine   --Titrate regimen  Obesity, Class III, BMI 40-49.9  Body mass index is 43.27 kg/m. Complicates overall care and prognosis.  Recommend lifestyle modifications including physical activity and diet for weight loss and overall long-term health.      Subjective: Pt seen with son at bedside today.  Pt is more alert and talkative since not getting morphine , and use of CPAP last night.  Pt reports ongoing right foot pain, but overall better since admission.  Denies other acute complaints.    Physical Exam: Vitals:   12/11/23 1501 12/11/23 2027 12/12/23 0600 12/12/23 0754  BP: (!) 110/55 (!) 122/58 136/66 (!) 139/59  Pulse: 75 75 71 74  Resp: 19 18 18 16   Temp: 98.2 F (36.8 C) 98.3 F (36.8 C)  (!) 97.5 F (36.4 C)  TempSrc: Oral     SpO2: 96% 92% 97% 91%  Weight:      Height:       General exam: awake, alert, talkative, no acute distress, obese HEENT: moist mucus  membranes, hearing grossly normal  Respiratory system: lungs clear but diminished, normal respiratory effort, on room air. Cardiovascular system: normal S1/S2, RRR, no pedal edema.   Gastrointestinal system: soft, NT, ND, no HSM felt, +bowel sounds. Central nervous  system: A&O x3. no gross focal neurologic deficits, normal speech Extremities: right foot guaze dressing in place with great toe erythema beginning to fade, swelling of the toe is slightly improved today Skin: dry, intact, no rashes seen, right foot as above Psychiatry: normal mood, congruent affect, judgement and insight appear normal   Data Reviewed:  Notable labs and studies ---   Glucose 145, Ca 8.2 otherwise normal BMP  Hbg 8.5 >> 8.1 >> 8.0 Platelets 119 >> 116 >> 110k     Family Communication: son at bedside on rounds 5/29, 5/30, 5/31  Disposition: Status is: Inpatient Remains inpatient appropriate because: remains on empiric IV antibiotics pending cultures and further clinical improvement.      Planned Discharge Destination: SNF     Time spent: 42 minutes  Author: Montey Apa, DO 12/12/2023 10:32 AM  For on call review [email or web address].

## 2023-12-13 DIAGNOSIS — I10 Essential (primary) hypertension: Secondary | ICD-10-CM | POA: Diagnosis not present

## 2023-12-13 DIAGNOSIS — D469 Myelodysplastic syndrome, unspecified: Secondary | ICD-10-CM | POA: Diagnosis not present

## 2023-12-13 DIAGNOSIS — R6520 Severe sepsis without septic shock: Secondary | ICD-10-CM | POA: Diagnosis not present

## 2023-12-13 DIAGNOSIS — A419 Sepsis, unspecified organism: Secondary | ICD-10-CM | POA: Diagnosis not present

## 2023-12-13 LAB — GLUCOSE, CAPILLARY
Glucose-Capillary: 129 mg/dL — ABNORMAL HIGH (ref 70–99)
Glucose-Capillary: 145 mg/dL — ABNORMAL HIGH (ref 70–99)
Glucose-Capillary: 162 mg/dL — ABNORMAL HIGH (ref 70–99)
Glucose-Capillary: 173 mg/dL — ABNORMAL HIGH (ref 70–99)

## 2023-12-13 LAB — CBC
HCT: 24.8 % — ABNORMAL LOW (ref 39.0–52.0)
Hemoglobin: 7.9 g/dL — ABNORMAL LOW (ref 13.0–17.0)
MCH: 36.4 pg — ABNORMAL HIGH (ref 26.0–34.0)
MCHC: 31.9 g/dL (ref 30.0–36.0)
MCV: 114.3 fL — ABNORMAL HIGH (ref 80.0–100.0)
Platelets: 113 10*3/uL — ABNORMAL LOW (ref 150–400)
RBC: 2.17 MIL/uL — ABNORMAL LOW (ref 4.22–5.81)
RDW: 15.4 % (ref 11.5–15.5)
WBC: 4.6 10*3/uL (ref 4.0–10.5)
nRBC: 0 % (ref 0.0–0.2)

## 2023-12-13 MED ORDER — LINEZOLID 600 MG PO TABS
600.0000 mg | ORAL_TABLET | Freq: Two times a day (BID) | ORAL | Status: DC
  Administered 2023-12-13 – 2023-12-15 (×5): 600 mg via ORAL
  Filled 2023-12-13 (×6): qty 1

## 2023-12-13 NOTE — Progress Notes (Signed)
 Mobility Specialist - Progress Note   12/13/23 1106  Mobility  Activity Ambulated with assistance to bathroom  Level of Assistance Standby assist, set-up cues, supervision of patient - no hands on  Assistive Device None  Distance Ambulated (ft) 10 ft  Activity Response Tolerated well  Mobility Referral Yes  Mobility visit 1 Mobility  Mobility Specialist Start Time (ACUTE ONLY) 1001  Mobility Specialist Stop Time (ACUTE ONLY) 1028  Mobility Specialist Time Calculation (min) (ACUTE ONLY) 27 min   MS responds to chair alarm, Pt ambulates to/from bathroom defers AD but does furniture surf throughout. Pt returns to recliner with needs in reach and chair alarm activated.   Wash Hack  Mobility Specialist  12/13/23 11:15 AM

## 2023-12-13 NOTE — Progress Notes (Signed)
 PT Cancellation Note  Patient Details Name: Jonathan Cross. MRN: [id number] DOB: August 15, 1945   Cancelled Treatment:    Reason Eval/Treat Not Completed: Other (comment)  Pt in recliner stating he is feeling poorly today.  He is willing to try mobility but upon sitting up in recliner is light headed.  BP taken 127/61 P 78 O2 98% on room air.  He generally does not appear to feel well but no specific c/o.  Offered to return pt to bed but he stated he wanted to remain in recliner.  Family in room.  Pt did receive pain meds about 2 hours prior.  Will return at a later time/date.   Charlanne Cong 12/13/2023, 2:03 PM

## 2023-12-13 NOTE — Progress Notes (Signed)
 Progress Note   Patient: Jonathan Cross. ZOX:[id number] DOB: 04/11/1946 DOA: 12/09/2023     4 DOS: the patient was seen and examined on 12/13/2023   Brief hospital course:  "Mr. Jonathan, [name] is an 78 year old male with history of insulin -dependent diabetes mellitus, hypertension, GERD, hyperlipidemia, who presents emergency department for chief concerns of fever, cough, chills... right foot pain and general malaise x 3 days" See H&P for full HPI on admission & ED course.  Patient was admitted for further evaluation and management of sepsis due to right foot cellulitis and suspected UTI, started on empiric IV antibiotics pending culture and MRI of the right foot to assess for underlying osteomyelitis.  Further hospital course and management as outlined below.    Assessment and Plan:  *Severe Sepsis -- POA with tachypnea, tachycardia, leukocytosis, lactic acidosis consistent with organ dysfunction Right foot cellulitis - osteomyelitis ruled out.  Small abscess vs blister in 1st web space was drained by podiatry 5/29.  MRI right foot negative for osteomyelitis Pyuria, Bacteruria -- POA with UA large leukocytes, > 50 WBC's, rare bacteria.  Urine culture - No Growth.  UTI ruled out. Initially started on IV Vanc/Cefepime  >> Vanc/Rocephin  5/30 -- recurrent fever 100.5 F this AM 5/31 -- afebrile, early improvement in right great toe swelling and erythema 6/1 -- afebrile, foot abscess/blister culture +MRSA, transitioned to PO Zyvox --Podiatry consulted on admission, I&D'd right foot blister 5/29 --Culture grew MRSA, no anaerobes --Transition IV Vancomycin /Rocephin  >> PO Zyvox x 6 more days (10 day course) --Follow blood cultures - negative to date --Right foot dressing change daily - per podiatry --Monitor fever curve, CBC, hemodynamics --Pain control, anti-pyretics PRN  Generalized weakness --PT/OT evaluations --Patient discharged home from SNF/rehab on May 4th per  son --Fall precautions  Macrocytic anemia -- Hbg 9.9 on admission, pt got sepsis IV fluids. Hbg trend 9.9 >> 8.5 >> 8.1 >> 8.0.  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 Platelets 136 >> 119 >> 116 >> 110k --Monitor CBC --Low threshold to hold Lovenox  if plt's continue to trend down  Myelodysplastic syndrome  With baseline chest pain that is unchanged --Monitor  Type 2 diabetes mellitus without complication, with long-term current use of insulin  (HCC) Patient takes NPH 100 units twice daily CBG's overall at inpatient goal --Semglee  50 units at bedtime --Sliding scale Novolog  AC/HS (resistant 0-20 units) --Titrate regimen for inpatient goal 140-180  OSA (obstructive sleep apnea) --CPAP nightly  Essential hypertension --Continue home Toprol -XL, Imdur  --PRN hydralazine   --Titrate regimen  Obesity, Class III, BMI 40-49.9  Body mass index is 43.27 kg/m. Complicates overall care and prognosis.  Recommend lifestyle modifications including physical activity and diet for weight loss and overall long-term health.      Subjective: Pt seen up in recliner this AM, drowsy but awake. Reports intermittent foot/toe pain.  Denies fever/chills.  Reports fatigue and being generally weak but no other complaints.    Physical Exam: Vitals:   12/12/23 1650 12/12/23 2010 12/13/23 0442 12/13/23 0823  BP: (!) 131/59 125/68 (!) 145/73 132/60  Pulse: 76 77 67 73  Resp: 16 16 20    Temp: 97.8 F (36.6 C) 98.6 F (37 C)  (!) 97.4 F (36.3 C)  TempSrc: Oral     SpO2: 97% 97% 99% 92%  Weight:      Height:       General exam: awake, alert, talkative, no acute distress, obese  HEENT: moist mucus membranes, hearing grossly normal  Respiratory system: lungs clear but diminished, normal respiratory effort, on room air. Cardiovascular system: normal S1/S2, RRR, no pedal edema.   Gastrointestinal system: soft, NT, ND,  no HSM felt, +bowel sounds. Central nervous system: A&O x3. no gross focal neurologic deficits, normal speech Extremities: right foot guaze dressing in place with great toe erythema beginning to fade, swelling of the toe is slightly improved today Skin: dry, intact, no rashes seen, right foot as above Psychiatry: normal mood, congruent affect, judgement and insight appear normal   Data Reviewed:  Notable labs and studies ---   Hbg 8.5 >> 8.1 >> 8.0 >> 7.9 Platelets 119 >> 116 >> 110 >> 113k  Micro-- toe blister/abscess culture grew MRSA     Family Communication: son at bedside on rounds 5/29, 5/30, 5/31  Disposition: Status is: Inpatient Remains inpatient appropriate because: remains on empiric IV antibiotics pending cultures and further clinical improvement.      Planned Discharge Destination: SNF     Time spent: 35 minutes  Author: Montey Apa, DO 12/13/2023 11:11 AM  For on call review [email or web address].

## 2023-12-13 NOTE — Progress Notes (Signed)
 F/u right foot.  Still noted erythema and edema.  No fluctuance.  Superficial skin peeling.    Culture shows MRSA.  C/W antibiotics as outpt.  F/u with podiatry in 1 week.  Recommend daily dressings to foot with betadine gauze in 1st webspace and bulky dressing.

## 2023-12-13 NOTE — Plan of Care (Signed)
  Problem: Education: Goal: Ability to describe self-care measures that may prevent or decrease complications (Diabetes Survival Skills Education) will improve Outcome: Progressing Goal: Individualized Educational Video(s) Outcome: Progressing   Problem: Coping: Goal: Ability to adjust to condition or change in health will improve Outcome: Progressing   Problem: Fluid Volume: Goal: Ability to maintain a balanced intake and output will improve Outcome: Progressing   Problem: Health Behavior/Discharge Planning: Goal: Ability to identify and utilize available resources and services will improve Outcome: Progressing Goal: Ability to manage health-related needs will improve Outcome: Progressing   Problem: Metabolic: Goal: Ability to maintain appropriate glucose levels will improve Outcome: Progressing   Problem: Tissue Perfusion: Goal: Adequacy of tissue perfusion will improve Outcome: Progressing   Problem: Clinical Measurements: Goal: Diagnostic test results will improve Outcome: Progressing Goal: Signs and symptoms of infection will decrease Outcome: Progressing   Problem: Respiratory: Goal: Ability to maintain adequate ventilation will improve Outcome: Progressing   Problem: Clinical Measurements: Goal: Ability to maintain clinical measurements within normal limits will improve Outcome: Progressing Goal: Will remain free from infection Outcome: Progressing Goal: Diagnostic test results will improve Outcome: Progressing Goal: Respiratory complications will improve Outcome: Progressing Goal: Cardiovascular complication will be avoided Outcome: Progressing

## 2023-12-13 NOTE — Plan of Care (Signed)
  Problem: Education: Goal: Ability to describe self-care measures that may prevent or decrease complications (Diabetes Survival Skills Education) will improve 12/13/2023 1724 by Santita Hunsberger, RN Outcome: Progressing 12/13/2023 1530 by Hermenia Fritcher, RN Outcome: Progressing Goal: Individualized Educational Video(s) 12/13/2023 1724 by Ivon Oelkers, RN Outcome: Progressing 12/13/2023 1530 by Fremont Jest, RN Outcome: Progressing   Problem: Coping: Goal: Ability to adjust to condition or change in health will improve 12/13/2023 1724 by Fremont Jest, RN Outcome: Progressing 12/13/2023 1530 by Fremont Jest, RN Outcome: Progressing   Problem: Health Behavior/Discharge Planning: Goal: Ability to identify and utilize available resources and services will improve 12/13/2023 1724 by Fremont Jest, RN Outcome: Progressing 12/13/2023 1530 by Fremont Jest, RN Outcome: Progressing Goal: Ability to manage health-related needs will improve 12/13/2023 1724 by Marquesa Rath, RN Outcome: Progressing 12/13/2023 1530 by Fremont Jest, RN Outcome: Progressing   Problem: Metabolic: Goal: Ability to maintain appropriate glucose levels will improve 12/13/2023 1724 by Dema Timmons, RN Outcome: Progressing 12/13/2023 1530 by Fremont Jest, RN Outcome: Progressing   Problem: Nutritional: Goal: Maintenance of adequate nutrition will improve 12/13/2023 1724 by Tacy Chavis, RN Outcome: Progressing 12/13/2023 1530 by Cecille Mcclusky, RN Outcome: Progressing Goal: Progress toward achieving an optimal weight will improve 12/13/2023 1724 by Shanique Aslinger, RN Outcome: Progressing 12/13/2023 1530 by Litzy Dicker, RN Outcome: Progressing   Problem: Fluid Volume: Goal: Hemodynamic stability will improve 12/13/2023 1724 by Queena Monrreal, RN Outcome: Progressing 12/13/2023 1530 by Terrilee Dudzik, RN Outcome: Progressing   Problem: Clinical Measurements: Goal: Diagnostic test results will  improve 12/13/2023 1724 by Jerald Villalona, RN Outcome: Progressing 12/13/2023 1530 by Maley Venezia, RN Outcome: Progressing Goal: Signs and symptoms of infection will decrease 12/13/2023 1724 by Adelaide Pfefferkorn, RN Outcome: Progressing 12/13/2023 1530 by Sokhna Christoph, RN Outcome: Progressing   Problem: Respiratory: Goal: Ability to maintain adequate ventilation will improve 12/13/2023 1724 by Alieah Brinton, RN Outcome: Progressing 12/13/2023 1530 by Abiageal Blowe, RN Outcome: Progressing

## 2023-12-13 NOTE — Plan of Care (Signed)

## 2023-12-14 ENCOUNTER — Telehealth (HOSPITAL_COMMUNITY): Payer: Self-pay | Admitting: Pharmacy Technician

## 2023-12-14 ENCOUNTER — Other Ambulatory Visit (HOSPITAL_COMMUNITY): Payer: Self-pay

## 2023-12-14 ENCOUNTER — Inpatient Hospital Stay: Attending: Oncology

## 2023-12-14 ENCOUNTER — Encounter: Payer: Self-pay | Admitting: Oncology

## 2023-12-14 DIAGNOSIS — Z79899 Other long term (current) drug therapy: Secondary | ICD-10-CM | POA: Insufficient documentation

## 2023-12-14 DIAGNOSIS — D46Z Other myelodysplastic syndromes: Secondary | ICD-10-CM | POA: Insufficient documentation

## 2023-12-14 LAB — GLUCOSE, CAPILLARY
Glucose-Capillary: 163 mg/dL — ABNORMAL HIGH (ref 70–99)
Glucose-Capillary: 210 mg/dL — ABNORMAL HIGH (ref 70–99)
Glucose-Capillary: 164 mg/dL — ABNORMAL HIGH (ref 70–99)
Glucose-Capillary: 161 mg/dL — ABNORMAL HIGH (ref 70–99)

## 2023-12-14 NOTE — Discharge Instructions (Addendum)
 While on the antibiotic linezolid avoid or minimize following foods and drinks as they may interact with linezolid.  These foods and drinks contain tyramine.  Tyramine is a natural product found in some plants and animals.  Too much tyramine in combination with linezolid can cause high levels of serotonin in the body.  Serotonin is a chemical in our body that controls mood, sleep, digestion, and other functions.  Several signs of too much serotonin in the body (also called serotonin syndrome) are fast heart rate, sweating, fevers, high blood pressure, muscle twitching, and confusion.  It is important to seek medical attention if you have these symptoms.   Avoid foods and beverages that are high in tyramine while taking linezolid, including: Alcoholic beverages (such as beer, red wine, vermouth, sherry) Aged cheeses (such as cheddar, blue cheese, swiss, feta, parmesan, camembert) Fermented or pickled foods (such as sauerkraut, pickled beets, pickled peppers, pickled cucumbers/pickles, kim chee/kimchi)  Dried/aged, smoked or processed meats and sausages (such as salami, pepperoni, liverwurst, hot dogs, bologna, bacon) Soybean products (such as soy sauce, tofu) Preserved fish (such as pickled herring) Products that contain large amounts of yeast (such as bouillon cubes, powdered soup/gravy, homemade or sourdough bread)  Broad/fava beans  Following foods are OK to eat while taking linezolid: Pasteurized cheeses (such as american, ricotta, cottage cheese, cream cheese) Vegetables (not fermented or pickled) Non-cured or smoked meats     [facility] RN, Physical Therapy, Occupational Therapy [employer] to deliver wheelchair to the pt's room

## 2023-12-14 NOTE — Care Management Important Message (Signed)
 Important Message  Patient Details  Name: Jonathan Cross. MRN: [id number] Date of Birth: 1946/02/18   Important Message Given:  Yes - Medicare IM     Maris Abascal W, CMA 12/14/2023, 2:12 PM

## 2023-12-14 NOTE — Progress Notes (Signed)
 Physical Therapy Treatment Patient Details Name: Jonathan Cross. MRN: [id number] DOB: [birth year] Today's Date: 12/14/2023   History of Present Illness Jonathan Cross. is a 78 y.o. male that presented to ED with R foot pain/swelling, workup for cellulitis. 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 Pt required assistance for safety, obtained RW, CGA for transfer, and education on foot placement for improved use of RW. He ambulated ~31ft in room and able to have BM, supervision for pericare. He ambulated ~15ft in addition after toileting but endorsed SOB and fatigue. spO2 >95% throughout session on room air. Pt up in recliner with needs in reach. The patient would benefit from further skilled PT intervention to continue to progress towards goals.     If plan is discharge home, recommend the following: A lot of help with walking and/or transfers;A lot of help with bathing/dressing/bathroom;Assist for transportation;Assistance with cooking/housework;Help with stairs or ramp for entrance   Can travel by private vehicle     No  Equipment Recommendations  Other (comment) (TBD)    Recommendations for Other Services       Precautions / Restrictions Precautions Precautions: Fall Recall of Precautions/Restrictions: Impaired Restrictions Weight Bearing Restrictions Per Provider Order: No     Mobility  Bed Mobility               General bed mobility comments: pt up in recliner start/end of session    Transfers Overall transfer level: Needs assistance Equipment used: Rolling walker (2 wheels) Transfers: Sit to/from Stand Sit to Stand: Contact guard assist            General transfer comment: steadying assist    Ambulation/Gait Ambulation/Gait assistance: Contact guard assist Gait Distance (Feet): 40 Feet (seated rest break ~7ft for bathroom use) Assistive device: Rolling walker (2 wheels)         General Gait Details: limited distance due to fatigue and pain, pt reported SOB but spO2 >95% on room air throughout   Stairs             Wheelchair Mobility     Tilt Bed    Modified Rankin (Stroke Patients Only)       Balance Overall balance assessment: Needs assistance Sitting-balance support: No upper extremity supported, Feet supported Sitting balance-Leahy Scale: Good     Standing balance support: Bilateral upper extremity supported, Reliant on assistive device for balance Standing balance-Leahy Scale: Fair                              Communication    Cognition Arousal: Alert Behavior During Therapy: Flat affect                           PT - Cognition Comments: oriented to self, situation with foot   Following commands impaired: Follows one step commands with increased time    Cueing Cueing Techniques: Verbal cues, Gestural cues, Tactile cues  Exercises      General Comments        Pertinent Vitals/Pain Pain Assessment Pain Assessment: 0-10 Pain Score: 2  Pain Location: R foot Pain Descriptors / Indicators: Grimacing,  Moaning Pain Intervention(s): Limited activity within patient's tolerance, Monitored during session, Repositioned    Home Living                          Prior Function            PT Goals (current goals can now be found in the care plan section) Progress towards PT goals: Progressing toward goals    Frequency    Min 2X/week      PT Plan      Co-evaluation              AM-PAC PT "6 Clicks" Mobility   Outcome Measure  Help needed turning from your back to your side while in a flat bed without using bedrails?: A Little Help needed  moving from lying on your back to sitting on the side of a flat bed without using bedrails?: A Little Help needed moving to and from a bed to a chair (including a wheelchair)?: A Little Help needed standing up from a chair using your arms (e.g., wheelchair or bedside chair)?: A Little Help needed to walk in hospital room?: A Little Help needed climbing 3-5 steps with a railing? : Total 6 Click Score: 16    End of Session Equipment Utilized During Treatment: Gait belt Activity Tolerance: Patient limited by fatigue Patient left: in chair;with call bell/phone within reach;with chair alarm set Nurse Communication: Mobility status PT Visit Diagnosis: Other abnormalities of gait and mobility (R26.89);Difficulty in walking, not elsewhere classified (R26.2);Muscle weakness (generalized) (M62.81)     Time: 9147-8295 PT Time Calculation (min) (ACUTE ONLY): 15 min  Charges:    $Therapeutic Activity: 8-22 mins PT General Charges $$ ACUTE PT VISIT: 1 Visit                     Darien Eden PT, DPT 10:18 AM,12/14/23

## 2023-12-14 NOTE — Telephone Encounter (Signed)
 Patient Product/process development scientist completed.    The patient is insured through [employer]. Patient has Medicare and is not eligible for a copay card, but may be able to apply for patient assistance or Medicare RX Payment Plan (Patient Must reach out to their plan, if eligible for payment plan), if available.    Ran test claim for linezolid 600 mg and the current 10 day co-pay is $24.56.   This test claim was processed through [facility]- copay amounts may vary at other pharmacies due to pharmacy/plan contracts, or as the patient moves through the different stages of their insurance plan.     Morgan Arab, CPHT Pharmacy Technician III Certified Patient Advocate [facility] Patient Advocate Team Direct Number: [phone]  Fax: [phone]

## 2023-12-14 NOTE — Discharge Summary (Signed)
 Physician Discharge Summary   Patient: Jonathan Cross. MRN: [id number] DOB: 11/19/1945  Admit date:     12/09/2023  Discharge date: 12/15/2023  Discharge Physician: Montey Apa   PCP: Lucina Sabal, MD   Recommendations at discharge:    Follow up with Podiatry Follow up with Primary Care Repeat CBC, CMP at follow up  Discharge Diagnoses: Principal Problem:   Sepsis (HCC) Active Problems:   Uncontrolled type 2 diabetes mellitus with hyperglycemia, with long-term current use of insulin  (HCC)   Paroxysmal atrial fibrillation/A-flutter (HCC)   Status post coronary artery bypass graft   Benign prostatic hyperplasia   Essential hypertension   OSA (obstructive sleep apnea)   Type 2 diabetes mellitus without complication, with long-term current use of insulin  (HCC)   GERD (gastroesophageal reflux disease)   Atrial fibrillation (HCC)   Myelodysplastic syndrome (HCC)   Obesity, Class III, BMI 40-49.9 (morbid obesity)  Resolved Problems:   * No resolved hospital problems. [facility] Course:  Mr. Hughie, Melroy. is an 78 year old male with history of insulin -dependent diabetes mellitus, hypertension, GERD, hyperlipidemia, who presents emergency department for chief concerns of fever, cough, chills... right foot pain and general malaise x 3 days See H&P for full HPI on admission & ED course.   Patient was admitted for further evaluation and management of sepsis due to right foot cellulitis and suspected UTI, started on empiric IV antibiotics pending culture and MRI of the right foot to assess for underlying osteomyelitis.   Further hospital course and management as outlined below.   6/3 -- Pt denies complaints besides weakness.  Pain in his foot is slowly improving. Pt medically stable for discharge with home health today.  Family updated and in agreement.     Assessment and Plan:  *Severe Sepsis -- POA with tachypnea, tachycardia, leukocytosis, lactic  acidosis consistent with organ dysfunction Right foot cellulitis - osteomyelitis ruled out.  Small abscess vs blister in 1st web space was drained by podiatry 5/29.  MRI right foot negative for osteomyelitis Pyuria, Bacteruria -- POA with UA large leukocytes, > 50 WBC's, rare bacteria.  Urine culture - No Growth.  UTI ruled out. Initially started on IV Vanc/Cefepime  >> Vanc/Rocephin  5/30 -- recurrent fever 100.5 F this AM 5/31 -- afebrile, early improvement in right great toe swelling and erythema 6/1 -- afebrile, foot abscess/blister culture +MRSA, transitioned to PO Zyvox  --Podiatry consulted on admission, I&D'd right foot blister 5/29 --Culture grew MRSA, no anaerobes --Transition IV Vancomycin /Rocephin  >> PO Zyvox  x 5 more days (10 day course) --Negative blood cultures  --Right foot dressing change daily - per podiatry --HH RN to assist --Pain control  PRN   Generalized weakness --PT/OT evaluations - discharging with [facility] PT/OT --Patient discharged home from SNF/rehab on May 4th per son - unable to return due to high co-pays being cost-prohibitive --Fall precautions   Macrocytic anemia -- Hbg 9.9 on admission, pt got sepsis IV fluids. Hbg trend 9.9 >> 8.5 >> 8.1 >> 8.0.  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 Platelets 136 >> 119 >> 116 >> 110k --Monitor CBC --Low threshold to hold Lovenox  if plt's continue to trend down   Myelodysplastic syndrome  With baseline chest pain that is unchanged --Monitor   Type 2 diabetes mellitus without complication, with long-term current use of insulin  (HCC) Patient takes NPH 100 units twice daily CBG's overall at inpatient goal --Semglee  50 units  at bedtime --Sliding scale Novolog  AC/HS (resistant 0-20 units) --Titrate regimen for inpatient goal 140-180   OSA (obstructive sleep apnea) --CPAP nightly   Essential hypertension --Continue home Toprol -XL,  Imdur  --PRN hydralazine   --Titrate regimen   Obesity, Class III, BMI 40-49.9  Body mass index is 43.27 kg/m. Complicates overall care and prognosis.  Recommend lifestyle modifications including physical activity and diet for weight loss and overall long-term health.         Consultants: Podiatry Procedures performed: bedside I&D by Podiatry  Disposition: Home health Diet recommendation:  Cardiac and Carb modified diet DISCHARGE MEDICATION: Allergies as of 12/15/2023       Reactions   Metformin Diarrhea, Anaphylaxis   Loose stools even with XR        Medication List     STOP taking these medications    aspirin  EC 81 MG tablet   cefadroxil 500 MG capsule Commonly known as: DURICEF   fluconazole  100 MG tablet Commonly known as: DIFLUCAN        TAKE these medications    Acetaminophen  Extra Strength 500 MG Tabs Take 2 tablets (1,000 mg total) by mouth every 8 (eight) hours. What changed:  when to take this reasons to take this   atorvastatin  80 MG tablet Commonly known as: LIPITOR  Take 1 tablet (80 mg total) by mouth every evening.   furosemide  40 MG tablet Commonly known as: LASIX  Take 1 tablet (40 mg total) by mouth 2 (two) times daily. Take 2 tablets or 80 mg in the morning and 40 mg in afternoon   hydrOXYzine 10 MG tablet Commonly known as: ATARAX Take 10 mg by mouth 3 (three) times daily as needed for itching.   insulin  NPH-regular Human (70-30) 100 UNIT/ML injection Inject 100 Units into the skin 2 (two) times daily.   isosorbide  mononitrate 30 MG 24 hr tablet Commonly known as: IMDUR  Take 0.5 tablets (15 mg total) by mouth daily.   meclizine  12.5 MG tablet Commonly known as: ANTIVERT  Take 12.5 mg by mouth every 8 (eight) hours as needed.   metoprolol  succinate 25 MG 24 hr tablet Commonly known as: TOPROL -XL Take 1 tablet (25 mg total) by mouth 2 (two) times daily. What changed: when to take this   nitroGLYCERIN  0.4 MG SL tablet Commonly  known as: NITROSTAT  Place 1 tablet (0.4 mg total) under the tongue every 5 (five) minutes x 3 doses as needed for chest pain.   omeprazole  20 MG capsule Commonly known as: PRILOSEC Take 2 capsules by mouth once daily   oxyCODONE  5 MG immediate release tablet Commonly known as: Oxy IR/ROXICODONE  Take 0.5-1 tablets (2.5-5 mg total) by mouth every 4 (four) hours as needed for severe pain (pain score 7-10) or moderate pain (pain score 4-6).   polyethylene glycol powder 17 GM/SCOOP powder Commonly known as: GLYCOLAX /MIRALAX  Take 17 g by mouth daily.   ranolazine  500 MG 12 hr tablet Commonly known as: RANEXA  Take 1 tablet by mouth twice daily   Stool Softener/Laxative 50-8.6 MG tablet Generic drug: senna-docusate Take 1 tablet by mouth 2 (two) times daily.   tamsulosin  0.4 MG Caps capsule Commonly known as: FLOMAX  Take 0.4 mg by mouth daily.       ASK your doctor about these medications    linezolid  600 MG tablet Commonly known as: ZYVOX  Take 1 tablet (600 mg total) by mouth every 12 (twelve) hours for 5 days. Ask about: Should I take this medication?  Discharge Care Instructions  (From admission, onward)           Start     Ordered   12/15/23 0000  Discharge wound care:       Comments: Per Dr. Althea Atkinson: daily dressings to foot with betadine gauze in 1st webspace and bulky dressing   12/15/23 1030            Follow-up Information     Lucina Sabal, MD Follow up.   Specialty: Internal Medicine Why: Hospital follow up Contact information: [address] [place] [address] [phone]         [facility] Follow up.   Specialty: Home Health Services Why: 06/03--K[facility]sey, is following for home health RN/PT/OT services Contact information: [address] [place] [address] [phone]         [facility] Follow up.   Why: 06/03---Order for wheelchair forwarded to [employer]  for processing to room delivery Contact information: [address] [place] [address] [phone]         Anell Baptist, DPM. Schedule an appointment as soon as possible for a visit today.   Specialty: Podiatry Why: Call for follow up appointment for right foot infection. Contact information: [address] [place] [address] [phone]                Discharge Exam: Filed Weights   12/09/23 1632  Weight: 117.9 kg   General exam: awake, alert, no acute distress HEENT: moist mucus membranes, hearing grossly normal  Respiratory system: CTAB generally diminished, no wheezes, rales or rhonchi, normal respiratory effort. Cardiovascular system: normal S1/S2, RRR   Gastrointestinal system: soft, NT, ND Central nervous system: A&O x 3. no gross focal neurologic deficits, normal speech Extremities: right foot dressing clean dry intact Psychiatry: normal mood, congruent affect, judgement and insight appear normal   Condition at discharge: stable  The results of significant diagnostics from this hospitalization (including imaging, microbiology, ancillary and laboratory) are listed below for reference.   Imaging Studies: MR FOOT RIGHT WO CONTRAST Result Date: 12/10/2023 CLINICAL DATA:  Soft tissue infection suspected, foot, xray done EXAM: MRI OF THE RIGHT FOREFOOT WITHOUT CONTRAST TECHNIQUE: Multiplanar, multisequence MR imaging of the right foot was performed. No intravenous contrast was administered. COMPARISON:  Right foot radiographs dated 12/09/2023. FINDINGS: Bones/Joint/Cartilage No marrow signal abnormality. No fracture or dislocation. Normal alignment. No joint effusion. Moderate first MTP joint space narrowing and marginal osteophytosis. Mild joint space narrowing of second through fifth MTP joints. Diffuse interphalangeal joint space narrowing. Mild degenerative changes of the TMT joints. Ligaments Collateral ligaments are intact.  Lisfranc ligament is  intact. Muscles and Tendons Flexor and extensor compartment tendons are intact. No tenosynovitis. Severe fatty atrophy of the intrinsic musculature of the foot, may reflect chronic denervation changes. Soft tissue An 8 x 3 mm cutaneous T2 hyperintense focus in the first webspace, at the lateral base of the great toe, could represent a blister. There is underlying soft tissue edema of the great toe with cutaneous thickening. Nonspecific subcutaneous edema extending along the dorsal foot. No loculated subcutaneous soft tissue fluid collection identified. IMPRESSION: 1. An 8 x 3 mm fluid signal focus in the first webspace, at the lateral base of the great toe, may represent a blister. Underlying soft tissue edema of the great toe with cutaneous thickening is concerning for cellulitis. 2. No marrow signal abnormality identified to suggest osteomyelitis. 3. Mild-to-moderate degenerative changes of the  foot, most pronounced at the first MTP joint. 4. Severe fatty atrophy of the intrinsic musculature of the foot, may reflect chronic denervation changes. Electronically Signed   By: Mannie Seek M.D.   On: 12/10/2023 11:40   DG Foot Complete Right Result Date: 12/09/2023 CLINICAL DATA:  Foot wound EXAM: RIGHT FOOT COMPLETE - 3+ VIEW COMPARISON:  None Available. FINDINGS: No fracture or malalignment. Vascular calcifications. No soft tissue emphysema or osseous destructive change. Degenerative changes at the first MTP joint. Small plantar and posterior calcaneal spurs. No soft tissue emphysema IMPRESSION: No acute osseous abnormality. Electronically Signed   By: Esmeralda Hedge M.D.   On: 12/09/2023 17:46   DG Chest Port 1 View Result Date: 12/09/2023 CLINICAL DATA:  Suspected sepsis EXAM: PORTABLE CHEST 1 VIEW COMPARISON:  08/24/2023 FINDINGS: Sternotomy. No acute airspace disease or pleural effusion. Stable cardiomediastinal silhouette. No pneumothorax IMPRESSION: No active disease. Electronically Signed   By: Esmeralda Hedge M.D.   On: 12/09/2023 17:45    Microbiology: Results for orders placed or performed during the hospital encounter of 12/09/23  Culture, blood (Routine x 2)     Status: None   Collection Time: 12/09/23  4:37 PM   Specimen: BLOOD  Result Value Ref Range Status   Specimen Description   Final    BLOOD LEFT ANTECUBITAL Performed at [facility], [address] [place], [place] [address]    Special Requests   Final    BOTTLES DRAWN AEROBIC AND ANAEROBIC Blood Culture results may not be optimal due to an inadequate volume of blood received in culture bottles Performed at [facility], [address] [place], [place] [address]    Culture   Final    NO GROWTH 8 DAYS Performed at [facility], [address] [place], [place] [address]    Report Status 12/17/2023 FINAL  Final  Culture, blood (Routine x 2)     Status: None   Collection Time: 12/09/23  4:37 PM   Specimen: BLOOD  Result Value Ref Range Status   Specimen Description   Final    BLOOD RIGHT ANTECUBITAL Performed at [facility], [address] [place], [place] [address]    Special Requests   Final    BOTTLES DRAWN AEROBIC AND ANAEROBIC Blood Culture results may not be optimal due to an inadequate volume of blood received in culture bottles Performed at [facility], [address], [place], [place] [address]    Culture   Final    NO GROWTH 8 DAYS Performed at [facility], [address], [place], [place] [address]    Report Status 12/17/2023 FINAL  Final  Resp panel by RT-PCR (RSV, Flu A&B, Covid) Anterior Nasal Swab     Status: None   Collection Time: 12/09/23  4:37 PM   Specimen: Anterior Nasal Swab  Result Value Ref Range Status   SARS Coronavirus 2 by RT PCR NEGATIVE NEGATIVE Final    Comment: (NOTE) SARS-CoV-2 target nucleic acids are NOT DETECTED.  The SARS-CoV-2 RNA is generally detectable in upper respiratory specimens during the acute phase of  infection. The lowest concentration of SARS-CoV-2 viral copies this assay can detect is 138 copies/mL. A negative result does not preclude SARS-Cov-2 infection and should not be used as the sole basis for treatment or other patient management decisions. A negative result may occur with  improper specimen collection/handling, submission of specimen other than nasopharyngeal swab, presence of viral mutation(s) within the  areas targeted by this assay, and inadequate number of viral copies(<138 copies/mL). A negative result must be combined with clinical observations, patient history, and epidemiological information. The expected result is Negative.  Fact Sheet for Patients:  BloggerCourse.com  Fact Sheet for Healthcare Providers:  SeriousBroker.it  This test is no t yet approved or cleared by the [place]  FDA and  has been authorized for detection and/or diagnosis of SARS-CoV-2 by FDA under an Emergency Use Authorization (EUA). This EUA will remain  in effect (meaning this test can be used) for the duration of the COVID-19 declaration under Section 564(b)(1) of the Act, 21 U.S.C.section 360bbb-3(b)(1), unless the authorization is terminated  or revoked sooner.       Influenza A by PCR NEGATIVE NEGATIVE Final   Influenza B by PCR NEGATIVE NEGATIVE Final    Comment: (NOTE) The Xpert Xpress SARS-CoV-2/FLU/RSV plus assay is intended as an aid in the diagnosis of influenza from Nasopharyngeal swab specimens and should not be used as a sole basis for treatment. Nasal washings and aspirates are unacceptable for Xpert Xpress SARS-CoV-2/FLU/RSV testing.  Fact Sheet for Patients: BloggerCourse.com  Fact Sheet for Healthcare Providers: SeriousBroker.it  This test is not yet approved or cleared by the [place]  FDA and has been authorized for detection and/or diagnosis of SARS-CoV-2  by FDA under an Emergency Use Authorization (EUA). This EUA will remain in effect (meaning this test can be used) for the duration of the COVID-19 declaration under Section 564(b)(1) of the Act, 21 U.S.C. section 360bbb-3(b)(1), unless the authorization is terminated or revoked.     Resp Syncytial Virus by PCR NEGATIVE NEGATIVE Final    Comment: (NOTE) Fact Sheet for Patients: BloggerCourse.com  Fact Sheet for Healthcare Providers: SeriousBroker.it  This test is not yet approved or cleared by the [place]  FDA and has been authorized for detection and/or diagnosis of SARS-CoV-2 by FDA under an Emergency Use Authorization (EUA). This EUA will remain in effect (meaning this test can be used) for the duration of the COVID-19 declaration under Section 564(b)(1) of the Act, 21 U.S.C. section 360bbb-3(b)(1), unless the authorization is terminated or revoked.  Performed at [facility], [address] [place], [place] [address]   Urine Culture     Status: None   Collection Time: 12/09/23  8:25 PM   Specimen: Urine, Clean Catch  Result Value Ref Range Status   Specimen Description   Final    URINE, CLEAN CATCH Performed at [facility], [address], [place], [place] [address]    Special Requests   Final    NONE Reflexed from [phone] Performed at [facility], [address] [place] [place], [place] [address]    Culture   Final    NO GROWTH Performed at [facility], [address] [place] [place], [place] [address]    Report Status 12/10/2023 FINAL  Final  Aerobic/Anaerobic Culture w Gram Stain (surgical/deep wound)     Status: None   Collection Time: 12/10/23 12:32 PM   Specimen: Toe; Abscess  Result Value Ref Range Status   Specimen Description   Final    TOE Performed at [facility], [address] [place], [place] [address]    Special Requests   Final    BLISTER RIGHT GREAT  TOE Performed at [facility], [address] [place], [place] [address]    Gram Stain NO WBC SEEN NO ORGANISMS SEEN   Final   Culture   Final  RARE METHICILLIN RESISTANT STAPHYLOCOCCUS AUREUS NO ANAEROBES ISOLATED Performed at [facility], [address], [place], [place] [address]    Report Status 12/15/2023 FINAL  Final   Organism ID, Bacteria METHICILLIN RESISTANT STAPHYLOCOCCUS AUREUS  Final      Susceptibility   Methicillin resistant staphylococcus aureus - MIC*    CIPROFLOXACIN  >=8 RESISTANT Resistant     ERYTHROMYCIN >=8 RESISTANT Resistant     GENTAMICIN  <=0.5 SENSITIVE Sensitive     OXACILLIN >=4 RESISTANT Resistant     TETRACYCLINE <=1 SENSITIVE Sensitive     VANCOMYCIN  1 SENSITIVE Sensitive     TRIMETH/SULFA <=10 SENSITIVE Sensitive     CLINDAMYCIN <=0.25 SENSITIVE Sensitive     RIFAMPIN <=0.5 SENSITIVE Sensitive     Inducible Clindamycin NEGATIVE Sensitive     LINEZOLID  2 SENSITIVE Sensitive     * RARE METHICILLIN RESISTANT STAPHYLOCOCCUS AUREUS    Labs: CBC: Recent Labs  Lab 12/21/23 1411  WBC 3.8*  NEUTROABS 2.0  HGB 8.7*  HCT 27.6*  MCV 113.6*  PLT 198   Basic Metabolic Panel: No results for input(s): NA, K, CL, CO2, GLUCOSE, BUN, CREATININE, CALCIUM , MG, PHOS in the last 168 hours.  Liver Function Tests: No results for input(s): AST, ALT, ALKPHOS, BILITOT, PROT, ALBUMIN in the last 168 hours.  CBG: No results for input(s): GLUCAP in the last 168 hours.   Discharge time spent: greater than 30 minutes.  Signed: Montey Apa, DO [facility] 12/27/2023

## 2023-12-15 ENCOUNTER — Other Ambulatory Visit: Payer: Self-pay

## 2023-12-15 ENCOUNTER — Encounter: Payer: Self-pay | Admitting: Oncology

## 2023-12-15 DIAGNOSIS — E119 Type 2 diabetes mellitus without complications: Secondary | ICD-10-CM | POA: Diagnosis not present

## 2023-12-15 DIAGNOSIS — I480 Paroxysmal atrial fibrillation: Secondary | ICD-10-CM | POA: Diagnosis not present

## 2023-12-15 DIAGNOSIS — E1165 Type 2 diabetes mellitus with hyperglycemia: Secondary | ICD-10-CM | POA: Diagnosis not present

## 2023-12-15 DIAGNOSIS — G4733 Obstructive sleep apnea (adult) (pediatric): Secondary | ICD-10-CM | POA: Diagnosis not present

## 2023-12-15 LAB — GLUCOSE, CAPILLARY
Glucose-Capillary: 112 mg/dL — ABNORMAL HIGH (ref 70–99)
Glucose-Capillary: 118 mg/dL — ABNORMAL HIGH (ref 70–99)
Glucose-Capillary: 170 mg/dL — ABNORMAL HIGH (ref 70–99)
Glucose-Capillary: 170 mg/dL — ABNORMAL HIGH (ref 70–99)

## 2023-12-15 LAB — AEROBIC/ANAEROBIC CULTURE W GRAM STAIN (SURGICAL/DEEP WOUND): Gram Stain: NONE SEEN

## 2023-12-15 MED ORDER — OXYCODONE HCL 5 MG PO TABS
2.5000 mg | ORAL_TABLET | ORAL | 0 refills | Status: DC | PRN
  Filled 2023-12-15: qty 20, 4d supply, fill #0

## 2023-12-15 MED ORDER — LINEZOLID 600 MG PO TABS
600.0000 mg | ORAL_TABLET | Freq: Two times a day (BID) | ORAL | 0 refills | Status: AC
  Filled 2023-12-15: qty 10, 5d supply, fill #0

## 2023-12-15 MED ORDER — POLYETHYLENE GLYCOL 3350 17 GM/SCOOP PO POWD
17.0000 g | Freq: Every day | ORAL | 0 refills | Status: AC
  Filled 2023-12-15: qty 238, 14d supply, fill #0

## 2023-12-15 MED ORDER — ACETAMINOPHEN 500 MG PO TABS
1000.0000 mg | ORAL_TABLET | Freq: Three times a day (TID) | ORAL | 0 refills | Status: AC
  Filled 2023-12-15: qty 30, 5d supply, fill #0

## 2023-12-15 MED ORDER — SENNOSIDES-DOCUSATE SODIUM 8.6-50 MG PO TABS
1.0000 | ORAL_TABLET | Freq: Two times a day (BID) | ORAL | 0 refills | Status: DC
  Filled 2023-12-15: qty 30, 15d supply, fill #0

## 2023-12-15 NOTE — TOC Transition Note (Addendum)
 Transition of Care [facility]) - Discharge Note   Patient Details  Name: Jonathan Cross. MRN: [id number] Date of Birth: 1945/07/28  Transition of Care [facility]) CM/SW Contact:  Crayton Docker, RN 12/15/2023, 10:28 AM   Clinical Narrative:     Discharge orders noted. Patient to discharge to home with home health RN/PT/OT and DME; wheelchair. Home health orders noted. DME order noted for wheelchair. CM call to [facility], phone: [phone] regarding home health orders for RN/PT/OT. Per Verdis Glade, will continue to follow for resumption of care services. CM secure message to Sam Creighton, [employer] regarding DME order for wheelchair for room delivery. CM call to patient's son, Suszanne Eriksson regarding pending discharge. Per patient's son, Suszanne Eriksson will call CM back, although will pick up patient later today. CM alert to Dr. Antoniette Batty regarding patient's son, Suszanne Eriksson will pick up patient later today.   Call received from patient's son, Suszanne Eriksson regarding pending discharge. CM and patient's son, discussed portable oxygen for discharge. Per patient's son, patient has a portable oxygen tank and need to check portable oxygen tank to see if it is empty and call CM back.  Secure message received from Sam Creighton, [employer] regarding wheelchair has an out of pocket cost and patient's daughter was notified.  Voicemail message received from patient's son, Suszanne Eriksson regarding empty home oxygen tank. Per patient's son, request for new portable oxygen tank and nasal cannula. CM call to patient's son, regarding portable oxygen request and wheelchair. Per patient's son, Suszanne Eriksson will purchase wheelchair from another vendor and will pick up patient when portable oxygen arrives.   New updated DME orders for 3 in 1 BSC. CM alert to Dr. Antoniette Batty regarding DME order. CM alert to Sam Creighton, [employer] regarding 3 in 1 [facility] for delivery.   Per therapy, recommendations are for 3 in 1 bedside commode due to patient's recent decline in his functional  status. Per therapy, patient requires a lot of help with walking and or transfers and a lot of help with bathing/dressing/bathroom, requiring a 3 in 1 BSC for bathroom and bathing assistance.   Patient is confined to one room and or floor with no access to a bathroom requiring a need for a commode due to generalized weakness and right foot cellulitis.   Final next level of care: Home w Home Health Services Barriers to Discharge: No Barriers Identified   Patient Goals and CMS Choice    Home health RN/PT/OT; [facility]  Discharge Placement    Home health RN/PT/OT DME: Wheelchair       Discharge Plan and Services Additional resources added to the After Visit Summary for     DME: wheelchair, manual Date DME Agency Contacted: 12/15/23 Time DME Agency Contacted: 1027 Representative spoke with at DME Agency: Sam Creighton HH Arranged: RN, PT, OT [facility] Agency: [facility] Date [facility] Agency Contacted: 12/15/23 Time HH Agency Contacted: 1027 Representative spoke with at [facility] Agency: Verdis Glade  Social Drivers of Health (SDOH) Interventions SDOH Screenings   Food Insecurity: No Food Insecurity (12/09/2023)  Housing: Low Risk  (12/09/2023)  Transportation Needs: No Transportation Needs (12/09/2023)  Utilities: Not At Risk (12/09/2023)  Financial Resource Strain: Low Risk  (11/03/2023)   Received from [facility]  Physical Activity: Inactive (04/24/2021)   Received from [facility], [facility]  Social Connections: Socially Isolated (12/09/2023)  Stress: No Stress Concern Present (04/24/2021)   Received from [facility], [facility]  Tobacco Use: Medium Risk (12/09/2023)  Health  Literacy: High Risk (02/25/2023)   Received from [facility]     Readmission Risk Interventions     No data to display

## 2023-12-15 NOTE — Progress Notes (Signed)
 TOC advised via secure chat that the pt refused the DME/wheelchair due to monetary out of pocket issues

## 2023-12-15 NOTE — Progress Notes (Signed)
 Patient placed on full face CPAP with 3L oxygen.  Patient tolerating well at this time.

## 2023-12-15 NOTE — Plan of Care (Signed)
  Problem: Education: Goal: Ability to describe self-care measures that may prevent or decrease complications (Diabetes Survival Skills Education) will improve Outcome: Adequate for Discharge Goal: Individualized Educational Video(s) Outcome: Adequate for Discharge   Problem: Coping: Goal: Ability to adjust to condition or change in health will improve Outcome: Adequate for Discharge   Problem: Fluid Volume: Goal: Ability to maintain a balanced intake and output will improve Outcome: Adequate for Discharge   Problem: Health Behavior/Discharge Planning: Goal: Ability to identify and utilize available resources and services will improve Outcome: Adequate for Discharge Goal: Ability to manage health-related needs will improve Outcome: Adequate for Discharge   Problem: Metabolic: Goal: Ability to maintain appropriate glucose levels will improve Outcome: Adequate for Discharge   Problem: Nutritional: Goal: Maintenance of adequate nutrition will improve Outcome: Adequate for Discharge Goal: Progress toward achieving an optimal weight will improve Outcome: Adequate for Discharge   Problem: Skin Integrity: Goal: Risk for impaired skin integrity will decrease Outcome: Adequate for Discharge   Problem: Tissue Perfusion: Goal: Adequacy of tissue perfusion will improve Outcome: Adequate for Discharge   Problem: Fluid Volume: Goal: Hemodynamic stability will improve Outcome: Adequate for Discharge   Problem: Clinical Measurements: Goal: Diagnostic test results will improve Outcome: Adequate for Discharge Goal: Signs and symptoms of infection will decrease Outcome: Adequate for Discharge   Problem: Respiratory: Goal: Ability to maintain adequate ventilation will improve Outcome: Adequate for Discharge   Problem: Education: Goal: Knowledge of General Education information will improve Description: Including pain rating scale, medication(s)/side effects and non-pharmacologic  comfort measures Outcome: Adequate for Discharge   Problem: Health Behavior/Discharge Planning: Goal: Ability to manage health-related needs will improve Outcome: Adequate for Discharge   Problem: Clinical Measurements: Goal: Ability to maintain clinical measurements within normal limits will improve Outcome: Adequate for Discharge Goal: Will remain free from infection Outcome: Adequate for Discharge Goal: Diagnostic test results will improve Outcome: Adequate for Discharge Goal: Respiratory complications will improve Outcome: Adequate for Discharge Goal: Cardiovascular complication will be avoided Outcome: Adequate for Discharge   Problem: Activity: Goal: Risk for activity intolerance will decrease Outcome: Adequate for Discharge   Problem: Nutrition: Goal: Adequate nutrition will be maintained Outcome: Adequate for Discharge   Problem: Coping: Goal: Level of anxiety will decrease Outcome: Adequate for Discharge   Problem: Elimination: Goal: Will not experience complications related to bowel motility Outcome: Adequate for Discharge Goal: Will not experience complications related to urinary retention Outcome: Adequate for Discharge   Problem: Pain Managment: Goal: General experience of comfort will improve and/or be controlled Outcome: Adequate for Discharge   Problem: Safety: Goal: Ability to remain free from injury will improve Outcome: Adequate for Discharge   Problem: Skin Integrity: Goal: Risk for impaired skin integrity will decrease Outcome: Adequate for Discharge   Problem: Acute Rehab OT Goals (only OT should resolve) Goal: Pt. Will Perform Lower Body Dressing Outcome: Adequate for Discharge Goal: Pt. Will Transfer To Toilet Outcome: Adequate for Discharge Goal: Pt. Will Perform Toileting-Clothing Manipulation Outcome: Adequate for Discharge Goal: OT Additional ADL Goal #1 Outcome: Adequate for Discharge   Problem: Acute Rehab PT Goals(only PT  should resolve) Goal: Pt Will Go Supine/Side To Sit Outcome: Adequate for Discharge Goal: Patient Will Transfer Sit To/From Stand Outcome: Adequate for Discharge Goal: Pt Will Ambulate Outcome: Adequate for Discharge Goal: Pt Will Go Up/Down Stairs Outcome: Adequate for Discharge

## 2023-12-15 NOTE — Progress Notes (Signed)
 Occupational Therapy Treatment Patient Details Name: Jonathan Cross. MRN: [id number] DOB: 10-Nov-1945 Today's Date: 12/15/2023   History of present illness Jonathan Cross. is a 78 y.o. male that presented to ED with R foot pain/swelling, workup for cellulitis. 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 Plan for pt to go home later today. OT located pt's clothing in room. Pt demonstrates figure four position and threads pants onto B feet while seated and min guard for balance to pull over B hips. Pt then reports he thinks they are dirty and wants to remove them. Pt able to doff with supervision. RN arrives to provide care so therapist exits the room. Call bell and all needed items within reach.       If plan is discharge home, recommend the following:  A little help with walking and/or transfers;Assistance with cooking/housework;Assist for transportation;Direct supervision/assist for financial management;Direct supervision/assist for medications management;Supervision due to cognitive status;Help with stairs or ramp for entrance;A little help with bathing/dressing/bathroom   Equipment Recommendations  BSC/3in1;Other (comment) (RW)       Precautions / Restrictions Precautions Precautions: Fall Recall of Precautions/Restrictions: Impaired       Mobility Bed Mobility               General bed mobility comments: pt up in recliner start/end of session    Transfers Overall transfer level: Needs assistance Equipment used: Rolling walker (2 wheels) Transfers: Sit to/from Stand Sit to Stand: Contact guard assist                 Balance Overall balance  assessment: Needs assistance Sitting-balance support: No upper extremity supported, Feet supported Sitting balance-Leahy Scale: Good     Standing balance support: Bilateral upper extremity supported, Reliant on assistive device for balance Standing balance-Leahy Scale: Fair                             ADL either performed or assessed with clinical judgement   ADL Overall ADL's : Needs assistance/impaired                     Lower Body Dressing: Contact guard assist Lower Body Dressing Details (indicate cue type and reason): Pt able to thread pants while seated and dons with CGA in standing                    Extremity/Trunk Assessment Upper Extremity Assessment Upper Extremity Assessment: Generalized weakness            Vision Patient Visual Report: No change from baseline           Communication Communication Communication: Impaired   Cognition Arousal: Alert Behavior During Therapy: Flat affect Cognition: No apparent impairments                               Following commands: Impaired Following commands impaired: Follows one step commands with increased time      Cueing   Cueing Techniques: Verbal cues, Gestural cues, Tactile cues  Pertinent Vitals/ Pain       Pain Assessment Pain Assessment: Faces Faces Pain Scale: Hurts a little bit Pain Location: R foot Pain Descriptors / Indicators: Discomfort Pain Intervention(s): Limited activity within patient's tolerance, Monitored during session, Repositioned         Frequency  Min 2X/week        Progress Toward Goals  OT Goals(current goals can now be found in the care plan section)  Progress towards OT goals: Progressing toward goals      AM-PAC OT "6 Clicks" Daily Activity     Outcome Measure   Help from another person eating meals?: None Help from another person taking care of personal grooming?: A Little Help from another person  toileting, which includes using toliet, bedpan, or urinal?: A Little Help from another person bathing (including washing, rinsing, drying)?: A Little Help from another person to put on and taking off regular upper body clothing?: None Help from another person to put on and taking off regular lower body clothing?: A Little 6 Click Score: 20    End of Session Equipment Utilized During Treatment: Oxygen;Rolling walker (2 wheels)  OT Visit Diagnosis: Other abnormalities of gait and mobility (R26.89);Muscle weakness (generalized) (M62.81)   Activity Tolerance Patient tolerated treatment well;Patient limited by pain   Patient Left with call bell/phone within reach;with chair alarm set;in chair;with nursing/sitter in room   Nurse Communication Mobility status        Time: 1610-9604 OT Time Calculation (min): 14 min  Charges: OT General Charges $OT Visit: 1 Visit OT Treatments $Self Care/Home Management : 8-22 mins  George Kinder, MS, OTR/L , CBIS ascom [phone]  12/15/23, 12:56 PM

## 2023-12-15 NOTE — Progress Notes (Signed)
 MD order received in [facility] to discharge pt home with home health RN, PT and OT as well as DME/wheelchair today; TOC previously established services for home health with [facility] and DME with [employer]; verbally reviewed AVS with pt, [facility] will deliver filled Rxs to Room 128 prior to discharge, no questions voiced by the pt at this time; pt's discharge pending delivery of a wheelchair to his room to take home with him and the arrival of his son, who is working today; pt verbalized that his son does not get off of work til 4p

## 2023-12-16 ENCOUNTER — Inpatient Hospital Stay

## 2023-12-16 ENCOUNTER — Inpatient Hospital Stay: Admitting: Oncology

## 2023-12-17 LAB — CULTURE, BLOOD (ROUTINE X 2)
Culture: NO GROWTH
Culture: NO GROWTH

## 2023-12-21 ENCOUNTER — Inpatient Hospital Stay

## 2023-12-21 DIAGNOSIS — Z79899 Other long term (current) drug therapy: Secondary | ICD-10-CM | POA: Diagnosis not present

## 2023-12-21 DIAGNOSIS — D46Z Other myelodysplastic syndromes: Secondary | ICD-10-CM | POA: Diagnosis present

## 2023-12-21 DIAGNOSIS — D469 Myelodysplastic syndrome, unspecified: Secondary | ICD-10-CM

## 2023-12-21 LAB — CBC WITH DIFFERENTIAL (CANCER CENTER ONLY)
Abs Immature Granulocytes: 0.06 10*3/uL (ref 0.00–0.07)
Basophils Absolute: 0 10*3/uL (ref 0.0–0.1)
Basophils Relative: 1 %
Eosinophils Absolute: 0.1 10*3/uL (ref 0.0–0.5)
Eosinophils Relative: 2 %
HCT: 27.6 % — ABNORMAL LOW (ref 39.0–52.0)
Hemoglobin: 8.7 g/dL — ABNORMAL LOW (ref 13.0–17.0)
Immature Granulocytes: 2 %
Lymphocytes Relative: 29 %
Lymphs Abs: 1.1 10*3/uL (ref 0.7–4.0)
MCH: 35.8 pg — ABNORMAL HIGH (ref 26.0–34.0)
MCHC: 31.5 g/dL (ref 30.0–36.0)
MCV: 113.6 fL — ABNORMAL HIGH (ref 80.0–100.0)
Monocytes Absolute: 0.5 10*3/uL (ref 0.1–1.0)
Monocytes Relative: 14 %
Neutro Abs: 2 10*3/uL (ref 1.7–7.7)
Neutrophils Relative %: 52 %
Platelet Count: 198 10*3/uL (ref 150–400)
RBC: 2.43 MIL/uL — ABNORMAL LOW (ref 4.22–5.81)
RDW: 15.5 % (ref 11.5–15.5)
WBC Count: 3.8 10*3/uL — ABNORMAL LOW (ref 4.0–10.5)
nRBC: 0 % (ref 0.0–0.2)

## 2023-12-21 LAB — IRON AND TIBC
Iron: 141 ug/dL (ref 45–182)
Saturation Ratios: 44 % — ABNORMAL HIGH (ref 17.9–39.5)
TIBC: 321 ug/dL (ref 250–450)
UIBC: 180 ug/dL

## 2023-12-21 LAB — FERRITIN: Ferritin: 424 ng/mL — ABNORMAL HIGH (ref 24–336)

## 2023-12-23 ENCOUNTER — Encounter: Payer: Self-pay | Admitting: Oncology

## 2023-12-23 ENCOUNTER — Inpatient Hospital Stay

## 2023-12-23 ENCOUNTER — Inpatient Hospital Stay (HOSPITAL_BASED_OUTPATIENT_CLINIC_OR_DEPARTMENT_OTHER): Admitting: Oncology

## 2023-12-23 VITALS — BP 137/71 | HR 93 | Temp 96.2°F | Resp 18 | Wt 244.8 lb

## 2023-12-23 DIAGNOSIS — K625 Hemorrhage of anus and rectum: Secondary | ICD-10-CM | POA: Diagnosis not present

## 2023-12-23 DIAGNOSIS — D500 Iron deficiency anemia secondary to blood loss (chronic): Secondary | ICD-10-CM

## 2023-12-23 DIAGNOSIS — D469 Myelodysplastic syndrome, unspecified: Secondary | ICD-10-CM | POA: Diagnosis not present

## 2023-12-23 DIAGNOSIS — D46Z Other myelodysplastic syndromes: Secondary | ICD-10-CM | POA: Diagnosis not present

## 2023-12-23 MED ORDER — EPOETIN ALFA-EPBX 10000 UNIT/ML IJ SOLN
10000.0000 [IU] | Freq: Once | INTRAMUSCULAR | Status: AC
  Administered 2023-12-23: 10000 [IU] via SUBCUTANEOUS
  Filled 2023-12-23: qty 1

## 2023-12-23 NOTE — Assessment & Plan Note (Signed)
 off anticoagulation after discussion with cardiology He declined GI for further evaluation.

## 2023-12-23 NOTE — Progress Notes (Signed)
 Hematology/Oncology Progress note [phone] [phone]       ASSESSMENT & PLAN:   Myelodysplastic syndrome (HCC) ZRSR2 mutation.MDS-IB-1 [facility] Dr. Carloyn Chi Deventer's notes were reviewed.  Lab Results  Component Value Date   HGB 8.7 (L) 12/21/2023   TIBC 321 12/21/2023   IRONPCTSAT 44 (H) 12/21/2023   FERRITIN 424 (H) 12/21/2023    Hb has decreased comparing to last visit. He is symptomatic.  Recommend erythropoietin therapy with Retacrit  10,000 units.  Rationale and potential side effects were reviewed with patient and his daughter.  They agreed with the treatment. Ferritin is above 200.  Hold off Venofer  treatments. Plan to titrate dosage according to response.   Iron  deficiency anemia Labs are reviewed and discussed with patient. Lab Results  Component Value Date   HGB 8.7 (L) 12/21/2023   TIBC 321 12/21/2023   IRONPCTSAT 44 (H) 12/21/2023   FERRITIN 424 (H) 12/21/2023    Hold off venofer  today.   Rectal bleeding off anticoagulation after discussion with cardiology He declined GI for further evaluation.    Orders Placed This Encounter  Procedures   CBC with Differential (Cancer Center Only)    Standing Status:   Future    Expected Date:   01/22/2024    Expiration Date:   12/22/2024   Iron  and TIBC    Standing Status:   Future    Expected Date:   01/22/2024    Expiration Date:   12/22/2024   Ferritin    Standing Status:   Future    Expected Date:   01/22/2024    Expiration Date:   12/22/2024   Retic Panel    Standing Status:   Future    Expected Date:   01/22/2024    Expiration Date:   12/22/2024   Hemoglobin and Hematocrit (Cancer Center Only)    Standing Status:   Future    Expected Date:   01/06/2024    Expiration Date:   12/22/2024   Follow up   2 weeks H&H +/- retacrit   1 months lab prior to MD +/- Venofer  +/- Retacrit .  Cbc iron  tibc ferritin retic panel.   All questions were answered. The patient knows to call the clinic with any  problems, questions or concerns.  Jonathan Forbes, MD, PhD [facility] Hematology Oncology 12/23/2023     CHIEF COMPLAINTS/PURPOSE OF CONSULTATION:  MDS  HISTORY OF PRESENTING ILLNESS:  Jonathan Cross. 78 y.o. male presents to establish care for MDS I have reviewed his chart and materials related to his cancer extensively and collaborated history with the patient. Summary of oncologic history is as follows: Oncology History  Myelodysplastic syndrome (HCC)  05/21/2022 Bone Marrow Biopsy   Bone marrow biopsy showed  - - Hypercellular bone marrow (70%) with trilineage hematopoiesis, megakaryocytic dyspoiesis, and 6% blasts by manual aspirate differential (see Comment)  The current findings of pancytopenia, hypercellular bone marrow with megakaryocyte dyspoiesis (>10%) and increased blasts (6%) in addition to the presence of a clonal molecular abnormality (ZRSRS mutation with VAF 50.7) are consistent with myelodysplastic neoplasm with increased blasts (MDS-IB-1) in the absence of MDS or AML defining genetic abnormality.    06/12/2022 Initial Diagnosis   Myelodysplastic syndrome [facility])  Patient follows up with [facility] Dr. Delfin Fell.  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 See above.  Using the IPSS-M, he has low risk disease with a median leukemia  free survival of about 6 years.  There is concern of MDS may add to his possible angina.  Dr. Delfin Fell recommends treatment  1) IF he has evidence of cardiac ischemia that has been maximally medically managed AND a hemoglobin < 9 g/dL 2 ) If his hemoglobin was < 8.0   Darbepoietin/GCSF per Arrie Bienenstock et al Lorelee Roger Hem 2013 May; 92(5) Kelaidi AnnHem 2013 May; 92(5) https://link.[email or web address]/[email or web address])  Ferritin: Treat with iron  if ferritin is < 50 Treatment  Darbepoetin 500 mcg every  2 weeks CR = Hgb > 11.5 PR = Inc in Hgb of 1.5 If no response in 12 weeks, add G-CSF 300 mcg two times a week to maintain WBC counts between 5 and 10  If no response at 24 weeks, discontinue trial If hemoglobin is > 12, hold darbepoetin until hemoglobin is < 11. Resume darbepoetin at every 3 weeks. Continue to increase interval by 1 week if hemoglobin increases to > 12 again.      His hemoglobin was 8 on 05/01/22, and he received one unit of PRBC transfusion.   + chronic fatigue, intermittent chest pain, stable, not worse.    INTERVAL HISTORY Jonathan Cross. is a 78 y.o. male who has above history reviewed by me today presents for follow up visit for MDS, anemia.   Hospitalization 08/24/2023 - 08/25/2023 due to chest pain.  Patient was found to have volume overload and diuretics were adjusted by cardiology. Hospitalization 12/09/2023 - 12/14/2023 due to severe sepsis secondary to right foot cellulitis.  Osteomyelitis was ruled out.  Patient has pyuria/bacteriuria, urine culture negative.  UTI ruled out.  Patient had small abscess in the first webspace which was drained by podiatry on 12/10/2023.  Culture grew MRSA Patient received empiric IV antibiotics followed by oral antibiotics Zyvox . During hospitalization, patient hemoglobin decreased from 9.9 on admission to 8.0.  No evidence of bleeding.  Today patient presented for follow-up.  He was accompanied by daughter.  He feels extremely fatigued.  Patient follows up with wound care tomorrow for right lower extremity wound.  He denies any chest pain currently.  He continues to have intermittent rectal bleeding.  Off Xarelto .   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 04/2022 Echo: EF 60-65%, GrII DD; b. 06/2022 RHC: PA 50/20(30), PCWP 25; c. 11/2022 Echo: EF 60-65%, mild LVH, GrI DD. mildly reduced RV fxn, mild MR,  nl fxn'ing bioprosthetic AoV; d. 01/2023 Echo: EF 60-65%, nl fxn'ing AoV, prob mild AS. Nl RV fxn.   Coronary artery disease    a. 01/2016 CABG x 2: LIMA->LAD, VG->OM; b. 05/2021 MV: small, mild, rev apical lateral and apical inf defects ->subtle ischemia vs artifact, EF 55-65%-->low risk; c. 06/2022 Cath: LM 20d, LAD 80ost, 60p/m, 24m, D1 mild dzs, LCX 50ost/p, OM1 mod dzs, RCA mild diff dzs, RPL1 75, VG->OM2 & LIMA->LAD patent--> Med Rx.   CVA, old, disturbances of vision    Diabetes (HCC)    H/O aortic valve replacement    a. 01/2016 s/p bioprosthetic AVR @ [facility]; b. 05/2021 Echo: EF 55-60%, nl fxn of bioprosthetic AVR; c. 04/2022 Echo: EF 60-65%, nl fxn'ing AoV; d. 11/2022 Echo: nl fxn'ing AoV; e. 01/2023 Echo: nl fxn'ing Aov, mean grad .   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 CHA2DS2VASc = 5-->eliquis /amio; b. 01/2023 Amio d/c'd due to concern re: lung toxicity/ongoing dyspnea.   Retinal vascular occlusion of left eye    Several years ago   Sleep apnea    BiPAP   Vertigo    Wears dentures    full upper and lower.  Lower broken    SURGICAL HISTORY: Past Surgical History:  Procedure Laterality Date   AORTIC VALVE REPLACEMENT  2017   [facility], bioprosthetic   APPENDECTOMY     BONE MARROW BIOPSY     05/2022   CARDIAC VALVE REPLACEMENT     CATARACT EXTRACTION W/PHACO Right 11/12/2022   Procedure: CATARACT EXTRACTION PHACO AND INTRAOCULAR LENS PLACEMENT (IOC) RIGHT DIABETIC MALYUGIN  13.49  01:06.1;  Surgeon: Annell Kidney, MD;  Location: [facility];  Service: Ophthalmology;  Laterality: Right;  Diabetic   CORONARY ARTERY BYPASS GRAFT  2017   [facility] - LIMA-LAD and SVG-OM   CYSTOSCOPY W/ RETROGRADES Right 05/27/2019   Procedure: CYSTOSCOPY WITH RETROGRADE PYELOGRAM;  Surgeon:  Lawerence Pressman, MD;  Location: [facility];  Service: Urology;  Laterality: Right;   CYSTOSCOPY W/ RETROGRADES Right 07/11/2019   Procedure: CYSTOSCOPY WITH RETROGRADE PYELOGRAM;  Surgeon: Lawerence Pressman, MD;  Location: [facility];  Service: Urology;  Laterality: Right;   CYSTOSCOPY/URETEROSCOPY/HOLMIUM LASER/STENT PLACEMENT Right 05/27/2019   Procedure: CYSTOSCOPY/URETEROSCOPY/HOLMIUM LASER/STENT PLACEMENT;  Surgeon: Lawerence Pressman, MD;  Location: [facility];  Service: Urology;  Laterality: Right;   CYSTOSCOPY/URETEROSCOPY/HOLMIUM LASER/STENT PLACEMENT Right 06/17/2019   Procedure: CYSTOSCOPY/URETEROSCOPY/HOLMIUM LASER/STENT Exchange;  Surgeon: Lawerence Pressman, MD;  Location: [facility];  Service: Urology;  Laterality: Right;   CYSTOSCOPY/URETEROSCOPY/HOLMIUM LASER/STENT PLACEMENT Right 07/11/2019   Procedure: CYSTOSCOPY/URETEROSCOPY/HOLMIUM LASER/STENT Exchange;  Surgeon: Lawerence Pressman, MD;  Location: [facility];  Service: Urology;  Laterality: Right;   RIGHT HEART CATH AND CORONARY/GRAFT ANGIOGRAPHY Bilateral 06/17/2022   Procedure: RIGHT HEART CATH AND CORONARY/GRAFT ANGIOGRAPHY;  Surgeon: Sammy Crisp, MD;  Location: [facility];  Service: Cardiovascular;  Laterality: Bilateral;   STONE EXTRACTION WITH BASKET Right 07/11/2019   Procedure: STONE EXTRACTION WITH BASKET;  Surgeon: Lawerence Pressman, MD;  Location: [facility];  Service: Urology;  Laterality: Right;   TEE WITHOUT CARDIOVERSION N/A 04/21/2019   Procedure: TRANSESOPHAGEAL ECHOCARDIOGRAM (TEE);  Surgeon: Devorah Fonder, MD;  Location: [facility];  Service: Cardiovascular;  Laterality: N/A;   TEE WITHOUT CARDIOVERSION N/A 07/04/2019   Procedure: TRANSESOPHAGEAL ECHOCARDIOGRAM (TEE);  Surgeon: Wenona Hamilton, MD;  Location: [facility];  Service: Cardiovascular;  Laterality: N/A;   TEE WITHOUT CARDIOVERSION N/A 08/17/2019   Procedure: TRANSESOPHAGEAL ECHOCARDIOGRAM (TEE);  Surgeon: Constancia Delton, MD;  Location: [facility];   Service: Cardiovascular;  Laterality: N/A;   TEE WITHOUT CARDIOVERSION N/A 11/15/2019   Procedure: TRANSESOPHAGEAL ECHOCARDIOGRAM (TEE);  Surgeon: Sammy Crisp, MD;  Location: [facility];  Service: Cardiovascular;  Laterality: N/A;   TONSILLECTOMY      SOCIAL HISTORY: Social History   Socioeconomic History   Marital status: Widowed    Spouse name: Not on file   Number of children: Not on file   Years of education: Not on file   Highest education level: Not on file  Occupational History   Not on file  Tobacco Use   Smoking status: Former    Current packs/day: 0.00    Types: Cigarettes    Quit date: 76  Years since quitting: 55.4   Smokeless tobacco: Never  Vaping Use   Vaping status: Never Used  Substance and Sexual Activity   Alcohol use: Never   Drug use: Never   Sexual activity: Not Currently  Other Topics Concern   Not on file  Social History Narrative   Not on file   Social Drivers of Health   Financial Resource Strain: Low Risk  (11/03/2023)   Received from [facility]   Overall Financial Resource Strain (CARDIA)    Difficulty of Paying Living Expenses: Not hard at all  Food Insecurity: No Food Insecurity (12/09/2023)   Hunger Vital Sign    Worried About Running Out of Food in the Last Year: Never true    Ran Out of Food in the Last Year: Never true  Transportation Needs: No Transportation Needs (12/09/2023)   PRAPARE - Administrator, Civil Service (Medical): No    Lack of Transportation (Non-Medical): No  Physical Activity: Inactive (04/24/2021)   [facility], [facility]   Exercise Vital Sign    Days of Exercise per Week: 0 days    Minutes of Exercise per Session: 0 min  Stress: No Stress Concern Present (04/24/2021)   Received from [facility], [facility] of Occupational Health - Occupational Stress Questionnaire    Feeling of Stress : Not at all  Social Connections:  Socially Isolated (12/09/2023)   Social Connection and Isolation Panel [NHANES]    Frequency of Communication with Friends and Family: Three times a week    Frequency of Social Gatherings with Friends and Family: Twice a week    Attends Religious Services: Never    Database administrator or Organizations: No    Attends Banker Meetings: Never    Marital Status: Widowed  Intimate Partner Violence: Not At Risk (12/09/2023)   Humiliation, Afraid, Rape, and Kick questionnaire    Fear of Current or Ex-Partner: No    Emotionally Abused: No    Physically Abused: No    Sexually Abused: No    FAMILY HISTORY: Family History  Problem Relation Age of Onset   Heart attack Father 25   Heart attack Sister     ALLERGIES:  is allergic to metformin.  MEDICATIONS:  Current Outpatient Medications  Medication Sig Dispense Refill   acetaminophen  (TYLENOL ) 500 MG tablet Take 2 tablets (1,000 mg total) by mouth every 8 (eight) hours. 30 tablet 0   atorvastatin  (LIPITOR ) 80 MG tablet Take 1 tablet (80 mg total) by mouth every evening. 90 tablet 3   furosemide  (LASIX ) 40 MG tablet Take 1 tablet (40 mg total) by mouth 2 (two) times daily. Take 2 tablets or 80 mg in the morning and 40 mg in afternoon 180 tablet 3   hydrOXYzine (ATARAX) 10 MG tablet Take 10 mg by mouth 3 (three) times daily as needed for itching.     insulin  NPH-regular Human (70-30) 100 UNIT/ML injection Inject 100 Units into the skin 2 (two) times daily.     isosorbide  mononitrate (IMDUR ) 30 MG 24 hr tablet Take 0.5 tablets (15 mg total) by mouth daily. 30 tablet 2   meclizine  (ANTIVERT ) 12.5 MG tablet Take 12.5 mg by mouth every 8 (eight) hours as needed.     metoprolol  succinate (TOPROL -XL) 25 MG 24 hr tablet Take 1 tablet (25 mg total) by mouth 2 (two) times daily. (Patient taking differently: Take 25 mg by  mouth daily.) 60 tablet 1   omeprazole  (PRILOSEC) 20 MG capsule Take 2 capsules by mouth once daily 60 capsule 3    oxyCODONE  (OXY IR/ROXICODONE ) 5 MG immediate release tablet Take 0.5-1 tablets (2.5-5 mg total) by mouth every 4 (four) hours as needed for severe pain (pain score 7-10) or moderate pain (pain score 4-6). 20 tablet 0   polyethylene glycol powder (GLYCOLAX /MIRALAX ) 17 GM/SCOOP powder Take 17 g by mouth daily. 238 g 0   ranolazine  (RANEXA ) 500 MG 12 hr tablet Take 1 tablet by mouth twice daily 180 tablet 3   senna-docusate (SENOKOT-S) 8.6-50 MG tablet Take 1 tablet by mouth 2 (two) times daily. 30 tablet 0   tamsulosin  (FLOMAX ) 0.4 MG CAPS capsule Take 0.4 mg by mouth daily.     nitroGLYCERIN  (NITROSTAT ) 0.4 MG SL tablet Place 1 tablet (0.4 mg total) under the tongue every 5 (five) minutes x 3 doses as needed for chest pain. (Patient not taking: Reported on 12/10/2023) 30 tablet 1   No current facility-administered medications for this visit.    Review of Systems  Constitutional:  Positive for fatigue. Negative for appetite change, chills and fever.  HENT:   Negative for hearing loss and voice change.   Eyes:  Negative for eye problems and icterus.  Respiratory:  Positive for chest tightness. Negative for cough and shortness of breath.   Cardiovascular:  Negative for chest pain and leg swelling.  Gastrointestinal:  Positive for blood in stool. Negative for abdominal distention and abdominal pain.  Endocrine: Negative for hot flashes.  Genitourinary:  Negative for difficulty urinating, dysuria and frequency.   Musculoskeletal:  Negative for arthralgias.  Skin:  Negative for itching and rash.  Neurological:  Negative for light-headedness and numbness.  Hematological:  Negative for adenopathy. Does not bruise/bleed easily.  Psychiatric/Behavioral:  Negative for confusion.      PHYSICAL EXAMINATION:  Vitals:   12/23/23 1324  BP: 137/71  Pulse: 93  Resp: 18  Temp: (!) 96.2 F (35.7 C)  SpO2: 99%   Filed Weights   12/23/23 1324  Weight: 244 lb 12.8 oz (111 kg)    Physical  Exam Constitutional:      General: He is not in acute distress.    Appearance: He is obese. He is not diaphoretic.  HENT:     Head: Normocephalic and atraumatic.  Eyes:     General: No scleral icterus. Cardiovascular:     Rate and Rhythm: Normal rate.  Pulmonary:     Effort: Pulmonary effort is normal. No respiratory distress.  Abdominal:     General: Bowel sounds are normal. There is no distension.     Palpations: Abdomen is soft.  Musculoskeletal:        General: Normal range of motion.     Cervical back: Normal range of motion.  Skin:    General: Skin is warm and dry.     Findings: No erythema.  Neurological:     Mental Status: He is alert and oriented to person, place, and time. Mental status is at baseline.     Cranial Nerves: No cranial nerve deficit.     Motor: No abnormal muscle tone.  Psychiatric:        Mood and Affect: Affect normal.      LABORATORY DATA:  I have reviewed the data as listed    Latest Ref Rng & Units 12/21/2023    2:11 PM 12/13/2023    5:34 AM 12/12/2023    5:42 AM  CBC  WBC 4.0 - 10.5 K/uL 3.8  4.6  5.0   Hemoglobin 13.0 - 17.0 g/dL 8.7  7.9  8.0   Hematocrit 39.0 - 52.0 % 27.6  24.8  25.2   Platelets 150 - 400 K/uL 198  113  110       Latest Ref Rng & Units 12/12/2023    5:42 AM 12/10/2023    2:57 AM 12/09/2023    4:37 PM  CMP  Glucose 70 - 99 mg/dL 098  119  147   BUN 8 - 23 mg/dL 16  14  16    Creatinine 0.61 - 1.24 mg/dL 8.29  5.62  1.30   Sodium 135 - 145 mmol/L 135  135  133   Potassium 3.5 - 5.1 mmol/L 3.6  4.1  3.7   Chloride 98 - 111 mmol/L 103  105  100   CO2 22 - 32 mmol/L 23  23  22    Calcium  8.9 - 10.3 mg/dL 8.2  8.2  9.0   Total Protein 6.5 - 8.1 g/dL   7.9   Total Bilirubin 0.0 - 1.2 mg/dL   1.3   Alkaline Phos 38 - 126 U/L   42   AST 15 - 41 U/L   20   ALT 0 - 44 U/L   16      RADIOGRAPHIC STUDIES: I have personally reviewed the radiological images as listed and agreed with the findings in the report. MR FOOT RIGHT  WO CONTRAST Result Date: 12/10/2023 CLINICAL DATA:  Soft tissue infection suspected, foot, xray done EXAM: MRI OF THE RIGHT FOREFOOT WITHOUT CONTRAST TECHNIQUE: Multiplanar, multisequence MR imaging of the right foot was performed. No intravenous contrast was administered. COMPARISON:  Right foot radiographs dated 12/09/2023. FINDINGS: Bones/Joint/Cartilage No marrow signal abnormality. No fracture or dislocation. Normal alignment. No joint effusion. Moderate first MTP joint space narrowing and marginal osteophytosis. Mild joint space narrowing of second through fifth MTP joints. Diffuse interphalangeal joint space narrowing. Mild degenerative changes of the TMT joints. Ligaments Collateral ligaments are intact.  Lisfranc ligament is intact. Muscles and Tendons Flexor and extensor compartment tendons are intact. No tenosynovitis. Severe fatty atrophy of the intrinsic musculature of the foot, may reflect chronic denervation changes. Soft tissue An 8 x 3 mm cutaneous T2 hyperintense focus in the first webspace, at the lateral base of the great toe, could represent a blister. There is underlying soft tissue edema of the great toe with cutaneous thickening. Nonspecific subcutaneous edema extending along the dorsal foot. No loculated subcutaneous soft tissue fluid collection identified. IMPRESSION: 1. An 8 x 3 mm fluid signal focus in the first webspace, at the lateral base of the great toe, may represent a blister. Underlying soft tissue edema of the great toe with cutaneous thickening is concerning for cellulitis. 2. No marrow signal abnormality identified to suggest osteomyelitis. 3. Mild-to-moderate degenerative changes of the foot, most pronounced at the first MTP joint. 4. Severe fatty atrophy of the intrinsic musculature of the foot, may reflect chronic denervation changes. Electronically Signed   By: Mannie Seek M.D.   On: 12/10/2023 11:40   DG Foot Complete Right Result Date: 12/09/2023 CLINICAL DATA:   Foot wound EXAM: RIGHT FOOT COMPLETE - 3+ VIEW COMPARISON:  None Available. FINDINGS: No fracture or malalignment. Vascular calcifications. No soft tissue emphysema or osseous destructive change. Degenerative changes at the first MTP joint. Small plantar and posterior calcaneal spurs. No soft tissue emphysema IMPRESSION: No acute osseous abnormality. Electronically Signed  By: Esmeralda Hedge M.D.   On: 12/09/2023 17:46   DG Chest Port 1 View Result Date: 12/09/2023 CLINICAL DATA:  Suspected sepsis EXAM: PORTABLE CHEST 1 VIEW COMPARISON:  08/24/2023 FINDINGS: Sternotomy. No acute airspace disease or pleural effusion. Stable cardiomediastinal silhouette. No pneumothorax IMPRESSION: No active disease. Electronically Signed   By: Esmeralda Hedge M.D.   On: 12/09/2023 17:45

## 2023-12-23 NOTE — Assessment & Plan Note (Addendum)
 ZRSR2 mutation.MDS-IB-1 [facility] Dr. Carloyn Chi Deventer's notes were reviewed.  Lab Results  Component Value Date   HGB 8.7 (L) 12/21/2023   TIBC 321 12/21/2023   IRONPCTSAT 44 (H) 12/21/2023   FERRITIN 424 (H) 12/21/2023    Hb has decreased comparing to last visit. He is symptomatic.  Recommend erythropoietin therapy with Retacrit  10,000 units.  Rationale and potential side effects were reviewed with patient and his daughter.  They agreed with the treatment. Ferritin is above 200.  Hold off Venofer  treatments. Plan to titrate dosage according to response.

## 2023-12-23 NOTE — Assessment & Plan Note (Addendum)
 Labs are reviewed and discussed with patient. Lab Results  Component Value Date   HGB 8.7 (L) 12/21/2023   TIBC 321 12/21/2023   IRONPCTSAT 44 (H) 12/21/2023   FERRITIN 424 (H) 12/21/2023    Hold off venofer  today.

## 2024-01-06 ENCOUNTER — Other Ambulatory Visit

## 2024-01-06 ENCOUNTER — Ambulatory Visit

## 2024-01-07 ENCOUNTER — Inpatient Hospital Stay

## 2024-01-22 ENCOUNTER — Inpatient Hospital Stay: Attending: Oncology

## 2024-01-27 ENCOUNTER — Ambulatory Visit

## 2024-01-27 ENCOUNTER — Ambulatory Visit: Admitting: Oncology

## 2024-01-27 ENCOUNTER — Other Ambulatory Visit: Payer: Self-pay

## 2024-01-27 ENCOUNTER — Emergency Department: Admission: EM | Admit: 2024-01-27 | Discharge: 2024-01-27 | Disposition: A | Discharge status: Home / Self Care

## 2024-01-27 DIAGNOSIS — R221 Localized swelling, mass and lump, neck: Secondary | ICD-10-CM | POA: Diagnosis present

## 2024-01-27 DIAGNOSIS — I10 Essential (primary) hypertension: Secondary | ICD-10-CM | POA: Diagnosis not present

## 2024-01-27 DIAGNOSIS — I2510 Atherosclerotic heart disease of native coronary artery without angina pectoris: Secondary | ICD-10-CM | POA: Diagnosis not present

## 2024-01-27 DIAGNOSIS — E119 Type 2 diabetes mellitus without complications: Secondary | ICD-10-CM | POA: Insufficient documentation

## 2024-01-27 DIAGNOSIS — L03221 Cellulitis of neck: Secondary | ICD-10-CM | POA: Diagnosis not present

## 2024-01-27 LAB — BASIC METABOLIC PANEL WITH GFR
Anion gap: 9 (ref 5–15)
BUN: 17 mg/dL (ref 8–23)
CO2: 24 mmol/L (ref 22–32)
Calcium: 8.9 mg/dL (ref 8.9–10.3)
Chloride: 97 mmol/L — ABNORMAL LOW (ref 98–111)
Creatinine, Ser: 0.92 mg/dL (ref 0.61–1.24)
GFR, Estimated: >60 mL/min (ref >60)
Glucose, Bld: 387 mg/dL — ABNORMAL HIGH (ref 70–99)
Potassium: 3.8 mmol/L (ref 3.5–5.1)
Sodium: 130 mmol/L — ABNORMAL LOW (ref 135–145)

## 2024-01-27 LAB — CBC
HCT: 31.4 % — ABNORMAL LOW (ref 39.0–52.0)
Hemoglobin: 10.1 g/dL — ABNORMAL LOW (ref 13.0–17.0)
MCH: 36.7 pg — ABNORMAL HIGH (ref 26.0–34.0)
MCHC: 32.2 g/dL (ref 30.0–36.0)
MCV: 114.2 fL — ABNORMAL HIGH (ref 80.0–100.0)
Platelets: 136 K/uL — ABNORMAL LOW (ref 150–400)
RBC: 2.75 MIL/uL — ABNORMAL LOW (ref 4.22–5.81)
RDW: 15.9 % — ABNORMAL HIGH (ref 11.5–15.5)
WBC: 4.3 K/uL (ref 4.0–10.5)
nRBC: 0 % (ref 0.0–0.2)

## 2024-01-27 MED ORDER — DOXYCYCLINE HYCLATE 100 MG PO TABS
100.0000 mg | ORAL_TABLET | Freq: Once | ORAL | Status: AC
  Administered 2024-01-27: 100 mg via ORAL
  Filled 2024-01-27: qty 1

## 2024-01-27 MED ORDER — DOXYCYCLINE HYCLATE 100 MG PO CAPS
100.0000 mg | ORAL_CAPSULE | Freq: Two times a day (BID) | ORAL | 0 refills | Status: AC
Start: 2024-01-27 — End: 2024-02-03

## 2024-01-27 MED ORDER — METOPROLOL SUCCINATE ER 25 MG PO TB24: 25.0000 mg | ORAL_TABLET | Freq: Two times a day (BID) | ORAL | 1 refills | Status: DC

## 2024-01-27 NOTE — Discharge Instructions (Addendum)
 Please take the antibiotics as prescribed. Follow up with your primary care provider as needed.   Use warm compresses over the area twice a day. This will help it come to a head and may cause it to drain on it's own. If it is more swollen return to the emergency department as it may have developed into an abscess that would require drainage.  A refill of the metoprolol  was sent.

## 2024-01-27 NOTE — ED Provider Notes (Signed)
 [facility] Provider Note    Event Date/Time   First MD Initiated Contact with Patient 01/27/24 2108     (approximate)   History   Bump on neck    HPI  Jonathan Cross. is a 78 y.o. male with PMH of hypertension, diabetes, myelodysplastic syndrome, coronary artery disease presents for evaluation of a bump on his neck.  Patient's daughter reports that he has had multiple skin infections including MRSA.  She brought him in today because she thought he was starting to develop a fever and wanted to get ahead of things.  Patient was admitted to the hospital for sepsis in May and daughter did not want that to happen again.       Physical Exam   Triage Vital Signs: ED Triage Vitals  Encounter Vitals Group     BP 01/27/24 1818 120/82     Girls Systolic BP Percentile --      Girls Diastolic BP Percentile --      Boys Systolic BP Percentile --      Boys Diastolic BP Percentile --      Pulse Rate 01/27/24 1818 94     Resp 01/27/24 1818 16     Temp 01/27/24 1818 98.7 F (37.1 C)     Temp src --      SpO2 01/27/24 1818 92 %     Weight --      Height --      Head Circumference --      Peak Flow --      Pain Score 01/27/24 1822 2     Pain Loc --      Pain Education --      Exclude from Growth Chart --     Most recent vital signs: Vitals:   01/27/24 1818  BP: 120/82  Pulse: 94  Resp: 16  Temp: 98.7 F (37.1 C)  SpO2: 92%   General: Awake, no distress.  CV:  Good peripheral perfusion.  Resp:  Normal effort.  Abd:  No distention.  Other:  Small area of swelling on the left lateral neck with induration, warmth and lymphatic streaking, tender to palpation but no fluctuance.   ED Results / Procedures / Treatments   Labs (all labs ordered are listed, but only abnormal results are displayed) Labs Reviewed  CBC - Abnormal; Notable for the following components:      Result Value   RBC 2.75 (*)    Hemoglobin 10.1 (*)    HCT 31.4 (*)    MCV  114.2 (*)    MCH 36.7 (*)    RDW 15.9 (*)    Platelets 136 (*)    All other components within normal limits  BASIC METABOLIC PANEL WITH GFR - Abnormal; Notable for the following components:   Sodium 130 (*)    Chloride 97 (*)    Glucose, Bld 387 (*)    All other components within normal limits    PROCEDURES:  Critical Care performed: No  Procedures   MEDICATIONS ORDERED IN ED: Medications - No data to display   IMPRESSION / MDM / ASSESSMENT AND PLAN / ED COURSE  I reviewed the triage vital signs and the nursing notes.                             78 year old male presents for evaluation of bump to the back of the neck.  Vital signs are  stable.  Patient is NAD on exam.  Differential diagnosis includes, but is not limited to, cellulitis, folliculitis, abscess, tinea corporis.  Patient's presentation is most consistent with acute, uncomplicated illness.  BMP shows mild hyponatremia and elevated glucose.  CBC shows macrocytic anemia which is actually improved from patient's baseline.  Area of concern evaluated using bedside ultrasound to check for localized infection.  There was no fluid pocket identified.  Do not feel that I&D is indicated at this time.  Advised patient and daughter that I would start oral antibiotics, will give doxycycline  for MRSA coverage.  Recommended warm compresses to encourage this to come to ahead and drain on its own.  We discussed when to follow-up and return to the hospital.  I described when abscess looks and feels like so daughter can check for this at home.  She voiced understanding, all questions were answered and he was stable at discharge.    FINAL CLINICAL IMPRESSION(S) / ED DIAGNOSES   Final diagnoses:  Cellulitis of neck     Rx / DC Orders   ED Discharge Orders          Ordered    doxycycline  (VIBRAMYCIN ) 100 MG capsule  2 times daily        01/27/24 2139    metoprolol  succinate (TOPROL -XL) 25 MG 24 hr tablet  2 times daily         01/27/24 2139             Note:  This document was prepared using Dragon voice recognition software and may include unintentional dictation errors.   Cleaster Tinnie LABOR, PA-C 01/27/24 2147    Clarine Ozell LABOR, MD 01/27/24 [phone]

## 2024-01-27 NOTE — ED Triage Notes (Signed)
 Pt to ED via POV from home. Pt reports believes he has an infection on his neck. Pt has two bumps on back of neck. Pt had a hair cut a week ago and noticed it. Daughter reports temp at home was 99.6

## 2024-01-28 ENCOUNTER — Inpatient Hospital Stay: Admitting: Oncology

## 2024-01-28 ENCOUNTER — Inpatient Hospital Stay

## 2024-01-28 ENCOUNTER — Encounter: Payer: Self-pay | Admitting: Oncology

## 2024-03-08 ENCOUNTER — Other Ambulatory Visit: Payer: Self-pay | Admitting: Internal Medicine

## 2024-03-08 NOTE — Telephone Encounter (Signed)
Please contact pt for future appointment. Pt overdue for follow up.

## 2024-03-28 NOTE — Telephone Encounter (Signed)
 Pt is scheduled on 9/18

## 2024-03-31 ENCOUNTER — Ambulatory Visit: Attending: Nurse Practitioner | Admitting: Nurse Practitioner

## 2024-03-31 ENCOUNTER — Encounter: Payer: Self-pay | Admitting: Nurse Practitioner

## 2024-03-31 VITALS — BP 118/60 | HR 94 | Ht 69.0 in | Wt 246.2 lb

## 2024-03-31 DIAGNOSIS — I480 Paroxysmal atrial fibrillation: Secondary | ICD-10-CM | POA: Diagnosis not present

## 2024-03-31 DIAGNOSIS — I5032 Chronic diastolic (congestive) heart failure: Secondary | ICD-10-CM | POA: Diagnosis not present

## 2024-03-31 DIAGNOSIS — D469 Myelodysplastic syndrome, unspecified: Secondary | ICD-10-CM

## 2024-03-31 DIAGNOSIS — E785 Hyperlipidemia, unspecified: Secondary | ICD-10-CM

## 2024-03-31 DIAGNOSIS — E119 Type 2 diabetes mellitus without complications: Secondary | ICD-10-CM

## 2024-03-31 DIAGNOSIS — Z794 Long term (current) use of insulin: Secondary | ICD-10-CM

## 2024-03-31 DIAGNOSIS — K625 Hemorrhage of anus and rectum: Secondary | ICD-10-CM

## 2024-03-31 DIAGNOSIS — I2510 Atherosclerotic heart disease of native coronary artery without angina pectoris: Secondary | ICD-10-CM | POA: Diagnosis not present

## 2024-03-31 DIAGNOSIS — I10 Essential (primary) hypertension: Secondary | ICD-10-CM

## 2024-03-31 DIAGNOSIS — Z952 Presence of prosthetic heart valve: Secondary | ICD-10-CM

## 2024-03-31 MED ORDER — POTASSIUM CHLORIDE ER 20 MEQ PO TBCR: 20.0000 meq | EXTENDED_RELEASE_TABLET | Freq: Every day | ORAL | 2 refills | Status: DC

## 2024-03-31 NOTE — Progress Notes (Signed)
 Office Visit    Patient Name: Jonathan Cross. Date of Encounter: 03/31/2024  Primary Care Provider:  Minor, Jonette, [place] Primary Cardiologist:  Lonni Hanson, MD    Chief Complaint    78 y.o. male with a h/o CAD status post CABG x2, severe aortic stenosis status post bioprosthetic aortic valve replacement, bacterial endocarditis, paroxysmal atrial fibrillation, hypertension, hyperlipidemia, type 2 diabetes mellitus, sleep apnea, and BPH, who presents for follow-up of CAD and HFpEF.  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 04/2022 Echo: EF 60-65%, GrII DD; b. 06/2022 RHC: PA 50/20(30), PCWP 25; c. 11/2022 Echo: EF 60-65%, mild LVH, GrI DD. mildly reduced RV fxn, mild MR, nl fxn'ing bioprosthetic AoV; d. 01/2023 Echo: EF 60-65%, nl fxn'ing AoV, prob mild AS. Nl RV fxn; e. 08/2023 Echo: EF 65-70%, no rwma, GrI DD, mildly reduced RV fxn, triv MR, Mild AS. Asc Ao 39mm.   Coronary artery disease    a. 01/2016 CABG x 2: LIMA->LAD, VG->OM; b. 05/2021 MV: small, mild, rev apical lateral and apical inf defects ->subtle ischemia vs artifact, EF 55-65%-->low risk; c. 06/2022 Cath: LM 20d, LAD 80ost, 60p/m, 71m, D1 mild dzs, LCX 50ost/p, OM1 mod dzs, RCA mild diff dzs, RPL1 75, VG->OM2 & LIMA->LAD patent--> Med Rx.   CVA, old, disturbances of vision    Diabetes (HCC)    H/O aortic valve replacement    a. 01/2016 s/p bioprosthetic AVR @ [facility] for Severe AS; b. 05/2021 Echo: EF 55-60%, nl fxn of bioprosthetic AVR; c. 04/2022 Echo: EF 60-65%, nl fxn'ing AoV; d. 11/2022 Echo: nl fxn'ing AoV; e. 01/2023 Echo: nl fxn'ing Aov, mean grad ; f. 08/2023 Echo: Mild AS (mean grad ).   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 CHA2DS2VASc = 5-->eliquis nori; b. 01/2023 Amio d/c'd due to concern re: lung toxicity/ongoing dyspnea.   Retinal vascular occlusion of left eye    Several years ago   Sleep apnea    BiPAP   Vertigo    Wears dentures    full upper and lower.  Lower broken   Past Surgical History:  Procedure Laterality Date   AORTIC VALVE REPLACEMENT  2017   [facility], bioprosthetic   APPENDECTOMY     BONE MARROW BIOPSY     05/2022   CARDIAC VALVE REPLACEMENT     CATARACT EXTRACTION W/PHACO Right 11/12/2022   Procedure: CATARACT EXTRACTION PHACO AND INTRAOCULAR LENS PLACEMENT (IOC) RIGHT DIABETIC MALYUGIN  13.49  01:06.1;  Surgeon: Mittie Gaskin, MD;  Location: [facility];  Service: Ophthalmology;  Laterality: Right;  Diabetic   CORONARY ARTERY BYPASS GRAFT  2017   [facility] - LIMA-LAD and SVG-OM   CYSTOSCOPY W/ RETROGRADES Right 05/27/2019   Procedure: CYSTOSCOPY WITH RETROGRADE PYELOGRAM;  Surgeon: Francisca Redell BROCKS, MD;  Location: [facility];  Service: Urology;  Laterality: Right;   CYSTOSCOPY W/ RETROGRADES Right 07/11/2019   Procedure: CYSTOSCOPY WITH RETROGRADE PYELOGRAM;  Surgeon: Francisca Redell BROCKS, MD;  Location: [facility];  Service: Urology;  Laterality: Right;   CYSTOSCOPY/URETEROSCOPY/HOLMIUM LASER/STENT PLACEMENT Right 05/27/2019   Procedure: CYSTOSCOPY/URETEROSCOPY/HOLMIUM LASER/STENT PLACEMENT;  Surgeon: Francisca Redell BROCKS, MD;  Location: [facility];  Service: Urology;  Laterality: Right;   CYSTOSCOPY/URETEROSCOPY/HOLMIUM LASER/STENT PLACEMENT Right 06/17/2019   Procedure: CYSTOSCOPY/URETEROSCOPY/HOLMIUM LASER/STENT Exchange;  Surgeon: Francisca Redell BROCKS, MD;  Location: [facility];  Service: Urology;  Laterality: Right;   CYSTOSCOPY/URETEROSCOPY/HOLMIUM LASER/STENT PLACEMENT Right 07/11/2019   Procedure: CYSTOSCOPY/URETEROSCOPY/HOLMIUM LASER/STENT Exchange;  Surgeon: Francisca Redell BROCKS, MD;  Location: [facility];  Service: Urology;  Laterality: Right;    RIGHT HEART CATH AND CORONARY/GRAFT ANGIOGRAPHY Bilateral 06/17/2022   Procedure: RIGHT HEART CATH AND CORONARY/GRAFT ANGIOGRAPHY;  Surgeon: Mady Bruckner, MD;  Location: [facility];  Service: Cardiovascular;  Laterality: Bilateral;   STONE EXTRACTION WITH BASKET Right 07/11/2019   Procedure: STONE EXTRACTION WITH BASKET;  Surgeon: Francisca Redell BROCKS, MD;  Location: [facility];  Service: Urology;  Laterality: Right;   TEE WITHOUT CARDIOVERSION N/A 04/21/2019   Procedure: TRANSESOPHAGEAL ECHOCARDIOGRAM (TEE);  Surgeon: Perla Evalene PARAS, MD;  Location: [facility];  Service: Cardiovascular;  Laterality: N/A;   TEE WITHOUT CARDIOVERSION N/A 07/04/2019   Procedure: TRANSESOPHAGEAL ECHOCARDIOGRAM (TEE);  Surgeon: Darron Deatrice LABOR, MD;  Location: [facility];  Service: Cardiovascular;  Laterality: N/A;   TEE WITHOUT CARDIOVERSION N/A 08/17/2019   Procedure: TRANSESOPHAGEAL ECHOCARDIOGRAM (TEE);  Surgeon: Darliss Redell, MD;  Location: [facility];  Service: Cardiovascular;  Laterality: N/A;   TEE WITHOUT CARDIOVERSION N/A 11/15/2019   Procedure: TRANSESOPHAGEAL ECHOCARDIOGRAM (TEE);  Surgeon: Mady Bruckner, MD;  Location: [facility];  Service: Cardiovascular;  Laterality: N/A;   TONSILLECTOMY      Allergies  Allergies  Allergen Reactions   Metformin Diarrhea and Anaphylaxis    Loose stools even with XR       History of Present Illness      78 y.o. y/o male with the above past medical history following CAD status post CABG x2, severe aortic stenosis status post bioprosthetic aortic valve replacement, bacterial endocarditis, paroxysmal atrial fibrillation, hypertension, hyperlipidemia, type 2 diabetes mellitus, sleep apnea, and BPH.  He was previously followed at [facility] in the setting of severe aortic stenosis and underwent bioprosthetic aortic valve replacement two-vessel coronary artery bypass grafting in July 2017.  Between October 2020 in May 2021, he had multiple admissions related to  fever, malaise, and Enterococcus faecalis bacteremia.  TEEs in October 2020 and December 2020 were negative for vegetation.  In early 2021, he was found to have a mobile mass on his prosthetic aortic valve and was seen by infectious disease treated with appropriate antibiotics for 6 weeks in the outpatient setting.  Prior to PICC line removal, repeat TEE in May 2021 continued to show an echodensity that appeared less mobile but larger, measuring 1.1 x 0.6 cm.  Perivalvular abscess could not be excluded.  He was transferred to [facility] and underwent cardiac CT which showed evidence of a small mass in the leaflet of the noncoronary cusp, likely representing a healed vegetation.  Leaflets were freely movable without evidence of obstruction.  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  The LIMA to the LAD and vein graft to the obtuse marginal were patent.  He was seen by CT surgery who recommended continued observation and antibiotic therapy.  In November 2022, he was admitted with dyspnea and chest fullness and found to be in rapid atrial fibrillation, which converted to sinus rhythm on intravenous amiodarone .  He underwent stress testing in the setting of chest pain, which showed a small, mild, reversible apical lateral and apical inferior  defect, with question of subtle ischemia versus infarct.  Ultimately, it was felt to be a low risk study and he was medically managed.    In October 2023, he was admitted to [facility] with chest pain and mild troponin elevation (32  32).  He was found to be anemic and received 1 unit of packed red blood cells.  Echo showed normal LV function with normal functioning bioprosthetic aortic valve.  Eliquis  was held and subsequently resumed in the outpatient setting.  It was felt he would require diagnostic catheterization following anemia workup.  He was subsequently seen by hematology with plan  for bone marrow biopsy and diagnosed with myelodysplastic syndrome.  Due to escalating dyspnea and angina, he underwent diagnostic catheterization and December 2023, with moderate, diffuse multivessel disease with patent LIMA to the LAD and vein graft to the OM 2.  Filling pressures were elevated with a PA of 50/20 and wedge of 25.  Cardiac output/index were normal.  Outpatient diuretic therapy was escalated and he was otherwise medically managed.  He was readmitted in May 2024 with atypical chest pain, normal cardiac enzymes, and volume overload requiring intravenous diuresis.  Echo during that admission showed an EF of 60 to 65% with grade 1 diastolic dysfunction, and normal functioning aortic valve bioprosthesis with a mean gradient of 5 mmHg.  He was subsequently readmitted to [facility] in July 2024 with ongoing dyspnea.  CT of the chest notable for bilateral parenchymal abnormalities largely peribronchial vascular with question of organizing pneumonia versus other idiopathic related pulmonary toxicity.  He was seen by pulmonology.  Amiodarone  was discontinued and he was treated with antibiotics.  Repeat echo showed normal LV function and normal functioning bioprosthetic valve with probable mild prosthetic valve stenosis-mean gradient of 23 mmHg.  He was subsequently discharged and has been following up with primary care.  Recent CT chest (03/30/2023) showed increased right lung greater than left peripheral predominant airspace disease possibly representing worsening organizing pneumonia though infection considered less likely etiology.   In February 2025, Jonathan Cross was rehospitalized for HFpEF requiring diuretic adjustments.  In April 2025, he was admitted to [facility] for MRSA bacteremia, sepsis, and left gluteal wound requiring I&D.  Discharge summary indicates that amiodarone  was discontinued during admission due to concern for potential lung involvement, though he was not on amiodarone  prior to the hospitalization.    He was readmitted in June 2025 to [facility] with sepsis due to right foot cellulitis and suspected UTI.     Jonathan Cross is accompanied by his son today.  Over the past few weeks, Jonathan Cross notes that he has been feeling well.  He has become somewhat accustomed to experiencing some amount of right or left-sided chest pain which is usually accompanied by tenderness though in the past few weeks, he has not had any.  He has chronic dyspnea on exertion which is unchanged.  He is very sedentary and notes that activity is limited to moving from his bed to the couch to the bathroom.  He has not been experiencing lower extremity edema and does not routinely weigh himself at home.  His son indicates that now that Jonathan Cross is living with his daughter, his hygiene has improved and they are hopeful that this will reduce his risk of infections going forward.  He denies palpitations, PND, orthopnea, dizziness, syncope, edema, or early satiety.  He has been dealing with a fungal infection for which he is seeing dermatology. Objective   Home Medications  Current Outpatient Medications  Medication Sig Dispense Refill   acetaminophen  (TYLENOL ) 500 MG tablet Take 2 tablets (1,000 mg total) by mouth every 8 (eight) hours. 30 tablet 0   furosemide  (LASIX ) 40 MG tablet Take 1 tablet (40 mg total) by mouth 2 (two) times daily. Take 2 tablets or 80 mg in the morning and 40 mg in afternoon 180 tablet 3   insulin  NPH-regular Human (70-30) 100 UNIT/ML injection Inject 100 Units into the skin 2 (two) times daily.     isosorbide  mononitrate (IMDUR ) 30 MG 24 hr tablet Take 0.5 tablets (15 mg total) by mouth daily. 30 tablet 2   meclizine  (ANTIVERT ) 12.5 MG tablet Take 12.5 mg by mouth every 8 (eight) hours as needed.     metoprolol  succinate (TOPROL -XL) 25 MG 24 hr tablet Take 1 tablet (25 mg total) by mouth 2 (two) times daily. 60 tablet 1   nitroGLYCERIN  (NITROSTAT ) 0.4 MG SL tablet Place 1 tablet (0.4 mg total) under the  tongue every 5 (five) minutes x 3 doses as needed for chest pain. 30 tablet 1   omeprazole  (PRILOSEC) 20 MG capsule Take 2 capsules by mouth once daily 30 capsule 0   oxyCODONE  (OXY IR/ROXICODONE ) 5 MG immediate release tablet Take 0.5-1 tablets (2.5-5 mg total) by mouth every 4 (four) hours as needed for severe pain (pain score 7-10) or moderate pain (pain score 4-6). 20 tablet 0   polyethylene glycol powder (GLYCOLAX /MIRALAX ) 17 GM/SCOOP powder Take 17 g by mouth daily. 238 g 0   potassium chloride  20 MEQ TBCR Take 1 tablet (20 mEq total) by mouth daily. 30 tablet 2   ranolazine  (RANEXA ) 500 MG 12 hr tablet Take 1 tablet by mouth twice daily 180 tablet 3   rosuvastatin (CRESTOR) 40 MG tablet Take 40 mg by mouth daily.     tamsulosin  (FLOMAX ) 0.4 MG CAPS capsule Take 0.4 mg by mouth daily.     atorvastatin  (LIPITOR ) 80 MG tablet Take 1 tablet (80 mg total) by mouth every evening. (Patient not taking: Reported on 03/31/2024) 90 tablet 3   senna-docusate (SENOKOT-S) 8.6-50 MG tablet Take 1 tablet by mouth 2 (two) times daily. (Patient not taking: Reported on 03/31/2024) 30 tablet 0   No current facility-administered medications for this visit.     Physical Exam    VS:  BP 118/60 (BP Location: Right Arm, Patient Position: Sitting, Cuff Size: Large)   Pulse 94   Ht 5' 9 (1.753 m)   Wt 246 lb 4 oz (111.7 kg)   SpO2 98%   BMI 36.36 kg/m  , BMI Body mass index is 36.36 kg/m.          GEN: Well nourished, well developed, in no acute distress. HEENT: normal. Neck: Supple, no JVD, carotid bruits, or masses. Cardiac: RRR, 2/6 systolic murmur at the upper sternal borders and along the left lower sternal border.  No rubs or gallops.  No clubbing, cyanosis, edema.  Radials 2+/PT 2+ and equal bilaterally.  Respiratory:  Respirations regular and unlabored, clear to auscultation bilaterally. GI: Soft, nontender, nondistended, BS + x 4. MS: no deformity or atrophy. Skin: warm and dry, no  rash. Neuro:  Strength and sensation are intact. Psych: Normal affect.  Accessory Clinical Findings    ECG personally reviewed by me today - EKG Interpretation Date/Time:  Thursday March 31 2024 15:03:30 EDT Ventricular Rate:  94 PR Interval:  174 QRS Duration:  88 QT Interval:  386 QTC Calculation: 482 R Axis:   -  46  Text Interpretation: Normal sinus rhythm Left axis deviation Moderate voltage criteria for LVH, may be normal variant ( R in aVL , Cornell product ) Anteroseptal infarct , age undetermined Confirmed by Vivienne Bruckner [id number]) on 03/31/2024 5:22:26 PM  - no acute changes.  Labs dated March 18, 2024 in Care Everywhere:  Hemoglobin 11.5, hematocrit 34.9, WBC 2.9, platelets 138 Sodium 139, potassium 3.4, chloride 101, CO2 29.5, BUN 17, creatinine 1.0, glucose 216 Calcium  9.0, albumin 4.1, total protein 8.1 Total bilirubin 0.4, alkaline phosphatase 59, AST 22, ALT 31 Total cholesterol 181, triglycerides 275, HDL 26, LDL 100  Lab Results  Component Value Date   HGBA1C 6.9 (H) 12/10/2023       Assessment & Plan    1.  Precordial chest pain/CAD: Status post CABG x 2 in July 2017.  He has had chronic and intermittent chest pain and chest wall tenderness ever since.  Diagnostic catheterization December 2023 showed patent LIMA to the LAD and vein graft to the second obtuse marginal otherwise moderate, diffuse CAD.  Chest pain symptoms have improved over the past few weeks and he notes that the first time in a long time that he has had no discomfort.  He does have chronic, stable dyspnea on exertion.  He remains on aspirin , statin, beta-blocker, nitrate, and Ranexa  therapy.  2.  Paroxysmal atrial fibrillation: Maintaining sinus rhythm on beta-blocker therapy.  He has been off of anticoagulation since the fall 2024 in the setting of prior rectal bleeding and ongoing anemia in the setting of myelodysplastic syndrome.  3.  Chronic HFpEF: Admission in February 2025 due to  HFpEF with echo at that time showing EF of 65 to 70% with mild aortic stenosis.  He is euvolemic on examination today with stable blood pressure.  It is notable that since June, his weight is down 14 pounds, which his son attributes to his sister now cooking for Jonathan Cross.  Stable lab work earlier this month.  Continue current dose of furosemide .  4.  Status post AVR: Previously complicated by SBE.  Normal functioning bioprosthetic aortic valve on echo in February.  5.  Type 2 diabetes mellitus: A1c 6.9 in May 2025.  Followed by primary care.  6.  Primary hypertension: Stable on beta-blocker, diuretic, and nitrate therapy.  7.  Hyperlipidemia: LDL of 100 on evaluation the other day.  He reports compliance with rosuvastatin.  Discussed importance of low-fat low-cholesterol diet, increase activity, and weight loss.  Further discussed that pending lifestyle changes and further weight loss, may need to add ezetimibe therapy.  8.  MDS/anemia: H&H recently stable.  Followed by hematology.  9.  History of rectal bleeding: No recent rectal bleeding.  In the setting of prior history along with anemia, he remains off of oral anticoagulation.  10.  Hypokalemia: Recent lab work showed potassium 3.4.  As he is on chronic Lasix  therapy, will add K-Dur 20 mill equivalents daily.  11.  Disposition: Follow-up in 3 months or sooner if necessary.  Bruckner Vivienne, NP 03/31/2024, 5:23 PM

## 2024-03-31 NOTE — Patient Instructions (Signed)
 Medication Instructions:  START Potassium 20 mEq once daily  *If you need a refill on your cardiac medications before your next appointment, please call your pharmacy*  Lab Work: None ordered If you have labs (blood work) drawn today and your tests are completely normal, you will receive your results only by: MyChart Message (if you have MyChart) OR A paper copy in the mail If you have any lab test that is abnormal or we need to change your treatment, we will call you to review the results.  Testing/Procedures: None ordered  Follow-Up: At [facility], you and your health needs are our priority.  As part of our continuing mission to provide you with exceptional heart care, our providers are all part of one team.  This team includes your primary Cardiologist (physician) and Advanced Practice Providers or APPs (Physician Assistants and Nurse Practitioners) who all work together to provide you with the care you need, when you need it.  Your next appointment:   3 month(s)  Provider:   Lonni Hanson, MD    We recommend signing up for the patient portal called MyChart.  Sign up information is provided on this After Visit Summary.  MyChart is used to connect with patients for Virtual Visits (Telemedicine).  Patients are able to view lab/test results, encounter notes, upcoming appointments, etc.  Non-urgent messages can be sent to your provider as well.   To learn more about what you can do with MyChart, go to [email or web address].

## 2024-04-27 ENCOUNTER — Other Ambulatory Visit: Payer: Self-pay | Admitting: Internal Medicine

## 2024-05-09 ENCOUNTER — Other Ambulatory Visit: Payer: Self-pay | Admitting: Internal Medicine

## 2024-06-17 ENCOUNTER — Ambulatory Visit: Attending: Internal Medicine | Admitting: Internal Medicine

## 2024-06-17 NOTE — Progress Notes (Deleted)
  Cardiology Office Note:  .   Date:  06/17/2024  ID:  Jonathan Cross., DOB 12/28/1945, MRN [id number] PCP: Roxanna Rocks, [place]  [facility] Providers Cardiologist:  Lonni Hanson, MD { Click to update primary MD,subspecialty MD or APP then REFRESH:1}    History of Present Illness: Jonathan   Eitan Cross. is a 78 y.o. 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  He was last seen in the office in mid September by Medford Meager, NP, at which time he was feeling fairly well, albeit on a background of chronic migratory chest pain as well as exertional dyspnea.  These were both unchanged from prior visits.  ROS: See HPI  Studies Reviewed: Jonathan        TTE (08/25/2023): Normal LV size with mild LVH.  LVEF 65-70% with grade 1 diastolic dysfunction and elevated filling pressure.  Normal RV size and function.  Normal biatrial size.  Degenerative mitral valve with trivial regurgitation and moderate MAC.  Bioprosthetic aortic valve with mean gradient of 13 mmHg, DI 0.39, and calculated valve area of 1.0 cm.  Mildly dilated aortic root measuring 3.9 cm noted.  Risk Assessment/Calculations:   {Does this patient have ATRIAL FIBRILLATION?:[phone]} No BP recorded.  {Refresh Note OR Click here to enter BP  :1}***       Physical Exam:   VS:  There were no vitals taken for this visit.   Wt Readings from Last 3 Encounters:  03/31/24 246 lb 4 oz (111.7 kg)  12/23/23 244 lb 12.8 oz (111 kg)  12/09/23 260 lb (117.9 kg)    General:   NAD. Neck: No JVD or HJR. Lungs: Clear to auscultation bilaterally without wheezes or crackles. Heart: Regular rate and rhythm without murmurs, rubs, or gallops. Abdomen: Soft, nontender, nondistended. Extremities: No lower extremity edema.  ASSESSMENT AND PLAN: .    ***    {Are you ordering a CV Procedure (e.g. stress test, cath, DCCV, TEE, etc)?   Press F2        :[id number]}  Dispo: ***  Signed, Lonni Hanson, MD

## 2024-06-18 ENCOUNTER — Inpatient Hospital Stay

## 2024-06-18 ENCOUNTER — Other Ambulatory Visit: Payer: Self-pay

## 2024-06-18 ENCOUNTER — Inpatient Hospital Stay: Admit: 2024-06-18 | Discharge: 2024-06-18 | Disposition: A | Attending: Hospitalist

## 2024-06-18 ENCOUNTER — Inpatient Hospital Stay
Admission: EM | Admit: 2024-06-18 | Discharge: 2024-06-23 | Disposition: A | Discharge status: Skilled Nursing Facility | Attending: Internal Medicine | Admitting: Internal Medicine

## 2024-06-18 ENCOUNTER — Emergency Department

## 2024-06-18 DIAGNOSIS — E1165 Type 2 diabetes mellitus with hyperglycemia: Secondary | ICD-10-CM | POA: Diagnosis present

## 2024-06-18 DIAGNOSIS — I110 Hypertensive heart disease with heart failure: Secondary | ICD-10-CM | POA: Diagnosis present

## 2024-06-18 DIAGNOSIS — Z1152 Encounter for screening for COVID-19: Secondary | ICD-10-CM

## 2024-06-18 DIAGNOSIS — W228XXA Striking against or struck by other objects, initial encounter: Secondary | ICD-10-CM | POA: Diagnosis present

## 2024-06-18 DIAGNOSIS — R197 Diarrhea, unspecified: Secondary | ICD-10-CM | POA: Diagnosis present

## 2024-06-18 DIAGNOSIS — W19XXXA Unspecified fall, initial encounter: Secondary | ICD-10-CM | POA: Diagnosis present

## 2024-06-18 DIAGNOSIS — S0990XA Unspecified injury of head, initial encounter: Secondary | ICD-10-CM | POA: Diagnosis present

## 2024-06-18 DIAGNOSIS — K219 Gastro-esophageal reflux disease without esophagitis: Secondary | ICD-10-CM | POA: Diagnosis present

## 2024-06-18 DIAGNOSIS — E785 Hyperlipidemia, unspecified: Secondary | ICD-10-CM | POA: Diagnosis present

## 2024-06-18 DIAGNOSIS — N400 Enlarged prostate without lower urinary tract symptoms: Secondary | ICD-10-CM | POA: Diagnosis present

## 2024-06-18 DIAGNOSIS — R791 Abnormal coagulation profile: Secondary | ICD-10-CM | POA: Diagnosis present

## 2024-06-18 DIAGNOSIS — I340 Nonrheumatic mitral (valve) insufficiency: Secondary | ICD-10-CM | POA: Diagnosis not present

## 2024-06-18 DIAGNOSIS — Z87442 Personal history of urinary calculi: Secondary | ICD-10-CM

## 2024-06-18 DIAGNOSIS — I5032 Chronic diastolic (congestive) heart failure: Secondary | ICD-10-CM | POA: Diagnosis present

## 2024-06-18 DIAGNOSIS — B961 Klebsiella pneumoniae [K. pneumoniae] as the cause of diseases classified elsewhere: Secondary | ICD-10-CM | POA: Diagnosis present

## 2024-06-18 DIAGNOSIS — I350 Nonrheumatic aortic (valve) stenosis: Secondary | ICD-10-CM

## 2024-06-18 DIAGNOSIS — I10 Essential (primary) hypertension: Secondary | ICD-10-CM | POA: Diagnosis present

## 2024-06-18 DIAGNOSIS — A419 Sepsis, unspecified organism: Secondary | ICD-10-CM | POA: Diagnosis present

## 2024-06-18 DIAGNOSIS — I480 Paroxysmal atrial fibrillation: Secondary | ICD-10-CM | POA: Diagnosis present

## 2024-06-18 DIAGNOSIS — N390 Urinary tract infection, site not specified: Secondary | ICD-10-CM | POA: Diagnosis not present

## 2024-06-18 DIAGNOSIS — E1142 Type 2 diabetes mellitus with diabetic polyneuropathy: Secondary | ICD-10-CM | POA: Diagnosis present

## 2024-06-18 DIAGNOSIS — Z953 Presence of xenogenic heart valve: Secondary | ICD-10-CM

## 2024-06-18 DIAGNOSIS — B9689 Other specified bacterial agents as the cause of diseases classified elsewhere: Secondary | ICD-10-CM | POA: Diagnosis present

## 2024-06-18 DIAGNOSIS — Z9981 Dependence on supplemental oxygen: Secondary | ICD-10-CM

## 2024-06-18 DIAGNOSIS — J9601 Acute respiratory failure with hypoxia: Secondary | ICD-10-CM | POA: Diagnosis present

## 2024-06-18 DIAGNOSIS — N3000 Acute cystitis without hematuria: Secondary | ICD-10-CM | POA: Diagnosis present

## 2024-06-18 DIAGNOSIS — Z87891 Personal history of nicotine dependence: Secondary | ICD-10-CM

## 2024-06-18 DIAGNOSIS — Z79899 Other long term (current) drug therapy: Secondary | ICD-10-CM | POA: Diagnosis not present

## 2024-06-18 DIAGNOSIS — R6520 Severe sepsis without septic shock: Secondary | ICD-10-CM | POA: Diagnosis present

## 2024-06-18 DIAGNOSIS — I639 Cerebral infarction, unspecified: Secondary | ICD-10-CM | POA: Diagnosis present

## 2024-06-18 DIAGNOSIS — I2510 Atherosclerotic heart disease of native coronary artery without angina pectoris: Secondary | ICD-10-CM | POA: Diagnosis present

## 2024-06-18 DIAGNOSIS — R300 Dysuria: Secondary | ICD-10-CM | POA: Diagnosis present

## 2024-06-18 DIAGNOSIS — Z8614 Personal history of Methicillin resistant Staphylococcus aureus infection: Secondary | ICD-10-CM

## 2024-06-18 DIAGNOSIS — I4892 Unspecified atrial flutter: Secondary | ICD-10-CM | POA: Diagnosis present

## 2024-06-18 DIAGNOSIS — Z8249 Family history of ischemic heart disease and other diseases of the circulatory system: Secondary | ICD-10-CM

## 2024-06-18 DIAGNOSIS — J189 Pneumonia, unspecified organism: Secondary | ICD-10-CM | POA: Diagnosis present

## 2024-06-18 DIAGNOSIS — Y92009 Unspecified place in unspecified non-institutional (private) residence as the place of occurrence of the external cause: Secondary | ICD-10-CM

## 2024-06-18 DIAGNOSIS — R7881 Bacteremia: Secondary | ICD-10-CM | POA: Diagnosis not present

## 2024-06-18 DIAGNOSIS — Z794 Long term (current) use of insulin: Secondary | ICD-10-CM | POA: Diagnosis not present

## 2024-06-18 DIAGNOSIS — G4733 Obstructive sleep apnea (adult) (pediatric): Secondary | ICD-10-CM | POA: Diagnosis present

## 2024-06-18 DIAGNOSIS — B957 Other staphylococcus as the cause of diseases classified elsewhere: Secondary | ICD-10-CM | POA: Diagnosis present

## 2024-06-18 DIAGNOSIS — Z951 Presence of aortocoronary bypass graft: Secondary | ICD-10-CM

## 2024-06-18 DIAGNOSIS — Z9181 History of falling: Secondary | ICD-10-CM

## 2024-06-18 DIAGNOSIS — Z952 Presence of prosthetic heart valve: Secondary | ICD-10-CM

## 2024-06-18 DIAGNOSIS — I63522 Cerebral infarction due to unspecified occlusion or stenosis of left anterior cerebral artery: Secondary | ICD-10-CM | POA: Diagnosis present

## 2024-06-18 LAB — CBC WITH DIFFERENTIAL/PLATELET
Abs Immature Granulocytes: 0.28 K/uL — ABNORMAL HIGH (ref 0.00–0.07)
Basophils Absolute: 0.1 K/uL (ref 0.0–0.1)
Basophils Relative: 0 %
Eosinophils Absolute: 0 K/uL (ref 0.0–0.5)
Eosinophils Relative: 0 %
HCT: 28.9 % — ABNORMAL LOW (ref 39.0–52.0)
Hemoglobin: 9.3 g/dL — ABNORMAL LOW (ref 13.0–17.0)
Immature Granulocytes: 2 %
Lymphocytes Relative: 5 %
Lymphs Abs: 0.8 K/uL (ref 0.7–4.0)
MCH: 36.6 pg — ABNORMAL HIGH (ref 26.0–34.0)
MCHC: 32.2 g/dL (ref 30.0–36.0)
MCV: 113.8 fL — ABNORMAL HIGH (ref 80.0–100.0)
Monocytes Absolute: 1.7 K/uL — ABNORMAL HIGH (ref 0.1–1.0)
Monocytes Relative: 10 %
Neutro Abs: 13.5 K/uL — ABNORMAL HIGH (ref 1.7–7.7)
Neutrophils Relative %: 83 %
Platelets: 140 K/uL — ABNORMAL LOW (ref 150–400)
RBC: 2.54 MIL/uL — ABNORMAL LOW (ref 4.22–5.81)
RDW: 15.7 % — ABNORMAL HIGH (ref 11.5–15.5)
Smear Review: NORMAL
WBC: 16.3 K/uL — ABNORMAL HIGH (ref 4.0–10.5)
nRBC: 0.1 % (ref 0.0–0.2)

## 2024-06-18 LAB — PROTIME-INR
INR: 1.3 — ABNORMAL HIGH (ref 0.8–1.2)
Prothrombin Time: 17 s — ABNORMAL HIGH (ref 11.4–15.2)

## 2024-06-18 LAB — COMPREHENSIVE METABOLIC PANEL WITH GFR
ALT: 14 U/L (ref 0–44)
ALT: 12 U/L (ref 0–44)
AST: 20 U/L (ref 15–41)
AST: 20 U/L (ref 15–41)
Albumin: 3.6 g/dL (ref 3.5–5.0)
Albumin: 3.4 g/dL — ABNORMAL LOW (ref 3.5–5.0)
Alkaline Phosphatase: 59 U/L (ref 38–126)
Alkaline Phosphatase: 56 U/L (ref 38–126)
Anion gap: 13 (ref 5–15)
Anion gap: 14 (ref 5–15)
BUN: 14 mg/dL (ref 8–23)
BUN: 13 mg/dL (ref 8–23)
CO2: 20 mmol/L — ABNORMAL LOW (ref 22–32)
CO2: 19 mmol/L — ABNORMAL LOW (ref 22–32)
Calcium: 8.6 mg/dL — ABNORMAL LOW (ref 8.9–10.3)
Calcium: 8.5 mg/dL — ABNORMAL LOW (ref 8.9–10.3)
Chloride: 104 mmol/L (ref 98–111)
Chloride: 102 mmol/L (ref 98–111)
Creatinine, Ser: 1.11 mg/dL (ref 0.61–1.24)
Creatinine, Ser: 1.02 mg/dL (ref 0.61–1.24)
GFR, Estimated: >60 mL/min (ref >60)
GFR, Estimated: >60 mL/min (ref >60)
Glucose, Bld: 147 mg/dL — ABNORMAL HIGH (ref 70–99)
Glucose, Bld: 190 mg/dL — ABNORMAL HIGH (ref 70–99)
Potassium: 3.7 mmol/L (ref 3.5–5.1)
Potassium: 3.8 mmol/L (ref 3.5–5.1)
Sodium: 136 mmol/L (ref 135–145)
Sodium: 135 mmol/L (ref 135–145)
Total Bilirubin: 0.9 mg/dL (ref 0.0–1.2)
Total Bilirubin: 0.8 mg/dL (ref 0.0–1.2)
Total Protein: 7.7 g/dL (ref 6.5–8.1)
Total Protein: 7.4 g/dL (ref 6.5–8.1)

## 2024-06-18 LAB — ECHOCARDIOGRAM COMPLETE
AR max vel: 1.39 cm2
AV Area VTI: 1.52 cm2
AV Area mean vel: 1.44 cm2
AV Mean grad: 19 mmHg
AV Peak grad: 38.2 mmHg
Ao pk vel: 3.09 m/s
Area-P 1/2: 6.37 cm2
Height: 72 in
S' Lateral: 3.4 cm
Weight: 4160 [oz_av]

## 2024-06-18 LAB — URINALYSIS, W/ REFLEX TO CULTURE (INFECTION SUSPECTED)
Bilirubin Urine: NEGATIVE
Glucose, UA: NEGATIVE mg/dL
Ketones, ur: NEGATIVE mg/dL
Nitrite: NEGATIVE
Protein, ur: >=300 mg/dL — AB
Specific Gravity, Urine: 1.018 (ref 1.005–1.030)
WBC, UA: >50 WBC/hpf (ref 0–5)
pH: 5 (ref 5.0–8.0)

## 2024-06-18 LAB — GLUCOSE, CAPILLARY
Glucose-Capillary: 167 mg/dL — ABNORMAL HIGH (ref 70–99)
Glucose-Capillary: 213 mg/dL — ABNORMAL HIGH (ref 70–99)
Glucose-Capillary: 206 mg/dL — ABNORMAL HIGH (ref 70–99)
Glucose-Capillary: 146 mg/dL — ABNORMAL HIGH (ref 70–99)

## 2024-06-18 LAB — LACTIC ACID, PLASMA
Lactic Acid, Venous: 2.1 mmol/L (ref 0.5–1.9)
Lactic Acid, Venous: 1.7 mmol/L (ref 0.5–1.9)

## 2024-06-18 LAB — RESP PANEL BY RT-PCR (RSV, FLU A&B, COVID)  RVPGX2
Influenza A by PCR: NEGATIVE
Influenza B by PCR: NEGATIVE
Resp Syncytial Virus by PCR: NEGATIVE
SARS Coronavirus 2 by RT PCR: NEGATIVE

## 2024-06-18 LAB — PROCALCITONIN: Procalcitonin: 0.41 ng/mL

## 2024-06-18 LAB — TROPONIN T, HIGH SENSITIVITY
Troponin T High Sensitivity: 73 ng/L — ABNORMAL HIGH (ref 0–19)
Troponin T High Sensitivity: 75 ng/L — ABNORMAL HIGH (ref 0–19)

## 2024-06-18 MED ORDER — INSULIN ASPART 100 UNIT/ML IJ SOLN
3.0000 [IU] | Freq: Three times a day (TID) | INTRAMUSCULAR | Status: DC
  Administered 2024-06-18 – 2024-06-23 (×10): 3 [IU] via SUBCUTANEOUS
  Filled 2024-06-18 (×11): qty 3

## 2024-06-18 MED ORDER — INSULIN ASPART 100 UNIT/ML IJ SOLN
0.0000 [IU] | Freq: Every day | INTRAMUSCULAR | Status: DC
  Administered 2024-06-19: 3 [IU] via SUBCUTANEOUS
  Administered 2024-06-21 – 2024-06-22 (×2): 2 [IU] via SUBCUTANEOUS
  Filled 2024-06-18: qty 3
  Filled 2024-06-18 (×2): qty 2

## 2024-06-18 MED ORDER — RANOLAZINE ER 500 MG PO TB12
500.0000 mg | ORAL_TABLET | Freq: Two times a day (BID) | ORAL | Status: DC
  Administered 2024-06-18 – 2024-06-23 (×10): 500 mg via ORAL
  Filled 2024-06-18 (×10): qty 1

## 2024-06-18 MED ORDER — IOHEXOL 350 MG/ML SOLN
100.0000 mL | Freq: Once | INTRAVENOUS | Status: AC | PRN
  Administered 2024-06-18: 100 mL via INTRAVENOUS

## 2024-06-18 MED ORDER — ROSUVASTATIN CALCIUM 10 MG PO TABS
40.0000 mg | ORAL_TABLET | Freq: Every day | ORAL | Status: DC
  Administered 2024-06-18 – 2024-06-23 (×5): 40 mg via ORAL
  Filled 2024-06-18 (×5): qty 4

## 2024-06-18 MED ORDER — STROKE: EARLY STAGES OF RECOVERY BOOK: Freq: Once | Status: AC

## 2024-06-18 MED ORDER — LACTATED RINGERS IV BOLUS
2000.0000 mL | Freq: Once | INTRAVENOUS | Status: AC
  Administered 2024-06-18: 2000 mL via INTRAVENOUS

## 2024-06-18 MED ORDER — SODIUM CHLORIDE 0.9 % IV SOLN
500.0000 mg | INTRAVENOUS | Status: DC
  Filled 2024-06-18: qty 5

## 2024-06-18 MED ORDER — VANCOMYCIN HCL IN DEXTROSE 1-5 GM/200ML-% IV SOLN
1000.0000 mg | Freq: Once | INTRAVENOUS | Status: AC
  Administered 2024-06-18: 1000 mg via INTRAVENOUS
  Filled 2024-06-18: qty 200

## 2024-06-18 MED ORDER — HYDROXYZINE HCL 10 MG PO TABS
10.0000 mg | ORAL_TABLET | Freq: Every day | ORAL | Status: DC
  Administered 2024-06-18 – 2024-06-22 (×5): 10 mg via ORAL
  Filled 2024-06-18 (×6): qty 1

## 2024-06-18 MED ORDER — PERFLUTREN LIPID MICROSPHERE
1.0000 mL | INTRAVENOUS | Status: AC | PRN
  Administered 2024-06-18: 5 mL via INTRAVENOUS

## 2024-06-18 MED ORDER — INSULIN GLARGINE-YFGN 100 UNIT/ML ~~LOC~~ SOLN
25.0000 [IU] | Freq: Every day | SUBCUTANEOUS | Status: DC
  Administered 2024-06-18 – 2024-06-23 (×5): 25 [IU] via SUBCUTANEOUS
  Filled 2024-06-18 (×6): qty 0.25

## 2024-06-18 MED ORDER — TAMSULOSIN HCL 0.4 MG PO CAPS
0.4000 mg | ORAL_CAPSULE | Freq: Every day | ORAL | Status: DC
  Administered 2024-06-18 – 2024-06-23 (×5): 0.4 mg via ORAL
  Filled 2024-06-18 (×5): qty 1

## 2024-06-18 MED ORDER — INSULIN ASPART 100 UNIT/ML IJ SOLN
0.0000 [IU] | Freq: Three times a day (TID) | INTRAMUSCULAR | Status: AC
  Administered 2024-06-18: 3 [IU] via SUBCUTANEOUS
  Administered 2024-06-19: 1 [IU] via SUBCUTANEOUS
  Administered 2024-06-19 (×2): 2 [IU] via SUBCUTANEOUS
  Administered 2024-06-20: 1 [IU] via SUBCUTANEOUS
  Administered 2024-06-20 (×2): 2 [IU] via SUBCUTANEOUS
  Administered 2024-06-22: 3 [IU] via SUBCUTANEOUS
  Administered 2024-06-22: 2 [IU] via SUBCUTANEOUS
  Administered 2024-06-22: 1 [IU] via SUBCUTANEOUS
  Administered 2024-06-23: 3 [IU] via SUBCUTANEOUS
  Administered 2024-06-23: 5 [IU] via SUBCUTANEOUS
  Filled 2024-06-18: qty 3
  Filled 2024-06-18: qty 2
  Filled 2024-06-18: qty 1
  Filled 2024-06-18: qty 2
  Filled 2024-06-18: qty 3
  Filled 2024-06-18 (×3): qty 2
  Filled 2024-06-18 (×2): qty 1
  Filled 2024-06-18: qty 5

## 2024-06-18 MED ORDER — SODIUM CHLORIDE 0.9 % IV SOLN
2.0000 g | Freq: Once | INTRAVENOUS | Status: AC
  Administered 2024-06-18: 2 g via INTRAVENOUS
  Filled 2024-06-18: qty 12.5

## 2024-06-18 MED ORDER — METOPROLOL SUCCINATE ER 25 MG PO TB24
12.5000 mg | ORAL_TABLET | Freq: Two times a day (BID) | ORAL | Status: DC
  Administered 2024-06-18 – 2024-06-23 (×10): 12.5 mg via ORAL
  Filled 2024-06-18 (×10): qty 1

## 2024-06-18 MED ORDER — VANCOMYCIN HCL 1500 MG/300ML IV SOLN
1500.0000 mg | Freq: Once | INTRAVENOUS | Status: AC
  Administered 2024-06-18: 1500 mg via INTRAVENOUS
  Filled 2024-06-18: qty 300

## 2024-06-18 MED ORDER — ENOXAPARIN SODIUM 40 MG/0.4ML IJ SOSY: 40.0000 mg | PREFILLED_SYRINGE | INTRAMUSCULAR | Status: DC

## 2024-06-18 MED ORDER — FUROSEMIDE 40 MG PO TABS
40.0000 mg | ORAL_TABLET | Freq: Two times a day (BID) | ORAL | Status: DC
  Administered 2024-06-18 – 2024-06-23 (×9): 40 mg via ORAL
  Filled 2024-06-18 (×9): qty 1

## 2024-06-18 MED ORDER — METRONIDAZOLE 500 MG/100ML IV SOLN
500.0000 mg | Freq: Once | INTRAVENOUS | Status: AC
  Administered 2024-06-18: 500 mg via INTRAVENOUS
  Filled 2024-06-18: qty 100

## 2024-06-18 MED ORDER — ENOXAPARIN SODIUM 60 MG/0.6ML IJ SOSY
0.5000 mg/kg | PREFILLED_SYRINGE | INTRAMUSCULAR | Status: DC
  Administered 2024-06-18 – 2024-06-23 (×6): 60 mg via SUBCUTANEOUS
  Filled 2024-06-18 (×6): qty 0.6

## 2024-06-18 MED ORDER — NYSTATIN 100000 UNIT/GM EX CREA
TOPICAL_CREAM | Freq: Two times a day (BID) | CUTANEOUS | Status: DC
  Administered 2024-06-21: 1 via TOPICAL
  Filled 2024-06-18 (×2): qty 30

## 2024-06-18 MED ORDER — SODIUM CHLORIDE 0.9 % IV SOLN
2.0000 g | Freq: Three times a day (TID) | INTRAVENOUS | Status: DC
  Administered 2024-06-18 (×2): 2 g via INTRAVENOUS
  Filled 2024-06-18 (×3): qty 12.5

## 2024-06-18 MED ORDER — SODIUM CHLORIDE 0.9 % IV SOLN
500.0000 mg | Freq: Once | INTRAVENOUS | Status: AC
  Administered 2024-06-18: 500 mg via INTRAVENOUS
  Filled 2024-06-18: qty 5

## 2024-06-18 MED ORDER — GABAPENTIN 100 MG PO CAPS
100.0000 mg | ORAL_CAPSULE | Freq: Three times a day (TID) | ORAL | Status: DC
  Administered 2024-06-18 – 2024-06-23 (×14): 100 mg via ORAL
  Filled 2024-06-18 (×14): qty 1

## 2024-06-18 NOTE — ED Notes (Signed)
 The pt had multiple episodes of watery stool while laying in the bed. The pt was able to role and was cleaned without incident. The bed was changed and a clean brief was placed without incident.

## 2024-06-18 NOTE — ED Notes (Signed)
 Leotis, EMT aware of ready bed

## 2024-06-18 NOTE — ED Notes (Signed)
 Trial on room air per MD, O2 sat dropped to 88% placed back on 2LNC O2 sat back up to 95%

## 2024-06-18 NOTE — ED Provider Notes (Addendum)
 [facility] Provider Note    Event Date/Time   First MD Initiated Contact with Patient 06/18/24 [phone]     (approximate)   History   Fall (Patient brought in by EMS had fall. His back hit door. Unknown if LOC or on coag medication.EMS reported Strong urine and concern of UTI/sepsis. Patient had been down for 15 mins before daughter arrived home and called EMS. Arrived with 20 in Cross thumb. EMS reported glucose of 144, vitals of 123/60, temp auxiliary of 102.3, RR 30, oxygen on RA of 91, oxygen on 2 liters 94, HR 114, BP of 123/60. EMS gave 1 gram of acetaminophen , 500 LR.  )   HPI  Jonathan Cross. is a 78 y.o. male   Past medical history of MDS, CAD, diabetes, aortic valve replacement, hypertension, hyperlipidemia, paroxysmal atrial fibrillation but does not appear to be on anticoagulation, recurrent skin abscesses, history of MRSA, here with concern for sepsis.    Feeling unwell with mild cough, dysuria for the last several days.    While trying to go to the bathroom today, felt very weak and collapsed.  No loss of consciousness no significant injuries noted by patient.  Slower to respond to my questioning.  Unclear baseline mentation.  States that he is on oxygen at home as needed.  Oxygen currently 89%.  Improved on 2 L  External Medical Documents Reviewed: Previous hospital notes      Physical Exam   Triage Vital Signs: ED Triage Vitals  Encounter Vitals Group     BP --      Girls Systolic BP Percentile --      Girls Diastolic BP Percentile --      Boys Systolic BP Percentile --      Boys Diastolic BP Percentile --      Pulse --      Resp --      Temp --      Temp src --      SpO2 --      Weight --      Height 06/18/24 0347 5' 9 (1.753 m)     Head Circumference --      Peak Flow --      Pain Score 06/18/24 0342 7     Pain Loc --      Pain Education --      Exclude from Growth Chart --     Most recent vital signs: Vitals:    06/18/24 0511 06/18/24 0530  BP: 117/65 (!) 129/57  Pulse: 98 95  Resp: (!) 22 15  Temp: 98.7 F (37.1 C) 98.8 F (37.1 C)  SpO2: 96% 96%    General: Awake, no distress.  CV:  Good peripheral perfusion.  Resp:  Normal effort. Abd:  No distention.  Other:  Borderline hypotensive, tachycardic.  Febrile per EMS.  Moving all extremities, generalized weakness but nothing focal.  Slow to answer questions but answering questions appropriately.  Mild right sided mid abdomen tenderness.  No rigidity or guarding.  No focality to lung exam.  No wheezing.  Head to toe skin examination showed only a small skin abrasion to the right knee otherwise no obvious infectious changes.  Able to range neck fully.    ED Results / Procedures / Treatments   Labs (all labs ordered are listed, but only abnormal results are displayed) Labs Reviewed  LACTIC ACID, PLASMA - Abnormal; Notable for the following components:      Result  Value   Lactic Acid, Venous 2.1 (*)    All other components within normal limits  COMPREHENSIVE METABOLIC PANEL WITH GFR - Abnormal; Notable for the following components:   CO2 20 (*)    Glucose, Bld 147 (*)    Calcium  8.6 (*)    All other components within normal limits  CBC WITH DIFFERENTIAL/PLATELET - Abnormal; Notable for the following components:   WBC 16.3 (*)    RBC 2.54 (*)    Hemoglobin 9.3 (*)    HCT 28.9 (*)    MCV 113.8 (*)    MCH 36.6 (*)    RDW 15.7 (*)    Platelets 140 (*)    Neutro Abs 13.5 (*)    Monocytes Absolute 1.7 (*)    Abs Immature Granulocytes 0.28 (*)    All other components within normal limits  PROTIME-INR - Abnormal; Notable for the following components:   Prothrombin Time 17.0 (*)    INR 1.3 (*)    All other components within normal limits  URINALYSIS, W/ REFLEX TO CULTURE (INFECTION SUSPECTED) - Abnormal; Notable for the following components:   Color, Urine AMBER (*)    APPearance CLOUDY (*)    Hgb urine dipstick MODERATE (*)     Protein, ur >=300 (*)    Leukocytes,Ua MODERATE (*)    Bacteria, UA MANY (*)    All other components within normal limits  TROPONIN T, HIGH SENSITIVITY - Abnormal; Notable for the following components:   Troponin T High Sensitivity 73 (*)    All other components within normal limits  RESP PANEL BY RT-PCR (RSV, FLU A&B, COVID)  RVPGX2  CULTURE, BLOOD (ROUTINE X 2)  CULTURE, BLOOD (ROUTINE X 2)  URINE CULTURE  LACTIC ACID, PLASMA  TROPONIN T, HIGH SENSITIVITY     I ordered and reviewed the above labs they are notable for leukocytosis and mildly elevated lactic acid.  Troponin 73.  EKG  ED ECG REPORT I, Ginnie Shams, the attending physician, personally viewed and interpreted this ECG.   Date: 06/18/2024  EKG Time: 0340  Rate: 108  Rhythm: sinus tachycardia  Axis: nl  Intervals:nl  ST&T Change: no stemi    RADIOLOGY I independently reviewed and interpreted chest x-ray and see patchy mid left lung opacities concerning for pneumonia. I also reviewed radiologist's formal read.   PROCEDURES:  Critical Care performed: Yes, see critical care procedure note(s)  .Critical Care  Performed by: Shams Ginnie, MD Authorized by: Shams Ginnie, MD   Critical care provider statement:    Critical care time (minutes):  40   Critical care was time spent personally by me on the following activities:  Development of treatment plan with patient or surrogate, discussions with consultants, evaluation of patient's response to treatment, examination of patient, ordering and review of laboratory studies, ordering and review of radiographic studies, ordering and performing treatments and interventions, pulse oximetry, re-evaluation of patient's condition and review of old charts    MEDICATIONS ORDERED IN ED: Medications  ceFEPIme  (MAXIPIME ) 2 g in sodium chloride  0.9 % 100 mL IVPB (2 g Intravenous New Bag/Given 06/18/24 0543)  vancomycin  (VANCOCIN ) IVPB 1000 mg/200 mL premix (has no administration in  time range)    Followed by  vancomycin  (VANCOREADY) IVPB 1500 mg/300 mL (has no administration in time range)  metroNIDAZOLE  (FLAGYL ) IVPB 500 mg (500 mg Intravenous New Bag/Given 06/18/24 0427)  lactated ringers  bolus 2,000 mL (2,000 mLs Intravenous New Bag/Given 06/18/24 0426)  iohexol  (OMNIPAQUE ) 350 MG/ML injection 100 mL (  100 mLs Intravenous Contrast Given 06/18/24 9487)    External physician / consultants:  I spoke with hospital medicine regarding care plan for this patient.   IMPRESSION / MDM / ASSESSMENT AND PLAN / ED COURSE  I reviewed the triage vital signs and the nursing notes.                                Patient's presentation is most consistent with acute presentation with potential threat to life or bodily function.  Differential diagnosis includes, but is not limited to, sepsis due to respiratory infection, urine infection, skin infection, intra-abdominal infection, considered but less likely meningitis   The patient is on the cardiac monitor to evaluate for evidence of arrhythmia and/or significant heart rate changes.  MDM:    Concern for sepsis given vital sign abnormalities leukocytosis lactic acidosis, evidence of hypoperfusion so given full 30 cc/kg ideal body weight fluid bolus as well as broad-spectrum antibiotics on the onset.  Broad differential diagnosis in terms of etiology of infection given his cough could represent respiratory corroborated by CXR with evidence of left-sided opacity, as well as abdominal tenderness could be intra-abdominal, as well as foul-smelling urine reported by EMS could be urinary.  Recurrent skin abscesses but no evidence of skin infection currently.  Will get CT scan chest abdomen pelvis for further clarification of source of infection.  Standard sepsis labs.  CT head given his fall unknown head strike and altered mental status.   Admission.  Sepsis reevaluation-vital signs improving.   -- CT head shows no bleeding or traumatic  injury but reveals an age-indeterminate right occipital lobe infarct.  No gross neurologic deficits and was able to text his finger-to-nose coordination which is normal.  Previous CT head earlier this year from [facility] does note a chronic appearing infarct in the body of the right caudate nucleus but nothing specifically in the occipital lobe on the right side.  Unclear about the timing of this new finding of right occipital lobe infarct but it does not appear to manifest any gross neurologic deficits.  He started feeling unwell more than 2 days ago and so with the time window, no gross neurologic deficits, I do not believe that he is a thrombolytic candidate.  When his severe sepsis has stabilized perhaps would benefit from neurology consult while inpatient will defer to inpatient team.     FINAL CLINICAL IMPRESSION(S) / ED DIAGNOSES   Final diagnoses:  Severe sepsis (HCC)  Community acquired pneumonia of left lung, unspecified part of lung  Acute cystitis without hematuria  Occipital stroke (HCC)     Rx / DC Orders   ED Discharge Orders     None        Note:  This document was prepared using Dragon voice recognition software and may include unintentional dictation errors.    Cyrena Mylar, MD 06/18/24 0500    Cyrena Mylar, MD 06/18/24 9487    Cyrena Mylar, MD 06/18/24 9486    Cyrena Mylar, MD 06/18/24 [phone]

## 2024-06-18 NOTE — ED Notes (Signed)
 Ritchey for 275 ml cloudy dark yellow urine

## 2024-06-18 NOTE — Progress Notes (Signed)
  Echocardiogram 2D Echocardiogram has been performed. Definity  IV ultrasound imaging agent used on this study.  Jonathan Cross 06/18/2024, 6:06 PM

## 2024-06-18 NOTE — Evaluation (Signed)
 Occupational Therapy Evaluation Patient Details Name: Jonathan Cross. MRN: [id number] DOB: 1946/02/28 Today's Date: 06/18/2024   History of Present Illness   Pt is an 78 year old male presented to the ED after a fall at home, admitted with UTI, sepsis, CAP, also undergoing stroke work up     PMH significant for diabetes, HTN, GERD, HLD, 2 of bacterial endocarditis, CAD s/p aortic valve replacement, A-fib not on anticoagulation, recurrent skin abscesses, history of MRSA     Clinical Impressions Chart reviewed to date, pt greeted semi supine in bed, oriented to self and place. He requires multi modal cues for task participation with decreased initiation noted on this date. PTA Pt is grossly MOD I-I in ADL, has assist for IADL. 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 Pt presents with deficits in strength, endurance, activity tolerance, balance, cognition, affecting safe and optimal ADL completion. He requires MIN A for bed mobility, MOD A for STS with HHA, poor static standing for more than 10 seconds with pt requesting to return to bed. Supervision for feeding/grooming tasks at bed level (aspiration precautions).Pt is performing ADL/functional mobilty below PLOF, will benefit from acute OT to address functional deficits and to facilitate optimal ADL/functional mobility performance.      If plan is discharge home, recommend the following:   A lot of help with bathing/dressing/bathroom;A lot of help with walking and/or transfers     Functional Status Assessment   Patient has had a recent decline in their functional status and demonstrates the ability to make significant improvements in function in a reasonable and predictable amount of time.     Equipment Recommendations   Other (comment) (defer to next venue of care)     Recommendations for Other Services          Precautions/Restrictions   Precautions Precautions: Fall Recall of Precautions/Restrictions: Impaired Restrictions Weight Bearing Restrictions Per Provider Order: No     Mobility Bed Mobility Overal bed mobility: Needs Assistance Bed Mobility: Supine to Sit, Sit to Supine  Rolling: MIN A multiple attempts    Supine to sit: Min assist, HOB elevated, Used rails Sit to supine: Min assist, Used rails   General bed mobility comments: frequent vcs for technique    Transfers Overall transfer level: Needs assistance Equipment used: 1 person hand held assist Transfers: Sit to/from Stand, Bed to chair/wheelchair/BSC Sit to Stand: Mod assist          Lateral/Scoot Transfers: Min assist (to the R up the bed)        Balance Overall balance assessment: Needs assistance Sitting-balance support: Feet supported Sitting balance-Leahy Scale: Good     Standing balance support: Single extremity supported, During functional activity Standing balance-Leahy Scale: Poor Standing balance comment: sustained static standing approx 15 seconds                           ADL either performed or assessed with clinical judgement   ADL Overall ADL's : Needs assistance/impaired Eating/Feeding: Supervision/ safety;Bed level;Sitting   Grooming: Wash/dry face;Supervision/safety;Sitting               Lower Body Dressing: Maximal assistance                       Vision Patient Visual Report: No change from baseline Additional Comments: Pt  with decreased attention for formal vision assessment, willcontinue to asses he reports no vision changes at the moment     Perception         Praxis         Pertinent Vitals/Pain Pain Assessment Pain Assessment: Faces Faces Pain Scale: Hurts little more Pain Location: genrealized, L trunk/chest area Pain Descriptors / Indicators: Discomfort, Grimacing Pain Intervention(s): Repositioned, Monitored during session (nurse in  room at time of complaint)     Extremity/Trunk Assessment Upper Extremity Assessment Upper Extremity Assessment: Generalized weakness;RUE deficits/detail;LUE deficits/detail RUE Deficits / Details: AROM grossly WFL throughout; no focal deficits noted LUE Deficits / Details: AROM grossly WFL throughout; no focal deficits noted LUE Sensation:  (will continue to assess) LUE Coordination: WNL   Lower Extremity Assessment Lower Extremity Assessment: Generalized weakness       Communication Communication Communication: Impaired Factors Affecting Communication: Hearing impaired   Cognition Arousal: Alert Behavior During Therapy: Anxious Cognition: Cognition impaired   Orientation impairments: Situation Awareness: Intellectual awareness impaired Memory impairment (select all impairments): Short-term memory Attention impairment (select first level of impairment): Sustained attention Executive functioning impairment (select all impairments): Reasoning, Problem solving                   Following commands: Impaired Following commands impaired: Follows one step commands with increased time     Cueing  General Comments   Cueing Techniques: Verbal cues;Tactile cues;Visual cues  vss   Exercises Other Exercises Other Exercises: edu re role of OT, role of rehab, discharge recommendations   Shoulder Instructions      Home Living Family/patient expects to be discharged to:: Private residence Living Arrangements: Children (daugther and grandson) Available Help at Discharge: Family;Available PRN/intermittently Type of Home: House Home Access: Ramped entrance     Home Layout: One level     Bathroom Shower/Tub: Chief Strategy Officer: Standard Bathroom Accessibility: No   Home Equipment: Grab bars - tub/shower;Rolling Walker (2 wheels);Shower seat;Cane - single point;Rollator (4 wheels)   Additional Comments: has home 02 that he does not wear per son  report      Prior Functioning/Environment Prior Level of Function : Needs assist;History of Falls (last six months)             Mobility Comments: amb with rollator household/short community ditsances but pt also endorses he will walk without AD and furniture surf ADLs Comments: Pt reports MOD I-I in ADL, assist for IADL but he does drive/work (picks up car parts)    OT Problem List: Decreased strength;Impaired balance (sitting and/or standing);Decreased activity tolerance;Decreased knowledge of use of DME or AE;Decreased safety awareness   OT Treatment/Interventions: Self-care/ADL training;Therapeutic exercise;Therapeutic activities;Energy conservation;DME and/or AE instruction;Balance training;Patient/family education;Neuromuscular education;Cognitive remediation/compensation      OT Goals(Current goals can be found in the care plan section)   Acute Rehab OT Goals Patient Stated Goal: go home OT Goal Formulation: With patient Time For Goal Achievement: 07/02/24 Potential to Achieve Goals: Good ADL Goals Pt Will Perform Grooming: with modified independence;sitting;standing Pt Will Perform Lower Body Dressing: with modified independence;sitting/lateral leans;sit to/from stand Pt Will Transfer to Toilet: with modified independence;ambulating Pt Will Perform Toileting - Clothing Manipulation and hygiene: with modified independence;sitting/lateral leans;sit to/from stand   OT Frequency:  Min 2X/week    Co-evaluation              AM-PAC OT 6 Clicks Daily Activity     Outcome Measure Help from another person eating meals?: A  Little Help from another person taking care of personal grooming?: A Little Help from another person toileting, which includes using toliet, bedpan, or urinal?: A Lot Help from another person bathing (including washing, rinsing, drying)?: A Lot Help from another person to put on and taking off regular upper body clothing?: A Little Help from  another person to put on and taking off regular lower body clothing?: A Lot 6 Click Score: 15   End of Session Equipment Utilized During Treatment: Oxygen Nurse Communication: Mobility status  Activity Tolerance: Patient tolerated treatment well Patient left: in bed;with call bell/phone within reach;with bed alarm set;with nursing/sitter in room  OT Visit Diagnosis: Other abnormalities of gait and mobility (R26.89);Muscle weakness (generalized) (M62.81)                Time: 8557-8496 OT Time Calculation (min): 21 min Charges:  OT General Charges $OT Visit: 1 Visit OT Evaluation $OT Eval Moderate Complexity: 1 Mod Therisa Sheffield, OTD OTR/L  06/18/24, 3:15 PM

## 2024-06-18 NOTE — H&P (Signed)
 History and Physical    Jonathan Cross. FMW:[id number] DOB: 06-Jul-1946 DOA: 06/18/2024  DOS: the patient was seen and examined on 06/18/2024  PCP: Minor, Mason, PA   Patient coming from: Home  I have personally briefly reviewed patient's old medical records in [facility]  Chief Complaint: Fall  HPI: Jonathan Cross. is a pleasant 78 y.o. male with medical history significant for diabetes, HTN, GERD, HLD, 2 of bacterial endocarditis, CAD s/p aortic valve replacement, A-fib not on anticoagulation, recurrent skin abscesses, history of MRSA who came in to ED complaining of a fall at home. Per EMS patient hit his head, unknown if he had LOC or anticoagulation.  EMS reported strong urine smell and concern for UTI/sepsis.  Patient had been down for 15 minutes before daughter arrived home and called EMS.  EMS reported a glucose of 144, vital signs 123/60, temperature 102.2 0.3, RR 30, oxygen 91% RA and 94% on 2 L, BP of 123/60.  EMS gave 1 g of acetaminophen , 500 cc of LR. Patient stated that he was not feeling well for the last 2 days.  He had mild cough, some shortness of breath and felt febrile.  He also complains some difficulty urinating for the last week.  While he was trying to go to the bathroom he felt weak and collapsed.  He hit his head but stated that he did not lose his consciousness.  Patient has a slow response to the questions but answers appropriately. He denies any nausea vomiting, abdominal pain, chest pain, shortness of breath, palpitations.  ED Course: Upon arrival to the ED, patient is found to be tachypneic at 22, blood pressure 129/57, 98.7 F temperature, white count 16.3, hemoglobin 9.3, hematocrit 28.9, glucose 147, calcium  8.6, bicarb 20, lactic acid was 2.1, urine was positive for UTI, troponin was elevated to 73.  Chest x-ray showed left-sided infiltrate.  CT scan of the head was without acute pathology.  But has a is undetermined at right occipital lobe  infarct.  CT scan of the C-spine was negative for fractures.  CT scan of the abdomen and pelvis cholelithiasis and bilateral lung infiltrate.  Patient was given antibiotic for COVID acquired pneumonia, Flagyl  vancomycin , azithromycin  and cefepime .  Hospitalist service was consulted for evaluation for admission for sepsis, pneumonia, UTI, possible stroke.  Review of Systems:  ROS  All other systems negative except as noted in the HPI.  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 04/2022 Echo: EF 60-65%, GrII DD; b. 06/2022 RHC: PA 50/20(30), PCWP 25; c. 11/2022 Echo: EF 60-65%, mild LVH, GrI DD. mildly reduced RV fxn, mild MR, nl fxn'ing bioprosthetic AoV; d. 01/2023 Echo: EF 60-65%, nl fxn'ing AoV, prob mild AS. Nl RV fxn; e. 08/2023 Echo: EF 65-70%, no rwma, GrI DD, mildly reduced RV fxn, triv MR, Mild AS. Asc Ao 39mm.   Coronary artery disease    a. 01/2016 CABG x 2: LIMA->LAD, VG->OM; b. 05/2021 MV: small, mild, rev apical lateral and apical inf defects ->subtle ischemia vs artifact, EF 55-65%-->low risk; c. 06/2022 Cath: LM 20d, LAD 80ost, 60p/m, 58m, D1 mild dzs, LCX 50ost/p, OM1 mod dzs, RCA mild diff dzs, RPL1 75, VG->OM2 & LIMA->LAD patent--> Med Rx.   CVA, old, disturbances of vision    Diabetes (HCC)    H/O aortic valve replacement    a. 01/2016 s/p bioprosthetic AVR @  [facility] for Severe AS; b. 05/2021 Echo: EF 55-60%, nl fxn of bioprosthetic AVR; c. 04/2022 Echo: EF 60-65%, nl fxn'ing AoV; d. 11/2022 Echo: nl fxn'ing AoV; e. 01/2023 Echo: nl fxn'ing Aov, mean grad ; f. 08/2023 Echo: Mild AS (mean grad ).   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 CHA2DS2VASc = 5-->eliquis nori; b. 01/2023 Amio d/c'd  due to concern re: lung toxicity/ongoing dyspnea.   Retinal vascular occlusion of left eye    Several years ago   Sleep apnea    BiPAP   Vertigo    Wears dentures    full upper and lower.  Lower broken    Past Surgical History:  Procedure Laterality Date   AORTIC VALVE REPLACEMENT  2017   [facility], bioprosthetic   APPENDECTOMY     BONE MARROW BIOPSY     05/2022   CARDIAC VALVE REPLACEMENT     CATARACT EXTRACTION W/PHACO Right 11/12/2022   Procedure: CATARACT EXTRACTION PHACO AND INTRAOCULAR LENS PLACEMENT (IOC) RIGHT DIABETIC MALYUGIN  13.49  01:06.1;  Surgeon: Mittie Gaskin, MD;  Location: [facility];  Service: Ophthalmology;  Laterality: Right;  Diabetic   CORONARY ARTERY BYPASS GRAFT  2017   [facility] - LIMA-LAD and SVG-OM   CYSTOSCOPY W/ RETROGRADES Right 05/27/2019   Procedure: CYSTOSCOPY WITH RETROGRADE PYELOGRAM;  Surgeon: Francisca Redell BROCKS, MD;  Location: [facility];  Service: Urology;  Laterality: Right;   CYSTOSCOPY W/ RETROGRADES Right 07/11/2019   Procedure: CYSTOSCOPY WITH RETROGRADE PYELOGRAM;  Surgeon: Francisca Redell BROCKS, MD;  Location: [facility];  Service: Urology;  Laterality: Right;   CYSTOSCOPY/URETEROSCOPY/HOLMIUM LASER/STENT PLACEMENT Right 05/27/2019   Procedure: CYSTOSCOPY/URETEROSCOPY/HOLMIUM LASER/STENT PLACEMENT;  Surgeon: Francisca Redell BROCKS, MD;  Location: [facility];  Service: Urology;  Laterality: Right;   CYSTOSCOPY/URETEROSCOPY/HOLMIUM LASER/STENT PLACEMENT Right 06/17/2019   Procedure: CYSTOSCOPY/URETEROSCOPY/HOLMIUM LASER/STENT Exchange;  Surgeon: Francisca Redell BROCKS, MD;  Location: [facility];  Service: Urology;  Laterality: Right;   CYSTOSCOPY/URETEROSCOPY/HOLMIUM LASER/STENT PLACEMENT Right 07/11/2019   Procedure: CYSTOSCOPY/URETEROSCOPY/HOLMIUM LASER/STENT Exchange;  Surgeon: Francisca Redell BROCKS, MD;  Location: [facility];  Service: Urology;  Laterality: Right;   RIGHT HEART CATH AND CORONARY/GRAFT ANGIOGRAPHY Bilateral 06/17/2022   Procedure: RIGHT HEART CATH  AND CORONARY/GRAFT ANGIOGRAPHY;  Surgeon: Mady Bruckner, MD;  Location: [facility];  Service: Cardiovascular;  Laterality: Bilateral;   STONE EXTRACTION WITH BASKET Right 07/11/2019   Procedure: STONE EXTRACTION WITH BASKET;  Surgeon: Francisca Redell BROCKS, MD;  Location: [facility];  Service: Urology;  Laterality: Right;   TEE WITHOUT CARDIOVERSION N/A 04/21/2019   Procedure: TRANSESOPHAGEAL ECHOCARDIOGRAM (TEE);  Surgeon: Perla Evalene PARAS, MD;  Location: [facility];  Service: Cardiovascular;  Laterality: N/A;   TEE WITHOUT CARDIOVERSION N/A 07/04/2019   Procedure: TRANSESOPHAGEAL ECHOCARDIOGRAM (TEE);  Surgeon: Darron Deatrice LABOR, MD;  Location: [facility];  Service: Cardiovascular;  Laterality: N/A;   TEE WITHOUT CARDIOVERSION N/A 08/17/2019   Procedure: TRANSESOPHAGEAL ECHOCARDIOGRAM (TEE);  Surgeon: Darliss Redell, MD;  Location: [facility];  Service: Cardiovascular;  Laterality: N/A;   TEE WITHOUT CARDIOVERSION N/A 11/15/2019   Procedure: TRANSESOPHAGEAL ECHOCARDIOGRAM (TEE);  Surgeon: Mady Bruckner, MD;  Location: [facility];  Service: Cardiovascular;  Laterality: N/A;   TONSILLECTOMY       reports that he quit smoking about 55 years ago. His smoking use included cigarettes.  He has never used smokeless tobacco. He reports that he does not drink alcohol and does not use drugs.  Allergies  Allergen Reactions   Metformin Diarrhea and Anaphylaxis    Loose stools even with XR    Family History  Problem Relation Age of Onset   Heart attack Father 42   Heart attack Sister     Prior to Admission medications   Medication Sig Start Date End Date Taking? Authorizing Provider  acetaminophen  (TYLENOL ) 500 MG tablet Take 2 tablets (1,000 mg total) by mouth every 8 (eight) hours. 12/15/23  Yes Fausto Burnard LABOR, DO  clotrimazole-betamethasone (LOTRISONE) cream Apply 1 Application topically 2 (two) times daily. 04/20/24  Yes [provider]  furosemide  (LASIX ) 40 MG tablet Take 1  tablet (40 mg total) by mouth 2 (two) times daily. Take 2 tablets or 80 mg in the morning and 40 mg in afternoon 08/25/23 08/24/24 Yes Amin, Sumayya, MD  gabapentin  (NEURONTIN ) 100 MG capsule Take 100 mg by mouth 3 (three) times daily. 02/05/24 02/04/25 Yes [provider]  hydrOXYzine  (ATARAX ) 10 MG tablet Take 10 mg by mouth at bedtime. 04/04/24  Yes [provider]  insulin  NPH-regular Human (70-30) 100 UNIT/ML injection Inject 100 Units into the skin 2 (two) times daily. 06/02/23  Yes Zhang, Dekui, MD  ketoconazole (NIZORAL) 2 % cream Apply 1 Application topically 2 (two) times daily. 04/05/24  Yes [provider]  ketoconazole (NIZORAL) 2 % shampoo Apply 1 Application topically 2 (two) times a week. 02/05/24  Yes [provider]  meclizine  (ANTIVERT ) 12.5 MG tablet Take 12.5 mg by mouth every 8 (eight) hours as needed. 11/20/23  Yes [provider]  metoprolol  succinate (TOPROL -XL) 25 MG 24 hr tablet Take 12.5 mg by mouth 2 (two) times daily. 12/08/18 09/08/24 Yes [provider]  nitroGLYCERIN  (NITROSTAT ) 0.4 MG SL tablet Place 1 tablet (0.4 mg total) under the tongue every 5 (five) minutes x 3 doses as needed for chest pain. 08/12/22  Yes Sreenath, Sudheer B, MD  omeprazole  (PRILOSEC) 20 MG capsule Take 2 capsules (40 mg total) by mouth daily. 04/28/24  Yes End, Lonni, MD  polyethylene glycol powder (GLYCOLAX /MIRALAX ) 17 GM/SCOOP powder Take 17 g by mouth daily. 12/15/23  Yes Fausto Burnard A, DO  potassium chloride  SA (KLOR-CON  M) 20 MEQ tablet Take 20 mEq by mouth daily. 03/31/24  Yes [provider]  ranolazine  (RANEXA ) 500 MG 12 hr tablet Take 1 tablet by mouth twice daily 05/10/24  Yes End, Lonni, MD  rosuvastatin  (CRESTOR ) 40 MG tablet Take 40 mg by mouth daily. 03/22/24 03/22/25 Yes [provider]  tamsulosin  (FLOMAX ) 0.4 MG CAPS capsule Take 0.4 mg by mouth daily. 03/06/21  Yes [provider]  senna-docusate  (SENOKOT-S) 8.6-50 MG tablet Take 1 tablet by mouth 2 (two) times daily. Patient not taking: Reported on 03/31/2024 12/15/23   Fausto Burnard LABOR, DO    Physical Exam: Vitals:   06/18/24 0530 06/18/24 0630 06/18/24 1016 06/18/24 1038  BP: (!) 129/57 130/67 (!) 106/49 (!) 142/69  Pulse: 95 93 90 93  Resp: 15 18 18    Temp: 98.8 F (37.1 C) 97.9 F (36.6 C)    TempSrc: Oral Oral    SpO2: 96% 97% 95% 100%  Weight:      Height:        Physical Exam   Constitutional: Alert, awake, calm, comfortable HEENT: Neck supple Respiratory: Bilateral decreased air entry at the bases, scattered wheezes, no rales no rhonchi  Cardiovascular: Regular rate and rhythm, no murmurs / rubs / gallops. No extremity edema. 2+ pedal pulses. No carotid bruits.  Abdomen: Soft, no tenderness, Bowel sounds positive.  Obese abdomen Musculoskeletal: no clubbing / cyanosis. Good ROM, no contractures. Normal muscle tone.  Skin: Chronic skin healed lesions. Neurologic: CN 2-12 grossly intact. Sensation intact, No focal deficit identified Psychiatric: Alert and oriented x 3. Normal mood.    Labs on Admission: I have personally reviewed following labs and imaging studies  CBC: Recent Labs  Lab 06/18/24 0358  WBC 16.3*  NEUTROABS 13.5*  HGB 9.3*  HCT 28.9*  MCV 113.8*  PLT 140*   Basic Metabolic Panel: Recent Labs  Lab 06/18/24 0358  NA 136  K 3.7  CL 104  CO2 20*  GLUCOSE 147*  BUN 14  CREATININE 1.11  CALCIUM  8.6*   GFR: Estimated Creatinine Clearance: 70.3 mL/min (by C-G formula based on SCr of 1.11 mg/dL). Liver Function Tests: Recent Labs  Lab 06/18/24 0358  AST 20  ALT 14  ALKPHOS 59  BILITOT 0.9  PROT 7.7  ALBUMIN 3.6   No results for input(s): LIPASE, AMYLASE in the last 168 hours. No results for input(s): AMMONIA in the last 168 hours. Coagulation Profile: Recent Labs  Lab 06/18/24 0358  INR 1.3*   Cardiac Enzymes: No results for input(s): CKTOTAL, CKMB,  CKMBINDEX, TROPONINI, TROPONINIHS in the last 168 hours. BNP (last 3 results) Recent Labs    08/25/23 1440  BNP 105.7*   HbA1C: No results for input(s): HGBA1C in the last 72 hours. CBG: No results for input(s): GLUCAP in the last 168 hours. Lipid Profile: No results for input(s): CHOL, HDL, LDLCALC, TRIG, CHOLHDL, LDLDIRECT in the last 72 hours. Thyroid Function Tests: No results for input(s): TSH, T4TOTAL, FREET4, T3FREE, THYROIDAB in the last 72 hours. Anemia Panel: No results for input(s): VITAMINB12, FOLATE, FERRITIN, TIBC, IRON , RETICCTPCT in the last 72 hours. Urine analysis:    Component Value Date/Time   COLORURINE AMBER (A) 06/18/2024 0358   APPEARANCEUR CLOUDY (A) 06/18/2024 0358   APPEARANCEUR Cloudy (A) 06/07/2019 1409   LABSPEC 1.018 06/18/2024 0358   PHURINE 5.0 06/18/2024 0358   GLUCOSEU NEGATIVE 06/18/2024 0358   HGBUR MODERATE (A) 06/18/2024 0358   BILIRUBINUR NEGATIVE 06/18/2024 0358   BILIRUBINUR Negative 06/07/2019 1409   KETONESUR NEGATIVE 06/18/2024 0358   PROTEINUR >=300 (A) 06/18/2024 0358   NITRITE NEGATIVE 06/18/2024 0358   LEUKOCYTESUR MODERATE (A) 06/18/2024 0358    Radiological Exams on Admission: I have personally reviewed images CT CHEST ABDOMEN PELVIS W CONTRAST Result Date: 06/18/2024 EXAM: CT CHEST, ABDOMEN AND PELVIS WITH CONTRAST 06/18/2024 05:30:52 AM TECHNIQUE: CT of the chest, abdomen and pelvis was performed with the administration of 100 mL of iohexol  (OMNIPAQUE ) 350 MG/ML injection. Multiplanar reformatted images are provided for review. Automated exposure control, iterative reconstruction, and/or weight based adjustment of the mA/kV was utilized to reduce the radiation dose to as low as reasonably achievable. COMPARISON: CT angiogram of the chest dated 06/01/2023 and CT of the abdomen and pelvis dated 12/08/2022. CLINICAL HISTORY: Sepsis; R abd tenderness, cough, sepsis. FINDINGS: CHEST:  MEDIASTINUM AND LYMPH NODES: Heart demonstrates moderate calcific coronary artery disease and moderate aortic valvular disease. The thoracic aorta demonstrates moderate calcific atheromatous disease. There are numerous shotty mediastinal lymph nodes. The patient is status post sternotomy for aortic valve repair. The central airways are clear. No hilar or axillary lymphadenopathy. LUNGS AND PLEURA: There are numerous patchy and hazy opacities scattered throughout both  lung zones suggestive of widespread infectious or inflammatory process. No pleural effusion or pneumothorax. ABDOMEN AND PELVIS: LIVER: The liver is unremarkable. GALLBLADDER AND BILE DUCTS: There are stones laying dependent within the gallbladder. No biliary ductal dilatation. SPLEEN: No acute abnormality. PANCREAS: No acute abnormality. ADRENAL GLANDS: No acute abnormality. KIDNEYS, URETERS AND BLADDER: There is a 6.5 cm left parapelvic renal cyst. Small simple cysts are also present within the right kidney. Per consensus, no follow-up is needed for simple Bosniak type 1 and 2 renal cysts, unless the patient has a malignancy history or risk factors. No stones in the kidneys or ureters. No hydronephrosis. No perinephric or periureteral stranding. Urinary bladder is unremarkable. GI AND BOWEL: Stomach demonstrates no acute abnormality. There is no bowel obstruction. REPRODUCTIVE ORGANS: No acute abnormality. PERITONEUM AND RETROPERITONEUM: No ascites. No free air. VASCULATURE: The abdominal aorta is normal in caliber and demonstrates moderate calcific atheromatous disease. ABDOMINAL AND PELVIS LYMPH NODES: No lymphadenopathy. BONES AND SOFT TISSUES: No acute osseous abnormality. No focal soft tissue abnormality. IMPRESSION: 1. Numerous patchy and hazy opacities throughout both lungs compatible with widespread infectious or inflammatory process. No pleural effusion or pneumothorax. Correlate clinically for infection. Recommend short-term radiographic or  CT follow-up as clinically indicated to document resolution. 2. Cholelithiasis without biliary ductal dilatation. 3. Left parapelvic renal cyst measuring 6.5 cm and additional small simple right renal cysts; no follow up indicated. 4. Atherosclerotic calcifications involving the coronary arteries, aortic valve, thoracic aorta, and abdominal aorta. Status post median sternotomy for aortic valve repair. Electronically signed by: Evalene Coho MD 06/18/2024 05:53 AM EST RP Workstation: [id number]   CT Cervical Spine Wo Contrast Result Date: 06/18/2024 EXAM: CT CERVICAL SPINE WITHOUT CONTRAST 06/18/2024 05:30:52 AM TECHNIQUE: CT of the cervical spine was performed without the administration of intravenous contrast. Multiplanar reformatted images are provided for review. Automated exposure control, iterative reconstruction, and/or weight based adjustment of the mA/kV was utilized to reduce the radiation dose to as low as reasonably achievable. COMPARISON: None available. CLINICAL HISTORY: Neck trauma (Age >= 65y) FINDINGS: CERVICAL SPINE: BONES AND ALIGNMENT: There is no evidence of fracture or acute traumatic injury. There is straightening of the normal cervical lordosis. DEGENERATIVE CHANGES: There is multilevel chronic degenerative disc disease and facet arthropathy. The left facets at C2-C3 and C4-C5 are fused. There is a posterior disc osteophyte complex present at C3-C4 causing mild-to-moderate central spinal canal stenosis and moderate left neural foraminal stenosis. There is moderate left facet arthrosis at C4-C5 secondary to facet hypertrophic changes. There is mild-to-moderate bilateral neural foraminal stenosis at C5-C6 and moderate bilateral neural foraminal stenosis and central spinal canal stenosis at C6-C7. SOFT TISSUES: No prevertebral soft tissue swelling. Calcifications within the carotid bulbs bilaterally. IMPRESSION: 1. No acute abnormality of the cervical spine related to the provided clinical  history of neck trauma. 2. Multilevel chronic degenerative changes of the cervical spine as detailed, including mild-to-moderate central canal stenosis at C3-4 and C6-7, and multilevel neural foraminal stenoses. 3. Carotid bulb atherosclerotic calcifications bilaterally; consider correlation with cardiovascular risk factors and management as clinically indicated. Electronically signed by: Evalene Coho MD 06/18/2024 05:46 AM EST RP Workstation: [id number]   CT Head Wo Contrast Result Date: 06/18/2024 EXAM: CT HEAD WITHOUT CONTRAST 06/18/2024 05:30:52 AM TECHNIQUE: CT of the head was performed without the administration of intravenous contrast. Automated exposure control, iterative reconstruction, and/or weight based adjustment of the mA/kV was utilized to reduce the radiation dose to as low as reasonably achievable. COMPARISON: 06/01/2023 CLINICAL HISTORY: Head  trauma, minor (Age >= 65y) FINDINGS: BRAIN AND VENTRICLES: No acute hemorrhage. Remote left parietal infarct. New age indeterminate right occipital lobe infarct. Moderate chronic microvascular ischemic change. No hydrocephalus. No extra-axial collection. No mass effect or midline shift. ORBITS: Right lens replacement. SINUSES: No acute abnormality. SOFT TISSUES AND SKULL: No acute soft tissue abnormality. No skull fracture. Partial left mastoid effusion. IMPRESSION: 1. No acute intracranial abnormality related to the head trauma. 2. New age indeterminate right occipital lobe infarct. 3. Remote left parietal infarct. 4. Moderate chronic microvascular ischemic change. Electronically signed by: Evalene Coho MD 06/18/2024 05:41 AM EST RP Workstation: [id number]   DG Chest Port 1 View Result Date: 06/18/2024 EXAM: 1 VIEW(S) XRAY OF THE CHEST 06/18/2024 04:01:00 AM COMPARISON: 12/09/2023 CLINICAL HISTORY: Questionable sepsis - evaluate for abnormality FINDINGS: LUNGS AND PLEURA: Patchy airspace infiltrates within the left perihilar and retrocardiac  regions, possibly infectious in nature. No pleural effusion. No pneumothorax. HEART AND MEDIASTINUM: CABG changes. Aortic atherosclerosis. No acute abnormality of the cardiac and mediastinal silhouettes. BONES AND SOFT TISSUES: Intact sternotomy wires. No acute osseous abnormality. IMPRESSION: 1. Patchy left perihilar and retrocardiac airspace infiltrates, suspicious for pneumonia. Electronically signed by: Dorethia Molt MD 06/18/2024 04:13 AM EST RP Workstation: [id number]    EKG: My personal interpretation of EKG shows: Sinus tachycardia at 108 bpm, no ST elevation    Assessment/Plan Principal Problem:   Severe sepsis (HCC) Active Problems:   Uncontrolled type 2 diabetes mellitus with hyperglycemia, with long-term current use of insulin  (HCC)   Paroxysmal atrial fibrillation/A-flutter (HCC)   Status post coronary artery bypass graft   Benign prostatic hyperplasia   Essential hypertension   OSA (obstructive sleep apnea)   S/P aortic valve replacement   Hyperlipidemia LDL goal <70   GERD (gastroesophageal reflux disease)   CAP (community acquired pneumonia)   Sepsis secondary to UTI (HCC)   Fall at home, initial encounter    Assessment and Plan: 78 year old male with multiple medical problems including but not limited to DM, HTN, HLD, A-fib not on anticoagulation, BPH, OSA on CPAP, CAD s/p aortic valve replacement, GERD who was brought into ED after he had a fall at home.  1.  Fall at home - He will be admitted to the hospital as inpatient. - CT scan of the abdomen, pelvis, C-spine, brain done. - CT head showed possible indeterminate ischemic stroke - Will get stroke workup including MRI of the brain, carotid ultrasound, lipid panel, PT/OT - If MRI brain is positive then we will call neurology for evaluation - Fall precaution  2.  Sepsis likely due to UTI and pneumonia - He was placed in sepsis protocol in the emergency room. - Continue IV fluid, received vancomycin , cefepime ,  azithromycin  and Flagyl  in the emergency room. - Will continue cefepime  and azithromycin  at this point to cover for pneumonia and UTI. - Follow the cultures.  3.  Community-acquired pneumonia - Chest x-ray and CT abdomen showed possible pneumonia. - Placed in focused pneumonia protocol - Continue cefepime  and azithromycin . - Continue to follow the cultures.  4.  Urinary tract infection - Started on cefepime  which will be continued. - Follow-up cultures  5.  Obstructive sleep apnea - He will be started on BiPAP and will continue to monitor his symptoms.  6.  MDS - Follow-up as outpatient  7.  CAD, s/p aortic valve replacement - Follow-up as outpatient - Continue ranolazine , metoprolol  and Lasix   8.  HTN/HLD/A-fib - Rate has been well-controlled - Blood pressure is stable -  Will continue to resume his home medications including metoprolol , rosuvastatin   9.  Diabetes - Patient takes insulin  70/30 100 units 2 times daily - Will give him Lantus  25 units at daily while he is in the hospital and insulin  sliding scale  10.  GERD - He takes omeprazole  at home. - Will give him Protonix  while he is in the hospital.  11.  He complained of diarrhea today. - Will send C. difficile colitis.     DVT prophylaxis: Lovenox  Code Status: Full Code Family Communication: None Disposition Plan: Home versus rehab Consults called: None Admission status: Inpatient, progressive   Nena Rebel, MD [facility] 06/18/2024, 11:56 AM

## 2024-06-18 NOTE — Progress Notes (Signed)
 SLP Cancellation Note  Patient Details Name: Jonathan Cross. MRN: [id number] DOB: 12/27/1945   Cancelled treatment:       Reason Eval/Treat Not Completed: SLP screened, no needs identified, will sign off  Pt seen for cognitive linguistic evaluation in the setting of fall. CT head revealing,  No acute intracranial abnormality related to the head trauma. New age indeterminate right occipital lobe infarct. Remote left parietal infarct. Moderate chronic microvascular ischemic change. Pt alert, oriented to self/situation, with intact sustained attention for duration of conversation. Pt denied acute cognitive concerns, reports feeling like two flat tires and reporting desire for rest and recovery. Recommendations shared for pursuing SLP evaluation at next level of care if clinically indicated/desired. No further acute SLP services indicated.   Lailee Hoelzel Clapp, MS, CCC-SLP Speech Language Pathologist Rehab Services; [facility] [phone] (ascom)   Carley Glendenning J Clapp 06/18/2024, 4:50 PM

## 2024-06-18 NOTE — Progress Notes (Signed)
 PHARMACIST - PHYSICIAN COMMUNICATION  CONCERNING:  Enoxaparin  (Lovenox ) for DVT Prophylaxis    RECOMMENDATION: Patient was prescribed enoxaprin 40mg  q24 hours for VTE prophylaxis.   Filed Weights   06/18/24 0511  Weight: 117.9 kg (260 lb)    Body mass index is 35.26 kg/m.  Estimated Creatinine Clearance: 70.3 mL/min (by C-G formula based on SCr of 1.11 mg/dL).  Noted Hgb 9.3 (low but stable), no s/sx of bleeding reported.  Based on [facility] policy patient is candidate for enoxaparin  0.5mg /kg TBW SQ every 24 hours based on BMI being >30.  DESCRIPTION: Pharmacy has adjusted enoxaparin  dose per [facility] policy.  Patient is now receiving enoxaparin  60 mg every 24 hours    Saniyya Gau Rodriguez-Guzman PharmD, BCPS 06/18/2024 12:04 PM

## 2024-06-18 NOTE — Progress Notes (Signed)
 ED Pharmacy Antibiotic Sign Off An antibiotic consult was received from an ED provider for Vancomycin  per pharmacy dosing for sepsis. A chart review was completed to assess appropriateness.   The following one time order(s) were placed:  Vancomycin  2500 mg IV X 1  Further antibiotic and/or antibiotic pharmacy consults should be ordered by the admitting provider if indicated.   Thank you for allowing pharmacy to be a part of this patient's care.   Silver Selinda BIRCH [facility]  Clinical Pharmacist 06/18/24 5:40 AM

## 2024-06-19 DIAGNOSIS — A419 Sepsis, unspecified organism: Secondary | ICD-10-CM | POA: Diagnosis not present

## 2024-06-19 DIAGNOSIS — R6520 Severe sepsis without septic shock: Secondary | ICD-10-CM | POA: Diagnosis not present

## 2024-06-19 LAB — BLOOD CULTURE ID PANEL (REFLEXED) - BCID2

## 2024-06-19 LAB — MAGNESIUM: Magnesium: 1.8 mg/dL (ref 1.7–2.4)

## 2024-06-19 LAB — BASIC METABOLIC PANEL WITH GFR
Anion gap: 11 (ref 5–15)
BUN: 16 mg/dL (ref 8–23)
CO2: 22 mmol/L (ref 22–32)
Calcium: 8.2 mg/dL — ABNORMAL LOW (ref 8.9–10.3)
Chloride: 101 mmol/L (ref 98–111)
Creatinine, Ser: 1.09 mg/dL (ref 0.61–1.24)
GFR, Estimated: >60 mL/min (ref >60)
Glucose, Bld: 210 mg/dL — ABNORMAL HIGH (ref 70–99)
Potassium: 3.5 mmol/L (ref 3.5–5.1)
Sodium: 134 mmol/L — ABNORMAL LOW (ref 135–145)

## 2024-06-19 LAB — GASTROINTESTINAL PANEL BY PCR, STOOL (REPLACES STOOL CULTURE)

## 2024-06-19 LAB — GLUCOSE, CAPILLARY
Glucose-Capillary: 142 mg/dL — ABNORMAL HIGH (ref 70–99)
Glucose-Capillary: 199 mg/dL — ABNORMAL HIGH (ref 70–99)
Glucose-Capillary: 184 mg/dL — ABNORMAL HIGH (ref 70–99)
Glucose-Capillary: 252 mg/dL — ABNORMAL HIGH (ref 70–99)

## 2024-06-19 LAB — CBC
HCT: 29.8 % — ABNORMAL LOW (ref 39.0–52.0)
Hemoglobin: 9.3 g/dL — ABNORMAL LOW (ref 13.0–17.0)
MCH: 35.9 pg — ABNORMAL HIGH (ref 26.0–34.0)
MCHC: 31.2 g/dL (ref 30.0–36.0)
MCV: 115.1 fL — ABNORMAL HIGH (ref 80.0–100.0)
Platelets: 131 K/uL — ABNORMAL LOW (ref 150–400)
RBC: 2.59 MIL/uL — ABNORMAL LOW (ref 4.22–5.81)
RDW: 15.9 % — ABNORMAL HIGH (ref 11.5–15.5)
WBC: 9.6 K/uL (ref 4.0–10.5)
nRBC: 0 % (ref 0.0–0.2)

## 2024-06-19 LAB — LIPID PANEL
Cholesterol: 135 mg/dL (ref 0–200)
HDL: 17 mg/dL — ABNORMAL LOW (ref >40)
LDL Cholesterol: 81 mg/dL (ref 0–99)
Total CHOL/HDL Ratio: 8.1 ratio
Triglycerides: 187 mg/dL — ABNORMAL HIGH (ref <150)
VLDL: 37 mg/dL (ref 0–40)

## 2024-06-19 LAB — C DIFFICILE QUICK SCREEN W PCR REFLEX
C Diff antigen: NEGATIVE
C Diff interpretation: NOT DETECTED
C Diff toxin: NEGATIVE

## 2024-06-19 MED ORDER — IPRATROPIUM-ALBUTEROL 0.5-2.5 (3) MG/3ML IN SOLN: 3.0000 mL | RESPIRATORY_TRACT | Status: DC | PRN

## 2024-06-19 MED ORDER — HYDROCOD POLI-CHLORPHE POLI ER 10-8 MG/5ML PO SUER
5.0000 mL | Freq: Two times a day (BID) | ORAL | Status: DC | PRN
  Administered 2024-06-19 – 2024-06-22 (×4): 5 mL via ORAL
  Filled 2024-06-19 (×4): qty 5

## 2024-06-19 MED ORDER — CEFAZOLIN SODIUM-DEXTROSE 2-4 GM/100ML-% IV SOLN
2.0000 g | Freq: Three times a day (TID) | INTRAVENOUS | Status: DC
  Administered 2024-06-19 – 2024-06-20 (×4): 2 g via INTRAVENOUS
  Filled 2024-06-19 (×5): qty 100

## 2024-06-19 MED ORDER — BENZONATATE 100 MG PO CAPS
100.0000 mg | ORAL_CAPSULE | Freq: Three times a day (TID) | ORAL | Status: DC | PRN
  Administered 2024-06-19: 100 mg via ORAL
  Filled 2024-06-19: qty 1

## 2024-06-19 MED ORDER — CEFAZOLIN SODIUM-DEXTROSE 2-4 GM/100ML-% IV SOLN
2.0000 g | Freq: Three times a day (TID) | INTRAVENOUS | Status: AC
  Administered 2024-06-19: 2 g via INTRAVENOUS
  Filled 2024-06-19: qty 100

## 2024-06-19 MED ORDER — LOPERAMIDE HCL 2 MG PO CAPS
2.0000 mg | ORAL_CAPSULE | Freq: Four times a day (QID) | ORAL | Status: DC | PRN
  Administered 2024-06-19 (×2): 2 mg via ORAL
  Filled 2024-06-19 (×2): qty 1

## 2024-06-19 NOTE — Evaluation (Signed)
 Physical Therapy Evaluation Patient Details Name: Jonathan Cross. MRN: [id number] DOB: 1946/02/14 Today's Date: 06/19/2024  History of Present Illness  Pt is an 78 year old male presented to the ED after a fall at home, admitted with UTI, sepsis, CAP, c-diff, and stroke work up. Per MRI impression: no acute intracranial abnormality, remote left parietal and right occipital infarcts. PMH significant for diabetes, HTN, GERD, HLD, 2 of bacterial endocarditis, CAD s/p aortic valve replacement, A-fib not on anticoagulation, recurrent skin abscesses, history of MRSA.   Clinical Impression  Pt was pleasant and motivated to participate during the session and put forth good effort throughout. Pt found to be resting in a large pool of liquid stool.  Pt participated in bed mobility tasks per below for hygiene and linen change with cues for sequencing and use of bed rails for assist.  Pt required extra time and effort with multiple rolls left and right but no physical assist. During rolling assessment pt found to be having continued liquid bowl movements with further functional mobility assessment deferred.  MD and nursing notified of pt's continuous loose stools along with pt complaint of burning to the skin on his bottom.  Pt reported no other adverse symptoms during the session with SpO2 and HR WNL throughout on room air.  Pt will benefit from continued PT services upon discharge to safely address deficits listed in patient problem list for decreased caregiver assistance and eventual return to PLOF.           If plan is discharge home, recommend the following: A lot of help with walking and/or transfers;A little help with bathing/dressing/bathroom;Assistance with cooking/housework;Help with stairs or ramp for entrance;Assist for transportation   Can travel by private vehicle   No    Equipment Recommendations Other (comment) (TBD at next venue of care)  Recommendations for Other Services        Functional Status Assessment Patient has had a recent decline in their functional status and demonstrates the ability to make significant improvements in function in a reasonable and predictable amount of time.     Precautions / Restrictions Precautions Precautions: Fall Recall of Precautions/Restrictions: Impaired Restrictions Weight Bearing Restrictions Per Provider Order: No Other Position/Activity Restrictions: (+) c-diff      Mobility  Bed Mobility Overal bed mobility: Needs Assistance Bed Mobility: Rolling Rolling: Supervision         General bed mobility comments: Multi-modal cuing for sequencing with use of bed rails and min extra time/effort but no physical assist required    Transfers                   General transfer comment: deferred secondary to active diarrhea during the session    Ambulation/Gait                  Stairs            Wheelchair Mobility     Tilt Bed    Modified Rankin (Stroke Patients Only)       Balance                                             Pertinent Vitals/Pain Pain Assessment Pain Assessment: No/denies pain    Home Living Family/patient expects to be discharged to:: Private residence Living Arrangements: Children (daughter and grandson) Available Help at Discharge: Family;Available PRN/intermittently Type  of Home: House Home Access: Ramped entrance       Home Layout: One level Home Equipment: Grab bars - tub/shower;Rolling Cross (2 wheels);Shower seat;Cane - single point;Rollator (4 wheels) Additional Comments: has home 02 that he does not wear per chart review    Prior Function Prior Level of Function : Needs assist;History of Falls (last six months)             Mobility Comments: Mod Ind amb with a rollator limited community distances with occasional household amb without an AD with furniture cruising, multiple fall history ADLs Comments: Pt reports MOD I-I in  ADL, assist for IADL but he does drive/work (picks up car parts)     Extremity/Trunk Assessment   Upper Extremity Assessment Upper Extremity Assessment: Defer to OT evaluation    Lower Extremity Assessment Lower Extremity Assessment: Generalized weakness       Communication   Communication Communication: Impaired Factors Affecting Communication: Hearing impaired    Cognition Arousal: Alert Behavior During Therapy: WFL for tasks assessed/performed   PT - Cognitive impairments: No apparent impairments                         Following commands: Impaired Following commands impaired: Follows one step commands with increased time     Cueing Cueing Techniques: Verbal cues, Tactile cues, Visual cues     General Comments      Exercises     Assessment/Plan    PT Assessment Patient needs continued PT services  PT Problem List Decreased strength;Decreased activity tolerance;Decreased mobility       PT Treatment Interventions DME instruction;Gait training;Functional mobility training;Therapeutic activities;Therapeutic exercise;Balance training;Patient/family education    PT Goals (Current goals can be found in the Care Plan section)  Acute Rehab PT Goals Patient Stated Goal: To feel better PT Goal Formulation: With patient Time For Goal Achievement: 07/02/24 Potential to Achieve Goals: Good    Frequency Min 2X/week     Co-evaluation               AM-PAC PT 6 Clicks Mobility  Outcome Measure Help needed turning from your back to your side while in a flat bed without using bedrails?: A Little Help needed moving from lying on your back to sitting on the side of a flat bed without using bedrails?: A Little Help needed moving to and from a bed to a chair (including a wheelchair)?: A Lot Help needed standing up from a chair using your arms (e.g., wheelchair or bedside chair)?: A Lot Help needed to walk in hospital room?: A Lot Help needed climbing  3-5 steps with a railing? : A Lot 6 Click Score: 14    End of Session   Activity Tolerance: Other (comment) (limited by active diarrhea during the session) Patient left: in bed;with nursing/sitter in room;Other (comment) (pt left with CNA assisting pt with linen change) Nurse Communication: Mobility status PT Visit Diagnosis: Muscle weakness (generalized) (M62.81);Difficulty in walking, not elsewhere classified (R26.2)    Time: 8997-8968 PT Time Calculation (min) (ACUTE ONLY): 29 min   Charges:   PT Evaluation $PT Eval Moderate Complexity: 1 Mod   PT General Charges $$ ACUTE PT VISIT: 1 Visit       D. Scott Kimani Hovis PT, DPT 06/19/24, 11:02 AM

## 2024-06-19 NOTE — Progress Notes (Signed)
 PHARMACY - PHYSICIAN COMMUNICATION CRITICAL VALUE ALERT - BLOOD CULTURE IDENTIFICATION (BCID)  Jonathan Cross. is an 78 y.o. male who presented to [facility] on 06/18/2024 with a chief complaint of sepsis.   Assessment:  Staph epi in 3 of 4 bottles , no resitance detected.  (include suspected source if known)  Name of physician (or Provider) Contacted:  Jonathan Cross   Current antibiotics:  Cefepime ,  Azithromycin    Changes to prescribed antibiotics recommended:  - will d/c currently abx and start Cefazolin  2 gm IV Q8H   Results for orders placed or performed during the hospital encounter of 06/18/24  Blood Culture ID Panel (Reflexed) (Collected: 06/18/2024  3:58 AM)  Result Value Ref Range   Enterococcus faecalis NOT DETECTED NOT DETECTED   Enterococcus Faecium NOT DETECTED NOT DETECTED   Listeria monocytogenes NOT DETECTED NOT DETECTED   Staphylococcus species DETECTED (A) NOT DETECTED   Staphylococcus aureus (BCID) NOT DETECTED NOT DETECTED   Staphylococcus epidermidis DETECTED (A) NOT DETECTED   Staphylococcus lugdunensis NOT DETECTED NOT DETECTED   Streptococcus species NOT DETECTED NOT DETECTED   Streptococcus agalactiae NOT DETECTED NOT DETECTED   Streptococcus pneumoniae NOT DETECTED NOT DETECTED   Streptococcus pyogenes NOT DETECTED NOT DETECTED   A.calcoaceticus-baumannii NOT DETECTED NOT DETECTED   Bacteroides fragilis NOT DETECTED NOT DETECTED   Enterobacterales NOT DETECTED NOT DETECTED   Enterobacter cloacae complex NOT DETECTED NOT DETECTED   Escherichia coli NOT DETECTED NOT DETECTED   Klebsiella aerogenes NOT DETECTED NOT DETECTED   Klebsiella oxytoca NOT DETECTED NOT DETECTED   Klebsiella pneumoniae NOT DETECTED NOT DETECTED   Proteus species NOT DETECTED NOT DETECTED   Salmonella species NOT DETECTED NOT DETECTED   Serratia marcescens NOT DETECTED NOT DETECTED   Haemophilus influenzae NOT DETECTED NOT DETECTED   Neisseria meningitidis NOT DETECTED NOT  DETECTED   Pseudomonas aeruginosa NOT DETECTED NOT DETECTED   Stenotrophomonas maltophilia NOT DETECTED NOT DETECTED   Candida albicans NOT DETECTED NOT DETECTED   Candida auris NOT DETECTED NOT DETECTED   Candida glabrata NOT DETECTED NOT DETECTED   Candida krusei NOT DETECTED NOT DETECTED   Candida parapsilosis NOT DETECTED NOT DETECTED   Candida tropicalis NOT DETECTED NOT DETECTED   Cryptococcus neoformans/gattii NOT DETECTED NOT DETECTED   Methicillin resistance mecA/C NOT DETECTED NOT DETECTED    Yug Loria D 06/19/2024  5:11 AM

## 2024-06-19 NOTE — Progress Notes (Signed)
 Reviewed chart and spoke with patient. Pt's family at bedside. I agree with previous assessment. Pt is in NAD.

## 2024-06-19 NOTE — Progress Notes (Addendum)
 PROGRESS NOTE    Jonathan Cross.  FMW:[id number] DOB: 1945/11/12 DOA: 06/18/2024 PCP: Roxanna Rocks, PA    Brief Narrative:  The patient is an 78 year old male with PMHx of CAD s/p CABG x 2 (01/2016), HFpEF, severe aortic stenosis s/p bioprosthetic aortic valve replacement, bacterial endocarditis, paroxysmal A-fib, MDS, HTN, HLD, T2DM, OSA, BPH, remote CVA x 2, who presented to the ED on 06/18/2024 after falling at home with head trauma but no LOC and being unable to get up.  Patient reports generalized weakness with cough, shortness of breath, and difficulty urinating over the past few days. He sustained a fall while ambulating to the bathroom due to his weakness, and was unable to get up.  He was down for 15 minutes before arrival of his daughter who called EMS.  EMS reported BP 123/60, axillary temp of 102.3 F, SpO2 91% on RA.  In the ED, he was found to be afebrile with a temp of 98.7 F, RR 22, HR 92, BP 97/50, SpO2 96% on 2 L nasal cannula.  CBC showed WBC 16.3, Hgb 9.3, PLT 140.  CMP was remarkable for bicarb of 20.  Lactic acid was elevated to 2.1.  PT/INR elevated to 17/1.3.  4 Plex PCR negative.  UA showed amber-colored cloudy urine, >300 protein, moderate leukocytes, >50 WBC, many bacteria.  High-sensitivity troponin elevated to 73> 75.  Blood cultures and urine cultures were obtained.  CXR showed patchy left perihilar and retrocardiac airspace infiltrates suspicious for pneumonia CT head shows no acute traumatic abnormality, and new age-indeterminate right occipital infarct, remote left parietal infarct, and moderate chronic microvascular ischemic changes  CT C-spine showed no acute traumatic abnormality, multilevel chronic degenerative changes with mild to moderate central canal stenosis at C3-C4 and C6-C7 post multilevel foraminal stenoses, and bilateral carotid bulb atherosclerotic calcifications CT chest abdomen pelvis showed diffuse bilateral patchy/hazy pulmonary opacities  consistent with infectious/inflammatory process, cholelithiasis without biliary dilation, benign bilateral renal cysts, and extensive atherosclerotic calcifications with prior aortic valve repair  Patient was treated with a 2 L LR bolus, Flagyl , cefepime , vancomycin ,   Assessment and Plan:  Sepsis - Met sepsis criteria on admission with tachycardia to 104, tachypnea to 22, febrile to 102.3 F per EMS (98.7 F on arrival after receiving acetaminophen  by EMS), leukocytosis to 16.3.  Lactic acid elevated 2.1.  Pneumonia and UTI likely source of infection, later found to have staph epi bacteremia - Sepsis protocol initiated in the ED - Management of infectious processes as below  Staph epidermidis bacteremia Concern for endocarditis given presence of bioprosthetic aortic valve - BCx (12/6) positive for Staph epidermidis - TTE (12/7) without evidence of vegetation - Antibiotics revised Ancef  2 g every 8 hours - Discussed with Dr. Fleeta Rothman, recommended asking lab to run susceptibilities on a second bottle.  Discussed with lab. - ID consulted - Will likely need TEE given bioprosthetic aortic valve - Repeat blood cultures ordered for 12/8 AM  Acute hypoxic respiratory failure - resolved - Presented with several days of shortness of breath and cough for several days, with SpO2 88% when trialed on RA in the ED, with improvement to 95% on 2 L Simpson. - Likely secondary to CAP - Weaned down to RA today, maintained SpO2 92-100%  - PRN DuoNebs q4h  CAP - Presenting with cough and shortness of breath, hypoxic to 88% on RA - Procalcitonin 0.41 - CT chest with diffuse bilateral patchy/hazy pulmonary opacities consistent with infectious/inflammatory process - On Ancef  2g q8h  UTI - UA consistent with UTI - Urine cultures pending - On Ancef  2g q8h  Ruled out acute stroke - CT head showed new age-indeterminate right occipital infarct with remote left parietal infarct - MRI brain was ordered to  delineate chronicity, noted no intracranial abnormalities, remote left parietal and right occipital infarcts  Fall Generalized Weakness - Progressive generalized weakness leading to fall and inability to get up, likely secondary to acute illness given above mentioned infectious etiologies  - PT/OT evaluated patient and recommended SNF placement - TOC consulted - Fall precautions  CAD s/p CABG x 2  - LHC (06/2022) showed patent LIMA to LAD and vein graft to second obtuse marginal, otherwise moderate, diffuse CAD - Continue Toprol -XL 12.5 mg twice daily, Ranexa  500 mg twice daily, Crestor  40 mg daily  Chronic HFpEF - TTE (08/2023) showed LVEF 65-70%, mild LVH, mildly reduced RV systolic function, trivial MR - TTE (06/19/2024) showed LVEF 60-65%, no RWMA, no LVH, G2 DD. - Continue Toprol -XL and p.o. Lasix  40 mg twice daily  Paroxysmal A-fib - Continue Toprol -XL 25 mg daily - Not on anticoagulation given history of falls, prior rectal bleeding, and anemia in the setting of mild dysplastic syndrome  Diarrhea - Patient reports multiple episodes of loose stools both at home and while currently hospitalized - GIPP and C. Diff PCR negative - Manage conservatively - PRN imodium   T2DM - Hemoglobin A1c pending - Continue Semglee  25 units daily, NovoLog  3 units TID AC, SSI 0-9 units AC, SSI 0-5 units HS - Hypoglycemia protocol  Neuropathy -Continue home gabapentin  100 mg 3 times daily  BPH - Continue Flomax  0.4 mg daily  MDS - Follows with heme/onc - Hgb stable   Class II Obesity (BMI Body mass index is 35.26 kg/m. kg/m) - Obesity is clinically significant and contributes to T2DM, HTN, HLD, HFpEF, CAD, AFib, and OSA.  - Recommend continued discussion with PCP re: lifestyle modification including dietary changes, regular physical activity, and weight reduction strategies.    DVT prophylaxis: Lovenox    Code Status:   Code Status: Prior  Family Communication: Spoke with  patient's daughter on the phone  Disposition Plan: Likely SNF pending clinical improvement PT - Follow Up Recommendations: Skilled nursing-short term rehab (<3 hours/day) OT - Follow Up Recommendations: Skilled nursing-short term rehab (<3 hours/day)  DME Needs: PT equipment: Other (comment) (TBD at next venue of care)     Level of care: Progressive  Consultants:  None  Procedures:  None  Antimicrobials: Ancef     Subjective: Examined at bedside. Reports feeling a little better than before.  Continues to have cough and some diarrhea. Shortness of breath is somewhat improved, but he's been weaned down to room air.   Objective: Vitals:   06/19/24 0049 06/19/24 0503 06/19/24 0810 06/19/24 0937  BP: 121/67 (!) 144/75 (!) 127/59 117/60  Pulse: 81 82 86 95  Resp:   16   Temp: 98.4 F (36.9 C) 98.4 F (36.9 C) 98.1 F (36.7 C)   TempSrc: Oral Oral    SpO2: 96% 100% 93% 92%  Weight:      Height:        Intake/Output Summary (Last 24 hours) at 06/19/2024 0951 Last data filed at 06/19/2024 0604 Gross per 24 hour  Intake 220 ml  Output 1800 ml  Net -1580 ml   Filed Weights   06/18/24 0511  Weight: 117.9 kg    Examination:  Gen: NAD, A&Ox3 HEENT: NCAT, EOMI Neck: Supple, no JVD CV: RRR, no  murmurs Resp: normal WOB, breath sounds diminished bilaterally, mild diffuse bilateral expiratory wheezing Abd: Soft, NTND, no guarding Ext: No LE edema, pulses 2+ b/l Skin: Warm, dry Neuro: CN II-XII grossly intact, no focal deficit noted Psych: Calm, cooperative, appropriate affect   Data Reviewed: I have personally reviewed following labs and imaging studies  CBC: Recent Labs  Lab 06/18/24 0358 06/19/24 0613  WBC 16.3* 9.6  NEUTROABS 13.5*  --   HGB 9.3* 9.3*  HCT 28.9* 29.8*  MCV 113.8* 115.1*  PLT 140* 131*   Basic Metabolic Panel: Recent Labs  Lab 06/18/24 0358 06/18/24 1229 06/19/24 0613  NA 136 135  --   K 3.7 3.8  --   CL 104 102  --   CO2 20* 19*   --   GLUCOSE 147* 190*  --   BUN 14 13  --   CREATININE 1.11 1.02  --   CALCIUM  8.6* 8.5*  --   MG  --   --  1.8   GFR: Estimated Creatinine Clearance: 76.6 mL/min (by C-G formula based on SCr of 1.02 mg/dL). Liver Function Tests: Recent Labs  Lab 06/18/24 0358 06/18/24 1229  AST 20 20  ALT 14 12  ALKPHOS 59 56  BILITOT 0.9 0.8  PROT 7.7 7.4  ALBUMIN 3.6 3.4*   No results for input(s): LIPASE, AMYLASE in the last 168 hours. No results for input(s): AMMONIA in the last 168 hours. Coagulation Profile: Recent Labs  Lab 06/18/24 0358  INR 1.3*   Cardiac Enzymes: No results for input(s): CKTOTAL, CKMB, CKMBINDEX, TROPONINI in the last 168 hours. BNP (last 3 results) No results for input(s): PROBNP in the last 8760 hours. HbA1C: No results for input(s): HGBA1C in the last 72 hours. CBG: Recent Labs  Lab 06/18/24 1447 06/18/24 1623 06/18/24 1747 06/18/24 2006 06/19/24 0804  GLUCAP 167* 213* 206* 146* 142*   Lipid Profile: Recent Labs    06/19/24 0613  CHOL 135  HDL 17*  LDLCALC 81  TRIG 812*  CHOLHDL 8.1   Thyroid Function Tests: No results for input(s): TSH, T4TOTAL, FREET4, T3FREE, THYROIDAB in the last 72 hours. Anemia Panel: No results for input(s): VITAMINB12, FOLATE, FERRITIN, TIBC, IRON , RETICCTPCT in the last 72 hours. Sepsis Labs: Recent Labs  Lab 06/18/24 0358 06/18/24 0604 06/18/24 1229  PROCALCITON  --   --  0.41  LATICACIDVEN 2.1* 1.7  --     Recent Results (from the past 240 hours)  Blood Culture (routine x 2)     Status: None (Preliminary result)   Collection Time: 06/18/24  3:58 AM   Specimen: BLOOD  Result Value Ref Range Status   Specimen Description BLOOD BLOOD LEFT ARM  Final   Special Requests   Final    BOTTLES DRAWN AEROBIC AND ANAEROBIC Blood Culture adequate volume   Culture  Setup Time   Final    GRAM POSITIVE COCCI AEROBIC BOTTLE ONLY CRITICAL VALUE NOTED.  VALUE IS CONSISTENT  WITH PREVIOUSLY REPORTED AND CALLED VALUE. Performed at [facility], [address] [place], [place] [address]    Culture [facility] POSITIVE COCCI  Final   Report Status PENDING  Incomplete  Blood Culture (routine x 2)     Status: None (Preliminary result)   Collection Time: 06/18/24  3:58 AM   Specimen: BLOOD  Result Value Ref Range Status   Specimen Description   Final    BLOOD BLOOD RIGHT ARM Performed at [facility], [address], [place], [place]  [address]    Special Requests   Final    BOTTLES DRAWN AEROBIC AND ANAEROBIC Blood Culture adequate volume Performed at [facility], [address] [place], [place] [address]    Culture  Setup Time   Final    GRAM POSITIVE COCCI IN BOTH AEROBIC AND ANAEROBIC BOTTLES CRITICAL RESULT CALLED TO, READ BACK BY AND VERIFIED WITH: SELINDA SIMPERS, PHARMD @0411  06/19/2024 COP Performed at [facility], [address] [place], [place] [address]    Culture GRAM POSITIVE COCCI  Final   Report Status PENDING  Incomplete  Resp panel by RT-PCR (RSV, Flu A&B, Covid) Urine, Clean Catch     Status: None   Collection Time: 06/18/24  3:58 AM   Specimen: Urine, Clean Catch; Nasal Swab  Result Value Ref Range Status   SARS Coronavirus 2 by RT PCR NEGATIVE NEGATIVE Final    Comment: (NOTE) SARS-CoV-2 target nucleic acids are NOT DETECTED.  The SARS-CoV-2 RNA is generally detectable in upper respiratory specimens during the acute phase of infection. The lowest concentration of SARS-CoV-2 viral copies this assay can detect is 138 copies/mL. A negative result does not preclude SARS-Cov-2 infection and should not be used as the sole basis for treatment or other patient management decisions. A negative result may occur with  improper specimen collection/handling, submission of specimen other than nasopharyngeal swab, presence of viral mutation(s) within the areas targeted by this assay, and inadequate number of  viral copies(<138 copies/mL). A negative result must be combined with clinical observations, patient history, and epidemiological information. The expected result is Negative.  Fact Sheet for Patients:  bloggercourse.com  Fact Sheet for Healthcare Providers:  seriousbroker.it  This test is no t yet approved or cleared by the [place]  FDA and  has been authorized for detection and/or diagnosis of SARS-CoV-2 by FDA under an Emergency Use Authorization (EUA). This EUA will remain  in effect (meaning this test can be used) for the duration of the COVID-19 declaration under Section 564(b)(1) of the Act, 21 U.S.C.section 360bbb-3(b)(1), unless the authorization is terminated  or revoked sooner.       Influenza A by PCR NEGATIVE NEGATIVE Final   Influenza B by PCR NEGATIVE NEGATIVE Final    Comment: (NOTE) The Xpert Xpress SARS-CoV-2/FLU/RSV plus assay is intended as an aid in the diagnosis of influenza from Nasopharyngeal swab specimens and should not be used as a sole basis for treatment. Nasal washings and aspirates are unacceptable for Xpert Xpress SARS-CoV-2/FLU/RSV testing.  Fact Sheet for Patients: bloggercourse.com  Fact Sheet for Healthcare Providers: seriousbroker.it  This test is not yet approved or cleared by the [place]  FDA and has been authorized for detection and/or diagnosis of SARS-CoV-2 by FDA under an Emergency Use Authorization (EUA). This EUA will remain in effect (meaning this test can be used) for the duration of the COVID-19 declaration under Section 564(b)(1) of the Act, 21 U.S.C. section 360bbb-3(b)(1), unless the authorization is terminated or revoked.     Resp Syncytial Virus by PCR NEGATIVE NEGATIVE Final    Comment: (NOTE) Fact Sheet for Patients: bloggercourse.com  Fact Sheet for Healthcare  Providers: seriousbroker.it  This test is not yet approved or cleared by the [place]  FDA and has been authorized for detection and/or diagnosis of SARS-CoV-2 by FDA under an Emergency Use Authorization (EUA). This EUA will remain in effect (meaning this test can be used) for the duration of the COVID-19 declaration under Section 564(b)(1) of the Act, 21 U.S.C. section  360bbb-3(b)(1), unless the authorization is terminated or revoked.  Performed at [facility], [address] [place], [place] [address]   Blood Culture ID Panel (Reflexed)     Status: Abnormal   Collection Time: 06/18/24  3:58 AM  Result Value Ref Range Status   Enterococcus faecalis NOT DETECTED NOT DETECTED Final   Enterococcus Faecium NOT DETECTED NOT DETECTED Final   Listeria monocytogenes NOT DETECTED NOT DETECTED Final   Staphylococcus species DETECTED (A) NOT DETECTED Final    Comment: CRITICAL RESULT CALLED TO, READ BACK BY AND VERIFIED WITH: JASON ROBBINS, PHARMD @0411  06/19/2024 COP    Staphylococcus aureus (BCID) NOT DETECTED NOT DETECTED Final   Staphylococcus epidermidis DETECTED (A) NOT DETECTED Final    Comment: CRITICAL RESULT CALLED TO, READ BACK BY AND VERIFIED WITH: JASON ROBBINS, PHARMD @0411  06/19/2024 COP    Staphylococcus lugdunensis NOT DETECTED NOT DETECTED Final   Streptococcus species NOT DETECTED NOT DETECTED Final   Streptococcus agalactiae NOT DETECTED NOT DETECTED Final   Streptococcus pneumoniae NOT DETECTED NOT DETECTED Final   Streptococcus pyogenes NOT DETECTED NOT DETECTED Final   A.calcoaceticus-baumannii NOT DETECTED NOT DETECTED Final   Bacteroides fragilis NOT DETECTED NOT DETECTED Final   Enterobacterales NOT DETECTED NOT DETECTED Final   Enterobacter cloacae complex NOT DETECTED NOT DETECTED Final   Escherichia coli NOT DETECTED NOT DETECTED Final   Klebsiella aerogenes NOT DETECTED NOT DETECTED Final   Klebsiella oxytoca NOT  DETECTED NOT DETECTED Final   Klebsiella pneumoniae NOT DETECTED NOT DETECTED Final   Proteus species NOT DETECTED NOT DETECTED Final   Salmonella species NOT DETECTED NOT DETECTED Final   Serratia marcescens NOT DETECTED NOT DETECTED Final   Haemophilus influenzae NOT DETECTED NOT DETECTED Final   Neisseria meningitidis NOT DETECTED NOT DETECTED Final   Pseudomonas aeruginosa NOT DETECTED NOT DETECTED Final   Stenotrophomonas maltophilia NOT DETECTED NOT DETECTED Final   Candida albicans NOT DETECTED NOT DETECTED Final   Candida auris NOT DETECTED NOT DETECTED Final   Candida glabrata NOT DETECTED NOT DETECTED Final   Candida krusei NOT DETECTED NOT DETECTED Final   Candida parapsilosis NOT DETECTED NOT DETECTED Final   Candida tropicalis NOT DETECTED NOT DETECTED Final   Cryptococcus neoformans/gattii NOT DETECTED NOT DETECTED Final   Methicillin resistance mecA/C NOT DETECTED NOT DETECTED Final    Comment: Performed at [facility], [address] [place], [place] [address]     Radiology Studies: US  Carotid Bilateral (at [facility] and [facility] only) Result Date: 06/19/2024 CLINICAL DATA:  Transient ischemic attack EXAM: BILATERAL CAROTID DUPLEX ULTRASOUND TECHNIQUE: Elnor scale imaging, color Doppler and duplex ultrasound were performed of bilateral carotid and vertebral arteries in the neck. COMPARISON:  None Available. FINDINGS: Criteria: Quantification of carotid stenosis is based on velocity parameters that correlate the residual internal carotid diameter with NASCET-based stenosis levels, using the diameter of the distal internal carotid lumen as the denominator for stenosis measurement. The following velocity measurements were obtained: RIGHT ICA: 102 cm/sec CCA: 114 cm/sec SYSTOLIC ICA/CCA RATIO:  1.1 ECA: 130 cm/sec LEFT ICA: 98 cm/sec CCA: 104 cm/sec SYSTOLIC ICA/CCA RATIO:  1.1 ECA: 139 cm/sec RIGHT CAROTID ARTERY: Mild diffuse atherosclerotic changes. There is a single focus of  mild velocity elevation in the distal ICA without clear stenotic lesion at that site. RIGHT VERTEBRAL ARTERY:  Antegrade. LEFT CAROTID ARTERY:  Mild diffuse atherosclerotic changes. LEFT VERTEBRAL ARTERY:  Antegrade. IMPRESSION: 1. Right carotid system: Mild atherosclerotic changes with estimated stenosis measuring less than 50%.  2. Left carotid system: Mild atherosclerotic changes with estimated stenosis measuring less than 50%. Electronically Signed   By: Maude Naegeli M.D.   On: 06/19/2024 05:57   ECHOCARDIOGRAM COMPLETE Result Date: 06/18/2024    ECHOCARDIOGRAM REPORT   Patient Name:   Jonathan Cross. Date of Exam: 06/18/2024 Medical Rec #:  [id number]              Height:       72.0 in Accession #:    [id number]             Weight:       260.0 lb Date of Birth:  [birth year]              BSA:          2.382 m Patient Age:    78 years               BP:           144/67 mmHg Patient Gender: M                      HR:           93 bpm. Exam Location:  [facility] Procedure: 2D Echo, Cardiac Doppler, Color Doppler and Intracardiac            Opacification Agent (Both Spectral and Color Flow Doppler were            utilized during procedure). Indications:     Stroke I63.9  History:         Patient has prior history of Echocardiogram examinations, most                  recent 08/25/2023.                  Aortic Valve: 23 mm bioprosthetic valve is present in the                  aortic position. Procedure Date: 02/11/2016.  Sonographer:     Thedora Louder RDCS, FASE Referring Phys:  [id number] [facility] PAUDEL Diagnosing Phys: Darryle Decent MD  Sonographer Comments: Technically difficult study due to poor echo windows and patient is obese. Image acquisition challenging due to patient body habitus. IMPRESSIONS  1. 23 mm pericardial bioprosthetic AoV. Vmax 3.1 m/s, MG 19 mmHG. EOA and DI are inaccurate due to poor LVOT PW interrogation. 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 Gradients have  increased from prior study. Consider TEE or cardiac CTA for further interrogation. The aortic valve has been repaired/replaced. Aortic valve regurgitation is not visualized. There is a 23 mm bioprosthetic valve present in the aortic position. Procedure Date: 02/11/2016. 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 Aortic valve area, by VTI measures 1.52 cm. Aortic valve mean gradient measures 19.0 mmHg. Aortic valve Vmax measures 3.09 m/s.  2. Left ventricular ejection fraction, by estimation, is 60 to 65%. The left ventricle has normal function. The left ventricle has no regional wall motion abnormalities. Left ventricular diastolic parameters are consistent with Grade II diastolic dysfunction (pseudonormalization).  3. Right ventricular systolic function is normal. The right ventricular size is normal. Tricuspid regurgitation signal is inadequate for assessing PA pressure.  4. The mitral valve is grossly normal. Trivial mitral valve regurgitation. No evidence of mitral stenosis. Comparison(s): Changes from prior study are noted. 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 FINDINGS  Left Ventricle: Left ventricular ejection  fraction, by estimation, is 60 to 65%. The left ventricle has normal function. The left ventricle has no regional wall motion abnormalities. Definity  contrast agent was given IV to delineate the left ventricular  endocardial borders. The left ventricular internal cavity size was normal in size. There is no left ventricular hypertrophy. Abnormal (paradoxical) septal motion consistent with post-operative status. Left ventricular diastolic parameters are consistent  with Grade II diastolic dysfunction (pseudonormalization). Right Ventricle: The right ventricular size is normal. No increase in right ventricular wall thickness. Right ventricular systolic function is normal. Tricuspid regurgitation signal is inadequate for assessing PA pressure. Left Atrium: Left atrial  size was normal in size. Right Atrium: Right atrial size was normal in size. Pericardium: There is no evidence of pericardial effusion. Mitral Valve: The mitral valve is grossly normal. Trivial mitral valve regurgitation. No evidence of mitral valve stenosis. Tricuspid Valve: The tricuspid valve is grossly normal. Tricuspid valve regurgitation is trivial. No evidence of tricuspid stenosis. Aortic Valve: 23 mm pericardial bioprosthetic AoV. Vmax 3.1 m/s, MG 19 mmHG. EOA and DI are inaccurate due to poor LVOT PW interrogation. 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 Gradients have increased from prior study. Consider TEE or cardiac CTA for further interrogation. The aortic valve has been repaired/replaced. Aortic valve regurgitation is not visualized. Aortic valve mean gradient measures 19.0 mmHg. Aortic valve peak gradient measures 38.2 mmHg. Aortic valve area, by VTI measures 1.52 cm. There is a 23 mm bioprosthetic valve present in the aortic position. Procedure Date: 02/11/2016. Pulmonic Valve: The pulmonic valve was grossly normal. Pulmonic valve regurgitation is trivial. No evidence of pulmonic stenosis. Aorta: The aortic root and ascending aorta are structurally normal, with no evidence of dilitation. Venous: The right lower pulmonary vein is normal. The inferior vena cava was not well visualized. IAS/Shunts: The atrial septum is grossly normal.  LEFT VENTRICLE PLAX 2D LVIDd:         5.10 cm   Diastology LVIDs:         3.40 cm   LV e' medial:    3.81 cm/s LV PW:         1.10 cm   LV E/e' medial:  36.2 LV IVS:        1.00 cm   LV e' lateral:   11.60 cm/s LVOT diam:     1.90 cm   LV E/e' lateral: 11.9 LV SV:         89 LV SV Index:   37 LVOT Area:     2.84 cm  RIGHT VENTRICLE RV Basal diam:  2.80 cm RV S prime:     7.62 cm/s TAPSE (M-mode): 1.3 cm LEFT ATRIUM           Index        RIGHT ATRIUM           Index LA diam:      4.40 cm 1.85 cm/m   RA Area:     11.80 cm LA Vol (A2C):  29.8 ml 12.51 ml/m  RA Volume:   27.20 ml  11.42 ml/m LA Vol (A4C): 39.6 ml 16.63 ml/m  AORTIC VALVE                     PULMONIC VALVE AV Area (Vmax):    1.39 cm      PV Vmax:        0.96 m/s AV Area (Vmean):   1.44 cm      PV  Peak grad:   3.7 mmHg AV Area (VTI):     1.52 cm      RVOT Peak grad: 3 mmHg AV Vmax:           309.00 cm/s AV Vmean:          197.000 cm/s AV VTI:            0.586 m AV Peak Grad:      38.2 mmHg AV Mean Grad:      19.0 mmHg LVOT Vmax:         152.00 cm/s LVOT Vmean:        100.000 cm/s LVOT VTI:          0.314 m LVOT/AV VTI ratio: 0.54  AORTA Ao Root diam: 3.20 cm Ao Asc diam:  3.00 cm MITRAL VALVE MV Area (PHT): 6.37 cm     SHUNTS MV Decel Time: 119 msec     Systemic VTI:  0.31 m MV E velocity: 138.00 cm/s  Systemic Diam: 1.90 cm MV A velocity: 142.00 cm/s MV E/A ratio:  0.97 Darryle Decent MD Electronically signed by Darryle Decent MD Signature Date/Time: 06/18/2024/7:35:20 PM    Final    MR BRAIN WO CONTRAST Result Date: 06/18/2024 EXAM: MRI Brain Without Contrast 06/18/2024 07:02:00 PM TECHNIQUE: Multiplanar multisequence MRI of the head/brain was performed without the administration of intravenous contrast. COMPARISON: None available. CLINICAL HISTORY: Neuro deficit, acute, stroke suspected FINDINGS: BRAIN AND VENTRICLES: No acute infarct. No acute intracranial hemorrhage. A few chronic microhemorrhages. No mass. No midline shift. No hydrocephalus. 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 Normal flow voids. ORBITS: No acute abnormality. SINUSES AND MASTOIDS: No acute abnormality. BONES AND SOFT TISSUES: Normal marrow signal. No acute soft tissue abnormality. IMPRESSION: 1. No acute intracranial abnormality. 2. Remote left parietal and right occipital infarcts. Electronically signed by: Gilmore Molt MD 06/18/2024 07:31 PM EST RP Workstation: [id number]   CT CHEST ABDOMEN PELVIS W CONTRAST Result Date: 06/18/2024 EXAM: CT  CHEST, ABDOMEN AND PELVIS WITH CONTRAST 06/18/2024 05:30:52 AM TECHNIQUE: CT of the chest, abdomen and pelvis was performed with the administration of 100 mL of iohexol  (OMNIPAQUE ) 350 MG/ML injection. Multiplanar reformatted images are provided for review. Automated exposure control, iterative reconstruction, and/or weight based adjustment of the mA/kV was utilized to reduce the radiation dose to as low as reasonably achievable. COMPARISON: CT angiogram of the chest dated 06/01/2023 and CT of the abdomen and pelvis dated 12/08/2022. CLINICAL HISTORY: Sepsis; R abd tenderness, cough, sepsis. FINDINGS: CHEST: MEDIASTINUM AND LYMPH NODES: Heart demonstrates moderate calcific coronary artery disease and moderate aortic valvular disease. The thoracic aorta demonstrates moderate calcific atheromatous disease. There are numerous shotty mediastinal lymph nodes. The patient is status post sternotomy for aortic valve repair. The central airways are clear. No hilar or axillary lymphadenopathy. LUNGS AND PLEURA: There are numerous patchy and hazy opacities scattered throughout both lung zones suggestive of widespread infectious or inflammatory process. No pleural effusion or pneumothorax. ABDOMEN AND PELVIS: LIVER: The liver is unremarkable. GALLBLADDER AND BILE DUCTS: There are stones laying dependent within the gallbladder. No biliary ductal dilatation. SPLEEN: No acute abnormality. PANCREAS: No acute abnormality. ADRENAL GLANDS: No acute abnormality. KIDNEYS, URETERS AND BLADDER: There is a 6.5 cm left parapelvic renal cyst. Small simple cysts are also present within the right kidney. Per consensus, no follow-up is needed for simple Bosniak type 1 and 2 renal cysts, unless the patient has a malignancy history or risk factors. No  stones in the kidneys or ureters. No hydronephrosis. No perinephric or periureteral stranding. Urinary bladder is unremarkable. GI AND BOWEL: Stomach demonstrates no acute abnormality. There is no  bowel obstruction. REPRODUCTIVE ORGANS: No acute abnormality. PERITONEUM AND RETROPERITONEUM: No ascites. No free air. VASCULATURE: The abdominal aorta is normal in caliber and demonstrates moderate calcific atheromatous disease. ABDOMINAL AND PELVIS LYMPH NODES: No lymphadenopathy. BONES AND SOFT TISSUES: No acute osseous abnormality. No focal soft tissue abnormality. IMPRESSION: 1. Numerous patchy and hazy opacities throughout both lungs compatible with widespread infectious or inflammatory process. No pleural effusion or pneumothorax. Correlate clinically for infection. Recommend short-term radiographic or CT follow-up as clinically indicated to document resolution. 2. Cholelithiasis without biliary ductal dilatation. 3. Left parapelvic renal cyst measuring 6.5 cm and additional small simple right renal cysts; no follow up indicated. 4. Atherosclerotic calcifications involving the coronary arteries, aortic valve, thoracic aorta, and abdominal aorta. Status post median sternotomy for aortic valve repair. Electronically signed by: Evalene Coho MD 06/18/2024 05:53 AM EST RP Workstation: [id number]   CT Cervical Spine Wo Contrast Result Date: 06/18/2024 EXAM: CT CERVICAL SPINE WITHOUT CONTRAST 06/18/2024 05:30:52 AM TECHNIQUE: CT of the cervical spine was performed without the administration of intravenous contrast. Multiplanar reformatted images are provided for review. Automated exposure control, iterative reconstruction, and/or weight based adjustment of the mA/kV was utilized to reduce the radiation dose to as low as reasonably achievable. COMPARISON: None available. CLINICAL HISTORY: Neck trauma (Age >= 65y) FINDINGS: CERVICAL SPINE: BONES AND ALIGNMENT: There is no evidence of fracture or acute traumatic injury. There is straightening of the normal cervical lordosis. DEGENERATIVE CHANGES: There is multilevel chronic degenerative disc disease and facet arthropathy. The left facets at C2-C3 and C4-C5 are  fused. There is a posterior disc osteophyte complex present at C3-C4 causing mild-to-moderate central spinal canal stenosis and moderate left neural foraminal stenosis. There is moderate left facet arthrosis at C4-C5 secondary to facet hypertrophic changes. There is mild-to-moderate bilateral neural foraminal stenosis at C5-C6 and moderate bilateral neural foraminal stenosis and central spinal canal stenosis at C6-C7. SOFT TISSUES: No prevertebral soft tissue swelling. Calcifications within the carotid bulbs bilaterally. IMPRESSION: 1. No acute abnormality of the cervical spine related to the provided clinical history of neck trauma. 2. Multilevel chronic degenerative changes of the cervical spine as detailed, including mild-to-moderate central canal stenosis at C3-4 and C6-7, and multilevel neural foraminal stenoses. 3. Carotid bulb atherosclerotic calcifications bilaterally; consider correlation with cardiovascular risk factors and management as clinically indicated. Electronically signed by: Evalene Coho MD 06/18/2024 05:46 AM EST RP Workstation: [id number]   CT Head Wo Contrast Result Date: 06/18/2024 EXAM: CT HEAD WITHOUT CONTRAST 06/18/2024 05:30:52 AM TECHNIQUE: CT of the head was performed without the administration of intravenous contrast. Automated exposure control, iterative reconstruction, and/or weight based adjustment of the mA/kV was utilized to reduce the radiation dose to as low as reasonably achievable. COMPARISON: 06/01/2023 CLINICAL HISTORY: Head trauma, minor (Age >= 65y) FINDINGS: BRAIN AND VENTRICLES: No acute hemorrhage. Remote left parietal infarct. New age indeterminate right occipital lobe infarct. Moderate chronic microvascular ischemic change. No hydrocephalus. No extra-axial collection. No mass effect or midline shift. ORBITS: Right lens replacement. SINUSES: No acute abnormality. SOFT TISSUES AND SKULL: No acute soft tissue abnormality. No skull fracture. Partial left mastoid  effusion. IMPRESSION: 1. No acute intracranial abnormality related to the head trauma. 2. New age indeterminate right occipital lobe infarct. 3. Remote left parietal infarct. 4. Moderate chronic microvascular ischemic change. Electronically signed by: Evalene Coho MD  06/18/2024 05:41 AM EST RP Workstation: [id number]   DG Chest Port 1 View Result Date: 06/18/2024 EXAM: 1 VIEW(S) XRAY OF THE CHEST 06/18/2024 04:01:00 AM COMPARISON: 12/09/2023 CLINICAL HISTORY: Questionable sepsis - evaluate for abnormality FINDINGS: LUNGS AND PLEURA: Patchy airspace infiltrates within the left perihilar and retrocardiac regions, possibly infectious in nature. No pleural effusion. No pneumothorax. HEART AND MEDIASTINUM: CABG changes. Aortic atherosclerosis. No acute abnormality of the cardiac and mediastinal silhouettes. BONES AND SOFT TISSUES: Intact sternotomy wires. No acute osseous abnormality. IMPRESSION: 1. Patchy left perihilar and retrocardiac airspace infiltrates, suspicious for pneumonia. Electronically signed by: Dorethia Molt MD 06/18/2024 04:13 AM EST RP Workstation: [id number]    Scheduled Meds:   stroke: early stages of recovery book   Does not apply Once   enoxaparin  (LOVENOX ) injection  0.5 mg/kg Subcutaneous Q24H   furosemide   40 mg Oral BID   gabapentin   100 mg Oral TID   hydrOXYzine   10 mg Oral QHS   insulin  aspart  0-5 Units Subcutaneous QHS   insulin  aspart  0-9 Units Subcutaneous TID WC   insulin  aspart  3 Units Subcutaneous TID WC   insulin  glargine-yfgn  25 Units Subcutaneous Daily   metoprolol  succinate  12.5 mg Oral BID   nystatin  cream   Topical BID   ranolazine   500 mg Oral BID   rosuvastatin   40 mg Oral Daily   tamsulosin   0.4 mg Oral Daily   Continuous Infusions:   ceFAZolin  (ANCEF ) IV       Unresulted Labs (From admission, onward)     Start     Ordered   06/18/24 1148  Strep pneumoniae urinary antigen  Once,   R        06/18/24 1147   06/18/24 1148  Legionella  Pneumophila Serogp 1 Ur Ag  Once,   R        06/18/24 1147   06/18/24 1144  Hemoglobin A1c  (Labs)  Once,   R       Comments: To assess prior glycemic control    06/18/24 1144   06/18/24 1129  C Difficile Quick Screen w PCR reflex  (C Difficile quick screen w PCR reflex panel )  Once, for 24 hours,   TIMED       References:    CDiff Information Tool   06/18/24 1128   06/18/24 0358  Urine Culture  Once,   R        06/18/24 0358             LOS:  LOS: 1 day   Time Spent: 45 minutes  Vannessa Godown Al-Sultani, MD [facility]  If 7PM-7AM, please contact night-coverage  06/19/2024, 9:51 AM

## 2024-06-20 DIAGNOSIS — R6520 Severe sepsis without septic shock: Secondary | ICD-10-CM | POA: Diagnosis not present

## 2024-06-20 DIAGNOSIS — B957 Other staphylococcus as the cause of diseases classified elsewhere: Secondary | ICD-10-CM

## 2024-06-20 DIAGNOSIS — N390 Urinary tract infection, site not specified: Secondary | ICD-10-CM | POA: Diagnosis not present

## 2024-06-20 DIAGNOSIS — A419 Sepsis, unspecified organism: Secondary | ICD-10-CM | POA: Diagnosis not present

## 2024-06-20 DIAGNOSIS — R7881 Bacteremia: Secondary | ICD-10-CM

## 2024-06-20 LAB — BASIC METABOLIC PANEL WITH GFR
Anion gap: 10 (ref 5–15)
BUN: 16 mg/dL (ref 8–23)
CO2: 25 mmol/L (ref 22–32)
Calcium: 8.5 mg/dL — ABNORMAL LOW (ref 8.9–10.3)
Chloride: 103 mmol/L (ref 98–111)
Creatinine, Ser: 1.04 mg/dL (ref 0.61–1.24)
GFR, Estimated: >60 mL/min (ref >60)
Glucose, Bld: 137 mg/dL — ABNORMAL HIGH (ref 70–99)
Potassium: 3.4 mmol/L — ABNORMAL LOW (ref 3.5–5.1)
Sodium: 139 mmol/L (ref 135–145)

## 2024-06-20 LAB — CBC
HCT: 29.1 % — ABNORMAL LOW (ref 39.0–52.0)
Hemoglobin: 9.2 g/dL — ABNORMAL LOW (ref 13.0–17.0)
MCH: 36.4 pg — ABNORMAL HIGH (ref 26.0–34.0)
MCHC: 31.6 g/dL (ref 30.0–36.0)
MCV: 115 fL — ABNORMAL HIGH (ref 80.0–100.0)
Platelets: 136 K/uL — ABNORMAL LOW (ref 150–400)
RBC: 2.53 MIL/uL — ABNORMAL LOW (ref 4.22–5.81)
RDW: 15.8 % — ABNORMAL HIGH (ref 11.5–15.5)
WBC: 5.1 K/uL (ref 4.0–10.5)
nRBC: 0 % (ref 0.0–0.2)

## 2024-06-20 LAB — GLUCOSE, CAPILLARY
Glucose-Capillary: 143 mg/dL — ABNORMAL HIGH (ref 70–99)
Glucose-Capillary: 178 mg/dL — ABNORMAL HIGH (ref 70–99)
Glucose-Capillary: 186 mg/dL — ABNORMAL HIGH (ref 70–99)
Glucose-Capillary: 200 mg/dL — ABNORMAL HIGH (ref 70–99)
Glucose-Capillary: 208 mg/dL — ABNORMAL HIGH (ref 70–99)

## 2024-06-20 MED ORDER — SODIUM CHLORIDE 0.9 % IV SOLN
2.0000 g | INTRAVENOUS | Status: DC
  Administered 2024-06-20: 2 g via INTRAVENOUS
  Filled 2024-06-20: qty 20

## 2024-06-20 MED ORDER — VANCOMYCIN HCL 1750 MG/350ML IV SOLN
1750.0000 mg | INTRAVENOUS | Status: DC
  Administered 2024-06-21: 1750 mg via INTRAVENOUS
  Filled 2024-06-20 (×2): qty 350

## 2024-06-20 MED ORDER — VANCOMYCIN HCL 2000 MG/400ML IV SOLN
2000.0000 mg | Freq: Once | INTRAVENOUS | Status: AC
  Administered 2024-06-20: 2000 mg via INTRAVENOUS
  Filled 2024-06-20: qty 400

## 2024-06-20 NOTE — TOC Initial Note (Signed)
 Transition of Care (TOC) - Initial/Assessment Note    Patient Details  Name: Jonathan Cross. MRN: [id number] Date of Birth: Jun 14, 1946  Transition [facility] Care [facility]) CM/SW Contact:    Shasta DELENA Daring, RN Phone Number: 06/20/2024, 1:16 PM  Clinical Narrative:                 RNCM assessed patient. He was alone in his room. Patient said, I have been very sick for these last 2 days.  Advised that the team is recommending SNF for rehab. Patient says he has gone to [facility] before and would be amenable to returning there.   RNCM spoke to patient's daughter. He has CPAP at home. Has a home O2 set-up. She says it is prescribed as 3 L, PRN, but that patient does not use it very much.  Will follow.  Expected Discharge Plan: Skilled Nursing Facility Barriers to Discharge: Continued Medical Work up   Patient Goals and CMS Choice            Expected Discharge Plan and Services                                              Prior Living Arrangements/Services     Patient language and need for interpreter reviewed:: Yes        Need for Family Participation in Patient Care: Yes (Comment) Care giver support system in place?: Yes (comment)   Criminal Activity/Legal Involvement Pertinent to Current Situation/Hospitalization: No - Comment as needed  Activities of Daily Living      Permission Sought/Granted Permission sought to share information with : Case Manager, Magazine Features Editor Permission granted to share information with : Yes, Verbal Permission Granted              Emotional Assessment Appearance:: Appears stated age Attitude/Demeanor/Rapport: Gracious Affect (typically observed): Appropriate Orientation: : Oriented to Self, Oriented to Place, Oriented to  Time, Oriented to Situation Alcohol / Substance Use: Not Applicable    Admission diagnosis:  Occipital stroke (HCC) [I63.9] Acute cystitis without hematuria [N30.00] Severe sepsis  (HCC) [A41.9, R65.20] Community acquired pneumonia of left lung, unspecified part of lung [J18.9] Patient Active Problem List   Diagnosis Date Noted   Fall at home, initial encounter 06/18/2024   Sepsis secondary to UTI (HCC) 12/09/2023   Obesity, Class III, BMI 40-49.9 (morbid obesity) (HCC) 12/09/2023   Chest pain 08/24/2023   Iron  deficiency anemia 08/06/2023   Angina at rest 06/01/2023   CAP (community acquired pneumonia) 06/01/2023   UTI (urinary tract infection) 06/01/2023   Encephalopathy 06/01/2023   Rectal bleeding 05/14/2023   Pressure injury of skin 12/14/2022   Uncontrolled type 2 diabetes mellitus with hyperglycemia, with long-term current use of insulin  (HCC) 12/08/2022   AKI (acute kidney injury) 12/08/2022   Dry skin 10/21/2022   Atypical chest pain 08/11/2022   Fatigue 06/17/2022   Goals of care, counseling/discussion 06/16/2022   Myelodysplastic syndrome (HCC) 06/12/2022   Symptomatic anemia 05/01/2022   Chest pressure 05/01/2022   Chronic chest pain 02/17/2022   History of endocarditis 11/13/2021   Chronic anemia 11/13/2021   Acute on chronic HFrEF (heart failure with reduced ejection fraction) (HCC) 11/13/2021   Paroxysmal atrial fibrillation/A-flutter (HCC) 09/12/2021   Thrombocytopenia 09/12/2021   Vertigo, acute CVA ruled out via MRI, likely BPPV but assoc w/ ambulatory dysfunction and not  responsive to conservative tx 09/11/2021   Chronic heart failure with preserved ejection fraction (HFpEF) (HCC) 06/14/2021   Atrial fibrillation (HCC) 06/02/2021   Severe sepsis (HCC) 06/02/2021   Infection of scalp 06/02/2021   Elevated troponin 06/02/2021   Pancytopenia (HCC) 06/02/2021   Cellulitis of scalp 06/02/2021   Tinea corporis    Swelling of toe of left foot 11/02/2019   Aortic valve endocarditis    Coronary artery disease    Hypoalbuminemia 06/29/2019   Hyperglycemia 06/29/2019   Lactic acidosis 06/29/2019   GERD (gastroesophageal reflux disease)  06/29/2019   Staghorn renal calculus 06/29/2019   Nonrheumatic aortic valve stenosis 05/13/2019   Hyperlipidemia LDL goal <70 05/13/2019   Preop cardiovascular exam 05/13/2019   Bacteremia due to Enterococcus 04/18/2019   Class 2 severe obesity due to excess calories with serious comorbidity and body mass index (BMI) of 38.0 to 38.9 in adult 03/24/2018   S/P aortic valve replacement 08/07/2016   Status post coronary artery bypass graft 08/07/2016   Coronary artery disease of native artery of native heart with stable angina pectoris 02/11/2016   SVT (supraventricular tachycardia) 07/31/2015   Type 2 diabetes mellitus without complication, with long-term current use of insulin  (HCC) 03/28/2015   Benign prostatic hyperplasia 04/07/2014   OSA (obstructive sleep apnea) 04/07/2014   Essential hypertension 05/16/2009   PCP:  Roxanna Rocks, PA Pharmacy:   [facility] [address] ([address]), [place] - [address] [place] ([place]) [place] [address] Phone: [phone] Fax: [phone]  [facility] [address] [place] [address] Phone: [phone] Fax: [phone]     Social Drivers of Health (SDOH) Social History: SDOH Screenings   Food Insecurity: No Food Insecurity (06/18/2024)  Housing: Unknown (06/18/2024)  Transportation Needs: No Transportation Needs (06/18/2024)  Utilities: Not At Risk (06/18/2024)  Financial Resource Strain: Low Risk  (03/22/2024)   Received from [facility]  Physical Activity: Inactive (04/24/2021)   Received from [facility]  Social Connections: Socially Isolated (06/18/2024)  Stress: No Stress Concern Present (04/24/2021)   Received from [facility]  Tobacco Use: Medium Risk (03/31/2024)  Health Literacy: High Risk (02/25/2023)   Received from [facility]   SDOH Interventions:     Readmission Risk Interventions     No data to display

## 2024-06-20 NOTE — Progress Notes (Signed)
 PROGRESS NOTE    Jonathan Cross.  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  Patient reports generalized weakness with cough, shortness of breath, and difficulty urinating over the past few days. He sustained a fall while ambulating to the bathroom due to his weakness, and was unable to get up.  He was down for 15 minutes before arrival of his daughter who called EMS.  EMS reported BP 123/60, axillary temp of 102.3 F, SpO2 91% on RA.  In the ED, he was found to be afebrile with a temp of 98.7 F, RR 22, HR 92, BP 97/50, SpO2 96% on 2 L nasal cannula.  CBC showed WBC 16.3, Hgb 9.3, PLT 140.  CMP was remarkable for bicarb of 20.  Lactic acid was elevated to 2.1.  PT/INR elevated to 17/1.3.  4 Plex PCR negative.  UA showed amber-colored cloudy urine, >300 protein, moderate leukocytes, >50 WBC, many bacteria.  High-sensitivity troponin elevated to 73> 75.  Blood cultures and urine cultures were obtained.  CXR showed patchy left perihilar and retrocardiac airspace infiltrates suspicious for pneumonia CT head shows no acute traumatic abnormality, and new age-indeterminate right occipital infarct, remote left parietal infarct, and moderate chronic microvascular ischemic changes  CT C-spine showed no acute traumatic abnormality, multilevel chronic degenerative changes with mild to moderate central canal stenosis at C3-C4 and C6-C7 post multilevel foraminal stenoses, and bilateral carotid bulb atherosclerotic calcifications CT chest abdomen pelvis showed diffuse bilateral patchy/hazy pulmonary opacities  consistent with infectious/inflammatory process, cholelithiasis without biliary dilation, benign bilateral renal cysts, and extensive atherosclerotic calcifications with prior aortic valve repair  Patient was treated with a 2 L LR bolus, Flagyl , cefepime , vancomycin ,   Assessment and Plan:  Sepsis - Met sepsis criteria on admission with tachycardia to 104, tachypnea to 22, febrile to 102.3 F per EMS (98.7 F on arrival after receiving acetaminophen  by EMS), leukocytosis to 16.3.  Lactic acid elevated 2.1.  Pneumonia and UTI likely source of infection, later found to have staph epi bacteremia - Sepsis protocol initiated in the ED - Management of infectious processes as below  Staph epidermidis bacteremia Concern for endocarditis given presence of bioprosthetic aortic valve - BCx (12/6) positive for Staph epidermidis - TTE (12/7) without evidence of vegetation - Antibiotics revised Ancef  2 g every 8 hours - - ID consulted-Dr Al-Sultani discussed with Dr. Fleeta Rothman - Will likely need TEE given bioprosthetic aortic valve - Repeat blood cultures 12/8 AM  Acute hypoxic respiratory failure - resolved - Presented with several days of shortness of breath and cough for several days, with SpO2 88% when trialed on RA in the ED, with improvement to 95% on 2 L [place]. - Likely secondary to CAP - Patient currently on room air.  CAP - Presenting with cough and shortness of breath, hypoxic to 88% on RA - Procalcitonin 0.41 - CT chest with diffuse bilateral patchy/hazy pulmonary opacities consistent with infectious/inflammatory process - On Ancef  2g q8h   UTI - UA consistent with UTI - Urine cultures in process - On Ancef  2g q8h  Ruled out acute stroke -  CT head showed new age-indeterminate right occipital infarct with remote left parietal infarct - MRI brain was ordered to delineate chronicity, noted no intracranial abnormalities, remote left parietal and right occipital  infarcts  Fall Generalized Weakness - Progressive generalized weakness leading to fall and inability to get up, likely secondary to acute illness given above mentioned infectious etiologies  - PT/OT evaluated patient and recommended SNF placement - TOC consulted - Fall precautions  CAD s/p CABG x 2  - LHC (06/2022) showed patent LIMA to LAD and vein graft to second obtuse marginal, otherwise moderate, diffuse CAD - Continue Toprol -XL 12.5 mg twice daily, Ranexa  500 mg twice daily, Crestor  40 mg daily  Chronic HFpEF - TTE (08/2023) showed LVEF 65-70%, mild LVH, mildly reduced RV systolic function, trivial MR - TTE (06/19/2024) showed LVEF 60-65%, no RWMA, no LVH, G2 DD. - Continue Toprol -XL and p.o. Lasix  40 mg twice daily  Paroxysmal A-fib - Continue Toprol -XL 25 mg daily - Not on anticoagulation given history of falls, prior rectal bleeding, and anemia in the setting of mild dysplastic syndrome  Diarrhea - Patient reports multiple episodes of loose stools both at home and while currently hospitalized - GIPP and C. Diff PCR negative - Manage conservatively - PRN imodium   T2DM - Hemoglobin A1c pending - Continue Semglee  25 units daily, NovoLog  3 units TID AC, SSI 0-9 units AC, SSI 0-5 units HS - Hypoglycemia protocol  Neuropathy -Continue home gabapentin  100 mg 3 times daily  BPH - Continue Flomax  0.4 mg daily  MDS - Follows with heme/onc - Hgb stable   Class II Obesity (BMI Body mass index is 35.26 kg/m. kg/m) - Obesity is clinically significant and contributes to T2DM, HTN, HLD, HFpEF, CAD, AFib, and OSA.  - Recommend continued discussion with PCP re: lifestyle modification including dietary changes, regular physical activity, and weight reduction strategies.    DVT prophylaxis: Lovenox    Code Status:   Code Status: Prior  Family Communication: Spoke with patient's daughter on the phone  Disposition Plan: Likely SNF pending clinical improvement PT - Follow Up  Recommendations: Skilled nursing-short term rehab (<3 hours/day) OT - Follow Up Recommendations: Skilled nursing-short term rehab (<3 hours/day)  DME Needs: PT equipment: Other (comment) (TBD at next venue of care)     Level of care: Progressive  Consultants:  ID  Procedures:  None  Antimicrobials: Ancef     Subjective: Patient seen and examined at the bedside.  He is not in any acute distress.  He is weaned down to room air.  Reports of some shoulder pain which is chronic.  Vital signs are stable.  He is afebrile  Objective: Vitals:   06/19/24 2037 06/19/24 2331 06/20/24 0404 06/20/24 0841  BP: 116/62 134/69 136/69 123/62  Pulse: 85 78  78  Resp: 20 12 12 18   Temp: 97.9 F (36.6 C) 98 F (36.7 C) 98.6 F (37 C) 98 F (36.7 C)  TempSrc:   Oral Oral  SpO2: 90% 94%  94%  Weight:      Height:        Intake/Output Summary (Last 24 hours) at 06/20/2024 1241 Last data filed at 06/20/2024 1011 Gross per 24 hour  Intake 360 ml  Output 250 ml  Net 110 ml   Filed Weights   06/18/24 0511  Weight: 117.9 kg    Examination:  Gen: NAD, A&Ox3 HEENT: NCAT, EOMI Neck: Supple, no JVD CV: RRR, no murmurs Resp: normal WOB, breath sounds diminished bilaterally, mild diffuse bilateral expiratory  wheezing Abd: Soft, NTND, no guarding Ext: No LE edema, pulses 2+ b/l Skin: Warm, dry Neuro: CN II-XII grossly intact, no focal deficit noted Psych: Calm, cooperative, appropriate affect   Data Reviewed: I have personally reviewed following labs and imaging studies  CBC: Recent Labs  Lab 06/18/24 0358 06/19/24 0613 06/20/24 0530  WBC 16.3* 9.6 5.1  NEUTROABS 13.5*  --   --   HGB 9.3* 9.3* 9.2*  HCT 28.9* 29.8* 29.1*  MCV 113.8* 115.1* 115.0*  PLT 140* 131* 136*   Basic Metabolic Panel: Recent Labs  Lab 06/18/24 0358 06/18/24 1229 06/19/24 0613 06/19/24 1438 06/20/24 0530  NA 136 135  --  134* 139  K 3.7 3.8  --  3.5 3.4*  CL 104 102  --  101 103  CO2 20* 19*   --  22 25  GLUCOSE 147* 190*  --  210* 137*  BUN 14 13  --  16 16  CREATININE 1.11 1.02  --  1.09 1.04  CALCIUM  8.6* 8.5*  --  8.2* 8.5*  MG  --   --  1.8  --   --    GFR: Estimated Creatinine Clearance: 75.1 mL/min (by C-G formula based on SCr of 1.04 mg/dL). Liver Function Tests: Recent Labs  Lab 06/18/24 0358 06/18/24 1229  AST 20 20  ALT 14 12  ALKPHOS 59 56  BILITOT 0.9 0.8  PROT 7.7 7.4  ALBUMIN 3.6 3.4*   No results for input(s): LIPASE, AMYLASE in the last 168 hours. No results for input(s): AMMONIA in the last 168 hours. Coagulation Profile: Recent Labs  Lab 06/18/24 0358  INR 1.3*   Cardiac Enzymes: No results for input(s): CKTOTAL, CKMB, CKMBINDEX, TROPONINI in the last 168 hours. BNP (last 3 results) No results for input(s): PROBNP in the last 8760 hours. HbA1C: No results for input(s): HGBA1C in the last 72 hours. CBG: Recent Labs  Lab 06/19/24 1256 06/19/24 1629 06/19/24 2039 06/20/24 0839 06/20/24 1146  GLUCAP 199* 184* 252* 143* 186*   Lipid Profile: Recent Labs    06/19/24 0613  CHOL 135  HDL 17*  LDLCALC 81  TRIG 812*  CHOLHDL 8.1   Thyroid Function Tests: No results for input(s): TSH, T4TOTAL, FREET4, T3FREE, THYROIDAB in the last 72 hours. Anemia Panel: No results for input(s): VITAMINB12, FOLATE, FERRITIN, TIBC, IRON , RETICCTPCT in the last 72 hours. Sepsis Labs: Recent Labs  Lab 06/18/24 0358 06/18/24 0604 06/18/24 1229  PROCALCITON  --   --  0.41  LATICACIDVEN 2.1* 1.7  --     Recent Results (from the past 240 hours)  Blood Culture (routine x 2)     Status: Abnormal (Preliminary result)   Collection Time: 06/18/24  3:58 AM   Specimen: BLOOD  Result Value Ref Range Status   Specimen Description   Final    BLOOD BLOOD LEFT ARM Performed at [facility], [address] [place], [place] [address]    Special Requests   Final    BOTTLES DRAWN AEROBIC AND ANAEROBIC Blood  Culture adequate volume Performed at [facility], [address], [place], [place] [address]    Culture  Setup Time   Final    GRAM POSITIVE COCCI AEROBIC BOTTLE ONLY CRITICAL VALUE NOTED.  VALUE IS CONSISTENT WITH PREVIOUSLY REPORTED AND CALLED VALUE. Performed at [facility], [address] [place], [place] [address]    Culture (A)  Final    STAPHYLOCOCCUS CAPITIS THE SIGNIFICANCE OF ISOLATING THIS ORGANISM FROM  A SINGLE SET OF BLOOD CULTURES WHEN MULTIPLE SETS ARE DRAWN IS UNCERTAIN. PLEASE NOTIFY THE MICROBIOLOGY DEPARTMENT WITHIN ONE WEEK IF SPECIATION AND SENSITIVITIES ARE REQUIRED. CULTURE REINCUBATED FOR BETTER GROWTH Performed at [facility], [address] [place], [place] [address]    Report Status PENDING  Incomplete  Blood Culture (routine x 2)     Status: Abnormal (Preliminary result)   Collection Time: 06/18/24  3:58 AM   Specimen: BLOOD  Result Value Ref Range Status   Specimen Description   Final    BLOOD BLOOD RIGHT ARM Performed at [facility], [address] [place], [place] [address]    Special Requests   Final    BOTTLES DRAWN AEROBIC AND ANAEROBIC Blood Culture adequate volume Performed at [facility], [address] [place], [place] [address]    Culture  Setup Time   Final    GRAM POSITIVE COCCI IN BOTH AEROBIC AND ANAEROBIC BOTTLES CRITICAL RESULT CALLED TO, READ BACK BY AND VERIFIED WITH: JASON ROBBINS, PHARMD @0411  06/19/2024 COP    Culture (A)  Final    STAPHYLOCOCCUS LUGDUNENSIS SUSCEPTIBILITIES TO FOLLOW CULTURE REINCUBATED FOR BETTER GROWTH Performed at [facility], [address], [place], [place] [address]    Report Status PENDING  Incomplete  Resp panel by RT-PCR (RSV, Flu A&B, Covid) Urine, Clean Catch     Status: None   Collection Time: 06/18/24  3:58 AM   Specimen: Urine, Clean Catch; Nasal Swab  Result Value Ref Range Status   SARS Coronavirus 2 by RT PCR NEGATIVE NEGATIVE  Final    Comment: (NOTE) SARS-CoV-2 target nucleic acids are NOT DETECTED.  The SARS-CoV-2 RNA is generally detectable in upper respiratory specimens during the acute phase of infection. The lowest concentration of SARS-CoV-2 viral copies this assay can detect is 138 copies/mL. A negative result does not preclude SARS-Cov-2 infection and should not be used as the sole basis for treatment or other patient management decisions. A negative result may occur with  improper specimen collection/handling, submission of specimen other than nasopharyngeal swab, presence of viral mutation(s) within the areas targeted by this assay, and inadequate number of viral copies(<138 copies/mL). A negative result must be combined with clinical observations, patient history, and epidemiological information. The expected result is Negative.  Fact Sheet for Patients:  bloggercourse.com  Fact Sheet for Healthcare Providers:  seriousbroker.it  This test is no t yet approved or cleared by the [place]  FDA and  has been authorized for detection and/or diagnosis of SARS-CoV-2 by FDA under an Emergency Use Authorization (EUA). This EUA will remain  in effect (meaning this test can be used) for the duration of the COVID-19 declaration under Section 564(b)(1) of the Act, 21 U.S.C.section 360bbb-3(b)(1), unless the authorization is terminated  or revoked sooner.       Influenza A by PCR NEGATIVE NEGATIVE Final   Influenza B by PCR NEGATIVE NEGATIVE Final    Comment: (NOTE) The Xpert Xpress SARS-CoV-2/FLU/RSV plus assay is intended as an aid in the diagnosis of influenza from Nasopharyngeal swab specimens and should not be used as a sole basis for treatment. Nasal washings and aspirates are unacceptable for Xpert Xpress SARS-CoV-2/FLU/RSV testing.  Fact Sheet for Patients: bloggercourse.com  Fact Sheet for Healthcare  Providers: seriousbroker.it  This test is not yet approved or cleared by the [place]  FDA and has been authorized for detection and/or diagnosis of SARS-CoV-2 by FDA under an Emergency Use Authorization (EUA). This EUA will  remain in effect (meaning this test can be used) for the duration of the COVID-19 declaration under Section 564(b)(1) of the Act, 21 U.S.C. section 360bbb-3(b)(1), unless the authorization is terminated or revoked.     Resp Syncytial Virus by PCR NEGATIVE NEGATIVE Final    Comment: (NOTE) Fact Sheet for Patients: bloggercourse.com  Fact Sheet for Healthcare Providers: seriousbroker.it  This test is not yet approved or cleared by the [place]  FDA and has been authorized for detection and/or diagnosis of SARS-CoV-2 by FDA under an Emergency Use Authorization (EUA). This EUA will remain in effect (meaning this test can be used) for the duration of the COVID-19 declaration under Section 564(b)(1) of the Act, 21 U.S.C. section 360bbb-3(b)(1), unless the authorization is terminated or revoked.  Performed at [facility], [address] [place], [place] [address]   Blood Culture ID Panel (Reflexed)     Status: Abnormal   Collection Time: 06/18/24  3:58 AM  Result Value Ref Range Status   Enterococcus faecalis NOT DETECTED NOT DETECTED Final   Enterococcus Faecium NOT DETECTED NOT DETECTED Final   Listeria monocytogenes NOT DETECTED NOT DETECTED Final   Staphylococcus species DETECTED (A) NOT DETECTED Final    Comment: CRITICAL RESULT CALLED TO, READ BACK BY AND VERIFIED WITH: JASON ROBBINS, PHARMD @0411  06/19/2024 COP    Staphylococcus aureus (BCID) NOT DETECTED NOT DETECTED Final   Staphylococcus epidermidis DETECTED (A) NOT DETECTED Final    Comment: CRITICAL RESULT CALLED TO, READ BACK BY AND VERIFIED WITH: JASON ROBBINS, PHARMD @0411  06/19/2024 COP     Staphylococcus lugdunensis NOT DETECTED NOT DETECTED Final   Streptococcus species NOT DETECTED NOT DETECTED Final   Streptococcus agalactiae NOT DETECTED NOT DETECTED Final   Streptococcus pneumoniae NOT DETECTED NOT DETECTED Final   Streptococcus pyogenes NOT DETECTED NOT DETECTED Final   A.calcoaceticus-baumannii NOT DETECTED NOT DETECTED Final   Bacteroides fragilis NOT DETECTED NOT DETECTED Final   Enterobacterales NOT DETECTED NOT DETECTED Final   Enterobacter cloacae complex NOT DETECTED NOT DETECTED Final   Escherichia coli NOT DETECTED NOT DETECTED Final   Klebsiella aerogenes NOT DETECTED NOT DETECTED Final   Klebsiella oxytoca NOT DETECTED NOT DETECTED Final   Klebsiella pneumoniae NOT DETECTED NOT DETECTED Final   Proteus species NOT DETECTED NOT DETECTED Final   Salmonella species NOT DETECTED NOT DETECTED Final   Serratia marcescens NOT DETECTED NOT DETECTED Final   Haemophilus influenzae NOT DETECTED NOT DETECTED Final   Neisseria meningitidis NOT DETECTED NOT DETECTED Final   Pseudomonas aeruginosa NOT DETECTED NOT DETECTED Final   Stenotrophomonas maltophilia NOT DETECTED NOT DETECTED Final   Candida albicans NOT DETECTED NOT DETECTED Final   Candida auris NOT DETECTED NOT DETECTED Final   Candida glabrata NOT DETECTED NOT DETECTED Final   Candida krusei NOT DETECTED NOT DETECTED Final   Candida parapsilosis NOT DETECTED NOT DETECTED Final   Candida tropicalis NOT DETECTED NOT DETECTED Final   Cryptococcus neoformans/gattii NOT DETECTED NOT DETECTED Final   Methicillin resistance mecA/C NOT DETECTED NOT DETECTED Final    Comment: Performed at [facility], [address] [place], [place] [address]  C Difficile Quick Screen w PCR reflex     Status: None   Collection Time: 06/18/24 12:53 PM   Specimen: STOOL  Result Value Ref Range Status   C Diff antigen NEGATIVE NEGATIVE Final   C Diff toxin NEGATIVE NEGATIVE Final   C Diff interpretation No C. difficile  detected.  Final    Comment: Performed  at [facility], [address] [place], [place] [address]  Gastrointestinal Panel by PCR , Stool     Status: None   Collection Time: 06/19/24 12:53 PM   Specimen: Stool  Result Value Ref Range Status   Campylobacter species NOT DETECTED NOT DETECTED Final   Plesimonas shigelloides NOT DETECTED NOT DETECTED Final   Salmonella species NOT DETECTED NOT DETECTED Final   Yersinia enterocolitica NOT DETECTED NOT DETECTED Final   Vibrio species NOT DETECTED NOT DETECTED Final   Vibrio cholerae NOT DETECTED NOT DETECTED Final   Enteroaggregative E coli (EAEC) NOT DETECTED NOT DETECTED Final   Enteropathogenic E coli (EPEC) NOT DETECTED NOT DETECTED Final   Enterotoxigenic E coli (ETEC) NOT DETECTED NOT DETECTED Final   Shiga like toxin producing E coli (STEC) NOT DETECTED NOT DETECTED Final   Shigella/Enteroinvasive E coli (EIEC) NOT DETECTED NOT DETECTED Final   Cryptosporidium NOT DETECTED NOT DETECTED Final   Cyclospora cayetanensis NOT DETECTED NOT DETECTED Final   Entamoeba histolytica NOT DETECTED NOT DETECTED Final   Giardia lamblia NOT DETECTED NOT DETECTED Final   Adenovirus F40/41 NOT DETECTED NOT DETECTED Final   Astrovirus NOT DETECTED NOT DETECTED Final   Norovirus GI/GII NOT DETECTED NOT DETECTED Final   Rotavirus A NOT DETECTED NOT DETECTED Final   Sapovirus (I, II, IV, and V) NOT DETECTED NOT DETECTED Final    Comment: Performed at [facility], [address] [place], [place] [address]  Culture, blood (Routine X 2) w Reflex to ID Panel     Status: None (Preliminary result)   Collection Time: 06/20/24  5:36 AM   Specimen: BLOOD  Result Value Ref Range Status   Specimen Description BLOOD BLOOD RIGHT HAND  Final   Special Requests   Final    BOTTLES DRAWN AEROBIC AND ANAEROBIC Blood Culture adequate volume   Culture   Final    NO GROWTH < 12 HOURS Performed at [facility], [address]  [place], [place] [address]    Report Status PENDING  Incomplete     Radiology Studies: US  Carotid Bilateral (at [facility] and [facility] only) Result Date: 06/19/2024 CLINICAL DATA:  Transient ischemic attack EXAM: BILATERAL CAROTID DUPLEX ULTRASOUND TECHNIQUE: Elnor scale imaging, color Doppler and duplex ultrasound were performed of bilateral carotid and vertebral arteries in the neck. COMPARISON:  None Available. FINDINGS: Criteria: Quantification of carotid stenosis is based on velocity parameters that correlate the residual internal carotid diameter with NASCET-based stenosis levels, using the diameter of the distal internal carotid lumen as the denominator for stenosis measurement. The following velocity measurements were obtained: RIGHT ICA: 102 cm/sec CCA: 114 cm/sec SYSTOLIC ICA/CCA RATIO:  1.1 ECA: 130 cm/sec LEFT ICA: 98 cm/sec CCA: 104 cm/sec SYSTOLIC ICA/CCA RATIO:  1.1 ECA: 139 cm/sec RIGHT CAROTID ARTERY: Mild diffuse atherosclerotic changes. There is a single focus of mild velocity elevation in the distal ICA without clear stenotic lesion at that site. RIGHT VERTEBRAL ARTERY:  Antegrade. LEFT CAROTID ARTERY:  Mild diffuse atherosclerotic changes. LEFT VERTEBRAL ARTERY:  Antegrade. IMPRESSION: 1. Right carotid system: Mild atherosclerotic changes with estimated stenosis measuring less than 50%. 2. Left carotid system: Mild atherosclerotic changes with estimated stenosis measuring less than 50%. Electronically Signed   By: Maude Naegeli M.D.   On: 06/19/2024 05:57   ECHOCARDIOGRAM COMPLETE Result Date: 06/18/2024    ECHOCARDIOGRAM REPORT   Patient Name:   Jonathan Cross. Date of Exam: 06/18/2024 Medical Rec #:  [id number]  Height:       72.0 in Accession #:    [id number]             Weight:       260.0 lb Date of Birth:  02-28-1946              BSA:          2.382 m Patient Age:    78 years               BP:           144/67 mmHg Patient Gender: M                      HR:           93 bpm. Exam  Location:  [facility] Procedure: 2D Echo, Cardiac Doppler, Color Doppler and Intracardiac            Opacification Agent (Both Spectral and Color Flow Doppler were            utilized during procedure). Indications:     Stroke I63.9  History:         Patient has prior history of Echocardiogram examinations, most                  recent 08/25/2023.                  Aortic Valve: 23 mm bioprosthetic valve is present in the                  aortic position. Procedure Date: 02/11/2016.  Sonographer:     Thedora Louder RDCS, FASE Referring Phys:  [id number] [facility] PAUDEL Diagnosing Phys: Darryle Decent MD  Sonographer Comments: Technically difficult study due to poor echo windows and patient is obese. Image acquisition challenging due to patient body habitus. IMPRESSIONS  1. 23 mm pericardial bioprosthetic AoV. Vmax 3.1 m/s, MG 19 mmHG. EOA and DI are inaccurate due to poor LVOT PW interrogation. 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 Gradients have increased from prior study. Consider TEE or cardiac CTA for further interrogation. The aortic valve has been repaired/replaced. Aortic valve regurgitation is not visualized. There is a 23 mm bioprosthetic valve present in the aortic position. Procedure Date: 02/11/2016. 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 Aortic valve area, by VTI measures 1.52 cm. Aortic valve mean gradient measures 19.0 mmHg. Aortic valve Vmax measures 3.09 m/s.  2. Left ventricular ejection fraction, by estimation, is 60 to 65%. The left ventricle has normal function. The left ventricle has no regional wall motion abnormalities. Left ventricular diastolic parameters are consistent with Grade II diastolic dysfunction (pseudonormalization).  3. Right ventricular systolic function is normal. The right ventricular size is normal. Tricuspid regurgitation signal is inadequate for assessing PA pressure.  4. The mitral valve is grossly normal. Trivial mitral  valve regurgitation. No evidence of mitral stenosis. Comparison(s): Changes from prior study are noted. 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 FINDINGS  Left Ventricle: Left ventricular ejection fraction, by estimation, is 60 to 65%. The left ventricle has normal function. The left ventricle has no regional wall motion abnormalities. Definity  contrast agent was given IV to delineate the left ventricular  endocardial borders. The left ventricular internal cavity size was normal in size. There is no left ventricular hypertrophy. Abnormal (paradoxical) septal motion consistent with post-operative status. Left ventricular diastolic parameters are consistent  with Grade II diastolic  dysfunction (pseudonormalization). Right Ventricle: The right ventricular size is normal. No increase in right ventricular wall thickness. Right ventricular systolic function is normal. Tricuspid regurgitation signal is inadequate for assessing PA pressure. Left Atrium: Left atrial size was normal in size. Right Atrium: Right atrial size was normal in size. Pericardium: There is no evidence of pericardial effusion. Mitral Valve: The mitral valve is grossly normal. Trivial mitral valve regurgitation. No evidence of mitral valve stenosis. Tricuspid Valve: The tricuspid valve is grossly normal. Tricuspid valve regurgitation is trivial. No evidence of tricuspid stenosis. Aortic Valve: 23 mm pericardial bioprosthetic AoV. Vmax 3.1 m/s, MG 19 mmHG. EOA and DI are inaccurate due to poor LVOT PW interrogation. 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 Gradients have increased from prior study. Consider TEE or cardiac CTA for further interrogation. The aortic valve has been repaired/replaced. Aortic valve regurgitation is not visualized. Aortic valve mean gradient measures 19.0 mmHg. Aortic valve peak gradient measures 38.2 mmHg. Aortic valve area, by VTI measures 1.52 cm. There is a 23 mm bioprosthetic valve  present in the aortic position. Procedure Date: 02/11/2016. Pulmonic Valve: The pulmonic valve was grossly normal. Pulmonic valve regurgitation is trivial. No evidence of pulmonic stenosis. Aorta: The aortic root and ascending aorta are structurally normal, with no evidence of dilitation. Venous: The right lower pulmonary vein is normal. The inferior vena cava was not well visualized. IAS/Shunts: The atrial septum is grossly normal.  LEFT VENTRICLE PLAX 2D LVIDd:         5.10 cm   Diastology LVIDs:         3.40 cm   LV e' medial:    3.81 cm/s LV PW:         1.10 cm   LV E/e' medial:  36.2 LV IVS:        1.00 cm   LV e' lateral:   11.60 cm/s LVOT diam:     1.90 cm   LV E/e' lateral: 11.9 LV SV:         89 LV SV Index:   37 LVOT Area:     2.84 cm  RIGHT VENTRICLE RV Basal diam:  2.80 cm RV S prime:     7.62 cm/s TAPSE (M-mode): 1.3 cm LEFT ATRIUM           Index        RIGHT ATRIUM           Index LA diam:      4.40 cm 1.85 cm/m   RA Area:     11.80 cm LA Vol (A2C): 29.8 ml 12.51 ml/m  RA Volume:   27.20 ml  11.42 ml/m LA Vol (A4C): 39.6 ml 16.63 ml/m  AORTIC VALVE                     PULMONIC VALVE AV Area (Vmax):    1.39 cm      PV Vmax:        0.96 m/s AV Area (Vmean):   1.44 cm      PV Peak grad:   3.7 mmHg AV Area (VTI):     1.52 cm      RVOT Peak grad: 3 mmHg AV Vmax:           309.00 cm/s AV Vmean:          197.000 cm/s AV VTI:            0.586 m AV Peak Grad:  38.2 mmHg AV Mean Grad:      19.0 mmHg LVOT Vmax:         152.00 cm/s LVOT Vmean:        100.000 cm/s LVOT VTI:          0.314 m LVOT/AV VTI ratio: 0.54  AORTA Ao Root diam: 3.20 cm Ao Asc diam:  3.00 cm MITRAL VALVE MV Area (PHT): 6.37 cm     SHUNTS MV Decel Time: 119 msec     Systemic VTI:  0.31 m MV E velocity: 138.00 cm/s  Systemic Diam: 1.90 cm MV A velocity: 142.00 cm/s MV E/A ratio:  0.97 Darryle Decent MD Electronically signed by Darryle Decent MD Signature Date/Time: 06/18/2024/7:35:20 PM    Final    MR BRAIN WO CONTRAST Result  Date: 06/18/2024 EXAM: MRI Brain Without Contrast 06/18/2024 07:02:00 PM TECHNIQUE: Multiplanar multisequence MRI of the head/brain was performed without the administration of intravenous contrast. COMPARISON: None available. CLINICAL HISTORY: Neuro deficit, acute, stroke suspected FINDINGS: BRAIN AND VENTRICLES: No acute infarct. No acute intracranial hemorrhage. A few chronic microhemorrhages. No mass. No midline shift. No hydrocephalus. 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 Normal flow voids. ORBITS: No acute abnormality. SINUSES AND MASTOIDS: No acute abnormality. BONES AND SOFT TISSUES: Normal marrow signal. No acute soft tissue abnormality. IMPRESSION: 1. No acute intracranial abnormality. 2. Remote left parietal and right occipital infarcts. Electronically signed by: Gilmore Molt MD 06/18/2024 07:31 PM EST RP Workstation: [id number]    Scheduled Meds:  enoxaparin  (LOVENOX ) injection  0.5 mg/kg Subcutaneous Q24H   furosemide   40 mg Oral BID   gabapentin   100 mg Oral TID   hydrOXYzine   10 mg Oral QHS   insulin  aspart  0-5 Units Subcutaneous QHS   insulin  aspart  0-9 Units Subcutaneous TID WC   insulin  aspart  3 Units Subcutaneous TID WC   insulin  glargine-yfgn  25 Units Subcutaneous Daily   metoprolol  succinate  12.5 mg Oral BID   nystatin  cream   Topical BID   ranolazine   500 mg Oral BID   rosuvastatin   40 mg Oral Daily   tamsulosin   0.4 mg Oral Daily   Continuous Infusions:   ceFAZolin  (ANCEF ) IV 2 g (06/20/24 0559)     Unresulted Labs (From admission, onward)     Start     Ordered   06/20/24 0500  Culture, blood (Routine X 2) w Reflex to ID Panel  BLOOD CULTURE X 2,   TIMED      06/19/24 1406   06/20/24 0500  CBC  Daily,   R     Question:  Specimen collection method  Answer:  Lab=Lab collect   06/19/24 1407   06/19/24 1408  Basic metabolic panel with GFR  Daily,   R     Question:  Specimen collection method  Answer:   Lab=Lab collect   06/19/24 1407   06/18/24 1148  Strep pneumoniae urinary antigen  Once,   R        06/18/24 1147   06/18/24 1148  Legionella Pneumophila Serogp 1 Ur Ag  Once,   R        06/18/24 1147   06/18/24 1144  Hemoglobin A1c  (Labs)  Once,   R       Comments: To assess prior glycemic control    06/18/24 1144   06/18/24 0358  Urine Culture  Once,   R        06/18/24 0358  LOS:  LOS: 2 days   Time Spent: 35 minutes  Derryl Duval, MD [facility]  If 7PM-7AM, please contact night-coverage  06/20/2024, 12:41 PM

## 2024-06-20 NOTE — Progress Notes (Signed)
 Physical Therapy Treatment Patient Details Name: Jonathan Cross. MRN: [id number] DOB: 1946/03/29 Today's Date: 06/20/2024   History of Present Illness Pt is an 78 year old male presented to the ED after a fall at home, admitted with UTI, sepsis, CAP, c-diff, and stroke work up. Per MRI impression: no acute intracranial abnormality, remote left parietal and right occipital infarcts. PMH significant for diabetes, HTN, GERD, HLD, 2 of bacterial endocarditis, CAD s/p aortic valve replacement, A-fib not on anticoagulation, recurrent skin abscesses, history of MRSA.    PT Comments  Patient seen for PT session focused on supine. Patient required minA for room ambulation and transfers and used RW . Tolerated session well with no signs of exertion or distress. Vitals remained stable during activity. Main limiting factors today were generalized weakness and fatigue. Interventions aimed at improving activity tolerance. Patient shows good potential to make progress with continued acute level rehab. Patient continues to demonstrate mild to moderate activity restrictions and poor tolerance for progressive mobility. Continued skilled PT recommended to progress toward functional goals and support discharge readiness. Pt making good progress toward goals, will continue to follow POC. Discharge recommendation remains appropriate     If plan is discharge home, recommend the following: A lot of help with walking and/or transfers;A little help with bathing/dressing/bathroom;Assistance with cooking/housework;Help with stairs or ramp for entrance;Assist for transportation   Can travel by private vehicle     No  Equipment Recommendations  None recommended by PT    Recommendations for Other Services       Precautions / Restrictions Precautions Precautions: Fall Recall of Precautions/Restrictions: Impaired Restrictions Weight Bearing Restrictions Per Provider Order: No Other Position/Activity Restrictions:  (+) c-diff     Mobility  Bed Mobility Overal bed mobility: Needs Assistance Bed Mobility: Rolling Rolling: Supervision   Supine to sit: HOB elevated, Used rails, Contact guard Sit to supine: Used rails, Contact guard assist        Transfers Overall transfer level: Needs assistance Equipment used: 1 person hand held assist Transfers: Sit to/from Stand, Bed to chair/wheelchair/BSC Sit to Stand: Min assist                Ambulation/Gait Ambulation/Gait assistance: Min assist Gait Distance (Feet): 15 Feet Assistive device: Rolling walker (2 wheels) Gait Pattern/deviations: Step-through pattern, Drifts right/left, Trunk flexed, Decreased stride length Gait velocity: dcreased     General Gait Details: vc for RW management; tight space in room needs several vc for safety and directional movement   Stairs             Wheelchair Mobility     Tilt Bed    Modified Rankin (Stroke Patients Only)       Balance Overall balance assessment: Needs assistance Sitting-balance support: Feet supported Sitting balance-Leahy Scale: Good     Standing balance support: Single extremity supported, During functional activity, Reliant on assistive device for balance Standing balance-Leahy Scale: Poor                              Communication Communication Communication: Impaired Factors Affecting Communication: Hearing impaired  Cognition Arousal: Alert Behavior During Therapy: WFL for tasks assessed/performed   PT - Cognitive impairments: No apparent impairments                         Following commands: Impaired Following commands impaired: Follows one step commands with increased time  Cueing Cueing Techniques: Verbal cues, Tactile cues, Visual cues  Exercises      General Comments        Pertinent Vitals/Pain Pain Assessment Pain Assessment: No/denies pain Faces Pain Scale: Hurts little more Pain Location: genrealized, L  trunk/chest area Pain Descriptors / Indicators: Discomfort, Grimacing Pain Intervention(s): Repositioned, Monitored during session    Home Living                          Prior Function            PT Goals (current goals can now be found in the care plan section) Acute Rehab PT Goals Patient Stated Goal: To feel better PT Goal Formulation: With patient Time For Goal Achievement: 07/02/24 Potential to Achieve Goals: Good Progress towards PT goals: Progressing toward goals    Frequency    Min 2X/week      PT Plan      Co-evaluation              AM-PAC PT 6 Clicks Mobility   Outcome Measure  Help needed turning from your back to your side while in a flat bed without using bedrails?: A Little Help needed moving from lying on your back to sitting on the side of a flat bed without using bedrails?: A Little Help needed moving to and from a bed to a chair (including a wheelchair)?: A Little Help needed standing up from a chair using your arms (e.g., wheelchair or bedside chair)?: A Little Help needed to walk in hospital room?: A Lot Help needed climbing 3-5 steps with a railing? : A Lot 6 Click Score: 16    End of Session Equipment Utilized During Treatment: Gait belt Activity Tolerance: Patient tolerated treatment well Patient left: in bed;with nursing/sitter in room;Other (comment) Nurse Communication: Mobility status PT Visit Diagnosis: Muscle weakness (generalized) (M62.81);Difficulty in walking, not elsewhere classified (R26.2)     Time: 8559-8496 PT Time Calculation (min) (ACUTE ONLY): 23 min  Charges:    $Therapeutic Activity: 8-22 mins PT General Charges $$ ACUTE PT VISIT: 1 Visit                     Sherlean Lesches DPT, PT     Sherlean A Trula Frede 06/20/2024, 3:11 PM

## 2024-06-20 NOTE — Consult Note (Signed)
 Infectious Disease     Reason for Consult:Staph bacteremia    Referring Physician: Mcarthur Pick, MD  Date of Admission:  06/18/2024   Principal Problem:   Severe sepsis [facility]) Active Problems:   Benign prostatic hyperplasia   Essential hypertension   OSA (obstructive sleep apnea)   S/P aortic valve replacement   Status post coronary artery bypass graft   Hyperlipidemia LDL goal <70   GERD (gastroesophageal reflux disease)   Paroxysmal atrial fibrillation/A-flutter (HCC)   Uncontrolled type 2 diabetes mellitus with hyperglycemia, with long-term current use of insulin  (HCC)   CAP (community acquired pneumonia)   Sepsis secondary to UTI [facility])   Fall at home, initial encounter   HPI: Jonathan Cross. is a 78 y.o. male Complicated 78 year old gentleman admitted December 6 after a fall. 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 At admit it was  if he had loss of consciousness but he did have some head trauma.  He is found have a fever of 102.2 hypoxia in the field.  Brought to the emergency room where she was afebrile white count of 16.  And was elevated at 75.  Lactic acid was 2.1.  Urine sample greater than 50 white cells.  C. difficile testing was negative.  Blood cultures have grown 1 with staph lugdunensis and the other with Staph capitis.  Stool PCR was negative.  He was started on cefepime  metronidazole  and Vanco and then changed to cefazolin  azithromycin  and vancomycin . He has had imaging including MRI of the brain no acute stroke but prior strokes noted carotid Dopplers mild vascular sclerotic disease CT C-spine degenerative changes CT chest abdomen pelvis numerous patchy and hazy opacities  throughout both lungs compatible with widespread infectious or inflammatory process cholelithiasis without biliary ductal dilation left parapelvic renal cyst.  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 04/2022 Echo: EF 60-65%, GrII DD; b. 06/2022 RHC: PA 50/20(30), PCWP 25; c. 11/2022 Echo: EF 60-65%, mild LVH, GrI DD. mildly reduced RV fxn, mild MR, nl fxn'ing bioprosthetic AoV; d. 01/2023 Echo: EF 60-65%, nl fxn'ing AoV, prob mild AS. Nl RV fxn; e. 08/2023 Echo: EF 65-70%, no rwma, GrI DD, mildly reduced RV fxn, triv MR, Mild AS. Asc Ao 39mm.   Coronary artery disease    a. 01/2016 CABG x 2: LIMA->LAD, VG->OM; b. 05/2021 MV: small, mild, rev apical lateral and apical inf defects ->subtle ischemia vs artifact, EF 55-65%-->low risk; c. 06/2022 Cath: LM 20d, LAD 80ost, 60p/m, 63m, D1 mild dzs, LCX 50ost/p, OM1 mod dzs, RCA mild diff dzs, RPL1 75, VG->OM2 & LIMA->LAD patent--> Med Rx.   CVA, old, disturbances of vision    Diabetes (HCC)    H/O aortic valve replacement    a. 01/2016 s/p bioprosthetic AVR @ [facility] for Severe AS; b. 05/2021 Echo: EF 55-60%, nl fxn of bioprosthetic AVR; c. 04/2022 Echo: EF 60-65%, nl fxn'ing AoV; d. 11/2022 Echo: nl fxn'ing AoV; e. 01/2023 Echo: nl fxn'ing Aov, mean grad ; f. 08/2023 Echo: Mild AS (mean grad ).   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 CHA2DS2VASc = 5-->eliquis /amio; b. 01/2023 Amio d/c'd due to concern re: lung toxicity/ongoing dyspnea.   Retinal vascular occlusion of left eye    Several years ago   Sleep apnea    BiPAP   Vertigo    Wears dentures    full upper and lower.  Lower broken   Past Surgical History:  Procedure Laterality Date   AORTIC VALVE  REPLACEMENT  2017   [facility], bioprosthetic   APPENDECTOMY     BONE MARROW BIOPSY     05/2022   CARDIAC VALVE REPLACEMENT     CATARACT EXTRACTION W/PHACO Right 11/12/2022   Procedure: CATARACT EXTRACTION PHACO AND INTRAOCULAR LENS PLACEMENT (IOC) RIGHT DIABETIC MALYUGIN  13.49  01:06.1;  Surgeon: Mittie Gaskin, MD;  Location: [facility];  Service: Ophthalmology;  Laterality: Right;  Diabetic   CORONARY ARTERY BYPASS GRAFT  2017   [facility] - LIMA-LAD and SVG-OM   CYSTOSCOPY W/ RETROGRADES Right 05/27/2019   Procedure: CYSTOSCOPY WITH RETROGRADE PYELOGRAM;  Surgeon: Francisca Redell BROCKS, MD;  Location: [facility];  Service: Urology;  Laterality: Right;   CYSTOSCOPY W/ RETROGRADES Right 07/11/2019   Procedure: CYSTOSCOPY WITH RETROGRADE PYELOGRAM;  Surgeon: Francisca Redell BROCKS, MD;  Location: [facility];  Service: Urology;  Laterality: Right;   CYSTOSCOPY/URETEROSCOPY/HOLMIUM LASER/STENT PLACEMENT Right 05/27/2019   Procedure: CYSTOSCOPY/URETEROSCOPY/HOLMIUM LASER/STENT PLACEMENT;  Surgeon: Francisca Redell BROCKS, MD;  Location: [facility];  Service: Urology;  Laterality: Right;   CYSTOSCOPY/URETEROSCOPY/HOLMIUM LASER/STENT PLACEMENT Right 06/17/2019   Procedure: CYSTOSCOPY/URETEROSCOPY/HOLMIUM LASER/STENT Exchange;  Surgeon: Francisca Redell BROCKS, MD;  Location: [facility];  Service: Urology;  Laterality: Right;   CYSTOSCOPY/URETEROSCOPY/HOLMIUM LASER/STENT PLACEMENT Right 07/11/2019   Procedure: CYSTOSCOPY/URETEROSCOPY/HOLMIUM LASER/STENT Exchange;  Surgeon: Francisca Redell BROCKS, MD;  Location: [facility];  Service: Urology;  Laterality: Right;   RIGHT HEART CATH AND CORONARY/GRAFT ANGIOGRAPHY Bilateral 06/17/2022   Procedure: RIGHT HEART CATH AND CORONARY/GRAFT ANGIOGRAPHY;  Surgeon: Mady Bruckner, MD;  Location: [facility];  Service: Cardiovascular;  Laterality: Bilateral;   STONE EXTRACTION WITH BASKET Right 07/11/2019   Procedure: STONE EXTRACTION WITH BASKET;  Surgeon: Francisca Redell BROCKS, MD;  Location:  [facility];  Service: Urology;  Laterality: Right;   TEE WITHOUT CARDIOVERSION N/A 04/21/2019   Procedure: TRANSESOPHAGEAL ECHOCARDIOGRAM (TEE);  Surgeon: Perla Evalene PARAS, MD;  Location: [facility];  Service: Cardiovascular;  Laterality: N/A;   TEE WITHOUT CARDIOVERSION N/A 07/04/2019   Procedure: TRANSESOPHAGEAL ECHOCARDIOGRAM (TEE);  Surgeon: Darron Deatrice LABOR, MD;  Location: [facility];  Service: Cardiovascular;  Laterality: N/A;   TEE WITHOUT CARDIOVERSION N/A 08/17/2019   Procedure: TRANSESOPHAGEAL ECHOCARDIOGRAM (TEE);  Surgeon: Darliss Redell, MD;  Location: [facility];  Service: Cardiovascular;  Laterality: N/A;   TEE WITHOUT CARDIOVERSION N/A 11/15/2019   Procedure: TRANSESOPHAGEAL ECHOCARDIOGRAM (TEE);  Surgeon: Mady Bruckner, MD;  Location: [facility];  Service: Cardiovascular;  Laterality: N/A;   TONSILLECTOMY     Social History   Tobacco Use   Smoking status: Former    Current packs/day: 0.00    Types: Cigarettes    Quit date: 1970    Years since quitting: 55.9   Smokeless tobacco: Never  Vaping Use   Vaping status: Never Used  Substance Use Topics   Alcohol use: Never   Drug use: Never   Family History  Problem Relation Age of Onset   Heart attack Father 46  Heart attack Sister     Allergies:  Allergies  Allergen Reactions   Metformin Diarrhea and Anaphylaxis    Loose stools even with XR    Current antibiotics: Antibiotics Given (last 72 hours)     Date/Time Action Medication Dose Rate   06/18/24 0427 New Bag/Given   metroNIDAZOLE  (FLAGYL ) IVPB 500 mg 500 mg 100 mL/hr   06/18/24 0543 New Bag/Given   ceFEPIme  (MAXIPIME ) 2 g in sodium chloride  0.9 % 100 mL IVPB 2 g 200 mL/hr   06/18/24 9374 New Bag/Given   vancomycin  (VANCOCIN ) IVPB 1000 mg/200 mL premix 1,000 mg 200 mL/hr   06/18/24 9266 New Bag/Given   azithromycin  (ZITHROMAX ) 500 mg in sodium chloride  0.9 % 250 mL IVPB 500 mg 250 mL/hr   06/18/24 9078 New Bag/Given   vancomycin  (VANCOREADY) IVPB 1500  mg/300 mL 1,500 mg 150 mL/hr   06/18/24 1528 New Bag/Given   ceFEPIme  (MAXIPIME ) 2 g in sodium chloride  0.9 % 100 mL IVPB 2 g 200 mL/hr   06/18/24 2147 New Bag/Given   ceFEPIme  (MAXIPIME ) 2 g in sodium chloride  0.9 % 100 mL IVPB 2 g 200 mL/hr   06/19/24 0531 New Bag/Given   ceFAZolin  (ANCEF ) IVPB 2g/100 mL premix 2 g 200 mL/hr   06/19/24 1715 New Bag/Given   ceFAZolin  (ANCEF ) IVPB 2g/100 mL premix 2 g 200 mL/hr   06/19/24 2135 New Bag/Given   ceFAZolin  (ANCEF ) IVPB 2g/100 mL premix 2 g 200 mL/hr   06/20/24 0559 New Bag/Given   ceFAZolin  (ANCEF ) IVPB 2g/100 mL premix 2 g 200 mL/hr       MEDICATIONS:  enoxaparin  (LOVENOX ) injection  0.5 mg/kg Subcutaneous Q24H   furosemide   40 mg Oral BID   gabapentin   100 mg Oral TID   hydrOXYzine   10 mg Oral QHS   insulin  aspart  0-5 Units Subcutaneous QHS   insulin  aspart  0-9 Units Subcutaneous TID WC   insulin  aspart  3 Units Subcutaneous TID WC   insulin  glargine-yfgn  25 Units Subcutaneous Daily   metoprolol  succinate  12.5 mg Oral BID   nystatin  cream   Topical BID   ranolazine   500 mg Oral BID   rosuvastatin   40 mg Oral Daily   tamsulosin   0.4 mg Oral Daily    Review of Systems - 11 systems reviewed and negative per HPI  OBJECTIVE: Temp:  [97.7 F (36.5 C)-98.6 F (37 C)] 97.7 F (36.5 C) (12/08 1346) Pulse Rate:  [78-87] 78 (12/08 1346) Resp:  [12-20] 19 (12/08 1346) BP: (110-136)/(56-69) 121/60 (12/08 1346) SpO2:  [90 %-97 %] 97 % (12/08 1346) Physical Exam  Constitutional: He is sitting up in a chair HENT: anicteric Mouth/Throat: Oropharynx is clear and moist. No oropharyngeal exudate.  Cardiovascular: Normal rate, regular rhythm and normal heart sounds. 2/6 sm   Pulmonary/Chest: Effort normal and breath sounds normal. No respiratory distress. He has no wheezes.  Abdominal: Soft. Bowel sounds are normal. He exhibits no distension. There is no tenderness.  Lymphadenopathy:  He has no cervical adenopathy.  Neurological: He  is awake and interactive Skin: Skin is warm and dry. No rash noted. No erythema.  Psychiatric: He has a normal mood and affect. His behavior is normal.     LABS: Results for orders placed or performed during the hospital encounter of 06/18/24 (from the past 48 hours)  Glucose, capillary     Status: Abnormal   Collection Time: 06/18/24  2:47 PM  Result Value Ref Range   Glucose-Capillary 167 (H)  70 - 99 mg/dL    Comment: Glucose reference range applies only to samples taken after fasting for at least 8 hours.  Glucose, capillary     Status: Abnormal   Collection Time: 06/18/24  4:23 PM  Result Value Ref Range   Glucose-Capillary 213 (H) 70 - 99 mg/dL    Comment: Glucose reference range applies only to samples taken after fasting for at least 8 hours.   Comment 1 Notify RN    Comment 2 Document in Chart   Glucose, capillary     Status: Abnormal   Collection Time: 06/18/24  5:47 PM  Result Value Ref Range   Glucose-Capillary 206 (H) 70 - 99 mg/dL    Comment: Glucose reference range applies only to samples taken after fasting for at least 8 hours.  Glucose, capillary     Status: Abnormal   Collection Time: 06/18/24  8:06 PM  Result Value Ref Range   Glucose-Capillary 146 (H) 70 - 99 mg/dL    Comment: Glucose reference range applies only to samples taken after fasting for at least 8 hours.  Lipid panel     Status: Abnormal   Collection Time: 06/19/24  6:13 AM  Result Value Ref Range   Cholesterol 135 0 - 200 mg/dL    Comment:        ATP III CLASSIFICATION:  <200     mg/dL   Desirable  799-760  mg/dL   Borderline High  >=759    mg/dL   High           Triglycerides 187 (H) <150 mg/dL   HDL 17 (L) >59 mg/dL   Total CHOL/HDL Ratio 8.1 RATIO   VLDL 37 0 - 40 mg/dL   LDL Cholesterol 81 0 - 99 mg/dL    Comment:        Total Cholesterol/HDL:CHD Risk Coronary Heart Disease Risk Table                     Men   Women  1/2 Average Risk   3.4   3.3  Average Risk       5.0   4.4  2 X  Average Risk   9.6   7.1  3 X Average Risk  23.4   11.0        Use the calculated Patient Ratio above and the CHD Risk Table to determine the patient's CHD Risk.        ATP III CLASSIFICATION (LDL):  <100     mg/dL   Optimal  899-870  mg/dL   Near or Above                    Optimal  130-159  mg/dL   Borderline  839-810  mg/dL   High  >809     mg/dL   Very High Performed at [facility], [address] [place], [place] [address]   CBC     Status: Abnormal   Collection Time: 06/19/24  6:13 AM  Result Value Ref Range   WBC 9.6 4.0 - 10.5 K/uL   RBC 2.59 (L) 4.22 - 5.81 MIL/uL   Hemoglobin 9.3 (L) 13.0 - 17.0 g/dL   HCT 70.1 (L) 60.9 - 47.9 %   MCV 115.1 (H) 80.0 - 100.0 fL   MCH 35.9 (H) 26.0 - 34.0 pg   MCHC 31.2 30.0 - 36.0 g/dL   RDW 84.0 (H) 88.4 - 84.4 %   Platelets 131 (  L) 150 - 400 K/uL   nRBC 0.0 0.0 - 0.2 %    Comment: Performed at [facility], [address] [place], [place] [address]  Magnesium      Status: None   Collection Time: 06/19/24  6:13 AM  Result Value Ref Range   Magnesium  1.8 1.7 - 2.4 mg/dL    Comment: Performed at [facility], [address] [place], [place] [address]  Glucose, capillary     Status: Abnormal   Collection Time: 06/19/24  8:04 AM  Result Value Ref Range   Glucose-Capillary 142 (H) 70 - 99 mg/dL    Comment: Glucose reference range applies only to samples taken after fasting for at least 8 hours.  Gastrointestinal Panel by PCR , Stool     Status: None   Collection Time: 06/19/24 12:53 PM   Specimen: Stool  Result Value Ref Range   Campylobacter species NOT DETECTED NOT DETECTED   Plesimonas shigelloides NOT DETECTED NOT DETECTED   Salmonella species NOT DETECTED NOT DETECTED   Yersinia enterocolitica NOT DETECTED NOT DETECTED   Vibrio species NOT DETECTED NOT DETECTED   Vibrio cholerae NOT DETECTED NOT DETECTED   Enteroaggregative E coli (EAEC) NOT DETECTED NOT DETECTED   Enteropathogenic E coli  (EPEC) NOT DETECTED NOT DETECTED   Enterotoxigenic E coli (ETEC) NOT DETECTED NOT DETECTED   Shiga like toxin producing E coli (STEC) NOT DETECTED NOT DETECTED   Shigella/Enteroinvasive E coli (EIEC) NOT DETECTED NOT DETECTED   Cryptosporidium NOT DETECTED NOT DETECTED   Cyclospora cayetanensis NOT DETECTED NOT DETECTED   Entamoeba histolytica NOT DETECTED NOT DETECTED   Giardia lamblia NOT DETECTED NOT DETECTED   Adenovirus F40/41 NOT DETECTED NOT DETECTED   Astrovirus NOT DETECTED NOT DETECTED   Norovirus GI/GII NOT DETECTED NOT DETECTED   Rotavirus A NOT DETECTED NOT DETECTED   Sapovirus (I, II, IV, and V) NOT DETECTED NOT DETECTED    Comment: Performed at [facility], [address] [place], [place] [address]  Glucose, capillary     Status: Abnormal   Collection Time: 06/19/24 12:56 PM  Result Value Ref Range   Glucose-Capillary 199 (H) 70 - 99 mg/dL    Comment: Glucose reference range applies only to samples taken after fasting for at least 8 hours.  Basic metabolic panel with GFR     Status: Abnormal   Collection Time: 06/19/24  2:38 PM  Result Value Ref Range   Sodium 134 (L) 135 - 145 mmol/L   Potassium 3.5 3.5 - 5.1 mmol/L   Chloride 101 98 - 111 mmol/L   CO2 22 22 - 32 mmol/L   Glucose, Bld 210 (H) 70 - 99 mg/dL    Comment: Glucose reference range applies only to samples taken after fasting for at least 8 hours.   BUN 16 8 - 23 mg/dL   Creatinine, Ser 8.90 0.61 - 1.24 mg/dL   Calcium  8.2 (L) 8.9 - 10.3 mg/dL   GFR, Estimated >39 >39 mL/min    Comment: (NOTE) Calculated using the CKD-EPI Creatinine Equation (2021)    Anion gap 11 5 - 15    Comment: Performed at [facility], [address] [place], [place] [address]  Glucose, capillary     Status: Abnormal   Collection Time: 06/19/24  4:29 PM  Result Value Ref Range   Glucose-Capillary 184 (H) 70 - 99 mg/dL    Comment: Glucose reference range applies only to samples taken after fasting for at  least  8 hours.  Glucose, capillary     Status: Abnormal   Collection Time: 06/19/24  8:39 PM  Result Value Ref Range   Glucose-Capillary 252 (H) 70 - 99 mg/dL    Comment: Glucose reference range applies only to samples taken after fasting for at least 8 hours.  CBC     Status: Abnormal   Collection Time: 06/20/24  5:30 AM  Result Value Ref Range   WBC 5.1 4.0 - 10.5 K/uL   RBC 2.53 (L) 4.22 - 5.81 MIL/uL   Hemoglobin 9.2 (L) 13.0 - 17.0 g/dL   HCT 70.8 (L) 60.9 - 47.9 %   MCV 115.0 (H) 80.0 - 100.0 fL   MCH 36.4 (H) 26.0 - 34.0 pg   MCHC 31.6 30.0 - 36.0 g/dL   RDW 84.1 (H) 88.4 - 84.4 %   Platelets 136 (L) 150 - 400 K/uL   nRBC 0.0 0.0 - 0.2 %    Comment: Performed at [facility], [address] [place], [place] [address]  Basic metabolic panel with GFR     Status: Abnormal   Collection Time: 06/20/24  5:30 AM  Result Value Ref Range   Sodium 139 135 - 145 mmol/L   Potassium 3.4 (L) 3.5 - 5.1 mmol/L   Chloride 103 98 - 111 mmol/L   CO2 25 22 - 32 mmol/L   Glucose, Bld 137 (H) 70 - 99 mg/dL    Comment: Glucose reference range applies only to samples taken after fasting for at least 8 hours.   BUN 16 8 - 23 mg/dL   Creatinine, Ser 8.95 0.61 - 1.24 mg/dL   Calcium  8.5 (L) 8.9 - 10.3 mg/dL   GFR, Estimated >39 >39 mL/min    Comment: (NOTE) Calculated using the CKD-EPI Creatinine Equation (2021)    Anion gap 10 5 - 15    Comment: Performed at [facility], [address] [place], [place] [address]  Culture, blood (Routine X 2) w Reflex to ID Panel     Status: None (Preliminary result)   Collection Time: 06/20/24  5:36 AM   Specimen: BLOOD  Result Value Ref Range   Specimen Description BLOOD BLOOD RIGHT HAND    Special Requests      BOTTLES DRAWN AEROBIC AND ANAEROBIC Blood Culture adequate volume   Culture      NO GROWTH < 12 HOURS Performed at [facility], [address] [place], [place] [address]    Report Status PENDING   Glucose,  capillary     Status: Abnormal   Collection Time: 06/20/24  8:39 AM  Result Value Ref Range   Glucose-Capillary 143 (H) 70 - 99 mg/dL    Comment: Glucose reference range applies only to samples taken after fasting for at least 8 hours.  Glucose, capillary     Status: Abnormal   Collection Time: 06/20/24 11:46 AM  Result Value Ref Range   Glucose-Capillary 186 (H) 70 - 99 mg/dL    Comment: Glucose reference range applies only to samples taken after fasting for at least 8 hours.   No components found for: ESR, C REACTIVE PROTEIN MICRO: Recent Results (from the past 720 hours)  Blood Culture (routine x 2)     Status: Abnormal (Preliminary result)   Collection Time: 06/18/24  3:58 AM   Specimen: BLOOD  Result Value Ref Range Status   Specimen Description   Final    BLOOD BLOOD LEFT ARM Performed at [facility], [address] [place], [place] [address]    Special Requests   Final    BOTTLES DRAWN AEROBIC AND ANAEROBIC Blood Culture adequate volume Performed at [facility], [address] [place], [place] [address]    Culture  Setup Time   Final    GRAM POSITIVE COCCI AEROBIC BOTTLE ONLY CRITICAL VALUE NOTED.  VALUE IS CONSISTENT WITH PREVIOUSLY REPORTED AND CALLED VALUE. Performed at [facility], [address] [place], [place] [address]    Culture (A)  Final    STAPHYLOCOCCUS CAPITIS THE SIGNIFICANCE OF ISOLATING THIS ORGANISM FROM A SINGLE SET OF BLOOD CULTURES WHEN MULTIPLE SETS ARE DRAWN IS UNCERTAIN. PLEASE NOTIFY THE MICROBIOLOGY DEPARTMENT WITHIN ONE WEEK IF SPECIATION AND SENSITIVITIES ARE REQUIRED. CULTURE REINCUBATED FOR BETTER GROWTH Performed at [facility], [address] [place], [place] [address]    Report Status PENDING  Incomplete  Blood Culture (routine x 2)     Status: Abnormal (Preliminary result)   Collection Time: 06/18/24  3:58 AM   Specimen: BLOOD  Result Value Ref Range Status   Specimen Description    Final    BLOOD BLOOD RIGHT ARM Performed at [facility], [address] [place], [place] [address]    Special Requests   Final    BOTTLES DRAWN AEROBIC AND ANAEROBIC Blood Culture adequate volume Performed at [facility], [address] [place], [place] [address]    Culture  Setup Time   Final    GRAM POSITIVE COCCI IN BOTH AEROBIC AND ANAEROBIC BOTTLES CRITICAL RESULT CALLED TO, READ BACK BY AND VERIFIED WITH: JASON ROBBINS, PHARMD @0411  06/19/2024 COP    Culture (A)  Final    STAPHYLOCOCCUS LUGDUNENSIS SUSCEPTIBILITIES TO FOLLOW CULTURE REINCUBATED FOR BETTER GROWTH Performed at [facility], [address], [place], [place] [address]    Report Status PENDING  Incomplete  Resp panel by RT-PCR (RSV, Flu A&B, Covid) Urine, Clean Catch     Status: None   Collection Time: 06/18/24  3:58 AM   Specimen: Urine, Clean Catch; Nasal Swab  Result Value Ref Range Status   SARS Coronavirus 2 by RT PCR NEGATIVE NEGATIVE Final    Comment: (NOTE) SARS-CoV-2 target nucleic acids are NOT DETECTED.  The SARS-CoV-2 RNA is generally detectable in upper respiratory specimens during the acute phase of infection. The lowest concentration of SARS-CoV-2 viral copies this assay can detect is 138 copies/mL. A negative result does not preclude SARS-Cov-2 infection and should not be used as the sole basis for treatment or other patient management decisions. A negative result may occur with  improper specimen collection/handling, submission of specimen other than nasopharyngeal swab, presence of viral mutation(s) within the areas targeted by this assay, and inadequate number of viral copies(<138 copies/mL). A negative result must be combined with clinical observations, patient history, and epidemiological information. The expected result is Negative.  Fact Sheet for Patients:  bloggercourse.com  Fact Sheet for Healthcare Providers:   seriousbroker.it  This test is no t yet approved or cleared by the [place]  FDA and  has been authorized for detection and/or diagnosis of SARS-CoV-2 by FDA under an Emergency Use Authorization (EUA). This EUA will remain  in effect (meaning this test can be used) for the duration of the COVID-19 declaration under Section 564(b)(1) of the Act, 21 U.S.C.section 360bbb-3(b)(1), unless the authorization is terminated  or revoked sooner.       Influenza A by PCR NEGATIVE NEGATIVE Final   Influenza B by  PCR NEGATIVE NEGATIVE Final    Comment: (NOTE) The Xpert Xpress SARS-CoV-2/FLU/RSV plus assay is intended as an aid in the diagnosis of influenza from Nasopharyngeal swab specimens and should not be used as a sole basis for treatment. Nasal washings and aspirates are unacceptable for Xpert Xpress SARS-CoV-2/FLU/RSV testing.  Fact Sheet for Patients: bloggercourse.com  Fact Sheet for Healthcare Providers: seriousbroker.it  This test is not yet approved or cleared by the [place]  FDA and has been authorized for detection and/or diagnosis of SARS-CoV-2 by FDA under an Emergency Use Authorization (EUA). This EUA will remain in effect (meaning this test can be used) for the duration of the COVID-19 declaration under Section 564(b)(1) of the Act, 21 U.S.C. section 360bbb-3(b)(1), unless the authorization is terminated or revoked.     Resp Syncytial Virus by PCR NEGATIVE NEGATIVE Final    Comment: (NOTE) Fact Sheet for Patients: bloggercourse.com  Fact Sheet for Healthcare Providers: seriousbroker.it  This test is not yet approved or cleared by the [place]  FDA and has been authorized for detection and/or diagnosis of SARS-CoV-2 by FDA under an Emergency Use Authorization (EUA). This EUA will remain in effect (meaning this test can be used) for  the duration of the COVID-19 declaration under Section 564(b)(1) of the Act, 21 U.S.C. section 360bbb-3(b)(1), unless the authorization is terminated or revoked.  Performed at [facility], [address] [place], [place] [address]   Blood Culture ID Panel (Reflexed)     Status: Abnormal   Collection Time: 06/18/24  3:58 AM  Result Value Ref Range Status   Enterococcus faecalis NOT DETECTED NOT DETECTED Final   Enterococcus Faecium NOT DETECTED NOT DETECTED Final   Listeria monocytogenes NOT DETECTED NOT DETECTED Final   Staphylococcus species DETECTED (A) NOT DETECTED Final    Comment: CRITICAL RESULT CALLED TO, READ BACK BY AND VERIFIED WITH: JASON ROBBINS, PHARMD @0411  06/19/2024 COP    Staphylococcus aureus (BCID) NOT DETECTED NOT DETECTED Final   Staphylococcus epidermidis DETECTED (A) NOT DETECTED Final    Comment: CRITICAL RESULT CALLED TO, READ BACK BY AND VERIFIED WITH: JASON ROBBINS, PHARMD @0411  06/19/2024 COP    Staphylococcus lugdunensis NOT DETECTED NOT DETECTED Final   Streptococcus species NOT DETECTED NOT DETECTED Final   Streptococcus agalactiae NOT DETECTED NOT DETECTED Final   Streptococcus pneumoniae NOT DETECTED NOT DETECTED Final   Streptococcus pyogenes NOT DETECTED NOT DETECTED Final   A.calcoaceticus-baumannii NOT DETECTED NOT DETECTED Final   Bacteroides fragilis NOT DETECTED NOT DETECTED Final   Enterobacterales NOT DETECTED NOT DETECTED Final   Enterobacter cloacae complex NOT DETECTED NOT DETECTED Final   Escherichia coli NOT DETECTED NOT DETECTED Final   Klebsiella aerogenes NOT DETECTED NOT DETECTED Final   Klebsiella oxytoca NOT DETECTED NOT DETECTED Final   Klebsiella pneumoniae NOT DETECTED NOT DETECTED Final   Proteus species NOT DETECTED NOT DETECTED Final   Salmonella species NOT DETECTED NOT DETECTED Final   Serratia marcescens NOT DETECTED NOT DETECTED Final   Haemophilus influenzae NOT DETECTED NOT DETECTED Final   Neisseria  meningitidis NOT DETECTED NOT DETECTED Final   Pseudomonas aeruginosa NOT DETECTED NOT DETECTED Final   Stenotrophomonas maltophilia NOT DETECTED NOT DETECTED Final   Candida albicans NOT DETECTED NOT DETECTED Final   Candida auris NOT DETECTED NOT DETECTED Final   Candida glabrata NOT DETECTED NOT DETECTED Final   Candida krusei NOT DETECTED NOT DETECTED Final   Candida parapsilosis NOT DETECTED NOT DETECTED Final   Candida tropicalis NOT DETECTED NOT DETECTED  Final   Cryptococcus neoformans/gattii NOT DETECTED NOT DETECTED Final   Methicillin resistance mecA/C NOT DETECTED NOT DETECTED Final    Comment: Performed at [facility], [address] [place], [place] [address]  C Difficile Quick Screen w PCR reflex     Status: None   Collection Time: 06/18/24 12:53 PM   Specimen: STOOL  Result Value Ref Range Status   C Diff antigen NEGATIVE NEGATIVE Final   C Diff toxin NEGATIVE NEGATIVE Final   C Diff interpretation No C. difficile detected.  Final    Comment: Performed at [facility], [address] [place], [place] [address]  Gastrointestinal Panel by PCR , Stool     Status: None   Collection Time: 06/19/24 12:53 PM   Specimen: Stool  Result Value Ref Range Status   Campylobacter species NOT DETECTED NOT DETECTED Final   Plesimonas shigelloides NOT DETECTED NOT DETECTED Final   Salmonella species NOT DETECTED NOT DETECTED Final   Yersinia enterocolitica NOT DETECTED NOT DETECTED Final   Vibrio species NOT DETECTED NOT DETECTED Final   Vibrio cholerae NOT DETECTED NOT DETECTED Final   Enteroaggregative E coli (EAEC) NOT DETECTED NOT DETECTED Final   Enteropathogenic E coli (EPEC) NOT DETECTED NOT DETECTED Final   Enterotoxigenic E coli (ETEC) NOT DETECTED NOT DETECTED Final   Shiga like toxin producing E coli (STEC) NOT DETECTED NOT DETECTED Final   Shigella/Enteroinvasive E coli (EIEC) NOT DETECTED NOT DETECTED Final   Cryptosporidium NOT DETECTED NOT  DETECTED Final   Cyclospora cayetanensis NOT DETECTED NOT DETECTED Final   Entamoeba histolytica NOT DETECTED NOT DETECTED Final   Giardia lamblia NOT DETECTED NOT DETECTED Final   Adenovirus F40/41 NOT DETECTED NOT DETECTED Final   Astrovirus NOT DETECTED NOT DETECTED Final   Norovirus GI/GII NOT DETECTED NOT DETECTED Final   Rotavirus A NOT DETECTED NOT DETECTED Final   Sapovirus (I, II, IV, and V) NOT DETECTED NOT DETECTED Final    Comment: Performed at [facility], [address] [place], [place] [address]  Culture, blood (Routine X 2) w Reflex to ID Panel     Status: None (Preliminary result)   Collection Time: 06/20/24  5:36 AM   Specimen: BLOOD  Result Value Ref Range Status   Specimen Description BLOOD BLOOD RIGHT HAND  Final   Special Requests   Final    BOTTLES DRAWN AEROBIC AND ANAEROBIC Blood Culture adequate volume   Culture   Final    NO GROWTH < 12 HOURS Performed at [facility], [address] [place], [place] [address]    Report Status PENDING  Incomplete    IMAGING: US  Carotid Bilateral (at [facility] and [facility] only) Result Date: 06/19/2024 CLINICAL DATA:  Transient ischemic attack EXAM: BILATERAL CAROTID DUPLEX ULTRASOUND TECHNIQUE: Elnor scale imaging, color Doppler and duplex ultrasound were performed of bilateral carotid and vertebral arteries in the neck. COMPARISON:  None Available. FINDINGS: Criteria: Quantification of carotid stenosis is based on velocity parameters that correlate the residual internal carotid diameter with NASCET-based stenosis levels, using the diameter of the distal internal carotid lumen as the denominator for stenosis measurement. The following velocity measurements were obtained: RIGHT ICA: 102 cm/sec CCA: 114 cm/sec SYSTOLIC ICA/CCA RATIO:  1.1 ECA: 130 cm/sec LEFT ICA: 98 cm/sec CCA: 104 cm/sec SYSTOLIC ICA/CCA RATIO:  1.1 ECA: 139 cm/sec RIGHT CAROTID ARTERY: Mild diffuse atherosclerotic changes. There is a single focus of  mild velocity elevation in the distal ICA without clear stenotic lesion  at that site. RIGHT VERTEBRAL ARTERY:  Antegrade. LEFT CAROTID ARTERY:  Mild diffuse atherosclerotic changes. LEFT VERTEBRAL ARTERY:  Antegrade. IMPRESSION: 1. Right carotid system: Mild atherosclerotic changes with estimated stenosis measuring less than 50%. 2. Left carotid system: Mild atherosclerotic changes with estimated stenosis measuring less than 50%. Electronically Signed   By: Maude Naegeli M.D.   On: 06/19/2024 05:57   ECHOCARDIOGRAM COMPLETE Result Date: 06/18/2024    ECHOCARDIOGRAM REPORT   Patient Name:   Jadd Gasior [facility]. Date of Exam: 06/18/2024 Medical Rec #:  [id number]              Height:       72.0 in Accession #:    [id number]             Weight:       260.0 lb Date of Birth:  Dec 06, 1945              BSA:          2.382 m Patient Age:    78 years               BP:           144/67 mmHg Patient Gender: M                      HR:           93 bpm. Exam Location:  [facility] Procedure: 2D Echo, Cardiac Doppler, Color Doppler and Intracardiac            Opacification Agent (Both Spectral and Color Flow Doppler were            utilized during procedure). Indications:     Stroke I63.9  History:         Patient has prior history of Echocardiogram examinations, most                  recent 08/25/2023.                  Aortic Valve: 23 mm bioprosthetic valve is present in the                  aortic position. Procedure Date: 02/11/2016.  Sonographer:     Thedora Louder RDCS, FASE Referring Phys:  [id number] [facility] PAUDEL Diagnosing Phys: Darryle Decent MD  Sonographer Comments: Technically difficult study due to poor echo windows and patient is obese. Image acquisition challenging due to patient body habitus. IMPRESSIONS  1. 23 mm pericardial bioprosthetic AoV. Vmax 3.1 m/s, MG 19 mmHG. EOA and DI are inaccurate due to poor LVOT PW interrogation. 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 Gradients have  increased from prior study. Consider TEE or cardiac CTA for further interrogation. The aortic valve has been repaired/replaced. Aortic valve regurgitation is not visualized. There is a 23 mm bioprosthetic valve present in the aortic position. Procedure Date: 02/11/2016. 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 Aortic valve area, by VTI measures 1.52 cm. Aortic valve mean gradient measures 19.0 mmHg. Aortic valve Vmax measures 3.09 m/s.  2. Left ventricular ejection fraction, by estimation, is 60 to 65%. The left ventricle has normal function. The left ventricle has no regional wall motion abnormalities. Left ventricular diastolic parameters are consistent with Grade II diastolic dysfunction (pseudonormalization).  3. Right ventricular systolic function is normal. The right ventricular size is normal. Tricuspid regurgitation signal is inadequate for assessing PA pressure.  4. The mitral valve is grossly normal. Trivial mitral valve regurgitation. No evidence of mitral stenosis. Comparison(s): Changes from prior study are noted. 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 FINDINGS  Left Ventricle: Left ventricular ejection fraction, by estimation, is 60 to 65%. The left ventricle has normal function. The left ventricle has no regional wall motion abnormalities. Definity  contrast agent was given IV to delineate the left ventricular  endocardial borders. The left ventricular internal cavity size was normal in size. There is no left ventricular hypertrophy. Abnormal (paradoxical) septal motion consistent with post-operative status. Left ventricular diastolic parameters are consistent  with Grade II diastolic dysfunction (pseudonormalization). Right Ventricle: The right ventricular size is normal. No increase in right ventricular wall thickness. Right ventricular systolic function is normal. Tricuspid regurgitation signal is inadequate for assessing PA pressure. Left Atrium: Left atrial  size was normal in size. Right Atrium: Right atrial size was normal in size. Pericardium: There is no evidence of pericardial effusion. Mitral Valve: The mitral valve is grossly normal. Trivial mitral valve regurgitation. No evidence of mitral valve stenosis. Tricuspid Valve: The tricuspid valve is grossly normal. Tricuspid valve regurgitation is trivial. No evidence of tricuspid stenosis. Aortic Valve: 23 mm pericardial bioprosthetic AoV. Vmax 3.1 m/s, MG 19 mmHG. EOA and DI are inaccurate due to poor LVOT PW interrogation. 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 Gradients have increased from prior study. Consider TEE or cardiac CTA for further interrogation. The aortic valve has been repaired/replaced. Aortic valve regurgitation is not visualized. Aortic valve mean gradient measures 19.0 mmHg. Aortic valve peak gradient measures 38.2 mmHg. Aortic valve area, by VTI measures 1.52 cm. There is a 23 mm bioprosthetic valve present in the aortic position. Procedure Date: 02/11/2016. Pulmonic Valve: The pulmonic valve was grossly normal. Pulmonic valve regurgitation is trivial. No evidence of pulmonic stenosis. Aorta: The aortic root and ascending aorta are structurally normal, with no evidence of dilitation. Venous: The right lower pulmonary vein is normal. The inferior vena cava was not well visualized. IAS/Shunts: The atrial septum is grossly normal.  LEFT VENTRICLE PLAX 2D LVIDd:         5.10 cm   Diastology LVIDs:         3.40 cm   LV e' medial:    3.81 cm/s LV PW:         1.10 cm   LV E/e' medial:  36.2 LV IVS:        1.00 cm   LV e' lateral:   11.60 cm/s LVOT diam:     1.90 cm   LV E/e' lateral: 11.9 LV SV:         89 LV SV Index:   37 LVOT Area:     2.84 cm  RIGHT VENTRICLE RV Basal diam:  2.80 cm RV S prime:     7.62 cm/s TAPSE (M-mode): 1.3 cm LEFT ATRIUM           Index        RIGHT ATRIUM           Index LA diam:      4.40 cm 1.85 cm/m   RA Area:     11.80 cm LA Vol (A2C):  29.8 ml 12.51 ml/m  RA Volume:   27.20 ml  11.42 ml/m LA Vol (A4C): 39.6 ml 16.63 ml/m  AORTIC VALVE                     PULMONIC VALVE AV  Area (Vmax):    1.39 cm      PV Vmax:        0.96 m/s AV Area (Vmean):   1.44 cm      PV Peak grad:   3.7 mmHg AV Area (VTI):     1.52 cm      RVOT Peak grad: 3 mmHg AV Vmax:           309.00 cm/s AV Vmean:          197.000 cm/s AV VTI:            0.586 m AV Peak Grad:      38.2 mmHg AV Mean Grad:      19.0 mmHg LVOT Vmax:         152.00 cm/s LVOT Vmean:        100.000 cm/s LVOT VTI:          0.314 m LVOT/AV VTI ratio: 0.54  AORTA Ao Root diam: 3.20 cm Ao Asc diam:  3.00 cm MITRAL VALVE MV Area (PHT): 6.37 cm     SHUNTS MV Decel Time: 119 msec     Systemic VTI:  0.31 m MV E velocity: 138.00 cm/s  Systemic Diam: 1.90 cm MV A velocity: 142.00 cm/s MV E/A ratio:  0.97 Darryle Decent MD Electronically signed by Darryle Decent MD Signature Date/Time: 06/18/2024/7:35:20 PM    Final    MR BRAIN WO CONTRAST Result Date: 06/18/2024 EXAM: MRI Brain Without Contrast 06/18/2024 07:02:00 PM TECHNIQUE: Multiplanar multisequence MRI of the head/brain was performed without the administration of intravenous contrast. COMPARISON: None available. CLINICAL HISTORY: Neuro deficit, acute, stroke suspected FINDINGS: BRAIN AND VENTRICLES: No acute infarct. No acute intracranial hemorrhage. A few chronic microhemorrhages. No mass. No midline shift. No hydrocephalus. 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 Normal flow voids. ORBITS: No acute abnormality. SINUSES AND MASTOIDS: No acute abnormality. BONES AND SOFT TISSUES: Normal marrow signal. No acute soft tissue abnormality. IMPRESSION: 1. No acute intracranial abnormality. 2. Remote left parietal and right occipital infarcts. Electronically signed by: Gilmore Molt MD 06/18/2024 07:31 PM EST RP Workstation: [id number]   CT CHEST ABDOMEN PELVIS W CONTRAST Result Date: 06/18/2024 EXAM: CT  CHEST, ABDOMEN AND PELVIS WITH CONTRAST 06/18/2024 05:30:52 AM TECHNIQUE: CT of the chest, abdomen and pelvis was performed with the administration of 100 mL of iohexol  (OMNIPAQUE ) 350 MG/ML injection. Multiplanar reformatted images are provided for review. Automated exposure control, iterative reconstruction, and/or weight based adjustment of the mA/kV was utilized to reduce the radiation dose to as low as reasonably achievable. COMPARISON: CT angiogram of the chest dated 06/01/2023 and CT of the abdomen and pelvis dated 12/08/2022. CLINICAL HISTORY: Sepsis; R abd tenderness, cough, sepsis. FINDINGS: CHEST: MEDIASTINUM AND LYMPH NODES: Heart demonstrates moderate calcific coronary artery disease and moderate aortic valvular disease. The thoracic aorta demonstrates moderate calcific atheromatous disease. There are numerous shotty mediastinal lymph nodes. The patient is status post sternotomy for aortic valve repair. The central airways are clear. No hilar or axillary lymphadenopathy. LUNGS AND PLEURA: There are numerous patchy and hazy opacities scattered throughout both lung zones suggestive of widespread infectious or inflammatory process. No pleural effusion or pneumothorax. ABDOMEN AND PELVIS: LIVER: The liver is unremarkable. GALLBLADDER AND BILE DUCTS: There are stones laying dependent within the gallbladder. No biliary ductal dilatation. SPLEEN: No acute abnormality. PANCREAS: No acute abnormality. ADRENAL GLANDS: No acute abnormality. KIDNEYS, URETERS AND BLADDER: There is a 6.5 cm left parapelvic renal cyst.  Small simple cysts are also present within the right kidney. Per consensus, no follow-up is needed for simple Bosniak type 1 and 2 renal cysts, unless the patient has a malignancy history or risk factors. No stones in the kidneys or ureters. No hydronephrosis. No perinephric or periureteral stranding. Urinary bladder is unremarkable. GI AND BOWEL: Stomach demonstrates no acute abnormality. There is no  bowel obstruction. REPRODUCTIVE ORGANS: No acute abnormality. PERITONEUM AND RETROPERITONEUM: No ascites. No free air. VASCULATURE: The abdominal aorta is normal in caliber and demonstrates moderate calcific atheromatous disease. ABDOMINAL AND PELVIS LYMPH NODES: No lymphadenopathy. BONES AND SOFT TISSUES: No acute osseous abnormality. No focal soft tissue abnormality. IMPRESSION: 1. Numerous patchy and hazy opacities throughout both lungs compatible with widespread infectious or inflammatory process. No pleural effusion or pneumothorax. Correlate clinically for infection. Recommend short-term radiographic or CT follow-up as clinically indicated to document resolution. 2. Cholelithiasis without biliary ductal dilatation. 3. Left parapelvic renal cyst measuring 6.5 cm and additional small simple right renal cysts; no follow up indicated. 4. Atherosclerotic calcifications involving the coronary arteries, aortic valve, thoracic aorta, and abdominal aorta. Status post median sternotomy for aortic valve repair. Electronically signed by: Evalene Coho MD 06/18/2024 05:53 AM EST RP Workstation: [id number]   CT Cervical Spine Wo Contrast Result Date: 06/18/2024 EXAM: CT CERVICAL SPINE WITHOUT CONTRAST 06/18/2024 05:30:52 AM TECHNIQUE: CT of the cervical spine was performed without the administration of intravenous contrast. Multiplanar reformatted images are provided for review. Automated exposure control, iterative reconstruction, and/or weight based adjustment of the mA/kV was utilized to reduce the radiation dose to as low as reasonably achievable. COMPARISON: None available. CLINICAL HISTORY: Neck trauma (Age >= 65y) FINDINGS: CERVICAL SPINE: BONES AND ALIGNMENT: There is no evidence of fracture or acute traumatic injury. There is straightening of the normal cervical lordosis. DEGENERATIVE CHANGES: There is multilevel chronic degenerative disc disease and facet arthropathy. The left facets at C2-C3 and C4-C5 are  fused. There is a posterior disc osteophyte complex present at C3-C4 causing mild-to-moderate central spinal canal stenosis and moderate left neural foraminal stenosis. There is moderate left facet arthrosis at C4-C5 secondary to facet hypertrophic changes. There is mild-to-moderate bilateral neural foraminal stenosis at C5-C6 and moderate bilateral neural foraminal stenosis and central spinal canal stenosis at C6-C7. SOFT TISSUES: No prevertebral soft tissue swelling. Calcifications within the carotid bulbs bilaterally. IMPRESSION: 1. No acute abnormality of the cervical spine related to the provided clinical history of neck trauma. 2. Multilevel chronic degenerative changes of the cervical spine as detailed, including mild-to-moderate central canal stenosis at C3-4 and C6-7, and multilevel neural foraminal stenoses. 3. Carotid bulb atherosclerotic calcifications bilaterally; consider correlation with cardiovascular risk factors and management as clinically indicated. Electronically signed by: Evalene Coho MD 06/18/2024 05:46 AM EST RP Workstation: [id number]   CT Head Wo Contrast Result Date: 06/18/2024 EXAM: CT HEAD WITHOUT CONTRAST 06/18/2024 05:30:52 AM TECHNIQUE: CT of the head was performed without the administration of intravenous contrast. Automated exposure control, iterative reconstruction, and/or weight based adjustment of the mA/kV was utilized to reduce the radiation dose to as low as reasonably achievable. COMPARISON: 06/01/2023 CLINICAL HISTORY: Head trauma, minor (Age >= 65y) FINDINGS: BRAIN AND VENTRICLES: No acute hemorrhage. Remote left parietal infarct. New age indeterminate right occipital lobe infarct. Moderate chronic microvascular ischemic change. No hydrocephalus. No extra-axial collection. No mass effect or midline shift. ORBITS: Right lens replacement. SINUSES: No acute abnormality. SOFT TISSUES AND SKULL: No acute soft tissue abnormality. No skull fracture. Partial left mastoid  effusion.  IMPRESSION: 1. No acute intracranial abnormality related to the head trauma. 2. New age indeterminate right occipital lobe infarct. 3. Remote left parietal infarct. 4. Moderate chronic microvascular ischemic change. Electronically signed by: Evalene Coho MD 06/18/2024 05:41 AM EST RP Workstation: [id number]   DG Chest Port 1 View Result Date: 06/18/2024 EXAM: 1 VIEW(S) XRAY OF THE CHEST 06/18/2024 04:01:00 AM COMPARISON: 12/09/2023 CLINICAL HISTORY: Questionable sepsis - evaluate for abnormality FINDINGS: LUNGS AND PLEURA: Patchy airspace infiltrates within the left perihilar and retrocardiac regions, possibly infectious in nature. No pleural effusion. No pneumothorax. HEART AND MEDIASTINUM: CABG changes. Aortic atherosclerosis. No acute abnormality of the cardiac and mediastinal silhouettes. BONES AND SOFT TISSUES: Intact sternotomy wires. No acute osseous abnormality. IMPRESSION: 1. Patchy left perihilar and retrocardiac airspace infiltrates, suspicious for pneumonia. Electronically signed by: Dorethia Molt MD 06/18/2024 04:13 AM EST RP Workstation: [id number]    Assessment:   Jonathan Cross. is a 78 y.o. male with multiple medical issues admitted after a fall and found to have UTI and mixed gram positive bacteremia.   Recommendations Possible UTI- UA > 50 wbc. UCX pending Place on ceftriaxone  for now.  Mixed gram positive bacteremia - staph capitis and staph lugdeneiss on 2 different cxs and biofire with staph epi. Wll await final cultures. Possible this is all contaminant.  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 Please call with questions.   Alm SQUIBB. Epifanio, MD

## 2024-06-20 NOTE — NC FL2 (Signed)
 [place]  MEDICAID FL2 LEVEL OF CARE FORM     IDENTIFICATION  Patient Name: Jonathan Cross. Birthdate: [birth year] Sex: male Admission Date (Current Location): 06/18/2024  [facility] and [place] Number:  Chiropodist and Address:  [facility], [address], [place], [place] [address]      Provider Number: [id number]  Attending Physician Name and Address:  Mcarthur Pick, MD  Relative Name and Phone Number:       Current Level of Care: Hospital Recommended Level of Care: Skilled Nursing Facility Prior Approval Number:    Date Approved/Denied:   PASRR Number: [id number]  Discharge Plan: SNF    Current Diagnoses: Patient Active Problem List   Diagnosis Date Noted   Fall at home, initial encounter 06/18/2024   Sepsis secondary to UTI (HCC) 12/09/2023   Obesity, Class III, BMI 40-49.9 (morbid obesity) (HCC) 12/09/2023   Chest pain 08/24/2023   Iron  deficiency anemia 08/06/2023   Angina at rest 06/01/2023   CAP (community acquired pneumonia) 06/01/2023   UTI (urinary tract infection) 06/01/2023   Encephalopathy 06/01/2023   Rectal bleeding 05/14/2023   Pressure injury of skin 12/14/2022   Uncontrolled type 2 diabetes mellitus with hyperglycemia, with long-term current use of insulin  (HCC) 12/08/2022   AKI (acute kidney injury) 12/08/2022   Dry skin 10/21/2022   Atypical chest pain 08/11/2022   Fatigue 06/17/2022   Goals of care, counseling/discussion 06/16/2022   Myelodysplastic syndrome (HCC) 06/12/2022   Symptomatic anemia 05/01/2022   Chest pressure 05/01/2022   Chronic chest pain 02/17/2022   History of endocarditis 11/13/2021   Chronic anemia 11/13/2021   Acute on chronic HFrEF (heart failure with reduced ejection fraction) (HCC) 11/13/2021   Paroxysmal atrial fibrillation/A-flutter (HCC) 09/12/2021   Thrombocytopenia 09/12/2021   Vertigo, acute CVA ruled out via MRI, likely BPPV but assoc w/ ambulatory dysfunction and  not responsive to conservative tx 09/11/2021   Chronic heart failure with preserved ejection fraction (HFpEF) (HCC) 06/14/2021   Atrial fibrillation (HCC) 06/02/2021   Severe sepsis (HCC) 06/02/2021   Infection of scalp 06/02/2021   Elevated troponin 06/02/2021   Pancytopenia (HCC) 06/02/2021   Cellulitis of scalp 06/02/2021   Tinea corporis    Swelling of toe of left foot 11/02/2019   Aortic valve endocarditis    Coronary artery disease    Hypoalbuminemia 06/29/2019   Hyperglycemia 06/29/2019   Lactic acidosis 06/29/2019   GERD (gastroesophageal reflux disease) 06/29/2019   Staghorn renal calculus 06/29/2019   Nonrheumatic aortic valve stenosis 05/13/2019   Hyperlipidemia LDL goal <70 05/13/2019   Preop cardiovascular exam 05/13/2019   Bacteremia due to Enterococcus 04/18/2019   Class 2 severe obesity due to excess calories with serious comorbidity and body mass index (BMI) of 38.0 to 38.9 in adult 03/24/2018   S/P aortic valve replacement 08/07/2016   Status post coronary artery bypass graft 08/07/2016   Coronary artery disease of native artery of native heart with stable angina pectoris 02/11/2016   SVT (supraventricular tachycardia) 07/31/2015   Type 2 diabetes mellitus without complication, with long-term current use of insulin  (HCC) 03/28/2015   Benign prostatic hyperplasia 04/07/2014   OSA (obstructive sleep apnea) 04/07/2014   Essential hypertension 05/16/2009    Orientation RESPIRATION BLADDER Height & Weight     Self, Time, Situation, Place  O2 (3L) Incontinent Weight: 117.9 kg Height:  6' (182.9 cm)  BEHAVIORAL SYMPTOMS/MOOD NEUROLOGICAL BOWEL NUTRITION STATUS   (none)  (None) Incontinent Diet (carb modified)  AMBULATORY STATUS COMMUNICATION OF  NEEDS Skin   Limited Assist Verbally                         Personal Care Assistance Level of Assistance  Bathing, Feeding, Dressing Bathing Assistance: Limited assistance Feeding assistance: Limited  assistance Dressing Assistance: Limited assistance     Functional Limitations Info  Sight, Hearing, Speech Sight Info: Adequate Hearing Info: Adequate Speech Info: Adequate    SPECIAL CARE FACTORS FREQUENCY  PT (By licensed PT), OT (By licensed OT)     PT Frequency: 5 x week OT Frequency: 5 x week            Contractures Contractures Info: Not present    Additional Factors Info  Code Status, Allergies Code Status Info: Full Allergies Info: Metformin           Current Medications (06/20/2024):  This is the current hospital active medication list Current Facility-Administered Medications  Medication Dose Route Frequency Provider Last Rate Last Admin   benzonatate  (TESSALON ) capsule 100 mg  100 mg Oral TID PRN Al-Sultani, Anmar, MD   100 mg at 06/19/24 2332   ceFAZolin  (ANCEF ) IVPB 2g/100 mL premix  2 g Intravenous Q8H Hallaji, Sheema M, RPH   Stopped at 06/20/24 1450   chlorpheniramine-HYDROcodone (TUSSIONEX) 10-8 MG/5ML suspension 5 mL  5 mL Oral Q12H PRN Al-Sultani, Anmar, MD   5 mL at 06/20/24 1212   enoxaparin  (LOVENOX ) injection 60 mg  0.5 mg/kg Subcutaneous Q24H Paudel, Nena, MD   60 mg at 06/20/24 1212   furosemide  (LASIX ) tablet 40 mg  40 mg Oral BID Paudel, Keshab, MD   40 mg at 06/20/24 1003   gabapentin  (NEURONTIN ) capsule 100 mg  100 mg Oral TID Paudel, Keshab, MD   100 mg at 06/20/24 1003   hydrOXYzine  (ATARAX ) tablet 10 mg  10 mg Oral QHS Paudel, Nena, MD   10 mg at 06/19/24 2128   insulin  aspart (novoLOG ) injection 0-5 Units  0-5 Units Subcutaneous QHS Paudel, Keshab, MD   3 Units at 06/19/24 2128   insulin  aspart (novoLOG ) injection 0-9 Units  0-9 Units Subcutaneous TID WC Paudel, Keshab, MD   2 Units at 06/20/24 1212   insulin  aspart (novoLOG ) injection 3 Units  3 Units Subcutaneous TID WC Paudel, Keshab, MD   3 Units at 06/20/24 1211   insulin  glargine-yfgn (SEMGLEE ) injection 25 Units  25 Units Subcutaneous Daily Paudel, Keshab, MD   25 Units at  06/20/24 1004   ipratropium-albuterol  (DUONEB) 0.5-2.5 (3) MG/3ML nebulizer solution 3 mL  3 mL Nebulization Q4H PRN Al-Sultani, Anmar, MD       loperamide  (IMODIUM ) capsule 2 mg  2 mg Oral Q6H PRN Al-Sultani, Anmar, MD   2 mg at 06/19/24 2332   metoprolol  succinate (TOPROL -XL) 24 hr tablet 12.5 mg  12.5 mg Oral BID Paudel, Keshab, MD   12.5 mg at 06/20/24 1003   nystatin  cream (MYCOSTATIN )   Topical BID Paudel, Keshab, MD   Given at 06/20/24 1148   ranolazine  (RANEXA ) 12 hr tablet 500 mg  500 mg Oral BID Paudel, Keshab, MD   500 mg at 06/20/24 1003   rosuvastatin  (CRESTOR ) tablet 40 mg  40 mg Oral Daily Paudel, Keshab, MD   40 mg at 06/20/24 1003   tamsulosin  (FLOMAX ) capsule 0.4 mg  0.4 mg Oral Daily Paudel, Keshab, MD   0.4 mg at 06/20/24 1003     Discharge Medications: Please see discharge summary for a list of discharge medications.  Relevant Imaging Results:  Relevant Lab Results:   Additional Information SSN: [id number]  Shasta DELENA Daring, RN

## 2024-06-20 NOTE — Progress Notes (Signed)
 Dr. Sigdel gave order to discontinue [facility] orders.

## 2024-06-20 NOTE — Progress Notes (Signed)
 Pharmacy Antibiotic Note  Jonathan Cross Jonathan Cross. is a 78 y.o. male admitted on 06/18/2024 with bacteremia.  Pharmacy has been consulted for vancomycin  dosing. Patient with PMH of MRSA bacteremia from buttock abscess in Aprirl 2025 and enterococcal prosthetic valve endocarditis in 2021. He currently has multiple species of staphylococcus growing in his blood cultures  Today, 06/20/2024 Day 3 antibiotics, currently on cefazolin  (previously on vancomycin  and cefepime ) Renal: SCr 1.04 WBC WNL Afebrile 12/6 blood cultures (both drawn at 0358) L arm: S capitis R arm: S lugdunesis BCID detected S epidermidis so expect to see that organism grow on culture as both have been re-incubated 12/8 repeat Blood cx: NGTD 12/6 Urine cx:   Plan: Vancomycin  2gm IV x1 now (been >48h since received last dose of vancomycin ) then 1750mg  IV q24h  Est AUC 530 (trough 11.7 mcg/mL) using SCr 1.04 mg/dl and Vd 0.7 L/kg for BMI 35 Ceftriaxone  2gm IV q24h per ID for possible UTI Follow renal function and need to adjust dose or check levels Await culture results  Height: 6' (182.9 cm) Weight: 117.9 kg (260 lb) IBW/kg (Calculated) : 77.6  Temp (24hrs), Avg:98 F (36.7 C), Min:97.7 F (36.5 C), Max:98.6 F (37 C)  Recent Labs  Lab 06/18/24 0358 06/18/24 0604 06/18/24 1229 06/19/24 0613 06/19/24 1438 06/20/24 0530  WBC 16.3*  --   --  9.6  --  5.1  CREATININE 1.11  --  1.02  --  1.09 1.04  LATICACIDVEN 2.1* 1.7  --   --   --   --     Estimated Creatinine Clearance: 75.1 mL/min (by C-G formula based on SCr of 1.04 mg/dL).    Allergies  Allergen Reactions   Metformin Diarrhea and Anaphylaxis    Loose stools even with XR    Antimicrobials this admission: 12/6 vanco>>12/7, 12/8 >> 12/6 cefepime  >> 12/7 12/7 cefazolin  >>12/8 12/8 ceftriaxone  >>   Thank you for allowing pharmacy to be a part of this patient's care.  Georganne Siple, PharmD, BCPS, BCIDP Work Cell: [phone] 06/20/2024 4:28  PM

## 2024-06-21 ENCOUNTER — Inpatient Hospital Stay: Admitting: Certified Registered"

## 2024-06-21 ENCOUNTER — Inpatient Hospital Stay: Admit: 2024-06-21 | Discharge: 2024-06-21 | Disposition: A | Attending: Cardiology | Admitting: Cardiology

## 2024-06-21 ENCOUNTER — Encounter
Admission: EM | Disposition: A | Discharge status: Skilled Nursing Facility | Payer: Self-pay | Source: Home / Self Care | Attending: Internal Medicine

## 2024-06-21 DIAGNOSIS — I639 Cerebral infarction, unspecified: Secondary | ICD-10-CM

## 2024-06-21 DIAGNOSIS — N390 Urinary tract infection, site not specified: Secondary | ICD-10-CM | POA: Diagnosis not present

## 2024-06-21 DIAGNOSIS — R7881 Bacteremia: Secondary | ICD-10-CM | POA: Diagnosis not present

## 2024-06-21 DIAGNOSIS — B957 Other staphylococcus as the cause of diseases classified elsewhere: Secondary | ICD-10-CM | POA: Diagnosis not present

## 2024-06-21 HISTORY — PX: TEE WITHOUT CARDIOVERSION: SHX5443

## 2024-06-21 LAB — CBC
HCT: 29.3 % — ABNORMAL LOW (ref 39.0–52.0)
Hemoglobin: 9.3 g/dL — ABNORMAL LOW (ref 13.0–17.0)
MCH: 36.5 pg — ABNORMAL HIGH (ref 26.0–34.0)
MCHC: 31.7 g/dL (ref 30.0–36.0)
MCV: 114.9 fL — ABNORMAL HIGH (ref 80.0–100.0)
Platelets: 141 K/uL — ABNORMAL LOW (ref 150–400)
RBC: 2.55 MIL/uL — ABNORMAL LOW (ref 4.22–5.81)
RDW: 15.2 % (ref 11.5–15.5)
WBC: 3.9 K/uL — ABNORMAL LOW (ref 4.0–10.5)
nRBC: 0 % (ref 0.0–0.2)

## 2024-06-21 LAB — BASIC METABOLIC PANEL WITH GFR
Anion gap: 13 (ref 5–15)
BUN: 19 mg/dL (ref 8–23)
CO2: 24 mmol/L (ref 22–32)
Calcium: 8 mg/dL — ABNORMAL LOW (ref 8.9–10.3)
Chloride: 100 mmol/L (ref 98–111)
Creatinine, Ser: 0.99 mg/dL (ref 0.61–1.24)
GFR, Estimated: >60 mL/min (ref >60)
Glucose, Bld: 135 mg/dL — ABNORMAL HIGH (ref 70–99)
Potassium: 3.2 mmol/L — ABNORMAL LOW (ref 3.5–5.1)
Sodium: 137 mmol/L (ref 135–145)

## 2024-06-21 LAB — GLUCOSE, CAPILLARY
Glucose-Capillary: 142 mg/dL — ABNORMAL HIGH (ref 70–99)
Glucose-Capillary: 145 mg/dL — ABNORMAL HIGH (ref 70–99)
Glucose-Capillary: 129 mg/dL — ABNORMAL HIGH (ref 70–99)
Glucose-Capillary: 205 mg/dL — ABNORMAL HIGH (ref 70–99)

## 2024-06-21 SURGERY — ECHOCARDIOGRAM, TRANSESOPHAGEAL
Anesthesia: General

## 2024-06-21 MED ORDER — PROPOFOL 500 MG/50ML IV EMUL
INTRAVENOUS | Status: DC | PRN
  Administered 2024-06-21: 80 mg via INTRAVENOUS
  Administered 2024-06-21 (×4): 40 mg via INTRAVENOUS

## 2024-06-21 MED ORDER — POTASSIUM CHLORIDE 10 MEQ/100ML IV SOLN
10.0000 meq | INTRAVENOUS | Status: AC
  Administered 2024-06-21 (×2): 10 meq via INTRAVENOUS
  Filled 2024-06-21 (×2): qty 100

## 2024-06-21 MED ORDER — BUTAMBEN-TETRACAINE-BENZOCAINE 2-2-14 % EX AERO
INHALATION_SPRAY | CUTANEOUS | Status: AC
  Filled 2024-06-21: qty 5

## 2024-06-21 MED ORDER — SODIUM CHLORIDE 0.9 % IV SOLN: INTRAVENOUS | Status: DC

## 2024-06-21 MED ORDER — LIDOCAINE VISCOUS HCL 2 % MT SOLN
OROMUCOSAL | Status: AC
  Filled 2024-06-21: qty 15

## 2024-06-21 MED ORDER — PROPOFOL 10 MG/ML IV BOLUS
INTRAVENOUS | Status: AC
  Filled 2024-06-21: qty 20

## 2024-06-21 MED ORDER — SODIUM CHLORIDE 0.9 % IV SOLN
2.0000 g | Freq: Two times a day (BID) | INTRAVENOUS | Status: DC
  Administered 2024-06-21 – 2024-06-22 (×2): 2 g via INTRAVENOUS
  Filled 2024-06-21 (×2): qty 12.5

## 2024-06-21 MED ORDER — PHENYLEPHRINE 80 MCG/ML (10ML) SYRINGE FOR IV PUSH (FOR BLOOD PRESSURE SUPPORT)
PREFILLED_SYRINGE | INTRAVENOUS | Status: DC | PRN
  Administered 2024-06-21 (×2): 160 ug via INTRAVENOUS

## 2024-06-21 MED ORDER — LIDOCAINE HCL (CARDIAC) PF 100 MG/5ML IV SOSY
PREFILLED_SYRINGE | INTRAVENOUS | Status: DC | PRN
  Administered 2024-06-21: 80 mg via INTRAVENOUS

## 2024-06-21 NOTE — Progress Notes (Signed)
 OT Cancellation Note  Patient Details Name: Jonathan Cross. MRN: [id number] DOB: 02/05/1946   Cancelled Treatment:    Reason Eval/Treat Not Completed: Patient declined, no reason specified OT attempted treatment, he was adamantly refusing any OOB activity, only wanted to discuss religion, when OT did not engage he told OT well you're lost. OT will attempt to follow up as able.  [name] Clause 06/21/2024, 11:32 AM

## 2024-06-21 NOTE — Anesthesia Postprocedure Evaluation (Signed)
 Anesthesia Post Note  Patient: Jonathan Cross.  Procedure(s) Performed: ECHOCARDIOGRAM, TRANSESOPHAGEAL  Patient location during evaluation: Phase II Anesthesia Type: General Level of consciousness: awake Pain management: satisfactory to patient Vital Signs Assessment: post-procedure vital signs reviewed and stable Respiratory status: spontaneous breathing Cardiovascular status: blood pressure returned to baseline Anesthetic complications: no   No notable events documented.   Last Vitals:  Vitals:   06/21/24 1207 06/21/24 1320  BP: (!) 148/82 (!) 102/56  Pulse: 72 83  Resp: 14 15  Temp: 36.7 C   SpO2: 96% 95%    Last Pain:  Vitals:   06/21/24 1320  TempSrc:   PainSc: 0-No pain                 VAN STAVEREN,Dylynn Ketner

## 2024-06-21 NOTE — Progress Notes (Signed)
 Transesophageal Echocardiogram :  Indication: Bacteremia, rule out endocarditis Requesting/ordering  physician: Dr. Dino  Procedure: Benzocaine  spray x2 and 2 mls x 2 of viscous lidocaine  were given orally to provide local anesthesia to the oropharynx. The patient was positioned supine on the left side, bite block provided. The patient was moderately sedated with the doses of versed  and fentanyl  as detailed below.  Using digital technique an omniplane probe was advanced into the distal esophagus without incident.   Moderate sedation: 1. Sedation used: Per anesthesia  See report in EPIC  for complete details: In brief,  Bioprosthetic pericardial aortic valve Grossly no valve vegetation noted Aortic valve not well-visualized, unable to obtain gradient across prosthetic valve on transgastric views given challenging images, artifact  normal LV function with no RWMAs and no mural apical thrombus.  .  Estimated ejection fraction was 55%.  Right sided cardiac chambers were normal with no evidence of pulmonary hypertension.  -Mild mitral valve regurgitation  Imaging of the septum showed no ASD or VSD Bubble study was negative for shunt 2D and color flow confirmed no PFO  The LA was well visualized in orthogonal views.  There was no spontaneous contrast and no thrombus in the LA and LA appendage   The descending thoracic aorta had no  mural aortic debris with no evidence of aneurysmal dilation or dissection - Moderate diffuse aortic atherosclerosis in the descending aorta  -For follow-up assessment of bioprosthetic pericardial aortic valve, consider cardiac CTA given challenging images on TTE and TEE  Jonathan Cross 06/21/2024 1:34 PM

## 2024-06-21 NOTE — Progress Notes (Signed)
   [facility] has been requested to perform a transesophageal echocardiogram on Jonathan Cross. for bacteremia.     The patient does NOT have any absolute or relative contraindications to a Transesophageal Echocardiogram (TEE).  The patient has: History of Obstructive Sleep Apnea and history of bioprosthetic aortic valve    After careful review of history and examination, the risks and benefits of transesophageal echocardiogram have been explained including risks of esophageal damage, perforation (1:10,000 risk), bleeding, pharyngeal hematoma as well as other potential complications associated with conscious sedation including aspiration, arrhythmia, respiratory failure and death. Alternatives to treatment were discussed, questions were answered. Patient is willing to proceed.   Signed, Tykesha Konicki, NP  06/21/2024 8:06 AM

## 2024-06-21 NOTE — Anesthesia Preprocedure Evaluation (Addendum)
 Anesthesia Evaluation  Patient identified by MRN, date of birth, ID band Patient awake    Reviewed: Allergy & Precautions, NPO status , Patient's Chart, lab work & pertinent test results  Airway Mallampati: III  TM Distance: >3 FB Neck ROM: full    Dental  (+) Teeth Intact   Pulmonary shortness of breath, with exertion and at rest, sleep apnea , former smoker   Pulmonary exam normal  + decreased breath sounds      Cardiovascular Exercise Tolerance: Poor hypertension, Pt. on medications + CAD and + DOE  + dysrhythmias Atrial Fibrillation  Rhythm:Regular Rate:Normal  S/p aortic valve replacement, stenotic   Neuro/Psych TIAnegative neurological ROS  negative psych ROS   GI/Hepatic negative GI ROS, Neg liver ROS,GERD  Medicated,,  Endo/Other  diabetes, Type 2, Oral Hypoglycemic Agents  Class 3 obesity  Renal/GU negative Renal ROS  negative genitourinary   Musculoskeletal   Abdominal  (+) + obese  Peds negative pediatric ROS (+)  Hematology negative hematology ROS (+) Blood dyscrasia, anemia   Anesthesia Other Findings Past Medical History: 11/2019: Bacterial endocarditis No date: Chronic heart failure with preserved ejection fraction  (HFpEF) (HCC)     Comment:  a. 04/2022 Echo: EF 60-65%, GrII DD; b. 06/2022 RHC: PA               50/20(30), PCWP 25; c. 11/2022 Echo: EF 60-65%, mild LVH,               GrI DD. mildly reduced RV fxn, mild MR, nl fxn'ing               bioprosthetic AoV; d. 01/2023 Echo: EF 60-65%, nl fxn'ing               AoV, prob mild AS. Nl RV fxn; e. 08/2023 Echo: EF 65-70%,               no rwma, GrI DD, mildly reduced RV fxn, triv MR, Mild AS.              Asc Ao 39mm. No date: Coronary artery disease     Comment:  a. 01/2016 CABG x 2: LIMA->LAD, VG->OM; b. 05/2021 MV:               small, mild, rev apical lateral and apical inf defects               ->subtle ischemia vs artifact, EF 55-65%-->low  risk; c.               06/2022 Cath: LM 20d, LAD 80ost, 60p/m, 98m, D1 mild dzs,              LCX 50ost/p, OM1 mod dzs, RCA mild diff dzs, RPL1 75,               VG->OM2 & LIMA->LAD patent--> Med Rx. No date: CVA, old, disturbances of vision No date: Diabetes (HCC) No date: H/O aortic valve replacement     Comment:  a. 01/2016 s/p bioprosthetic AVR @ [facility] for Severe AS; b.               05/2021 Echo: EF 55-60%, nl fxn of bioprosthetic AVR; c.               04/2022 Echo: EF 60-65%, nl fxn'ing AoV; d. 11/2022 Echo:               nl fxn'ing AoV; e. 01/2023 Echo: nl fxn'ing Aov, mean grad  ; f. 08/2023 Echo: Mild AS (mean grad ). No date: History of kidney stones No date: Hx of CABG No date: Hyperlipidemia No date: Hypertension 04/2022: MDS (myelodysplastic syndrome), low grade (HCC) 05/2022: Myelodysplastic syndrome (HCC) No date: PAF (paroxysmal atrial fibrillation) (HCC)     Comment:  a. CHA2DS2VASc = 5-->eliquis nori; b. 01/2023 Amio d/c'd               due to concern re: lung toxicity/ongoing dyspnea. No date: Retinal vascular occlusion of left eye     Comment:  Several years ago No date: Sleep apnea     Comment:  BiPAP No date: Vertigo No date: Wears dentures     Comment:  full upper and lower.  Lower broken  Past Surgical History: 2017: AORTIC VALVE REPLACEMENT     Comment:  [facility], bioprosthetic No date: APPENDECTOMY No date: BONE MARROW BIOPSY     Comment:  05/2022 No date: CARDIAC VALVE REPLACEMENT 11/12/2022: CATARACT EXTRACTION W/PHACO; Right     Comment:  Procedure: CATARACT EXTRACTION PHACO AND INTRAOCULAR               LENS PLACEMENT (IOC) RIGHT DIABETIC MALYUGIN  13.49                01:06.1;  Surgeon: Mittie Gaskin, MD;  Location:               [facility];  Service: Ophthalmology;                Laterality: Right;  Diabetic 2017: CORONARY ARTERY BYPASS GRAFT     Comment:  [facility] - LIMA-LAD and SVG-OM 05/27/2019: CYSTOSCOPY W/  RETROGRADES; Right     Comment:  Procedure: CYSTOSCOPY WITH RETROGRADE PYELOGRAM;                Surgeon: Francisca Redell BROCKS, MD;  Location: [facility];                Service: Urology;  Laterality: Right; 07/11/2019: CYSTOSCOPY W/ RETROGRADES; Right     Comment:  Procedure: CYSTOSCOPY WITH RETROGRADE PYELOGRAM;                Surgeon: Francisca Redell BROCKS, MD;  Location: [facility];                Service: Urology;  Laterality: Right; 05/27/2019: CYSTOSCOPY/URETEROSCOPY/HOLMIUM LASER/STENT PLACEMENT;  Right     Comment:  Procedure: CYSTOSCOPY/URETEROSCOPY/HOLMIUM LASER/STENT               PLACEMENT;  Surgeon: Francisca Redell BROCKS, MD;  Location:               [facility];  Service: Urology;  Laterality: Right; 06/17/2019: CYSTOSCOPY/URETEROSCOPY/HOLMIUM LASER/STENT PLACEMENT;  Right     Comment:  Procedure: CYSTOSCOPY/URETEROSCOPY/HOLMIUM LASER/STENT               Exchange;  Surgeon: Francisca Redell BROCKS, MD;  Location: [facility]              ORS;  Service: Urology;  Laterality: Right; 07/11/2019: CYSTOSCOPY/URETEROSCOPY/HOLMIUM LASER/STENT PLACEMENT;  Right     Comment:  Procedure: CYSTOSCOPY/URETEROSCOPY/HOLMIUM LASER/STENT               Exchange;  Surgeon: Francisca Redell BROCKS, MD;  Location: [facility]              ORS;  Service: Urology;  Laterality: Right; 06/17/2022: RIGHT HEART CATH AND CORONARY/GRAFT ANGIOGRAPHY; Bilateral     Comment:  Procedure: RIGHT HEART CATH AND CORONARY/GRAFT  ANGIOGRAPHY;  Surgeon: Mady Bruckner, MD;  Location:               [facility];  Service: Cardiovascular;                Laterality: Bilateral; 07/11/2019: STONE EXTRACTION WITH BASKET; Right     Comment:  Procedure: STONE EXTRACTION WITH BASKET;  Surgeon:               Francisca Redell BROCKS, MD;  Location: [facility];  Service:               Urology;  Laterality: Right; 04/21/2019: TEE WITHOUT CARDIOVERSION; N/A     Comment:  Procedure: TRANSESOPHAGEAL ECHOCARDIOGRAM (TEE);                Surgeon: Perla Evalene PARAS, MD;  Location: [facility];                Service: Cardiovascular;  Laterality: N/A; 07/04/2019: TEE WITHOUT CARDIOVERSION; N/A     Comment:  Procedure: TRANSESOPHAGEAL ECHOCARDIOGRAM (TEE);                Surgeon: Darron Deatrice LABOR, MD;  Location: [facility];                Service: Cardiovascular;  Laterality: N/A; 08/17/2019: TEE WITHOUT CARDIOVERSION; N/A     Comment:  Procedure: TRANSESOPHAGEAL ECHOCARDIOGRAM (TEE);                Surgeon: Darliss Redell, MD;  Location: [facility];                Service: Cardiovascular;  Laterality: N/A; 11/15/2019: TEE WITHOUT CARDIOVERSION; N/A     Comment:  Procedure: TRANSESOPHAGEAL ECHOCARDIOGRAM (TEE);                Surgeon: Mady Bruckner, MD;  Location: [facility];                Service: Cardiovascular;  Laterality: N/A; No date: TONSILLECTOMY  BMI    Body Mass Index: 35.26 kg/m      Reproductive/Obstetrics negative OB ROS                              Anesthesia Physical Anesthesia Plan  ASA: 4  Anesthesia Plan: General   Post-op Pain Management:    Induction: Intravenous  PONV Risk Score and Plan: Propofol  infusion and TIVA  Airway Management Planned: Natural Airway and Nasal Cannula  Additional Equipment:   Intra-op Plan:   Post-operative Plan:   Informed Consent: I have reviewed the patients History and Physical, chart, labs and discussed the procedure including the risks, benefits and alternatives for the proposed anesthesia with the patient or authorized representative who has indicated his/her understanding and acceptance.     Dental Advisory Given  Plan Discussed with: CRNA  Anesthesia Plan Comments:          Anesthesia Quick Evaluation

## 2024-06-21 NOTE — TOC Progression Note (Addendum)
 Transition of Care (TOC) - Progression Note    Patient Details  Name: Jonathan Cross. MRN: [id number] Date of Birth: 22-Sep-1945  Transition of Care [facility]) CM/SW Contact  Lauraine JAYSON Carpen, LCSW Phone Number: 06/21/2024, 3:43 PM  Clinical Narrative:   Per MD, potential discharge tomorrow. [facility] admissions coordinator is aware. CSW started insurance authorization.  4:01 pm: Auth approved: [id number]. Valid 12/10-12/12. [facility] only allows two admissions per day and they already have two planned for tomorrow so they are unable to accept until Thursday. They will notify CSW tomorrow if one of their admissions falls through.   Expected Discharge Plan: Skilled Nursing Facility Barriers to Discharge: Continued Medical Work up               Expected Discharge Plan and Services                                               Social Drivers of Health (SDOH) Interventions SDOH Screenings   Food Insecurity: No Food Insecurity (06/18/2024)  Housing: Unknown (06/18/2024)  Transportation Needs: No Transportation Needs (06/18/2024)  Utilities: Not At Risk (06/18/2024)  Financial Resource Strain: Low Risk  (03/22/2024)   Received from [facility]  Physical Activity: Inactive (04/24/2021)   Received from [facility]  Social Connections: Socially Isolated (06/18/2024)  Stress: No Stress Concern Present (04/24/2021)   Received from [facility]  Tobacco Use: Medium Risk (03/31/2024)  Health Literacy: High Risk (02/25/2023)   Received from [facility]    Readmission Risk Interventions     No data to display

## 2024-06-21 NOTE — Transfer of Care (Signed)
 Immediate Anesthesia Transfer of Care Note  Patient: Jonathan Cross.  Procedure(s) Performed: ECHOCARDIOGRAM, TRANSESOPHAGEAL  Patient Location: Short Stay  Anesthesia Type:General  Level of Consciousness: awake and oriented  Airway & Oxygen Therapy: Patient Spontanous Breathing and Patient connected to nasal cannula oxygen  Post-op Assessment: Report given to RN  Post vital signs: Reviewed and stable  Last Vitals:  Vitals Value Taken Time  BP    Temp    Pulse    Resp    SpO2      Last Pain:  Vitals:   06/21/24 1207  TempSrc: Temporal  PainSc: 7       Patients Stated Pain Goal: 0 (06/18/24 1038)  Complications: No notable events documented.

## 2024-06-21 NOTE — Care Management Important Message (Signed)
 Important Message  Patient Details  Name: Jonathan Cross. MRN: [id number] Date of Birth: [birth year]   Important Message Given:  Yes - Medicare IM     Jonathan Cross 06/21/2024, 5:11 PM

## 2024-06-21 NOTE — Progress Notes (Addendum)
 PROGRESS NOTE    Jonathan Cross.  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 Patient reports generalized weakness with cough, shortness of breath, and difficulty urinating over the past few days.  CXR showed patchy left perihilar and retrocardiac airspace infiltrates suspicious for pneumonia CT head shows no acute traumatic abnormality, and new age-indeterminate right occipital infarct, remote left parietal infarct, and moderate chronic microvascular ischemic changes  CT C-spine showed no acute traumatic abnormality, multilevel chronic degenerative changes with mild to moderate central canal stenosis at C3-C4 and C6-C7 post multilevel foraminal stenoses, and bilateral carotid bulb atherosclerotic calcifications CT chest abdomen pelvis showed diffuse bilateral patchy/hazy pulmonary opacities consistent with infectious/inflammatory process, cholelithiasis without biliary dilation, benign bilateral renal cysts, and extensive atherosclerotic calcifications with prior aortic valve repair  Being treated for sepsis and bacteremia, ID consulted. TTE unremarkable.  TEE done on 12/9 showed no obvious vegetations. Needs SNF on discharge.  Assessment & Plan:  Principal Problem:   Severe sepsis (HCC) Active Problems:   Uncontrolled type 2 diabetes mellitus with hyperglycemia, with long-term current use of insulin  (HCC)   Paroxysmal atrial fibrillation/A-flutter (HCC)   Status post coronary artery bypass graft   Benign prostatic hyperplasia   Essential hypertension   OSA (obstructive sleep apnea)   S/P aortic valve  replacement   Hyperlipidemia LDL goal <70   GERD (gastroesophageal reflux disease)   CAP (community acquired pneumonia)   Sepsis secondary to UTI (HCC)   Fall at home, initial encounter    Sepsis - Met sepsis criteria on admission with tachycardia to 104, tachypnea to 22, febrile to 102.3 F per EMS (98.7 F on arrival after receiving acetaminophen  by EMS), leukocytosis to 16.3.  Lactic acid elevated 2.1.  Pneumonia and UTI likely source of infection, later found to have staph epi bacteremia - Sepsis protocol initiated in the ED - Management of infectious processes as below   Mixed gram positive bacteremia - staph capitis and staph lugdeneiss on 2 different cxs and biofire with staph epi  Concern for endocarditis given presence of bioprosthetic aortic valve - BCx (12/6) positive for Staph epidermidis - TTE (12/7) without evidence of vegetation - Antibiotics revised Ancef  2 g every 8 hours - Repeat blood cultures 12/8 AM -TEE done on 12/9 showed no obvious vegetations.   Acute hypoxic respiratory failure - resolved - Presented with several days of shortness of breath and cough for several days, with SpO2 88% when trialed on RA in the ED, with improvement to 95% on 2 L . - Likely secondary to CAP - Patient is currently on room air.   Possible bacterial CAP - Presenting with cough and shortness of breath, hypoxic to 88% on RA - Procalcitonin 0.41 - CT chest with diffuse bilateral patchy/hazy pulmonary opacities consistent with infectious/inflammatory process - On IV Ceftriaxone  and Vancomycin    Acute uncomplicated UTI - UA consistent with UTI - Urine cultures in process - On Ancef  2g q8h   Ruled out acute stroke - CT head showed new age-indeterminate right occipital infarct with remote left parietal infarct - MRI brain was ordered to delineate chronicity, noted no  intracranial abnormalities, remote left parietal and right occipital infarcts   Fall Generalized  Weakness/physical deconditioning: - Progressive generalized weakness leading to fall and inability to get up, likely secondary to acute illness given above mentioned infectious etiologies  - PT/OT evaluated patient and recommended SNF placement - TOC consulted - Fall precautions   CAD s/p CABG x 2  - LHC (06/2022) showed patent LIMA to LAD and vein graft to second obtuse marginal, otherwise moderate, diffuse CAD - Continue Toprol -XL 12.5 mg twice daily, Ranexa  500 mg twice daily, Crestor  40 mg daily   Chronic HFpEF,POA: NYHA III - TTE (08/2023) showed LVEF 65-70%, mild LVH, mildly reduced RV systolic function, trivial MR - TTE (06/19/2024) showed LVEF 60-65%, no RWMA, no LVH, G2 DD. - Continue Toprol -XL and p.o. Lasix  40 mg twice daily   Paroxysmal A-fib - Continue Toprol -XL 25 mg daily - Not on anticoagulation given history of falls, prior rectal bleeding, and anemia in the setting of mild dysplastic syndrome   Diarrhea - Patient reports multiple episodes of loose stools both at home and while currently hospitalized - GIPP and C. 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 kg/m) - Obesity is clinically significant and contributes to T2DM, HTN, HLD, HFpEF, CAD, AFib, and OSA.  - Recommend continued discussion with PCP re: lifestyle modification including dietary changes, regular physical activity, and weight reduction strategies.   Disposition: Needs SNF on discharge.    DVT prophylaxis:      Code Status: Prior Family Communication:  None at the  bedside Status is: Inpatient Remains inpatient appropriate because: pending TEE    Subjective:  No acute events overnight, He said that he had TEE done couple of times in the past so he knows what to expect.  Examination:  General exam: Appears calm and comfortable  Respiratory system: Clear to auscultation. Respiratory effort normal. Cardiovascular system: S1 & S2 heard, RRR. No JVD, murmurs, rubs, gallops or clicks. No pedal edema. Gastrointestinal system: Abdomen is nondistended, soft and nontender. No organomegaly or masses felt. Normal bowel sounds heard. Central nervous system: Alert and oriented. No focal neurological deficits. Extremities: Symmetric 5 x 5 power. Skin: No rashes, lesions or ulcers   Diet Orders (From admission, onward)     Start     Ordered   06/21/24 0942  Diet NPO time specified  Diet effective now       Comments: Patient to remain NPO until fully awake following the TEE.   06/21/24 0941            Objective: Vitals:   06/20/24 2000 06/21/24 0009 06/21/24 0400 06/21/24 0820  BP: 128/64 (!) 118/53 97/75 134/68  Pulse: 77 70  70  Resp: 20 20 20 18   Temp: 97.8 F (36.6 C) 98.4 F (36.9 C) 97.8 F (36.6 C) 97.9 F (36.6 C)  TempSrc: Oral  Axillary   SpO2: 100% 100% 99% 96%  Weight:      Height:        Intake/Output Summary (Last 24 hours) at 06/21/2024  1020 Last data filed at 06/21/2024 0953 Gross per 24 hour  Intake 587.34 ml  Output 1450 ml  Net -862.66 ml   Filed Weights   06/18/24 0511  Weight: 117.9 kg    Scheduled Meds:  enoxaparin  (LOVENOX ) injection  0.5 mg/kg Subcutaneous Q24H   furosemide   40 mg Oral BID   gabapentin   100 mg Oral TID   hydrOXYzine   10 mg Oral QHS   insulin  aspart  0-5 Units Subcutaneous QHS   insulin  aspart  0-9 Units Subcutaneous TID WC   insulin  aspart  3 Units Subcutaneous TID WC   insulin  glargine-yfgn  25 Units Subcutaneous Daily   metoprolol  succinate  12.5 mg Oral BID   nystatin  cream    Topical BID   ranolazine   500 mg Oral BID   rosuvastatin   40 mg Oral Daily   tamsulosin   0.4 mg Oral Daily   Continuous Infusions:  sodium chloride  20 mL/hr at 06/21/24 0947   cefTRIAXone  (ROCEPHIN )  IV 2 g (06/20/24 2300)   potassium chloride  10 mEq (06/21/24 0948)   vancomycin       Nutritional status     Body mass index is 35.26 kg/m.  Data Reviewed:   CBC: Recent Labs  Lab 06/18/24 0358 06/19/24 0613 06/20/24 0530 06/21/24 0402  WBC 16.3* 9.6 5.1 3.9*  NEUTROABS 13.5*  --   --   --   HGB 9.3* 9.3* 9.2* 9.3*  HCT 28.9* 29.8* 29.1* 29.3*  MCV 113.8* 115.1* 115.0* 114.9*  PLT 140* 131* 136* 141*   Basic Metabolic Panel: Recent Labs  Lab 06/18/24 0358 06/18/24 1229 06/19/24 0613 06/19/24 1438 06/20/24 0530 06/21/24 0402  NA 136 135  --  134* 139 137  K 3.7 3.8  --  3.5 3.4* 3.2*  CL 104 102  --  101 103 100  CO2 20* 19*  --  22 25 24   GLUCOSE 147* 190*  --  210* 137* 135*  BUN 14 13  --  16 16 19   CREATININE 1.11 1.02  --  1.09 1.04 0.99  CALCIUM  8.6* 8.5*  --  8.2* 8.5* 8.0*  MG  --   --  1.8  --   --   --    GFR: Estimated Creatinine Clearance: 78.9 mL/min (by C-G formula based on SCr of 0.99 mg/dL). Liver Function Tests: Recent Labs  Lab 06/18/24 0358 06/18/24 1229  AST 20 20  ALT 14 12  ALKPHOS 59 56  BILITOT 0.9 0.8  PROT 7.7 7.4  ALBUMIN 3.6 3.4*   No results for input(s): LIPASE, AMYLASE in the last 168 hours. No results for input(s): AMMONIA in the last 168 hours. Coagulation Profile: Recent Labs  Lab 06/18/24 0358  INR 1.3*   Cardiac Enzymes: No results for input(s): CKTOTAL, CKMB, CKMBINDEX, TROPONINI in the last 168 hours. BNP (last 3 results) No results for input(s): PROBNP in the last 8760 hours. HbA1C: No results for input(s): HGBA1C in the last 72 hours. CBG: Recent Labs  Lab 06/20/24 1146 06/20/24 1650 06/20/24 2100 06/20/24 2330 06/21/24 0819  GLUCAP 186* 200* 208* 178* 142*   Lipid  Profile: Recent Labs    06/19/24 0613  CHOL 135  HDL 17*  LDLCALC 81  TRIG 812*  CHOLHDL 8.1   Thyroid Function Tests: No results for input(s): TSH, T4TOTAL, FREET4, T3FREE, THYROIDAB in the last 72 hours. Anemia Panel: No results for input(s): VITAMINB12, FOLATE, FERRITIN, TIBC, IRON , RETICCTPCT in the last 72 hours. Sepsis Labs: Recent Labs  Lab 06/18/24 0358 06/18/24 0604 06/18/24 1229  PROCALCITON  --   --  0.41  LATICACIDVEN 2.1* 1.7  --     Recent Results (from the past 240 hours)  Blood Culture (routine x 2)     Status: Abnormal (Preliminary result)   Collection Time: 06/18/24  3:58 AM   Specimen: BLOOD  Result Value Ref Range Status   Specimen Description   Final    BLOOD BLOOD LEFT ARM Performed at [facility], [address], [place], [place] [address]    Special Requests   Final    BOTTLES DRAWN AEROBIC AND ANAEROBIC Blood Culture adequate volume Performed at [facility], [address] [place], [place] [address]    Culture  Setup Time   Final    GRAM POSITIVE COCCI AEROBIC BOTTLE ONLY CRITICAL VALUE NOTED.  VALUE IS CONSISTENT WITH PREVIOUSLY REPORTED AND CALLED VALUE. Performed at [facility], [address] [place], [place] [address]    Culture (A)  Final    STAPHYLOCOCCUS CAPITIS THE SIGNIFICANCE OF ISOLATING THIS ORGANISM FROM A SINGLE SET OF BLOOD CULTURES WHEN MULTIPLE SETS ARE DRAWN IS UNCERTAIN. PLEASE NOTIFY THE MICROBIOLOGY DEPARTMENT WITHIN ONE WEEK IF SPECIATION AND SENSITIVITIES ARE REQUIRED. CULTURE REINCUBATED FOR BETTER GROWTH Performed at [facility], [address], [place], [place] [address]    Report Status PENDING  Incomplete  Blood Culture (routine x 2)     Status: Abnormal (Preliminary result)   Collection Time: 06/18/24  3:58 AM   Specimen: BLOOD  Result Value Ref Range Status   Specimen Description   Final    BLOOD BLOOD RIGHT ARM Performed at [facility], [address] [place], [place] [address]    Special Requests   Final    BOTTLES DRAWN AEROBIC AND ANAEROBIC Blood Culture adequate volume Performed at [facility], [address], [place], [place] [address]    Culture  Setup Time   Final    GRAM POSITIVE COCCI IN BOTH AEROBIC AND ANAEROBIC BOTTLES CRITICAL RESULT CALLED TO, READ BACK BY AND VERIFIED WITH: JASON ROBBINS, PHARMD @0411  06/19/2024 COP    Culture (A)  Final    STAPHYLOCOCCUS LUGDUNENSIS CULTURE REINCUBATED FOR BETTER GROWTH Performed at [facility], [address], [place], [place] [address]    Report Status PENDING  Incomplete   Organism ID, Bacteria STAPHYLOCOCCUS LUGDUNENSIS  Final      Susceptibility   Staphylococcus lugdunensis - MIC*    CIPROFLOXACIN  <=0.5 SENSITIVE Sensitive     ERYTHROMYCIN <=0.25 SENSITIVE Sensitive     GENTAMICIN  <=0.5 SENSITIVE Sensitive     OXACILLIN >=4 RESISTANT Resistant     TETRACYCLINE <=1 SENSITIVE Sensitive     VANCOMYCIN  <=0.5 SENSITIVE Sensitive     TRIMETH /SULFA  <=10 SENSITIVE Sensitive     CLINDAMYCIN <=0.25 SENSITIVE Sensitive     RIFAMPIN <=0.5 SENSITIVE Sensitive     Inducible Clindamycin NEGATIVE Sensitive     * STAPHYLOCOCCUS LUGDUNENSIS  Resp panel by RT-PCR (RSV, Flu A&B, Covid) Urine, Clean Catch     Status: None   Collection Time: 06/18/24  3:58 AM   Specimen: Urine, Clean Catch; Nasal Swab  Result Value Ref Range Status   SARS Coronavirus 2 by RT PCR NEGATIVE NEGATIVE Final    Comment: (NOTE) SARS-CoV-2 target nucleic acids are NOT DETECTED.  The SARS-CoV-2 RNA is generally detectable in upper respiratory specimens during the acute phase of infection. The lowest concentration of SARS-CoV-2 viral copies  this assay can detect is 138 copies/mL. A negative result does not preclude SARS-Cov-2 infection and should not be used as the sole basis for treatment or other patient management decisions. A negative result may occur with  improper  specimen collection/handling, submission of specimen other than nasopharyngeal swab, presence of viral mutation(s) within the areas targeted by this assay, and inadequate number of viral copies(<138 copies/mL). A negative result must be combined with clinical observations, patient history, and epidemiological information. The expected result is Negative.  Fact Sheet for Patients:  bloggercourse.com  Fact Sheet for Healthcare Providers:  seriousbroker.it  This test is no t yet approved or cleared by the [place]  FDA and  has been authorized for detection and/or diagnosis of SARS-CoV-2 by FDA under an Emergency Use Authorization (EUA). This EUA will remain  in effect (meaning this test can be used) for the duration of the COVID-19 declaration under Section 564(b)(1) of the Act, 21 U.S.C.section 360bbb-3(b)(1), unless the authorization is terminated  or revoked sooner.       Influenza A by PCR NEGATIVE NEGATIVE Final   Influenza B by PCR NEGATIVE NEGATIVE Final    Comment: (NOTE) The Xpert Xpress SARS-CoV-2/FLU/RSV plus assay is intended as an aid in the diagnosis of influenza from Nasopharyngeal swab specimens and should not be used as a sole basis for treatment. Nasal washings and aspirates are unacceptable for Xpert Xpress SARS-CoV-2/FLU/RSV testing.  Fact Sheet for Patients: bloggercourse.com  Fact Sheet for Healthcare Providers: seriousbroker.it  This test is not yet approved or cleared by the [place]  FDA and has been authorized for detection and/or diagnosis of SARS-CoV-2 by FDA under an Emergency Use Authorization (EUA). This EUA will remain in effect (meaning this test can be used) for the duration of the COVID-19 declaration under Section 564(b)(1) of the Act, 21 U.S.C. section 360bbb-3(b)(1), unless the authorization is terminated or revoked.     Resp  Syncytial Virus by PCR NEGATIVE NEGATIVE Final    Comment: (NOTE) Fact Sheet for Patients: bloggercourse.com  Fact Sheet for Healthcare Providers: seriousbroker.it  This test is not yet approved or cleared by the [place]  FDA and has been authorized for detection and/or diagnosis of SARS-CoV-2 by FDA under an Emergency Use Authorization (EUA). This EUA will remain in effect (meaning this test can be used) for the duration of the COVID-19 declaration under Section 564(b)(1) of the Act, 21 U.S.C. section 360bbb-3(b)(1), unless the authorization is terminated or revoked.  Performed at [facility], [address], [place], [place] [address]   Urine Culture     Status: Abnormal (Preliminary result)   Collection Time: 06/18/24  3:58 AM   Specimen: Urine, Random  Result Value Ref Range Status   Specimen Description   Final    URINE, RANDOM Performed at [facility], [address], [place], [place] [address]    Special Requests   Final    NONE Reflexed from [id number] Performed at [facility], [address] [place], [place] [address]    Culture >=100,000 COLONIES/mL GRAM NEGATIVE RODS (A)  Final   Report Status PENDING  Incomplete  Blood Culture ID Panel (Reflexed)     Status: Abnormal   Collection Time: 06/18/24  3:58 AM  Result Value Ref Range Status   Enterococcus faecalis NOT DETECTED NOT DETECTED Final   Enterococcus Faecium NOT DETECTED NOT DETECTED Final   Listeria monocytogenes NOT DETECTED NOT DETECTED Final   Staphylococcus species DETECTED (A) NOT DETECTED Final  Comment: CRITICAL RESULT CALLED TO, READ BACK BY AND VERIFIED WITH: JASON ROBBINS, PHARMD @0411  06/19/2024 COP    Staphylococcus aureus (BCID) NOT DETECTED NOT DETECTED Final   Staphylococcus epidermidis DETECTED (A) NOT DETECTED Final    Comment: CRITICAL RESULT CALLED TO, READ BACK BY AND VERIFIED WITH: JASON ROBBINS,  PHARMD @0411  06/19/2024 COP    Staphylococcus lugdunensis NOT DETECTED NOT DETECTED Final   Streptococcus species NOT DETECTED NOT DETECTED Final   Streptococcus agalactiae NOT DETECTED NOT DETECTED Final   Streptococcus pneumoniae NOT DETECTED NOT DETECTED Final   Streptococcus pyogenes NOT DETECTED NOT DETECTED Final   A.calcoaceticus-baumannii NOT DETECTED NOT DETECTED Final   Bacteroides fragilis NOT DETECTED NOT DETECTED Final   Enterobacterales NOT DETECTED NOT DETECTED Final   Enterobacter cloacae complex NOT DETECTED NOT DETECTED Final   Escherichia coli NOT DETECTED NOT DETECTED Final   Klebsiella aerogenes NOT DETECTED NOT DETECTED Final   Klebsiella oxytoca NOT DETECTED NOT DETECTED Final   Klebsiella pneumoniae NOT DETECTED NOT DETECTED Final   Proteus species NOT DETECTED NOT DETECTED Final   Salmonella species NOT DETECTED NOT DETECTED Final   Serratia marcescens NOT DETECTED NOT DETECTED Final   Haemophilus influenzae NOT DETECTED NOT DETECTED Final   Neisseria meningitidis NOT DETECTED NOT DETECTED Final   Pseudomonas aeruginosa NOT DETECTED NOT DETECTED Final   Stenotrophomonas maltophilia NOT DETECTED NOT DETECTED Final   Candida albicans NOT DETECTED NOT DETECTED Final   Candida auris NOT DETECTED NOT DETECTED Final   Candida glabrata NOT DETECTED NOT DETECTED Final   Candida krusei NOT DETECTED NOT DETECTED Final   Candida parapsilosis NOT DETECTED NOT DETECTED Final   Candida tropicalis NOT DETECTED NOT DETECTED Final   Cryptococcus neoformans/gattii NOT DETECTED NOT DETECTED Final   Methicillin resistance mecA/C NOT DETECTED NOT DETECTED Final    Comment: Performed at [facility], [address] [place], [place] [address]  C Difficile Quick Screen w PCR reflex     Status: None   Collection Time: 06/18/24 12:53 PM   Specimen: STOOL  Result Value Ref Range Status   C Diff antigen NEGATIVE NEGATIVE Final   C Diff toxin NEGATIVE NEGATIVE Final   C  Diff interpretation No C. difficile detected.  Final    Comment: Performed at [facility], [address] [place], [place] [address]  Gastrointestinal Panel by PCR , Stool     Status: None   Collection Time: 06/19/24 12:53 PM   Specimen: Stool  Result Value Ref Range Status   Campylobacter species NOT DETECTED NOT DETECTED Final   Plesimonas shigelloides NOT DETECTED NOT DETECTED Final   Salmonella species NOT DETECTED NOT DETECTED Final   Yersinia enterocolitica NOT DETECTED NOT DETECTED Final   Vibrio species NOT DETECTED NOT DETECTED Final   Vibrio cholerae NOT DETECTED NOT DETECTED Final   Enteroaggregative E coli (EAEC) NOT DETECTED NOT DETECTED Final   Enteropathogenic E coli (EPEC) NOT DETECTED NOT DETECTED Final   Enterotoxigenic E coli (ETEC) NOT DETECTED NOT DETECTED Final   Shiga like toxin producing E coli (STEC) NOT DETECTED NOT DETECTED Final   Shigella/Enteroinvasive E coli (EIEC) NOT DETECTED NOT DETECTED Final   Cryptosporidium NOT DETECTED NOT DETECTED Final   Cyclospora cayetanensis NOT DETECTED NOT DETECTED Final   Entamoeba histolytica NOT DETECTED NOT DETECTED Final   Giardia lamblia NOT DETECTED NOT DETECTED Final   Adenovirus F40/41 NOT DETECTED NOT DETECTED Final   Astrovirus NOT DETECTED NOT DETECTED Final   Norovirus GI/GII  NOT DETECTED NOT DETECTED Final   Rotavirus A NOT DETECTED NOT DETECTED Final   Sapovirus (I, II, IV, and V) NOT DETECTED NOT DETECTED Final    Comment: Performed at [facility], [address] [place], [place] [address]  Culture, blood (Routine X 2) w Reflex to ID Panel     Status: None (Preliminary result)   Collection Time: 06/20/24  5:30 AM   Specimen: BLOOD  Result Value Ref Range Status   Specimen Description BLOOD BLOOD RIGHT ARM  Final   Special Requests   Final    BOTTLES DRAWN AEROBIC AND ANAEROBIC Blood Culture adequate volume   Culture   Final    NO GROWTH 1 DAY Performed at [facility],  [address], [place], [place] [address]    Report Status PENDING  Incomplete  Culture, blood (Routine X 2) w Reflex to ID Panel     Status: None (Preliminary result)   Collection Time: 06/20/24  5:36 AM   Specimen: BLOOD  Result Value Ref Range Status   Specimen Description BLOOD BLOOD RIGHT HAND  Final   Special Requests   Final    BOTTLES DRAWN AEROBIC AND ANAEROBIC Blood Culture adequate volume   Culture   Final    NO GROWTH 1 DAY Performed at [facility], [address], [place], [place] [address]    Report Status PENDING  Incomplete         Radiology Studies: No results found.         LOS: 3 days   Time spent= 35 mins    Deliliah Room, MD [facility]  If 7PM-7AM, please contact night-coverage  06/21/2024, 10:20 AM

## 2024-06-21 NOTE — TOC Progression Note (Addendum)
 Transition of Care (TOC) - Progression Note    Patient Details  Name: Jonathan Cross. MRN: [id number] Date of Birth: [birth year]  Transition of Care [facility]) CM/SW Contact  Shasta DELENA Daring, RN Phone Number: 06/21/2024, 1:28 PM  Clinical Narrative:     Advised patient he has bed offer from [facility]. Called patient son and advised the same.     Expected Discharge Plan: Skilled Nursing Facility Barriers to Discharge: Continued Medical Work up               Expected Discharge Plan and Services                                               Social Drivers of Health (SDOH) Interventions SDOH Screenings   Food Insecurity: No Food Insecurity (06/18/2024)  Housing: Unknown (06/18/2024)  Transportation Needs: No Transportation Needs (06/18/2024)  Utilities: Not At Risk (06/18/2024)  Financial Resource Strain: Low Risk  (03/22/2024)   Received from [facility]  Physical Activity: Inactive (04/24/2021)   Received from [facility]  Social Connections: Socially Isolated (06/18/2024)  Stress: No Stress Concern Present (04/24/2021)   Received from [facility]  Tobacco Use: Medium Risk (03/31/2024)  Health Literacy: High Risk (02/25/2023)   Received from [facility]    Readmission Risk Interventions     No data to display

## 2024-06-21 NOTE — Progress Notes (Addendum)
 INFECTIOUS DISEASE PROGRESS NOTE Date of Admission:  06/18/2024     ID: Jonathan Cross. is a 78 y.o. male with  bacteremia Principal Problem:   Severe sepsis (HCC) Active Problems:   Benign prostatic hyperplasia   Essential hypertension   OSA (obstructive sleep apnea)   S/P aortic valve replacement   Status post coronary artery bypass graft   Hyperlipidemia LDL goal <70   GERD (gastroesophageal reflux disease)   Paroxysmal atrial fibrillation/A-flutter (HCC)   Uncontrolled type 2 diabetes mellitus with hyperglycemia, with long-term current use of insulin  (HCC)   CAP (community acquired pneumonia)   Sepsis secondary to UTI (HCC)   Fall at home, initial encounter   Subjective: No fever, wbc down to 3   ROS  Eleven systems are reviewed and negative except per hpi  Medications:  Antibiotics Given (last 72 hours)     Date/Time Action Medication Dose Rate   06/18/24 1528 New Bag/Given   ceFEPIme  (MAXIPIME ) 2 g in sodium chloride  0.9 % 100 mL IVPB 2 g 200 mL/hr   06/18/24 2147 New Bag/Given   ceFEPIme  (MAXIPIME ) 2 g in sodium chloride  0.9 % 100 mL IVPB 2 g 200 mL/hr   06/19/24 0531 New Bag/Given   ceFAZolin  (ANCEF ) IVPB 2g/100 mL premix 2 g 200 mL/hr   06/19/24 1715 New Bag/Given   ceFAZolin  (ANCEF ) IVPB 2g/100 mL premix 2 g 200 mL/hr   06/19/24 2135 New Bag/Given   ceFAZolin  (ANCEF ) IVPB 2g/100 mL premix 2 g 200 mL/hr   06/20/24 0559 New Bag/Given   ceFAZolin  (ANCEF ) IVPB 2g/100 mL premix 2 g 200 mL/hr   06/20/24 1407 New Bag/Given   ceFAZolin  (ANCEF ) IVPB 2g/100 mL premix 2 g 200 mL/hr   06/20/24 1819 New Bag/Given   vancomycin  (VANCOREADY) IVPB 2000 mg/400 mL 2,000 mg 200 mL/hr   06/20/24 2300 New Bag/Given   cefTRIAXone  (ROCEPHIN ) 2 g in sodium chloride  0.9 % 100 mL IVPB 2 g 200 mL/hr       enoxaparin  (LOVENOX ) injection  0.5 mg/kg Subcutaneous Q24H   furosemide   40 mg Oral BID   gabapentin   100 mg Oral TID   hydrOXYzine   10 mg Oral QHS   insulin  aspart  0-5  Units Subcutaneous QHS   insulin  aspart  0-9 Units Subcutaneous TID WC   insulin  aspart  3 Units Subcutaneous TID WC   insulin  glargine-yfgn  25 Units Subcutaneous Daily   metoprolol  succinate  12.5 mg Oral BID   nystatin  cream   Topical BID   ranolazine   500 mg Oral BID   rosuvastatin   40 mg Oral Daily   tamsulosin   0.4 mg Oral Daily    Objective: Vital signs in last 24 hours: Temp:  [97.7 F (36.5 C)-98.4 F (36.9 C)] 97.9 F (36.6 C) (12/09 0820) Pulse Rate:  [70-84] 70 (12/09 0820) Resp:  [18-20] 18 (12/09 0820) BP: (97-137)/(53-75) 134/68 (12/09 0820) SpO2:  [95 %-100 %] 96 % (12/09 0820) Constitutional: He is sitting up in a chair HENT: anicteric Mouth/Throat: Oropharynx is clear and moist. No oropharyngeal exudate.  Cardiovascular: Normal rate, regular rhythm and normal heart sounds. 2/6 sm   Pulmonary/Chest: Effort normal and breath sounds normal. No respiratory distress. He has no wheezes.  Abdominal: Soft. Bowel sounds are normal. He exhibits no distension. There is no tenderness.  Lymphadenopathy:  He has no cervical adenopathy.  Neurological: He is awake and interactive Skin: Skin is warm and dry. No rash noted. No erythema.  Psychiatric: He has a  normal mood and affect. His behavior is normal.   Lab Results Recent Labs    06/20/24 0530 06/21/24 0402  WBC 5.1 3.9*  HGB 9.2* 9.3*  HCT 29.1* 29.3*  NA 139 137  K 3.4* 3.2*  CL 103 100  CO2 25 24  BUN 16 19  CREATININE 1.04 0.99    Microbiology: Results for orders placed or performed during the hospital encounter of 06/18/24  Blood Culture (routine x 2)     Status: Abnormal (Preliminary result)   Collection Time: 06/18/24  3:58 AM   Specimen: BLOOD  Result Value Ref Range Status   Specimen Description   Final    BLOOD BLOOD LEFT ARM Performed at [facility], [address], [place], [place] [address]    Special Requests   Final    BOTTLES DRAWN AEROBIC AND ANAEROBIC Blood Culture adequate  volume Performed at [facility], [address] [place], [place] [address]    Culture  Setup Time   Final    GRAM POSITIVE COCCI AEROBIC BOTTLE ONLY CRITICAL VALUE NOTED.  VALUE IS CONSISTENT WITH PREVIOUSLY REPORTED AND CALLED VALUE. Performed at [facility], [address] [place], [place] [address]    Culture (A)  Final    STAPHYLOCOCCUS CAPITIS THE SIGNIFICANCE OF ISOLATING THIS ORGANISM FROM A SINGLE SET OF BLOOD CULTURES WHEN MULTIPLE SETS ARE DRAWN IS UNCERTAIN. PLEASE NOTIFY THE MICROBIOLOGY DEPARTMENT WITHIN ONE WEEK IF SPECIATION AND SENSITIVITIES ARE REQUIRED. SUSCEPTIBILITIES TO FOLLOW Performed at [facility], [address] [place], [place] [address]    Report Status PENDING  Incomplete  Blood Culture (routine x 2)     Status: Abnormal (Preliminary result)   Collection Time: 06/18/24  3:58 AM   Specimen: BLOOD  Result Value Ref Range Status   Specimen Description   Final    BLOOD BLOOD RIGHT ARM Performed at [facility], [address] [place], [place] [address]    Special Requests   Final    BOTTLES DRAWN AEROBIC AND ANAEROBIC Blood Culture adequate volume Performed at [facility], [address] [place], [place] [address]    Culture  Setup Time   Final    GRAM POSITIVE COCCI IN BOTH AEROBIC AND ANAEROBIC BOTTLES CRITICAL RESULT CALLED TO, READ BACK BY AND VERIFIED WITH: JASON ROBBINS, PHARMD @0411  06/19/2024 COP    Culture (A)  Final    STAPHYLOCOCCUS LUGDUNENSIS STAPHYLOCOCCUS EPIDERMIDIS STAPHYLOCOCCUS CAPITIS SUSCEPTIBILITIES TO FOLLOW Performed at [facility], [address] [place], [place] [address]    Report Status PENDING  Incomplete   Organism ID, Bacteria STAPHYLOCOCCUS LUGDUNENSIS  Final      Susceptibility   Staphylococcus lugdunensis - MIC*    CIPROFLOXACIN  <=0.5 SENSITIVE Sensitive     ERYTHROMYCIN <=0.25 SENSITIVE Sensitive     GENTAMICIN  <=0.5 SENSITIVE Sensitive      OXACILLIN >=4 RESISTANT Resistant     TETRACYCLINE <=1 SENSITIVE Sensitive     VANCOMYCIN  <=0.5 SENSITIVE Sensitive     TRIMETH /SULFA  <=10 SENSITIVE Sensitive     CLINDAMYCIN <=0.25 SENSITIVE Sensitive     RIFAMPIN <=0.5 SENSITIVE Sensitive     Inducible Clindamycin NEGATIVE Sensitive     * STAPHYLOCOCCUS LUGDUNENSIS  Resp panel by RT-PCR (RSV, Flu A&B, Covid) Urine, Clean Catch     Status: None   Collection Time: 06/18/24  3:58 AM   Specimen: Urine, Clean Catch; Nasal Swab  Result Value Ref Range Status   SARS Coronavirus 2 by  RT PCR NEGATIVE NEGATIVE Final    Comment: (NOTE) SARS-CoV-2 target nucleic acids are NOT DETECTED.  The SARS-CoV-2 RNA is generally detectable in upper respiratory specimens during the acute phase of infection. The lowest concentration of SARS-CoV-2 viral copies this assay can detect is 138 copies/mL. A negative result does not preclude SARS-Cov-2 infection and should not be used as the sole basis for treatment or other patient management decisions. A negative result may occur with  improper specimen collection/handling, submission of specimen other than nasopharyngeal swab, presence of viral mutation(s) within the areas targeted by this assay, and inadequate number of viral copies(<138 copies/mL). A negative result must be combined with clinical observations, patient history, and epidemiological information. The expected result is Negative.  Fact Sheet for Patients:  bloggercourse.com  Fact Sheet for Healthcare Providers:  seriousbroker.it  This test is no t yet approved or cleared by the [place]  FDA and  has been authorized for detection and/or diagnosis of SARS-CoV-2 by FDA under an Emergency Use Authorization (EUA). This EUA will remain  in effect (meaning this test can be used) for the duration of the COVID-19 declaration under Section 564(b)(1) of the Act, 21 U.S.C.section 360bbb-3(b)(1),  unless the authorization is terminated  or revoked sooner.       Influenza A by PCR NEGATIVE NEGATIVE Final   Influenza B by PCR NEGATIVE NEGATIVE Final    Comment: (NOTE) The Xpert Xpress SARS-CoV-2/FLU/RSV plus assay is intended as an aid in the diagnosis of influenza from Nasopharyngeal swab specimens and should not be used as a sole basis for treatment. Nasal washings and aspirates are unacceptable for Xpert Xpress SARS-CoV-2/FLU/RSV testing.  Fact Sheet for Patients: bloggercourse.com  Fact Sheet for Healthcare Providers: seriousbroker.it  This test is not yet approved or cleared by the [place]  FDA and has been authorized for detection and/or diagnosis of SARS-CoV-2 by FDA under an Emergency Use Authorization (EUA). This EUA will remain in effect (meaning this test can be used) for the duration of the COVID-19 declaration under Section 564(b)(1) of the Act, 21 U.S.C. section 360bbb-3(b)(1), unless the authorization is terminated or revoked.     Resp Syncytial Virus by PCR NEGATIVE NEGATIVE Final    Comment: (NOTE) Fact Sheet for Patients: bloggercourse.com  Fact Sheet for Healthcare Providers: seriousbroker.it  This test is not yet approved or cleared by the [place]  FDA and has been authorized for detection and/or diagnosis of SARS-CoV-2 by FDA under an Emergency Use Authorization (EUA). This EUA will remain in effect (meaning this test can be used) for the duration of the COVID-19 declaration under Section 564(b)(1) of the Act, 21 U.S.C. section 360bbb-3(b)(1), unless the authorization is terminated or revoked.  Performed at [facility], [address], [place], [place] [address]   Urine Culture     Status: Abnormal (Preliminary result)   Collection Time: 06/18/24  3:58 AM   Specimen: Urine, Random  Result Value Ref Range Status   Specimen  Description   Final    URINE, RANDOM Performed at [facility], [address], [place], [place] [address]    Special Requests   Final    NONE Reflexed from [id number] Performed at [facility], [address] [place], [place] [address]    Culture >=100,000 COLONIES/mL KLEBSIELLA AEROGENES (A)  Final   Report Status PENDING  Incomplete  Blood Culture ID Panel (Reflexed)     Status: Abnormal   Collection Time: 06/18/24  3:58 AM  Result Value Ref Range  Status   Enterococcus faecalis NOT DETECTED NOT DETECTED Final   Enterococcus Faecium NOT DETECTED NOT DETECTED Final   Listeria monocytogenes NOT DETECTED NOT DETECTED Final   Staphylococcus species DETECTED (A) NOT DETECTED Final    Comment: CRITICAL RESULT CALLED TO, READ BACK BY AND VERIFIED WITH: JASON ROBBINS, PHARMD @0411  06/19/2024 COP    Staphylococcus aureus (BCID) NOT DETECTED NOT DETECTED Final   Staphylococcus epidermidis DETECTED (A) NOT DETECTED Final    Comment: CRITICAL RESULT CALLED TO, READ BACK BY AND VERIFIED WITH: JASON ROBBINS, PHARMD @0411  06/19/2024 COP    Staphylococcus lugdunensis NOT DETECTED NOT DETECTED Final   Streptococcus species NOT DETECTED NOT DETECTED Final   Streptococcus agalactiae NOT DETECTED NOT DETECTED Final   Streptococcus pneumoniae NOT DETECTED NOT DETECTED Final   Streptococcus pyogenes NOT DETECTED NOT DETECTED Final   A.calcoaceticus-baumannii NOT DETECTED NOT DETECTED Final   Bacteroides fragilis NOT DETECTED NOT DETECTED Final   Enterobacterales NOT DETECTED NOT DETECTED Final   Enterobacter cloacae complex NOT DETECTED NOT DETECTED Final   Escherichia coli NOT DETECTED NOT DETECTED Final   Klebsiella aerogenes NOT DETECTED NOT DETECTED Final   Klebsiella oxytoca NOT DETECTED NOT DETECTED Final   Klebsiella pneumoniae NOT DETECTED NOT DETECTED Final   Proteus species NOT DETECTED NOT DETECTED Final   Salmonella species NOT DETECTED NOT DETECTED Final   Serratia  marcescens NOT DETECTED NOT DETECTED Final   Haemophilus influenzae NOT DETECTED NOT DETECTED Final   Neisseria meningitidis NOT DETECTED NOT DETECTED Final   Pseudomonas aeruginosa NOT DETECTED NOT DETECTED Final   Stenotrophomonas maltophilia NOT DETECTED NOT DETECTED Final   Candida albicans NOT DETECTED NOT DETECTED Final   Candida auris NOT DETECTED NOT DETECTED Final   Candida glabrata NOT DETECTED NOT DETECTED Final   Candida krusei NOT DETECTED NOT DETECTED Final   Candida parapsilosis NOT DETECTED NOT DETECTED Final   Candida tropicalis NOT DETECTED NOT DETECTED Final   Cryptococcus neoformans/gattii NOT DETECTED NOT DETECTED Final   Methicillin resistance mecA/C NOT DETECTED NOT DETECTED Final    Comment: Performed at [facility], [address] [place], [place] [address]  C Difficile Quick Screen w PCR reflex     Status: None   Collection Time: 06/18/24 12:53 PM   Specimen: STOOL  Result Value Ref Range Status   C Diff antigen NEGATIVE NEGATIVE Final   C Diff toxin NEGATIVE NEGATIVE Final   C Diff interpretation No C. difficile detected.  Final    Comment: Performed at [facility], [address] [place], [place] [address]  Gastrointestinal Panel by PCR , Stool     Status: None   Collection Time: 06/19/24 12:53 PM   Specimen: Stool  Result Value Ref Range Status   Campylobacter species NOT DETECTED NOT DETECTED Final   Plesimonas shigelloides NOT DETECTED NOT DETECTED Final   Salmonella species NOT DETECTED NOT DETECTED Final   Yersinia enterocolitica NOT DETECTED NOT DETECTED Final   Vibrio species NOT DETECTED NOT DETECTED Final   Vibrio cholerae NOT DETECTED NOT DETECTED Final   Enteroaggregative E coli (EAEC) NOT DETECTED NOT DETECTED Final   Enteropathogenic E coli (EPEC) NOT DETECTED NOT DETECTED Final   Enterotoxigenic E coli (ETEC) NOT DETECTED NOT DETECTED Final   Shiga like toxin producing E coli (STEC) NOT DETECTED NOT DETECTED Final    Shigella/Enteroinvasive E coli (EIEC) NOT DETECTED NOT DETECTED Final   Cryptosporidium NOT DETECTED NOT DETECTED Final   Cyclospora cayetanensis NOT DETECTED NOT DETECTED  Final   Entamoeba histolytica NOT DETECTED NOT DETECTED Final   Giardia lamblia NOT DETECTED NOT DETECTED Final   Adenovirus F40/41 NOT DETECTED NOT DETECTED Final   Astrovirus NOT DETECTED NOT DETECTED Final   Norovirus GI/GII NOT DETECTED NOT DETECTED Final   Rotavirus A NOT DETECTED NOT DETECTED Final   Sapovirus (I, II, IV, and V) NOT DETECTED NOT DETECTED Final    Comment: Performed at [facility], [address] [place], [place] [address]  Culture, blood (Routine X 2) w Reflex to ID Panel     Status: None (Preliminary result)   Collection Time: 06/20/24  5:30 AM   Specimen: BLOOD  Result Value Ref Range Status   Specimen Description BLOOD BLOOD RIGHT ARM  Final   Special Requests   Final    BOTTLES DRAWN AEROBIC AND ANAEROBIC Blood Culture adequate volume   Culture   Final    NO GROWTH 1 DAY Performed at [facility], [address] [place], [place] [address]    Report Status PENDING  Incomplete  Culture, blood (Routine X 2) w Reflex to ID Panel     Status: None (Preliminary result)   Collection Time: 06/20/24  5:36 AM   Specimen: BLOOD  Result Value Ref Range Status   Specimen Description BLOOD BLOOD RIGHT HAND  Final   Special Requests   Final    BOTTLES DRAWN AEROBIC AND ANAEROBIC Blood Culture adequate volume   Culture   Final    NO GROWTH 1 DAY Performed at [facility], [address], [place], [place] [address]    Report Status PENDING  Incomplete    Studies/Results: No results found.  Assessment/Plan: Algenis Ballin. is a 78 y.o. male with multiple medical issues admitted after a fall and found to have UTI and mixed gram positive bacteremia.    Recommendations Possible UTI- UA > 50 wbc. UCX Kleb aerogenes Change to cefepime  for the Kleb pending  sensis   Mixed gram positive bacteremia - staph capitis, staph epi and staph lugdeneiss on one set and staph capitis on second set I suspect all contaminant.  TEE negative  Thank you very much for the consult. Will follow with you.  Jonathan Cross   06/21/2024, 9:55 AM

## 2024-06-22 ENCOUNTER — Encounter: Payer: Self-pay | Admitting: Cardiovascular Disease

## 2024-06-22 DIAGNOSIS — N390 Urinary tract infection, site not specified: Secondary | ICD-10-CM | POA: Diagnosis not present

## 2024-06-22 DIAGNOSIS — A419 Sepsis, unspecified organism: Secondary | ICD-10-CM | POA: Diagnosis not present

## 2024-06-22 DIAGNOSIS — B957 Other staphylococcus as the cause of diseases classified elsewhere: Secondary | ICD-10-CM | POA: Diagnosis not present

## 2024-06-22 DIAGNOSIS — R7881 Bacteremia: Secondary | ICD-10-CM | POA: Diagnosis not present

## 2024-06-22 DIAGNOSIS — R6520 Severe sepsis without septic shock: Secondary | ICD-10-CM | POA: Diagnosis not present

## 2024-06-22 LAB — BASIC METABOLIC PANEL WITH GFR
Anion gap: 12 (ref 5–15)
BUN: 15 mg/dL (ref 8–23)
CO2: 22 mmol/L (ref 22–32)
Calcium: 8.2 mg/dL — ABNORMAL LOW (ref 8.9–10.3)
Chloride: 103 mmol/L (ref 98–111)
Creatinine, Ser: 0.89 mg/dL (ref 0.61–1.24)
GFR, Estimated: >60 mL/min (ref >60)
Glucose, Bld: 165 mg/dL — ABNORMAL HIGH (ref 70–99)
Potassium: 3.6 mmol/L (ref 3.5–5.1)
Sodium: 136 mmol/L (ref 135–145)

## 2024-06-22 LAB — CULTURE, BLOOD (ROUTINE X 2)
Culture  Setup Time: NO GROWTH
Culture  Setup Time: NO GROWTH
Special Requests: ADEQUATE
Special Requests: ADEQUATE

## 2024-06-22 LAB — GLUCOSE, CAPILLARY
Glucose-Capillary: 143 mg/dL — ABNORMAL HIGH (ref 70–99)
Glucose-Capillary: 179 mg/dL — ABNORMAL HIGH (ref 70–99)
Glucose-Capillary: 240 mg/dL — ABNORMAL HIGH (ref 70–99)
Glucose-Capillary: 207 mg/dL — ABNORMAL HIGH (ref 70–99)

## 2024-06-22 LAB — CBC
HCT: 28 % — ABNORMAL LOW (ref 39.0–52.0)
Hemoglobin: 9.2 g/dL — ABNORMAL LOW (ref 13.0–17.0)
MCH: 36.5 pg — ABNORMAL HIGH (ref 26.0–34.0)
MCHC: 32.9 g/dL (ref 30.0–36.0)
MCV: 111.1 fL — ABNORMAL HIGH (ref 80.0–100.0)
Platelets: 160 K/uL (ref 150–400)
RBC: 2.52 MIL/uL — ABNORMAL LOW (ref 4.22–5.81)
RDW: 15.3 % (ref 11.5–15.5)
WBC: 3.7 K/uL — ABNORMAL LOW (ref 4.0–10.5)
nRBC: 0 % (ref 0.0–0.2)

## 2024-06-22 LAB — URINE CULTURE: Culture: >=100000 — AB

## 2024-06-22 LAB — ECHO TEE

## 2024-06-22 LAB — HEMOGLOBIN A1C

## 2024-06-22 MED ORDER — SULFAMETHOXAZOLE-TRIMETHOPRIM 800-160 MG PO TABS
1.0000 | ORAL_TABLET | Freq: Two times a day (BID) | ORAL | Status: DC
  Administered 2024-06-22 – 2024-06-23 (×3): 1 via ORAL
  Filled 2024-06-22 (×4): qty 1

## 2024-06-22 NOTE — Progress Notes (Addendum)
 INFECTIOUS DISEASE PROGRESS NOTE Date of Admission:  06/18/2024     ID: Jonathan Cross. is a 78 y.o. male with  UTI and bacteremia Principal Problem:   Severe sepsis (HCC) Active Problems:   Benign prostatic hyperplasia   Essential hypertension   OSA (obstructive sleep apnea)   S/P aortic valve replacement   Status post coronary artery bypass graft   Hyperlipidemia LDL goal <70   GERD (gastroesophageal reflux disease)   Paroxysmal atrial fibrillation/A-flutter (HCC)   Uncontrolled type 2 diabetes mellitus with hyperglycemia, with long-term current use of insulin  (HCC)   CAP (community acquired pneumonia)   Sepsis secondary to UTI (HCC)   Fall at home, initial encounter   Occipital stroke (HCC)   Subjective:  No new co No fever, wbc down to 3   ROS  Eleven systems are reviewed and negative except per hpi  Medications:  Antibiotics Given (last 72 hours)     Date/Time Action Medication Dose Rate   06/19/24 1715 New Bag/Given   ceFAZolin  (ANCEF ) IVPB 2g/100 mL premix 2 g 200 mL/hr   06/19/24 2135 New Bag/Given   ceFAZolin  (ANCEF ) IVPB 2g/100 mL premix 2 g 200 mL/hr   06/20/24 0559 New Bag/Given   ceFAZolin  (ANCEF ) IVPB 2g/100 mL premix 2 g 200 mL/hr   06/20/24 1407 New Bag/Given   ceFAZolin  (ANCEF ) IVPB 2g/100 mL premix 2 g 200 mL/hr   06/20/24 1819 New Bag/Given   vancomycin  (VANCOREADY) IVPB 2000 mg/400 mL 2,000 mg 200 mL/hr   06/20/24 2300 New Bag/Given   cefTRIAXone  (ROCEPHIN ) 2 g in sodium chloride  0.9 % 100 mL IVPB 2 g 200 mL/hr   06/21/24 1441 New Bag/Given   vancomycin  (VANCOREADY) IVPB 1750 mg/350 mL 1,750 mg 175 mL/hr   06/21/24 1804 New Bag/Given   ceFEPIme  (MAXIPIME ) 2 g in sodium chloride  0.9 % 100 mL IVPB 2 g 200 mL/hr   06/22/24 0819 New Bag/Given   ceFEPIme  (MAXIPIME ) 2 g in sodium chloride  0.9 % 100 mL IVPB 2 g 200 mL/hr       enoxaparin  (LOVENOX ) injection  0.5 mg/kg Subcutaneous Q24H   furosemide   40 mg Oral BID   gabapentin   100 mg Oral  TID   hydrOXYzine   10 mg Oral QHS   insulin  aspart  0-5 Units Subcutaneous QHS   insulin  aspart  0-9 Units Subcutaneous TID WC   insulin  aspart  3 Units Subcutaneous TID WC   insulin  glargine-yfgn  25 Units Subcutaneous Daily   metoprolol  succinate  12.5 mg Oral BID   nystatin  cream   Topical BID   ranolazine   500 mg Oral BID   rosuvastatin   40 mg Oral Daily   tamsulosin   0.4 mg Oral Daily    Objective: Vital signs in last 24 hours: Temp:  [97.9 F (36.6 C)-98.7 F (37.1 C)] 98 F (36.7 C) (12/10 1220) Pulse Rate:  [69-93] 93 (12/10 1220) Resp:  [12-20] 17 (12/10 1220) BP: (102-165)/(50-82) 120/82 (12/10 1220) SpO2:  [93 %-100 %] 98 % (12/10 1220) Constitutional: He is sitting up in a chair HENT: anicteric Mouth/Throat: Oropharynx is clear and moist. No oropharyngeal exudate.  Cardiovascular: Normal rate, regular rhythm and normal heart sounds. 2/6 sm   Pulmonary/Chest: Effort normal and breath sounds normal. No respiratory distress. He has no wheezes.  Abdominal: Soft. Bowel sounds are normal. He exhibits no distension. There is no tenderness.  Lymphadenopathy:  He has no cervical adenopathy.  Neurological: He is awake and interactive Skin: Skin is warm and  dry. No rash noted. No erythema.  Psychiatric: He has a normal mood and affect. His behavior is normal.   Lab Results Recent Labs    06/21/24 0402 06/22/24 0418  WBC 3.9* 3.7*  HGB 9.3* 9.2*  HCT 29.3* 28.0*  NA 137 136  K 3.2* 3.6  CL 100 103  CO2 24 22  BUN 19 15  CREATININE 0.99 0.89    Microbiology: Results for orders placed or performed during the hospital encounter of 06/18/24  Blood Culture (routine x 2)     Status: Abnormal   Collection Time: 06/18/24  3:58 AM   Specimen: BLOOD  Result Value Ref Range Status   Specimen Description   Final    BLOOD BLOOD LEFT ARM Performed at [facility], [address] [place], [place] [address]    Special Requests   Final    BOTTLES DRAWN AEROBIC  AND ANAEROBIC Blood Culture adequate volume Performed at [facility], [address] [place], [place] [address]    Culture  Setup Time   Final    GRAM POSITIVE COCCI AEROBIC BOTTLE ONLY CRITICAL VALUE NOTED.  VALUE IS CONSISTENT WITH PREVIOUSLY REPORTED AND CALLED VALUE. Performed at [facility], [address] [place], [place] [address]    Culture STAPHYLOCOCCUS CAPITIS (A)  Final   Report Status 06/22/2024 FINAL  Final   Organism ID, Bacteria STAPHYLOCOCCUS CAPITIS  Final      Susceptibility   Staphylococcus capitis - MIC*    CIPROFLOXACIN  <=0.5 SENSITIVE Sensitive     ERYTHROMYCIN <=0.25 SENSITIVE Sensitive     GENTAMICIN  <=0.5 SENSITIVE Sensitive     OXACILLIN <=0.25 SENSITIVE Sensitive     TETRACYCLINE <=1 SENSITIVE Sensitive     VANCOMYCIN  <=0.5 SENSITIVE Sensitive     TRIMETH /SULFA  <=10 SENSITIVE Sensitive     CLINDAMYCIN <=0.25 SENSITIVE Sensitive     RIFAMPIN <=0.5 SENSITIVE Sensitive     Inducible Clindamycin NEGATIVE Sensitive     * STAPHYLOCOCCUS CAPITIS  Blood Culture (routine x 2)     Status: Abnormal   Collection Time: 06/18/24  3:58 AM   Specimen: BLOOD  Result Value Ref Range Status   Specimen Description   Final    BLOOD BLOOD RIGHT ARM Performed at [facility], [address], [place], [place] [address]    Special Requests   Final    BOTTLES DRAWN AEROBIC AND ANAEROBIC Blood Culture adequate volume Performed at [facility], [address] [place], [place] [address]    Culture  Setup Time   Final    GRAM POSITIVE COCCI IN BOTH AEROBIC AND ANAEROBIC BOTTLES CRITICAL RESULT CALLED TO, READ BACK BY AND VERIFIED WITH: JASON ROBBINS, PHARMD @0411  06/19/2024 COP    Culture (A)  Final    STAPHYLOCOCCUS LUGDUNENSIS STAPHYLOCOCCUS EPIDERMIDIS STAPHYLOCOCCUS CAPITIS SUSCEPTIBILITIES PERFORMED ON PREVIOUS CULTURE WITHIN THE LAST 5 DAYS. Performed at [facility], [address] [place], [place] [address]     Report Status 06/22/2024 FINAL  Final   Organism ID, Bacteria STAPHYLOCOCCUS LUGDUNENSIS  Final   Organism ID, Bacteria STAPHYLOCOCCUS EPIDERMIDIS  Final      Susceptibility   Staphylococcus epidermidis - MIC*    CIPROFLOXACIN  <=0.5 SENSITIVE Sensitive     ERYTHROMYCIN <=0.25 SENSITIVE Sensitive     GENTAMICIN  <=0.5 SENSITIVE Sensitive     OXACILLIN <=0.25 SENSITIVE Sensitive     TETRACYCLINE <=1 SENSITIVE Sensitive     VANCOMYCIN  1 SENSITIVE Sensitive     TRIMETH /SULFA  <=10 SENSITIVE  Sensitive     CLINDAMYCIN <=0.25 SENSITIVE Sensitive     RIFAMPIN <=0.5 SENSITIVE Sensitive     Inducible Clindamycin NEGATIVE Sensitive     * STAPHYLOCOCCUS EPIDERMIDIS   Staphylococcus lugdunensis - MIC*    CIPROFLOXACIN  <=0.5 SENSITIVE Sensitive     ERYTHROMYCIN <=0.25 SENSITIVE Sensitive     GENTAMICIN  <=0.5 SENSITIVE Sensitive     OXACILLIN >=4 RESISTANT Resistant     TETRACYCLINE <=1 SENSITIVE Sensitive     VANCOMYCIN  <=0.5 SENSITIVE Sensitive     TRIMETH /SULFA  <=10 SENSITIVE Sensitive     CLINDAMYCIN <=0.25 SENSITIVE Sensitive     RIFAMPIN <=0.5 SENSITIVE Sensitive     Inducible Clindamycin NEGATIVE Sensitive     * STAPHYLOCOCCUS LUGDUNENSIS  Resp panel by RT-PCR (RSV, Flu A&B, Covid) Urine, Clean Catch     Status: None   Collection Time: 06/18/24  3:58 AM   Specimen: Urine, Clean Catch; Nasal Swab  Result Value Ref Range Status   SARS Coronavirus 2 by RT PCR NEGATIVE NEGATIVE Final    Comment: (NOTE) SARS-CoV-2 target nucleic acids are NOT DETECTED.  The SARS-CoV-2 RNA is generally detectable in upper respiratory specimens during the acute phase of infection. The lowest concentration of SARS-CoV-2 viral copies this assay can detect is 138 copies/mL. A negative result does not preclude SARS-Cov-2 infection and should not be used as the sole basis for treatment or other patient management decisions. A negative result may occur with  improper specimen collection/handling, submission of  specimen other than nasopharyngeal swab, presence of viral mutation(s) within the areas targeted by this assay, and inadequate number of viral copies(<138 copies/mL). A negative result must be combined with clinical observations, patient history, and epidemiological information. The expected result is Negative.  Fact Sheet for Patients:  bloggercourse.com  Fact Sheet for Healthcare Providers:  seriousbroker.it  This test is no t yet approved or cleared by the [place]  FDA and  has been authorized for detection and/or diagnosis of SARS-CoV-2 by FDA under an Emergency Use Authorization (EUA). This EUA will remain  in effect (meaning this test can be used) for the duration of the COVID-19 declaration under Section 564(b)(1) of the Act, 21 U.S.C.section 360bbb-3(b)(1), unless the authorization is terminated  or revoked sooner.       Influenza A by PCR NEGATIVE NEGATIVE Final   Influenza B by PCR NEGATIVE NEGATIVE Final    Comment: (NOTE) The Xpert Xpress SARS-CoV-2/FLU/RSV plus assay is intended as an aid in the diagnosis of influenza from Nasopharyngeal swab specimens and should not be used as a sole basis for treatment. Nasal washings and aspirates are unacceptable for Xpert Xpress SARS-CoV-2/FLU/RSV testing.  Fact Sheet for Patients: bloggercourse.com  Fact Sheet for Healthcare Providers: seriousbroker.it  This test is not yet approved or cleared by the [place]  FDA and has been authorized for detection and/or diagnosis of SARS-CoV-2 by FDA under an Emergency Use Authorization (EUA). This EUA will remain in effect (meaning this test can be used) for the duration of the COVID-19 declaration under Section 564(b)(1) of the Act, 21 U.S.C. section 360bbb-3(b)(1), unless the authorization is terminated or revoked.     Resp Syncytial Virus by PCR NEGATIVE NEGATIVE Final     Comment: (NOTE) Fact Sheet for Patients: bloggercourse.com  Fact Sheet for Healthcare Providers: seriousbroker.it  This test is not yet approved or cleared by the [place]  FDA and has been authorized for detection and/or diagnosis of SARS-CoV-2 by FDA under an Emergency Use Authorization (EUA). This EUA will remain  in effect (meaning this test can be used) for the duration of the COVID-19 declaration under Section 564(b)(1) of the Act, 21 U.S.C. section 360bbb-3(b)(1), unless the authorization is terminated or revoked.  Performed at [facility], [address] [place], [place] [address]   Urine Culture     Status: Abnormal   Collection Time: 06/18/24  3:58 AM   Specimen: Urine, Random  Result Value Ref Range Status   Specimen Description   Final    URINE, RANDOM Performed at [facility], [address] [place], [place] [address]    Special Requests   Final    NONE Reflexed from [id number] Performed at [facility], [address] [place], [place] [address]    Culture >=100,000 COLONIES/mL KLEBSIELLA AEROGENES (A)  Final   Report Status 06/22/2024 FINAL  Final   Organism ID, Bacteria KLEBSIELLA AEROGENES (A)  Final      Susceptibility   Klebsiella aerogenes - MIC*    CEFEPIME  <=0.12 SENSITIVE Sensitive     ERTAPENEM <=0.12 SENSITIVE Sensitive     CEFTRIAXONE  <=0.25 SENSITIVE Sensitive     CIPROFLOXACIN  <=0.06 SENSITIVE Sensitive     GENTAMICIN  <=1 SENSITIVE Sensitive     NITROFURANTOIN  128 RESISTANT Resistant     TRIMETH /SULFA  <=20 SENSITIVE Sensitive     PIP/TAZO Value in next row Sensitive      <=4 SENSITIVEThis is a modified FDA-approved test that has been validated and its performance characteristics determined by the reporting laboratory.  This laboratory is certified under the Clinical Laboratory Improvement Amendments CLIA as qualified to perform high complexity clinical  laboratory testing.    MEROPENEM Value in next row Sensitive      <=4 SENSITIVEThis is a modified FDA-approved test that has been validated and its performance characteristics determined by the reporting laboratory.  This laboratory is certified under the Clinical Laboratory Improvement Amendments CLIA as qualified to perform high complexity clinical laboratory testing.    * >=100,000 COLONIES/mL KLEBSIELLA AEROGENES  Blood Culture ID Panel (Reflexed)     Status: Abnormal   Collection Time: 06/18/24  3:58 AM  Result Value Ref Range Status   Enterococcus faecalis NOT DETECTED NOT DETECTED Final   Enterococcus Faecium NOT DETECTED NOT DETECTED Final   Listeria monocytogenes NOT DETECTED NOT DETECTED Final   Staphylococcus species DETECTED (A) NOT DETECTED Final    Comment: CRITICAL RESULT CALLED TO, READ BACK BY AND VERIFIED WITH: JASON ROBBINS, PHARMD @0411  06/19/2024 COP    Staphylococcus aureus (BCID) NOT DETECTED NOT DETECTED Final   Staphylococcus epidermidis DETECTED (A) NOT DETECTED Final    Comment: CRITICAL RESULT CALLED TO, READ BACK BY AND VERIFIED WITH: JASON ROBBINS, PHARMD @0411  06/19/2024 COP    Staphylococcus lugdunensis NOT DETECTED NOT DETECTED Final   Streptococcus species NOT DETECTED NOT DETECTED Final   Streptococcus agalactiae NOT DETECTED NOT DETECTED Final   Streptococcus pneumoniae NOT DETECTED NOT DETECTED Final   Streptococcus pyogenes NOT DETECTED NOT DETECTED Final   A.calcoaceticus-baumannii NOT DETECTED NOT DETECTED Final   Bacteroides fragilis NOT DETECTED NOT DETECTED Final   Enterobacterales NOT DETECTED NOT DETECTED Final   Enterobacter cloacae complex NOT DETECTED NOT DETECTED Final   Escherichia coli NOT DETECTED NOT DETECTED Final   Klebsiella aerogenes NOT DETECTED NOT DETECTED Final   Klebsiella oxytoca NOT DETECTED NOT DETECTED Final   Klebsiella pneumoniae NOT DETECTED NOT DETECTED Final   Proteus species NOT DETECTED NOT DETECTED Final    Salmonella species NOT DETECTED NOT DETECTED Final  Serratia marcescens NOT DETECTED NOT DETECTED Final   Haemophilus influenzae NOT DETECTED NOT DETECTED Final   Neisseria meningitidis NOT DETECTED NOT DETECTED Final   Pseudomonas aeruginosa NOT DETECTED NOT DETECTED Final   Stenotrophomonas maltophilia NOT DETECTED NOT DETECTED Final   Candida albicans NOT DETECTED NOT DETECTED Final   Candida auris NOT DETECTED NOT DETECTED Final   Candida glabrata NOT DETECTED NOT DETECTED Final   Candida krusei NOT DETECTED NOT DETECTED Final   Candida parapsilosis NOT DETECTED NOT DETECTED Final   Candida tropicalis NOT DETECTED NOT DETECTED Final   Cryptococcus neoformans/gattii NOT DETECTED NOT DETECTED Final   Methicillin resistance mecA/C NOT DETECTED NOT DETECTED Final    Comment: Performed at [facility], [address] [place], [place] [address]  C Difficile Quick Screen w PCR reflex     Status: None   Collection Time: 06/18/24 12:53 PM   Specimen: STOOL  Result Value Ref Range Status   C Diff antigen NEGATIVE NEGATIVE Final   C Diff toxin NEGATIVE NEGATIVE Final   C Diff interpretation No C. difficile detected.  Final    Comment: Performed at [facility], [address] [place], [place] [address]  Gastrointestinal Panel by PCR , Stool     Status: None   Collection Time: 06/19/24 12:53 PM   Specimen: Stool  Result Value Ref Range Status   Campylobacter species NOT DETECTED NOT DETECTED Final   Plesimonas shigelloides NOT DETECTED NOT DETECTED Final   Salmonella species NOT DETECTED NOT DETECTED Final   Yersinia enterocolitica NOT DETECTED NOT DETECTED Final   Vibrio species NOT DETECTED NOT DETECTED Final   Vibrio cholerae NOT DETECTED NOT DETECTED Final   Enteroaggregative E coli (EAEC) NOT DETECTED NOT DETECTED Final   Enteropathogenic E coli (EPEC) NOT DETECTED NOT DETECTED Final   Enterotoxigenic E coli (ETEC) NOT DETECTED NOT DETECTED Final   Shiga like  toxin producing E coli (STEC) NOT DETECTED NOT DETECTED Final   Shigella/Enteroinvasive E coli (EIEC) NOT DETECTED NOT DETECTED Final   Cryptosporidium NOT DETECTED NOT DETECTED Final   Cyclospora cayetanensis NOT DETECTED NOT DETECTED Final   Entamoeba histolytica NOT DETECTED NOT DETECTED Final   Giardia lamblia NOT DETECTED NOT DETECTED Final   Adenovirus F40/41 NOT DETECTED NOT DETECTED Final   Astrovirus NOT DETECTED NOT DETECTED Final   Norovirus GI/GII NOT DETECTED NOT DETECTED Final   Rotavirus A NOT DETECTED NOT DETECTED Final   Sapovirus (I, II, IV, and V) NOT DETECTED NOT DETECTED Final    Comment: Performed at [facility], [address] [place], [place] [address]  Culture, blood (Routine X 2) w Reflex to ID Panel     Status: None (Preliminary result)   Collection Time: 06/20/24  5:30 AM   Specimen: BLOOD  Result Value Ref Range Status   Specimen Description BLOOD BLOOD RIGHT ARM  Final   Special Requests   Final    BOTTLES DRAWN AEROBIC AND ANAEROBIC Blood Culture adequate volume   Culture   Final    NO GROWTH 2 DAYS Performed at [facility], [address] [place], [place] [address]    Report Status PENDING  Incomplete  Culture, blood (Routine X 2) w Reflex to ID Panel     Status: None (Preliminary result)   Collection Time: 06/20/24  5:36 AM   Specimen: BLOOD  Result Value Ref Range Status   Specimen Description BLOOD BLOOD RIGHT HAND  Final   Special Requests  Final    BOTTLES DRAWN AEROBIC AND ANAEROBIC Blood Culture adequate volume   Culture   Final    NO GROWTH 2 DAYS Performed at [facility], [address] [place], [place] [address]    Report Status PENDING  Incomplete    Studies/Results: No results found.  Assessment/Plan: Jonathan Cross. is a 78 y.o. male with multiple medical issues admitted after a fall and found to have UTI and mixed gram positive bacteremia.    Recommendations UTI- UA > 50 wbc. UCX  Kleb aerogenes Would treat with 5 more days of bactrim  or cipro    Mixed gram positive bacteremia - staph capitis, staph epi and staph lugdeneiss on one set and staph capitis on second set I suspect all contaminant. TEE negative  Can stop vancomycin   Would repeat BCX x 2 sets in 1 week after dc   ID will sign off. Please call with questions   Jonathan Cross   06/22/2024, 12:47 PM

## 2024-06-22 NOTE — Progress Notes (Addendum)
 PROGRESS NOTE    Jonathan Cross.  FMW:[id number] DOB: 1945-10-25 DOA: 06/18/2024 PCP: Roxanna Rocks, PA   Brief Narrative:   78 year old male with PMHx of CAD s/p CABG x 2 (01/2016), HFpEF, severe aortic stenosis s/p bioprosthetic aortic valve replacement, bacterial endocarditis, paroxysmal A-fib, MDS, HTN, HLD, T2DM, OSA, BPH, remote CVA x 2, who presented to the ED on 06/18/2024 after falling at home with head trauma but no LOC and being unable to get up. Patient reports generalized weakness with cough, shortness of breath, and difficulty urinating over the past few days.  CXR showed patchy left perihilar and retrocardiac airspace infiltrates suspicious for pneumonia CT head shows no acute traumatic abnormality, and new age-indeterminate right occipital infarct, remote left parietal infarct, and moderate chronic microvascular ischemic changes  CT C-spine showed no acute traumatic abnormality, multilevel chronic degenerative changes with mild to moderate central canal stenosis at C3-C4 and C6-C7 post multilevel foraminal stenoses, and bilateral carotid bulb atherosclerotic calcifications CT chest abdomen pelvis showed diffuse bilateral patchy/hazy pulmonary opacities consistent with infectious/inflammatory process, cholelithiasis without biliary dilation, benign bilateral renal cysts, and extensive atherosclerotic calcifications with prior aortic valve repair  Being treated for sepsis and bacteremia, ID consulted. TTE unremarkable.  TEE done on 12/9 showed no obvious vegetations. Needs SNF on discharge.  Assessment & Plan:  Principal Problem:   Severe sepsis (HCC) Active Problems:   Uncontrolled type 2 diabetes mellitus with hyperglycemia, with long-term current use of insulin  (HCC)   Paroxysmal atrial fibrillation/A-flutter (HCC)   Status post coronary artery bypass graft   Benign prostatic hyperplasia   Essential hypertension   OSA (obstructive sleep apnea)   S/P aortic valve  replacement   Hyperlipidemia LDL goal <70   GERD (gastroesophageal reflux disease)   CAP (community acquired pneumonia)   Sepsis secondary to UTI (HCC)   Fall at home, initial encounter   Occipital stroke (HCC)    Sepsis, resolved. - Met sepsis criteria on admission with tachycardia to 104, tachypnea to 22, febrile to 102.3 F per EMS (98.7 F on arrival after receiving acetaminophen  by EMS), leukocytosis to 16.3.  Lactic acid elevated 2.1.  Pneumonia and UTI likely source of infection, later found to have staph epi bacteremia - Sepsis protocol initiated in the ED - Management of infectious processes as below   Mixed gram positive bacteremia - staph capitis and staph lugdeneiss on 2 different cxs and biofire with staph epi-Likley contaminant Ruled out IE - BCx (12/6) positive for Staph epidermidis - TTE (12/7) without evidence of vegetation - He was on IV Vanc and Cefepime . Discussed with Dr Epifanio on 12/10 and  discontinued both vanc and cefeime - Repeat blood cultures 12/8 -NGTD -Need surveillance blood cultures in a week. -TEE done on 12/9 showed no obvious vegetations.   Acute hypoxic respiratory failure - resolved - Presented with several days of shortness of breath and cough for several days, with SpO2 88% when trialed on RA in the ED, with improvement to 95% on 2 L Drew. - Likely secondary to CAP - Patient is currently on room air.   Possible bacterial CAP - Presenting with cough and shortness of breath, hypoxic to 88% on RA - Procalcitonin 0.41 - CT chest with diffuse bilateral patchy/hazy pulmonary opacities consistent with infectious/inflammatory process - He was on IV Cefepime  and Vancomycin , dced on 12/10.   Acute uncomplicated UTI secondary to Klebsiella,POA: - UA consistent with UTI - started on oral bactrim  x 5 days after talking to Dr Epifanio  on 12/10.   Ruled out acute stroke - CT head showed new age-indeterminate right occipital infarct with remote left  parietal infarct - MRI brain was ordered to delineate chronicity, noted no intracranial abnormalities, remote left parietal and right occipital infarcts   Fall Generalized Weakness/physical deconditioning: - Progressive generalized weakness leading to fall and inability to get up, likely secondary to acute illness given above mentioned infectious etiologies  - PT/OT evaluated patient and recommended SNF placement - TOC consulted - Fall precautions   CAD s/p CABG x 2  - LHC (06/2022) showed patent LIMA to LAD and vein graft to second obtuse marginal, otherwise moderate, diffuse CAD - Continue Toprol -XL 12.5 mg twice daily, Ranexa  500 mg twice daily, Crestor  40 mg daily   Chronic HFpEF,POA: NYHA III - TTE (08/2023) showed LVEF 65-70%, mild LVH, mildly reduced RV systolic function, trivial MR - TTE (06/19/2024) showed LVEF 60-65%, no RWMA, no LVH, G2 DD. - Continue Toprol -XL and p.o. Lasix  40 mg twice daily   Paroxysmal A-fib - Continue Toprol -XL 25 mg daily - Not on anticoagulation given history of falls, prior rectal bleeding, and anemia in the setting of mild dysplastic syndrome   Diarrhea - Patient reports multiple episodes of loose stools both at home and while currently hospitalized - GIPP and C. 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 kg/m) - Obesity is clinically significant and contributes to T2DM, HTN, HLD, HFpEF, CAD, AFib, and OSA.  - Recommend continued discussion with PCP re: lifestyle modification including dietary changes, regular physical  activity, and weight reduction strategies.   Disposition: [employer], likely on 12/11    DVT prophylaxis:      Code Status: Full Code Family Communication:  None at the bedside Status is: Inpatient Remains inpatient appropriate because: pending SNF placement    Subjective:  No acute events overnight, TEE done yesterday showed no valvular vegetations.   Examination:  General exam: Appears calm and comfortable  Respiratory system: Clear to auscultation. Respiratory effort normal. Cardiovascular system: S1 & S2 heard, RRR. No JVD, murmurs, rubs, gallops or clicks. No pedal edema. Gastrointestinal system: Abdomen is nondistended, soft and nontender. No organomegaly or masses felt. Normal bowel sounds heard. Central nervous system: Alert and oriented. No focal neurological deficits. Extremities: Symmetric 5 x 5 power. Skin: No rashes, lesions or ulcers   Diet Orders (From admission, onward)     Start     Ordered   06/21/24 1751  DIET DYS 3 Room service appropriate? Yes; Fluid consistency: Thin  Diet effective now       Question Answer Comment  Room service appropriate? Yes   Fluid consistency: Thin      06/21/24 1751            Objective: Vitals:   06/21/24 2323 06/22/24 0402 06/22/24 0432 06/22/24 0743  BP: (!) 124/50 (!) 165/68 130/70 121/62  Pulse: 80 69  74  Resp: 19 20    Temp: 98.4 F (36.9 C) 97.9 F (36.6 C)  98.4 F (36.9 C)  TempSrc:    Oral  SpO2: 97% 100%  94%  Weight:      Height:        Intake/Output Summary (Last 24 hours) at 06/22/2024 1207 Last data filed at 06/21/2024 2300 Gross per 24 hour  Intake 500 ml  Output 1300 ml  Net -800 ml   Filed Weights   06/18/24 0511  Weight: 117.9 kg    Scheduled Meds:  enoxaparin  (LOVENOX ) injection  0.5 mg/kg Subcutaneous Q24H   furosemide   40 mg Oral BID   gabapentin   100 mg Oral TID   hydrOXYzine   10 mg Oral QHS   insulin  aspart  0-5 Units Subcutaneous QHS   insulin  aspart  0-9 Units  Subcutaneous TID WC   insulin  aspart  3 Units Subcutaneous TID WC   insulin  glargine-yfgn  25 Units Subcutaneous Daily   metoprolol  succinate  12.5 mg Oral BID   nystatin  cream   Topical BID   ranolazine   500 mg Oral BID   rosuvastatin   40 mg Oral Daily   tamsulosin   0.4 mg Oral Daily   Continuous Infusions:  ceFEPime  (MAXIPIME ) IV 2 g (06/22/24 0819)   vancomycin  1,750 mg (06/21/24 1441)    Nutritional status     Body mass index is 35.26 kg/m.  Data Reviewed:   CBC: Recent Labs  Lab 06/18/24 0358 06/19/24 0613 06/20/24 0530 06/21/24 0402 06/22/24 0418  WBC 16.3* 9.6 5.1 3.9* 3.7*  NEUTROABS 13.5*  --   --   --   --   HGB 9.3* 9.3* 9.2* 9.3* 9.2*  HCT 28.9* 29.8* 29.1* 29.3* 28.0*  MCV 113.8* 115.1* 115.0* 114.9* 111.1*  PLT 140* 131* 136* 141* 160   Basic Metabolic Panel: Recent Labs  Lab 06/18/24 1229 06/19/24 0613 06/19/24 1438 06/20/24 0530 06/21/24 0402 06/22/24 0418  NA 135  --  134* 139 137 136  K 3.8  --  3.5 3.4* 3.2* 3.6  CL 102  --  101 103 100 103  CO2 19*  --  22 25 24 22   GLUCOSE 190*  --  210* 137* 135* 165*  BUN 13  --  16 16 19 15   CREATININE 1.02  --  1.09 1.04 0.99 0.89  CALCIUM  8.5*  --  8.2* 8.5* 8.0* 8.2*  MG  --  1.8  --   --   --   --    GFR: Estimated Creatinine Clearance: 87.7 mL/min (by C-G formula based on SCr of 0.89 mg/dL). Liver Function Tests: Recent Labs  Lab 06/18/24 0358 06/18/24 1229  AST 20 20  ALT 14 12  ALKPHOS 59 56  BILITOT 0.9 0.8  PROT 7.7 7.4  ALBUMIN 3.6 3.4*   No results for input(s): LIPASE, AMYLASE in the last 168 hours. No results for input(s): AMMONIA in the last 168 hours. Coagulation Profile: Recent Labs  Lab 06/18/24 0358  INR 1.3*   Cardiac Enzymes: No results for input(s): CKTOTAL, CKMB, CKMBINDEX, TROPONINI in the last 168 hours. BNP (last 3 results) No results for input(s): PROBNP in the last 8760 hours. HbA1C: No results for input(s): HGBA1C in the last 72  hours. CBG: Recent Labs  Lab 06/21/24 0819 06/21/24 1137 06/21/24 1645 06/21/24 2102 06/22/24 0739  GLUCAP 142* 145* 129* 205* 143*   Lipid Profile: No results for input(s): CHOL, HDL, LDLCALC, TRIG, CHOLHDL, LDLDIRECT in the last 72 hours.  Thyroid Function Tests: No results for input(s): TSH, T4TOTAL, FREET4, T3FREE, THYROIDAB in the last 72 hours. Anemia Panel: No  results for input(s): VITAMINB12, FOLATE, FERRITIN, TIBC, IRON , RETICCTPCT in the last 72 hours. Sepsis Labs: Recent Labs  Lab 06/18/24 0358 06/18/24 0604 06/18/24 1229  PROCALCITON  --   --  0.41  LATICACIDVEN 2.1* 1.7  --     Recent Results (from the past 240 hours)  Blood Culture (routine x 2)     Status: Abnormal   Collection Time: 06/18/24  3:58 AM   Specimen: BLOOD  Result Value Ref Range Status   Specimen Description   Final    BLOOD BLOOD LEFT ARM Performed at [facility], [address] [place], [place] [address]    Special Requests   Final    BOTTLES DRAWN AEROBIC AND ANAEROBIC Blood Culture adequate volume Performed at [facility], [address], [place], [place] [address]    Culture  Setup Time   Final    GRAM POSITIVE COCCI AEROBIC BOTTLE ONLY CRITICAL VALUE NOTED.  VALUE IS CONSISTENT WITH PREVIOUSLY REPORTED AND CALLED VALUE. Performed at [facility], [address] [place], [place] [address]    Culture STAPHYLOCOCCUS CAPITIS (A)  Final   Report Status 06/22/2024 FINAL  Final   Organism ID, Bacteria STAPHYLOCOCCUS CAPITIS  Final      Susceptibility   Staphylococcus capitis - MIC*    CIPROFLOXACIN  <=0.5 SENSITIVE Sensitive     ERYTHROMYCIN <=0.25 SENSITIVE Sensitive     GENTAMICIN  <=0.5 SENSITIVE Sensitive     OXACILLIN <=0.25 SENSITIVE Sensitive     TETRACYCLINE <=1 SENSITIVE Sensitive     VANCOMYCIN  <=0.5 SENSITIVE Sensitive     TRIMETH /SULFA  <=10 SENSITIVE Sensitive     CLINDAMYCIN <=0.25 SENSITIVE Sensitive      RIFAMPIN <=0.5 SENSITIVE Sensitive     Inducible Clindamycin NEGATIVE Sensitive     * STAPHYLOCOCCUS CAPITIS  Blood Culture (routine x 2)     Status: Abnormal   Collection Time: 06/18/24  3:58 AM   Specimen: BLOOD  Result Value Ref Range Status   Specimen Description   Final    BLOOD BLOOD RIGHT ARM Performed at [facility], [address] [place], [place] [address]    Special Requests   Final    BOTTLES DRAWN AEROBIC AND ANAEROBIC Blood Culture adequate volume Performed at [facility], [address] [place], [place] [address]    Culture  Setup Time   Final    GRAM POSITIVE COCCI IN BOTH AEROBIC AND ANAEROBIC BOTTLES CRITICAL RESULT CALLED TO, READ BACK BY AND VERIFIED WITH: JASON ROBBINS, PHARMD @0411  06/19/2024 COP    Culture (A)  Final    STAPHYLOCOCCUS LUGDUNENSIS STAPHYLOCOCCUS EPIDERMIDIS STAPHYLOCOCCUS CAPITIS SUSCEPTIBILITIES PERFORMED ON PREVIOUS CULTURE WITHIN THE LAST 5 DAYS. Performed at [facility], [address] [place], [place] [address]    Report Status 06/22/2024 FINAL  Final   Organism ID, Bacteria STAPHYLOCOCCUS LUGDUNENSIS  Final   Organism ID, Bacteria STAPHYLOCOCCUS EPIDERMIDIS  Final      Susceptibility   Staphylococcus epidermidis - MIC*    CIPROFLOXACIN  <=0.5 SENSITIVE Sensitive     ERYTHROMYCIN <=0.25 SENSITIVE Sensitive     GENTAMICIN  <=0.5 SENSITIVE Sensitive     OXACILLIN <=0.25 SENSITIVE Sensitive     TETRACYCLINE <=1 SENSITIVE Sensitive     VANCOMYCIN  1 SENSITIVE Sensitive     TRIMETH /SULFA  <=10 SENSITIVE Sensitive     CLINDAMYCIN <=0.25 SENSITIVE Sensitive     RIFAMPIN <=0.5 SENSITIVE Sensitive     Inducible Clindamycin NEGATIVE Sensitive     * STAPHYLOCOCCUS EPIDERMIDIS  Staphylococcus lugdunensis - MIC*    CIPROFLOXACIN  <=0.5 SENSITIVE Sensitive     ERYTHROMYCIN <=0.25 SENSITIVE Sensitive     GENTAMICIN  <=0.5 SENSITIVE Sensitive     OXACILLIN >=4 RESISTANT Resistant     TETRACYCLINE <=1 SENSITIVE  Sensitive     VANCOMYCIN  <=0.5 SENSITIVE Sensitive     TRIMETH /SULFA  <=10 SENSITIVE Sensitive     CLINDAMYCIN <=0.25 SENSITIVE Sensitive     RIFAMPIN <=0.5 SENSITIVE Sensitive     Inducible Clindamycin NEGATIVE Sensitive     * STAPHYLOCOCCUS LUGDUNENSIS  Resp panel by RT-PCR (RSV, Flu A&B, Covid) Urine, Clean Catch     Status: None   Collection Time: 06/18/24  3:58 AM   Specimen: Urine, Clean Catch; Nasal Swab  Result Value Ref Range Status   SARS Coronavirus 2 by RT PCR NEGATIVE NEGATIVE Final    Comment: (NOTE) SARS-CoV-2 target nucleic acids are NOT DETECTED.  The SARS-CoV-2 RNA is generally detectable in upper respiratory specimens during the acute phase of infection. The lowest concentration of SARS-CoV-2 viral copies this assay can detect is 138 copies/mL. A negative result does not preclude SARS-Cov-2 infection and should not be used as the sole basis for treatment or other patient management decisions. A negative result may occur with  improper specimen collection/handling, submission of specimen other than nasopharyngeal swab, presence of viral mutation(s) within the areas targeted by this assay, and inadequate number of viral copies(<138 copies/mL). A negative result must be combined with clinical observations, patient history, and epidemiological information. The expected result is Negative.  Fact Sheet for Patients:  bloggercourse.com  Fact Sheet for Healthcare Providers:  seriousbroker.it  This test is no t yet approved or cleared by the [place]  FDA and  has been authorized for detection and/or diagnosis of SARS-CoV-2 by FDA under an Emergency Use Authorization (EUA). This EUA will remain  in effect (meaning this test can be used) for the duration of the COVID-19 declaration under Section 564(b)(1) of the Act, 21 U.S.C.section 360bbb-3(b)(1), unless the authorization is terminated  or revoked sooner.        Influenza A by PCR NEGATIVE NEGATIVE Final   Influenza B by PCR NEGATIVE NEGATIVE Final    Comment: (NOTE) The Xpert Xpress SARS-CoV-2/FLU/RSV plus assay is intended as an aid in the diagnosis of influenza from Nasopharyngeal swab specimens and should not be used as a sole basis for treatment. Nasal washings and aspirates are unacceptable for Xpert Xpress SARS-CoV-2/FLU/RSV testing.  Fact Sheet for Patients: bloggercourse.com  Fact Sheet for Healthcare Providers: seriousbroker.it  This test is not yet approved or cleared by the [place]  FDA and has been authorized for detection and/or diagnosis of SARS-CoV-2 by FDA under an Emergency Use Authorization (EUA). This EUA will remain in effect (meaning this test can be used) for the duration of the COVID-19 declaration under Section 564(b)(1) of the Act, 21 U.S.C. section 360bbb-3(b)(1), unless the authorization is terminated or revoked.     Resp Syncytial Virus by PCR NEGATIVE NEGATIVE Final    Comment: (NOTE) Fact Sheet for Patients: bloggercourse.com  Fact Sheet for Healthcare Providers: seriousbroker.it  This test is not yet approved or cleared by the [place]  FDA and has been authorized for detection and/or diagnosis of SARS-CoV-2 by FDA under an Emergency Use Authorization (EUA). This EUA will remain in effect (meaning this test can be used) for the duration of the COVID-19 declaration under Section 564(b)(1) of the Act, 21 U.S.C. section 360bbb-3(b)(1), unless the authorization is terminated or revoked.  Performed  at [facility], [address] [place], [place] [address]   Urine Culture     Status: Abnormal   Collection Time: 06/18/24  3:58 AM   Specimen: Urine, Random  Result Value Ref Range Status   Specimen Description   Final    URINE, RANDOM Performed at [facility], [address] [place], [place] [address]    Special Requests   Final    NONE Reflexed from [id number] Performed at [facility], [address] [place], [place] [address]    Culture >=100,000 COLONIES/mL KLEBSIELLA AEROGENES (A)  Final   Report Status 06/22/2024 FINAL  Final   Organism ID, Bacteria KLEBSIELLA AEROGENES (A)  Final      Susceptibility   Klebsiella aerogenes - MIC*    CEFEPIME  <=0.12 SENSITIVE Sensitive     ERTAPENEM <=0.12 SENSITIVE Sensitive     CEFTRIAXONE  <=0.25 SENSITIVE Sensitive     CIPROFLOXACIN  <=0.06 SENSITIVE Sensitive     GENTAMICIN  <=1 SENSITIVE Sensitive     NITROFURANTOIN  128 RESISTANT Resistant     TRIMETH /SULFA  <=20 SENSITIVE Sensitive     PIP/TAZO Value in next row Sensitive      <=4 SENSITIVEThis is a modified FDA-approved test that has been validated and its performance characteristics determined by the reporting laboratory.  This laboratory is certified under the Clinical Laboratory Improvement Amendments CLIA as qualified to perform high complexity clinical laboratory testing.    MEROPENEM Value in next row Sensitive      <=4 SENSITIVEThis is a modified FDA-approved test that has been validated and its performance characteristics determined by the reporting laboratory.  This laboratory is certified under the Clinical Laboratory Improvement Amendments CLIA as qualified to perform high complexity clinical laboratory testing.    * >=100,000 COLONIES/mL KLEBSIELLA AEROGENES  Blood Culture ID Panel (Reflexed)     Status: Abnormal   Collection Time: 06/18/24  3:58 AM  Result Value Ref Range Status   Enterococcus faecalis NOT DETECTED NOT DETECTED Final   Enterococcus Faecium NOT DETECTED NOT DETECTED Final   Listeria monocytogenes NOT DETECTED NOT DETECTED Final   Staphylococcus species DETECTED (A) NOT DETECTED Final    Comment: CRITICAL RESULT CALLED TO, READ BACK BY AND VERIFIED WITH: JASON ROBBINS, PHARMD @0411  06/19/2024 COP    Staphylococcus aureus  (BCID) NOT DETECTED NOT DETECTED Final   Staphylococcus epidermidis DETECTED (A) NOT DETECTED Final    Comment: CRITICAL RESULT CALLED TO, READ BACK BY AND VERIFIED WITH: JASON ROBBINS, PHARMD @0411  06/19/2024 COP    Staphylococcus lugdunensis NOT DETECTED NOT DETECTED Final   Streptococcus species NOT DETECTED NOT DETECTED Final   Streptococcus agalactiae NOT DETECTED NOT DETECTED Final   Streptococcus pneumoniae NOT DETECTED NOT DETECTED Final   Streptococcus pyogenes NOT DETECTED NOT DETECTED Final   A.calcoaceticus-baumannii NOT DETECTED NOT DETECTED Final   Bacteroides fragilis NOT DETECTED NOT DETECTED Final   Enterobacterales NOT DETECTED NOT DETECTED Final   Enterobacter cloacae complex NOT DETECTED NOT DETECTED Final   Escherichia coli NOT DETECTED NOT DETECTED Final   Klebsiella aerogenes NOT DETECTED NOT DETECTED Final   Klebsiella oxytoca NOT DETECTED NOT DETECTED Final   Klebsiella pneumoniae NOT DETECTED NOT DETECTED Final   Proteus species NOT DETECTED NOT DETECTED Final   Salmonella species NOT DETECTED NOT DETECTED Final   Serratia marcescens NOT DETECTED NOT DETECTED Final   Haemophilus influenzae NOT DETECTED NOT DETECTED Final   Neisseria meningitidis NOT DETECTED NOT DETECTED Final   Pseudomonas aeruginosa NOT DETECTED NOT DETECTED  Final   Stenotrophomonas maltophilia NOT DETECTED NOT DETECTED Final   Candida albicans NOT DETECTED NOT DETECTED Final   Candida auris NOT DETECTED NOT DETECTED Final   Candida glabrata NOT DETECTED NOT DETECTED Final   Candida krusei NOT DETECTED NOT DETECTED Final   Candida parapsilosis NOT DETECTED NOT DETECTED Final   Candida tropicalis NOT DETECTED NOT DETECTED Final   Cryptococcus neoformans/gattii NOT DETECTED NOT DETECTED Final   Methicillin resistance mecA/C NOT DETECTED NOT DETECTED Final    Comment: Performed at [facility], [address] [place], [place] [address]  C Difficile Quick Screen w PCR reflex      Status: None   Collection Time: 06/18/24 12:53 PM   Specimen: STOOL  Result Value Ref Range Status   C Diff antigen NEGATIVE NEGATIVE Final   C Diff toxin NEGATIVE NEGATIVE Final   C Diff interpretation No C. difficile detected.  Final    Comment: Performed at [facility], [address] [place], [place] [address]  Gastrointestinal Panel by PCR , Stool     Status: None   Collection Time: 06/19/24 12:53 PM   Specimen: Stool  Result Value Ref Range Status   Campylobacter species NOT DETECTED NOT DETECTED Final   Plesimonas shigelloides NOT DETECTED NOT DETECTED Final   Salmonella species NOT DETECTED NOT DETECTED Final   Yersinia enterocolitica NOT DETECTED NOT DETECTED Final   Vibrio species NOT DETECTED NOT DETECTED Final   Vibrio cholerae NOT DETECTED NOT DETECTED Final   Enteroaggregative E coli (EAEC) NOT DETECTED NOT DETECTED Final   Enteropathogenic E coli (EPEC) NOT DETECTED NOT DETECTED Final   Enterotoxigenic E coli (ETEC) NOT DETECTED NOT DETECTED Final   Shiga like toxin producing E coli (STEC) NOT DETECTED NOT DETECTED Final   Shigella/Enteroinvasive E coli (EIEC) NOT DETECTED NOT DETECTED Final   Cryptosporidium NOT DETECTED NOT DETECTED Final   Cyclospora cayetanensis NOT DETECTED NOT DETECTED Final   Entamoeba histolytica NOT DETECTED NOT DETECTED Final   Giardia lamblia NOT DETECTED NOT DETECTED Final   Adenovirus F40/41 NOT DETECTED NOT DETECTED Final   Astrovirus NOT DETECTED NOT DETECTED Final   Norovirus GI/GII NOT DETECTED NOT DETECTED Final   Rotavirus A NOT DETECTED NOT DETECTED Final   Sapovirus (I, II, IV, and V) NOT DETECTED NOT DETECTED Final    Comment: Performed at [facility], [address] [place], [place] [address]  Culture, blood (Routine X 2) w Reflex to ID Panel     Status: None (Preliminary result)   Collection Time: 06/20/24  5:30 AM   Specimen: BLOOD  Result Value Ref Range Status   Specimen Description BLOOD BLOOD  RIGHT ARM  Final   Special Requests   Final    BOTTLES DRAWN AEROBIC AND ANAEROBIC Blood Culture adequate volume   Culture   Final    NO GROWTH 2 DAYS Performed at [facility], [address] [place], [place] [address]    Report Status PENDING  Incomplete  Culture, blood (Routine X 2) w Reflex to ID Panel     Status: None (Preliminary result)   Collection Time: 06/20/24  5:36 AM   Specimen: BLOOD  Result Value Ref Range Status   Specimen Description BLOOD BLOOD RIGHT HAND  Final   Special Requests   Final    BOTTLES DRAWN AEROBIC AND ANAEROBIC Blood Culture adequate volume   Culture   Final    NO GROWTH 2 DAYS Performed at [facility], [address], [place], [place] [address]    Report Status PENDING  Incomplete         Radiology Studies: No results found.         LOS: 4 days   Time spent= 35 mins    Deliliah Room, MD [facility]  If 7PM-7AM, please contact night-coverage  06/22/2024, 12:07 PM

## 2024-06-22 NOTE — Progress Notes (Signed)
 Occupational Therapy Treatment Patient Details Name: Jonathan Cross. MRN: [id number] DOB: [birth year] Today's Date: 06/22/2024   History of present illness Pt is an 78 year old male presented to the ED after a fall at home, admitted with UTI, sepsis, CAP, c-diff, and stroke work up. Per MRI impression: no acute intracranial abnormality, remote left parietal and right occipital infarcts. PMH significant for diabetes, HTN, GERD, HLD, 2 of bacterial endocarditis, CAD s/p aortic valve replacement, A-fib not on anticoagulation, recurrent skin abscesses, history of MRSA.   OT comments  Patient seen for OT treatment on this date. Upon arrival to room patient resting in bed, agreeable to treatment. States he wants to get on bedpan, OT encouraged BSC, patient agreeable. Transitioned to EOB with min A; impulsive throughout tx and required max cues/prompts for pacing, SPT from EOB to [facility], unable to void given increased time; able to perform 50% of perineal hygiene with increased time, required some A. Performed SPT from [facility] to recliner, NT present to assist with catheter management.  Patient ended treatment in recliner with bed/chair alarm on and all needs within reach. Patient making good progress toward goals, will continue to follow POC. Discharge recommendation remains appropriate.        If plan is discharge home, recommend the following:  A lot of help with bathing/dressing/bathroom;A lot of help with walking and/or transfers   Equipment Recommendations  Other (comment)    Recommendations for Other Services      Precautions / Restrictions Precautions Precautions: Fall Recall of Precautions/Restrictions: Impaired Restrictions Weight Bearing Restrictions Per Provider Order: No       Mobility Bed Mobility Overal bed mobility: Needs Assistance       Supine to sit: HOB elevated, Used rails, Min assist     General bed mobility comments: pulled up on OT even with cues to use  handrails    Transfers Overall transfer level: Needs assistance Equipment used: Rolling walker (2 wheels) Transfers: Sit to/from Stand Sit to Stand: Min assist           General transfer comment: cues for safety/impulsivity     Balance Overall balance assessment: Needs assistance Sitting-balance support: Feet supported Sitting balance-Leahy Scale: Good     Standing balance support: Single extremity supported, During functional activity, Reliant on assistive device for balance Standing balance-Leahy Scale: Fair                             ADL either performed or assessed with clinical judgement   ADL Overall ADL's : Needs assistance/impaired                         Toilet Transfer: Minimal assistance;BSC/3in1;Rolling walker (2 wheels) Toilet Transfer Details (indicate cue type and reason): cues for sequencing/impulsivity Toileting- Clothing Manipulation and Hygiene: Minimal assistance;Sit to/from stand Toileting - Clothing Manipulation Details (indicate cue type and reason): A for perineal hygiene     Functional mobility during ADLs: Minimal assistance;Rolling walker (2 wheels) General ADL Comments: cues for safety, impulsive throughout, poor safety awareness noted    Extremity/Trunk Assessment              Vision       Perception     Praxis     Communication Communication Communication: Impaired Factors Affecting Communication: Hearing impaired   Cognition Arousal: Alert Behavior During Therapy: Anxious, Impulsive Cognition: Cognition impaired   Orientation impairments: Situation Awareness: Intellectual  awareness impaired Memory impairment (select all impairments): Short-term memory Attention impairment (select first level of impairment): Sustained attention Executive functioning impairment (select all impairments): Reasoning, Problem solving                   Following commands: Impaired Following commands impaired:  Follows one step commands with increased time      Cueing   Cueing Techniques: Verbal cues, Tactile cues, Visual cues  Exercises      Shoulder Instructions       General Comments noted rash on groin, notified RN    Pertinent Vitals/ Pain       Pain Assessment Pain Assessment: No/denies pain  Home Living                                          Prior Functioning/Environment              Frequency  Min 2X/week        Progress Toward Goals  OT Goals(current goals can now be found in the care plan section)  Progress towards OT goals: Progressing toward goals  Acute Rehab OT Goals Patient Stated Goal: to go home OT Goal Formulation: With patient Time For Goal Achievement: 07/02/24 Potential to Achieve Goals: Fair ADL Goals Pt Will Perform Grooming: with modified independence;sitting;standing Pt Will Perform Lower Body Dressing: with modified independence;sitting/lateral leans;sit to/from stand Pt Will Transfer to Toilet: with modified independence;ambulating Pt Will Perform Toileting - Clothing Manipulation and hygiene: with modified independence;sitting/lateral leans;sit to/from stand  Plan      Co-evaluation                 AM-PAC OT 6 Clicks Daily Activity     Outcome Measure   Help from another person eating meals?: A Little Help from another person taking care of personal grooming?: A Little Help from another person toileting, which includes using toliet, bedpan, or urinal?: A Lot Help from another person bathing (including washing, rinsing, drying)?: A Lot Help from another person to put on and taking off regular upper body clothing?: A Little Help from another person to put on and taking off regular lower body clothing?: A Lot 6 Click Score: 15    End of Session Equipment Utilized During Treatment: Oxygen;Rolling walker (2 wheels)  OT Visit Diagnosis: Other abnormalities of gait and mobility (R26.89);Muscle weakness  (generalized) (M62.81)   Activity Tolerance Patient tolerated treatment well   Patient Left in chair;with call bell/phone within reach;with chair alarm set;with nursing/sitter in room   Nurse Communication Mobility status        Time: 8842-8784 OT Time Calculation (min): 18 min  Charges: OT General Charges $OT Visit: 1 Visit OT Treatments $Self Care/Home Management : 8-22 mins  Rogers Clause, OT/L MSOT, 06/22/2024

## 2024-06-22 NOTE — Progress Notes (Signed)
 PT Cancellation Note  Patient Details Name: Jonathan Cross. MRN: [id number] DOB: 08-09-1945   Cancelled Treatment:    Reason Eval/Treat Not Completed: Fatigue/lethargy limiting ability to participate Patient received in recliner. He states he should walk, but is too tired. Does not want to get back into bed yet either. Will re-attempt at later date.    Katya Rolston 06/22/2024, 3:12 PM

## 2024-06-23 DIAGNOSIS — A419 Sepsis, unspecified organism: Secondary | ICD-10-CM | POA: Diagnosis not present

## 2024-06-23 DIAGNOSIS — R6520 Severe sepsis without septic shock: Secondary | ICD-10-CM | POA: Diagnosis not present

## 2024-06-23 LAB — CBC
HCT: 27.5 % — ABNORMAL LOW (ref 39.0–52.0)
Hemoglobin: 8.8 g/dL — ABNORMAL LOW (ref 13.0–17.0)
MCH: 36.4 pg — ABNORMAL HIGH (ref 26.0–34.0)
MCHC: 32 g/dL (ref 30.0–36.0)
MCV: 113.6 fL — ABNORMAL HIGH (ref 80.0–100.0)
Platelets: 159 K/uL (ref 150–400)
RBC: 2.42 MIL/uL — ABNORMAL LOW (ref 4.22–5.81)
RDW: 15.3 % (ref 11.5–15.5)
WBC: 5 K/uL (ref 4.0–10.5)
nRBC: 0 % (ref 0.0–0.2)

## 2024-06-23 LAB — BASIC METABOLIC PANEL WITH GFR
Anion gap: 11 (ref 5–15)
BUN: 15 mg/dL (ref 8–23)
CO2: 25 mmol/L (ref 22–32)
Calcium: 8.1 mg/dL — ABNORMAL LOW (ref 8.9–10.3)
Chloride: 99 mmol/L (ref 98–111)
Creatinine, Ser: 1 mg/dL (ref 0.61–1.24)
GFR, Estimated: >60 mL/min (ref >60)
Glucose, Bld: 157 mg/dL — ABNORMAL HIGH (ref 70–99)
Potassium: 3.4 mmol/L — ABNORMAL LOW (ref 3.5–5.1)
Sodium: 135 mmol/L (ref 135–145)

## 2024-06-23 LAB — GLUCOSE, CAPILLARY
Glucose-Capillary: 173 mg/dL — ABNORMAL HIGH (ref 70–99)
Glucose-Capillary: 289 mg/dL — ABNORMAL HIGH (ref 70–99)

## 2024-06-23 MED ORDER — SULFAMETHOXAZOLE-TRIMETHOPRIM 800-160 MG PO TABS: 1.0000 | ORAL_TABLET | Freq: Two times a day (BID) | ORAL | Status: AC

## 2024-06-23 MED ORDER — LOPERAMIDE HCL 2 MG PO CAPS: 2.0000 mg | ORAL_CAPSULE | Freq: Four times a day (QID) | ORAL | Status: AC | PRN

## 2024-06-23 MED ORDER — IPRATROPIUM-ALBUTEROL 0.5-2.5 (3) MG/3ML IN SOLN: 3.0000 mL | RESPIRATORY_TRACT | Status: AC | PRN

## 2024-06-23 MED ORDER — FUROSEMIDE 40 MG PO TABS: 40.0000 mg | ORAL_TABLET | Freq: Two times a day (BID) | ORAL | Status: AC

## 2024-06-23 MED ORDER — BENZONATATE 100 MG PO CAPS: 100.0000 mg | ORAL_CAPSULE | Freq: Three times a day (TID) | ORAL | Status: AC | PRN

## 2024-06-23 NOTE — Hospital Course (Signed)
 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   HPI: Patient falling at home with head trauma but no LOC and being unable to get up. reports generalized weakness with cough, shortness of breath, and difficulty urinating over the past few days.   12/06: admitted to hospitalist w/ possible CVA, sepsis d/t UTI and pneumonia. MRI r/o acute CVA.  12/07: (+)staph epid on BCx, hx endocarditis, TTE no vegetation seen, likely will need TEE, ID consulted.  12/08: BCx repeated. Per ID BCx may all be contaminant but will get TEE to confirm no vegetation  12/09: UCx (+)klebsiella. TEE negative. Suspect mixed gram pos bacteremia is all contaminant.  12/10: pend SNF placement  12/11 ***     Consultants:  Infectious disease Cardiology    Procedures/Surgeries: 12/09: TEE      ASSESSMENT & PLAN:   Sepsis, resolved. - Met sepsis criteria on admission with tachycardia to 104, tachypnea to 22, febrile to 102.3 F per EMS (98.7 F on arrival after receiving acetaminophen  by EMS), leukocytosis to 16.3.  Lactic acid elevated 2.1.  Pneumonia and UTI likely source of infection, later found to have staph epi bacteremia - Sepsis protocol initiated in the ED Management of infectious processes as below   Mixed gram positive bacteremia - staph capitis and staph lugdeneiss on 2 different cxs and biofire with staph epi-Likley contaminant per ID Ruled out endocarditis - BCx (12/6) positive for Staph epidermidis - TTE (12/7) without evidence of vegetation - He was on IV Vanc  and Cefepime . Discussed with Dr Epifanio on 12/10 and  discontinued both vanc and cefeime - Repeat blood cultures 12/8 -NGTD -TEE done on 12/9 showed no obvious vegetations. Ok for dc on 5 more days of bactrim  or cipro   repeat BCX x 2 sets in 1 week after dc    Acute hypoxic respiratory failure - resolved - Presented with several days of shortness of breath and cough for several days, with SpO2 88% when trialed on RA in the ED, with improvement to 95% on 2 L [place]. - Likely secondary to CAP - Patient is currently on room air. Monitor SPO2 as needed   Possible bacterial CAP - Presenting with cough and shortness of breath, hypoxic to 88% on RA - Procalcitonin 0.41 - CT chest with diffuse bilateral patchy/hazy pulmonary opacities consistent with infectious/inflammatory process - He was on IV Cefepime  and Vancomycin , dced on 12/10. Ok for dc on 5 more days of bactrim  or cipro     Acute uncomplicated UTI secondary to [place]: - UA consistent with UTI - started on oral bactrim  x 5 days after talking to Dr Epifanio on 12/10. Ok for dc on 5 more days of bactrim  or cipro     Ruled out acute stroke - CT head showed new age-indeterminate right occipital infarct with remote left parietal infarct - MRI brain was ordered to delineate chronicity, noted no intracranial abnormalities, remote left parietal and right occipital infarcts Recheck as needed for mental status change / neuro deficit    Fall Generalized Weakness/physical deconditioning: - Progressive generalized weakness leading to fall and  inability to get up, likely secondary to acute illness given above mentioned infectious etiologies  - PT/OT evaluated patient and recommended SNF placement - TOC consulted - Fall precautions PT/OT at rehac   CAD s/p CABG x 2  - LHC (06/2022) showed patent LIMA to LAD and vein graft to second obtuse marginal, otherwise moderate, diffuse CAD Continue Toprol -XL 12.5 mg twice daily, Ranexa  500 mg  twice daily, Crestor  40 mg daily   Chronic HFpEF,POA: NYHA III - TTE (08/2023) showed LVEF 65-70%, mild LVH, mildly reduced RV systolic function, trivial MR - TTE (06/19/2024) showed LVEF 60-65%, no RWMA, no LVH, G2 DD. Continue Toprol -XL and p.o. Lasix  40 mg twice daily   Paroxysmal A-fib Continue Toprol -XL 25 mg daily Not on anticoagulation given history of falls, prior rectal bleeding, and anemia in the setting of mild dysplastic syndrome   Diarrhea - Patient reports multiple episodes of loose stools both at home and while currently hospitalized - GIPP and C. 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  Underweight - under 18  overweight - 25 to 29 obese - 30 or more Class 1 obesity: BMI of 30.0 to 34 Class 2 obesity: BMI of 35.0 to 39 Class 3 obesity: BMI of 40.0 to 49 Super Morbid Obesity: BMI 50-59 Super-super Morbid Obesity: BMI 60+ Healthy nutrition and physical activity advised as adjunct to other disease management and risk reduction treatments    DVT prophylaxis: *** IV fluids: *** continuous IV fluids  Nutrition: *** Central lines / other devices: ***  Code Status: *** ACP documentation reviewed: *** none on file in [facility]  TOC needs: *** Medical barriers to dispo: ***. Expected medical readiness for discharge ***.

## 2024-06-23 NOTE — Plan of Care (Signed)
°  Problem: Education: Goal: Knowledge of General Education information will improve Description: Including pain rating scale, medication(s)/side effects and non-pharmacologic comfort measures Outcome: Progressing   Problem: Health Behavior/Discharge Planning: Goal: Ability to manage health-related needs will improve Outcome: Progressing   Problem: Clinical Measurements: Goal: Ability to maintain clinical measurements within normal limits will improve Outcome: Progressing Goal: Will remain free from infection Outcome: Progressing Goal: Diagnostic test results will improve Outcome: Progressing Goal: Respiratory complications will improve Outcome: Progressing Goal: Cardiovascular complication will be avoided Outcome: Progressing   Problem: Activity: Goal: Risk for activity intolerance will decrease Outcome: Progressing   Problem: Nutrition: Goal: Adequate nutrition will be maintained Outcome: Progressing   Problem: Coping: Goal: Level of anxiety will decrease Outcome: Progressing   Problem: Elimination: Goal: Will not experience complications related to bowel motility Outcome: Progressing Goal: Will not experience complications related to urinary retention Outcome: Progressing   Problem: Pain Managment: Goal: General experience of comfort will improve and/or be controlled Outcome: Progressing   Problem: Safety: Goal: Ability to remain free from injury will improve Outcome: Progressing   Problem: Skin Integrity: Goal: Risk for impaired skin integrity will decrease Outcome: Progressing   Problem: Activity: Goal: Ability to tolerate increased activity will improve Outcome: Progressing   Problem: Clinical Measurements: Goal: Ability to maintain a body temperature in the normal range will improve Outcome: Progressing   Problem: Respiratory: Goal: Ability to maintain adequate ventilation will improve Outcome: Progressing Goal: Ability to maintain a clear airway  will improve Outcome: Progressing   Problem: Education: Goal: Ability to describe self-care measures that may prevent or decrease complications (Diabetes Survival Skills Education) will improve Outcome: Progressing Goal: Individualized Educational Video(s) Outcome: Progressing   Problem: Coping: Goal: Ability to adjust to condition or change in health will improve Outcome: Progressing   Problem: Fluid Volume: Goal: Ability to maintain a balanced intake and output will improve Outcome: Progressing   Problem: Health Behavior/Discharge Planning: Goal: Ability to identify and utilize available resources and services will improve Outcome: Progressing Goal: Ability to manage health-related needs will improve Outcome: Progressing   Problem: Metabolic: Goal: Ability to maintain appropriate glucose levels will improve Outcome: Progressing   Problem: Nutritional: Goal: Maintenance of adequate nutrition will improve Outcome: Progressing Goal: Progress toward achieving an optimal weight will improve Outcome: Progressing   Problem: Skin Integrity: Goal: Risk for impaired skin integrity will decrease Outcome: Progressing   Problem: Tissue Perfusion: Goal: Adequacy of tissue perfusion will improve Outcome: Progressing   Problem: Education: Goal: Knowledge of disease or condition will improve Outcome: Progressing Goal: Knowledge of secondary prevention will improve (MUST DOCUMENT ALL) Outcome: Progressing Goal: Knowledge of patient specific risk factors will improve (DELETE if not current risk factor) Outcome: Progressing   Problem: Ischemic Stroke/TIA Tissue Perfusion: Goal: Complications of ischemic stroke/TIA will be minimized Outcome: Progressing   Problem: Coping: Goal: Will verbalize positive feelings about self Outcome: Progressing Goal: Will identify appropriate support needs Outcome: Progressing   Problem: Health Behavior/Discharge Planning: Goal: Ability to  manage health-related needs will improve Outcome: Progressing Goal: Goals will be collaboratively established with patient/family Outcome: Progressing   Problem: Self-Care: Goal: Ability to participate in self-care as condition permits will improve Outcome: Progressing Goal: Verbalization of feelings and concerns over difficulty with self-care will improve Outcome: Progressing Goal: Ability to communicate needs accurately will improve Outcome: Progressing   Problem: Nutrition: Goal: Risk of aspiration will decrease Outcome: Progressing Goal: Dietary intake will improve Outcome: Progressing

## 2024-06-23 NOTE — Progress Notes (Signed)
 Report given to Jorja, RN at [facility] # [phone]

## 2024-06-23 NOTE — TOC Transition Note (Signed)
 Transition of Care [facility]) - Discharge Note   Patient Details  Name: Jonathan Cross. MRN: [id number] Date of Birth: 1946-02-01  [facility] CM/SW Contact:  Victory Jackquline RAMAN, RN Phone Number: 06/23/2024, 12:24 PM   Clinical Narrative:    12:05 pm: Patient discharging to [facility] Rm# [phone]. Nurse to call report to [phone]. RNCM called patient's daughter Jonathan Cross @ [phone], introduced role and explained that discharge planning would be discussed. I formed her that her father was discharging to [facility] and needs transportation. He doesn't qualify for [facility] to transport and if they transported him and the insurance didn't pay then they would get a bill for the transportation. She verbalized understanding. She said that she just sent her brother a text message as well.  12:16 pm: RNCM called patient's son Jonathan Cross @ [phone], left a message and sent him a text message. Awaiting a call back.  12:23 pm: RNCM contacted Jonathan Cross again to let her know that I had reached out to her brother and had to leave a voicemail and she said that she will get back to me to lert me know who will be picking him up. Bedside nurse made aware. No further concerns. RNCM signing off.     Final next level of care: Skilled Nursing Facility Barriers to Discharge: Barriers Resolved   Patient Goals and CMS Choice            Discharge Placement                Patient to be transferred to facility by: Family Member Name of family member notified: Jonathan Cross Patient and family notified of of transfer: 06/23/24  Discharge Plan and Services Additional resources added to the After Visit Summary for                                       Social Drivers of Health (SDOH) Interventions SDOH Screenings   Food Insecurity: No Food Insecurity (06/18/2024)  Housing: Unknown (06/18/2024)  Transportation Needs: No Transportation Needs (06/18/2024)  Utilities: Not At  Risk (06/18/2024)  Financial Resource Strain: Low Risk  (03/22/2024)   Received from [facility]  Social Connections: Socially Isolated (06/18/2024)  Tobacco Use: Medium Risk (03/31/2024)  Health Literacy: High Risk (02/25/2023)   Received from [facility]     Readmission Risk Interventions     No data to display

## 2024-06-23 NOTE — Discharge Summary (Signed)
 Physician Discharge Summary   Patient: Jonathan Cross. MRN: [id number]  DOB: 1946/04/01   Admit:     Date of Admission: 06/18/2024 Admitted from: home   Discharge: Date of discharge: 06/23/2024 Disposition: Skilled nursing facility Condition at discharge: good  CODE STATUS: FULL CODE     Discharge Physician: Laneta Blunt, DO [facility]     PCP: Roxanna Rocks, PA  Recommendations for Outpatient Follow-up:  Follow up with PCP Minor, Mason, PA in 1-2 weeks    Discharge Instructions     Increase activity slowly   Complete by: As directed          Discharge Diagnoses: Principal Problem:   Severe sepsis (HCC) Active Problems:   Uncontrolled type 2 diabetes mellitus with hyperglycemia, with long-term current use of insulin  (HCC)   Paroxysmal atrial fibrillation/A-flutter (HCC)   Status post coronary artery bypass graft   Benign prostatic hyperplasia   Essential hypertension   OSA (obstructive sleep apnea)   S/P aortic valve replacement   Hyperlipidemia LDL goal <70   GERD (gastroesophageal reflux disease)   CAP (community acquired pneumonia)   Sepsis secondary to UTI (HCC)   Fall at home, initial encounter   Occipital stroke [facility])       Hospital course / significant events:   78 year old male with PMHx of CAD s/p CABG x 2 (01/2016), HFpEF, severe aortic stenosis s/p bioprosthetic aortic valve replacement, 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   HPI: Patient falling at home with head trauma but no LOC and being unable to get up. reports generalized weakness with cough, shortness of breath, and difficulty urinating over the past few days.   12/06: admitted to  hospitalist w/ possible CVA, sepsis d/t UTI and pneumonia. MRI r/o acute CVA.  12/07: (+)staph epid on BCx, hx endocarditis, TTE no vegetation seen, likely will need TEE, ID consulted.  12/08: BCx repeated. Per ID BCx may all be contaminant but will get TEE to confirm no vegetation  12/09: UCx (+)klebsiella. TEE negative. Suspect mixed gram pos bacteremia is all contaminant.  12/10: pend SNF placement  12/11 remains stable for dc to SNF      Consultants:  Infectious disease Cardiology    Procedures/Surgeries: 12/09: TEE      ASSESSMENT & PLAN:   Sepsis, resolved. - Met sepsis criteria on admission with tachycardia to 104, tachypnea to 22, febrile to 102.3 F per EMS (98.7 F on arrival after receiving acetaminophen  by EMS), leukocytosis to 16.3.  Lactic acid elevated 2.1.  Pneumonia and UTI likely source of infection, later found to have staph epi bacteremia - Sepsis protocol initiated in the ED Management of infectious processes as below   Mixed gram positive bacteremia - staph capitis and staph lugdeneiss on 2 different cxs and biofire with staph epi-Likley contaminant per ID Ruled out endocarditis - BCx (12/6) positive for Staph epidermidis - TTE (12/7) without evidence of vegetation - He was on IV Vanc and Cefepime . Discussed with Dr Epifanio on 12/10 and  discontinued both vanc and cefeime - Repeat blood cultures 12/8 -NGTD -TEE done on 12/9 showed no obvious vegetations. Ok for dc on 4 more days of bactrim   repeat BCX x 2 sets  in 1 week after dc    Acute hypoxic respiratory failure - resolved - Presented with several days of shortness of breath and cough for several days, with SpO2 88% when trialed on RA in the ED, with improvement to 95% on 2 L [place]. - Likely secondary to CAP - Patient is currently on room air. Monitor SPO2 as needed O2 as needed keep SpO2 >90   Possible bacterial CAP - Presenting with cough and shortness of breath, hypoxic to 88% on RA -  Procalcitonin 0.41 - CT chest with diffuse bilateral patchy/hazy pulmonary opacities consistent with infectious/inflammatory process - He was on IV Cefepime  and Vancomycin , dced on 12/10. Ok for dc on 4 more days of bactrim    Acute uncomplicated UTI secondary to [place]: - UA consistent with UTI - started on oral bactrim  x 5 days after talking to Dr Epifanio on 12/10. Ok for dc on 4 more days of bactrim     Ruled out acute stroke - CT head showed new age-indeterminate right occipital infarct with remote left parietal infarct - MRI brain was ordered to delineate chronicity, noted no intracranial abnormalities, remote left parietal and right occipital infarcts Recheck as needed for mental status change / neuro deficit    Fall Generalized Weakness/physical deconditioning: - Progressive generalized weakness leading to fall and inability to get up, likely secondary to acute illness given above mentioned infectious etiologies  - PT/OT evaluated patient and recommended SNF placement - TOC consulted - Fall precautions PT/OT at rehac   CAD s/p CABG x 2  - LHC (06/2022) showed patent LIMA to LAD and vein graft to second obtuse marginal, otherwise moderate, diffuse CAD Continue Toprol -XL 12.5 mg twice daily, Ranexa  500 mg twice daily, Crestor  40 mg daily   Chronic HFpEF,POA: NYHA III - TTE (08/2023) showed LVEF 65-70%, mild LVH, mildly reduced RV systolic function, trivial MR - TTE (06/19/2024) showed LVEF 60-65%, no RWMA, no LVH, G2 DD. Continue Toprol -XL and p.o. Lasix  40 mg twice daily   Paroxysmal A-fib Continue Toprol -XL 25 mg daily Not on anticoagulation given history of falls, prior rectal bleeding, and anemia in the setting of mild dysplastic syndrome   Diarrhea - Patient reports multiple episodes of loose stools both at home and while currently hospitalized - GIPP and C. 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  Underweight - under 18  overweight - 25 to 29 obese - 30 or more Class 1 obesity: BMI of 30.0 to 34 Class 2 obesity: BMI of 35.0 to 39 Class 3 obesity: BMI of 40.0 to 49 Super Morbid Obesity: BMI 50-59 Super-super Morbid Obesity: BMI 60+ Healthy nutrition and physical activity advised as adjunct to other disease management and risk reduction treatments           Discharge Instructions  Allergies as of 06/23/2024  Reactions   Metformin Diarrhea, Anaphylaxis   Loose stools even with XR        Medication List     STOP taking these medications    insulin  NPH-regular Human (70-30) 100 UNIT/ML injection   Stool Softener/Laxative 50-8.6 MG tablet Generic drug: senna-docusate       TAKE these medications    Acetaminophen  Extra Strength 500 MG Tabs Take 2 tablets (1,000 mg total) by mouth every 8 (eight) hours.   benzonatate  100 MG capsule Commonly known as: TESSALON  Take 1 capsule (100 mg total) by mouth 3 (three) times daily as needed for cough.   clotrimazole-betamethasone cream Commonly known as: LOTRISONE Apply 1 Application topically 2 (two) times daily.   furosemide  40 MG tablet Commonly known as: LASIX  Take 1 tablet (40 mg total) by mouth 2 (two) times daily. What changed: additional instructions   gabapentin  100 MG capsule Commonly known as:  NEURONTIN  Take 100 mg by mouth 3 (three) times daily.   hydrOXYzine  10 MG tablet Commonly known as: ATARAX  Take 10 mg by mouth at bedtime.   insulin  glargine-yfgn 100 UNIT/ML injection Commonly known as: SEMGLEE  Inject 0.25 mLs (25 Units total) into the skin daily. Start taking on: June 24, 2024   ipratropium-albuterol  0.5-2.5 (3) MG/3ML Soln Commonly known as: DUONEB Take 3 mLs by nebulization every 4 (four) hours as needed.   ketoconazole 2 % shampoo Commonly known as: NIZORAL Apply 1 Application topically 2 (two) times a week.   ketoconazole 2 % cream Commonly known as: NIZORAL Apply 1 Application topically 2 (two) times daily.   loperamide  2 MG capsule Commonly known as: IMODIUM  Take 1 capsule (2 mg total) by mouth every 6 (six) hours as needed for diarrhea or loose stools.   meclizine  12.5 MG tablet Commonly known as: ANTIVERT  Take 12.5 mg by mouth every 8 (eight) hours as needed.   metoprolol  succinate 25 MG 24 hr tablet Commonly known as: TOPROL -XL Take 12.5 mg by mouth 2 (two) times daily.   nitroGLYCERIN  0.4 MG SL tablet Commonly known as: NITROSTAT  Place 1 tablet (0.4 mg total) under the tongue every 5 (five) minutes x 3 doses as needed for chest pain.   omeprazole  20 MG capsule Commonly known as: PRILOSEC Take 2 capsules (40 mg total) by mouth daily.   polyethylene glycol powder 17 GM/SCOOP powder Commonly known as: GLYCOLAX /MIRALAX  Take 17 g by mouth daily.   potassium chloride  SA 20 MEQ tablet Commonly known as: KLOR-CON  M Take 20 mEq by mouth daily.   ranolazine  500 MG 12 hr tablet Commonly known as: RANEXA  Take 1 tablet by mouth twice daily   rosuvastatin  40 MG tablet Commonly known as: CRESTOR  Take 40 mg by mouth daily.   sulfamethoxazole -trimethoprim  800-160 MG tablet Commonly known as: BACTRIM  DS Take 1 tablet by mouth every 12 (twelve) hours for 4 days.   tamsulosin  0.4 MG Caps capsule Commonly known as: FLOMAX  Take 0.4 mg by  mouth daily.         Contact information for after-discharge care     Destination     [facility] .   Service: Skilled Nursing Contact information: [address] [place]  [address] [phone]                     Allergies[1]   Subjective: pt reports feeling well, still some SOB w exertion and some cough, otherwise feeling better, reports some fatigue, tolerating diet, no  N/V, no CP   Discharge Exam: BP 132/64 (BP Location: Right Arm)   Pulse 78   Temp 97.8 F (36.6 C)   Resp 16   Ht 6' (1.829 m)   Wt 117.9 kg   SpO2 91%   BMI 35.26 kg/m  General: Pt is alert, awake, not in acute distress Cardiovascular: RRR, S1/S2 +, no rubs, no gallops Respiratory: fair inspiratory effort, dimished breath sounds bilaterally but otherwise clear  Abdominal: Soft, NT, ND, bowel sounds + Extremities: no edema, no cyanosis     The results of significant diagnostics from this hospitalization (including imaging, microbiology, ancillary and laboratory) are listed below for reference.     Microbiology: Recent Results (from the past 240 hours)  Blood Culture (routine x 2)     Status: Abnormal   Collection Time: 06/18/24  3:58 AM   Specimen: BLOOD  Result Value Ref Range Status   Specimen Description   Final    BLOOD BLOOD LEFT ARM Performed at [facility], [address] [place], [place] [address]    Special Requests   Final    BOTTLES DRAWN AEROBIC AND ANAEROBIC Blood Culture adequate volume Performed at [facility], [address], [place], [place] [address]    Culture  Setup Time   Final    GRAM POSITIVE COCCI AEROBIC BOTTLE ONLY CRITICAL VALUE NOTED.  VALUE IS CONSISTENT WITH PREVIOUSLY REPORTED AND CALLED VALUE. Performed at [facility], [address] [place], [place] [address]    Culture STAPHYLOCOCCUS CAPITIS (A)  Final   Report Status  06/22/2024 FINAL  Final   Organism ID, Bacteria STAPHYLOCOCCUS CAPITIS  Final      Susceptibility   Staphylococcus capitis - MIC*    CIPROFLOXACIN  <=0.5 SENSITIVE Sensitive     ERYTHROMYCIN <=0.25 SENSITIVE Sensitive     GENTAMICIN  <=0.5 SENSITIVE Sensitive     OXACILLIN <=0.25 SENSITIVE Sensitive     TETRACYCLINE <=1 SENSITIVE Sensitive     VANCOMYCIN  <=0.5 SENSITIVE Sensitive     TRIMETH /SULFA  <=10 SENSITIVE Sensitive     CLINDAMYCIN <=0.25 SENSITIVE Sensitive     RIFAMPIN <=0.5 SENSITIVE Sensitive     Inducible Clindamycin NEGATIVE Sensitive     * STAPHYLOCOCCUS CAPITIS  Blood Culture (routine x 2)     Status: Abnormal   Collection Time: 06/18/24  3:58 AM   Specimen: BLOOD  Result Value Ref Range Status   Specimen Description   Final    BLOOD BLOOD RIGHT ARM Performed at [facility], [address] [place], [place] [address]    Special Requests   Final    BOTTLES DRAWN AEROBIC AND ANAEROBIC Blood Culture adequate volume Performed at [facility], [address] [place], [place] [address]    Culture  Setup Time   Final    GRAM POSITIVE COCCI IN BOTH AEROBIC AND ANAEROBIC BOTTLES CRITICAL RESULT CALLED TO, READ BACK BY AND VERIFIED WITH: JASON ROBBINS, PHARMD @0411  06/19/2024 COP    Culture (A)  Final    STAPHYLOCOCCUS LUGDUNENSIS STAPHYLOCOCCUS EPIDERMIDIS STAPHYLOCOCCUS CAPITIS SUSCEPTIBILITIES PERFORMED ON PREVIOUS CULTURE WITHIN THE LAST 5 DAYS. Performed at [facility], [address] [place], [place] [address]    Report Status 06/22/2024 FINAL  Final   Organism ID, Bacteria STAPHYLOCOCCUS LUGDUNENSIS  Final   Organism ID, Bacteria STAPHYLOCOCCUS EPIDERMIDIS  Final      Susceptibility   Staphylococcus epidermidis - MIC*    CIPROFLOXACIN  <=0.5 SENSITIVE Sensitive     ERYTHROMYCIN <=0.25 SENSITIVE  Sensitive     GENTAMICIN  <=0.5 SENSITIVE Sensitive     OXACILLIN <=0.25 SENSITIVE Sensitive     TETRACYCLINE <=1 SENSITIVE Sensitive      VANCOMYCIN  1 SENSITIVE Sensitive     TRIMETH /SULFA  <=10 SENSITIVE Sensitive     CLINDAMYCIN <=0.25 SENSITIVE Sensitive     RIFAMPIN <=0.5 SENSITIVE Sensitive     Inducible Clindamycin NEGATIVE Sensitive     * STAPHYLOCOCCUS EPIDERMIDIS   Staphylococcus lugdunensis - MIC*    CIPROFLOXACIN  <=0.5 SENSITIVE Sensitive     ERYTHROMYCIN <=0.25 SENSITIVE Sensitive     GENTAMICIN  <=0.5 SENSITIVE Sensitive     OXACILLIN >=4 RESISTANT Resistant     TETRACYCLINE <=1 SENSITIVE Sensitive     VANCOMYCIN  <=0.5 SENSITIVE Sensitive     TRIMETH /SULFA  <=10 SENSITIVE Sensitive     CLINDAMYCIN <=0.25 SENSITIVE Sensitive     RIFAMPIN <=0.5 SENSITIVE Sensitive     Inducible Clindamycin NEGATIVE Sensitive     * STAPHYLOCOCCUS LUGDUNENSIS  Resp panel by RT-PCR (RSV, Flu A&B, Covid) Urine, Clean Catch     Status: None   Collection Time: 06/18/24  3:58 AM   Specimen: Urine, Clean Catch; Nasal Swab  Result Value Ref Range Status   SARS Coronavirus 2 by RT PCR NEGATIVE NEGATIVE Final    Comment: (NOTE) SARS-CoV-2 target nucleic acids are NOT DETECTED.  The SARS-CoV-2 RNA is generally detectable in upper respiratory specimens during the acute phase of infection. The lowest concentration of SARS-CoV-2 viral copies this assay can detect is 138 copies/mL. A negative result does not preclude SARS-Cov-2 infection and should not be used as the sole basis for treatment or other patient management decisions. A negative result may occur with  improper specimen collection/handling, submission of specimen other than nasopharyngeal swab, presence of viral mutation(s) within the areas targeted by this assay, and inadequate number of viral copies(<138 copies/mL). A negative result must be combined with clinical observations, patient history, and epidemiological information. The expected result is Negative.  Fact Sheet for Patients:  bloggercourse.com  Fact Sheet for Healthcare Providers:   seriousbroker.it  This test is no t yet approved or cleared by the [place]  FDA and  has been authorized for detection and/or diagnosis of SARS-CoV-2 by FDA under an Emergency Use Authorization (EUA). This EUA will remain  in effect (meaning this test can be used) for the duration of the COVID-19 declaration under Section 564(b)(1) of the Act, 21 U.S.C.section 360bbb-3(b)(1), unless the authorization is terminated  or revoked sooner.       Influenza A by PCR NEGATIVE NEGATIVE Final   Influenza B by PCR NEGATIVE NEGATIVE Final    Comment: (NOTE) The Xpert Xpress SARS-CoV-2/FLU/RSV plus assay is intended as an aid in the diagnosis of influenza from Nasopharyngeal swab specimens and should not be used as a sole basis for treatment. Nasal washings and aspirates are unacceptable for Xpert Xpress SARS-CoV-2/FLU/RSV testing.  Fact Sheet for Patients: bloggercourse.com  Fact Sheet for Healthcare Providers: seriousbroker.it  This test is not yet approved or cleared by the [place]  FDA and has been authorized for detection and/or diagnosis of SARS-CoV-2 by FDA under an Emergency Use Authorization (EUA). This EUA will remain in effect (meaning this test can be used) for the duration of the COVID-19 declaration under Section 564(b)(1) of the Act, 21 U.S.C. section 360bbb-3(b)(1), unless the authorization is terminated or revoked.     Resp Syncytial Virus by PCR NEGATIVE NEGATIVE Final    Comment: (NOTE) Fact Sheet for Patients: bloggercourse.com  Fact Sheet  for Healthcare Providers: seriousbroker.it  This test is not yet approved or cleared by the [place]  FDA and has been authorized for detection and/or diagnosis of SARS-CoV-2 by FDA under an Emergency Use Authorization (EUA). This EUA will remain in effect (meaning this test can be used) for  the duration of the COVID-19 declaration under Section 564(b)(1) of the Act, 21 U.S.C. section 360bbb-3(b)(1), unless the authorization is terminated or revoked.  Performed at [facility], [address] [place], [place] [address]   Urine Culture     Status: Abnormal   Collection Time: 06/18/24  3:58 AM   Specimen: Urine, Random  Result Value Ref Range Status   Specimen Description   Final    URINE, RANDOM Performed at [facility], [address] [place], [place] [address]    Special Requests   Final    NONE Reflexed from [id number] Performed at [facility], [address] [place], [place] [address]    Culture >=100,000 COLONIES/mL KLEBSIELLA AEROGENES (A)  Final   Report Status 06/22/2024 FINAL  Final   Organism ID, Bacteria KLEBSIELLA AEROGENES (A)  Final      Susceptibility   Klebsiella aerogenes - MIC*    CEFEPIME  <=0.12 SENSITIVE Sensitive     ERTAPENEM <=0.12 SENSITIVE Sensitive     CEFTRIAXONE  <=0.25 SENSITIVE Sensitive     CIPROFLOXACIN  <=0.06 SENSITIVE Sensitive     GENTAMICIN  <=1 SENSITIVE Sensitive     NITROFURANTOIN  128 RESISTANT Resistant     TRIMETH /SULFA  <=20 SENSITIVE Sensitive     PIP/TAZO Value in next row Sensitive      <=4 SENSITIVEThis is a modified FDA-approved test that has been validated and its performance characteristics determined by the reporting laboratory.  This laboratory is certified under the Clinical Laboratory Improvement Amendments CLIA as qualified to perform high complexity clinical laboratory testing.    MEROPENEM Value in next row Sensitive      <=4 SENSITIVEThis is a modified FDA-approved test that has been validated and its performance characteristics determined by the reporting laboratory.  This laboratory is certified under the Clinical Laboratory Improvement Amendments CLIA as qualified to perform high complexity clinical laboratory testing.    * >=100,000 COLONIES/mL KLEBSIELLA AEROGENES  Blood Culture  ID Panel (Reflexed)     Status: Abnormal   Collection Time: 06/18/24  3:58 AM  Result Value Ref Range Status   Enterococcus faecalis NOT DETECTED NOT DETECTED Final   Enterococcus Faecium NOT DETECTED NOT DETECTED Final   Listeria monocytogenes NOT DETECTED NOT DETECTED Final   Staphylococcus species DETECTED (A) NOT DETECTED Final    Comment: CRITICAL RESULT CALLED TO, READ BACK BY AND VERIFIED WITH: JASON ROBBINS, PHARMD @0411  06/19/2024 COP    Staphylococcus aureus (BCID) NOT DETECTED NOT DETECTED Final   Staphylococcus epidermidis DETECTED (A) NOT DETECTED Final    Comment: CRITICAL RESULT CALLED TO, READ BACK BY AND VERIFIED WITH: JASON ROBBINS, PHARMD @0411  06/19/2024 COP    Staphylococcus lugdunensis NOT DETECTED NOT DETECTED Final   Streptococcus species NOT DETECTED NOT DETECTED Final   Streptococcus agalactiae NOT DETECTED NOT DETECTED Final   Streptococcus pneumoniae NOT DETECTED NOT DETECTED Final   Streptococcus pyogenes NOT DETECTED NOT DETECTED Final   A.calcoaceticus-baumannii NOT DETECTED NOT DETECTED Final   Bacteroides fragilis NOT DETECTED NOT DETECTED Final   Enterobacterales NOT DETECTED NOT DETECTED Final   Enterobacter cloacae complex NOT DETECTED NOT DETECTED Final   Escherichia coli NOT DETECTED NOT DETECTED Final   Klebsiella aerogenes NOT DETECTED  NOT DETECTED Final   Klebsiella oxytoca NOT DETECTED NOT DETECTED Final   Klebsiella pneumoniae NOT DETECTED NOT DETECTED Final   Proteus species NOT DETECTED NOT DETECTED Final   Salmonella species NOT DETECTED NOT DETECTED Final   Serratia marcescens NOT DETECTED NOT DETECTED Final   Haemophilus influenzae NOT DETECTED NOT DETECTED Final   Neisseria meningitidis NOT DETECTED NOT DETECTED Final   Pseudomonas aeruginosa NOT DETECTED NOT DETECTED Final   Stenotrophomonas maltophilia NOT DETECTED NOT DETECTED Final   Candida albicans NOT DETECTED NOT DETECTED Final   Candida auris NOT DETECTED NOT DETECTED Final    Candida glabrata NOT DETECTED NOT DETECTED Final   Candida krusei NOT DETECTED NOT DETECTED Final   Candida parapsilosis NOT DETECTED NOT DETECTED Final   Candida tropicalis NOT DETECTED NOT DETECTED Final   Cryptococcus neoformans/gattii NOT DETECTED NOT DETECTED Final   Methicillin resistance mecA/C NOT DETECTED NOT DETECTED Final    Comment: Performed at [facility], [address] [place], [place] [address]  C Difficile Quick Screen w PCR reflex     Status: None   Collection Time: 06/18/24 12:53 PM   Specimen: STOOL  Result Value Ref Range Status   C Diff antigen NEGATIVE NEGATIVE Final   C Diff toxin NEGATIVE NEGATIVE Final   C Diff interpretation No C. difficile detected.  Final    Comment: Performed at [facility], [address] [place], [place] [address]  Gastrointestinal Panel by PCR , Stool     Status: None   Collection Time: 06/19/24 12:53 PM   Specimen: Stool  Result Value Ref Range Status   Campylobacter species NOT DETECTED NOT DETECTED Final   Plesimonas shigelloides NOT DETECTED NOT DETECTED Final   Salmonella species NOT DETECTED NOT DETECTED Final   Yersinia enterocolitica NOT DETECTED NOT DETECTED Final   Vibrio species NOT DETECTED NOT DETECTED Final   Vibrio cholerae NOT DETECTED NOT DETECTED Final   Enteroaggregative E coli (EAEC) NOT DETECTED NOT DETECTED Final   Enteropathogenic E coli (EPEC) NOT DETECTED NOT DETECTED Final   Enterotoxigenic E coli (ETEC) NOT DETECTED NOT DETECTED Final   Shiga like toxin producing E coli (STEC) NOT DETECTED NOT DETECTED Final   Shigella/Enteroinvasive E coli (EIEC) NOT DETECTED NOT DETECTED Final   Cryptosporidium NOT DETECTED NOT DETECTED Final   Cyclospora cayetanensis NOT DETECTED NOT DETECTED Final   Entamoeba histolytica NOT DETECTED NOT DETECTED Final   Giardia lamblia NOT DETECTED NOT DETECTED Final   Adenovirus F40/41 NOT DETECTED NOT DETECTED Final   Astrovirus NOT DETECTED NOT DETECTED  Final   Norovirus GI/GII NOT DETECTED NOT DETECTED Final   Rotavirus A NOT DETECTED NOT DETECTED Final   Sapovirus (I, II, IV, and V) NOT DETECTED NOT DETECTED Final    Comment: Performed at [facility], [address] [place], [place] [address]  Culture, blood (Routine X 2) w Reflex to ID Panel     Status: None (Preliminary result)   Collection Time: 06/20/24  5:30 AM   Specimen: BLOOD  Result Value Ref Range Status   Specimen Description BLOOD BLOOD RIGHT ARM  Final   Special Requests   Final    BOTTLES DRAWN AEROBIC AND ANAEROBIC Blood Culture adequate volume   Culture   Final    NO GROWTH 3 DAYS Performed at [facility], [address] [place], [place] [address]    Report Status PENDING  Incomplete  Culture, blood (Routine X 2) w Reflex to ID Panel  Status: None (Preliminary result)   Collection Time: 06/20/24  5:36 AM   Specimen: BLOOD  Result Value Ref Range Status   Specimen Description BLOOD BLOOD RIGHT HAND  Final   Special Requests   Final    BOTTLES DRAWN AEROBIC AND ANAEROBIC Blood Culture adequate volume   Culture   Final    NO GROWTH 3 DAYS Performed at [facility], [address] [place], [place] [address]    Report Status PENDING  Incomplete     Labs: BNP (last 3 results) Recent Labs    08/25/23 1440  BNP 105.7*   Basic Metabolic Panel: Recent Labs  Lab 06/19/24 0613 06/19/24 1438 06/20/24 0530 06/21/24 0402 06/22/24 0418 06/23/24 0515  NA  --  134* 139 137 136 135  K  --  3.5 3.4* 3.2* 3.6 3.4*  CL  --  101 103 100 103 99  CO2  --  22 25 24 22 25   GLUCOSE  --  210* 137* 135* 165* 157*  BUN  --  16 16 19 15 15   CREATININE  --  1.09 1.04 0.99 0.89 1.00  CALCIUM   --  8.2* 8.5* 8.0* 8.2* 8.1*  MG 1.8  --   --   --   --   --    Liver Function Tests: Recent Labs  Lab 06/18/24 0358 06/18/24 1229  AST 20 20  ALT 14 12  ALKPHOS 59 56  BILITOT 0.9 0.8  PROT 7.7 7.4  ALBUMIN 3.6 3.4*   No results for  input(s): LIPASE, AMYLASE in the last 168 hours. No results for input(s): AMMONIA in the last 168 hours. CBC: Recent Labs  Lab 06/18/24 0358 06/19/24 0613 06/20/24 0530 06/21/24 0402 06/22/24 0418 06/23/24 0515  WBC 16.3* 9.6 5.1 3.9* 3.7* 5.0  NEUTROABS 13.5*  --   --   --   --   --   HGB 9.3* 9.3* 9.2* 9.3* 9.2* 8.8*  HCT 28.9* 29.8* 29.1* 29.3* 28.0* 27.5*  MCV 113.8* 115.1* 115.0* 114.9* 111.1* 113.6*  PLT 140* 131* 136* 141* 160 159   Cardiac Enzymes: No results for input(s): CKTOTAL, CKMB, CKMBINDEX, TROPONINI in the last 168 hours. BNP: Invalid input(s): POCBNP CBG: Recent Labs  Lab 06/22/24 0739 06/22/24 1218 06/22/24 1657 06/22/24 2047 06/23/24 0804  GLUCAP 143* 179* 240* 207* 173*   D-Dimer No results for input(s): DDIMER in the last 72 hours. Hgb A1c No results for input(s): HGBA1C in the last 72 hours. Lipid Profile No results for input(s): CHOL, HDL, LDLCALC, TRIG, CHOLHDL, LDLDIRECT in the last 72 hours. Thyroid function studies No results for input(s): TSH, T4TOTAL, T3FREE, THYROIDAB in the last 72 hours.  Invalid input(s): FREET3 Anemia work up No results for input(s): VITAMINB12, FOLATE, FERRITIN, TIBC, IRON , RETICCTPCT in the last 72 hours. Urinalysis    Component Value Date/Time   COLORURINE AMBER (A) 06/18/2024 0358   APPEARANCEUR CLOUDY (A) 06/18/2024 0358   APPEARANCEUR Cloudy (A) 06/07/2019 1409   LABSPEC 1.018 06/18/2024 0358   PHURINE 5.0 06/18/2024 0358   GLUCOSEU NEGATIVE 06/18/2024 0358   HGBUR MODERATE (A) 06/18/2024 0358   BILIRUBINUR NEGATIVE 06/18/2024 0358   BILIRUBINUR Negative 06/07/2019 1409   KETONESUR NEGATIVE 06/18/2024 0358   PROTEINUR >=300 (A) 06/18/2024 0358   NITRITE NEGATIVE 06/18/2024 0358   LEUKOCYTESUR MODERATE (A) 06/18/2024 0358   Sepsis Labs Recent Labs  Lab 06/20/24 0530 06/21/24 0402 06/22/24 0418 06/23/24 0515  WBC 5.1 3.9* 3.7* 5.0    Microbiology Recent Results (  from the past 240 hours)  Blood Culture (routine x 2)     Status: Abnormal   Collection Time: 06/18/24  3:58 AM   Specimen: BLOOD  Result Value Ref Range Status   Specimen Description   Final    BLOOD BLOOD LEFT ARM Performed at [facility], [address], [place], [place] [address]    Special Requests   Final    BOTTLES DRAWN AEROBIC AND ANAEROBIC Blood Culture adequate volume Performed at [facility], [address] [place], [place] [address]    Culture  Setup Time   Final    GRAM POSITIVE COCCI AEROBIC BOTTLE ONLY CRITICAL VALUE NOTED.  VALUE IS CONSISTENT WITH PREVIOUSLY REPORTED AND CALLED VALUE. Performed at [facility], [address] [place], [place] [address]    Culture STAPHYLOCOCCUS CAPITIS (A)  Final   Report Status 06/22/2024 FINAL  Final   Organism ID, Bacteria STAPHYLOCOCCUS CAPITIS  Final      Susceptibility   Staphylococcus capitis - MIC*    CIPROFLOXACIN  <=0.5 SENSITIVE Sensitive     ERYTHROMYCIN <=0.25 SENSITIVE Sensitive     GENTAMICIN  <=0.5 SENSITIVE Sensitive     OXACILLIN <=0.25 SENSITIVE Sensitive     TETRACYCLINE <=1 SENSITIVE Sensitive     VANCOMYCIN  <=0.5 SENSITIVE Sensitive     TRIMETH /SULFA  <=10 SENSITIVE Sensitive     CLINDAMYCIN <=0.25 SENSITIVE Sensitive     RIFAMPIN <=0.5 SENSITIVE Sensitive     Inducible Clindamycin NEGATIVE Sensitive     * STAPHYLOCOCCUS CAPITIS  Blood Culture (routine x 2)     Status: Abnormal   Collection Time: 06/18/24  3:58 AM   Specimen: BLOOD  Result Value Ref Range Status   Specimen Description   Final    BLOOD BLOOD RIGHT ARM Performed at [facility], [address] [place], [place] [address]    Special Requests   Final    BOTTLES DRAWN AEROBIC AND ANAEROBIC Blood Culture adequate volume Performed at [facility], [address] [place], [place] [address]    Culture  Setup Time   Final    GRAM POSITIVE COCCI IN BOTH  AEROBIC AND ANAEROBIC BOTTLES CRITICAL RESULT CALLED TO, READ BACK BY AND VERIFIED WITH: JASON ROBBINS, PHARMD @0411  06/19/2024 COP    Culture (A)  Final    STAPHYLOCOCCUS LUGDUNENSIS STAPHYLOCOCCUS EPIDERMIDIS STAPHYLOCOCCUS CAPITIS SUSCEPTIBILITIES PERFORMED ON PREVIOUS CULTURE WITHIN THE LAST 5 DAYS. Performed at [facility], [address] [place], [place] [address]    Report Status 06/22/2024 FINAL  Final   Organism ID, Bacteria STAPHYLOCOCCUS LUGDUNENSIS  Final   Organism ID, Bacteria STAPHYLOCOCCUS EPIDERMIDIS  Final      Susceptibility   Staphylococcus epidermidis - MIC*    CIPROFLOXACIN  <=0.5 SENSITIVE Sensitive     ERYTHROMYCIN <=0.25 SENSITIVE Sensitive     GENTAMICIN  <=0.5 SENSITIVE Sensitive     OXACILLIN <=0.25 SENSITIVE Sensitive     TETRACYCLINE <=1 SENSITIVE Sensitive     VANCOMYCIN  1 SENSITIVE Sensitive     TRIMETH /SULFA  <=10 SENSITIVE Sensitive     CLINDAMYCIN <=0.25 SENSITIVE Sensitive     RIFAMPIN <=0.5 SENSITIVE Sensitive     Inducible Clindamycin NEGATIVE Sensitive     * STAPHYLOCOCCUS EPIDERMIDIS   Staphylococcus lugdunensis - MIC*    CIPROFLOXACIN  <=0.5 SENSITIVE Sensitive     ERYTHROMYCIN <=0.25 SENSITIVE Sensitive     GENTAMICIN  <=0.5 SENSITIVE Sensitive     OXACILLIN >=4 RESISTANT Resistant     TETRACYCLINE <=1 SENSITIVE Sensitive  VANCOMYCIN  <=0.5 SENSITIVE Sensitive     TRIMETH /SULFA  <=10 SENSITIVE Sensitive     CLINDAMYCIN <=0.25 SENSITIVE Sensitive     RIFAMPIN <=0.5 SENSITIVE Sensitive     Inducible Clindamycin NEGATIVE Sensitive     * STAPHYLOCOCCUS LUGDUNENSIS  Resp panel by RT-PCR (RSV, Flu A&B, Covid) Urine, Clean Catch     Status: None   Collection Time: 06/18/24  3:58 AM   Specimen: Urine, Clean Catch; Nasal Swab  Result Value Ref Range Status   SARS Coronavirus 2 by RT PCR NEGATIVE NEGATIVE Final    Comment: (NOTE) SARS-CoV-2 target nucleic acids are NOT DETECTED.  The SARS-CoV-2 RNA is generally detectable in upper  respiratory specimens during the acute phase of infection. The lowest concentration of SARS-CoV-2 viral copies this assay can detect is 138 copies/mL. A negative result does not preclude SARS-Cov-2 infection and should not be used as the sole basis for treatment or other patient management decisions. A negative result may occur with  improper specimen collection/handling, submission of specimen other than nasopharyngeal swab, presence of viral mutation(s) within the areas targeted by this assay, and inadequate number of viral copies(<138 copies/mL). A negative result must be combined with clinical observations, patient history, and epidemiological information. The expected result is Negative.  Fact Sheet for Patients:  bloggercourse.com  Fact Sheet for Healthcare Providers:  seriousbroker.it  This test is no t yet approved or cleared by the [place]  FDA and  has been authorized for detection and/or diagnosis of SARS-CoV-2 by FDA under an Emergency Use Authorization (EUA). This EUA will remain  in effect (meaning this test can be used) for the duration of the COVID-19 declaration under Section 564(b)(1) of the Act, 21 U.S.C.section 360bbb-3(b)(1), unless the authorization is terminated  or revoked sooner.       Influenza A by PCR NEGATIVE NEGATIVE Final   Influenza B by PCR NEGATIVE NEGATIVE Final    Comment: (NOTE) The Xpert Xpress SARS-CoV-2/FLU/RSV plus assay is intended as an aid in the diagnosis of influenza from Nasopharyngeal swab specimens and should not be used as a sole basis for treatment. Nasal washings and aspirates are unacceptable for Xpert Xpress SARS-CoV-2/FLU/RSV testing.  Fact Sheet for Patients: bloggercourse.com  Fact Sheet for Healthcare Providers: seriousbroker.it  This test is not yet approved or cleared by the [place]  FDA and has been  authorized for detection and/or diagnosis of SARS-CoV-2 by FDA under an Emergency Use Authorization (EUA). This EUA will remain in effect (meaning this test can be used) for the duration of the COVID-19 declaration under Section 564(b)(1) of the Act, 21 U.S.C. section 360bbb-3(b)(1), unless the authorization is terminated or revoked.     Resp Syncytial Virus by PCR NEGATIVE NEGATIVE Final    Comment: (NOTE) Fact Sheet for Patients: bloggercourse.com  Fact Sheet for Healthcare Providers: seriousbroker.it  This test is not yet approved or cleared by the [place]  FDA and has been authorized for detection and/or diagnosis of SARS-CoV-2 by FDA under an Emergency Use Authorization (EUA). This EUA will remain in effect (meaning this test can be used) for the duration of the COVID-19 declaration under Section 564(b)(1) of the Act, 21 U.S.C. section 360bbb-3(b)(1), unless the authorization is terminated or revoked.  Performed at [facility], [address] [place], [place] [address]   Urine Culture     Status: Abnormal   Collection Time: 06/18/24  3:58 AM   Specimen: Urine, Random  Result Value Ref Range Status   Specimen Description   Final  URINE, RANDOM Performed at [facility], [address] [place], [place] [address]    Special Requests   Final    NONE Reflexed from [id number] Performed at [facility], [address] [place], [place] [address]    Culture >=100,000 COLONIES/mL KLEBSIELLA AEROGENES (A)  Final   Report Status 06/22/2024 FINAL  Final   Organism ID, Bacteria KLEBSIELLA AEROGENES (A)  Final      Susceptibility   Klebsiella aerogenes - MIC*    CEFEPIME  <=0.12 SENSITIVE Sensitive     ERTAPENEM <=0.12 SENSITIVE Sensitive     CEFTRIAXONE  <=0.25 SENSITIVE Sensitive     CIPROFLOXACIN  <=0.06 SENSITIVE Sensitive     GENTAMICIN  <=1 SENSITIVE Sensitive     NITROFURANTOIN  128  RESISTANT Resistant     TRIMETH /SULFA  <=20 SENSITIVE Sensitive     PIP/TAZO Value in next row Sensitive      <=4 SENSITIVEThis is a modified FDA-approved test that has been validated and its performance characteristics determined by the reporting laboratory.  This laboratory is certified under the Clinical Laboratory Improvement Amendments CLIA as qualified to perform high complexity clinical laboratory testing.    MEROPENEM Value in next row Sensitive      <=4 SENSITIVEThis is a modified FDA-approved test that has been validated and its performance characteristics determined by the reporting laboratory.  This laboratory is certified under the Clinical Laboratory Improvement Amendments CLIA as qualified to perform high complexity clinical laboratory testing.    * >=100,000 COLONIES/mL KLEBSIELLA AEROGENES  Blood Culture ID Panel (Reflexed)     Status: Abnormal   Collection Time: 06/18/24  3:58 AM  Result Value Ref Range Status   Enterococcus faecalis NOT DETECTED NOT DETECTED Final   Enterococcus Faecium NOT DETECTED NOT DETECTED Final   Listeria monocytogenes NOT DETECTED NOT DETECTED Final   Staphylococcus species DETECTED (A) NOT DETECTED Final    Comment: CRITICAL RESULT CALLED TO, READ BACK BY AND VERIFIED WITH: JASON ROBBINS, PHARMD @0411  06/19/2024 COP    Staphylococcus aureus (BCID) NOT DETECTED NOT DETECTED Final   Staphylococcus epidermidis DETECTED (A) NOT DETECTED Final    Comment: CRITICAL RESULT CALLED TO, READ BACK BY AND VERIFIED WITH: JASON ROBBINS, PHARMD @0411  06/19/2024 COP    Staphylococcus lugdunensis NOT DETECTED NOT DETECTED Final   Streptococcus species NOT DETECTED NOT DETECTED Final   Streptococcus agalactiae NOT DETECTED NOT DETECTED Final   Streptococcus pneumoniae NOT DETECTED NOT DETECTED Final   Streptococcus pyogenes NOT DETECTED NOT DETECTED Final   A.calcoaceticus-baumannii NOT DETECTED NOT DETECTED Final   Bacteroides fragilis NOT DETECTED NOT DETECTED  Final   Enterobacterales NOT DETECTED NOT DETECTED Final   Enterobacter cloacae complex NOT DETECTED NOT DETECTED Final   Escherichia coli NOT DETECTED NOT DETECTED Final   Klebsiella aerogenes NOT DETECTED NOT DETECTED Final   Klebsiella oxytoca NOT DETECTED NOT DETECTED Final   Klebsiella pneumoniae NOT DETECTED NOT DETECTED Final   Proteus species NOT DETECTED NOT DETECTED Final   Salmonella species NOT DETECTED NOT DETECTED Final   Serratia marcescens NOT DETECTED NOT DETECTED Final   Haemophilus influenzae NOT DETECTED NOT DETECTED Final   Neisseria meningitidis NOT DETECTED NOT DETECTED Final   Pseudomonas aeruginosa NOT DETECTED NOT DETECTED Final   Stenotrophomonas maltophilia NOT DETECTED NOT DETECTED Final   Candida albicans NOT DETECTED NOT DETECTED Final   Candida auris NOT DETECTED NOT DETECTED Final   Candida glabrata NOT DETECTED NOT DETECTED Final   Candida krusei NOT DETECTED NOT DETECTED Final   Candida parapsilosis  NOT DETECTED NOT DETECTED Final   Candida tropicalis NOT DETECTED NOT DETECTED Final   Cryptococcus neoformans/gattii NOT DETECTED NOT DETECTED Final   Methicillin resistance mecA/C NOT DETECTED NOT DETECTED Final    Comment: Performed at [facility], [address] [place], [place] [address]  C Difficile Quick Screen w PCR reflex     Status: None   Collection Time: 06/18/24 12:53 PM   Specimen: STOOL  Result Value Ref Range Status   C Diff antigen NEGATIVE NEGATIVE Final   C Diff toxin NEGATIVE NEGATIVE Final   C Diff interpretation No C. difficile detected.  Final    Comment: Performed at [facility], [address] [place], [place] [address]  Gastrointestinal Panel by PCR , Stool     Status: None   Collection Time: 06/19/24 12:53 PM   Specimen: Stool  Result Value Ref Range Status   Campylobacter species NOT DETECTED NOT DETECTED Final   Plesimonas shigelloides NOT DETECTED NOT DETECTED Final   Salmonella species NOT  DETECTED NOT DETECTED Final   Yersinia enterocolitica NOT DETECTED NOT DETECTED Final   Vibrio species NOT DETECTED NOT DETECTED Final   Vibrio cholerae NOT DETECTED NOT DETECTED Final   Enteroaggregative E coli (EAEC) NOT DETECTED NOT DETECTED Final   Enteropathogenic E coli (EPEC) NOT DETECTED NOT DETECTED Final   Enterotoxigenic E coli (ETEC) NOT DETECTED NOT DETECTED Final   Shiga like toxin producing E coli (STEC) NOT DETECTED NOT DETECTED Final   Shigella/Enteroinvasive E coli (EIEC) NOT DETECTED NOT DETECTED Final   Cryptosporidium NOT DETECTED NOT DETECTED Final   Cyclospora cayetanensis NOT DETECTED NOT DETECTED Final   Entamoeba histolytica NOT DETECTED NOT DETECTED Final   Giardia lamblia NOT DETECTED NOT DETECTED Final   Adenovirus F40/41 NOT DETECTED NOT DETECTED Final   Astrovirus NOT DETECTED NOT DETECTED Final   Norovirus GI/GII NOT DETECTED NOT DETECTED Final   Rotavirus A NOT DETECTED NOT DETECTED Final   Sapovirus (I, II, IV, and V) NOT DETECTED NOT DETECTED Final    Comment: Performed at [facility], [address] [place], [place] [address]  Culture, blood (Routine X 2) w Reflex to ID Panel     Status: None (Preliminary result)   Collection Time: 06/20/24  5:30 AM   Specimen: BLOOD  Result Value Ref Range Status   Specimen Description BLOOD BLOOD RIGHT ARM  Final   Special Requests   Final    BOTTLES DRAWN AEROBIC AND ANAEROBIC Blood Culture adequate volume   Culture   Final    NO GROWTH 3 DAYS Performed at [facility], [address] [place], [place] [address]    Report Status PENDING  Incomplete  Culture, blood (Routine X 2) w Reflex to ID Panel     Status: None (Preliminary result)   Collection Time: 06/20/24  5:36 AM   Specimen: BLOOD  Result Value Ref Range Status   Specimen Description BLOOD BLOOD RIGHT HAND  Final   Special Requests   Final    BOTTLES DRAWN AEROBIC AND ANAEROBIC Blood Culture adequate volume   Culture   Final     NO GROWTH 3 DAYS Performed at [facility], [address] [place], [place] [address]    Report Status PENDING  Incomplete   Imaging ECHO TEE Result Date: 06/22/2024    TRANSESOPHOGEAL ECHO REPORT   Patient Name:   Rishawn Walck [facility]. Date of Exam: 06/21/2024 Medical Rec #:  [id number]  Height:       72.0 in Accession #:    [id number]             Weight:       260.0 lb Date of Birth:  Jul 22, 1945              BSA:          2.382 m Patient Age:    78 years               BP:           134/68 mmHg Patient Gender: M                      HR:           95 bpm. Exam Location:  [facility] Procedure: Transesophageal Echo and Color Doppler (Both Spectral and Color Flow            Doppler were utilized during procedure). Indications:     Bacteremia  History:         Patient has prior history of Echocardiogram examinations, most                  recent 06/18/2024. 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  Sonographer:     Ashley McNeely-Sloane Referring Phys:  [id number] SHERI HAMMOCK Diagnosing Phys: Evalene Lunger MD PROCEDURE: After discussion of the risks and benefits of a TEE, an informed consent was obtained from the patient. The transesophogeal probe was passed without difficulty through the esophogus of the patient. Local oropharyngeal anesthetic was provided with viscous lidocaine  and Cetacaine . Sedation performed by different physician. Image quality was technically difficult. The patient's vital signs; including heart rate, blood pressure, and oxygen saturation; remained stable throughout the procedure. The patient developed no complications during the procedure.  IMPRESSIONS  1. Grossly no vegetation noted though bioprosthetic aortic valve not well visualized.  2. Left ventricular ejection fraction, by estimation, is 60 to 65%. The left ventricle has normal function. The left ventricle has no regional wall motion abnormalities.  3. Right ventricular systolic  function is normal. The right ventricular size is normal.  4. No left atrial/left atrial appendage thrombus was detected.  5. The mitral valve is normal in structure. Mild to moderate mitral valve regurgitation. No evidence of mitral stenosis.  6. The aortic valve was not well visualized. 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 Aortic valve regurgitation is not visualized. Unable to measure gradient to estimate degree of stenosis (consider alternate imaging modality such as gated cardiac  CTA if clinically indicated)  7. The inferior vena cava is normal in size with greater than 50% respiratory variability, suggesting right atrial pressure of 3 mmHg  8. FINDINGS  Left Ventricle: Left ventricular ejection fraction, by estimation, is 60 to 65%. The left ventricle has normal function. The left ventricle has no regional wall motion abnormalities. The left ventricular internal cavity size was normal in size. There is  no left ventricular hypertrophy. Right Ventricle: The right ventricular size is normal. No increase in right ventricular wall thickness. Right ventricular systolic function is normal. Left Atrium: Left atrial size was normal in size. No left atrial/left atrial appendage thrombus was detected. Right Atrium: Right atrial size was normal in size. Pericardium: There is no evidence of pericardial effusion. Mitral Valve: The mitral valve is normal in structure. Mild to moderate mitral  valve regurgitation. No evidence of mitral valve stenosis. Tricuspid Valve: The tricuspid valve is normal in structure. Tricuspid valve regurgitation is not demonstrated. No evidence of tricuspid stenosis. Aortic Valve: The aortic valve was not well visualized. Aortic valve regurgitation is not visualized. 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 Pulmonic Valve: The pulmonic valve was normal in structure. Pulmonic valve regurgitation is not visualized. No evidence of pulmonic stenosis. Aorta: The aortic  root is normal in size and structure. Venous: The inferior vena cava is normal in size with greater than 50% respiratory variability, suggesting right atrial pressure of 3 mmHg. IAS/Shunts: No atrial level shunt detected by color flow Doppler. Additional Comments: 3D was performed not requiring image post processing on an independent workstation and was indeterminate. Evalene Lunger MD Electronically signed by Evalene Lunger MD Signature Date/Time: 06/22/2024/5:08:27 PM    Final       Time coordinating discharge: over 30 minutes  SIGNED:  Laneta Blunt DO [facility]       [1]  Allergies Allergen Reactions   Metformin Diarrhea and Anaphylaxis    Loose stools even with XR

## 2024-06-25 LAB — CULTURE, BLOOD (ROUTINE X 2)
Culture: NO GROWTH
Culture: NO GROWTH
Special Requests: ADEQUATE
Special Requests: ADEQUATE

## 2024-07-31 ENCOUNTER — Other Ambulatory Visit: Payer: Self-pay | Admitting: Internal Medicine

## 2024-08-01 ENCOUNTER — Other Ambulatory Visit: Payer: Self-pay

## 2024-08-08 MED ORDER — RANOLAZINE ER 500 MG PO TB12: 500.0000 mg | ORAL_TABLET | Freq: Two times a day (BID) | ORAL | 3 refills | Status: AC

## 2024-08-29 ENCOUNTER — Ambulatory Visit: Admitting: Family Medicine
